# Patient Record
Sex: Female | Born: 1955 | Race: Black or African American | Hispanic: No | Smoking: Never smoker
Health system: Southern US, Community
[De-identification: ages and names within clinical notes are randomized; demographics above are authoritative.]

## PROBLEM LIST (undated history)

## (undated) DIAGNOSIS — IMO0002 Reserved for concepts with insufficient information to code with codable children: Secondary | ICD-10-CM

## (undated) DIAGNOSIS — L0232 Furuncle of buttock: Secondary | ICD-10-CM

## (undated) DIAGNOSIS — A599 Trichomoniasis, unspecified: Secondary | ICD-10-CM

## (undated) DIAGNOSIS — Z8719 Personal history of other diseases of the digestive system: Secondary | ICD-10-CM

## (undated) DIAGNOSIS — A0472 Enterocolitis due to Clostridium difficile, not specified as recurrent: Secondary | ICD-10-CM

## (undated) DIAGNOSIS — I499 Cardiac arrhythmia, unspecified: Secondary | ICD-10-CM

## (undated) DIAGNOSIS — L309 Dermatitis, unspecified: Secondary | ICD-10-CM

## (undated) DIAGNOSIS — R1032 Left lower quadrant pain: Secondary | ICD-10-CM

## (undated) DIAGNOSIS — R350 Frequency of micturition: Secondary | ICD-10-CM

## (undated) DIAGNOSIS — L0231 Cutaneous abscess of buttock: Secondary | ICD-10-CM

## (undated) DIAGNOSIS — G4730 Sleep apnea, unspecified: Secondary | ICD-10-CM

## (undated) DIAGNOSIS — C50919 Malignant neoplasm of unspecified site of unspecified female breast: Secondary | ICD-10-CM

## (undated) DIAGNOSIS — E785 Hyperlipidemia, unspecified: Secondary | ICD-10-CM

## (undated) DIAGNOSIS — T451X5A Adverse effect of antineoplastic and immunosuppressive drugs, initial encounter: Secondary | ICD-10-CM

## (undated) DIAGNOSIS — R32 Unspecified urinary incontinence: Secondary | ICD-10-CM

## (undated) DIAGNOSIS — K469 Unspecified abdominal hernia without obstruction or gangrene: Secondary | ICD-10-CM

## (undated) DIAGNOSIS — I82409 Acute embolism and thrombosis of unspecified deep veins of unspecified lower extremity: Secondary | ICD-10-CM

## (undated) DIAGNOSIS — R519 Headache, unspecified: Secondary | ICD-10-CM

## (undated) DIAGNOSIS — R21 Rash and other nonspecific skin eruption: Secondary | ICD-10-CM

## (undated) DIAGNOSIS — I10 Essential (primary) hypertension: Secondary | ICD-10-CM

## (undated) DIAGNOSIS — Z87898 Personal history of other specified conditions: Secondary | ICD-10-CM

## (undated) DIAGNOSIS — C541 Malignant neoplasm of endometrium: Secondary | ICD-10-CM

## (undated) DIAGNOSIS — Z86010 Personal history of colonic polyps: Secondary | ICD-10-CM

## (undated) DIAGNOSIS — T7840XA Allergy, unspecified, initial encounter: Secondary | ICD-10-CM

## (undated) DIAGNOSIS — E611 Iron deficiency: Secondary | ICD-10-CM

## (undated) DIAGNOSIS — G4733 Obstructive sleep apnea (adult) (pediatric): Secondary | ICD-10-CM

## (undated) DIAGNOSIS — R51 Headache: Secondary | ICD-10-CM

## (undated) DIAGNOSIS — R7303 Prediabetes: Secondary | ICD-10-CM

## (undated) DIAGNOSIS — K219 Gastro-esophageal reflux disease without esophagitis: Secondary | ICD-10-CM

## (undated) DIAGNOSIS — N6459 Other signs and symptoms in breast: Secondary | ICD-10-CM

## (undated) DIAGNOSIS — G629 Polyneuropathy, unspecified: Secondary | ICD-10-CM

## (undated) DIAGNOSIS — T148XXA Other injury of unspecified body region, initial encounter: Secondary | ICD-10-CM

## (undated) DIAGNOSIS — A6000 Herpesviral infection of urogenital system, unspecified: Secondary | ICD-10-CM

## (undated) DIAGNOSIS — IMO0001 Reserved for inherently not codable concepts without codable children: Secondary | ICD-10-CM

## (undated) DIAGNOSIS — E119 Type 2 diabetes mellitus without complications: Secondary | ICD-10-CM

## (undated) DIAGNOSIS — Z803 Family history of malignant neoplasm of breast: Secondary | ICD-10-CM

## (undated) DIAGNOSIS — Z8619 Personal history of other infectious and parasitic diseases: Secondary | ICD-10-CM

## (undated) DIAGNOSIS — M1990 Unspecified osteoarthritis, unspecified site: Secondary | ICD-10-CM

## (undated) DIAGNOSIS — I509 Heart failure, unspecified: Secondary | ICD-10-CM

## (undated) DIAGNOSIS — N920 Excessive and frequent menstruation with regular cycle: Secondary | ICD-10-CM

## (undated) DIAGNOSIS — D649 Anemia, unspecified: Secondary | ICD-10-CM

## (undated) DIAGNOSIS — A499 Bacterial infection, unspecified: Secondary | ICD-10-CM

## (undated) DIAGNOSIS — N318 Other neuromuscular dysfunction of bladder: Secondary | ICD-10-CM

## (undated) DIAGNOSIS — I427 Cardiomyopathy due to drug and external agent: Secondary | ICD-10-CM

## (undated) HISTORY — DX: Trichomoniasis, unspecified: A59.9

## (undated) HISTORY — DX: Personal history of other infectious and parasitic diseases: Z86.19

## (undated) HISTORY — DX: Sleep apnea, unspecified: G47.30

## (undated) HISTORY — DX: Other signs and symptoms in breast: N64.59

## (undated) HISTORY — DX: Malignant neoplasm of unspecified site of unspecified female breast: C50.919

## (undated) HISTORY — DX: Hyperlipidemia, unspecified: E78.5

## (undated) HISTORY — DX: Allergy, unspecified, initial encounter: T78.40XA

## (undated) HISTORY — DX: Family history of malignant neoplasm of breast: Z80.3

## (undated) HISTORY — DX: Type 2 diabetes mellitus without complications: E11.9

## (undated) HISTORY — DX: Enterocolitis due to Clostridium difficile, not specified as recurrent: A04.72

## (undated) HISTORY — DX: Bacterial infection, unspecified: A49.9

## (undated) HISTORY — DX: Cutaneous abscess of buttock: L02.31

## (undated) HISTORY — DX: Personal history of colonic polyps: Z86.010

## (undated) HISTORY — DX: Personal history of other diseases of the digestive system: Z87.19

## (undated) HISTORY — DX: Herpesviral infection of urogenital system, unspecified: A60.00

## (undated) HISTORY — DX: Prediabetes: R73.03

## (undated) HISTORY — PX: JOINT REPLACEMENT: SHX530

## (undated) HISTORY — DX: Unspecified abdominal hernia without obstruction or gangrene: K46.9

## (undated) HISTORY — PX: OTHER SURGICAL HISTORY: SHX169

## (undated) HISTORY — DX: Rash and other nonspecific skin eruption: R21

## (undated) HISTORY — DX: Polyneuropathy, unspecified: G62.9

## (undated) HISTORY — DX: Iron deficiency: E61.1

## (undated) HISTORY — DX: Essential (primary) hypertension: I10

## (undated) HISTORY — DX: Personal history of other specified conditions: Z87.898

## (undated) HISTORY — PX: DILATION AND CURETTAGE OF UTERUS: SHX78

## (undated) HISTORY — DX: Excessive and frequent menstruation with regular cycle: N92.0

## (undated) HISTORY — PX: ABDOMINAL HYSTERECTOMY: SHX81

## (undated) HISTORY — DX: Obstructive sleep apnea (adult) (pediatric): G47.33

## (undated) HISTORY — PX: COLONOSCOPY: SHX174

## (undated) HISTORY — DX: Left lower quadrant pain: R10.32

## (undated) HISTORY — PX: BREAST LUMPECTOMY: SHX2

## (undated) HISTORY — PX: AXILLARY LYMPH NODE DISSECTION: SHX5229

## (undated) HISTORY — DX: Malignant neoplasm of endometrium: C54.1

## (undated) HISTORY — DX: Other neuromuscular dysfunction of bladder: N31.8

---
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
## 1999-12-30 ENCOUNTER — Other Ambulatory Visit: Admission: RE | Admit: 1999-12-30 | Discharge: 1999-12-30 | Payer: Self-pay | Admitting: Internal Medicine

## 2002-10-06 ENCOUNTER — Ambulatory Visit (HOSPITAL_BASED_OUTPATIENT_CLINIC_OR_DEPARTMENT_OTHER): Admission: RE | Admit: 2002-10-06 | Discharge: 2002-10-06 | Payer: Self-pay | Admitting: Internal Medicine

## 2002-10-06 ENCOUNTER — Encounter: Payer: Self-pay | Admitting: Pulmonary Disease

## 2004-10-19 HISTORY — PX: UTERINE FIBROID SURGERY: SHX826

## 2004-10-19 HISTORY — PX: OTHER SURGICAL HISTORY: SHX169

## 2004-12-05 ENCOUNTER — Ambulatory Visit: Payer: Self-pay | Admitting: Internal Medicine

## 2004-12-10 ENCOUNTER — Ambulatory Visit (HOSPITAL_COMMUNITY): Admission: RE | Admit: 2004-12-10 | Discharge: 2004-12-10 | Payer: Self-pay | Admitting: Internal Medicine

## 2005-01-05 ENCOUNTER — Ambulatory Visit: Payer: Self-pay | Admitting: Pulmonary Disease

## 2005-01-20 ENCOUNTER — Ambulatory Visit (HOSPITAL_COMMUNITY): Admission: RE | Admit: 2005-01-20 | Discharge: 2005-01-20 | Payer: Self-pay | Admitting: Obstetrics and Gynecology

## 2005-03-26 ENCOUNTER — Other Ambulatory Visit: Admission: RE | Admit: 2005-03-26 | Discharge: 2005-03-26 | Payer: Self-pay | Admitting: Obstetrics and Gynecology

## 2005-05-22 ENCOUNTER — Ambulatory Visit (HOSPITAL_COMMUNITY): Admission: RE | Admit: 2005-05-22 | Discharge: 2005-05-22 | Payer: Self-pay | Admitting: Obstetrics and Gynecology

## 2005-06-05 ENCOUNTER — Ambulatory Visit (HOSPITAL_COMMUNITY): Admission: RE | Admit: 2005-06-05 | Discharge: 2005-06-05 | Payer: Self-pay | Admitting: Obstetrics and Gynecology

## 2005-08-21 ENCOUNTER — Inpatient Hospital Stay (HOSPITAL_COMMUNITY): Admission: RE | Admit: 2005-08-21 | Discharge: 2005-08-23 | Payer: Self-pay | Admitting: General Surgery

## 2005-08-21 ENCOUNTER — Encounter (INDEPENDENT_AMBULATORY_CARE_PROVIDER_SITE_OTHER): Payer: Self-pay | Admitting: Specialist

## 2005-10-19 HISTORY — PX: OTHER SURGICAL HISTORY: SHX169

## 2006-01-12 ENCOUNTER — Ambulatory Visit: Payer: Self-pay | Admitting: Internal Medicine

## 2006-01-27 ENCOUNTER — Ambulatory Visit: Payer: Self-pay | Admitting: Gastroenterology

## 2006-02-05 ENCOUNTER — Ambulatory Visit: Payer: Self-pay | Admitting: Gastroenterology

## 2006-02-08 ENCOUNTER — Ambulatory Visit: Payer: Self-pay | Admitting: Gastroenterology

## 2006-02-12 ENCOUNTER — Ambulatory Visit: Payer: Self-pay | Admitting: Gastroenterology

## 2006-02-12 LAB — HM COLONOSCOPY: HM Colonoscopy: NORMAL

## 2006-02-26 ENCOUNTER — Ambulatory Visit: Payer: Self-pay | Admitting: Gastroenterology

## 2006-06-18 ENCOUNTER — Ambulatory Visit: Payer: Self-pay | Admitting: Internal Medicine

## 2006-09-16 ENCOUNTER — Inpatient Hospital Stay (HOSPITAL_COMMUNITY): Admission: RE | Admit: 2006-09-16 | Discharge: 2006-09-20 | Payer: Self-pay | Admitting: Specialist

## 2007-03-10 ENCOUNTER — Ambulatory Visit: Payer: Self-pay | Admitting: Internal Medicine

## 2007-03-10 LAB — CONVERTED CEMR LAB
ALT: 16 units/L (ref 0–40)
AST: 22 units/L (ref 0–37)
Albumin: 3.5 g/dL (ref 3.5–5.2)
Alkaline Phosphatase: 52 units/L (ref 39–117)
BUN: 9 mg/dL (ref 6–23)
Basophils Absolute: 0 10*3/uL (ref 0.0–0.1)
Basophils Relative: 0.4 % (ref 0.0–1.0)
Bilirubin Urine: NEGATIVE
Bilirubin, Direct: 0.2 mg/dL (ref 0.0–0.3)
CO2: 31 meq/L (ref 19–32)
Calcium: 9.4 mg/dL (ref 8.4–10.5)
Chloride: 103 meq/L (ref 96–112)
Cholesterol: 193 mg/dL (ref 0–200)
Creatinine, Ser: 0.8 mg/dL (ref 0.4–1.2)
Crystals: NEGATIVE
Eosinophils Absolute: 0.1 10*3/uL (ref 0.0–0.6)
Eosinophils Relative: 1.6 % (ref 0.0–5.0)
GFR calc Af Amer: 98 mL/min
GFR calc non Af Amer: 81 mL/min
Glucose, Bld: 103 mg/dL — ABNORMAL HIGH (ref 70–99)
HCT: 33.8 % — ABNORMAL LOW (ref 36.0–46.0)
HDL: 61.2 mg/dL (ref >39.0)
Hemoglobin: 11.4 g/dL — ABNORMAL LOW (ref 12.0–15.0)
Ketones, ur: NEGATIVE mg/dL
LDL Cholesterol: 118 mg/dL — ABNORMAL HIGH (ref 0–99)
Leukocytes, UA: NEGATIVE
Lymphocytes Relative: 28 % (ref 12.0–46.0)
MCHC: 33.7 g/dL (ref 30.0–36.0)
MCV: 85.4 fL (ref 78.0–100.0)
Monocytes Absolute: 0.5 10*3/uL (ref 0.2–0.7)
Monocytes Relative: 8.9 % (ref 3.0–11.0)
Neutro Abs: 3.4 10*3/uL (ref 1.4–7.7)
Neutrophils Relative %: 61.1 % (ref 43.0–77.0)
Nitrite: NEGATIVE
Platelets: 328 10*3/uL (ref 150–400)
Potassium: 3.9 meq/L (ref 3.5–5.1)
RBC: 3.96 M/uL (ref 3.87–5.11)
RDW: 13.8 % (ref 11.5–14.6)
Sodium: 139 meq/L (ref 135–145)
Specific Gravity, Urine: 1.02 (ref 1.000–1.03)
TSH: 1.47 microintl units/mL (ref 0.35–5.50)
Total Bilirubin: 0.6 mg/dL (ref 0.3–1.2)
Total CHOL/HDL Ratio: 3.2
Total Protein, Urine: NEGATIVE mg/dL
Total Protein: 7.9 g/dL (ref 6.0–8.3)
Triglycerides: 70 mg/dL (ref 0–149)
Urine Glucose: NEGATIVE mg/dL
Urobilinogen, UA: 0.2 (ref 0.0–1.0)
VLDL: 14 mg/dL (ref 0–40)
WBC: 5.6 10*3/uL (ref 4.5–10.5)
pH: 6 (ref 5.0–8.0)

## 2007-03-11 ENCOUNTER — Encounter: Payer: Self-pay | Admitting: Internal Medicine

## 2007-04-13 ENCOUNTER — Encounter: Admission: RE | Admit: 2007-04-13 | Discharge: 2007-04-13 | Payer: Self-pay | Admitting: Specialist

## 2008-05-11 ENCOUNTER — Ambulatory Visit: Payer: Self-pay | Admitting: Internal Medicine

## 2008-05-11 ENCOUNTER — Encounter (INDEPENDENT_AMBULATORY_CARE_PROVIDER_SITE_OTHER): Payer: Self-pay | Admitting: *Deleted

## 2008-05-11 DIAGNOSIS — E785 Hyperlipidemia, unspecified: Secondary | ICD-10-CM | POA: Insufficient documentation

## 2008-05-11 DIAGNOSIS — Z860100 Personal history of colon polyps, unspecified: Secondary | ICD-10-CM

## 2008-05-11 DIAGNOSIS — D509 Iron deficiency anemia, unspecified: Secondary | ICD-10-CM | POA: Insufficient documentation

## 2008-05-11 DIAGNOSIS — G4733 Obstructive sleep apnea (adult) (pediatric): Secondary | ICD-10-CM

## 2008-05-11 DIAGNOSIS — K219 Gastro-esophageal reflux disease without esophagitis: Secondary | ICD-10-CM | POA: Insufficient documentation

## 2008-05-11 HISTORY — DX: Personal history of colonic polyps: Z86.010

## 2008-05-11 HISTORY — DX: Personal history of colon polyps, unspecified: Z86.0100

## 2008-05-11 HISTORY — DX: Hyperlipidemia, unspecified: E78.5

## 2008-05-11 HISTORY — DX: Obstructive sleep apnea (adult) (pediatric): G47.33

## 2008-05-11 LAB — CONVERTED CEMR LAB
ALT: 20 units/L (ref 0–35)
ALT: 20 units/L (ref 0–35)
AST: 22 units/L (ref 0–37)
AST: 22 units/L (ref 0–37)
Albumin: 3.8 g/dL (ref 3.5–5.2)
Albumin: 3.8 g/dL (ref 3.5–5.2)
Alkaline Phosphatase: 43 units/L (ref 39–117)
Alkaline Phosphatase: 43 units/L (ref 39–117)
BUN: 9 mg/dL (ref 6–23)
BUN: 9 mg/dL (ref 6–23)
Basophils Absolute: 0.1 10*3/uL (ref 0.0–0.1)
Basophils Absolute: 0.1 10*3/uL (ref 0.0–0.1)
Basophils Relative: 1 % (ref 0.0–3.0)
Basophils Relative: 1 % (ref 0.0–3.0)
Bilirubin Urine: NEGATIVE
Bilirubin Urine: NEGATIVE
Bilirubin, Direct: 0.1 mg/dL (ref 0.0–0.3)
Bilirubin, Direct: 0.1 mg/dL (ref 0.0–0.3)
CO2: 28 meq/L (ref 19–32)
CO2: 28 meq/L (ref 19–32)
Calcium: 9 mg/dL (ref 8.4–10.5)
Calcium: 9 mg/dL (ref 8.4–10.5)
Chloride: 101 meq/L (ref 96–112)
Chloride: 101 meq/L (ref 96–112)
Cholesterol: 168 mg/dL (ref 0–200)
Cholesterol: 168 mg/dL (ref 0–200)
Creatinine, Ser: 0.7 mg/dL (ref 0.4–1.2)
Creatinine, Ser: 0.7 mg/dL (ref 0.4–1.2)
Crystals: NEGATIVE
Eosinophils Absolute: 0.1 10*3/uL (ref 0.0–0.7)
Eosinophils Absolute: 0.1 10*3/uL (ref 0.0–0.7)
Eosinophils Relative: 2.3 % (ref 0.0–5.0)
Eosinophils Relative: 2.3 % (ref 0.0–5.0)
GFR calc Af Amer: 114 mL/min
GFR calc Af Amer: 114 mL/min
GFR calc non Af Amer: 95 mL/min
GFR calc non Af Amer: 95 mL/min
Glucose, Bld: 87 mg/dL (ref 70–99)
Glucose, Bld: 87 mg/dL (ref 70–99)
HCT: 32.6 % — ABNORMAL LOW (ref 36.0–46.0)
HCT: 32.6 % — ABNORMAL LOW (ref 36.0–46.0)
HDL: 48.7 mg/dL (ref >39.0)
HDL: 48.7 mg/dL (ref >39.0)
Hemoglobin, Urine: NEGATIVE
Hemoglobin: 10.8 g/dL — ABNORMAL LOW (ref 12.0–15.0)
Hemoglobin: 10.8 g/dL — ABNORMAL LOW (ref 12.0–15.0)
Ketones, ur: NEGATIVE mg/dL
Ketones, ur: NEGATIVE mg/dL
LDL Cholesterol: 99 mg/dL (ref 0–99)
LDL Cholesterol: 99 mg/dL (ref 0–99)
Leukocytes, UA: NEGATIVE
Leukocytes, UA: NEGATIVE
Lymphocytes Relative: 33 % (ref 12.0–46.0)
Lymphocytes Relative: 33 % (ref 12.0–46.0)
MCHC: 33.1 g/dL (ref 30.0–36.0)
MCHC: 33.1 g/dL (ref 30.0–36.0)
MCV: 90 fL (ref 78.0–100.0)
MCV: 90 fL (ref 78.0–100.0)
Monocytes Absolute: 0.9 10*3/uL (ref 0.1–1.0)
Monocytes Absolute: 0.9 10*3/uL (ref 0.1–1.0)
Monocytes Relative: 13.5 % — ABNORMAL HIGH (ref 3.0–12.0)
Monocytes Relative: 13.5 % — ABNORMAL HIGH (ref 3.0–12.0)
Mucus, UA: NEGATIVE
Neutro Abs: 3.2 10*3/uL (ref 1.4–7.7)
Neutro Abs: 3.2 10*3/uL (ref 1.4–7.7)
Neutrophils Relative %: 50.2 % (ref 43.0–77.0)
Neutrophils Relative %: 50.2 % (ref 43.0–77.0)
Nitrite: NEGATIVE
Nitrite: NEGATIVE
Platelets: 296 10*3/uL (ref 150–400)
Platelets: 296 10*3/uL (ref 150–400)
Potassium: 3.6 meq/L (ref 3.5–5.1)
Potassium: 3.6 meq/L (ref 3.5–5.1)
RBC: 3.63 M/uL — ABNORMAL LOW (ref 3.87–5.11)
RBC: 3.63 M/uL — ABNORMAL LOW (ref 3.87–5.11)
RDW: 13 % (ref 11.5–14.6)
RDW: 13 % (ref 11.5–14.6)
Sodium: 139 meq/L (ref 135–145)
Sodium: 139 meq/L (ref 135–145)
Specific Gravity, Urine: 1.02 (ref 1.000–1.03)
Specific Gravity, Urine: 1.02 (ref 1.000–1.03)
TSH: 1.31 microintl units/mL (ref 0.35–5.50)
TSH: 1.31 microintl units/mL (ref 0.35–5.50)
Total Bilirubin: 0.5 mg/dL (ref 0.3–1.2)
Total Bilirubin: 0.5 mg/dL (ref 0.3–1.2)
Total CHOL/HDL Ratio: 3.4
Total CHOL/HDL Ratio: 3.4
Total Protein, Urine: NEGATIVE mg/dL
Total Protein, Urine: NEGATIVE mg/dL
Total Protein: 7.9 g/dL (ref 6.0–8.3)
Total Protein: 7.9 g/dL (ref 6.0–8.3)
Triglycerides: 102 mg/dL (ref 0–149)
Triglycerides: 102 mg/dL (ref 0–149)
Urine Glucose: NEGATIVE mg/dL
Urine Glucose: NEGATIVE mg/dL
Urobilinogen, UA: 1 (ref 0.0–1.0)
Urobilinogen, UA: 1 (ref 0.0–1.0)
VLDL: 20 mg/dL (ref 0–40)
VLDL: 20 mg/dL (ref 0–40)
WBC: 6.4 10*3/uL (ref 4.5–10.5)
WBC: 6.4 10*3/uL (ref 4.5–10.5)
pH: 6 (ref 5.0–8.0)
pH: 6 (ref 5.0–8.0)

## 2008-05-14 LAB — CONVERTED CEMR LAB: Vit D, 1,25-Dihydroxy: 19 — ABNORMAL LOW (ref 30–89)

## 2008-06-29 ENCOUNTER — Ambulatory Visit: Payer: Self-pay | Admitting: Internal Medicine

## 2008-06-29 DIAGNOSIS — R21 Rash and other nonspecific skin eruption: Secondary | ICD-10-CM

## 2008-06-29 DIAGNOSIS — N318 Other neuromuscular dysfunction of bladder: Secondary | ICD-10-CM | POA: Insufficient documentation

## 2008-06-29 HISTORY — DX: Other neuromuscular dysfunction of bladder: N31.8

## 2008-06-29 HISTORY — DX: Rash and other nonspecific skin eruption: R21

## 2008-08-13 LAB — HM MAMMOGRAPHY: HM Mammogram: NORMAL

## 2008-11-30 ENCOUNTER — Encounter: Admission: RE | Admit: 2008-11-30 | Discharge: 2008-11-30 | Payer: Self-pay | Admitting: Obstetrics and Gynecology

## 2009-01-24 ENCOUNTER — Encounter: Payer: Self-pay | Admitting: Internal Medicine

## 2009-02-28 ENCOUNTER — Telehealth: Payer: Self-pay | Admitting: Cardiovascular Disease

## 2009-07-26 ENCOUNTER — Ambulatory Visit: Payer: Self-pay | Admitting: Internal Medicine

## 2009-07-30 ENCOUNTER — Telehealth: Payer: Self-pay | Admitting: Internal Medicine

## 2009-07-30 ENCOUNTER — Ambulatory Visit: Payer: Self-pay | Admitting: Internal Medicine

## 2009-07-30 LAB — CONVERTED CEMR LAB
ALT: 17 units/L (ref 0–35)
AST: 19 units/L (ref 0–37)
Albumin: 3.6 g/dL (ref 3.5–5.2)
Alkaline Phosphatase: 41 units/L (ref 39–117)
BUN: 10 mg/dL (ref 6–23)
Basophils Absolute: 0 10*3/uL (ref 0.0–0.1)
Basophils Relative: 0.6 % (ref 0.0–3.0)
Bilirubin Urine: NEGATIVE
Bilirubin, Direct: 0.1 mg/dL (ref 0.0–0.3)
CO2: 22 meq/L (ref 19–32)
Calcium: 9.2 mg/dL (ref 8.4–10.5)
Chloride: 105 meq/L (ref 96–112)
Cholesterol: 158 mg/dL (ref 0–200)
Creatinine, Ser: 0.7 mg/dL (ref 0.4–1.2)
Eosinophils Absolute: 0.1 10*3/uL (ref 0.0–0.7)
Eosinophils Relative: 1.3 % (ref 0.0–5.0)
GFR calc non Af Amer: 113.53 mL/min (ref >60)
Glucose, Bld: 86 mg/dL (ref 70–99)
HCT: 32.9 % — ABNORMAL LOW (ref 36.0–46.0)
HDL: 57.7 mg/dL (ref >39.00)
Hemoglobin: 10.9 g/dL — ABNORMAL LOW (ref 12.0–15.0)
Ketones, ur: NEGATIVE mg/dL
LDL Cholesterol: 87 mg/dL (ref 0–99)
Leukocytes, UA: NEGATIVE
Lymphocytes Relative: 27.8 % (ref 12.0–46.0)
Lymphs Abs: 1.8 10*3/uL (ref 0.7–4.0)
MCHC: 33.1 g/dL (ref 30.0–36.0)
MCV: 86.8 fL (ref 78.0–100.0)
Monocytes Absolute: 0.6 10*3/uL (ref 0.1–1.0)
Monocytes Relative: 8.8 % (ref 3.0–12.0)
Neutro Abs: 3.9 10*3/uL (ref 1.4–7.7)
Neutrophils Relative %: 61.5 % (ref 43.0–77.0)
Nitrite: NEGATIVE
Platelets: 270 10*3/uL (ref 150.0–400.0)
Potassium: 4.3 meq/L (ref 3.5–5.1)
RBC: 3.79 M/uL — ABNORMAL LOW (ref 3.87–5.11)
RDW: 14.2 % (ref 11.5–14.6)
Sodium: 140 meq/L (ref 135–145)
Specific Gravity, Urine: 1.025 (ref 1.000–1.030)
TSH: 1.73 microintl units/mL (ref 0.35–5.50)
Total Bilirubin: 0.6 mg/dL (ref 0.3–1.2)
Total CHOL/HDL Ratio: 3
Total Protein, Urine: NEGATIVE mg/dL
Total Protein: 7.2 g/dL (ref 6.0–8.3)
Triglycerides: 65 mg/dL (ref 0.0–149.0)
Urine Glucose: NEGATIVE mg/dL
Urobilinogen, UA: 0.2 (ref 0.0–1.0)
VLDL: 13 mg/dL (ref 0.0–40.0)
WBC: 6.4 10*3/uL (ref 4.5–10.5)
pH: 5.5 (ref 5.0–8.0)

## 2009-07-31 LAB — CONVERTED CEMR LAB
Folate: 7.4 ng/mL
Iron: 46 ug/dL (ref 42–145)
Saturation Ratios: 13.5 % — ABNORMAL LOW (ref 20.0–50.0)
Transferrin: 243.6 mg/dL (ref 212.0–360.0)
Vitamin B-12: 459 pg/mL (ref 211–911)

## 2009-08-08 ENCOUNTER — Ambulatory Visit: Payer: Self-pay | Admitting: Pulmonary Disease

## 2009-08-08 DIAGNOSIS — Z87898 Personal history of other specified conditions: Secondary | ICD-10-CM

## 2009-08-08 HISTORY — DX: Personal history of other specified conditions: Z87.898

## 2009-09-06 ENCOUNTER — Ambulatory Visit: Payer: Self-pay | Admitting: Pulmonary Disease

## 2009-10-26 ENCOUNTER — Encounter: Payer: Self-pay | Admitting: Pulmonary Disease

## 2009-12-16 ENCOUNTER — Ambulatory Visit: Payer: Self-pay | Admitting: Pulmonary Disease

## 2009-12-25 DIAGNOSIS — N920 Excessive and frequent menstruation with regular cycle: Secondary | ICD-10-CM | POA: Insufficient documentation

## 2010-10-19 DIAGNOSIS — Z171 Estrogen receptor negative status [ER-]: Secondary | ICD-10-CM | POA: Insufficient documentation

## 2010-10-19 DIAGNOSIS — C50919 Malignant neoplasm of unspecified site of unspecified female breast: Secondary | ICD-10-CM

## 2010-10-19 DIAGNOSIS — C50412 Malignant neoplasm of upper-outer quadrant of left female breast: Secondary | ICD-10-CM | POA: Insufficient documentation

## 2010-10-19 HISTORY — DX: Malignant neoplasm of unspecified site of unspecified female breast: C50.919

## 2010-11-09 ENCOUNTER — Encounter: Payer: Self-pay | Admitting: Obstetrics and Gynecology

## 2010-11-16 LAB — CONVERTED CEMR LAB
Bilirubin Urine: NEGATIVE
Crystals: NEGATIVE
Ketones, ur: NEGATIVE mg/dL
Leukocytes, UA: NEGATIVE
Mucus, UA: NEGATIVE
Nitrite: NEGATIVE
Pap Smear: NORMAL
Specific Gravity, Urine: >=1.03 (ref 1.000–1.03)
Total Protein, Urine: NEGATIVE mg/dL
Urine Glucose: NEGATIVE mg/dL
Urobilinogen, UA: 0.2 (ref 0.0–1.0)
pH: 5 (ref 5.0–8.0)

## 2010-11-18 NOTE — Assessment & Plan Note (Signed)
Summary: rov for osa   Copy to:  Oliver Barre Primary Provider/Referring Provider:  [name] MD  CC:  Pt is here for a 1 month f/u appt.  Pt states she is using her cpap machine most nights.  Approx 6 hours per night. Pt states she will return auto machine to [facility] today to get download.  Pt denied any complaints with mask. Pt c/o humidifier causing too much condensation. [name]  History of Present Illness: The pt comes in today for f/u of her osa.  She is trying to wear cpap everynight, but misses some.  She is having humidity issues with her current setup, but I have told her to decrease heater setting for decreased moisture.  When she wears the device for a more prolonged period of time, she thinks she does sleep better with improved daytime alertness.  Medications Prior to Update: 1)  Micardis Hct 80-25 Mg Tabs (Telmisartan-Hctz) .... Take 1 Tablet By Mouth Once A Day 2)  Valtrex 1 Gm  Tabs (Valacyclovir Hcl) .[name].. 1 By Mouth Once Daily 3)  Iron 325 (65 Fe) Mg Tabs (Ferrous Sulfate) .[name].. 1 By Mouth Once Daily  Allergies (verified): 1)  ! Codeine 2)  ! Darvon  Review of Systems      See HPI  Vital Signs:  Patient profile:   55 year old female Height:      60 inches Weight:      241.13 pounds O2 Sat:      100 % on Room air Temp:     98.0 degrees F oral Pulse rate:   70 / minute BP sitting:   116 / 78  (left arm) Cuff size:   large  Vitals Entered By: Arman Filter LPN (September 06, 2009 9:17 AM)  O2 Flow:  Room air CC: Pt is here for a 1 month f/u appt.  Pt states she is using her cpap machine most nights.  Approx 6 hours per night. Pt states she will return auto machine to [facility] today to get download.  Pt denied any complaints with mask. Pt c/o humidifier causing too much condensation.  Comments Medications reviewed with patient Arman Filter LPN  September 06, 2009 9:18 AM    Physical Exam  General:  obese female in nad Nose:  no skin breakdown or pressure necrosis from cpap  mask Extremities:  mild edema Neurologic:  alert , mildly sleepy, and moves all 4.   Impression & Recommendations:  Problem # 1:  OBSTRUCTIVE SLEEP APNEA (ICD-327.23) The pt is trying to increase her compliance with the cpap device, but admits that she needs to try harder.  She thinks it does help her sleep and daytime alertness when she wears for longer periods of time.  She currently is on an auto, and we need to get her download sent over to optimize her pressure.  She is going to work toward better compliance, as well as weight loss.  Time spent with pt today was .  Other Orders: Est. Patient Level III (45409) DME Referral (DME)  Patient Instructions: 1)  will get auto download and let you know your optimal pressure 2)  turn heater down on humidifier to decrease moisture 3)  work on weight loss 4)  call me in 4 weeks to let me know how things are going.  Call me sooner if issues with tolerance that I can help you with.

## 2010-11-18 NOTE — Letter (Signed)
Summary: Umbilical Hernia  Umbilical Hernia   Imported By: Sherian Rein 02/28/2009 09:21:34  _____________________________________________________________________  External Attachment:    Type:   Image     Comment:   External Document

## 2010-11-18 NOTE — Assessment & Plan Note (Signed)
Summary: cpx / # / cd   Vital Signs:  Patient profile:   55 year old female Height:      60 inches Weight:      237 pounds BMI:     46.45 O2 Sat:      99 % on Room air Temp:     98.1 degrees F oral Pulse rate:   80 / minute Pulse rhythm:   regular BP sitting:   116 / 78  (left arm) Cuff size:   large  Vitals Entered By: Rock Nephew CMA (July 30, 2009 1:18 PM)  O2 Flow:  Room air  Preventive Care Screening  Last Tetanus Booster:    Date:  07/30/2009    Results:  Tdap   Mammogram:    Date:  08/13/2008    Results:  normal   Pap Smear:    Date:  08/13/2008    Results:  normal   CC: cpx-- pt no longer takes detrol   CC:  cpx-- pt no longer takes detrol.  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 Take 1 Tablet By Mouth Once A Day--No Addtnl Refills Pt Overdue For An Appt 2)  Valtrex 1 Gm  Tabs (Valacyclovir Hcl) .[name].. 1 By Mouth Once Daily 3)  Vitamin D3 1000 Unit  Tabs (Cholecalciferol) .[name].. 1 By Mouth Once Daily 4)  Lotrisone 1-0.05 % Crea (Clotrimazole-Betamethasone) .... Use Asd Two Times A Day As Needed 5)  Detrol La 4 Mg Xr24h-Cap (Tolterodine Tartrate) .[name].. 1 By Mouth  Once Daily  Current Medications (verified): 1)  Micardis Hct 80-25 Mg Tabs (Telmisartan-Hctz) .... Take 1 Tablet By Mouth Once A Day 2)  Valtrex 1 Gm  Tabs (Valacyclovir Hcl) .[name].. 1 By Mouth Once Daily 3)  Vitamin D3 1000 Unit  Tabs (Cholecalciferol) .[name].. 1 By Mouth Once Daily 4)  Iron 325 (65 Fe) Mg Tabs (Ferrous Sulfate) .[name].. 1 By Mouth Once Daily  Allergies (verified): 1)  ! Codeine 2)  ! 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         all otherwise negative per pt   Physical Exam  General:  alert and overweight-appearing.   Head:  normocephalic and atraumatic.   Eyes:  vision grossly intact, pupils equal, and pupils round.   Ears:  R ear normal and L ear normal.   Nose:  no external deformity and no nasal discharge.   Mouth:  no gingival abnormalities and  pharynx pink and moist.   Neck:  supple and no masses.   Lungs:  normal respiratory effort and normal breath sounds.   Heart:  normal rate and regular rhythm.   Abdomen:  soft, non-tender, and normal bowel sounds.   Msk:  no joint tenderness and no joint swelling.   Extremities:  no edema, no erythema  Neurologic:  cranial nerves II-XII intact and strength normal in all extremities.     Impression & Recommendations:  Problem # 1:  Preventive Health Care (ICD-V70.0)  Overall doing well, up to date, counseled on routine health concerns for screening and prevention, immunizations up to date or declined, labs reviewed, ecg reviewed   Orders: EKG w/ Interpretation (93000)  Problem # 2:  ANEMIA-IRON DEFICIENCY (ICD-280.9)  chronic, noncompliant with iron, liekly due to menorrhagia; to start iron 325 once daily, f/u gyn  Her updated medication list for this problem includes:    Iron 325 (65 Fe) Mg Tabs (Ferrous sulfate) .[name]... 1 by mouth once daily  Problem # 3:  OBSTRUCTIVE SLEEP APNEA (ICD-327.23) she is receptive to tx now - refer pulm Orders: Pulmonary Referral (Pulmonary)  Complete Medication List: 1)  Micardis Hct 80-25 Mg Tabs (Telmisartan-hctz) .... Take 1 tablet by mouth once a day 2)  Valtrex 1 Gm Tabs (Valacyclovir hcl) .[name].. 1 by mouth once daily 3)  Vitamin D3 1000 Unit Tabs (Cholecalciferol) .[name].. 1 by mouth once daily 4)  Iron 325 (65 Fe) Mg Tabs (Ferrous sulfate) .[name].. 1 by mouth once daily  Other Orders: Admin 1st Vaccine (16109) Flu Vaccine 71yrs + (60454) Tdap => 1yrs IM (09811) Admin of Any Addtl Vaccine (91478) Flu Vaccine Consent Questions     Do you have a history of severe allergic reactions to this vaccine? no    Any prior history of allergic reactions to egg and/or gelatin? no    Do you have a sensitivity to the preservative Thimersol? no    Do you have a past history of Guillan-Barre Syndrome? no    Do you currently have an acute febrile illness? no     Have you ever had a severe reaction to latex? no    Vaccine information given and explained to patient? yes    Are you currently pregnant? no    Lot Number:[id number]   Exp Date:04/17/2010   Site Given  Left Deltoid IM Flu Vaccine 23yrs + (29562)  Patient Instructions: 1)  you had the tetanus and the flu shots today 2)  start the iron TOC 325 mg - 1 per day 3)  see your GYN about the heavy periods 4)  Continue all previous medications as before this visit 5)  You will be contacted about the referral(s) to: Pulmonary for the sleep apnea 6)  try to be more active and lose weight 7)  Please schedule  a follow-up appointment in 1 year or sooner if needed Prescriptions: VALTREX 1 GM  TABS (VALACYCLOVIR HCL) 1 by mouth once daily  #90 x 3   Entered and Authorized by:   [name] MD   Signed by:   [name] MD on 07/30/2009   Method used:   Print then Give to Patient   RxID:   [id number] MICARDIS HCT 80-25 MG TABS (TELMISARTAN-HCTZ) Take 1 tablet by mouth once a day  #90 x 3   Entered and Authorized by:   [name] MD   Signed by:   [name] MD on 07/30/2009   Method used:   Print then Give to Patient   RxID:   [id number] MICARDIS HCT 80-25 MG TABS (TELMISARTAN-HCTZ) Take 1 tablet by mouth once a day  #90 x 3   Entered and Authorized by:   [name] MD   Signed by:   [name] MD on 07/30/2009   Method used:   Print then Give to Patient   RxID:   [id number]  .lbflu   Immunizations Administered:  Tetanus Vaccine:    Vaccine Type: Tdap    Site: right deltoid    Dose: 0.5 ml    Route: IM    Given by: Rock Nephew CMA    Exp. Date: 05/04/2011    Lot #: [id number]    VIS given: 09/06/07 version given July 30, 2009.

## 2010-11-18 NOTE — Miscellaneous (Signed)
Summary: optimal cpap pressure 11cm  Clinical Lists Changes  Orders: Added new Referral order of DME Referral (DME) - Signed auto shows optimal pressure 11cm, moderate compliance.

## 2010-11-18 NOTE — Assessment & Plan Note (Signed)
Summary: WANTS TO DISCUSS SUGAR/$50/CD   Vital Signs:  Patient Profile:   55 Years Old Young Weight:      228 pounds Temp:     97.9 degrees F oral Pulse rate:   64 / minute BP sitting:   140 / 96  (left arm)  Vitals Entered By: Jerilynn Mages (May 11, 2008 2:40 PM)                 Preventive Care Screening  Colonoscopy:    Date:  02/12/2006    Next Due:  02/2011    Results:  normal    Chief Complaint:  wants to discuss sugar.  History of Present Illness: here with several issues, fatigued, cant lose wt, has ongoing stress, overall quite unhappy with her life, not currently seeing a counselor; has significant OSA symptoms it seems but has not been doing well with the CPAP, really not using it, has been lost to f/u for this    Updated Prior Medication List: MICARDIS HCT 80-25 MG TABS (TELMISARTAN-HCTZ) Take 1 tablet by mouth once a day VALTREX 1 GM  TABS (VALACYCLOVIR HCL) 1 by mouth once daily  Current Allergies (reviewed today): ! CODEINE ! DARVON  Past Medical History:    Reviewed history and no changes required:       OSA       Hyperlipidemia       GERD       Anemia-iron deficiency       DJD - bilat knees       Colonic polyps, hx of  Past Surgical History:    Reviewed history and no changes required:       s/p breast cyst       s/p c-section       s/p right achilles tendon       s/p left achilles tendon       s/p ear surgury       Inguinal herniorrhaphy       hx of right ovary cyst       s/p extra-uterine fibroid   Family History:    Reviewed history and no changes required:       father with COPD, heart disease       ETOH dependence       DM       stroke  Social History:    Reviewed history and no changes required:       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  The patient denies anorexia, fever, weight loss, vision loss, decreased hearing, hoarseness, chest pain, syncope, dyspnea on exertion, peripheral edema, prolonged cough, headaches, hemoptysis, abdominal pain, melena, hematochezia, severe indigestion/heartburn, hematuria, incontinence, genital sores, muscle weakness, suspicious skin lesions, transient blindness, difficulty walking, unusual weight change, abnormal bleeding, enlarged lymph nodes, angioedema, and breast masses.         all otherwise negative , gained another 15 lbs   Physical Exam  General:     alert and overweight-appearing.   Head:  Normocephalic and atraumatic without obvious abnormalities. No apparent alopecia or balding. Eyes:     No corneal or conjunctival inflammation noted. EOMI. Perrla. Ears:     External ear exam shows no significant lesions or deformities.  Otoscopic examination reveals clear canals, tympanic membranes are intact bilaterally without bulging, retraction, inflammation or discharge. Hearing is grossly normal bilaterally. Nose:     External nasal examination shows no deformity or inflammation. Nasal mucosa are pink and moist without lesions or exudates. Mouth:     Oral mucosa and oropharynx without lesions or exudates.  Teeth in good repair. Neck:     No deformities, masses, or tenderness noted. Lungs:     Normal respiratory effort, chest expands symmetrically. Lungs are clear to auscultation, no crackles or wheezes. Heart:     Normal rate and regular rhythm. S1 and S2 normal without gallop, murmur, click, rub or other extra sounds. Abdomen:     Bowel sounds positive,abdomen soft and non-tender without masses, organomegaly or hernias noted. Msk:     No deformity or scoliosis noted of thoracic or lumbar spine.   Extremities:     No clubbing, cyanosis, edema, or deformity noted with  normal full range of motion of all joints.   Neurologic:     No cranial nerve deficits noted. Station and gait are normal. Plantar reflexes are down-going bilaterally. DTRs are symmetrical throughout. Sensory, motor and coordinative functions appear intact.    Impression & Recommendations:  Problem # 1:  Preventive Health Care (ICD-V70.0) Overall doing well, up to date, counseled on routine health concerns for screening and prevention, immunizations up to date or declined, labs ordered   Orders: T-Vitamin D (25-Hydroxy) (16109-60454) TLB-BMP (Basic Metabolic Panel-BMET) (80048-METABOL) TLB-CBC Platelet - w/Differential (85025-CBCD) TLB-Hepatic/Liver Function Pnl (80076-HEPATIC) TLB-Lipid Panel (80061-LIPID) TLB-TSH (Thyroid Stimulating Hormone) (84443-TSH) TLB-Udip ONLY (81003-UDIP)   Problem # 2:  OBSTRUCTIVE SLEEP APNEA (ICD-327.23) refer pulm Orders: Pulmonary Referral (Pulmonary)   Complete Medication List: 1)  Micardis Hct 80-25 Mg Tabs (Telmisartan-hctz) .... Take 1 tablet by mouth once a day 2)  Valtrex 1 Gm Tabs (Valacyclovir hcl) .[name].. 1 by mouth once daily   Patient Instructions: 1)  Continue all medications that you may have been taking previously 2)  Please go to the Lab in the basement for your blood tests today 3)  Please schedule a follow-up appointment in 1 year or sooner if needed 4)  Please call if you would like referral for counseling   Prescriptions: VALTREX 1 GM  TABS (VALACYCLOVIR HCL) 1 by mouth once daily  #30 x 11   Entered and Authorized by:   [name] MD   Signed by:   [name] MD on 05/11/2008   Method used:   Print then Give to Patient   RxID:   [id number] MICARDIS HCT 80-25 MG TABS (TELMISARTAN-HCTZ) Take 1 tablet by mouth once a day  #30 x 11   Entered and Authorized by:   [name] MD   Signed by:   [name] MD on 05/11/2008   Method used:   Print then Give to Patient   RxID:   [id number]  ]

## 2010-11-18 NOTE — Progress Notes (Signed)
Summary: duplicate rx  Phone Note Call from Patient Call back at Home Phone [phone]   Caller: Patient Summary of Call: patient was given rx for valtrex and  duplicate rx for micardis. She would like to know if one of those was supposed to be for iron. PLease advise. If there is a different rx then pt would like it sent to [facility] [address] Initial call taken by: Rock Nephew CMA,  July 30, 2009 2:26 PM  Follow-up for Phone Call        one was 30 day, one was 90 day for the micardis Follow-up by: [name] MD,  July 30, 2009 2:37 PM  Additional Follow-up for Phone Call Additional follow up Details #1::        pt informed via VM Additional Follow-up by: Margaret Pyle, CMA,  July 30, 2009 3:59 PM

## 2010-11-18 NOTE — Assessment & Plan Note (Signed)
Summary: rov for osa   Copy to:  Oliver Barre Primary Provider/Referring Provider:  [name] MD  CC:  Pt is here for an 8 week f/u appt since cpap pressure changes.  Pt states she is only wearing her cpap machine 2 nights per week.  Pt states she doesn't wear it more often d/t "laziness."  Pt denied any complaints with mask or pressure.  [name]  History of Present Illness: The pt comes in today for f/u of her osa.  At the last visit, her pressure was changed to optimal of 11cm, and I had hoped things would improve with regard to compliance.  The pt continues to wear only a few nights a week despite denying any issues with mask or pressure.  She continues to feel it is due to her own motivation.    Medications Prior to Update: 1)  Micardis Hct 80-25 Mg Tabs (Telmisartan-Hctz) .... Take 1 Tablet By Mouth Once A Day 2)  Valtrex 1 Gm  Tabs (Valacyclovir Hcl) .[name].. 1 By Mouth Once Daily 3)  Iron 325 (65 Fe) Mg Tabs (Ferrous Sulfate) .[name].. 1 By Mouth Once Daily  Allergies (verified): 1)  ! Codeine 2)  ! Darvon  Review of Systems      See HPI  Vital Signs:  Patient profile:   55 year old female Height:      60 inches Weight:      243 pounds BMI:     47.63 O2 Sat:      99 % on Room air Temp:     97.9 degrees F oral Pulse rate:   58 / minute BP sitting:   120 / 78  (left arm) Cuff size:   large  Vitals Entered By: Arman Filter LPN (December 16, 2009 10:09 AM)  O2 Flow:  Room air CC: Pt is here for an 8 week f/u appt since cpap pressure changes.  Pt states she is only wearing her cpap machine 2 nights per week.  Pt states she doesn't wear it more often d/t "laziness."  Pt denied any complaints with mask or pressure.   Comments Medications reviewed with patient Arman Filter LPN  December 16, 2009 10:09 AM    Physical Exam  General:  obese female in nad Nose:  very swollen turbinates with significant encroachment on nasal airway Mouth:  mild elongation of soft palate and  uvula Extremities:  no edema or cyanosis Neurologic:  alert, mildly sleepy, moves all 4.   Impression & Recommendations:  Problem # 1:  OBSTRUCTIVE SLEEP APNEA (ICD-327.23)  the pt is only partially compliant with cpap, and feels it does help a lot whenever she wears.  She feels that mask is comfortable, and no issues with pressure.  The main limitation to improved compliance is her own motivation.  I have also discussed with her treatment alternatives such as dental appliance and nasal surgery.  She is having a lot of issues with nasal congestion, and has swollen turbinates on exam.  Will try nasal ICS to see if things improve.  I have also encouraged her to work on weight loss.  Other Orders: Est. Patient Level III (65784)  Patient Instructions: 1)  use nasonex 2 each nostril each am.  Use everyday.  If not helping, can refer you to ENT for evaluation 2)  wear cpap regularly.  Can consider a dental appliance or nasal surgery if you wanted to look at other treatment options. 3)  work on weight loss 4)  followup with me in 6mos, but call for questions or problems.

## 2010-11-18 NOTE — Assessment & Plan Note (Signed)
Summary: consult for osa, fatigue   Copy to:  Oliver Barre Primary Provider/Referring Provider:  [name] MD  CC:  Sleep Consult.  History of Present Illness: The pt is a 55y/o female who comes in today for re-evaluation on osa.  She was diagnosed with very mild osa in 2003, with an AHI of 7/hr and desat to 78%.  At the time, it was unclear whether her symptoms of fatigue and lack of energy were even related to this mild degree of sleep apnea.  The plan was to try cpap, and if improves, would provide both a diagnostic and therapeutic trial.  Unfortunately, the pt was noncompliant with cpap, and also lost to f/u.  She comes in today where she continues to c/o fatigue, but really doesn't note significant sleepiness.  She has gained 23 pounds since her last visit.  She is getting adequate quantity of sleep, but feels unrested the next am.  Denies EDS at work or while trying to watch tv in evening.  She has no sleepiness with driving.  She denies RLS symptoms.  Her epworth today is only 8.    Current Medications (verified): 1)  Micardis Hct 80-25 Mg Tabs (Telmisartan-Hctz) .... Take 1 Tablet By Mouth Once A Day 2)  Valtrex 1 Gm  Tabs (Valacyclovir Hcl) .[name].. 1 By Mouth Once Daily 3)  Iron 325 (65 Fe) Mg Tabs (Ferrous Sulfate) .[name].. 1 By Mouth Once Daily  Allergies (verified): 1)  ! Codeine 2)  ! 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 father with COPD, heart disease ETOH dependence mother:  DM, HTN stroke  Social History: Reviewed history from 05/11/2008 and no changes required. 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  The patient denies shortness of breath at rest, productive cough, non-productive cough, coughing up blood, chest pain, irregular heartbeats, acid heartburn, indigestion, loss of appetite, difficulty swallowing, sore throat, tooth/dental problems, headaches, nasal congestion/difficulty breathing through nose, sneezing, itching, ear ache, anxiety, depression, rash, change in color of mucus, and fever.    Vital Signs:  Patient profile:   55 year old female Height:      60 inches Weight:      243 pounds O2 Sat:      100 % on Room air Temp:     98.1 degrees F Pulse rate:   62 / minute BP sitting:   122 / 76  (left arm)  Vitals Entered By: Arman Filter LPN (August 08, 2009 11:53 AM)  O2 Flow:  Room air CC: Sleep Consult Comments Medications reviewed with patient Arman Filter LPN  August 08, 2009 11:53 AM    Physical Exam  General:  obese female in nad Eyes:  PERRLA and EOMI.   Nose:  mild narrowing r>l, but patent Mouth:  mild elongation of soft palate, normal  uvula Neck:  no jvd, tmg, LN Lungs:  clear to auscultation Heart:  rrr, no mrg Abdomen:  soft and nontender, bs+ Extremities:  mild ankle edema, no cyanosis. pulses intact distally mild varicosities Neurologic:  alert and oriented, moves all 4.   Impression & Recommendations:  Problem # 1:  OBSTRUCTIVE SLEEP APNEA (ICD-327.23) The pt has a h/o mild osa, but has gained greater than 20 pounds since the last visit.  It is unclear if her current symptomatology is even related to her mild disease,  since she is c/o primarily fatigue rather than true sleepiness.  I wonder if depression is playing a role here?  I have told the patient there is no way to know if her sleep apnea is causing her symptoms without treating.  She tells me she did not stay on cpap because of a lack of effort.  I would like to re-initiate cpap on auto mode, and see how she responds.  I have also encouraged her to work on weight loss.  Other Orders: Consultation Level III (11914) DME Referral (DME)  Patient Instructions: 1)  will restart cpap with an autotitrating machine for 2 weeks to establish the appropriate pressure.  Will then set your machine at that pressure.  Please call if having tolerance issues. 2)  work on weight loss 3)  followup with me in 4 weeks.

## 2010-11-18 NOTE — Assessment & Plan Note (Signed)
Summary: CPX YEARLY / $50 / CD   Vital Signs:  Patient Profile:   55 Years Old Female Weight:      231 pounds Temp:     98.2 degrees F oral Pulse rate:   62 / minute BP sitting:   112 / 82  (left arm) Cuff size:   regular  Vitals Entered By: Payton Spark CMA (June 29, 2008 10:46 AM)                  Chief Complaint:  CPX.  History of Present Illness: overall doing well with several spots to lower leg with slight itching., did not make her appt for OSA eeval; has marked urinary freq without pain or hematuria for many months, has marked urgency but rare incontinence    Updated Prior Medication List: MICARDIS HCT 80-25 MG TABS (TELMISARTAN-HCTZ) Take 1 tablet by mouth once a day VALTREX 1 GM  TABS (VALACYCLOVIR HCL) 1 by mouth once daily VITAMIN D3 1000 UNIT  TABS (CHOLECALCIFEROL) 1 by mouth once daily LOTRISONE 1-0.05 % CREA (CLOTRIMAZOLE-BETAMETHASONE) use asd two times a day as needed DETROL LA 4 MG XR24H-CAP (TOLTERODINE TARTRATE) 1 by mouth once daily  Current Allergies (reviewed today): ! CODEINE ! DARVON  Past Medical History:    Reviewed history from 05/11/2008 and no changes required:       OSA       Hyperlipidemia       GERD       Anemia-iron deficiency       DJD - bilat knees       Colonic polyps, hx of       OAB  Past Surgical History:    Reviewed history from 05/11/2008 and no changes required:       s/p breast cyst       s/p c-section       s/p right achilles tendon       s/p left achilles tendon       s/p ear surgury       Inguinal herniorrhaphy       hx of right ovary cyst       s/p extra-uterine fibroid   Family History:    Reviewed history from 05/11/2008 and no changes required:       father with COPD, heart disease       ETOH dependence       DM       stroke  Social History:    Reviewed history from 05/11/2008 and no changes required:       divorced twice        social worker       Never Smoked       Alcohol use-yes    1 son deceased with homicide    Review of Systems       all otherwise negative    Physical Exam  General:     alert and overweight-appearing.   Head:     Normocephalic and atraumatic without obvious abnormalities. No apparent alopecia or balding. Eyes:     No corneal or conjunctival inflammation noted. EOMI. Perrla. F Ears:     External ear exam shows no significant lesions or deformities.  Otoscopic examination reveals clear canals, tympanic membranes are intact bilaterally without bulging, retraction, inflammation or discharge. Hearing is grossly normal bilaterally. Nose:     External nasal examination shows no deformity or inflammation. Nasal mucosa are pink and moist without lesions or exudates. Mouth:  Oral mucosa and oropharynx without lesions or exudates.  Teeth in good repair. Neck:     No deformities, masses, or tenderness noted. Lungs:     Normal respiratory effort, chest expands symmetrically. Lungs are clear to auscultation, no crackles or wheezes. Heart:     Normal rate and regular rhythm. S1 and S2 normal without gallop, murmur, click, rub or other extra sounds. Abdomen:     Bowel sounds positive,abdomen soft and non-tender without masses, organomegaly or hernias noted. Msk:     normal ROM, no joint tenderness, and no joint swelling.   Extremities:     no edema, no ulcers  Neurologic:     alert & oriented X3, cranial nerves II-XII intact, and strength normal in all extremities.   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 Use asd two times a day as needed treat as above, f/u any worsening signs or symptoms   Problem # 5:  HYPERTONICITY OF BLADDER (ICD-596.51) check UA, tx with detrol la 4 mg Orders: T-Culture, Urine ([id number]) TLB-Udip w/ Micro (81001-URINE)   Complete Medication List: 1)  Micardis Hct 80-25 Mg Tabs (Telmisartan-hctz) .... Take 1 tablet by mouth once a day 2)  Valtrex 1 Gm Tabs (Valacyclovir hcl) .[name].. 1 by mouth once daily 3)  Vitamin D3 1000 Unit Tabs (Cholecalciferol) .[name].. 1 by mouth once daily 4)  Lotrisone 1-0.05 % Crea (Clotrimazole-betamethasone) .... Use asd two times a day as needed 5)  Detrol La 4 Mg Xr24h-cap (Tolterodine tartrate) .[name].. 1 by mouth once daily   Patient Instructions: 1)  please call for yearly pap smear and mammogram with your GYN 2)  please call to re-schedule the sleep apnea visit 3)  Please take all new medications as prescribed - the detrol la 4 mg, and the lotrisone cream 4)  Please go to the Lab in the basement for your urine tests today 5)  Continue all medications that you may have been taking previously 6)  Please schedule a follow-up appointment in 1 year or sooner if needed   Prescriptions: DETROL LA 4 MG XR24H-CAP (TOLTERODINE TARTRATE) 1 by mouth once daily  #30 x 11   Entered and Authorized by:   [name] MD   Signed by:   [name] MD on 06/29/2008   Method used:   Print then Give to Patient   RxID:   [id number] LOTRISONE 1-0.05 % CREA (CLOTRIMAZOLE-BETAMETHASONE) use asd two times a day as needed  #1 x 1   Entered and Authorized by:   [name] MD   Signed by:   [name] MD on 06/29/2008   Method used:   Print then Give to Patient   RxID:    [id number]  ]

## 2010-11-18 NOTE — Progress Notes (Signed)
Summary: REFERRAL NEURO   Phone Note Call from Patient Call back at Home Phone [phone] Call back at [phone]   Caller: Patient Reason for Call: Talk to Nurse, Referral Summary of Call: TALK WITH DEBRA YESTERDAY, WENT TO EYE DOCTOR THIS AM LEFT EYE IS NOT BLINKING LIKE HER RIGHT EYE.Francis Dowse NEED REFERRAL TO A NEUO.  Initial call taken by: Lorne Skeens,  Feb 28, 2009 3:02 PM  Follow-up for Phone Call        pt seen by Dr Gerda Diss today, and was seen by eye doctor yesterday. was told by eye doctor there was a problem with her right eye and need to see neuro. pt states that dr Gerda Diss office taking care of referrel today.pt to call back if needs something Deliah Goody, RN  Mar 01, 2009 10:36 AM

## 2010-11-18 NOTE — Letter (Signed)
Summary: [facility] Consult Scheduled Letter  [facility]  [address] [place], [place] [address]   Phone: [phone]  Fax: [phone]      05/11/2008 MRN: [id number]  [name] [address] [place], [place]  [address]    Dear [name],      We have scheduled an appointment for you.  At the recommendation of Dr. Jonny Ruiz, we have scheduled you a consult with Dr. Shelle Iron on May 24, 2008 at 3:00pm.  Their phone number is [phone].  If this appointment day and time is not convenient for you, please feel free to call the office of the doctor you are being referred to at the number listed above and reschedule the appointment.  Dr. Nadyne [facility] [address] [place], [place] [address]  *Please give 24hr notice to cancel/reschedule your appointment to avoid a $50.00 fee*  Thank you,  Patient Care Coordinator [facility]

## 2011-01-22 ENCOUNTER — Other Ambulatory Visit: Payer: Self-pay

## 2011-01-23 ENCOUNTER — Other Ambulatory Visit: Payer: Self-pay | Admitting: Radiology

## 2011-01-23 DIAGNOSIS — C50912 Malignant neoplasm of unspecified site of left female breast: Secondary | ICD-10-CM

## 2011-01-29 ENCOUNTER — Encounter: Payer: Self-pay | Admitting: Internal Medicine

## 2011-01-29 ENCOUNTER — Ambulatory Visit
Admission: RE | Admit: 2011-01-29 | Discharge: 2011-01-29 | Disposition: A | Discharge status: Home / Self Care | Payer: 59 | Source: Ambulatory Visit | Attending: Radiology | Admitting: Radiology

## 2011-01-29 DIAGNOSIS — C50912 Malignant neoplasm of unspecified site of left female breast: Secondary | ICD-10-CM

## 2011-01-29 MED ORDER — GADOBENATE DIMEGLUMINE 529 MG/ML IV SOLN: 20.0000 mL | Freq: Once | INTRAVENOUS | Status: AC | PRN

## 2011-02-04 ENCOUNTER — Other Ambulatory Visit (HOSPITAL_COMMUNITY): Payer: Self-pay | Admitting: General Surgery

## 2011-02-04 ENCOUNTER — Other Ambulatory Visit: Payer: Self-pay | Admitting: Oncology

## 2011-02-04 ENCOUNTER — Encounter (HOSPITAL_BASED_OUTPATIENT_CLINIC_OR_DEPARTMENT_OTHER): Payer: 59 | Admitting: Oncology

## 2011-02-04 DIAGNOSIS — E669 Obesity, unspecified: Secondary | ICD-10-CM

## 2011-02-04 DIAGNOSIS — Z96659 Presence of unspecified artificial knee joint: Secondary | ICD-10-CM

## 2011-02-04 DIAGNOSIS — C50919 Malignant neoplasm of unspecified site of unspecified female breast: Secondary | ICD-10-CM

## 2011-02-04 DIAGNOSIS — C50912 Malignant neoplasm of unspecified site of left female breast: Secondary | ICD-10-CM

## 2011-02-04 DIAGNOSIS — I10 Essential (primary) hypertension: Secondary | ICD-10-CM

## 2011-02-04 DIAGNOSIS — C50119 Malignant neoplasm of central portion of unspecified female breast: Secondary | ICD-10-CM

## 2011-02-04 LAB — COMPREHENSIVE METABOLIC PANEL
ALT: 19 U/L (ref 0–35)
AST: 20 U/L (ref 0–37)
Albumin: 3.2 g/dL — ABNORMAL LOW (ref 3.5–5.2)
Alkaline Phosphatase: 37 U/L — ABNORMAL LOW (ref 39–117)
BUN: 9 mg/dL (ref 6–23)
CO2: 28 mEq/L (ref 19–32)
Calcium: 9.2 mg/dL (ref 8.4–10.5)
Chloride: 104 mEq/L (ref 96–112)
Creatinine, Ser: 0.65 mg/dL (ref 0.40–1.20)
Glucose, Bld: 138 mg/dL — ABNORMAL HIGH (ref 70–99)
Potassium: 3.5 mEq/L (ref 3.5–5.3)
Sodium: 138 mEq/L (ref 135–145)
Total Bilirubin: 0.5 mg/dL (ref 0.3–1.2)
Total Protein: 7.3 g/dL (ref 6.0–8.3)

## 2011-02-04 LAB — CBC WITH DIFFERENTIAL/PLATELET
BASO%: 0.3 % (ref 0.0–2.0)
Basophils Absolute: 0 10*3/uL (ref 0.0–0.1)
EOS%: 1.4 % (ref 0.0–7.0)
Eosinophils Absolute: 0.1 10*3/uL (ref 0.0–0.5)
HCT: 33.9 % — ABNORMAL LOW (ref 34.8–46.6)
HGB: 11.3 g/dL — ABNORMAL LOW (ref 11.6–15.9)
LYMPH%: 23.2 % (ref 14.0–49.7)
MCH: 29 pg (ref 25.1–34.0)
MCHC: 33.3 g/dL (ref 31.5–36.0)
MCV: 86.9 fL (ref 79.5–101.0)
MONO#: 0.4 10*3/uL (ref 0.1–0.9)
MONO%: 6.7 % (ref 0.0–14.0)
NEUT#: 3.9 10*3/uL (ref 1.5–6.5)
NEUT%: 68.4 % (ref 38.4–76.8)
Platelets: 288 10*3/uL (ref 145–400)
RBC: 3.9 10*6/uL (ref 3.70–5.45)
RDW: 14.2 % (ref 11.2–14.5)
WBC: 5.7 10*3/uL (ref 3.9–10.3)
lymph#: 1.3 10*3/uL (ref 0.9–3.3)

## 2011-02-04 LAB — CANCER ANTIGEN 27.29: CA 27.29: 19 U/mL (ref 0–39)

## 2011-02-06 ENCOUNTER — Other Ambulatory Visit: Payer: Self-pay

## 2011-02-06 ENCOUNTER — Other Ambulatory Visit: Payer: Self-pay | Admitting: Internal Medicine

## 2011-02-06 DIAGNOSIS — Z0000 Encounter for general adult medical examination without abnormal findings: Secondary | ICD-10-CM

## 2011-02-08 ENCOUNTER — Encounter: Payer: Self-pay | Admitting: Internal Medicine

## 2011-02-08 DIAGNOSIS — Z0000 Encounter for general adult medical examination without abnormal findings: Secondary | ICD-10-CM | POA: Insufficient documentation

## 2011-02-09 ENCOUNTER — Other Ambulatory Visit: Payer: Self-pay

## 2011-02-09 MED ORDER — TELMISARTAN-HCTZ 80-25 MG PO TABS: 1.0000 | ORAL_TABLET | Freq: Every day | ORAL | Status: DC

## 2011-02-10 ENCOUNTER — Ambulatory Visit (HOSPITAL_COMMUNITY): Payer: 59

## 2011-02-10 ENCOUNTER — Ambulatory Visit (HOSPITAL_COMMUNITY)
Admission: RE | Admit: 2011-02-10 | Discharge: 2011-02-10 | Disposition: A | Discharge status: Home / Self Care | Payer: 59 | Source: Ambulatory Visit | Attending: General Surgery | Admitting: General Surgery

## 2011-02-10 ENCOUNTER — Other Ambulatory Visit: Payer: Self-pay | Admitting: General Surgery

## 2011-02-10 DIAGNOSIS — C773 Secondary and unspecified malignant neoplasm of axilla and upper limb lymph nodes: Secondary | ICD-10-CM | POA: Insufficient documentation

## 2011-02-10 DIAGNOSIS — G4733 Obstructive sleep apnea (adult) (pediatric): Secondary | ICD-10-CM | POA: Insufficient documentation

## 2011-02-10 DIAGNOSIS — C50919 Malignant neoplasm of unspecified site of unspecified female breast: Secondary | ICD-10-CM | POA: Insufficient documentation

## 2011-02-10 DIAGNOSIS — C50912 Malignant neoplasm of unspecified site of left female breast: Secondary | ICD-10-CM

## 2011-02-10 DIAGNOSIS — I10 Essential (primary) hypertension: Secondary | ICD-10-CM | POA: Insufficient documentation

## 2011-02-10 LAB — SURGICAL PCR SCREEN
MRSA, PCR: NEGATIVE
Staphylococcus aureus: NEGATIVE

## 2011-02-10 MED ORDER — TECHNETIUM TC 99M SULFUR COLLOID FILTERED
1.0000 | Freq: Once | INTRAVENOUS | Status: AC | PRN
  Administered 2011-02-10: 1 via INTRADERMAL

## 2011-02-12 ENCOUNTER — Other Ambulatory Visit: Payer: 59

## 2011-02-13 ENCOUNTER — Ambulatory Visit (INDEPENDENT_AMBULATORY_CARE_PROVIDER_SITE_OTHER): Payer: 59 | Admitting: Internal Medicine

## 2011-02-13 ENCOUNTER — Other Ambulatory Visit: Payer: Self-pay

## 2011-02-13 ENCOUNTER — Encounter: Payer: Self-pay | Admitting: Internal Medicine

## 2011-02-13 ENCOUNTER — Other Ambulatory Visit (INDEPENDENT_AMBULATORY_CARE_PROVIDER_SITE_OTHER): Payer: 59

## 2011-02-13 ENCOUNTER — Other Ambulatory Visit (INDEPENDENT_AMBULATORY_CARE_PROVIDER_SITE_OTHER): Payer: 59 | Admitting: Internal Medicine

## 2011-02-13 VITALS — BP 112/82 | HR 61 | Temp 98.3°F | Ht <= 58 in | Wt 241.5 lb

## 2011-02-13 DIAGNOSIS — Z0000 Encounter for general adult medical examination without abnormal findings: Secondary | ICD-10-CM

## 2011-02-13 DIAGNOSIS — IMO0001 Reserved for inherently not codable concepts without codable children: Secondary | ICD-10-CM

## 2011-02-13 DIAGNOSIS — R7309 Other abnormal glucose: Secondary | ICD-10-CM

## 2011-02-13 DIAGNOSIS — R21 Rash and other nonspecific skin eruption: Secondary | ICD-10-CM

## 2011-02-13 DIAGNOSIS — R079 Chest pain, unspecified: Secondary | ICD-10-CM

## 2011-02-13 DIAGNOSIS — M797 Fibromyalgia: Secondary | ICD-10-CM

## 2011-02-13 DIAGNOSIS — R7302 Impaired glucose tolerance (oral): Secondary | ICD-10-CM

## 2011-02-13 LAB — BASIC METABOLIC PANEL
BUN: 14 mg/dL (ref 6–23)
CO2: 27 mEq/L (ref 19–32)
Calcium: 9 mg/dL (ref 8.4–10.5)
Chloride: 104 mEq/L (ref 96–112)
Creatinine, Ser: 0.7 mg/dL (ref 0.4–1.2)
GFR: 107.5 mL/min (ref >60.00)
Glucose, Bld: 86 mg/dL (ref 70–99)
Potassium: 4.5 mEq/L (ref 3.5–5.1)
Sodium: 138 mEq/L (ref 135–145)

## 2011-02-13 LAB — HEPATIC FUNCTION PANEL
ALT: 11 U/L (ref 0–35)
AST: 14 U/L (ref 0–37)
Albumin: 3.1 g/dL — ABNORMAL LOW (ref 3.5–5.2)
Alkaline Phosphatase: 38 U/L — ABNORMAL LOW (ref 39–117)
Bilirubin, Direct: 0.2 mg/dL (ref 0.0–0.3)
Total Bilirubin: 0.5 mg/dL (ref 0.3–1.2)
Total Protein: 7 g/dL (ref 6.0–8.3)

## 2011-02-13 LAB — LIPID PANEL
Cholesterol: 169 mg/dL (ref 0–200)
HDL: 59.6 mg/dL (ref >39.00)
LDL Cholesterol: 92 mg/dL (ref 0–99)
Total CHOL/HDL Ratio: 3
Triglycerides: 89 mg/dL (ref 0.0–149.0)
VLDL: 17.8 mg/dL (ref 0.0–40.0)

## 2011-02-13 LAB — URINALYSIS, ROUTINE W REFLEX MICROSCOPIC
Bilirubin Urine: NEGATIVE
Ketones, ur: NEGATIVE
Leukocytes, UA: NEGATIVE
Nitrite: NEGATIVE
Specific Gravity, Urine: 1.015 (ref 1.000–1.030)
Total Protein, Urine: NEGATIVE
Urine Glucose: NEGATIVE
Urobilinogen, UA: 0.2 (ref 0.0–1.0)
pH: 7 (ref 5.0–8.0)

## 2011-02-13 LAB — TSH: TSH: 2.11 u[IU]/mL (ref 0.35–5.50)

## 2011-02-13 MED ORDER — TRIAMCINOLONE ACETONIDE 0.1 % EX CREA: TOPICAL_CREAM | Freq: Two times a day (BID) | CUTANEOUS | Status: DC

## 2011-02-13 MED ORDER — TELMISARTAN-HCTZ 80-25 MG PO TABS: 1.0000 | ORAL_TABLET | Freq: Every day | ORAL | Status: DC

## 2011-02-13 MED ORDER — DULOXETINE HCL 60 MG PO CPEP: 60.0000 mg | ORAL_CAPSULE | Freq: Every day | ORAL | Status: DC

## 2011-02-13 NOTE — Patient Instructions (Addendum)
Please call the number on the [facility] (the [facility] System) for results of testing in 2-3 days Take all new medications as prescribed Continue all other medications as before Please keep your appointments with your specialists as you have planned Start the cymbalta sample at 30 mg per day for 1 wk,then 60 mg per day after that Please call if you wish to be referred to ENT for the cyst near the left ear Please return in 6 mo with Lab testing done 3-5 days before

## 2011-02-13 NOTE — Progress Notes (Signed)
Quick Note:  Voice message left on [employer] system - lab is negative, normal or otherwise stable, pt to continue same tx ______ 

## 2011-02-13 NOTE — Progress Notes (Signed)
Subjective:    Patient ID: [name], female    DOB: Nov 12, 1955, 55 y.o.   MRN: [id number]  HPI  Here for wellness and f/u;  Overall doing ok;  Pt denies CP, worsening SOB, DOE, wheezing, orthopnea, PND, worsening LE edema, palpitations, dizziness or syncope.  Pt denies neurological change such as new Headache, facial or extremity weakness.  Pt denies polydipsia, polyuria, or low sugar symptoms. Pt states overall good compliance with treatment and medications, good tolerability, and trying to follow lower cholesterol diet.  Pt denies worsening depressive symptoms, suicidal ideation or panic. No fever, wt loss, night sweats, loss of appetite, or other constitutional symptoms.  Pt states good ability with ADL's, low fall risk, home safety reviewed and adequate, no significant changes in hearing or vision, and occasionally active with exercise.  Just had left breast surgury with axillary LN sampling - she gets results next wk.  Also with pain SSCP with radiation to the left chest off and on, sharp, worse with doing housework and occasional deep  Breaths, and was occuring prior to surgury.  Also with 4 wks rash with itch in front of left ear that she cont's to scratch and is at the site of the previous sebaceous cyst, sometimes still drains with manipulation.  Also with diffuse achy tender pain to the upper and lower back chronic for almost a yr, with marked stiffness in the am, worse with better rest at night.   Past Medical History  Diagnosis Date  . HYPERLIPIDEMIA 05/11/2008  . ANEMIA-IRON DEFICIENCY 05/11/2008  . GERD 05/11/2008  . OBSTRUCTIVE SLEEP APNEA 05/11/2008  . 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 RASH-NONVESICULAR 06/29/2008  . COLONIC POLYPS, HX OF 05/11/2008  . GENITAL HERPES, HX OF 08/08/2009   Past Surgical History  Procedure Date  . Breast cyst excision 1973  . Cesarean section   . Right achilles tendon     and left  . S/p ear surgury   . Right ovarian cyst     hx  . S/p  extra uterine fibroid 2006  . S/p left knee replacement 2007    reports that she has never smoked. She does not have any smokeless tobacco history on file. She reports that she drinks alcohol. Her drug history not on file. family history includes Alcohol abuse in her father; Diabetes in her mother; Heart disease in her father; Hypertension in her mother; and Stroke in her mother. Allergies  Allergen Reactions  . Codeine   . Propoxyphene Hcl    Current Outpatient Prescriptions on File Prior to Visit  Medication Sig Dispense Refill  . Ferrous Sulfate (IRON) 325 (65 FE) MG TABS Take by mouth daily.        . valACYclovir (VALTREX) 1000 MG tablet Take 1,000 mg by mouth daily.         Review of Systems Review of Systems  Constitutional: Negative for diaphoresis, activity change, appetite change and unexpected weight change.  HENT: Negative for hearing loss, ear pain, facial swelling, mouth sores and neck stiffness.   Eyes: Negative for pain, redness and visual disturbance.  Respiratory: Negative for shortness of breath and wheezing.   Cardiovascular: Negative for chest pain and palpitations.  Gastrointestinal: Negative for diarrhea, blood in stool, abdominal distention and rectal pain.  Genitourinary: Negative for hematuria, flank pain and decreased urine volume.  Musculoskeletal: Negative for myalgias and joint swelling.  Skin: Negative for color change and wound.  Neurological: Negative for syncope and numbness.  Hematological: Negative for  adenopathy.  Psychiatric/Behavioral: Negative for hallucinations, self-injury, decreased concentration and agitation.      Objective:   Physical Exam BP 112/82  Pulse 61  Temp(Src) 98.3 F (36.8 C) (Oral)  Ht 4\' 9"  (1.448 m)  Wt 241 lb 8 oz (109.544 kg)  BMI 52.26 kg/m2  SpO2 98%  LMP 01/01/2011 Physical Exam  VS noted Constitutional: Pt is oriented to person, place, and time. Appears well-developed and well-nourished.  HENT:  Head:  Normocephalic and atraumatic.  Right Ear: External ear normal.  Left Ear: External ear normal.  Nose: Nose normal.  Mouth/Throat: Oropharynx is clear and moist.  Eyes: Conjunctivae and EOM are normal. Pupils are equal, round, and reactive to light.  Neck: Normal range of motion. Neck supple. No JVD present. No tracheal deviation present.  Cardiovascular: Normal rate, regular rhythm, normal heart sounds and intact distal pulses.   Pulmonary/Chest: Effort normal and breath sounds normal.  Abdominal: Soft. Bowel sounds are normal. There is no tenderness.  Musculoskeletal: Normal range of motion. Exhibits no edema. Has diffuse tender trigger points to upper and lower back, also tender lower sternal area at the left lower costal margin Lymphadenopathy:  Has no cervical adenopathy.  Neurological: Pt is alert and oriented to person, place, and time. Pt has normal reflexes. No cranial nerve deficit.  Skin: Skin is warm and dry. No rash noted. except for 1-2 cm area preauricular scaly nontender rash Psychiatric: . Behavior is normal.   Dysphoric and 2+ nervous today       Assessment & Plan:

## 2011-02-14 ENCOUNTER — Encounter: Payer: Self-pay | Admitting: Internal Medicine

## 2011-02-14 NOTE — Assessment & Plan Note (Signed)
Small area dermatitis left preauricular - for triam cream prn

## 2011-02-14 NOTE — Assessment & Plan Note (Signed)

## 2011-02-14 NOTE — Assessment & Plan Note (Signed)
stable overall by hx and exam, most recent lab reviewed with pt, and pt to continue medical treatment as before , declines further labs for now, to cont wt loss efforts,  to f/u any worsening symptoms or concerns and labs next visit

## 2011-02-14 NOTE — Assessment & Plan Note (Signed)
D/w pt - exam c/w costochondritis; for tylenol prn, no further eval or tx needed

## 2011-02-14 NOTE — Assessment & Plan Note (Signed)
Mild to mod, grad worse for several months;  For cymbalta 30 mg for 1 wk in samples, then 60 mg after that

## 2011-02-16 LAB — CBC WITH DIFFERENTIAL/PLATELET
Basophils Absolute: 0 10*3/uL (ref 0.0–0.1)
Basophils Relative: 0.6 % (ref 0.0–3.0)
Eosinophils Absolute: 0.2 10*3/uL (ref 0.0–0.7)
Eosinophils Relative: 2.6 % (ref 0.0–5.0)
HCT: 34 % — ABNORMAL LOW (ref 36.0–46.0)
Hemoglobin: 11.5 g/dL — ABNORMAL LOW (ref 12.0–15.0)
Lymphocytes Relative: 30.2 % (ref 12.0–46.0)
Lymphs Abs: 1.9 10*3/uL (ref 0.7–4.0)
MCHC: 33.8 g/dL (ref 30.0–36.0)
MCV: 87.7 fl (ref 78.0–100.0)
Monocytes Absolute: 0.7 10*3/uL (ref 0.1–1.0)
Monocytes Relative: 10.5 % (ref 3.0–12.0)
Neutro Abs: 3.5 10*3/uL (ref 1.4–7.7)
Neutrophils Relative %: 56.1 % (ref 43.0–77.0)
Platelets: 284 10*3/uL (ref 150.0–400.0)
RBC: 3.88 Mil/uL (ref 3.87–5.11)
RDW: 14.8 % — ABNORMAL HIGH (ref 11.5–14.6)
WBC: 6.2 10*3/uL (ref 4.5–10.5)

## 2011-02-24 ENCOUNTER — Encounter (HOSPITAL_BASED_OUTPATIENT_CLINIC_OR_DEPARTMENT_OTHER): Payer: 59 | Admitting: Oncology

## 2011-02-24 DIAGNOSIS — I10 Essential (primary) hypertension: Secondary | ICD-10-CM

## 2011-02-24 DIAGNOSIS — Z96659 Presence of unspecified artificial knee joint: Secondary | ICD-10-CM

## 2011-02-24 DIAGNOSIS — C50119 Malignant neoplasm of central portion of unspecified female breast: Secondary | ICD-10-CM

## 2011-02-24 DIAGNOSIS — E669 Obesity, unspecified: Secondary | ICD-10-CM

## 2011-02-24 NOTE — Op Note (Signed)
[name], [name]          ACCOUNT NO.:  [id number]  MEDICAL RECORD NO.:  [id number]           PATIENT TYPE:  O  LOCATION:  [facility]                         FACILITY:  [facility]  PHYSICIAN:  Almond Lint, MD       DATE OF BIRTH:  11/15/1955  DATE OF PROCEDURE:  02/10/2011 DATE OF DISCHARGE:                              OPERATIVE REPORT   PREOPERATIVE DIAGNOSIS:  Left breast cancer, clinical T1N0.  POSTOPERATIVE DIAGNOSIS:  Left breast cancer, clinical T1N0.  PROCEDURE:  Left sentinel lymph node biopsy, lymphatic mapping, and left needle-localized segmental mastectomy.  SURGEON:  Almond Lint, MD  ANESTHESIA:  General and local.  FINDINGS:  Two sentinel lymph nodes, one hot with 182 counts per second and #2 hot and blue 184 counts per second, background 4.  SPECIMEN: 1. Axillary sentinel lymph nodes. 2. Left needle-localized segmental mastectomy. 3. Additional inferior margin. 4. Final inferior margin.  These were to pathology.  ESTIMATED BLOOD LOSS:  Minimal.  COMPLICATIONS:  None known.  With the original needle-localized segmental mastectomy, the wire and breast specimen were in the breast tissue but the clip was not.  The additional margins were checked under fluoroscopy and a clip was present.  ESTIMATED BLOOD LOSS:  Minimal.  COMPLICATIONS:  None known.  PROCEDURE IN DETAIL:  Ms. Lopata was identified in the holding area and taken to operating room where she was placed supine on the operating room table.  General anesthesia was induced.  Time-out was performed according to surgical safety check list.  When all was correct, we continued.  Blue dye was injected in the subareolar location.  The lymph nodes were addressed first.  The area of maximum counts per second was identified in the axilla and a 3-cm incision was made after administration of local anesthesia.  The clavipectoral fascia and subcutaneous tissues were divided with the cautery.  The  axilla dissected bluntly with a tonsil until lymph node was found.  This was elevated with a tonsil and dissected around it.  Clips were used liberally to help prevent swelling.  The node was small and was taken off and was found to be hot.  There was additional hot area in the axilla.  This was elevated with an Allis clamp and this lymph node was dissected around with the cautery and with clips.  The clips were used to contain the lymphovascular channels.  The incision was then irrigated and closed with 3-0 Vicryl pops and 4-0 Monocryl in a subcuticular fashion.  Attention was then directed to the breast.  A curvilinear incision was made around the areola.  The skin was hooked to elevate the breast tissue and the wire was pulled underneath the skin.  The site of the wire was identified and the specimen was taken with curved Mayo scissors.  The wire was never encountered.  Once this tissue was taken out, this was sent over to frozen.  The Faxitron was not working.  The wound was closed while waiting for frozen to call back.  They called back and took the wire and the concerning area seemed to be in the specimen but the clip was not  in the specimen.  The wound was reopened and additional margins were taken.  The fluoroscopy was used to confirm presence of the clip in the tissue.  The wound was then irrigated and closed after the specimen was marked with a margin marker kit.  A mastopexy was performed to preserve the contour of the breast.  The mastopexy was performed with a 3-0 Vicryl and then the skin was reapproximated with 3-0 Vicryl deep dermal sutures and 4-0 Monocryl running subcuticular sutures.  The wound was then cleaned, dried, and dressed with Steri-Strips, gauze, and a breast binder.  The patient was awakened from anesthesia and taken to PACU in stable condition.     Almond Lint, MD     FB/MEDQ  D:  02/10/2011  T:  02/11/2011  Job:  [id number]  Electronically Signed by  Almond Lint MD on 02/24/2011 03:53:34 PM

## 2011-03-06 NOTE — Discharge Summary (Signed)
Jacqueline [name], [name]          ACCOUNT NO.:  [id number]   MEDICAL RECORD NO.:  [id number]          PATIENT TYPE:  INP   LOCATION:  1509                         FACILITY:  [facility]   PHYSICIAN:  Jacqueline [name], M.D.DATE OF BIRTH:  1956-09-07   DATE OF ADMISSION:  09/16/2006  DATE OF DISCHARGE:  09/20/2006                               DISCHARGE SUMMARY   ADMISSION DIAGNOSIS:  End-stage osteoarthritis, left knee.   DISCHARGE DIAGNOSIS:  End-stage osteoarthritis, left knee.   OPERATION:  Total knee arthroplasty, left knee.   BRIEF HISTORY:  This is a 55 year old lady well known to [place] with a  history of end-stage osteoarthritis of the left knee, which has failed  conservative management to relieve pain.  Due to continued difficulties  with her knee, she is now scheduled for total knee arthroplasty.  The  surgery with its risks, benefits and aftercare were discussed in detail  with the patient.  Questions were provided and answered.  The surgery  __________ as scheduled.   LABORATORY VALUES:  Admission CBC showed a hemoglobin low at 10.9,  hematocrit low at 32.9 and hemoglobin and hematocrit reached a low of  8.8 and 26.2 on the 1st and the 2nd; however, it is back up to 9.1 and  26.4.  Admission CMET within normal limits with the exception of a low  albumin at 3.1.  She was mildly hyponatremic on the 30th at 1:31.  Was  placed on free water restrictions and was back up to a normal range.  She ran a mildly elevated glucose at 118 and 155.   COURSE IN THE HOSPITAL:  Patient tolerated the operative procedure well.  The first postoperative day, vital signs are stable.  She was afebrile.  I&O was good.  Hemoglobin 9.6 and hematocrit 29.  Mildly hyponatremic at  131.  Dressing was dry.  Drain was removed without difficulty.  Calves  were negative.  Patient was placed on 1000 cc fluid restriction and  started on therapy.   On the second postoperative day, vital signs were stable  and afebrile.  Hemoglobin 8.8, hematocrit 26.2.   The wound was benign.  Calves were negative.  Lungs were clear.  Neurovascular status was intact.  Hyponatremia had corrected.   The third postoperative day, vital signs were stable.  She was afebrile.  The wound was benign.  Calves were negative.  Hemoglobin was back up to  9.1 with hematocrit of 26.4.  Neurovascularly, she was intact.  Plans  were made for discharge.   On the fourth postoperative day, she was feeling good . Vital signs  stable.  Afebrile.  Lungs clear.  Heart sounds normal.  Bowel sounds  active.  Calves negative.  Wound benign.  The patient subsequently  discharged home for followup with the office.   CONDITION ON DISCHARGE:  Improved.   DISCHARGE MEDICATIONS:  1. Percocet 5/325 1 q.4-6h. p.r.n. pain.  2. Robaxin 500 mg 1 p.o. q.8h. p.r.n. spasm.  3. Lovenox 30 mg 1 subcu at 7 a.m. and 7 p.m. for seven days.  4. Trinsicon 1 b.i.d. for anemia.   DISCHARGE INSTRUCTIONS:  She  is to do her home therapy.  Home health  will do her Lovenox shots 1 at 7 a.m. and 1 at 7 p.m. for another week,  to give her a 10 day course, and then call the office today for followup  in two weeks or call sooner p.r.n. problems.      Jacqueline [name], P.A.    ______________________________  Jacqueline [name], M.D.    SJC/MEDQ  D:  09/20/2006  T:  09/20/2006  Job:  [id number]

## 2011-03-06 NOTE — Op Note (Signed)
NAMEJAIMARIE, RAPOZO          ACCOUNT NO.:  [id number]   MEDICAL RECORD NO.:  [id number]          PATIENT TYPE:  AMB   LOCATION:  DAY                          FACILITY:  [facility]   PHYSICIAN:  Naima A. Dillard, M.D. DATE OF BIRTH:  10/25/1955   DATE OF PROCEDURE:  08/21/2005  DATE OF DISCHARGE:                                 OPERATIVE REPORT   PREOPERATIVE DIAGNOSES:  1.  Symptomatic fibroids.  2.  Left inguinal hernia.   POSTOPERATIVE DIAGNOSES:  1.  Symptomatic fibroids.  2.  Left inguinal hernia.  3.  Right ovarian cyst.   OPERATION:  1.  Exploratory laparotomy.  2.  Uterine myomectomy.  3.  Right ovarian cystectomy.  4.  Repair of the left inguinal hernia.   SURGEONS:  1.  Dr. Normand Sloop  2.  Dr. Lurene Shadow   ASSISTANT:  Dr. Su Hilt   ANESTHESIA:  General endotracheal tube airway.   SPECIMENS:  Fibroids 440 g ovarian cyst.   ESTIMATED BLOOD LOSS:  200 mL.   URINE OUTPUT:  950 mL.   IV FLUIDS:  4300 mL.   COMPLICATIONS:  None.  The patient went to PACU in stable condition.   PROCEDURE IN DETAIL:  The patient was taken to the operating room where she  was given general anesthesia, placed in dorsal lithotomy position, and  prepped and draped in a normal sterile fashion.  A Foley catheter was  placed.  A Pfannenstiel skin incision was then made with the scalpel and  carried down to the fascia.  Dr. Lurene Shadow then did the hernia repair.  He will  dictate that part.  The remainder of the fascia was opened in the midline,  extended bilaterally.  Kochers x 2 were placed in the superior aspect of the  fascia and dissected from the rectus muscle both sharply and bluntly.  The  inferior aspect of the fascia was dissected in a similar fashion.  The  peritoneum was identified, tented up, and entered sharply, extended  superiorly/inferiorly with good visualize of bowel and bladder.  The uterus  was then elevated out of the abdomen.  There was 1 large pedunculated  fibroid.  Two  Kellys were placed at the stalk, and the fibroid was bovied  off of the uterus.  Vasopressin 20 units in 100 mL was then placed on the  posterior aspect of the uterus, and the serosa was cut using Bovie cautery,  and the fibroid was removed both sharply and bluntly.  Attention was then  turned to the anterior aspect of the uterus which was dissected with Bovie  cautery and the fibroid grasped with a towel clamp and bluntly and sharply  removed.  There was another small fibroid just to the right side of the  fundus.  Vasopressin was used and the Bovie cautery, and the fibroid was  dissected out.  There was one other small one anteriorly on the uterus.  Vasopressin was used.  Bovie cautery was used to disrupt the serosa.  The  fibroid was dissected out.  The endometrium was not entered.  The myometrium  was repaired with 0 Vicryl in figure-of-eight stitches.  The serosa was  repaired with 3-0 Vicryl and running locked stitches.  Hemostasis was  assured.  There was oozing along the suture line.  This was made hemostatic  with Bovie cautery and pressure.  Also, Surgicel was placed along the suture  line.  The patient's right ovarian cyst was then entered.  The cyst wall was  removed.  A large amount of the cyst wall was removed sharply, and the cyst  wall was removed entirely from the entire ovarian tissue.  There was some  oozing which was made hemostatic with Bovie cautery and the ovarian capsule  reapproximated using 3-0 Vicryl in a running locked fashion.  Irrigation was  done on the abdomen.  All sponges and instruments were removed from the  abdomen.  The uterus was then re-placed.  Intercede was placed over top of  the Surgicel where the myomectomy took place.  The peritoneum was closed  with 2-0 chromic.  The fascia was closed with 0 Vicryl.  The fascial  incision made by Dr. Lurene Shadow was also closed with 0 Vicryl.  The subcutaneous  tissue was closed with 2-0 plain.  The skin was closed  with 3-0 Monocryl in  a subcuticular fashion.  Sponge, lap, and needle counts were correct x 2.  The patient went to the recovery room in stable condition.      Naima A. Normand Sloop, M.D.  Electronically Signed     NAD/MEDQ  D:  08/21/2005  T:  08/21/2005  Job:  [id number]

## 2011-03-06 NOTE — Op Note (Signed)
[name], [name]          ACCOUNT NO.:  [id number]   MEDICAL RECORD NO.:  [id number]          PATIENT TYPE:  INP   LOCATION:  0009                         FACILITY:  [facility]   PHYSICIAN:  Erasmo Leventhal, M.D.DATE OF BIRTH:  09-29-1956   DATE OF PROCEDURE:  09/16/2006  DATE OF DISCHARGE:                               OPERATIVE REPORT   PREOPERATIVE DIAGNOSIS:  Left knee end-stage osteoarthritis.   POSTOPERATIVE DIAGNOSIS:  Left knee end-stage osteoarthritis.   PROCEDURE:  Left total knee arthroplasty.   SURGEON:  Erasmo Leventhal, M.D.   ASSISTANT:  [name], PA-C.   ANESTHESIA:  Spinal, Duramorph, with monitored anesthesia care.   ESTIMATED BLOOD LOSS:  Less than 100 cc.   DRAINS:  Two medium Hemovac.   COMPLICATIONS:  None.   TOURNIQUET TIME:  One hour, 30 minutes at 350 mmHg.   OPERATIVE DETAILS:  Patient is counseled in the holding area.  The  correct side was identified.  The IV was started.  Taken to the  operating room where a spinal anesthetic was administered.  IV  prophylactic antibiotics were also given.  A Foley catheter is placed,  utilizing sterile technique by the OR circulating nurse.  All  extremities were well padded and bumped.  The left knee was examined.  A  5 degree flexion contracture can flex to 115 degrees, limited by the  posterior aspect of her thigh.  Elevated.  Prepped with DuraPrep and  draped in a sterile fashion.  Exsanguinated with esmarch.  The  tourniquet was inflated to 350 mmHg due to the large size of her thigh.  A straight and midline incision was made through the skin and  subcutaneous tissue.  Medial lateral soft tissue flaps were developed.  Small vessels were coagulated.  A medial parapatellar arthrotomy was  performed.  Proximal and medial soft tissue release was done to the  varus malalignment and medial subluxation.  The patella was retracted  out of the way.  End-stage arthritis changes,  bone-against-bone.  Osteophytes at the intercondylar notch.  Cruciate ligaments were  resected.  A starting hole at the mid distal femur.  The canal was  irrigated.  Effluent was clear.  The intramedullary rod was gently  placed.  I chose a 5 degree valgus cut and took a 10 mm cut off the  distal femur __________  plantar flexion contracture.  The distal femur  was found to be a size 2.5.  Rotation marks were set.  The cutting block  was applied, and it was cut to fit a 2.5.  Medial lateral menisci were  removed under direct visualization.  Geniculate vessels were coagulated.  The tibial eminence was resected.  Posterior neurovascular structures  were __________  off and protected throughout the entire case.  Osteophytes were removed from the proximal tibia, found to be a size  2.5.  The central aspect was noted.  A starter hole was made.  A step  reamer was utilized.  The canal was irrigated.  Effluent was clear.  The  __________  was gently placed.  I chose a 0 degree slope with a  10 mm  cut, based upon the least deficient, which was the lateral side.  This  gave a nice tibial resection.  Posterior medial and posterior femoral  osteophytes were removed under direct visualization.  At this time,  flexion and extension blocks of 12.5.  We had a well-balanced knee.  The  tibial base plate was applied.  Rotation coverage was set, reamer and  punch.  The femoral box cut was then applied and cut in a standard  fashion.  At this time, the 2.5 tibia and 2.5 femur and 12.5 tibial  insert with excellent range of motion to varus and valgus stress.  Flexion and extension gaps were well balanced.  It had excellent flexion  and extension and alignment.  The patella was found to be a size 32.  Appropriate amount of bone was resected.  Locking holes were made.  Patellar tracking was anatomic with patellar button.  Trials were then  removed.  At this point in time, the knee was irrigated with pulsatile   lavage, utilizing modern cement technique.  All components were cemented  in place, a size 2.5 tibia, 2.5 femur, a 12.5 trial with a tibial insert  and a 32 patella.  After cement had cured, excess cement was removed.  The knee was again copiously irrigated.  We were well-balanced with the  12.5.  The trial was removed and irrigated.  The tibia was subluxed  anteriorly, and the final 12.5 mg posterior stabilized tibial insert was  implanted.  We will now check the knee.  We had excellent flexion and  extension gaps.  She was stable to varus and valgus stress at 0 and 90  degrees of flexion.  She had full extension and flexion at 110 degrees,  limited by the drapes.  The patellofemoral tracking was anatomic.  The  knee was again irrigated.  Two medium Hemovac drains were placed.  A  sequential closure of layers were done.  __________  closed with two  layers of Vicryl due to her deep adipose tissue anteriorly.  The skin  was closed with subcu Monocryl suture.  The drain is hooked to suction.  Sterile dressing is applied.  The tourniquet deflated.  Normal  circulation of the foot and ankle at the end of the case.  She tolerated  the procedure well.  There were no complications.  Sponge and needle  count were correct.  She is taken to the operating room and PACU in  stable condition.   To help with surgical technique and decision-making, especially due to  the large size of her thigh, it was very much necessary to have Mr.  Brett Canales Chabon's, PA-C, needed throughout the entire case.           ______________________________  Erasmo Leventhal, M.D.     RAC/MEDQ  D:  09/16/2006  T:  09/17/2006  Job:  [id number]

## 2011-03-06 NOTE — H&P (Signed)
NAMEMEREDETH, FURBER          ACCOUNT NO.:  [id number]   MEDICAL RECORD NO.:  [id number]          PATIENT TYPE:  AMB   LOCATION:  DAY                          FACILITY:  [facility]   PHYSICIAN:  Naima A. Dillard, M.D. DATE OF BIRTH:  November 21, 1955   DATE OF ADMISSION:  DATE OF DISCHARGE:                                HISTORY & PHYSICAL   CHIEF COMPLAINT:  Symptomatic fibroids and left inguinal hernia.   HISTORY OF PRESENT ILLNESS:  The patient presented in February of 2006  complaining of abdominal pain and soreness mainly in the left lower  quadrant.  She did not have any fevers, nausea, vomiting.  No UTI symptoms,  diarrhea, or constipation, and no history of endometriosis.  The patient did  have a left ovarian complex cyst which resolved on ultrasound, but her pain  remained.  She also has an ultrasound significant for fibroids.  All  treatments for fibroids were reviewed with the patient and the patient did  go for a consultation for uterine artery embolization and they felt like she  would be a better candidate for a myomectomy.  The patient has denied any  irregular vaginal bleeding.   PAST MEDICAL HISTORY:  Significant for chronic hypertension, urge  incontinence, and arthritis.   PAST GYN HISTORY:  Significant for Trichomonas and gonorrhea.  Also had a  D&C years ago.   ALLERGIES:  CODEINE, DARVOCET cause nausea and vomiting.   MEDICATIONS:  Diovan.   PAST SURGICAL HISTORY:  Significant for cesarean section x1 in 1981.  D&C.  Achilles tendon repair.   FAMILY HISTORY:  Significant for diabetes.  No GYN cancer.   REVIEW OF SYSTEMS:  MUSCULOSKELETAL:  Unremarkable.  CARDIOVASCULAR:  Significant for hypertension.  RESPIRATORY:  Unremarkable.  GASTROINTESTINAL:  Unremarkable.  GENITOURINARY:  Symptomatic fibroids.   PHYSICAL EXAMINATION:  VITAL SIGNS:  Blood pressure 130/80, weight 215  pounds.  GENERAL:  The patient is in no acute distress.  HEART:  Regular rate and  rhythm.  LUNGS:  Clear to auscultation bilaterally.  ABDOMEN:  Nondistended, soft, and nontender.  PELVIC:  Vaginal examination is within normal limits.  No lesions noted.  No  CMT.  Uterus is about 14 weeks size and irregular.  EXTREMITIES:  No cyanosis, clubbing, or edema.   On ultrasound in February of 2006, the uterus measured 14.6 x 7.2 x 9.1 with  two large fibroids, one measuring 4 x 3 and the other measuring 6 x 4 with a  right hydrosalpinx.  MRI of the uterus more recently showed the uterus  measured 16.9 x 7.1 cm with one fibroid measuring 9 x 8 cm and a left  inguinal hernia.  Endometrial biopsy done in July of 2006 showed benign  proliferative endometrium.   ASSESSMENT:  Abdominal pain presumed from the fibroids and left inguinal  hernia.   PLAN:  Myomectomy.  The patient understands the risks are, but not limited  to possible hysterectomy if bleeding occurs and cannot be made hemostatic,  damage to internal organs such as bowel, bladder, major blood vessels, and  ureters, and problems from anesthesia.  The patient also was  seen by Dr.  Lurene Shadow and will have an inguinal hernia repair.  Risks and benefits were  discussed with him.      Naima A. Normand Sloop, M.D.  Electronically Signed     NAD/MEDQ  D:  08/20/2005  T:  08/20/2005  Job:  [id number]

## 2011-03-06 NOTE — Discharge Summary (Signed)
[name], [name]          ACCOUNT NO.:  [id number]   MEDICAL RECORD NO.:  [id number]          PATIENT TYPE:  INP   LOCATION:  1605                         [facility]ACILITY:  [facility]   PHYSICIAN:  Naima A. Dillard, M.D. DATE OF BIRTH:  [birth year]   DATE OF ADMISSION:  08/21/2005  DATE OF DISCHARGE:  08/23/2005                                 DISCHARGE SUMMARY   ADDITIONAL DIAGNOSES:  1.  Symptomatic fibroids.  2.  Left inguinal hernia.   DISCHARGE DIAGNOSES:  Postoperative day #2 status post abdominal myomectomy,  right ovarian cystectomy and left inguinal hernia repair.   DISCHARGE MEDICATIONS:  1.  Motrin one tablet q.8 h p.r.n. pain.  2.  Dilaudid 1-2 tablets q.3 h p.r.n. pain.  3.  Colace one tablet b.i.d.  4.  Diovan HCT one tablet each day.   HOSPITAL COURSE:  The patient, on August 21, 2005, underwent an abdominal  myomectomy, right ovarian cystectomy and left inguinal hernia repair for  symptomatic fibroids, right ovarian cyst and left inguinal hernia.  Directly  postop, the patient remained afebrile.  She did have some nausea.  Had great  urine output and was given Phenergan for the nausea.  On postoperative day  #2, the patient remained afebrile.  She was tolerating clear liquids.  Abdomen was soft and nontender.  She had minimal vaginal bleeding.   PERTINENT LABORATORY DATA:  Postop hemoglobin was 9.9, preop was 11.7.  Platelets 263,000, white count was 14.2.  Her chemistry panel was felt to be  within normal limits.  Her glucose was just a little bit elevated.   HOSPITAL COURSE:  Postoperative day #3, the patient was tolerating a regular  diet and had some flatus.  She was afebrile stable vital signs.  Abdomen was  soft and nondistended.  Incision was clean, dry and intact.  She had minimal  vaginal bleeding.  Extremities had no cyanosis, clubbing or edema.  The  patient was deemed to benefit from her hospital stay.  She wanted to have a  BM, so she was given the  Dulcolax suppository.  She was given discharge  instructions to call for temperature greater than 100, any erythema or  oozing from the incision, any chest pain or shortness of breath or heavy  vaginal bleeding.  The patient will follow up with me in six weeks.  She was  discharged in stable condition.  I will also check her for her fasting blood  sugar in the office when she comes in for her evaluation.      Naima A. Normand Sloop, M.D.  Electronically Signed     NAD/MEDQ  D:  08/23/2005  T:  08/24/2005  Job:  [id number]

## 2011-03-06 NOTE — H&P (Signed)
[name], [name]          ACCOUNT NO.:  [id number]   MEDICAL RECORD NO.:  [id number]          PATIENT TYPE:   LOCATION:                                 FACILITY:   PHYSICIAN:  [name], M.D.DATE OF BIRTH:  1955/12/10   DATE OF ADMISSION:  09/16/2006  DATE OF DISCHARGE:                              HISTORY & PHYSICAL   CHIEF COMPLAINT:  Left knee osteoarthritis.   HISTORY OF PRESENT ILLNESS:  This is a 55 year old lady well known to [place]  with a history of end-stage osteoarthritis of the left knee which has  failed conservative management to relieve her pain.  Due to difficulty  with daily activities, pain, swelling, the patient is now scheduled for  total knee arthroplasty of the left knee.  The surgery, risks, benefits,  and aftercare were discussed in detail with the patient questions were  invited and answered, and surgery to go ahead as scheduled.  She has had  medical clearance from her medical doctor who is Dr. Oliver Barre.   PAST MEDICAL HISTORY:  DRUG ALLERGIES TO DARVON AND CODEINE WITH NAUSEA  AND VOMITING.   CURRENT MEDICATIONS:  Micardis and tetracycline.  She is not sure of the  dose and is advised to bring the medications to the hospital with her so  we can get the correct dosage.   PREVIOUS SURGERIES:  1. Bilateral Achilles repair.  2. Breast cyst excision.  3. Herniorrhaphy.  4. C-section.  5. Fibroid excision.   SERIOUS MEDICAL ILLNESSES:  Hypertension.   FAMILY HISTORY:  Positive for diabetes and hypertension.   SOCIAL HISTORY:  The patient is single.  She works as a Child psychotherapist.  She does not smoke, and drinks occasionally.   REVIEW OF SYSTEMS:  CENTRAL NERVOUS SYSTEM:  Negative for headache,  blurred vision, or dizziness.  PULMONARY: Positive for exertional  shortness of breath.  Negative for PND and orthopnea.  CARDIOVASCULAR:  Negative for chest pain or palpitations.  GI: Negative for ulcers or  hepatitis.  GU: Negative for  urinary tract difficulty.  MUSCULOSKELETAL:  Positive as in HPI.   PHYSICAL EXAMINATION:  VITAL SIGNS:  BP 140/92, respirations 16, pulse  72 and regular.  GENERAL APPEARANCE:  This an obese lady in no acute distress.  HEENT:  Head normocephalic.  Nose patent.  Ears patent.  Pupils equal,  round, react to light.  Throat without injection.  NECK: Supple without  adenopathy.  Carotids 2+ without bruit.  CHEST: Clear to auscultation.  No rales or rhonchi.  Respirations 16.  HEART:  Regular rate and rhythm at 72 beats per minute without murmur.  ABDOMEN: Soft with active bowel sounds.  No masses or organomegaly.  NEUROLOGIC: The patient alert and oriented to time, place, and person.  Cranial nerves II-XII are grossly intact.  EXTREMITIES:  Extremities  show bilateral varus deformity.  The left knee shows 2-130 degree range  of motion.  Neurovascular status is intact.  Dorsalis pedis and  posterior tibialis pulses are 2+.  X-ray shows end-stage osteoarthritis  of the left knee.   IMPRESSION:  End-stage osteoarthritis of the left knee.  PLAN:  Total knee arthroplasty of the left knee.      Jaquelyn Bitter. Jacqueline, P.A.    ______________________________  [name], M.D.    SJC/MEDQ  D:  08/23/2006  T:  08/24/2006  Job:  [id number]

## 2011-03-13 ENCOUNTER — Other Ambulatory Visit: Payer: Self-pay | Admitting: Oncology

## 2011-03-13 ENCOUNTER — Encounter (HOSPITAL_BASED_OUTPATIENT_CLINIC_OR_DEPARTMENT_OTHER): Payer: 59 | Admitting: Oncology

## 2011-03-13 DIAGNOSIS — C50119 Malignant neoplasm of central portion of unspecified female breast: Secondary | ICD-10-CM

## 2011-03-13 LAB — COMPREHENSIVE METABOLIC PANEL
ALT: 14 U/L (ref 0–35)
AST: 15 U/L (ref 0–37)
Albumin: 3.8 g/dL (ref 3.5–5.2)
Alkaline Phosphatase: 42 U/L (ref 39–117)
BUN: 10 mg/dL (ref 6–23)
CO2: 23 mEq/L (ref 19–32)
Calcium: 9.3 mg/dL (ref 8.4–10.5)
Chloride: 101 mEq/L (ref 96–112)
Creatinine, Ser: 0.72 mg/dL (ref 0.40–1.20)
Glucose, Bld: 92 mg/dL (ref 70–99)
Potassium: 4.3 mEq/L (ref 3.5–5.3)
Sodium: 136 mEq/L (ref 135–145)
Total Bilirubin: 0.3 mg/dL (ref 0.3–1.2)
Total Protein: 7.3 g/dL (ref 6.0–8.3)

## 2011-03-13 LAB — CBC WITH DIFFERENTIAL/PLATELET
BASO%: 0.2 % (ref 0.0–2.0)
Basophils Absolute: 0 10*3/uL (ref 0.0–0.1)
EOS%: 1.7 % (ref 0.0–7.0)
Eosinophils Absolute: 0.1 10*3/uL (ref 0.0–0.5)
HCT: 34.8 % (ref 34.8–46.6)
HGB: 11.3 g/dL — ABNORMAL LOW (ref 11.6–15.9)
LYMPH%: 26.5 % (ref 14.0–49.7)
MCH: 27.8 pg (ref 25.1–34.0)
MCHC: 32.5 g/dL (ref 31.5–36.0)
MCV: 85.5 fL (ref 79.5–101.0)
MONO#: 0.8 10*3/uL (ref 0.1–0.9)
MONO%: 12.1 % (ref 0.0–14.0)
NEUT#: 3.9 10*3/uL (ref 1.5–6.5)
NEUT%: 59.5 % (ref 38.4–76.8)
Platelets: 304 10*3/uL (ref 145–400)
RBC: 4.07 10*6/uL (ref 3.70–5.45)
RDW: 14 % (ref 11.2–14.5)
WBC: 6.6 10*3/uL (ref 3.9–10.3)
lymph#: 1.8 10*3/uL (ref 0.9–3.3)

## 2011-03-13 LAB — CANCER ANTIGEN 27.29: CA 27.29: 22 U/mL (ref 0–39)

## 2011-03-20 ENCOUNTER — Ambulatory Visit
Admission: RE | Admit: 2011-03-20 | Discharge: 2011-03-20 | Disposition: A | Discharge status: Home / Self Care | Payer: 59 | Source: Ambulatory Visit | Attending: Radiation Oncology | Admitting: Radiation Oncology

## 2011-03-20 DIAGNOSIS — Z170 Estrogen receptor positive status [ER+]: Secondary | ICD-10-CM | POA: Insufficient documentation

## 2011-03-20 DIAGNOSIS — Y842 Radiological procedure and radiotherapy as the cause of abnormal reaction of the patient, or of later complication, without mention of misadventure at the time of the procedure: Secondary | ICD-10-CM | POA: Insufficient documentation

## 2011-03-20 DIAGNOSIS — L988 Other specified disorders of the skin and subcutaneous tissue: Secondary | ICD-10-CM | POA: Insufficient documentation

## 2011-03-20 DIAGNOSIS — C50919 Malignant neoplasm of unspecified site of unspecified female breast: Secondary | ICD-10-CM | POA: Insufficient documentation

## 2011-03-20 DIAGNOSIS — Z510 Encounter for antineoplastic radiation therapy: Secondary | ICD-10-CM | POA: Insufficient documentation

## 2011-03-28 ENCOUNTER — Encounter: Payer: Self-pay | Admitting: Gastroenterology

## 2011-03-31 ENCOUNTER — Ambulatory Visit: Payer: 59 | Admitting: Radiation Oncology

## 2011-04-24 ENCOUNTER — Ambulatory Visit (HOSPITAL_COMMUNITY)
Admission: RE | Admit: 2011-04-24 | Discharge: 2011-04-24 | Disposition: A | Discharge status: Home / Self Care | Payer: 59 | Source: Ambulatory Visit | Attending: Radiation Oncology | Admitting: Radiation Oncology

## 2011-04-24 DIAGNOSIS — M79609 Pain in unspecified limb: Secondary | ICD-10-CM

## 2011-05-11 ENCOUNTER — Encounter (HOSPITAL_BASED_OUTPATIENT_CLINIC_OR_DEPARTMENT_OTHER): Payer: 59 | Admitting: Oncology

## 2011-05-11 ENCOUNTER — Other Ambulatory Visit: Payer: Self-pay | Admitting: Oncology

## 2011-05-11 DIAGNOSIS — I10 Essential (primary) hypertension: Secondary | ICD-10-CM

## 2011-05-11 DIAGNOSIS — Z96659 Presence of unspecified artificial knee joint: Secondary | ICD-10-CM

## 2011-05-11 DIAGNOSIS — E669 Obesity, unspecified: Secondary | ICD-10-CM

## 2011-05-11 DIAGNOSIS — C50119 Malignant neoplasm of central portion of unspecified female breast: Secondary | ICD-10-CM

## 2011-05-11 LAB — CBC WITH DIFFERENTIAL/PLATELET
BASO%: 0.3 % (ref 0.0–2.0)
Basophils Absolute: 0 10*3/uL (ref 0.0–0.1)
EOS%: 2.1 % (ref 0.0–7.0)
Eosinophils Absolute: 0.1 10*3/uL (ref 0.0–0.5)
HCT: 30 % — ABNORMAL LOW (ref 34.8–46.6)
HGB: 10.1 g/dL — ABNORMAL LOW (ref 11.6–15.9)
LYMPH%: 17.1 % (ref 14.0–49.7)
MCH: 29.2 pg (ref 25.1–34.0)
MCHC: 33.7 g/dL (ref 31.5–36.0)
MCV: 86.6 fL (ref 79.5–101.0)
MONO#: 0.6 10*3/uL (ref 0.1–0.9)
MONO%: 11.2 % (ref 0.0–14.0)
NEUT#: 3.6 10*3/uL (ref 1.5–6.5)
NEUT%: 69.3 % (ref 38.4–76.8)
Platelets: 213 10*3/uL (ref 145–400)
RBC: 3.46 10*6/uL — ABNORMAL LOW (ref 3.70–5.45)
RDW: 14.4 % (ref 11.2–14.5)
WBC: 5.2 10*3/uL (ref 3.9–10.3)
lymph#: 0.9 10*3/uL (ref 0.9–3.3)

## 2011-05-12 LAB — COMPREHENSIVE METABOLIC PANEL
ALT: 13 U/L (ref 0–35)
AST: 17 U/L (ref 0–37)
Albumin: 3.5 g/dL (ref 3.5–5.2)
Alkaline Phosphatase: 40 U/L (ref 39–117)
BUN: 13 mg/dL (ref 6–23)
CO2: 23 mEq/L (ref 19–32)
Calcium: 8.8 mg/dL (ref 8.4–10.5)
Chloride: 104 mEq/L (ref 96–112)
Creatinine, Ser: 0.69 mg/dL (ref 0.50–1.10)
Glucose, Bld: 75 mg/dL (ref 70–99)
Potassium: 3.8 mEq/L (ref 3.5–5.3)
Sodium: 138 mEq/L (ref 135–145)
Total Bilirubin: 0.2 mg/dL — ABNORMAL LOW (ref 0.3–1.2)
Total Protein: 6.7 g/dL (ref 6.0–8.3)

## 2011-05-12 LAB — VITAMIN D 25 HYDROXY (VIT D DEFICIENCY, FRACTURES): Vit D, 25-Hydroxy: 18 ng/mL — ABNORMAL LOW (ref 30–89)

## 2011-05-12 LAB — CANCER ANTIGEN 27.29: CA 27.29: 20 U/mL (ref 0–39)

## 2011-05-18 ENCOUNTER — Encounter (HOSPITAL_BASED_OUTPATIENT_CLINIC_OR_DEPARTMENT_OTHER): Payer: 59 | Admitting: Oncology

## 2011-05-18 DIAGNOSIS — C50119 Malignant neoplasm of central portion of unspecified female breast: Secondary | ICD-10-CM

## 2011-06-12 ENCOUNTER — Encounter (INDEPENDENT_AMBULATORY_CARE_PROVIDER_SITE_OTHER): Payer: Self-pay | Admitting: General Surgery

## 2011-06-12 ENCOUNTER — Ambulatory Visit (INDEPENDENT_AMBULATORY_CARE_PROVIDER_SITE_OTHER): Payer: 59 | Admitting: General Surgery

## 2011-06-12 VITALS — BP 128/84 | HR 60

## 2011-06-12 DIAGNOSIS — C50919 Malignant neoplasm of unspecified site of unspecified female breast: Secondary | ICD-10-CM

## 2011-06-12 DIAGNOSIS — C50112 Malignant neoplasm of central portion of left female breast: Secondary | ICD-10-CM | POA: Insufficient documentation

## 2011-06-12 DIAGNOSIS — C50912 Malignant neoplasm of unspecified site of left female breast: Secondary | ICD-10-CM

## 2011-06-12 NOTE — Progress Notes (Signed)
Chief Complaint  Patient presents with  . Follow-up    po-reck left lumpectomy and lymph nodes     HISTORY: Pt is doing better.  She is still fatigued after radiation.  She has some skin changes after the boost treatment at the nipple area.  She has been applying the radiation gel.  She denies new masses or discharge.  She is planning to start tamoxifen next month.    Past Medical History  Diagnosis Date  . HYPERLIPIDEMIA 05/11/2008  . ANEMIA-IRON DEFICIENCY 05/11/2008  . GERD 05/11/2008  . OBSTRUCTIVE SLEEP APNEA 05/11/2008  . 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 RASH-NONVESICULAR 06/29/2008  . COLONIC POLYPS, HX OF 05/11/2008  . GENITAL HERPES, HX OF 08/08/2009  . Inverted nipple      Current Outpatient Prescriptions  Medication Sig Dispense Refill  . Ferrous Sulfate (IRON) 325 (65 FE) MG TABS Take by mouth daily.        [name] telmisartan-hydrochlorothiazide (MICARDIS HCT) 80-25 MG per tablet Take 1 tablet by mouth daily.  30 tablet  0  . telmisartan-hydrochlorothiazide (MICARDIS HCT) 80-25 MG per tablet Take 1 tablet by mouth daily.  90 tablet  3  . valACYclovir (VALTREX) 1000 MG tablet Take 1,000 mg by mouth daily.        . DULoxetine (CYMBALTA) 60 MG capsule Take 1 capsule (60 mg total) by mouth daily.  30 capsule  11  . triamcinolone (KENALOG) 0.1 % cream Apply topically 2 (two) times daily.  30 g  0     Allergies  Allergen Reactions  . Codeine   . Propoxyphene Hcl      Family History  Problem Relation Age of Onset  . Diabetes Mother   . Hypertension Mother   . Stroke Mother   . Heart disease Father     COPD  . Alcohol abuse Father     ETOH dependence     History   Social History  . Marital Status: Divorced    Spouse Name: N/A    Number of Children: 1  . 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 Smoking status: Never Smoker   . Smokeless tobacco: None  . Alcohol Use: Yes  . Drug Use: No  .  Sexually Active: None   Other Topics Concern  . None   Social History Narrative   1 son deceased with homicidePatient has been divorced twice     REVIEW OF SYSTEMS - PERTINENT POSITIVES ONLY: Otherwise negative times 11 systems.     EXAM: Filed Vitals:   06/12/11 1226  BP: 128/84  Pulse: 60     Head: Normocephalic and atraumatic.  Eyes: Conjunctivae are normal. Pupils are equal, round, and reactive to light. No scleral icterus.  Neck: Normal range of motion. Neck supple. No tracheal deviation present. No thyromegaly present.  Cardiovascular: Normal rate, intact distal pulses.   Respiratory: Effort normal. No respiratory distress. No wheezes, rales or rhonchi, no chest wall tenderness.  L breast with skin changes to nipple and supra-areolar skin.  GI: Soft.  The abdomen is soft and nontender.  There is no rebound and no guarding.  Musculoskeletal: Normal range of motion. Extremities are nontender.  Neurological: Alert and oriented to person, place, and time. Coordination normal.  Skin: Skin is warm and dry. No rash noted. No diaphoresis. No erythema. No pallor.  Psychiatric: Normal mood and affect. Behavior is normal. Judgment and thought content normal.  L breast cancer, pT1c, N64mic, Mx, s/p L BCT 02/10/2011, s/p XRT Pt declined chemo for minimal improvement in recurrence risk. Advised to contact Dr. Michell Heinrich if skin changes do not improve over next 4-6 weeks. Starting tamoxifen next month. Follow up with me in 6 months.        Maudry Diego MD Surgical Oncology, General and Endocrine Surgery [facility]      Visit Diagnoses: 1. L breast cancer, pT1c, N57mic, Mx, s/p L BCT 02/10/2011, s/p XRT     Primary Care Physician: Oliver Barre, MD, MD

## 2011-06-12 NOTE — Assessment & Plan Note (Signed)
Pt declined chemo for minimal improvement in recurrence risk. Advised to contact Dr. Michell Heinrich if skin changes do not improve over next 4-6 weeks. Starting tamoxifen next month. Follow up with me in 6 months.

## 2011-07-09 ENCOUNTER — Ambulatory Visit
Admission: RE | Admit: 2011-07-09 | Discharge: 2011-07-09 | Disposition: A | Discharge status: Home / Self Care | Payer: 59 | Source: Ambulatory Visit | Attending: Radiation Oncology | Admitting: Radiation Oncology

## 2011-08-14 ENCOUNTER — Ambulatory Visit: Payer: 59 | Admitting: Internal Medicine

## 2011-08-18 ENCOUNTER — Other Ambulatory Visit (INDEPENDENT_AMBULATORY_CARE_PROVIDER_SITE_OTHER): Payer: 59

## 2011-08-18 ENCOUNTER — Encounter: Payer: Self-pay | Admitting: Internal Medicine

## 2011-08-18 ENCOUNTER — Ambulatory Visit (INDEPENDENT_AMBULATORY_CARE_PROVIDER_SITE_OTHER)
Admission: RE | Admit: 2011-08-18 | Discharge: 2011-08-18 | Disposition: A | Discharge status: Home / Self Care | Payer: 59 | Source: Ambulatory Visit | Attending: Internal Medicine | Admitting: Internal Medicine

## 2011-08-18 ENCOUNTER — Ambulatory Visit (INDEPENDENT_AMBULATORY_CARE_PROVIDER_SITE_OTHER): Payer: 59 | Admitting: Internal Medicine

## 2011-08-18 VITALS — BP 110/70 | HR 69 | Temp 98.2°F | Ht <= 58 in | Wt 236.1 lb

## 2011-08-18 DIAGNOSIS — R0609 Other forms of dyspnea: Secondary | ICD-10-CM | POA: Insufficient documentation

## 2011-08-18 DIAGNOSIS — R0989 Other specified symptoms and signs involving the circulatory and respiratory systems: Secondary | ICD-10-CM

## 2011-08-18 DIAGNOSIS — R0600 Dyspnea, unspecified: Secondary | ICD-10-CM

## 2011-08-18 DIAGNOSIS — E785 Hyperlipidemia, unspecified: Secondary | ICD-10-CM

## 2011-08-18 DIAGNOSIS — M797 Fibromyalgia: Secondary | ICD-10-CM

## 2011-08-18 DIAGNOSIS — IMO0001 Reserved for inherently not codable concepts without codable children: Secondary | ICD-10-CM

## 2011-08-18 DIAGNOSIS — R7302 Impaired glucose tolerance (oral): Secondary | ICD-10-CM

## 2011-08-18 DIAGNOSIS — Z23 Encounter for immunization: Secondary | ICD-10-CM

## 2011-08-18 DIAGNOSIS — R7309 Other abnormal glucose: Secondary | ICD-10-CM

## 2011-08-18 DIAGNOSIS — Z0000 Encounter for general adult medical examination without abnormal findings: Secondary | ICD-10-CM

## 2011-08-18 LAB — LIPID PANEL
Cholesterol: 157 mg/dL (ref 0–200)
HDL: 59 mg/dL (ref >39.00)
LDL Cholesterol: 79 mg/dL (ref 0–99)
Total CHOL/HDL Ratio: 3
Triglycerides: 96 mg/dL (ref 0.0–149.0)
VLDL: 19.2 mg/dL (ref 0.0–40.0)

## 2011-08-18 LAB — BASIC METABOLIC PANEL
BUN: 14 mg/dL (ref 6–23)
CO2: 27 mEq/L (ref 19–32)
Calcium: 9.2 mg/dL (ref 8.4–10.5)
Chloride: 105 mEq/L (ref 96–112)
Creatinine, Ser: 0.6 mg/dL (ref 0.4–1.2)
GFR: 139.91 mL/min (ref >60.00)
Glucose, Bld: 87 mg/dL (ref 70–99)
Potassium: 3.2 mEq/L — ABNORMAL LOW (ref 3.5–5.1)
Sodium: 141 mEq/L (ref 135–145)

## 2011-08-18 LAB — HEMOGLOBIN A1C: Hgb A1c MFr Bld: 6.4 % (ref 4.6–6.5)

## 2011-08-18 NOTE — Patient Instructions (Signed)
You had the flu shot today Your EKG was ok today Please go to LAB in the Basement for the blood and/or urine tests to be done today Please go to XRAY in the Basement for the x-ray test Please call the phone number [phone] (the PhoneTree System) for results of testing in 2-3 days;  When calling, simply dial the number, and when prompted enter the MRN number above (the Medical Record Number) and the # key, then the message should start. You will be contacted regarding the referral for: echocardiogram, and lung testing Please return in 6 mo with Lab testing done 3-5 days before

## 2011-08-18 NOTE — Assessment & Plan Note (Signed)
Unclear etiology, exam benign, ? Obesity related; ECG reviewed as per emr, for now with cxr, PFT's and echo, doubt PE,  to f/u any worsening symptoms or concerns

## 2011-08-19 ENCOUNTER — Other Ambulatory Visit: Payer: Self-pay | Admitting: Internal Medicine

## 2011-08-19 MED ORDER — POTASSIUM CHLORIDE ER 10 MEQ PO TBCR: 10.0000 meq | EXTENDED_RELEASE_TABLET | Freq: Two times a day (BID) | ORAL | Status: DC

## 2011-08-20 ENCOUNTER — Encounter: Payer: Self-pay | Admitting: Internal Medicine

## 2011-08-20 NOTE — Progress Notes (Signed)
Subjective:    Patient ID: [name], female    DOB: 1956/09/23, 55 y.o.   MRN: [id number]  HPI  Here to f/u; overall doing ok,  Pt denies chest pain, wheezing, orthopnea, PND, increased LE swelling, palpitations, dizziness or syncope, but does c/o approx 4 wks sense of DOE (no sob at ret) at 100 ft which is unusual.  Pt denies new neurological symptoms such as new headache, or facial or extremity weakness or numbness   Pt denies polydipsia, polyuria, or low sugar symptoms such as weakness or confusion improved with po intake.  Pt states overall good compliance with meds, trying to follow lower cholesterol diet, wt overall stable but little exercise however.  Overall stable pain with respect to her FMS, Does have sense of ongoing fatigue, but denies signficant hypersomnolence.  Could not tolerate the cymbalta. Past Medical History  Diagnosis Date  . HYPERLIPIDEMIA 05/11/2008  . ANEMIA-IRON DEFICIENCY 05/11/2008  . GERD 05/11/2008  . OBSTRUCTIVE SLEEP APNEA 05/11/2008  . 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 RASH-NONVESICULAR 06/29/2008  . COLONIC POLYPS, HX OF 05/11/2008  . GENITAL HERPES, HX OF 08/08/2009  . Inverted nipple    Past Surgical History  Procedure Date  . Breast cyst excision 1973  . Cesarean section   . Right achilles tendon     and left  . S/p ear surgury   . Right ovarian cyst     hx  . S/p extra uterine fibroid 2006  . S/p left knee replacement 2007    reports that she has never smoked. She does not have any smokeless tobacco history on file. She reports that she drinks alcohol. She reports that she does not use illicit drugs. family history includes Alcohol abuse in her father; Diabetes in her mother; Heart disease in her father; Hypertension in her mother; and Stroke in her mother. Allergies  Allergen Reactions  . Codeine   . Cymbalta (Duloxetine Hcl) Other (See Comments)    Head felt funny, ? thinking not right  . Propoxyphene Hcl    Current Outpatient  Prescriptions on File Prior to Visit  Medication Sig Dispense Refill  . Ferrous Sulfate (IRON) 325 (65 FE) MG TABS Take by mouth daily.        [name] triamcinolone (KENALOG) 0.1 % cream Apply topically 2 (two) times daily.  30 g  0  . valACYclovir (VALTREX) 1000 MG tablet Take 1,000 mg by mouth daily.        [name] telmisartan-hydrochlorothiazide (MICARDIS HCT) 80-25 MG per tablet Take 1 tablet by mouth daily.  90 tablet  3   Review of Systems Review of Systems  Constitutional: Negative for diaphoresis and unexpected weight change.  HENT: Negative for drooling and tinnitus.   Eyes: Negative for photophobia and visual disturbance.  Respiratory: Negative for choking and stridor.   Gastrointestinal: Negative for vomiting and blood in stool.  Genitourinary: Negative for hematuria and decreased urine volume.  Musculoskeletal: Negative for gait problem.     Objective:   Physical Exam BP 110/70  Pulse 69  Temp(Src) 98.2 F (36.8 C) (Oral)  Ht 4\' 9"  (1.448 m)  Wt 236 lb 2 oz (107.106 kg)  BMI 51.10 kg/m2  SpO2 96%  LMP 06/20/2011 Physical Exam  VS noted, not ill appearing Constitutional: Pt appears well-developed and well-nourished.  HENT: Head: Normocephalic.  Right Ear: External ear normal.  Left Ear: External ear normal.  Eyes: Conjunctivae and EOM are normal. Pupils are equal, round, and reactive to light.  Neck: Normal range of motion. Neck supple.  Cardiovascular: Normal rate and regular rhythm.   Pulmonary/Chest: Effort normal and breath sounds normal.  Abd:  Soft, NT, non-distended, + BS Neurological: Pt is alert. No cranial nerve deficit.  Skin: Skin is warm. No erythema.  Psychiatric: Pt behavior is normal. Thought content normal.  Spine:nontender    Assessment & Plan:

## 2011-08-20 NOTE — Assessment & Plan Note (Signed)
stable overall by hx and exam, most recent data reviewed with pt, and pt to continue medical treatment as before  Lab Results  Component Value Date   LDLCALC 79 08/18/2011

## 2011-08-20 NOTE — Assessment & Plan Note (Addendum)
stable overall by hx and exam, most recent data reviewed with pt, and pt to continue medical treatment as before, delcines other trial tx at this time  Lab Results  Component Value Date   WBC 6.2 02/13/2011   HGB 10.1* 05/11/2011   HCT 30.0* 05/11/2011   PLT 213 05/11/2011   GLUCOSE 87 08/18/2011   CHOL 157 08/18/2011   TRIG 96.0 08/18/2011   HDL 59.00 08/18/2011   LDLCALC 79 08/18/2011   ALT 13 05/11/2011   ALT 13 05/11/2011   ALT 13 05/11/2011   AST 17 05/11/2011   AST 17 05/11/2011   AST 17 05/11/2011   NA 141 08/18/2011   K 3.2* 08/18/2011   CL 105 08/18/2011   CREATININE 0.6 08/18/2011   BUN 14 08/18/2011   CO2 27 08/18/2011   TSH 2.11 02/13/2011   HGBA1C 6.4 08/18/2011

## 2011-08-20 NOTE — Assessment & Plan Note (Signed)
stable overall by hx and exam, most recent data reviewed with pt, and pt to continue medical treatment as before Lab Results  Component Value Date   HGBA1C 6.4 08/18/2011

## 2011-08-21 ENCOUNTER — Other Ambulatory Visit (HOSPITAL_COMMUNITY): Payer: 59 | Admitting: Radiology

## 2011-09-02 ENCOUNTER — Ambulatory Visit (HOSPITAL_BASED_OUTPATIENT_CLINIC_OR_DEPARTMENT_OTHER): Payer: 59 | Admitting: Oncology

## 2011-09-02 ENCOUNTER — Other Ambulatory Visit (HOSPITAL_BASED_OUTPATIENT_CLINIC_OR_DEPARTMENT_OTHER): Payer: 59 | Admitting: Lab

## 2011-09-02 ENCOUNTER — Other Ambulatory Visit: Payer: Self-pay | Admitting: Oncology

## 2011-09-02 VITALS — BP 140/81 | HR 77 | Temp 98.5°F | Ht <= 58 in | Wt 236.3 lb

## 2011-09-02 DIAGNOSIS — R5381 Other malaise: Secondary | ICD-10-CM

## 2011-09-02 DIAGNOSIS — Z170 Estrogen receptor positive status [ER+]: Secondary | ICD-10-CM

## 2011-09-02 DIAGNOSIS — C50919 Malignant neoplasm of unspecified site of unspecified female breast: Secondary | ICD-10-CM

## 2011-09-02 DIAGNOSIS — C50119 Malignant neoplasm of central portion of unspecified female breast: Secondary | ICD-10-CM

## 2011-09-02 DIAGNOSIS — R5383 Other fatigue: Secondary | ICD-10-CM

## 2011-09-02 LAB — CBC WITH DIFFERENTIAL/PLATELET
BASO%: 0.5 % (ref 0.0–2.0)
Basophils Absolute: 0 10*3/uL (ref 0.0–0.1)
EOS%: 1.7 % (ref 0.0–7.0)
Eosinophils Absolute: 0.1 10*3/uL (ref 0.0–0.5)
HCT: 32.5 % — ABNORMAL LOW (ref 34.8–46.6)
HGB: 10.7 g/dL — ABNORMAL LOW (ref 11.6–15.9)
LYMPH%: 25 % (ref 14.0–49.7)
MCH: 28.5 pg (ref 25.1–34.0)
MCHC: 32.9 g/dL (ref 31.5–36.0)
MCV: 86.7 fL (ref 79.5–101.0)
MONO#: 0.4 10*3/uL (ref 0.1–0.9)
MONO%: 9.7 % (ref 0.0–14.0)
NEUT#: 2.7 10*3/uL (ref 1.5–6.5)
NEUT%: 63.1 % (ref 38.4–76.8)
Platelets: 248 10*3/uL (ref 145–400)
RBC: 3.75 10*6/uL (ref 3.70–5.45)
RDW: 14.5 % (ref 11.2–14.5)
WBC: 4.3 10*3/uL (ref 3.9–10.3)
lymph#: 1.1 10*3/uL (ref 0.9–3.3)

## 2011-09-03 ENCOUNTER — Telehealth: Payer: Self-pay | Admitting: Oncology

## 2011-09-03 NOTE — Telephone Encounter (Signed)
Called pts home lmovm of appts in march2013 and to rtn call to confirm appt

## 2011-09-04 NOTE — Progress Notes (Signed)
ID: [name]   Interval History: [name] returns for followup of her breast cancer. She completed radiation in August and started tamoxifen in the first week in September. She generally did well with the radiation except that she was concerned the boost was "aimed for the wrong spot". She has discussed this extensively with Dr. Michell Young. She understands that she received irradiation she needed to the area that needed to be radiated. She still however a little bit shaky about all that  She is tolerating the tamoxifen well. Her period stopped mid-August, that's before she started the tamoxifen. She is having some hot flashes but does not want to take any medication for this at present. She denies any vaginal wetness or dryness or other side effects related to the medication.  ROS:  She describes herself as a moderately fatigued she has pain in her knees and legs which are crampy and achy and intermittent. She gets short of breath when walking up stairs. She has some urinary leakage issues. None of these are new problems and a detailed review of systems today was entirely stable otherwise  Medications: I have reviewed the patient's current medications.   Current Outpatient Prescriptions  Medication Sig Dispense Refill  . Ferrous Sulfate (IRON) 325 (65 FE) MG TABS Take by mouth daily.        . potassium chloride (K-DUR) 10 MEQ tablet Take 1 tablet (10 mEq total) by mouth 2 (two) times daily.  90 tablet  3  . telmisartan-hydrochlorothiazide (MICARDIS HCT) 80-25 MG per tablet Take 1 tablet by mouth daily.  90 tablet  3  . triamcinolone (KENALOG) 0.1 % cream Apply topically 2 (two) times daily.  30 g  0  . valACYclovir (VALTREX) 1000 MG tablet Take 1,000 mg by mouth daily.           Objective: Vital signs in last 24 hours: BP 140/81  Pulse 77  Temp 98.5 F (36.9 C)  Ht 4\' 10"  (1.473 m)  Wt 236 lb 4.8 oz (107.185 kg)  BMI 49.39 kg/m2  LMP 06/20/2011   Physical Exam:    Sclerae  unicteric  Oropharynx clear  No peripheral adenopathy  Lungs clear -- no rales or rhonchi  Heart regular rate and rhythm  Abdomen benign  MSK no focal spinal tenderness, no peripheral edema  Neuro nonfocal  Breast exam: Right breast no suspicious findings; left breast status post lumpectomy and radiation; other minimal radiation changes namely a little but of edema, no significant hyperpigmentation or erythema.  Lab Results:   CMP  Lab Results  Component Value Date   GLUCOSE 87 08/18/2011   CHOL 157 08/18/2011   TRIG 96.0 08/18/2011   HDL 59.00 08/18/2011   LDLCALC 79 08/18/2011   ALT 13 05/11/2011   ALT 13 05/11/2011   ALT 13 05/11/2011   AST 17 05/11/2011   AST 17 05/11/2011   AST 17 05/11/2011   NA 141 08/18/2011   K 3.2* 08/18/2011   CL 105 08/18/2011   CREATININE 0.6 08/18/2011   BUN 14 08/18/2011   CO2 27 08/18/2011   TSH 2.11 02/13/2011   HGBA1C 6.4 08/18/2011     Lab Results  Component Value Date   WBC 4.3 09/02/2011   HGB 10.7* 09/02/2011   HCT 32.5* 09/02/2011   MCV 86.7 09/02/2011   PLT 248 09/02/2011        Studies/Results: Repeat mammography is due April of 2013  Assessment: Soon-to-be 55 year old [place] woman status post left lumpectomy and  sentinel lymph node dissection April of 2012 for a 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 The patient's Oncotype DX score of 21 predicts a 13% risk of distant recurrence after 5 years of tamoxifen.  Plan: I think she is doing quite well with the tamoxifen and she will return to see me in March the plan at this point is to continue the tamoxifen at least a 2-3 years before considering switching to an aromatase inhibitor. She knows to call for any problems that may develop before  the next visit  [name] C 09/04/2011

## 2011-09-09 ENCOUNTER — Ambulatory Visit (HOSPITAL_COMMUNITY): Payer: 59 | Attending: Cardiovascular Disease | Admitting: Radiology

## 2011-09-09 ENCOUNTER — Other Ambulatory Visit (HOSPITAL_COMMUNITY): Payer: Self-pay | Admitting: Radiology

## 2011-09-09 DIAGNOSIS — R0600 Dyspnea, unspecified: Secondary | ICD-10-CM

## 2011-09-09 DIAGNOSIS — E785 Hyperlipidemia, unspecified: Secondary | ICD-10-CM | POA: Insufficient documentation

## 2011-09-09 DIAGNOSIS — R0609 Other forms of dyspnea: Secondary | ICD-10-CM | POA: Insufficient documentation

## 2011-09-09 DIAGNOSIS — I079 Rheumatic tricuspid valve disease, unspecified: Secondary | ICD-10-CM | POA: Insufficient documentation

## 2011-09-09 DIAGNOSIS — R0602 Shortness of breath: Secondary | ICD-10-CM

## 2011-09-09 DIAGNOSIS — R0989 Other specified symptoms and signs involving the circulatory and respiratory systems: Secondary | ICD-10-CM | POA: Insufficient documentation

## 2011-09-11 ENCOUNTER — Other Ambulatory Visit: Payer: Self-pay | Admitting: *Deleted

## 2011-09-11 DIAGNOSIS — C50119 Malignant neoplasm of central portion of unspecified female breast: Secondary | ICD-10-CM

## 2011-09-11 MED ORDER — TAMOXIFEN CITRATE 20 MG PO TABS: 20.0000 mg | ORAL_TABLET | Freq: Every day | ORAL | Status: DC

## 2011-10-27 DIAGNOSIS — L309 Dermatitis, unspecified: Secondary | ICD-10-CM | POA: Insufficient documentation

## 2011-11-17 DIAGNOSIS — R102 Pelvic and perineal pain: Secondary | ICD-10-CM | POA: Insufficient documentation

## 2011-11-17 DIAGNOSIS — M549 Dorsalgia, unspecified: Secondary | ICD-10-CM | POA: Insufficient documentation

## 2011-11-25 ENCOUNTER — Encounter: Payer: Self-pay | Admitting: Internal Medicine

## 2011-11-25 ENCOUNTER — Other Ambulatory Visit (INDEPENDENT_AMBULATORY_CARE_PROVIDER_SITE_OTHER): Payer: 59

## 2011-11-25 ENCOUNTER — Ambulatory Visit (INDEPENDENT_AMBULATORY_CARE_PROVIDER_SITE_OTHER): Payer: 59 | Admitting: Internal Medicine

## 2011-11-25 VITALS — BP 110/80 | HR 58 | Temp 98.4°F | Ht 60.0 in | Wt 231.5 lb

## 2011-11-25 DIAGNOSIS — R7309 Other abnormal glucose: Secondary | ICD-10-CM

## 2011-11-25 DIAGNOSIS — E876 Hypokalemia: Secondary | ICD-10-CM

## 2011-11-25 DIAGNOSIS — R7302 Impaired glucose tolerance (oral): Secondary | ICD-10-CM

## 2011-11-25 DIAGNOSIS — J209 Acute bronchitis, unspecified: Secondary | ICD-10-CM | POA: Insufficient documentation

## 2011-11-25 LAB — BASIC METABOLIC PANEL
BUN: 12 mg/dL (ref 6–23)
CO2: 26 mEq/L (ref 19–32)
Calcium: 9.1 mg/dL (ref 8.4–10.5)
Chloride: 106 mEq/L (ref 96–112)
Creatinine, Ser: 0.7 mg/dL (ref 0.4–1.2)
GFR: 114.38 mL/min (ref >60.00)
Glucose, Bld: 76 mg/dL (ref 70–99)
Potassium: 3.5 mEq/L (ref 3.5–5.1)
Sodium: 139 mEq/L (ref 135–145)

## 2011-11-25 MED ORDER — AZITHROMYCIN 250 MG PO TABS: ORAL_TABLET | ORAL | Status: DC

## 2011-11-25 MED ORDER — HYDROCODONE-HOMATROPINE 5-1.5 MG/5ML PO SYRP: 5.0000 mL | ORAL_SOLUTION | Freq: Four times a day (QID) | ORAL | Status: AC | PRN

## 2011-11-25 NOTE — Patient Instructions (Addendum)
Take all new medications as prescribed Continue all other medications as before, and OK to stay off the potassium for now Please go to LAB in the Basement for the blood and/or urine tests to be done today (the potassium level) Please call the phone number [phone] (the PhoneTree System) for results of testing in 2-3 days;  When calling, simply dial the number, and when prompted enter the MRN number above (the Medical Record Number) and the # key, then the message should start. You are given the work note today

## 2011-11-26 ENCOUNTER — Other Ambulatory Visit: Payer: Self-pay

## 2011-11-26 DIAGNOSIS — J209 Acute bronchitis, unspecified: Secondary | ICD-10-CM

## 2011-11-26 MED ORDER — AZITHROMYCIN 250 MG PO TABS: ORAL_TABLET | ORAL | Status: AC

## 2011-11-26 NOTE — Telephone Encounter (Signed)
Pt called stating that her Rx for Azithromycin was not sent to her pharmacy. Rx was sent to mail order instead of local. Rx re-sent, pt informed.

## 2011-11-28 ENCOUNTER — Encounter: Payer: Self-pay | Admitting: Internal Medicine

## 2011-11-28 NOTE — Assessment & Plan Note (Signed)
Mild to mod, for antibx course,  to f/u any worsening symptoms or concerns 

## 2011-11-28 NOTE — Progress Notes (Signed)
Subjective:    Patient ID: [name], female    DOB: Oct 20, 1955, 56 y.o.   MRN: [id number]  HPI  Here with acute onset mild to mod 2-3 days ST, HA, general weakness and malaise, with prod cough greenish sputum, but Pt denies chest pain, increased sob or doe, wheezing, orthopnea, PND, increased LE swelling, palpitations, dizziness or syncope.  Pt denies new neurological symptoms such as new headache, or facial or extremity weakness or numbness   Pt denies polydipsia, polyuria, states overall good compliance with meds, trying to follow lower cholesterol, diabetic diet, wt overall stable but little exercise however.   Had mild low K, asks for f/u.  Denies worsening depressive symptoms, suicidal ideation, or panic. Past Medical History  Diagnosis Date  . HYPERLIPIDEMIA 05/11/2008  . ANEMIA-IRON DEFICIENCY 05/11/2008  . GERD 05/11/2008  . OBSTRUCTIVE SLEEP APNEA 05/11/2008  . 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 RASH-NONVESICULAR 06/29/2008  . COLONIC POLYPS, HX OF 05/11/2008  . GENITAL HERPES, HX OF 08/08/2009  . Inverted nipple    Past Surgical History  Procedure Date  . Breast cyst excision 1973  . Cesarean section   . Right achilles tendon     and left  . S/p ear surgury   . Right ovarian cyst     hx  . S/p extra uterine fibroid 2006  . S/p left knee replacement 2007    reports that she has never smoked. She does not have any smokeless tobacco history on file. She reports that she drinks alcohol. She reports that she does not use illicit drugs. family history includes Alcohol abuse in her father; Diabetes in her mother; Heart disease in her father; Hypertension in her mother; and Stroke in her mother. Allergies  Allergen Reactions  . Codeine   . Cymbalta (Duloxetine Hcl) Other (See Comments)    Head felt funny, ? thinking not right  . Propoxyphene Hcl    Current Outpatient Prescriptions on File Prior to Visit  Medication Sig Dispense Refill  . tamoxifen (NOLVADEX) 20 MG  tablet Take 1 tablet (20 mg total) by mouth daily.  90 tablet  1  . telmisartan-hydrochlorothiazide (MICARDIS HCT) 80-25 MG per tablet Take 1 tablet by mouth daily.  90 tablet  3  . valACYclovir (VALTREX) 1000 MG tablet Take 1,000 mg by mouth daily.        . Ferrous Sulfate (IRON) 325 (65 FE) MG TABS Take by mouth daily.         Review of Systems Review of Systems  Constitutional: Negative for diaphoresis and unexpected weight change.  HENT: Negative for drooling and tinnitus.   Eyes: Negative for photophobia and visual disturbance.  Respiratory: Negative for choking and stridor.   Gastrointestinal: Negative for vomiting and blood in stool.  Genitourinary: Negative for hematuria and decreased urine volume.    Objective:   Physical Exam BP 110/80  Pulse 58  Temp(Src) 98.4 F (36.9 C) (Oral)  Ht 5' (1.524 m)  Wt 231 lb 8 oz (105.008 kg)  BMI 45.21 kg/m2  SpO2 91% Physical Exam  VS noted, mild ill Constitutional: Pt appears well-developed and well-nourished.  HENT: Head: Normocephalic.  Right Ear: External ear normal.  Left Ear: External ear normal.  Bilat tm's mild erythema.  Sinus nontender.  Pharynx mild erythema Eyes: Conjunctivae and EOM are normal. Pupils are equal, round, and reactive to light.  Neck: Normal range of motion. Neck supple.  Cardiovascular: Normal rate and regular rhythm.   Pulmonary/Chest: Effort normal and  breath sounds normal.  Neurological: Pt is alert. No cranial nerve deficit.  Skin: Skin is warm. No erythema.  Psychiatric: Pt behavior is normal. Thought content normal. 1+ nervous    Assessment & Plan:

## 2011-11-28 NOTE — Assessment & Plan Note (Signed)
?   spurios low K , for f/u today, Continue all other medications as before

## 2011-11-28 NOTE — Assessment & Plan Note (Signed)
stable overall by hx and exam, most recent data reviewed with pt, and pt to continue medical treatment as before Lab Results  Component Value Date   HGBA1C 6.4 08/18/2011    

## 2011-11-30 ENCOUNTER — Ambulatory Visit: Payer: 59 | Admitting: Physician Assistant

## 2011-11-30 ENCOUNTER — Other Ambulatory Visit: Payer: 59 | Admitting: Lab

## 2011-12-08 DIAGNOSIS — R102 Pelvic and perineal pain: Secondary | ICD-10-CM

## 2011-12-08 DIAGNOSIS — L309 Dermatitis, unspecified: Secondary | ICD-10-CM

## 2011-12-08 DIAGNOSIS — D219 Benign neoplasm of connective and other soft tissue, unspecified: Secondary | ICD-10-CM

## 2011-12-08 DIAGNOSIS — M549 Dorsalgia, unspecified: Secondary | ICD-10-CM

## 2011-12-08 DIAGNOSIS — D259 Leiomyoma of uterus, unspecified: Secondary | ICD-10-CM | POA: Insufficient documentation

## 2011-12-08 DIAGNOSIS — N920 Excessive and frequent menstruation with regular cycle: Secondary | ICD-10-CM

## 2011-12-28 ENCOUNTER — Other Ambulatory Visit (HOSPITAL_BASED_OUTPATIENT_CLINIC_OR_DEPARTMENT_OTHER): Payer: 59 | Admitting: Lab

## 2011-12-28 ENCOUNTER — Ambulatory Visit (HOSPITAL_BASED_OUTPATIENT_CLINIC_OR_DEPARTMENT_OTHER): Payer: 59 | Admitting: Physician Assistant

## 2011-12-28 ENCOUNTER — Encounter: Payer: Self-pay | Admitting: Physician Assistant

## 2011-12-28 ENCOUNTER — Telehealth: Payer: Self-pay | Admitting: Oncology

## 2011-12-28 DIAGNOSIS — C50919 Malignant neoplasm of unspecified site of unspecified female breast: Secondary | ICD-10-CM

## 2011-12-28 DIAGNOSIS — N6310 Unspecified lump in the right breast, unspecified quadrant: Secondary | ICD-10-CM

## 2011-12-28 DIAGNOSIS — C50912 Malignant neoplasm of unspecified site of left female breast: Secondary | ICD-10-CM

## 2011-12-28 DIAGNOSIS — N63 Unspecified lump in unspecified breast: Secondary | ICD-10-CM

## 2011-12-28 DIAGNOSIS — L989 Disorder of the skin and subcutaneous tissue, unspecified: Secondary | ICD-10-CM

## 2011-12-28 LAB — CBC WITH DIFFERENTIAL/PLATELET
BASO%: 1.4 % (ref 0.0–2.0)
Basophils Absolute: 0.1 10*3/uL (ref 0.0–0.1)
EOS%: 1.4 % (ref 0.0–7.0)
Eosinophils Absolute: 0.1 10*3/uL (ref 0.0–0.5)
HCT: 35.3 % (ref 34.8–46.6)
HGB: 11.6 g/dL (ref 11.6–15.9)
LYMPH%: 25.5 % (ref 14.0–49.7)
MCH: 28.4 pg (ref 25.1–34.0)
MCHC: 33 g/dL (ref 31.5–36.0)
MCV: 86.3 fL (ref 79.5–101.0)
MONO#: 0.5 10*3/uL (ref 0.1–0.9)
MONO%: 11.2 % (ref 0.0–14.0)
NEUT#: 2.7 10*3/uL (ref 1.5–6.5)
NEUT%: 60.5 % (ref 38.4–76.8)
Platelets: 240 10*3/uL (ref 145–400)
RBC: 4.09 10*6/uL (ref 3.70–5.45)
RDW: 14.9 % — ABNORMAL HIGH (ref 11.2–14.5)
WBC: 4.5 10*3/uL (ref 3.9–10.3)
lymph#: 1.1 10*3/uL (ref 0.9–3.3)

## 2011-12-28 LAB — COMPREHENSIVE METABOLIC PANEL
ALT: 12 U/L (ref 0–35)
AST: 14 U/L (ref 0–37)
Albumin: 3.9 g/dL (ref 3.5–5.2)
Alkaline Phosphatase: 35 U/L — ABNORMAL LOW (ref 39–117)
BUN: 12 mg/dL (ref 6–23)
CO2: 26 mEq/L (ref 19–32)
Calcium: 9.1 mg/dL (ref 8.4–10.5)
Chloride: 105 mEq/L (ref 96–112)
Creatinine, Ser: 0.62 mg/dL (ref 0.50–1.10)
Glucose, Bld: 72 mg/dL (ref 70–99)
Potassium: 3.9 mEq/L (ref 3.5–5.3)
Sodium: 141 mEq/L (ref 135–145)
Total Bilirubin: 0.2 mg/dL — ABNORMAL LOW (ref 0.3–1.2)
Total Protein: 7.5 g/dL (ref 6.0–8.3)

## 2011-12-28 LAB — LUTEINIZING HORMONE: LH: 5.3 m[IU]/mL

## 2011-12-28 LAB — FOLLICLE STIMULATING HORMONE: FSH: 10.2 m[IU]/mL

## 2011-12-28 NOTE — Telephone Encounter (Signed)
gve the pt her June 2013 appt calendar along with the appt for the mammo/us along with the appt to see dr tafeen in April. Faxed over orders to [facility]

## 2011-12-28 NOTE — Progress Notes (Signed)
ID: [name]   DOB: 1955/12/12  MR#: [id number]  HYQ#:[id number]  HISTORY OF PRESENT ILLNESS: The patient had screening mammography in January 16, 2011 (she missed her mammogram on January 05, 2010) and this showed some changes in the left breast, which measured 1.1 cm.  Left breast ultrasound was obtained the same day and confirmed a small hypoechoic lobular area of nodularity measuring again 1.1 cm.  There was no other abnormality and the patient was set up for ultrasound-guided needle biopsy January 22, 2011.  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  Tumor was estrogen receptor positive at 82%, progesterone receptor positive at 92% with MIB-1 of 17% and no evidence of HER2 amplification, the ratio by CISH being 0.97.  With this information the patient was set up for bilateral breast MRI s and these were obtained January 29, 2011.  In the central left breast in the retroareolar location there was a spiculated enhancing mass with a regular margins measuring again 1.1 cm.  There were no other areas of abnormality.  She had her left lumpectomy and sentinel lymph node sampling on February 10, 2011.  The pathology report ([id number]) shows an 8-mm invasive ductal carcinoma, grade 1, involving 1/2 sentinel lymph nodes with a micrometastatic deposit.  The inferior margin was apparently broadly positive initially, but may have been cleared with subsequent excision in the same procedure.  The pathology report does not commit, but says the margin has been "likely cleared."   Her Oncotype DX gave her a recurrence score of 21%, predicting a risk of recurrent for node positive patients in the 12% range.  She decided to forego chemotherapy, but preceded with radiation therapy. She completed radiation in August 2012, and started on tamoxifen the first week of September 2012.  INTERVAL HISTORY: [name] returns today for routine three-month  followup of her left breast carcinoma. Interval history is generally unremarkable, [name] is feeling well today. She does have some hot flashes and clear vaginal discharge associated with tamoxifen. She has joint pain although she admits this is not new. She's had no peripheral swelling, no evidence of abnormal clotting. No abnormal bleeding. She's not had a menstrual cycle since mid August 2012, prior to tamoxifen.  [name] has some pain in sensitivity in the left breast around the lumpectomy site. She also noted a small "lump" on the lateral edge of her right breast a couple of weeks ago she would like for me to take a look at today. She's had some skin changes, and a chronic "rash" or skin lesion in the umbilical area. She tells me this is been treated with multiple topical creams, but has not resolved. She describes the area is feeling "raw" and painful.  A detailed review of systems is otherwise noncontributory as noted below.  Review of Systems: Constitutional:  Positive for hot flashes; no weight loss, fever, night sweats and feels well Eyes: negative [id number] Cardiovascular: no chest pain or dyspnea on exertion Respiratory: no cough, shortness of breath, or wheezing Neurological: no TIA or stroke symptoms negative Dermatological: positive for skin lesion changes Gastrointestinal: no abdominal pain, nausea, change in bowel habits, or black or bloody stools Genito-Urinary: negative Hematological and Lymphatic: negative Breast: positive for - new or changing breast lumps in right breast and pain in left breast Musculoskeletal: positive for - joint pain Remaining ROS negative.   PAST MEDICAL HISTORY: Past Medical History  Diagnosis Date  . HYPERLIPIDEMIA 05/11/2008  .  ANEMIA-IRON DEFICIENCY 05/11/2008  . GERD 05/11/2008  . OBSTRUCTIVE SLEEP APNEA 05/11/2008  . 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 RASH-NONVESICULAR 06/29/2008  . COLONIC POLYPS, HX OF 05/11/2008  . GENITAL HERPES, HX  OF 08/08/2009  . Inverted nipple     PAST SURGICAL HISTORY: Past Surgical History  Procedure Date  . Breast cyst excision 1973  . Cesarean section   . Right achilles tendon     and left  . S/p ear surgury   . Right ovarian cyst     hx  . S/p extra uterine fibroid 2006  . S/p left knee replacement 2007    FAMILY HISTORY Family History  Problem Relation Age of Onset  . Diabetes Mother   . Hypertension Mother   . Stroke Mother   . Heart disease Father     COPD  . Alcohol abuse Father     ETOH dependence    GYNECOLOGIC HISTORY:  SOCIAL HISTORY:    ADVANCED DIRECTIVES:  HEALTH MAINTENANCE: History  Substance Use Topics  . Smoking status: Never Smoker   . Smokeless tobacco: Not on file  . Alcohol Use: Yes     Colonoscopy:  PAP:  Bone density:  Lipid panel:  Allergies  Allergen Reactions  . Codeine   . Cymbalta (Duloxetine Hcl) Other (See Comments)    Head felt funny, ? thinking not right  . Darvon   . Oxycodone   . Propoxyphene Hcl     Current Outpatient Prescriptions  Medication Sig Dispense Refill  . cholecalciferol (VITAMIN D) 400 UNITS TABS Take 1,000 Units by mouth daily.      . Ferrous Sulfate (IRON) 325 (65 FE) MG TABS Take by mouth daily.        . fish oil-omega-3 fatty acids 1000 MG capsule Take 1 g by mouth daily.      . tamoxifen (NOLVADEX) 20 MG tablet Take 1 tablet (20 mg total) by mouth daily.  90 tablet  1  . telmisartan-hydrochlorothiazide (MICARDIS HCT) 80-25 MG per tablet Take 1 tablet by mouth daily.  90 tablet  3  . vitamin C (ASCORBIC ACID) 500 MG tablet Take 500 mg by mouth daily.      . valACYclovir (VALTREX) 1000 MG tablet Take 1,000 mg by mouth daily.          OBJECTIVE: Filed Vitals:   12/28/11 1445  BP: 126/80  Pulse: 66  Temp: 98.3 F (36.8 C)     Body mass index is 44.92 kg/(m^2).    ECOG FS: 0  Physical Exam: HEENT:  Sclerae anicteric, conjunctivae pink.  Oropharynx clear.  No mucositis or candidiasis.   Nodes:   No cervical, supraclavicular, or axillary lymphadenopathy palpated.  Breast Exam: Right breast, there is some palpable nodularity at the 9:00 position lateral edge of the right breast. No skin changes or nipple inversion. Left breast, status post lumpectomy with well-healed incision. No suspicious nodularity, skin changes, or evidence of local recurrence.  Lungs:  Clear to auscultation bilaterally.  No crackles, rhonchi, or wheezes.   Heart:  Regular rate and rhythm.   Abdomen:  Soft, obese, nontender.  Positive bowel sounds.  No organomegaly or masses palpated.   Musculoskeletal:  No focal spinal tenderness to palpation.  Extremities:  Benign.  No peripheral edema or cyanosis.   Skin: Well-circumscribed macular area of hyperpigmentation surrounding the umbilicus with some slight skin breakdown to the right of the umbilicus. No pustules or vesicles noted.  Neuro:  Nonfocal. Alert and oriented x3  LAB RESULTS: Lab Results  Component Value Date   WBC 4.5 12/28/2011   NEUTROABS 2.7 12/28/2011   HGB 11.6 12/28/2011   HCT 35.3 12/28/2011   MCV 86.3 12/28/2011   PLT 240 12/28/2011      Chemistry      Component Value Date/Time   NA 139 11/25/2011 1714   K 3.5 11/25/2011 1714   CL 106 11/25/2011 1714   CO2 26 11/25/2011 1714   BUN 12 11/25/2011 1714   CREATININE 0.7 11/25/2011 1714      Component Value Date/Time   CALCIUM 9.1 11/25/2011 1714   ALKPHOS 40 05/11/2011 1335   ALKPHOS 40 05/11/2011 1335   ALKPHOS 40 05/11/2011 1335   AST 17 05/11/2011 1335   AST 17 05/11/2011 1335   AST 17 05/11/2011 1335   ALT 13 05/11/2011 1335   ALT 13 05/11/2011 1335   ALT 13 05/11/2011 1335   BILITOT 0.2* 05/11/2011 1335   BILITOT 0.2* 05/11/2011 1335   BILITOT 0.2* 05/11/2011 1335       Lab Results  Component Value Date   LABCA2 20 05/11/2011   LABCA2 20 05/11/2011     STUDIES: No results found.  ASSESSMENT: 56 year old [place] woman   (1)  status post left lumpectomy and sentinel lymph node dissection  April of 2012 for a T1b N1(mic) stage II invasive ductal carcinoma, grade 1, estrogen receptor 82% and progesterone receptor 92% positive, with no HER-2 amplification, and an MIB-1-1 of 17%,   (2)  The patient's Oncotype DX score of 21 predicts a 13% risk of distant recurrence after 5 years of tamoxifen.   (3)  status post radiation completed August of 2012,   (4)  on tamoxifen since September of 2012 with good tolerance.   PLAN: Kyaira will continue on her tamoxifen at 20 mg daily which she is tolerating well. The plan is to continue tamoxifen for 2-3 years before possibly switching to an aromatase inhibitor.  Meeka will be due for her annual bilateral mammogram on April 1. We would like to go ahead, however, and check the right breast with a diagnostic mammogram and ultrasound if necessary. We will see if [facility] can go ahead and do her bilateral mammogram now rather than waiting until April 1.  I'm referring her to Dr. Janalyn Harder for dermatology consult regarding the chronic lesion surrounding the umbilicus.  We will see Randal back in 3 months for routine labs and followup visit. She voices understanding and agreement with our plan, and call with any changes or problems.  Daney Moor    12/28/2011

## 2011-12-30 ENCOUNTER — Other Ambulatory Visit: Payer: Self-pay

## 2011-12-31 LAB — ESTRADIOL, ULTRA SENS: Estradiol, Ultra Sensitive: 41 pg/mL

## 2012-01-04 ENCOUNTER — Encounter: Payer: Self-pay | Admitting: Internal Medicine

## 2012-01-06 ENCOUNTER — Other Ambulatory Visit: Payer: Self-pay | Admitting: Oncology

## 2012-01-07 ENCOUNTER — Ambulatory Visit
Admission: RE | Admit: 2012-01-07 | Discharge: 2012-01-07 | Disposition: A | Discharge status: Home / Self Care | Payer: 59 | Source: Ambulatory Visit | Attending: Radiation Oncology | Admitting: Radiation Oncology

## 2012-01-07 ENCOUNTER — Encounter: Payer: Self-pay | Admitting: Radiation Oncology

## 2012-01-07 VITALS — HR 78 | Temp 98.4°F | Resp 18 | Wt 235.0 lb

## 2012-01-07 DIAGNOSIS — C50912 Malignant neoplasm of unspecified site of left female breast: Secondary | ICD-10-CM

## 2012-01-07 NOTE — Progress Notes (Signed)
   Department of Radiation Oncology  Phone:  [phone] Fax:        [phone]   Name: [name]   DOB: 11/23/1955  MRN: [id number]    Date: 01/07/2012  Follow Up Visit Note  CC: Oliver Barre, MD  Diagnosis: T1 CN0 invasive ductal carcinoma the left breast ER positive  Interval since last radiation: 7 months  Allergies:  Allergies  Allergen Reactions  . Codeine   . Cymbalta (Duloxetine Hcl) Other (See Comments)    Head felt funny, ? thinking not right  . Darvon   . Oxycodone   . Propoxyphene Hcl     Medications:  Current Outpatient Prescriptions  Medication Sig Dispense Refill  . cholecalciferol (VITAMIN D) 400 UNITS TABS Take 1,000 Units by mouth daily.      . Ferrous Sulfate (IRON) 325 (65 FE) MG TABS Take by mouth daily.        . fish oil-omega-3 fatty acids 1000 MG capsule Take 1 g by mouth daily.      . tamoxifen (NOLVADEX) 20 MG tablet Take 1 tablet (20 mg total) by mouth daily.  90 tablet  1  . telmisartan-hydrochlorothiazide (MICARDIS HCT) 80-25 MG per tablet Take 1 tablet by mouth daily.  90 tablet  3  . valACYclovir (VALTREX) 1000 MG tablet Take 1,000 mg by mouth daily.        . vitamin C (ASCORBIC ACID) 500 MG tablet Take 500 mg by mouth daily.        Interval History: [name] presents today for routine followup.  She is doing well. She is tolerating her tamoxifen with few side effects. She recently was seen by Dr. Darnelle Catalan. At that time she complained of a palpable right breast mass. Imaging and biopsy was performed which confirmed an intramammary lymph node. She complains of her left breast feeling more firm it and swollen than her right. She has occasional pain in the left breast. He feels this is stable or improving.  Physical Exam:   weight is 235 lb (106.595 kg). Her oral temperature is 98.4 F (36.9 C). Her pulse is 78. Her respiration is 18.  Her left breast is slightly darker than her right. This is particularly noticeable laterally. She  has periareolar edema. No palpable axillary lymph nodes bilaterally.  IMPRESSION: Stage I left breast cancer status post breast conservation with minimal residual side effects  PLAN:  [name] is a 56 y.o. female who has done well thus far. She is scheduled for mammograms in one year. She is regular scheduled followup with Dr. Darnelle Catalan. She would like to continue followup here. I scheduled her for followup in 6 months.    Lurline Hare, MD

## 2012-01-07 NOTE — Progress Notes (Signed)
HERE FOR FU OF LEFT BREAST TX.  HAD BX OF RIGHT BREAST 12/30/2011, RESULTS WERE NEG FOR CA.   HAD BILATERAL MAMMOGRAM.  HAVING PAIN IN LEGS WITH THEM BEING STIFF.  TOLD BY DR. MAGRINAT'S PA THAT THE PAIN IS COMING FROM THE TAMOXIFEN, SHE HAS BEEN TAKING FOR 6 MONTHS  SKIN LOOKS GOOD STILL A LITTLE DARK IN COLOR

## 2012-01-08 ENCOUNTER — Encounter: Payer: Self-pay | Admitting: Internal Medicine

## 2012-01-13 ENCOUNTER — Encounter: Payer: Self-pay | Admitting: Obstetrics and Gynecology

## 2012-01-19 ENCOUNTER — Ambulatory Visit (INDEPENDENT_AMBULATORY_CARE_PROVIDER_SITE_OTHER): Payer: 59 | Admitting: Obstetrics and Gynecology

## 2012-01-19 DIAGNOSIS — Z202 Contact with and (suspected) exposure to infections with a predominantly sexual mode of transmission: Secondary | ICD-10-CM

## 2012-01-19 DIAGNOSIS — Z01419 Encounter for gynecological examination (general) (routine) without abnormal findings: Secondary | ICD-10-CM

## 2012-01-19 DIAGNOSIS — R319 Hematuria, unspecified: Secondary | ICD-10-CM

## 2012-01-20 ENCOUNTER — Encounter: Payer: Self-pay | Admitting: Gastroenterology

## 2012-01-21 ENCOUNTER — Ambulatory Visit: Payer: Self-pay | Admitting: Obstetrics and Gynecology

## 2012-02-08 ENCOUNTER — Other Ambulatory Visit: Payer: Self-pay

## 2012-02-08 DIAGNOSIS — R3915 Urgency of urination: Secondary | ICD-10-CM

## 2012-02-10 ENCOUNTER — Telehealth: Payer: Self-pay

## 2012-02-10 NOTE — Telephone Encounter (Signed)
Pc to pt per urology referral. Appt sched 03/07/12 @3 :00p with Dr.Nesi. Pt agrees.

## 2012-02-17 ENCOUNTER — Other Ambulatory Visit (INDEPENDENT_AMBULATORY_CARE_PROVIDER_SITE_OTHER): Payer: 59

## 2012-02-17 ENCOUNTER — Encounter: Payer: Self-pay | Admitting: Internal Medicine

## 2012-02-17 ENCOUNTER — Ambulatory Visit (INDEPENDENT_AMBULATORY_CARE_PROVIDER_SITE_OTHER): Payer: 59 | Admitting: Internal Medicine

## 2012-02-17 VITALS — BP 106/62 | HR 73 | Temp 97.7°F | Ht 60.0 in | Wt 234.4 lb

## 2012-02-17 DIAGNOSIS — M25559 Pain in unspecified hip: Secondary | ICD-10-CM

## 2012-02-17 DIAGNOSIS — M25552 Pain in left hip: Secondary | ICD-10-CM | POA: Insufficient documentation

## 2012-02-17 DIAGNOSIS — M25569 Pain in unspecified knee: Secondary | ICD-10-CM

## 2012-02-17 DIAGNOSIS — Z860100 Personal history of colon polyps, unspecified: Secondary | ICD-10-CM

## 2012-02-17 DIAGNOSIS — R7309 Other abnormal glucose: Secondary | ICD-10-CM

## 2012-02-17 DIAGNOSIS — M549 Dorsalgia, unspecified: Secondary | ICD-10-CM

## 2012-02-17 DIAGNOSIS — E661 Drug-induced obesity: Secondary | ICD-10-CM | POA: Insufficient documentation

## 2012-02-17 DIAGNOSIS — Z0000 Encounter for general adult medical examination without abnormal findings: Secondary | ICD-10-CM

## 2012-02-17 DIAGNOSIS — R7302 Impaired glucose tolerance (oral): Secondary | ICD-10-CM

## 2012-02-17 DIAGNOSIS — M25562 Pain in left knee: Secondary | ICD-10-CM

## 2012-02-17 LAB — CBC WITH DIFFERENTIAL/PLATELET
Basophils Absolute: 0 10*3/uL (ref 0.0–0.1)
Basophils Relative: 0.6 % (ref 0.0–3.0)
Eosinophils Absolute: 0.1 10*3/uL (ref 0.0–0.7)
Eosinophils Relative: 2 % (ref 0.0–5.0)
HCT: 33 % — ABNORMAL LOW (ref 36.0–46.0)
Hemoglobin: 10.9 g/dL — ABNORMAL LOW (ref 12.0–15.0)
Lymphocytes Relative: 24.4 % (ref 12.0–46.0)
Lymphs Abs: 1.3 10*3/uL (ref 0.7–4.0)
MCHC: 33 g/dL (ref 30.0–36.0)
MCV: 87.9 fl (ref 78.0–100.0)
Monocytes Absolute: 0.5 10*3/uL (ref 0.1–1.0)
Monocytes Relative: 10.4 % (ref 3.0–12.0)
Neutro Abs: 3.2 10*3/uL (ref 1.4–7.7)
Neutrophils Relative %: 62.6 % (ref 43.0–77.0)
Platelets: 227 10*3/uL (ref 150.0–400.0)
RBC: 3.76 Mil/uL — ABNORMAL LOW (ref 3.87–5.11)
RDW: 14.7 % — ABNORMAL HIGH (ref 11.5–14.6)
WBC: 5.1 10*3/uL (ref 4.5–10.5)

## 2012-02-17 LAB — HEPATIC FUNCTION PANEL
ALT: 14 U/L (ref 0–35)
AST: 19 U/L (ref 0–37)
Albumin: 3.6 g/dL (ref 3.5–5.2)
Alkaline Phosphatase: 36 U/L — ABNORMAL LOW (ref 39–117)
Bilirubin, Direct: 0 mg/dL (ref 0.0–0.3)
Total Bilirubin: 0.2 mg/dL — ABNORMAL LOW (ref 0.3–1.2)
Total Protein: 7.5 g/dL (ref 6.0–8.3)

## 2012-02-17 LAB — URINALYSIS, ROUTINE W REFLEX MICROSCOPIC
Bilirubin Urine: NEGATIVE
Ketones, ur: NEGATIVE
Leukocytes, UA: NEGATIVE
Nitrite: NEGATIVE
Specific Gravity, Urine: >=1.03 (ref 1.000–1.030)
Total Protein, Urine: NEGATIVE
Urine Glucose: NEGATIVE
Urobilinogen, UA: 0.2 (ref 0.0–1.0)
pH: 6 (ref 5.0–8.0)

## 2012-02-17 LAB — BASIC METABOLIC PANEL
BUN: 15 mg/dL (ref 6–23)
CO2: 29 mEq/L (ref 19–32)
Calcium: 8.8 mg/dL (ref 8.4–10.5)
Chloride: 105 mEq/L (ref 96–112)
Creatinine, Ser: 0.8 mg/dL (ref 0.4–1.2)
GFR: 91.07 mL/min (ref >60.00)
Glucose, Bld: 106 mg/dL — ABNORMAL HIGH (ref 70–99)
Potassium: 3.7 mEq/L (ref 3.5–5.1)
Sodium: 143 mEq/L (ref 135–145)

## 2012-02-17 LAB — HEMOGLOBIN A1C: Hgb A1c MFr Bld: 5.9 % (ref 4.6–6.5)

## 2012-02-17 LAB — LIPID PANEL
Cholesterol: 164 mg/dL (ref 0–200)
HDL: 71.3 mg/dL (ref >39.00)
Total CHOL/HDL Ratio: 2
Triglycerides: 223 mg/dL — ABNORMAL HIGH (ref 0.0–149.0)
VLDL: 44.6 mg/dL — ABNORMAL HIGH (ref 0.0–40.0)

## 2012-02-17 LAB — TSH: TSH: 1.24 u[IU]/mL (ref 0.35–5.50)

## 2012-02-17 MED ORDER — TELMISARTAN-HCTZ 80-25 MG PO TABS: 1.0000 | ORAL_TABLET | Freq: Every day | ORAL | Status: DC

## 2012-02-17 MED ORDER — VALACYCLOVIR HCL 1 G PO TABS: 1000.0000 mg | ORAL_TABLET | Freq: Every day | ORAL | Status: DC

## 2012-02-17 NOTE — Progress Notes (Signed)
Subjective:    Patient ID: [name], female    DOB: 1956-01-29, 56 y.o.   MRN: [id number]  HPI  Here for wellness and f/u;  Overall doing ok;  Pt denies CP, worsening SOB, DOE, wheezing, orthopnea, PND, worsening LE edema, palpitations, dizziness or syncope.  Pt denies neurological change such as new Headache, facial or extremity weakness.  Pt denies polydipsia, polyuria, or low sugar symptoms. Pt states overall good compliance with treatment and medications, good tolerability, and trying to follow lower cholesterol diet.  Pt denies worsening depressive symptoms, suicidal ideation or panic. No fever, wt loss, night sweats, loss of appetite, or other constitutional symptoms.  Pt states good ability with ADL's, low fall risk, home safety reviewed and adequate, no significant changes in hearing or vision, and occasionally active with exercise.  To see GI/Dr Arlyce Dice soon for hematochezi and GI symtpoms, also to see urology for hematuria.  Is interested as well in bariatric evaluation for possible Lap Band  Does also have recurring left lower back pain with radiation to left buttoc, and possibly left groin area, though not sure if does not have possible hip DJD as well.  Is s/p left knee TKR with some increased pain recent, still owes $100 to [facility] but plans to remit payment in order to be referred for these issues as well.   Due for labs, and overdue for colonoscopy - last 2007, was to f/u 2012 due to polyps. Past Medical History  Diagnosis Date  . HYPERLIPIDEMIA 05/11/2008  . OBSTRUCTIVE SLEEP APNEA 05/11/2008  . RASH-NONVESICULAR 06/29/2008  . COLONIC POLYPS, HX OF 05/11/2008  . GENITAL HERPES, HX OF 08/08/2009  . Inverted nipple   . Menorrhagia   . 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 LLQ pain   . Hernia   . Low iron   . H/O gonorrhea   . Bacterial infection   . Trichomonas   . H/O irritable bowel syndrome    Past Surgical History  Procedure Date  . Cesarean section   . Right  achilles tendon     and left  . S/p ear surgury   . Right ovarian cyst     hx  . S/p extra uterine fibroid 2006  . S/p left knee replacement 2007  . Dilation and curettage of uterus   . Breast cyst excision 1973  . Uterine fibroid surgery 2006    reports that she has never smoked. She does not have any smokeless tobacco history on file. She reports that she drinks alcohol. She reports that she does not use illicit drugs. family history includes Alcohol abuse in her father; Diabetes in her mother; Heart disease in her father; Hypertension in her mother; and Stroke in her mother. Allergies  Allergen Reactions  . Codeine   . Cymbalta (Duloxetine Hcl) Other (See Comments)    Head felt funny, ? thinking not right  . Darvon   . Oxycodone   . Propoxyphene Hcl    Current Outpatient Prescriptions on File Prior to Visit  Medication Sig Dispense Refill  . cholecalciferol (VITAMIN D) 400 UNITS TABS Take 1,000 Units by mouth daily.      . Ferrous Sulfate (IRON) 325 (65 FE) MG TABS Take by mouth daily.        . fish oil-omega-3 fatty acids 1000 MG capsule Take 1 g by mouth daily.      . tamoxifen (NOLVADEX) 20 MG tablet Take 1 tablet (20 mg total) by mouth daily.  90 tablet  1  . vitamin C (ASCORBIC ACID) 500 MG tablet Take 500 mg by mouth daily.      [name] DISCONTD: telmisartan-hydrochlorothiazide (MICARDIS HCT) 80-25 MG per tablet Take 1 tablet by mouth daily.  90 tablet  3  . DISCONTD: tolterodine (DETROL LA) 4 MG 24 hr capsule Take 4 mg by mouth daily.       Review of Systems Review of Systems  Constitutional: Negative for diaphoresis, activity change, appetite change and unexpected weight change.  HENT: Negative for hearing loss, ear pain, facial swelling, mouth sores and neck stiffness.   Eyes: Negative for pain, redness and visual disturbance.  Respiratory: Negative for shortness of breath and wheezing.   Cardiovascular: Negative for chest pain and palpitations.  Gastrointestinal:  Negative for diarrhea, blood in stool, abdominal distention and rectal pain.  Genitourinary: Negative for hematuria, flank pain and decreased urine volume.  Musculoskeletal: Negative for myalgias and joint swelling.  Skin: Negative for color change and wound.  Neurological: Negative for syncope and numbness.  Hematological: Negative for adenopathy.  Psychiatric/Behavioral: Negative for hallucinations, self-injury, decreased concentration and agitation.      Objective:   Physical Exam BP 106/62  Pulse 73  Temp(Src) 97.7 F (36.5 C) (Oral)  Ht 5' (1.524 m)  Wt 234 lb 6 oz (106.312 kg)  BMI 45.77 kg/m2  SpO2 99% Physical Exam  VS noted Constitutional: Pt is oriented to person, place, and time. Appears well-developed and well-nourished.  HENT:  Head: Normocephalic and atraumatic.  Right Ear: External ear normal.  Left Ear: External ear normal.  Nose: Nose normal.  Mouth/Throat: Oropharynx is clear and moist.  Eyes: Conjunctivae and EOM are normal. Pupils are equal, round, and reactive to light.  Neck: Normal range of motion. Neck supple. No JVD present. No tracheal deviation present.  Cardiovascular: Normal rate, regular rhythm, normal heart sounds and intact distal pulses.   Pulmonary/Chest: Effort normal and breath sounds normal.  Abdominal: Soft. Bowel sounds are normal. There is no tenderness.  Musculoskeletal: Normal range of motion. Exhibits no edema.  Lymphadenopathy:  Has no cervical adenopathy.  Neurological: Pt is alert and oriented to person, place, and time. Pt has normal reflexes. No cranial nerve deficit.  Skin: Skin is warm and dry. No rash noted.  Spine nontender, mild tender over left pyriformis area, some pain with left hip flexion, left knee with somewhat decreased ROM, no effusion Psychiatric:  Has mild dysphoric affect, 1+ nervous. Behavior is normal.     Assessment & Plan:

## 2012-02-17 NOTE — Assessment & Plan Note (Signed)

## 2012-02-17 NOTE — Assessment & Plan Note (Signed)
Left lower back , worsening severity with some ? Radiation to left groin  - for ortho eval

## 2012-02-17 NOTE — Assessment & Plan Note (Signed)
stable overall by hx and exam, most recent data reviewed with pt, and pt to continue medical treatment as before Lab Results  Component Value Date   HGBA1C 6.4 08/18/2011    

## 2012-02-17 NOTE — Patient Instructions (Signed)
Continue all other medications as before Your refills were sent to [facility] today Please go to LAB in the Basement for the blood and/or urine tests to be done today You will be contacted by phone if any changes need to be made immediately.  Otherwise, you will receive a letter about your results with an explanation. You will be contacted regarding the referral for: orthopedic ([facility]), General Surgury (bariatric), and colonoscopy Please return in 1 year for your yearly visit, or sooner if needed, with Lab testing done 3-5 days before

## 2012-02-17 NOTE — Assessment & Plan Note (Signed)
To refer bariatric surgury

## 2012-02-17 NOTE — Assessment & Plan Note (Signed)
Overdue for colonoscopy - for f/u

## 2012-02-17 NOTE — Assessment & Plan Note (Signed)
?   Etiology- hip joint vs referred back from back, for ortho eval

## 2012-02-18 ENCOUNTER — Encounter: Payer: Self-pay | Admitting: Internal Medicine

## 2012-02-18 LAB — LDL CHOLESTEROL, DIRECT: Direct LDL: 76.5 mg/dL

## 2012-02-22 ENCOUNTER — Ambulatory Visit (INDEPENDENT_AMBULATORY_CARE_PROVIDER_SITE_OTHER): Payer: 59 | Admitting: Gastroenterology

## 2012-02-22 ENCOUNTER — Encounter: Payer: Self-pay | Admitting: Gastroenterology

## 2012-02-22 VITALS — BP 110/64 | HR 76 | Ht 60.0 in | Wt 232.0 lb

## 2012-02-22 DIAGNOSIS — R197 Diarrhea, unspecified: Secondary | ICD-10-CM

## 2012-02-22 DIAGNOSIS — Z860100 Personal history of colon polyps, unspecified: Secondary | ICD-10-CM

## 2012-02-22 DIAGNOSIS — R1012 Left upper quadrant pain: Secondary | ICD-10-CM

## 2012-02-22 MED ORDER — HYOSCYAMINE SULFATE 0.125 MG SL SUBL: 0.2500 mg | SUBLINGUAL_TABLET | SUBLINGUAL | Status: DC | PRN

## 2012-02-22 MED ORDER — PEG-KCL-NACL-NASULF-NA ASC-C 100 G PO SOLR: 1.0000 | Freq: Once | ORAL | Status: DC

## 2012-02-22 NOTE — Assessment & Plan Note (Signed)
Symptoms are suggestive of IBS. Microscopic colitis must also be considered. Medications have been stable and are less likely to be a cause for her complaints.  Recommendations #1 colonoscopy

## 2012-02-22 NOTE — Assessment & Plan Note (Signed)
Plan followup colonoscopy 

## 2012-02-22 NOTE — Assessment & Plan Note (Signed)
She complains of nonspecific intermittent left upper quadrant pain.  Recommendations #1 trial of hyomax sublingual

## 2012-02-22 NOTE — Patient Instructions (Signed)
Colonoscopy A colonoscopy is an exam to evaluate your entire colon. In this exam, your colon is cleansed. A long fiberoptic tube is inserted through your rectum and into your colon. The fiberoptic scope (endoscope) is a long bundle of enclosed and very flexible fibers. These fibers transmit light to the area examined and send images from that area to your caregiver. Discomfort is usually minimal. You may be given a drug to help you sleep (sedative) during or prior to the procedure. This exam helps to detect lumps (tumors), polyps, inflammation, and areas of bleeding. Your caregiver may also take a small piece of tissue (biopsy) that will be examined under a microscope. LET YOUR CAREGIVER KNOW ABOUT:   Allergies to food or medicine.   Medicines taken, including vitamins, herbs, eyedrops, over-the-counter medicines, and creams.   Use of steroids (by mouth or creams).   Previous problems with anesthetics or numbing medicines.   History of bleeding problems or blood clots.   Previous surgery.   Other health problems, including diabetes and kidney problems.   Possibility of pregnancy, if this applies.  BEFORE THE PROCEDURE   A clear liquid diet may be required for 2 days before the exam.   Ask your caregiver about changing or stopping your regular medications.   Liquid injections (enemas) or laxatives may be required.   A large amount of electrolyte solution may be given to you to drink over a short period of time. This solution is used to clean out your colon.   You should be present 60 minutes prior to your procedure or as directed by your caregiver.  AFTER THE PROCEDURE   If you received a sedative or pain relieving medication, you will need to arrange for someone to drive you home.   Occasionally, there is a little blood passed with the first bowel movement. Do not be concerned.  FINDING OUT THE RESULTS OF YOUR TEST Not all test results are available during your visit. If your test  results are not back during the visit, make an appointment with your caregiver to find out the results. Do not assume everything is normal if you have not heard from your caregiver or the medical facility. It is important for you to follow up on all of your test results. HOME CARE INSTRUCTIONS   It is not unusual to pass moderate amounts of gas and experience mild abdominal cramping following the procedure. This is due to air being used to inflate your colon during the exam. Walking or a warm pack on your belly (abdomen) may help.   You may resume all normal meals and activities after sedatives and medicines have worn off.   Only take over-the-counter or prescription medicines for pain, discomfort, or fever as directed by your caregiver. Do not use aspirin or blood thinners if a biopsy was taken. Consult your caregiver for medicine usage if biopsies were taken.  SEEK IMMEDIATE MEDICAL CARE IF:   You have a fever.   You pass large blood clots or fill a toilet with blood following the procedure. This may also occur 10 to 14 days following the procedure. This is more likely if a biopsy was taken.   You develop abdominal pain that keeps getting worse and cannot be relieved with medicine.  Document Released: 10/02/2000 Document Revised: 09/24/2011 Document Reviewed: 05/17/2008 [employer] Patient Information 2012 [employer]. 

## 2012-02-22 NOTE — Progress Notes (Signed)
History of Present Illness:  [name] is a 56 year old Afro-American female referred at the request of Dr. Jonny Ruiz for evaluation of diarrhea. For several years she's had loose stools consisting of poorly formed and watery stools 2-3 times a day. This often occurs when she urinates. She does not awaken and move her bowels. She occasionally has urgency. She denies melena or hematochezia. She complains of excess gas and bloating and a constant feeling of having to move her bowels. Symptoms may worsen slightly with dairy products. Colonoscopy in 2007 was normal. She has a history of colon polyps. Endoscopy for abdominal pain in 2007 was normal. Shows complains of intermittent but severe left upper quadrant pain that may last for up to an hour. It is not related to eating.    Past Medical History  Diagnosis Date  . HYPERLIPIDEMIA 05/11/2008  . OBSTRUCTIVE SLEEP APNEA 05/11/2008  . RASH-NONVESICULAR 06/29/2008  . COLONIC POLYPS, HX OF 05/11/2008  . GENITAL HERPES, HX OF 08/08/2009  . Inverted nipple   . Menorrhagia   . 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 LLQ pain   . Hernia   . Low iron   . H/O gonorrhea   . Bacterial infection   . Trichomonas   . H/O irritable bowel syndrome   . Breast cancer    Past Surgical History  Procedure Date  . Cesarean section   . Right achilles tendon     and left  . S/p ear surgury   . Right ovarian cyst     hx  . S/p extra uterine fibroid 2006  . S/p left knee replacement 2007  . Dilation and curettage of uterus   . Breast cyst excision 1973  . Uterine fibroid surgery 2006   family history includes Alcohol abuse in her father; Diabetes in her mother; Heart disease in her father; Hypertension in her mother; and Stroke in her mother.  There is no history of Colon cancer. Current Outpatient Prescriptions  Medication Sig Dispense Refill  . cholecalciferol (VITAMIN D) 400 UNITS TABS Take 1,000 Units by mouth daily.      . Ferrous Sulfate (IRON) 325 (65  FE) MG TABS Take by mouth daily.        . fish oil-omega-3 fatty acids 1000 MG capsule Take 1 g by mouth daily.      . tamoxifen (NOLVADEX) 20 MG tablet Take 1 tablet (20 mg total) by mouth daily.  90 tablet  1  . telmisartan-hydrochlorothiazide (MICARDIS HCT) 80-25 MG per tablet Take 1 tablet by mouth daily.  90 tablet  3  . valACYclovir (VALTREX) 1000 MG tablet Take 1 tablet (1,000 mg total) by mouth daily.  90 tablet  3  . vitamin C (ASCORBIC ACID) 500 MG tablet Take 500 mg by mouth daily.      [name] DISCONTD: tolterodine (DETROL LA) 4 MG 24 hr capsule Take 4 mg by mouth daily.       Allergies as of 02/22/2012 - Review Complete 02/22/2012  Allergen Reaction Noted  . Codeine  05/11/2008  . Cymbalta (duloxetine hcl) Other (See Comments) 08/18/2011  . Darvon  12/29/2004  . Oxycodone  11/27/2008  . Propoxyphene hcl  05/11/2008    reports that she has never smoked. She has never used smokeless tobacco. She reports that she does not drink alcohol or use illicit drugs.     Review of Systems: She's had a recurrent rash along the midline of the buttocks in the coccyx area   Pertinent positive and  negative review of systems were noted in the above HPI section. All other review of systems were otherwise negative.  Vital signs were reviewed in today's medical record Physical Exam: General: Well developed , well nourished, no acute distress Head: Normocephalic and atraumatic Eyes:  sclerae anicteric, EOMI Ears: Normal auditory acuity Mouth: No deformity or lesions Neck: Supple, no masses or thyromegaly Lungs: Clear throughout to auscultation Heart: Regular rate and rhythm; no murmurs, rubs or bruits Abdomen: Soft, non tender and non distended. No masses, hepatosplenomegaly or hernias noted. Normal Bowel sounds Rectal:deferred Musculoskeletal: Symmetrical with no gross deformities  Skin: There is a linear excoriated rash in the coccyx and along her vertebrae Pulses:  Normal pulses  noted Extremities: No clubbing, cyanosis, edema or deformities noted Neurological: Alert oriented x 4, grossly nonfocal Cervical Nodes:  No significant cervical adenopathy Inguinal Nodes: No significant inguinal adenopathy Psychological:  Alert and cooperative. Normal mood and affect

## 2012-02-24 ENCOUNTER — Ambulatory Visit (AMBULATORY_SURGERY_CENTER): Payer: 59 | Admitting: Gastroenterology

## 2012-02-24 ENCOUNTER — Encounter: Payer: Self-pay | Admitting: Gastroenterology

## 2012-02-24 VITALS — BP 161/76 | HR 61 | Temp 99.2°F | Resp 18 | Ht 60.0 in | Wt 232.0 lb

## 2012-02-24 DIAGNOSIS — R197 Diarrhea, unspecified: Secondary | ICD-10-CM

## 2012-02-24 DIAGNOSIS — K5730 Diverticulosis of large intestine without perforation or abscess without bleeding: Secondary | ICD-10-CM

## 2012-02-24 DIAGNOSIS — R1012 Left upper quadrant pain: Secondary | ICD-10-CM

## 2012-02-24 DIAGNOSIS — D126 Benign neoplasm of colon, unspecified: Secondary | ICD-10-CM

## 2012-02-24 DIAGNOSIS — Z86010 Personal history of colonic polyps: Secondary | ICD-10-CM

## 2012-02-24 HISTORY — PX: COLONOSCOPY: SHX174

## 2012-02-24 MED ORDER — SODIUM CHLORIDE 0.9 % IV SOLN: 500.0000 mL | INTRAVENOUS | Status: DC

## 2012-02-24 NOTE — Op Note (Signed)
[facility] [address] [place], [place]  [address]  COLONOSCOPY PROCEDURE REPORT  PATIENT:  [name], [name]  MR#:  [id number] BIRTHDATE:  1956-10-11, 55 yrs. old  GENDER:  female ENDOSCOPIST:  Barbette Hair. Arlyce Dice, MD REF. BY:  Oliver Barre, M.D. PROCEDURE DATE:  02/24/2012 PROCEDURE:  Colonoscopy with biopsy ASA CLASS:  Class II INDICATIONS:  unexplained diarrhea MEDICATIONS:   MAC sedation, administered by CRNA propofol 220mg IV  DESCRIPTION OF PROCEDURE:   After the risks benefits and alternatives of the procedure were thoroughly explained, informed consent was obtained.  Digital rectal exam was performed and revealed no abnormalities.   The LB [id number] endoscope was introduced through the anus and advanced to the cecum, which was identified by both the appendix and ileocecal valve, without limitations.  The quality of the prep was excellent, using MoviPrep.  The instrument was then slowly withdrawn as the colon was fully examined. <<PROCEDUREIMAGES>>  FINDINGS:  A diverticulum was found in the descending colon (see image1).  This was otherwise a normal examination of the colon (see image2 and image3). Random biopsies were taken every 10cm to r/o microscopic colitis   Retroflexed views in the rectum revealed no abnormalities.    The time to cecum =  1) 1.75  minutes. The scope was then withdrawn in  1) 6.0  minutes from the cecum and the procedure completed. 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 Arlyce Dice, MD  CC:  n. eSIGNED:   Barbette Hair. Laine Fonner at 02/24/2012 02:58 PM  [place], [phone]

## 2012-02-24 NOTE — Patient Instructions (Signed)

## 2012-02-24 NOTE — Progress Notes (Signed)
Patient did not experience any of the following events: a burn prior to discharge; a fall within the facility; wrong site/side/patient/procedure/implant event; or a hospital transfer or hospital admission upon discharge from the facility. (G8907) Patient did not have preoperative order for IV antibiotic SSI prophylaxis. (G8918)  

## 2012-02-25 ENCOUNTER — Encounter: Payer: Self-pay | Admitting: Gastroenterology

## 2012-02-25 ENCOUNTER — Telehealth: Payer: Self-pay | Admitting: *Deleted

## 2012-02-25 NOTE — Telephone Encounter (Signed)
No answer. Left message to call if questions or concerns. 

## 2012-03-02 ENCOUNTER — Encounter: Payer: Self-pay | Admitting: Gastroenterology

## 2012-03-16 ENCOUNTER — Other Ambulatory Visit (HOSPITAL_COMMUNITY): Payer: Self-pay | Admitting: Specialist

## 2012-03-16 DIAGNOSIS — M25562 Pain in left knee: Secondary | ICD-10-CM

## 2012-03-23 ENCOUNTER — Encounter (HOSPITAL_COMMUNITY)
Admission: RE | Admit: 2012-03-23 | Discharge: 2012-03-23 | Disposition: A | Discharge status: Home / Self Care | Payer: 59 | Source: Ambulatory Visit | Attending: Specialist | Admitting: Specialist

## 2012-03-23 DIAGNOSIS — M25562 Pain in left knee: Secondary | ICD-10-CM

## 2012-03-23 DIAGNOSIS — M25569 Pain in unspecified knee: Secondary | ICD-10-CM | POA: Insufficient documentation

## 2012-03-23 DIAGNOSIS — Z96659 Presence of unspecified artificial knee joint: Secondary | ICD-10-CM | POA: Insufficient documentation

## 2012-03-23 MED ORDER — TECHNETIUM TC 99M MEDRONATE IV KIT
25.0000 | PACK | Freq: Once | INTRAVENOUS | Status: AC | PRN
  Administered 2012-03-23: 25 via INTRAVENOUS

## 2012-03-28 ENCOUNTER — Other Ambulatory Visit (HOSPITAL_BASED_OUTPATIENT_CLINIC_OR_DEPARTMENT_OTHER): Payer: 59 | Admitting: Lab

## 2012-03-28 DIAGNOSIS — C50919 Malignant neoplasm of unspecified site of unspecified female breast: Secondary | ICD-10-CM

## 2012-03-28 DIAGNOSIS — C50912 Malignant neoplasm of unspecified site of left female breast: Secondary | ICD-10-CM

## 2012-03-28 LAB — COMPREHENSIVE METABOLIC PANEL
ALT: 10 U/L (ref 0–35)
AST: 14 U/L (ref 0–37)
Albumin: 3.6 g/dL (ref 3.5–5.2)
Alkaline Phosphatase: 32 U/L — ABNORMAL LOW (ref 39–117)
BUN: 15 mg/dL (ref 6–23)
CO2: 25 mEq/L (ref 19–32)
Calcium: 9.2 mg/dL (ref 8.4–10.5)
Chloride: 106 mEq/L (ref 96–112)
Creatinine, Ser: 0.72 mg/dL (ref 0.50–1.10)
Glucose, Bld: 106 mg/dL — ABNORMAL HIGH (ref 70–99)
Potassium: 4.1 mEq/L (ref 3.5–5.3)
Sodium: 141 mEq/L (ref 135–145)
Total Bilirubin: 0.3 mg/dL (ref 0.3–1.2)
Total Protein: 6.9 g/dL (ref 6.0–8.3)

## 2012-03-28 LAB — CANCER ANTIGEN 27.29: CA 27.29: 19 U/mL (ref 0–39)

## 2012-03-28 LAB — CBC WITH DIFFERENTIAL/PLATELET
BASO%: 0.7 % (ref 0.0–2.0)
Basophils Absolute: 0 10*3/uL (ref 0.0–0.1)
EOS%: 1.5 % (ref 0.0–7.0)
Eosinophils Absolute: 0.1 10*3/uL (ref 0.0–0.5)
HCT: 34.6 % — ABNORMAL LOW (ref 34.8–46.6)
HGB: 11.2 g/dL — ABNORMAL LOW (ref 11.6–15.9)
LYMPH%: 25.5 % (ref 14.0–49.7)
MCH: 28.2 pg (ref 25.1–34.0)
MCHC: 32.4 g/dL (ref 31.5–36.0)
MCV: 87.3 fL (ref 79.5–101.0)
MONO#: 0.5 10*3/uL (ref 0.1–0.9)
MONO%: 9.6 % (ref 0.0–14.0)
NEUT#: 3 10*3/uL (ref 1.5–6.5)
NEUT%: 62.7 % (ref 38.4–76.8)
Platelets: 223 10*3/uL (ref 145–400)
RBC: 3.96 10*6/uL (ref 3.70–5.45)
RDW: 14.1 % (ref 11.2–14.5)
WBC: 4.8 10*3/uL (ref 3.9–10.3)
lymph#: 1.2 10*3/uL (ref 0.9–3.3)

## 2012-03-31 ENCOUNTER — Encounter: Payer: Self-pay | Admitting: Internal Medicine

## 2012-03-31 ENCOUNTER — Ambulatory Visit (INDEPENDENT_AMBULATORY_CARE_PROVIDER_SITE_OTHER): Payer: 59 | Admitting: Internal Medicine

## 2012-03-31 VITALS — BP 114/80 | HR 71 | Temp 97.7°F | Ht 60.0 in | Wt 233.5 lb

## 2012-03-31 DIAGNOSIS — M25569 Pain in unspecified knee: Secondary | ICD-10-CM

## 2012-03-31 DIAGNOSIS — F329 Major depressive disorder, single episode, unspecified: Secondary | ICD-10-CM

## 2012-03-31 DIAGNOSIS — F32A Depression, unspecified: Secondary | ICD-10-CM | POA: Insufficient documentation

## 2012-03-31 DIAGNOSIS — M25539 Pain in unspecified wrist: Secondary | ICD-10-CM

## 2012-03-31 DIAGNOSIS — M25562 Pain in left knee: Secondary | ICD-10-CM

## 2012-03-31 DIAGNOSIS — M25532 Pain in left wrist: Secondary | ICD-10-CM

## 2012-03-31 DIAGNOSIS — F341 Dysthymic disorder: Secondary | ICD-10-CM

## 2012-03-31 DIAGNOSIS — F419 Anxiety disorder, unspecified: Secondary | ICD-10-CM

## 2012-03-31 NOTE — Assessment & Plan Note (Signed)
D/w pt, declines further tx or counseling at this time, nonsuicidal

## 2012-03-31 NOTE — Assessment & Plan Note (Signed)
Ok for referral to ortho surgury second opinion,  to f/u any worsening symptoms or concerns
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
## 2012-03-31 NOTE — Patient Instructions (Addendum)
Continue all other medications as before You will be contacted regarding the referral for: left knee - Dr Thurston Hole, and left wrist - Dr Teressa Senter

## 2012-03-31 NOTE — Assessment & Plan Note (Signed)
Ok to refer to hand surgury second opinion per pt request, Continue all other medications as before

## 2012-04-01 ENCOUNTER — Telehealth: Payer: Self-pay | Admitting: Gastroenterology

## 2012-04-01 ENCOUNTER — Ambulatory Visit: Payer: 59 | Admitting: Gastroenterology

## 2012-04-01 NOTE — Telephone Encounter (Signed)
Pt states that she had her colon done and everything was fine, path report was good. Pt wants to cancel appt for OV, states she is not having any problems. Appt cancelled for pt.

## 2012-04-04 ENCOUNTER — Telehealth: Payer: Self-pay | Admitting: *Deleted

## 2012-04-04 ENCOUNTER — Ambulatory Visit (HOSPITAL_BASED_OUTPATIENT_CLINIC_OR_DEPARTMENT_OTHER): Payer: 59 | Admitting: Oncology

## 2012-04-04 VITALS — BP 125/79 | HR 82 | Temp 98.5°F | Ht 60.0 in | Wt 233.9 lb

## 2012-04-04 DIAGNOSIS — C50912 Malignant neoplasm of unspecified site of left female breast: Secondary | ICD-10-CM

## 2012-04-04 DIAGNOSIS — C50119 Malignant neoplasm of central portion of unspecified female breast: Secondary | ICD-10-CM

## 2012-04-04 DIAGNOSIS — Z170 Estrogen receptor positive status [ER+]: Secondary | ICD-10-CM

## 2012-04-04 NOTE — Telephone Encounter (Signed)
Gave patient appointment for lab two weeks before md appointment on 07-05-2012
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
## 2012-05-05 ENCOUNTER — Encounter (HOSPITAL_COMMUNITY): Payer: Self-pay | Admitting: Emergency Medicine

## 2012-05-05 ENCOUNTER — Emergency Department (HOSPITAL_COMMUNITY)
Admission: EM | Admit: 2012-05-05 | Discharge: 2012-05-05 | Disposition: A | Discharge status: Home / Self Care | Payer: 59 | Source: Home / Self Care | Attending: Emergency Medicine | Admitting: Emergency Medicine

## 2012-05-05 DIAGNOSIS — L0291 Cutaneous abscess, unspecified: Secondary | ICD-10-CM

## 2012-05-05 MED ORDER — OXYCODONE-ACETAMINOPHEN 5-325 MG PO TABS: ORAL_TABLET | ORAL | Status: AC

## 2012-05-05 MED ORDER — NYSTATIN 100000 UNIT/GM EX CREA: TOPICAL_CREAM | CUTANEOUS | Status: DC

## 2012-05-05 MED ORDER — CEFTRIAXONE SODIUM 1 G IJ SOLR
INTRAMUSCULAR | Status: AC
  Filled 2012-05-05: qty 10

## 2012-05-05 MED ORDER — LIDOCAINE HCL (PF) 1 % IJ SOLN
INTRAMUSCULAR | Status: AC
  Filled 2012-05-05: qty 5

## 2012-05-05 MED ORDER — CLINDAMYCIN HCL 300 MG PO CAPS: 300.0000 mg | ORAL_CAPSULE | Freq: Four times a day (QID) | ORAL | Status: AC

## 2012-05-05 MED ORDER — CEFTRIAXONE SODIUM 1 G IJ SOLR
1.0000 g | Freq: Once | INTRAMUSCULAR | Status: AC
  Administered 2012-05-05: 1 g via INTRAMUSCULAR

## 2012-05-05 NOTE — ED Notes (Signed)
Pain w fever

## 2012-05-05 NOTE — ED Provider Notes (Signed)
Chief Complaint  Patient presents with  . Recurrent Skin Infections    History of Present Illness:    Jazzie is a 56 year old female with a one-week history of a painful abscess on her right buttock. This has not drained any pus. She has had a fever. She's had abscesses before but not in that particular area. She also has a chronic, long-standing history of an irritated area in the intergluteal cleft. She's been back and forth his dermatologist many times because of this, and nothing seems to help. Because of the abscess she went to an urgent care and [facility] today and they told her they could not help her and referred her here.  Review of Systems:  Other than noted above, the patient denies any of the following symptoms: Systemic:  No fever, chills or sweats. Skin:  No rash or itching.  PMFSH:  Past medical history, family history, social history, meds, and allergies were reviewed.  No history of diabetes or prior history of abscesses or MRSA.  Physical Exam:   Vital signs:  BP 122/78  Pulse 102  Temp 101 F (38.3 C) (Oral)  Resp 18  SpO2 99% Skin:  On the right buttock, just to the right of the intergluteal cleft, at the superior end of the intergluteal cleft, was 85 cm area of induration. There was possibly some fluctuance at the center but no drainage. Also in the intergluteal cleft there was a cracked and irritated area which was not draining a pus.  Skin exam was otherwise normal.  No rash. Ext:  Distal pulses were full, patient has full ROM of all joints.  Procedure:  Verbal informed consent was obtained.  The patient was informed of the risks and benefits of the procedure and understands and accepts.  Identity of the patient was verified verbally and by wristband.   The abscess area described above was prepped with Betadine and alcohol and anesthetized with 5 mL of 2% Xylocaine with epinephrine.  Using a #11 scalpel blade, a singe straight incision was made into the area of  fluctulence, yielding a small amount of prurulent drainage.  Routine cultures were obtained.  Blunt dissection was used to break up loculations and the resulting wound cavity was packed with 1/4 inch Iodoform gauze.  A sterile pressure dressing was applied.  Course in Urgent Care Center:   She was given Rocephin 1 g IM and tolerated this well without any immediate side effects.  Assessment:  The encounter diagnosis was Abscess.  Plan:   1.  The following meds were prescribed:   New Prescriptions   CLINDAMYCIN (CLEOCIN) 300 MG CAPSULE    Take 1 capsule (300 mg total) by mouth 4 (four) times daily.   NYSTATIN CREAM (MYCOSTATIN)    Apply to affected area 4 times daily   OXYCODONE-ACETAMINOPHEN (PERCOCET) 5-325 MG PER TABLET    1 to 2 tablets every 6 hours as needed for pain.   2.  The patient was instructed in symptomatic care and handouts were given. 3.  The patient was instructed to leave the dressing in place and return again in 48 hours for packing removal.   Reuben Likes, MD 05/05/12 2025

## 2012-05-05 NOTE — ED Notes (Signed)
No signs of allergic response to medications; discussed medication compliance , precautions w tylenol containing medications , sedation tye mediations for pain

## 2012-05-07 ENCOUNTER — Encounter (HOSPITAL_COMMUNITY): Payer: Self-pay | Admitting: Emergency Medicine

## 2012-05-07 ENCOUNTER — Emergency Department (HOSPITAL_COMMUNITY)
Admission: EM | Admit: 2012-05-07 | Discharge: 2012-05-07 | Disposition: A | Discharge status: Home / Self Care | Payer: 59 | Source: Home / Self Care

## 2012-05-07 DIAGNOSIS — IMO0001 Reserved for inherently not codable concepts without codable children: Secondary | ICD-10-CM

## 2012-05-07 DIAGNOSIS — Z4800 Encounter for change or removal of nonsurgical wound dressing: Secondary | ICD-10-CM

## 2012-05-07 HISTORY — DX: Unspecified osteoarthritis, unspecified site: M19.90

## 2012-05-07 HISTORY — DX: Essential (primary) hypertension: I10

## 2012-05-07 NOTE — ED Provider Notes (Addendum)
History     CSN: [id number]  Arrival date & time 05/07/12  1654   First MD Initiated Contact with Patient 05/07/12 1715      Chief Complaint  Patient presents with  . Wound Check    This pleasant African American female presents to the urgent care Center for wound check. She came here 7/18 and had incision and drainage done in the intergluteal cleft She states she is felt "lousy" yesterday and try to measure her temperature with a thermometer but could not measure the same She is compliant on the clindamycin that she was prescribed and is taking it as she has been told to.  She states she's very uncomfortable bending down and laying on that area but the pain medications Her pain level currently is 3 on 10 with Medicaid    tHPI  Past Medical History  Diagnosis Date  . HYPERLIPIDEMIA 05/11/2008  . OBSTRUCTIVE SLEEP APNEA 05/11/2008  . RASH-NONVESICULAR 06/29/2008  . COLONIC POLYPS, HX OF 05/11/2008  . GENITAL HERPES, HX OF 08/08/2009  . Inverted nipple   . Menorrhagia   . 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 LLQ pain   . Hernia   . Low iron   . H/O gonorrhea   . Bacterial infection   . Trichomonas   . H/O irritable bowel syndrome   . Breast cancer     2012  . Hypertension   . Arthritis     Past Surgical History  Procedure Date  . Cesarean section   . Right achilles tendon     and left  . S/p ear surgury   . Right ovarian cyst     hx  . S/p extra uterine fibroid 2006  . S/p left knee replacement 2007  . Dilation and curettage of uterus   . Breast cyst excision 1973  . Uterine fibroid surgery 2006    Family History  Problem Relation Age of Onset  . Diabetes Mother   . Hypertension Mother   . Stroke Mother   . Heart disease Father     COPD  . Alcohol abuse Father     ETOH dependence  . Colon cancer Neg Hx     History  Substance Use Topics  . Smoking status: Never Smoker   . Smokeless tobacco: Never Used  . Alcohol Use: Yes     occasionally drinks  wine    OB History    Grav Para Term Preterm Abortions TAB SAB Ect Mult Living                  Review of Systems  chest pain or shortness of breath no blurred or double vision No specific fever or chills however she cannot be completely sure No nausea no vomiting  One episode of diarrhea so far  Allergies  Codeine; Cymbalta; Darvon; and Propoxyphene hcl  Home Medications   Current Outpatient Rx  Name Route Sig Dispense Refill  . CHOLECALCIFEROL 400 UNITS PO TABS Oral Take 1,000 Units by mouth daily.    [name] CLINDAMYCIN HCL 300 MG PO CAPS Oral Take 1 capsule (300 mg total) by mouth 4 (four) times daily. 40 capsule 0  . IRON 325 (65 FE) MG PO TABS Oral Take by mouth daily.      . OMEGA-3 FATTY ACIDS 1000 MG PO CAPS Oral Take 1 g by mouth daily.    [name] HYOSCYAMINE SULFATE 0.125 MG SL SUBL Sublingual Place 2 tablets (0.25 mg total) under the tongue every 4 (  four) hours as needed for cramping. 30 tablet 0  . NYSTATIN 100000 UNIT/GM EX CREA  Apply to affected area 4 times daily 30 g 2  . OXYCODONE-ACETAMINOPHEN 5-325 MG PO TABS  1 to 2 tablets every 6 hours as needed for pain. 20 tablet 0  . TAMOXIFEN CITRATE 20 MG PO TABS Oral Take 1 tablet (20 mg total) by mouth daily. 90 tablet 1  . TELMISARTAN-HCTZ 80-25 MG PO TABS Oral Take 1 tablet by mouth daily. 90 tablet 3  . VALACYCLOVIR HCL 1 G PO TABS Oral Take 1 tablet (1,000 mg total) by mouth daily. 90 tablet 3  . VITAMIN C 500 MG PO TABS Oral Take 500 mg by mouth daily.      BP 109/78  Pulse 76  Temp 98.6 F (37 C) (Oral)  Resp 17  SpO2 99%  LMP 02/06/2012  Physical Exam Morbidly obese pleasant African American female in no apparent distress Chest clear clear, no added sound or tactile vocal resonance and fremitus S1-S2 no murmur rub or gallop Abdomen soft nontender nondistended Skin-patient has an excoriated type her trophic and hyperpigmented area in the middle gluteal cleft Area of the incision and drainage on the left  buttock appears to be clean and there is no erythema around the original incision Form dressing was removed and about 3-4 cc of serosanguineous purulent material was expressed. .  ED Course  Procedures (including critical care time)  Labs Reviewed - No data to display No results found.   No diagnosis found.    MDM   patient is a 56 year old African American female who presents for wound check  The wound is co piously irrigated x4 with a 10 cc syringe. Pressure dressing was applied to the area Patient experienced minimal discomfort in same and I encouraged patient to followup in one to 2 days for repeat wound  I explained patient to supplement her gut flora with increasing amounts of yogurt she has had a little diarrhea       Rhetta Mura, MD 05/07/12 1818  Rhetta Mura, MD 05/07/12 1821

## 2012-05-07 NOTE — ED Notes (Signed)
Pt stated that she arrived Thursday and had a packing place in a wound on right buttocks. Pt is here today to have the packing removed. Denies increase of pain but it continues to hurt. Currently on ABT for wound.

## 2012-05-08 LAB — CULTURE, ROUTINE-ABSCESS: Culture: NO GROWTH

## 2012-05-09 ENCOUNTER — Emergency Department (HOSPITAL_COMMUNITY)
Admission: EM | Admit: 2012-05-09 | Discharge: 2012-05-09 | Disposition: A | Discharge status: Home / Self Care | Payer: 59 | Source: Home / Self Care | Attending: Emergency Medicine | Admitting: Emergency Medicine

## 2012-05-09 ENCOUNTER — Encounter (HOSPITAL_COMMUNITY): Payer: Self-pay

## 2012-05-09 DIAGNOSIS — M5430 Sciatica, unspecified side: Secondary | ICD-10-CM

## 2012-05-09 DIAGNOSIS — IMO0001 Reserved for inherently not codable concepts without codable children: Secondary | ICD-10-CM

## 2012-05-09 DIAGNOSIS — M549 Dorsalgia, unspecified: Secondary | ICD-10-CM

## 2012-05-09 MED ORDER — CYCLOBENZAPRINE HCL 10 MG PO TABS: 10.0000 mg | ORAL_TABLET | Freq: Three times a day (TID) | ORAL | Status: DC | PRN

## 2012-05-09 MED ORDER — IBUPROFEN 800 MG PO TABS: 800.0000 mg | ORAL_TABLET | Freq: Three times a day (TID) | ORAL | Status: DC

## 2012-05-09 MED ORDER — IBUPROFEN 800 MG PO TABS
ORAL_TABLET | ORAL | Status: AC
  Filled 2012-05-09: qty 1

## 2012-05-09 MED ORDER — IBUPROFEN 800 MG PO TABS
800.0000 mg | ORAL_TABLET | Freq: Once | ORAL | Status: AC
  Administered 2012-05-09: 800 mg via ORAL

## 2012-05-09 NOTE — ED Notes (Signed)
Her for wound recheck; moderate yellow/grey drainage from wound

## 2012-05-09 NOTE — ED Provider Notes (Signed)
History     CSN: [id number]  Arrival date & time 05/09/12  1102   First MD Initiated Contact with Patient 05/09/12 1107      Chief Complaint  Patient presents with  . Wound Check    (Consider location/radiation/quality/duration/timing/severity/associated sxs/prior treatment) HPI Comments: Patient presents urgent care after 5 days since she underwent an incision and drainage of an abscess in her left buttocks and intergluteal area. Dressing was removed 2 days ago, patient continues compliant with antibiotics and denies having had any problems while taken as such his diarrheas or nausea or abdominal pain. Patient presents today complaining of pain that starts in her lower back and right sided and shoots down all the way to the anterior and lateral aspect of her thigh and toe worse her groin area. At times feels like a shooting pain or like spasms. Movement makes it much worse, and when she tries to raise her leg. Patient denies any associated symptoms such as numbness, paresthesias or weakness, patient also denies any urinary symptoms or sagittal pattern paresthesias. She describes it even prior to the procedure she was laying flat on her bed for hours at a time because of the pain at the site of where she had the abscess. Patient denies any fevers, generalized malaise, myalgias or changes in appetite.   Patient is a 56 y.o. female presenting with wound check. The history is provided by the patient.  Wound Check  She was treated in the ED 5 to 10 days ago. Previous treatment in the ED includes I&D of abscess and oral antibiotics. Treatments since wound repair include oral antibiotics. The fever has been present for more than 4 days. There has been clear discharge from the wound. The pain has improved. There is difficulty moving the extremity or digit due to pain.    Past Medical History  Diagnosis Date  . HYPERLIPIDEMIA 05/11/2008  . OBSTRUCTIVE SLEEP APNEA 05/11/2008  . RASH-NONVESICULAR  06/29/2008  . COLONIC POLYPS, HX OF 05/11/2008  . GENITAL HERPES, HX OF 08/08/2009  . Inverted nipple   . Menorrhagia   . 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 LLQ pain   . Hernia   . Low iron   . H/O gonorrhea   . Bacterial infection   . Trichomonas   . H/O irritable bowel syndrome   . Breast cancer     2012  . Hypertension   . Arthritis     Past Surgical History  Procedure Date  . Cesarean section   . Right achilles tendon     and left  . S/p ear surgury   . Right ovarian cyst     hx  . S/p extra uterine fibroid 2006  . S/p left knee replacement 2007  . Dilation and curettage of uterus   . Breast cyst excision 1973  . Uterine fibroid surgery 2006    Family History  Problem Relation Age of Onset  . Diabetes Mother   . Hypertension Mother   . Stroke Mother   . Heart disease Father     COPD  . Alcohol abuse Father     ETOH dependence  . Colon cancer Neg Hx     History  Substance Use Topics  . Smoking status: Never Smoker   . Smokeless tobacco: Never Used  . Alcohol Use: Yes     occasionally drinks wine    OB History    Grav Para Term Preterm Abortions TAB SAB Ect Mult Living  Review of Systems  Constitutional: Positive for activity change. Negative for fever, chills, fatigue and unexpected weight change.  Genitourinary: Negative for dysuria, frequency, flank pain, decreased urine volume, difficulty urinating, pelvic pain and dyspareunia.  Skin: Positive for wound.  Neurological: Negative for weakness and numbness.    Allergies  Codeine; Cymbalta; Darvon; and Propoxyphene hcl  Home Medications   Current Outpatient Rx  Name Route Sig Dispense Refill  . CHOLECALCIFEROL 400 UNITS PO TABS Oral Take 1,000 Units by mouth daily.    [name] CLINDAMYCIN HCL 300 MG PO CAPS Oral Take 1 capsule (300 mg total) by mouth 4 (four) times daily. 40 capsule 0  . IRON 325 (65 FE) MG PO TABS Oral Take by mouth daily.      . OMEGA-3 FATTY ACIDS 1000 MG PO  CAPS Oral Take 1 g by mouth daily.    [name] HYOSCYAMINE SULFATE 0.125 MG SL SUBL Sublingual Place 2 tablets (0.25 mg total) under the tongue every 4 (four) hours as needed for cramping. 30 tablet 0  . NYSTATIN 100000 UNIT/GM EX CREA  Apply to affected area 4 times daily 30 g 2  . OXYCODONE-ACETAMINOPHEN 5-325 MG PO TABS  1 to 2 tablets every 6 hours as needed for pain. 20 tablet 0  . TAMOXIFEN CITRATE 20 MG PO TABS Oral Take 1 tablet (20 mg total) by mouth daily. 90 tablet 1  . TELMISARTAN-HCTZ 80-25 MG PO TABS Oral Take 1 tablet by mouth daily. 90 tablet 3  . VALACYCLOVIR HCL 1 G PO TABS Oral Take 1 tablet (1,000 mg total) by mouth daily. 90 tablet 3  . VITAMIN C 500 MG PO TABS Oral Take 500 mg by mouth daily.      BP 111/47  Pulse 85  Temp 98.4 F (36.9 C) (Oral)  Resp 16  SpO2 100%  LMP 02/06/2012  Physical Exam  Nursing note and vitals reviewed. Constitutional: Vital signs are normal. She appears well-developed and well-nourished.  Non-toxic appearance. She does not have a sickly appearance. She does not appear ill. No distress.  HENT:  Head: Normocephalic.  Musculoskeletal: She exhibits tenderness.       Back:  Neurological: She is alert.  Skin: No erythema.       ED Course  Procedures (including critical care time)  Labs Reviewed - No data to display No results found.   No diagnosis found.    MDM  Patient returns describing a sciatic pattern-type pain. During exam I did not observe any signs consistent with spreading of this localize infection of her gluteal area. No inguinal or femoral hernias were noted on her exam. No abdominal pain on deep palpation on her lower abdomen. Area of infection still draining some colored exudate. The area around it (prior incision) with minimal tenderness suggesting that she is improving. Culture results no microorganism growth. Have advised patient to continue taking her Percocet, and to also take Motrin every 8 hours and will prescribe  a muscle relaxer. Have advised her to followup with her primary care doctor in 2 days if no improvement and if any worsening symptoms or abdominal pain or any fevers to go to the emergency department for further evaluation and management. I believe based on her symptoms and exam that possibly this sciatic or lumbar pain with referred pain to worsen or right upper lower extremity possibly as a result of her position for the last several days.       Jimmie Molly, MD 05/09/12 1201

## 2012-05-11 ENCOUNTER — Encounter (HOSPITAL_COMMUNITY): Payer: Self-pay | Admitting: *Deleted

## 2012-05-11 ENCOUNTER — Emergency Department (HOSPITAL_COMMUNITY)
Admission: EM | Admit: 2012-05-11 | Discharge: 2012-05-11 | Disposition: A | Discharge status: Home / Self Care | Payer: 59 | Attending: Emergency Medicine | Admitting: Emergency Medicine

## 2012-05-11 DIAGNOSIS — L0231 Cutaneous abscess of buttock: Secondary | ICD-10-CM | POA: Insufficient documentation

## 2012-05-11 DIAGNOSIS — M129 Arthropathy, unspecified: Secondary | ICD-10-CM | POA: Insufficient documentation

## 2012-05-11 DIAGNOSIS — M62838 Other muscle spasm: Secondary | ICD-10-CM

## 2012-05-11 DIAGNOSIS — E785 Hyperlipidemia, unspecified: Secondary | ICD-10-CM | POA: Insufficient documentation

## 2012-05-11 DIAGNOSIS — G4733 Obstructive sleep apnea (adult) (pediatric): Secondary | ICD-10-CM | POA: Insufficient documentation

## 2012-05-11 DIAGNOSIS — I10 Essential (primary) hypertension: Secondary | ICD-10-CM | POA: Insufficient documentation

## 2012-05-11 DIAGNOSIS — Z853 Personal history of malignant neoplasm of breast: Secondary | ICD-10-CM | POA: Insufficient documentation

## 2012-05-11 MED ORDER — DIAZEPAM 5 MG PO TABS: 5.0000 mg | ORAL_TABLET | Freq: Four times a day (QID) | ORAL | Status: AC | PRN

## 2012-05-11 NOTE — ED Notes (Signed)
Pt went to [facility] urgent care with an 102F Thursday with an infected boil in right side of buttocks. Pt transferred to [facility] urgent care. [place] lanced, drained, and packed it. Pt returned to [facility] Saturday where they removed packing, rebandaged it, and told her to go back Monday. Pt states [facility] felt like it should have been cut out because it was causing pain in her back side. Pt went back Monday. Pt had to walk with a cane due to spasms in back side that had her in tears. Pt went back on Monday where they told her it looked good, rebandaged, and was prescribed Flexiril for the muscle spasms and Ibuprofen 800 mg for pain. Pt went to orthopedics yesterday where she was injected on left side for 2 shots with pain medicine. Pt still reports having secretions that turned green and pain 4/10.

## 2012-05-11 NOTE — ED Provider Notes (Signed)
History     CSN: [id number]  Arrival date & time 05/11/12  0231   First MD Initiated Contact with Patient 05/11/12 [phone]      Chief Complaint  Patient presents with  . Abscess   HPI  History provided by the patient. Patient is a 56 year old female returns with complaints of increasing pain from an abscess infection of right buttocks. Patient reports being treated for abscess with I&D several days ago. Patient currently taking clindamycin. Patient states that she's had increased swelling and pain over the past day. There is only a small amount of drainage at times. She denies having any fever, chills, sweats. Symptoms are associated with some occasional muscle spasms and cramps to the buttocks and thigh area. Patient has been taking antibiotic and pain medications as prescribed.    Past Medical History  Diagnosis Date  . HYPERLIPIDEMIA 05/11/2008  . OBSTRUCTIVE SLEEP APNEA 05/11/2008  . RASH-NONVESICULAR 06/29/2008  . COLONIC POLYPS, HX OF 05/11/2008  . GENITAL HERPES, HX OF 08/08/2009  . Inverted nipple   . Menorrhagia   . 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 LLQ pain   . Hernia   . Low iron   . H/O gonorrhea   . Bacterial infection   . Trichomonas   . H/O irritable bowel syndrome   . Breast cancer     2012  . Hypertension   . Arthritis     Past Surgical History  Procedure Date  . Cesarean section   . Right achilles tendon     and left  . S/p ear surgury   . Right ovarian cyst     hx  . S/p extra uterine fibroid 2006  . S/p left knee replacement 2007  . Dilation and curettage of uterus   . Breast cyst excision 1973  . Uterine fibroid surgery 2006    Family History  Problem Relation Age of Onset  . Diabetes Mother   . Hypertension Mother   . Stroke Mother   . Heart disease Father     COPD  . Alcohol abuse Father     ETOH dependence  . Colon cancer Neg Hx     History  Substance Use Topics  . Smoking status: Never Smoker   . Smokeless tobacco: Never  Used  . Alcohol Use: Yes     occasionally drinks wine    OB History    Grav Para Term Preterm Abortions TAB SAB Ect Mult Living                  Review of Systems  Constitutional: Negative for fever and chills.  Gastrointestinal: Negative for nausea and vomiting.  Musculoskeletal: Positive for myalgias.  Skin:       Abscess    Allergies  Codeine; Cymbalta; Darvon; and Propoxyphene hcl  Home Medications   Current Outpatient Rx  Name Route Sig Dispense Refill  . CHOLECALCIFEROL 400 UNITS PO TABS Oral Take 1,000 Units by mouth daily.    [name] CLINDAMYCIN HCL 300 MG PO CAPS Oral Take 1 capsule (300 mg total) by mouth 4 (four) times daily. 40 capsule 0  . CYCLOBENZAPRINE HCL 10 MG PO TABS Oral Take 1 tablet (10 mg total) by mouth 3 (three) times daily as needed for muscle spasms. 15 tablet 0  . IRON 325 (65 FE) MG PO TABS Oral Take by mouth daily.      . OMEGA-3 FATTY ACIDS 1000 MG PO CAPS Oral Take 1 g by mouth daily.    [name]  IBUPROFEN 800 MG PO TABS Oral Take 1 tablet (800 mg total) by mouth 3 (three) times daily. 15 tablet 0  . OXYCODONE-ACETAMINOPHEN 5-325 MG PO TABS  1 to 2 tablets every 6 hours as needed for pain. 20 tablet 0  . TAMOXIFEN CITRATE 20 MG PO TABS Oral Take 1 tablet (20 mg total) by mouth daily. 90 tablet 1  . TELMISARTAN-HCTZ 80-25 MG PO TABS Oral Take 1 tablet by mouth daily. 90 tablet 3  . VALACYCLOVIR HCL 1 G PO TABS Oral Take 1 tablet (1,000 mg total) by mouth daily. 90 tablet 3  . VITAMIN C 500 MG PO TABS Oral Take 500 mg by mouth daily.    [name] HYOSCYAMINE SULFATE 0.125 MG SL SUBL Sublingual Place 2 tablets (0.25 mg total) under the tongue every 4 (four) hours as needed for cramping. 30 tablet 0  . NYSTATIN 100000 UNIT/GM EX CREA  Apply to affected area 4 times daily 30 g 2    BP 126/64  Pulse 58  Temp 98.6 F (37 C)  Resp 20  SpO2 100%  LMP 02/06/2012  Physical Exam  Nursing note and vitals reviewed. Constitutional: She is oriented to person, place, and  time. She appears well-developed and well-nourished. No distress.  HENT:  Head: Normocephalic.  Cardiovascular: Normal rate and regular rhythm.   Pulmonary/Chest: Effort normal and breath sounds normal.  Neurological: She is alert and oriented to person, place, and time.  Skin: Skin is warm and dry. No rash noted.       Induration and tenderness to right buttocks area. There is a healing small incision. Small amount of bleeding and drainage from this area.  Psychiatric: She has a normal mood and affect. Her behavior is normal.    ED Course  Procedures   INCISION AND DRAINAGE Performed by: Angus Seller Consent: Verbal consent obtained. Risks and benefits: risks, benefits and alternatives were discussed Type: abscess  Body area: Right buttocks  Anesthesia: local infiltration  Local anesthetic: lidocaine 2% with epinephrine  Anesthetic total: 5 ml  Complexity: complex Blunt dissection to break up loculations  Drainage: purulent  Drainage amount: Small   Packing material: 1/4 in iodoform gauze  Patient tolerance: Patient tolerated the procedure well with no immediate complications.       1. Muscle spasm   2. Abscess of right buttock       MDM  Patient seen and evaluated. Bedside ultrasound performed showing persistent abscess pocket to right buttocks. I&D performed to reopen incision.        Angus Seller, [place] 05/11/12 [phone]

## 2012-05-11 NOTE — ED Provider Notes (Signed)
Medical screening examination/treatment/procedure(s) were performed by non-physician practitioner and as supervising physician I was immediately available for consultation/collaboration.  Olivia Mackie, MD 05/11/12 [phone]

## 2012-05-11 NOTE — ED Notes (Signed)
Abscess repacked by PA. Gauze with tape placed over.

## 2012-05-11 NOTE — ED Notes (Signed)
PA at bedside.

## 2012-05-11 NOTE — ED Notes (Signed)
Pt seen at [facility] for abscess on buttocks; sent to [facility] for lancing on Thursday.  Packing removed on Saturday.  Now has developed yellow/greenish drainage.  Jolting pain in lower back area.  Treated in ER on Monday for back pain referred to orthopedist for injections lower back yesterday.

## 2012-05-13 ENCOUNTER — Ambulatory Visit (INDEPENDENT_AMBULATORY_CARE_PROVIDER_SITE_OTHER): Payer: 59 | Admitting: General Surgery

## 2012-05-13 ENCOUNTER — Encounter (INDEPENDENT_AMBULATORY_CARE_PROVIDER_SITE_OTHER): Payer: Self-pay | Admitting: General Surgery

## 2012-05-13 VITALS — BP 136/88 | HR 60 | Temp 98.1°F | Resp 16 | Ht 60.0 in | Wt 226.8 lb

## 2012-05-13 DIAGNOSIS — L0501 Pilonidal cyst with abscess: Secondary | ICD-10-CM

## 2012-05-13 NOTE — Progress Notes (Signed)
Subjective:   Infection buttock  Patient ID: [name], female   DOB: August 29, 1956, 56 y.o.   MRN: [id number]  HPI Patient is referred from the emergency room for ongoing care for soft tissue infection that a box. She presented to the [facility] urgent care about one week ago and had incision and drainage of an apparent abscess on the upper right buttock. She has been back to the emergency room several times for wound packing change. She has been on clindamycin. She reports that it feels about the same, not better or worse. No fever or chills. After examining her and finding evidence of chronic infection at the buttock crease as described below she states that 6 or 8 years ago she had an abscess in that area that was drained and she's had continued open wound for which she has seen multiple physicians. She does not however have any further acute abscesses until this occurrence last week. Past Medical History  Diagnosis Date  . HYPERLIPIDEMIA 05/11/2008  . OBSTRUCTIVE SLEEP APNEA 05/11/2008  . RASH-NONVESICULAR 06/29/2008  . COLONIC POLYPS, HX OF 05/11/2008  . GENITAL HERPES, HX OF 08/08/2009  . Inverted nipple   . Menorrhagia   . 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 LLQ pain   . Hernia   . Low iron   . H/O gonorrhea   . Bacterial infection   . Trichomonas   . H/O irritable bowel syndrome   . Breast cancer     2012  . Hypertension   . Arthritis   . Abscess of buttock    Past Surgical History  Procedure Date  . Cesarean section   . Right achilles tendon     and left  . S/p ear surgury   . Right ovarian cyst     hx  . S/p extra uterine fibroid 2006  . S/p left knee replacement 2007  . Dilation and curettage of uterus   . Breast cyst excision 1973  . Uterine fibroid surgery 2006   Current Outpatient Prescriptions  Medication Sig Dispense Refill  . cholecalciferol (VITAMIN D) 400 UNITS TABS Take 1,000 Units by mouth daily.      . clindamycin (CLEOCIN) 300 MG capsule Take  1 capsule (300 mg total) by mouth 4 (four) times daily.  40 capsule  0  . cyclobenzaprine (FLEXERIL) 10 MG tablet Take 1 tablet (10 mg total) by mouth 3 (three) times daily as needed for muscle spasms.  15 tablet  0  . diazepam (VALIUM) 5 MG tablet Take 1 tablet (5 mg total) by mouth every 6 (six) hours as needed for anxiety (spasms).  15 tablet  0  . Ferrous Sulfate (IRON) 325 (65 FE) MG TABS Take by mouth daily.        . fish oil-omega-3 fatty acids 1000 MG capsule Take 1 g by mouth daily.      . hyoscyamine (LEVSIN SL) 0.125 MG SL tablet Place 0.25 mg under the tongue every 4 (four) hours as needed.      [name] ibuprofen (ADVIL,MOTRIN) 800 MG tablet Take 1 tablet (800 mg total) by mouth 3 (three) times daily.  15 tablet  0  . MYRBETRIQ 25 MG TB24 daily.      [name] nystatin cream (MYCOSTATIN) Apply to affected area 4 times daily  30 g  2  . oxyCODONE-acetaminophen (PERCOCET) 5-325 MG per tablet 1 to 2 tablets every 6 hours as needed for pain.  20 tablet  0  . tamoxifen (NOLVADEX) 20  MG tablet Take 1 tablet (20 mg total) by mouth daily.  90 tablet  1  . telmisartan-hydrochlorothiazide (MICARDIS HCT) 80-25 MG per tablet Take 1 tablet by mouth daily.  90 tablet  3  . topical emolient (BIAFINE) foam Ad lib.      . valACYclovir (VALTREX) 1000 MG tablet Take 1 tablet (1,000 mg total) by mouth daily.  90 tablet  3  . vitamin C (ASCORBIC ACID) 500 MG tablet Take 500 mg by mouth daily.      [name] DISCONTD: hyoscyamine (LEVSIN SL) 0.125 MG SL tablet Place 2 tablets (0.25 mg total) under the tongue every 4 (four) hours as needed for cramping.  30 tablet  0  . DISCONTD: tolterodine (DETROL LA) 4 MG 24 hr capsule Take 4 mg by mouth daily.       Allergies  Allergen Reactions  . Codeine   . Cymbalta (Duloxetine Hcl) Other (See Comments)    Head felt funny, ? thinking not right  . Darvon   . Propoxyphene Hcl      Review of Systems  Constitutional: Negative for fever and chills.  Musculoskeletal: Positive for back  pain.       Objective:   Physical Exam BP 136/88  Pulse 60  Temp 98.1 F (36.7 C) (Temporal)  Resp 16  Ht 5' (1.524 m)  Wt 226 lb 12.8 oz (102.876 kg)  BMI 44.29 kg/m2  LMP 02/06/2012 General: Obese African American female in no acute distress Skin/extremities: Pertinent exam is limited to this area. There is an incision and drainage wound about 7 mm in the upper right buttock about 6 or 7 cm lateral to the upper end of the gluteal crease. In the gluteal crease extending for about 8 cm is a chronic appearing linear wound with some overlying exudate. There is no significant induration or erythema.  I explored the I&D site with a gauze on and it tracks over toward the midline into a several centimeter pocket which appears well open and without any purulent drainage. This was cleaned and packed with iodoform gauze.  Assessment and plan: This appears to me to be a chronic pilonidal infection that has developed a lateral track that was drained. It appears to be well drained without ongoing cellulitis. The area was repacked which she will remove in 2 days. I discussed the nature pilonidal disease with her and give her some literature. She will continue her current antibiotics. She is to return to the office next week for a wound check.

## 2012-05-16 ENCOUNTER — Ambulatory Visit: Payer: 59 | Admitting: Gastroenterology

## 2012-05-20 ENCOUNTER — Encounter (INDEPENDENT_AMBULATORY_CARE_PROVIDER_SITE_OTHER): Payer: Self-pay | Admitting: Surgery

## 2012-05-20 ENCOUNTER — Ambulatory Visit (INDEPENDENT_AMBULATORY_CARE_PROVIDER_SITE_OTHER): Payer: 59 | Admitting: Surgery

## 2012-05-20 VITALS — BP 136/88 | HR 64 | Temp 97.0°F | Resp 18 | Ht 60.0 in | Wt 226.2 lb

## 2012-05-20 DIAGNOSIS — Z09 Encounter for follow-up examination after completed treatment for conditions other than malignant neoplasm: Secondary | ICD-10-CM

## 2012-05-20 NOTE — Progress Notes (Signed)
Subjective:     Patient ID: [name], female   DOB: 06/12/1956, 56 y.o.   MRN: [id number]  HPI She is here for followup status post incision and drainage of a chronic pilonidal infected cyst by Dr. Johna Young last week. She has no complaints today he is doing well.  Review of Systems     Objective:   Physical Exam    The gluteal cleft wound is healing well. 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 She will followup with Dr. Johna Young to discuss definitive surgery

## 2012-06-10 ENCOUNTER — Ambulatory Visit (INDEPENDENT_AMBULATORY_CARE_PROVIDER_SITE_OTHER): Payer: 59 | Admitting: General Surgery

## 2012-06-10 VITALS — BP 118/70 | HR 80 | Temp 98.3°F | Resp 18 | Ht 60.0 in | Wt 227.8 lb

## 2012-06-10 DIAGNOSIS — L988 Other specified disorders of the skin and subcutaneous tissue: Secondary | ICD-10-CM | POA: Insufficient documentation

## 2012-06-10 NOTE — Patient Instructions (Signed)
After debriding and drying the affected skin apply a thin layer of Desitin ( zinc oxide ointment).

## 2012-06-10 NOTE — Progress Notes (Signed)
Chief complaint: Followup abscess  History: Patient returns for followup status post incision and drainage of what appears to be a pilonidal abscess near her coccyx. She has a chronic wound in the gluteal cleft there is been present for a number of years.  Exam: Skin extremities: The I&D site has healed completely. There is some minimal induration. Again noted is an approximately 8 cm long midline wound to 3 mm wide with some exudate and irritation of thickening of the skin on either side of the buttocks  Assessment and plan: Pilonidal disease, status post I&D of abscess. This acute process seems to be resolving. She has a chronic wound and discharge. We again discussed this in detail surgical treatment which I think would require a significant soft tissue excision with a large open wound that would take quite a while to heal. We discussed that nonhealing is an issue. Her skin is very macerated from the moisture and I suggested she use a barrier such as zinc oxide. She was given literature regarding the procedure. She will think things over and I will see her back in 4 weeks for followup.[name]

## 2012-06-17 ENCOUNTER — Telehealth: Payer: Self-pay | Admitting: *Deleted

## 2012-06-17 NOTE — Telephone Encounter (Signed)
Patient confirmed over the phone the new date and time 06-24-2012 at 8:30

## 2012-06-21 ENCOUNTER — Other Ambulatory Visit: Payer: 59 | Admitting: Lab

## 2012-06-24 ENCOUNTER — Other Ambulatory Visit (HOSPITAL_BASED_OUTPATIENT_CLINIC_OR_DEPARTMENT_OTHER): Payer: 59 | Admitting: Lab

## 2012-06-24 DIAGNOSIS — C50119 Malignant neoplasm of central portion of unspecified female breast: Secondary | ICD-10-CM

## 2012-06-24 DIAGNOSIS — C50912 Malignant neoplasm of unspecified site of left female breast: Secondary | ICD-10-CM

## 2012-06-24 LAB — CBC WITH DIFFERENTIAL/PLATELET
BASO%: 0.5 % (ref 0.0–2.0)
Basophils Absolute: 0 10*3/uL (ref 0.0–0.1)
EOS%: 1.3 % (ref 0.0–7.0)
Eosinophils Absolute: 0.1 10*3/uL (ref 0.0–0.5)
HCT: 32.4 % — ABNORMAL LOW (ref 34.8–46.6)
HGB: 10.7 g/dL — ABNORMAL LOW (ref 11.6–15.9)
LYMPH%: 23.8 % (ref 14.0–49.7)
MCH: 28.5 pg (ref 25.1–34.0)
MCHC: 32.9 g/dL (ref 31.5–36.0)
MCV: 86.6 fL (ref 79.5–101.0)
MONO#: 0.6 10*3/uL (ref 0.1–0.9)
MONO%: 9.9 % (ref 0.0–14.0)
NEUT#: 3.6 10*3/uL (ref 1.5–6.5)
NEUT%: 64.5 % (ref 38.4–76.8)
Platelets: 214 10*3/uL (ref 145–400)
RBC: 3.74 10*6/uL (ref 3.70–5.45)
RDW: 15.5 % — ABNORMAL HIGH (ref 11.2–14.5)
WBC: 5.6 10*3/uL (ref 3.9–10.3)
lymph#: 1.3 10*3/uL (ref 0.9–3.3)

## 2012-06-24 LAB — COMPREHENSIVE METABOLIC PANEL (CC13)
ALT: 11 U/L (ref 0–55)
AST: 13 U/L (ref 5–34)
Albumin: 3.2 g/dL — ABNORMAL LOW (ref 3.5–5.0)
Alkaline Phosphatase: 40 U/L (ref 40–150)
BUN: 14 mg/dL (ref 7.0–26.0)
CO2: 27 mEq/L (ref 22–29)
Calcium: 9.4 mg/dL (ref 8.4–10.4)
Chloride: 105 mEq/L (ref 98–107)
Creatinine: 0.8 mg/dL (ref 0.6–1.1)
Glucose: 98 mg/dl (ref 70–99)
Potassium: 3.7 mEq/L (ref 3.5–5.1)
Sodium: 141 mEq/L (ref 136–145)
Total Bilirubin: 0.4 mg/dL (ref 0.20–1.20)
Total Protein: 7.3 g/dL (ref 6.4–8.3)

## 2012-06-24 LAB — FOLLICLE STIMULATING HORMONE: FSH: 10.8 m[IU]/mL

## 2012-06-24 LAB — LUTEINIZING HORMONE: LH: 6.2 m[IU]/mL

## 2012-06-29 LAB — ESTRADIOL, ULTRA SENS: Estradiol, Ultra Sensitive: 15 pg/mL

## 2012-07-05 ENCOUNTER — Encounter: Payer: Self-pay | Admitting: Physician Assistant

## 2012-07-05 ENCOUNTER — Ambulatory Visit (HOSPITAL_BASED_OUTPATIENT_CLINIC_OR_DEPARTMENT_OTHER): Payer: 59 | Admitting: Physician Assistant

## 2012-07-05 ENCOUNTER — Telehealth: Payer: Self-pay | Admitting: *Deleted

## 2012-07-05 VITALS — BP 127/84 | HR 73 | Temp 98.1°F | Resp 20 | Ht 60.0 in | Wt 225.2 lb

## 2012-07-05 DIAGNOSIS — Z853 Personal history of malignant neoplasm of breast: Secondary | ICD-10-CM

## 2012-07-05 DIAGNOSIS — C50912 Malignant neoplasm of unspecified site of left female breast: Secondary | ICD-10-CM

## 2012-07-05 DIAGNOSIS — Z170 Estrogen receptor positive status [ER+]: Secondary | ICD-10-CM

## 2012-07-05 DIAGNOSIS — C50119 Malignant neoplasm of central portion of unspecified female breast: Secondary | ICD-10-CM

## 2012-07-05 NOTE — Progress Notes (Signed)
ID: [name]   DOB: 1956-06-22  MR#: [id number]  WUJ#:[id number]  HISTORY OF PRESENT ILLNESS: The patient had screening mammography in January 16, 2011 (she missed her mammogram on January 05, 2010) and this showed some changes in the left breast, which measured 1.1 cm.  Left breast ultrasound was obtained the same day and confirmed a small hypoechoic lobular area of nodularity measuring again 1.1 cm.  There was no other abnormality and the patient was set up for ultrasound-guided needle biopsy January 22, 2011.  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  Tumor was estrogen receptor positive at 82%, progesterone receptor positive at 92% with MIB-1 of 17% and no evidence of HER2 amplification, the ratio by CISH being 0.97.  With this information the patient was set up for bilateral breast MRI s and these were obtained January 29, 2011.  In the central left breast in the retroareolar location there was a spiculated enhancing mass with a regular margins measuring again 1.1 cm.  There were no other areas of abnormality.  She had her left lumpectomy and sentinel lymph node sampling on February 10, 2011.  The pathology report ([id number]) shows an 8-mm invasive ductal carcinoma, grade 1, involving 1/2 sentinel lymph nodes with a micrometastatic deposit.  The inferior margin was apparently broadly positive initially, but may have been cleared with subsequent excision in the same procedure.  The pathology report does not commit, but says the margin has been "likely cleared."   Her Oncotype DX gave her a recurrence score of 21%, predicting a risk of recurrent for node positive patients in the 12% range.  She decided to forego chemotherapy, but proceded with radiation therapy. She completed radiation in August 2012, and started on tamoxifen the first week of September 2012. Her subsequent history is as detailed below.  INTERVAL HISTORY: [name]  returns today for routine three-month followup of her left breast cancer. She continues on tamoxifen daily which she is tolerating well.  Interval history is remarkable for several appointments with her surgeon regarding a pilonidal cyst.  In fact, it sounds like that has been [name]'s biggest problem over the last few months.   It is uncomfortable and bothersome. They have recommended surgical removal, but she is "not ready for more surgery" at this time.   Review of Systems: Otherwise, [name] has had no recent illnesses and denies fevers, chills, or night sweats. She has no significant hot flashes. No vaginal dryness, discharge, or abnormal bleeding. She has not had a menstrual cycle since April 2013. (Recent labs show her not to be postmenopausal.) And she has chronic joint pain, especially with the left knee, and tells me she needs a knee revision on the left. She is followed by Dr. Thurston Hole. She's eating and drinking well, with no nausea or change in bowel habits. No increased cough, phlegm production, or increased shortness of breath. She is a little deconditioned and has some shortness of breath with exertion. No chest pain or palpitations. No abnormal headaches, change in vision, or dizziness. No unusual peripheral swelling.  A detailed review of systems is otherwise noncontributory.  PAST MEDICAL HISTORY: Past Medical History  Diagnosis Date  . HYPERLIPIDEMIA 05/11/2008  . OBSTRUCTIVE SLEEP APNEA 05/11/2008  . RASH-NONVESICULAR 06/29/2008  . COLONIC POLYPS, HX OF 05/11/2008  . GENITAL HERPES, HX OF 08/08/2009  . Inverted nipple   . Menorrhagia   . 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 LLQ pain   .  Hernia   . Low iron   . H/O gonorrhea   . Bacterial infection   . Trichomonas   . H/O irritable bowel syndrome   . Breast cancer     2012  . Hypertension   . Arthritis   . Abscess of buttock   Is significant for hypertension, obesity, early glaucoma, removal of a right breast cysts in  1972, total left knee replacement in 2007, possible irritable bowel syndrome, history of fibroid uterus surgery in 2006, history of C-section in 1981 and history of remote repair of the right and left Achilles tendons.  PAST SURGICAL HISTORY: Past Surgical History  Procedure Date  . Cesarean section   . Right achilles tendon     and left  . S/p ear surgury   . Right ovarian cyst     hx  . S/p extra uterine fibroid 2006  . S/p left knee replacement 2007  . Dilation and curettage of uterus   . Breast cyst excision 1973  . Uterine fibroid surgery 2006    FAMILY HISTORY Family History  Problem Relation Age of Onset  . Diabetes Mother   . Hypertension Mother   . Stroke Mother   . Heart disease Father     COPD  . Alcohol abuse Father     ETOH dependence  . Colon cancer Neg Hx   The patient's mother is alive..  The patient's father died in his early 64s from congestive heart failure.  The patient had five brothers and three sisters; most of theses siblings are half siblings, but in any case there is no history of breast or ovarian cancer in the immediate family.  There was one maternal aunt (out of a total of four) diagnosed with breast cancer in her 72s.  GYNECOLOGIC HISTORY: The patient had menarche age 67, she is still having regular periods.  She is Gx, P1, first pregnancy to term at age 70.  She stopped having menstrual periods August of 2012, but had one anomalous. In April of 2013.  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  She has been divorced more than 10 years and lives by herself at home with no pets.  Her one child, a son, died at age 4.   ADVANCED DIRECTIVES: not in place  HEALTH MAINTENANCE: History  Substance Use Topics  . Smoking status: Never Smoker   . Smokeless tobacco: Never Used  . Alcohol Use: Yes     occasionally drinks wine     Colonoscopy:  PAP:  Bone density:  Lipid panel:  Allergies  Allergen Reactions  . Codeine    . Cymbalta (Duloxetine Hcl) Other (See Comments)    Head felt funny, ? thinking not right  . Darvon   . Propoxyphene Hcl     Current Outpatient Prescriptions  Medication Sig Dispense Refill  . cholecalciferol (VITAMIN D) 400 UNITS TABS Take 1,000 Units by mouth daily.      . clindamycin (CLEOCIN) 300 MG capsule 4 times daily.      . cyclobenzaprine (FLEXERIL) 10 MG tablet Take 10 mg by mouth 3 (three) times daily as needed.      . Ferrous Sulfate (IRON) 325 (65 FE) MG TABS Take by mouth daily.        . fish oil-omega-3 fatty acids 1000 MG capsule Take 1 g by mouth daily.      . hyoscyamine (LEVSIN SL) 0.125 MG SL tablet Place 0.25 mg under the tongue every 4 (  four) hours as needed.      [name] ibuprofen (ADVIL,MOTRIN) 800 MG tablet Take 800 mg by mouth 3 (three) times daily.      [name] MYRBETRIQ 25 MG TB24 daily.      [name] nystatin cream (MYCOSTATIN) Apply to affected area 4 times daily  30 g  2  . oxyCODONE-acetaminophen (PERCOCET/ROXICET) 5-325 MG per tablet Ad lib.      . tamoxifen (NOLVADEX) 20 MG tablet Take 1 tablet (20 mg total) by mouth daily.  90 tablet  1  . telmisartan-hydrochlorothiazide (MICARDIS HCT) 80-25 MG per tablet Take 1 tablet by mouth daily.  90 tablet  3  . topical emolient (BIAFINE) foam Ad lib.      . valACYclovir (VALTREX) 1000 MG tablet Take 1 tablet (1,000 mg total) by mouth daily.  90 tablet  3  . vitamin C (ASCORBIC ACID) 500 MG tablet Take 500 mg by mouth daily.      [name] DISCONTD: tolterodine (DETROL LA) 4 MG 24 hr capsule Take 4 mg by mouth daily.        OBJECTIVE: Middle-aged [place] American woman in no acute distress Filed Vitals:   07/05/12 1336  BP: 127/84  Pulse: 73  Temp: 98.1 F (36.7 C)  Resp: 20     Body mass index is 43.98 kg/(m^2).    ECOG FS: 1 Filed Weights   07/05/12 1336  Weight: 225 lb 3.2 oz (102.15 kg)   Sclerae unicteric Oropharynx clear No cervical or supraclavicular adenopathy Lungs no rales or rhonchi Heart regular rate and  rhythm Abd benign, soft, nontender; positive bowel sounds MSK no focal spinal tenderness, no peripheral edema Neuro: nonfocal, alert and oriented x3 Breasts: The right breast is unremarkable. The left breast is status post lumpectomy and radiation. There is some residual hyperpigmentation. There is mild tenderness to palpation. No suspicious masses or skin changes. There is no evidence of disease recurrence. Axillae are benign bilaterally, with no adenopathy   LAB RESULTS: Lab Results  Component Value Date   WBC 5.6 06/24/2012   NEUTROABS 3.6 06/24/2012   HGB 10.7* 06/24/2012   HCT 32.4* 06/24/2012   MCV 86.6 06/24/2012   PLT 214 06/24/2012      Chemistry      Component Value Date/Time   NA 141 06/24/2012 0847   NA 141 03/28/2012 1050   K 3.7 06/24/2012 0847   K 4.1 03/28/2012 1050   CL 105 06/24/2012 0847   CL 106 03/28/2012 1050   CO2 27 06/24/2012 0847   CO2 25 03/28/2012 1050   BUN 14.0 06/24/2012 0847   BUN 15 03/28/2012 1050   CREATININE 0.8 06/24/2012 0847   CREATININE 0.72 03/28/2012 1050      Component Value Date/Time   CALCIUM 9.4 06/24/2012 0847   CALCIUM 9.2 03/28/2012 1050   ALKPHOS 40 06/24/2012 0847   ALKPHOS 32* 03/28/2012 1050   AST 13 06/24/2012 0847   AST 14 03/28/2012 1050   ALT 11 06/24/2012 0847   ALT 10 03/28/2012 1050   BILITOT 0.40 06/24/2012 0847   BILITOT 0.3 03/28/2012 1050       Lab Results  Component Value Date   LABCA2 19 03/28/2012     STUDIES: Right breast US at [facility] 03/30/2012 showed the preiously noted lymph node to be smaller and clearly benign morphologically   ASSESSMENT: 56 y.o.  [place] woman   (1)  status post left lumpectomy and sentinel lymph node dissection April of 2012 for a T1b N1(mic) stage  II invasive ductal carcinoma, grade 1, estrogen receptor 82% and progesterone receptor 92% positive, with no HER-2 amplification, and an MIB-1-1 of 17%,   (2)  The patient's Oncotype DX score of 21 predicts a 13% risk of distant recurrence after 5 years of  tamoxifen.  (3)  status post radiation completed August of 2012,   (4)  on tamoxifen since September of 2012 with good tolerance.   PLAN: With regards to her breast cancer, Jazalyn appears to be doing well, with no clinical evidence of disease recurrence. She'll continue on tamoxifen at 20 mg daily. She'll be due for her next visit here in 3 months, and is due for her next mammogram in March of 2014. She will call us with any changes or problems prior to her next appointment.  I encouraged Dorine to exercise more frequently, and of course she will continue to be followed by her surgeon for her pilonidal cyst.  The plan is to continue tamoxifen until she is clearly postmenopausal, at which point we may consider switching her to an aromatase inhibitor    Woodward Klem    07/05/2012

## 2012-07-05 NOTE — Telephone Encounter (Signed)
01-02-2013 at 8:00am at [facility]

## 2012-07-06 ENCOUNTER — Telehealth: Payer: Self-pay | Admitting: *Deleted

## 2012-07-06 NOTE — Telephone Encounter (Signed)
Gave patient appointment for 10-24-2012 lab only  11-01-2012 md appointment  Made patient appointment for mammogram at [facility]

## 2012-07-13 ENCOUNTER — Encounter (INDEPENDENT_AMBULATORY_CARE_PROVIDER_SITE_OTHER): Payer: 59 | Admitting: General Surgery

## 2012-07-14 ENCOUNTER — Encounter: Payer: Self-pay | Admitting: Radiation Oncology

## 2012-07-14 ENCOUNTER — Ambulatory Visit
Admission: RE | Admit: 2012-07-14 | Discharge: 2012-07-14 | Disposition: A | Discharge status: Home / Self Care | Payer: 59 | Source: Ambulatory Visit | Attending: Radiation Oncology | Admitting: Radiation Oncology

## 2012-07-14 VITALS — BP 126/83 | HR 80 | Temp 98.3°F | Resp 18 | Wt 224.2 lb

## 2012-07-14 DIAGNOSIS — C50912 Malignant neoplasm of unspecified site of left female breast: Secondary | ICD-10-CM

## 2012-07-14 NOTE — Progress Notes (Signed)
Patient presented to the clinic today unaccompanied for follow up appointment with Dr. Michell Heinrich. Patient is alert and oriented to person, place, and time. No distress noted. Steady gait noted. Pleasant affect noted. Patient denies pain at this time. However, patient reports her left breast is often sore and warm to the touch. Patient denies edema or nipple discharge of the left breast. Patient reports that the invertion of her nipple had gotten better but now it is completely inverted again. Reported all findings to Dr. Michell Heinrich.

## 2012-07-15 NOTE — Progress Notes (Signed)
1 year Department of Radiation Oncology  Phone:  [phone] Fax:        [phone]   Name: [name]   DOB: 05-12-1956  MRN: [id number]    Date: 07/15/2012  Follow Up Visit Note  Diagnosis: T1cN0 Invasive Ductal Carcinoma of the left breast  Interval since last radiation: 1 year  Interval History: [name] presents today for routine followup.  He is feeling well and doing well. She continues to have occasional pain in her left breast. She feels as though it sore in warm to the touch. She is also concerned that her nipple is inverted. He is pleased with her skin in her cosmetic result overall. She has enrolled in our finding her new normal class. She is tolerating her tamoxifen well. She had negative mammograms in March of this year.  Allergies:  Allergies  Allergen Reactions  . Codeine   . Cymbalta (Duloxetine Hcl) Other (See Comments)    Head felt funny, ? thinking not right  . Darvon   . Propoxyphene Hcl     Medications:  Current Outpatient Prescriptions  Medication Sig Dispense Refill  . cholecalciferol (VITAMIN D) 400 UNITS TABS Take 1,000 Units by mouth daily.      . tamoxifen (NOLVADEX) 20 MG tablet Take 1 tablet (20 mg total) by mouth daily.  90 tablet  1  . telmisartan-hydrochlorothiazide (MICARDIS HCT) 80-25 MG per tablet Take 1 tablet by mouth daily.  90 tablet  3  . clindamycin (CLEOCIN) 300 MG capsule 4 times daily.      . cyclobenzaprine (FLEXERIL) 10 MG tablet Take 10 mg by mouth 3 (three) times daily as needed.      . Ferrous Sulfate (IRON) 325 (65 FE) MG TABS Take by mouth daily.        . fish oil-omega-3 fatty acids 1000 MG capsule Take 1 g by mouth daily.      . hyoscyamine (LEVSIN SL) 0.125 MG SL tablet Place 0.25 mg under the tongue every 4 (four) hours as needed.      [name] ibuprofen (ADVIL,MOTRIN) 800 MG tablet Take 800 mg by mouth 3 (three) times daily.      [name] MYRBETRIQ 25 MG TB24 daily.      [name] nystatin cream (MYCOSTATIN) Apply to affected  area 4 times daily  30 g  2  . oxyCODONE-acetaminophen (PERCOCET/ROXICET) 5-325 MG per tablet Ad lib.      [name] topical emolient (BIAFINE) foam Ad lib.      . valACYclovir (VALTREX) 1000 MG tablet Take 1 tablet (1,000 mg total) by mouth daily.  90 tablet  3  . vitamin C (ASCORBIC ACID) 500 MG tablet Take 500 mg by mouth daily.      [name] DISCONTD: tolterodine (DETROL LA) 4 MG 24 hr capsule Take 4 mg by mouth daily.        Physical Exam:   weight is 224 lb 3.2 oz (101.696 kg). Her oral temperature is 98.3 F (36.8 C). Her blood pressure is 126/83 and her pulse is 80. Her respiration is 18.  She is an obese female in no distress sitting comfortably examining table. She really has very minimal skin darkening of the left breast. In the retroareolar area there is a lumpectomy cavity. Her scar is well hidden at the edge of her area left. There is no palpable abnormalities. No palpable abnormalities the rest of the left breast. No palpable abnormalities of the right breast. She has no palpable axillary or supraclavicular adenopathy.  IMPRESSION: [name] is a 56 y.o. female status post breast conservation for an early stage II invasive ductal carcinoma now on tamoxifen with no evidence of disease  PLAN:  Will looks great. We discussed methods of weight loss. We discussed survivorship. We discussed discussing her diagnosis with a new partner. I think her nipple inversion is normal and is just due to the absence of fluid under surgical defect. I released her from followup with me. She is regular scheduled followup with Dr. Daneil Dolin not and her surgeon. Encouraged her to contact me with any questions or concerns.    Lurline Hare, MD

## 2012-08-26 ENCOUNTER — Ambulatory Visit (INDEPENDENT_AMBULATORY_CARE_PROVIDER_SITE_OTHER): Payer: 59 | Admitting: General Surgery

## 2012-08-26 VITALS — BP 130/86 | HR 72 | Temp 97.6°F | Resp 16 | Ht 59.0 in | Wt 232.6 lb

## 2012-08-26 DIAGNOSIS — L988 Other specified disorders of the skin and subcutaneous tissue: Secondary | ICD-10-CM

## 2012-08-26 DIAGNOSIS — IMO0001 Reserved for inherently not codable concepts without codable children: Secondary | ICD-10-CM

## 2012-08-26 DIAGNOSIS — R1032 Left lower quadrant pain: Secondary | ICD-10-CM

## 2012-08-26 NOTE — Progress Notes (Signed)
Chief complaint: #1 sacral wound #2 left groin pain history:  History: Patient returns to the office today for a couple of issues. 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 Please see my previous dictation and evaluation for that history. She has been using Desitin to try to keep the skin dry but states the wound is still about the same by her estimation.  She also today as a new problem for me which is persistent left groin pain. She has a history of inguinal hernia repair which apparently was done transabdominally by Dr. Lurene Shadow about 2007 at the time of surgery for uterine fibroids. She states she has had some persistent left groin pain. This has gotten quite a bit worse in the last couple of months. It is related to activity. She describes aching pain in the inguinal area but occasionally will get quite severe. She has not noted a lump or bulge.  Exam: BP 130/86  Pulse 72  Temp 97.6 F (36.4 C) (Temporal)  Resp 16  Ht 4\' 11"  (1.499 m)  Wt 232 lb 9.6 oz (105.507 kg)  BMI 46.98 kg/m2 General: Obese otherwise well-appearing female Skin: Again noted over the gluteal cleft is a linear open wound but it is somewhat improved from last visit in that there is some bridging skin across a couple of areas converting this to 3 linear wounds about a centimeter to a centimeter in length in a couple of millimeters wide without any unusual drainage. Abdomen: Obese. There is some chronic skin thickening changes at the umbilicus which is also concerned about but no feel any hernia here. There is tenderness over the left inguinal area but with coughing and straining I can feel no evidence of recurrent hernia.  Assessment and plan: #1: Chronic sacrococcygeal wound. This is a little bit improved with barrier treatment but still a problem for her. I had previously discussed excision and wound packing as a treatment she is very concerned  about this obviously with prolonged wound healing. I wonder if possibly a flap to eliminate the gluteal cleft mitral possibility of going to ask Dr. Odis Luster to take a look at the stomach plastic surgery perspective before making any further plans.  #2 left groin pain which is chronic with history of left inguinal hernia repair apparently done transabdominally at the time of GYN surgery although I do not have that operative report. I do have a copy of an MRI for 2006 indicating a left inguinal hernia. I cannot feel a hernia. This could also be GYN pathology or arthritis of her hip her back. Going to go ahead and get a CT scan of her abdomen and pelvis to evaluate for recurrent hernia or other pathology.  She will return in one month following these evaluations. Continue current wound care for now.

## 2012-08-29 ENCOUNTER — Other Ambulatory Visit: Payer: Self-pay | Admitting: Nurse Practitioner

## 2012-08-29 DIAGNOSIS — C50119 Malignant neoplasm of central portion of unspecified female breast: Secondary | ICD-10-CM

## 2012-08-29 MED ORDER — TAMOXIFEN CITRATE 20 MG PO TABS: 20.0000 mg | ORAL_TABLET | Freq: Every day | ORAL | Status: DC

## 2012-08-31 ENCOUNTER — Ambulatory Visit
Admission: RE | Admit: 2012-08-31 | Discharge: 2012-08-31 | Disposition: A | Discharge status: Home / Self Care | Payer: 59 | Source: Ambulatory Visit | Attending: General Surgery | Admitting: General Surgery

## 2012-08-31 DIAGNOSIS — L988 Other specified disorders of the skin and subcutaneous tissue: Secondary | ICD-10-CM

## 2012-08-31 DIAGNOSIS — IMO0001 Reserved for inherently not codable concepts without codable children: Secondary | ICD-10-CM

## 2012-08-31 MED ORDER — IOHEXOL 300 MG/ML  SOLN
125.0000 mL | Freq: Once | INTRAMUSCULAR | Status: AC | PRN
  Administered 2012-08-31: 125 mL via INTRAVENOUS

## 2012-09-13 ENCOUNTER — Telehealth (INDEPENDENT_AMBULATORY_CARE_PROVIDER_SITE_OTHER): Payer: Self-pay

## 2012-09-13 NOTE — Telephone Encounter (Signed)
Called patient to schedule follow up appointment to discuss CT results.  Patient scheduled for Friday Decem,ber 27, 2013 @ 9:30 am w/Dr. Johna Sheriff.  Patient would like earlier appointment if possible.  Patient made aware that we will call her if there's a cancellation prior to 10/14/12 w/Dr. Johna Sheriff.  Also, patient request not to be called at work and to only call her on [phone].

## 2012-09-14 ENCOUNTER — Other Ambulatory Visit (INDEPENDENT_AMBULATORY_CARE_PROVIDER_SITE_OTHER): Payer: Self-pay | Admitting: General Surgery

## 2012-09-14 ENCOUNTER — Telehealth (INDEPENDENT_AMBULATORY_CARE_PROVIDER_SITE_OTHER): Payer: Self-pay

## 2012-09-14 DIAGNOSIS — L988 Other specified disorders of the skin and subcutaneous tissue: Secondary | ICD-10-CM

## 2012-09-14 NOTE — Telephone Encounter (Signed)
Dr. Odis Luster office called requesting information on this patient.  After reading Dr. Jamse Mead last office note I faxed notes and a referral for PS consult.  The patient is aware.  Angie from Dr. Maxcine Ham office will contact the patient next week.

## 2012-10-05 ENCOUNTER — Emergency Department (HOSPITAL_COMMUNITY)
Admission: EM | Admit: 2012-10-05 | Discharge: 2012-10-05 | Disposition: A | Discharge status: Home / Self Care | Payer: 59 | Source: Home / Self Care

## 2012-10-05 ENCOUNTER — Encounter (HOSPITAL_COMMUNITY): Payer: Self-pay | Admitting: *Deleted

## 2012-10-05 DIAGNOSIS — L0390 Cellulitis, unspecified: Secondary | ICD-10-CM

## 2012-10-05 DIAGNOSIS — L0291 Cutaneous abscess, unspecified: Secondary | ICD-10-CM

## 2012-10-05 MED ORDER — SULFAMETHOXAZOLE-TRIMETHOPRIM 800-160 MG PO TABS: 1.0000 | ORAL_TABLET | Freq: Two times a day (BID) | ORAL | Status: DC

## 2012-10-05 MED ORDER — HYDROCODONE-ACETAMINOPHEN 5-325 MG PO TABS: 1.0000 | ORAL_TABLET | ORAL | Status: DC | PRN

## 2012-10-05 NOTE — ED Notes (Signed)
C/O abscess to right inner thigh at crease of buttock x 1 wk with progressive worsening.  Has been applying heating pad.  Unsure if any fevers.

## 2012-10-05 NOTE — ED Provider Notes (Signed)
History     CSN: [id number]  Arrival date & time 10/05/12  1758   None     Chief Complaint  Patient presents with  . Abscess    (Consider location/radiation/quality/duration/timing/severity/associated sxs/prior treatment) HPI Comments: Developed a tender sore area of induration on the right inner thigh at the flexor crease approximately one week ago. Has been increasing in size and tenderness since that time. There is an area of induration approximately 3 x 4 cm. There is no external opening or evidence of drainage. It is not affected the genitalia or rectum. It is not extend beyond the flexor crease of the proximal thigh.   Past Medical History  Diagnosis Date  . HYPERLIPIDEMIA 05/11/2008  . OBSTRUCTIVE SLEEP APNEA 05/11/2008  . RASH-NONVESICULAR 06/29/2008  . COLONIC POLYPS, HX OF 05/11/2008  . GENITAL HERPES, HX OF 08/08/2009  . Inverted nipple   . Menorrhagia   . 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 LLQ pain   . Hernia   . Low iron   . H/O gonorrhea   . Bacterial infection   . Trichomonas   . H/O irritable bowel syndrome   . Breast cancer     2012  . Hypertension   . Arthritis   . Abscess of buttock     Past Surgical History  Procedure Date  . Cesarean section   . Right achilles tendon     and left  . S/p ear surgury   . Right ovarian cyst     hx  . S/p extra uterine fibroid 2006  . S/p left knee replacement 2007  . Dilation and curettage of uterus   . Uterine fibroid surgery 2006  . Breast cyst excision 1973  . Breast lumpectomy   . Axillary lymph node dissection     Family History  Problem Relation Age of Onset  . Diabetes Mother   . Hypertension Mother   . Stroke Mother   . Heart disease Father     COPD  . Alcohol abuse Father     ETOH dependence  . Colon cancer Neg Hx     History  Substance Use Topics  . Smoking status: Never Smoker   . Smokeless tobacco: Never Used  . Alcohol Use: Yes     Comment: occasionally drinks wine    OB  History    Grav Para Term Preterm Abortions TAB SAB Ect Mult Living                  Review of Systems  All other systems reviewed and are negative.    Allergies  Codeine; Cymbalta; Darvon; and Propoxyphene hcl  Home Medications   Current Outpatient Rx  Name  Route  Sig  Dispense  Refill  . IRON 325 (65 FE) MG PO TABS   Oral   Take by mouth daily.           . IBUPROFEN 800 MG PO TABS   Oral   Take 800 mg by mouth 3 (three) times daily.         [name] ONE-DAILY MULTI VITAMINS PO TABS   Oral   Take 1 tablet by mouth daily.         [name] TAMOXIFEN CITRATE 20 MG PO TABS   Oral   Take 1 tablet (20 mg total) by mouth daily.   90 tablet   1   . TELMISARTAN-HCTZ 80-25 MG PO TABS   Oral   Take 1 tablet by mouth daily.  90 tablet   3   . VALACYCLOVIR HCL 1 G PO TABS   Oral   Take 1 tablet (1,000 mg total) by mouth daily.   90 tablet   3   . CHOLECALCIFEROL 400 UNITS PO TABS   Oral   Take 1,000 Units by mouth daily.         [name] CLINDAMYCIN HCL 300 MG PO CAPS      4 times daily.         . OMEGA-3 FATTY ACIDS 1000 MG PO CAPS   Oral   Take 1 g by mouth daily.         [name] HYDROCODONE-ACETAMINOPHEN 5-325 MG PO TABS   Oral   Take 1 tablet by mouth every 4 (four) hours as needed for pain.   15 tablet   0   . HYOSCYAMINE SULFATE 0.125 MG SL SUBL   Sublingual   Place 0.25 mg under the tongue every 4 (four) hours as needed.         [name] MYRBETRIQ 25 MG PO TB24      daily.         . NYSTATIN 100000 UNIT/GM EX CREA      Apply to affected area 4 times daily   30 g   2   . SULFAMETHOXAZOLE-TRIMETHOPRIM 800-160 MG PO TABS   Oral   Take 1 tablet by mouth 2 (two) times daily. X 7 days   14 tablet   0   . VITAMIN C 500 MG PO TABS   Oral   Take 500 mg by mouth daily.           BP 132/85  Pulse 78  Temp 98.5 F (36.9 C) (Oral)  Resp 18  SpO2 100%  Physical Exam  Constitutional: She is oriented to person, place, and time. She appears  well-developed and well-nourished. No distress.  Pulmonary/Chest: Effort normal.  Neurological: She is alert and oriented to person, place, and time.  Skin: Skin is warm and dry.       The history of present illness. 3 x 4 cm area of induration in the right proximal inner thigh external to the flexor crease. There is tenderness but no erythema or lymphangitis.  Psychiatric: She has a normal mood and affect.    ED Course  INCISION AND DRAINAGE Performed by: Phineas Real, Caius Silbernagel Authorized by: Phineas Real, Romaldo Saville Consent: Verbal consent obtained. Risks and benefits: risks, benefits and alternatives were discussed Consent given by: patient Patient understanding: patient states understanding of the procedure being performed Type: abscess Body area: upper extremity Anesthesia: local infiltration Local anesthetic: lidocaine 2% with epinephrine Anesthetic total: 5 ml Scalpel size: 11 Incision type: single straight Complexity: simple Drainage: purulent and bloody Drainage amount: moderate Wound treatment: drain placed Packing material: 1/4 in gauze Patient tolerance: Patient tolerated the procedure well with no immediate complications.   (including critical care time)   Labs Reviewed  CULTURE, ROUTINE-ABSCESS   No results found.   1. Cellulitis and abscess       MDM  Incision and drainage of the abscess Packing has documented above Keep the wound clean and dry Septra DS one twice a day for 7 day Norco 5 mg one every 4 hours when necessary pain Return to the office in 2 days for wound check and packing removal. Any worsening new symptoms problems return sooner.         Hayden Rasmussen, NP 10/05/12 1946

## 2012-10-07 ENCOUNTER — Emergency Department (INDEPENDENT_AMBULATORY_CARE_PROVIDER_SITE_OTHER)
Admission: EM | Admit: 2012-10-07 | Discharge: 2012-10-07 | Disposition: A | Discharge status: Home / Self Care | Payer: 59 | Source: Home / Self Care

## 2012-10-07 ENCOUNTER — Encounter (HOSPITAL_COMMUNITY): Payer: Self-pay | Admitting: Emergency Medicine

## 2012-10-07 DIAGNOSIS — L0291 Cutaneous abscess, unspecified: Secondary | ICD-10-CM

## 2012-10-07 NOTE — ED Provider Notes (Signed)
Medical screening examination/treatment/procedure(s) were performed by resident physician or non-physician practitioner and as supervising physician I was immediately available for consultation/collaboration.   Irving Lubbers DOUGLAS MD.    Ottie Tillery D Haevyn Ury, MD 10/07/12 1129 

## 2012-10-07 NOTE — ED Provider Notes (Signed)
History     CSN: [id number]  Arrival date & time 10/07/12  1633   First MD Initiated Contact with Patient 10/07/12 1740      Chief Complaint  Patient presents with  . Follow-up    (Consider location/radiation/quality/duration/timing/severity/associated sxs/prior treatment) HPI Comments: This 56 year old female was here 2 days ago with an abscess on her right proximal inner thigh. Treatment was incision and drainage followed by packing of iodoform gauze. She states she has had some improvement but that area is still sore. There was initial drainage for the first day and a half and the dressing would not state. She had changed several times during the day. She presents with a dressing over the wound and the packing remains. The packing was removed there was pain and purulent material on the packing but no exudates can be expressed from the wound. There is induration is a little smaller but is still there. There is no erythema or lymphangitis. She is taking her antibiotics as directed.   Past Medical History  Diagnosis Date  . HYPERLIPIDEMIA 05/11/2008  . OBSTRUCTIVE SLEEP APNEA 05/11/2008  . RASH-NONVESICULAR 06/29/2008  . COLONIC POLYPS, HX OF 05/11/2008  . GENITAL HERPES, HX OF 08/08/2009  . Inverted nipple   . Menorrhagia   . 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 LLQ pain   . Hernia   . Low iron   . H/O gonorrhea   . Bacterial infection   . Trichomonas   . H/O irritable bowel syndrome   . Breast cancer     2012  . Hypertension   . Arthritis   . Abscess of buttock     Past Surgical History  Procedure Date  . Cesarean section   . Right achilles tendon     and left  . S/p ear surgury   . Right ovarian cyst     hx  . S/p extra uterine fibroid 2006  . S/p left knee replacement 2007  . Dilation and curettage of uterus   . Uterine fibroid surgery 2006  . Breast cyst excision 1973  . Breast lumpectomy   . Axillary lymph node dissection     Family History  Problem  Relation Age of Onset  . Diabetes Mother   . Hypertension Mother   . Stroke Mother   . Heart disease Father     COPD  . Alcohol abuse Father     ETOH dependence  . Colon cancer Neg Hx     History  Substance Use Topics  . Smoking status: Never Smoker   . Smokeless tobacco: Never Used  . Alcohol Use: Yes     Comment: occasionally drinks wine    OB History    Grav Para Term Preterm Abortions TAB SAB Ect Mult Living                  Review of Systems  All other systems reviewed and are negative.    Allergies  Codeine; Cymbalta; Darvon; and Propoxyphene hcl  Home Medications   Current Outpatient Rx  Name  Route  Sig  Dispense  Refill  . TAMOXIFEN CITRATE 20 MG PO TABS   Oral   Take 1 tablet (20 mg total) by mouth daily.   90 tablet   1   . CHOLECALCIFEROL 400 UNITS PO TABS   Oral   Take 1,000 Units by mouth daily.         [name] CLINDAMYCIN HCL 300 MG PO CAPS  4 times daily.         . IRON 325 (65 FE) MG PO TABS   Oral   Take by mouth daily.           . OMEGA-3 FATTY ACIDS 1000 MG PO CAPS   Oral   Take 1 g by mouth daily.         [name] HYDROCODONE-ACETAMINOPHEN 5-325 MG PO TABS   Oral   Take 1 tablet by mouth every 4 (four) hours as needed for pain.   15 tablet   0   . HYOSCYAMINE SULFATE 0.125 MG SL SUBL   Sublingual   Place 0.25 mg under the tongue every 4 (four) hours as needed.         . IBUPROFEN 800 MG PO TABS   Oral   Take 800 mg by mouth 3 (three) times daily.         [name] ONE-DAILY MULTI VITAMINS PO TABS   Oral   Take 1 tablet by mouth daily.         [name] MYRBETRIQ 25 MG PO TB24      daily.         . NYSTATIN 100000 UNIT/GM EX CREA      Apply to affected area 4 times daily   30 g   2   . SULFAMETHOXAZOLE-TRIMETHOPRIM 800-160 MG PO TABS   Oral   Take 1 tablet by mouth 2 (two) times daily. X 7 days   14 tablet   0   . TELMISARTAN-HCTZ 80-25 MG PO TABS   Oral   Take 1 tablet by mouth daily.   90 tablet   3   .  VALACYCLOVIR HCL 1 G PO TABS   Oral   Take 1 tablet (1,000 mg total) by mouth daily.   90 tablet   3   . VITAMIN C 500 MG PO TABS   Oral   Take 500 mg by mouth daily.           BP 128/72  Pulse 66  Temp 98 F (36.7 C) (Oral)  Resp 16  SpO2 100%  Physical Exam  Constitutional: She is oriented to person, place, and time. She appears well-developed and well-nourished. No distress.  Pulmonary/Chest: Effort normal.  Neurological: She is alert and oriented to person, place, and time.  Skin: Skin is warm and dry.       History of present illness for description of the wound.    ED Course  Procedures (including critical care time)  Labs Reviewed - No data to display No results found.   1. Cellulitis and abscess       MDM  The wound is healing nicely, his only been 2 days. There is no more drainage and no exudates or other fluid can be expressed. Continue to apply warm compresses and finish her antibiotics. Any worsening or new symptoms or problems or not healing in the next 3-5 days may return.         Hayden Rasmussen, NP 10/07/12 [phone]

## 2012-10-07 NOTE — ED Provider Notes (Signed)
Medical screening examination/treatment/procedure(s) were performed by resident physician or non-physician practitioner and as supervising physician I was immediately available for consultation/collaboration.   Barkley Bruns MD.    Linna Hoff, MD 10/07/12 2000

## 2012-10-07 NOTE — ED Notes (Signed)
Pt is here for a f/u of abscess on right inner thigh... Sx have somewhat improved but still having some discomfort... She is alert w/no signs of acute distress.

## 2012-10-09 LAB — CULTURE, ROUTINE-ABSCESS

## 2012-10-14 ENCOUNTER — Encounter (INDEPENDENT_AMBULATORY_CARE_PROVIDER_SITE_OTHER): Payer: Self-pay | Admitting: General Surgery

## 2012-10-14 ENCOUNTER — Ambulatory Visit (INDEPENDENT_AMBULATORY_CARE_PROVIDER_SITE_OTHER): Payer: 59 | Admitting: General Surgery

## 2012-10-14 VITALS — BP 118/70 | HR 68 | Temp 98.2°F | Resp 12 | Ht 59.0 in | Wt 232.0 lb

## 2012-10-14 DIAGNOSIS — L988 Other specified disorders of the skin and subcutaneous tissue: Secondary | ICD-10-CM

## 2012-10-14 DIAGNOSIS — R911 Solitary pulmonary nodule: Secondary | ICD-10-CM

## 2012-10-14 DIAGNOSIS — K439 Ventral hernia without obstruction or gangrene: Secondary | ICD-10-CM

## 2012-10-14 DIAGNOSIS — L732 Hidradenitis suppurativa: Secondary | ICD-10-CM

## 2012-10-14 DIAGNOSIS — R222 Localized swelling, mass and lump, trunk: Secondary | ICD-10-CM

## 2012-10-14 NOTE — Progress Notes (Signed)
Chief complaint: Followup perineal wound and left lower quadrant pain, question hernia  History: Patient returns for followup. In regards to her chronic wound over her sacrococcygeal area she saw plastic surgery has requested but remarkably with the Desitin cream as a barrier this area has completely healed. Therefore no surgery obviously was necessary.  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 This is in the area where she has her intermittent pain but actually the pain has been a lot better lately and really not bothering her much at all.  Her CT did show a small pulmonary nodule in six-month followup was recommended. I discussed this with her.  She had an incision and drainage of a small area of infection her proximal right thigh which is still bothering her somewhat and she is on antibiotics. She asked me to check this.  Exam: BP 118/70  Pulse 68  Temp 98.2 F (36.8 C) (Oral)  Resp 12  Ht 4\' 11"  (1.499 m)  Wt 232 lb (105.235 kg)  BMI 46.86 kg/m2 Skin: The sacrococcygeal wound is essentially completely healed with about a 3 mm open area that came back after she stopped using the Desitin. She is now using again. This is a remarkable improvement. In the proximal right thigh is a previous I&D site of what looks like an area of hidradenitis with still about a 2 x 1 cm area of induration but not feel any fluctuance and there is no cellulitis. Abdomen: There is some tenderness in the left lower quadrant with her standing and straining which is in the area where you would see a spaghetti and hernia but also at the left end of her Pfannenstiel incision. I do not however feel a discrete bulge or mass.  Assessment and plan: #1 chronic sacrococcygeal wound: This has essentially completely healed with Desitin barrier and she is very happy about this and no further treatment required. She will continue the Desitin #2 left lower quadrant pain.  Possible small incisional or spigelian hernia. I cannot confirm this on exam. She is currently symptomatically improved. I would therefore not recommend surgical intervention. She will keep her on this and see me as needed for worsening symptoms #3: small pulmonary nodule seen on CT. Six-month followup was recommended and we will order this. #4: small area of hydradenitis right thigh status post I&D. She will complete her antibiotics and she'll call as needed that this would not continue to improve.

## 2012-10-14 NOTE — Patient Instructions (Signed)
Continue to use the Desitin cream  You will need a repeat CT of your chest in 6 months.  Call as needed for questions or concerns.

## 2012-10-24 ENCOUNTER — Other Ambulatory Visit (HOSPITAL_BASED_OUTPATIENT_CLINIC_OR_DEPARTMENT_OTHER): Payer: 59 | Admitting: Lab

## 2012-10-24 DIAGNOSIS — C50919 Malignant neoplasm of unspecified site of unspecified female breast: Secondary | ICD-10-CM

## 2012-10-24 DIAGNOSIS — C50912 Malignant neoplasm of unspecified site of left female breast: Secondary | ICD-10-CM

## 2012-10-24 LAB — CBC WITH DIFFERENTIAL/PLATELET
BASO%: 0.9 % (ref 0.0–2.0)
Basophils Absolute: 0 10*3/uL (ref 0.0–0.1)
EOS%: 1.5 % (ref 0.0–7.0)
Eosinophils Absolute: 0.1 10*3/uL (ref 0.0–0.5)
HCT: 32.4 % — ABNORMAL LOW (ref 34.8–46.6)
HGB: 10.8 g/dL — ABNORMAL LOW (ref 11.6–15.9)
LYMPH%: 27 % (ref 14.0–49.7)
MCH: 28.6 pg (ref 25.1–34.0)
MCHC: 33.4 g/dL (ref 31.5–36.0)
MCV: 85.4 fL (ref 79.5–101.0)
MONO#: 0.5 10*3/uL (ref 0.1–0.9)
MONO%: 9.1 % (ref 0.0–14.0)
NEUT#: 3.2 10*3/uL (ref 1.5–6.5)
NEUT%: 61.5 % (ref 38.4–76.8)
Platelets: 226 10*3/uL (ref 145–400)
RBC: 3.79 10*6/uL (ref 3.70–5.45)
RDW: 14.8 % — ABNORMAL HIGH (ref 11.2–14.5)
WBC: 5.1 10*3/uL (ref 3.9–10.3)
lymph#: 1.4 10*3/uL (ref 0.9–3.3)

## 2012-10-24 LAB — COMPREHENSIVE METABOLIC PANEL (CC13)
ALT: 10 U/L (ref 0–55)
AST: 15 U/L (ref 5–34)
Albumin: 2.9 g/dL — ABNORMAL LOW (ref 3.5–5.0)
Alkaline Phosphatase: 31 U/L — ABNORMAL LOW (ref 40–150)
BUN: 19 mg/dL (ref 7.0–26.0)
CO2: 24 mEq/L (ref 22–29)
Calcium: 9.3 mg/dL (ref 8.4–10.4)
Chloride: 107 mEq/L (ref 98–107)
Creatinine: 0.8 mg/dL (ref 0.6–1.1)
Glucose: 119 mg/dl — ABNORMAL HIGH (ref 70–99)
Potassium: 3.6 mEq/L (ref 3.5–5.1)
Sodium: 141 mEq/L (ref 136–145)
Total Bilirubin: 0.24 mg/dL (ref 0.20–1.20)
Total Protein: 7.3 g/dL (ref 6.4–8.3)

## 2012-10-24 LAB — CANCER ANTIGEN 27.29: CA 27.29: 19 U/mL (ref 0–39)

## 2012-11-01 ENCOUNTER — Encounter: Payer: Self-pay | Admitting: Physician Assistant

## 2012-11-01 ENCOUNTER — Telehealth: Payer: Self-pay | Admitting: Oncology

## 2012-11-01 ENCOUNTER — Ambulatory Visit (HOSPITAL_BASED_OUTPATIENT_CLINIC_OR_DEPARTMENT_OTHER): Payer: 59 | Admitting: Physician Assistant

## 2012-11-01 VITALS — BP 124/87 | HR 75 | Temp 98.4°F | Resp 20 | Ht 59.0 in | Wt 231.8 lb

## 2012-11-01 DIAGNOSIS — C50912 Malignant neoplasm of unspecified site of left female breast: Secondary | ICD-10-CM

## 2012-11-01 DIAGNOSIS — Z853 Personal history of malignant neoplasm of breast: Secondary | ICD-10-CM

## 2012-11-01 DIAGNOSIS — Z170 Estrogen receptor positive status [ER+]: Secondary | ICD-10-CM

## 2012-11-01 DIAGNOSIS — D509 Iron deficiency anemia, unspecified: Secondary | ICD-10-CM

## 2012-11-01 DIAGNOSIS — C50119 Malignant neoplasm of central portion of unspecified female breast: Secondary | ICD-10-CM

## 2012-11-01 DIAGNOSIS — R7301 Impaired fasting glucose: Secondary | ICD-10-CM

## 2012-11-01 NOTE — Telephone Encounter (Signed)
Per AB added lb for next wk also (1/20 - order in). gv pt appt schedule for January and April. S/w [facility] and pt already on schedule for 3/17 @ 8am. Pt made aware.

## 2012-11-01 NOTE — Progress Notes (Signed)
ID: [name]   DOB: 1956/06/25  MR#: [id number]  CSN#:[id number]  HISTORY OF PRESENT ILLNESS: The patient had screening mammography in January 16, 2011 (she missed her mammogram on January 05, 2010) and this showed some changes in the left breast, which measured 1.1 cm.  Left breast ultrasound was obtained the same day and confirmed a small hypoechoic lobular area of nodularity measuring again 1.1 cm.  There was no other abnormality and the patient was set up for ultrasound-guided needle biopsy January 22, 2011.  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  Tumor was estrogen receptor positive at 82%, progesterone receptor positive at 92% with MIB-1 of 17% and no evidence of HER2 amplification, the ratio by CISH being 0.97.  With this information the patient was set up for bilateral breast MRI s and these were obtained January 29, 2011.  In the central left breast in the retroareolar location there was a spiculated enhancing mass with a regular margins measuring again 1.1 cm.  There were no other areas of abnormality.  She had her left lumpectomy and sentinel lymph node sampling on February 10, 2011.  The pathology report ([id number]) shows an 8-mm invasive ductal carcinoma, grade 1, involving 1/2 sentinel lymph nodes with a micrometastatic deposit.  The inferior margin was apparently broadly positive initially, but may have been cleared with subsequent excision in the same procedure.  The pathology report does not commit, but says the margin has been "likely cleared."   Her Oncotype DX gave her a recurrence score of 21%, predicting a risk of recurrent for node positive patients in the 12% range.  She decided to forego chemotherapy, but proceded with radiation therapy. She completed radiation in August 2012, and started on tamoxifen the first week of September 2012. Her subsequent history is as detailed below.  INTERVAL HISTORY: [name]  returns today for routine three-month followup of her left breast cancer. She continues on tamoxifen daily. She's had increased joint pain and muscle pain, especially in the lower extremities, and wonders if this might be associated with the tamoxifen. She understands that this is not a common side effect of this drug.  Interval history is generally unremarkable.   Review of Systems: [name] has had no recent illnesses and denies fevers, chills, or night sweats. She has no significant hot flashes. No vaginal dryness, discharge, or abnormal bleeding. She has not had a menstrual cycle since April 2013. She continues to have chronic joint pain, especially with the left knee, and tells me she needs a knee revision on the left. She is followed by Dr. Thurston Hole. She's eating and drinking well, with no nausea or change in bowel habits. She is not exercising regularly, primarily due to the joint pain. She has had no increased cough or phlegm production. She is deconditioned and has some shortness of breath with exertion. No chest pain or palpitations. No abnormal headaches, change in vision, or dizziness. No unusual peripheral swelling.  A detailed review of systems is otherwise noncontributory.  PAST MEDICAL HISTORY: Past Medical History  Diagnosis Date  . HYPERLIPIDEMIA 05/11/2008  . OBSTRUCTIVE SLEEP APNEA 05/11/2008  . RASH-NONVESICULAR 06/29/2008  . COLONIC POLYPS, HX OF 05/11/2008  . GENITAL HERPES, HX OF 08/08/2009  . Inverted nipple   . Menorrhagia   . 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 LLQ pain   . Hernia   . Low iron   . H/O gonorrhea   . Bacterial infection   .  Trichomonas   . H/O irritable bowel syndrome   . Breast cancer     2012  . Hypertension   . Arthritis   . Abscess of buttock   Is significant for hypertension, obesity, early glaucoma, removal of a right breast cysts in 1972, total left knee replacement in 2007, possible irritable bowel syndrome, history of fibroid uterus  surgery in 2006, history of C-section in 1981 and history of remote repair of the right and left Achilles tendons.  PAST SURGICAL HISTORY: Past Surgical History  Procedure Date  . Cesarean section   . Right achilles tendon     and left  . S/p ear surgury   . Right ovarian cyst     hx  . S/p extra uterine fibroid 2006  . S/p left knee replacement 2007  . Dilation and curettage of uterus   . Uterine fibroid surgery 2006  . Breast cyst excision 1973  . Breast lumpectomy   . Axillary lymph node dissection     FAMILY HISTORY Family History  Problem Relation Age of Onset  . Diabetes Mother   . Hypertension Mother   . Stroke Mother   . Heart disease Father     COPD  . Alcohol abuse Father     ETOH dependence  . Colon cancer Neg Hx   The patient's mother is alive..  The patient's father died in his early 34s from congestive heart failure.  The patient had five brothers and three sisters; most of theses siblings are half siblings, but in any case there is no history of breast or ovarian cancer in the immediate family.  There was one maternal aunt (out of a total of four) diagnosed with breast cancer in her 7s.  GYNECOLOGIC HISTORY: The patient had menarche age 29, she is still having regular periods.  She is Gx, P1, first pregnancy to term at age 22.  She stopped having menstrual periods August of 2012, but had one anomalous. In April of 2013.  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  She has been divorced more than 10 years and lives by herself at home with no pets.  Her one child, a son, died at age 61.   ADVANCED DIRECTIVES: not in place  HEALTH MAINTENANCE: History  Substance Use Topics  . Smoking status: Never Smoker   . Smokeless tobacco: Never Used  . Alcohol Use: Yes     Comment: occasionally drinks wine     Colonoscopy:  PAP:  Bone density:  Lipid panel:  Allergies  Allergen Reactions  . Codeine   . Cymbalta (Duloxetine Hcl) Other  (See Comments)    Head felt funny, ? thinking not right  . Darvon   . Propoxyphene Hcl     Current Outpatient Prescriptions  Medication Sig Dispense Refill  . ibuprofen (ADVIL,MOTRIN) 800 MG tablet Take 800 mg by mouth 3 (three) times daily.      . tamoxifen (NOLVADEX) 20 MG tablet Take 1 tablet (20 mg total) by mouth daily.  90 tablet  1  . telmisartan-hydrochlorothiazide (MICARDIS HCT) 80-25 MG per tablet Take 1 tablet by mouth daily.  90 tablet  3  . cholecalciferol (VITAMIN D) 400 UNITS TABS Take 1,000 Units by mouth daily.      . Ferrous Sulfate (IRON) 325 (65 FE) MG TABS Take by mouth daily.        . fish oil-omega-3 fatty acids 1000 MG capsule Take 1 g by mouth daily.      [name]  hyoscyamine (LEVSIN SL) 0.125 MG SL tablet Place 0.25 mg under the tongue every 4 (four) hours as needed.      . Multiple Vitamin (MULTIVITAMIN) tablet Take 1 tablet by mouth daily.      [name] nystatin cream (MYCOSTATIN) Apply to affected area 4 times daily  30 g  2  . valACYclovir (VALTREX) 1000 MG tablet Take 1 tablet (1,000 mg total) by mouth daily.  90 tablet  3  . vitamin C (ASCORBIC ACID) 500 MG tablet Take 500 mg by mouth daily.      . [DISCONTINUED] tolterodine (DETROL LA) 4 MG 24 hr capsule Take 4 mg by mouth daily.        OBJECTIVE: Middle-aged [place] Young in no acute distress Filed Vitals:   11/01/12 1307  BP: 124/87  Pulse: 75  Temp: 98.4 F (36.9 C)  Resp: 20     Body mass index is 46.82 kg/(m^2).    ECOG FS: 1 Filed Weights   11/01/12 1307  Weight: 231 lb 12.8 oz (105.144 kg)   Sclerae unicteric Oropharynx clear No cervical or supraclavicular adenopathy Lungs no rales or rhonchi Heart regular rate and rhythm Abdomen  Obese, soft, nontender; positive bowel sounds MSK no focal spinal tenderness,  No peripheral edema Neuro: nonfocal, alert and oriented x3 Breasts: The right breast is unremarkable. The left breast is status post lumpectomy and radiation. There is some residual  hyperpigmentation and tenderness to palpation. There is no evidence of disease recurrence. Axillae are benign bilaterally, with no adenopathy   LAB RESULTS: Lab Results  Component Value Date   WBC 5.1 10/24/2012   NEUTROABS 3.2 10/24/2012   HGB 10.8* 10/24/2012   HCT 32.4* 10/24/2012   MCV 85.4 10/24/2012   PLT 226 10/24/2012      Chemistry      Component Value Date/Time   NA 141 10/24/2012 0829   NA 141 03/28/2012 1050   K 3.6 10/24/2012 0829   K 4.1 03/28/2012 1050   CL 107 10/24/2012 0829   CL 106 03/28/2012 1050   CO2 24 10/24/2012 0829   CO2 25 03/28/2012 1050   BUN 19.0 10/24/2012 0829   BUN 15 03/28/2012 1050   CREATININE 0.8 10/24/2012 0829   CREATININE 0.72 03/28/2012 1050      Component Value Date/Time   CALCIUM 9.3 10/24/2012 0829   CALCIUM 9.2 03/28/2012 1050   ALKPHOS 31* 10/24/2012 0829   ALKPHOS 32* 03/28/2012 1050   AST 15 10/24/2012 0829   AST 14 03/28/2012 1050   ALT 10 10/24/2012 0829   ALT 10 03/28/2012 1050   BILITOT 0.24 10/24/2012 0829   BILITOT 0.3 03/28/2012 1050       Lab Results  Component Value Date   LABCA2 19 10/24/2012     STUDIES:  No results found.    ASSESSMENT: 57 y.o.  [name]   (1)  status post left lumpectomy and sentinel lymph node dissection April of 2012 for a T1b N1(mic) stage II invasive ductal carcinoma, grade 1, estrogen receptor 82% and progesterone receptor 92% positive, with no HER-2 amplification, and an MIB-1-1 of 17%,   (2)  The patient's Oncotype DX score of 21 predicts a 13% risk of distant recurrence after 5 years of tamoxifen.  (3)  status post radiation completed August of 2012,   (4)  on tamoxifen since September of 2012    PLAN: With regards to her breast cancer, Abeera appears to be doing well, with no clinical evidence of  disease recurrence.  We discussed taking a break from tamoxifen to see if any of her pain improves, but after much discussion, she has decided to continue on tamoxifen as before, 20 mg daily. I think is very  likely that her muscle pain and joint pain is attributed to arthritis and her history of fibromyalgia.  Lulamae is worried about the mildly elevated glucose of 119 drawn last week (she was fasting), and we will repeat a fasting metabolic panel and hemoglobin Z6X at her convenience. She scheduled to see her primary care physician, Dr. Jonny Ruiz, in early may.  In the meanwhile, she is also being scheduled for her next annual mammogram at [facility] in March, and will return to see Dr. Darnelle Catalan in April. We will repeat her labs at that time, including her hormone levels since she has not had a menstrual cycle since March or April of 2012.  The plan is to continue tamoxifen until she is clearly postmenopausal, at which point we may consider switching her to an aromatase inhibitor.  I again encouraged Tanairi to increase her exercise. She voices her understanding and agreement with our plan, and will call with any changes or problems.    Robyn Nohr    11/01/2012

## 2012-11-07 ENCOUNTER — Other Ambulatory Visit (HOSPITAL_BASED_OUTPATIENT_CLINIC_OR_DEPARTMENT_OTHER): Payer: 59 | Admitting: Lab

## 2012-11-07 DIAGNOSIS — C50919 Malignant neoplasm of unspecified site of unspecified female breast: Secondary | ICD-10-CM

## 2012-11-07 DIAGNOSIS — C50912 Malignant neoplasm of unspecified site of left female breast: Secondary | ICD-10-CM

## 2012-11-07 DIAGNOSIS — R7301 Impaired fasting glucose: Secondary | ICD-10-CM

## 2012-11-07 LAB — COMPREHENSIVE METABOLIC PANEL (CC13)
ALT: 15 U/L (ref 0–55)
AST: 13 U/L (ref 5–34)
Albumin: 3.1 g/dL — ABNORMAL LOW (ref 3.5–5.0)
Alkaline Phosphatase: 30 U/L — ABNORMAL LOW (ref 40–150)
BUN: 16 mg/dL (ref 7.0–26.0)
CO2: 25 mEq/L (ref 22–29)
Calcium: 9.6 mg/dL (ref 8.4–10.4)
Chloride: 106 mEq/L (ref 98–107)
Creatinine: 0.7 mg/dL (ref 0.6–1.1)
Glucose: 128 mg/dl — ABNORMAL HIGH (ref 70–99)
Potassium: 3.9 mEq/L (ref 3.5–5.1)
Sodium: 141 mEq/L (ref 136–145)
Total Bilirubin: 0.23 mg/dL (ref 0.20–1.20)
Total Protein: 7.3 g/dL (ref 6.4–8.3)

## 2012-11-07 LAB — HEMOGLOBIN A1C
Hgb A1c MFr Bld: 7.1 % — ABNORMAL HIGH (ref <5.7)
Mean Plasma Glucose: 157 mg/dL — ABNORMAL HIGH (ref <117)

## 2012-11-08 ENCOUNTER — Telehealth: Payer: Self-pay | Admitting: *Deleted

## 2012-11-08 NOTE — Telephone Encounter (Signed)
This RN called pt and informed her of abnormal values per hemoglobin A1C and need to follow up with primary MD ASAP.  Primary MD is Dr Oliver Barre .  Patient verbalized understanding.  Labs and last dictation faxed to above MD.

## 2012-11-09 ENCOUNTER — Ambulatory Visit (INDEPENDENT_AMBULATORY_CARE_PROVIDER_SITE_OTHER): Payer: 59 | Admitting: Internal Medicine

## 2012-11-09 ENCOUNTER — Encounter: Payer: Self-pay | Admitting: Internal Medicine

## 2012-11-09 ENCOUNTER — Telehealth: Payer: Self-pay

## 2012-11-09 VITALS — BP 110/72 | HR 78 | Temp 98.0°F | Ht 59.0 in | Wt 229.5 lb

## 2012-11-09 DIAGNOSIS — E119 Type 2 diabetes mellitus without complications: Secondary | ICD-10-CM | POA: Insufficient documentation

## 2012-11-09 DIAGNOSIS — R911 Solitary pulmonary nodule: Secondary | ICD-10-CM

## 2012-11-09 DIAGNOSIS — R197 Diarrhea, unspecified: Secondary | ICD-10-CM

## 2012-11-09 DIAGNOSIS — IMO0001 Reserved for inherently not codable concepts without codable children: Secondary | ICD-10-CM

## 2012-11-09 DIAGNOSIS — R21 Rash and other nonspecific skin eruption: Secondary | ICD-10-CM | POA: Insufficient documentation

## 2012-11-09 MED ORDER — METFORMIN HCL ER 500 MG PO TB24: 500.0000 mg | ORAL_TABLET | Freq: Every day | ORAL | Status: DC

## 2012-11-09 NOTE — Telephone Encounter (Signed)
The patient would like to know what to do concerning the abdominal pain discussed at OV today.  MD instructions did not state anything about problem discussed?

## 2012-11-09 NOTE — Telephone Encounter (Signed)
Viral or food poisoning symptoms normall resolve on their own in several days, ok to use immodium prn, or tylenol as needed; pt to call if pain, fever, blood in stool or other symptoms worse

## 2012-11-09 NOTE — Patient Instructions (Addendum)
Please take all new medication as prescribed, but only start Next Monday after your current GI illness has improved Please continue all other medications as before Thank you for enrolling in MyChart. Please follow the instructions below to securely access your online medical record. MyChart allows you to send messages to your doctor, view your test results, renew your prescriptions, schedule appointments, and more. To Log into My Chart online, please go by [employer] or [employer] to [email or web address], or download the MyChart App from the [employer] of [employer] .  Your Username is: [email or web address] ([email or web address]) Please send a practice Message on Mychart later today.

## 2012-11-09 NOTE — Progress Notes (Signed)
Subjective:    Patient ID: [name], female    DOB: 1956/03/08, 57 y.o.   MRN: [id number]  HPI  Here to f/u, after recent episode 3-4 days ago (now improved) of new worsening RUQ pain/ mod to severe, crampy "hard" pain assoc with mult episodes watery diarrhea over 2-3 days, now better, and not assoc n/v, fever, blood, bloating.  Has some decreased appetite, just 'doesnt feel good", no other sick contacts, may have started about the time she recalled eating a sour tangelo, pain some better with ibuprofen overall.  Pt denies chest pain, increased sob or doe, wheezing, orthopnea, PND, increased LE swelling, palpitations, dizziness or syncope.  Pt denies polydipsia, polyuria, or low sugar symptoms such as weakness or confusion improved with po intake.  Pt denies new neurological symptoms such as new headache, or facial or extremity weakness or numbness.   Pt states overall good compliance with meds, has not been to Dm education in the past, and had recent slight elevation a1c on jan 20, new for her.  Has gained several lbs and admits to diet indiscretions mult in the past few months.    Had incidental pulm nodule subpleural noted per CT 2013.  Recent natal cleft rash resolved with prn desitin.  Past Medical History  Diagnosis Date  . HYPERLIPIDEMIA 05/11/2008  . OBSTRUCTIVE SLEEP APNEA 05/11/2008  . RASH-NONVESICULAR 06/29/2008  . COLONIC POLYPS, HX OF 05/11/2008  . GENITAL HERPES, HX OF 08/08/2009  . Inverted nipple   . Menorrhagia   . 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 LLQ pain   . Hernia   . Low iron   . H/O gonorrhea   . Bacterial infection   . Trichomonas   . H/O irritable bowel syndrome   . Breast cancer     2012  . Hypertension   . Arthritis   . Abscess of buttock    Past Surgical History  Procedure Date  . Cesarean section   . Right achilles tendon     and left  . S/p ear surgury   . Right ovarian cyst     hx  . S/p extra uterine fibroid 2006  . S/p left knee  replacement 2007  . Dilation and curettage of uterus   . Uterine fibroid surgery 2006  . Breast cyst excision 1973  . Breast lumpectomy   . Axillary lymph node dissection     reports that she has never smoked. She has never used smokeless tobacco. She reports that she drinks alcohol. She reports that she does not use illicit drugs. family history includes Alcohol abuse in her father; Diabetes in her mother; Heart disease in her father; Hypertension in her mother; and Stroke in her mother.  There is no history of Colon cancer. Allergies  Allergen Reactions  . Codeine   . Cymbalta (Duloxetine Hcl) Other (See Comments)    Head felt funny, ? thinking not right  . Darvon   . Propoxyphene Hcl    Current Outpatient Prescriptions on File Prior to Visit  Medication Sig Dispense Refill  . ibuprofen (ADVIL,MOTRIN) 800 MG tablet Take 800 mg by mouth 3 (three) times daily.      . tamoxifen (NOLVADEX) 20 MG tablet Take 1 tablet (20 mg total) by mouth daily.  90 tablet  1  . telmisartan-hydrochlorothiazide (MICARDIS HCT) 80-25 MG per tablet Take 1 tablet by mouth daily.  90 tablet  3  . valACYclovir (VALTREX) 1000 MG tablet Take 1 tablet (1,000 mg total) by  mouth daily.  90 tablet  3  . cholecalciferol (VITAMIN D) 400 UNITS TABS Take 1,000 Units by mouth daily.      . Ferrous Sulfate (IRON) 325 (65 FE) MG TABS Take by mouth daily.        . fish oil-omega-3 fatty acids 1000 MG capsule Take 1 g by mouth daily.      . hyoscyamine (LEVSIN SL) 0.125 MG SL tablet Place 0.25 mg under the tongue every 4 (four) hours as needed.      . metFORMIN (GLUCOPHAGE XR) 500 MG 24 hr tablet Take 1 tablet (500 mg total) by mouth daily with breakfast.  90 tablet  3  . Multiple Vitamin (MULTIVITAMIN) tablet Take 1 tablet by mouth daily.      [name] nystatin cream (MYCOSTATIN) Apply to affected area 4 times daily  30 g  2  . vitamin C (ASCORBIC ACID) 500 MG tablet Take 500 mg by mouth daily.      . [DISCONTINUED] tolterodine  (DETROL LA) 4 MG 24 hr capsule Take 4 mg by mouth daily.       Review of Systems  Constitutional: Negative for unexpected weight change, or unusual diaphoresis  HENT: Negative for tinnitus.   Eyes: Negative for photophobia and visual disturbance.  Respiratory: Negative for choking and stridor.   Gastrointestinal: Negative for vomiting and blood in stool.  Genitourinary: Negative for hematuria and decreased urine volume.  Musculoskeletal: Negative for acute joint swelling Skin: Negative for color change and wound.  Neurological: Negative for tremors and numbness other than noted  Psychiatric/Behavioral: Negative for decreased concentration or  hyperactivity.       Objective:   Physical Exam BP 110/72  Pulse 78  Temp 98 F (36.7 C) (Oral)  Ht 4\' 11"  (1.499 m)  Wt 229 lb 8 oz (104.101 kg)  BMI 46.35 kg/m2  SpO2 97% VS noted, mild ill and uncomfortable appearing, fatigued Constitutional: Pt appears well-developed and well-nourished.  HENT: Head: NCAT.  Right Ear: External ear normal.  Left Ear: External ear normal.  Eyes: Conjunctivae and EOM are normal. Pupils are equal, round, and reactive to light.  Neck: Normal range of motion. Neck supple.  Cardiovascular: Normal rate and regular rhythm.   Pulmonary/Chest: Effort normal and breath sounds normal.  Abd:  Soft, NT, non-distended, + BS except for right upper quad tender only to mod palpation, no guarding or rebound  Neurological: Pt is alert. Not confused  Skin: Skin is warm. No erythema.  Psychiatric: Pt behavior is normal. Thought content normal. prob at least mild dysphoric    Assessment & Plan:

## 2012-11-09 NOTE — Telephone Encounter (Signed)
Called left message to call back 

## 2012-11-09 NOTE — Telephone Encounter (Signed)
Called the patient back left detailed message of MD instructions (per pt. Request ok to leave a message).

## 2012-11-09 NOTE — Assessment & Plan Note (Signed)
Seen per CT chest per surgury late 2013, pt plans to f/u with them at 6 mo for possible repeat

## 2012-11-09 NOTE — Assessment & Plan Note (Signed)
New onset, for Dm education, to start metformin ER 500 qd but to wait to start until current GI symptoms resolved

## 2012-11-09 NOTE — Assessment & Plan Note (Signed)
With ruq pain, now improved today, ? Viral illness vs other, he has had no fever, diarrheal symptoms improving it seems, and no blood or vomitiing; ok to follow for now, for immodium prn, tylenol prn

## 2012-11-09 NOTE — Assessment & Plan Note (Signed)
To natal cleft area improved with prn desitin only, no need further surgical tx

## 2012-11-29 ENCOUNTER — Encounter: Payer: 59 | Attending: Internal Medicine | Admitting: *Deleted

## 2012-11-29 VITALS — Ht 59.0 in | Wt 230.9 lb

## 2012-11-29 DIAGNOSIS — IMO0001 Reserved for inherently not codable concepts without codable children: Secondary | ICD-10-CM

## 2012-11-29 DIAGNOSIS — E119 Type 2 diabetes mellitus without complications: Secondary | ICD-10-CM | POA: Insufficient documentation

## 2012-11-29 DIAGNOSIS — Z713 Dietary counseling and surveillance: Secondary | ICD-10-CM | POA: Insufficient documentation

## 2012-12-03 ENCOUNTER — Other Ambulatory Visit: Payer: Self-pay

## 2012-12-04 ENCOUNTER — Encounter: Payer: Self-pay | Admitting: *Deleted

## 2012-12-04 NOTE — Patient Instructions (Signed)
Goals:  Follow Diabetes Meal Plan as instructed  Eat 3 meals and 2 snacks, every 3-5 hrs  Limit carbohydrate intake to 30-45 grams carbohydrate/meal  Limit carbohydrate intake to 0-15 grams carbohydrate/snack  Add lean protein foods to meals/snacks  Monitor glucose levels as instructed by your doctor  Aim for 30 mins of physical activity daily as tolerated  Bring food record and glucose log to your next nutrition visit

## 2012-12-04 NOTE — Progress Notes (Signed)
  Patient was seen on 11/29/2012 for the first of a series of three diabetes self-management courses at the [facility]. Current A1c on 11/07/12 was 7.1% The following learning objectives were met by the patient during this course:   Defines the role of glucose and insulin  Identifies type of diabetes and pathophysiology  Defines the diagnostic criteria for diabetes and prediabetes  States the risk factors for Type 2 Diabetes  States the symptoms of Type 2 Diabetes  Defines Type 2 Diabetes treatment goals  Defines Type 2 Diabetes treatment options  States the rationale for glucose monitoring  Identifies A1C, glucose targets, and testing times  Identifies proper sharps disposal  Defines the purpose of a diabetes food plan  Identifies carbohydrate food groups  Defines effects of carbohydrate foods on glucose levels  Identifies carbohydrate choices/grams/food labels  States benefits of physical activity and effect on glucose  Review of suggested activity guidelines  Handouts given during class include:  Type 2 Diabetes: Basics Book  My Food Plan Book  Food and Activity Log   Follow-Up Plan: Core Class 2

## 2012-12-06 ENCOUNTER — Encounter: Payer: 59 | Admitting: *Deleted

## 2012-12-06 DIAGNOSIS — IMO0001 Reserved for inherently not codable concepts without codable children: Secondary | ICD-10-CM

## 2012-12-07 NOTE — Patient Instructions (Signed)
Goals:  Follow Diabetes Meal Plan as instructed  Eat 3 meals and 2 snacks, every 3-5 hrs  Limit carbohydrate intake to 45 grams carbohydrate/meal  Limit carbohydrate intake to 15 grams carbohydrate/snack  Add lean protein foods to meals/snacks  Monitor glucose levels as instructed by your doctor  Aim for 30 mins of physical activity daily  Bring food record and glucose log to your next nutrition visit  

## 2012-12-07 NOTE — Progress Notes (Signed)
  Patient was seen on 12/06/12 for the second of a series of three diabetes self-management courses at the [facility]. The following learning objectives were met by the patient during this course:   Explain basic nutrition maintenance and quality assurance  Describe causes, symptoms and treatment of hypoglycemia and hyperglycemia  Explain how to manage diabetes during illness  Describe the importance of good nutrition for health and healthy eating strategies  List strategies to follow meal plan when dining out  Describe the effects of alcohol on glucose and how to use it safely  Describe problem solving skills for day-to-day glucose challenges  Describe strategies to use when treatment plan needs to change  Identify important factors involved in successful weight loss  Describe ways to remain physically active  Describe the impact of regular activity on insulin resistance   Handouts given in class:  Refrigerator magnet for Sick Day Guidelines  [facility] Oral medication/insulin handout  Follow-Up Plan: Patient will attend the final class of the ADA Diabetes Self-Care Education.

## 2012-12-20 ENCOUNTER — Encounter: Payer: 59 | Attending: Internal Medicine | Admitting: Dietician

## 2012-12-20 DIAGNOSIS — E119 Type 2 diabetes mellitus without complications: Secondary | ICD-10-CM | POA: Insufficient documentation

## 2012-12-20 DIAGNOSIS — IMO0001 Reserved for inherently not codable concepts without codable children: Secondary | ICD-10-CM

## 2012-12-20 DIAGNOSIS — Z713 Dietary counseling and surveillance: Secondary | ICD-10-CM | POA: Insufficient documentation

## 2012-12-21 ENCOUNTER — Other Ambulatory Visit: Payer: Self-pay | Admitting: Oncology

## 2012-12-21 DIAGNOSIS — C50912 Malignant neoplasm of unspecified site of left female breast: Secondary | ICD-10-CM

## 2012-12-21 NOTE — Progress Notes (Signed)
  Patient was seen on 12/20/2012 for the third of a series of three diabetes self-management courses at the [facility]. The following learning objectives were met by the patient during this course:    Describe how diabetes changes over time   Identify diabetes complications and ways to prevent them   Describe strategies that can promote heart health including lowering blood pressure and cholesterol   Describe strategies to lower dietary fat and sodium in the diet   Identify physical activities that benefit cardiovascular health   Evaluate success in meeting personal goal   Describe the belief that they can live successfully with diabetes day to day   Establish 2-3 goals that they will plan to diligently work on until they return for the free 63-month follow-up visit  The following handouts were given in class:  3 Month Follow Up Visit handout  Goal setting handout  Class evaluation form  Your patient has established the following 3 month goals for diabetes self-care:  Be active 15 minutes or more 3 times per week.  Go to bed earlier.  Test my glucose at least 2 times per day, 2 times per week.  Initial Vitals: HT: 4'11''  WT: 230.9 lb   A1C: 7.1% (10/28/2012)  Follow-Up Plan: Patient will attend a 3 month follow-up visit for diabetes self-management education.

## 2012-12-28 ENCOUNTER — Encounter: Payer: Self-pay | Admitting: Internal Medicine

## 2012-12-30 ENCOUNTER — Ambulatory Visit: Payer: 59 | Admitting: Internal Medicine

## 2013-01-11 LAB — HM MAMMOGRAPHY: HM Mammogram: NEGATIVE

## 2013-01-18 ENCOUNTER — Other Ambulatory Visit (HOSPITAL_BASED_OUTPATIENT_CLINIC_OR_DEPARTMENT_OTHER): Payer: 59 | Admitting: Lab

## 2013-01-18 DIAGNOSIS — C50912 Malignant neoplasm of unspecified site of left female breast: Secondary | ICD-10-CM

## 2013-01-18 DIAGNOSIS — D509 Iron deficiency anemia, unspecified: Secondary | ICD-10-CM

## 2013-01-18 DIAGNOSIS — C50119 Malignant neoplasm of central portion of unspecified female breast: Secondary | ICD-10-CM

## 2013-01-18 LAB — CBC WITH DIFFERENTIAL/PLATELET
BASO%: 0.6 % (ref 0.0–2.0)
Basophils Absolute: 0 10*3/uL (ref 0.0–0.1)
EOS%: 1.7 % (ref 0.0–7.0)
Eosinophils Absolute: 0.1 10*3/uL (ref 0.0–0.5)
HCT: 33.9 % — ABNORMAL LOW (ref 34.8–46.6)
HGB: 10.8 g/dL — ABNORMAL LOW (ref 11.6–15.9)
LYMPH%: 30.6 % (ref 14.0–49.7)
MCH: 27.5 pg (ref 25.1–34.0)
MCHC: 31.9 g/dL (ref 31.5–36.0)
MCV: 86.2 fL (ref 79.5–101.0)
MONO#: 0.5 10*3/uL (ref 0.1–0.9)
MONO%: 10.6 % (ref 0.0–14.0)
NEUT#: 2.5 10*3/uL (ref 1.5–6.5)
NEUT%: 56.5 % (ref 38.4–76.8)
Platelets: 213 10*3/uL (ref 145–400)
RBC: 3.93 10*6/uL (ref 3.70–5.45)
RDW: 14.1 % (ref 11.2–14.5)
WBC: 4.4 10*3/uL (ref 3.9–10.3)
lymph#: 1.3 10*3/uL (ref 0.9–3.3)

## 2013-01-18 LAB — COMPREHENSIVE METABOLIC PANEL (CC13)
ALT: 11 U/L (ref 0–55)
AST: 15 U/L (ref 5–34)
Albumin: 3.2 g/dL — ABNORMAL LOW (ref 3.5–5.0)
Alkaline Phosphatase: 37 U/L — ABNORMAL LOW (ref 40–150)
BUN: 15 mg/dL (ref 7.0–26.0)
CO2: 26 mEq/L (ref 22–29)
Calcium: 9.3 mg/dL (ref 8.4–10.4)
Chloride: 106 mEq/L (ref 98–107)
Creatinine: 0.8 mg/dL (ref 0.6–1.1)
Glucose: 99 mg/dl (ref 70–99)
Potassium: 3.9 mEq/L (ref 3.5–5.1)
Sodium: 140 mEq/L (ref 136–145)
Total Bilirubin: 0.33 mg/dL (ref 0.20–1.20)
Total Protein: 7.3 g/dL (ref 6.4–8.3)

## 2013-01-18 LAB — FOLLICLE STIMULATING HORMONE: FSH: 10.2 m[IU]/mL

## 2013-01-18 LAB — FERRITIN: Ferritin: 100 ng/mL (ref 10–291)

## 2013-01-18 LAB — LUTEINIZING HORMONE: LH: 5.2 m[IU]/mL

## 2013-01-27 LAB — ESTRADIOL, ULTRA SENS: Estradiol, Ultra Sensitive: 15 pg/mL

## 2013-01-30 ENCOUNTER — Ambulatory Visit (HOSPITAL_BASED_OUTPATIENT_CLINIC_OR_DEPARTMENT_OTHER): Payer: 59 | Admitting: Oncology

## 2013-01-30 ENCOUNTER — Telehealth: Payer: Self-pay | Admitting: Oncology

## 2013-01-30 ENCOUNTER — Other Ambulatory Visit: Payer: Self-pay | Admitting: *Deleted

## 2013-01-30 ENCOUNTER — Telehealth: Payer: Self-pay | Admitting: *Deleted

## 2013-01-30 VITALS — BP 112/69 | HR 61 | Resp 98 | Ht 59.0 in | Wt 229.4 lb

## 2013-01-30 DIAGNOSIS — C50912 Malignant neoplasm of unspecified site of left female breast: Secondary | ICD-10-CM

## 2013-01-30 DIAGNOSIS — R935 Abnormal findings on diagnostic imaging of other abdominal regions, including retroperitoneum: Secondary | ICD-10-CM

## 2013-01-30 DIAGNOSIS — Z170 Estrogen receptor positive status [ER+]: Secondary | ICD-10-CM

## 2013-01-30 DIAGNOSIS — C50119 Malignant neoplasm of central portion of unspecified female breast: Secondary | ICD-10-CM

## 2013-01-30 MED ORDER — ANASTROZOLE 1 MG PO TABS: 1.0000 mg | ORAL_TABLET | Freq: Every day | ORAL | Status: DC

## 2013-01-30 NOTE — Telephone Encounter (Signed)
gv appt for AGB in July of this year. Gv appt for a lab a week before ov for May 2015. Made pt aware that she will be called for her CT appt d/t....td

## 2013-01-30 NOTE — Progress Notes (Signed)
ID: [name]   DOB: Mar 29, 1956  MR#: [id number]  CSN#:[id number]  HISTORY OF PRESENT ILLNESS: The patient had screening mammography in January 16, 2011 (she missed her mammogram on January 05, 2010) and this showed some changes in the left breast, which measured 1.1 cm.  Left breast ultrasound was obtained the same day and confirmed a small hypoechoic lobular area of nodularity measuring again 1.1 cm.  There was no other abnormality and the patient was set up for ultrasound-guided needle biopsy January 22, 2011.  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  Tumor was estrogen receptor positive at 82%, progesterone receptor positive at 92% with MIB-1 of 17% and no evidence of HER2 amplification, the ratio by CISH being 0.97.  With this information the patient was set up for bilateral breast MRI s and these were obtained January 29, 2011.  In the central left breast in the retroareolar location there was a spiculated enhancing mass with a regular margins measuring again 1.1 cm.  There were no other areas of abnormality.  She had her left lumpectomy and sentinel lymph node sampling on February 10, 2011.  The pathology report ([id number]) shows an 8-mm invasive ductal carcinoma, grade 1, involving 1/2 sentinel lymph nodes with a micrometastatic deposit.  The inferior margin was apparently broadly positive initially, but may have been cleared with subsequent excision in the same procedure.  The pathology report does not commit, but says the margin has been "likely cleared."   Her Oncotype DX gave her a recurrence score of 21%, predicting a risk of recurrent for node positive patients in the 12% range.  She decided to forego chemotherapy, but proceded with radiation therapy. She completed radiation in August 2012, and started on tamoxifen the first week of September 2012. Her subsequent history is as detailed below.  INTERVAL HISTORY: [name]  returns today for followup of her breast cancer. The biggest problem she is having is her neighbor, who lets to her dog roam and soils [name]'s yard. She has a ready called animal control twice   Review of Systems: She has occasional muscle aches and particularly left knee is painful to her. She has loose bowel movements doubtless secondary to the metformin she is taking. The back and joint pain persists, it is not worse than before. She does get a new prescription. She is concerned about the "spot in her lung" which was noted incidentally on an abdominal CT November 2013. Otherwise a detailed review of systems today was noncontributory  PAST MEDICAL HISTORY: Past Medical History  Diagnosis Date  . HYPERLIPIDEMIA 05/11/2008  . OBSTRUCTIVE SLEEP APNEA 05/11/2008  . RASH-NONVESICULAR 06/29/2008  . COLONIC POLYPS, HX OF 05/11/2008  . GENITAL HERPES, HX OF 08/08/2009  . Inverted nipple   . Menorrhagia   . 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 LLQ pain   . Hernia   . Low iron   . H/O gonorrhea   . Bacterial infection   . Trichomonas   . H/O irritable bowel syndrome   . Breast cancer     2012  . Hypertension   . Arthritis   . Abscess of buttock   . Diabetes mellitus without complication   Is significant for hypertension, obesity, early glaucoma, removal of a right breast cysts in 1972, total left knee replacement in 2007, possible irritable bowel syndrome, history of fibroid uterus surgery in 2006, history of C-section in 1981 and history of remote repair of  the right and left Achilles tendons.  PAST SURGICAL HISTORY: Past Surgical History  Procedure Laterality Date  . Cesarean section    . Right achilles tendon      and left  . S/p ear surgury    . Right ovarian cyst      hx  . S/p extra uterine fibroid  2006  . S/p left knee replacement  2007  . Dilation and curettage of uterus    . Uterine fibroid surgery  2006  . Breast cyst excision  1973  . Breast lumpectomy    . Axillary  lymph node dissection      FAMILY HISTORY Family History  Problem Relation Age of Onset  . Diabetes Mother   . Hypertension Mother   . Stroke Mother   . Heart disease Father     COPD  . Alcohol abuse Father     ETOH dependence  . Colon cancer Neg Hx   The patient's mother is alive..  The patient's father died in his early 20s from congestive heart failure.  The patient had five brothers and three sisters; most of theses siblings are half siblings, but in any case there is no history of breast or ovarian cancer in the immediate family.  There was one maternal aunt (out of a total of four) diagnosed with breast cancer in her 41s.  GYNECOLOGIC HISTORY: The patient had menarche age 34, she is still having regular periods.  She is Gx, P1, first pregnancy to term at age 34.  She stopped having menstrual periods August of 2012, but had one anomalous. In April of 2013.  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  She has been divorced more than 10 years and lives by herself at home with no pets.  Her one child, a son, died at age 30.   ADVANCED DIRECTIVES: not in place  HEALTH MAINTENANCE: History  Substance Use Topics  . Smoking status: Never Smoker   . Smokeless tobacco: Never Used  . Alcohol Use: Yes     Comment: occasionally drinks wine     Colonoscopy:  PAP:  Bone density:  Lipid panel:  Allergies  Allergen Reactions  . Codeine   . Cymbalta (Duloxetine Hcl) Other (See Comments)    Head felt funny, ? thinking not right  . Darvon   . Propoxyphene Hcl     Current Outpatient Prescriptions  Medication Sig Dispense Refill  . cholecalciferol (VITAMIN D) 400 UNITS TABS Take 1,000 Units by mouth daily.      . Ferrous Sulfate (IRON) 325 (65 FE) MG TABS Take by mouth daily.        . fish oil-omega-3 fatty acids 1000 MG capsule Take 1 g by mouth daily.      . hyoscyamine (LEVSIN SL) 0.125 MG SL tablet Place 0.25 mg under the tongue every 4 (four) hours as  needed.      [name] ibuprofen (ADVIL,MOTRIN) 800 MG tablet Take 800 mg by mouth 3 (three) times daily.      . metFORMIN (GLUCOPHAGE XR) 500 MG 24 hr tablet Take 1 tablet (500 mg total) by mouth daily with breakfast.  90 tablet  3  . Multiple Vitamin (MULTIVITAMIN) tablet Take 1 tablet by mouth daily.      [name] nystatin cream (MYCOSTATIN) Apply to affected area 4 times daily  30 g  2  . tamoxifen (NOLVADEX) 20 MG tablet Take 1 tablet by mouth  daily  90 tablet  1  .  telmisartan-hydrochlorothiazide (MICARDIS HCT) 80-25 MG per tablet Take 1 tablet by mouth daily.  90 tablet  3  . valACYclovir (VALTREX) 1000 MG tablet Take 1 tablet (1,000 mg total) by mouth daily.  90 tablet  3  . vitamin C (ASCORBIC ACID) 500 MG tablet Take 500 mg by mouth daily.      . [DISCONTINUED] tolterodine (DETROL LA) 4 MG 24 hr capsule Take 4 mg by mouth daily.       No current facility-administered medications for this visit.    OBJECTIVE: Middle-aged [place] American woman in no acute distress Filed Vitals:   01/30/13 0859  BP: 112/69  Pulse: 61  Resp: 98     Body mass index is 46.31 kg/(m^2).    ECOG FS: 1 Filed Weights   01/30/13 0859  Weight: 229 lb 6.4 oz (104.055 kg)   Sclerae unicteric Oropharynx clear No cervical or supraclavicular adenopathy Lungs no rales or rhonchi Heart regular rate and rhythm Abdomen  Obese, soft, nontender; positive bowel sounds MSK no focal spinal tenderness,  No peripheral edema Neuro: nonfocal, well oriented, friendly affect Breasts: The right breast is unremarkable. The left breast is status post lumpectomy and radiation. There is no evidence of local. Axillae are benign bilaterally   LAB RESULTS: Lab Results  Component Value Date   WBC 4.4 01/18/2013   NEUTROABS 2.5 01/18/2013   HGB 10.8* 01/18/2013   HCT 33.9* 01/18/2013   MCV 86.2 01/18/2013   PLT 213 01/18/2013      Chemistry      Component Value Date/Time   NA 140 01/18/2013 0909   NA 141 03/28/2012 1050   K 3.9 01/18/2013  0909   K 4.1 03/28/2012 1050   CL 106 01/18/2013 0909   CL 106 03/28/2012 1050   CO2 26 01/18/2013 0909   CO2 25 03/28/2012 1050   BUN 15.0 01/18/2013 0909   BUN 15 03/28/2012 1050   CREATININE 0.8 01/18/2013 0909   CREATININE 0.72 03/28/2012 1050      Component Value Date/Time   CALCIUM 9.3 01/18/2013 0909   CALCIUM 9.2 03/28/2012 1050   ALKPHOS 37* 01/18/2013 0909   ALKPHOS 32* 03/28/2012 1050   AST 15 01/18/2013 0909   AST 14 03/28/2012 1050   ALT 11 01/18/2013 0909   ALT 10 03/28/2012 1050   BILITOT 0.33 01/18/2013 0909   BILITOT 0.3 03/28/2012 1050       Lab Results  Component Value Date   LABCA2 19 10/24/2012     STUDIES:  Mamm[facility] at [facility] 01/09/2013 was stable. CT scan of the abdomen and pelvis 08/31/2012 showed diverticulosis, a fibroid uterus, and a 3 mm nodule in the posterior left lower lobe which is felt to be a subpleural lymph node. Chest CT scan within a year was recommended.    ASSESSMENT: 57 y.o.  [place] woman   (1)  status post left lumpectomy and sentinel lymph node dissection April of 2012 for a T1b N1(mic) stage IB invasive ductal carcinoma, grade 1, estrogen receptor 82% and progesterone receptor 92% positive, with no HER-2 amplification, and an MIB-1-1 of 17%,   (2)  The patient's Oncotype DX score of 21 predicts a 13% risk of distant recurrence after 5 years of tamoxifen.  (3)  status post radiation completed August of 2012,   (4)  on tamoxifen from September of 2012 to April 2014  (5) anastrozole started April 2014   PLAN: And she'll let us SS age and [facility] are not elevated but her  estradiol level is low. I think that it is possible to try an aromatase inhibitor, and we discussed the advantage to her namely that she would be able to discontinue anti-estrogens at 5 years, instead of continuing tamoxifen for total of 10 years. Accordingly she will stop tamoxifen and start anastrozole at this point. She will see [place] again in 3 months. We will repeat an estradiol level  before that visit.  We discussed the issue of the 3 mm nodule noticed incidentally on recent CT of the abdomen and pelvis. This most likely is of no consequence, and certainly a repeating a CT of the chest would subject her to further radiation. On the other hand she remains very concerned about this so we decided we would repeat a chest CT before her visit with me may of 2015. She knows to call for any problems that may develop before then.  Khloei Spiker C    01/30/2013

## 2013-02-08 ENCOUNTER — Ambulatory Visit (INDEPENDENT_AMBULATORY_CARE_PROVIDER_SITE_OTHER): Payer: 59 | Admitting: Internal Medicine

## 2013-02-08 ENCOUNTER — Other Ambulatory Visit (INDEPENDENT_AMBULATORY_CARE_PROVIDER_SITE_OTHER): Payer: 59

## 2013-02-08 ENCOUNTER — Encounter: Payer: Self-pay | Admitting: Internal Medicine

## 2013-02-08 VITALS — BP 102/62 | HR 70 | Temp 97.8°F | Ht <= 58 in | Wt 233.4 lb

## 2013-02-08 DIAGNOSIS — Z0000 Encounter for general adult medical examination without abnormal findings: Secondary | ICD-10-CM

## 2013-02-08 DIAGNOSIS — IMO0001 Reserved for inherently not codable concepts without codable children: Secondary | ICD-10-CM

## 2013-02-08 DIAGNOSIS — R21 Rash and other nonspecific skin eruption: Secondary | ICD-10-CM | POA: Insufficient documentation

## 2013-02-08 DIAGNOSIS — R10819 Abdominal tenderness, unspecified site: Secondary | ICD-10-CM | POA: Insufficient documentation

## 2013-02-08 LAB — MICROALBUMIN / CREATININE URINE RATIO
Creatinine,U: 249.5 mg/dL
Microalb Creat Ratio: 0.4 mg/g (ref 0.0–30.0)
Microalb, Ur: 1.1 mg/dL (ref 0.0–1.9)

## 2013-02-08 LAB — URINALYSIS, ROUTINE W REFLEX MICROSCOPIC
Bilirubin Urine: NEGATIVE
Ketones, ur: NEGATIVE
Leukocytes, UA: NEGATIVE
Nitrite: NEGATIVE
Specific Gravity, Urine: >=1.03 (ref 1.000–1.030)
Total Protein, Urine: NEGATIVE
Urine Glucose: NEGATIVE
Urobilinogen, UA: 0.2 (ref 0.0–1.0)
pH: 6 (ref 5.0–8.0)

## 2013-02-08 LAB — LIPID PANEL
Cholesterol: 173 mg/dL (ref 0–200)
HDL: 65.8 mg/dL (ref >39.00)
LDL Cholesterol: 86 mg/dL (ref 0–99)
Total CHOL/HDL Ratio: 3
Triglycerides: 106 mg/dL (ref 0.0–149.0)
VLDL: 21.2 mg/dL (ref 0.0–40.0)

## 2013-02-08 LAB — HEMOGLOBIN A1C: Hgb A1c MFr Bld: 6.3 % (ref 4.6–6.5)

## 2013-02-08 LAB — TSH: TSH: 1.39 u[IU]/mL (ref 0.35–5.50)

## 2013-02-08 MED ORDER — IRBESARTAN-HYDROCHLOROTHIAZIDE 300-25 MG PO TABS: 1.0000 | ORAL_TABLET | Freq: Every day | ORAL | Status: DC

## 2013-02-08 MED ORDER — NYSTATIN 100000 UNIT/GM EX POWD: CUTANEOUS | Status: DC

## 2013-02-08 NOTE — Assessment & Plan Note (Signed)

## 2013-02-08 NOTE — Patient Instructions (Addendum)
OK to stop the micardis HCT Ok to start the generic Avalide (one [facility], another script [facility] to [facility]) You are given the prescription for a glucometer, and supplies to check your sugar Please remember to followup with your GYN for the yearly pap smear and/or mammogram as you do Please continue your efforts at being more active, low cholesterol diet, and weight control. You are otherwise up to date with prevention measures today. Please take all new medication as prescribed - the nystatin powder ([facility]) Thank you for enrolling in MyChart. Please follow the instructions below to securely access your online medical record. MyChart allows you to send messages to your doctor, view your test results, renew your prescriptions, schedule appointments, and more. To Log into My Chart online, please go by [employer] or [employer] to [email or web address], or download the MyChart App from the [employer] of [employer] .  Your Username is: [email or web address] ([email or web address]) Please return in 6 months, or sooner if needed

## 2013-02-08 NOTE — Assessment & Plan Note (Signed)
stable overall by history and exam, recent data reviewed with pt, and pt to continue medical treatment as before,  to f/u any worsening symptoms or concerns Lab Results  Component Value Date   HGBA1C 6.3 02/08/2013

## 2013-02-08 NOTE — Assessment & Plan Note (Signed)
Unexpected on exam, significance unclear, no frank GU symptoms, for UA

## 2013-02-08 NOTE — Assessment & Plan Note (Signed)
To navel, for nystatin powder as cream not working well

## 2013-02-08 NOTE — Progress Notes (Signed)
Subjective:    Patient ID: [name], female    DOB: 1956-03-05, 57 y.o.   MRN: [id number]  HPI  Here for wellness and f/u;  Overall doing ok;  Pt denies CP, worsening SOB, DOE, wheezing, orthopnea, PND, worsening LE edema, palpitations, dizziness or syncope.  Pt denies neurological change such as new headache, facial or extremity weakness.  Pt denies polydipsia, polyuria, or low sugar symptoms. Pt states overall good compliance with treatment and medications, good tolerability, and has been trying to follow lower cholesterol diet.  Pt denies worsening depressive symptoms, suicidal ideation or panic. No fever, night sweats, wt loss, loss of appetite, or other constitutional symptoms.  Pt states good ability with ADL's, has low fall risk, home safety reviewed and adequate, no other significant changes in hearing or vision, and only occasionally active with exercise.  Mentions mid left back tender but no rash, swelling, redness.  Has rash to navel with bad smell.   Past Medical History  Diagnosis Date  . HYPERLIPIDEMIA 05/11/2008  . OBSTRUCTIVE SLEEP APNEA 05/11/2008  . RASH-NONVESICULAR 06/29/2008  . COLONIC POLYPS, HX OF 05/11/2008  . GENITAL HERPES, HX OF 08/08/2009  . Inverted nipple   . Menorrhagia   . 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 LLQ pain   . Hernia   . Low iron   . H/O gonorrhea   . Bacterial infection   . Trichomonas   . H/O irritable bowel syndrome   . Breast cancer     2012  . Hypertension   . Arthritis   . Abscess of buttock   . Diabetes mellitus without complication    Past Surgical History  Procedure Laterality Date  . Cesarean section    . Right achilles tendon      and left  . S/p ear surgury    . Right ovarian cyst      hx  . S/p extra uterine fibroid  2006  . S/p left knee replacement  2007  . Dilation and curettage of uterus    . Uterine fibroid surgery  2006  . Breast cyst excision  1973  . Breast lumpectomy    . Axillary lymph node  dissection      reports that she has never smoked. She has never used smokeless tobacco. She reports that  drinks alcohol. She reports that she does not use illicit drugs. family history includes Alcohol abuse in her father; Diabetes in her mother; Heart disease in her father; Hypertension in her mother; and Stroke in her mother.  There is no history of Colon cancer. Allergies  Allergen Reactions  . Codeine   . Cymbalta (Duloxetine Hcl) Other (See Comments)    Head felt funny, ? thinking not right  . Darvon   . Propoxyphene Hcl    Current Outpatient Prescriptions on File Prior to Visit  Medication Sig Dispense Refill  . anastrozole (ARIMIDEX) 1 MG tablet Take 1 tablet (1 mg total) by mouth daily.  90 tablet  12  . cholecalciferol (VITAMIN D) 400 UNITS TABS Take 1,000 Units by mouth daily.      . Ferrous Sulfate (IRON) 325 (65 FE) MG TABS Take by mouth daily.        . fish oil-omega-3 fatty acids 1000 MG capsule Take 1 g by mouth daily.      . hyoscyamine (LEVSIN SL) 0.125 MG SL tablet Place 0.25 mg under the tongue every 4 (four) hours as needed.      [name] ibuprofen (ADVIL,MOTRIN)  800 MG tablet Take 800 mg by mouth 3 (three) times daily.      . metFORMIN (GLUCOPHAGE XR) 500 MG 24 hr tablet Take 1 tablet (500 mg total) by mouth daily with breakfast.  90 tablet  3  . Multiple Vitamin (MULTIVITAMIN) tablet Take 1 tablet by mouth daily.      . valACYclovir (VALTREX) 1000 MG tablet Take 1 tablet (1,000 mg total) by mouth daily.  90 tablet  3  . vitamin C (ASCORBIC ACID) 500 MG tablet Take 500 mg by mouth daily.      . [DISCONTINUED] tolterodine (DETROL LA) 4 MG 24 hr capsule Take 4 mg by mouth daily.       No current facility-administered medications on file prior to visit.   Review of Systems Constitutional: Negative for diaphoresis, activity change, appetite change or unexpected weight change.  HENT: Negative for hearing loss, ear pain, facial swelling, mouth sores and neck stiffness.    Eyes: Negative for pain, redness and visual disturbance.  Respiratory: Negative for shortness of breath and wheezing.   Cardiovascular: Negative for chest pain and palpitations.  Gastrointestinal: Negative for diarrhea, blood in stool, abdominal distention or other pain Genitourinary: Negative for hematuria, flank pain or change in urine volume.  Musculoskeletal: Negative for myalgias and joint swelling.  Skin: Negative for color change and wound.  Neurological: Negative for syncope and numbness. other than noted Hematological: Negative for adenopathy.  Psychiatric/Behavioral: Negative for hallucinations, self-injury, decreased concentration and agitation.      Objective:   Physical Exam BP 102/62  Pulse 70  Temp(Src) 97.8 F (36.6 C) (Oral)  Ht 4\' 9"  (1.448 m)  Wt 233 lb 6 oz (105.858 kg)  BMI 50.49 kg/m2  SpO2 99% VS noted,  Constitutional: Pt is oriented to person, place, and time. Appears well-developed and well-nourished.  Head: Normocephalic and atraumatic.  Right Ear: External ear normal.  Left Ear: External ear normal.  Nose: Nose normal.  Mouth/Throat: Oropharynx is clear and moist.  Eyes: Conjunctivae and EOM are normal. Pupils are equal, round, and reactive to light.  Neck: Normal range of motion. Neck supple. No JVD present. No tracheal deviation present.  Cardiovascular: Normal rate, regular rhythm, normal heart sounds and intact distal pulses.   Pulmonary/Chest: Effort normal and breath sounds normal.  Abdominal: Soft. Bowel sounds are normal. There is unexpected significant tenderness to low mid abd without guarding. No HSM  Musculoskeletal: Normal range of motion. Exhibits no edema.  Lymphadenopathy:  Has no cervical adenopathy.  Neurological: Pt is alert and oriented to person, place, and time. Pt has normal reflexes. No cranial nerve deficit.  Spine:  Nontender, no swelling, redness, rash Skin: Skin is warm and dry. No rash noted. navel with whitish  rash Psychiatric:  Has  normal mood and affect. Behavior is normal.     Assessment & Plan:

## 2013-02-14 ENCOUNTER — Telehealth: Payer: Self-pay

## 2013-02-14 ENCOUNTER — Telehealth: Payer: Self-pay | Admitting: Internal Medicine

## 2013-02-14 MED ORDER — HYDROCHLOROTHIAZIDE 25 MG PO TABS: 25.0000 mg | ORAL_TABLET | Freq: Every day | ORAL | Status: DC

## 2013-02-14 MED ORDER — IRBESARTAN 300 MG PO TABS: 300.0000 mg | ORAL_TABLET | Freq: Every day | ORAL | Status: DC

## 2013-02-14 NOTE — Telephone Encounter (Signed)
Called the patient left a detailed message of information of medication change.

## 2013-02-14 NOTE — Telephone Encounter (Signed)
Sorry, ok to cont that as is  Zella Ball to adjust med list

## 2013-02-14 NOTE — Telephone Encounter (Signed)
Conception says she received a call from Zella Ball in the office about her Avalide and did not understand what she needed to do; informed Keiera that Avalide contained 2 meds: Avapro and Hctz and the combined med did mot come in the strength that she needed so they called in the two separate rx for those meds, Avapro and Hctz; Katherleen said she thought she was supposed to get the generic for Micardis and didn't understand why her meds were changing; said she would pick up the new rx

## 2013-02-14 NOTE — Telephone Encounter (Signed)
Called the patient and she just picked up (last week) a prescription of Irbesartan HCTZ 150/12.5 and has been taking it bid as instructed.  She is somewhat confused now as what to take.  Please advise

## 2013-02-14 NOTE — Telephone Encounter (Signed)
Please clarify Avalide as current strength available is 300/12.5

## 2013-02-14 NOTE — Telephone Encounter (Signed)
avalide not made in 300/25 mg strength  Ok to change to separate meds in 2 scripts  Done erx

## 2013-02-15 NOTE — Telephone Encounter (Signed)
Patient informed of MD instructions on medications.   

## 2013-02-15 NOTE — Telephone Encounter (Signed)
Adjusted med list per MD instructions and called the patient to inform.  The patient stated in the past she was taking Micardis 80 mg and now the irbesartan 150/12.5 bid,  She would like explained why an increase?

## 2013-02-15 NOTE — Telephone Encounter (Signed)
Called the patient left message to call back 

## 2013-02-15 NOTE — Telephone Encounter (Signed)
The medications are different and have different effects;  You cannot compare the number from one medication to the other; it comes down to how well it works at the dose given

## 2013-02-17 ENCOUNTER — Encounter: Payer: 59 | Admitting: Internal Medicine

## 2013-02-22 ENCOUNTER — Telehealth: Payer: Self-pay | Admitting: *Deleted

## 2013-02-22 NOTE — Telephone Encounter (Signed)
[facility] called to make MD aware Avalide 300-25mg  strength is not available-per 02/14/2013 phone note, rx has been changed to Avalide 150-12.5mg  1 BID. [facility] pharmacist informed.

## 2013-02-22 NOTE — Telephone Encounter (Signed)
Received message from patient about some postmenopausal bleeding and she started anastrozole in April.  Left message for a return phone call to discuss.

## 2013-02-23 ENCOUNTER — Telehealth: Payer: Self-pay | Admitting: *Deleted

## 2013-02-23 NOTE — Telephone Encounter (Signed)
This RN left message on VM for a return call per report from nurse covering that pt called with onset of menstrual bleeding and on arimidex.

## 2013-02-24 ENCOUNTER — Telehealth: Payer: Self-pay | Admitting: *Deleted

## 2013-02-24 NOTE — Telephone Encounter (Signed)
This RN spoke with pt today per onset of menstrauel bleeding post 1 year.  Bleeding started 5/7 and is overall  " normal for a period ".  Per discussion pt has not started on arimidex per prior visit.  This RN informed pt of need for GYN exam- pt sees Dr Dierdre Forth.  This RN called to Dr Lilian Coma at [facility]- spoke with appointment clerk who requested fax for appt for more urgent nature to show to Dr Lilian Coma assistance.

## 2013-03-15 ENCOUNTER — Telehealth: Payer: Self-pay | Admitting: Medical Oncology

## 2013-03-15 NOTE — Telephone Encounter (Signed)
Pt called stating that she has a [employer] form that she would like to have filled out. She asked for our fax number.Fax number given to the breast pod.

## 2013-03-21 ENCOUNTER — Ambulatory Visit: Payer: 59 | Admitting: Dietician

## 2013-04-26 ENCOUNTER — Other Ambulatory Visit (HOSPITAL_BASED_OUTPATIENT_CLINIC_OR_DEPARTMENT_OTHER): Payer: 59 | Admitting: Lab

## 2013-04-26 DIAGNOSIS — C50119 Malignant neoplasm of central portion of unspecified female breast: Secondary | ICD-10-CM

## 2013-04-26 DIAGNOSIS — C50912 Malignant neoplasm of unspecified site of left female breast: Secondary | ICD-10-CM

## 2013-04-26 LAB — FOLLICLE STIMULATING HORMONE: FSH: 8.7 m[IU]/mL

## 2013-04-26 LAB — LUTEINIZING HORMONE: LH: 5.4 m[IU]/mL

## 2013-05-02 LAB — ESTRADIOL, ULTRA SENS: Estradiol, Ultra Sensitive: 22 pg/mL

## 2013-05-10 ENCOUNTER — Encounter: Payer: Self-pay | Admitting: Physician Assistant

## 2013-05-10 ENCOUNTER — Ambulatory Visit (HOSPITAL_BASED_OUTPATIENT_CLINIC_OR_DEPARTMENT_OTHER): Payer: 59 | Admitting: Physician Assistant

## 2013-05-10 ENCOUNTER — Telehealth: Payer: Self-pay | Admitting: *Deleted

## 2013-05-10 VITALS — BP 120/81 | HR 67 | Temp 98.3°F | Resp 20 | Ht <= 58 in | Wt 218.5 lb

## 2013-05-10 DIAGNOSIS — Z853 Personal history of malignant neoplasm of breast: Secondary | ICD-10-CM

## 2013-05-10 DIAGNOSIS — N390 Urinary tract infection, site not specified: Secondary | ICD-10-CM

## 2013-05-10 DIAGNOSIS — N926 Irregular menstruation, unspecified: Secondary | ICD-10-CM

## 2013-05-10 DIAGNOSIS — C50912 Malignant neoplasm of unspecified site of left female breast: Secondary | ICD-10-CM

## 2013-05-10 DIAGNOSIS — C50919 Malignant neoplasm of unspecified site of unspecified female breast: Secondary | ICD-10-CM

## 2013-05-10 DIAGNOSIS — D509 Iron deficiency anemia, unspecified: Secondary | ICD-10-CM

## 2013-05-10 DIAGNOSIS — R911 Solitary pulmonary nodule: Secondary | ICD-10-CM

## 2013-05-10 MED ORDER — TAMOXIFEN CITRATE 20 MG PO TABS: 20.0000 mg | ORAL_TABLET | Freq: Every day | ORAL | Status: DC

## 2013-05-10 NOTE — Progress Notes (Signed)
ID: [name]   DOB: 11-Feb-1956  MR#: [id number]  NWG#:[id number]   PCP: Oliver Barre, MD GYN:  Dierdre Forth, MD SU:  Glenna Fellows, MD Other:  Lurline Hare, MD  HISTORY OF PRESENT ILLNESS: The patient had screening mammography in January 16, 2011 (she missed her mammogram on January 05, 2010) and this showed some changes in the left breast, which measured 1.1 cm.  Left breast ultrasound was obtained the same day and confirmed a small hypoechoic lobular area of nodularity measuring again 1.1 cm.  There was no other abnormality and the patient was set up for ultrasound-guided needle biopsy January 22, 2011.  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  Tumor was estrogen receptor positive at 82%, progesterone receptor positive at 92% with MIB-1 of 17% and no evidence of HER2 amplification, the ratio by CISH being 0.97.  With this information the patient was set up for bilateral breast MRI s and these were obtained January 29, 2011.  In the central left breast in the retroareolar location there was a spiculated enhancing mass with a regular margins measuring again 1.1 cm.  There were no other areas of abnormality.  She had her left lumpectomy and sentinel lymph node sampling on February 10, 2011.  The pathology report ([id number]) shows an 8-mm invasive ductal carcinoma, grade 1, involving 1/2 sentinel lymph nodes with a micrometastatic deposit.  The inferior margin was apparently broadly positive initially, but may have been cleared with subsequent excision in the same procedure.  The pathology report does not commit, but says the margin has been "likely cleared."   Her Oncotype DX gave her a recurrence score of 21%, predicting a risk of recurrent for node positive patients in the 12% range.  She decided to forego chemotherapy, but proceded with radiation therapy. She completed radiation in August 2012, and started on  tamoxifen the first week of September 2012. Her subsequent history is as detailed below.  INTERVAL HISTORY: [name] returns today for followup of her left breast cancer. When she was seen here in April, the decision was made to switch from tamoxifen to anastrozole. She had not yet had the prescription filled, and had a menstrual cycle in early May 2014.  Accordingly, she never started the anastrozole. She has continued on tamoxifen although she admits that she has "missed some days". She does need this refill today. She's also due for her annual pelvic exam with Dr. Penni Bombard, and in fact has not seen her in a little over one year.   [name] continues to be very anxious about her abdominal pain. CTs of the abdomen and pelvis in November of 2013 showing diverticulosis, a left-sided hernia, fibroid uterus, and a 3 mm nodule in the left lower lobe of the lung. She also feels a "lump" in her upper abdomen she is concerned about.  Review of Systems: [name] denies any recent illnesses and has had no fevers or chills. She has occasional hot flashes, but nothing problematic. She hasn't occasional white/clear vaginal discharge. She does notice urinary frequency with some hematuria, but no dysuria. Her energy level is low. She continues to have general muscle aches and pains as well as arthralgias, all of which are chronic. These especially affects the lower extremities. She has some occasional swelling in her feet and ankles. She denies any nausea, and is having regular bowel movements. She's had no cough, increased shortness of breath, or chest pain. No abnormal headaches  or dizziness.   A detailed review of systems is otherwise stable and noncontributory.    PAST MEDICAL HISTORY: Past Medical History  Diagnosis Date  . HYPERLIPIDEMIA 05/11/2008  . OBSTRUCTIVE SLEEP APNEA 05/11/2008  . RASH-NONVESICULAR 06/29/2008  . COLONIC POLYPS, HX OF 05/11/2008  . GENITAL HERPES, HX OF 08/08/2009  . Inverted nipple   .  Menorrhagia   . 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 LLQ pain   . Hernia   . Low iron   . H/O gonorrhea   . Bacterial infection   . Trichomonas   . H/O irritable bowel syndrome   . Breast cancer     2012  . Hypertension   . Arthritis   . Abscess of buttock   . Diabetes mellitus without complication   Is significant for hypertension, obesity, early glaucoma, removal of a right breast cysts in 1972, total left knee replacement in 2007, possible irritable bowel syndrome, history of fibroid uterus surgery in 2006, history of C-section in 1981 and history of remote repair of the right and left Achilles tendons.  PAST SURGICAL HISTORY: Past Surgical History  Procedure Laterality Date  . Cesarean section    . Right achilles tendon      and left  . S/p ear surgury    . Right ovarian cyst      hx  . S/p extra uterine fibroid  2006  . S/p left knee replacement  2007  . Dilation and curettage of uterus    . Uterine fibroid surgery  2006  . Breast cyst excision  1973  . Breast lumpectomy    . Axillary lymph node dissection      FAMILY HISTORY Family History  Problem Relation Age of Onset  . Diabetes Mother   . Hypertension Mother   . Stroke Mother   . Heart disease Father     COPD  . Alcohol abuse Father     ETOH dependence  . Colon cancer Neg Hx   The patient's mother is alive..  The patient's father died in his early 34s from congestive heart failure.  The patient had five brothers and three sisters; most of theses siblings are half siblings, but in any case there is no history of breast or ovarian cancer in the immediate family.  There was one maternal aunt (out of a total of four) diagnosed with breast cancer in her 28s.  GYNECOLOGIC HISTORY: The patient had menarche age 26, she is still having regular periods.  She is Gx, P1, first pregnancy to term at age 69.  She stopped having menstrual periods August of 2012, but had one anomalous. In April of 2013.  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  She has been divorced more than 10 years and lives by herself at home with no pets.  Her one child, a son, died at age 14.   ADVANCED DIRECTIVES: not in place  HEALTH MAINTENANCE: History  Substance Use Topics  . Smoking status: Never Smoker   . Smokeless tobacco: Never Used  . Alcohol Use: Yes     Comment: occasionally drinks wine     Colonoscopy:  PAP:  Bone density:  Lipid panel:  Allergies  Allergen Reactions  . Codeine   . Cymbalta (Duloxetine Hcl) Other (See Comments)    Head felt funny, ? thinking not right  . Darvon   . Propoxyphene Hcl     Current Outpatient Prescriptions  Medication Sig Dispense Refill  .  cholecalciferol (VITAMIN D) 400 UNITS TABS Take 1,000 Units by mouth daily.      . Ferrous Sulfate (IRON) 325 (65 FE) MG TABS Take by mouth daily.        . fish oil-omega-3 fatty acids 1000 MG capsule Take 1 g by mouth daily.      [name] ibuprofen (ADVIL,MOTRIN) 800 MG tablet Take 800 mg by mouth 3 (three) times daily.      . irbesartan-hydrochlorothiazide (AVALIDE) 150-12.5 MG per tablet Take 1 tablet by mouth 2 (two) times daily.      . metFORMIN (GLUCOPHAGE XR) 500 MG 24 hr tablet Take 1 tablet (500 mg total) by mouth daily with breakfast.  90 tablet  3  . Multiple Vitamin (MULTIVITAMIN) tablet Take 1 tablet by mouth daily.      [name] nystatin (MYCOSTATIN) powder Use asd twice per day  15 g  1  . valACYclovir (VALTREX) 1000 MG tablet Take 1 tablet (1,000 mg total) by mouth daily.  90 tablet  3  . vitamin C (ASCORBIC ACID) 500 MG tablet Take 500 mg by mouth daily.      . hyoscyamine (LEVSIN SL) 0.125 MG SL tablet Place 0.25 mg under the tongue every 4 (four) hours as needed.      . tamoxifen (NOLVADEX) 20 MG tablet Take 1 tablet (20 mg total) by mouth daily.  90 tablet  3  . [DISCONTINUED] tolterodine (DETROL LA) 4 MG 24 hr capsule Take 4 mg by mouth daily.       No current facility-administered medications  for this visit.    OBJECTIVE: Middle-aged [place] American woman in no acute distress Filed Vitals:   05/10/13 1529  BP: 120/81  Pulse: 67  Temp: 98.3 F (36.8 C)  Resp: 20     Body mass index is 47.27 kg/(m^2).    ECOG FS: 1 Filed Weights   05/10/13 1529  Weight: 218 lb 8 oz (99.111 kg)   Sclerae unicteric Oropharynx clear No cervical or supraclavicular adenopathy Lungs clear to auscultation, no rales or rhonchi Heart regular rate and rhythm Abdomen  Obese, soft, mild tenderness to palpation diffusely; there is a small palpable nodule in the central upper abdomen, just inferior to the sternum, slightly over 0.5 cm in diameter; positive bowel sounds MSK no focal spinal tenderness,  Nonpitting pedal edema, equal bilaterally. No upper extremity edema Neuro: nonfocal, well oriented, friendly affect Breasts: The right breast is unremarkable. The left breast is status post lumpectomy and radiation. There is no evidence of local. Axillae are benign bilaterally, no palpable adenopathy.   LAB RESULTS: Lab Results  Component Value Date   WBC 4.4 01/18/2013   NEUTROABS 2.5 01/18/2013   HGB 10.8* 01/18/2013   HCT 33.9* 01/18/2013   MCV 86.2 01/18/2013   PLT 213 01/18/2013      Chemistry      Component Value Date/Time   NA 140 01/18/2013 0909   NA 141 03/28/2012 1050   K 3.9 01/18/2013 0909   K 4.1 03/28/2012 1050   CL 106 01/18/2013 0909   CL 106 03/28/2012 1050   CO2 26 01/18/2013 0909   CO2 25 03/28/2012 1050   BUN 15.0 01/18/2013 0909   BUN 15 03/28/2012 1050   CREATININE 0.8 01/18/2013 0909   CREATININE 0.72 03/28/2012 1050      Component Value Date/Time   CALCIUM 9.3 01/18/2013 0909   CALCIUM 9.2 03/28/2012 1050   ALKPHOS 37* 01/18/2013 0909   ALKPHOS 32* 03/28/2012 1050  AST 15 01/18/2013 0909   AST 14 03/28/2012 1050   ALT 11 01/18/2013 0909   ALT 10 03/28/2012 1050   BILITOT 0.33 01/18/2013 0909   BILITOT 0.3 03/28/2012 1050       Lab Results  Component Value Date   LABCA2 19 10/24/2012      STUDIES:  Mammography at [facility] 01/09/2013 was stable.   CT scan of the abdomen and pelvis 08/31/2012 showed diverticulosis, a fibroid uterus, and a 3 mm nodule in the posterior left lower lobe which is felt to be a subpleural lymph node. Chest CT scan within a year was recommended.    ASSESSMENT: 57 y.o.  [place] woman   (1)  status post left lumpectomy and sentinel lymph node dissection April of 2012 for a T1b N1(mic) stage IB invasive ductal carcinoma, grade 1, estrogen receptor 82% and progesterone receptor 92% positive, with no HER-2 amplification, and an MIB-1-1 of 17%,   (2)  The patient's Oncotype DX score of 21 predicts a 13% risk of distant recurrence after 5 years of tamoxifen.  (3)  status post radiation completed August of 2012,   (4)  on tamoxifen from September of 2012 to April 2014  (5) the plan had been to initiate anastrozole in April 2014, but the patient had a menstrual cycle in May 2014, and resumed tamoxifen.   PLAN: Over half of our 40 minute appointment today was spent reviewing [name]'s concerns, answering questions, reviewing scan results, and coordinating care. With regards to her breast cancer, [name] appears to be doing well. We did review her results from her recent hormone levels, and she is not yet definitely postmenopausal at this time. She'll continue on tamoxifen which I have refilled. It is time for her to have a pelvic exam and evaluation with Dr. Pennie Rushing, and we will get that scheduled for her as well.  She does understand that it is possible that she still ovulating, which means she could possibly get pregnant. She would need to use barrier forms of birth control if the need arises.  We spent quite a bit of time today again reviewing her CT of the abdomen and pelvis from November 2013. She continues to have abdominal pain as noted above, and is concerned about the "nodule" she feels in her abdomen. We had already discussed obtaining a chest CT  to evaluate stability of the small lung nodule noted on the scan in November. This was reviewed with Dr. Darnelle Catalan, and we will go ahead and order CTs of the chest/abdomen/pelvis for early November to evaluate stability of the lung nodule, and also to evaluate chronic abdominal pain and assess stability of a known hernia.  She will continue to followup with Dr. Jonny Ruiz as well.  We will obtain a CBC and metabolic panel today as she leaves, especially to follow her anemia. We'll also obtain a urinalysis to evaluate for possible urinary tract infection in light of increased urinary frequency.  All this was reviewed in detail with [name] today, and she voices understanding and agreement with this plan. She knows to call with any changes prior to her next appointment.    [name]    05/10/2013

## 2013-05-10 NOTE — Telephone Encounter (Signed)
appts made and printed. gv pt appt for [facility] for 01/10/14@8 :15am, Dr. Pennie Rushing for 06/06/13@ 9:15am. Pt is aware that cs will call her w/ her appts for CT C/A/P...td

## 2013-05-12 ENCOUNTER — Other Ambulatory Visit (HOSPITAL_BASED_OUTPATIENT_CLINIC_OR_DEPARTMENT_OTHER): Payer: 59 | Admitting: Lab

## 2013-05-12 DIAGNOSIS — C50919 Malignant neoplasm of unspecified site of unspecified female breast: Secondary | ICD-10-CM

## 2013-05-12 DIAGNOSIS — C50912 Malignant neoplasm of unspecified site of left female breast: Secondary | ICD-10-CM

## 2013-05-12 DIAGNOSIS — N390 Urinary tract infection, site not specified: Secondary | ICD-10-CM

## 2013-05-12 LAB — CBC WITH DIFFERENTIAL/PLATELET
BASO%: 0.7 % (ref 0.0–2.0)
Basophils Absolute: 0 10*3/uL (ref 0.0–0.1)
EOS%: 1.7 % (ref 0.0–7.0)
Eosinophils Absolute: 0.1 10*3/uL (ref 0.0–0.5)
HCT: 33 % — ABNORMAL LOW (ref 34.8–46.6)
HGB: 10.8 g/dL — ABNORMAL LOW (ref 11.6–15.9)
LYMPH%: 26.9 % (ref 14.0–49.7)
MCH: 28.1 pg (ref 25.1–34.0)
MCHC: 32.7 g/dL (ref 31.5–36.0)
MCV: 85.9 fL (ref 79.5–101.0)
MONO#: 0.6 10*3/uL (ref 0.1–0.9)
MONO%: 15.4 % — ABNORMAL HIGH (ref 0.0–14.0)
NEUT#: 2.2 10*3/uL (ref 1.5–6.5)
NEUT%: 55.3 % (ref 38.4–76.8)
Platelets: 215 10*3/uL (ref 145–400)
RBC: 3.84 10*6/uL (ref 3.70–5.45)
RDW: 14.5 % (ref 11.2–14.5)
WBC: 4 10*3/uL (ref 3.9–10.3)
lymph#: 1.1 10*3/uL (ref 0.9–3.3)

## 2013-05-12 LAB — URINALYSIS, MICROSCOPIC - CHCC
Bilirubin (Urine): NEGATIVE
Glucose: NEGATIVE mg/dL
Ketones: NEGATIVE mg/dL
Leukocyte Esterase: NEGATIVE
Nitrite: NEGATIVE
Protein: NEGATIVE mg/dL
Specific Gravity, Urine: 1.025 (ref 1.003–1.035)
Urobilinogen, UR: 0.2 mg/dL (ref 0.2–1)
WBC, UA: NEGATIVE (ref 0–2)
pH: 5 (ref 4.6–8.0)

## 2013-05-12 LAB — COMPREHENSIVE METABOLIC PANEL (CC13)
ALT: 15 U/L (ref 0–55)
AST: 17 U/L (ref 5–34)
Albumin: 3.2 g/dL — ABNORMAL LOW (ref 3.5–5.0)
Alkaline Phosphatase: 35 U/L — ABNORMAL LOW (ref 40–150)
BUN: 16.4 mg/dL (ref 7.0–26.0)
CO2: 24 mEq/L (ref 22–29)
Calcium: 9.1 mg/dL (ref 8.4–10.4)
Chloride: 108 mEq/L (ref 98–109)
Creatinine: 0.8 mg/dL (ref 0.6–1.1)
Glucose: 92 mg/dl (ref 70–140)
Potassium: 3.8 mEq/L (ref 3.5–5.1)
Sodium: 143 mEq/L (ref 136–145)
Total Bilirubin: 0.24 mg/dL (ref 0.20–1.20)
Total Protein: 7.5 g/dL (ref 6.4–8.3)

## 2013-05-13 LAB — URINE CULTURE

## 2013-05-15 ENCOUNTER — Other Ambulatory Visit: Payer: Self-pay | Admitting: Physician Assistant

## 2013-05-15 ENCOUNTER — Telehealth: Payer: Self-pay

## 2013-05-15 DIAGNOSIS — R319 Hematuria, unspecified: Secondary | ICD-10-CM

## 2013-05-15 NOTE — Telephone Encounter (Signed)
Spoke with pt regarding lab results for 7/23. Per AB, labs ok. Still anemic but Hgb stable x 1 year. Urine showed blood but culture neg. GM recommends referral to urologist. Schedulers will call pt with that appt. Pt voiced understanding and knows to call the office with any questions. TMB

## 2013-05-15 NOTE — Progress Notes (Signed)
I again reviewed this case with Dr. Darnelle Catalan. Following Cuma's appointment here last week, we obtained a urinalysis and urine culture to evaluate urinary frequency. The urinalysis showed blood in the urine, and the urine culture was unremarkable. Dr. Darnelle Catalan has suggested a urology referral for additional evaluation.  Otherwise, we will keep our followup as scheduled, specifically with labs and CT scans on November 10, and a followup with me on November 17.  Zollie Scale, PA-C 05/15/2013

## 2013-05-16 ENCOUNTER — Telehealth: Payer: Self-pay | Admitting: Oncology

## 2013-06-06 ENCOUNTER — Telehealth: Payer: Self-pay

## 2013-06-06 DIAGNOSIS — IMO0001 Reserved for inherently not codable concepts without codable children: Secondary | ICD-10-CM

## 2013-06-06 NOTE — Telephone Encounter (Signed)
Patient informed to schedule appt.

## 2013-06-06 NOTE — Telephone Encounter (Signed)
Informed the patient of referral and MD instructions.  She stated she is having swelling and hearing her heart beat on her right side of neck. She changed pharmacies when getting the last refill on her BP med. And states the pills are different

## 2013-06-06 NOTE — Telephone Encounter (Signed)
Not sure what to make of this, consider OV with regina

## 2013-06-06 NOTE — Telephone Encounter (Signed)
The blood pressure medicine does not cause abnormal taste, so i would consider another reason for this.  Ok to take every day/.  OK for endo referral per pt request

## 2013-06-06 NOTE — Telephone Encounter (Signed)
Received phone call from the patient requesting a referral to an endocrinologist to monitor her diabetes.  She stated she thinks she should have been referred once diagnosed.   ALSO, the patient states she is having swelling, hearing her heart beat and having a bitter taste in her mouth after taking her BP medication.  She stated she is not taking it everyday because she believes it is the cause of these symptoms.  Please advise

## 2013-06-08 ENCOUNTER — Encounter: Payer: Self-pay | Admitting: Internal Medicine

## 2013-06-08 ENCOUNTER — Ambulatory Visit (INDEPENDENT_AMBULATORY_CARE_PROVIDER_SITE_OTHER): Payer: 59 | Admitting: Internal Medicine

## 2013-06-08 VITALS — BP 130/100 | HR 58 | Temp 98.1°F | Ht 59.0 in | Wt 222.2 lb

## 2013-06-08 DIAGNOSIS — Z0000 Encounter for general adult medical examination without abnormal findings: Secondary | ICD-10-CM

## 2013-06-08 DIAGNOSIS — F341 Dysthymic disorder: Secondary | ICD-10-CM

## 2013-06-08 DIAGNOSIS — IMO0001 Reserved for inherently not codable concepts without codable children: Secondary | ICD-10-CM

## 2013-06-08 DIAGNOSIS — F329 Major depressive disorder, single episode, unspecified: Secondary | ICD-10-CM

## 2013-06-08 DIAGNOSIS — F32A Depression, unspecified: Secondary | ICD-10-CM

## 2013-06-08 DIAGNOSIS — E785 Hyperlipidemia, unspecified: Secondary | ICD-10-CM

## 2013-06-08 MED ORDER — TELMISARTAN-HCTZ 80-12.5 MG PO TABS: 1.0000 | ORAL_TABLET | Freq: Every day | ORAL | Status: DC

## 2013-06-08 NOTE — Assessment & Plan Note (Signed)
stable overall by history and exam, recent data reviewed with pt, and pt to continue medical treatment as before,  to f/u any worsening symptoms or concerns Lab Results  Component Value Date   LDLCALC 86 02/08/2013

## 2013-06-08 NOTE — Assessment & Plan Note (Signed)
stable overall by history and exam, recent data reviewed with pt, and pt to continue medical treatment as before,  to f/u any worsening symptoms or concerns, plans to see endo soon as well, will hold off on further labs at this time

## 2013-06-08 NOTE — Patient Instructions (Signed)
OK to stop the irbesartanHCT Please take all new medication as prescribed - the generic for micardisHCT Please continue all other medications as before, and refills have been done if requested. Please have the pharmacy call with any other refills you may need. Please keep your appointments with your specialists as you have planned  Please remember to sign up for My Chart if you have not done so, as this will be important to you in the future with finding out test results, communicating by private email, and scheduling acute appointments online when needed.  Please return in 6 months, or sooner if needed, with Lab testing done 3-5 days before

## 2013-06-08 NOTE — Progress Notes (Signed)
Subjective:    Patient ID: [name], female    DOB: 13-Dec-1955, 57 y.o.   MRN: [id number]  HPI  Here to f/u, not taking the irbesartan/hct due to percieved bitterness with the pill itself, had some mild pedal edema with not taking recently, and has been aware of a whooshing sound in the right ear with taking. Pt denies chest pain, increased sob or doe, wheezing, orthopnea, PND, increased LE swelling, palpitations, dizziness or syncope.  Had done well with micardis hct but only recently became generic.    Has chronic bilat left > right knee pain, has been suggested for surgury including a rod to the left leg but she cont;s tpo defer for now. Quite stiff and hard to start walking after sitting for 10 min . Denies worsening depressive symptoms, suicidal ideation, or panic; has ongoing anxiety, not increased recently per pt Lost 11 lbs wit better diet since April 2014 Past Medical History  Diagnosis Date  . HYPERLIPIDEMIA 05/11/2008  . OBSTRUCTIVE SLEEP APNEA 05/11/2008  . RASH-NONVESICULAR 06/29/2008  . COLONIC POLYPS, HX OF 05/11/2008  . GENITAL HERPES, HX OF 08/08/2009  . Inverted nipple   . Menorrhagia   . 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 LLQ pain   . Hernia   . Low iron   . H/O gonorrhea   . Bacterial infection   . Trichomonas   . H/O irritable bowel syndrome   . Breast cancer     2012  . Hypertension   . Arthritis   . Abscess of buttock   . Diabetes mellitus without complication    Past Surgical History  Procedure Laterality Date  . Cesarean section    . Right achilles tendon      and left  . S/p ear surgury    . Right ovarian cyst      hx  . S/p extra uterine fibroid  2006  . S/p left knee replacement  2007  . Dilation and curettage of uterus    . Uterine fibroid surgery  2006  . Breast cyst excision  1973  . Breast lumpectomy    . Axillary lymph node dissection      reports that she has never smoked. She has never used smokeless tobacco. She reports that   drinks alcohol. She reports that she does not use illicit drugs. family history includes Alcohol abuse in her father; Diabetes in her mother; Heart disease in her father; Hypertension in her mother; Stroke in her mother. There is no history of Colon cancer. Allergies  Allergen Reactions  . Codeine   . Cymbalta [Duloxetine Hcl] Other (See Comments)    Head felt funny, ? thinking not right  . Darvon   . Propoxyphene Hcl    Current Outpatient Prescriptions on File Prior to Visit  Medication Sig Dispense Refill  . cholecalciferol (VITAMIN D) 400 UNITS TABS Take 1,000 Units by mouth daily.      . Ferrous Sulfate (IRON) 325 (65 FE) MG TABS Take by mouth daily.        . fish oil-omega-3 fatty acids 1000 MG capsule Take 1 g by mouth daily.      [name] ibuprofen (ADVIL,MOTRIN) 800 MG tablet Take 800 mg by mouth 3 (three) times daily.      . irbesartan-hydrochlorothiazide (AVALIDE) 150-12.5 MG per tablet Take 1 tablet by mouth 2 (two) times daily.      . metFORMIN (GLUCOPHAGE XR) 500 MG 24 hr tablet Take 1 tablet (500 mg total) by  mouth daily with breakfast.  90 tablet  3  . Multiple Vitamin (MULTIVITAMIN) tablet Take 1 tablet by mouth daily.      [name] nystatin (MYCOSTATIN) powder Use asd twice per day  15 g  1  . tamoxifen (NOLVADEX) 20 MG tablet Take 1 tablet (20 mg total) by mouth daily.  90 tablet  3  . valACYclovir (VALTREX) 1000 MG tablet Take 1 tablet (1,000 mg total) by mouth daily.  90 tablet  3  . vitamin C (ASCORBIC ACID) 500 MG tablet Take 500 mg by mouth daily.      . [DISCONTINUED] tolterodine (DETROL LA) 4 MG 24 hr capsule Take 4 mg by mouth daily.       No current facility-administered medications on file prior to visit.   Review of Systems  Constitutional: Negative for unexpected weight change, or unusual diaphoresis  HENT: Negative for tinnitus.   Eyes: Negative for photophobia and visual disturbance.  Respiratory: Negative for choking and stridor.   Gastrointestinal: Negative for  vomiting and blood in stool.  Genitourinary: Negative for hematuria and decreased urine volume.  Musculoskeletal: Negative for acute joint swelling Skin: Negative for color change and wound.  Neurological: Negative for tremors and numbness other than noted  Psychiatric/Behavioral: Negative for decreased concentration or  hyperactivity.       Objective:   Physical Exam BP 130/100  Pulse 58  Temp(Src) 98.1 F (36.7 C) (Oral)  Ht 4\' 11"  (1.499 m)  Wt 222 lb 4 oz (100.812 kg)  BMI 44.87 kg/m2  SpO2 99% VS noted,  Constitutional: Pt appears well-developed and well-nourished.  HENT: Head: NCAT.  Right Ear: External ear normal.  Left Ear: External ear normal.  Eyes: Conjunctivae and EOM are normal. Pupils are equal, round, and reactive to light.  Neck: Normal range of motion. Neck supple.  Cardiovascular: Normal rate and regular rhythm.   Pulmonary/Chest: Effort normal and breath sounds normal.  Neurological: Pt is alert. Not confused  Skin: Skin is warm. No erythema. Has trace edema to mid left left > right Psychiatric: Pt behavior is normal. Thought content normal.but irritable today, ? Mild dysphoric but pt denies     Assessment & Plan:

## 2013-06-08 NOTE — Assessment & Plan Note (Signed)
stable overall by history and exam, recent data reviewed with pt, and pt to continue medical treatment as before,  to f/u any worsening symptoms or concerns Lab Results  Component Value Date   WBC 4.0 05/12/2013   HGB 10.8* 05/12/2013   HCT 33.0* 05/12/2013   PLT 215 05/12/2013   GLUCOSE 92 05/12/2013   CHOL 173 02/08/2013   TRIG 106.0 02/08/2013   HDL 65.80 02/08/2013   LDLDIRECT 76.5 02/17/2012   LDLCALC 86 02/08/2013   ALT 15 05/12/2013   AST 17 05/12/2013   NA 143 05/12/2013   K 3.8 05/12/2013   CL 106 01/18/2013   CREATININE 0.8 05/12/2013   BUN 16.4 05/12/2013   CO2 24 05/12/2013   TSH 1.39 02/08/2013   HGBA1C 6.3 02/08/2013   MICROALBUR 1.1 02/08/2013

## 2013-06-22 ENCOUNTER — Ambulatory Visit: Payer: 59 | Admitting: Endocrinology

## 2013-06-26 ENCOUNTER — Encounter: Payer: Self-pay | Admitting: Endocrinology

## 2013-06-26 ENCOUNTER — Ambulatory Visit (INDEPENDENT_AMBULATORY_CARE_PROVIDER_SITE_OTHER): Payer: 59 | Admitting: Endocrinology

## 2013-06-26 ENCOUNTER — Other Ambulatory Visit: Payer: Self-pay

## 2013-06-26 VITALS — BP 134/78 | HR 68 | Ht 59.0 in | Wt 218.0 lb

## 2013-06-26 DIAGNOSIS — M25569 Pain in unspecified knee: Secondary | ICD-10-CM | POA: Insufficient documentation

## 2013-06-26 DIAGNOSIS — IMO0001 Reserved for inherently not codable concepts without codable children: Secondary | ICD-10-CM

## 2013-06-26 LAB — HEMOGLOBIN A1C: Hgb A1c MFr Bld: 6.1 % (ref 4.6–6.5)

## 2013-06-26 MED ORDER — CICLOPIROX 8 % EX SOLN: Freq: Every day | CUTANEOUS | Status: DC

## 2013-06-26 MED ORDER — CLOTRIMAZOLE-BETAMETHASONE 1-0.05 % EX CREA: TOPICAL_CREAM | Freq: Two times a day (BID) | CUTANEOUS | Status: DC

## 2013-06-26 NOTE — Patient Instructions (Addendum)
good diet and exercise habits significanly improve the control of your diabetes.  please let me know if you wish to be referred to a dietician, or for weight-loss surgery.  high blood sugar is very risky to your health.  you should see an eye doctor every year.  You are at higher than average risk for pneumonia and hepatitis-B.  You should be vaccinated against both.   controlling your blood pressure and cholesterol drastically reduces the damage diabetes does to your body.  this also applies to quitting smoking.  please discuss these with your doctor.  i have sent 2 prescriptions to your pharmacy: skin cream, and a pill for the toenail fungus.   Please come back for a follow-up appointment in 3-6 months. blood tests are being requested for you today.  We'll contact you with results. Let's test the nerves and muscles of the legs tested at a neurologist's office.  you will receive a phone call, about a day and time for an appointment

## 2013-06-26 NOTE — Progress Notes (Signed)
Subjective:    Patient ID: [name], female    DOB: Feb 25, 1956, 57 y.o.   MRN: [id number]  HPI pt states 8 months h/o dm.  she has moderate neuropathy of the lower extremities; she is unaware of any associated chronic complications.  she has never been on insulin.  pt says his diet and exercise are good.  She has moderate pain at the legs and feet, but no assoc numbness.  Past Medical History  Diagnosis Date  . HYPERLIPIDEMIA 05/11/2008  . OBSTRUCTIVE SLEEP APNEA 05/11/2008  . RASH-NONVESICULAR 06/29/2008  . COLONIC POLYPS, HX OF 05/11/2008  . GENITAL HERPES, HX OF 08/08/2009  . Inverted nipple   . Menorrhagia   . 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 LLQ pain   . Hernia   . Low iron   . H/O gonorrhea   . Bacterial infection   . Trichomonas   . H/O irritable bowel syndrome   . Breast cancer     2012  . Hypertension   . Arthritis   . Abscess of buttock   . Diabetes mellitus without complication     Past Surgical History  Procedure Laterality Date  . Cesarean section    . Right achilles tendon      and left  . S/p ear surgury    . Right ovarian cyst      hx  . S/p extra uterine fibroid  2006  . S/p left knee replacement  2007  . Dilation and curettage of uterus    . Uterine fibroid surgery  2006  . Breast cyst excision  1973  . Breast lumpectomy    . Axillary lymph node dissection      History   Social History  . Marital Status: Divorced    Spouse Name: N/A    Number of Children: 0  . 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 Smoking status: Never Smoker   . Smokeless tobacco: Never Used  . Alcohol Use: Yes     Comment: occasionally drinks wine  . Drug Use: No  . Sexual Activity: Not Currently   Other Topics Concern  . Not on file   Social History Narrative   1 son deceased with homicide   Patient has been divorced twice    Current Outpatient Prescriptions on File  Prior to Visit  Medication Sig Dispense Refill  . cholecalciferol (VITAMIN D) 400 UNITS TABS Take 1,000 Units by mouth daily.      . Ferrous Sulfate (IRON) 325 (65 FE) MG TABS Take by mouth daily.        . fish oil-omega-3 fatty acids 1000 MG capsule Take 1 g by mouth daily.      [name] ibuprofen (ADVIL,MOTRIN) 800 MG tablet Take 800 mg by mouth 3 (three) times daily.      . irbesartan-hydrochlorothiazide (AVALIDE) 150-12.5 MG per tablet Take 1 tablet by mouth 2 (two) times daily.      . metFORMIN (GLUCOPHAGE XR) 500 MG 24 hr tablet Take 1 tablet (500 mg total) by mouth daily with breakfast.  90 tablet  3  . Multiple Vitamin (MULTIVITAMIN) tablet Take 1 tablet by mouth daily.      [name] nystatin (MYCOSTATIN) powder Use asd twice per day  15 g  1  . tamoxifen (NOLVADEX) 20 MG tablet Take 1 tablet (20 mg total) by mouth daily.  90 tablet  3  . telmisartan-hydrochlorothiazide (  MICARDIS HCT) 80-12.5 MG per tablet Take 1 tablet by mouth daily.  90 tablet  3  . valACYclovir (VALTREX) 1000 MG tablet Take 1 tablet (1,000 mg total) by mouth daily.  90 tablet  3  . vitamin C (ASCORBIC ACID) 500 MG tablet Take 500 mg by mouth daily.      . [DISCONTINUED] tolterodine (DETROL LA) 4 MG 24 hr capsule Take 4 mg by mouth daily.       No current facility-administered medications on file prior to visit.    Allergies  Allergen Reactions  . Codeine   . Cymbalta [Duloxetine Hcl] Other (See Comments)    Head felt funny, ? thinking not right  . Darvon   . Propoxyphene Hcl     Family History  Problem Relation Age of Onset  . Diabetes Mother   . Hypertension Mother   . Stroke Mother   . Heart disease Father     COPD  . Alcohol abuse Father     ETOH dependence  . Colon cancer Neg Hx     BP 134/78  Pulse 68  Ht 4\' 11"  (1.499 m)  Wt 218 lb (98.884 kg)  BMI 44.01 kg/m2  SpO2 98%    Review of Systems denies fever, blurry vision, headache, chest pain, sob, n/v, menopausal sxs, memory loss, depression, and  rhinorrhea.  She has lost weight.  She has urinary frequency, easy bruising, excessive diaphoresis, and leg cramps.      Objective:   Physical Exam VS: see vs page GEN: no distress HEAD: head: no deformity She wears a wig. eyes: no periorbital swelling, no proptosis external nose and ears are normal mouth: no lesion seen NECK: supple, thyroid is not enlarged CHEST WALL: no deformity LUNGS:  Clear to auscultation.   CV: reg rate and rhythm, no murmur ABD: abdomen is soft, nontender.  no hepatosplenomegaly.  not distended.  no hernia MUSCULOSKELETAL: muscle bulk and strength are grossly normal.  no obvious joint swelling.  gait is normal and steady PULSES: no carotid bruit NEURO:  cn 2-12 grossly intact.   readily moves all 4's. SKIN:  Normal texture and temperature.  No rash or suspicious lesion is visible.   NODES:  None palpable at the neck PSYCH: alert, oriented x3.  Does not appear anxious nor depressed.   Lab Results  Component Value Date   HGBA1C 6.1 06/26/2013      Assessment & Plan:  DM: well-controlled.  We discussed the nine oral agents available for type 2 diabetes.  This regimen gives the best risk-benefit ratio.   Edema: this precludes the use of actos. Fibromyalgia: this limits the exercise rx of DM.

## 2013-07-03 ENCOUNTER — Other Ambulatory Visit: Payer: Self-pay | Admitting: Endocrinology

## 2013-07-03 DIAGNOSIS — M25569 Pain in unspecified knee: Secondary | ICD-10-CM

## 2013-07-19 ENCOUNTER — Encounter (INDEPENDENT_AMBULATORY_CARE_PROVIDER_SITE_OTHER): Payer: Self-pay

## 2013-07-19 ENCOUNTER — Ambulatory Visit (INDEPENDENT_AMBULATORY_CARE_PROVIDER_SITE_OTHER): Payer: 59 | Admitting: Neurology

## 2013-07-19 DIAGNOSIS — M79606 Pain in leg, unspecified: Secondary | ICD-10-CM | POA: Insufficient documentation

## 2013-07-19 DIAGNOSIS — M79609 Pain in unspecified limb: Secondary | ICD-10-CM

## 2013-07-19 NOTE — Procedures (Signed)
    [facility]  NCS (NERVE CONDUCTION STUDY) WITH EMG (ELECTROMYOGRAPHY) REPORT   STUDY DATE: 07/19/2013 PATIENT NAME: [name] DOB: 12/11/1955 MRN: [id number]    TECHNOLOGIST: Gearldine Shown ELECTROMYOGRAPHER: Levert Feinstein M.D.  CLINICAL INFORMATION:   57 years old right-handed Caucasian female, with past medical history of left knee replacement, bilateral Achilles tendon rupture, repair surgery, obesity, lumbar stenosis, presenting with bilateral lower extremity pain, especially after bearing weight  Examination: Bilateral lower extremity motor strength was normal, sensory examination was preserved to light touch and pinprick, deep tendon reflexes were hypoactive and symmetric  FINDINGS: NERVE CONDUCTION STUDY: Bilateral peroneal sensory responses were normal. Bilateral peroneal to EDB, and tibial motor responses were normal. Bilateral tibial H. reflexes were normal and symmetric.     NEEDLE ELECTROMYOGRAPHY: Selected needle examination was performed at right lower extremity muscles, and right lumbosacral paraspinal muscles.  Needle examination of right tibialis anterior, tibialis posterior, medial gastrocnemius, vastus lateralis, biceps femoris long head was normal  There was no spontaneous activity at the right lumbosacral paraspinal muscles, right L4, L5, S1.  IMPRESSION:  This is a normal study. There is no electrodiagnostic evidence of large fiber peripheral neuropathy, myopathy, or right lumbar radiculopathy.    INTERPRETING PHYSICIAN:   Levert Feinstein M.D. Ph.D. [facility] [address] [place], [place] [address] [phone]

## 2013-08-09 ENCOUNTER — Ambulatory Visit: Payer: 59 | Admitting: Internal Medicine

## 2013-08-24 ENCOUNTER — Encounter: Payer: Self-pay | Admitting: Oncology

## 2013-08-24 ENCOUNTER — Other Ambulatory Visit: Payer: Self-pay

## 2013-08-24 NOTE — Progress Notes (Signed)
Auth for ct cap [phone] 08/24/13-10/08/13

## 2013-08-24 NOTE — Progress Notes (Signed)
Auth for ct cap [id number] 08/24/13-10/08/13 °

## 2013-08-28 ENCOUNTER — Ambulatory Visit (HOSPITAL_COMMUNITY)
Admission: RE | Admit: 2013-08-28 | Discharge: 2013-08-28 | Disposition: A | Discharge status: Home / Self Care | Payer: 59 | Source: Ambulatory Visit | Attending: Physician Assistant | Admitting: Physician Assistant

## 2013-08-28 ENCOUNTER — Encounter (HOSPITAL_COMMUNITY): Payer: Self-pay

## 2013-08-28 ENCOUNTER — Other Ambulatory Visit (HOSPITAL_BASED_OUTPATIENT_CLINIC_OR_DEPARTMENT_OTHER): Payer: 59

## 2013-08-28 DIAGNOSIS — R911 Solitary pulmonary nodule: Secondary | ICD-10-CM

## 2013-08-28 DIAGNOSIS — C50912 Malignant neoplasm of unspecified site of left female breast: Secondary | ICD-10-CM

## 2013-08-28 DIAGNOSIS — C50119 Malignant neoplasm of central portion of unspecified female breast: Secondary | ICD-10-CM

## 2013-08-28 DIAGNOSIS — C50919 Malignant neoplasm of unspecified site of unspecified female breast: Secondary | ICD-10-CM | POA: Insufficient documentation

## 2013-08-28 DIAGNOSIS — Z923 Personal history of irradiation: Secondary | ICD-10-CM | POA: Insufficient documentation

## 2013-08-28 DIAGNOSIS — Z79810 Long term (current) use of selective estrogen receptor modulators (SERMs): Secondary | ICD-10-CM | POA: Insufficient documentation

## 2013-08-28 DIAGNOSIS — K439 Ventral hernia without obstruction or gangrene: Secondary | ICD-10-CM | POA: Insufficient documentation

## 2013-08-28 DIAGNOSIS — D509 Iron deficiency anemia, unspecified: Secondary | ICD-10-CM

## 2013-08-28 DIAGNOSIS — R0602 Shortness of breath: Secondary | ICD-10-CM | POA: Insufficient documentation

## 2013-08-28 LAB — CBC WITH DIFFERENTIAL/PLATELET
BASO%: 0.8 % (ref 0.0–2.0)
Basophils Absolute: 0 10*3/uL (ref 0.0–0.1)
EOS%: 1.3 % (ref 0.0–7.0)
Eosinophils Absolute: 0.1 10*3/uL (ref 0.0–0.5)
HCT: 35.2 % (ref 34.8–46.6)
HGB: 11.5 g/dL — ABNORMAL LOW (ref 11.6–15.9)
LYMPH%: 25.2 % (ref 14.0–49.7)
MCH: 27.9 pg (ref 25.1–34.0)
MCHC: 32.5 g/dL (ref 31.5–36.0)
MCV: 85.8 fL (ref 79.5–101.0)
MONO#: 0.6 10*3/uL (ref 0.1–0.9)
MONO%: 9.5 % (ref 0.0–14.0)
NEUT#: 3.7 10*3/uL (ref 1.5–6.5)
NEUT%: 63.2 % (ref 38.4–76.8)
Platelets: 237 10*3/uL (ref 145–400)
RBC: 4.1 10*6/uL (ref 3.70–5.45)
RDW: 14.7 % — ABNORMAL HIGH (ref 11.2–14.5)
WBC: 5.8 10*3/uL (ref 3.9–10.3)
lymph#: 1.5 10*3/uL (ref 0.9–3.3)

## 2013-08-28 LAB — COMPREHENSIVE METABOLIC PANEL (CC13)
ALT: 15 U/L (ref 0–55)
AST: 22 U/L (ref 5–34)
Albumin: 3.3 g/dL — ABNORMAL LOW (ref 3.5–5.0)
Alkaline Phosphatase: 41 U/L (ref 40–150)
Anion Gap: 11 mEq/L (ref 3–11)
BUN: 11.2 mg/dL (ref 7.0–26.0)
CO2: 25 mEq/L (ref 22–29)
Calcium: 9.6 mg/dL (ref 8.4–10.4)
Chloride: 103 mEq/L (ref 98–109)
Creatinine: 0.7 mg/dL (ref 0.6–1.1)
Glucose: 93 mg/dl (ref 70–140)
Potassium: 4 mEq/L (ref 3.5–5.1)
Sodium: 140 mEq/L (ref 136–145)
Total Bilirubin: 0.32 mg/dL (ref 0.20–1.20)
Total Protein: 7.8 g/dL (ref 6.4–8.3)

## 2013-08-28 MED ORDER — IOHEXOL 300 MG/ML  SOLN
125.0000 mL | Freq: Once | INTRAMUSCULAR | Status: AC | PRN
  Administered 2013-08-28: 125 mL via INTRAVENOUS

## 2013-09-04 ENCOUNTER — Ambulatory Visit (HOSPITAL_BASED_OUTPATIENT_CLINIC_OR_DEPARTMENT_OTHER): Payer: 59 | Admitting: Physician Assistant

## 2013-09-04 ENCOUNTER — Encounter: Payer: Self-pay | Admitting: Physician Assistant

## 2013-09-04 ENCOUNTER — Telehealth: Payer: Self-pay | Admitting: *Deleted

## 2013-09-04 VITALS — BP 135/72 | HR 65 | Temp 98.4°F | Resp 18 | Ht 59.0 in | Wt 226.6 lb

## 2013-09-04 DIAGNOSIS — Z853 Personal history of malignant neoplasm of breast: Secondary | ICD-10-CM

## 2013-09-04 DIAGNOSIS — M549 Dorsalgia, unspecified: Secondary | ICD-10-CM

## 2013-09-04 DIAGNOSIS — C50912 Malignant neoplasm of unspecified site of left female breast: Secondary | ICD-10-CM

## 2013-09-04 DIAGNOSIS — N6310 Unspecified lump in the right breast, unspecified quadrant: Secondary | ICD-10-CM

## 2013-09-04 DIAGNOSIS — Z170 Estrogen receptor positive status [ER+]: Secondary | ICD-10-CM

## 2013-09-04 DIAGNOSIS — R232 Flushing: Secondary | ICD-10-CM | POA: Insufficient documentation

## 2013-09-04 DIAGNOSIS — R21 Rash and other nonspecific skin eruption: Secondary | ICD-10-CM

## 2013-09-04 DIAGNOSIS — T451X5A Adverse effect of antineoplastic and immunosuppressive drugs, initial encounter: Secondary | ICD-10-CM | POA: Insufficient documentation

## 2013-09-04 DIAGNOSIS — N63 Unspecified lump in unspecified breast: Secondary | ICD-10-CM

## 2013-09-04 DIAGNOSIS — C50119 Malignant neoplasm of central portion of unspecified female breast: Secondary | ICD-10-CM

## 2013-09-04 MED ORDER — CLOTRIMAZOLE-BETAMETHASONE 1-0.05 % EX CREA: TOPICAL_CREAM | Freq: Two times a day (BID) | CUTANEOUS | Status: DC

## 2013-09-04 NOTE — Telephone Encounter (Signed)
appts made and printed. gv appts for [name]. Also gv pt the information for Dr. Emily Filbert. Pt will call and make an appt...td

## 2013-09-04 NOTE — Progress Notes (Signed)
ID: [name]   DOB: 17-Jan-1956  MR#: [id number]  WUJ#:[id number]   PCP: Oliver Barre, MD GYN:  Dierdre Forth, MD SU:  Glenna Fellows, MD Other:  Lurline Hare, MD;  Elmon Else, MD;  Romero Belling, MD;  Su Grand, MD;  Melvia Heaps, MD  CHIEF COMPLAINT:  Left Breast Cancer   HISTORY OF PRESENT ILLNESS: The patient had screening mammography in January 16, 2011 (she missed her mammogram on January 05, 2010) and this showed some changes in the left breast, which measured 1.1 cm.  Left breast ultrasound was obtained the same day and confirmed a small hypoechoic lobular area of nodularity measuring again 1.1 cm.  There was no other abnormality and the patient was set up for ultrasound-guided needle biopsy January 22, 2011.  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  Tumor was estrogen receptor positive at 82%, progesterone receptor positive at 92% with MIB-1 of 17% and no evidence of HER2 amplification, the ratio by CISH being 0.97.  With this information the patient was set up for bilateral breast MRI s and these were obtained January 29, 2011.  In the central left breast in the retroareolar location there was a spiculated enhancing mass with a regular margins measuring again 1.1 cm.  There were no other areas of abnormality.  She had her left lumpectomy and sentinel lymph node sampling on February 10, 2011.  The pathology report ([id number]) shows an 8-mm invasive ductal carcinoma, grade 1, involving 1/2 sentinel lymph nodes with a micrometastatic deposit.  The inferior margin was apparently broadly positive initially, but may have been cleared with subsequent excision in the same procedure.  The pathology report does not commit, but says the margin has been "likely cleared."   Her Oncotype DX gave her a recurrence score of 21%, predicting a risk of recurrent for node positive patients in the 12% range.  She decided to forego  chemotherapy, but proceded with radiation therapy. She completed radiation in August 2012, and started on tamoxifen the first week of September 2012. Her subsequent history is as detailed below.  INTERVAL HISTORY: [name] returns alone today for followup of her left breast cancer.  She continues on tamoxifen. She's had no vaginal bleeding since her menstrual cycle in early May 2014. She does have some hot flashes which she "tolerates". She also tells me she had her pelvic exam with Dr. Pennie Rushing following her visit here in July.  [name] is status post restaging CTs of the chest/abdomen/pelvis earlier this month, all of which were negative for evidence of metastatic disease. She is concerned because she has noticed what she feels like is a larger area of thickening or a possible mass in the upper outer quadrant of the right breast. She has had some thickening in this area in the past, but definitely feels like it has enlarged. It is tender to palpation.   REVIEW OF SYSTEMS: [name] denies any recent illnesses and has had no fevers or chills. Her energy level is poor. She's eating and drinking well, and denies any problems with nausea or emesis. She has had no change in bowel habits. She continues to have some problems with urinary incontinence and has difficulty emptying her bladder. She tells me this has been evaluated by Dr. Brunilda Payor, although they were never able to find a cause for these complaints. She denies any dysuria or hematuria.  She has general joint pain and also has chronic back pain,  all of which she attributes to arthritis. This sometimes causes difficulty with walking. She also has some shortness of breath with exertion and notices an occasional irregular heartbeat.  She's had no cough or phlegm production. She denies any chest pain or pressure.  No abnormal headaches or dizziness.  She has a chronic rash around her navel, the skin being hyperpigmented, thickened, dry, and cracking. She has seen Dr.  Emily Filbert in the past for dermatological complaints.  A detailed review of systems is otherwise stable and noncontributory.    PAST MEDICAL HISTORY: Past Medical History  Diagnosis Date  . HYPERLIPIDEMIA 05/11/2008  . OBSTRUCTIVE SLEEP APNEA 05/11/2008  . RASH-NONVESICULAR 06/29/2008  . COLONIC POLYPS, HX OF 05/11/2008  . GENITAL HERPES, HX OF 08/08/2009  . Inverted nipple   . Menorrhagia   . 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 LLQ pain   . Hernia   . Low iron   . H/O gonorrhea   . Bacterial infection   . Trichomonas   . H/O irritable bowel syndrome   . Breast cancer     2012  . Hypertension   . Arthritis   . Abscess of buttock   . Diabetes mellitus without complication   . Allergy   Is significant for hypertension, obesity, early glaucoma, removal of a right breast cysts in 1972, total left knee replacement in 2007, possible irritable bowel syndrome, history of fibroid uterus surgery in 2006, history of C-section in 1981 and history of remote repair of the right and left Achilles tendons.  PAST SURGICAL HISTORY: Past Surgical History  Procedure Laterality Date  . Cesarean section    . Right achilles tendon      and left  . S/p ear surgury    . Right ovarian cyst      hx  . S/p extra uterine fibroid  2006  . S/p left knee replacement  2007  . Dilation and curettage of uterus    . Uterine fibroid surgery  2006  . Breast cyst excision  1973  . Breast lumpectomy    . Axillary lymph node dissection      FAMILY HISTORY Family History  Problem Relation Age of Onset  . Diabetes Mother   . Hypertension Mother   . Stroke Mother   . Heart disease Father     COPD  . Alcohol abuse Father     ETOH dependence  . Colon cancer Neg Hx   The patient's mother is alive..  The patient's father died in his early 44s from congestive heart failure.  The patient had five brothers and three sisters; most of theses siblings are half siblings, but in any case there is no history of breast  or ovarian cancer in the immediate family.  There was one maternal aunt (out of a total of four) diagnosed with breast cancer in her 47s.  GYNECOLOGIC HISTORY: The patient had menarche age 66, she is still having regular periods.  She is Gx, P1, first pregnancy to term at age 72.  She stopped having menstrual periods August of 2012, but had one anomalous. In April of 2013.  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  She has been divorced more than 10 years and lives by herself at home with no pets.  Her one child, a son, died at age 45.   ADVANCED DIRECTIVES: not in place  HEALTH MAINTENANCE: (Updated 09/04/2013) History  Substance Use Topics  . Smoking status: Never Smoker   .  Smokeless tobacco: Never Used  . Alcohol Use: Yes     Comment: occasionally drinks wine     Colonoscopy: May 2013, Dr. Arlyce Dice  PAP: UTD/Dr. Pennie Rushing  Bone density: Not on file  Lipid panel: April 2014, Dr. Jonny Ruiz   Allergies  Allergen Reactions  . Codeine   . Cymbalta [Duloxetine Hcl] Other (See Comments)    Head felt funny, ? thinking not right  . Darvon   . Propoxyphene Hcl     Current Outpatient Prescriptions  Medication Sig Dispense Refill  . cholecalciferol (VITAMIN D) 400 UNITS TABS Take 1,000 Units by mouth daily.      . ciclopirox (PENLAC) 8 % solution Apply topically at bedtime. Apply over nail and surrounding skin. Apply daily over previous coat. After seven (7) days, may remove with alcohol and continue cycle.  6.6 mL  11  . clotrimazole-betamethasone (LOTRISONE) cream Apply topically 2 (two) times daily.  45 g  1  . Ferrous Sulfate (IRON) 325 (65 FE) MG TABS Take by mouth daily.        . fish oil-omega-3 fatty acids 1000 MG capsule Take 1 g by mouth daily.      [name] ibuprofen (ADVIL,MOTRIN) 800 MG tablet Take 800 mg by mouth 3 (three) times daily.      . metFORMIN (GLUCOPHAGE XR) 500 MG 24 hr tablet Take 1 tablet (500 mg total) by mouth daily with breakfast.  90 tablet   3  . Multiple Vitamin (MULTIVITAMIN) tablet Take 1 tablet by mouth daily.      [name] nystatin (MYCOSTATIN) powder Use asd twice per day  15 g  1  . tamoxifen (NOLVADEX) 20 MG tablet Take 1 tablet (20 mg total) by mouth daily.  90 tablet  3  . telmisartan-hydrochlorothiazide (MICARDIS HCT) 80-12.5 MG per tablet Take 1 tablet by mouth daily.  90 tablet  3  . valACYclovir (VALTREX) 1000 MG tablet Take 1 tablet (1,000 mg total) by mouth daily.  90 tablet  3  . vitamin C (ASCORBIC ACID) 500 MG tablet Take 500 mg by mouth daily.      . [DISCONTINUED] tolterodine (DETROL LA) 4 MG 24 hr capsule Take 4 mg by mouth daily.       No current facility-administered medications for this visit.    OBJECTIVE: Middle-aged [place] American woman in no acute distress Filed Vitals:   09/04/13 0958  BP: 135/72  Pulse: 65  Temp: 98.4 F (36.9 C)  Resp: 18     Body mass index is 45.74 kg/(m^2).    ECOG FS: 1 Filed Weights   09/04/13 0958  Weight: 226 lb 9.6 oz (102.785 kg)   Physical Exam: HEENT:  Sclerae anicteric.  Oropharynx clear. Buccal mucosa is pink and moist. NODES:  No cervical or supraclavicular lymphadenopathy palpated.  BREAST EXAM:  In the right breast, there is an area of thickening in the upper outer quadrant, measuring approximately 2 cm in diameter. There is slight tenderness to palpation of the area. There no skin changes, no nipple inversion, no evidence of nipple discharge. Left breast is status post lumpectomy, mildly tender to palpation, but with no evidence of disease recurrence. Axillae are benign bilaterally, no palpable lymphadenopathy. LUNGS:  Clear to auscultation bilaterally.  No wheezes or rhonchi HEART:  Regular rate and rhythm. No murmur  ABDOMEN:  Soft, obese, nontender.  Positive bowel sounds.  MSK:  No focal spinal tenderness to palpation. Full range of motion in the upper extremities. EXTREMITIES:  No peripheral edema.  SKIN: There is a rash surrounding a naval, the skin  being hyperpigmented, thickened, dry, and cracking in appearance. NEURO:  Nonfocal. Well oriented.  Appropriate affect.    LAB RESULTS: Lab Results  Component Value Date   WBC 5.8 08/28/2013   NEUTROABS 3.7 08/28/2013   HGB 11.5* 08/28/2013   HCT 35.2 08/28/2013   MCV 85.8 08/28/2013   PLT 237 08/28/2013      Chemistry      Component Value Date/Time   NA 140 08/28/2013 0909   NA 141 03/28/2012 1050   K 4.0 08/28/2013 0909   K 4.1 03/28/2012 1050   CL 106 01/18/2013 0909   CL 106 03/28/2012 1050   CO2 25 08/28/2013 0909   CO2 25 03/28/2012 1050   BUN 11.2 08/28/2013 0909   BUN 15 03/28/2012 1050   CREATININE 0.7 08/28/2013 0909   CREATININE 0.72 03/28/2012 1050      Component Value Date/Time   CALCIUM 9.6 08/28/2013 0909   CALCIUM 9.2 03/28/2012 1050   ALKPHOS 41 08/28/2013 0909   ALKPHOS 32* 03/28/2012 1050   AST 22 08/28/2013 0909   AST 14 03/28/2012 1050   ALT 15 08/28/2013 0909   ALT 10 03/28/2012 1050   BILITOT 0.32 08/28/2013 0909   BILITOT 0.3 03/28/2012 1050        STUDIES:  Mammography at [facility] 01/09/2013 was stable.    CT scan of the abdomen and pelvis 08/31/2012 showed diverticulosis, a fibroid uterus, and a 3 mm nodule in the posterior left lower lobe which is felt to be a subpleural lymph node. Chest CT scan within a year was recommended.   Ct Chest Abdomen Pelvis W Contrast  08/28/2013   CLINICAL DATA:  History of breast cancer diagnosed 2012. Currently taking tamoxifen. Completed radiation therapy. Shortness of breath. Followup left lower lobe 3 mm pulmonary nodule.  EXAM: CT CHEST, ABDOMEN, AND PELVIS WITH CONTRAST  TECHNIQUE: Multidetector CT imaging of the chest, abdomen and pelvis was performed following the standard protocol during bolus administration of intravenous contrast.  CONTRAST:  OMNIPAQUE IOHEXOL 300 MG/ML  SOLN  COMPARISON:  CT abdomen/pelvis 08/31/2012. No prior CT chest is available for direct comparison.  FINDINGS: CT CHEST FINDINGS   The previously seen left lower lobe pulmonary nodule is no longer identified. The lungs are clear. No pleural effusion or pericardial effusion. Central airways are patent.  Left axillary clips are in place. Probable right upper outer quadrant intramammary lymph node image 20. Left-sided lumpectomy changes are noted. Small AP window lymph nodes are identified, largest 5 mm in maximal short axis diameter. Great vessels are normal in caliber. Heart size is normal.  CT ABDOMEN AND PELVIS FINDINGS  Liver, gallbladder, adrenal glands, kidneys, spleen, and pancreas are normal. No ascites or lymphadenopathy. Small retroperitoneal lymph nodes are stable, largest measuring 5 mm in the aortocaval area image 58, for example.  No bowel wall thickening or focal segmental dilatation. Normal appendix. Fat containing umbilical and left Spigelian hernia reidentified without evidence for bowel entrapment or obstruction. Lobular uterine contour is stable. Ovaries are normal. The bladder is decompressed but unremarkable. No radiopaque renal or ureteral calculus.  Degenerative changes are noted in the spine. Sclerosis about the symphysis pubis is also most likely degenerative and stable. No new lytic or sclerotic osseous lesion or acute abnormality.  IMPRESSION: Interval resolution of left lower lobe pulmonary nodule. No new acute intrapulmonary finding or evidence for thoracic metastatic disease.  No acute intra-abdominal or pelvic pathology.  No evidence for intra-abdominal or pelvic metastatic disease.   Electronically Signed   By: Christiana Pellant M.D.   On: 08/28/2013 12:05      ASSESSMENT: 57 y.o.  [name] woman   (1)  status post left lumpectomy and sentinel lymph node dissection April of 2012 for a T1b N1(mic) stage IB invasive ductal carcinoma, grade 1, estrogen receptor 82% and progesterone receptor 92% positive, with no HER-2 amplification, and an MIB-1-1 of 17%,   (2)  The patient's Oncotype DX score of 21  predicts a 13% risk of distant recurrence after 5 years of tamoxifen.  (3)  status post radiation completed August of 2012,   (4)  on tamoxifen from September of 2012 to April 2014  (5) the plan had been to initiate anastrozole in April 2014, but the patient had a menstrual cycle in May 2014, and resumed tamoxifen.   PLAN: I am referring Yarixa back to [facility] for a diagnostic right mammogram and right ultrasound to evaluate the area of thickening in the upper outer quadrant of the right breast. Currently, the plan is to see her back in 6 months, but certainly we will see her sooner if these studies showed any abnormalities.   We did review the results of her recent CTs together today, and these are detailed above. Fortunately, there was no sign of metastatic disease noted. Dr. Darnelle Catalan has also reviewed the scans.   We are also referring Dezyre back to her dermatologist, Dr. Elmon Else for evaluation of the rash around her umbilicus. In the meanwhile, I am giving her a prescription for Lotrisone cream to use twice daily.    All this was reviewed in detail with Kaylinn today, and she voices understanding and agreement with our plan. She will continue to be followed regularly by Dr. Jonny Ruiz for her comorbidities, and will contact us with any changes or questions prior to her next appointment here in June 2015.   Daemien Fronczak PA-C    09/04/2013

## 2013-09-25 ENCOUNTER — Encounter (INDEPENDENT_AMBULATORY_CARE_PROVIDER_SITE_OTHER): Payer: Self-pay | Admitting: General Surgery

## 2013-09-25 ENCOUNTER — Ambulatory Visit (INDEPENDENT_AMBULATORY_CARE_PROVIDER_SITE_OTHER): Payer: 59 | Admitting: General Surgery

## 2013-09-25 ENCOUNTER — Telehealth (INDEPENDENT_AMBULATORY_CARE_PROVIDER_SITE_OTHER): Payer: Self-pay | Admitting: General Surgery

## 2013-09-25 VITALS — BP 138/80 | HR 64 | Temp 97.7°F | Resp 14 | Ht 59.0 in | Wt 227.8 lb

## 2013-09-25 DIAGNOSIS — N6310 Unspecified lump in the right breast, unspecified quadrant: Secondary | ICD-10-CM | POA: Insufficient documentation

## 2013-09-25 DIAGNOSIS — N63 Unspecified lump in unspecified breast: Secondary | ICD-10-CM

## 2013-09-25 NOTE — Progress Notes (Signed)
Chief Complaint  Patient presents with  . New Evaluation    eval enlarged lymphnode in right breast  . Long QT Syndrome    HISTORY: Pt is a 57 yo F with a past history of left breast cancer 2 years ago.  She underwent BCT and has been on tamoxifen since then.  She was found to have a mass in the right breast that was found to be an intramammary lymph node.  This was biopsied last year and was found to be benign.  However, the mass has gotten larger.  She denies right breast pain.  She has had some occasional pains in her post operative breast.  She states that she doesn't really take her medications the way she is supposed to.    Past Medical History  Diagnosis Date  . HYPERLIPIDEMIA 05/11/2008  . OBSTRUCTIVE SLEEP APNEA 05/11/2008  . RASH-NONVESICULAR 06/29/2008  . COLONIC POLYPS, HX OF 05/11/2008  . GENITAL HERPES, HX OF 08/08/2009  . Inverted nipple   . Menorrhagia   . 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 LLQ pain   . Hernia   . Low iron   . H/O gonorrhea   . Bacterial infection   . Trichomonas   . H/O irritable bowel syndrome   . Breast cancer     2012  . Hypertension   . Arthritis   . Abscess of buttock   . Diabetes mellitus without complication   . Allergy     Past Surgical History  Procedure Laterality Date  . Cesarean section    . Right achilles tendon      and left  . S/p ear surgury    . Right ovarian cyst      hx  . S/p extra uterine fibroid  2006  . S/p left knee replacement  2007  . Dilation and curettage of uterus    . Uterine fibroid surgery  2006  . Breast cyst excision  1973  . Breast lumpectomy    . Axillary lymph node dissection    . Abdominal hysterectomy      Current Outpatient Prescriptions  Medication Sig Dispense Refill  . cefUROXime (CEFTIN) 500 MG tablet Take 500 mg by mouth 2 (two) times daily with a meal.      . cholecalciferol (VITAMIN D) 400 UNITS TABS Take 1,000 Units by mouth daily.      . ciclopirox (PENLAC) 8 % solution Apply  topically at bedtime. Apply over nail and surrounding skin. Apply daily over previous coat. After seven (7) days, may remove with alcohol and continue cycle.  6.6 mL  11  . clotrimazole-betamethasone (LOTRISONE) cream Apply topically 2 (two) times daily.  45 g  1  . Ferrous Sulfate (IRON) 325 (65 FE) MG TABS Take by mouth daily.        [name] ibuprofen (ADVIL,MOTRIN) 800 MG tablet Take 800 mg by mouth 3 (three) times daily.      . meloxicam (MOBIC) 7.5 MG tablet Take 7.5 mg by mouth daily.      . metFORMIN (GLUCOPHAGE XR) 500 MG 24 hr tablet Take 1 tablet (500 mg total) by mouth daily with breakfast.  90 tablet  3  . Multiple Vitamin (MULTIVITAMIN) tablet Take 1 tablet by mouth daily.      . tamoxifen (NOLVADEX) 20 MG tablet Take 1 tablet (20 mg total) by mouth daily.  90 tablet  3  . telmisartan-hydrochlorothiazide (MICARDIS HCT) 80-12.5 MG per tablet Take 1 tablet by mouth daily.  90  tablet  3  . valACYclovir (VALTREX) 1000 MG tablet Take 1 tablet (1,000 mg total) by mouth daily.  90 tablet  3  . vitamin C (ASCORBIC ACID) 500 MG tablet Take 500 mg by mouth daily.      . chlorpheniramine-HYDROcodone (TUSSIONEX) 10-8 MG/5ML LQCR       . [DISCONTINUED] tolterodine (DETROL LA) 4 MG 24 hr capsule Take 4 mg by mouth daily.       No current facility-administered medications for this visit.     Allergies  Allergen Reactions  . Codeine   . Cymbalta [Duloxetine Hcl] Other (See Comments)    Head felt funny, ? thinking not right  . Darvon   . Propoxyphene Hcl      Family History  Problem Relation Age of Onset  . Diabetes Mother   . Hypertension Mother   . Stroke Mother   . Heart disease Father     COPD  . Alcohol abuse Father     ETOH dependence  . Colon cancer Neg Hx   . Cancer Maternal Aunt     breast     History   Social History  . Marital Status: Divorced    Spouse Name: N/A    Number of Children: 0  . 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 Smoking status: Never Smoker   . Smokeless tobacco: Never Used  . Alcohol Use: Yes     Comment: occasionally drinks wine  . Drug Use: No  . Sexual Activity: Not Currently   Other Topics Concern  . None   Social History Narrative   1 son deceased with homicide   Patient has been divorced twice     REVIEW OF SYSTEMS - PERTINENT POSITIVES ONLY: 12 point review of systems negative other than HPI and PMH except for nasal congestion, hematuria  EXAM: Filed Vitals:   09/25/13 1325  BP: 138/80  Pulse: 64  Temp: 97.7 F (36.5 C)  Resp: 14   Filed Weights   09/25/13 1325  Weight: 227 lb 12.8 oz (103.329 kg)     Gen:  No acute distress.  Well nourished and well groomed.   Neurological: Alert and oriented to person, place, and time. Coordination normal.  Head: Normocephalic and atraumatic.  Eyes: Conjunctivae are normal. Pupils are equal, round, and reactive to light. No scleral icterus.  Neck: Normal range of motion. Neck supple. No tracheal deviation or thyromegaly present.  Cardiovascular: Normal rate, regular rhythm, and intact distal pulses.  Respiratory: Effort normal.  No respiratory distress. No chest wall tenderness.  GI: Soft. The abdomen is soft and nontender.  There is no rebound and no guarding.  Musculoskeletal: Normal range of motion. Extremities are nontender.  Lymphadenopathy: No cervical, preauricular, postauricular or axillary adenopathy is present Skin: Skin is warm and dry. No rash noted. No diaphoresis. No erythema. No pallor. No clubbing, cyanosis, or edema.   Psychiatric: Normal mood and affect. Behavior is normal. Judgment and thought content normal.    LABORATORY RESULTS: Available labs are reviewed  path 12/2011 Breast, right, needle core biopsy - LYMPHOID TISSUE.  RADIOLOGY RESULTS: See E-Chart or I-Site for most recent results.  Images and reports are reviewed. 1.2 cm oval mass at 10 o'clock, c/w lymph  node, but increased in size, increased vascularity.     ASSESSMENT AND PLAN: Breast mass, right Since this mass is only vaguely palpable, I will schedule  her for a needle localized excisional breast biopsy. She would like to wait to do this until after the holidays.  Although the core needle biopsy was benign, this does not appear concordant with the imaging findings.  The surgical procedure was described to the patient.  I discussed the incision type and location and whether we would need radiology involved on the day of surgery with a wire marker.      The risks and benefits of the procedure were described to the patient and he/she wishes to proceed.    We discussed the risks bleeding, infection, damage to other structures, need for further procedures/surgeries.  We discussed the risk of seroma.  The patient was advised if the area in the breast in cancer, we may need to go back to surgery for additional tissue to obtain negative margins or for a lymph node biopsy. The patient was advised that these are the most common complications, but that others can occur as well.  They were advised against taking aspirin or other anti-inflammatory agents/blood thinners the week before surgery.        Maudry Diego MD Surgical Oncology, General and Endocrine Surgery [facility]      Visit Diagnoses: 1. Breast mass, right     Primary Care Physician: Oliver Barre, MD

## 2013-09-25 NOTE — Patient Instructions (Signed)
IF YOU ARE TAKING ASPIRIN, COUMADIN/WARFARIN, PLAVIX, OR OTHER BLOOD THINNER, PLEASE LET US KNOW IMMEDIATELY.  WE WILL NEED TO DISCUSS WITH THE PRESCRIBING PROVIDER IF THESE ARE SAFE TO STOP. IF THESE ARE NOT STOPPED AT THE APPROPRIATE TIME, THIS WILL RESULT IN A DELAY FOR YOUR SURGERY.  DO NOT TAKE THESE MEDICATIONS OR IBUPROFEN/NAPROXEN WITHIN A WEEK BEFORE SURGERY.   The main risks of surgery are bleeding, infection, damage to other structures, and seroma (accumulation of fluid) under the incision site(s).    These complications may lead to additional procedures such as drainage of seroma/infection.  If cancer is found, you may need other surgeries to obtain negative margins or to take more lymph nodes.   Most women do accumulate fluid in the breast cavity where the specimen was removed. We do not always have to drain this fluid.  If your breast is very tense, painful, or red, then we may need to numb the skin and use a needle to aspirate the fluid.  We do provide patients with a Breast Binder.  The purpose of this is to avoid the use of tape on the sensitive tissue of the breast and to provide some compression to minimize the risk of seroma.  If the binder is uncomfortable, you may find that a tank top with a built-in shelf bra or a loose sports bra works better for you.  I recommend wearing this around the clock for the first 1-2 weeks except in the shower.    You may remove your dressings and may shower 48 hours after surgery.    Many patients have some constipation in the week after surgery due to the narcotics and anesthesia.  You may need over the counter stool softeners or laxatives if you experience difficulty having bowel movements.    If the following occur, call our office at [phone]: If you have a fever over 101 or pain that is severe despite narcotics. If you have redness or drainage at the wound. If you develop persistent nausea or vomiting.  I will follow you back up in  1-4 weeks.    Please submit any paperwork about time off work/insurance forms to the front desk.      

## 2013-09-25 NOTE — Telephone Encounter (Signed)
09/25/13 I met with patient, however she did not want to proceed with scheduling her surgery at his time. She might want to have it done in late February or March. The patient understands her financial responsibility. I gave her my card to call me if she decides she would like to schedule sooner.  skm

## 2013-09-25 NOTE — Assessment & Plan Note (Signed)
Since this mass is only vaguely palpable, I will schedule her for a needle localized excisional breast biopsy. She would like to wait to do this until after the holidays.  Although the core needle biopsy was benign, this does not appear concordant with the imaging findings.  The surgical procedure was described to the patient.  I discussed the incision type and location and whether we would need radiology involved on the day of surgery with a wire marker.      The risks and benefits of the procedure were described to the patient and he/she wishes to proceed.    We discussed the risks bleeding, infection, damage to other structures, need for further procedures/surgeries.  We discussed the risk of seroma.  The patient was advised if the area in the breast in cancer, we may need to go back to surgery for additional tissue to obtain negative margins or for a lymph node biopsy. The patient was advised that these are the most common complications, but that others can occur as well.  They were advised against taking aspirin or other anti-inflammatory agents/blood thinners the week before surgery.

## 2013-09-26 ENCOUNTER — Ambulatory Visit: Payer: 59 | Admitting: Endocrinology

## 2013-10-02 ENCOUNTER — Ambulatory Visit (INDEPENDENT_AMBULATORY_CARE_PROVIDER_SITE_OTHER): Payer: 59 | Admitting: Endocrinology

## 2013-10-02 ENCOUNTER — Encounter: Payer: Self-pay | Admitting: Endocrinology

## 2013-10-02 VITALS — BP 124/90 | HR 66 | Temp 98.0°F | Ht 59.0 in | Wt 232.0 lb

## 2013-10-02 DIAGNOSIS — IMO0001 Reserved for inherently not codable concepts without codable children: Secondary | ICD-10-CM

## 2013-10-02 LAB — HEMOGLOBIN A1C: Hgb A1c MFr Bld: 6.5 % (ref 4.6–6.5)

## 2013-10-02 MED ORDER — GLUCOSE BLOOD VI STRP: 1.0000 | ORAL_STRIP | Freq: Every day | Status: DC

## 2013-10-02 NOTE — Patient Instructions (Addendum)
blood tests are being requested for you today.  We'll contact you with results. Please come back for a follow-up appointment in 6 months  

## 2013-10-02 NOTE — Progress Notes (Signed)
Subjective:    Patient ID: [name], female    DOB: [birth year], 58 y.o.   MRN: [id number]  HPI pt returns for f/u of type 2 DM (dx'ed 2014; she has moderate neuropathy of the lower extremities; she is unaware of any associated chronic complications; she has never been on insulin).  She has weight gain.   Past Medical History  Diagnosis Date  . HYPERLIPIDEMIA 05/11/2008  . OBSTRUCTIVE SLEEP APNEA 05/11/2008  . RASH-NONVESICULAR 06/29/2008  . COLONIC POLYPS, HX OF 05/11/2008  . GENITAL HERPES, HX OF 08/08/2009  . Inverted nipple   . Menorrhagia   . 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 LLQ pain   . Hernia   . Low iron   . H/O gonorrhea   . Bacterial infection   . Trichomonas   . H/O irritable bowel syndrome   . Breast cancer     2012  . Hypertension   . Arthritis   . Abscess of buttock   . Diabetes mellitus without complication   . Allergy     Past Surgical History  Procedure Laterality Date  . Cesarean section    . Right achilles tendon      and left  . S/p ear surgury    . Right ovarian cyst      hx  . S/p extra uterine fibroid  2006  . S/p left knee replacement  2007  . Dilation and curettage of uterus    . Uterine fibroid surgery  2006  . Breast cyst excision  1973  . Breast lumpectomy    . Axillary lymph node dissection    . Abdominal hysterectomy      History   Social History  . Marital Status: Divorced    Spouse Name: N/A    Number of Children: 0  . 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 Smoking status: Never Smoker   . Smokeless tobacco: Never Used  . Alcohol Use: Yes     Comment: occasionally drinks wine  . Drug Use: No  . Sexual Activity: Not Currently   Other Topics Concern  . Not on file   Social History Narrative   1 son deceased with homicide   Patient has been divorced twice    Current Outpatient Prescriptions on File Prior to Visit  Medication  Sig Dispense Refill  . cefUROXime (CEFTIN) 500 MG tablet Take 500 mg by mouth 2 (two) times daily with a meal.      . chlorpheniramine-HYDROcodone (TUSSIONEX) 10-8 MG/5ML LQCR       . cholecalciferol (VITAMIN D) 400 UNITS TABS Take 1,000 Units by mouth daily.      . ciclopirox (PENLAC) 8 % solution Apply topically at bedtime. Apply over nail and surrounding skin. Apply daily over previous coat. After seven (7) days, may remove with alcohol and continue cycle.  6.6 mL  11  . clotrimazole-betamethasone (LOTRISONE) cream Apply topically 2 (two) times daily.  45 g  1  . Ferrous Sulfate (IRON) 325 (65 FE) MG TABS Take by mouth daily.        [name] ibuprofen (ADVIL,MOTRIN) 800 MG tablet Take 800 mg by mouth 3 (three) times daily.      . meloxicam (MOBIC) 7.5 MG tablet Take 7.5 mg by mouth daily.      . metFORMIN (GLUCOPHAGE XR) 500 MG 24 hr tablet Take 1 tablet (500 mg total) by mouth daily  with breakfast.  90 tablet  3  . Multiple Vitamin (MULTIVITAMIN) tablet Take 1 tablet by mouth daily.      . tamoxifen (NOLVADEX) 20 MG tablet Take 1 tablet (20 mg total) by mouth daily.  90 tablet  3  . telmisartan-hydrochlorothiazide (MICARDIS HCT) 80-12.5 MG per tablet Take 1 tablet by mouth daily.  90 tablet  3  . valACYclovir (VALTREX) 1000 MG tablet Take 1 tablet (1,000 mg total) by mouth daily.  90 tablet  3  . vitamin C (ASCORBIC ACID) 500 MG tablet Take 500 mg by mouth daily.      . [DISCONTINUED] tolterodine (DETROL LA) 4 MG 24 hr capsule Take 4 mg by mouth daily.       No current facility-administered medications on file prior to visit.    Allergies  Allergen Reactions  . Codeine   . Cymbalta [Duloxetine Hcl] Other (See Comments)    Head felt funny, ? thinking not right  . Darvon   . Propoxyphene Hcl     Family History  Problem Relation Age of Onset  . Diabetes Mother   . Hypertension Mother   . Stroke Mother   . Heart disease Father     COPD  . Alcohol abuse Father     ETOH dependence  .  Colon cancer Neg Hx   . Cancer Maternal Aunt     breast    BP 124/90  Pulse 66  Temp(Src) 98 F (36.7 C) (Oral)  Ht 4\' 11"  (1.499 m)  Wt 232 lb (105.235 kg)  BMI 46.83 kg/m2  SpO2 98%  Review of Systems Denies edema and hypoglycemia.      Objective:   Physical Exam VITAL SIGNS:  See vs page. GENERAL: no distress.  Lab Results  Component Value Date   HGBA1C 6.5 10/02/2013      Assessment & Plan:  Type 2 DM: well-controlled. Morbid obesity: this complicates the h/o DM. Chronic arthralgias: these limit exercise rx of DM.

## 2013-11-28 ENCOUNTER — Encounter: Payer: Self-pay | Admitting: Internal Medicine

## 2013-11-28 ENCOUNTER — Ambulatory Visit (INDEPENDENT_AMBULATORY_CARE_PROVIDER_SITE_OTHER): Payer: 59 | Admitting: Internal Medicine

## 2013-11-28 ENCOUNTER — Other Ambulatory Visit (INDEPENDENT_AMBULATORY_CARE_PROVIDER_SITE_OTHER): Payer: 59

## 2013-11-28 VITALS — BP 130/78 | HR 60 | Temp 98.2°F | Ht <= 58 in | Wt 232.5 lb

## 2013-11-28 DIAGNOSIS — Z0000 Encounter for general adult medical examination without abnormal findings: Secondary | ICD-10-CM

## 2013-11-28 DIAGNOSIS — IMO0001 Reserved for inherently not codable concepts without codable children: Secondary | ICD-10-CM

## 2013-11-28 DIAGNOSIS — E1165 Type 2 diabetes mellitus with hyperglycemia: Principal | ICD-10-CM

## 2013-11-28 LAB — CBC WITH DIFFERENTIAL/PLATELET
Basophils Absolute: 0 10*3/uL (ref 0.0–0.1)
Basophils Relative: 0.4 % (ref 0.0–3.0)
Eosinophils Absolute: 0.1 10*3/uL (ref 0.0–0.7)
Eosinophils Relative: 1.5 % (ref 0.0–5.0)
HCT: 37.7 % (ref 36.0–46.0)
Hemoglobin: 12.2 g/dL (ref 12.0–15.0)
Lymphocytes Relative: 33.9 % (ref 12.0–46.0)
Lymphs Abs: 1.8 10*3/uL (ref 0.7–4.0)
MCHC: 32.3 g/dL (ref 30.0–36.0)
MCV: 85.4 fl (ref 78.0–100.0)
Monocytes Absolute: 0.5 10*3/uL (ref 0.1–1.0)
Monocytes Relative: 9.3 % (ref 3.0–12.0)
Neutro Abs: 2.9 10*3/uL (ref 1.4–7.7)
Neutrophils Relative %: 54.9 % (ref 43.0–77.0)
Platelets: 256 10*3/uL (ref 150.0–400.0)
RBC: 4.41 Mil/uL (ref 3.87–5.11)
RDW: 15 % — ABNORMAL HIGH (ref 11.5–14.6)
WBC: 5.3 10*3/uL (ref 4.5–10.5)

## 2013-11-28 LAB — BASIC METABOLIC PANEL
BUN: 16 mg/dL (ref 6–23)
CO2: 27 mEq/L (ref 19–32)
Calcium: 9.2 mg/dL (ref 8.4–10.5)
Chloride: 103 mEq/L (ref 96–112)
Creatinine, Ser: 0.7 mg/dL (ref 0.4–1.2)
GFR: 121.62 mL/min (ref >60.00)
Glucose, Bld: 85 mg/dL (ref 70–99)
Potassium: 3.6 mEq/L (ref 3.5–5.1)
Sodium: 137 mEq/L (ref 135–145)

## 2013-11-28 LAB — URINALYSIS, ROUTINE W REFLEX MICROSCOPIC
Bilirubin Urine: NEGATIVE
Ketones, ur: NEGATIVE
Leukocytes, UA: NEGATIVE
Nitrite: NEGATIVE
Specific Gravity, Urine: 1.025 (ref 1.000–1.030)
Total Protein, Urine: NEGATIVE
Urine Glucose: NEGATIVE
Urobilinogen, UA: 0.2 (ref 0.0–1.0)
pH: 6 (ref 5.0–8.0)

## 2013-11-28 LAB — LIPID PANEL
Cholesterol: 180 mg/dL (ref 0–200)
HDL: 79.8 mg/dL (ref >39.00)
LDL Cholesterol: 81 mg/dL (ref 0–99)
Total CHOL/HDL Ratio: 2
Triglycerides: 97 mg/dL (ref 0.0–149.0)
VLDL: 19.4 mg/dL (ref 0.0–40.0)

## 2013-11-28 LAB — TSH: TSH: 2.37 u[IU]/mL (ref 0.35–5.50)

## 2013-11-28 LAB — MICROALBUMIN / CREATININE URINE RATIO
Creatinine,U: 95.7 mg/dL
Microalb Creat Ratio: 0.3 mg/g (ref 0.0–30.0)
Microalb, Ur: 0.3 mg/dL (ref 0.0–1.9)

## 2013-11-28 LAB — HEPATIC FUNCTION PANEL
ALT: 15 U/L (ref 0–35)
AST: 16 U/L (ref 0–37)
Albumin: 3.6 g/dL (ref 3.5–5.2)
Alkaline Phosphatase: 36 U/L — ABNORMAL LOW (ref 39–117)
Bilirubin, Direct: 0 mg/dL (ref 0.0–0.3)
Total Bilirubin: 0.6 mg/dL (ref 0.3–1.2)
Total Protein: 8.1 g/dL (ref 6.0–8.3)

## 2013-11-28 MED ORDER — TELMISARTAN-HCTZ 80-12.5 MG PO TABS: 1.0000 | ORAL_TABLET | Freq: Every day | ORAL | Status: DC

## 2013-11-28 NOTE — Progress Notes (Signed)
Pre-visit discussion using our clinic review tool. No additional management support is needed unless otherwise documented below in the visit note.  

## 2013-11-28 NOTE — Patient Instructions (Addendum)
Your right ear was irrigated of wax today  Please continue all other medications as before, and refills have been done if requested. Please have the pharmacy call with any other refills you may need. Please continue your efforts at being more active, low cholesterol diet, and weight control. You are otherwise up to date with prevention measures today.  Please keep your appointments with your specialists as you have planned - the biopsy, as well as Dr Loanne Drilling as planned  Please go to the LAB in the Basement (turn left off the elevator) for the tests to be done today You will be contacted by phone if any changes need to be made immediately.  Otherwise, you will receive a letter about your results with an explanation, but please check with MyChart first.  Please return in 1 year for your yearly visit, or sooner if needed (I think you can be seen in one yr, if you keep your specialists appts as well)

## 2013-11-28 NOTE — Progress Notes (Signed)
Subjective:    Patient ID: [name], female    DOB: [birth year], 58 y.o.   MRN: [id number]  HPI  Here for wellness and f/u;  Overall doing ok;  Pt denies CP, worsening SOB, DOE, wheezing, orthopnea, PND, worsening LE edema, palpitations, dizziness or syncope.  Pt denies neurological change such as new headache, facial or extremity weakness.  Pt denies polydipsia, polyuria, or low sugar symptoms. Pt states overall good compliance with treatment and medications, good tolerability, and has been trying to follow lower cholesterol diet.  Pt denies worsening depressive symptoms, suicidal ideation or panic. No fever, night sweats, wt loss, loss of appetite, or other constitutional symptoms.  Pt states good ability with ADL's, has low fall risk, home safety reviewed and adequate, no other significant changes in hearing or vision, and only occasionally active with exercise.  Due for lump/? LN on the right for surgury in next few wks. Admits to less thanperfect med compliacne, though does take mostly.  Need left knee /leg surgury , keeps putting off to lose wt, but unable so far.  Also has right ear hearing loss mild in the past 2wk Past Medical History  Diagnosis Date  . HYPERLIPIDEMIA 05/11/2008  . OBSTRUCTIVE SLEEP APNEA 05/11/2008  . RASH-NONVESICULAR 06/29/2008  . COLONIC POLYPS, HX OF 05/11/2008  . GENITAL HERPES, HX OF 08/08/2009  . Inverted nipple   . Menorrhagia   . 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 LLQ pain   . Hernia   . Low iron   . H/O gonorrhea   . Bacterial infection   . Trichomonas   . H/O irritable bowel syndrome   . Breast cancer     2012  . Hypertension   . Arthritis   . Abscess of buttock   . Diabetes mellitus without complication   . Allergy    Past Surgical History  Procedure Laterality Date  . Cesarean section    . Right achilles tendon      and left  . S/p ear surgury    . Right ovarian cyst      hx  . S/p extra uterine fibroid  2006  . S/p left knee  replacement  2007  . Dilation and curettage of uterus    . Uterine fibroid surgery  2006  . Breast cyst excision  1973  . Breast lumpectomy    . Axillary lymph node dissection    . Abdominal hysterectomy      reports that she has never smoked. She has never used smokeless tobacco. She reports that she drinks alcohol. She reports that she does not use illicit drugs. family history includes Alcohol abuse in her father; Cancer in her maternal aunt; Diabetes in her mother; Heart disease in her father; Hypertension in her mother; Stroke in her mother. There is no history of Colon cancer. Allergies  Allergen Reactions  . Codeine   . Cymbalta [Duloxetine Hcl] Other (See Comments)    Head felt funny, ? thinking not right  . Darvon   . Propoxyphene Hcl    Current Outpatient Prescriptions on File Prior to Visit  Medication Sig Dispense Refill  . chlorpheniramine-HYDROcodone (TUSSIONEX) 10-8 MG/5ML LQCR       . cholecalciferol (VITAMIN D) 400 UNITS TABS Take 1,000 Units by mouth daily.      . ciclopirox (PENLAC) 8 % solution Apply topically at bedtime. Apply over nail and surrounding skin. Apply daily over previous coat. After seven (7) days, may remove with alcohol and  continue cycle.  6.6 mL  11  . clotrimazole-betamethasone (LOTRISONE) cream Apply topically 2 (two) times daily.  45 g  1  . Ferrous Sulfate (IRON) 325 (65 FE) MG TABS Take by mouth daily.        [name] glucose blood (ONE TOUCH ULTRA TEST) test strip 1 each by Other route daily. And lancets 1/day 250.00  100 each  12  . ibuprofen (ADVIL,MOTRIN) 800 MG tablet Take 800 mg by mouth 3 (three) times daily.      . meloxicam (MOBIC) 7.5 MG tablet Take 7.5 mg by mouth daily.      . metFORMIN (GLUCOPHAGE XR) 500 MG 24 hr tablet Take 1 tablet (500 mg total) by mouth daily with breakfast.  90 tablet  3  . Multiple Vitamin (MULTIVITAMIN) tablet Take 1 tablet by mouth daily.      . tamoxifen (NOLVADEX) 20 MG tablet Take 1 tablet (20 mg total) by  mouth daily.  90 tablet  3  . valACYclovir (VALTREX) 1000 MG tablet Take 1 tablet (1,000 mg total) by mouth daily.  90 tablet  3  . vitamin C (ASCORBIC ACID) 500 MG tablet Take 500 mg by mouth daily.      . [DISCONTINUED] tolterodine (DETROL LA) 4 MG 24 hr capsule Take 4 mg by mouth daily.       No current facility-administered medications on file prior to visit.    Review of Systems Constitutional: Negative for diaphoresis, activity change, appetite change or unexpected weight change.  HENT: Negative for hearing loss, ear pain, facial swelling, mouth sores and neck stiffness.   Eyes: Negative for pain, redness and visual disturbance.  Respiratory: Negative for shortness of breath and wheezing.   Cardiovascular: Negative for chest pain and palpitations.  Gastrointestinal: Negative for diarrhea, blood in stool, abdominal distention or other pain Genitourinary: Negative for hematuria, flank pain or change in urine volume.  Musculoskeletal: Negative for myalgias and joint swelling.  Skin: Negative for color change and wound.  Neurological: Negative for syncope and numbness. other than noted Hematological: Negative for adenopathy.  Psychiatric/Behavioral: Negative for hallucinations, self-injury, decreased concentration and agitation.      Objective:   Physical Exam BP 130/78  Pulse 60  Temp(Src) 98.2 F (36.8 C) (Oral)  Ht 4\' 10"  (1.473 m)  Wt 232 lb 8 oz (105.461 kg)  BMI 48.61 kg/m2  SpO2 97% VS noted,  Constitutional: Pt is oriented to person, place, and time. Appears well-developed and well-nourished.  Head: Normocephalic and atraumatic.  Right Ear: External ear normal.  Left Ear: External ear normal.  Nose: Nose normal.  Mouth/Throat: Oropharynx is clear and moist.  Right ear canal irrigated of wax, now clear Eyes: Conjunctivae and EOM are normal. Pupils are equal, round, and reactive to light.  Neck: Normal range of motion. Neck supple. No JVD present. No tracheal  deviation present.  Cardiovascular: Normal rate, regular rhythm, normal heart sounds and intact distal pulses.   Pulmonary/Chest: Effort normal and breath sounds normal.  Abdominal: Soft. Bowel sounds are normal. There is no tenderness. No HSM  Musculoskeletal: Normal range of motion. Exhibits no edema.  Lymphadenopathy:  Has no cervical adenopathy.  Neurological: Pt is alert and oriented to person, place, and time. Pt has normal reflexes. No cranial nerve deficit.  Skin: Skin is warm and dry. No rash noted.  Psychiatric:  Has  normal mood and affect. Behavior is normal.     Assessment & Plan:

## 2013-12-02 NOTE — Assessment & Plan Note (Addendum)
Overall doing well, age appropriate education and counseling updated, referrals for preventative services and immunizations addressed, dietary and smoking counseling addressed, most recent labs reviewed.  I have personally reviewed and have noted: 1) the patient's medical and social history 2) The pt's use of alcohol, tobacco, and illicit drugs 3) The patient's current medications and supplements 4) Functional ability including ADL's, fall risk, home safety risk, hearing and visual impairment Right ear irrigated today, hearing improved 5) Diet and physical activities 6) Evidence for depression or mood disorder 7) The patient's height, weight, and BMI have been recorded in the chart I have made referrals, and provided counseling and education based on review of the above

## 2013-12-05 ENCOUNTER — Other Ambulatory Visit (INDEPENDENT_AMBULATORY_CARE_PROVIDER_SITE_OTHER): Payer: Self-pay | Admitting: General Surgery

## 2013-12-12 ENCOUNTER — Encounter (HOSPITAL_COMMUNITY): Payer: Self-pay | Admitting: Pharmacist

## 2013-12-13 ENCOUNTER — Other Ambulatory Visit (HOSPITAL_COMMUNITY): Payer: Self-pay | Admitting: *Deleted

## 2013-12-13 NOTE — Pre-Procedure Instructions (Signed)
[name]  12/13/2013   Your procedure is scheduled on:  Wednesday, December 20, 2013 at 9:30 AM.   Report to [facility] Entrance "A" Admitting Office at 7:30 AM.   Call this number if you have problems the morning of surgery: [phone]   Remember:   Do not eat food or drink liquids after midnight Tuesday, 12/19/13.   Take these medicines the morning of surgery with A SIP OF WATER: tamoxifen (NOLVADEX)  Stop Vitamins and Ibuprofen as of today.  Do NOT take your diabetic medicine the morning of surgery    Do not wear jewelry, make-up or nail polish.  Do not wear lotions, powders, or perfumes. You may NOT wear deodorant.  Do not shave 48 hours prior to surgery. Men may shave face and neck.  Do not bring valuables to the hospital.  [facility] is not responsible                  for any belongings or valuables.               Contacts, dentures or bridgework may not be worn into surgery.  Leave suitcase in the car. After surgery it may be brought to your room.  For patients admitted to the hospital, discharge time is determined by your                treatment team.               Patients discharged the day of surgery will not be allowed to drive home.  Name and phone number of your driver: Family/friend   Special Instructions: [place] - Preparing for Surgery  Before surgery, you can play an important role.  Because skin is not sterile, your skin needs to be as free of germs as possible.  You can reduce the number of germs on you skin by washing with CHG (chlorahexidine gluconate) soap before surgery.  CHG is an antiseptic cleaner which kills germs and bonds with the skin to continue killing germs even after washing.  Please DO NOT use if you have an allergy to CHG or antibacterial soaps.  If your skin becomes reddened/irritated stop using the CHG and inform your nurse when you arrive at Short Stay.  Do not shave (including legs and underarms) for at least 48 hours  prior to the first CHG shower.  You may shave your face.  Please follow these instructions carefully:   1.  Shower with CHG Soap the night before surgery and the                                morning of Surgery.  2.  If you choose to wash your hair, wash your hair first as usual with your       normal shampoo.  3.  After you shampoo, rinse your hair and body thoroughly to remove the                      Shampoo.  4.  Use CHG as you would any other liquid soap.  You can apply chg directly       to the skin and wash gently with scrungie or a clean washcloth.  5.  Apply the CHG Soap to your body ONLY FROM THE NECK DOWN.        Do not use on open wounds or open  sores.  Avoid contact with your eyes, ears, mouth and genitals (private parts).  Wash genitals (private parts) with your normal soap.  6.  Wash thoroughly, paying special attention to the area where your surgery        will be performed.  7.  Thoroughly rinse your body with warm water from the neck down.  8.  DO NOT shower/wash with your normal soap after using and rinsing off       the CHG Soap.  9.  Pat yourself dry with a clean towel.            10.  Wear clean pajamas.            11.  Place clean sheets on your bed the night of your first shower and do not        sleep with pets.  Day of Surgery  Do not apply any lotions/deodorants the morning of surgery.  Please wear clean clothes to the hospital/surgery center.     Please read over the following fact sheets that you were given: Pain Booklet, Coughing and Deep Breathing and Surgical Site Infection Prevention

## 2013-12-14 ENCOUNTER — Encounter (HOSPITAL_COMMUNITY)
Admission: RE | Admit: 2013-12-14 | Discharge: 2013-12-14 | Disposition: A | Discharge status: Home / Self Care | Payer: 59 | Source: Ambulatory Visit | Attending: General Surgery | Admitting: General Surgery

## 2013-12-14 ENCOUNTER — Encounter (HOSPITAL_COMMUNITY)
Admission: RE | Admit: 2013-12-14 | Discharge: 2013-12-14 | Disposition: A | Discharge status: Home / Self Care | Payer: 59 | Source: Ambulatory Visit | Attending: Anesthesiology | Admitting: Anesthesiology

## 2013-12-14 ENCOUNTER — Encounter (HOSPITAL_COMMUNITY): Payer: Self-pay

## 2013-12-14 DIAGNOSIS — Z01818 Encounter for other preprocedural examination: Secondary | ICD-10-CM | POA: Insufficient documentation

## 2013-12-14 DIAGNOSIS — Z01812 Encounter for preprocedural laboratory examination: Secondary | ICD-10-CM | POA: Insufficient documentation

## 2013-12-14 HISTORY — DX: Anemia, unspecified: D64.9

## 2013-12-14 HISTORY — DX: Personal history of other diseases of the digestive system: Z87.19

## 2013-12-14 HISTORY — DX: Cardiac arrhythmia, unspecified: I49.9

## 2013-12-14 LAB — BASIC METABOLIC PANEL
BUN: 15 mg/dL (ref 6–23)
CO2: 26 mEq/L (ref 19–32)
Calcium: 9.7 mg/dL (ref 8.4–10.5)
Chloride: 104 mEq/L (ref 96–112)
Creatinine, Ser: 0.66 mg/dL (ref 0.50–1.10)
GFR calc Af Amer: >90 mL/min (ref >90)
GFR calc non Af Amer: >90 mL/min (ref >90)
Glucose, Bld: 101 mg/dL — ABNORMAL HIGH (ref 70–99)
Potassium: 3.7 mEq/L (ref 3.7–5.3)
Sodium: 141 mEq/L (ref 137–147)

## 2013-12-14 LAB — CBC
HCT: 35.2 % — ABNORMAL LOW (ref 36.0–46.0)
Hemoglobin: 11.7 g/dL — ABNORMAL LOW (ref 12.0–15.0)
MCH: 28.4 pg (ref 26.0–34.0)
MCHC: 33.2 g/dL (ref 30.0–36.0)
MCV: 85.4 fL (ref 78.0–100.0)
Platelets: 239 10*3/uL (ref 150–400)
RBC: 4.12 MIL/uL (ref 3.87–5.11)
RDW: 15 % (ref 11.5–15.5)
WBC: 5.1 10*3/uL (ref 4.0–10.5)

## 2013-12-14 NOTE — Progress Notes (Signed)
Denies seeing a cardiologist PCP is Dr Cathlean Cower Oncologist is Dr Jana Hakim Denies having a recent CXR EKG noted in epic from 02-08-13

## 2013-12-14 NOTE — Progress Notes (Signed)
Anesthesia Chart Review:  Patient is a 58 year old female scheduled for right needle localized excisional biopsy on 12/20/13 by Dr. [name]. She was diagnosed with left breast cancer in 2012 and is s/p left lumpectomy with SN sampling 01/2011 and radiation. She was noted to have a right breast mass last year which was found to be an intramammary LN with benign biopsy;however the mass has enlarged and surgical excision was recommended.  History includes non-smoker, HTN, DM2, HLD, dysrhythmia (occasional PVC's 01/2013), OSA, hiatal hernia, IBS, breast cancer '12, anemia, arthritis, STD, left TKR '07.  BMI is 47.40 consistent with morbid obesity. PCP is Dr. Cathlean Cower with last wellness visit 12/02/13.  She denied CP, worsening SOB, DOE, PND, worsening LE edema, palpitations, dizziness or syncope at that visit. By notes, he is aware of plans for surgery.  Endocrinologist is Dr. Loanne Drilling.  EKG on 02/08/13 showed SR, diffuse non-specific T wave abnormality, PVC's, diffuse non-specific T wave abnormality.  EKG was already reviewed by Dr. Cathlean Cower and no other testing ordered at that time. PVC's are new, but diffuse T wave abnormality has been present since at least 07/2011.  Echo on 09/09/11 showed: - Left ventricle: The cavity size was normal. Wall thickness was normal. Systolic function was normal. The estimated ejection fraction was in the range of 60% to 65%. Wall motion was normal; there were no regional wall motion abnormalities. Doppler parameters are consistent with abnormal left ventricular relaxation (grade 1 diastolic dysfunction). - Left atrium: The atrium was mildly dilated. - Tricuspid valve: Mild regurgitation.  CXR on 12/14/13 showed no active disease. Surgical clips are noted in the left axilla.  Preoperative labs noted.  A1C on 10/02/13 was 6.5.  EKG is stable other than occasional PVCs. She was recently evaluated by her PCP and had no acute CV symptoms. He is aware of plans for surgery.  If no  acute changes then I would anticipate that she can proceed as planned.  Anesthesiologist Dr. Marcie Bal agrees with this plan.  George Hugh [facility]/Anesthesiology Phone [phone] 12/14/2013 5:25 PM

## 2013-12-19 MED ORDER — CEFAZOLIN SODIUM-DEXTROSE 2-3 GM-% IV SOLR
2.0000 g | INTRAVENOUS | Status: AC
  Administered 2013-12-20: 2 g via INTRAVENOUS
  Filled 2013-12-19: qty 50

## 2013-12-20 ENCOUNTER — Ambulatory Visit (HOSPITAL_COMMUNITY)
Admission: RE | Admit: 2013-12-20 | Discharge: 2013-12-20 | Disposition: A | Discharge status: Home / Self Care | Payer: 59 | Source: Ambulatory Visit | Attending: General Surgery | Admitting: General Surgery

## 2013-12-20 ENCOUNTER — Encounter (HOSPITAL_COMMUNITY)
Admission: RE | Disposition: A | Discharge status: Home / Self Care | Payer: Self-pay | Source: Ambulatory Visit | Attending: General Surgery

## 2013-12-20 ENCOUNTER — Encounter (HOSPITAL_COMMUNITY): Payer: Self-pay | Admitting: General Surgery

## 2013-12-20 ENCOUNTER — Ambulatory Visit (HOSPITAL_COMMUNITY): Payer: 59 | Admitting: Anesthesiology

## 2013-12-20 ENCOUNTER — Encounter (HOSPITAL_COMMUNITY): Payer: 59 | Admitting: Vascular Surgery

## 2013-12-20 DIAGNOSIS — E119 Type 2 diabetes mellitus without complications: Secondary | ICD-10-CM | POA: Insufficient documentation

## 2013-12-20 DIAGNOSIS — I10 Essential (primary) hypertension: Secondary | ICD-10-CM | POA: Insufficient documentation

## 2013-12-20 DIAGNOSIS — D360 Benign neoplasm of lymph nodes: Secondary | ICD-10-CM

## 2013-12-20 DIAGNOSIS — E785 Hyperlipidemia, unspecified: Secondary | ICD-10-CM | POA: Insufficient documentation

## 2013-12-20 DIAGNOSIS — A6000 Herpesviral infection of urogenital system, unspecified: Secondary | ICD-10-CM | POA: Insufficient documentation

## 2013-12-20 DIAGNOSIS — G4733 Obstructive sleep apnea (adult) (pediatric): Secondary | ICD-10-CM | POA: Insufficient documentation

## 2013-12-20 DIAGNOSIS — Z860100 Personal history of colon polyps, unspecified: Secondary | ICD-10-CM | POA: Insufficient documentation

## 2013-12-20 DIAGNOSIS — Z885 Allergy status to narcotic agent status: Secondary | ICD-10-CM | POA: Insufficient documentation

## 2013-12-20 DIAGNOSIS — I499 Cardiac arrhythmia, unspecified: Secondary | ICD-10-CM | POA: Insufficient documentation

## 2013-12-20 DIAGNOSIS — Z853 Personal history of malignant neoplasm of breast: Secondary | ICD-10-CM | POA: Insufficient documentation

## 2013-12-20 DIAGNOSIS — R928 Other abnormal and inconclusive findings on diagnostic imaging of breast: Secondary | ICD-10-CM | POA: Insufficient documentation

## 2013-12-20 HISTORY — PX: BREAST LUMPECTOMY WITH NEEDLE LOCALIZATION: SHX5759

## 2013-12-20 LAB — GLUCOSE, CAPILLARY
Glucose-Capillary: 111 mg/dL — ABNORMAL HIGH (ref 70–99)
Glucose-Capillary: 78 mg/dL (ref 70–99)

## 2013-12-20 SURGERY — BREAST LUMPECTOMY WITH NEEDLE LOCALIZATION
Anesthesia: General | Laterality: Right

## 2013-12-20 MED ORDER — LIDOCAINE HCL 1 % IJ SOLN
INTRAMUSCULAR | Status: DC | PRN
  Administered 2013-12-20: 11:00:00 via SUBCUTANEOUS

## 2013-12-20 MED ORDER — DEXAMETHASONE SODIUM PHOSPHATE 4 MG/ML IJ SOLN
INTRAMUSCULAR | Status: DC | PRN
  Administered 2013-12-20: 4 mg via INTRAVENOUS

## 2013-12-20 MED ORDER — LIDOCAINE HCL (CARDIAC) 20 MG/ML IV SOLN
INTRAVENOUS | Status: AC
  Filled 2013-12-20: qty 5

## 2013-12-20 MED ORDER — ONDANSETRON HCL 4 MG/2ML IJ SOLN
INTRAMUSCULAR | Status: DC | PRN
Start: 2013-12-20 — End: 2013-12-20
  Administered 2013-12-20: 4 mg via INTRAVENOUS

## 2013-12-20 MED ORDER — LIDOCAINE HCL (CARDIAC) 20 MG/ML IV SOLN
INTRAVENOUS | Status: DC | PRN
  Administered 2013-12-20: 100 mg via INTRAVENOUS

## 2013-12-20 MED ORDER — CHLORHEXIDINE GLUCONATE 4 % EX LIQD
1.0000 "application " | Freq: Once | CUTANEOUS | Status: DC
  Filled 2013-12-20: qty 15

## 2013-12-20 MED ORDER — ONDANSETRON HCL 4 MG/2ML IJ SOLN
INTRAMUSCULAR | Status: AC
  Filled 2013-12-20: qty 2

## 2013-12-20 MED ORDER — EPHEDRINE SULFATE 50 MG/ML IJ SOLN
INTRAMUSCULAR | Status: AC
  Filled 2013-12-20: qty 1

## 2013-12-20 MED ORDER — 0.9 % SODIUM CHLORIDE (POUR BTL) OPTIME
TOPICAL | Status: DC | PRN
  Administered 2013-12-20: 1000 mL

## 2013-12-20 MED ORDER — SODIUM CHLORIDE 0.9 % IJ SOLN
INTRAMUSCULAR | Status: AC
  Filled 2013-12-20: qty 10

## 2013-12-20 MED ORDER — PROPOFOL 10 MG/ML IV BOLUS
INTRAVENOUS | Status: DC | PRN
  Administered 2013-12-20: 150 mg via INTRAVENOUS

## 2013-12-20 MED ORDER — MIDAZOLAM HCL 2 MG/2ML IJ SOLN
INTRAMUSCULAR | Status: AC
  Filled 2013-12-20: qty 2

## 2013-12-20 MED ORDER — PROPOFOL 10 MG/ML IV BOLUS
INTRAVENOUS | Status: AC
  Filled 2013-12-20: qty 20

## 2013-12-20 MED ORDER — OXYCODONE-ACETAMINOPHEN 5-325 MG PO TABS: 1.0000 | ORAL_TABLET | ORAL | Status: DC | PRN

## 2013-12-20 MED ORDER — PHENYLEPHRINE HCL 10 MG/ML IJ SOLN
INTRAMUSCULAR | Status: DC | PRN
  Administered 2013-12-20 (×3): 80 ug via INTRAVENOUS
  Administered 2013-12-20: 120 ug via INTRAVENOUS
  Administered 2013-12-20: 80 ug via INTRAVENOUS

## 2013-12-20 MED ORDER — PHENYLEPHRINE 40 MCG/ML (10ML) SYRINGE FOR IV PUSH (FOR BLOOD PRESSURE SUPPORT)
PREFILLED_SYRINGE | INTRAVENOUS | Status: AC
  Filled 2013-12-20: qty 10

## 2013-12-20 MED ORDER — FENTANYL CITRATE 0.05 MG/ML IJ SOLN
INTRAMUSCULAR | Status: AC
  Filled 2013-12-20: qty 5

## 2013-12-20 MED ORDER — LACTATED RINGERS IV SOLN
INTRAVENOUS | Status: DC | PRN
  Administered 2013-12-20: 09:00:00 via INTRAVENOUS

## 2013-12-20 MED ORDER — MIDAZOLAM HCL 5 MG/5ML IJ SOLN
INTRAMUSCULAR | Status: DC | PRN
  Administered 2013-12-20: 2 mg via INTRAVENOUS

## 2013-12-20 MED ORDER — FENTANYL CITRATE 0.05 MG/ML IJ SOLN
INTRAMUSCULAR | Status: DC | PRN
  Administered 2013-12-20: 50 ug via INTRAVENOUS

## 2013-12-20 SURGICAL SUPPLY — 45 items
BINDER BREAST LRG (GAUZE/BANDAGES/DRESSINGS) IMPLANT
BINDER BREAST XLRG (GAUZE/BANDAGES/DRESSINGS) IMPLANT
BINDER BREAST XXLRG (GAUZE/BANDAGES/DRESSINGS) ×1 IMPLANT
BLADE SURG 10 STRL SS (BLADE) ×2 IMPLANT
BLADE SURG 15 STRL LF DISP TIS (BLADE) ×1 IMPLANT
BLADE SURG 15 STRL SS (BLADE) ×2
CANISTER SUCTION 2500CC (MISCELLANEOUS) ×2 IMPLANT
CHLORAPREP W/TINT 26ML (MISCELLANEOUS) ×2 IMPLANT
CLIP TI LARGE 6 (CLIP) ×2 IMPLANT
COVER SURGICAL LIGHT HANDLE (MISCELLANEOUS) ×2 IMPLANT
DEVICE DUBIN SPECIMEN MAMMOGRA (MISCELLANEOUS) ×2 IMPLANT
DRAPE CHEST BREAST 15X10 FENES (DRAPES) ×2 IMPLANT
DRAPE UTILITY 15X26 W/TAPE STR (DRAPE) ×4 IMPLANT
ELECT CAUTERY BLADE 6.4 (BLADE) ×2 IMPLANT
ELECT REM PT RETURN 9FT ADLT (ELECTROSURGICAL) ×2
ELECTRODE REM PT RTRN 9FT ADLT (ELECTROSURGICAL) ×1 IMPLANT
GLOVE BIO SURGEON STRL SZ 6 (GLOVE) ×2 IMPLANT
GLOVE BIOGEL PI IND STRL 6.5 (GLOVE) ×1 IMPLANT
GLOVE BIOGEL PI INDICATOR 6.5 (GLOVE) ×1
GOWN STRL NON-REIN LRG LVL3 (GOWN DISPOSABLE) ×2 IMPLANT
GOWN STRL REUS W/TWL 2XL LVL3 (GOWN DISPOSABLE) ×2 IMPLANT
KIT BASIN OR (CUSTOM PROCEDURE TRAY) ×2 IMPLANT
KIT MARKER MARGIN INK (KITS) IMPLANT
KIT ROOM TURNOVER OR (KITS) ×2 IMPLANT
NDL HYPO 25GX1X1/2 BEV (NEEDLE) ×1 IMPLANT
NEEDLE HYPO 25GX1X1/2 BEV (NEEDLE) ×2 IMPLANT
NS IRRIG 1000ML POUR BTL (IV SOLUTION) ×2 IMPLANT
PACK SURGICAL SETUP 50X90 (CUSTOM PROCEDURE TRAY) ×2 IMPLANT
PAD ABD 8X10 STRL (GAUZE/BANDAGES/DRESSINGS) ×1 IMPLANT
PAD ARMBOARD 7.5X6 YLW CONV (MISCELLANEOUS) ×2 IMPLANT
PENCIL BUTTON HOLSTER BLD 10FT (ELECTRODE) ×2 IMPLANT
SPONGE GAUZE 4X4 12PLY (GAUZE/BANDAGES/DRESSINGS) ×1 IMPLANT
SPONGE LAP 18X18 X RAY DECT (DISPOSABLE) ×2 IMPLANT
STRIP CLOSURE SKIN 1/2X4 (GAUZE/BANDAGES/DRESSINGS) ×1 IMPLANT
SUT MNCRL AB 4-0 PS2 18 (SUTURE) ×2 IMPLANT
SUT SILK 2 0 SH (SUTURE) IMPLANT
SUT VIC AB 2-0 SH 27 (SUTURE) ×2
SUT VIC AB 2-0 SH 27XBRD (SUTURE) ×1 IMPLANT
SUT VIC AB 3-0 SH 27 (SUTURE) ×2
SUT VIC AB 3-0 SH 27X BRD (SUTURE) ×1 IMPLANT
SYR CONTROL 10ML LL (SYRINGE) ×2 IMPLANT
TOWEL OR 17X24 6PK STRL BLUE (TOWEL DISPOSABLE) ×2 IMPLANT
TOWEL OR 17X26 10 PK STRL BLUE (TOWEL DISPOSABLE) ×2 IMPLANT
TUBE CONNECTING 12X1/4 (SUCTIONS) ×2 IMPLANT
YANKAUER SUCT BULB TIP NO VENT (SUCTIONS) ×2 IMPLANT

## 2013-12-20 NOTE — Anesthesia Preprocedure Evaluation (Addendum)
Anesthesia Evaluation  Patient identified by MRN, date of birth, ID band Patient awake    Reviewed: Allergy & Precautions, H&P , NPO status , Patient's Chart, lab work & pertinent test results  Airway Mallampati: II TM Distance: >3 FB Neck ROM: Full    Dental  (+) Teeth Intact,    Pulmonary sleep apnea ,          Cardiovascular hypertension, Pt. on medications + Peripheral Vascular Disease     Neuro/Psych    GI/Hepatic hiatal hernia, GERD-  Controlled,  Endo/Other  diabetes, Well Controlled, Type 2, Oral Hypoglycemic AgentsMorbid obesity  Renal/GU      Musculoskeletal  (+) Fibromyalgia -  Abdominal   Peds  Hematology   Anesthesia Other Findings   Reproductive/Obstetrics                         Anesthesia Physical Anesthesia Plan  ASA: III  Anesthesia Plan: General   Post-op Pain Management:    Induction: Intravenous  Airway Management Planned: LMA  Additional Equipment:   Intra-op Plan:   Post-operative Plan: Extubation in OR  Informed Consent: I have reviewed the patients History and Physical, chart, labs and discussed the procedure including the risks, benefits and alternatives for the proposed anesthesia with the patient or authorized representative who has indicated his/her understanding and acceptance.   Dental advisory given  Plan Discussed with: CRNA and Surgeon  Anesthesia Plan Comments:        Anesthesia Quick Evaluation

## 2013-12-20 NOTE — Anesthesia Procedure Notes (Signed)
Procedure Name: LMA Insertion Date/Time: 12/20/2013 9:45 AM Performed by: Rush Farmer E Pre-anesthesia Checklist: Patient identified, Emergency Drugs available, Suction available, Patient being monitored and Timeout performed Patient Re-evaluated:Patient Re-evaluated prior to inductionOxygen Delivery Method: Circle system utilized Preoxygenation: Pre-oxygenation with 100% oxygen Intubation Type: IV induction Ventilation: Mask ventilation without difficulty LMA: LMA inserted LMA Size: 4.0 Number of attempts: 1 Placement Confirmation: positive ETCO2 and breath sounds checked- equal and bilateral Tube secured with: Tape Dental Injury: Teeth and Oropharynx as per pre-operative assessment

## 2013-12-20 NOTE — Transfer of Care (Signed)
Immediate Anesthesia Transfer of Care Note  Patient: [name]  Procedure(s) Performed: Procedure(s): EXCISION RIGHT BREAST MASS WITH NEEDLE LOCALIZATION (Right)  Patient Location: PACU  Anesthesia Type:General  Level of Consciousness: awake, alert  and oriented  Airway & Oxygen Therapy: Patient Spontanous Breathing and Patient connected to nasal cannula oxygen  Post-op Assessment: Report given to PACU RN, Post -op Vital signs reviewed and stable and Patient moving all extremities X 4  Post vital signs: Reviewed and stable  Complications: No apparent anesthesia complications

## 2013-12-20 NOTE — H&P (Signed)
[name] is an 58 y.o. female.   Chief Complaint: right abnormal mammogram HPI:  Pt is a 58 yo F with a history of breast cancer who presents with abnormal mammogram.  Needle biopsy was disconcordant.  She presents for excision.  The lesion has been vaguely palpable, but is not discrete.    Past Medical History  Diagnosis Date  . HYPERLIPIDEMIA 05/11/2008  . OBSTRUCTIVE SLEEP APNEA 05/11/2008  . RASH-NONVESICULAR 06/29/2008  . COLONIC POLYPS, HX OF 05/11/2008  . GENITAL HERPES, HX OF 08/08/2009  . Inverted nipple   . Menorrhagia   . 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 LLQ pain   . Hernia   . Low iron   . H/O gonorrhea   . Bacterial infection   . Trichomonas   . H/O irritable bowel syndrome   . Breast cancer     2012  . Hypertension   . Arthritis   . Abscess of buttock   . Diabetes mellitus without complication   . Allergy   . Dysrhythmia   . H/O hiatal hernia   . Anemia     Past Surgical History  Procedure Laterality Date  . Cesarean section    . Right achilles tendon      and left  . S/p ear surgury    . Right ovarian cyst      hx  . S/p extra uterine fibroid  2006  . S/p left knee replacement  2007  . Dilation and curettage of uterus    . Uterine fibroid surgery  2006  . Breast cyst excision  1973  . Breast lumpectomy    . Axillary lymph node dissection    . Colonoscopy      Family History  Problem Relation Age of Onset  . Diabetes Mother   . Hypertension Mother   . Stroke Mother   . Heart disease Father     COPD  . Alcohol abuse Father     ETOH dependence  . Colon cancer Neg Hx   . Cancer Maternal Aunt     breast   Social History:  reports that she has never smoked. She has never used smokeless tobacco. She reports that she drinks alcohol. She reports that she does not use illicit drugs.  Allergies:  Allergies  Allergen Reactions  . Codeine Nausea Only  . Cymbalta [Duloxetine Hcl] Other (See Comments)    Head felt funny, ? thinking  not right  . Darvon Nausea Only  . Propoxyphene Hcl Nausea Only    Medications Prior to Admission  Medication Sig Dispense Refill  . calcium carbonate (OS-CAL - DOSED IN MG OF ELEMENTAL CALCIUM) 1250 MG tablet Take 1 tablet by mouth.      . ciclopirox (PENLAC) 8 % solution Apply topically at bedtime. Apply over nail and surrounding skin. Apply daily over previous coat. After seven (7) days, may remove with alcohol and continue cycle.  6.6 mL  11  . clotrimazole-betamethasone (LOTRISONE) cream Apply topically 2 (two) times daily.  45 g  1  . Glucosamine-Vitamin D (GLUCOSAMINE PLUS VITAMIN D PO) Take 1 tablet by mouth 2 (two) times daily.      [name] ibuprofen (ADVIL,MOTRIN) 200 MG tablet Take 400 mg by mouth daily as needed (pain).      [name] liver oil-zinc oxide (DESITIN) 40 % ointment Apply 1 application topically as needed for irritation.      . metFORMIN (GLUCOPHAGE XR) 500 MG 24 hr tablet Take 1 tablet (500 mg  total) by mouth daily with breakfast.  90 tablet  3  . tamoxifen (NOLVADEX) 20 MG tablet Take 1 tablet (20 mg total) by mouth daily.  90 tablet  3  . telmisartan-hydrochlorothiazide (MICARDIS HCT) 80-12.5 MG per tablet Take 1 tablet by mouth daily.  90 tablet  3  . valACYclovir (VALTREX) 1000 MG tablet Take 1,000 mg by mouth daily as needed (herpes labialis).       . vitamin C (ASCORBIC ACID) 500 MG tablet Take 500 mg by mouth daily.        No results found for this or any previous visit (from the past 48 hour(s)). No results found.  Review of Systems  All other systems reviewed and are negative.    There were no vitals taken for this visit. Physical Exam  Constitutional: She is oriented to person, place, and time. She appears well-developed and well-nourished. No distress.  HENT:  Head: Normocephalic and atraumatic.  Eyes: Conjunctivae are normal. Pupils are equal, round, and reactive to light. No scleral icterus.  Cardiovascular: Normal rate.   Respiratory: Effort normal. No  respiratory distress.  GI: Soft.  Neurological: She is alert and oriented to person, place, and time.  Skin: Skin is warm and dry. She is not diaphoretic.  Psychiatric: She has a normal mood and affect. Her behavior is normal. Judgment and thought content normal.     Assessment/Plan Right abnormal mammogram with disconcordant bx Plan needle localized excisional biopsy. The surgical procedure was reviewed with the patient. The risks and benefits of the procedure were described to the patient and she wishes to proceed.   We discussed the risks bleeding, infection, damage to other structures, need for further procedures/surgeries.      Lakina Mcintire 12/20/2013, 8:55 AM

## 2013-12-20 NOTE — Preoperative (Signed)
Beta Blockers   Reason not to administer Beta Blockers:Not Applicable 

## 2013-12-20 NOTE — Anesthesia Postprocedure Evaluation (Signed)
Anesthesia Post Note  Patient: [name]  Procedure(s) Performed: Procedure(s) (LRB): EXCISION RIGHT BREAST MASS WITH NEEDLE LOCALIZATION (Right)  Anesthesia type: general  Patient location: PACU  Post pain: Pain level controlled  Post assessment: Patient's Cardiovascular Status Stable  Last Vitals:  Filed Vitals:   12/20/13 1110  BP: 150/95  Pulse: 62  Temp: 36.5 C  Resp:     Post vital signs: Reviewed and stable  Level of consciousness: sedated  Complications: No apparent anesthesia complications

## 2013-12-20 NOTE — Discharge Instructions (Signed)
[facility] °Office Phone Number [phone] ° °BREAST BIOPSY/ PARTIAL MASTECTOMY: POST OP INSTRUCTIONS ° °Always review your discharge instruction sheet given to you by the facility where your surgery was performed. ° °IF YOU HAVE DISABILITY OR FAMILY LEAVE FORMS, YOU MUST BRING THEM TO THE OFFICE FOR PROCESSING.  DO NOT GIVE THEM TO YOUR DOCTOR. ° °1. A prescription for pain medication may be given to you upon discharge.  Take your pain medication as prescribed, if needed.  If narcotic pain medicine is not needed, then you may take acetaminophen (Tylenol) or ibuprofen (Advil) as needed. °2. Take your usually prescribed medications unless otherwise directed °3. If you need a refill on your pain medication, please contact your pharmacy.  They will contact our office to request authorization.  Prescriptions will not be filled after 5pm or on week-ends. °4. You should eat very light the first 24 hours after surgery, such as soup, crackers, pudding, etc.  Resume your normal diet the day after surgery. °5. Most patients will experience some swelling and bruising in the breast.  Ice packs and a good support bra will help.  Swelling and bruising can take several days to resolve.  °6. It is common to experience some constipation if taking pain medication after surgery.  Increasing fluid intake and taking a stool softener will usually help or prevent this problem from occurring.  A mild laxative (Milk of Magnesia or Miralax) should be taken according to package directions if there are no bowel movements after 48 hours. °7. Unless discharge instructions indicate otherwise, you may remove your bandages 48 hours after surgery, and you may shower at that time.  You may have steri-strips (small skin tapes) in place directly over the incision.  These strips should be left on the skin for 7-10 days.   Any sutures or staples will be removed at the office during your follow-up visit. °8. ACTIVITIES:  You may resume  regular daily activities (gradually increasing) beginning the next day.  Wearing a good support bra or sports bra (or the breast binder) minimizes pain and swelling.  You may have sexual intercourse when it is comfortable. °a. You may drive when you no longer are taking prescription pain medication, you can comfortably wear a seatbelt, and you can safely maneuver your car and apply brakes. °b. RETURN TO WORK:  __________1 week_______________ °9. You should see your doctor in the office for a follow-up appointment approximately two weeks after your surgery.  Your doctor’s nurse will typically make your follow-up appointment when she calls you with your pathology report.  Expect your pathology report 2-3 business days after your surgery.  You may call to check if you do not hear from us after three days. ° ° °WHEN TO CALL YOUR DOCTOR: °1. Fever over 101.0 °2. Nausea and/or vomiting. °3. Extreme swelling or bruising. °4. Continued bleeding from incision. °5. Increased pain, redness, or drainage from the incision. ° °The clinic staff is available to answer your questions during regular business hours.  Please don’t hesitate to call and ask to speak to one of the nurses for clinical concerns.  If you have a medical emergency, go to the nearest emergency room or call 911.  A surgeon from [facility] is always on call at the hospital. ° °For further questions, please visit [email or web address]  ° °

## 2013-12-20 NOTE — Op Note (Signed)
Right Breast needle needle localized excisional breast biopsy  Indications: This patient presents with history of Left breast cancer, now with abnormal right mammogram  Pre-operative Diagnosis: abnormal right mammogram  Post-operative Diagnosis: abnormal right mammogram  Surgeon: Stark Klein   Assistants: n/a  Anesthesia: General LMA anesthesia  ASA Class: 3  Procedure Details  The patient was seen in the Holding Room. The risks, benefits, complications, treatment options, and expected outcomes were discussed with the patient. The possibilities of reaction to medication, pulmonary aspiration, bleeding, infection, the need for additional procedures, failure to diagnose a condition, and creating a complication requiring transfusion or operation were discussed with the patient. The patient concurred with the proposed plan, giving informed consent.  The site of surgery properly noted/marked. The patient was taken to Operating Room # 2, identified, and the procedure verified as right needle localized excisional breast biopsy. A Time Out was held and the above information confirmed.  After induction of anesthesia, the right breast, and chest were prepped and draped in standard fashion.  The biopsy was performed by creating an transverse incision over the upper outer quadrant of the breast around the previously placed localization guidewire.  Dissection was carried down below the wire.  The specimen was inked with the margin marker paint kit.  Specimen radiography confirmed inclusion of the mammographic lesion.  Hemostasis was achieved with cautery.    The wound was irrigated and closed with a 3-0 Vicryl deep dermal interrupted and a 4-0 Monocryl subcuticular closure in layers.    Sterile dressings were applied. At the end of the operation, all sponge, instrument, and needle counts were correct.  Findings: grossly clear surgical margins,   Estimated Blood Loss:  Minimal         Drains: n/a      Specimens: right needle localized excisional breast biopsy                Complications:  None; patient tolerated the procedure well.         Disposition: PACU - hemodynamically stable.         Condition: stable

## 2013-12-21 ENCOUNTER — Telehealth (INDEPENDENT_AMBULATORY_CARE_PROVIDER_SITE_OTHER): Payer: Self-pay

## 2013-12-21 ENCOUNTER — Encounter (HOSPITAL_COMMUNITY): Payer: Self-pay | Admitting: General Surgery

## 2013-12-21 NOTE — Telephone Encounter (Signed)
Pt made aware of benign breast biopsy results.

## 2013-12-22 ENCOUNTER — Telehealth (INDEPENDENT_AMBULATORY_CARE_PROVIDER_SITE_OTHER): Payer: Self-pay

## 2013-12-22 NOTE — Telephone Encounter (Signed)
[facility] pt's appt is 01/05/14 at 11:45 with Dr.Byerly.

## 2013-12-22 NOTE — Telephone Encounter (Signed)
Pt would like to return to work on 01/01/14.  Will get the ok from Dr. [name] and contact the patient early next week.

## 2013-12-25 ENCOUNTER — Telehealth: Payer: Self-pay | Admitting: *Deleted

## 2013-12-25 ENCOUNTER — Encounter (INDEPENDENT_AMBULATORY_CARE_PROVIDER_SITE_OTHER): Payer: 59 | Admitting: General Surgery

## 2013-12-25 DIAGNOSIS — C50912 Malignant neoplasm of unspecified site of left female breast: Secondary | ICD-10-CM

## 2013-12-25 MED ORDER — METFORMIN HCL ER 500 MG PO TB24: 500.0000 mg | ORAL_TABLET | Freq: Every day | ORAL | Status: DC

## 2013-12-25 MED ORDER — TAMOXIFEN CITRATE 20 MG PO TABS: 20.0000 mg | ORAL_TABLET | Freq: Every day | ORAL | Status: DC

## 2013-12-25 MED ORDER — TELMISARTAN-HCTZ 80-12.5 MG PO TABS: 1.0000 | ORAL_TABLET | Freq: Every day | ORAL | Status: DC

## 2013-12-25 NOTE — Telephone Encounter (Signed)
Patient phoned stating that her tamoxifen, metformin, and her telmisartan were not sent to her local pharmacy (thinks they were sent to mail order RX instead).  Re-submitted to local pharmacy for refill.

## 2013-12-27 ENCOUNTER — Other Ambulatory Visit: Payer: Self-pay | Admitting: Internal Medicine

## 2013-12-29 ENCOUNTER — Encounter (INDEPENDENT_AMBULATORY_CARE_PROVIDER_SITE_OTHER): Payer: Self-pay

## 2014-01-03 ENCOUNTER — Encounter (INDEPENDENT_AMBULATORY_CARE_PROVIDER_SITE_OTHER): Payer: Self-pay

## 2014-01-05 ENCOUNTER — Encounter (INDEPENDENT_AMBULATORY_CARE_PROVIDER_SITE_OTHER): Payer: Self-pay | Admitting: General Surgery

## 2014-01-05 ENCOUNTER — Ambulatory Visit (INDEPENDENT_AMBULATORY_CARE_PROVIDER_SITE_OTHER): Payer: 59 | Admitting: General Surgery

## 2014-01-05 VITALS — BP 128/74 | HR 80 | Temp 98.3°F | Resp 16 | Ht <= 58 in | Wt 243.0 lb

## 2014-01-05 DIAGNOSIS — N63 Unspecified lump in unspecified breast: Secondary | ICD-10-CM

## 2014-01-05 DIAGNOSIS — N6310 Unspecified lump in the right breast, unspecified quadrant: Secondary | ICD-10-CM

## 2014-01-05 NOTE — Progress Notes (Signed)
HISTORY: Pt is around 3 weeks s/p excision of right breast mass.  She is still a bit sore, but is overall well.  She denies fever/chills.  She is still taking occasional pain meds.      EXAM: General:  Alert and oriented.   Incision:  Small seroma.  No cellulitis or warmth.     PATHOLOGY: Diagnosis Breast, excision, Right - ONE BENIGN INTRAMAMMARY LYMPH NODE (1/1). - NEGATIVE FOR MALIGNANCY.   ASSESSMENT AND PLAN:   Breast mass, right No evidence of surgical complications.  Follow up in 1 year.  Resume yearly mammograms.        Milus Height, MD Surgical Oncology, [facility]  Cathlean Cower, MD Biagio Borg, MD

## 2014-01-05 NOTE — Assessment & Plan Note (Signed)
No evidence of surgical complications.  Follow up in 1 year.  Resume yearly mammograms.

## 2014-01-05 NOTE — Patient Instructions (Signed)
Follow up in 1 year.    Go back to regularly schedule mammograms.

## 2014-01-10 ENCOUNTER — Encounter (INDEPENDENT_AMBULATORY_CARE_PROVIDER_SITE_OTHER): Payer: Self-pay

## 2014-02-06 ENCOUNTER — Encounter (INDEPENDENT_AMBULATORY_CARE_PROVIDER_SITE_OTHER): Payer: Self-pay | Admitting: Surgery

## 2014-02-06 ENCOUNTER — Ambulatory Visit (INDEPENDENT_AMBULATORY_CARE_PROVIDER_SITE_OTHER): Payer: 59 | Admitting: Surgery

## 2014-02-06 ENCOUNTER — Telehealth (INDEPENDENT_AMBULATORY_CARE_PROVIDER_SITE_OTHER): Payer: Self-pay

## 2014-02-06 VITALS — BP 124/72 | HR 73 | Temp 98.1°F | Ht 59.0 in | Wt 236.6 lb

## 2014-02-06 DIAGNOSIS — N611 Abscess of the breast and nipple: Secondary | ICD-10-CM

## 2014-02-06 DIAGNOSIS — N61 Mastitis without abscess: Secondary | ICD-10-CM

## 2014-02-06 MED ORDER — CEPHALEXIN 250 MG PO CAPS: 250.0000 mg | ORAL_CAPSULE | Freq: Four times a day (QID) | ORAL | Status: DC

## 2014-02-06 NOTE — Telephone Encounter (Signed)
Patient states right breast incision is swollen and red, urg appt with Dr. [name] 02-07-14 230p

## 2014-02-06 NOTE — Progress Notes (Signed)
Patient Dr. [name] is status post right lumpectomy On 12/20/13. She comes to urgent office today with progressive tenderness and swelling of her right breast incision. This has begun to protrude. There is mild erythema of the skin. She states that it is quite tender.  We prepped the skin with Betadine and anesthetized with 1% lidocaine. I made a 2 cm incision. A small amount of purulent drainage came out. I probed the wound and it did not seem to track very deeply. It was packed with quarter-inch Nu Gauze. Dry dressing was applied. She will remove Nu Gauze and treat his arrhythmias performed. I gave her a week's supply of Keflex. Followup one week with Dr. [name].  [name]. [name], MD, [facility]  General/ Trauma Surgery  02/06/2014 1:59 PM

## 2014-02-07 ENCOUNTER — Encounter (INDEPENDENT_AMBULATORY_CARE_PROVIDER_SITE_OTHER): Payer: 59 | Admitting: Surgery

## 2014-02-12 ENCOUNTER — Ambulatory Visit (INDEPENDENT_AMBULATORY_CARE_PROVIDER_SITE_OTHER): Payer: 59 | Admitting: General Surgery

## 2014-02-12 ENCOUNTER — Encounter (INDEPENDENT_AMBULATORY_CARE_PROVIDER_SITE_OTHER): Payer: Self-pay | Admitting: General Surgery

## 2014-02-12 VITALS — BP 116/80 | HR 72 | Temp 97.0°F | Resp 14 | Wt 237.2 lb

## 2014-02-12 DIAGNOSIS — C50912 Malignant neoplasm of unspecified site of left female breast: Secondary | ICD-10-CM

## 2014-02-12 DIAGNOSIS — C50919 Malignant neoplasm of unspecified site of unspecified female breast: Secondary | ICD-10-CM

## 2014-02-12 MED ORDER — DOXYCYCLINE HYCLATE 100 MG PO TABS: 100.0000 mg | ORAL_TABLET | Freq: Two times a day (BID) | ORAL | Status: DC

## 2014-02-12 NOTE — Assessment & Plan Note (Signed)
Does not appear to have residual abscess.  Still a slight bit of warmth, but no fluctuance or erythema. Switch antibiotics to doxycycline.  Follow up in 1 week.

## 2014-02-12 NOTE — Progress Notes (Signed)
HISTORY: Pt is around 7 weeks post op from lumpectomy which was benign.  She had delayed infection, and Dr. [name] had to open wound and pack last week.  She is doing better, but still feels some firmness there.  No fever/chills.      EXAM: General:  Alert and oriented.   Incision:  Sl warmth, but no erythema.  No fluctuance.     PATHOLOGY: Diagnosis Breast, excision, Right - ONE BENIGN INTRAMAMMARY LYMPH NODE (1/1). - NEGATIVE FOR MALIGNANCY.   ASSESSMENT AND PLAN:   L breast cancer, pT1c, N62mic, Mx, s/p L BCT 02/10/2011, s/p XRT Does not appear to have residual abscess.  Still a slight bit of warmth, but no fluctuance or erythema. Switch antibiotics to doxycycline.  Follow up in 1 week.        Milus Height, MD Surgical Oncology, [facility]  Cathlean Cower, MD Biagio Borg, MD

## 2014-02-12 NOTE — Patient Instructions (Signed)
Ok to use heat and warm compresses on right breast.  If you desire, you can also use antibiotic ointment.    Switch antibiotics.    Follow up in 1 week.

## 2014-02-19 ENCOUNTER — Encounter (INDEPENDENT_AMBULATORY_CARE_PROVIDER_SITE_OTHER): Payer: Self-pay | Admitting: General Surgery

## 2014-02-19 ENCOUNTER — Ambulatory Visit (INDEPENDENT_AMBULATORY_CARE_PROVIDER_SITE_OTHER): Payer: 59 | Admitting: General Surgery

## 2014-02-19 VITALS — BP 134/74 | HR 75 | Temp 98.5°F | Resp 16 | Ht 59.0 in | Wt 235.4 lb

## 2014-02-19 DIAGNOSIS — N6310 Unspecified lump in the right breast, unspecified quadrant: Secondary | ICD-10-CM

## 2014-02-19 DIAGNOSIS — N63 Unspecified lump in unspecified breast: Secondary | ICD-10-CM

## 2014-02-19 NOTE — Patient Instructions (Signed)
Finish out antibiotics  Follow up in 2 weeks.  No longer have to cover wound.

## 2014-02-19 NOTE — Assessment & Plan Note (Signed)
Pt is still concerned about infection, although clinically it appears resolved.  She will finish out her antibiotic and follow up with me in 2 weeks once she is off antibiotics.

## 2014-02-19 NOTE — Progress Notes (Signed)
HISTORY: Pt is around 8 weeks post op from lumpectomy which was benign.  She had delayed infection, and Dr. [name] had to open wound and pack several weeks ago. She denies fever/chills.  She is still having some soreness. She denies drainage.  She still feels some firmness in the area.  She is having some pain in the axilla.        EXAM: General:  Alert and oriented.   Incision:  Warmth resolved.  Breast soft other than scar tissue right at incision.  No fluctuance.  Shotty LAD.     PATHOLOGY: Diagnosis Breast, excision, Right - ONE BENIGN INTRAMAMMARY LYMPH NODE (1/1). - NEGATIVE FOR MALIGNANCY.   ASSESSMENT AND PLAN:   Breast mass, right Pt is still concerned about infection, although clinically it appears resolved.  She will finish out her antibiotic and follow up with me in 2 weeks once she is off antibiotics.         Milus Height, MD Surgical Oncology, [facility]  Cathlean Cower, MD Biagio Borg, MD

## 2014-02-23 ENCOUNTER — Telehealth: Payer: Self-pay | Admitting: Oncology

## 2014-02-23 NOTE — Telephone Encounter (Signed)
, °

## 2014-02-27 ENCOUNTER — Telehealth (INDEPENDENT_AMBULATORY_CARE_PROVIDER_SITE_OTHER): Payer: Self-pay

## 2014-02-27 NOTE — Telephone Encounter (Signed)
LMOV.  Pt had requested a letter to excuse her from jury duty for 03/05/14.  She is over 8 wks out from surgery, is not receiving any treatment at this time, and is back at work.  Dr. [name] denied the request.

## 2014-03-02 ENCOUNTER — Other Ambulatory Visit (HOSPITAL_BASED_OUTPATIENT_CLINIC_OR_DEPARTMENT_OTHER): Payer: 59

## 2014-03-02 ENCOUNTER — Other Ambulatory Visit: Payer: Self-pay | Admitting: *Deleted

## 2014-03-02 DIAGNOSIS — C50912 Malignant neoplasm of unspecified site of left female breast: Secondary | ICD-10-CM

## 2014-03-02 DIAGNOSIS — D509 Iron deficiency anemia, unspecified: Secondary | ICD-10-CM

## 2014-03-02 DIAGNOSIS — C50119 Malignant neoplasm of central portion of unspecified female breast: Secondary | ICD-10-CM

## 2014-03-02 LAB — COMPREHENSIVE METABOLIC PANEL (CC13)
ALT: 13 U/L (ref 0–55)
AST: 15 U/L (ref 5–34)
Albumin: 3.3 g/dL — ABNORMAL LOW (ref 3.5–5.0)
Alkaline Phosphatase: 42 U/L (ref 40–150)
Anion Gap: 12 mEq/L — ABNORMAL HIGH (ref 3–11)
BUN: 12.1 mg/dL (ref 7.0–26.0)
CO2: 23 mEq/L (ref 22–29)
Calcium: 9.6 mg/dL (ref 8.4–10.4)
Chloride: 106 mEq/L (ref 98–109)
Creatinine: 0.8 mg/dL (ref 0.6–1.1)
Glucose: 108 mg/dl (ref 70–140)
Potassium: 3.7 mEq/L (ref 3.5–5.1)
Sodium: 141 mEq/L (ref 136–145)
Total Bilirubin: 0.39 mg/dL (ref 0.20–1.20)
Total Protein: 7.8 g/dL (ref 6.4–8.3)

## 2014-03-02 LAB — CBC WITH DIFFERENTIAL/PLATELET
BASO%: 0.5 % (ref 0.0–2.0)
Basophils Absolute: 0 10*3/uL (ref 0.0–0.1)
EOS%: 1.4 % (ref 0.0–7.0)
Eosinophils Absolute: 0.1 10*3/uL (ref 0.0–0.5)
HCT: 38 % (ref 34.8–46.6)
HGB: 12.1 g/dL (ref 11.6–15.9)
LYMPH%: 30.9 % (ref 14.0–49.7)
MCH: 27.7 pg (ref 25.1–34.0)
MCHC: 31.9 g/dL (ref 31.5–36.0)
MCV: 86.9 fL (ref 79.5–101.0)
MONO#: 0.5 10*3/uL (ref 0.1–0.9)
MONO%: 9.4 % (ref 0.0–14.0)
NEUT#: 2.9 10*3/uL (ref 1.5–6.5)
NEUT%: 57.8 % (ref 38.4–76.8)
Platelets: 233 10*3/uL (ref 145–400)
RBC: 4.38 10*6/uL (ref 3.70–5.45)
RDW: 14.6 % — ABNORMAL HIGH (ref 11.2–14.5)
WBC: 5.1 10*3/uL (ref 3.9–10.3)
lymph#: 1.6 10*3/uL (ref 0.9–3.3)

## 2014-03-05 ENCOUNTER — Other Ambulatory Visit: Payer: 59

## 2014-03-06 ENCOUNTER — Encounter (INDEPENDENT_AMBULATORY_CARE_PROVIDER_SITE_OTHER): Payer: Self-pay | Admitting: General Surgery

## 2014-03-06 ENCOUNTER — Ambulatory Visit (INDEPENDENT_AMBULATORY_CARE_PROVIDER_SITE_OTHER): Payer: 59 | Admitting: General Surgery

## 2014-03-06 VITALS — BP 126/80 | HR 74 | Temp 98.5°F | Ht 61.0 in | Wt 233.0 lb

## 2014-03-06 DIAGNOSIS — N6310 Unspecified lump in the right breast, unspecified quadrant: Secondary | ICD-10-CM

## 2014-03-06 DIAGNOSIS — N63 Unspecified lump in unspecified breast: Secondary | ICD-10-CM

## 2014-03-06 NOTE — Patient Instructions (Signed)
Follow up in 6 months 

## 2014-03-06 NOTE — Assessment & Plan Note (Signed)
Infection resolved.    Follow up in 6 months for repeat exam.

## 2014-03-06 NOTE — Progress Notes (Signed)
HISTORY: Pt is around 10 weeks post op from lumpectomy which was benign.  She had delayed infection, and Dr. [name] had to open wound and pack several weeks ago. Pt stated that she is feeling better.  She is no longer having pain.  She does still feel a bit of a "knot in the wound."  No fever/chills.      EXAM: General:  Alert and oriented.   Incision:  Breast continues to soften up.  No residual infection.       PATHOLOGY: Diagnosis Breast, excision, Right - ONE BENIGN INTRAMAMMARY LYMPH NODE (1/1). - NEGATIVE FOR MALIGNANCY.   ASSESSMENT AND PLAN:   Breast mass, right Infection resolved.    Follow up in 6 months for repeat exam.         Milus Height, MD Surgical Oncology, [facility]  Cathlean Cower, MD Biagio Borg, MD

## 2014-03-12 ENCOUNTER — Ambulatory Visit: Payer: 59 | Admitting: Oncology

## 2014-03-12 LAB — HM MAMMOGRAPHY: HM Mammogram: NEGATIVE

## 2014-03-14 ENCOUNTER — Telehealth: Payer: Self-pay | Admitting: Oncology

## 2014-03-14 NOTE — Telephone Encounter (Signed)
pt cld to see when next appt was-adv it was 6/1 @9 -pt understood

## 2014-03-19 ENCOUNTER — Ambulatory Visit (HOSPITAL_BASED_OUTPATIENT_CLINIC_OR_DEPARTMENT_OTHER): Payer: 59 | Admitting: Oncology

## 2014-03-19 ENCOUNTER — Telehealth: Payer: Self-pay | Admitting: Oncology

## 2014-03-19 VITALS — BP 141/85 | HR 67 | Temp 98.4°F | Resp 20 | Ht 61.0 in | Wt 237.0 lb

## 2014-03-19 DIAGNOSIS — Z86010 Personal history of colonic polyps: Secondary | ICD-10-CM

## 2014-03-19 DIAGNOSIS — Z170 Estrogen receptor positive status [ER+]: Secondary | ICD-10-CM

## 2014-03-19 DIAGNOSIS — C50119 Malignant neoplasm of central portion of unspecified female breast: Secondary | ICD-10-CM

## 2014-03-19 DIAGNOSIS — C50912 Malignant neoplasm of unspecified site of left female breast: Secondary | ICD-10-CM

## 2014-03-19 DIAGNOSIS — Z87898 Personal history of other specified conditions: Secondary | ICD-10-CM

## 2014-03-19 DIAGNOSIS — D509 Iron deficiency anemia, unspecified: Secondary | ICD-10-CM

## 2014-03-19 MED ORDER — TAMOXIFEN CITRATE 20 MG PO TABS: 20.0000 mg | ORAL_TABLET | Freq: Every day | ORAL | Status: DC

## 2014-03-19 NOTE — Telephone Encounter (Signed)
per pof to sch appts-sch-gqve pt copy of sch

## 2014-03-19 NOTE — Progress Notes (Signed)
ID: [name]   DOB: 15-Sep-1956  MR#: [id number]  CSN#:[id number]   PCP: Cathlean Cower, MD GYN:  Kendall Flack, MD SU:  Excell Seltzer, MD Other:  Thea Silversmith, MD;  Jari Pigg, MD;  Renato Shin, MD;  Lowella Bandy, MD;  Erskine Emery, MD  CHIEF COMPLAINT:  Left Breast Cancer TREATMENT: receiving active anti-estrogen therapy  HISTORY OF PRESENT ILLNESS: From the original intake note:  The patient had screening mammography in January 16, 2011 (she missed her mammogram on January 05, 2010) and this showed some changes in the left breast, which measured 1.1 cm.  Left breast ultrasound was obtained the same day and confirmed a small hypoechoic lobular area of nodularity measuring again 1.1 cm.  There was no other abnormality and the patient was set up for ultrasound-guided needle biopsy January 22, 2011.  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  Tumor was estrogen receptor positive at 82%, progesterone receptor positive at 92% with MIB-1 of 17% and no evidence of HER2 amplification, the ratio by CISH being 0.97.  With this information the patient was set up for bilateral breast MRI s and these were obtained January 29, 2011.  In the central left breast in the retroareolar location there was a spiculated enhancing mass with a regular margins measuring again 1.1 cm.  There were no other areas of abnormality.  She had her left lumpectomy and sentinel lymph node sampling on February 10, 2011.  The pathology report ([id number]) shows an 8-mm invasive ductal carcinoma, grade 1, involving 1/2 sentinel lymph nodes with a micrometastatic deposit.  The inferior margin was apparently broadly positive initially, but may have been cleared with subsequent excision in the same procedure.  The pathology report does not commit, but says the margin has been "likely cleared."   Her Oncotype DX gave her a recurrence score of 21%, predicting a  risk of recurrent for node positive patients in the 12% range.  She decided to forego chemotherapy, but proceded with radiation therapy. She completed radiation in August 2012, and started on tamoxifen the first week of September 2012.   Her subsequent history is as detailed below.  INTERVAL HISTORY: Burnis returns today for followup of her left breast cancer. Since her last visit here she had the mass in her right breast biopsy. The procedure on 12/20/2013 showed ([id number]) a benign intramammary lymph node. Unfortunately this was complicated by infection and it took her about a month to get over. However a repeat mammography 01/12/2014 was unremarkable.  REVIEW OF SYSTEMS: Malary is tolerating the tamoxifen well. She doesn't have many hot flashes but she does have night sweats. What keeps her up at night so is nocturia. She has some bilateral lower extremity swelling. This is not new. She has a history of hiatal hernia with some reflux issues, stress urinary incontinence, psoriasis, back and joint pain, which is not more intense or persistent than before, and significant left knee pain, which she cannot get repaired because she does money to the orthopedists, she tells me. Her diabetes is moderately well-controlled. A detailed review of systems today was otherwise stable  PAST MEDICAL HISTORY: Past Medical History  Diagnosis Date  . HYPERLIPIDEMIA 05/11/2008  . OBSTRUCTIVE SLEEP APNEA 05/11/2008  . RASH-NONVESICULAR 06/29/2008  . COLONIC POLYPS, HX OF 05/11/2008  . GENITAL HERPES, HX OF 08/08/2009  . Inverted nipple   . Menorrhagia   . 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  LLQ pain   . Hernia   . Low iron   . H/O gonorrhea   . Bacterial infection   . Trichomonas   . H/O irritable bowel syndrome   . Breast cancer     2012  . Hypertension   . Arthritis   . Abscess of buttock   . Diabetes mellitus without complication   . Allergy   . Dysrhythmia   . H/O hiatal hernia   . Anemia   Is  significant for hypertension, obesity, early glaucoma, removal of a right breast cysts in 1972, total left knee replacement in 2007, possible irritable bowel syndrome, history of fibroid uterus surgery in 2006, history of C-section in 1981 and history of remote repair of the right and left Achilles tendons.  PAST SURGICAL HISTORY: Past Surgical History  Procedure Laterality Date  . Cesarean section    . Right achilles tendon      and left  . S/p ear surgury    . Right ovarian cyst      hx  . S/p extra uterine fibroid  2006  . S/p left knee replacement  2007  . Dilation and curettage of uterus    . Uterine fibroid surgery  2006  . Breast cyst excision  1973  . Breast lumpectomy    . Axillary lymph node dissection    . Colonoscopy    . Breast lumpectomy with needle localization Right 12/20/2013    Procedure: EXCISION RIGHT BREAST MASS WITH NEEDLE LOCALIZATION;  Surgeon: Stark Klein, MD;  Location: [facility];  Service: General;  Laterality: Right;    FAMILY HISTORY Family History  Problem Relation Age of Onset  . Diabetes Mother   . Hypertension Mother   . Stroke Mother   . Heart disease Father     COPD  . Alcohol abuse Father     ETOH dependence  . Colon cancer Neg Hx   . Cancer Maternal Aunt     breast  The patient's mother is alive..  The patient's father died in his early 2s from congestive heart failure.  The patient had five brothers and three sisters; most of theses siblings are half siblings, but in any case there is no history of breast or ovarian cancer in the immediate family.  There was one maternal aunt (out of a total of four) diagnosed with breast cancer in her 47s.  GYNECOLOGIC HISTORY: The patient had menarche age 41, she is still having regular periods.  She is Gx, P1, first pregnancy to term at age 89.  She stopped having menstrual periods August of 2012, but had one anomalous. In April of 2013.  SOCIAL HISTORY: Arlyce works as a Education officer, museum for [employer].  She has been divorced more than 10 years and lives by herself at home with no pets.  Her one child, a son, died at age 69.   ADVANCED DIRECTIVES: not in place  HEALTH MAINTENANCE: (Updated 09/04/2013) History  Substance Use Topics  . Smoking status: Never Smoker   . Smokeless tobacco: Never Used  . Alcohol Use: Yes     Comment: occasionally drinks wine     Colonoscopy: May 2013, Dr. Deatra Ina  PAP: UTD/Dr. Leo Grosser  Bone density: Not on file  Lipid panel: April 2014, Dr. Jenny Reichmann   Allergies  Allergen Reactions  . Codeine Nausea Only  . Cymbalta [Duloxetine Hcl] Other (See Comments)    Head felt funny, ? thinking not right  . Darvon Nausea Only  . Propoxyphene  Hcl Nausea Only    Current Outpatient Prescriptions  Medication Sig Dispense Refill  . calcium carbonate (OS-CAL - DOSED IN MG OF ELEMENTAL CALCIUM) 1250 MG tablet Take 1 tablet by mouth.      . ciclopirox (PENLAC) 8 % solution Apply topically at bedtime. Apply over nail and surrounding skin. Apply daily over previous coat. After seven (7) days, may remove with alcohol and continue cycle.  6.6 mL  11  . clotrimazole-betamethasone (LOTRISONE) cream Apply topically 2 (two) times daily.  45 g  1  . doxycycline (VIBRA-TABS) 100 MG tablet Take 1 tablet (100 mg total) by mouth 2 (two) times daily.  20 tablet  0  . Glucosamine-Vitamin D (GLUCOSAMINE PLUS VITAMIN D PO) Take 1 tablet by mouth 2 (two) times daily.      [name] ibuprofen (ADVIL,MOTRIN) 200 MG tablet Take 400 mg by mouth daily as needed (pain).      [name] liver oil-zinc oxide (DESITIN) 40 % ointment Apply 1 application topically as needed for irritation.      . metFORMIN (GLUCOPHAGE-XR) 500 MG 24 hr tablet TAKE 1 TABLET BY MOUTH EVERY DAY WITH BREAKFAST  90 tablet  3  . oxyCODONE-acetaminophen (ROXICET) 5-325 MG per tablet Take 1-2 tablets by mouth every 4 (four) hours as needed for severe pain.  30 tablet  0  . tamoxifen (NOLVADEX) 20 MG tablet Take 1 tablet (20 mg total) by  mouth daily.  90 tablet  3  . telmisartan-hydrochlorothiazide (MICARDIS HCT) 80-12.5 MG per tablet Take 1 tablet by mouth daily.  90 tablet  3  . valACYclovir (VALTREX) 1000 MG tablet Take 1,000 mg by mouth daily as needed (herpes labialis).       . vitamin C (ASCORBIC ACID) 500 MG tablet Take 500 mg by mouth daily.      . [DISCONTINUED] tolterodine (DETROL LA) 4 MG 24 hr capsule Take 4 mg by mouth daily.       No current facility-administered medications for this visit.    OBJECTIVE: Middle-aged [place] American woman who appears stated age 58 Vitals:   03/19/14 0902  BP: 141/85  Pulse: 67  Temp: 98.4 F (36.9 C)  Resp: 20     Body mass index is 44.8 kg/(m^2).    ECOG FS: 1 Filed Weights   03/19/14 0902  Weight: 237 lb (107.502 kg)   Sclerae unicteric, pupils round and equal Oropharynx clear and moist No cervical or supraclavicular adenopathy Lungs no rales or rhonchi Heart regular rate and rhythm Abd soft, obese, nontender, positive bowel sounds MSK no focal spinal tenderness, no upper extremity lymphedema, bilateral ankle edema, grade 1 Neuro: nonfocal, well oriented, appropriate affect Breasts: The right breast is status post lumpectomy and radiation. It is also status post removal of an intramammary lymph node. There is minimal induration under the scar. There are no suspicious skin or nipple findings. The right axilla is benign. Left breast is unremarkable   LAB RESULTS: Lab Results  Component Value Date   WBC 5.1 03/02/2014   NEUTROABS 2.9 03/02/2014   HGB 12.1 03/02/2014   HCT 38.0 03/02/2014   MCV 86.9 03/02/2014   PLT 233 03/02/2014      Chemistry      Component Value Date/Time   NA 141 03/02/2014 0912   NA 141 12/14/2013 0912   K 3.7 03/02/2014 0912   K 3.7 12/14/2013 0912   CL 104 12/14/2013 0912   CL 106 01/18/2013 0909   CO2 23 03/02/2014 0912  CO2 26 12/14/2013 0912   BUN 12.1 03/02/2014 0912   BUN 15 12/14/2013 0912   CREATININE 0.8 03/02/2014 0912    CREATININE 0.66 12/14/2013 0912      Component Value Date/Time   CALCIUM 9.6 03/02/2014 0912   CALCIUM 9.7 12/14/2013 0912   ALKPHOS 42 03/02/2014 0912   ALKPHOS 36* 11/28/2013 1217   AST 15 03/02/2014 0912   AST 16 11/28/2013 1217   ALT 13 03/02/2014 0912   ALT 15 11/28/2013 1217   BILITOT 0.39 03/02/2014 0912   BILITOT 0.6 11/28/2013 1217        STUDIES: Right breast excision 12/20/2013 for an intramammary lymph node was negative for malignancy.[id number]).  Mammography at [facility] 01/12/2014 was negative. ASSESSMENT: 58 y.o.  [place] woman   (1)  status post left lumpectomy and sentinel lymph node dissection April of 2012 for a T1b N1(mic) stage IB invasive ductal carcinoma, grade 1, estrogen receptor 82% and progesterone receptor 92% positive, with no HER-2 amplification, and an MIB-1-1 of 17%,   (2)  The patient's Oncotype DX score of 21 predicts a 13% risk of distant recurrence after 5 years of tamoxifen.  (3)  status post radiation completed August of 2012,   (4)  on tamoxifen from September of 2012 to April 2014  (5) the plan had been to initiate anastrozole in April 2014, but the patient had a menstrual cycle in May 2014, and resumed tamoxifen.   PLAN: The patient isn't having periods but of course it is not possible for me to know if her ovaries are still working or not. We're going to obtain [place], LH and estradiol levels 2 weeks before her next visit here, which will be in 6 months. If she is clearly postmenopausal we will consider switching to anastrozole.  Overall the plan is to continue antiestrogen for 5 years, which is going to take [place] to September of 2017.  Sarafina has a ready participated in the [place] program. I suggested that she joined so why she could do water aerobics which would be terrific for her. Otherwise she can use  compression stockings for a bilateral ankle swelling.  She knows to call for any problems that may develop before her next visit  here.    Chauncey Cruel, MD     03/19/2014

## 2014-03-23 ENCOUNTER — Telehealth: Payer: Self-pay | Admitting: Endocrinology

## 2014-03-23 NOTE — Telephone Encounter (Signed)
Pt advised.

## 2014-03-23 NOTE — Telephone Encounter (Signed)
Pt has pain in her leg - achy and will not go away has been persistent for the past 3 or 4 days has taken ibuprofen-should she come in at the 330 slot today?

## 2014-03-23 NOTE — Telephone Encounter (Signed)
See below and please advise, Thanks!  

## 2014-03-23 NOTE — Telephone Encounter (Signed)
Please ask pcp dr Jenny Reichmann

## 2014-03-27 ENCOUNTER — Ambulatory Visit: Payer: 59 | Admitting: Endocrinology

## 2014-03-27 DIAGNOSIS — Z0289 Encounter for other administrative examinations: Secondary | ICD-10-CM

## 2014-05-30 ENCOUNTER — Telehealth: Payer: Self-pay

## 2014-05-30 NOTE — Telephone Encounter (Signed)
Diabetic Bundle. Lvom for pt to call back and schedule with Dr. Loanne Drilling.

## 2014-06-15 ENCOUNTER — Ambulatory Visit (INDEPENDENT_AMBULATORY_CARE_PROVIDER_SITE_OTHER): Payer: 59

## 2014-06-15 VITALS — BP 142/86 | HR 68 | Resp 12

## 2014-06-15 DIAGNOSIS — M7750 Other enthesopathy of unspecified foot: Secondary | ICD-10-CM

## 2014-06-15 DIAGNOSIS — B070 Plantar wart: Secondary | ICD-10-CM

## 2014-06-15 DIAGNOSIS — E1140 Type 2 diabetes mellitus with diabetic neuropathy, unspecified: Secondary | ICD-10-CM

## 2014-06-15 DIAGNOSIS — M778 Other enthesopathies, not elsewhere classified: Secondary | ICD-10-CM

## 2014-06-15 DIAGNOSIS — E1149 Type 2 diabetes mellitus with other diabetic neurological complication: Secondary | ICD-10-CM

## 2014-06-15 DIAGNOSIS — E1142 Type 2 diabetes mellitus with diabetic polyneuropathy: Secondary | ICD-10-CM

## 2014-06-15 DIAGNOSIS — M21619 Bunion of unspecified foot: Secondary | ICD-10-CM

## 2014-06-15 DIAGNOSIS — M779 Enthesopathy, unspecified: Secondary | ICD-10-CM

## 2014-06-15 DIAGNOSIS — B351 Tinea unguium: Secondary | ICD-10-CM

## 2014-06-15 DIAGNOSIS — R52 Pain, unspecified: Secondary | ICD-10-CM

## 2014-06-15 DIAGNOSIS — B353 Tinea pedis: Secondary | ICD-10-CM

## 2014-06-15 DIAGNOSIS — M2010 Hallux valgus (acquired), unspecified foot: Secondary | ICD-10-CM

## 2014-06-15 MED ORDER — LULICONAZOLE 1 % EX CREA: TOPICAL_CREAM | CUTANEOUS | Status: DC

## 2014-06-15 MED ORDER — EFINACONAZOLE 10 % EX SOLN: CUTANEOUS | Status: DC

## 2014-06-15 NOTE — Progress Notes (Signed)
Subjective:    Patient ID: [name], female    DOB: 1956/06/12, 58 y.o.   MRN: [id number]  HPI PT STATED RT BUNION AND SIDE OF THE FOOT IS SORE FOR 6 YEARS. THE FOOT IS GETTING WORSE. THE GET AGGRAVATED BY WALKING/ PRESSURE. TRIED IBUPROFEN IT HELP.    ALSO, B/L TOENAILS HAVE DISCOLORATION FOR 4 YEARS. THE TOENAILS ARE LOOKING WORSE BUT DOES NOT HURT. TRIED ANTIFUNGAL CREAM BUT NO HELP.   Review of Systems  Constitutional: Positive for fatigue.  Eyes: Positive for visual disturbance.  Cardiovascular: Positive for leg swelling.  Endocrine: Positive for polyuria.  Genitourinary: Positive for urgency and frequency.  Skin: Positive for color change and rash.       Objective:   Physical Exam Is a 58 year old [place] American female presents at this time for initial visit well-developed well-nourished oriented x3 Vital signs stable. Patient has several concerns at this time. #1 she does have a painful lesion lateral fifth MTP area of the right foot has pain along the lateral side of that foot. Patient also is having a painful bunion right great toe joint and sore painful and tender for several months. The bunion deformities more severe on right and left foot. Patient also has what she describes a rash around her feet and ankles which appears to be a moccasin-type distribution of tinea pedis with a macular rash being noted also some thickening discoloration of nails right more so than left especially distal portions of nails and a painful first MTP joint noted. Patient also has a history of diabetes diagnosed with in the past year with possibly some neuropathy she can no longer wear high heels is wearing flat shoes and her feet are having problems.  Lower extremity objective findings as follows vascular status is intact pedal pulses palpable DP +2/4 bilateral PT +2/4 bilateral mild edema +1 to +2 pitting both lower extremities and ankle areas minimal varicosities noted. Capillary  refill time 3 seconds all digits neurologically epicritic and proprioceptive sensations intact and symmetric bilateral there is absence of sensation inferior heel area and sub-fifth MTP area bilateral on Semmes Weinstein testing otherwise normal plantar response and DTRs noted. Dermatologically skin color pigment normal hair growth absent nails criptotic and slightly yellowed hallux and lesser digits right more so than left no open wounds no secondary infections. There is a nucleated keratotic lesion sub-fifth MTP area on the right with dry eschar tissue and hyperkeratoses being noted painful on direct the more significantly on lateral compression of this nucleated lesion. Diffuse keratoses noted otherwise there is clinically orthopedic biomechanical exam notable bunion deformity right more so than left lateral deviation of the hallux and prominence of the  MTP area medially and dorsally. There is limitation of motion of the MTP joints noted clinically x-rays confirm IM angle on the right 14 sesamoid position 6 hallux abductus greater than 30 there is degenerative asymmetric joint space narrowing changes of the TN joint and CC joint as well as mild inferior and posterior calcaneal spurring. Significant +2 edema noted radiographically as well as clinically.       Assessment & Plan:  Assessment #1: Verruca plantaris. Plantar aspect right fifth MTP area. At this time lesion is debrided and packed with 62% salicylic acid under occlusion for 24 hours patient is advised this is a wart caused by a virus should resolve lesion and patient does have some mild neuropathy will avoid any additional applications at home however lesion is about 3 mm in  overall diameter and on debridement had almost complete excision of the core again salicylic acid applied under occlusion for 24 hours as indicated followup in the future for any recurrence if recurrences occurred may consider more aggressive invasive options if  needed  Assessment #2 tinea pedis patient has significant macular distribution of my suspicion of a macular rash with pruritus. Plan at this time patient is placed on antifungal therapy prescription for topical antifungal cream Luzu antifungal cream is prescribed and will be sent from the fillet or fillet or directly to the patient's home will apply the luzu twice daily to the affected areas of both feet for at least a 2 week duration. Next  Assessment #3 onychomycosis of nails 1 through 5 bilateral. This plan of treatment at this time is for topical Gregary Signs antifungal wishes called in to the fillet or pharmacy this medicine molded for directly to the patient's home daily application to each affected toenail one drop daily as instructed. Patient is advised we'll take 12 months of daily treatment with a fungus in the nails.  Assessment #3 HAV deformity/bunion deformity right foot more severe than left. Plan at this time literature on bunion correction is dispensed for patient's future consideration may be candidate for [facility] bunionectomy possibly the steroid injections NSAID therapy appropriate shoes and possibly the orthoses at some point.  Patient's prior to the office to ask questions about diabetic shoes she may be candidate for diabetic shoes at some point in the future currently recommend treating correcting her more significant painful problems and may help with diabetic shoe plan in the future when ready.[name] Reschedule within the next one to 2 months for followup and reevaluation assessment treating the fungal infections in nails and possible future followup for bunion correction and treatment capsulitis and arthritic conditions. Contact us with any other exacerbations occur in the interim.  Harriet Masson DPM

## 2014-06-15 NOTE — Patient Instructions (Signed)
Onychomycosis/Fungal Toenails  WHAT IS IT? An infection that lies within the keratin of your nail plate that is caused by a fungus.  WHY ME? Fungal infections affect all ages, sexes, races, and creeds.  There may be many factors that predispose you to a fungal infection such as age, coexisting medical conditions such as diabetes, or an autoimmune disease; stress, medications, fatigue, genetics, etc.  Bottom line: fungus thrives in a warm, moist environment and your shoes offer such a location.  IS IT CONTAGIOUS? Theoretically, yes.  You do not want to share shoes, nail clippers or files with someone who has fungal toenails.  Walking around barefoot in the same room or sleeping in the same bed is unlikely to transfer the organism.  It is important to realize, however, that fungus can spread easily from one nail to the next on the same foot.  HOW DO WE TREAT THIS?  There are several ways to treat this condition.  Treatment may depend on many factors such as age, medications, pregnancy, liver and kidney conditions, etc.  It is best to ask your doctor which options are available to you.  1. No treatment.   Unlike many other medical concerns, you can live with this condition.  However for many people this can be a painful condition and may lead to ingrown toenails or a bacterial infection.  It is recommended that you keep the nails cut short to help reduce the amount of fungal nail. 2. Topical treatment.  These range from herbal remedies to prescription strength nail lacquers.  About 40-50% effective, topicals require twice daily application for approximately 9 to 12 months or until an entirely new nail has grown out.  The most effective topicals are medical grade medications available through physicians offices. 3. Oral antifungal medications.  With an 80-90% cure rate, the most common oral medication requires 3 to 4 months of therapy and stays in your system for a year as the new nail grows out.  Oral  antifungal medications do require blood work to make sure it is a safe drug for you.  A liver function panel will be performed prior to starting the medication and after the first month of treatment.  It is important to have the blood work performed to avoid any harmful side effects.  In general, this medication safe but blood work is required. 4. Laser Therapy.  This treatment is performed by applying a specialized laser to the affected nail plate.  This therapy is noninvasive, fast, and non-painful.  It is not covered by insurance and is therefore, out of pocket.  The results have been very good with a 80-95% cure rate.  [facility] is the only practice in the area to offer this therapy. 5. Permanent Nail Avulsion.  Removing the entire nail so that a new nail will not grow back.  For fungus nail care apply the prescribed medication Gregary Signs to affected toenails one drop to each toe once daily for 12 months  For the athlete's foot in skin rash of feet and ankle areas applied the Luzu antifungal cream twice daily for 2 weeks to the affected areas       Diabetes and Foot Care Diabetes may cause you to have problems because of poor blood supply (circulation) to your feet and legs. This may cause the skin on your feet to become thinner, break easier, and heal more slowly. Your skin may become dry, and the skin may peel and crack. You may also  have nerve damage in your legs and feet causing decreased feeling in them. You may not notice minor injuries to your feet that could lead to infections or more serious problems. Taking care of your feet is one of the most important things you can do for yourself.  HOME CARE INSTRUCTIONS  Wear shoes at all times, even in the house. Do not go barefoot. Bare feet are easily injured.  Check your feet daily for blisters, cuts, and redness. If you cannot see the bottom of your feet, use a mirror or ask someone for help.  Wash your feet with warm water (do not  use hot water) and mild soap. Then pat your feet and the areas between your toes until they are completely dry. Do not soak your feet as this can dry your skin.  Apply a moisturizing lotion or petroleum jelly (that does not contain alcohol and is unscented) to the skin on your feet and to dry, brittle toenails. Do not apply lotion between your toes.  Trim your toenails straight across. Do not dig under them or around the cuticle. File the edges of your nails with an emery board or nail file.  Do not cut corns or calluses or try to remove them with medicine.  Wear clean socks or stockings every day. Make sure they are not too tight. Do not wear knee-high stockings since they may decrease blood flow to your legs.  Wear shoes that fit properly and have enough cushioning. To break in new shoes, wear them for just a few hours a day. This prevents you from injuring your feet. Always look in your shoes before you put them on to be sure there are no objects inside.  Do not cross your legs. This may decrease the blood flow to your feet.  If you find a minor scrape, cut, or break in the skin on your feet, keep it and the skin around it clean and dry. These areas may be cleansed with mild soap and water. Do not cleanse the area with peroxide, alcohol, or iodine.  When you remove an adhesive bandage, be sure not to damage the skin around it.  If you have a wound, look at it several times a day to make sure it is healing.  Do not use heating pads or hot water bottles. They may burn your skin. If you have lost feeling in your feet or legs, you may not know it is happening until it is too late.  Make sure your health care provider performs a complete foot exam at least annually or more often if you have foot problems. Report any cuts, sores, or bruises to your health care provider immediately. SEEK MEDICAL CARE IF:   You have an injury that is not healing.  You have cuts or breaks in the skin.  You  have an ingrown nail.  You notice redness on your legs or feet.  You feel burning or tingling in your legs or feet.  You have pain or cramps in your legs and feet.  Your legs or feet are numb.  Your feet always feel cold. SEEK IMMEDIATE MEDICAL CARE IF:   There is increasing redness, swelling, or pain in or around a wound.  There is a red line that goes up your leg.  Pus is coming from a wound.  You develop a fever or as directed by your health care provider.  You notice a bad smell coming from an ulcer or wound. Document Released:  10/02/2000 Document Revised: 06/07/2013 Document Reviewed: 03/14/2013 [facility] Patient Information 2015 [place], [place]. This information is not intended to replace advice given to you by your health care provider. Make sure you discuss any questions you have with your health care provider.

## 2014-07-13 ENCOUNTER — Ambulatory Visit: Payer: 59

## 2014-07-17 ENCOUNTER — Ambulatory Visit: Payer: 59

## 2014-07-18 ENCOUNTER — Ambulatory Visit (INDEPENDENT_AMBULATORY_CARE_PROVIDER_SITE_OTHER): Payer: 59

## 2014-07-18 VITALS — BP 129/69 | HR 67 | Resp 12

## 2014-07-18 DIAGNOSIS — B351 Tinea unguium: Secondary | ICD-10-CM

## 2014-07-18 DIAGNOSIS — E1140 Type 2 diabetes mellitus with diabetic neuropathy, unspecified: Secondary | ICD-10-CM

## 2014-07-18 DIAGNOSIS — E1142 Type 2 diabetes mellitus with diabetic polyneuropathy: Secondary | ICD-10-CM

## 2014-07-18 DIAGNOSIS — B070 Plantar wart: Secondary | ICD-10-CM

## 2014-07-18 DIAGNOSIS — M778 Other enthesopathies, not elsewhere classified: Secondary | ICD-10-CM

## 2014-07-18 DIAGNOSIS — M2010 Hallux valgus (acquired), unspecified foot: Secondary | ICD-10-CM

## 2014-07-18 DIAGNOSIS — M7750 Other enthesopathy of unspecified foot: Secondary | ICD-10-CM

## 2014-07-18 DIAGNOSIS — E1149 Type 2 diabetes mellitus with other diabetic neurological complication: Secondary | ICD-10-CM

## 2014-07-18 DIAGNOSIS — M779 Enthesopathy, unspecified: Secondary | ICD-10-CM

## 2014-07-18 NOTE — Progress Notes (Signed)
   Subjective:    Patient ID: [name], female    DOB: Jul 22, 1956, 58 y.o.   MRN: [id number]  HPI  ''B/L GREAT TOENAIL STILL HAVE DISCOLORATION AND STILL LOOK THE SAME. ALSO,DRY SKIN IS NOT GETTING CLEAR AT ALL.''  ''ALSO, BUNION STILL PAINFUL ESPECIALLY RT FOOT.''   Review of Systems no new findings or systemic changes noted     Objective:   Physical Exam Neurovascular status is intact and unchanged DP and PT +2/4 bilateral capillary refill time 3 seconds is residual HAV deformity right foot painful tender with certain shoes has multiple poor keratotic or verrucoid type lesions sub-5 right a medial heel bilateral plantaris dispense Aquasol instruction sheet for short wrap patient salicylic acid under occlusion with duct tape. Continues any skin irritation occurs. Patient is also advised to keep lotion or skin a lotion including the antifungal cream on these occlusion utilizing saran wrap or plastic wrap to help the cream or lotion penetrate into the skin and avoid from absorbing of her shoes and socks. No new infections no open wounds or ulcers no secondary changes does have diabetes with minimal peripheral neuropathy vascular status is intact       Assessment & Plan:  Assessment verruca plantaris we use topical treatments Aquasol structures given the with topical antifungal cream and topical nail antifungal daily as instructed advised to take simply 6 months to your resolve for show some improvement in the nails and skin. 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

## 2014-07-19 ENCOUNTER — Other Ambulatory Visit (INDEPENDENT_AMBULATORY_CARE_PROVIDER_SITE_OTHER): Payer: Self-pay | Admitting: General Surgery

## 2014-07-19 DIAGNOSIS — C50912 Malignant neoplasm of unspecified site of left female breast: Secondary | ICD-10-CM

## 2014-07-24 ENCOUNTER — Ambulatory Visit: Payer: 59 | Admitting: Physical Therapy

## 2014-08-02 ENCOUNTER — Ambulatory Visit: Payer: 59 | Attending: General Surgery | Admitting: Physical Therapy

## 2014-08-02 DIAGNOSIS — M79622 Pain in left upper arm: Secondary | ICD-10-CM | POA: Insufficient documentation

## 2014-08-02 DIAGNOSIS — M797 Fibromyalgia: Secondary | ICD-10-CM | POA: Diagnosis not present

## 2014-08-02 DIAGNOSIS — C50912 Malignant neoplasm of unspecified site of left female breast: Secondary | ICD-10-CM | POA: Insufficient documentation

## 2014-08-02 DIAGNOSIS — Z5189 Encounter for other specified aftercare: Secondary | ICD-10-CM | POA: Diagnosis not present

## 2014-08-02 DIAGNOSIS — Z923 Personal history of irradiation: Secondary | ICD-10-CM | POA: Diagnosis not present

## 2014-08-02 DIAGNOSIS — N644 Mastodynia: Secondary | ICD-10-CM | POA: Diagnosis not present

## 2014-08-03 ENCOUNTER — Other Ambulatory Visit: Payer: Self-pay

## 2014-08-07 ENCOUNTER — Ambulatory Visit: Payer: 59 | Admitting: Physical Therapy

## 2014-08-07 DIAGNOSIS — Z5189 Encounter for other specified aftercare: Secondary | ICD-10-CM | POA: Diagnosis not present

## 2014-08-09 ENCOUNTER — Ambulatory Visit: Payer: 59 | Admitting: Physical Therapy

## 2014-08-09 ENCOUNTER — Encounter: Payer: 59 | Admitting: Physical Therapy

## 2014-08-09 DIAGNOSIS — Z5189 Encounter for other specified aftercare: Secondary | ICD-10-CM | POA: Diagnosis not present

## 2014-08-14 ENCOUNTER — Ambulatory Visit: Payer: 59 | Admitting: Physical Therapy

## 2014-08-15 ENCOUNTER — Ambulatory Visit: Payer: 59

## 2014-08-15 DIAGNOSIS — Z5189 Encounter for other specified aftercare: Secondary | ICD-10-CM | POA: Diagnosis not present

## 2014-08-16 ENCOUNTER — Encounter: Payer: 59 | Admitting: Physical Therapy

## 2014-08-17 ENCOUNTER — Ambulatory Visit: Payer: 59 | Admitting: Physical Therapy

## 2014-08-17 DIAGNOSIS — Z5189 Encounter for other specified aftercare: Secondary | ICD-10-CM | POA: Diagnosis not present

## 2014-08-21 ENCOUNTER — Ambulatory Visit: Payer: 59 | Attending: General Surgery

## 2014-08-21 DIAGNOSIS — C50912 Malignant neoplasm of unspecified site of left female breast: Secondary | ICD-10-CM

## 2014-08-21 DIAGNOSIS — M542 Cervicalgia: Secondary | ICD-10-CM | POA: Diagnosis not present

## 2014-08-21 DIAGNOSIS — M25519 Pain in unspecified shoulder: Secondary | ICD-10-CM | POA: Insufficient documentation

## 2014-08-21 DIAGNOSIS — I890 Lymphedema, not elsewhere classified: Secondary | ICD-10-CM | POA: Diagnosis present

## 2014-08-21 NOTE — Therapy (Signed)
Physical Therapy Treatment  Patient Details  Name: [name] MRN: [id number] Date of Birth: 1956/02/04  Encounter Date: 08/21/2014      PT End of Session - 08/21/14 0905    Visit Number 6   Number of Visits 8   PT Start Time 0821   PT Stop Time 0903   PT Time Calculation (min) 42 min      Past Medical History  Diagnosis Date  . HYPERLIPIDEMIA 05/11/2008  . OBSTRUCTIVE SLEEP APNEA 05/11/2008  . RASH-NONVESICULAR 06/29/2008  . COLONIC POLYPS, HX OF 05/11/2008  . GENITAL HERPES, HX OF 08/08/2009  . Inverted nipple   . Menorrhagia   . 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 LLQ pain   . Hernia   . Low iron   . H/O gonorrhea   . Bacterial infection   . Trichomonas   . H/O irritable bowel syndrome   . Breast cancer     2012  . Hypertension   . Arthritis   . Abscess of buttock   . Diabetes mellitus without complication   . Allergy   . Dysrhythmia   . H/O hiatal hernia   . Anemia     Past Surgical History  Procedure Laterality Date  . Cesarean section    . Right achilles tendon      and left  . S/p ear surgury    . Right ovarian cyst      hx  . S/p extra uterine fibroid  2006  . S/p left knee replacement  2007  . Dilation and curettage of uterus    . Uterine fibroid surgery  2006  . Breast cyst excision  1973  . Breast lumpectomy    . Axillary lymph node dissection    . Colonoscopy    . Breast lumpectomy with needle localization Right 12/20/2013    Procedure: EXCISION RIGHT BREAST MASS WITH NEEDLE LOCALIZATION;  Surgeon: Stark Klein, MD;  Location: [facility];  Service: General;  Laterality: Right;    There were no vitals taken for this visit.  Visit Diagnosis:  Breast cancer, left      Subjective Assessment - 08/21/14 0909    Symptoms Patient reports has had decreased Lt shoulder pain since manual lymph drainage last week, and even has noticed being able to empty bladder more efficiently. "I've been doing the massage on myself at home some, need to  work on my pressure though."   Currently in Pain? No/denies     Manual lymph drainage in supine as follows: short neck, left inguinal nodes, superficial and deep abdominals; left axillo-inguinal anastamosis, Lt breast and  Left upper arm redirecting along pathway; reviewing with patient correct direction of sequence and pressure throughout.  Continued in supine for PROM of Lt shoulder into flexion, abduction, and D2 with UE myofascial pulling throughout, all to patients tolerance.           Education - 08/21/14 0911    Education provided Yes   Education Details Informed patient of Earlie Counts, PT specializing in incontinence issues if patient notices this continuing to be a problem for her as she reports has been for a few years now.    Education Details Patient   Methods Explanation   Comprehension Verbalized understanding              Plan - 08/21/14 0914    Clinical Impression Statement Patient having increased PROM with less pain at end ROM, still presents with c/o edema at trunk though reports  this is remarkedly improved.    Rehab Potential Good   PT Frequency Min 2X/week   PT Duration 4 weeks   PT Plan Continue with manual lymph drainage reviewing with patietn prn. Assess next visit.         Problem List Patient Active Problem List   Diagnosis Date Noted  . Abscess of right breast (postoperative) 02/06/2014  . Breast mass, right 09/25/2013  . Hot flashes due to tamoxifen 09/04/2013  . Pain, lower extremity 07/19/2013  . Pain in joint, lower leg 06/26/2013  . Abdominal tenderness 02/08/2013  . Type II or unspecified type diabetes mellitus without mention of complication, uncontrolled 11/09/2012  . Rash 11/09/2012  . Lateral ventral hernia 10/14/2012  . Hidradenitis 10/14/2012  . Pulmonary nodule seen on imaging study 10/14/2012  . Left knee pain 03/31/2012  . Left wrist pain 03/31/2012  . Anxiety and depression 03/31/2012  . Diarrhea 02/22/2012  . Left  hip pain 02/17/2012  . Morbid obesity 02/17/2012  . Fibroid 12/08/2011    Class: History of  . Pelvic pain 11/17/2011    Class: History of  . Back pain 11/17/2011    Class: History of  . Eczema 10/27/2011  . L breast cancer, pT1c, N18mic, Mx, s/p L BCT 02/10/2011, s/p XRT 06/12/2011  . Fibromyalgia 02/13/2011  . Preventative health care 02/08/2011  . Menorrhagia 12/25/2009    Class: History of  . GENITAL HERPES, HX OF 08/08/2009  . 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 HYPERLIPIDEMIA 05/11/2008  . ANEMIA-IRON DEFICIENCY 05/11/2008  . OBSTRUCTIVE SLEEP APNEA 05/11/2008  . GERD 05/11/2008  . COLONIC POLYPS, HX OF 05/11/2008                                        Short Term Clinic Goals - 08/21/14 [phone]    Title Short term goals = long term goals   Time 4   Period Weeks   Status On-going          Long Term Clinic Goals - 08/21/14 [phone]    Title Patient will be able to verbalize good understanding of the Maintenance Phase of treatment including manual lymph drainage, use of compression, and lymphhedema risk reduction practices.   Time 4   Period Weeks   Status On-going   Title Patient will be able to reduce edema by 50% per patient perception as Lt breast and axilla.   Time 4   Period Weeks   Status On-going   Title PAtient will be able to be independent with a home exercise program.    Time 4   Period Weeks   Status On-going   Title Patient will be able to report overall pain decreased >/=50% to tolerate daily tasks with less pain.    Time 4   Period Weeks   Status On-going         Collie Siad, [place] 08/21/2014 9:30 AM  Otelia Limes 08/21/2014, 9:30 AM

## 2014-08-23 ENCOUNTER — Ambulatory Visit: Payer: 59 | Admitting: Physical Therapy

## 2014-08-23 DIAGNOSIS — M25519 Pain in unspecified shoulder: Secondary | ICD-10-CM

## 2014-08-23 DIAGNOSIS — I890 Lymphedema, not elsewhere classified: Secondary | ICD-10-CM

## 2014-08-23 DIAGNOSIS — M542 Cervicalgia: Principal | ICD-10-CM

## 2014-08-23 DIAGNOSIS — C50912 Malignant neoplasm of unspecified site of left female breast: Secondary | ICD-10-CM

## 2014-08-23 NOTE — Therapy (Signed)
Physical Therapy Treatment  Patient Details  Name: [name] MRN: [id number] Date of Birth: October 09, 1956  Encounter Date: 08/23/2014      PT End of Session - 08/23/14 0907    Visit Number 7   Number of Visits 8   PT Start Time 0820  pt late for appointment   PT Stop Time 0904   PT Time Calculation (min) 44 min   Activity Tolerance Patient limited by pain  pt continued with pain in neck, feeling of fullness in  chest after treatment      Past Medical History  Diagnosis Date  . HYPERLIPIDEMIA 05/11/2008  . OBSTRUCTIVE SLEEP APNEA 05/11/2008  . RASH-NONVESICULAR 06/29/2008  . COLONIC POLYPS, HX OF 05/11/2008  . GENITAL HERPES, HX OF 08/08/2009  . Inverted nipple   . Menorrhagia   . 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 LLQ pain   . Hernia   . Low iron   . H/O gonorrhea   . Bacterial infection   . Trichomonas   . H/O irritable bowel syndrome   . Breast cancer     2012  . Hypertension   . Arthritis   . Abscess of buttock   . Diabetes mellitus without complication   . Allergy   . Dysrhythmia   . H/O hiatal hernia   . Anemia     Past Surgical History  Procedure Laterality Date  . Cesarean section    . Right achilles tendon      and left  . S/p ear surgury    . Right ovarian cyst      hx  . S/p extra uterine fibroid  2006  . S/p left knee replacement  2007  . Dilation and curettage of uterus    . Uterine fibroid surgery  2006  . Breast cyst excision  1973  . Breast lumpectomy    . Axillary lymph node dissection    . Colonoscopy    . Breast lumpectomy with needle localization Right 12/20/2013    Procedure: EXCISION RIGHT BREAST MASS WITH NEEDLE LOCALIZATION;  Surgeon: Stark Klein, MD;  Location: [facility];  Service: General;  Laterality: Right;    There were no vitals taken for this visit.  Visit Diagnosis:  Neck and shoulder pain  Breast cancer, left  Secondary lymphedema   Treatment: Manual cervical traction with myofascial release appreciated at  neck. Trigger points identified in scalene muscles. Manual lymph drainage in supine as follows: short neck, right axillary nodes, left inguinal nodes, superficial and deep abdominals; anterior inter-axillary anastamoses, left axillo-inguinal anastamoses. Left anterior chest, breast and abdomen.  Pt to sidelying for work on lateral chest and back, but this position exacerbated her neck pain .  Followed up with more manual cervical traction to attempt to relieve this pain.    Pt expresses frustration with not being able to lose weight and not remembering what she learned at her nurtrition class when she was first diagnosed with diabetes. Pt is interested in phone numbers for [place] Diabetes and Nurtrition management servicies or other nutritional counselling.     Problem List Patient Active Problem List   Diagnosis Date Noted  . Abscess of right breast (postoperative) 02/06/2014  . Breast mass, right 09/25/2013  . Hot flashes due to tamoxifen 09/04/2013  . Pain, lower extremity 07/19/2013  . Pain in joint, lower leg 06/26/2013  . Abdominal tenderness 02/08/2013  . Type II or unspecified type diabetes mellitus without mention of complication, uncontrolled 11/09/2012  . Rash 11/09/2012  .  Lateral ventral hernia 10/14/2012  . Hidradenitis 10/14/2012  . Pulmonary nodule seen on imaging study 10/14/2012  . Left knee pain 03/31/2012  . Left wrist pain 03/31/2012  . Anxiety and depression 03/31/2012  . Diarrhea 02/22/2012  . Left hip pain 02/17/2012  . Morbid obesity 02/17/2012  . Fibroid 12/08/2011    Class: History of  . Pelvic pain 11/17/2011    Class: History of  . Back pain 11/17/2011    Class: History of  . Eczema 10/27/2011  . L breast cancer, pT1c, N19mic, Mx, s/p L BCT 02/10/2011, s/p XRT 06/12/2011  . Fibromyalgia 02/13/2011  . Preventative health care 02/08/2011  . Menorrhagia 12/25/2009    Class: History of  . GENITAL HERPES, HX OF 08/08/2009  . Hypertonicity of bladder  06/29/2008  . HYPERLIPIDEMIA 05/11/2008  . ANEMIA-IRON DEFICIENCY 05/11/2008  . OBSTRUCTIVE SLEEP APNEA 05/11/2008  . GERD 05/11/2008  . COLONIC POLYPS, HX OF 05/11/2008   Donato Heinz. Owens Shark, PT   08/23/2014, 9:22 AM

## 2014-08-25 ENCOUNTER — Emergency Department (HOSPITAL_COMMUNITY): Payer: 59

## 2014-08-25 ENCOUNTER — Emergency Department (HOSPITAL_COMMUNITY)
Admission: EM | Admit: 2014-08-25 | Discharge: 2014-08-25 | Disposition: A | Discharge status: Home / Self Care | Payer: 59 | Attending: Emergency Medicine | Admitting: Emergency Medicine

## 2014-08-25 DIAGNOSIS — M1990 Unspecified osteoarthritis, unspecified site: Secondary | ICD-10-CM | POA: Diagnosis not present

## 2014-08-25 DIAGNOSIS — Z79899 Other long term (current) drug therapy: Secondary | ICD-10-CM | POA: Diagnosis not present

## 2014-08-25 DIAGNOSIS — Y92410 Unspecified street and highway as the place of occurrence of the external cause: Secondary | ICD-10-CM | POA: Insufficient documentation

## 2014-08-25 DIAGNOSIS — Y998 Other external cause status: Secondary | ICD-10-CM | POA: Insufficient documentation

## 2014-08-25 DIAGNOSIS — Z86010 Personal history of colonic polyps: Secondary | ICD-10-CM | POA: Diagnosis not present

## 2014-08-25 DIAGNOSIS — Z8619 Personal history of other infectious and parasitic diseases: Secondary | ICD-10-CM | POA: Insufficient documentation

## 2014-08-25 DIAGNOSIS — I10 Essential (primary) hypertension: Secondary | ICD-10-CM | POA: Diagnosis not present

## 2014-08-25 DIAGNOSIS — Y9389 Activity, other specified: Secondary | ICD-10-CM | POA: Diagnosis not present

## 2014-08-25 DIAGNOSIS — Z7951 Long term (current) use of inhaled steroids: Secondary | ICD-10-CM | POA: Diagnosis not present

## 2014-08-25 DIAGNOSIS — Z862 Personal history of diseases of the blood and blood-forming organs and certain disorders involving the immune mechanism: Secondary | ICD-10-CM | POA: Insufficient documentation

## 2014-08-25 DIAGNOSIS — Z853 Personal history of malignant neoplasm of breast: Secondary | ICD-10-CM | POA: Diagnosis not present

## 2014-08-25 DIAGNOSIS — Z8719 Personal history of other diseases of the digestive system: Secondary | ICD-10-CM | POA: Insufficient documentation

## 2014-08-25 DIAGNOSIS — S299XXA Unspecified injury of thorax, initial encounter: Secondary | ICD-10-CM | POA: Insufficient documentation

## 2014-08-25 DIAGNOSIS — Z8669 Personal history of other diseases of the nervous system and sense organs: Secondary | ICD-10-CM | POA: Diagnosis not present

## 2014-08-25 DIAGNOSIS — E119 Type 2 diabetes mellitus without complications: Secondary | ICD-10-CM | POA: Diagnosis not present

## 2014-08-25 DIAGNOSIS — Z872 Personal history of diseases of the skin and subcutaneous tissue: Secondary | ICD-10-CM | POA: Diagnosis not present

## 2014-08-25 DIAGNOSIS — R0781 Pleurodynia: Secondary | ICD-10-CM

## 2014-08-25 MED ORDER — NAPROXEN 500 MG PO TABS: 500.0000 mg | ORAL_TABLET | Freq: Two times a day (BID) | ORAL | Status: DC

## 2014-08-25 MED ORDER — METHOCARBAMOL 500 MG PO TABS: 500.0000 mg | ORAL_TABLET | Freq: Two times a day (BID) | ORAL | Status: DC
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
## 2014-08-27 ENCOUNTER — Ambulatory Visit: Payer: 59 | Admitting: Physical Therapy

## 2014-08-27 DIAGNOSIS — I890 Lymphedema, not elsewhere classified: Secondary | ICD-10-CM

## 2014-08-27 DIAGNOSIS — M25519 Pain in unspecified shoulder: Secondary | ICD-10-CM

## 2014-08-27 DIAGNOSIS — C50912 Malignant neoplasm of unspecified site of left female breast: Secondary | ICD-10-CM

## 2014-08-27 DIAGNOSIS — M542 Cervicalgia: Principal | ICD-10-CM
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
## 2014-08-29 ENCOUNTER — Ambulatory Visit: Payer: 59

## 2014-08-29 DIAGNOSIS — I890 Lymphedema, not elsewhere classified: Secondary | ICD-10-CM

## 2014-08-29 DIAGNOSIS — C50912 Malignant neoplasm of unspecified site of left female breast: Secondary | ICD-10-CM

## 2014-08-29 DIAGNOSIS — M542 Cervicalgia: Secondary | ICD-10-CM | POA: Diagnosis not present

## 2014-08-29 DIAGNOSIS — M25519 Pain in unspecified shoulder: Secondary | ICD-10-CM

## 2014-08-29 NOTE — Patient Instructions (Addendum)
PELVIC STABILIZATION: Pelvic Tilt (Lying) Flatten back by tightening stomach muscles and tilting hips toward waist. Hold this position while lifting one arm over head and opposite leg up. Repeat on other side. Hold ___3_ seconds each side. Repeat __10__ times. Do __2-3__ sessions per day.  Copyright  VHI. All rights reserved.  Pelvic Tilt Stabilization, Lower Extremity (Flexion)   Maintaining pelvic tilt, keep lower back flat and slowly slide leg up toward buttocks. Return. Repeat with other leg. Repeat _2 sets of 5___ times. Do _2-3___ sessions per day.    Copyright  VHI. All rights reserved.  Can call Earlie Counts, PT at [facility] [phone]

## 2014-08-29 NOTE — Therapy (Signed)
Physical Therapy Treatment  Patient Details  Name: [name] MRN: [id number] Date of Birth: 1955/10/29  Encounter Date: 08/29/2014      PT End of Session - 08/29/14 0927    Visit Number 9   Number of Visits 16   PT Start Time 0815   PT Stop Time 0906   PT Time Calculation (min) 51 min      Past Medical History  Diagnosis Date  . HYPERLIPIDEMIA 05/11/2008  . OBSTRUCTIVE SLEEP APNEA 05/11/2008  . RASH-NONVESICULAR 06/29/2008  . COLONIC POLYPS, HX OF 05/11/2008  . GENITAL HERPES, HX OF 08/08/2009  . Inverted nipple   . Menorrhagia   . 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 LLQ pain   . Hernia   . Low iron   . H/O gonorrhea   . Bacterial infection   . Trichomonas   . H/O irritable bowel syndrome   . Breast cancer     2012  . Hypertension   . Arthritis   . Abscess of buttock   . Diabetes mellitus without complication   . Allergy   . Dysrhythmia   . H/O hiatal hernia   . Anemia     Past Surgical History  Procedure Laterality Date  . Cesarean section    . Right achilles tendon      and left  . S/p ear surgury    . Right ovarian cyst      hx  . S/p extra uterine fibroid  2006  . S/p left knee replacement  2007  . Dilation and curettage of uterus    . Uterine fibroid surgery  2006  . Breast cyst excision  1973  . Breast lumpectomy    . Axillary lymph node dissection    . Colonoscopy    . Breast lumpectomy with needle localization Right 12/20/2013    Procedure: EXCISION RIGHT BREAST MASS WITH NEEDLE LOCALIZATION;  Surgeon: Stark Klein, MD;  Location: [facility];  Service: General;  Laterality: Right;    There were no vitals taken for this visit.  Visit Diagnosis:  Neck and shoulder pain  Breast cancer, left  Secondary lymphedema      Subjective Assessment - 08/29/14 0822    Symptoms Went to ED saturday for abdominal pain, xrays came back clean and they prescribed me pain pills but I havent started them yet bc Im already on one. No complaints of pain  other wise nd abdomen is starting to feel better.    Currently in Pain? No/denies   Effect of Pain on Daily Activities Normally has bil knee stiffness, however felt this was improved after exercising on NuStep today.            [facility] Adult PT Treatment/Exercise - 08/29/14 0001    Lumbar Exercises: Stretches   Active Hamstring Stretch 3 reps;10 seconds  In supine   Lower Trunk Rotation 5 reps;10 seconds   Lumbar Exercises: Supine   Other Supine Lumbar Exercises Pelvic tilts 10 reps, 5 sec: Tilt with: knees bent abd-/adduction 4 sets of 3 reps, alternate marching 3 sets of 5 reps, and alternate LE extension 2 sets of 3 reps (some pain with this, modified motion)   Knee/Hip Exercises: Aerobic   Stationary Bike NuStep   Resistance 3, x 12 minutes.    Shoulder Exercises: Pulleys   Flexion 2 minutes   ABduction 2 minutes          PT Education - 08/29/14 0925    Education Details Supine lumbar stabilizations (  pelvic tilts).   Person(s) Educated Patient   Methods Explanation;Demonstration;Verbal cues;Handout   Comprehension Verbalized understanding;Returned demonstration     Throughout treatment today also encouraged patient to look into a gym membership, possibly [employer], due to being a more affordable option than [employer] which is where she was able to do [employer] program for free for 12 weeks but was unable to continue due to their fees.         Plan - 08/29/14 0933    Clinical Impression Statement Patient able to tolerate increased activity today, and after treatment was able to move easier and with less c/o stiffness in bil knees. Issued phone number for Earlie Counts, PT to possibly address incontinence issues. Patient tolerated new home exercise program well.   Pt will benefit from skilled therapeutic intervention in order to improve on the following deficits Decreased range of motion;Decreased strength;Decreased activity tolerance   Rehab Potential Good   Clinical  Impairments Affecting Rehab Potential Bil knee pain limits patient from doing alot of weight bearing exercises, like walking.   PT Next Visit Plan Review home exercise program and continue per plan of care.        Problem List Patient Active Problem List   Diagnosis Date Noted  . Abscess of right breast (postoperative) 02/06/2014  . Breast mass, right 09/25/2013  . Hot flashes due to tamoxifen 09/04/2013  . Pain, lower extremity 07/19/2013  . Pain in joint, lower leg 06/26/2013  . Abdominal tenderness 02/08/2013  . Type II or unspecified type diabetes mellitus without mention of complication, uncontrolled 11/09/2012  . Rash 11/09/2012  . Lateral ventral hernia 10/14/2012  . Hidradenitis 10/14/2012  . Pulmonary nodule seen on imaging study 10/14/2012  . Left knee pain 03/31/2012  . Left wrist pain 03/31/2012  . Anxiety and depression 03/31/2012  . Diarrhea 02/22/2012  . Left hip pain 02/17/2012  . Morbid obesity 02/17/2012  . Fibroid 12/08/2011    Class: History of  . Pelvic pain 11/17/2011    Class: History of  . Back pain 11/17/2011    Class: History of  . Eczema 10/27/2011  . L breast cancer, pT1c, N70mic, Mx, s/p L BCT 02/10/2011, s/p XRT 06/12/2011  . Fibromyalgia 02/13/2011  . Preventative health care 02/08/2011  . Menorrhagia 12/25/2009    Class: History of  . GENITAL HERPES, HX OF 08/08/2009  . 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 HYPERLIPIDEMIA 05/11/2008  . ANEMIA-IRON DEFICIENCY 05/11/2008  . OBSTRUCTIVE SLEEP APNEA 05/11/2008  . GERD 05/11/2008  . COLONIC POLYPS, HX OF 05/11/2008                                             Collie Siad, PTA 08/29/2014 9:51 AM Otelia Limes 08/29/2014, 9:51 AM

## 2014-09-03 ENCOUNTER — Ambulatory Visit: Payer: 59 | Admitting: Physical Therapy

## 2014-09-05 ENCOUNTER — Ambulatory Visit: Payer: 59 | Admitting: Physical Therapy

## 2014-09-05 DIAGNOSIS — I890 Lymphedema, not elsewhere classified: Secondary | ICD-10-CM

## 2014-09-05 DIAGNOSIS — M542 Cervicalgia: Principal | ICD-10-CM

## 2014-09-05 DIAGNOSIS — C50912 Malignant neoplasm of unspecified site of left female breast: Secondary | ICD-10-CM

## 2014-09-05 DIAGNOSIS — M25519 Pain in unspecified shoulder: Secondary | ICD-10-CM

## 2014-09-05 NOTE — Therapy (Deleted)
Physical Therapy Treatment  Patient Details  Name: [name] MRN: [id number] Date of Birth: [birth year]  Encounter Date: 09/05/2014      PT End of Session - 09/05/14 1726    Visit Number 10   Number of Visits 16   PT Start Time 0807   PT Stop Time 2263   PT Time Calculation (min) 40 min   Activity Tolerance Patient tolerated treatment well      Past Medical History  Diagnosis Date  . HYPERLIPIDEMIA 05/11/2008  . OBSTRUCTIVE SLEEP APNEA 05/11/2008  . RASH-NONVESICULAR 06/29/2008  . COLONIC POLYPS, HX OF 05/11/2008  . GENITAL HERPES, HX OF 08/08/2009  . Inverted nipple   . Menorrhagia   . 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 LLQ pain   . Hernia   . Low iron   . H/O gonorrhea   . Bacterial infection   . Trichomonas   . H/O irritable bowel syndrome   . Breast cancer     2012  . Hypertension   . Arthritis   . Abscess of buttock   . Diabetes mellitus without complication   . Allergy   . Dysrhythmia   . H/O hiatal hernia   . Anemia     Past Surgical History  Procedure Laterality Date  . Cesarean section    . Right achilles tendon      and left  . S/p ear surgury    . Right ovarian cyst      hx  . S/p extra uterine fibroid  2006  . S/p left knee replacement  2007  . Dilation and curettage of uterus    . Uterine fibroid surgery  2006  . Breast cyst excision  1973  . Breast lumpectomy    . Axillary lymph node dissection    . Colonoscopy    . Breast lumpectomy with needle localization Right 12/20/2013    Procedure: EXCISION RIGHT BREAST MASS WITH NEEDLE LOCALIZATION;  Surgeon: Stark Klein, MD;  Location: [facility];  Service: General;  Laterality: Right;    There were no vitals taken for this visit.  Visit Diagnosis:  Neck and shoulder pain  Breast cancer, left  Secondary lymphedema        [facility] PT Assessment - 09/05/14 1730    AROM   Left Shoulder Flexion 166 Degrees   Left Shoulder ABduction 170 Degrees           PT Education - 09/05/14  0848    Education provided Yes   Education Details neck range motion and stretch, weight shift for quad activatoin   Person(s) Educated Patient   Methods Explanation;Demonstration;Verbal cues;Handout   Comprehension Verbalized understanding;Returned demonstration;Need further instruction          Plan - 09/05/14 1726    Clinical Impression Statement Pt requesting this to be her last treatment as she is not able to attend next week due to schedule conflict and she feels that she is doing much better   PT Plan discharge this episodeof care.          Problem List Patient Active Problem List   Diagnosis Date Noted  . Abscess of right breast (postoperative) 02/06/2014  . Breast mass, right 09/25/2013  . Hot flashes due to tamoxifen 09/04/2013  . Pain, lower extremity 07/19/2013  . Pain in joint, lower leg 06/26/2013  . Abdominal tenderness 02/08/2013  . Type II or unspecified type diabetes mellitus without mention of complication, uncontrolled 11/09/2012  . Rash 11/09/2012  . Lateral  ventral hernia 10/14/2012  . Hidradenitis 10/14/2012  . Pulmonary nodule seen on imaging study 10/14/2012  . Left knee pain 03/31/2012  . Left wrist pain 03/31/2012  . Anxiety and depression 03/31/2012  . Diarrhea 02/22/2012  . Left hip pain 02/17/2012  . Morbid obesity 02/17/2012  . Fibroid 12/08/2011    Class: History of  . Pelvic pain 11/17/2011    Class: History of  . Back pain 11/17/2011    Class: History of  . Eczema 10/27/2011  . L breast cancer, pT1c, N63mc, Mx, s/p L BCT 02/10/2011, s/p XRT 06/12/2011  . Fibromyalgia 02/13/2011  . Preventative health care 02/08/2011  . Menorrhagia 12/25/2009    Class: History of  . GENITAL HERPES, HX OF 08/08/2009  . 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 HYPERLIPIDEMIA 05/11/2008  . ANEMIA-IRON DEFICIENCY 05/11/2008  . OBSTRUCTIVE SLEEP APNEA 05/11/2008  . GERD 05/11/2008  . COLONIC POLYPS, HX OF 05/11/2008            Short Term  Clinic Goals - 09/05/14 1724    CC Short Term Goal  #1   Title Short term goals = long term goals   Time 4   Period Weeks   Status Achieved          Long Term Clinic Goals - 09/05/14 [phone]   CC Long Term Goal  #1   Title Patient will be able to verbalize good understanding of the Maintenance Phase of treatment including manual lymph drainage, use of compression, and lymphhedema risk reduction practices.   Time 4   Period Weeks   Status Achieved   CC Long Term Goal  #2   Title Patient will be able to reduce edema by 50% per patient perception as Lt breast and axilla.   Time 4   Period Weeks   Status Achieved  patient reports her pain is better, doesn't notice the swelling   CC Long Term Goal  #3   Title PAtient will be able to be independent with a home exercise program.    Time 4   Period Weeks   Status Achieved   CC Long Term Goal  #4   Title Patient will be able to report overall pain decreased >/=50% to tolerate daily tasks with less pain.    Time 4   Period --   Status Achieved       PHYSICAL THERAPY DISCHARGE SUMMARY  Visits from Start of Care: 10  Current functional level related to goals / functional outcomes: Ms. TPettibonefeels that her pain is improved and that she is able to move more easily   Remaining deficits: Continues to have multiple areas of intermittent pain   Education / Equipment: Home exercise Plan: Patient agrees to discharge.  Patient goals were partially met. Patient is being discharged due to meeting the stated rehab goals.  ?????        TDonato Heinz BOwens Shark PT  09/05/2014, 5:44 PM

## 2014-09-05 NOTE — Therapy (Signed)
Physical Therapy Treatment  Patient Details  Name: [name] MRN: [id number] Date of Birth: Nov 15, 1955  Encounter Date: 09/05/2014      PT End of Session - 09/05/14 1726    Visit Number 10   Number of Visits 16   PT Start Time 0807   PT Stop Time 9924   PT Time Calculation (min) 40 min   Activity Tolerance Patient tolerated treatment well      Past Medical History  Diagnosis Date  . HYPERLIPIDEMIA 05/11/2008  . OBSTRUCTIVE SLEEP APNEA 05/11/2008  . RASH-NONVESICULAR 06/29/2008  . COLONIC POLYPS, HX OF 05/11/2008  . GENITAL HERPES, HX OF 08/08/2009  . Inverted nipple   . Menorrhagia   . 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 LLQ pain   . Hernia   . Low iron   . H/O gonorrhea   . Bacterial infection   . Trichomonas   . H/O irritable bowel syndrome   . Breast cancer     2012  . Hypertension   . Arthritis   . Abscess of buttock   . Diabetes mellitus without complication   . Allergy   . Dysrhythmia   . H/O hiatal hernia   . Anemia     Past Surgical History  Procedure Laterality Date  . Cesarean section    . Right achilles tendon      and left  . S/p ear surgury    . Right ovarian cyst      hx  . S/p extra uterine fibroid  2006  . S/p left knee replacement  2007  . Dilation and curettage of uterus    . Uterine fibroid surgery  2006  . Breast cyst excision  1973  . Breast lumpectomy    . Axillary lymph node dissection    . Colonoscopy    . Breast lumpectomy with needle localization Right 12/20/2013    Procedure: EXCISION RIGHT BREAST MASS WITH NEEDLE LOCALIZATION;  Surgeon: Jacqueline Klein, MD;  Location: [facility];  Service: General;  Laterality: Right;    There were no vitals taken for this visit.  Visit Diagnosis:  Neck and shoulder pain  Breast cancer, left  Secondary lymphedema        [facility] PT Assessment - 09/05/14 1730    AROM   Left Shoulder Flexion 166 Degrees   Left Shoulder ABduction 170 Degrees          [facility] Adult PT  Treatment/Exercise - 09/05/14 1732    Posture/Postural Control   Posture Comments reviewed taking breaks for posture stretches during workday   Neck Exercises: Stretches   Levator Stretch 3 reps  head toward left side to stretch right neck   Lower Cervical/Upper Thoracic Stretch Limitations hands beside head elbows forwrard, reach heart to ceiling   Other Neck Stretches UE neural flossing and neural glides in sitting   Neck Exercises: Seated   Cervical Rotation 5 reps;Both   Lateral Flexion 5 reps   W Back 5 reps   Shoulder Rolls Backwards;5 reps   Neck Exercises: Supine   Other Supine Exercise scapular retraction and downward rotation  10 reps   Knee/Hip Exercises: Standing   Other Standing Knee Exercises forward and back weight shift with dorsifelsion on front leg and plantarflexion with toe on floor on back leg. 10 reps with each leg forward          PT Education - 09/05/14 0848    Education provided Yes   Education Details neck range motion and  stretch, weight shift for quad activatoin   Person(s) Educated Patient   Methods Explanation;Demonstration;Verbal cues;Handout   Comprehension Verbalized understanding;Returned demonstration;Need further instruction              Plan - 09/05/14 1726    Clinical Impression Statement Pt requesting this to be her last treatment as she is not able to attend next week due to schedule conflict and she feels that she is doing much better   PT Plan discharge this episodeof care.          Problem List Patient Active Problem List   Diagnosis Date Noted  . Abscess of right breast (postoperative) 02/06/2014  . Breast mass, right 09/25/2013  . Hot flashes due to tamoxifen 09/04/2013  . Pain, lower extremity 07/19/2013  . Pain in joint, lower leg 06/26/2013  . Abdominal tenderness 02/08/2013  . Type II or unspecified type diabetes mellitus without mention of complication, uncontrolled 11/09/2012  . Rash 11/09/2012  . Lateral  ventral hernia 10/14/2012  . Hidradenitis 10/14/2012  . Pulmonary nodule seen on imaging study 10/14/2012  . Left knee pain 03/31/2012  . Left wrist pain 03/31/2012  . Anxiety and depression 03/31/2012  . Diarrhea 02/22/2012  . Left hip pain 02/17/2012  . Morbid obesity 02/17/2012  . Fibroid 12/08/2011    Class: History of  . Pelvic pain 11/17/2011    Class: History of  . Back pain 11/17/2011    Class: History of  . Eczema 10/27/2011  . L breast cancer, pT1c, N59mc, Mx, s/p L BCT 02/10/2011, s/p XRT 06/12/2011  . Fibromyalgia 02/13/2011  . Preventative health care 02/08/2011  . Menorrhagia 12/25/2009    Class: History of  . GENITAL HERPES, HX OF 08/08/2009  . 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 HYPERLIPIDEMIA 05/11/2008  . ANEMIA-IRON DEFICIENCY 05/11/2008  . OBSTRUCTIVE SLEEP APNEA 05/11/2008  . GERD 05/11/2008  . COLONIC POLYPS, HX OF 05/11/2008          Short Term Clinic Goals - 09/05/14 1724    CC Short Term Goal  #1   Title Short term goals = long term goals   Time 4   Period Weeks   Status Achieved          Long Term Clinic Goals - 09/05/14 [phone]   CC Long Term Goal  #1   Title Patient will be able to verbalize good understanding of the Maintenance Phase of treatment including manual lymph drainage, use of compression, and lymphhedema risk reduction practices.   Time 4   Period Weeks   Status Achieved   CC Long Term Goal  #2   Title Patient will be able to reduce edema by 50% per patient perception as Lt breast and axilla.   Time 4   Period Weeks   Status Achieved  patient reports her pain is better, doesn't notice the swelling   CC Long Term Goal  #3   Title PAtient will be able to be independent with a home exercise program.    Time 4   Period Weeks   Status Achieved   CC Long Term Goal  #4   Title Patient will be able to report overall pain decreased >/=50% to tolerate daily tasks with less pain.    Time 4   Period --   Status Achieved        PHYSICAL THERAPY DISCHARGE SUMMARY  Visits from Start of Care: 10  Current functional level related to goals / functional outcomes: Ms. TJustissfeels  that she has less pain and better mobility   Remaining deficits: Multiple areas of intermittent pain   Education / Equipment: Home exercise program Plan: Patient agrees to discharge.  Patient goals were met. Patient is being discharged due to financial reasons.  ?????        Donato Heinz. Owens Shark, PT  09/05/2014, 6:17 PM

## 2014-09-05 NOTE — Patient Instructions (Signed)
AROM, Rotation   Sit or stand, head comfortable, centered position. Turn head slowly to look over one shoulder. Hold ___ seconds. Repeat to other side. Repeat 3___ times per session. Do _2__ sessions per day.  Copyright  VHI. All rights reserved.  NECK TENSION: Extension Stretch   Bring both hands to shoulders by sides of neck, fingers facing back. Inhaling, pull shoulders forward, and look up. Exhaling, bring head back to center. Lift heart to the ceiling Repeat _3__ times. Do __2_ times per day.    Copyright  VHI. All rights reserved.  Scalene Stretch   With right hand tucked under hip on side to be stretched, use opposite hand to grasp head and gently tilt it away from that side. Hold _5___ seconds. Repeat on other side. Repeat __3__ times. Do ___2_ sessions per day.  [email or web address].exer.[email or web address]   Copyright  VHI. All rights reserved.  PRE GAIT: One Step With Weight Shift   Stand with upright posture. Step forward with left leg. Shift weight forward onto leg, straighten hip and knee. Return to starting position. _10__ reps per set, _2__ sets per day, _7__ days per week Hold onto a support. Place towel or slider under foot. Repeat with other leg.  Copyright  VHI. All rights reserved.  AROM, Rotation   Sit or stand, head comfortable, centered position. Turn head slowly to look over one shoulder. Hold ___ seconds. Repeat to other side. Repeat __3_ times per session. Do _2__ sessions per day.  Copyright  VHI. All rights reserved.

## 2014-09-10 ENCOUNTER — Ambulatory Visit: Payer: 59

## 2014-09-12 ENCOUNTER — Ambulatory Visit: Payer: 59

## 2014-09-17 ENCOUNTER — Other Ambulatory Visit: Payer: 59

## 2014-09-17 ENCOUNTER — Telehealth: Payer: Self-pay | Admitting: Oncology

## 2014-09-17 NOTE — Telephone Encounter (Signed)
returned pt call and r/s appt per pt request...pt ok adna ware °

## 2014-09-18 ENCOUNTER — Other Ambulatory Visit (HOSPITAL_BASED_OUTPATIENT_CLINIC_OR_DEPARTMENT_OTHER): Payer: 59

## 2014-09-18 DIAGNOSIS — C50912 Malignant neoplasm of unspecified site of left female breast: Secondary | ICD-10-CM

## 2014-09-18 LAB — CBC WITH DIFFERENTIAL/PLATELET
BASO%: 0.2 % (ref 0.0–2.0)
Basophils Absolute: 0 10*3/uL (ref 0.0–0.1)
EOS%: 1.4 % (ref 0.0–7.0)
Eosinophils Absolute: 0.1 10*3/uL (ref 0.0–0.5)
HCT: 35.4 % (ref 34.8–46.6)
HGB: 11 g/dL — ABNORMAL LOW (ref 11.6–15.9)
LYMPH%: 26.1 % (ref 14.0–49.7)
MCH: 27.3 pg (ref 25.1–34.0)
MCHC: 31.1 g/dL — ABNORMAL LOW (ref 31.5–36.0)
MCV: 87.8 fL (ref 79.5–101.0)
MONO#: 0.5 10*3/uL (ref 0.1–0.9)
MONO%: 8.8 % (ref 0.0–14.0)
NEUT#: 3.6 10*3/uL (ref 1.5–6.5)
NEUT%: 63.5 % (ref 38.4–76.8)
Platelets: 266 10*3/uL (ref 145–400)
RBC: 4.03 10*6/uL (ref 3.70–5.45)
RDW: 15 % — ABNORMAL HIGH (ref 11.2–14.5)
WBC: 5.6 10*3/uL (ref 3.9–10.3)
lymph#: 1.5 10*3/uL (ref 0.9–3.3)

## 2014-09-18 LAB — FOLLICLE STIMULATING HORMONE: FSH: 21.4 m[IU]/mL

## 2014-09-18 LAB — COMPREHENSIVE METABOLIC PANEL (CC13)
ALT: 20 U/L (ref 0–55)
AST: 18 U/L (ref 5–34)
Albumin: 3.3 g/dL — ABNORMAL LOW (ref 3.5–5.0)
Alkaline Phosphatase: 44 U/L (ref 40–150)
Anion Gap: 8 mEq/L (ref 3–11)
BUN: 17.2 mg/dL (ref 7.0–26.0)
CO2: 25 mEq/L (ref 22–29)
Calcium: 9.3 mg/dL (ref 8.4–10.4)
Chloride: 108 mEq/L (ref 98–109)
Creatinine: 0.7 mg/dL (ref 0.6–1.1)
Glucose: 112 mg/dl (ref 70–140)
Potassium: 4 mEq/L (ref 3.5–5.1)
Sodium: 142 mEq/L (ref 136–145)
Total Bilirubin: 0.24 mg/dL (ref 0.20–1.20)
Total Protein: 7.5 g/dL (ref 6.4–8.3)

## 2014-09-18 LAB — LUTEINIZING HORMONE: LH: 17.5 m[IU]/mL

## 2014-09-21 LAB — ESTRADIOL, ULTRA SENS: Estradiol, Ultra Sensitive: 17 pg/mL

## 2014-10-01 ENCOUNTER — Ambulatory Visit (HOSPITAL_BASED_OUTPATIENT_CLINIC_OR_DEPARTMENT_OTHER): Payer: 59 | Admitting: Oncology

## 2014-10-01 ENCOUNTER — Telehealth: Payer: Self-pay | Admitting: Oncology

## 2014-10-01 VITALS — BP 150/71 | HR 68 | Temp 98.3°F | Resp 18 | Ht 61.0 in | Wt 246.7 lb

## 2014-10-01 DIAGNOSIS — C50912 Malignant neoplasm of unspecified site of left female breast: Secondary | ICD-10-CM

## 2014-10-01 DIAGNOSIS — M1610 Unilateral primary osteoarthritis, unspecified hip: Secondary | ICD-10-CM | POA: Insufficient documentation

## 2014-10-01 DIAGNOSIS — Z6841 Body Mass Index (BMI) 40.0 and over, adult: Secondary | ICD-10-CM

## 2014-10-01 DIAGNOSIS — Z853 Personal history of malignant neoplasm of breast: Secondary | ICD-10-CM

## 2014-10-01 MED ORDER — TAMOXIFEN CITRATE 20 MG PO TABS: 20.0000 mg | ORAL_TABLET | Freq: Every day | ORAL | Status: DC

## 2014-10-01 NOTE — Addendum Note (Signed)
Addended by: Laureen Abrahams on: 10/01/2014 06:16 PM   Modules accepted: Medications

## 2014-10-01 NOTE — Progress Notes (Signed)
ID: [name]   DOB: 05-23-1956  MR#: [id number]  NWG#:[id number]   PCP: Cathlean Cower, MD GYN:  Kendall Flack, MD SU:  Excell Seltzer, MD Other:  Thea Silversmith, MD;  Jari Pigg, MD;  Renato Shin, MD;  Lowella Bandy, MD;  Erskine Emery, MD  CHIEF COMPLAINT:  Left Breast Cancer TREATMENT: receiving active anti-estrogen therapy  HISTORY OF PRESENT ILLNESS: From the original intake note:  The patient had screening mammography in January 16, 2011 (she missed her mammogram on January 05, 2010) and this showed some changes in the left breast, which measured 1.1 cm.  Left breast ultrasound was obtained the same day and confirmed a small hypoechoic lobular area of nodularity measuring again 1.1 cm.  There was no other abnormality and the patient was set up for ultrasound-guided needle biopsy January 22, 2011.  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  Tumor was estrogen receptor positive at 82%, progesterone receptor positive at 92% with MIB-1 of 17% and no evidence of HER2 amplification, the ratio by CISH being 0.97.  With this information the patient was set up for bilateral breast MRI s and these were obtained January 29, 2011.  In the central left breast in the retroareolar location there was a spiculated enhancing mass with a regular margins measuring again 1.1 cm.  There were no other areas of abnormality.  She had her left lumpectomy and sentinel lymph node sampling on February 10, 2011.  The pathology report ([id number]) shows an 8-mm invasive ductal carcinoma, grade 1, involving 1/2 sentinel lymph nodes with a micrometastatic deposit.  The inferior margin was apparently broadly positive initially, but may have been cleared with subsequent excision in the same procedure.  The pathology report does not commit, but says the margin has been "likely cleared."   Her Oncotype DX gave her a recurrence score of 21%, predicting a  risk of recurrent for node positive patients in the 12% range.  She decided to forego chemotherapy, but proceded with radiation therapy. She completed radiation in August 2012, and started on tamoxifen the first week of September 2012.   Her subsequent history is as detailed below.  INTERVAL HISTORY: [name] returns today for followup of her left breast cancer. After her last visit here, in June 2015, she tells me she stopped the tamoxifen. She felt it was causing her some aches and pains. The aches and pains have not really changed all that much. She has not had a period in the last 6 months despite going off the tamoxifen. At the same time she is not having any vaginal dryness and her hot flashes have resolved. In short it is not clear that she has entered menopause fully  REVIEW OF SYSTEMS: [name] has significant pain in her left knee and left hip. She thinks she will need surgery to the left knee. She denies unusual headaches, visual changes, nausea, vomiting, or dizziness. He has been no cough, phlegm production, or pleurisy. Has been no change in bowel or bladder habits. She has been having some soreness in the left breast area. This is not progressive, more intense or frequent than before. It is not associated with swelling. A detailed review of systems is otherwise stable.  PAST MEDICAL HISTORY: Past Medical History  Diagnosis Date  . HYPERLIPIDEMIA 05/11/2008  . OBSTRUCTIVE SLEEP APNEA 05/11/2008  . RASH-NONVESICULAR 06/29/2008  . COLONIC POLYPS, HX OF 05/11/2008  . GENITAL HERPES, HX OF 08/08/2009  .  Inverted nipple   . Menorrhagia   . 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 LLQ pain   . Hernia   . Low iron   . H/O gonorrhea   . Bacterial infection   . Trichomonas   . H/O irritable bowel syndrome   . Breast cancer     2012  . Hypertension   . Arthritis   . Abscess of buttock   . Diabetes mellitus without complication   . Allergy   . Dysrhythmia   . H/O hiatal hernia   . Anemia    Is significant for hypertension, obesity, early glaucoma, removal of a right breast cysts in 1972, total left knee replacement in 2007, possible irritable bowel syndrome, history of fibroid uterus surgery in 2006, history of C-section in 1981 and history of remote repair of the right and left Achilles tendons.  PAST SURGICAL HISTORY: Past Surgical History  Procedure Laterality Date  . Cesarean section    . Right achilles tendon      and left  . S/p ear surgury    . Right ovarian cyst      hx  . S/p extra uterine fibroid  2006  . S/p left knee replacement  2007  . Dilation and curettage of uterus    . Uterine fibroid surgery  2006  . Breast cyst excision  1973  . Breast lumpectomy    . Axillary lymph node dissection    . Colonoscopy    . Breast lumpectomy with needle localization Right 12/20/2013    Procedure: EXCISION RIGHT BREAST MASS WITH NEEDLE LOCALIZATION;  Surgeon: Stark Klein, MD;  Location: [place];  Service: General;  Laterality: Right;    FAMILY HISTORY Family History  Problem Relation Age of Onset  . Diabetes Mother   . Hypertension Mother   . Stroke Mother   . Heart disease Father     COPD  . Alcohol abuse Father     ETOH dependence  . Colon cancer Neg Hx   . Cancer Maternal Aunt     breast  The patient's mother is alive..  The patient's father died in his early 29s from congestive heart failure.  The patient had five brothers and three sisters; most of theses siblings are half siblings, but in any case there is no history of breast or ovarian cancer in the immediate family.  There was one maternal aunt (out of a total of four) diagnosed with breast cancer in her 13s.  GYNECOLOGIC HISTORY: The patient had menarche age 60, she is still having regular periods.  She is Gx, P1, first pregnancy to term at age 85.  She stopped having menstrual periods August of 2012, but had one anomalous. In April of 2013.  SOCIAL HISTORY: [name] works as a Education officer, museum for [employer].  She has been divorced more than 10 years and lives by herself at home with no pets.  Her one child, a son, died at age 2.   ADVANCED DIRECTIVES: not in place  HEALTH MAINTENANCE: (Updated 09/04/2013) History  Substance Use Topics  . Smoking status: Never Smoker   . Smokeless tobacco: Never Used  . Alcohol Use: Yes     Comment: occasionally drinks wine     Colonoscopy: May 2013, Dr. Deatra Ina  PAP: UTD/Dr. Leo Grosser  Bone density: Not on file  Lipid panel: April 2014, Dr. Jenny Reichmann   Allergies  Allergen Reactions  . Codeine Nausea Only  . Cymbalta [Duloxetine Hcl] Other (See Comments)  Head felt funny, ? thinking not right  . Darvon Nausea Only  . Propoxyphene Hcl Nausea Only    Current Outpatient Prescriptions  Medication Sig Dispense Refill  . calcium carbonate (OS-CAL - DOSED IN MG OF ELEMENTAL CALCIUM) 1250 MG tablet Take 1 tablet by mouth.    . clotrimazole-betamethasone (LOTRISONE) cream Apply topically 2 (two) times daily. 45 g 1  . Efinaconazole (JUBLIA) 10 % SOLN Apply one drop to each affected toenail was daily for 12 months 8 mL 5  . Glucosamine-Vitamin D (GLUCOSAMINE PLUS VITAMIN D PO) Take 1 tablet by mouth 2 (two) times daily.    [name] ibuprofen (ADVIL,MOTRIN) 200 MG tablet Take 400 mg by mouth daily as needed (pain).    [name] liver oil-zinc oxide (DESITIN) 40 % ointment Apply 1 application topically as needed for irritation.    . Luliconazole (LUZU) 1 % CREA Applied twice daily to the affected area of both feet for 2-4 weeks 60 g 2  . metFORMIN (GLUCOPHAGE-XR) 500 MG 24 hr tablet TAKE 1 TABLET BY MOUTH EVERY DAY WITH BREAKFAST 90 tablet 3  . methocarbamol (ROBAXIN) 500 MG tablet Take 1 tablet (500 mg total) by mouth 2 (two) times daily. 20 tablet 0  . naproxen (NAPROSYN) 500 MG tablet Take 1 tablet (500 mg total) by mouth 2 (two) times daily. 30 tablet 0  . Naproxen Sodium (ALEVE PO) Take by mouth.    . tamoxifen (NOLVADEX) 20 MG tablet Take 1 tablet (20 mg total) by  mouth daily. 90 tablet 3  . telmisartan-hydrochlorothiazide (MICARDIS HCT) 80-12.5 MG per tablet Take 1 tablet by mouth daily. 90 tablet 3  . valACYclovir (VALTREX) 1000 MG tablet Take 1,000 mg by mouth daily as needed (herpes labialis).     . vitamin C (ASCORBIC ACID) 500 MG tablet Take 500 mg by mouth daily.    . [DISCONTINUED] tolterodine (DETROL LA) 4 MG 24 hr capsule Take 4 mg by mouth daily.     No current facility-administered medications for this visit.    OBJECTIVE: Middle-aged [place] American woman in no acute distress Filed Vitals:   10/01/14 0918  BP: 150/71  Pulse: 68  Temp: 98.3 F (36.8 C)  Resp: 18     Body mass index is 46.64 kg/(m^2).    ECOG FS: 1 Filed Weights   10/01/14 0918  Weight: 246 lb 11.2 oz (111.902 kg)   Sclerae unicteric, EOMs intact Oropharynx clear, teeth in good repair No cervical or supraclavicular adenopathy Lungs no rales or rhonchi Heart regular rate and rhythm Abd soft, obese, nontender, positive bowel sounds MSK no focal spinal tenderness, no upper extremity lymphedema Neuro: nonfocal, well oriented, appropriate affect Breasts: The right breast is unremarkable. The left breast is status post lumpectomy and radiation. There is no evidence of local recurrence. The left axilla is benign.  LAB RESULTS: Lab Results  Component Value Date   WBC 5.6 09/18/2014   NEUTROABS 3.6 09/18/2014   HGB 11.0* 09/18/2014   HCT 35.4 09/18/2014   MCV 87.8 09/18/2014   PLT 266 09/18/2014      Chemistry      Component Value Date/Time   NA 142 09/18/2014 0854   NA 141 12/14/2013 0912   K 4.0 09/18/2014 0854   K 3.7 12/14/2013 0912   CL 104 12/14/2013 0912   CL 106 01/18/2013 0909   CO2 25 09/18/2014 0854   CO2 26 12/14/2013 0912   BUN 17.2 09/18/2014 0854   BUN 15 12/14/2013 0912  CREATININE 0.7 09/18/2014 0854   CREATININE 0.66 12/14/2013 0912      Component Value Date/Time   CALCIUM 9.3 09/18/2014 0854   CALCIUM 9.7 12/14/2013 0912    ALKPHOS 44 09/18/2014 0854   ALKPHOS 36* 11/28/2013 1217   AST 18 09/18/2014 0854   AST 16 11/28/2013 1217   ALT 20 09/18/2014 0854   ALT 15 11/28/2013 1217   BILITOT 0.24 09/18/2014 0854   BILITOT 0.6 11/28/2013 1217     Results for KADESIA, ROBEL (MRN [id number]) as of 10/01/2014 09:23  Ref. Range 12/28/2011 14:30 06/24/2012 08:47 01/18/2013 08:28 04/26/2013 08:33 09/18/2014 08:55  Estradiol, Ultra Sensitive No range found 41 15 15 22 17   Results for MECKENZIE, BALSLEY (MRN [id number]) as of 10/01/2014 09:23  Ref. Range 12/28/2011 14:30 06/24/2012 08:47 01/18/2013 08:28 04/26/2013 08:33 09/18/2014 08:54  FSH No range found 10.2 10.8 10.2 8.7 21.4     STUDIES:   Mammography at [facility] 01/12/2014 was negative. ASSESSMENT: 58 y.o.  [place] woman   (1)  status post left lumpectomy and sentinel lymph node dissection April of 2012 for a T1b N1(mic) stage IB invasive ductal carcinoma, grade 1, estrogen receptor 82% and progesterone receptor 92% positive, with no HER-2 amplification, and an MIB-1-1 of 17%,   (2)  The patient's Oncotype DX score of 21 predicts a 13% risk of distant recurrence after 5 years of tamoxifen.  (3)  status post radiation completed August of 2012,   (4)  on tamoxifen from September of 2012 to April 2014  (5) the plan had been to initiate anastrozole in April 2014, but the patient had a menstrual cycle in May 2014, and resumed tamoxifen.  (a) discontinued tamoxifen on her own initiative June 2015 because of "aches and pains".  (b) resuming tamoxifen December 2015  (6) obesity: s/p [employer] program; considering bariattric surgery   PLAN: Weslee has not had a period in 6 months, but her LH FSH and estradiol really are not conclusively postmenopausal. It takes at least 4 months for tamoxifen levels to drop to minimal. At any rate I don't feel comfortable switching her to an aromatase inhibitor at this point.  I reassured her that tamoxifen generally does not  cause aches and pains. She is having knee pain because of her weight and I don't think any orthopedist will fix the left knee unless until she loses weight. Accordingly we discussed diet and exercise programs. She is considering bariatric surgery and that might be a good option for her.  We then went over her Oncotype results, which she was able to ". If she could switch over to an aromatase inhibitor or if she took tamoxifen for 10 years her risk of recurrence would not be 13% but 10%.  Accordingly the plan at this point is to go back to tamoxifen. We will continue on tamoxifen until she is clearly postmenopausal and at that time we will consider switching to an aromatase inhibitor. She will see [place] again in may of 2015 after her next mammogram. She will then see [place] again in November.  Shereta has a good understanding of the overall plan. She agrees with it. She knows the goal of treatment in her case is cure. She will call with any problems that may develop before her next visit here.   Chauncey Cruel, MD     10/01/2014

## 2014-10-01 NOTE — Telephone Encounter (Signed)
, °

## 2014-10-09 ENCOUNTER — Encounter: Payer: Self-pay | Admitting: Internal Medicine

## 2014-10-09 ENCOUNTER — Other Ambulatory Visit (INDEPENDENT_AMBULATORY_CARE_PROVIDER_SITE_OTHER): Payer: 59

## 2014-10-09 ENCOUNTER — Ambulatory Visit (INDEPENDENT_AMBULATORY_CARE_PROVIDER_SITE_OTHER): Payer: 59 | Admitting: Internal Medicine

## 2014-10-09 VITALS — BP 122/88 | HR 82 | Temp 98.6°F | Ht <= 58 in | Wt 246.5 lb

## 2014-10-09 DIAGNOSIS — E119 Type 2 diabetes mellitus without complications: Secondary | ICD-10-CM

## 2014-10-09 DIAGNOSIS — Z0189 Encounter for other specified special examinations: Secondary | ICD-10-CM

## 2014-10-09 DIAGNOSIS — J069 Acute upper respiratory infection, unspecified: Secondary | ICD-10-CM | POA: Insufficient documentation

## 2014-10-09 DIAGNOSIS — Z0000 Encounter for general adult medical examination without abnormal findings: Secondary | ICD-10-CM

## 2014-10-09 LAB — HEPATIC FUNCTION PANEL
ALT: 26 U/L (ref 0–35)
AST: 26 U/L (ref 0–37)
Albumin: 3.6 g/dL (ref 3.5–5.2)
Alkaline Phosphatase: 44 U/L (ref 39–117)
Bilirubin, Direct: 0 mg/dL (ref 0.0–0.3)
Total Bilirubin: 0.3 mg/dL (ref 0.2–1.2)
Total Protein: 7.6 g/dL (ref 6.0–8.3)

## 2014-10-09 LAB — BASIC METABOLIC PANEL
BUN: 12 mg/dL (ref 6–23)
CO2: 27 mEq/L (ref 19–32)
Calcium: 9.1 mg/dL (ref 8.4–10.5)
Chloride: 105 mEq/L (ref 96–112)
Creatinine, Ser: 0.7 mg/dL (ref 0.4–1.2)
GFR: 107.74 mL/min (ref >60.00)
Glucose, Bld: 121 mg/dL — ABNORMAL HIGH (ref 70–99)
Potassium: 3.6 mEq/L (ref 3.5–5.1)
Sodium: 138 mEq/L (ref 135–145)

## 2014-10-09 LAB — LIPID PANEL
Cholesterol: 187 mg/dL (ref 0–200)
HDL: 48.7 mg/dL (ref >39.00)
LDL Cholesterol: 113 mg/dL — ABNORMAL HIGH (ref 0–99)
NonHDL: 138.3
Total CHOL/HDL Ratio: 4
Triglycerides: 125 mg/dL (ref 0.0–149.0)
VLDL: 25 mg/dL (ref 0.0–40.0)

## 2014-10-09 LAB — HEMOGLOBIN A1C: Hgb A1c MFr Bld: 6.6 % — ABNORMAL HIGH (ref 4.6–6.5)

## 2014-10-09 MED ORDER — HYDROCODONE-HOMATROPINE 5-1.5 MG/5ML PO SYRP: 5.0000 mL | ORAL_SOLUTION | Freq: Four times a day (QID) | ORAL | Status: DC | PRN

## 2014-10-09 MED ORDER — LEVOFLOXACIN 250 MG PO TABS: 250.0000 mg | ORAL_TABLET | Freq: Every day | ORAL | Status: DC

## 2014-10-09 MED ORDER — TELMISARTAN-HCTZ 80-12.5 MG PO TABS: 1.0000 | ORAL_TABLET | Freq: Every day | ORAL | Status: DC

## 2014-10-09 NOTE — Progress Notes (Signed)
Pre visit review using our clinic review tool, if applicable. No additional management support is needed unless otherwise documented below in the visit note. 

## 2014-10-09 NOTE — Patient Instructions (Addendum)
.  Please take all new medication as prescribed - the antibiotic, and the cough medicine  You can also take Mucinex (or it's generic off brand) for congestion, and tylenol as needed for pain.  Derrek Monaco continue all other medications as before, and refills have been done if requested.  Please have the pharmacy call with any other refills you may need.  You will be contacted regarding the referral for: Bariatric surgury (general surgury)  Please keep your appointments with your specialists as you may have planned  Please go to the LAB in the Basement (turn left off the elevator) for the tests to be done today  You will be contacted by phone if any changes need to be made immediately.  Otherwise, you will receive a letter about your results with an explanation, but please check with MyChart first.  Please remember to sign up for MyChart if you have not done so, as this will be important to you in the future with finding out test results, communicating by private email, and scheduling acute appointments online when needed.  Please return in 6 months, or sooner if needed, with Lab testing done 3-5 days before

## 2014-10-09 NOTE — Progress Notes (Signed)
Subjective:    Patient ID: [name], female    DOB: [birth year], 58 y.o.   MRN: [id number]  HPI   Here with 2-3 days acute onset fever, facial pain, pressure, headache, general weakness and malaise, and greenish d/c, with mild ST and cough, but pt denies chest pain, wheezing, increased sob or doe, orthopnea, PND, increased LE swelling, palpitations, dizziness or syncope.  Also some rattling in the chest, ? Wheezing,  Has gained much wt due to diet;  Asks for bariatric referral.  Wt Readings from Last 3 Encounters:  10/09/14 246 lb 8 oz (111.812 kg)  10/01/14 246 lb 11.2 oz (111.902 kg)  03/19/14 237 lb (107.502 kg)   Past Medical History  Diagnosis Date  . HYPERLIPIDEMIA 05/11/2008  . OBSTRUCTIVE SLEEP APNEA 05/11/2008  . RASH-NONVESICULAR 06/29/2008  . COLONIC POLYPS, HX OF 05/11/2008  . GENITAL HERPES, HX OF 08/08/2009  . Inverted nipple   . Menorrhagia   . 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 LLQ pain   . Hernia   . Low iron   . H/O gonorrhea   . Bacterial infection   . Trichomonas   . H/O irritable bowel syndrome   . Breast cancer     2012  . Hypertension   . Arthritis   . Abscess of buttock   . Diabetes mellitus without complication   . Allergy   . Dysrhythmia   . H/O hiatal hernia   . Anemia    Past Surgical History  Procedure Laterality Date  . Cesarean section    . Right achilles tendon      and left  . S/p ear surgury    . Right ovarian cyst      hx  . S/p extra uterine fibroid  2006  . S/p left knee replacement  2007  . Dilation and curettage of uterus    . Uterine fibroid surgery  2006  . Breast cyst excision  1973  . Breast lumpectomy    . Axillary lymph node dissection    . Colonoscopy    . Breast lumpectomy with needle localization Right 12/20/2013    Procedure: EXCISION RIGHT BREAST MASS WITH NEEDLE LOCALIZATION;  Surgeon: Stark Klein, MD;  Location: [place];  Service: General;  Laterality: Right;    reports that she has never smoked. She  has never used smokeless tobacco. She reports that she drinks alcohol. She reports that she does not use illicit drugs. family history includes Alcohol abuse in her father; Cancer in her maternal aunt; Diabetes in her mother; Heart disease in her father; Hypertension in her mother; Stroke in her mother. There is no history of Colon cancer. Allergies  Allergen Reactions  . Codeine Nausea Only  . Cymbalta [Duloxetine Hcl] Other (See Comments)    Head felt funny, ? thinking not right  . Darvon Nausea Only  . Propoxyphene Hcl Nausea Only   Current Outpatient Prescriptions on File Prior to Visit  Medication Sig Dispense Refill  . calcium carbonate (OS-CAL - DOSED IN MG OF ELEMENTAL CALCIUM) 1250 MG tablet Take 1 tablet by mouth.    . clotrimazole-betamethasone (LOTRISONE) cream Apply topically 2 (two) times daily. 45 g 1  . Efinaconazole (JUBLIA) 10 % SOLN Apply one drop to each affected toenail was daily for 12 months 8 mL 5  . Glucosamine-Vitamin D (GLUCOSAMINE PLUS VITAMIN D PO) Take 1 tablet by mouth 2 (two) times daily.    [name] ibuprofen (ADVIL,MOTRIN) 200 MG tablet Take 400 mg  by mouth daily as needed (pain).    [name] liver oil-zinc oxide (DESITIN) 40 % ointment Apply 1 application topically as needed for irritation.    . Luliconazole (LUZU) 1 % CREA Applied twice daily to the affected area of both feet for 2-4 weeks 60 g 2  . metFORMIN (GLUCOPHAGE-XR) 500 MG 24 hr tablet TAKE 1 TABLET BY MOUTH EVERY DAY WITH BREAKFAST 90 tablet 3  . methocarbamol (ROBAXIN) 500 MG tablet Take 1 tablet (500 mg total) by mouth 2 (two) times daily. 20 tablet 0  . naproxen (NAPROSYN) 500 MG tablet Take 1 tablet (500 mg total) by mouth 2 (two) times daily. 30 tablet 0  . Naproxen Sodium (ALEVE PO) Take by mouth.    . tamoxifen (NOLVADEX) 20 MG tablet Take 1 tablet (20 mg total) by mouth daily. 90 tablet 3  . valACYclovir (VALTREX) 1000 MG tablet Take 1,000 mg by mouth daily as needed (herpes labialis).     . vitamin C  (ASCORBIC ACID) 500 MG tablet Take 500 mg by mouth daily.    . [DISCONTINUED] tolterodine (DETROL LA) 4 MG 24 hr capsule Take 4 mg by mouth daily.     No current facility-administered medications on file prior to visit.   Review of Systems  Constitutional: Negative for unusual diaphoresis or other sweats  HENT: Negative for ringing in ear Eyes: Negative for double vision or worsening visual disturbance.  Respiratory: Negative for choking and stridor.   Gastrointestinal: Negative for vomiting or other signifcant bowel change Genitourinary: Negative for hematuria or decreased urine volume.  Musculoskeletal: Negative for other MSK pain or swelling Skin: Negative for color change and worsening wound.  Neurological: Negative for tremors and numbness other than noted  Psychiatric/Behavioral: Negative for decreased concentration or agitation other than above       Objective:   Physical Exam BP 122/88 mmHg  Pulse 82  Temp(Src) 98.6 F (37 C) (Oral)  Ht 4\' 10"  (1.473 m)  Wt 246 lb 8 oz (111.812 kg)  BMI 51.53 kg/m2  SpO2 99% VS noted, mild ill Constitutional: Pt appears well-developed, well-nourished.  HENT: Head: NCAT.  Right Ear: External ear normal.  Left Ear: External ear normal.  Eyes: . Pupils are equal, round, and reactive to light. Conjunctivae and EOM are normal Bilat tm's with mild erythema.  Max sinus areas non tender.  Pharynx with mild erythema, no exudate Neck: Normal range of motion. Neck supple.  Cardiovascular: Normal rate and regular rhythm.   Pulmonary/Chest: Effort normal and breath sounds without wheezes or rales Neurological: Pt is alert. Not confused , motor grossly intact Skin: Skin is warm. No rash Psychiatric: Pt behavior is normal. No agitation.     Assessment & Plan:

## 2014-10-18 NOTE — Assessment & Plan Note (Signed)
[place] for bariatric referral,  to f/u any worsening symptoms or concerns

## 2014-10-18 NOTE — Assessment & Plan Note (Signed)
Mild to mod, for antibx course,  to f/u any worsening symptoms or concerns 

## 2014-10-18 NOTE — Assessment & Plan Note (Signed)
stable overall by history and exam, recent data reviewed with pt, and pt to continue medical treatment as before,  to f/u any worsening symptoms or concerns Lab Results  Component Value Date   HGBA1C 6.6* 10/09/2014   Pt to call for onset worsening cbg > 200 or polys

## 2014-10-24 ENCOUNTER — Other Ambulatory Visit: Payer: Self-pay | Admitting: Internal Medicine

## 2014-10-25 ENCOUNTER — Other Ambulatory Visit (INDEPENDENT_AMBULATORY_CARE_PROVIDER_SITE_OTHER): Payer: 59

## 2014-10-25 ENCOUNTER — Telehealth: Payer: Self-pay | Admitting: Internal Medicine

## 2014-10-25 DIAGNOSIS — H0520 Unspecified exophthalmos: Secondary | ICD-10-CM

## 2014-10-25 LAB — T4, FREE: Free T4: 0.85 ng/dL (ref 0.60–1.60)

## 2014-10-25 LAB — TSH: TSH: 1.51 u[IU]/mL (ref 0.35–4.50)

## 2014-10-25 NOTE — Telephone Encounter (Signed)
Call from Dr Gwynn Burly, optho in [place] - ? Proptosis  Asks for [place] to check her thyroid , ? Need for other imaging such as CT or MRI  OK for TSH, Free t4  Robin to call pt - ok to come to [facility] for this to be done at her convenience  Consider OV after that if Thyroid Normal, as may need imaging to rule out other cause if not thyroid

## 2014-10-25 NOTE — Telephone Encounter (Signed)
Called the patient informed of PCP instructions.   The patient did verbalize understanding and agreed to follow instructions.

## 2014-10-25 NOTE — Telephone Encounter (Signed)
Called the patient left msg to call back

## 2014-10-26 ENCOUNTER — Telehealth: Payer: Self-pay | Admitting: Internal Medicine

## 2014-10-26 NOTE — Telephone Encounter (Signed)
Rec'd from [facility] forward 4 pages  to Dr. Jenny Reichmann

## 2014-10-31 ENCOUNTER — Ambulatory Visit (INDEPENDENT_AMBULATORY_CARE_PROVIDER_SITE_OTHER): Payer: 59 | Admitting: Internal Medicine

## 2014-10-31 ENCOUNTER — Encounter: Payer: Self-pay | Admitting: Internal Medicine

## 2014-10-31 VITALS — BP 124/80 | HR 69 | Temp 98.1°F | Ht <= 58 in | Wt 244.2 lb

## 2014-10-31 DIAGNOSIS — H0520 Unspecified exophthalmos: Secondary | ICD-10-CM

## 2014-10-31 DIAGNOSIS — H538 Other visual disturbances: Secondary | ICD-10-CM

## 2014-10-31 DIAGNOSIS — H109 Unspecified conjunctivitis: Secondary | ICD-10-CM

## 2014-10-31 MED ORDER — AMOXICILLIN-POT CLAVULANATE 875-125 MG PO TABS: 1.0000 | ORAL_TABLET | Freq: Two times a day (BID) | ORAL | Status: DC

## 2014-10-31 NOTE — Progress Notes (Signed)
Pre visit review using our clinic review tool, if applicable. No additional management support is needed unless otherwise documented below in the visit note. 

## 2014-10-31 NOTE — Assessment & Plan Note (Signed)
Mild to mod, cont same tx,  to f/u any worsening symptoms or concerns

## 2014-10-31 NOTE — Addendum Note (Signed)
Addended by: Biagio Borg on: 10/31/2014 10:27 AM   Modules accepted: Orders

## 2014-10-31 NOTE — Assessment & Plan Note (Signed)
TSH normal , for MRI, consider refer optho [facility]

## 2014-10-31 NOTE — Patient Instructions (Signed)
Please take all new medication as prescribed - - the antibiotic  Please continue all other medications as before  Please have the pharmacy call with any other refills you may need.  Please keep your appointments with your specialists as you may have planned  Please call your local opthomologist for reexam today  You will be contacted regarding the referral for: MRI - to see [facility] now  Please call if you would like referral to [facility]

## 2014-10-31 NOTE — Assessment & Plan Note (Signed)
Unclear etiology, pt plans to call optho for f/U appt today asap

## 2014-10-31 NOTE — Progress Notes (Signed)
Subjective:    Patient ID: [name], female    DOB: [birth year], 59 y.o.   MRN: [id number]  HPI  Here with left eye pain and bulging, [facility] with left eye pain, swelling and aching periorbital, assoc with shooting pains that seems to radiate back on the left side to behind the ear. No fever. Tx with tobramycin for conjunctivitis per [facility], then changed to tobramycin/dexamethasone gtt per optho.  Has some baseline near sightedness, but no vision problem until for some today woke with right blurry vision.  Some improved in last few hours but still overall blurry to whole vision on left. No help overall with stopping her eyeline this am.  No current appt for optho planned, but pt is calling now for appt today. Pt denies new neurological symptoms such as new headache, or facial or extremity weakness or numbness other than above.  Recent TSH normal.  Glaucoma neg.  Pt does not recall eye dilated exam recent, told not done due to the ongoing infection.  Of note pt was seen here 2 wks ago with Acute URI, did not take all of levaquin. Past Medical History  Diagnosis Date  . HYPERLIPIDEMIA 05/11/2008  . OBSTRUCTIVE SLEEP APNEA 05/11/2008  . RASH-NONVESICULAR 06/29/2008  . COLONIC POLYPS, HX OF 05/11/2008  . GENITAL HERPES, HX OF 08/08/2009  . Inverted nipple   . Menorrhagia   . 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 LLQ pain   . Hernia   . Low iron   . H/O gonorrhea   . Bacterial infection   . Trichomonas   . H/O irritable bowel syndrome   . Breast cancer     2012  . Hypertension   . Arthritis   . Abscess of buttock   . Diabetes mellitus without complication   . Allergy   . Dysrhythmia   . H/O hiatal hernia   . Anemia    Past Surgical History  Procedure Laterality Date  . Cesarean section    . Right achilles tendon      and left  . S/p ear surgury    . Right ovarian cyst      hx  . S/p extra uterine fibroid  2006  . S/p left knee replacement   2007  . Dilation and curettage of uterus    . Uterine fibroid surgery  2006  . Breast cyst excision  1973  . Breast lumpectomy    . Axillary lymph node dissection    . Colonoscopy    . Breast lumpectomy with needle localization Right 12/20/2013    Procedure: EXCISION RIGHT BREAST MASS WITH NEEDLE LOCALIZATION;  Surgeon: Stark Klein, MD;  Location: [facility];  Service: General;  Laterality: Right;    reports that she has never smoked. She has never used smokeless tobacco. She reports that she drinks alcohol. She reports that she does not use illicit drugs. family history includes Alcohol abuse in her father; Cancer in her maternal aunt; Diabetes in her mother; Heart disease in her father; Hypertension in her mother; Stroke in her mother. There is no history of Colon cancer. Allergies  Allergen Reactions  . Codeine Nausea Only  . Cymbalta [Duloxetine Hcl] Other (See Comments)    Head felt funny, ? thinking not right  . Darvon Nausea Only  . Propoxyphene Hcl Nausea Only   Current Outpatient Prescriptions on File Prior to Visit  Medication Sig Dispense Refill  . calcium carbonate (OS-CAL - DOSED  IN MG OF ELEMENTAL CALCIUM) 1250 MG tablet Take 1 tablet by mouth.    . clotrimazole-betamethasone (LOTRISONE) cream Apply topically 2 (two) times daily. 45 g 1  . Efinaconazole (JUBLIA) 10 % SOLN Apply one drop to each affected toenail was daily for 12 months 8 mL 5  . Glucosamine-Vitamin D (GLUCOSAMINE PLUS VITAMIN D PO) Take 1 tablet by mouth 2 (two) times daily.    [name] HYDROcodone-homatropine (HYCODAN) 5-1.5 MG/5ML syrup Take 5 mLs by mouth every 6 (six) hours as needed for cough. 180 mL 0  . liver oil-zinc oxide (DESITIN) 40 % ointment Apply 1 application topically as needed for irritation.    . Luliconazole (LUZU) 1 % CREA Applied twice daily to the affected area of both feet for 2-4 weeks 60 g 2  . metFORMIN (GLUCOPHAGE-XR) 500 MG 24 hr tablet TAKE 1 TABLET BY MOUTH EVERY DAY WITH BREAKFAST 90 tablet  3  . naproxen (NAPROSYN) 500 MG tablet Take 1 tablet (500 mg total) by mouth 2 (two) times daily. 30 tablet 0  . Naproxen Sodium (ALEVE PO) Take by mouth.    . tamoxifen (NOLVADEX) 20 MG tablet Take 1 tablet (20 mg total) by mouth daily. 90 tablet 3  . telmisartan-hydrochlorothiazide (MICARDIS HCT) 80-12.5 MG per tablet TAKE 1 TABLET EVERY DAY 90 tablet 3  . valACYclovir (VALTREX) 1000 MG tablet Take 1,000 mg by mouth daily as needed (herpes labialis).     . vitamin C (ASCORBIC ACID) 500 MG tablet Take 500 mg by mouth daily.    . [DISCONTINUED] tolterodine (DETROL LA) 4 MG 24 hr capsule Take 4 mg by mouth daily.     No current facility-administered medications on file prior to visit.   Review of Systems  Constitutional: Negative for unusual diaphoresis or other sweats  HENT: Negative for ringing in ear Eyes: Negative for double vision or worsening visual disturbance.  Respiratory: Negative for choking and stridor.   Gastrointestinal: Negative for vomiting or other signifcant bowel change Genitourinary: Negative for hematuria or decreased urine volume.  Musculoskeletal: Negative for other MSK pain or swelling Skin: Negative for color change and worsening wound.  Neurological: Negative for tremors and numbness other than noted  Psychiatric/Behavioral: Negative for decreased concentration or agitation other than above       Objective:   Physical Exam BP 124/80 mmHg  Pulse 69  Temp(Src) 98.1 F (36.7 C) (Oral)  Ht 4\' 10"  (1.473 m)  Wt 244 lb 3.2 oz (110.768 kg)  BMI 51.05 kg/m2 VS noted,  Constitutional: Pt appears well-developed, well-nourished.  HENT: Head: NCAT.  Right Ear: External ear normal.  Left Ear: External ear normal. Left TM trace erythema, shiny with post fluid Eyes: . Pupils are equal, round, and reactive to light. Conjunctivae and EOM are normal, small left proptosis, upper and lower lid swelling and nondiscrete mild tender area supra/peri/sub orbital noted, ? Trace  erythema, no fluctuance or drainage or other skin change Neck: Normal range of motion. Neck supple.  Cardiovascular: Normal rate and regular rhythm.   Pulmonary/Chest: Effort normal and breath sounds without rales or wheezing.  Neurological: Pt is alert. Not confused , motor grossly intact, cn 2-12 intact Skin: Skin is warm. No rash Psychiatric: Pt behavior is normal. No agitation.     Assessment & Plan:

## 2014-11-01 ENCOUNTER — Telehealth: Payer: Self-pay | Admitting: *Deleted

## 2014-11-01 NOTE — Telephone Encounter (Signed)
[facility] Night - Client TELEPHONE [facility] Patient Name: [name] Gender: Female DOB: Apr 26, 1956 Age: 59 Y 1 M 7 D Return Phone Number: [phone] (Primary) Address: [address] City/State/Zip: [place] [address] Client [facility] Night - Client Client Site [facility] - Night Physician [name], [place] Type Call Call Type Triage / Clinical Relationship To Patient Self Return Phone Number [phone] (Primary) Chief Complaint Mouth Symptoms Initial Comment Caller states c/o white spot on back of throat, roof of mouth is sore, is on antibiotics, wants to know if Rx will take care of this Nurse Assessment Nurse: Rock Nephew, RN, [name] Date/Time (Eastern Time): 11/01/2014 8:00:21 AM Confirm and document reason for call. If symptomatic, describe symptoms. ---Caller states c/o white spot on back of throat, roof of mouth is sore, is on antibiotics, wants to know if Rx will take care of this Has the patient traveled out of the country within the last 30 days? ---No Does the patient require triage? ---Yes Related visit to physician within the last 2 weeks? ---Yes Nurse: Rock Nephew, RN, [name] Date/Time (Eastern Time): 11/01/2014 8:13:42 AM Confirm and document reason for call. If symptomatic, describe symptoms. ---Caller states she has a white spot on back of throat, roof of mouth is sore, is on antibiotics, wants to know if Rx will take care of this? She saw MD yesterday and he was aware of the white spot at the back of her throat but she did not know the reason she was started on antibiotics ( Amox-Clav ) . Has the patient traveled out of the country within the last 30 days? ---Not Applicable Does the patient require triage? ---No Please document clinical information provided and list any resource used. ---I advised caller to speak with PCP when office opens today since I could not  answer her question for her. Caller verbalized understanding. Guidelines Guideline Title Affirmed Question Affirmed Notes Nurse Date/Time (Eastern Time) Disp. Time Eilene Ghazi Time) Disposition Final User 11/01/2014 7:20:07 AM Send To Clinical Follow Up Rich Brave, Amy 11/01/2014 8:01:39 AM Send To RN Personal Rock Nephew, RN, [name] 11/01/2014 8:20:58 AM Clinical Call Yes Rock Nephew, RN, Aldean Baker NOTE: All timestamps contained within this report are represented as [place] Standard Time. CONFIDENTIALTY NOTICE: This fax transmission is intended only for the addressee. It contains information that is legally privileged, confidential or otherwise protected from use or disclosure. If you are not the intended recipient, you are strictly prohibited from reviewing, disclosing, copying using or disseminating any of this information or taking any action in reliance on or regarding this information. If you have received this fax in error, please notify us immediately by telephone so that we can arrange for its return to [place]. Phone: [phone], Toll-Free: [phone], Fax: [phone] Page: 2 of 2 Call Id: [id number] After Care Instructions Given Call Event Type User Date / Time Description

## 2014-11-29 ENCOUNTER — Encounter: Payer: 59 | Admitting: Internal Medicine

## 2014-12-07 ENCOUNTER — Ambulatory Visit
Admission: RE | Admit: 2014-12-07 | Discharge: 2014-12-07 | Disposition: A | Discharge status: Home / Self Care | Payer: 59 | Source: Ambulatory Visit | Attending: Internal Medicine | Admitting: Internal Medicine

## 2014-12-07 DIAGNOSIS — H538 Other visual disturbances: Secondary | ICD-10-CM

## 2014-12-07 DIAGNOSIS — H109 Unspecified conjunctivitis: Secondary | ICD-10-CM

## 2014-12-07 DIAGNOSIS — H0520 Unspecified exophthalmos: Secondary | ICD-10-CM

## 2014-12-07 MED ORDER — GADOBENATE DIMEGLUMINE 529 MG/ML IV SOLN
20.0000 mL | Freq: Once | INTRAVENOUS | Status: AC | PRN
  Administered 2014-12-07: 20 mL via INTRAVENOUS

## 2014-12-28 ENCOUNTER — Other Ambulatory Visit: Payer: Self-pay | Admitting: Internal Medicine

## 2015-01-16 ENCOUNTER — Encounter (HOSPITAL_COMMUNITY)
Admission: RE | Admit: 2015-01-16 | Discharge: 2015-01-16 | Disposition: A | Discharge status: Home / Self Care | Payer: 59 | Source: Ambulatory Visit | Attending: Obstetrics and Gynecology | Admitting: Obstetrics and Gynecology

## 2015-01-16 ENCOUNTER — Other Ambulatory Visit: Payer: Self-pay

## 2015-01-16 ENCOUNTER — Encounter (HOSPITAL_COMMUNITY): Payer: Self-pay

## 2015-01-16 DIAGNOSIS — Z01812 Encounter for preprocedural laboratory examination: Secondary | ICD-10-CM | POA: Diagnosis not present

## 2015-01-16 DIAGNOSIS — Z01810 Encounter for preprocedural cardiovascular examination: Secondary | ICD-10-CM | POA: Diagnosis not present

## 2015-01-16 HISTORY — DX: Unspecified urinary incontinence: R32

## 2015-01-16 HISTORY — DX: Reserved for inherently not codable concepts without codable children: IMO0001

## 2015-01-16 HISTORY — DX: Frequency of micturition: R35.0

## 2015-01-16 HISTORY — DX: Headache, unspecified: R51.9

## 2015-01-16 HISTORY — DX: Furuncle of buttock: L02.32

## 2015-01-16 HISTORY — DX: Reserved for concepts with insufficient information to code with codable children: IMO0002

## 2015-01-16 HISTORY — DX: Headache: R51

## 2015-01-16 LAB — CBC
HCT: 34.8 % — ABNORMAL LOW (ref 36.0–46.0)
Hemoglobin: 11.3 g/dL — ABNORMAL LOW (ref 12.0–15.0)
MCH: 27.7 pg (ref 26.0–34.0)
MCHC: 32.5 g/dL (ref 30.0–36.0)
MCV: 85.3 fL (ref 78.0–100.0)
Platelets: 237 10*3/uL (ref 150–400)
RBC: 4.08 MIL/uL (ref 3.87–5.11)
RDW: 14.8 % (ref 11.5–15.5)
WBC: 4.7 10*3/uL (ref 4.0–10.5)

## 2015-01-16 LAB — BASIC METABOLIC PANEL
Anion gap: 10 (ref 5–15)
BUN: 12 mg/dL (ref 6–23)
CO2: 24 mmol/L (ref 19–32)
Calcium: 8.9 mg/dL (ref 8.4–10.5)
Chloride: 103 mmol/L (ref 96–112)
Creatinine, Ser: 0.72 mg/dL (ref 0.50–1.10)
GFR calc Af Amer: >90 mL/min (ref >90)
GFR calc non Af Amer: >90 mL/min (ref >90)
Glucose, Bld: 109 mg/dL — ABNORMAL HIGH (ref 70–99)
Potassium: 3.4 mmol/L — ABNORMAL LOW (ref 3.5–5.1)
Sodium: 137 mmol/L (ref 135–145)

## 2015-01-16 NOTE — Patient Instructions (Addendum)
Your procedure is scheduled on:  Monday, February 04, 2015  Enter through the Main Entrance of [facility] at: 7:30 A.M.  Pick up the phone at the desk and dial [phone].  Call this number if you have problems the morning of surgery: [phone].  Remember: Do NOT eat food or  drink liquids:   After midnight Sunday, February 03, 2015. Take these medicines the morning of surgery or night before with a SIP OF WATER:  Telmisartan-hyrdochlorothiazide (Micardis-HCT)  DO NOT TAKE METFORMIN 24 HOURS PRIOR TO SURGERY!  Do NOT wear jewelry (body piercing), metal hair clips/bobby pins, make-up, or nail polish. Do NOT wear lotions, powders, or perfumes.  You may wear deoderant. Do NOT shave for 48 hours prior to surgery. Do NOT bring valuables to the hospital. Contacts, dentures, or bridgework may not be worn into surgery.  Have a responsible adult drive you home and stay with you for 24 hours after your procedure

## 2015-01-29 ENCOUNTER — Other Ambulatory Visit: Payer: Self-pay | Admitting: Obstetrics and Gynecology

## 2015-02-04 ENCOUNTER — Encounter (HOSPITAL_COMMUNITY): Admission: RE | Payer: Self-pay | Source: Ambulatory Visit

## 2015-02-04 ENCOUNTER — Ambulatory Visit (HOSPITAL_COMMUNITY): Admission: RE | Admit: 2015-02-04 | Payer: 59 | Source: Ambulatory Visit | Admitting: Obstetrics and Gynecology

## 2015-02-04 SURGERY — DILATATION & CURETTAGE/HYSTEROSCOPY WITH RESECTOCOPE
Anesthesia: Choice

## 2015-02-21 ENCOUNTER — Telehealth: Payer: Self-pay | Admitting: Oncology

## 2015-02-21 NOTE — Telephone Encounter (Signed)
Return voicemail. Confirmed May appointments.

## 2015-02-25 ENCOUNTER — Other Ambulatory Visit (HOSPITAL_BASED_OUTPATIENT_CLINIC_OR_DEPARTMENT_OTHER): Payer: 59

## 2015-02-25 DIAGNOSIS — C50912 Malignant neoplasm of unspecified site of left female breast: Secondary | ICD-10-CM

## 2015-02-25 LAB — CBC WITH DIFFERENTIAL/PLATELET
BASO%: 0.4 % (ref 0.0–2.0)
Basophils Absolute: 0 10*3/uL (ref 0.0–0.1)
EOS%: 1.5 % (ref 0.0–7.0)
Eosinophils Absolute: 0.1 10*3/uL (ref 0.0–0.5)
HCT: 35.9 % (ref 34.8–46.6)
HGB: 11.3 g/dL — ABNORMAL LOW (ref 11.6–15.9)
LYMPH%: 30.1 % (ref 14.0–49.7)
MCH: 27.1 pg (ref 25.1–34.0)
MCHC: 31.5 g/dL (ref 31.5–36.0)
MCV: 86.1 fL (ref 79.5–101.0)
MONO#: 0.5 10*3/uL (ref 0.1–0.9)
MONO%: 9 % (ref 0.0–14.0)
NEUT#: 3.1 10*3/uL (ref 1.5–6.5)
NEUT%: 59 % (ref 38.4–76.8)
Platelets: 221 10*3/uL (ref 145–400)
RBC: 4.17 10*6/uL (ref 3.70–5.45)
RDW: 15 % — ABNORMAL HIGH (ref 11.2–14.5)
WBC: 5.3 10*3/uL (ref 3.9–10.3)
lymph#: 1.6 10*3/uL (ref 0.9–3.3)

## 2015-02-25 LAB — COMPREHENSIVE METABOLIC PANEL (CC13)
ALT: 15 U/L (ref 0–55)
AST: 15 U/L (ref 5–34)
Albumin: 3.3 g/dL — ABNORMAL LOW (ref 3.5–5.0)
Alkaline Phosphatase: 38 U/L — ABNORMAL LOW (ref 40–150)
Anion Gap: 11 mEq/L (ref 3–11)
BUN: 15.9 mg/dL (ref 7.0–26.0)
CO2: 24 mEq/L (ref 22–29)
Calcium: 9 mg/dL (ref 8.4–10.4)
Chloride: 106 mEq/L (ref 98–109)
Creatinine: 0.7 mg/dL (ref 0.6–1.1)
EGFR: >90 mL/min/{1.73_m2} (ref >90)
Glucose: 103 mg/dl (ref 70–140)
Potassium: 3.6 mEq/L (ref 3.5–5.1)
Sodium: 141 mEq/L (ref 136–145)
Total Bilirubin: 0.29 mg/dL (ref 0.20–1.20)
Total Protein: 7.5 g/dL (ref 6.4–8.3)

## 2015-02-26 ENCOUNTER — Telehealth: Payer: Self-pay | Admitting: Internal Medicine

## 2015-02-26 NOTE — Telephone Encounter (Signed)
Pt called and would like someone to call her about her result of her MRI that she had done on 2/19.    Best number [phone]

## 2015-02-26 NOTE — Telephone Encounter (Signed)
Called patient and gave her the results of her MRI

## 2015-03-04 ENCOUNTER — Other Ambulatory Visit: Payer: Self-pay | Admitting: Nurse Practitioner

## 2015-03-04 ENCOUNTER — Telehealth: Payer: Self-pay

## 2015-03-04 ENCOUNTER — Encounter: Payer: Self-pay | Admitting: Nurse Practitioner

## 2015-03-04 ENCOUNTER — Telehealth: Payer: Self-pay | Admitting: Oncology

## 2015-03-04 ENCOUNTER — Ambulatory Visit (HOSPITAL_BASED_OUTPATIENT_CLINIC_OR_DEPARTMENT_OTHER): Payer: 59 | Admitting: Nurse Practitioner

## 2015-03-04 VITALS — BP 145/74 | HR 79 | Temp 97.9°F | Resp 19 | Ht 59.0 in | Wt 237.4 lb

## 2015-03-04 DIAGNOSIS — B3749 Other urogenital candidiasis: Secondary | ICD-10-CM | POA: Diagnosis not present

## 2015-03-04 DIAGNOSIS — C50912 Malignant neoplasm of unspecified site of left female breast: Secondary | ICD-10-CM | POA: Diagnosis not present

## 2015-03-04 DIAGNOSIS — E669 Obesity, unspecified: Secondary | ICD-10-CM

## 2015-03-04 MED ORDER — NYSTATIN-TRIAMCINOLONE 100000-0.1 UNIT/GM-% EX CREA: 1.0000 "application " | TOPICAL_CREAM | Freq: Two times a day (BID) | CUTANEOUS | Status: DC

## 2015-03-04 NOTE — Progress Notes (Signed)
ID: [name]   DOB: 08-06-1956  MR#: [id number]  QXI#:[id number]   PCP: Cathlean Cower, MD GYN:  Kendall Flack, MD SU:  Excell Seltzer, MD Other:  Thea Silversmith, MD;  Jari Pigg, MD;  Renato Shin, MD;  Lowella Bandy, MD;  Erskine Emery, MD  CHIEF COMPLAINT:  Left Breast Cancer TREATMENT: receiving active anti-estrogen therapy  HISTORY OF PRESENT ILLNESS: From the original intake note:  The patient had screening mammography in January 16, 2011 (she missed her mammogram on January 05, 2010) and this showed some changes in the left breast, which measured 1.1 cm.  Left breast ultrasound was obtained the same day and confirmed a small hypoechoic lobular area of nodularity measuring again 1.1 cm.  There was no other abnormality and the patient was set up for ultrasound-guided needle biopsy January 22, 2011.  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  Tumor was estrogen receptor positive at 82%, progesterone receptor positive at 92% with MIB-1 of 17% and no evidence of HER2 amplification, the ratio by CISH being 0.97.  With this information the patient was set up for bilateral breast MRI s and these were obtained January 29, 2011.  In the central left breast in the retroareolar location there was a spiculated enhancing mass with a regular margins measuring again 1.1 cm.  There were no other areas of abnormality.  She had her left lumpectomy and sentinel lymph node sampling on February 10, 2011.  The pathology report ([id number]) shows an 8-mm invasive ductal carcinoma, grade 1, involving 1/2 sentinel lymph nodes with a micrometastatic deposit.  The inferior margin was apparently broadly positive initially, but may have been cleared with subsequent excision in the same procedure.  The pathology report does not commit, but says the margin has been "likely cleared."   Her Oncotype DX gave her a recurrence score of 21%, predicting a  risk of recurrent for node positive patients in the 12% range.  She decided to forego chemotherapy, but proceded with radiation therapy. She completed radiation in August 2012, and started on tamoxifen the first week of September 2012.   Her subsequent history is as detailed below.  INTERVAL HISTORY: [name] returns today for follow up of her left breast cancer. At her last visit, she restarted the tamoxifen. She is tolerating it better than before, with fewer hot flashes but continued vaginal wetness. She had a period again in February that was very heavy and lasted 7 days. She has not had one since then. The interval history is remarkable for a left knee replacement. She still has left knee and hip pain. Her PCP and ophthalmologist have been following her for possible glaucoma. Her blood sugar has been stable but her pressure is elevated. She is inconsistent with her meds.  REVIEW OF SYSTEMS: [name] denies fevers, chills, nausea, vomiting, or changes in bowel or bladder habits. She has irritation and an occasional rash to her groin because of moisture. She denies shortness of breath, chest pain, cough, or palpitations. She does not sleep well at night and is fatigued during the day. She continues to work full time. She denies headaches, dizziness, or weakness, but does have some mild vision changes. A detailed review of systems is otherwise stable.   PAST MEDICAL HISTORY: Past Medical History  Diagnosis Date  . HYPERLIPIDEMIA 05/11/2008    Pt denies  . OBSTRUCTIVE SLEEP APNEA 05/11/2008  . RASH-NONVESICULAR 06/29/2008  . COLONIC POLYPS, HX OF  05/11/2008  . GENITAL HERPES, HX OF 08/08/2009  . Inverted nipple   . Menorrhagia   . 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 LLQ pain   . Hernia   . Low iron   . H/O gonorrhea   . Bacterial infection   . Trichomonas   . H/O irritable bowel syndrome   . Breast cancer     2012  . Hypertension   . Arthritis   . Abscess of buttock   . Diabetes mellitus  without complication   . Allergy   . H/O hiatal hernia   . Anemia   . Dysrhythmia   . Occasional numbness/prickling/tingling of fingers and toes     right foot, right hand  . Shortness of breath dyspnea     with exertion  . Urine frequency   . Incontinence in female   . Headache     "shooting pains" left side of head MRI done 2016  . Boil of buttock   Is significant for hypertension, obesity, early glaucoma, removal of a right breast cysts in 1972, total left knee replacement in 2007, possible irritable bowel syndrome, history of fibroid uterus surgery in 2006, history of C-section in 1981 and history of remote repair of the right and left Achilles tendons.  PAST SURGICAL HISTORY: Past Surgical History  Procedure Laterality Date  . Cesarean section    . Right achilles tendon      and left  . S/p ear surgury    . Right ovarian cyst      hx  . S/p extra uterine fibroid  2006  . S/p left knee replacement  2007  . Dilation and curettage of uterus    . Uterine fibroid surgery  2006  . Breast cyst excision  1973  . Breast lumpectomy    . Axillary lymph node dissection    . Colonoscopy    . Breast lumpectomy with needle localization Right 12/20/2013    Procedure: EXCISION RIGHT BREAST MASS WITH NEEDLE LOCALIZATION;  Surgeon: Stark Klein, MD;  Location: [facility];  Service: General;  Laterality: Right;  . Left achilles tendon repair      FAMILY HISTORY Family History  Problem Relation Age of Onset  . Diabetes Mother   . Hypertension Mother   . Stroke Mother   . Heart disease Father     COPD  . Alcohol abuse Father     ETOH dependence  . Colon cancer Neg Hx   . Cancer Maternal Aunt     breast  The patient's mother is alive..  The patient's father died in his early 66s from congestive heart failure.  The patient had five brothers and three sisters; most of theses siblings are half siblings, but in any case there is no history of breast or ovarian cancer in the immediate family.   There was one maternal aunt (out of a total of four) diagnosed with breast cancer in her 42s.  GYNECOLOGIC HISTORY: The patient had menarche age 80, she is still having regular periods.  She is Gx, P1, first pregnancy to term at age 49.  She stopped having menstrual periods August of 2012, but had one anomalous. In April of 2013.  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  She has been divorced more than 10 years and lives by herself at home with no pets.  Her one child, a son, died at age 97.   ADVANCED DIRECTIVES: not in place  HEALTH MAINTENANCE: (Updated 09/04/2013)  History  Substance Use Topics  . Smoking status: Never Smoker   . Smokeless tobacco: Never Used  . Alcohol Use: Yes     Comment: occasionally drinks wine     Colonoscopy: May 2013, Dr. Deatra Ina  PAP: UTD/Dr. Leo Grosser  Bone density: Not on file  Lipid panel: April 2014, Dr. Jenny Reichmann   Allergies  Allergen Reactions  . Codeine Nausea Only  . Cymbalta [Duloxetine Hcl] Other (See Comments)    Head felt funny, ? thinking not right  . Darvon Nausea Only  . Propoxyphene Hcl Nausea Only    Current Outpatient Prescriptions  Medication Sig Dispense Refill  . calcium carbonate (OS-CAL - DOSED IN MG OF ELEMENTAL CALCIUM) 1250 MG tablet Take 1 tablet by mouth.    . liver oil-zinc oxide (DESITIN) 40 % ointment Apply 1 application topically as needed for irritation.    . metFORMIN (GLUCOPHAGE-XR) 500 MG 24 hr tablet TAKE 1 TABLET BY MOUTH EVERY DAY WITH BREAKFAST 90 tablet 2  . Naproxen Sodium (ALEVE PO) Take by mouth.    . tamoxifen (NOLVADEX) 20 MG tablet Take 1 tablet (20 mg total) by mouth daily. 90 tablet 3  . telmisartan-hydrochlorothiazide (MICARDIS HCT) 80-12.5 MG per tablet TAKE 1 TABLET EVERY DAY 90 tablet 3  . clotrimazole-betamethasone (LOTRISONE) cream Apply topically 2 (two) times daily. (Patient not taking: Reported on 03/04/2015) 45 g 1  . Efinaconazole (JUBLIA) 10 % SOLN Apply one drop to  each affected toenail was daily for 12 months (Patient not taking: Reported on 03/04/2015) 8 mL 5  . Glucosamine-Vitamin D (GLUCOSAMINE PLUS VITAMIN D PO) Take 1 tablet by mouth 2 (two) times daily.    . Luliconazole (LUZU) 1 % CREA Applied twice daily to the affected area of both feet for 2-4 weeks (Patient not taking: Reported on 03/04/2015) 60 g 2  . nystatin-triamcinolone (MYCOLOG II) cream Apply 1 application topically 2 (two) times daily. 30 g 0  . valACYclovir (VALTREX) 1000 MG tablet Take 1,000 mg by mouth daily as needed (herpes labialis).     . [DISCONTINUED] tolterodine (DETROL LA) 4 MG 24 hr capsule Take 4 mg by mouth daily.     No current facility-administered medications for this visit.    OBJECTIVE: Middle-aged [name] in no acute distress Filed Vitals:   03/04/15 0843  BP: 145/74  Pulse: 79  Temp: 97.9 F (36.6 C)  Resp: 19     Body mass index is 47.92 kg/(m^2).    ECOG FS: 1 Filed Weights   03/04/15 0843  Weight: 237 lb 6.4 oz (107.684 kg)   Skin: warm, dry  HEENT: sclerae anicteric, conjunctivae pink, oropharynx clear. No thrush or mucositis.  Lymph Nodes: No cervical or supraclavicular lymphadenopathy  Lungs: clear to auscultation bilaterally, no rales, wheezes, or rhonci  Heart: regular rate and rhythm  Abdomen: round, soft, non tender, positive bowel sounds  Musculoskeletal: No focal spinal tenderness, no peripheral edema  Neuro: non focal, well oriented, positive affect  Breasts: left breast status post lumpectomy and radiation. No evidence of recurrent disease. Left axilla benign. Right breast unremarkable.  LAB RESULTS: Lab Results  Component Value Date   WBC 5.3 02/25/2015   NEUTROABS 3.1 02/25/2015   HGB 11.3* 02/25/2015   HCT 35.9 02/25/2015   MCV 86.1 02/25/2015   PLT 221 02/25/2015      Chemistry      Component Value Date/Time   NA 141 02/25/2015 0838   NA 137 01/16/2015 1025   K 3.6  02/25/2015 0838   K 3.4* 01/16/2015 1025    CL 103 01/16/2015 1025   CL 106 01/18/2013 0909   CO2 24 02/25/2015 0838   CO2 24 01/16/2015 1025   BUN 15.9 02/25/2015 0838   BUN 12 01/16/2015 1025   CREATININE 0.7 02/25/2015 0838   CREATININE 0.72 01/16/2015 1025      Component Value Date/Time   CALCIUM 9.0 02/25/2015 0838   CALCIUM 8.9 01/16/2015 1025   ALKPHOS 38* 02/25/2015 0838   ALKPHOS 44 10/09/2014 1602   AST 15 02/25/2015 0838   AST 26 10/09/2014 1602   ALT 15 02/25/2015 0838   ALT 26 10/09/2014 1602   BILITOT 0.29 02/25/2015 0838   BILITOT 0.3 10/09/2014 1602     Results for Jacqueline, Young (MRN [id number]) as of 10/01/2014 09:23  Ref. Range 12/28/2011 14:30 06/24/2012 08:47 01/18/2013 08:28 04/26/2013 08:33 09/18/2014 08:55  Estradiol, Ultra Sensitive No range found 41 15 15 22 17   Results for Jacqueline, Young (MRN [id number]) as of 10/01/2014 09:23  Ref. Range 12/28/2011 14:30 06/24/2012 08:47 01/18/2013 08:28 04/26/2013 08:33 09/18/2014 08:54  FSH No range found 10.2 10.8 10.2 8.7 21.4     STUDIES: Mammography at [facility] 01/12/2014 was negative. Overdue for 2016 scan.  ASSESSMENT: 59 y.o.  [place] Young   (1)  status post left lumpectomy and sentinel lymph node dissection April of 2012 for a T1b N1(mic) stage IB invasive ductal carcinoma, grade 1, estrogen receptor 82% and progesterone receptor 92% positive, with no HER-2 amplification, and an MIB-1-1 of 17%,   (2)  The patient's Oncotype DX score of 21 predicts a 13% risk of distant recurrence after 5 years of tamoxifen.  (3)  status post radiation completed August of 2012,   (4)  on tamoxifen from September of 2012 to April 2014  (5) the plan had been to initiate anastrozole in April 2014, but the patient had a menstrual cycle in May 2014, and resumed tamoxifen.  (a) discontinued tamoxifen on her own initiative June 2015 because of "aches and pains".  (b) resuming tamoxifen December 2015  (6) obesity: s/p [employer] program; considering bariattric  surgery   PLAN: Overall, [name] is doing well today. The labs were reviewed in detail and were entirely stable. She is overdue for her 2016 mammogram, so I have placed orders for this to be performed today. She is tolerating the tamoxifen as best she can and will continue it for at least another year before considering switching to an aromatase inhibitor. She just had a period in February.   I have sent in an antifungal cream for her to apply to the fold of her groin. She will work to keep this area as dry as possible.  [name] will return for labs and an office visit in 6 months. At this time we will recheck her hormone levels. She understands and agrees with this plan. She knows the goal of treatment in her case is cure. She has been encouraged to call with any issues that might arise before her next visit here.   Laurie Panda, NP    03/04/2015

## 2015-03-04 NOTE — Telephone Encounter (Signed)
lvm that insurance rejected mycolog II cream. S/w Heather NP and she said to follow the alternative instructions she gave the pt during visit today for OTC cream.

## 2015-03-04 NOTE — Telephone Encounter (Signed)
Gave avs & calendar for November. [facility] mammo 05/25 @ 8:15.

## 2015-03-13 ENCOUNTER — Encounter: Payer: Self-pay | Admitting: Internal Medicine

## 2015-04-02 ENCOUNTER — Ambulatory Visit (INDEPENDENT_AMBULATORY_CARE_PROVIDER_SITE_OTHER): Payer: 59 | Admitting: Podiatry

## 2015-04-02 ENCOUNTER — Encounter: Payer: Self-pay | Admitting: Podiatry

## 2015-04-02 VITALS — BP 130/67 | HR 80 | Temp 99.7°F | Resp 16

## 2015-04-02 DIAGNOSIS — M722 Plantar fascial fibromatosis: Secondary | ICD-10-CM

## 2015-04-02 NOTE — Progress Notes (Signed)
   Subjective:    Patient ID: [name], female    DOB: [birth year], 57 y.o.   MRN: [id number]  HPI  N-pain L-  arch and side of right  Foot O-gradual,has gotton worse C-aching pain A-walking and standing T- massaging and taking aleve/baby aspirin for pain   Patient presents here today with right arch foot severe pain   Review of Systems  All other systems reviewed and are negative.  Denies any history of foot ulceration or claudication    Objective:   Physical Exam  Orientated 3  Vascular: DP and PT pulses 2/4 bilaterally Capillary reflex immediate bilaterally No peripheral edema noted bilaterally  Neurological: Sensation to 10 g monofilament wire intact 5/5 bilaterally Ankle reflex equal and reactive bilaterally Vibratory sensation reactive bilaterally  Dermatological: Texture and turgor adequate bilaterally No skin skin lesions noted bilaterally Well-healed surgical scars posterior heels bilaterally  Musculoskeletal: HAV deformities bilaterally Palpable tenderness mid plantar fascial right without any palpable lesions which duplicates areas of discomfort Mild palpable tenderness to fifth right metatarsal without a palpable lesions Painful gait favoring right foot No restriction ankle, subtalar, midtarsal joints bilaterally      Assessment & Plan:   Assessment: Satisfactory neurovascular status Plantar fasciitis right causing painful gait Secondary area of tenderness base fifth right metatarsal associated with inverted painful gait  Plan: Review the results of patient's examination today Apply fascial taping right with instructions to leave on 3-5 days possible Using over-the-counter Aleve one twice a day 7 days Wear athletic style shoes  Reappoint 7 days

## 2015-04-02 NOTE — Patient Instructions (Signed)
Use over-the-counter Aleve 1 tablet twice a day 7 days If possible leave bandage on right foot up to 5 days and keep dry Wear athletic lace up style shoes   Plantar Fasciitis Plantar fasciitis is a common condition that causes foot pain. It is soreness (inflammation) of the band of tough fibrous tissue on the bottom of the foot that runs from the heel bone (calcaneus) to the ball of the foot. The cause of this soreness may be from excessive standing, poor fitting shoes, running on hard surfaces, being overweight, having an abnormal walk, or overuse (this is common in runners) of the painful foot or feet. It is also common in aerobic exercise dancers and ballet dancers. SYMPTOMS  Most people with plantar fasciitis complain of:  Severe pain in the morning on the bottom of their foot especially when taking the first steps out of bed. This pain recedes after a few minutes of walking.  Severe pain is experienced also during walking following a long period of inactivity.  Pain is worse when walking barefoot or up stairs DIAGNOSIS   Your caregiver will diagnose this condition by examining and feeling your foot.  Special tests such as X-rays of your foot, are usually not needed. PREVENTION   Consult a sports medicine professional before beginning a new exercise program.  Walking programs offer a good workout. With walking there is a lower chance of overuse injuries common to runners. There is less impact and less jarring of the joints.  Begin all new exercise programs slowly. If problems or pain develop, decrease the amount of time or distance until you are at a comfortable level.  Wear good shoes and replace them regularly.  Stretch your foot and the heel cords at the back of the ankle (Achilles tendon) both before and after exercise.  Run or exercise on even surfaces that are not hard. For example, asphalt is better than pavement.  Do not run barefoot on hard surfaces.  If using a  treadmill, vary the incline.  Do not continue to workout if you have foot or joint problems. Seek professional help if they do not improve. HOME CARE INSTRUCTIONS   Avoid activities that cause you pain until you recover.  Use ice or cold packs on the problem or painful areas after working out.  Only take over-the-counter or prescription medicines for pain, discomfort, or fever as directed by your caregiver.  Soft shoe inserts or athletic shoes with air or gel sole cushions may be helpful.  If problems continue or become more severe, consult a sports medicine caregiver or your own health care provider. Cortisone is a potent anti-inflammatory medication that may be injected into the painful area. You can discuss this treatment with your caregiver. MAKE SURE YOU:   Understand these instructions.  Will watch your condition.  Will get help right away if you are not doing well or get worse. Document Released: 06/30/2001 Document Revised: 12/28/2011 Document Reviewed: 08/29/2008 [facility] Patient Information 2015 [place], [place]. This information is not intended to replace advice given to you by your health care provider. Make sure you discuss any questions you have with your health care provider.

## 2015-04-09 ENCOUNTER — Ambulatory Visit (INDEPENDENT_AMBULATORY_CARE_PROVIDER_SITE_OTHER): Payer: 59 | Admitting: Podiatry

## 2015-04-09 ENCOUNTER — Encounter: Payer: Self-pay | Admitting: Podiatry

## 2015-04-09 VITALS — BP 134/77 | HR 60 | Resp 16

## 2015-04-09 DIAGNOSIS — M722 Plantar fascial fibromatosis: Secondary | ICD-10-CM

## 2015-04-09 NOTE — Patient Instructions (Signed)
Bent - Knee Calf Stretch  1) Stand an arm's length away from a wall. Place the palms of your hands on the wall. Step forward about 12 inches with the opposite foot.  2) Keeping toes pointed forward and both heels on the floor, bend both knees and lean forward. Hold this position for 60 seconds. Don't arch your back and don't hunch your shoulders.  3) Repeat this twice.  DO THIS STRETCHING TECHNIQUE 3 TIMES A DAY.   Stretching Exercises before Standing      Pull your toes up toward your nose and hold for 1 minute before standing.  A towel can assist with this exercise if you put the towel under the ball of your foot. This exercise reduces the intense    pain associated when changing from a seated to a standing position. This stretch can usually be the most beneficial if done before getting out of bed in the mornings. Plantar Fasciitis Plantar fasciitis is a common condition that causes foot pain. It is soreness (inflammation) of the band of tough fibrous tissue on the bottom of the foot that runs from the heel bone (calcaneus) to the ball of the foot. The cause of this soreness may be from excessive standing, poor fitting shoes, running on hard surfaces, being overweight, having an abnormal walk, or overuse (this is common in runners) of the painful foot or feet. It is also common in aerobic exercise dancers and ballet dancers. SYMPTOMS  Most people with plantar fasciitis complain of:  Severe pain in the morning on the bottom of their foot especially when taking the first steps out of bed. This pain recedes after a few minutes of walking.  Severe pain is experienced also during walking following a long period of inactivity.  Pain is worse when walking barefoot or up stairs DIAGNOSIS   Your caregiver will diagnose this condition by examining and feeling your foot.  Special tests such as X-rays of your foot, are usually not needed. PREVENTION   Consult a sports medicine professional  before beginning a new exercise program.  Walking programs offer a good workout. With walking there is a lower chance of overuse injuries common to runners. There is less impact and less jarring of the joints.  Begin all new exercise programs slowly. If problems or pain develop, decrease the amount of time or distance until you are at a comfortable level.  Wear good shoes and replace them regularly.  Stretch your foot and the heel cords at the back of the ankle (Achilles tendon) both before and after exercise.  Run or exercise on even surfaces that are not hard. For example, asphalt is better than pavement.  Do not run barefoot on hard surfaces.  If using a treadmill, vary the incline.  Do not continue to workout if you have foot or joint problems. Seek professional help if they do not improve. HOME CARE INSTRUCTIONS   Avoid activities that cause you pain until you recover.  Use ice or cold packs on the problem or painful areas after working out.  Only take over-the-counter or prescription medicines for pain, discomfort, or fever as directed by your caregiver.  Soft shoe inserts or athletic shoes with air or gel sole cushions may be helpful.  If problems continue or become more severe, consult a sports medicine caregiver or your own health care provider. Cortisone is a potent anti-inflammatory medication that may be injected into the painful area. You can discuss this treatment with your caregiver. MAKE   SURE YOU:   Understand these instructions.  Will watch your condition.  Will get help right away if you are not doing well or get worse. Document Released: 06/30/2001 Document Revised: 12/28/2011 Document Reviewed: 08/29/2008 [employer] Patient Information 2015 [employer]. This information is not intended to replace advice given to you by your health care provider. Make sure you discuss any questions you have with your health care provider.  

## 2015-04-09 NOTE — Progress Notes (Signed)
Patient ID: [name], female   DOB: 05/18/1956, 59 y.o.   MRN: [id number]  Subjective: This patient presents today for follow-up care for mid arch plantar fasciitis right. On the visit of 04/02/2015 a tape plantar fascial dressing was applied which patient left on 5 days. She says the intense pain did reduce. She also takes when necessary Aleve  Vascular: DP and PT pulses 2/4 bilaterally No peripheral edema noted bilaterally  Neurological: Deferred dermatological: No open skin lesions noted Well-healed surgical scars posterior heels bilaterally Palpable tenderness in the mid plantar fascial band right without any palpable lesions  Assessment: Plantar fasciitis right  Plan: I recommend bent knee stretching and icing for the plantar fasciitis right Okay to use Aleve  when necessary basis Plantar fasciitis strap dispensed to wear in the right foot with wearing instructions provided  Reappoint when necessary at patient's request

## 2015-04-10 ENCOUNTER — Ambulatory Visit: Payer: 59 | Admitting: Internal Medicine

## 2015-04-15 ENCOUNTER — Other Ambulatory Visit: Payer: Self-pay

## 2015-05-10 ENCOUNTER — Ambulatory Visit: Payer: 59 | Admitting: Internal Medicine

## 2015-05-17 ENCOUNTER — Ambulatory Visit (INDEPENDENT_AMBULATORY_CARE_PROVIDER_SITE_OTHER): Payer: 59 | Admitting: Internal Medicine

## 2015-05-17 ENCOUNTER — Encounter: Payer: Self-pay | Admitting: Internal Medicine

## 2015-05-17 VITALS — BP 130/88 | HR 78 | Temp 98.4°F | Ht 59.0 in | Wt 232.0 lb

## 2015-05-17 DIAGNOSIS — E119 Type 2 diabetes mellitus without complications: Secondary | ICD-10-CM | POA: Diagnosis not present

## 2015-05-17 DIAGNOSIS — M25562 Pain in left knee: Secondary | ICD-10-CM | POA: Diagnosis not present

## 2015-05-17 DIAGNOSIS — Z0000 Encounter for general adult medical examination without abnormal findings: Secondary | ICD-10-CM

## 2015-05-17 MED ORDER — CELECOXIB 200 MG PO CAPS: 200.0000 mg | ORAL_CAPSULE | Freq: Two times a day (BID) | ORAL | Status: DC

## 2015-05-17 NOTE — Patient Instructions (Signed)
Please take all new medication as prescribed  - the celebrex  Please continue all other medications as before, and refills have been done if requested.  Please have the pharmacy call with any other refills you may need.  Please continue your efforts at being more active, low cholesterol diet, and weight control.  You are otherwise up to date with prevention measures today.  Please keep your appointments with your specialists as you may have planned  Please return in 6 months, or sooner if needed, with Lab testing done 3-5 days before

## 2015-05-17 NOTE — Assessment & Plan Note (Signed)

## 2015-05-17 NOTE — Progress Notes (Signed)
Pre visit review using our clinic review tool, if applicable. No additional management support is needed unless otherwise documented below in the visit note. 

## 2015-05-17 NOTE — Assessment & Plan Note (Signed)
With ongoing knee and lower back pain c/w undelying djd or ddd - for celebrex bid prn,  to f/u any worsening symptoms or concerns

## 2015-05-17 NOTE — Progress Notes (Signed)
Subjective:    Patient ID: [name], female    DOB: [birth year], 59 y.o.   MRN: [id number]  HPI  Here for wellness and f/u;  Overall doing ok;  Pt denies Chest pain, worsening SOB, DOE, wheezing, orthopnea, PND, worsening LE edema, palpitations, dizziness or syncope.  Pt denies neurological change such as new headache, facial or extremity weakness.  Pt denies polydipsia, polyuria, or low sugar symptoms. Pt states overall good compliance with treatment and medications, good tolerability, and has been trying to follow appropriate diet.  Pt denies worsening depressive symptoms, suicidal ideation or panic. No fever, night sweats, wt loss, loss of appetite, or other constitutional symptoms.  Pt states good ability with ADL's, has low fall risk, home safety reviewed and adequate, no other significant changes in hearing or vision, and only occasionally active with exercise. Has bilat knee chronic pain, rec'd for surgury per pt but holding off for now.  Pt continues to have recurring LBP without change in severity, bowel or bladder change, fever, wt loss,  worsening LE pain/numbness/weakness, gait change or falls. Past Medical History  Diagnosis Date  . HYPERLIPIDEMIA 05/11/2008    Pt denies  . OBSTRUCTIVE SLEEP APNEA 05/11/2008  . RASH-NONVESICULAR 06/29/2008  . COLONIC POLYPS, HX OF 05/11/2008  . GENITAL HERPES, HX OF 08/08/2009  . Inverted nipple   . Menorrhagia   . 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 LLQ pain   . Hernia   . Low iron   . H/O gonorrhea   . Bacterial infection   . Trichomonas   . H/O irritable bowel syndrome   . Breast cancer     2012  . Hypertension   . Arthritis   . Abscess of buttock   . Diabetes mellitus without complication   . Allergy   . H/O hiatal hernia   . Anemia   . Dysrhythmia   . Occasional numbness/prickling/tingling of fingers and toes     right foot, right hand  . Shortness of breath dyspnea     with exertion  . Urine frequency   .  Incontinence in female   . Headache     "shooting pains" left side of head MRI done 2016  . Boil of buttock    Past Surgical History  Procedure Laterality Date  . Cesarean section    . Right achilles tendon      and left  . S/p ear surgury    . Right ovarian cyst      hx  . S/p extra uterine fibroid  2006  . S/p left knee replacement  2007  . Dilation and curettage of uterus    . Uterine fibroid surgery  2006  . Breast cyst excision  1973  . Breast lumpectomy    . Axillary lymph node dissection    . Colonoscopy    . Breast lumpectomy with needle localization Right 12/20/2013    Procedure: EXCISION RIGHT BREAST MASS WITH NEEDLE LOCALIZATION;  Surgeon: Stark Klein, MD;  Location: [place];  Service: General;  Laterality: Right;  . Left achilles tendon repair      reports that she has never smoked. She has never used smokeless tobacco. She reports that she drinks alcohol. She reports that she does not use illicit drugs. family history includes Alcohol abuse in her father; Cancer in her maternal aunt; Diabetes in her mother; Heart disease in her father; Hypertension in her mother; Stroke in her mother. There is no history of Colon cancer. Allergies  Allergen Reactions  . Codeine Nausea Only  . Cymbalta [Duloxetine Hcl] Other (See Comments)    Head felt funny, ? thinking not right  . Darvon Nausea Only  . Propoxyphene Hcl Nausea Only   Current Outpatient Prescriptions on File Prior to Visit  Medication Sig Dispense Refill  . calcium carbonate (OS-CAL - DOSED IN MG OF ELEMENTAL CALCIUM) 1250 MG tablet Take 1 tablet by mouth.    . clotrimazole-betamethasone (LOTRISONE) cream Apply topically 2 (two) times daily. 45 g 1  . Efinaconazole (JUBLIA) 10 % SOLN Apply one drop to each affected toenail was daily for 12 months 8 mL 5  . Glucosamine-Vitamin D (GLUCOSAMINE PLUS VITAMIN D PO) Take 1 tablet by mouth 2 (two) times daily.    . hydrocortisone valerate cream (WESTCORT) 0.2 % Apply  topically 2 (two) times daily.  1  . liver oil-zinc oxide (DESITIN) 40 % ointment Apply 1 application topically as needed for irritation.    . Luliconazole (LUZU) 1 % CREA Applied twice daily to the affected area of both feet for 2-4 weeks 60 g 2  . metFORMIN (GLUCOPHAGE-XR) 500 MG 24 hr tablet TAKE 1 TABLET BY MOUTH EVERY DAY WITH BREAKFAST 90 tablet 2  . Naproxen Sodium (ALEVE PO) Take by mouth.    . nystatin-triamcinolone (MYCOLOG II) cream Apply 1 application topically 2 (two) times daily. 30 g 0  . tamoxifen (NOLVADEX) 20 MG tablet Take 1 tablet (20 mg total) by mouth daily. 90 tablet 3  . tamsulosin (FLOMAX) 0.4 MG CAPS capsule     . telmisartan-hydrochlorothiazide (MICARDIS HCT) 80-12.5 MG per tablet TAKE 1 TABLET EVERY DAY 90 tablet 3  . valACYclovir (VALTREX) 1000 MG tablet Take 1,000 mg by mouth daily as needed (herpes labialis).     . [DISCONTINUED] tolterodine (DETROL LA) 4 MG 24 hr capsule Take 4 mg by mouth daily.     No current facility-administered medications on file prior to visit.     Review of Systems Constitutional: Negative for increased diaphoresis, other activity, appetite or siginficant weight change other than noted HENT: Negative for worsening hearing loss, ear pain, facial swelling, mouth sores and neck stiffness.   Eyes: Negative for other worsening pain, redness or visual disturbance.  Respiratory: Negative for shortness of breath and wheezing  Cardiovascular: Negative for chest pain and palpitations.  Gastrointestinal: Negative for diarrhea, blood in stool, abdominal distention or other pain Genitourinary: Negative for hematuria, flank pain or change in urine volume.  Musculoskeletal: Negative for myalgias or other joint complaints.  Skin: Negative for color change and wound or drainage.  Neurological: Negative for syncope and numbness. other than noted Hematological: Negative for adenopathy. or other swelling Psychiatric/Behavioral: Negative for  hallucinations, SI, self-injury, decreased concentration or other worsening agitation.      Objective:   Physical Exam BP 130/88 mmHg  Pulse 78  Temp(Src) 98.4 F (36.9 C) (Oral)  Ht 4\' 11"  (1.499 m)  Wt 232 lb (105.235 kg)  BMI 46.83 kg/m2  SpO2 97% VS noted,  Constitutional: Pt is oriented to person, place, and time. Appears well-developed and well-nourished, in no significant distress Head: Normocephalic and atraumatic.  Right Ear: External ear normal.  Left Ear: External ear normal.  Nose: Nose normal.  Mouth/Throat: Oropharynx is clear and moist.  Eyes: Conjunctivae and EOM are normal. Pupils are equal, round, and reactive to light.  Neck: Normal range of motion. Neck supple. No JVD present. No tracheal deviation present or significant neck LA or mass Cardiovascular: Normal  rate, regular rhythm, normal heart sounds and intact distal pulses.   Pulmonary/Chest: Effort normal and breath sounds without rales or wheezing  Abdominal: Soft. Bowel sounds are normal. NT. No HSM  Musculoskeletal: Normal range of motion. Exhibits no edema.  Lymphadenopathy:  Has no cervical adenopathy.  Neurological: Pt is alert and oriented to person, place, and time. Pt has normal reflexes. No cranial nerve deficit. Motor grossly intact Skin: Skin is warm and dry. No rash noted.  Psychiatric:  Has normal mood and affect. Behavior is normal.  Bilat knees with crepitus, no effusions    Assessment & Plan:

## 2015-05-17 NOTE — Assessment & Plan Note (Signed)
stable overall by history and exam, recent data reviewed with pt, and pt to continue medical treatment as before,  to f/u any worsening symptoms or concerns Lab Results  Component Value Date   HGBA1C 6.6* 10/09/2014    

## 2015-05-21 ENCOUNTER — Encounter: Payer: Self-pay | Admitting: Podiatry

## 2015-05-21 ENCOUNTER — Ambulatory Visit (INDEPENDENT_AMBULATORY_CARE_PROVIDER_SITE_OTHER): Payer: 59 | Admitting: Podiatry

## 2015-05-21 VITALS — BP 149/86 | HR 69 | Resp 12

## 2015-05-21 DIAGNOSIS — M722 Plantar fascial fibromatosis: Secondary | ICD-10-CM

## 2015-05-21 NOTE — Patient Instructions (Signed)
Continue to wear your plantar fasciitis strap over a sock daily until all arch pain and  Plantar Fasciitis Plantar fasciitis is a common condition that causes foot pain. It is soreness (inflammation) of the band of tough fibrous tissue on the bottom of the foot that runs from the heel bone (calcaneus) to the ball of the foot. The cause of this soreness may be from excessive standing, poor fitting shoes, running on hard surfaces, being overweight, having an abnormal walk, or overuse (this is common in runners) of the painful foot or feet. It is also common in aerobic exercise dancers and ballet dancers. SYMPTOMS  Most people with plantar fasciitis complain of:  Severe pain in the morning on the bottom of their foot especially when taking the first steps out of bed. This pain recedes after a few minutes of walking.  Severe pain is experienced also during walking following a long period of inactivity.  Pain is worse when walking barefoot or up stairs DIAGNOSIS   Your caregiver will diagnose this condition by examining and feeling your foot.  Special tests such as X-rays of your foot, are usually not needed. PREVENTION   Consult a sports medicine professional before beginning a new exercise program.  Walking programs offer a good workout. With walking there is a lower chance of overuse injuries common to runners. There is less impact and less jarring of the joints.  Begin all new exercise programs slowly. If problems or pain develop, decrease the amount of time or distance until you are at a comfortable level.  Wear good shoes and replace them regularly.  Stretch your foot and the heel cords at the back of the ankle (Achilles tendon) both before and after exercise.  Run or exercise on even surfaces that are not hard. For example, asphalt is better than pavement.  Do not run barefoot on hard surfaces.  If using a treadmill, vary the incline.  Do not continue to workout if you have foot  or joint problems. Seek professional help if they do not improve. HOME CARE INSTRUCTIONS   Avoid activities that cause you pain until you recover.  Use ice or cold packs on the problem or painful areas after working out.  Only take over-the-counter or prescription medicines for pain, discomfort, or fever as directed by your caregiver.  Soft shoe inserts or athletic shoes with air or gel sole cushions may be helpful.  If problems continue or become more severe, consult a sports medicine caregiver or your own health care provider. Cortisone is a potent anti-inflammatory medication that may be injected into the painful area. You can discuss this treatment with your caregiver. MAKE SURE YOU:   Understand these instructions.  Will watch your condition.  Will get help right away if you are not doing well or get worse. Document Released: 06/30/2001 Document Revised: 12/28/2011 Document Reviewed: 08/29/2008 [facility] Patient Information 2015 [place], [place]. This information is not intended to replace advice given to you by your health care provider. Make sure you discuss any questions you have with your health care provider.

## 2015-05-21 NOTE — Progress Notes (Signed)
   Subjective:    Patient ID: [name], female    DOB: Feb 14, 1956, 59 y.o.   MRN: [id number]  HPI This patient presents today stating that her right foot is feeling better since the visit of 04/09/2015. Patient describes some irritation when she wears the plantar fasciitis strap, however, she does not wear it over a sock around her ankle. She continues to wear athletic style shoes and maintain stretching   Review of Systems  All other systems reviewed and are negative.      Objective:   Physical Exam Orientated 3  Vascular: No peripheral edema noted bilaterally DP and PT pulses 2/4 bilaterally  Neurological: Ankle reflex equal and reactive bilaterally  Dermatological: Well-healed surgical scars posterior tendo Achilles bilaterally  Musculoskeletal: Palpable tenderness lateral fascial band and proximal medial one third of fascial band on the right without any palpable lesions Plantar flexion 5/5 bilaterally       Assessment & Plan:  Assessment assessment: Low-grade plantar fasciitis right  Plan: I review the results of the examination today I demonstrated how to correctly apply the fasciitis strap and water to continue wearing it on a daily basis Maintain stretching and correct shoeing  Reappoint at patient's request

## 2015-07-15 ENCOUNTER — Telehealth: Payer: Self-pay | Admitting: Internal Medicine

## 2015-07-15 NOTE — Telephone Encounter (Signed)
Rec'd from [facility] forward 7 pages to Dr. Jenny Reichmann

## 2015-07-24 ENCOUNTER — Other Ambulatory Visit: Payer: Self-pay | Admitting: Oncology

## 2015-07-24 NOTE — Progress Notes (Unsigned)
Labs from Dr. Alesia Morin office obtained 07/10/2015 show the patient still to be premenopausal with an estradiol level of 35.8 and an Remer of 12.5.

## 2015-09-02 ENCOUNTER — Telehealth: Payer: Self-pay | Admitting: Oncology

## 2015-09-02 ENCOUNTER — Other Ambulatory Visit (HOSPITAL_BASED_OUTPATIENT_CLINIC_OR_DEPARTMENT_OTHER): Payer: 59

## 2015-09-02 ENCOUNTER — Ambulatory Visit (HOSPITAL_BASED_OUTPATIENT_CLINIC_OR_DEPARTMENT_OTHER): Payer: 59 | Admitting: Oncology

## 2015-09-02 VITALS — BP 159/81 | HR 72 | Temp 98.0°F | Resp 18 | Ht 59.0 in | Wt 239.0 lb

## 2015-09-02 DIAGNOSIS — C50912 Malignant neoplasm of unspecified site of left female breast: Secondary | ICD-10-CM

## 2015-09-02 DIAGNOSIS — C50012 Malignant neoplasm of nipple and areola, left female breast: Secondary | ICD-10-CM

## 2015-09-02 DIAGNOSIS — Z170 Estrogen receptor positive status [ER+]: Secondary | ICD-10-CM

## 2015-09-02 DIAGNOSIS — Z79811 Long term (current) use of aromatase inhibitors: Secondary | ICD-10-CM | POA: Diagnosis not present

## 2015-09-02 LAB — CBC WITH DIFFERENTIAL/PLATELET
BASO%: 0.6 % (ref 0.0–2.0)
Basophils Absolute: 0 10*3/uL (ref 0.0–0.1)
EOS%: 1.7 % (ref 0.0–7.0)
Eosinophils Absolute: 0.1 10*3/uL (ref 0.0–0.5)
HCT: 36.1 % (ref 34.8–46.6)
HGB: 11.5 g/dL — ABNORMAL LOW (ref 11.6–15.9)
LYMPH%: 33.1 % (ref 14.0–49.7)
MCH: 27.6 pg (ref 25.1–34.0)
MCHC: 31.9 g/dL (ref 31.5–36.0)
MCV: 86.8 fL (ref 79.5–101.0)
MONO#: 0.4 10*3/uL (ref 0.1–0.9)
MONO%: 7.9 % (ref 0.0–14.0)
NEUT#: 2.7 10*3/uL (ref 1.5–6.5)
NEUT%: 56.7 % (ref 38.4–76.8)
Platelets: 227 10*3/uL (ref 145–400)
RBC: 4.16 10*6/uL (ref 3.70–5.45)
RDW: 15.1 % — ABNORMAL HIGH (ref 11.2–14.5)
WBC: 4.8 10*3/uL (ref 3.9–10.3)
lymph#: 1.6 10*3/uL (ref 0.9–3.3)

## 2015-09-02 LAB — COMPREHENSIVE METABOLIC PANEL (CC13)
ALT: 15 U/L (ref 0–55)
AST: 20 U/L (ref 5–34)
Albumin: 3.4 g/dL — ABNORMAL LOW (ref 3.5–5.0)
Alkaline Phosphatase: 41 U/L (ref 40–150)
Anion Gap: 9 mEq/L (ref 3–11)
BUN: 13 mg/dL (ref 7.0–26.0)
CO2: 25 mEq/L (ref 22–29)
Calcium: 9.5 mg/dL (ref 8.4–10.4)
Chloride: 107 mEq/L (ref 98–109)
Creatinine: 0.8 mg/dL (ref 0.6–1.1)
EGFR: >90 mL/min/{1.73_m2} (ref >90)
Glucose: 95 mg/dl (ref 70–140)
Potassium: 3.9 mEq/L (ref 3.5–5.1)
Sodium: 141 mEq/L (ref 136–145)
Total Bilirubin: 0.43 mg/dL (ref 0.20–1.20)
Total Protein: 8 g/dL (ref 6.4–8.3)

## 2015-09-02 LAB — LUTEINIZING HORMONE: LH: 6.8 m[IU]/mL

## 2015-09-02 LAB — FOLLICLE STIMULATING HORMONE: FSH: 11.7 m[IU]/mL

## 2015-09-02 MED ORDER — TAMOXIFEN CITRATE 20 MG PO TABS
20.0000 mg | ORAL_TABLET | Freq: Every day | ORAL | Status: AC
Start: 2015-09-02 — End: 2015-10-02

## 2015-09-02 NOTE — Progress Notes (Signed)
ID: [name]   DOB: 05/19/1956  MR#: [id number]  QVZ#:[id number]   PCP: Cathlean Cower, MD GYN:  Kendall Flack, MD SU:  Excell Seltzer, MD Other:  Thea Silversmith, MD;  Jari Pigg, MD;  Renato Shin, MD;  Lowella Bandy, MD;  Erskine Emery, MD  CHIEF COMPLAINT:  Left Breast Cancer  TREATMENT: Observation  HISTORY OF PRESENT ILLNESS: From the original intake note:  The patient had screening mammography in January 16, 2011 (she missed her mammogram on January 05, 2010) and this showed some changes in the left breast, which measured 1.1 cm.  Left breast ultrasound was obtained the same day and confirmed a small hypoechoic lobular area of nodularity measuring again 1.1 cm.  There was no other abnormality and the patient was set up for ultrasound-guided needle biopsy January 22, 2011.  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  Tumor was estrogen receptor positive at 82%, progesterone receptor positive at 92% with MIB-1 of 17% and no evidence of HER2 amplification, the ratio by CISH being 0.97.  With this information the patient was set up for bilateral breast MRI s and these were obtained January 29, 2011.  In the central left breast in the retroareolar location there was a spiculated enhancing mass with a regular margins measuring again 1.1 cm.  There were no other areas of abnormality.  She had her left lumpectomy and sentinel lymph node sampling on February 10, 2011.  The pathology report ([id number]) shows an 8-mm invasive ductal carcinoma, grade 1, involving 1/2 sentinel lymph nodes with a micrometastatic deposit.  The inferior margin was apparently broadly positive initially, but may have been cleared with subsequent excision in the same procedure.  The pathology report does not commit, but says the margin has been "likely cleared."   Her Oncotype DX gave her a recurrence score of 21%, predicting a risk of recurrent for node  positive patients in the 12% range.  She decided to forego chemotherapy, but proceded with radiation therapy. She completed radiation in August 2012, and started on tamoxifen the first week of September 2012.   Her subsequent history is as detailed below.  INTERVAL HISTORY: Devin returns today for follow up of her left breast cancer. After her last visit here we refilled her tamoxifen and she says she took it for about 2 months on that side. She actually did not have any side effects to report. She was obtaining it at $7 per month.  REVIEW OF SYSTEMS: Aiyanna has quite a bit of pain involving both knees and her hips. She takes Aleve or Motrin intermittently for this. She also has rashes chiefly in her abdomen and sometimes in her lower back. She is followed closely by [facility] and currently is on 4 or 5 different creams that she uses intermittently. She recently saw Dr. [name] for some erythema of her left breast. That has resolved. She tells me she is strongly contemplating bariatric surgery, but never turned in her papers or show up for the 6 month follow-up. All this will have to be restarted next year. She says she did do the [employer] and enjoyed. She does feel short of breath with minimal activity, has some stress urinary incontinence, and is a little depressed, she is his. A detailed review of systems today was otherwise stable.  PAST MEDICAL HISTORY: Past Medical History  Diagnosis Date  . HYPERLIPIDEMIA 05/11/2008    Pt denies  . OBSTRUCTIVE  SLEEP APNEA 05/11/2008  . RASH-NONVESICULAR 06/29/2008  . COLONIC POLYPS, HX OF 05/11/2008  . GENITAL HERPES, HX OF 08/08/2009  . Inverted nipple   . Menorrhagia   . 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 LLQ pain   . Hernia   . Low iron   . H/O gonorrhea   . Bacterial infection   . Trichomonas   . H/O irritable bowel syndrome   . Breast cancer     2012  . Hypertension   . Arthritis   . Abscess of buttock   . Diabetes  mellitus without complication   . Allergy   . H/O hiatal hernia   . Anemia   . Dysrhythmia   . Occasional numbness/prickling/tingling of fingers and toes     right foot, right hand  . Shortness of breath dyspnea     with exertion  . Urine frequency   . Incontinence in female   . Headache     "shooting pains" left side of head MRI done 2016  . Boil of buttock   Is significant for hypertension, obesity, early glaucoma, removal of a right breast cysts in 1972, total left knee replacement in 2007, possible irritable bowel syndrome, history of fibroid uterus surgery in 2006, history of C-section in 1981 and history of remote repair of the right and left Achilles tendons.  PAST SURGICAL HISTORY: Past Surgical History  Procedure Laterality Date  . Cesarean section    . Right achilles tendon      and left  . S/p ear surgury    . Right ovarian cyst      hx  . S/p extra uterine fibroid  2006  . S/p left knee replacement  2007  . Dilation and curettage of uterus    . Uterine fibroid surgery  2006  . Breast cyst excision  1973  . Breast lumpectomy    . Axillary lymph node dissection    . Colonoscopy    . Breast lumpectomy with needle localization Right 12/20/2013    Procedure: EXCISION RIGHT BREAST MASS WITH NEEDLE LOCALIZATION;  Surgeon: Stark Klein, MD;  Location: [facility];  Service: General;  Laterality: Right;  . Left achilles tendon repair      FAMILY HISTORY Family History  Problem Relation Age of Onset  . Diabetes Mother   . Hypertension Mother   . Stroke Mother   . Heart disease Father     COPD  . Alcohol abuse Father     ETOH dependence  . Colon cancer Neg Hx   . Cancer Maternal Aunt     breast  The patient's mother is alive..  The patient's father died in his early 21s from congestive heart failure.  The patient had five brothers and three sisters; most of theses siblings are half siblings, but in any case there is no history of breast or ovarian cancer in the immediate  family.  There was one maternal aunt (out of a total of four) diagnosed with breast cancer in her 74s.  GYNECOLOGIC HISTORY: The patient had menarche age 59, she is still having regular periods.  She is Gx, P1, first pregnancy to term at age 42.  She stopped having menstrual periods August of 2012, but had one anomalous. In April of 2013.  SOCIAL HISTORY: Adaliah works as a Education officer, museum for [employer]  She has been divorced more than 10 years and lives by herself at home with no pets.  Her one child, a son, died at age  13.   ADVANCED DIRECTIVES: not in place  HEALTH MAINTENANCE: (Updated 09/04/2013) Social History  Substance Use Topics  . Smoking status: Never Smoker   . Smokeless tobacco: Never Used  . Alcohol Use: Yes     Comment: occasionally drinks wine     Colonoscopy: May 2013, Dr. Deatra Ina  PAP: UTD/Dr. Leo Grosser  Bone density: Not on file  Lipid panel: April 2014, Dr. Jenny Reichmann   Allergies  Allergen Reactions  . Codeine Nausea Only  . Cymbalta [Duloxetine Hcl] Other (See Comments)    Head felt funny, ? thinking not right  . Darvon Nausea Only  . Propoxyphene Hcl Nausea Only    Current Outpatient Prescriptions  Medication Sig Dispense Refill  . calcium carbonate (OS-CAL - DOSED IN MG OF ELEMENTAL CALCIUM) 1250 MG tablet Take 1 tablet by mouth.    . clotrimazole-betamethasone (LOTRISONE) cream Apply topically 2 (two) times daily. 45 g 1  . Efinaconazole (JUBLIA) 10 % SOLN Apply one drop to each affected toenail was daily for 12 months 8 mL 5  . Glucosamine-Vitamin D (GLUCOSAMINE PLUS VITAMIN D PO) Take 1 tablet by mouth 2 (two) times daily.    . hydrocortisone valerate cream (WESTCORT) 0.2 % Apply topically 2 (two) times daily.  1  . liver oil-zinc oxide (DESITIN) 40 % ointment Apply 1 application topically as needed for irritation.    . Luliconazole (LUZU) 1 % CREA Applied twice daily to the affected area of both feet for 2-4 weeks 60 g 2  . metFORMIN (GLUCOPHAGE-XR) 500  MG 24 hr tablet TAKE 1 TABLET BY MOUTH EVERY DAY WITH BREAKFAST 90 tablet 2  . nystatin-triamcinolone (MYCOLOG II) cream Apply 1 application topically 2 (two) times daily. 30 g 0  . telmisartan-hydrochlorothiazide (MICARDIS HCT) 80-12.5 MG per tablet TAKE 1 TABLET EVERY DAY 90 tablet 3  . [DISCONTINUED] tolterodine (DETROL LA) 4 MG 24 hr capsule Take 4 mg by mouth daily.     No current facility-administered medications for this visit.    OBJECTIVE: Middle-aged [place] American woman who appears stated age 18 Vitals:   09/02/15 0930  BP: 159/81  Pulse: 72  Temp: 98 F (36.7 C)  Resp: 18     Body mass index is 48.25 kg/(m^2).    ECOG FS: 1 Filed Weights   09/02/15 0930  Weight: 239 lb (108.41 kg)   Sclerae unicteric, EOMs intact Oropharynx clear, no thrush or other lesions obese, No cervical or supraclavicular adenopathy Lungs no rales or rhonchi Heart regular rate and rhythm Abd soft, nontender, positive bowel sounds MSK scoliosis but no focal spinal tenderness, no upper extremity lymphedema Neuro: nonfocal, well oriented, appropriate affect Breasts: The right breast is unremarkable. The left breast is status post lumpectomy and radiation. There is no erythema. There is no evidence of local recurrence. The left axilla is benign.   LAB RESULTS: Lab Results  Component Value Date   WBC 4.8 09/02/2015   NEUTROABS 2.7 09/02/2015   HGB 11.5* 09/02/2015   HCT 36.1 09/02/2015   MCV 86.8 09/02/2015   PLT 227 09/02/2015      Chemistry      Component Value Date/Time   NA 141 02/25/2015 0838   NA 137 01/16/2015 1025   K 3.6 02/25/2015 0838   K 3.4* 01/16/2015 1025   CL 103 01/16/2015 1025   CL 106 01/18/2013 0909   CO2 24 02/25/2015 0838   CO2 24 01/16/2015 1025   BUN 15.9 02/25/2015 0838   BUN 12 01/16/2015 1025  CREATININE 0.7 02/25/2015 0838   CREATININE 0.72 01/16/2015 1025      Component Value Date/Time   CALCIUM 9.0 02/25/2015 0838   CALCIUM 8.9 01/16/2015  1025   ALKPHOS 38* 02/25/2015 0838   ALKPHOS 44 10/09/2014 1602   AST 15 02/25/2015 0838   AST 26 10/09/2014 1602   ALT 15 02/25/2015 0838   ALT 26 10/09/2014 1602   BILITOT 0.29 02/25/2015 0838   BILITOT 0.3 10/09/2014 1602     Estradiol level on 07/10/2015 was 35.8, consistent with the follicular phase of menstruation.   STUDIES: Mammography at [facility]/13/2015 showed no mammographic evidence of malignancy. The breast density was category B.  ASSESSMENT: 59 y.o.  [place] woman   (1)  status post left lumpectomy and sentinel lymph node dissection April of 2012 for a T1b N1(mic) stage IB invasive ductal carcinoma, grade 1, estrogen receptor 82% and progesterone receptor 92% positive, with no HER-2 amplification, and an MIB-1-1 of 17%,   (2)  The patient's Oncotype DX score of 21 predicts a 13% risk of distant recurrence after 5 years of tamoxifen.  (3)  status post radiation completed August of 2012,   (4)  on tamoxifen from September of 2012 to April 2014  (5) the plan had been to initiate anastrozole in April 2014, but the patient had a menstrual cycle in May 2014, and resumed tamoxifen.  (a) discontinued tamoxifen on her own initiative June 2015 because of "aches and pains".  (b) resumed tamoxifen December 2015, discontinued February 2016 at patient's discretion  (6) obesity: s/p [employer] program; considering bariattric surgery   PLAN: We discussed [place] use of tamoxifen which has been very intermittent. She understands if she took tamoxifen for 5 years and would essentially cut her risk of recurrence in half. It is not clear to me how much he has actually taken since she completed radiation in August 2012. Perhaps 2-1/2-3 years altogether.  She tells me she would like to go back on tamoxifen and try to make that up. I think that would be a good plan is for [place] her breast cancer is concerned. She generally tolerates it well and obtains it at a good price. I went ahead  and place the order.  I strongly encouraged her again to consider bariatric surgery. I don't think she is can make any improvement on her knees and other arthralgias as well as her general sense of well-being until she gets her weight under control. She was very tearful regarding this but understands what is going on and tells me she is going to be following through with Dr [name] regarding this.  Finally I suggested she consider water aerobics. I don't think that would hurt her knees at all and it would get her going on an exercise program that she can intensify once she completes her surgery if she does indeed go through with it as planned.[name]   Chauncey Cruel, MD    09/02/2015

## 2015-09-02 NOTE — Telephone Encounter (Signed)
Appointments made and avs printed for patient °

## 2015-09-05 LAB — ESTRADIOL, ULTRA SENS: Estradiol, Ultra Sensitive: 27 pg/mL

## 2015-11-01 ENCOUNTER — Other Ambulatory Visit (HOSPITAL_COMMUNITY): Payer: Self-pay | Admitting: Orthopedic Surgery

## 2015-11-01 DIAGNOSIS — M25562 Pain in left knee: Secondary | ICD-10-CM

## 2015-11-06 ENCOUNTER — Encounter (HOSPITAL_COMMUNITY): Payer: 59

## 2015-11-12 ENCOUNTER — Encounter (HOSPITAL_COMMUNITY)
Admission: RE | Admit: 2015-11-12 | Discharge: 2015-11-12 | Disposition: A | Discharge status: Home / Self Care | Payer: 59 | Source: Ambulatory Visit | Attending: Orthopedic Surgery | Admitting: Orthopedic Surgery

## 2015-11-12 DIAGNOSIS — M25562 Pain in left knee: Secondary | ICD-10-CM | POA: Insufficient documentation

## 2015-11-12 MED ORDER — TECHNETIUM TC 99M MEDRONATE IV KIT
25.3000 | PACK | Freq: Once | INTRAVENOUS | Status: AC | PRN
  Administered 2015-11-12: 25.3 via INTRAVENOUS

## 2015-11-19 ENCOUNTER — Ambulatory Visit: Payer: 59 | Admitting: Internal Medicine

## 2015-12-23 ENCOUNTER — Other Ambulatory Visit: Payer: Self-pay | Admitting: Internal Medicine

## 2016-01-01 ENCOUNTER — Other Ambulatory Visit: Payer: Self-pay | Admitting: Sports Medicine

## 2016-01-01 DIAGNOSIS — G8929 Other chronic pain: Secondary | ICD-10-CM

## 2016-01-01 DIAGNOSIS — M545 Low back pain: Principal | ICD-10-CM

## 2016-01-02 ENCOUNTER — Ambulatory Visit
Admission: RE | Admit: 2016-01-02 | Discharge: 2016-01-02 | Disposition: A | Discharge status: Home / Self Care | Payer: 59 | Source: Ambulatory Visit | Attending: Sports Medicine | Admitting: Sports Medicine

## 2016-01-02 DIAGNOSIS — M545 Low back pain: Principal | ICD-10-CM

## 2016-01-02 DIAGNOSIS — G8929 Other chronic pain: Secondary | ICD-10-CM

## 2016-01-18 ENCOUNTER — Other Ambulatory Visit: Payer: Self-pay | Admitting: Internal Medicine

## 2016-01-30 ENCOUNTER — Ambulatory Visit (INDEPENDENT_AMBULATORY_CARE_PROVIDER_SITE_OTHER): Payer: 59 | Admitting: Internal Medicine

## 2016-01-30 ENCOUNTER — Encounter (HOSPITAL_COMMUNITY): Payer: Self-pay | Admitting: *Deleted

## 2016-01-30 ENCOUNTER — Emergency Department (HOSPITAL_COMMUNITY)
Admission: EM | Admit: 2016-01-30 | Discharge: 2016-01-31 | Disposition: A | Discharge status: Home / Self Care | Payer: 59 | Attending: Emergency Medicine | Admitting: Emergency Medicine

## 2016-01-30 ENCOUNTER — Encounter: Payer: Self-pay | Admitting: Internal Medicine

## 2016-01-30 ENCOUNTER — Emergency Department (HOSPITAL_COMMUNITY): Payer: 59

## 2016-01-30 VITALS — BP 140/90 | HR 70 | Temp 98.3°F | Ht 59.0 in | Wt 239.0 lb

## 2016-01-30 DIAGNOSIS — Z7984 Long term (current) use of oral hypoglycemic drugs: Secondary | ICD-10-CM | POA: Diagnosis not present

## 2016-01-30 DIAGNOSIS — Z86010 Personal history of colonic polyps: Secondary | ICD-10-CM | POA: Insufficient documentation

## 2016-01-30 DIAGNOSIS — K219 Gastro-esophageal reflux disease without esophagitis: Secondary | ICD-10-CM | POA: Diagnosis not present

## 2016-01-30 DIAGNOSIS — Z79899 Other long term (current) drug therapy: Secondary | ICD-10-CM | POA: Insufficient documentation

## 2016-01-30 DIAGNOSIS — Z872 Personal history of diseases of the skin and subcutaneous tissue: Secondary | ICD-10-CM | POA: Insufficient documentation

## 2016-01-30 DIAGNOSIS — Z8742 Personal history of other diseases of the female genital tract: Secondary | ICD-10-CM | POA: Diagnosis not present

## 2016-01-30 DIAGNOSIS — Z7952 Long term (current) use of systemic steroids: Secondary | ICD-10-CM | POA: Diagnosis not present

## 2016-01-30 DIAGNOSIS — Z862 Personal history of diseases of the blood and blood-forming organs and certain disorders involving the immune mechanism: Secondary | ICD-10-CM | POA: Insufficient documentation

## 2016-01-30 DIAGNOSIS — E119 Type 2 diabetes mellitus without complications: Secondary | ICD-10-CM | POA: Insufficient documentation

## 2016-01-30 DIAGNOSIS — Z8669 Personal history of other diseases of the nervous system and sense organs: Secondary | ICD-10-CM | POA: Diagnosis not present

## 2016-01-30 DIAGNOSIS — R1013 Epigastric pain: Secondary | ICD-10-CM

## 2016-01-30 DIAGNOSIS — Z87448 Personal history of other diseases of urinary system: Secondary | ICD-10-CM | POA: Diagnosis not present

## 2016-01-30 DIAGNOSIS — I10 Essential (primary) hypertension: Secondary | ICD-10-CM | POA: Diagnosis not present

## 2016-01-30 DIAGNOSIS — Z8739 Personal history of other diseases of the musculoskeletal system and connective tissue: Secondary | ICD-10-CM | POA: Diagnosis not present

## 2016-01-30 DIAGNOSIS — Z8619 Personal history of other infectious and parasitic diseases: Secondary | ICD-10-CM | POA: Insufficient documentation

## 2016-01-30 DIAGNOSIS — K439 Ventral hernia without obstruction or gangrene: Secondary | ICD-10-CM | POA: Insufficient documentation

## 2016-01-30 DIAGNOSIS — R1010 Upper abdominal pain, unspecified: Secondary | ICD-10-CM | POA: Diagnosis present

## 2016-01-30 LAB — COMPREHENSIVE METABOLIC PANEL
ALT: 19 U/L (ref 14–54)
AST: 19 U/L (ref 15–41)
Albumin: 3.9 g/dL (ref 3.5–5.0)
Alkaline Phosphatase: 41 U/L (ref 38–126)
Anion gap: 9 (ref 5–15)
BUN: 23 mg/dL — ABNORMAL HIGH (ref 6–20)
CO2: 26 mmol/L (ref 22–32)
Calcium: 9.3 mg/dL (ref 8.9–10.3)
Chloride: 104 mmol/L (ref 101–111)
Creatinine, Ser: 0.84 mg/dL (ref 0.44–1.00)
GFR calc Af Amer: >60 mL/min (ref >60)
GFR calc non Af Amer: >60 mL/min (ref >60)
Glucose, Bld: 107 mg/dL — ABNORMAL HIGH (ref 65–99)
Potassium: 3.6 mmol/L (ref 3.5–5.1)
Sodium: 139 mmol/L (ref 135–145)
Total Bilirubin: 0.5 mg/dL (ref 0.3–1.2)
Total Protein: 8.2 g/dL — ABNORMAL HIGH (ref 6.5–8.1)

## 2016-01-30 LAB — URINALYSIS, ROUTINE W REFLEX MICROSCOPIC
Bilirubin Urine: NEGATIVE
Glucose, UA: NEGATIVE mg/dL
Ketones, ur: NEGATIVE mg/dL
Leukocytes, UA: NEGATIVE
Nitrite: NEGATIVE
Protein, ur: NEGATIVE mg/dL
Specific Gravity, Urine: 1.024 (ref 1.005–1.030)
pH: 5 (ref 5.0–8.0)

## 2016-01-30 LAB — LIPASE, BLOOD: Lipase: 22 U/L (ref 11–51)

## 2016-01-30 LAB — URINE MICROSCOPIC-ADD ON

## 2016-01-30 LAB — CBC
HCT: 38.8 % (ref 36.0–46.0)
Hemoglobin: 12.5 g/dL (ref 12.0–15.0)
MCH: 27.1 pg (ref 26.0–34.0)
MCHC: 32.2 g/dL (ref 30.0–36.0)
MCV: 84.2 fL (ref 78.0–100.0)
Platelets: 268 10*3/uL (ref 150–400)
RBC: 4.61 MIL/uL (ref 3.87–5.11)
RDW: 14.3 % (ref 11.5–15.5)
WBC: 7 10*3/uL (ref 4.0–10.5)

## 2016-01-30 MED ORDER — SODIUM CHLORIDE 0.9 % IV SOLN
INTRAVENOUS | Status: DC
  Administered 2016-01-30: 10 mL/h via INTRAVENOUS

## 2016-01-30 MED ORDER — DIATRIZOATE MEGLUMINE & SODIUM 66-10 % PO SOLN
15.0000 mL | Freq: Once | ORAL | Status: AC
Start: 2016-01-30 — End: 2016-01-30
  Administered 2016-01-30: 15 mL via ORAL

## 2016-01-30 MED ORDER — IOPAMIDOL (ISOVUE-300) INJECTION 61%
100.0000 mL | Freq: Once | INTRAVENOUS | Status: AC | PRN
  Administered 2016-01-30: 100 mL via INTRAVENOUS

## 2016-01-30 MED ORDER — ONDANSETRON HCL 4 MG/2ML IJ SOLN
4.0000 mg | Freq: Once | INTRAMUSCULAR | Status: DC
  Filled 2016-01-30: qty 2

## 2016-01-30 NOTE — Progress Notes (Signed)
Subjective:    Patient ID: [name], female    DOB: February 09, 1956, 60 y.o.   MRN: [id number]  HPI  Here with abd pain, has been ongoing for years to the epigastrium, but in the last 3 mo particularly bothersome in severity, always seemed to be intermittent, mild and tolerable, but now in last 2 days more severe and constant, and assoc with a bulge/knot under the skin that is tender, seems to "come out" more with standing, then less with lying down.  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 Denies worsening reflux, dysphagia, n/v, bowel change or blood, and she is wondering if might be her hiatal hernia.  Area of left ribs near the center has been tender as well.  Earlier also with an episode of marked crampy muscle spasms. Pt denies chest pain, increased sob or doe, wheezing, orthopnea, PND, increased LE swelling, palpitations, dizziness or syncope.   Pt denies polydipsia, polyuria  Past Medical History  Diagnosis Date  . HYPERLIPIDEMIA 05/11/2008    Pt denies  . OBSTRUCTIVE SLEEP APNEA 05/11/2008  . RASH-NONVESICULAR 06/29/2008  . COLONIC POLYPS, HX OF 05/11/2008  . GENITAL HERPES, HX OF 08/08/2009  . Inverted nipple   . Menorrhagia   . 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 LLQ pain   . Hernia   . Low iron   . H/O gonorrhea   . Bacterial infection   . Trichomonas   . H/O irritable bowel syndrome   . Breast cancer ([place])     2012  . Hypertension   . Arthritis   . Abscess of buttock   . Diabetes mellitus without complication ([place])   . Allergy   . H/O hiatal hernia   . Anemia   . Dysrhythmia   . Occasional numbness/prickling/tingling of fingers and toes     right foot, right hand  . Shortness of breath dyspnea     with exertion  . Urine frequency   . Incontinence in female   . Headache     "shooting pains" left side of head MRI done 2016  . Boil of buttock    Past Surgical History  Procedure Laterality Date  . Cesarean  section    . Right achilles tendon      and left  . S/p ear surgury    . Right ovarian cyst      hx  . S/p extra uterine fibroid  2006  . S/p left knee replacement  2007  . Dilation and curettage of uterus    . Uterine fibroid surgery  2006  . Breast cyst excision  1973  . Breast lumpectomy    . Axillary lymph node dissection    . Colonoscopy    . Breast lumpectomy with needle localization Right 12/20/2013    Procedure: EXCISION RIGHT BREAST MASS WITH NEEDLE LOCALIZATION;  Surgeon: Stark Klein, MD;  Location: [facility];  Service: General;  Laterality: Right;  . Left achilles tendon repair      reports that she has never smoked. She has never used smokeless tobacco. She reports that she drinks alcohol. She reports that she does not use illicit drugs. family history includes Alcohol abuse in her father; Cancer in her maternal aunt; Diabetes in her mother; Heart disease in her father; Hypertension in her mother; Stroke in her mother. There is no history of Colon cancer. Allergies  Allergen Reactions  . Codeine Nausea Only  . Cymbalta [Duloxetine  Hcl] Other (See Comments)    Head felt funny, ? thinking not right  . Darvon Nausea Only  . Propoxyphene Hcl Nausea Only   Current Outpatient Prescriptions on File Prior to Visit  Medication Sig Dispense Refill  . calcium carbonate (OS-CAL - DOSED IN MG OF ELEMENTAL CALCIUM) 1250 MG tablet Take 1 tablet by mouth.    . clotrimazole-betamethasone (LOTRISONE) cream Apply topically 2 (two) times daily. 45 g 1  . Efinaconazole (JUBLIA) 10 % SOLN Apply one drop to each affected toenail was daily for 12 months 8 mL 5  . Glucosamine-Vitamin D (GLUCOSAMINE PLUS VITAMIN D PO) Take 1 tablet by mouth 2 (two) times daily.    . hydrocortisone valerate cream (WESTCORT) 0.2 % Apply topically 2 (two) times daily.  1  . liver oil-zinc oxide (DESITIN) 40 % ointment Apply 1 application topically as needed for irritation.    . Luliconazole (LUZU) 1 % CREA Applied  twice daily to the affected area of both feet for 2-4 weeks 60 g 2  . metFORMIN (GLUCOPHAGE-XR) 500 MG 24 hr tablet TAKE 1 TABLET BY MOUTH EVERY DAY WITH BREAKFAST 90 tablet 1  . nystatin-triamcinolone (MYCOLOG II) cream Apply 1 application topically 2 (two) times daily. 30 g 0  . telmisartan-hydrochlorothiazide (MICARDIS HCT) 80-12.5 MG tablet TAKE 1 TABLET EVERY DAY 90 tablet 2  . [DISCONTINUED] tolterodine (DETROL LA) 4 MG 24 hr capsule Take 4 mg by mouth daily.     No current facility-administered medications on file prior to visit.   Review of Systems  Constitutional: Negative for unusual diaphoresis or night sweats HENT: Negative for ear swelling or discharge Eyes: Negative for worsening visual haziness  Respiratory: Negative for choking and stridor.   Gastrointestinal: Negative for distension or worsening eructation Genitourinary: Negative for retention or change in urine volume.  Musculoskeletal: Negative for other MSK pain or swelling Skin: Negative for color change and worsening wound Neurological: Negative for tremors and numbness other than noted  Psychiatric/Behavioral: Negative for decreased concentration or agitation other than above       Objective:   Physical Exam BP 140/90 mmHg  Pulse 70  Temp(Src) 98.3 F (36.8 C) (Oral)  Ht 4\' 11"  (1.499 m)  Wt 239 lb (108.41 kg)  BMI 48.25 kg/m2  SpO2 95% VS noted, morbid obese Constitutional: Pt appears in no apparent distress HENT: Head: NCAT.  Right Ear: External ear normal.  Left Ear: External ear normal.  Eyes: . Pupils are equal, round, and reactive to light. Conjunctivae and EOM are normal Neck: Normal range of motion. Neck supple.  Cardiovascular: Normal rate and regular rhythm.   Pulmonary/Chest: Effort normal and breath sounds without rales or wheezing.  + tender to xyphoid and left costal margin Abd:  Soft, ND, + BS with somewhat subtle "knot" mass palpated just below the xyphoid process area, smaller with  lying and likely somewhat bigger with standing Neurological: Pt is alert. Not confused , motor grossly intact Skin: Skin is warm. No rash, no LE edema Psychiatric: Pt behavior is normal. No agitation.      Assessment & Plan:

## 2016-01-30 NOTE — Discharge Instructions (Signed)
Ventral Hernia A ventral hernia (also called an incisional hernia) is a hernia that occurs at the site of a previous surgical cut (incision) in the abdomen. The abdominal wall spans from your lower chest down to your pelvis. If the abdominal wall is weakened from a surgical incision, a hernia can occur. A hernia is a bulge of bowel or muscle tissue pushing out on the weakened part of the abdominal wall. Ventral hernias can get bigger from straining or lifting. Obese and older people are at higher risk for a ventral hernia. People who develop infections after surgery or require repeat incisions at the same site on the abdomen are also at increased risk. CAUSES  A ventral hernia occurs because of weakness in the abdominal wall at an incision site.  SYMPTOMS  Common symptoms include:  A visible bulge or lump on the abdominal wall.  Pain or tenderness around the lump.  Increased discomfort if you cough or make a sudden movement. If the hernia has blocked part of the intestine, a serious complication can occur (incarcerated or strangulated hernia). This can become a problem that requires emergency surgery because the blood flow to the blocked intestine may be cut off. Symptoms may include:  Feeling sick to your stomach (nauseous).  Throwing up (vomiting).  Stomach swelling (distention) or bloating.  Fever.  Rapid heartbeat. DIAGNOSIS  Your health care provider will take a medical history and perform a physical exam. Various tests may be ordered, such as:  Blood tests.  Urine tests.  Ultrasonography.  X-rays.  Computed tomography (CT). TREATMENT  Watchful waiting may be all that is needed for a smaller hernia that does not cause symptoms. Your health care provider may recommend the use of a supportive belt (truss) that helps to keep the abdominal wall intact. For larger hernias or those that cause pain, surgery to repair the hernia is usually recommended. If a hernia becomes  strangulated, emergency surgery needs to be done right away. HOME CARE INSTRUCTIONS  Avoid putting pressure or strain on the abdominal area.  Avoid heavy lifting.  Use good body positioning for physical tasks. Ask your health care provider about proper body positioning.  Use a supportive belt as directed by your health care provider.  Maintain a healthy weight.  Eat foods that are high in fiber, such as whole grains, fruits, and vegetables. Fiber helps prevent difficult bowel movements (constipation).  Drink enough fluids to keep your urine clear or pale yellow.  Follow up with your health care provider as directed. SEEK MEDICAL CARE IF:   Your hernia seems to be getting larger or more painful. SEEK IMMEDIATE MEDICAL CARE IF:   You have abdominal pain that is sudden and sharp.  Your pain becomes severe.  You have repeated vomiting.  You are sweating a lot.  You notice a rapid heartbeat.  You develop a fever. MAKE SURE YOU:   Understand these instructions.  Will watch your condition.  Will get help right away if you are not doing well or get worse.   This information is not intended to replace advice given to you by your health care provider. Make sure you discuss any questions you have with your health care provider.   Document Released: 09/21/2012 Document Revised: 10/26/2014 Document Reviewed: 09/21/2012 Elsevier Interactive Patient Education 2016 [employer]  

## 2016-01-30 NOTE — ED Provider Notes (Signed)
CSN: [id number]     Arrival date & time 01/30/16  1840 History   First MD Initiated Contact with Patient 01/30/16 2008     Chief Complaint  Patient presents with  . Abdominal Pain     (Consider location/radiation/quality/duration/timing/severity/associated sxs/prior Treatment) HPI Comments: Patient here complaining of upper abdominal pain times several days with one episode of nonbilious emesis. Saw her doctor today and was sent here for evaluation of possible incarcerated ventral wall hernia. States that she does note a bulge in her midabdomen that is worse when she stands. Pain is better when she is lying flat. Denies any diarrhea or bloody stools. Symptoms persistent.  Patient is a 60 y.o. female presenting with abdominal pain. The history is provided by the patient.  Abdominal Pain   Past Medical History  Diagnosis Date  . HYPERLIPIDEMIA 05/11/2008    Pt denies  . OBSTRUCTIVE SLEEP APNEA 05/11/2008  . RASH-NONVESICULAR 06/29/2008  . COLONIC POLYPS, HX OF 05/11/2008  . GENITAL HERPES, HX OF 08/08/2009  . Inverted nipple   . Menorrhagia   . 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 LLQ pain   . Hernia   . Low iron   . H/O gonorrhea   . Bacterial infection   . Trichomonas   . H/O irritable bowel syndrome   . Breast cancer ([place])     2012  . Hypertension   . Arthritis   . Abscess of buttock   . Diabetes mellitus without complication ([place])   . Allergy   . H/O hiatal hernia   . Anemia   . Dysrhythmia   . Occasional numbness/prickling/tingling of fingers and toes     right foot, right hand  . Shortness of breath dyspnea     with exertion  . Urine frequency   . Incontinence in female   . Headache     "shooting pains" left side of head MRI done 2016  . Boil of buttock    Past Surgical History  Procedure Laterality Date  . Cesarean section    . Right achilles tendon      and left  . S/p ear surgury    . Right ovarian cyst      hx  . S/p extra uterine fibroid  2006  .  S/p left knee replacement  2007  . Dilation and curettage of uterus    . Uterine fibroid surgery  2006  . Breast cyst excision  1973  . Breast lumpectomy    . Axillary lymph node dissection    . Colonoscopy    . Breast lumpectomy with needle localization Right 12/20/2013    Procedure: EXCISION RIGHT BREAST MASS WITH NEEDLE LOCALIZATION;  Surgeon: Stark Klein, MD;  Location: [facility];  Service: General;  Laterality: Right;  . Left achilles tendon repair     Family History  Problem Relation Age of Onset  . Diabetes Mother   . Hypertension Mother   . Stroke Mother   . Heart disease Father     COPD  . Alcohol abuse Father     ETOH dependence  . Colon cancer Neg Hx   . Cancer Maternal Aunt     breast   Social History  Substance Use Topics  . Smoking status: Never Smoker   . Smokeless tobacco: Never Used  . Alcohol Use: Yes     Comment: occasionally drinks wine   OB History    No data available     Review of Systems  Gastrointestinal: Positive for  abdominal pain.  All other systems reviewed and are negative.     Allergies  Codeine; Cymbalta; Darvon; and Propoxyphene hcl  Home Medications   Prior to Admission medications   Medication Sig Start Date End Date Taking? Authorizing Provider  calcium carbonate (OS-CAL - DOSED IN MG OF ELEMENTAL CALCIUM) 1250 MG tablet Take 1 tablet by mouth.    Historical Provider, MD  clotrimazole-betamethasone (LOTRISONE) cream Apply topically 2 (two) times daily. 09/04/13   Amy Milda Smart, PA-C  Efinaconazole (JUBLIA) 10 % SOLN Apply one drop to each affected toenail was daily for 12 months 06/15/14   Harriet Masson, DPM  Glucosamine-Vitamin D (GLUCOSAMINE PLUS VITAMIN D PO) Take 1 tablet by mouth 2 (two) times daily.    Historical Provider, MD  hydrocortisone valerate cream (WESTCORT) 0.2 % Apply topically 2 (two) times daily. 03/24/15   Historical Provider, MD  liver oil-zinc oxide (DESITIN) 40 % ointment Apply 1 application topically as needed  for irritation.    Historical Provider, MD  Luliconazole (LUZU) 1 % CREA Applied twice daily to the affected area of both feet for 2-4 weeks 06/15/14   Harriet Masson, DPM  metFORMIN (GLUCOPHAGE-XR) 500 MG 24 hr tablet TAKE 1 TABLET BY MOUTH EVERY DAY WITH BREAKFAST 01/21/16   Biagio Borg, MD  nystatin-triamcinolone [facility]) cream Apply 1 application topically 2 (two) times daily. 03/04/15   Laurie Panda, NP  telmisartan-hydrochlorothiazide (MICARDIS HCT) 80-12.5 MG tablet TAKE 1 TABLET EVERY DAY 12/24/15   Biagio Borg, MD   BP 151/81 mmHg  Pulse 72  Temp(Src) 98.3 F (36.8 C) (Oral)  Resp 18  SpO2 100% Physical Exam  Constitutional: She is oriented to person, place, and time. She appears well-developed and well-nourished.  Non-toxic appearance. No distress.  HENT:  Head: Normocephalic and atraumatic.  Eyes: Conjunctivae, EOM and lids are normal. Pupils are equal, round, and reactive to light.  Neck: Normal range of motion. Neck supple. No tracheal deviation present. No thyroid mass present.  Cardiovascular: Normal rate, regular rhythm and normal heart sounds.  Exam reveals no gallop.   No murmur heard. Pulmonary/Chest: Effort normal and breath sounds normal. No stridor. No respiratory distress. She has no decreased breath sounds. She has no wheezes. She has no rhonchi. She has no rales.  Abdominal: Soft. Normal appearance and bowel sounds are normal. She exhibits no distension. There is tenderness in the epigastric area and periumbilical area. There is no rebound and no CVA tenderness.    Musculoskeletal: Normal range of motion. She exhibits no edema or tenderness.  Neurological: She is alert and oriented to person, place, and time. She has normal strength. No cranial nerve deficit or sensory deficit. GCS eye subscore is 4. GCS verbal subscore is 5. GCS motor subscore is 6.  Skin: Skin is warm and dry. No abrasion and no rash noted.  Psychiatric: She has a normal mood and affect. Her  speech is normal and behavior is normal.  Nursing note and vitals reviewed.   ED Course  Procedures (including critical care time) Labs Review Labs Reviewed  LIPASE, BLOOD  COMPREHENSIVE METABOLIC PANEL  CBC  URINALYSIS, ROUTINE W REFLEX MICROSCOPIC (NOT AT [facility])    Imaging Review No results found. I have personally reviewed and evaluated these images and lab results as part of my medical decision-making.   EKG Interpretation None      MDM   Final diagnoses:  None  Patient without evidence of incarcerated ventral hernia. Stable for discharge  Lacretia Leigh, MD 01/30/16 2337

## 2016-01-30 NOTE — Progress Notes (Signed)
Pre visit review using our clinic review tool, if applicable. No additional management support is needed unless otherwise documented below in the visit note. 

## 2016-01-30 NOTE — Assessment & Plan Note (Signed)
stable overall by history and exam, recent data reviewed with pt, and pt to continue medical treatment as before,  to f/u any worsening symptoms or concerns Lab Results  Component Value Date   HGBA1C 6.6* 10/09/2014    

## 2016-01-30 NOTE — Assessment & Plan Note (Addendum)
May be element costochondritis, but cant r/o sympt ventral hernia; there is no change in med tx for now, to go to ER for further evaluation I suspect would include a CT abdpelvis to r/o incarcerated ventral hernia  Note:  Total time for pt hx, exam, review of record with pt in the room, determination of diagnoses and plan for further eval and tx is > 40 min, with over 50% spent in coordination and counseling of patient

## 2016-01-30 NOTE — ED Notes (Signed)
Asked for urine  

## 2016-01-30 NOTE — ED Notes (Signed)
Pt c/o upper abd pain x several days; pt went to see her PCP who advised her to come to the ER to rule out incarcerated hernia; pt states that the MD felt palpable mass; pt c/o increase pain with movement; pt denies N/V; pt states that the pain interferes with her daily life; pt reports recent GI bug in the last week

## 2016-01-30 NOTE — Assessment & Plan Note (Signed)
stable overall by history and exam, recent data reviewed with pt, and pt to continue medical treatment as before,  to f/u any worsening symptoms or concerns Lab Results  Component Value Date   WBC 4.8 09/02/2015   HGB 11.5* 09/02/2015   HCT 36.1 09/02/2015   PLT 227 09/02/2015   GLUCOSE 95 09/02/2015   CHOL 187 10/09/2014   TRIG 125.0 10/09/2014   HDL 48.70 10/09/2014   LDLDIRECT 76.5 02/17/2012   LDLCALC 113* 10/09/2014   ALT 15 09/02/2015   AST 20 09/02/2015   NA 141 09/02/2015   K 3.9 09/02/2015   CL 103 01/16/2015   CREATININE 0.8 09/02/2015   BUN 13.0 09/02/2015   CO2 25 09/02/2015   TSH 1.51 10/25/2014   HGBA1C 6.6* 10/09/2014   MICROALBUR 0.3 11/28/2013

## 2016-01-30 NOTE — Patient Instructions (Signed)
Your exam today brings the possibility of an incarcerated ventral hernia  Please go to [facility] ER for further evaluation tonight, as the office is closed and we will not be able to arrange a CT scan in a timely manner  You may show this to the ER providers on your arrival  Please continue all other medications as before, and refills have been done if requested.  Please have the pharmacy call with any other refills you may need.  Please keep your appointments with your specialists as you may have planned

## 2016-02-04 ENCOUNTER — Emergency Department (HOSPITAL_COMMUNITY): Payer: 59

## 2016-02-04 ENCOUNTER — Emergency Department (HOSPITAL_COMMUNITY)
Admission: EM | Admit: 2016-02-04 | Discharge: 2016-02-04 | Disposition: A | Discharge status: Home / Self Care | Payer: 59 | Attending: Emergency Medicine | Admitting: Emergency Medicine

## 2016-02-04 ENCOUNTER — Encounter (HOSPITAL_COMMUNITY): Payer: Self-pay | Admitting: Emergency Medicine

## 2016-02-04 DIAGNOSIS — M1990 Unspecified osteoarthritis, unspecified site: Secondary | ICD-10-CM | POA: Diagnosis not present

## 2016-02-04 DIAGNOSIS — Z853 Personal history of malignant neoplasm of breast: Secondary | ICD-10-CM | POA: Diagnosis not present

## 2016-02-04 DIAGNOSIS — I10 Essential (primary) hypertension: Secondary | ICD-10-CM | POA: Insufficient documentation

## 2016-02-04 DIAGNOSIS — Y999 Unspecified external cause status: Secondary | ICD-10-CM | POA: Insufficient documentation

## 2016-02-04 DIAGNOSIS — S93401A Sprain of unspecified ligament of right ankle, initial encounter: Secondary | ICD-10-CM

## 2016-02-04 DIAGNOSIS — Z7984 Long term (current) use of oral hypoglycemic drugs: Secondary | ICD-10-CM | POA: Diagnosis not present

## 2016-02-04 DIAGNOSIS — Z96652 Presence of left artificial knee joint: Secondary | ICD-10-CM | POA: Insufficient documentation

## 2016-02-04 DIAGNOSIS — Z791 Long term (current) use of non-steroidal anti-inflammatories (NSAID): Secondary | ICD-10-CM | POA: Insufficient documentation

## 2016-02-04 DIAGNOSIS — Z79899 Other long term (current) drug therapy: Secondary | ICD-10-CM | POA: Diagnosis not present

## 2016-02-04 DIAGNOSIS — E785 Hyperlipidemia, unspecified: Secondary | ICD-10-CM | POA: Insufficient documentation

## 2016-02-04 DIAGNOSIS — R079 Chest pain, unspecified: Secondary | ICD-10-CM | POA: Insufficient documentation

## 2016-02-04 DIAGNOSIS — S20211A Contusion of right front wall of thorax, initial encounter: Secondary | ICD-10-CM

## 2016-02-04 DIAGNOSIS — G4733 Obstructive sleep apnea (adult) (pediatric): Secondary | ICD-10-CM | POA: Insufficient documentation

## 2016-02-04 DIAGNOSIS — Y939 Activity, unspecified: Secondary | ICD-10-CM | POA: Diagnosis not present

## 2016-02-04 DIAGNOSIS — Y92481 Parking lot as the place of occurrence of the external cause: Secondary | ICD-10-CM | POA: Insufficient documentation

## 2016-02-04 DIAGNOSIS — E119 Type 2 diabetes mellitus without complications: Secondary | ICD-10-CM | POA: Diagnosis not present

## 2016-02-04 DIAGNOSIS — M25571 Pain in right ankle and joints of right foot: Secondary | ICD-10-CM | POA: Diagnosis not present

## 2016-02-04 MED ORDER — METHOCARBAMOL 500 MG PO TABS: 1000.0000 mg | ORAL_TABLET | Freq: Three times a day (TID) | ORAL | Status: DC | PRN

## 2016-02-04 MED ORDER — KETOROLAC TROMETHAMINE 60 MG/2ML IM SOLN
60.0000 mg | Freq: Once | INTRAMUSCULAR | Status: AC
  Administered 2016-02-04: 60 mg via INTRAMUSCULAR
  Filled 2016-02-04: qty 2

## 2016-02-04 MED ORDER — METHOCARBAMOL 500 MG PO TABS
500.0000 mg | ORAL_TABLET | Freq: Once | ORAL | Status: AC
  Administered 2016-02-04: 500 mg via ORAL
  Filled 2016-02-04: qty 1

## 2016-02-04 MED ORDER — IBUPROFEN 600 MG PO TABS: 600.0000 mg | ORAL_TABLET | Freq: Four times a day (QID) | ORAL | Status: DC | PRN

## 2016-02-04 NOTE — ED Notes (Signed)
RN explained to pt that Toradol medication should not make her feel drowsy, but the Robaxin may. Pt prefers to take pain meds after making calls.
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
## 2016-02-13 ENCOUNTER — Ambulatory Visit (INDEPENDENT_AMBULATORY_CARE_PROVIDER_SITE_OTHER): Payer: 59 | Admitting: Internal Medicine

## 2016-02-13 ENCOUNTER — Encounter: Payer: Self-pay | Admitting: Internal Medicine

## 2016-02-13 VITALS — BP 124/72 | HR 75 | Resp 20 | Wt 236.0 lb

## 2016-02-13 DIAGNOSIS — S2001XD Contusion of right breast, subsequent encounter: Secondary | ICD-10-CM

## 2016-02-13 DIAGNOSIS — M79604 Pain in right leg: Secondary | ICD-10-CM | POA: Diagnosis not present

## 2016-02-13 DIAGNOSIS — R0781 Pleurodynia: Secondary | ICD-10-CM

## 2016-02-13 DIAGNOSIS — Z1159 Encounter for screening for other viral diseases: Secondary | ICD-10-CM | POA: Diagnosis not present

## 2016-02-13 DIAGNOSIS — R071 Chest pain on breathing: Secondary | ICD-10-CM

## 2016-02-13 DIAGNOSIS — Z0001 Encounter for general adult medical examination with abnormal findings: Secondary | ICD-10-CM

## 2016-02-13 DIAGNOSIS — E119 Type 2 diabetes mellitus without complications: Secondary | ICD-10-CM

## 2016-02-13 DIAGNOSIS — R6889 Other general symptoms and signs: Secondary | ICD-10-CM | POA: Diagnosis not present
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
## 2016-02-13 NOTE — Progress Notes (Signed)
Pre visit review using our clinic review tool, if applicable. No additional management support is needed unless otherwise documented below in the visit note. 

## 2016-02-13 NOTE — Patient Instructions (Signed)
Please continue all other medications as before, and refills have been done if requested.  Please have the pharmacy call with any other refills you may need.  Please continue your efforts at being more active, low cholesterol diet, and weight control.  You are otherwise up to date with prevention measures today.  Please keep your appointments with your specialists as you may have planned  Please go to the LAB in the Basement (turn left off the elevator) for the tests to be done at your convenience  You will be contacted by phone if any changes need to be made immediately.  Otherwise, you will receive a letter about your results with an explanation, but please check with MyChart first.  Please remember to sign up for MyChart if you have not done so, as this will be important to you in the future with finding out test results, communicating by private email, and scheduling acute appointments online when needed.  Please return in 6 months, or sooner if needed 

## 2016-02-16 DIAGNOSIS — S2001XA Contusion of right breast, initial encounter: Secondary | ICD-10-CM | POA: Insufficient documentation

## 2016-02-16 DIAGNOSIS — M79604 Pain in right leg: Secondary | ICD-10-CM | POA: Insufficient documentation

## 2016-02-16 DIAGNOSIS — R0781 Pleurodynia: Secondary | ICD-10-CM | POA: Insufficient documentation
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
## 2016-02-16 NOTE — Assessment & Plan Note (Signed)

## 2016-02-16 NOTE — Assessment & Plan Note (Signed)
stable overall by history and exam, recent data reviewed with pt, and pt to continue medical treatment as before,  to f/u any worsening symptoms or concerns Lab Results  Component Value Date   HGBA1C 6.6* 10/09/2014    
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
## 2016-02-19 ENCOUNTER — Telehealth: Payer: Self-pay

## 2016-02-19 ENCOUNTER — Other Ambulatory Visit (INDEPENDENT_AMBULATORY_CARE_PROVIDER_SITE_OTHER): Payer: 59

## 2016-02-19 DIAGNOSIS — E108 Type 1 diabetes mellitus with unspecified complications: Secondary | ICD-10-CM

## 2016-02-19 DIAGNOSIS — Z1159 Encounter for screening for other viral diseases: Secondary | ICD-10-CM

## 2016-02-19 LAB — CBC WITH DIFFERENTIAL/PLATELET
Basophils Absolute: 0 10*3/uL (ref 0.0–0.1)
Basophils Relative: 0.5 % (ref 0.0–3.0)
Eosinophils Absolute: 0.1 10*3/uL (ref 0.0–0.7)
Eosinophils Relative: 2 % (ref 0.0–5.0)
HCT: 36.7 % (ref 36.0–46.0)
Hemoglobin: 12 g/dL (ref 12.0–15.0)
Lymphocytes Relative: 31.5 % (ref 12.0–46.0)
Lymphs Abs: 1.9 10*3/uL (ref 0.7–4.0)
MCHC: 32.8 g/dL (ref 30.0–36.0)
MCV: 84.7 fl (ref 78.0–100.0)
Monocytes Absolute: 0.6 10*3/uL (ref 0.1–1.0)
Monocytes Relative: 10.1 % (ref 3.0–12.0)
Neutro Abs: 3.4 10*3/uL (ref 1.4–7.7)
Neutrophils Relative %: 55.9 % (ref 43.0–77.0)
Platelets: 271 10*3/uL (ref 150.0–400.0)
RBC: 4.33 Mil/uL (ref 3.87–5.11)
RDW: 14.6 % (ref 11.5–15.5)
WBC: 6.1 10*3/uL (ref 4.0–10.5)

## 2016-02-19 LAB — LIPID PANEL
Cholesterol: 177 mg/dL (ref 0–200)
HDL: 62.8 mg/dL (ref >39.00)
LDL Cholesterol: 97 mg/dL (ref 0–99)
NonHDL: 114.56
Total CHOL/HDL Ratio: 3
Triglycerides: 88 mg/dL (ref 0.0–149.0)
VLDL: 17.6 mg/dL (ref 0.0–40.0)

## 2016-02-19 LAB — BASIC METABOLIC PANEL
BUN: 14 mg/dL (ref 6–23)
CO2: 28 mEq/L (ref 19–32)
Calcium: 9.4 mg/dL (ref 8.4–10.5)
Chloride: 106 mEq/L (ref 96–112)
Creatinine, Ser: 0.59 mg/dL (ref 0.40–1.20)
GFR: 134.92 mL/min (ref >60.00)
Glucose, Bld: 90 mg/dL (ref 70–99)
Potassium: 3.7 mEq/L (ref 3.5–5.1)
Sodium: 142 mEq/L (ref 135–145)

## 2016-02-19 LAB — URINALYSIS, ROUTINE W REFLEX MICROSCOPIC
Bilirubin Urine: NEGATIVE
Ketones, ur: NEGATIVE
Leukocytes, UA: NEGATIVE
Nitrite: NEGATIVE
Specific Gravity, Urine: 1.025 (ref 1.000–1.030)
Total Protein, Urine: NEGATIVE
Urine Glucose: NEGATIVE
Urobilinogen, UA: 0.2 (ref 0.0–1.0)
pH: 6 (ref 5.0–8.0)

## 2016-02-19 LAB — HEMOGLOBIN A1C: Hgb A1c MFr Bld: 6.6 % — ABNORMAL HIGH (ref 4.6–6.5)

## 2016-02-19 LAB — HEPATIC FUNCTION PANEL
ALT: 13 U/L (ref 0–35)
AST: 13 U/L (ref 0–37)
Albumin: 3.9 g/dL (ref 3.5–5.2)
Alkaline Phosphatase: 44 U/L (ref 39–117)
Bilirubin, Direct: 0.1 mg/dL (ref 0.0–0.3)
Total Bilirubin: 0.4 mg/dL (ref 0.2–1.2)
Total Protein: 7.7 g/dL (ref 6.0–8.3)

## 2016-02-19 LAB — TSH: TSH: 1.3 u[IU]/mL (ref 0.35–4.50)

## 2016-02-19 LAB — HEPATITIS C ANTIBODY: HCV Ab: NEGATIVE

## 2016-02-19 NOTE — Telephone Encounter (Signed)
Orders placed.

## 2016-02-26 ENCOUNTER — Encounter: Payer: 59 | Admitting: Internal Medicine

## 2016-03-23 ENCOUNTER — Other Ambulatory Visit (HOSPITAL_BASED_OUTPATIENT_CLINIC_OR_DEPARTMENT_OTHER): Payer: 59

## 2016-03-23 DIAGNOSIS — C50012 Malignant neoplasm of nipple and areola, left female breast: Secondary | ICD-10-CM

## 2016-03-23 LAB — COMPREHENSIVE METABOLIC PANEL
ALT: 19 U/L (ref 0–55)
AST: 20 U/L (ref 5–34)
Albumin: 3.6 g/dL (ref 3.5–5.0)
Alkaline Phosphatase: 45 U/L (ref 40–150)
Anion Gap: 7 mEq/L (ref 3–11)
BUN: 10 mg/dL (ref 7.0–26.0)
CO2: 25 mEq/L (ref 22–29)
Calcium: 9.2 mg/dL (ref 8.4–10.4)
Chloride: 109 mEq/L (ref 98–109)
Creatinine: 0.8 mg/dL (ref 0.6–1.1)
EGFR: >90 mL/min/{1.73_m2} (ref >90)
Glucose: 118 mg/dl (ref 70–140)
Potassium: 3.9 mEq/L (ref 3.5–5.1)
Sodium: 141 mEq/L (ref 136–145)
Total Bilirubin: 0.38 mg/dL (ref 0.20–1.20)
Total Protein: 7.8 g/dL (ref 6.4–8.3)

## 2016-03-23 LAB — CBC WITH DIFFERENTIAL/PLATELET
BASO%: 0.8 % (ref 0.0–2.0)
Basophils Absolute: 0 10*3/uL (ref 0.0–0.1)
EOS%: 1.9 % (ref 0.0–7.0)
Eosinophils Absolute: 0.1 10*3/uL (ref 0.0–0.5)
HCT: 37.8 % (ref 34.8–46.6)
HGB: 12.1 g/dL (ref 11.6–15.9)
LYMPH%: 30.1 % (ref 14.0–49.7)
MCH: 27.6 pg (ref 25.1–34.0)
MCHC: 31.9 g/dL (ref 31.5–36.0)
MCV: 86.6 fL (ref 79.5–101.0)
MONO#: 0.4 10*3/uL (ref 0.1–0.9)
MONO%: 10.1 % (ref 0.0–14.0)
NEUT#: 2.2 10*3/uL (ref 1.5–6.5)
NEUT%: 57.1 % (ref 38.4–76.8)
Platelets: 215 10*3/uL (ref 145–400)
RBC: 4.37 10*6/uL (ref 3.70–5.45)
RDW: 15 % — ABNORMAL HIGH (ref 11.2–14.5)
WBC: 3.9 10*3/uL (ref 3.9–10.3)
lymph#: 1.2 10*3/uL (ref 0.9–3.3)

## 2016-03-30 ENCOUNTER — Ambulatory Visit (HOSPITAL_BASED_OUTPATIENT_CLINIC_OR_DEPARTMENT_OTHER): Payer: 59 | Admitting: Oncology

## 2016-03-30 VITALS — BP 123/76 | HR 65 | Temp 98.2°F | Resp 18 | Ht 59.0 in | Wt 223.1 lb

## 2016-03-30 DIAGNOSIS — Z853 Personal history of malignant neoplasm of breast: Secondary | ICD-10-CM

## 2016-03-30 DIAGNOSIS — C50112 Malignant neoplasm of central portion of left female breast: Secondary | ICD-10-CM

## 2016-03-30 DIAGNOSIS — D509 Iron deficiency anemia, unspecified: Secondary | ICD-10-CM
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
## 2016-03-31 ENCOUNTER — Telehealth: Payer: Self-pay | Admitting: Oncology

## 2016-03-31 NOTE — Telephone Encounter (Signed)
appt made and letter sent by mail °

## 2016-05-22 ENCOUNTER — Other Ambulatory Visit: Payer: Self-pay | Admitting: Obstetrics and Gynecology

## 2016-05-22 NOTE — Patient Instructions (Addendum)
Your procedure is scheduled on:  Friday, June 05, 2016  Enter through the Main Entrance of [facility] at:  11:45 AM  Pick up the phone at the desk and dial [phone].  Call this number if you have problems the morning of surgery: [phone].  Remember: Do NOT eat food:  After Midnight Thursday, June 04, 2016  Do NOT drink clear liquids after:  9:00 AM morning of surgery  Take these medicines the morning of surgery with a SIP OF WATER:  Telmisartan  Do NOT wear jewelry (body piercing), metal hair clips/bobby pins, make-up, or nail polish. Do NOT wear lotions, powders, or perfumes.  You may wear deodorant. Do NOT shave for 48 hours prior to surgery. Do NOT bring valuables to the hospital. Contacts, dentures, or bridgework may not be worn into surgery.  Have a responsible adult drive you home and stay with you for 24 hours after your procedure

## 2016-05-25 ENCOUNTER — Other Ambulatory Visit: Payer: Self-pay

## 2016-05-25 ENCOUNTER — Other Ambulatory Visit: Payer: Self-pay | Admitting: Obstetrics and Gynecology

## 2016-05-25 ENCOUNTER — Encounter (HOSPITAL_COMMUNITY)
Admission: RE | Admit: 2016-05-25 | Discharge: 2016-05-25 | Disposition: A | Discharge status: Home / Self Care | Payer: 59 | Source: Ambulatory Visit | Attending: Obstetrics and Gynecology | Admitting: Obstetrics and Gynecology

## 2016-05-25 ENCOUNTER — Encounter (HOSPITAL_COMMUNITY): Payer: Self-pay

## 2016-05-25 DIAGNOSIS — N318 Other neuromuscular dysfunction of bladder: Secondary | ICD-10-CM | POA: Insufficient documentation

## 2016-05-25 DIAGNOSIS — E785 Hyperlipidemia, unspecified: Secondary | ICD-10-CM | POA: Diagnosis not present

## 2016-05-25 DIAGNOSIS — R0609 Other forms of dyspnea: Secondary | ICD-10-CM | POA: Insufficient documentation

## 2016-05-25 DIAGNOSIS — R001 Bradycardia, unspecified: Secondary | ICD-10-CM | POA: Diagnosis not present

## 2016-05-25 DIAGNOSIS — M797 Fibromyalgia: Secondary | ICD-10-CM | POA: Diagnosis not present

## 2016-05-25 DIAGNOSIS — M549 Dorsalgia, unspecified: Secondary | ICD-10-CM | POA: Insufficient documentation

## 2016-05-25 DIAGNOSIS — Z853 Personal history of malignant neoplasm of breast: Secondary | ICD-10-CM | POA: Insufficient documentation

## 2016-05-25 DIAGNOSIS — E119 Type 2 diabetes mellitus without complications: Secondary | ICD-10-CM | POA: Diagnosis not present

## 2016-05-25 DIAGNOSIS — D509 Iron deficiency anemia, unspecified: Secondary | ICD-10-CM | POA: Insufficient documentation

## 2016-05-25 DIAGNOSIS — F418 Other specified anxiety disorders: Secondary | ICD-10-CM | POA: Diagnosis not present

## 2016-05-25 DIAGNOSIS — G4733 Obstructive sleep apnea (adult) (pediatric): Secondary | ICD-10-CM | POA: Diagnosis not present

## 2016-05-25 DIAGNOSIS — K219 Gastro-esophageal reflux disease without esophagitis: Secondary | ICD-10-CM | POA: Diagnosis not present

## 2016-05-25 DIAGNOSIS — D259 Leiomyoma of uterus, unspecified: Secondary | ICD-10-CM | POA: Insufficient documentation

## 2016-05-25 DIAGNOSIS — N920 Excessive and frequent menstruation with regular cycle: Secondary | ICD-10-CM | POA: Diagnosis present

## 2016-05-25 HISTORY — DX: Dermatitis, unspecified: L30.9

## 2016-05-25 LAB — BASIC METABOLIC PANEL
Anion gap: 6 (ref 5–15)
BUN: 16 mg/dL (ref 6–20)
CO2: 25 mmol/L (ref 22–32)
Calcium: 9.3 mg/dL (ref 8.9–10.3)
Chloride: 107 mmol/L (ref 101–111)
Creatinine, Ser: 0.69 mg/dL (ref 0.44–1.00)
GFR calc Af Amer: >60 mL/min (ref >60)
GFR calc non Af Amer: >60 mL/min (ref >60)
Glucose, Bld: 96 mg/dL (ref 65–99)
Potassium: 3.8 mmol/L (ref 3.5–5.1)
Sodium: 138 mmol/L (ref 135–145)

## 2016-05-25 LAB — CBC
HCT: 35.9 % — ABNORMAL LOW (ref 36.0–46.0)
Hemoglobin: 11.9 g/dL — ABNORMAL LOW (ref 12.0–15.0)
MCH: 28.1 pg (ref 26.0–34.0)
MCHC: 33.1 g/dL (ref 30.0–36.0)
MCV: 84.9 fL (ref 78.0–100.0)
Platelets: 250 10*3/uL (ref 150–400)
RBC: 4.23 MIL/uL (ref 3.87–5.11)
RDW: 14.4 % (ref 11.5–15.5)
WBC: 4.6 10*3/uL (ref 4.0–10.5)

## 2016-06-02 ENCOUNTER — Encounter (HOSPITAL_COMMUNITY): Payer: Self-pay | Admitting: Obstetrics and Gynecology

## 2016-06-02 DIAGNOSIS — N939 Abnormal uterine and vaginal bleeding, unspecified: Secondary | ICD-10-CM | POA: Diagnosis present

## 2016-06-02 DIAGNOSIS — N924 Excessive bleeding in the premenopausal period: Secondary | ICD-10-CM | POA: Diagnosis present

## 2016-06-02 NOTE — H&P (Signed)
[name] is an 60 y.o. female.  She presents for evaluation of abnormal uterine bleeding which started intermittently in the perimenopausal time frame when both her [place] and estradiol were in premenopausal range.  She has continued to have episodes of vaginal spotting, never heavy, but sometimes as much as a period, and has declined until now to undergo surgical evaluation.Initially this bleeding occurred while taking tamoxifen and she discontinued this medication without agreement from her oncologist.  Most recently she has had bleeding even in the absence of tamoxifen.    Pertinent Gynecological History: Menses: perimenopausal with last regular menses in 2014.  FSH is postmenopausal, but estrogen is slightly higher than normal for after menopause Bleeding: perimenopausal Contraception: abstinence DES exposure: unknown Blood transfusions: none Sexually transmitted diseases: no past history Previous GYN Procedures: DNC and myomectomy  Last mammogram: normal Date: 03/19/16 Last pap: normal Date: 2016 OB History: G1, P0.  Son died at age 38   Menstrual History: Menarche age: 78 No LMP recorded. Patient is not currently having periods (Reason: Perimenopausal).    Past Medical History:  Diagnosis Date  . Abscess of buttock   . Allergy   . Anemia   . Arthritis    back  . Bacterial infection   . Boil of buttock   . Breast cancer ([place])    2012  . COLONIC POLYPS, HX OF 05/11/2008  . Diabetes mellitus without complication ([place])   . Dysrhythmia    patient denies 05/25/2016  . Eczema   . GENITAL HERPES, HX OF 08/08/2009  . H/O gonorrhea   . H/O hiatal hernia   . H/O irritable bowel syndrome   . Headache    "shooting pains" left side of head MRI done 2016 (negative results)  . Hernia   . HYPERLIPIDEMIA 05/11/2008   Pt denies  . Hypertension   . 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 Incontinence in female   . Inverted nipple   . LLQ pain   . Low iron   . Menorrhagia   .  OBSTRUCTIVE SLEEP APNEA 05/11/2008   not using CPAP at this time  . Occasional numbness/prickling/tingling of fingers and toes    right foot, right hand  . RASH-NONVESICULAR 06/29/2008  . Shortness of breath dyspnea    with exertion, not a current issue  . Trichomonas   . Urine frequency     Past Surgical History:  Procedure Laterality Date  . AXILLARY LYMPH NODE DISSECTION    . BREAST CYST EXCISION  1973  . BREAST LUMPECTOMY    . BREAST LUMPECTOMY WITH NEEDLE LOCALIZATION Right 12/20/2013   Procedure: EXCISION RIGHT BREAST MASS WITH NEEDLE LOCALIZATION;  Surgeon: Stark Klein, MD;  Location: [facility];  Service: General;  Laterality: Right;  . CESAREAN SECTION    . COLONOSCOPY    . DILATION AND CURETTAGE OF UTERUS    . left achilles tendon repair    . right achilles tendon     and left  . right ovarian cyst     hx  . s/p ear surgury    . s/p extra uterine fibroid  2006  . s/p left knee replacement  2007  . UTERINE FIBROID SURGERY  2006    Family History  Problem Relation Age of Onset  . Diabetes Mother   . Hypertension Mother   . Stroke Mother   . Heart disease Father     COPD  . Alcohol abuse Father     ETOH dependence  . Cancer  Maternal Aunt     breast  . Colon cancer Neg Hx     Social History:  reports that she has never smoked. She has never used smokeless tobacco. She reports that she drinks alcohol. She reports that she does not use drugs.  Allergies:  Allergies  Allergen Reactions  . Codeine Nausea Only  . Cymbalta [Duloxetine Hcl] Other (See Comments)    Head felt funny, ? thinking not right  . Darvon Nausea Only  . Hydrocodone Nausea Only  . Oxycodone Nausea Only  . Propoxyphene Hcl Nausea Only    No prescriptions prior to admission.    Review of Systems  Constitutional:       Difficulty with ambulation, thought to be orthopedic  Eyes: Positive for blurred vision.       Intermittent  Respiratory: Negative.   Cardiovascular: Positive for leg  swelling.  Gastrointestinal: Positive for heartburn.  Genitourinary: Positive for frequency and urgency.  Musculoskeletal: Positive for myalgias.       Fibromyalgia  Skin: Positive for rash.       eczema  Neurological: Negative.   Psychiatric/Behavioral: Negative.     There were no vitals taken for this visit. Physical Exam  Constitutional: She is oriented to person, place, and time. She appears well-developed and well-nourished.  Severe obesity  HENT:  Head: Normocephalic and atraumatic.  Eyes: Conjunctivae and EOM are normal.  Neck: Normal range of motion. Neck supple.  Cardiovascular: Normal rate and regular rhythm.   Respiratory: Effort normal and breath sounds normal.  GI: Soft. Bowel sounds are normal.  Exam limited by habitus  Musculoskeletal:  Limited range of hip motion bilaterally  Neurological: She is alert and oriented to person, place, and time.  Skin: Skin is warm and dry.  Psychiatric: She has a normal mood and affect.   Office ultrasound 7/17= small uterine fibroids.  Endometrial thickness 9 mm.  No adnexal masses  No results found for this or any previous visit (from the past 24 hour(s)).  No results found.  Assessment 1) Abnormal uterine bleeding that has occurred off and on since 2016, initially in the presence of tamoxifen and now without tamoxifen. 2) left breast cancer, invasive intraductal.  S/p lumpectomy and radiation with short course of tamoxifen discontinued by patient without advice of oncologist. 3) Perimenopausal vs postmenopausal time frame 4) severe obesity with OSA  Recommendation 1) Hysteroscopy with d&c recommended and accepted.  The logistics of the procedure were reviewed, as were the post-operative recovery time. The\risks of the procedure were reviewed including, but not limited to, bleeding, infections,\damage to pelvic or abdominal organs, bladder, bowel, blood vessels, nerves. We also\discussed the risk of uterine perforation and  the possible necessity of laparoscopy if that\occurs. Pt agrees that all her questions have been answered  Valoree Agent P 06/02/2016, 12:32 PM

## 2016-06-04 ENCOUNTER — Other Ambulatory Visit: Payer: Self-pay | Admitting: Obstetrics and Gynecology

## 2016-06-05 ENCOUNTER — Ambulatory Visit (HOSPITAL_COMMUNITY): Payer: 59 | Admitting: Anesthesiology

## 2016-06-05 ENCOUNTER — Encounter (HOSPITAL_COMMUNITY): Payer: Self-pay

## 2016-06-05 ENCOUNTER — Ambulatory Visit (HOSPITAL_COMMUNITY)
Admission: RE | Admit: 2016-06-05 | Discharge: 2016-06-05 | Disposition: A | Discharge status: Home / Self Care | Payer: 59 | Source: Ambulatory Visit | Attending: Obstetrics and Gynecology | Admitting: Obstetrics and Gynecology

## 2016-06-05 ENCOUNTER — Encounter (HOSPITAL_COMMUNITY)
Admission: RE | Disposition: A | Discharge status: Home / Self Care | Payer: Self-pay | Source: Ambulatory Visit | Attending: Obstetrics and Gynecology

## 2016-06-05 DIAGNOSIS — N938 Other specified abnormal uterine and vaginal bleeding: Secondary | ICD-10-CM | POA: Insufficient documentation

## 2016-06-05 DIAGNOSIS — C541 Malignant neoplasm of endometrium: Secondary | ICD-10-CM | POA: Diagnosis not present

## 2016-06-05 DIAGNOSIS — M1990 Unspecified osteoarthritis, unspecified site: Secondary | ICD-10-CM | POA: Diagnosis not present

## 2016-06-05 DIAGNOSIS — G4730 Sleep apnea, unspecified: Secondary | ICD-10-CM | POA: Insufficient documentation

## 2016-06-05 DIAGNOSIS — I10 Essential (primary) hypertension: Secondary | ICD-10-CM | POA: Diagnosis not present

## 2016-06-05 DIAGNOSIS — M797 Fibromyalgia: Secondary | ICD-10-CM | POA: Insufficient documentation

## 2016-06-05 DIAGNOSIS — E119 Type 2 diabetes mellitus without complications: Secondary | ICD-10-CM | POA: Diagnosis not present

## 2016-06-05 DIAGNOSIS — K219 Gastro-esophageal reflux disease without esophagitis: Secondary | ICD-10-CM | POA: Insufficient documentation

## 2016-06-05 DIAGNOSIS — N924 Excessive bleeding in the premenopausal period: Secondary | ICD-10-CM | POA: Diagnosis present

## 2016-06-05 DIAGNOSIS — N939 Abnormal uterine and vaginal bleeding, unspecified: Secondary | ICD-10-CM | POA: Diagnosis present

## 2016-06-05 HISTORY — PX: DILATATION & CURRETTAGE/HYSTEROSCOPY WITH RESECTOCOPE: SHX5572

## 2016-06-05 LAB — GLUCOSE, CAPILLARY
Glucose-Capillary: 97 mg/dL (ref 65–99)
Glucose-Capillary: 69 mg/dL (ref 65–99)

## 2016-06-05 SURGERY — DILATATION & CURETTAGE/HYSTEROSCOPY WITH RESECTOCOPE
Anesthesia: General

## 2016-06-05 MED ORDER — PHENYLEPHRINE 40 MCG/ML (10ML) SYRINGE FOR IV PUSH (FOR BLOOD PRESSURE SUPPORT)
PREFILLED_SYRINGE | INTRAVENOUS | Status: AC
  Filled 2016-06-05: qty 10

## 2016-06-05 MED ORDER — SODIUM CHLORIDE 0.9 % IR SOLN
Status: DC | PRN
  Administered 2016-06-05: 3000 mL

## 2016-06-05 MED ORDER — SCOPOLAMINE 1 MG/3DAYS TD PT72
1.0000 | MEDICATED_PATCH | Freq: Once | TRANSDERMAL | Status: DC
  Administered 2016-06-05: 1.5 mg via TRANSDERMAL

## 2016-06-05 MED ORDER — MIDAZOLAM HCL 2 MG/2ML IJ SOLN
INTRAMUSCULAR | Status: DC | PRN
  Administered 2016-06-05 (×2): 1 mg via INTRAVENOUS

## 2016-06-05 MED ORDER — PHENYLEPHRINE HCL 10 MG/ML IJ SOLN
INTRAMUSCULAR | Status: DC | PRN
  Administered 2016-06-05 (×9): 40 ug via INTRAVENOUS

## 2016-06-05 MED ORDER — ONDANSETRON HCL 4 MG/2ML IJ SOLN
INTRAMUSCULAR | Status: DC | PRN
  Administered 2016-06-05: 4 mg via INTRAVENOUS

## 2016-06-05 MED ORDER — KETOROLAC TROMETHAMINE 30 MG/ML IJ SOLN
INTRAMUSCULAR | Status: DC | PRN
  Administered 2016-06-05: 30 mg via INTRAVENOUS

## 2016-06-05 MED ORDER — DEXAMETHASONE SODIUM PHOSPHATE 4 MG/ML IJ SOLN
INTRAMUSCULAR | Status: AC
  Filled 2016-06-05: qty 1

## 2016-06-05 MED ORDER — DEXAMETHASONE SODIUM PHOSPHATE 4 MG/ML IJ SOLN
INTRAMUSCULAR | Status: DC | PRN
  Administered 2016-06-05: 4 mg via INTRAVENOUS

## 2016-06-05 MED ORDER — EPHEDRINE 5 MG/ML INJ
INTRAVENOUS | Status: AC
  Filled 2016-06-05: qty 10

## 2016-06-05 MED ORDER — ONDANSETRON HCL 4 MG/2ML IJ SOLN
INTRAMUSCULAR | Status: AC
  Filled 2016-06-05: qty 2

## 2016-06-05 MED ORDER — FENTANYL CITRATE (PF) 100 MCG/2ML IJ SOLN
INTRAMUSCULAR | Status: DC | PRN
  Administered 2016-06-05 (×2): 50 ug via INTRAVENOUS

## 2016-06-05 MED ORDER — LIDOCAINE HCL (CARDIAC) 20 MG/ML IV SOLN
INTRAVENOUS | Status: DC | PRN
  Administered 2016-06-05: 80 mg via INTRAVENOUS

## 2016-06-05 MED ORDER — PROPOFOL 10 MG/ML IV BOLUS
INTRAVENOUS | Status: AC
  Filled 2016-06-05: qty 20

## 2016-06-05 MED ORDER — SCOPOLAMINE 1 MG/3DAYS TD PT72
MEDICATED_PATCH | TRANSDERMAL | Status: AC
  Filled 2016-06-05: qty 1

## 2016-06-05 MED ORDER — KETOROLAC TROMETHAMINE 30 MG/ML IJ SOLN
INTRAMUSCULAR | Status: AC
  Filled 2016-06-05: qty 1

## 2016-06-05 MED ORDER — EPHEDRINE SULFATE 50 MG/ML IJ SOLN
INTRAMUSCULAR | Status: DC | PRN
Start: 2016-06-05 — End: 2016-06-05
  Administered 2016-06-05: 5 mg via INTRAVENOUS
  Administered 2016-06-05: 10 mg via INTRAVENOUS

## 2016-06-05 MED ORDER — LIDOCAINE HCL (CARDIAC) 20 MG/ML IV SOLN
INTRAVENOUS | Status: AC
  Filled 2016-06-05: qty 5

## 2016-06-05 MED ORDER — ONDANSETRON HCL 4 MG/2ML IJ SOLN: 4.0000 mg | Freq: Once | INTRAMUSCULAR | Status: DC | PRN

## 2016-06-05 MED ORDER — LACTATED RINGERS IV SOLN
INTRAVENOUS | Status: DC
  Administered 2016-06-05 (×2): via INTRAVENOUS

## 2016-06-05 MED ORDER — LIDOCAINE HCL 2 % IJ SOLN
INTRAMUSCULAR | Status: AC
  Filled 2016-06-05: qty 20

## 2016-06-05 MED ORDER — FENTANYL CITRATE (PF) 100 MCG/2ML IJ SOLN
INTRAMUSCULAR | Status: AC
  Filled 2016-06-05: qty 2

## 2016-06-05 MED ORDER — FENTANYL CITRATE (PF) 100 MCG/2ML IJ SOLN
25.0000 ug | INTRAMUSCULAR | Status: DC | PRN
  Administered 2016-06-05: 25 ug via INTRAVENOUS

## 2016-06-05 MED ORDER — GLYCOPYRROLATE 0.2 MG/ML IJ SOLN
INTRAMUSCULAR | Status: AC
  Filled 2016-06-05: qty 1

## 2016-06-05 MED ORDER — PROPOFOL 10 MG/ML IV BOLUS
INTRAVENOUS | Status: DC | PRN
  Administered 2016-06-05: 200 mg via INTRAVENOUS

## 2016-06-05 MED ORDER — IBUPROFEN 600 MG PO TABS: ORAL_TABLET | ORAL | 0 refills | Status: DC

## 2016-06-05 MED ORDER — LIDOCAINE HCL 2 % IJ SOLN
INTRAMUSCULAR | Status: DC | PRN
  Administered 2016-06-05: 10 mL

## 2016-06-05 MED ORDER — MIDAZOLAM HCL 2 MG/2ML IJ SOLN
INTRAMUSCULAR | Status: AC
  Filled 2016-06-05: qty 2

## 2016-06-05 SURGICAL SUPPLY — 20 items
BIPOLAR CUTTING LOOP 21FR (ELECTRODE)
BOOTIES KNEE HIGH SLOAN (MISCELLANEOUS) ×4 IMPLANT
CANISTER SUCT 3000ML (MISCELLANEOUS) ×2 IMPLANT
CATH ROBINSON RED A/P 16FR (CATHETERS) ×2 IMPLANT
CLOTH BEACON ORANGE TIMEOUT ST (SAFETY) ×2 IMPLANT
CONTAINER PREFILL 10% NBF 60ML (FORM) ×4 IMPLANT
DILATOR CANAL MILEX (MISCELLANEOUS) ×1 IMPLANT
ELECT REM PT RETURN 9FT ADLT (ELECTROSURGICAL) ×2
ELECTRODE REM PT RTRN 9FT ADLT (ELECTROSURGICAL) ×1 IMPLANT
GLOVE BIOGEL PI IND STRL 7.0 (GLOVE) ×1 IMPLANT
GLOVE BIOGEL PI INDICATOR 7.0 (GLOVE) ×1
GLOVE SURG SS PI 6.5 STRL IVOR (GLOVE) ×4 IMPLANT
GOWN STRL REUS W/TWL LRG LVL3 (GOWN DISPOSABLE) ×4 IMPLANT
LOOP CUTTING BIPOLAR 21FR (ELECTRODE) IMPLANT
PACK VAGINAL MINOR WOMEN LF (CUSTOM PROCEDURE TRAY) ×2 IMPLANT
PAD OB MATERNITY 4.3X12.25 (PERSONAL CARE ITEMS) ×2 IMPLANT
TOWEL OR 17X24 6PK STRL BLUE (TOWEL DISPOSABLE) ×4 IMPLANT
TUBING AQUILEX INFLOW (TUBING) ×2 IMPLANT
TUBING AQUILEX OUTFLOW (TUBING) ×2 IMPLANT
WATER STERILE IRR 1000ML POUR (IV SOLUTION) ×2 IMPLANT

## 2016-06-05 NOTE — H&P (Signed)
History and Physical Interval Note:   06/05/2016   11:51 AM   [name]  has presented today for surgery, with the diagnosis of Abnormal Uterine and vaginal bleeding.  The patinet has had another episode of vaginal bleeding this week.  The various methods of treatment have been discussed with the patient and family. After consideration of risks, benefits and other options for treatment, the patient has consented to  Procedure(s): [place] as a surgical intervention .  I have reviewed the patients' chart and labs.  Questions were answered to the patient's satisfaction.     Eldred Manges  MD

## 2016-06-05 NOTE — Anesthesia Preprocedure Evaluation (Signed)
Anesthesia Evaluation  Patient identified by MRN, date of birth, ID band Patient awake    Reviewed: Allergy & Precautions, NPO status , Patient's Chart, lab work & pertinent test results  History of Anesthesia Complications Negative for: history of anesthetic complications  Airway Mallampati: II  TM Distance: >3 FB Neck ROM: Full    Dental no notable dental hx. (+) Dental Advisory Given, Poor Dentition, Missing   Pulmonary sleep apnea ,    Pulmonary exam normal breath sounds clear to auscultation       Cardiovascular hypertension, Normal cardiovascular exam Rhythm:Regular Rate:Normal     Neuro/Psych  Headaches, PSYCHIATRIC DISORDERS    GI/Hepatic Neg liver ROS, GERD  Medicated,  Endo/Other  diabetesMorbid obesity  Renal/GU negative Renal ROS  negative genitourinary   Musculoskeletal  (+) Arthritis , Fibromyalgia -  Abdominal   Peds negative pediatric ROS (+)  Hematology negative hematology ROS (+)   Anesthesia Other Findings   Reproductive/Obstetrics negative OB ROS                             Anesthesia Physical Anesthesia Plan  ASA: III  Anesthesia Plan: General   Post-op Pain Management:    Induction: Intravenous  Airway Management Planned: LMA  Additional Equipment:   Intra-op Plan:   Post-operative Plan: Extubation in OR  Informed Consent: I have reviewed the patients History and Physical, chart, labs and discussed the procedure including the risks, benefits and alternatives for the proposed anesthesia with the patient or authorized representative who has indicated his/her understanding and acceptance.   Dental advisory given  Plan Discussed with: CRNA  Anesthesia Plan Comments:         Anesthesia Quick Evaluation

## 2016-06-05 NOTE — Transfer of Care (Signed)
Immediate Anesthesia Transfer of Care Note  Patient: [name]  Procedure(s) Performed: Procedure(s): DILATATION & CURETTAGE/HYSTEROSCOPY (N/A)  Patient Location: PACU  Anesthesia Type:General  Level of Consciousness: awake, alert  and oriented  Airway & Oxygen Therapy: Patient Spontanous Breathing and Patient connected to nasal cannula oxygen  Post-op Assessment: Report given to RN and Post -op Vital signs reviewed and stable  Post vital signs: Reviewed and stable  Last Vitals:  Vitals:   06/05/16 1144 06/05/16 1412  BP: (!) 114/50   Pulse: 66   Resp: 20   Temp: 36.8 C 36.4 C    Last Pain:  Vitals:   06/05/16 1144  TempSrc: Oral         Complications: No apparent anesthesia complications

## 2016-06-05 NOTE — Anesthesia Postprocedure Evaluation (Signed)
Anesthesia Post Note  Patient: [name]  Procedure(s) Performed: Procedure(s) (LRB): DILATATION & CURETTAGE/HYSTEROSCOPY (N/A)  Patient location during evaluation: PACU Anesthesia Type: General Level of consciousness: awake and alert Pain management: pain level controlled Vital Signs Assessment: post-procedure vital signs reviewed and stable Respiratory status: spontaneous breathing, nonlabored ventilation, respiratory function stable and patient connected to nasal cannula oxygen Cardiovascular status: blood pressure returned to baseline and stable Postop Assessment: no signs of nausea or vomiting Anesthetic complications: no     Last Vitals:  Vitals:   06/05/16 1545 06/05/16 1600  BP: (!) 148/81 (!) 156/88  Pulse: 65 66  Resp: 15 16  Temp:  36.7 C    Last Pain:  Vitals:   06/05/16 1600  TempSrc:   PainSc: 2    Pain Goal: Patients Stated Pain Goal: 3 (06/05/16 1600)               [name] JENNETTE

## 2016-06-05 NOTE — Anesthesia Procedure Notes (Signed)
Procedure Name: LMA Insertion Date/Time: 06/05/2016 1:08 PM Performed by: Brock Ra Pre-anesthesia Checklist: Patient identified, Emergency Drugs available, Suction available and Timeout performed Patient Re-evaluated:Patient Re-evaluated prior to inductionOxygen Delivery Method: Circle system utilized Preoxygenation: Pre-oxygenation with 100% oxygen Intubation Type: IV induction Ventilation: Mask ventilation without difficulty LMA: LMA inserted LMA Size: 4.0 Number of attempts: 1 Placement Confirmation: positive ETCO2 and breath sounds checked- equal and bilateral Dental Injury: Teeth and Oropharynx as per pre-operative assessment

## 2016-06-05 NOTE — Discharge Instructions (Signed)
Post Anesthesia Home Care Instructions  Activity: Get plenty of rest for the remainder of the day. A responsible adult should stay with you for 24 hours following the procedure.  For the next 24 hours, DO NOT: -Drive a car -Paediatric nurse -Drink alcoholic beverages -Take any medication unless instructed by your physician -Make any legal decisions or sign important papers.  Meals: Start with liquid foods such as gelatin or soup. Progress to regular foods as tolerated. Avoid greasy, spicy, heavy foods. If nausea and/or vomiting occur, drink only clear liquids until the nausea and/or vomiting subsides. Call your physician if vomiting continues.  Special Instructions/Symptoms: Your throat may feel dry or sore from the anesthesia or the breathing tube placed in your throat during surgery. If this causes discomfort, gargle with warm salt water. The discomfort should disappear within 24 hours.  If you had a scopolamine patch placed behind your ear for the management of post- operative nausea and/or vomiting:  1. The medication in the patch is effective for 72 hours, after which it should be removed.  Wrap patch in a tissue and discard in the trash. Wash hands thoroughly with soap and water. 2. You may remove the patch earlier than 72 hours if you experience unpleasant side effects which may include dry mouth, dizziness or visual disturbances. 3. Avoid touching the patch. Wash your hands with soap and water after contact with the patch.   Hysteroscopy, Care After Refer to this sheet in the next few weeks. These instructions provide you with information on caring for yourself after your procedure. Your health care provider may also give you more specific instructions. Your treatment has been planned according to current medical practices, but problems sometimes occur. Call your health care provider if you have any problems or questions after your procedure.  WHAT TO EXPECT AFTER THE  PROCEDURE After your procedure, it is typical to have the following:  You may have some cramping. This normally lasts for a couple days.  You may have bleeding. This can vary from light spotting for a few days to menstrual-like bleeding for 3-7 days. HOME CARE INSTRUCTIONS  Rest for the first 1-2 days after the procedure.  Only take over-the-counter or prescription medicines as directed by your health care provider. Do not take aspirin. It can increase the chances of bleeding.  Take showers instead of baths for 2 weeks or as directed by your health care provider.  Do not drive for 24 hours or as directed.  Do not drink alcohol while taking pain medicine.  Do not use tampons, douche, or have sexual intercourse for 2 weeks or until your health care provider says it is okay.  Take your temperature twice a day for 4-5 days. Write it down each time.  Follow your health care provider's advice about diet, exercise, and lifting.  If you develop constipation, you may:  Take a mild laxative if your health care provider approves.  Add bran foods to your diet.  Drink enough fluids to keep your urine clear or pale yellow.  Try to have someone with you or available to you for the first 24-48 hours, especially if you were given a general anesthetic.  Follow up with your health care provider as directed. SEEK MEDICAL CARE IF:  You feel dizzy or lightheaded.  You feel sick to your stomach (nauseous).  You have abnormal vaginal discharge.  You have a rash.  You have pain that is not controlled with medicine. SEEK IMMEDIATE MEDICAL CARE IF:  You have bleeding that is heavier than a normal menstrual period.  You have a fever.  You have increasing cramps or pain, not controlled with medicine.  You have new belly (abdominal) pain.  You pass out.  You have pain in the tops of your shoulders (shoulder strap areas).  You have shortness of breath.   This information is not  intended to replace advice given to you by your health care provider. Make sure you discuss any questions you have with your health care provider.   Document Released: 07/26/2013 Document Reviewed: 07/26/2013 Elsevier Interactive Patient Education [employer].

## 2016-06-05 NOTE — Op Note (Signed)
06/05/2016  2:06 PM  PATIENT:  [name]  60 y.o. female  PRE-OPERATIVE DIAGNOSIS:  Abnormal Uterine and vaginal bleeding  POST-OPERATIVE DIAGNOSIS: same  PROCEDURE:  Procedure(s): DILATATION & CURETTAGE/HYSTEROSCOPY  SURGEON:  Iram Lundberg P, MD  ASSISTANTS: None  ANESTHESIA:   general and paracervical block  ESTIMATED BLOOD LOSS: Minimal  BLOOD ADMINISTERED:none  COMPLICATIONS: None  FINDINGS: The uterus sounded to 10 cm. The endometrial cavity was somewhat contracted but contains several sub-mucosal areas that were consistent with intramural fibroids with an intracavitary component. None of the apparent fibroids. Had significant intracavitary component. An apparent left tubal ostium was visualized. However, there was significant scarring in the endometrial cavity, probably status post previous myomectomy such that tubal ostia could not definitively identified. The mucosal component of the cavity appeared atrophic, and in some areas hyperemic.  FLUID DEFICIT:10cc  LOCAL MEDICATIONS USED:  LIDOCAINE  and Amount: 10 ml  SPECIMEN:  Source of Specimen:  Endometrial curettings  DISPOSITION OF SPECIMEN:  PATHOLOGY  COUNTS:  YES  DESCRIPTION OF PROCEDURE:the patient was taken to the operating room after appropriate identification and placed on the operating table. After the attainment of adequate general anesthesia she was placed in the lithotomy position.  A timeout was performed.  The perineum and vagina were prepped with multiple layers of Betadine. The bladder was emptied with a an in and out catheter. The perineum was draped in sterile field. A gray speculum was placed in the vagina. The cervix was grasped with a single-tooth tenaculum. A paracervical block was achieved with a total of 10 cc of 2% Xylocaine and the 5 and 7:00 positions. The uterus was sounded.  The cervix was then dilated to accommodate the diagnostic hysteroscope. The hysteroscope was used to  evaluate all quadrants of the uterus. Curettage of all quadrants of the uterus was undertaken.  As the speculum was removed and a small area on the anterior vagina with increased reviewed and hyperkalemia was biopsied and sent as a separate specimen. All instruments were then removed from the vagina and the patient was awakened from general anesthesia and taken to the recovery room in satisfactory condition having tolerated the procedure well sponge and instrument counts correct.  PLAN OF CARE: . Discharge home after postanesthesia care  PATIENT DISPOSITION:  PACU - hemodynamically stable.   Delay start of Pharmacological VTE agent (>24hrs) due to surgical blood loss or risk of bleeding:  yes. SCD hose were used during the entire procedure    Lakota Markgraf P, MD 2:06 PM

## 2016-06-08 ENCOUNTER — Encounter (HOSPITAL_COMMUNITY): Payer: Self-pay | Admitting: Obstetrics and Gynecology

## 2016-06-11 ENCOUNTER — Encounter: Payer: Self-pay | Admitting: Gynecologic Oncology

## 2016-06-11 ENCOUNTER — Ambulatory Visit: Payer: 59 | Attending: Gynecologic Oncology | Admitting: Gynecologic Oncology

## 2016-06-11 DIAGNOSIS — Z888 Allergy status to other drugs, medicaments and biological substances status: Secondary | ICD-10-CM | POA: Diagnosis not present

## 2016-06-11 DIAGNOSIS — Z96652 Presence of left artificial knee joint: Secondary | ICD-10-CM | POA: Insufficient documentation

## 2016-06-11 DIAGNOSIS — Z8249 Family history of ischemic heart disease and other diseases of the circulatory system: Secondary | ICD-10-CM | POA: Diagnosis not present

## 2016-06-11 DIAGNOSIS — Z885 Allergy status to narcotic agent status: Secondary | ICD-10-CM | POA: Insufficient documentation

## 2016-06-11 DIAGNOSIS — Z7984 Long term (current) use of oral hypoglycemic drugs: Secondary | ICD-10-CM | POA: Diagnosis not present

## 2016-06-11 DIAGNOSIS — D649 Anemia, unspecified: Secondary | ICD-10-CM | POA: Diagnosis not present

## 2016-06-11 DIAGNOSIS — Z811 Family history of alcohol abuse and dependence: Secondary | ICD-10-CM | POA: Diagnosis not present

## 2016-06-11 DIAGNOSIS — Z833 Family history of diabetes mellitus: Secondary | ICD-10-CM | POA: Insufficient documentation

## 2016-06-11 DIAGNOSIS — Z6841 Body Mass Index (BMI) 40.0 and over, adult: Secondary | ICD-10-CM

## 2016-06-11 DIAGNOSIS — E119 Type 2 diabetes mellitus without complications: Secondary | ICD-10-CM | POA: Diagnosis not present

## 2016-06-11 DIAGNOSIS — Z86010 Personal history of colonic polyps: Secondary | ICD-10-CM | POA: Diagnosis not present

## 2016-06-11 DIAGNOSIS — Z800 Family history of malignant neoplasm of digestive organs: Secondary | ICD-10-CM | POA: Diagnosis not present

## 2016-06-11 DIAGNOSIS — Z8619 Personal history of other infectious and parasitic diseases: Secondary | ICD-10-CM | POA: Insufficient documentation

## 2016-06-11 DIAGNOSIS — E785 Hyperlipidemia, unspecified: Secondary | ICD-10-CM | POA: Diagnosis not present

## 2016-06-11 DIAGNOSIS — I10 Essential (primary) hypertension: Secondary | ICD-10-CM | POA: Diagnosis not present

## 2016-06-11 DIAGNOSIS — K429 Umbilical hernia without obstruction or gangrene: Secondary | ICD-10-CM

## 2016-06-11 DIAGNOSIS — Z823 Family history of stroke: Secondary | ICD-10-CM | POA: Insufficient documentation

## 2016-06-11 DIAGNOSIS — G4733 Obstructive sleep apnea (adult) (pediatric): Secondary | ICD-10-CM

## 2016-06-11 DIAGNOSIS — Z803 Family history of malignant neoplasm of breast: Secondary | ICD-10-CM | POA: Diagnosis not present

## 2016-06-11 DIAGNOSIS — Z853 Personal history of malignant neoplasm of breast: Secondary | ICD-10-CM | POA: Insufficient documentation

## 2016-06-11 DIAGNOSIS — C541 Malignant neoplasm of endometrium: Secondary | ICD-10-CM

## 2016-06-11 HISTORY — DX: Malignant neoplasm of endometrium: C54.1

## 2016-06-11 NOTE — Progress Notes (Signed)
Consult Note: Gyn-Onc  Consult was requested by Dr. Leo Grosser for the evaluation of [name] 60 y.o. female  CC:  Chief Complaint  Patient presents with  . endometrial cancer    new patient    Assessment/Plan:  [name]  is a 60 y.o.  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  I performed a history, physical examination, and personally reviewed the patient's imaging films including CT scans from April, 13th, 2017.  A detailed discussion was held with the patient and her family with regard to to her endometrial cancer diagnosis. We discussed the standard management options for uterine cancer which includes surgery followed possibly by adjuvant therapy depending on the results of surgery. The options for surgical management include a hysterectomy and removal of the tubes and ovaries possibly with removal of pelvic and para-aortic lymph nodes. A minimally invasive approach including a robotic hysterectomy or laparoscopic hysterectomy have benefits including shorter hospital stay, recovery time and better wound healing. The alternative approach is an open hysterectomy. The patient has been counseled about these surgical options and the risks of surgery in general including infection, bleeding, damage to surrounding structures (including bowel, bladder, ureters, nerves or vessels), and the postoperative risks of PE/ DVT, and lymphedema. I extensively reviewed the additional risks of robotic hysterectomy including possible need for conversion to open laparotomy.  I discussed positioning during surgery of trendelenberg and risks of minor facial swelling and care we take in preoperative positioning.  I explained that she is at increased risk for these complications due to her extreme morbid obesity. After counseling and consideration of her options, she desires to proceed with robotic  assisted total hysterectomy, BSO, SLN biopsy and umbilical hernia repair and minilap for specimen delivery.   She will be seen by anesthesia for preoperative clearance and discussion of postoperative pain management.  She was given the opportunity to ask questions, which were answered to her satisfaction, and she is agreement with the above mentioned plan of care.  HPI: [name] is a 60 year old morbidly obese (BMI 83) year old woman who is seen in consultation at the request of Dr Leo Grosser.for grade 1 endometrial cancer.  The patient reports having vaginal bleeding symptoms for more than one year. In the fall of 2016 it was extremely heavy, though she declined endometrial sampling (though it had been recommended). She continued to have vaginal bleeding symptoms in 2017 and on 06/05/16 she was taken to the OR by Dr Leo Grosser for a hysteroscopy and D&C. Final pathology revealed grade 1 endometrial adenocarcinoma.   The patient has a history significant for ER/PR positive breast cancer in 2012. She was treated with surgery, radiation and Taxoxifen which she only took for 3 years.  She is morbidly obese with comorbidities including OSA, DM, HTN. 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  Her abdminal surgery history is significant for an abdominal myomectomy in 2003.   She was seen in the ER in April, 2017 for abdominal pain. CT abdomen and pelvis at that time showed a tiny 1.5cm fat containing umbilical hernia.  Current Meds:  Outpatient Encounter Prescriptions as of 06/11/2016  Medication Sig  . calcium carbonate (OS-CAL - DOSED IN MG OF ELEMENTAL CALCIUM) 1250 MG tablet Take 1 tablet by mouth daily.   [name]  ibuprofen (ADVIL,MOTRIN) 600 MG tablet Ibuprofen 600 mg tablets 1 tablet by mouth every 6 hours for 3 days around-the-clock and then 1 tablet every 6 hours as needed for pain  . metFORMIN (GLUCOPHAGE-XR) 500 MG 24 hr  tablet TAKE 1 TABLET BY MOUTH EVERY DAY WITH BREAKFAST (Patient taking differently: TAKE 500 MG MOUTH EVERY DAY WITH BREAKFAST)  . tacrolimus (PROTOPIC) 0.1 % ointment Apply 1 application topically daily.  [name] telmisartan-hydrochlorothiazide (MICARDIS HCT) 80-12.5 MG tablet TAKE 1 TABLET EVERY DAY  . [DISCONTINUED] methocarbamol (ROBAXIN) 500 MG tablet Take 2 tablets (1,000 mg total) by mouth every 8 (eight) hours as needed for muscle spasms. (Patient not taking: Reported on 05/25/2016)   No facility-administered encounter medications on file as of 06/11/2016.     Allergy:  Allergies  Allergen Reactions  . Codeine Nausea Only  . Cymbalta [Duloxetine Hcl] Other (See Comments)    Head felt funny, ? thinking not right  . Darvon Nausea Only  . Hydrocodone Nausea Only  . Oxycodone Nausea Only  . Propoxyphene Hcl Nausea Only    Social Hx:   Social History   Social History  . Marital status: Divorced    Spouse name: N/A  . Number of children: 0  . Years of education: N/A   Occupational History  . 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 Smoking status: Never Smoker  . Smokeless tobacco: Never Used  . Alcohol use Yes     Comment: occasionally drinks wine  . Drug use: No  . Sexual activity: Not Currently   Other Topics Concern  . Not on file   Social History Narrative   1 son deceased with homicide   Patient has been divorced twice    Past Surgical Hx:  Past Surgical History:  Procedure Laterality Date  . AXILLARY LYMPH NODE DISSECTION    . BREAST CYST EXCISION  1973  . BREAST LUMPECTOMY    . BREAST LUMPECTOMY WITH NEEDLE LOCALIZATION Right 12/20/2013   Procedure: EXCISION RIGHT BREAST MASS WITH NEEDLE LOCALIZATION;  Surgeon: Stark Klein, MD;  Location: [facility];  Service: General;  Laterality: Right;  . CESAREAN SECTION    . COLONOSCOPY    . DILATATION & CURRETTAGE/HYSTEROSCOPY WITH RESECTOCOPE N/A 06/05/2016   Procedure: DILATATION &  CURETTAGE/HYSTEROSCOPY;  Surgeon: Eldred Manges, MD;  Location: [facility];  Service: Gynecology;  Laterality: N/A;  . DILATION AND CURETTAGE OF UTERUS    . left achilles tendon repair    . right achilles tendon     and left  . right ovarian cyst     hx  . s/p ear surgury    . s/p extra uterine fibroid  2006  . s/p left knee replacement  2007  . UTERINE FIBROID SURGERY  2006    Past Medical Hx:  Past Medical History:  Diagnosis Date  . Abscess of buttock   . Allergy   . Anemia   . Arthritis    back  . Bacterial infection   . Boil of buttock   . Breast cancer ([facility])    2012  . COLONIC POLYPS, HX OF 05/11/2008  . Diabetes mellitus without complication ([facility])   . Dysrhythmia    patient denies 05/25/2016  . Eczema   . Endometrial cancer ([facility]) 06/11/2016  . GENITAL HERPES, HX OF 08/08/2009  . H/O gonorrhea   . H/O hiatal hernia   . H/O irritable bowel syndrome   . Headache    "shooting pains"  left side of head MRI done 2016 (negative results)  . Hernia   . HYPERLIPIDEMIA 05/11/2008   Pt denies  . Hypertension   . 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 Incontinence in female   . Inverted nipple   . LLQ pain   . Low iron   . Menorrhagia   . OBSTRUCTIVE SLEEP APNEA 05/11/2008   not using CPAP at this time  . Occasional numbness/prickling/tingling of fingers and toes    right foot, right hand  . RASH-NONVESICULAR 06/29/2008  . Shortness of breath dyspnea    with exertion, not a current issue  . Trichomonas   . Urine frequency     Past Gynecological History:  Cesarean section x 1. Myomectomy. No LMP recorded. Patient is not currently having periods (Reason: Perimenopausal).  Family Hx:  Family History  Problem Relation Age of Onset  . Diabetes Mother   . Hypertension Mother   . Stroke Mother   . Heart disease Father     COPD  . Alcohol abuse Father     ETOH dependence  . Cancer Maternal Aunt     breast  . Colon cancer Neg Hx     Review of  Systems:  Constitutional  Feels well,   ENT Normal appearing ears and nares bilaterally Skin/Breast  No rash, sores, jaundice, itching, dryness Cardiovascular  No chest pain, shortness of breath, or edema  Pulmonary  No cough or wheeze.  Gastro Intestinal  No nausea, vomitting, or diarrhoea. No bright red blood per rectum, no abdominal pain, change in bowel movement, or constipation.  Genito Urinary  No frequency, urgency, dysuria, Musculo Skeletal  No myalgia, arthralgia, joint swelling or pain  Neurologic  No weakness, numbness, change in gait,  Psychology  No depression, anxiety, insomnia.   Vitals:  Weight 213lbs, BP 142/78, temp 98.2, pulse ox 100% Physical Exam: WD in NAD Neck  Supple NROM, without any enlargements.  Lymph Node Survey No cervical supraclavicular or inguinal adenopathy Cardiovascular  Pulse normal rate, regularity and rhythm. S1 and S2 normal.  Lungs  Clear to auscultation bilateraly, without wheezes/crackles/rhonchi. Good air movement.  Skin  No rash/lesions/breakdown  Psychiatry  Alert and oriented to person, place, and time  Abdomen  Normoactive bowel sounds, abdomen soft, non-tender and obese without evidence of hernia.  Back No CVA tenderness Genito Urinary  Vulva/vagina: Normal external female genitalia.  No lesions. No discharge or bleeding.  Bladder/urethra:  No lesions or masses, well supported bladder  Vagina: normal  Cervix: Normal appearing, no lesions.  Uterus: Bulky, 12cm, mobile, no parametrial involvement or nodularity.  Adnexa: no palpable masses. Rectal  deferred Extremities  No bilateral cyanosis, clubbing or edema.   [name], MD  06/11/2016, 4:08 PM

## 2016-06-11 NOTE — Patient Instructions (Signed)
Preparing for your Surgery  Plan for surgery on August 29 or September 5 with Dr. Everitt Amber at [facility] will be scheduled for a robotic assisted total hysterectomy, bilateral salpingo-oophorectomy, umbilical hernia repair, sentinel lymph node biopsy, possible mini laparotomy.  Please call [name] at [phone] tomorrow morning when you have decided on a date.  Pre-operative Testing -You will receive a phone call from presurgical testing at [facility] to arrange for a pre-operative testing appointment before your surgery.  This appointment normally occurs one to two weeks before your scheduled surgery.   -Bring your insurance card, copy of an advanced directive if applicable, medication list  -At that visit, you will be asked to sign a consent for a possible blood transfusion in case a transfusion becomes necessary during surgery.  The need for a blood transfusion is rare but having consent is a necessary part of your care.     -You should not be taking blood thinners or aspirin at least ten days prior to surgery unless instructed by your surgeon.  Day Before Surgery at [facility] will be asked to take in a light diet the day before surgery.  Avoid carbonated beverages.  You will be advised to have nothing to eat or drink after midnight the evening before.     Eat a light diet the day before surgery.  Examples including soups, broths, toast, yogurt, mashed potatoes.  Things to avoid include carbonated beverages (fizzy beverages), raw fruits and raw vegetables, or beans.    If your bowels are filled with gas, your surgeon will have difficulty visualizing your pelvic organs which increases your surgical risks.  Your role in recovery Your role is to become active as soon as directed by your doctor, while still giving yourself time to heal.  Rest when you feel tired. You will be asked to do the following in order to speed your recovery:  - Cough and  breathe deeply. This helps toclear and expand your lungs and can prevent pneumonia. You may be given a spirometer to practice deep breathing. A staff member will show you how to use the spirometer. - Do mild physical activity. Walking or moving your legs help your circulation and body functions return to normal. A staff member will help you when you try to walk and will provide you with simple exercises. Do not try to get up or walk alone the first time. - Actively manage your pain. Managing your pain lets you move in comfort. We will ask you to rate your pain on a scale of zero to 10. It is your responsibility to tell your doctor or nurse where and how much you hurt so your pain can be treated.  Special Considerations -If you are diabetic, you may be placed on insulin after surgery to have closer control over your blood sugars to promote healing and recovery.  This does not mean that you will be discharged on insulin.  If applicable, your oral antidiabetics will be resumed when you are tolerating a solid diet.  -Your final pathology results from surgery should be available by the Friday after surgery and the results will be relayed to you when available.   Blood Transfusion Information WHAT IS A BLOOD TRANSFUSION? A transfusion is the replacement of blood or some of its parts. Blood is made up of multiple cells which provide different functions.  Red blood cells carry oxygen and are used for blood loss replacement.  White blood cells fight  against infection.  Platelets control bleeding.  Plasma helps clot blood.  Other blood products are available for specialized needs, such as hemophilia or other clotting disorders. BEFORE THE TRANSFUSION  Who gives blood for transfusions?   You may be able to donate blood to be used at a later date on yourself (autologous donation).  Relatives can be asked to donate blood. This is generally not any safer than if you have received blood from a stranger.  The same precautions are taken to ensure safety when a relative's blood is donated.  Healthy volunteers who are fully evaluated to make sure their blood is safe. This is blood bank blood. Transfusion therapy is the safest it has ever been in the practice of medicine. Before blood is taken from a donor, a complete history is taken to make sure that person has no history of diseases nor engages in risky social behavior (examples are intravenous drug use or sexual activity with multiple partners). The donor's travel history is screened to minimize risk of transmitting infections, such as malaria. The donated blood is tested for signs of infectious diseases, such as HIV and hepatitis. The blood is then tested to be sure it is compatible with you in order to minimize the chance of a transfusion reaction. If you or a relative donates blood, this is often done in anticipation of surgery and is not appropriate for emergency situations. It takes many days to process the donated blood. RISKS AND COMPLICATIONS Although transfusion therapy is very safe and saves many lives, the main dangers of transfusion include:   Getting an infectious disease.  Developing a transfusion reaction. This is an allergic reaction to something in the blood you were given. Every precaution is taken to prevent this. The decision to have a blood transfusion has been considered carefully by your caregiver before blood is given. Blood is not given unless the benefits outweigh the risks.

## 2016-06-12 ENCOUNTER — Telehealth: Payer: Self-pay | Admitting: Gynecologic Oncology

## 2016-06-12 NOTE — Patient Instructions (Addendum)
[name]  06/12/2016   Your procedure is scheduled on: 06/16/2016    Report to [name] Main  Entrance take [place]  elevators to 3rd floor to  [place] at   1130 AM.  Call this number if you have problems the morning of surgery [phone]   Remember: ONLY 1 PERSON MAY GO WITH YOU TO SHORT STAY TO GET  READY MORNING OF [name].  Eat a light diet the day before surgery.  Examples include:  Soups, broths, toast , yogurt  And mashed potatoes.  Aviod raw fruits , vegetable and beans.  You have have clear liquids from 12 midnite the nite before surgery until 0700am morning of surgery then nothing by mouth.       Take these medicines the morning of surgery with A SIP OF WATER: none  DO NOT TAKE ANY DIABETIC MEDICATIONS DAY OF YOUR SURGERY                               You may not have any metal on your body including hair pins and              piercings  Do not wear jewelry, make-up, lotions, powders or perfumes, deodorant             Do not wear nail polish.  Do not shave  48 hours prior to surgery.                Do not bring valuables to the hospital. [place].  Contacts, dentures or bridgework may not be worn into surgery.  Leave suitcase in the car. After surgery it may be brought to your room.        Special Instructions: coughing and deep breathing exercises, leg exercises               Please read over the following fact sheets you were given: _____________________________________________________________________             [facility] - Preparing for Surgery Before surgery, you can play an important role.  Because skin is not sterile, your skin needs to be as free of germs as possible.  You can reduce the number of germs on your skin by washing with CHG (chlorahexidine gluconate) soap before surgery.  CHG is an antiseptic cleaner which kills germs and bonds with the skin to  continue killing germs even after washing. Please DO NOT use if you have an allergy to CHG or antibacterial soaps.  If your skin becomes reddened/irritated stop using the CHG and inform your nurse when you arrive at Short Stay. Do not shave (including legs and underarms) for at least 48 hours prior to the first CHG shower.  You may shave your face/neck. Please follow these instructions carefully:  1.  Shower with CHG Soap the night before surgery and the  morning of Surgery.  2.  If you choose to wash your hair, wash your hair first as usual with your  normal  shampoo.  3.  After you shampoo, rinse your hair and body thoroughly to remove the  shampoo.  4.  Use CHG as you would any other liquid soap.  You can apply chg directly  to the skin and wash                       Gently with a scrungie or clean washcloth.  5.  Apply the CHG Soap to your body ONLY FROM THE NECK DOWN.   Do not use on face/ open                           Wound or open sores. Avoid contact with eyes, ears mouth and genitals (private parts).                       Wash face,  Genitals (private parts) with your normal soap.             6.  Wash thoroughly, paying special attention to the area where your surgery  will be performed.  7.  Thoroughly rinse your body with warm water from the neck down.  8.  DO NOT shower/wash with your normal soap after using and rinsing off  the CHG Soap.                9.  Pat yourself dry with a clean towel.            10.  Wear clean pajamas.            11.  Place clean sheets on your bed the night of your first shower and do not  sleep with pets. Day of Surgery : Do not apply any lotions/deodorants the morning of surgery.  Please wear clean clothes to the hospital/surgery center.  FAILURE TO FOLLOW THESE INSTRUCTIONS MAY RESULT IN THE CANCELLATION OF YOUR SURGERY PATIENT SIGNATURE_________________________________  NURSE  SIGNATURE__________________________________  ________________________________________________________________________  WHAT IS A BLOOD TRANSFUSION? Blood Transfusion Information  A transfusion is the replacement of blood or some of its parts. Blood is made up of multiple cells which provide different functions.  Red blood cells carry oxygen and are used for blood loss replacement.  White blood cells fight against infection.  Platelets control bleeding.  Plasma helps clot blood.  Other blood products are available for specialized needs, such as hemophilia or other clotting disorders. BEFORE THE TRANSFUSION  Who gives blood for transfusions?   Healthy volunteers who are fully evaluated to make sure their blood is safe. This is blood bank blood. Transfusion therapy is the safest it has ever been in the practice of medicine. Before blood is taken from a donor, a complete history is taken to make sure that person has no history of diseases nor engages in risky social behavior (examples are intravenous drug use or sexual activity with multiple partners). The donor's travel history is screened to minimize risk of transmitting infections, such as malaria. The donated blood is tested for signs of infectious diseases, such as HIV and hepatitis. The blood is then tested to be sure it is compatible with you in order to minimize the chance of a transfusion reaction. If you or a relative donates blood, this is often done in anticipation of surgery and is not appropriate for emergency situations. It takes many days to process the donated blood. RISKS AND COMPLICATIONS Although transfusion therapy is very safe and saves many lives, the main dangers of transfusion include:   Getting an infectious disease.  Developing a transfusion reaction. This  is an allergic reaction to something in the blood you were given. Every precaution is taken to prevent this. The decision to have a blood transfusion has been  considered carefully by your caregiver before blood is given. Blood is not given unless the benefits outweigh the risks. AFTER THE TRANSFUSION  Right after receiving a blood transfusion, you will usually feel much better and more energetic. This is especially true if your red blood cells have gotten low (anemic). The transfusion raises the level of the red blood cells which carry oxygen, and this usually causes an energy increase.  The nurse administering the transfusion will monitor you carefully for complications. HOME CARE INSTRUCTIONS  No special instructions are needed after a transfusion. You may find your energy is better. Speak with your caregiver about any limitations on activity for underlying diseases you may have. SEEK MEDICAL CARE IF:   Your condition is not improving after your transfusion.  You develop redness or irritation at the intravenous (IV) site. SEEK IMMEDIATE MEDICAL CARE IF:  Any of the following symptoms occur over the next 12 hours:  Shaking chills.  You have a temperature by mouth above 102 F (38.9 C), not controlled by medicine.  Chest, back, or muscle pain.  People around you feel you are not acting correctly or are confused.  Shortness of breath or difficulty breathing.  Dizziness and fainting.  You get a rash or develop hives.  You have a decrease in urine output.  Your urine turns a dark color or changes to pink, red, or brown. Any of the following symptoms occur over the next 10 days:  You have a temperature by mouth above 102 F (38.9 C), not controlled by medicine.  Shortness of breath.  Weakness after normal activity.  The white part of the eye turns yellow (jaundice).  You have a decrease in the amount of urine or are urinating less often.  Your urine turns a dark color or changes to pink, red, or brown. Document Released: 10/02/2000 Document Revised: 12/28/2011 Document Reviewed: 05/21/2008 ExitCare Patient Information 2014  [place].  _______________________________________________________________________  Incentive Spirometer  An incentive spirometer is a tool that can help keep your lungs clear and active. This tool measures how well you are filling your lungs with each breath. Taking long deep breaths may help reverse or decrease the chance of developing breathing (pulmonary) problems (especially infection) following:  A long period of time when you are unable to move or be active. BEFORE THE PROCEDURE   If the spirometer includes an indicator to show your best effort, your nurse or respiratory therapist will set it to a desired goal.  If possible, sit up straight or lean slightly forward. Try not to slouch.  Hold the incentive spirometer in an upright position. INSTRUCTIONS FOR USE  1. Sit on the edge of your bed if possible, or sit up as far as you can in bed or on a chair. 2. Hold the incentive spirometer in an upright position. 3. Breathe out normally. 4. Place the mouthpiece in your mouth and seal your lips tightly around it. 5. Breathe in slowly and as deeply as possible, raising the piston or the ball toward the top of the column. 6. Hold your breath for 3-5 seconds or for as long as possible. Allow the piston or ball to fall to the bottom of the column. 7. Remove the mouthpiece from your mouth and breathe out normally. 8. Rest for a few seconds and repeat Steps 1  through 7 at least 10 times every 1-2 hours when you are awake. Take your time and take a few normal breaths between deep breaths. 9. The spirometer may include an indicator to show your best effort. Use the indicator as a goal to work toward during each repetition. 10. After each set of 10 deep breaths, practice coughing to be sure your lungs are clear. If you have an incision (the cut made at the time of surgery), support your incision when coughing by placing a pillow or rolled up towels firmly against it. Once you are able to get  out of bed, walk around indoors and cough well. You may stop using the incentive spirometer when instructed by your caregiver.  RISKS AND COMPLICATIONS  Take your time so you do not get dizzy or light-headed.  If you are in pain, you may need to take or ask for pain medication before doing incentive spirometry. It is harder to take a deep breath if you are having pain. AFTER USE  Rest and breathe slowly and easily.  It can be helpful to keep track of a log of your progress. Your caregiver can provide you with a simple table to help with this. If you are using the spirometer at home, follow these instructions: [place] IF:   You are having difficultly using the spirometer.  You have trouble using the spirometer as often as instructed.  Your pain medication is not giving enough relief while using the spirometer.  You develop fever of 100.5 F (38.1 C) or higher. SEEK IMMEDIATE MEDICAL CARE IF:   You cough up bloody sputum that had not been present before.  You develop fever of 102 F (38.9 C) or greater.  You develop worsening pain at or near the incision site. MAKE SURE YOU:   Understand these instructions.  Will watch your condition.  Will get help right away if you are not doing well or get worse. Document Released: 02/15/2007 Document Revised: 12/28/2011 Document Reviewed: 04/18/2007 ExitCare Patient Information 2014 ExitCare, [place].   ________________________________________________________________________    CLEAR LIQUID DIET   Foods Allowed                                                                     Foods Excluded  Coffee and tea, regular and decaf                             liquids that you cannot  Plain Jell-O in any flavor                                             see through such as: Fruit ices (not with fruit pulp)                                     milk, soups, orange juice  Iced Popsicles  All  solid food Carbonated beverages, regular and diet                                    Cranberry, grape and apple juices Sports drinks like Gatorade Lightly seasoned clear broth or consume(fat free) Sugar, honey syrup  Sample Menu Breakfast                                Lunch                                     Supper Cranberry juice                    Beef broth                            Chicken broth Jell-O                                     Grape juice                           Apple juice Coffee or tea                        Jell-O                                      Popsicle                                                Coffee or tea                        Coffee or tea  _____________________________________________________________________

## 2016-06-12 NOTE — Telephone Encounter (Signed)
Patient had called earlier this am stating she would like to proceed with surgery on Tues, Aug 29.  Surgery scheduled.  Patient advised she would receive a phone call from pre-surgical testing to have her come in on Monday for pre-surgical testing.  Advised to call for any needs.

## 2016-06-15 ENCOUNTER — Ambulatory Visit (HOSPITAL_COMMUNITY)
Admission: RE | Admit: 2016-06-15 | Discharge: 2016-06-15 | Disposition: A | Discharge status: Home / Self Care | Payer: 59 | Source: Ambulatory Visit | Attending: Gynecologic Oncology | Admitting: Gynecologic Oncology

## 2016-06-15 ENCOUNTER — Encounter (HOSPITAL_COMMUNITY)
Admission: RE | Admit: 2016-06-15 | Discharge: 2016-06-15 | Disposition: A | Discharge status: Home / Self Care | Payer: 59 | Source: Ambulatory Visit | Attending: Gynecologic Oncology | Admitting: Gynecologic Oncology

## 2016-06-15 ENCOUNTER — Encounter (HOSPITAL_COMMUNITY): Payer: Self-pay

## 2016-06-15 DIAGNOSIS — C541 Malignant neoplasm of endometrium: Secondary | ICD-10-CM | POA: Insufficient documentation

## 2016-06-15 HISTORY — DX: Other injury of unspecified body region, initial encounter: T14.8XXA

## 2016-06-15 LAB — URINALYSIS, ROUTINE W REFLEX MICROSCOPIC
Bilirubin Urine: NEGATIVE
Glucose, UA: NEGATIVE mg/dL
Ketones, ur: NEGATIVE mg/dL
Leukocytes, UA: NEGATIVE
Nitrite: NEGATIVE
Protein, ur: NEGATIVE mg/dL
Specific Gravity, Urine: 1.028 (ref 1.005–1.030)
pH: 5 (ref 5.0–8.0)

## 2016-06-15 LAB — CBC WITH DIFFERENTIAL/PLATELET
Basophils Absolute: 0 10*3/uL (ref 0.0–0.1)
Basophils Relative: 0 %
Eosinophils Absolute: 0.1 10*3/uL (ref 0.0–0.7)
Eosinophils Relative: 2 %
HCT: 36.6 % (ref 36.0–46.0)
Hemoglobin: 11.7 g/dL — ABNORMAL LOW (ref 12.0–15.0)
Lymphocytes Relative: 32 %
Lymphs Abs: 1.5 10*3/uL (ref 0.7–4.0)
MCH: 27.6 pg (ref 26.0–34.0)
MCHC: 32 g/dL (ref 30.0–36.0)
MCV: 86.3 fL (ref 78.0–100.0)
Monocytes Absolute: 0.6 10*3/uL (ref 0.1–1.0)
Monocytes Relative: 12 %
Neutro Abs: 2.7 10*3/uL (ref 1.7–7.7)
Neutrophils Relative %: 54 %
Platelets: 269 10*3/uL (ref 150–400)
RBC: 4.24 MIL/uL (ref 3.87–5.11)
RDW: 14.6 % (ref 11.5–15.5)
WBC: 4.9 10*3/uL (ref 4.0–10.5)

## 2016-06-15 LAB — COMPREHENSIVE METABOLIC PANEL
ALT: 17 U/L (ref 14–54)
AST: 19 U/L (ref 15–41)
Albumin: 4.1 g/dL (ref 3.5–5.0)
Alkaline Phosphatase: 48 U/L (ref 38–126)
Anion gap: 6 (ref 5–15)
BUN: 15 mg/dL (ref 6–20)
CO2: 26 mmol/L (ref 22–32)
Calcium: 9.4 mg/dL (ref 8.9–10.3)
Chloride: 108 mmol/L (ref 101–111)
Creatinine, Ser: 0.61 mg/dL (ref 0.44–1.00)
GFR calc Af Amer: >60 mL/min (ref >60)
GFR calc non Af Amer: >60 mL/min (ref >60)
Glucose, Bld: 88 mg/dL (ref 65–99)
Potassium: 3.5 mmol/L (ref 3.5–5.1)
Sodium: 140 mmol/L (ref 135–145)
Total Bilirubin: 0.4 mg/dL (ref 0.3–1.2)
Total Protein: 8.5 g/dL — ABNORMAL HIGH (ref 6.5–8.1)

## 2016-06-15 LAB — URINE MICROSCOPIC-ADD ON

## 2016-06-15 LAB — HCG, SERUM, QUALITATIVE: Preg, Serum: NEGATIVE

## 2016-06-16 ENCOUNTER — Ambulatory Visit (HOSPITAL_COMMUNITY): Payer: 59 | Admitting: Certified Registered Nurse Anesthetist

## 2016-06-16 ENCOUNTER — Ambulatory Visit (HOSPITAL_COMMUNITY)
Admission: RE | Admit: 2016-06-16 | Discharge: 2016-06-17 | Disposition: A | Discharge status: Home / Self Care | Payer: 59 | Source: Ambulatory Visit | Attending: Gynecologic Oncology | Admitting: Gynecologic Oncology

## 2016-06-16 ENCOUNTER — Encounter (HOSPITAL_COMMUNITY)
Admission: RE | Disposition: A | Discharge status: Home / Self Care | Payer: Self-pay | Source: Ambulatory Visit | Attending: Gynecologic Oncology

## 2016-06-16 ENCOUNTER — Ambulatory Visit: Payer: 59 | Admitting: Dietician

## 2016-06-16 ENCOUNTER — Encounter (HOSPITAL_COMMUNITY): Payer: Self-pay

## 2016-06-16 DIAGNOSIS — C541 Malignant neoplasm of endometrium: Secondary | ICD-10-CM | POA: Diagnosis present

## 2016-06-16 DIAGNOSIS — G4733 Obstructive sleep apnea (adult) (pediatric): Secondary | ICD-10-CM | POA: Insufficient documentation

## 2016-06-16 DIAGNOSIS — Z853 Personal history of malignant neoplasm of breast: Secondary | ICD-10-CM | POA: Diagnosis not present

## 2016-06-16 DIAGNOSIS — Z6841 Body Mass Index (BMI) 40.0 and over, adult: Secondary | ICD-10-CM | POA: Insufficient documentation

## 2016-06-16 DIAGNOSIS — Z79899 Other long term (current) drug therapy: Secondary | ICD-10-CM | POA: Insufficient documentation

## 2016-06-16 DIAGNOSIS — Z923 Personal history of irradiation: Secondary | ICD-10-CM | POA: Insufficient documentation

## 2016-06-16 DIAGNOSIS — Z7984 Long term (current) use of oral hypoglycemic drugs: Secondary | ICD-10-CM | POA: Diagnosis not present

## 2016-06-16 DIAGNOSIS — N9489 Other specified conditions associated with female genital organs and menstrual cycle: Secondary | ICD-10-CM | POA: Insufficient documentation

## 2016-06-16 DIAGNOSIS — I10 Essential (primary) hypertension: Secondary | ICD-10-CM | POA: Insufficient documentation

## 2016-06-16 DIAGNOSIS — K429 Umbilical hernia without obstruction or gangrene: Secondary | ICD-10-CM

## 2016-06-16 DIAGNOSIS — G4730 Sleep apnea, unspecified: Secondary | ICD-10-CM | POA: Insufficient documentation

## 2016-06-16 DIAGNOSIS — D259 Leiomyoma of uterus, unspecified: Secondary | ICD-10-CM | POA: Diagnosis not present

## 2016-06-16 DIAGNOSIS — Z96652 Presence of left artificial knee joint: Secondary | ICD-10-CM | POA: Diagnosis not present

## 2016-06-16 DIAGNOSIS — E119 Type 2 diabetes mellitus without complications: Secondary | ICD-10-CM | POA: Insufficient documentation

## 2016-06-16 HISTORY — PX: ROBOTIC ASSISTED TOTAL HYSTERECTOMY WITH BILATERAL SALPINGO OOPHERECTOMY: SHX6086

## 2016-06-16 LAB — CBC
HCT: 37.3 % (ref 36.0–46.0)
Hemoglobin: 12.8 g/dL (ref 12.0–15.0)
MCH: 28.5 pg (ref 26.0–34.0)
MCHC: 34.3 g/dL (ref 30.0–36.0)
MCV: 83.1 fL (ref 78.0–100.0)
Platelets: 198 10*3/uL (ref 150–400)
RBC: 4.49 MIL/uL (ref 3.87–5.11)
RDW: 14.2 % (ref 11.5–15.5)
WBC: 13.7 10*3/uL — ABNORMAL HIGH (ref 4.0–10.5)

## 2016-06-16 LAB — HEMOGLOBIN A1C
Hgb A1c MFr Bld: 5.7 % — ABNORMAL HIGH (ref 4.8–5.6)
Mean Plasma Glucose: 117 mg/dL

## 2016-06-16 LAB — TYPE AND SCREEN
ABO/RH(D): A POS
Antibody Screen: NEGATIVE

## 2016-06-16 LAB — GLUCOSE, CAPILLARY
Glucose-Capillary: 93 mg/dL (ref 65–99)
Glucose-Capillary: 113 mg/dL — ABNORMAL HIGH (ref 65–99)
Glucose-Capillary: 87 mg/dL (ref 65–99)
Glucose-Capillary: 128 mg/dL — ABNORMAL HIGH (ref 65–99)

## 2016-06-16 LAB — CREATININE, SERUM
Creatinine, Ser: 0.57 mg/dL (ref 0.44–1.00)
GFR calc Af Amer: >60 mL/min (ref >60)
GFR calc non Af Amer: >60 mL/min (ref >60)

## 2016-06-16 SURGERY — HYSTERECTOMY, TOTAL, ROBOT-ASSISTED, LAPAROSCOPIC, WITH BILATERAL SALPINGO-OOPHORECTOMY
Anesthesia: General | Laterality: Bilateral

## 2016-06-16 MED ORDER — IBUPROFEN 800 MG PO TABS: 800.0000 mg | ORAL_TABLET | Freq: Three times a day (TID) | ORAL | Status: DC | PRN

## 2016-06-16 MED ORDER — ONDANSETRON HCL 4 MG/2ML IJ SOLN
4.0000 mg | Freq: Four times a day (QID) | INTRAMUSCULAR | Status: DC | PRN
  Administered 2016-06-16 – 2016-06-17 (×2): 4 mg via INTRAVENOUS
  Filled 2016-06-16 (×2): qty 2

## 2016-06-16 MED ORDER — SUCCINYLCHOLINE CHLORIDE 20 MG/ML IJ SOLN
INTRAMUSCULAR | Status: DC | PRN
  Administered 2016-06-16: 100 mg via INTRAVENOUS

## 2016-06-16 MED ORDER — LACTATED RINGERS IR SOLN
Status: DC | PRN
  Administered 2016-06-16: 1000 mL

## 2016-06-16 MED ORDER — HYDROCHLOROTHIAZIDE 12.5 MG PO CAPS
12.5000 mg | ORAL_CAPSULE | Freq: Every day | ORAL | Status: DC
  Administered 2016-06-17: 12.5 mg via ORAL

## 2016-06-16 MED ORDER — MEPERIDINE HCL 50 MG/ML IJ SOLN
6.2500 mg | INTRAMUSCULAR | Status: DC | PRN
  Administered 2016-06-16: 6.25 mg via INTRAVENOUS

## 2016-06-16 MED ORDER — DEXAMETHASONE SODIUM PHOSPHATE 10 MG/ML IJ SOLN
INTRAMUSCULAR | Status: DC | PRN
  Administered 2016-06-16: 5 mg via INTRAVENOUS

## 2016-06-16 MED ORDER — BUPIVACAINE LIPOSOME 1.3 % IJ SUSP
INTRAMUSCULAR | Status: AC
  Filled 2016-06-16: qty 20

## 2016-06-16 MED ORDER — PROPOFOL 10 MG/ML IV BOLUS
INTRAVENOUS | Status: AC
  Filled 2016-06-16: qty 20

## 2016-06-16 MED ORDER — SUGAMMADEX SODIUM 200 MG/2ML IV SOLN
INTRAVENOUS | Status: DC | PRN
  Administered 2016-06-16: 200 mg via INTRAVENOUS

## 2016-06-16 MED ORDER — HYDROMORPHONE HCL 1 MG/ML IJ SOLN
INTRAMUSCULAR | Status: AC
  Filled 2016-06-16: qty 1

## 2016-06-16 MED ORDER — DEXAMETHASONE SODIUM PHOSPHATE 10 MG/ML IJ SOLN
INTRAMUSCULAR | Status: AC
  Filled 2016-06-16: qty 1

## 2016-06-16 MED ORDER — CEFAZOLIN SODIUM-DEXTROSE 2-4 GM/100ML-% IV SOLN
2.0000 g | INTRAVENOUS | Status: AC
  Administered 2016-06-16: 2 g via INTRAVENOUS

## 2016-06-16 MED ORDER — PROMETHAZINE HCL 25 MG/ML IJ SOLN: 12.5000 mg | Freq: Four times a day (QID) | INTRAMUSCULAR | Status: DC | PRN

## 2016-06-16 MED ORDER — FENTANYL CITRATE (PF) 100 MCG/2ML IJ SOLN
INTRAMUSCULAR | Status: AC
  Filled 2016-06-16: qty 2

## 2016-06-16 MED ORDER — STERILE WATER FOR INJECTION IJ SOLN
INTRAMUSCULAR | Status: DC | PRN
  Administered 2016-06-16: 10 mL

## 2016-06-16 MED ORDER — ONDANSETRON HCL 4 MG/2ML IJ SOLN
INTRAMUSCULAR | Status: DC | PRN
  Administered 2016-06-16: 4 mg via INTRAVENOUS

## 2016-06-16 MED ORDER — FENTANYL CITRATE (PF) 100 MCG/2ML IJ SOLN
INTRAMUSCULAR | Status: DC | PRN
  Administered 2016-06-16 (×4): 50 ug via INTRAVENOUS
  Administered 2016-06-16: 100 ug via INTRAVENOUS
  Administered 2016-06-16: 50 ug via INTRAVENOUS

## 2016-06-16 MED ORDER — ROCURONIUM BROMIDE 100 MG/10ML IV SOLN
INTRAVENOUS | Status: AC
  Filled 2016-06-16: qty 1

## 2016-06-16 MED ORDER — EPHEDRINE SULFATE 50 MG/ML IJ SOLN
INTRAMUSCULAR | Status: DC | PRN
  Administered 2016-06-16: 5 mg via INTRAVENOUS

## 2016-06-16 MED ORDER — LACTATED RINGERS IV SOLN
INTRAVENOUS | Status: DC | PRN
  Administered 2016-06-16 (×2): via INTRAVENOUS

## 2016-06-16 MED ORDER — PROPOFOL 10 MG/ML IV BOLUS
INTRAVENOUS | Status: DC | PRN
  Administered 2016-06-16: 150 mg via INTRAVENOUS
  Administered 2016-06-16: 20 mg via INTRAVENOUS

## 2016-06-16 MED ORDER — ONDANSETRON HCL 4 MG PO TABS: 4.0000 mg | ORAL_TABLET | Freq: Four times a day (QID) | ORAL | Status: DC | PRN

## 2016-06-16 MED ORDER — STERILE WATER FOR INJECTION IJ SOLN
INTRAMUSCULAR | Status: AC
  Filled 2016-06-16: qty 10

## 2016-06-16 MED ORDER — PROMETHAZINE HCL 25 MG/ML IJ SOLN: 6.2500 mg | INTRAMUSCULAR | Status: DC | PRN

## 2016-06-16 MED ORDER — GABAPENTIN 300 MG PO CAPS
600.0000 mg | ORAL_CAPSULE | Freq: Every day | ORAL | Status: AC
  Administered 2016-06-16: 600 mg via ORAL
  Filled 2016-06-16: qty 2

## 2016-06-16 MED ORDER — MIDAZOLAM HCL 2 MG/2ML IJ SOLN
INTRAMUSCULAR | Status: AC
  Filled 2016-06-16: qty 2

## 2016-06-16 MED ORDER — ACETAMINOPHEN 500 MG PO TABS
1000.0000 mg | ORAL_TABLET | Freq: Four times a day (QID) | ORAL | Status: DC
Start: 2016-06-16 — End: 2016-06-17
  Administered 2016-06-16 – 2016-06-17 (×2): 1000 mg via ORAL
  Filled 2016-06-16 (×3): qty 2

## 2016-06-16 MED ORDER — MIDAZOLAM HCL 5 MG/5ML IJ SOLN
INTRAMUSCULAR | Status: DC | PRN
  Administered 2016-06-16: 2 mg via INTRAVENOUS

## 2016-06-16 MED ORDER — MEPERIDINE HCL 50 MG/ML IJ SOLN
INTRAMUSCULAR | Status: AC
  Filled 2016-06-16: qty 1

## 2016-06-16 MED ORDER — KCL IN DEXTROSE-NACL 20-5-0.45 MEQ/L-%-% IV SOLN
INTRAVENOUS | Status: DC
  Administered 2016-06-16: 21:00:00 via INTRAVENOUS
  Filled 2016-06-16: qty 1000

## 2016-06-16 MED ORDER — ENOXAPARIN SODIUM 40 MG/0.4ML ~~LOC~~ SOLN
40.0000 mg | SUBCUTANEOUS | Status: AC
  Administered 2016-06-16: 40 mg via SUBCUTANEOUS
  Filled 2016-06-16: qty 0.4

## 2016-06-16 MED ORDER — TELMISARTAN-HCTZ 80-12.5 MG PO TABS: 1.0000 | ORAL_TABLET | Freq: Every day | ORAL | Status: DC

## 2016-06-16 MED ORDER — BUPIVACAINE LIPOSOME 1.3 % IJ SUSP
INTRAMUSCULAR | Status: DC | PRN
  Administered 2016-06-16: 20 mL

## 2016-06-16 MED ORDER — FENTANYL CITRATE (PF) 100 MCG/2ML IJ SOLN
INTRAMUSCULAR | Status: AC
  Filled 2016-06-16: qty 4

## 2016-06-16 MED ORDER — ROCURONIUM BROMIDE 100 MG/10ML IV SOLN
INTRAVENOUS | Status: DC | PRN
  Administered 2016-06-16 (×5): 10 mg via INTRAVENOUS
  Administered 2016-06-16: 40 mg via INTRAVENOUS

## 2016-06-16 MED ORDER — STERILE WATER FOR IRRIGATION IR SOLN
Status: DC | PRN
  Administered 2016-06-16: 1000 mL

## 2016-06-16 MED ORDER — TRAMADOL HCL 50 MG PO TABS
100.0000 mg | ORAL_TABLET | Freq: Four times a day (QID) | ORAL | Status: DC | PRN
  Administered 2016-06-17: 100 mg via ORAL
  Filled 2016-06-16: qty 2

## 2016-06-16 MED ORDER — ENOXAPARIN SODIUM 40 MG/0.4ML ~~LOC~~ SOLN
40.0000 mg | SUBCUTANEOUS | Status: DC
  Administered 2016-06-17: 40 mg via SUBCUTANEOUS
  Filled 2016-06-16: qty 0.4

## 2016-06-16 MED ORDER — INSULIN ASPART 100 UNIT/ML ~~LOC~~ SOLN: 0.0000 [IU] | Freq: Three times a day (TID) | SUBCUTANEOUS | Status: DC

## 2016-06-16 MED ORDER — LIDOCAINE HCL (CARDIAC) 20 MG/ML IV SOLN
INTRAVENOUS | Status: AC
  Filled 2016-06-16: qty 5

## 2016-06-16 MED ORDER — LIDOCAINE HCL (CARDIAC) 20 MG/ML IV SOLN
INTRAVENOUS | Status: DC | PRN
  Administered 2016-06-16: 50 mg via INTRAVENOUS

## 2016-06-16 MED ORDER — ONDANSETRON HCL 4 MG/2ML IJ SOLN
INTRAMUSCULAR | Status: AC
  Filled 2016-06-16: qty 2

## 2016-06-16 MED ORDER — HYDROMORPHONE HCL 1 MG/ML IJ SOLN
0.2500 mg | INTRAMUSCULAR | Status: DC | PRN
  Administered 2016-06-16 (×3): 0.5 mg via INTRAVENOUS

## 2016-06-16 MED ORDER — GABAPENTIN 600 MG PO TABS
600.0000 mg | ORAL_TABLET | Freq: Every day | ORAL | Status: DC
  Filled 2016-06-16: qty 1

## 2016-06-16 MED ORDER — CEFAZOLIN SODIUM-DEXTROSE 2-4 GM/100ML-% IV SOLN
INTRAVENOUS | Status: AC
  Filled 2016-06-16: qty 100

## 2016-06-16 MED ORDER — IRBESARTAN 300 MG PO TABS
300.0000 mg | ORAL_TABLET | Freq: Every day | ORAL | Status: DC
  Administered 2016-06-17: 300 mg via ORAL
  Filled 2016-06-16: qty 1

## 2016-06-16 MED ORDER — MIDAZOLAM HCL 2 MG/2ML IJ SOLN: 0.5000 mg | Freq: Once | INTRAMUSCULAR | Status: DC | PRN

## 2016-06-16 MED ORDER — HYDROMORPHONE HCL 1 MG/ML IJ SOLN: 0.2000 mg | INTRAMUSCULAR | Status: DC | PRN

## 2016-06-16 SURGICAL SUPPLY — 63 items
APL ESCP 34 STRL LF DISP (HEMOSTASIS)
APPLICATOR SURGIFLO ENDO (HEMOSTASIS) IMPLANT
BAG LAPAROSCOPIC 12 15 PORT 16 (BASKET) ×1 IMPLANT
BAG RETRIEVAL 12/15 (BASKET) ×3
BAG SPEC RTRVL LRG 6X4 10 (ENDOMECHANICALS)
CHLORAPREP W/TINT 26ML (MISCELLANEOUS) ×3 IMPLANT
COVER SURGICAL LIGHT HANDLE (MISCELLANEOUS) ×3 IMPLANT
COVER TIP SHEARS 8 DVNC (MISCELLANEOUS) ×2 IMPLANT
COVER TIP SHEARS 8MM DA VINCI (MISCELLANEOUS) ×1
DRAPE ARM DVNC X/XI (DISPOSABLE) ×8 IMPLANT
DRAPE COLUMN DVNC XI (DISPOSABLE) ×2 IMPLANT
DRAPE DA VINCI XI ARM (DISPOSABLE) ×4
DRAPE DA VINCI XI COLUMN (DISPOSABLE) ×1
DRAPE SHEET LG 3/4 BI-LAMINATE (DRAPES) ×6 IMPLANT
DRAPE SURG IRRIG POUCH 19X23 (DRAPES) ×7 IMPLANT
ELECT PENCIL ROCKER SW 15FT (MISCELLANEOUS) ×2 IMPLANT
ELECT REM PT RETURN 15FT ADLT (MISCELLANEOUS) ×3 IMPLANT
GLOVE BIO SURGEON STRL SZ 6 (GLOVE) ×12 IMPLANT
GLOVE BIO SURGEON STRL SZ 6.5 (GLOVE) ×12 IMPLANT
GOWN STRL REUS W/ TWL LRG LVL3 (GOWN DISPOSABLE) ×6 IMPLANT
GOWN STRL REUS W/TWL LRG LVL3 (GOWN DISPOSABLE) ×12
HOLDER FOLEY CATH W/STRAP (MISCELLANEOUS) ×3 IMPLANT
IRRIG SUCT STRYKERFLOW 2 WTIP (MISCELLANEOUS) ×3
IRRIGATION SUCT STRKRFLW 2 WTP (MISCELLANEOUS) ×2 IMPLANT
KIT BASIN OR (CUSTOM PROCEDURE TRAY) ×3 IMPLANT
KIT PROCEDURE DA VINCI SI (MISCELLANEOUS) ×1
KIT PROCEDURE DVNC SI (MISCELLANEOUS) ×1 IMPLANT
LIQUID BAND (GAUZE/BANDAGES/DRESSINGS) ×3 IMPLANT
MANIPULATOR UTERINE 4.5 ZUMI (MISCELLANEOUS) ×3 IMPLANT
MARKER SKIN DUAL TIP RULER LAB (MISCELLANEOUS) ×3 IMPLANT
NDL SAFETY ECLIPSE 18X1.5 (NEEDLE) ×2 IMPLANT
NDL SPNL 18GX3.5 QUINCKE PK (NEEDLE) ×1 IMPLANT
NEEDLE HYPO 18GX1.5 SHARP (NEEDLE) ×3
NEEDLE SPNL 18GX3.5 QUINCKE PK (NEEDLE) ×3 IMPLANT
OBTURATOR XI 8MM BLADELESS (TROCAR) ×3 IMPLANT
OCCLUDER COLPOPNEUMO (BALLOONS) ×3 IMPLANT
PAD POSITIONING PINK XL (MISCELLANEOUS) ×3 IMPLANT
PORT ACCESS TROCAR AIRSEAL 12 (TROCAR) ×2 IMPLANT
PORT ACCESS TROCAR AIRSEAL 5M (TROCAR) ×1
POUCH SPECIMEN RETRIEVAL 10MM (ENDOMECHANICALS) IMPLANT
SEAL CANN UNIV 5-8 DVNC XI (MISCELLANEOUS) ×8 IMPLANT
SEAL XI 5MM-8MM UNIVERSAL (MISCELLANEOUS) ×4
SET TRI-LUMEN FLTR TB AIRSEAL (TUBING) ×3 IMPLANT
SHEET LAVH (DRAPES) ×3 IMPLANT
SOLUTION ELECTROLUBE (MISCELLANEOUS) ×3 IMPLANT
SURGIFLO W/THROMBIN 8M KIT (HEMOSTASIS) IMPLANT
SUT MNCRL AB 4-0 PS2 18 (SUTURE) ×6 IMPLANT
SUT VIC AB 0 CT1 27 (SUTURE) ×9
SUT VIC AB 0 CT1 27XBRD ANTBC (SUTURE) ×4 IMPLANT
SUT VIC AB 2-0 CT1 27 (SUTURE) ×3
SUT VIC AB 2-0 CT1 TAPERPNT 27 (SUTURE) ×1 IMPLANT
SYR 50ML LL SCALE MARK (SYRINGE) ×3 IMPLANT
SYRINGE 10CC LL (SYRINGE) ×3 IMPLANT
TOWEL OR 17X26 10 PK STRL BLUE (TOWEL DISPOSABLE) ×6 IMPLANT
TOWEL OR NON WOVEN STRL DISP B (DISPOSABLE) ×3 IMPLANT
TRAP SPECIMEN MUCOUS 40CC (MISCELLANEOUS) IMPLANT
TRAY FOLEY W/METER SILVER 14FR (SET/KITS/TRAYS/PACK) ×3 IMPLANT
TRAY LAPAROSCOPIC (CUSTOM PROCEDURE TRAY) ×3 IMPLANT
TROCAR BLADELESS OPT 5 100 (ENDOMECHANICALS) ×3 IMPLANT
UNDERPAD 30X30 (UNDERPADS AND DIAPERS) ×3 IMPLANT
UNDERPAD 30X30 INCONTINENT (UNDERPADS AND DIAPERS) ×3 IMPLANT
WATER STERILE IRR 1500ML POUR (IV SOLUTION) ×2 IMPLANT
YANKAUER SUCT BULB TIP 10FT TU (MISCELLANEOUS) ×2 IMPLANT

## 2016-06-16 NOTE — Interval H&P Note (Signed)
History and Physical Interval Note:  06/16/2016 1:56 PM  [name]  has presented today for surgery, with the diagnosis of endometrial cancer  The various methods of treatment have been discussed with the patient and family. After consideration of risks, benefits and other options for treatment, the patient has consented to  Procedure(s): XI ROBOTIC ASSISTED TOTAL HYSTERECTOMY WITH BILATERAL SALPINGO OOPHORECTOMY AND SENTINAL LYMPH NODE BIOPSY, POSSIBLE MINI LAPAROTOMY (Bilateral) HERNIA REPAIR UMBILICAL ADULT (N/A) as a surgical intervention .  The patient's history has been reviewed, patient examined, no change in status, stable for surgery.  I have reviewed the patient's chart and labs.  Questions were answered to the patient's satisfaction.     [name]

## 2016-06-16 NOTE — Anesthesia Postprocedure Evaluation (Signed)
Anesthesia Post Note  Patient: [name]  Procedure(s) Performed: Procedure(s) (LRB): XI ROBOTIC ASSISTED TOTAL HYSTERECTOMY WITH BILATERAL SALPINGO OOPHORECTOMY AND SENTINAL LYMPH NODE BIOPSY, MINI LAPAROTOMY (Bilateral)  Patient location during evaluation: PACU Anesthesia Type: General Level of consciousness: awake and alert Pain management: pain level controlled Vital Signs Assessment: post-procedure vital signs reviewed and stable Respiratory status: spontaneous breathing, nonlabored ventilation, respiratory function stable and patient connected to nasal cannula oxygen Cardiovascular status: blood pressure returned to baseline and stable Postop Assessment: no signs of nausea or vomiting Anesthetic complications: no    Last Vitals:  Vitals:   06/16/16 1934 06/16/16 2030  BP: (!) 156/93 (!) 153/94  Pulse: 60 68  Resp: 16 16  Temp: 36.7 C 36.7 C    Last Pain:  Vitals:   06/16/16 2030  TempSrc: Axillary  PainSc:                  [name]

## 2016-06-16 NOTE — Progress Notes (Signed)
Pt vomiting minimal amt of clear liq. Paged [place] for more nausea medication. Verbal orders received.

## 2016-06-16 NOTE — Transfer of Care (Signed)
Immediate Anesthesia Transfer of Care Note  Patient: MARESHAH VISGER  Procedure(s) Performed: Procedure(s): XI ROBOTIC ASSISTED TOTAL HYSTERECTOMY WITH BILATERAL SALPINGO OOPHORECTOMY AND SENTINAL LYMPH NODE BIOPSY, MINI LAPAROTOMY (Bilateral)  Patient Location: PACU  Anesthesia Type:General  Level of Consciousness:  sedated, patient cooperative and responds to stimulation  Airway & Oxygen Therapy:Patient Spontanous Breathing and Patient connected to face mask oxgen  Post-op Assessment:  Report given to PACU RN and Post -op Vital signs reviewed and stable  Post vital signs:  Reviewed and stable  Last Vitals:  Vitals:   06/16/16 1710 06/16/16 1715  BP: (!) 156/106   Pulse: 86 81  Resp: 18 17  Temp: [id number] C     Complications: No apparent anesthesia complications

## 2016-06-16 NOTE — H&P (View-Only) (Signed)
Consult Note: Gyn-Onc  Consult was requested by Dr. Leo Grosser for the evaluation of FARAH PEARS 60 y.o. female  CC:  Chief Complaint  Patient presents with  . endometrial cancer    new patient    Assessment/Plan:  Ms. BRIGHTLY DABU  is a 60 y.o.  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  I performed a history, physical examination, and personally reviewed the patient's imaging films including CT scans from April, 13th, 2017.  A detailed discussion was held with the patient and her family with regard to to her endometrial cancer diagnosis. We discussed the standard management options for uterine cancer which includes surgery followed possibly by adjuvant therapy depending on the results of surgery. The options for surgical management include a hysterectomy and removal of the tubes and ovaries possibly with removal of pelvic and para-aortic lymph nodes. A minimally invasive approach including a robotic hysterectomy or laparoscopic hysterectomy have benefits including shorter hospital stay, recovery time and better wound healing. The alternative approach is an open hysterectomy. The patient has been counseled about these surgical options and the risks of surgery in general including infection, bleeding, damage to surrounding structures (including bowel, bladder, ureters, nerves or vessels), and the postoperative risks of PE/ DVT, and lymphedema. I extensively reviewed the additional risks of robotic hysterectomy including possible need for conversion to open laparotomy.  I discussed positioning during surgery of trendelenberg and risks of minor facial swelling and care we take in preoperative positioning.  I explained that she is at increased risk for these complications due to her extreme morbid obesity. After counseling and consideration of her options, she desires to proceed with robotic  assisted total hysterectomy, BSO, SLN biopsy and umbilical hernia repair and minilap for specimen delivery.   She will be seen by anesthesia for preoperative clearance and discussion of postoperative pain management.  She was given the opportunity to ask questions, which were answered to her satisfaction, and she is agreement with the above mentioned plan of care.  HPI: [name] is a 60 year old morbidly obese (BMI 83) year old woman who is seen in consultation at the request of Dr Leo Grosser.for grade 1 endometrial cancer.  The patient reports having vaginal bleeding symptoms for more than one year. In the fall of 2016 it was extremely heavy, though she declined endometrial sampling (though it had been recommended). She continued to have vaginal bleeding symptoms in 2017 and on 06/05/16 she was taken to the OR by Dr Leo Grosser for a hysteroscopy and D&C. Final pathology revealed grade 1 endometrial adenocarcinoma.   The patient has a history significant for ER/PR positive breast cancer in 2012. She was treated with surgery, radiation and Taxoxifen which she only took for 3 years.  She is morbidly obese with comorbidities including OSA, DM, HTN. 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  Her abdminal surgery history is significant for an abdominal myomectomy in 2003.   She was seen in the ER in April, 2017 for abdominal pain. CT abdomen and pelvis at that time showed a tiny 1.5cm fat containing umbilical hernia.  Current Meds:  Outpatient Encounter Prescriptions as of 06/11/2016  Medication Sig  . calcium carbonate (OS-CAL - DOSED IN MG OF ELEMENTAL CALCIUM) 1250 MG tablet Take 1 tablet by mouth daily.   [name]  ibuprofen (ADVIL,MOTRIN) 600 MG tablet Ibuprofen 600 mg tablets 1 tablet by mouth every 6 hours for 3 days around-the-clock and then 1 tablet every 6 hours as needed for pain  . metFORMIN (GLUCOPHAGE-XR) 500 MG 24 hr  tablet TAKE 1 TABLET BY MOUTH EVERY DAY WITH BREAKFAST (Patient taking differently: TAKE 500 MG MOUTH EVERY DAY WITH BREAKFAST)  . tacrolimus (PROTOPIC) 0.1 % ointment Apply 1 application topically daily.  [name] telmisartan-hydrochlorothiazide (MICARDIS HCT) 80-12.5 MG tablet TAKE 1 TABLET EVERY DAY  . [DISCONTINUED] methocarbamol (ROBAXIN) 500 MG tablet Take 2 tablets (1,000 mg total) by mouth every 8 (eight) hours as needed for muscle spasms. (Patient not taking: Reported on 05/25/2016)   No facility-administered encounter medications on file as of 06/11/2016.     Allergy:  Allergies  Allergen Reactions  . Codeine Nausea Only  . Cymbalta [Duloxetine Hcl] Other (See Comments)    Head felt funny, ? thinking not right  . Darvon Nausea Only  . Hydrocodone Nausea Only  . Oxycodone Nausea Only  . Propoxyphene Hcl Nausea Only    Social Hx:   Social History   Social History  . Marital status: Divorced    Spouse name: N/A  . Number of children: 0  . Years of education: N/A   Occupational History  . 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 Smoking status: Never Smoker  . Smokeless tobacco: Never Used  . Alcohol use Yes     Comment: occasionally drinks wine  . Drug use: No  . Sexual activity: Not Currently   Other Topics Concern  . Not on file   Social History Narrative   1 son deceased with homicide   Patient has been divorced twice    Past Surgical Hx:  Past Surgical History:  Procedure Laterality Date  . AXILLARY LYMPH NODE DISSECTION    . BREAST CYST EXCISION  1973  . BREAST LUMPECTOMY    . BREAST LUMPECTOMY WITH NEEDLE LOCALIZATION Right 12/20/2013   Procedure: EXCISION RIGHT BREAST MASS WITH NEEDLE LOCALIZATION;  Surgeon: Stark Klein, MD;  Location: [facility];  Service: General;  Laterality: Right;  . CESAREAN SECTION    . COLONOSCOPY    . DILATATION & CURRETTAGE/HYSTEROSCOPY WITH RESECTOCOPE N/A 06/05/2016   Procedure: DILATATION &  CURETTAGE/HYSTEROSCOPY;  Surgeon: Eldred Manges, MD;  Location: [facility];  Service: Gynecology;  Laterality: N/A;  . DILATION AND CURETTAGE OF UTERUS    . left achilles tendon repair    . right achilles tendon     and left  . right ovarian cyst     hx  . s/p ear surgury    . s/p extra uterine fibroid  2006  . s/p left knee replacement  2007  . UTERINE FIBROID SURGERY  2006    Past Medical Hx:  Past Medical History:  Diagnosis Date  . Abscess of buttock   . Allergy   . Anemia   . Arthritis    back  . Bacterial infection   . Boil of buttock   . Breast cancer ([place])    2012  . COLONIC POLYPS, HX OF 05/11/2008  . Diabetes mellitus without complication ([facility])   . Dysrhythmia    patient denies 05/25/2016  . Eczema   . Endometrial cancer ([facility]) 06/11/2016  . GENITAL HERPES, HX OF 08/08/2009  . H/O gonorrhea   . H/O hiatal hernia   . H/O irritable bowel syndrome   . Headache    "shooting pains"  left side of head MRI done 2016 (negative results)  . Hernia   . HYPERLIPIDEMIA 05/11/2008   Pt denies  . Hypertension   . 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 Incontinence in female   . Inverted nipple   . LLQ pain   . Low iron   . Menorrhagia   . OBSTRUCTIVE SLEEP APNEA 05/11/2008   not using CPAP at this time  . Occasional numbness/prickling/tingling of fingers and toes    right foot, right hand  . RASH-NONVESICULAR 06/29/2008  . Shortness of breath dyspnea    with exertion, not a current issue  . Trichomonas   . Urine frequency     Past Gynecological History:  Cesarean section x 1. Myomectomy. No LMP recorded. Patient is not currently having periods (Reason: Perimenopausal).  Family Hx:  Family History  Problem Relation Age of Onset  . Diabetes Mother   . Hypertension Mother   . Stroke Mother   . Heart disease Father     COPD  . Alcohol abuse Father     ETOH dependence  . Cancer Maternal Aunt     breast  . Colon cancer Neg Hx     Review of  Systems:  Constitutional  Feels well,   ENT Normal appearing ears and nares bilaterally Skin/Breast  No rash, sores, jaundice, itching, dryness Cardiovascular  No chest pain, shortness of breath, or edema  Pulmonary  No cough or wheeze.  Gastro Intestinal  No nausea, vomitting, or diarrhoea. No bright red blood per rectum, no abdominal pain, change in bowel movement, or constipation.  Genito Urinary  No frequency, urgency, dysuria, Musculo Skeletal  No myalgia, arthralgia, joint swelling or pain  Neurologic  No weakness, numbness, change in gait,  Psychology  No depression, anxiety, insomnia.   Vitals:  Weight 213lbs, BP 142/78, temp 98.2, pulse ox 100% Physical Exam: WD in NAD Neck  Supple NROM, without any enlargements.  Lymph Node Survey No cervical supraclavicular or inguinal adenopathy Cardiovascular  Pulse normal rate, regularity and rhythm. S1 and S2 normal.  Lungs  Clear to auscultation bilateraly, without wheezes/crackles/rhonchi. Good air movement.  Skin  No rash/lesions/breakdown  Psychiatry  Alert and oriented to person, place, and time  Abdomen  Normoactive bowel sounds, abdomen soft, non-tender and obese without evidence of hernia.  Back No CVA tenderness Genito Urinary  Vulva/vagina: Normal external female genitalia.  No lesions. No discharge or bleeding.  Bladder/urethra:  No lesions or masses, well supported bladder  Vagina: normal  Cervix: Normal appearing, no lesions.  Uterus: Bulky, 12cm, mobile, no parametrial involvement or nodularity.  Adnexa: no palpable masses. Rectal  deferred Extremities  No bilateral cyanosis, clubbing or edema.   [name], MD  06/11/2016, 4:08 PM

## 2016-06-16 NOTE — Anesthesia Preprocedure Evaluation (Addendum)
Anesthesia Evaluation  Patient identified by MRN, date of birth, ID band Patient awake    Reviewed: Allergy & Precautions, NPO status , Patient's Chart, lab work & pertinent test results  History of Anesthesia Complications Negative for: history of anesthetic complications  Airway Mallampati: II  TM Distance: >3 FB Neck ROM: Full    Dental  (+) Dental Advisory Given, Chipped   Pulmonary shortness of breath, sleep apnea (does not use CPAP) ,    breath sounds clear to auscultation       Cardiovascular hypertension, Pt. on medications (-) angina Rhythm:Regular Rate:Normal  '12 ECHO:  EF 60-65%, mild TR   Neuro/Psych  Headaches,    GI/Hepatic Neg liver ROS, GERD  Controlled,  Endo/Other  diabetes (glu 93), Oral Hypoglycemic AgentsMorbid obesity  Renal/GU negative Renal ROS     Musculoskeletal  (+) Arthritis ,   Abdominal (+) + obese,   Peds  Hematology  (+) Blood dyscrasia (Hb 11.7), ,   Anesthesia Other Findings   Reproductive/Obstetrics                            Anesthesia Physical Anesthesia Plan  ASA: III  Anesthesia Plan: General   Post-op Pain Management:    Induction: Intravenous  Airway Management Planned: Oral ETT  Additional Equipment:   Intra-op Plan:   Post-operative Plan: Extubation in OR  Informed Consent: I have reviewed the patients History and Physical, chart, labs and discussed the procedure including the risks, benefits and alternatives for the proposed anesthesia with the patient or authorized representative who has indicated his/her understanding and acceptance.   Dental advisory given  Plan Discussed with: CRNA and Surgeon  Anesthesia Plan Comments: (Plan routine monitors, GETA)        Anesthesia Quick Evaluation

## 2016-06-16 NOTE — Op Note (Signed)
OPERATIVE NOTE 06/16/16  Surgeon: [name]   Assistants: Dr Lahoma Crocker (an MD assistant was necessary for tissue manipulation, management of robotic instrumentation, retraction and positioning due to the complexity of the case and hospital policies).   Anesthesia: General endotracheal anesthesia  ASA Class: 3   Pre-operative Diagnosis: grade 1 endometrial cancer, uterine fibroids  Post-operative Diagnosis: same  Operation: Robotic-assisted laparoscopic total hysterectomy for uterus >250gm with bilateral salpingoophorectomy, SLN biopsy, minilaparotomy for specimen delivery  Surgeon: [name]  Assistant Surgeon: Lahoma Crocker MD  Anesthesia: GET  Urine Output: 200  Operative Findings:  : 14cm fibroid uterus, no gross extrauterine disease. Adhesions between omentum and uterine fundus. No umbilical hernia appreciated.  Estimated Blood Loss:  200 mL      Total IV Fluids: 1,000 ml         Specimens: uterus, cervix bilateral tubes and ovaries, right external iliac SLN, right common iliac SLN, left obturator SLN, left external iliac SLN.         Complications:  None; patient tolerated the procedure well.         Disposition: PACU - hemodynamically stable.  Procedure Details  The patient was seen in the Holding Room. The risks, benefits, complications, treatment options, and expected outcomes were discussed with the patient.  The patient concurred with the proposed plan, giving informed consent.  The site of surgery properly noted/marked. The patient was identified as Trany Radden and the procedure verified as a Robotic-assisted hysterectomy with bilateral salpingo oophorectomy. A Time Out was held and the above information confirmed.  After induction of anesthesia, the patient was draped and prepped in the usual sterile manner. Pt was placed in supine position after anesthesia and draped and prepped in the usual sterile manner. The abdominal  drape was placed after the CholoraPrep had been allowed to dry for 3 minutes.  Her arms were tucked to her side with all appropriate precautions.  The shoulders were stabilized with padded shoulder blocks applied to the acromium processes.  The patient was placed in the semi-lithotomy position in [place].  The perineum was prepped with Betadine. The patient was then prepped. Foley catheter was placed.  A sterile speculum was placed in the vagina.  The cervix was grasped with a single-tooth tenaculum and dilated with Kennon Rounds dilators.  2mg  total of ICG was injected into the cervical stroma at 2 and 9 o'clock at a 79mm depth (concentration 0.5mg /ml). The ZUMI uterine manipulator with a medium colpotomizer ring was placed without difficulty.  A pneum occluder balloon was placed over the manipulator.  OG tube placement was confirmed and to suction.   Next, a 5 mm skin incision was made 1 cm below the subcostal margin in the midclavicular line.  The 5 mm Optiview port and scope was used for direct entry.  Opening pressure was under 10 mm CO2.  The abdomen was insufflated and the findings were noted as above.   At this point and all points during the procedure, the patient's intra-abdominal pressure did not exceed 15 mmHg. Next, a 10 mm skin incision was made in the umbilicus and a right and left port was placed about 10 cm lateral to the robot port on the right and left side.  A fourth arm was placed in the left lower quadrant 2 cm above and superior and medial to the anterior superior iliac spine.  All ports were placed under direct visualization.  The patient was placed in steep Trendelenburg.  Bowel  was folded away into the upper abdomen.  The robot was docked in the normal manner.  The right and left peritoneum were opened parallel to the IP ligament to open the retroperitoneal spaces bilaterally. The SLN mapping was performed in bilateral pelvic basins. The para rectal and paravesical spaces were opened up.  Lymphatic channels were identified travelling to the following visualized sentinel lymph node's: right external iliac and common iliac, left obturator and external iliac. These SLN's were separated from their surrounding lymphatic tissue, removed and sent for permanent pathology.  The hysterectomy was started after the round ligament on the right side was incised and the retroperitoneum was entered and the pararectal space was developed.  The ureter was noted to be on the medial leaf of the broad ligament.  The peritoneum above the ureter was incised and stretched and the infundibulopelvic ligament was skeletonized, cauterized and cut.  The posterior peritoneum was taken down to the level of the KOH ring.  The anterior peritoneum was also taken down.  The bladder flap was created to the level of the KOH ring.  The uterine artery on the right side was skeletonized, cauterized and cut in the normal manner.  A similar procedure was performed on the left.  The colpotomy was made however the uterine specimen was too large to be extracted in tact through the vagina and therefore it was placed ina 15cm endocatch bag for minilaparotomy retrieval. Pedicles were inspected and excellent hemostasis was achieved.    The colpotomy at the vaginal cuff was closed with Vicryl on a CT1 needle in a running manner.  Irrigation was used and excellent hemostasis was achieved.  At this point in the robotic procedure was completed.  Robotic instruments were removed under direct visulaization.  The robot was undocked.   A 6cm suprapubic transverse incision was made through the skin. The fascia was incised transversely. The fascia was dissected from the rectus bellies. The peritoneum was opened in the midline. The uterine specimen within the bag was removed. The fascia was closed with 0-vicryl. The subcutaneous tissues were closed with vicryl. The subcutaneous tissues were infiltrated with exparel. The 10 mm ports were closed with  Vicryl on a UR-5 needle and the fascia was closed with 0 Vicryl on a UR-5 needle.  The skin was closed with 4-0 Vicryl in a subcuticular manner.  Dermabond was applied.  Sponge, lap and needle counts correct x 2.  The patient was taken to the recovery room in stable condition.  The vagina was swabbed with  minimal bleeding noted.   All instrument and needle counts were correct x  3.   The patient was transferred to the recovery room in a stable condition.  [name], MD

## 2016-06-16 NOTE — Anesthesia Procedure Notes (Signed)
Procedure Name: Intubation Date/Time: 06/16/2016 2:12 PM Performed by: West Pugh Pre-anesthesia Checklist: Patient identified, Emergency Drugs available, Suction available, Patient being monitored and Timeout performed Patient Re-evaluated:Patient Re-evaluated prior to inductionOxygen Delivery Method: Circle system utilized Preoxygenation: Pre-oxygenation with 100% oxygen Intubation Type: IV induction Ventilation: Mask ventilation without difficulty Laryngoscope Size: Mac and 3 Grade View: Grade I Tube type: Oral Tube size: 7.5 mm Number of attempts: 1 Airway Equipment and Method: Stylet Placement Confirmation: ETT inserted through vocal cords under direct vision,  positive ETCO2,  CO2 detector and breath sounds checked- equal and bilateral Secured at: 22 cm Tube secured with: Tape Dental Injury: Teeth and Oropharynx as per pre-operative assessment

## 2016-06-17 ENCOUNTER — Encounter (HOSPITAL_COMMUNITY): Payer: Self-pay | Admitting: Gynecologic Oncology

## 2016-06-17 ENCOUNTER — Ambulatory Visit: Payer: 59 | Admitting: Gynecologic Oncology

## 2016-06-17 DIAGNOSIS — C541 Malignant neoplasm of endometrium: Secondary | ICD-10-CM | POA: Diagnosis not present

## 2016-06-17 LAB — BASIC METABOLIC PANEL
Anion gap: 6 (ref 5–15)
BUN: 8 mg/dL (ref 6–20)
CO2: 27 mmol/L (ref 22–32)
Calcium: 9.4 mg/dL (ref 8.9–10.3)
Chloride: 103 mmol/L (ref 101–111)
Creatinine, Ser: 0.72 mg/dL (ref 0.44–1.00)
GFR calc Af Amer: >60 mL/min (ref >60)
GFR calc non Af Amer: >60 mL/min (ref >60)
Glucose, Bld: 142 mg/dL — ABNORMAL HIGH (ref 65–99)
Potassium: 4 mmol/L (ref 3.5–5.1)
Sodium: 136 mmol/L (ref 135–145)

## 2016-06-17 LAB — CBC
HCT: 35.8 % — ABNORMAL LOW (ref 36.0–46.0)
Hemoglobin: 11.8 g/dL — ABNORMAL LOW (ref 12.0–15.0)
MCH: 27.7 pg (ref 26.0–34.0)
MCHC: 33 g/dL (ref 30.0–36.0)
MCV: 84 fL (ref 78.0–100.0)
Platelets: 275 10*3/uL (ref 150–400)
RBC: 4.26 MIL/uL (ref 3.87–5.11)
RDW: 14.1 % (ref 11.5–15.5)
WBC: 9.6 10*3/uL (ref 4.0–10.5)

## 2016-06-17 LAB — GLUCOSE, CAPILLARY
Glucose-Capillary: 113 mg/dL — ABNORMAL HIGH (ref 65–99)
Glucose-Capillary: 82 mg/dL (ref 65–99)

## 2016-06-17 MED ORDER — TRAMADOL HCL 50 MG PO TABS: 50.0000 mg | ORAL_TABLET | Freq: Four times a day (QID) | ORAL | 0 refills | Status: DC | PRN

## 2016-06-17 NOTE — Discharge Instructions (Signed)
06/17/2016  Return to work: 4-6 weeks if applicable  Activity: 1. Be up and out of the bed during the day.  Take a nap if needed.  You may walk up steps but be careful and use the hand rail.  Stair climbing will tire you more than you think, you may need to stop part way and rest.   2. No lifting or straining for 6 weeks.  3. No driving for 1 week(s).  Do not drive if you are taking narcotic pain medicine.  4. Shower daily.  Use soap and water on your incision and pat dry; don't rub.  No tub baths until cleared by your surgeon.   5. No sexual activity and nothing in the vagina for 8 weeks.  6. You may experience a small amount of clear drainage from your incisions, which is normal.  If the drainage persists or increases, please call the office.   Diet: 1. Low sodium Heart Healthy Diet is recommended.  2. It is safe to use a laxative, such as Miralax or Colace, if you have difficulty moving your bowels.   Wound Care: 1. Keep clean and dry.  Shower daily.  Reasons to call the Doctor:  Fever - Oral temperature greater than 100.4 degrees Fahrenheit  Foul-smelling vaginal discharge  Difficulty urinating  Nausea and vomiting  Increased pain at the site of the incision that is unrelieved with pain medicine.  Difficulty breathing with or without chest pain  New calf pain especially if only on one side  Sudden, continuing increased vaginal bleeding with or without clots.   Contacts: For questions or concerns you should contact:  Dr. Everitt Amber at [phone]  Joylene John, NP at [phone]  After Hours: call [phone] and have the GYN Oncologist paged/contacted  Tramadol tablets What is this medicine? TRAMADOL (TRA ma dole) is a pain reliever. It is used to treat moderate to severe pain in adults. This medicine may be used for other purposes; ask your health care provider or pharmacist if you have questions. What should I tell my health care provider before I  take this medicine? They need to know if you have any of these conditions: -brain tumor -depression -drug abuse or addiction -head injury -if you frequently drink alcohol containing drinks -kidney disease or trouble passing urine -liver disease -lung disease, asthma, or breathing problems -seizures or epilepsy -suicidal thoughts, plans, or attempt; a previous suicide attempt by you or a family member -an unusual or allergic reaction to tramadol, codeine, other medicines, foods, dyes, or preservatives -pregnant or trying to get pregnant -breast-feeding How should I use this medicine? Take this medicine by mouth with a full glass of water. Follow the directions on the prescription label. If the medicine upsets your stomach, take it with food or milk. Do not take more medicine than you are told to take. Talk to your pediatrician regarding the use of this medicine in children. Special care may be needed. Overdosage: If you think you have taken too much of this medicine contact a poison control center or emergency room at once. NOTE: This medicine is only for you. Do not share this medicine with others. What if I miss a dose? If you miss a dose, take it as soon as you can. If it is almost time for your next dose, take only that dose. Do not take double or extra doses. What may interact with this medicine? Do not take this medicine with any of the following medications: -MAOIs like  Carbex, Eldepryl, Marplan, Nardil, and Parnate This medicine may also interact with the following medications: -alcohol or medicines that contain alcohol -antihistamines -benzodiazepines -bupropion -carbamazepine or oxcarbazepine -clozapine -cyclobenzaprine -digoxin -furazolidone -linezolid -medicines for depression, anxiety, or psychotic disturbances -medicines for migraine headache like almotriptan, eletriptan, frovatriptan, naratriptan, rizatriptan, sumatriptan, zolmitriptan -medicines for pain like  pentazocine, buprenorphine, butorphanol, meperidine, nalbuphine, and propoxyphene -medicines for sleep -muscle relaxants -naltrexone -phenobarbital -phenothiazines like perphenazine, thioridazine, chlorpromazine, mesoridazine, fluphenazine, prochlorperazine, promazine, and trifluoperazine -procarbazine -warfarin This list may not describe all possible interactions. Give your health care provider a list of all the medicines, herbs, non-prescription drugs, or dietary supplements you use. Also tell them if you smoke, drink alcohol, or use illegal drugs. Some items may interact with your medicine. What should I watch for while using this medicine? Tell your doctor or health care professional if your pain does not go away, if it gets worse, or if you have new or a different type of pain. You may develop tolerance to the medicine. Tolerance means that you will need a higher dose of the medicine for pain relief. Tolerance is normal and is expected if you take this medicine for a long time. Do not suddenly stop taking your medicine because you may develop a severe reaction. Your body becomes used to the medicine. This does NOT mean you are addicted. Addiction is a behavior related to getting and using a drug for a non-medical reason. If you have pain, you have a medical reason to take pain medicine. Your doctor will tell you how much medicine to take. If your doctor wants you to stop the medicine, the dose will be slowly lowered over time to avoid any side effects. You may get drowsy or dizzy. Do not drive, use machinery, or do anything that needs mental alertness until you know how this medicine affects you. Do not stand or sit up quickly, especially if you are an older patient. This reduces the risk of dizzy or fainting spells. Alcohol can increase or decrease the effects of this medicine. Avoid alcoholic drinks. You may have constipation. Try to have a bowel movement at least every 2 to 3 days. If you do not  have a bowel movement for 3 days, call your doctor or health care professional. Your mouth may get dry. Chewing sugarless gum or sucking hard candy, and drinking plenty of water may help. Contact your doctor if the problem does not go away or is severe. What side effects may I notice from receiving this medicine? Side effects that you should report to your doctor or health care professional as soon as possible: -allergic reactions like skin rash, itching or hives, swelling of the face, lips, or tongue -breathing difficulties, wheezing -confusion -itching -light headedness or fainting spells -redness, blistering, peeling or loosening of the skin, including inside the mouth -seizures Side effects that usually do not require medical attention (report to your doctor or health care professional if they continue or are bothersome): -constipation -dizziness -drowsiness -headache -nausea, vomiting This list may not describe all possible side effects. Call your doctor for medical advice about side effects. You may report side effects to FDA at 1-800-FDA-1088. Where should I keep my medicine? Keep out of the reach of children. This medicine may cause accidental overdose and death if it taken by other adults, children, or pets. Mix any unused medicine with a substance like cat litter or coffee grounds. Then throw the medicine away in a sealed container like a sealed bag or  a coffee can with a lid. Do not use the medicine after the expiration date. Store at room temperature between 15 and 30 degrees C (59 and 86 degrees F). NOTE: This sheet is a summary. It may not cover all possible information. If you have questions about this medicine, talk to your doctor, pharmacist, or health care provider.    2016, [employer]. (2013-12-01 15:42:09)

## 2016-06-17 NOTE — Progress Notes (Signed)
Discharge instructions discussed until no further questions ask. Patient able to answer questions about nwhen to return to work and when to call md.

## 2016-06-17 NOTE — Discharge Summary (Signed)
Physician Discharge Summary  Patient ID: [name] MRN: [id number] DOB/AGE: 04-11-1956 60 y.o.  Admit date: 06/16/2016 Discharge date: 06/17/2016  Admission Diagnoses: Endometrial cancer [facility])  Discharge Diagnoses:  Principal Problem:   Endometrial cancer [facility])   Discharged Condition:  The patient is in good condition and stable for discharge.    Hospital Course: On 06/16/2016, the patient underwent the following: Procedure(s): XI ROBOTIC ASSISTED TOTAL HYSTERECTOMY WITH BILATERAL SALPINGO OOPHORECTOMY AND SENTINAL LYMPH NODE BIOPSY, MINI LAPAROTOMY.  The postoperative course was uneventful.  She was discharged to home on postoperative day 1 tolerating a regular diet, voiding, ambulating, minimal pain, passing flatus.  Consults: None  Significant Diagnostic Studies: None  Treatments: surgery: see above  Discharge Exam: Blood pressure 123/76, pulse 69, temperature 98.6 F (37 C), temperature source Oral, resp. rate 20, height 4\' 11"  (1.499 m), weight 209 lb (94.8 kg), last menstrual period 05/18/2016, SpO2 100 %. General appearance: alert, cooperative and no distress Resp: clear to auscultation bilaterally Cardio: regular rate and rhythm, S1, S2 normal, no murmur, click, rub or gallop GI: soft, non-tender; bowel sounds normal; no masses,  no organomegaly Extremities: extremities normal, atraumatic, no cyanosis or edema Incision/Wound: Lap sites to the abdomen with dermabond along with suprapubic mini-lap incision without erythema or drainage  Disposition: 01-Home or Self Care  Discharge Instructions    Call MD for:  difficulty breathing, headache or visual disturbances    Complete by:  As directed   Call MD for:  extreme fatigue    Complete by:  As directed   Call MD for:  hives    Complete by:  As directed   Call MD for:  persistant dizziness or light-headedness    Complete by:  As directed   Call MD for:  persistant nausea and vomiting    Complete by:  As directed    Call MD for:  redness, tenderness, or signs of infection (pain, swelling, redness, odor or green/yellow discharge around incision site)    Complete by:  As directed   Call MD for:  severe uncontrolled pain    Complete by:  As directed   Call MD for:  temperature >100.4    Complete by:  As directed   Diet - low sodium heart healthy    Complete by:  As directed   Driving Restrictions    Complete by:  As directed   No driving for 1 week.  Do not take narcotics and drive.   Increase activity slowly    Complete by:  As directed   Lifting restrictions    Complete by:  As directed   No lifting greater than 10 lbs.   Sexual Activity Restrictions    Complete by:  As directed   No sexual activity, nothing in the vagina, for 8 weeks.       Medication List    TAKE these medications   ibuprofen 600 MG tablet Commonly known as:  ADVIL,MOTRIN Ibuprofen 600 mg tablets 1 tablet by mouth every 6 hours for 3 days around-the-clock and then 1 tablet every 6 hours as needed for pain   metFORMIN 500 MG 24 hr tablet Commonly known as:  GLUCOPHAGE-XR TAKE 1 TABLET BY MOUTH EVERY DAY WITH BREAKFAST What changed:  See the new instructions.   naproxen sodium 220 MG tablet Commonly known as:  ANAPROX Take 440 mg by mouth daily as needed (pain).   tacrolimus 0.1 % ointment Commonly known as:  PROTOPIC Apply 1 application topically daily as needed (  eczema).   telmisartan-hydrochlorothiazide 80-12.5 MG tablet Commonly known as:  MICARDIS HCT TAKE 1 TABLET EVERY DAY   traMADol 50 MG tablet Commonly known as:  ULTRAM Take 1 tablet (50 mg total) by mouth every 6 (six) hours as needed for moderate pain.      Follow-up Information    [name], MD Follow up on 07/08/2016.   Specialty:  Obstetrics and Gynecology Why:  at 3:15pm at the [facility] for post-op follow up Contact information: [address] [place] [address] [phone]           Greater than thirty minutes were  spend for face to face discharge instructions and discharge orders/summary in EPIC.   Signed: Lamarius Dirr DEAL 06/17/2016, 10:39 AM

## 2016-06-19 ENCOUNTER — Telehealth: Payer: Self-pay | Admitting: Gynecologic Oncology

## 2016-06-19 NOTE — Telephone Encounter (Signed)
Returned call to patient.  She had contacted the office earlier asking about her final path and stating she had done a little too much and was achy now.  All questions answered.  Informed patient of final path results and Dr. Serita Grit recommendations for no further treatment.  Patient reporting minimal amount of blood noted from the incisions which she has been using bandaids for.  Advised to call for any needs or concerns.  Advised to keep follow up appt.

## 2016-06-22 ENCOUNTER — Emergency Department (HOSPITAL_COMMUNITY): Payer: 59

## 2016-06-22 ENCOUNTER — Emergency Department (HOSPITAL_COMMUNITY)
Admission: EM | Admit: 2016-06-22 | Discharge: 2016-06-23 | Disposition: A | Discharge status: Home / Self Care | Payer: 59 | Attending: Emergency Medicine | Admitting: Emergency Medicine

## 2016-06-22 ENCOUNTER — Encounter (HOSPITAL_COMMUNITY): Payer: Self-pay | Admitting: Emergency Medicine

## 2016-06-22 DIAGNOSIS — R1031 Right lower quadrant pain: Secondary | ICD-10-CM | POA: Diagnosis not present

## 2016-06-22 DIAGNOSIS — Z79899 Other long term (current) drug therapy: Secondary | ICD-10-CM | POA: Diagnosis not present

## 2016-06-22 DIAGNOSIS — E119 Type 2 diabetes mellitus without complications: Secondary | ICD-10-CM | POA: Insufficient documentation

## 2016-06-22 DIAGNOSIS — R52 Pain, unspecified: Secondary | ICD-10-CM

## 2016-06-22 DIAGNOSIS — Y733 Surgical instruments, materials and gastroenterology and urology devices (including sutures) associated with adverse incidents: Secondary | ICD-10-CM | POA: Diagnosis not present

## 2016-06-22 DIAGNOSIS — T819XXA Unspecified complication of procedure, initial encounter: Secondary | ICD-10-CM | POA: Diagnosis present

## 2016-06-22 DIAGNOSIS — Z7984 Long term (current) use of oral hypoglycemic drugs: Secondary | ICD-10-CM | POA: Diagnosis not present

## 2016-06-22 DIAGNOSIS — Z5189 Encounter for other specified aftercare: Secondary | ICD-10-CM

## 2016-06-22 NOTE — ED Triage Notes (Signed)
Pt had a hysterectomy a week ago tomorrow.  Pt has had extreme pain from the surgery which had resolved some today but the pain had become more localized.  Pt reports she thinks a stitch/glue has come loose because she had drainage from the site earlier today (enough to where it was dripping down her leg).  Pt also reports she feels like it is swollen more than it was after her procedure.
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
## 2016-06-23 ENCOUNTER — Ambulatory Visit (HOSPITAL_BASED_OUTPATIENT_CLINIC_OR_DEPARTMENT_OTHER): Payer: 59 | Admitting: Gynecologic Oncology

## 2016-06-23 ENCOUNTER — Encounter: Payer: Self-pay | Admitting: Gynecologic Oncology

## 2016-06-23 VITALS — BP 139/77 | HR 69 | Temp 98.8°F | Resp 18 | Wt 208.0 lb

## 2016-06-23 DIAGNOSIS — Z803 Family history of malignant neoplasm of breast: Secondary | ICD-10-CM

## 2016-06-23 DIAGNOSIS — Z853 Personal history of malignant neoplasm of breast: Secondary | ICD-10-CM | POA: Insufficient documentation

## 2016-06-23 DIAGNOSIS — T792XXA Traumatic secondary and recurrent hemorrhage and seroma, initial encounter: Secondary | ICD-10-CM

## 2016-06-23 DIAGNOSIS — Z90710 Acquired absence of both cervix and uterus: Secondary | ICD-10-CM | POA: Insufficient documentation

## 2016-06-23 DIAGNOSIS — Z923 Personal history of irradiation: Secondary | ICD-10-CM

## 2016-06-23 DIAGNOSIS — E785 Hyperlipidemia, unspecified: Secondary | ICD-10-CM | POA: Insufficient documentation

## 2016-06-23 DIAGNOSIS — D649 Anemia, unspecified: Secondary | ICD-10-CM

## 2016-06-23 DIAGNOSIS — I10 Essential (primary) hypertension: Secondary | ICD-10-CM

## 2016-06-23 DIAGNOSIS — Z888 Allergy status to other drugs, medicaments and biological substances status: Secondary | ICD-10-CM

## 2016-06-23 DIAGNOSIS — C541 Malignant neoplasm of endometrium: Secondary | ICD-10-CM | POA: Insufficient documentation

## 2016-06-23 DIAGNOSIS — Z800 Family history of malignant neoplasm of digestive organs: Secondary | ICD-10-CM | POA: Insufficient documentation

## 2016-06-23 DIAGNOSIS — E119 Type 2 diabetes mellitus without complications: Secondary | ICD-10-CM | POA: Insufficient documentation

## 2016-06-23 DIAGNOSIS — Z6841 Body Mass Index (BMI) 40.0 and over, adult: Secondary | ICD-10-CM

## 2016-06-23 DIAGNOSIS — G4733 Obstructive sleep apnea (adult) (pediatric): Secondary | ICD-10-CM

## 2016-06-23 DIAGNOSIS — Z8249 Family history of ischemic heart disease and other diseases of the circulatory system: Secondary | ICD-10-CM | POA: Insufficient documentation

## 2016-06-23 DIAGNOSIS — Z885 Allergy status to narcotic agent status: Secondary | ICD-10-CM | POA: Insufficient documentation

## 2016-06-23 DIAGNOSIS — Z8619 Personal history of other infectious and parasitic diseases: Secondary | ICD-10-CM

## 2016-06-23 DIAGNOSIS — Z96652 Presence of left artificial knee joint: Secondary | ICD-10-CM | POA: Insufficient documentation

## 2016-06-23 DIAGNOSIS — Z823 Family history of stroke: Secondary | ICD-10-CM

## 2016-06-23 DIAGNOSIS — Z833 Family history of diabetes mellitus: Secondary | ICD-10-CM | POA: Insufficient documentation

## 2016-06-23 DIAGNOSIS — N318 Other neuromuscular dysfunction of bladder: Secondary | ICD-10-CM | POA: Insufficient documentation

## 2016-06-23 DIAGNOSIS — Z7984 Long term (current) use of oral hypoglycemic drugs: Secondary | ICD-10-CM | POA: Insufficient documentation

## 2016-06-23 DIAGNOSIS — Z86010 Personal history of colonic polyps: Secondary | ICD-10-CM

## 2016-06-23 DIAGNOSIS — Z90722 Acquired absence of ovaries, bilateral: Secondary | ICD-10-CM

## 2016-06-23 DIAGNOSIS — L309 Dermatitis, unspecified: Secondary | ICD-10-CM

## 2016-06-23 DIAGNOSIS — T888XXA Other specified complications of surgical and medical care, not elsewhere classified, initial encounter: Secondary | ICD-10-CM

## 2016-06-23 LAB — URINALYSIS, ROUTINE W REFLEX MICROSCOPIC
Bilirubin Urine: NEGATIVE
Glucose, UA: NEGATIVE mg/dL
Ketones, ur: NEGATIVE mg/dL
Leukocytes, UA: NEGATIVE
Nitrite: NEGATIVE
Protein, ur: NEGATIVE mg/dL
Specific Gravity, Urine: 1.015 (ref 1.005–1.030)
pH: 7 (ref 5.0–8.0)

## 2016-06-23 LAB — URINE MICROSCOPIC-ADD ON
Bacteria, UA: NONE SEEN
WBC, UA: NONE SEEN WBC/hpf (ref 0–5)

## 2016-06-23 MED ORDER — CEPHALEXIN 500 MG PO CAPS
500.0000 mg | ORAL_CAPSULE | Freq: Once | ORAL | Status: AC
  Administered 2016-06-23: 500 mg via ORAL
  Filled 2016-06-23: qty 1

## 2016-06-23 MED ORDER — CEPHALEXIN 500 MG PO CAPS: 500.0000 mg | ORAL_CAPSULE | Freq: Four times a day (QID) | ORAL | 0 refills | Status: DC

## 2016-06-23 NOTE — Patient Instructions (Signed)
Please call for any fever, chills, redness around your incision, increased drainage.  Please contact the Office of Patient Experience to discuss your experience: [phone].  Please call for any questions or concerns.  Begin taking the Keflex.

## 2016-06-24 ENCOUNTER — Telehealth: Payer: Self-pay | Admitting: Gynecologic Oncology

## 2016-06-24 NOTE — Telephone Encounter (Signed)
Called to check on patient's status.  Stating she has not seen any further drainage from her low transverse incision.  She is still upset about her recent ED visit and feels like nothing was explained to her.  She is requesting her records.  Patient advised that request would need to go through Medical Records.  Advised patient to contact the Office of Patient Experience with her concerns.  Number given to her yesterday in the office.  Advised to call for any drainage, concerns, or questions.  All questions answered at this time.

## 2016-06-25 ENCOUNTER — Telehealth: Payer: Self-pay | Admitting: Gynecologic Oncology

## 2016-06-25 DIAGNOSIS — IMO0002 Reserved for concepts with insufficient information to code with codable children: Secondary | ICD-10-CM | POA: Insufficient documentation
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
## 2016-06-25 NOTE — Telephone Encounter (Signed)
Returned call to patient.  Patient stating she had another episode of a large amount of yellowish fluid coming from her low transverse incision last pm with minimal amount of pink drainage.  She states she notices when the fluid begins to build up because she has increased pain, stomach hurts, and her stretch marks change in look.  She states she feels she cramps up when she urinates.  All questions answered.  Multiple concerns voiced and addressed.  Advised that Dr. Denman George would be made aware of her concerns and we would contact her with any recommendations.  She is advised to call for any concerns, questions, changes, or increased drainage from her incision.

## 2016-06-26 ENCOUNTER — Telehealth: Payer: Self-pay | Admitting: Gynecologic Oncology

## 2016-06-26 NOTE — Telephone Encounter (Signed)
Called to enquire about patient's condition. No response so message left on answering machine including detailed message with information about how to reach the after hours contact number and speak to me or my partner over the weekend if she is having issues with drainage or pain.  [name], MD

## 2016-07-01 NOTE — Progress Notes (Signed)
Surgery on 07/22/2016.  preop on 07/15/16 at 1000am.  Needs orders in EPIC.  Thank You!

## 2016-07-02 ENCOUNTER — Ambulatory Visit: Payer: Self-pay | Admitting: Orthopedic Surgery

## 2016-07-06 ENCOUNTER — Telehealth: Payer: Self-pay

## 2016-07-06 NOTE — Telephone Encounter (Signed)
Call returned , patient states she was seen by Joylene John, APNP on September 05 , 2017,for right lower  Incisional drainage , a follow up appointment was scheduled for September 20 th with Dr Everitt Amber Patient states that she had not had any drainage for three days and Saturday afternoon she she noticed a lump the size of a "walnut" to the same site . Patient states she "oressed" on it and drainage came out , it appears to be the same color "pinkish" and sticky . Patient states she is having some pain to the site ,denies chills or fever. Melissa Cross, APNP updated : recommendations are to apply warm compresses to the site and continue to monitor the amount of drainage. Patient states understanding and will call tomorrow if it has not improved. Patient is aware of her follow up with Dr Everitt Amber this week Wednesday ,denies further questions at this time.

## 2016-07-08 ENCOUNTER — Encounter: Payer: Self-pay | Admitting: Gynecologic Oncology

## 2016-07-08 ENCOUNTER — Ambulatory Visit: Payer: 59 | Attending: Gynecologic Oncology | Admitting: Gynecologic Oncology

## 2016-07-08 VITALS — BP 127/70 | HR 70 | Temp 98.1°F | Resp 18 | Wt 209.1 lb

## 2016-07-08 DIAGNOSIS — Z803 Family history of malignant neoplasm of breast: Secondary | ICD-10-CM | POA: Diagnosis not present

## 2016-07-08 DIAGNOSIS — C541 Malignant neoplasm of endometrium: Secondary | ICD-10-CM | POA: Insufficient documentation

## 2016-07-08 DIAGNOSIS — Z885 Allergy status to narcotic agent status: Secondary | ICD-10-CM | POA: Diagnosis not present

## 2016-07-08 DIAGNOSIS — E785 Hyperlipidemia, unspecified: Secondary | ICD-10-CM | POA: Diagnosis not present

## 2016-07-08 DIAGNOSIS — Z800 Family history of malignant neoplasm of digestive organs: Secondary | ICD-10-CM | POA: Insufficient documentation

## 2016-07-08 DIAGNOSIS — E119 Type 2 diabetes mellitus without complications: Secondary | ICD-10-CM | POA: Diagnosis not present

## 2016-07-08 DIAGNOSIS — Z888 Allergy status to other drugs, medicaments and biological substances status: Secondary | ICD-10-CM | POA: Insufficient documentation

## 2016-07-08 DIAGNOSIS — D649 Anemia, unspecified: Secondary | ICD-10-CM | POA: Diagnosis not present

## 2016-07-08 DIAGNOSIS — Z811 Family history of alcohol abuse and dependence: Secondary | ICD-10-CM | POA: Insufficient documentation

## 2016-07-08 DIAGNOSIS — Z86010 Personal history of colonic polyps: Secondary | ICD-10-CM | POA: Insufficient documentation

## 2016-07-08 DIAGNOSIS — M1990 Unspecified osteoarthritis, unspecified site: Secondary | ICD-10-CM | POA: Insufficient documentation

## 2016-07-08 DIAGNOSIS — Z90710 Acquired absence of both cervix and uterus: Secondary | ICD-10-CM | POA: Diagnosis not present

## 2016-07-08 DIAGNOSIS — N938 Other specified abnormal uterine and vaginal bleeding: Secondary | ICD-10-CM | POA: Diagnosis not present

## 2016-07-08 DIAGNOSIS — N99842 Postprocedural seroma of a genitourinary system organ or structure following a genitourinary system procedure: Secondary | ICD-10-CM

## 2016-07-08 DIAGNOSIS — I10 Essential (primary) hypertension: Secondary | ICD-10-CM | POA: Insufficient documentation

## 2016-07-08 DIAGNOSIS — Z823 Family history of stroke: Secondary | ICD-10-CM | POA: Diagnosis not present

## 2016-07-08 DIAGNOSIS — Z90722 Acquired absence of ovaries, bilateral: Secondary | ICD-10-CM | POA: Insufficient documentation

## 2016-07-08 DIAGNOSIS — Z5189 Encounter for other specified aftercare: Secondary | ICD-10-CM | POA: Diagnosis not present

## 2016-07-08 DIAGNOSIS — Z9889 Other specified postprocedural states: Secondary | ICD-10-CM | POA: Diagnosis not present

## 2016-07-08 DIAGNOSIS — Z853 Personal history of malignant neoplasm of breast: Secondary | ICD-10-CM | POA: Insufficient documentation

## 2016-07-08 DIAGNOSIS — R229 Localized swelling, mass and lump, unspecified: Secondary | ICD-10-CM | POA: Insufficient documentation

## 2016-07-08 DIAGNOSIS — Z96652 Presence of left artificial knee joint: Secondary | ICD-10-CM | POA: Insufficient documentation

## 2016-07-08 DIAGNOSIS — Z8249 Family history of ischemic heart disease and other diseases of the circulatory system: Secondary | ICD-10-CM | POA: Insufficient documentation

## 2016-07-08 DIAGNOSIS — Z8619 Personal history of other infectious and parasitic diseases: Secondary | ICD-10-CM | POA: Insufficient documentation

## 2016-07-08 DIAGNOSIS — Z833 Family history of diabetes mellitus: Secondary | ICD-10-CM | POA: Insufficient documentation

## 2016-07-08 DIAGNOSIS — G4733 Obstructive sleep apnea (adult) (pediatric): Secondary | ICD-10-CM | POA: Insufficient documentation

## 2016-07-08 NOTE — Patient Instructions (Signed)
You will receive a phone call from the hospital about a date and time to have your ultrasound and drainage of the area.
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
## 2016-07-15 ENCOUNTER — Encounter (HOSPITAL_COMMUNITY)
Admission: RE | Admit: 2016-07-15 | Discharge: 2016-07-15 | Disposition: A | Discharge status: Home / Self Care | Payer: 59 | Source: Ambulatory Visit | Attending: Orthopedic Surgery | Admitting: Orthopedic Surgery

## 2016-07-15 ENCOUNTER — Encounter (HOSPITAL_COMMUNITY): Payer: Self-pay

## 2016-07-15 ENCOUNTER — Other Ambulatory Visit: Payer: Self-pay | Admitting: Radiology

## 2016-07-15 DIAGNOSIS — Z885 Allergy status to narcotic agent status: Secondary | ICD-10-CM | POA: Diagnosis not present

## 2016-07-15 DIAGNOSIS — Z8619 Personal history of other infectious and parasitic diseases: Secondary | ICD-10-CM | POA: Diagnosis not present

## 2016-07-15 DIAGNOSIS — E119 Type 2 diabetes mellitus without complications: Secondary | ICD-10-CM | POA: Diagnosis not present

## 2016-07-15 DIAGNOSIS — E785 Hyperlipidemia, unspecified: Secondary | ICD-10-CM | POA: Diagnosis not present

## 2016-07-15 DIAGNOSIS — Z7984 Long term (current) use of oral hypoglycemic drugs: Secondary | ICD-10-CM | POA: Diagnosis not present

## 2016-07-15 DIAGNOSIS — Z800 Family history of malignant neoplasm of digestive organs: Secondary | ICD-10-CM | POA: Diagnosis not present

## 2016-07-15 DIAGNOSIS — Z833 Family history of diabetes mellitus: Secondary | ICD-10-CM | POA: Diagnosis not present

## 2016-07-15 DIAGNOSIS — Z90722 Acquired absence of ovaries, bilateral: Secondary | ICD-10-CM | POA: Diagnosis not present

## 2016-07-15 DIAGNOSIS — Z90710 Acquired absence of both cervix and uterus: Secondary | ICD-10-CM | POA: Diagnosis not present

## 2016-07-15 DIAGNOSIS — Z8542 Personal history of malignant neoplasm of other parts of uterus: Secondary | ICD-10-CM | POA: Diagnosis not present

## 2016-07-15 DIAGNOSIS — R188 Other ascites: Secondary | ICD-10-CM | POA: Diagnosis not present

## 2016-07-15 DIAGNOSIS — Z8249 Family history of ischemic heart disease and other diseases of the circulatory system: Secondary | ICD-10-CM | POA: Diagnosis not present

## 2016-07-15 DIAGNOSIS — Z96652 Presence of left artificial knee joint: Secondary | ICD-10-CM | POA: Diagnosis not present

## 2016-07-15 DIAGNOSIS — R229 Localized swelling, mass and lump, unspecified: Secondary | ICD-10-CM | POA: Diagnosis present

## 2016-07-15 DIAGNOSIS — G4733 Obstructive sleep apnea (adult) (pediatric): Secondary | ICD-10-CM | POA: Diagnosis not present

## 2016-07-15 DIAGNOSIS — I10 Essential (primary) hypertension: Secondary | ICD-10-CM | POA: Diagnosis not present

## 2016-07-15 DIAGNOSIS — Z823 Family history of stroke: Secondary | ICD-10-CM | POA: Diagnosis not present

## 2016-07-15 DIAGNOSIS — D649 Anemia, unspecified: Secondary | ICD-10-CM | POA: Diagnosis not present

## 2016-07-15 DIAGNOSIS — Z853 Personal history of malignant neoplasm of breast: Secondary | ICD-10-CM | POA: Diagnosis not present

## 2016-07-15 DIAGNOSIS — Z86010 Personal history of colonic polyps: Secondary | ICD-10-CM | POA: Diagnosis not present

## 2016-07-15 DIAGNOSIS — Z803 Family history of malignant neoplasm of breast: Secondary | ICD-10-CM | POA: Diagnosis not present

## 2016-07-15 DIAGNOSIS — Z888 Allergy status to other drugs, medicaments and biological substances status: Secondary | ICD-10-CM | POA: Diagnosis not present

## 2016-07-15 LAB — COMPREHENSIVE METABOLIC PANEL
ALT: 24 U/L (ref 14–54)
AST: 24 U/L (ref 15–41)
Albumin: 3.6 g/dL (ref 3.5–5.0)
Alkaline Phosphatase: 41 U/L (ref 38–126)
Anion gap: 6 (ref 5–15)
BUN: 17 mg/dL (ref 6–20)
CO2: 26 mmol/L (ref 22–32)
Calcium: 9.6 mg/dL (ref 8.9–10.3)
Chloride: 108 mmol/L (ref 101–111)
Creatinine, Ser: 0.67 mg/dL (ref 0.44–1.00)
GFR calc Af Amer: >60 mL/min (ref >60)
GFR calc non Af Amer: >60 mL/min (ref >60)
Glucose, Bld: 107 mg/dL — ABNORMAL HIGH (ref 65–99)
Potassium: 4.2 mmol/L (ref 3.5–5.1)
Sodium: 140 mmol/L (ref 135–145)
Total Bilirubin: 0.6 mg/dL (ref 0.3–1.2)
Total Protein: 8.4 g/dL — ABNORMAL HIGH (ref 6.5–8.1)

## 2016-07-15 LAB — CBC
HCT: 34 % — ABNORMAL LOW (ref 36.0–46.0)
Hemoglobin: 10.6 g/dL — ABNORMAL LOW (ref 12.0–15.0)
MCH: 27 pg (ref 26.0–34.0)
MCHC: 31.2 g/dL (ref 30.0–36.0)
MCV: 86.7 fL (ref 78.0–100.0)
Platelets: 299 10*3/uL (ref 150–400)
RBC: 3.92 MIL/uL (ref 3.87–5.11)
RDW: 14.7 % (ref 11.5–15.5)
WBC: 4.7 10*3/uL (ref 4.0–10.5)

## 2016-07-15 LAB — URINALYSIS, ROUTINE W REFLEX MICROSCOPIC
Bilirubin Urine: NEGATIVE
Glucose, UA: NEGATIVE mg/dL
Ketones, ur: NEGATIVE mg/dL
Leukocytes, UA: NEGATIVE
Nitrite: NEGATIVE
Protein, ur: NEGATIVE mg/dL
Specific Gravity, Urine: 1.023 (ref 1.005–1.030)
pH: 5 (ref 5.0–8.0)

## 2016-07-15 LAB — PROTIME-INR
INR: 1.02
Prothrombin Time: 13.4 seconds (ref 11.4–15.2)

## 2016-07-15 LAB — SURGICAL PCR SCREEN
MRSA, PCR: NEGATIVE
Staphylococcus aureus: NEGATIVE

## 2016-07-15 LAB — URINE MICROSCOPIC-ADD ON

## 2016-07-15 LAB — APTT: aPTT: 30 seconds (ref 24–36)

## 2016-07-15 NOTE — Patient Instructions (Signed)
[name]  07/15/2016   Your procedure is scheduled on: Wednesday 07/22/2016  Report to [facility]  Entrance take [place]  elevators to 3rd floor to  [place] at  1245 PM.  Call this number if you have problems the morning of surgery [phone]   Remember: ONLY 1 PERSON MAY GO WITH YOU TO SHORT STAY TO GET  READY MORNING OF [name].    Do not eat food  :After Midnight.  MAY HAVE CLEAR LIQUIDS FROM MIDNIGHT UP UNTIL 0945 AM THE MORNING OF SURGERY AND THEN NOTHING UNTIL AFTER SURGERY!     CLEAR LIQUID DIET   Foods Allowed                                                                     Foods Excluded  Coffee and tea, regular and decaf                             liquids that you cannot  Plain Jell-O in any flavor                                             see through such as: Fruit ices (not with fruit pulp)                                     milk, soups, orange juice  Iced Popsicles                                    All solid food Carbonated beverages, regular and diet                                    Cranberry, grape and apple juices Sports drinks like Gatorade Lightly seasoned clear broth or consume(fat free) Sugar, honey syrup  Sample Menu Breakfast                                Lunch                                     Supper Cranberry juice                    Beef broth                            Chicken broth Jell-O  Grape juice                           Apple juice Coffee or tea                        Jell-O                                      Popsicle                                                Coffee or tea                        Coffee or tea  _____________________________________________________________________     Take these medicines the morning of surgery with A SIP OF WATER: none               DO NOT TAKE ANY DIABETIC MEDICATIONS DAY OF YOUR SURGERY!                        You may not have any metal on your body including hair pins and              piercings  Do not wear jewelry, make-up, lotions, powders or perfumes, deodorant             Do not wear nail polish.  Do not shave  48 hours prior to surgery.              Men may shave face and neck.   Do not bring valuables to the hospital. [place].  Contacts, dentures or bridgework may not be worn into surgery.  Leave suitcase in the car. After surgery it may be brought to your room.                  Please read over the following fact sheets you were given: _____________________________________________________________________             [facility] - Preparing for Surgery Before surgery, you can play an important role.  Because skin is not sterile, your skin needs to be as free of germs as possible.  You can reduce the number of germs on your skin by washing with CHG (chlorahexidine gluconate) soap before surgery.  CHG is an antiseptic cleaner which kills germs and bonds with the skin to continue killing germs even after washing. Please DO NOT use if you have an allergy to CHG or antibacterial soaps.  If your skin becomes reddened/irritated stop using the CHG and inform your nurse when you arrive at Short Stay. Do not shave (including legs and underarms) for at least 48 hours prior to the first CHG shower.  You may shave your face/neck. Please follow these instructions carefully:  1.  Shower with CHG Soap the night before surgery and the  morning of Surgery.  2.  If you choose to wash your hair, wash your hair first as usual with your  normal  shampoo.  3.  After you shampoo, rinse your hair  and body thoroughly to remove the  shampoo.                           4.  Use CHG as you would any other liquid soap.  You can apply chg directly  to the skin and wash                       Gently with a scrungie or clean washcloth.  5.  Apply the  CHG Soap to your body ONLY FROM THE NECK DOWN.   Do not use on face/ open                           Wound or open sores. Avoid contact with eyes, ears mouth and genitals (private parts).                       Wash face,  Genitals (private parts) with your normal soap.             6.  Wash thoroughly, paying special attention to the area where your surgery  will be performed.  7.  Thoroughly rinse your body with warm water from the neck down.  8.  DO NOT shower/wash with your normal soap after using and rinsing off  the CHG Soap.                9.  Pat yourself dry with a clean towel.            10.  Wear clean pajamas.            11.  Place clean sheets on your bed the night of your first shower and do not  sleep with pets. Day of Surgery : Do not apply any lotions/deodorants the morning of surgery.  Please wear clean clothes to the hospital/surgery center.  FAILURE TO FOLLOW THESE INSTRUCTIONS MAY RESULT IN THE CANCELLATION OF YOUR SURGERY PATIENT SIGNATURE_________________________________  NURSE SIGNATURE__________________________________  ________________________________________________________________________   [name]  An incentive spirometer is a tool that can help keep your lungs clear and active. This tool measures how well you are filling your lungs with each breath. Taking long deep breaths may help reverse or decrease the chance of developing breathing (pulmonary) problems (especially infection) following:  A long period of time when you are unable to move or be active. BEFORE THE PROCEDURE   If the spirometer includes an indicator to show your best effort, your nurse or respiratory therapist will set it to a desired goal.  If possible, sit up straight or lean slightly forward. Try not to slouch.  Hold the incentive spirometer in an upright position. INSTRUCTIONS FOR USE  1. Sit on the edge of your bed if possible, or sit up as far as you can in bed or on a  chair. 2. Hold the incentive spirometer in an upright position. 3. Breathe out normally. 4. Place the mouthpiece in your mouth and seal your lips tightly around it. 5. Breathe in slowly and as deeply as possible, raising the piston or the ball toward the top of the column. 6. Hold your breath for 3-5 seconds or for as long as possible. Allow the piston or ball to fall to the bottom of the column. 7. Remove the mouthpiece from your mouth and breathe out normally. 8. Rest for a few seconds and repeat Steps  1 through 7 at least 10 times every 1-2 hours when you are awake. Take your time and take a few normal breaths between deep breaths. 9. The spirometer may include an indicator to show your best effort. Use the indicator as a goal to work toward during each repetition. 10. After each set of 10 deep breaths, practice coughing to be sure your lungs are clear. If you have an incision (the cut made at the time of surgery), support your incision when coughing by placing a pillow or rolled up towels firmly against it. Once you are able to get out of bed, walk around indoors and cough well. You may stop using the incentive spirometer when instructed by your caregiver.  RISKS AND COMPLICATIONS  Take your time so you do not get dizzy or light-headed.  If you are in pain, you may need to take or ask for pain medication before doing incentive spirometry. It is harder to take a deep breath if you are having pain. AFTER USE  Rest and breathe slowly and easily.  It can be helpful to keep track of a log of your progress. Your caregiver can provide you with a simple table to help with this. If you are using the spirometer at home, follow these instructions: [place] IF:   You are having difficultly using the spirometer.  You have trouble using the spirometer as often as instructed.  Your pain medication is not giving enough relief while using the spirometer.  You develop fever of 100.5 F  (38.1 C) or higher. SEEK IMMEDIATE MEDICAL CARE IF:   You cough up bloody sputum that had not been present before.  You develop fever of 102 F (38.9 C) or greater.  You develop worsening pain at or near the incision site. MAKE SURE YOU:   Understand these instructions.  Will watch your condition.  Will get help right away if you are not doing well or get worse. Document Released: 02/15/2007 Document Revised: 12/28/2011 Document Reviewed: 04/18/2007 [employer] Patient Information 2014 [employer], [place].   ________________________________________________________________________  WHAT IS A BLOOD TRANSFUSION? Blood Transfusion Information  A transfusion is the replacement of blood or some of its parts. Blood is made up of multiple cells which provide different functions.  Red blood cells carry oxygen and are used for blood loss replacement.  White blood cells fight against infection.  Platelets control bleeding.  Plasma helps clot blood.  Other blood products are available for specialized needs, such as hemophilia or other clotting disorders. BEFORE THE TRANSFUSION  Who gives blood for transfusions?   Healthy volunteers who are fully evaluated to make sure their blood is safe. This is blood bank blood. Transfusion therapy is the safest it has ever been in the practice of medicine. Before blood is taken from a donor, a complete history is taken to make sure that person has no history of diseases nor engages in risky social behavior (examples are intravenous drug use or sexual activity with multiple partners). The donor's travel history is screened to minimize risk of transmitting infections, such as malaria. The donated blood is tested for signs of infectious diseases, such as HIV and hepatitis. The blood is then tested to be sure it is compatible with you in order to minimize the chance of a transfusion reaction. If you or a relative donates blood, this is often done in anticipation  of surgery and is not appropriate for emergency situations. It takes many days to process the donated blood. RISKS  AND COMPLICATIONS Although transfusion therapy is very safe and saves many lives, the main dangers of transfusion include:   Getting an infectious disease.  Developing a transfusion reaction. This is an allergic reaction to something in the blood you were given. Every precaution is taken to prevent this. The decision to have a blood transfusion has been considered carefully by your caregiver before blood is given. Blood is not given unless the benefits outweigh the risks. AFTER THE TRANSFUSION  Right after receiving a blood transfusion, you will usually feel much better and more energetic. This is especially true if your red blood cells have gotten low (anemic). The transfusion raises the level of the red blood cells which carry oxygen, and this usually causes an energy increase.  The nurse administering the transfusion will monitor you carefully for complications. HOME CARE INSTRUCTIONS  No special instructions are needed after a transfusion. You may find your energy is better. Speak with your caregiver about any limitations on activity for underlying diseases you may have. SEEK MEDICAL CARE IF:   Your condition is not improving after your transfusion.  You develop redness or irritation at the intravenous (IV) site. SEEK IMMEDIATE MEDICAL CARE IF:  Any of the following symptoms occur over the next 12 hours:  Shaking chills.  You have a temperature by mouth above 102 F (38.9 C), not controlled by medicine.  Chest, back, or muscle pain.  People around you feel you are not acting correctly or are confused.  Shortness of breath or difficulty breathing.  Dizziness and fainting.  You get a rash or develop hives.  You have a decrease in urine output.  Your urine turns a dark color or changes to pink, red, or brown. Any of the following symptoms occur over the next 10  days:  You have a temperature by mouth above 102 F (38.9 C), not controlled by medicine.  Shortness of breath.  Weakness after normal activity.  The white part of the eye turns yellow (jaundice).  You have a decrease in the amount of urine or are urinating less often.  Your urine turns a dark color or changes to pink, red, or brown. Document Released: 10/02/2000 Document Revised: 12/28/2011 Document Reviewed: 05/21/2008 [facility] Patient Information 2014 [place], [place].  _______________________________________________________________________

## 2016-07-15 NOTE — Progress Notes (Signed)
05/25/2016- noted EKG in EPIC. 06/15/2016- noted CXR and HGA1C in EPIC.

## 2016-07-16 ENCOUNTER — Other Ambulatory Visit: Payer: Self-pay | Admitting: Gynecologic Oncology

## 2016-07-16 ENCOUNTER — Ambulatory Visit (HOSPITAL_COMMUNITY)
Admission: RE | Admit: 2016-07-16 | Discharge: 2016-07-16 | Disposition: A | Discharge status: Home / Self Care | Payer: 59 | Source: Ambulatory Visit | Attending: Gynecologic Oncology | Admitting: Gynecologic Oncology

## 2016-07-16 ENCOUNTER — Encounter (HOSPITAL_COMMUNITY): Payer: Self-pay

## 2016-07-16 DIAGNOSIS — R229 Localized swelling, mass and lump, unspecified: Secondary | ICD-10-CM

## 2016-07-16 DIAGNOSIS — Z90722 Acquired absence of ovaries, bilateral: Secondary | ICD-10-CM | POA: Insufficient documentation

## 2016-07-16 DIAGNOSIS — Z8542 Personal history of malignant neoplasm of other parts of uterus: Secondary | ICD-10-CM | POA: Insufficient documentation

## 2016-07-16 DIAGNOSIS — E785 Hyperlipidemia, unspecified: Secondary | ICD-10-CM | POA: Insufficient documentation

## 2016-07-16 DIAGNOSIS — Z86010 Personal history of colonic polyps: Secondary | ICD-10-CM | POA: Insufficient documentation

## 2016-07-16 DIAGNOSIS — Z8249 Family history of ischemic heart disease and other diseases of the circulatory system: Secondary | ICD-10-CM | POA: Insufficient documentation

## 2016-07-16 DIAGNOSIS — Z96652 Presence of left artificial knee joint: Secondary | ICD-10-CM | POA: Insufficient documentation

## 2016-07-16 DIAGNOSIS — Z888 Allergy status to other drugs, medicaments and biological substances status: Secondary | ICD-10-CM | POA: Insufficient documentation

## 2016-07-16 DIAGNOSIS — Z853 Personal history of malignant neoplasm of breast: Secondary | ICD-10-CM | POA: Insufficient documentation

## 2016-07-16 DIAGNOSIS — Z8619 Personal history of other infectious and parasitic diseases: Secondary | ICD-10-CM | POA: Insufficient documentation

## 2016-07-16 DIAGNOSIS — Z885 Allergy status to narcotic agent status: Secondary | ICD-10-CM | POA: Insufficient documentation

## 2016-07-16 DIAGNOSIS — Z833 Family history of diabetes mellitus: Secondary | ICD-10-CM | POA: Insufficient documentation

## 2016-07-16 DIAGNOSIS — G4733 Obstructive sleep apnea (adult) (pediatric): Secondary | ICD-10-CM | POA: Insufficient documentation

## 2016-07-16 DIAGNOSIS — Z90710 Acquired absence of both cervix and uterus: Secondary | ICD-10-CM | POA: Insufficient documentation

## 2016-07-16 DIAGNOSIS — Z803 Family history of malignant neoplasm of breast: Secondary | ICD-10-CM | POA: Insufficient documentation

## 2016-07-16 DIAGNOSIS — R188 Other ascites: Secondary | ICD-10-CM | POA: Insufficient documentation

## 2016-07-16 DIAGNOSIS — Z823 Family history of stroke: Secondary | ICD-10-CM | POA: Insufficient documentation

## 2016-07-16 DIAGNOSIS — E119 Type 2 diabetes mellitus without complications: Secondary | ICD-10-CM | POA: Insufficient documentation

## 2016-07-16 DIAGNOSIS — Z800 Family history of malignant neoplasm of digestive organs: Secondary | ICD-10-CM | POA: Insufficient documentation

## 2016-07-16 DIAGNOSIS — Z7984 Long term (current) use of oral hypoglycemic drugs: Secondary | ICD-10-CM | POA: Insufficient documentation

## 2016-07-16 DIAGNOSIS — I10 Essential (primary) hypertension: Secondary | ICD-10-CM | POA: Insufficient documentation

## 2016-07-16 DIAGNOSIS — D649 Anemia, unspecified: Secondary | ICD-10-CM | POA: Insufficient documentation
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
## 2016-07-20 ENCOUNTER — Telehealth: Payer: Self-pay | Admitting: Gynecologic Oncology

## 2016-07-20 NOTE — Telephone Encounter (Signed)
Returned call to patient.  Patient reporting a foul, fishy odor from her vagina.  States the vaginal bleeding (spotting) has stopped.  When she wipes the tissue is clear and not really reporting any discharge.  Info given to Dr. Denman George, who will plan on seeing her in the office tomorrow in between OR cases.  Patient verbalizing understanding.  Advised to call for any needs in between that time.

## 2016-07-21 ENCOUNTER — Encounter: Payer: Self-pay | Admitting: Gynecologic Oncology

## 2016-07-21 ENCOUNTER — Ambulatory Visit (HOSPITAL_BASED_OUTPATIENT_CLINIC_OR_DEPARTMENT_OTHER): Payer: 59 | Admitting: Gynecologic Oncology

## 2016-07-21 ENCOUNTER — Ambulatory Visit: Payer: Self-pay | Admitting: Orthopedic Surgery

## 2016-07-21 VITALS — BP 139/67 | HR 67 | Temp 98.4°F | Resp 18 | Wt 208.1 lb

## 2016-07-21 DIAGNOSIS — N898 Other specified noninflammatory disorders of vagina: Secondary | ICD-10-CM | POA: Insufficient documentation

## 2016-07-21 DIAGNOSIS — Z8619 Personal history of other infectious and parasitic diseases: Secondary | ICD-10-CM | POA: Insufficient documentation

## 2016-07-21 DIAGNOSIS — Z803 Family history of malignant neoplasm of breast: Secondary | ICD-10-CM

## 2016-07-21 DIAGNOSIS — Z7984 Long term (current) use of oral hypoglycemic drugs: Secondary | ICD-10-CM | POA: Insufficient documentation

## 2016-07-21 DIAGNOSIS — G4733 Obstructive sleep apnea (adult) (pediatric): Secondary | ICD-10-CM | POA: Insufficient documentation

## 2016-07-21 DIAGNOSIS — Z811 Family history of alcohol abuse and dependence: Secondary | ICD-10-CM

## 2016-07-21 DIAGNOSIS — I10 Essential (primary) hypertension: Secondary | ICD-10-CM | POA: Insufficient documentation

## 2016-07-21 DIAGNOSIS — Z885 Allergy status to narcotic agent status: Secondary | ICD-10-CM | POA: Insufficient documentation

## 2016-07-21 DIAGNOSIS — E785 Hyperlipidemia, unspecified: Secondary | ICD-10-CM | POA: Insufficient documentation

## 2016-07-21 DIAGNOSIS — E119 Type 2 diabetes mellitus without complications: Secondary | ICD-10-CM | POA: Insufficient documentation

## 2016-07-21 DIAGNOSIS — Z86010 Personal history of colonic polyps: Secondary | ICD-10-CM

## 2016-07-21 DIAGNOSIS — N99842 Postprocedural seroma of a genitourinary system organ or structure following a genitourinary system procedure: Secondary | ICD-10-CM | POA: Diagnosis not present

## 2016-07-21 DIAGNOSIS — Z823 Family history of stroke: Secondary | ICD-10-CM

## 2016-07-21 DIAGNOSIS — Z6841 Body Mass Index (BMI) 40.0 and over, adult: Secondary | ICD-10-CM

## 2016-07-21 DIAGNOSIS — Z170 Estrogen receptor positive status [ER+]: Secondary | ICD-10-CM

## 2016-07-21 DIAGNOSIS — Z96652 Presence of left artificial knee joint: Secondary | ICD-10-CM | POA: Insufficient documentation

## 2016-07-21 DIAGNOSIS — C541 Malignant neoplasm of endometrium: Secondary | ICD-10-CM

## 2016-07-21 DIAGNOSIS — Z853 Personal history of malignant neoplasm of breast: Secondary | ICD-10-CM

## 2016-07-21 DIAGNOSIS — Z888 Allergy status to other drugs, medicaments and biological substances status: Secondary | ICD-10-CM

## 2016-07-21 DIAGNOSIS — Z8249 Family history of ischemic heart disease and other diseases of the circulatory system: Secondary | ICD-10-CM

## 2016-07-21 DIAGNOSIS — Z90722 Acquired absence of ovaries, bilateral: Secondary | ICD-10-CM | POA: Insufficient documentation

## 2016-07-21 DIAGNOSIS — Z800 Family history of malignant neoplasm of digestive organs: Secondary | ICD-10-CM

## 2016-07-21 DIAGNOSIS — D649 Anemia, unspecified: Secondary | ICD-10-CM

## 2016-07-21 DIAGNOSIS — Z90710 Acquired absence of both cervix and uterus: Secondary | ICD-10-CM | POA: Insufficient documentation

## 2016-07-21 DIAGNOSIS — Z886 Allergy status to analgesic agent status: Secondary | ICD-10-CM

## 2016-07-21 DIAGNOSIS — Z833 Family history of diabetes mellitus: Secondary | ICD-10-CM
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
## 2016-07-21 NOTE — Patient Instructions (Signed)
Follow Dr Terrence Dupont Rossi's instructions to consume Active Cultures in yogurt , Kefir or purchase a pill form of a probiotic. Call with additional changes , questions or concerns.  Thank you

## 2016-07-22 ENCOUNTER — Inpatient Hospital Stay (HOSPITAL_COMMUNITY): Payer: 59 | Admitting: Anesthesiology

## 2016-07-22 ENCOUNTER — Encounter (HOSPITAL_COMMUNITY): Payer: Self-pay | Admitting: *Deleted

## 2016-07-22 ENCOUNTER — Inpatient Hospital Stay (HOSPITAL_COMMUNITY)
Admission: RE | Admit: 2016-07-22 | Discharge: 2016-07-24 | DRG: 467 | Disposition: A | Discharge status: Skilled Nursing Facility | Payer: 59 | Source: Ambulatory Visit | Attending: Orthopedic Surgery | Admitting: Orthopedic Surgery

## 2016-07-22 ENCOUNTER — Encounter (HOSPITAL_COMMUNITY)
Admission: RE | Disposition: A | Discharge status: Skilled Nursing Facility | Payer: Self-pay | Source: Ambulatory Visit | Attending: Orthopedic Surgery

## 2016-07-22 DIAGNOSIS — Z6841 Body Mass Index (BMI) 40.0 and over, adult: Secondary | ICD-10-CM

## 2016-07-22 DIAGNOSIS — E785 Hyperlipidemia, unspecified: Secondary | ICD-10-CM | POA: Diagnosis present

## 2016-07-22 DIAGNOSIS — T84018A Broken internal joint prosthesis, other site, initial encounter: Secondary | ICD-10-CM

## 2016-07-22 DIAGNOSIS — M069 Rheumatoid arthritis, unspecified: Secondary | ICD-10-CM | POA: Diagnosis present

## 2016-07-22 DIAGNOSIS — Z853 Personal history of malignant neoplasm of breast: Secondary | ICD-10-CM | POA: Diagnosis not present

## 2016-07-22 DIAGNOSIS — Y792 Prosthetic and other implants, materials and accessory orthopedic devices associated with adverse incidents: Secondary | ICD-10-CM | POA: Diagnosis present

## 2016-07-22 DIAGNOSIS — E119 Type 2 diabetes mellitus without complications: Secondary | ICD-10-CM | POA: Diagnosis present

## 2016-07-22 DIAGNOSIS — Z923 Personal history of irradiation: Secondary | ICD-10-CM | POA: Diagnosis not present

## 2016-07-22 DIAGNOSIS — G4733 Obstructive sleep apnea (adult) (pediatric): Secondary | ICD-10-CM | POA: Diagnosis present

## 2016-07-22 DIAGNOSIS — M1990 Unspecified osteoarthritis, unspecified site: Secondary | ICD-10-CM

## 2016-07-22 DIAGNOSIS — K219 Gastro-esophageal reflux disease without esophagitis: Secondary | ICD-10-CM | POA: Diagnosis present

## 2016-07-22 DIAGNOSIS — Z96659 Presence of unspecified artificial knee joint: Secondary | ICD-10-CM

## 2016-07-22 DIAGNOSIS — T84023A Instability of internal left knee prosthesis, initial encounter: Secondary | ICD-10-CM | POA: Diagnosis present

## 2016-07-22 DIAGNOSIS — Z7984 Long term (current) use of oral hypoglycemic drugs: Secondary | ICD-10-CM

## 2016-07-22 DIAGNOSIS — Z79899 Other long term (current) drug therapy: Secondary | ICD-10-CM | POA: Diagnosis not present

## 2016-07-22 DIAGNOSIS — I10 Essential (primary) hypertension: Secondary | ICD-10-CM | POA: Diagnosis present

## 2016-07-22 DIAGNOSIS — T84093A Other mechanical complication of internal left knee prosthesis, initial encounter: Secondary | ICD-10-CM

## 2016-07-22 DIAGNOSIS — M25562 Pain in left knee: Secondary | ICD-10-CM | POA: Diagnosis present

## 2016-07-22 HISTORY — PX: TOTAL KNEE REVISION: SHX996

## 2016-07-22 LAB — TYPE AND SCREEN
ABO/RH(D): A POS
Antibody Screen: NEGATIVE

## 2016-07-22 LAB — GLUCOSE, CAPILLARY
Glucose-Capillary: 85 mg/dL (ref 65–99)
Glucose-Capillary: 63 mg/dL — ABNORMAL LOW (ref 65–99)
Glucose-Capillary: 120 mg/dL — ABNORMAL HIGH (ref 65–99)
Glucose-Capillary: 86 mg/dL (ref 65–99)
Glucose-Capillary: 185 mg/dL — ABNORMAL HIGH (ref 65–99)

## 2016-07-22 SURGERY — TOTAL KNEE REVISION
Anesthesia: Spinal | Site: Knee | Laterality: Left

## 2016-07-22 MED ORDER — HYDROMORPHONE HCL 1 MG/ML IJ SOLN
0.5000 mg | INTRAMUSCULAR | Status: DC | PRN
  Administered 2016-07-22: 0.5 mg via INTRAVENOUS
  Filled 2016-07-22: qty 1

## 2016-07-22 MED ORDER — INSULIN ASPART 100 UNIT/ML ~~LOC~~ SOLN
0.0000 [IU] | Freq: Three times a day (TID) | SUBCUTANEOUS | Status: DC
  Administered 2016-07-23: 3 [IU] via SUBCUTANEOUS
  Administered 2016-07-23: 2 [IU] via SUBCUTANEOUS
  Filled 2016-07-22: qty 1

## 2016-07-22 MED ORDER — STERILE WATER FOR IRRIGATION IR SOLN
Status: DC | PRN
  Administered 2016-07-22: 2000 mL

## 2016-07-22 MED ORDER — FENTANYL CITRATE (PF) 100 MCG/2ML IJ SOLN
INTRAMUSCULAR | Status: DC | PRN
  Administered 2016-07-22 (×2): 50 ug via INTRAVENOUS

## 2016-07-22 MED ORDER — FLEET ENEMA 7-19 GM/118ML RE ENEM: 1.0000 | ENEMA | Freq: Once | RECTAL | Status: DC | PRN

## 2016-07-22 MED ORDER — PROPOFOL 10 MG/ML IV BOLUS
INTRAVENOUS | Status: AC
  Filled 2016-07-22: qty 20

## 2016-07-22 MED ORDER — ONDANSETRON HCL 4 MG PO TABS: 4.0000 mg | ORAL_TABLET | Freq: Four times a day (QID) | ORAL | Status: DC | PRN

## 2016-07-22 MED ORDER — MIDAZOLAM HCL 2 MG/2ML IJ SOLN
INTRAMUSCULAR | Status: AC
  Filled 2016-07-22: qty 2

## 2016-07-22 MED ORDER — TRAMADOL HCL 50 MG PO TABS
50.0000 mg | ORAL_TABLET | Freq: Four times a day (QID) | ORAL | Status: DC | PRN
  Administered 2016-07-23: 100 mg via ORAL
  Filled 2016-07-22: qty 2

## 2016-07-22 MED ORDER — CEFAZOLIN SODIUM-DEXTROSE 2-4 GM/100ML-% IV SOLN
INTRAVENOUS | Status: AC
  Filled 2016-07-22: qty 100

## 2016-07-22 MED ORDER — ONDANSETRON HCL 4 MG/2ML IJ SOLN
4.0000 mg | Freq: Four times a day (QID) | INTRAMUSCULAR | Status: DC | PRN
  Administered 2016-07-23: 4 mg via INTRAVENOUS
  Filled 2016-07-22: qty 2

## 2016-07-22 MED ORDER — DOCUSATE SODIUM 100 MG PO CAPS
100.0000 mg | ORAL_CAPSULE | Freq: Two times a day (BID) | ORAL | Status: DC
  Administered 2016-07-22 – 2016-07-24 (×4): 100 mg via ORAL
  Filled 2016-07-22 (×4): qty 1

## 2016-07-22 MED ORDER — POLYETHYLENE GLYCOL 3350 17 G PO PACK: 17.0000 g | PACK | Freq: Every day | ORAL | Status: DC | PRN

## 2016-07-22 MED ORDER — PHENYLEPHRINE 40 MCG/ML (10ML) SYRINGE FOR IV PUSH (FOR BLOOD PRESSURE SUPPORT)
PREFILLED_SYRINGE | INTRAVENOUS | Status: AC
  Filled 2016-07-22: qty 10

## 2016-07-22 MED ORDER — PHENOL 1.4 % MT LIQD
1.0000 | OROMUCOSAL | Status: DC | PRN
Start: 2016-07-22 — End: 2016-07-24

## 2016-07-22 MED ORDER — METHOCARBAMOL 1000 MG/10ML IJ SOLN
500.0000 mg | Freq: Four times a day (QID) | INTRAMUSCULAR | Status: DC | PRN
  Administered 2016-07-22: 500 mg via INTRAVENOUS
  Filled 2016-07-22: qty 5
  Filled 2016-07-22: qty 550

## 2016-07-22 MED ORDER — MENTHOL 3 MG MT LOZG: 1.0000 | LOZENGE | OROMUCOSAL | Status: DC | PRN

## 2016-07-22 MED ORDER — SODIUM CHLORIDE 0.9 % IJ SOLN
INTRAMUSCULAR | Status: DC | PRN
  Administered 2016-07-22: 30 mL

## 2016-07-22 MED ORDER — PHENYLEPHRINE HCL 10 MG/ML IJ SOLN
INTRAMUSCULAR | Status: DC | PRN
  Administered 2016-07-22 (×3): 80 ug via INTRAVENOUS

## 2016-07-22 MED ORDER — TRANEXAMIC ACID 1000 MG/10ML IV SOLN
INTRAVENOUS | Status: DC | PRN
  Administered 2016-07-22: 2000 mg via TOPICAL

## 2016-07-22 MED ORDER — HYDROMORPHONE HCL 1 MG/ML IJ SOLN
0.2500 mg | INTRAMUSCULAR | Status: DC | PRN
  Administered 2016-07-22 (×2): 0.25 mg via INTRAVENOUS
  Administered 2016-07-22: 0.5 mg via INTRAVENOUS

## 2016-07-22 MED ORDER — SODIUM CHLORIDE 0.9 % IJ SOLN
INTRAMUSCULAR | Status: AC
  Filled 2016-07-22: qty 50

## 2016-07-22 MED ORDER — PROMETHAZINE HCL 25 MG/ML IJ SOLN: 6.2500 mg | INTRAMUSCULAR | Status: DC | PRN

## 2016-07-22 MED ORDER — BUPIVACAINE HCL (PF) 0.25 % IJ SOLN
INTRAMUSCULAR | Status: AC
  Filled 2016-07-22: qty 30

## 2016-07-22 MED ORDER — SODIUM CHLORIDE 0.9 % IR SOLN
Status: DC | PRN
Start: 2016-07-22 — End: 2016-07-22
  Administered 2016-07-22: 1000 mL

## 2016-07-22 MED ORDER — ONDANSETRON HCL 4 MG/2ML IJ SOLN
INTRAMUSCULAR | Status: AC
  Filled 2016-07-22: qty 2

## 2016-07-22 MED ORDER — SODIUM CHLORIDE 0.9 % IV SOLN: INTRAVENOUS | Status: DC

## 2016-07-22 MED ORDER — 0.9 % SODIUM CHLORIDE (POUR BTL) OPTIME
TOPICAL | Status: DC | PRN
  Administered 2016-07-22: 1000 mL

## 2016-07-22 MED ORDER — SODIUM CHLORIDE 0.9 % IV SOLN
INTRAVENOUS | Status: DC
  Administered 2016-07-22: 21:00:00 via INTRAVENOUS

## 2016-07-22 MED ORDER — PROPOFOL 500 MG/50ML IV EMUL
INTRAVENOUS | Status: DC | PRN
  Administered 2016-07-22: 125 ug/kg/min via INTRAVENOUS

## 2016-07-22 MED ORDER — ACETAMINOPHEN 10 MG/ML IV SOLN
1000.0000 mg | Freq: Once | INTRAVENOUS | Status: AC
  Administered 2016-07-22: 1000 mg via INTRAVENOUS
  Filled 2016-07-22: qty 100

## 2016-07-22 MED ORDER — TELMISARTAN-HCTZ 80-12.5 MG PO TABS: 1.0000 | ORAL_TABLET | Freq: Every day | ORAL | Status: DC

## 2016-07-22 MED ORDER — HYDROMORPHONE HCL 1 MG/ML IJ SOLN
INTRAMUSCULAR | Status: AC
  Filled 2016-07-22: qty 1

## 2016-07-22 MED ORDER — DEXAMETHASONE SODIUM PHOSPHATE 10 MG/ML IJ SOLN
10.0000 mg | Freq: Once | INTRAMUSCULAR | Status: AC
  Administered 2016-07-23: 10 mg via INTRAVENOUS
  Filled 2016-07-22: qty 1

## 2016-07-22 MED ORDER — TRANEXAMIC ACID 1000 MG/10ML IV SOLN
2000.0000 mg | Freq: Once | INTRAVENOUS | Status: DC
  Filled 2016-07-22: qty 20

## 2016-07-22 MED ORDER — BUPIVACAINE HCL 0.25 % IJ SOLN
INTRAMUSCULAR | Status: DC | PRN
  Administered 2016-07-22: 20 mL

## 2016-07-22 MED ORDER — ACETAMINOPHEN 10 MG/ML IV SOLN
INTRAVENOUS | Status: AC
  Filled 2016-07-22: qty 100

## 2016-07-22 MED ORDER — MIDAZOLAM HCL 5 MG/5ML IJ SOLN
INTRAMUSCULAR | Status: DC | PRN
  Administered 2016-07-22: 2 mg via INTRAVENOUS

## 2016-07-22 MED ORDER — METOCLOPRAMIDE HCL 5 MG PO TABS: 5.0000 mg | ORAL_TABLET | Freq: Three times a day (TID) | ORAL | Status: DC | PRN

## 2016-07-22 MED ORDER — ONDANSETRON HCL 4 MG/2ML IJ SOLN
INTRAMUSCULAR | Status: DC | PRN
  Administered 2016-07-22: 4 mg via INTRAVENOUS

## 2016-07-22 MED ORDER — DEXTROSE 50 % IV SOLN
INTRAVENOUS | Status: AC
  Filled 2016-07-22: qty 50

## 2016-07-22 MED ORDER — METOCLOPRAMIDE HCL 5 MG/ML IJ SOLN: 5.0000 mg | Freq: Three times a day (TID) | INTRAMUSCULAR | Status: DC | PRN

## 2016-07-22 MED ORDER — PROPOFOL 10 MG/ML IV BOLUS
INTRAVENOUS | Status: AC
  Filled 2016-07-22: qty 40

## 2016-07-22 MED ORDER — CHLORHEXIDINE GLUCONATE 4 % EX LIQD
60.0000 mL | Freq: Once | CUTANEOUS | Status: DC
Start: 2016-07-22 — End: 2016-07-22

## 2016-07-22 MED ORDER — ACETAMINOPHEN 325 MG PO TABS: 650.0000 mg | ORAL_TABLET | Freq: Four times a day (QID) | ORAL | Status: DC | PRN

## 2016-07-22 MED ORDER — BUPIVACAINE HCL (PF) 0.75 % IJ SOLN
INTRAMUSCULAR | Status: DC | PRN
  Administered 2016-07-22: 1.6 mL via INTRATHECAL

## 2016-07-22 MED ORDER — ACETAMINOPHEN 650 MG RE SUPP: 650.0000 mg | Freq: Four times a day (QID) | RECTAL | Status: DC | PRN

## 2016-07-22 MED ORDER — BUPIVACAINE LIPOSOME 1.3 % IJ SUSP
20.0000 mL | Freq: Once | INTRAMUSCULAR | Status: DC
  Filled 2016-07-22: qty 20

## 2016-07-22 MED ORDER — ACETAMINOPHEN 500 MG PO TABS
1000.0000 mg | ORAL_TABLET | Freq: Four times a day (QID) | ORAL | Status: AC
  Administered 2016-07-23 (×4): 1000 mg via ORAL
  Filled 2016-07-22 (×4): qty 2

## 2016-07-22 MED ORDER — DEXAMETHASONE SODIUM PHOSPHATE 10 MG/ML IJ SOLN
10.0000 mg | Freq: Once | INTRAMUSCULAR | Status: AC
  Administered 2016-07-22: 10 mg via INTRAVENOUS

## 2016-07-22 MED ORDER — FENTANYL CITRATE (PF) 100 MCG/2ML IJ SOLN
INTRAMUSCULAR | Status: AC
  Filled 2016-07-22: qty 2

## 2016-07-22 MED ORDER — BISACODYL 10 MG RE SUPP: 10.0000 mg | Freq: Every day | RECTAL | Status: DC | PRN

## 2016-07-22 MED ORDER — CEFAZOLIN SODIUM-DEXTROSE 2-4 GM/100ML-% IV SOLN
2.0000 g | INTRAVENOUS | Status: AC
  Administered 2016-07-22: 2 g via INTRAVENOUS
  Filled 2016-07-22: qty 100

## 2016-07-22 MED ORDER — HYDROMORPHONE HCL 2 MG PO TABS
2.0000 mg | ORAL_TABLET | ORAL | Status: DC | PRN
  Administered 2016-07-22: 2 mg via ORAL
  Administered 2016-07-23 – 2016-07-24 (×5): 4 mg via ORAL
  Filled 2016-07-22 (×5): qty 2
  Filled 2016-07-22: qty 1

## 2016-07-22 MED ORDER — DEXAMETHASONE SODIUM PHOSPHATE 10 MG/ML IJ SOLN
INTRAMUSCULAR | Status: AC
  Filled 2016-07-22: qty 1

## 2016-07-22 MED ORDER — LACTATED RINGERS IV SOLN
INTRAVENOUS | Status: DC
  Administered 2016-07-22 (×2): via INTRAVENOUS

## 2016-07-22 MED ORDER — IRBESARTAN 150 MG PO TABS: 300.0000 mg | ORAL_TABLET | Freq: Every day | ORAL | Status: DC

## 2016-07-22 MED ORDER — BUPIVACAINE LIPOSOME 1.3 % IJ SUSP
INTRAMUSCULAR | Status: DC | PRN
  Administered 2016-07-22: 20 mL

## 2016-07-22 MED ORDER — RIVAROXABAN 10 MG PO TABS
10.0000 mg | ORAL_TABLET | Freq: Every day | ORAL | Status: DC
  Administered 2016-07-23 – 2016-07-24 (×2): 10 mg via ORAL
  Filled 2016-07-22 (×2): qty 1

## 2016-07-22 MED ORDER — DIPHENHYDRAMINE HCL 12.5 MG/5ML PO ELIX: 12.5000 mg | ORAL_SOLUTION | ORAL | Status: DC | PRN

## 2016-07-22 MED ORDER — DEXTROSE 50 % IV SOLN
25.0000 g | Freq: Once | INTRAVENOUS | Status: AC
  Administered 2016-07-22: 25 g via INTRAVENOUS

## 2016-07-22 MED ORDER — HYDROCHLOROTHIAZIDE 12.5 MG PO CAPS
12.5000 mg | ORAL_CAPSULE | Freq: Every day | ORAL | Status: DC
  Administered 2016-07-23 – 2016-07-24 (×2): 12.5 mg via ORAL
  Filled 2016-07-22 (×2): qty 1

## 2016-07-22 MED ORDER — CEFAZOLIN SODIUM-DEXTROSE 2-4 GM/100ML-% IV SOLN
2.0000 g | Freq: Four times a day (QID) | INTRAVENOUS | Status: AC
  Administered 2016-07-22 – 2016-07-23 (×2): 2 g via INTRAVENOUS
  Filled 2016-07-22 (×2): qty 100

## 2016-07-22 MED ORDER — METHOCARBAMOL 500 MG PO TABS
500.0000 mg | ORAL_TABLET | Freq: Four times a day (QID) | ORAL | Status: DC | PRN
  Administered 2016-07-23 – 2016-07-24 (×3): 500 mg via ORAL
  Filled 2016-07-22 (×3): qty 1

## 2016-07-22 SURGICAL SUPPLY — 70 items
ADAPTER BOLT FEMORAL +2/-2 (Knees) ×1 IMPLANT
ADPR FEM +2/-2 OFST BOLT (Knees) ×1 IMPLANT
ADPR FEM 5D STRL KN PFC SGM (Orthopedic Implant) ×1 IMPLANT
AUG FEM SZ3 4 CMB POST STRL LF (Knees) ×1 IMPLANT
AUG FEM SZ3 8 CMB POST STRL LF (Knees) ×1 IMPLANT
AUG FEM SZ3 8 DIST STRL KN LT (Knees) ×1 IMPLANT
AUG TIB SZ3 10 REV STP WDG (Knees) ×2 IMPLANT
AUGMENT DIST PFC 8MM (Knees) IMPLANT
BAG DECANTER FOR FLEXI CONT (MISCELLANEOUS) ×2 IMPLANT
BAG SPEC THK2 15X12 ZIP CLS (MISCELLANEOUS) ×1
BAG ZIPLOCK 12X15 (MISCELLANEOUS) ×1 IMPLANT
BANDAGE ACE 6X5 VEL STRL LF (GAUZE/BANDAGES/DRESSINGS) ×2 IMPLANT
BLADE SAG 18X100X1.27 (BLADE) ×2 IMPLANT
BLADE SAW SGTL 11.0X1.19X90.0M (BLADE) ×2 IMPLANT
BONE CEMENT GENTAMICIN (Cement) ×6 IMPLANT
CEMENT BONE GENTAMICIN 40 (Cement) ×3 IMPLANT
CEMENT RESTRICTOR DEPUY SZ 4 (Cement) ×1 IMPLANT
CLOTH BEACON ORANGE TIMEOUT ST (SAFETY) ×2 IMPLANT
CUFF TOURN SGL QUICK 34 (TOURNIQUET CUFF) ×2
CUFF TRNQT CYL 34X4X40X1 (TOURNIQUET CUFF) ×1 IMPLANT
DISAL AUG PFC 8MM (Knees) ×2 IMPLANT
DRAPE U-SHAPE 47X51 STRL (DRAPES) ×2 IMPLANT
DRSG ADAPTIC 3X8 NADH LF (GAUZE/BANDAGES/DRESSINGS) ×2 IMPLANT
DURAPREP 26ML APPLICATOR (WOUND CARE) ×2 IMPLANT
ELECT REM PT RETURN 9FT ADLT (ELECTROSURGICAL) ×2
ELECTRODE REM PT RTRN 9FT ADLT (ELECTROSURGICAL) ×1 IMPLANT
EVACUATOR 1/8 PVC DRAIN (DRAIN) ×2 IMPLANT
FEM TC3 PFC SZ3 LEFT (Orthopedic Implant) ×2 IMPLANT
FEMORAL ADAPTER (Orthopedic Implant) ×1 IMPLANT
FEMORAL TC3 PFC SZ3 LEFT (Orthopedic Implant) IMPLANT
GAUZE SPONGE 4X4 12PLY STRL (GAUZE/BANDAGES/DRESSINGS) ×2 IMPLANT
GLOVE BIO SURGEON STRL SZ7.5 (GLOVE) ×1 IMPLANT
GLOVE BIO SURGEON STRL SZ8 (GLOVE) ×2 IMPLANT
GLOVE BIOGEL PI IND STRL 7.0 (GLOVE) IMPLANT
GLOVE BIOGEL PI IND STRL 7.5 (GLOVE) IMPLANT
GLOVE BIOGEL PI IND STRL 8 (GLOVE) ×1 IMPLANT
GLOVE BIOGEL PI INDICATOR 7.0 (GLOVE) ×2
GLOVE BIOGEL PI INDICATOR 7.5 (GLOVE) ×3
GLOVE BIOGEL PI INDICATOR 8 (GLOVE) ×1
GLOVE SURG SS PI 7.5 STRL IVOR (GLOVE) ×1 IMPLANT
GOWN SPEC L3 XXLG W/TWL (GOWN DISPOSABLE) ×1 IMPLANT
GOWN STRL REUS W/TWL LRG LVL3 (GOWN DISPOSABLE) ×2 IMPLANT
GOWN STRL REUS W/TWL XL LVL3 (GOWN DISPOSABLE) ×2 IMPLANT
HANDPIECE INTERPULSE COAX TIP (DISPOSABLE) ×2
IMMOBILIZER KNEE 20 (SOFTGOODS) ×2
IMMOBILIZER KNEE 20 THIGH 36 (SOFTGOODS) ×1 IMPLANT
INSERT TC3 RP TIBIAL SZ 3.0 (Knees) ×1 IMPLANT
MANIFOLD NEPTUNE II (INSTRUMENTS) ×2 IMPLANT
PACK TOTAL KNEE CUSTOM (KITS) ×2 IMPLANT
PAD ABD 8X10 STRL (GAUZE/BANDAGES/DRESSINGS) ×1 IMPLANT
PADDING CAST COTTON 6X4 STRL (CAST SUPPLIES) ×5 IMPLANT
POSITIONER SURGICAL ARM (MISCELLANEOUS) ×2 IMPLANT
POST AVE PFC 4MM (Knees) ×1 IMPLANT
POST AVE PFC 8MM (Knees) ×1 IMPLANT
SET HNDPC FAN SPRY TIP SCT (DISPOSABLE) ×1 IMPLANT
STAPLER VISISTAT 35W (STAPLE) ×2 IMPLANT
STEM TIBIA PFC 13X30MM (Stem) ×1 IMPLANT
STEM UNIVERSAL REVISION 75X12 (Stem) ×1 IMPLANT
STRIP CLOSURE SKIN 1/2X4 (GAUZE/BANDAGES/DRESSINGS) ×2 IMPLANT
SUT VIC AB 2-0 CT1 27 (SUTURE) ×6
SUT VIC AB 2-0 CT1 TAPERPNT 27 (SUTURE) ×3 IMPLANT
SUT VLOC 180 0 24IN GS25 (SUTURE) ×2 IMPLANT
SYR 50ML LL SCALE MARK (SYRINGE) ×4 IMPLANT
TOWER CARTRIDGE SMART MIX (DISPOSABLE) ×2 IMPLANT
TRAY FOLEY CATH SILVER 14FR (SET/KITS/TRAYS/PACK) ×1 IMPLANT
TRAY REVISION SZ 3 (Knees) ×1 IMPLANT
TRAY SLEEVE CEM ML (Knees) ×1 IMPLANT
TUBE KAMVAC SUCTION (TUBING) IMPLANT
WEDGE SZ 3 10MM (Knees) ×2 IMPLANT
WRAP KNEE MAXI GEL POST OP (GAUZE/BANDAGES/DRESSINGS) ×1 IMPLANT

## 2016-07-22 NOTE — Anesthesia Postprocedure Evaluation (Signed)
Anesthesia Post Note  Patient: [name]  Procedure(s) Performed: Procedure(s) (LRB): TOTAL KNEE REVISION ARTHROPLASTY (Left)  Patient location during evaluation: PACU Anesthesia Type: Spinal Level of consciousness: oriented and awake and alert Pain management: pain level controlled Vital Signs Assessment: post-procedure vital signs reviewed and stable Respiratory status: spontaneous breathing, respiratory function stable and patient connected to nasal cannula oxygen Cardiovascular status: blood pressure returned to baseline and stable Postop Assessment: no headache and no backache Anesthetic complications: no    Last Vitals:  Vitals:   07/22/16 1820 07/22/16 1835  BP:  135/73  Pulse:  62  Resp:  12  Temp: 36.5 C 36.6 C    Last Pain:  Vitals:   07/22/16 1815  TempSrc:   PainSc: 3                  Jezebel Pollet S

## 2016-07-22 NOTE — Anesthesia Preprocedure Evaluation (Signed)
Anesthesia Evaluation  Patient identified by MRN, date of birth, ID band Patient awake    Reviewed: Allergy & Precautions, NPO status , Patient's Chart, lab work & pertinent test results  Airway Mallampati: II  TM Distance: >3 FB Neck ROM: Full    Dental no notable dental hx.    Pulmonary sleep apnea ,    Pulmonary exam normal breath sounds clear to auscultation       Cardiovascular hypertension, Normal cardiovascular exam Rhythm:Regular Rate:Normal     Neuro/Psych negative neurological ROS  negative psych ROS   GI/Hepatic Neg liver ROS, GERD  ,  Endo/Other  diabetesMorbid obesity  Renal/GU negative Renal ROS  negative genitourinary   Musculoskeletal negative musculoskeletal ROS (+)   Abdominal   Peds negative pediatric ROS (+)  Hematology negative hematology ROS (+)   Anesthesia Other Findings   Reproductive/Obstetrics negative OB ROS                             Anesthesia Physical Anesthesia Plan  ASA: III  Anesthesia Plan: Spinal   Post-op Pain Management:    Induction: Intravenous  Airway Management Planned: Simple Face Mask  Additional Equipment:   Intra-op Plan:   Post-operative Plan:   Informed Consent: I have reviewed the patients History and Physical, chart, labs and discussed the procedure including the risks, benefits and alternatives for the proposed anesthesia with the patient or authorized representative who has indicated his/her understanding and acceptance.   Dental advisory given  Plan Discussed with: CRNA and Surgeon  Anesthesia Plan Comments:         Anesthesia Quick Evaluation

## 2016-07-22 NOTE — Interval H&P Note (Signed)
History and Physical Interval Note:  07/22/2016 2:32 PM  [name]  has presented today for surgery, with the diagnosis of LOOSE LEFT TOTAL KNEE ARTHROPLASTY  The various methods of treatment have been discussed with the patient and family. After consideration of risks, benefits and other options for treatment, the patient has consented to  Procedure(s): TOTAL KNEE REVISION ARTHROPLASTY (Left) as a surgical intervention .  The patient's history has been reviewed, patient examined, no change in status, stable for surgery.  I have reviewed the patient's chart and labs.  Questions were answered to the patient's satisfaction.     Gearlean Alf

## 2016-07-22 NOTE — Progress Notes (Signed)
Hypoglycemic Event  CBG: 65  Treatment: D50 IV 50 mL  Symptoms: None  Possible Reasons for Event: Inadequate meal intake  Comments/MD notified:Rose    Azzie Roup, Ninfa Meeker

## 2016-07-22 NOTE — Anesthesia Procedure Notes (Signed)
Spinal  Patient location during procedure: OR Start time: 07/22/2016 2:45 PM End time: 07/22/2016 2:48 PM Staffing Resident/CRNA: Harle Stanford R Performed: resident/CRNA  Preanesthetic Checklist Completed: patient identified, site marked, surgical consent, pre-op evaluation, timeout performed, IV checked, risks and benefits discussed and monitors and equipment checked Spinal Block Patient position: sitting Prep: Betadine Patient monitoring: heart rate Approach: midline Location: L3-4 Injection technique: single-shot Needle Needle type: Sprotte  Needle gauge: 24 G Needle length: 10 cm Needle insertion depth: 7 cm Assessment Sensory level: T6 Additional Notes Timeout performed. SAB kit date checked. SAB without difficulty
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
## 2016-07-22 NOTE — Brief Op Note (Signed)
07/22/2016  4:37 PM  PATIENT:  [name]  60 y.o. female  PRE-OPERATIVE DIAGNOSIS:  LOOSE LEFT TOTAL KNEE ARTHROPLASTY  POST-OPERATIVE DIAGNOSIS:  LOOSE LEFT TOTAL KNEE ARTHROPLASTY  PROCEDURE:  Procedure(s): TOTAL KNEE REVISION ARTHROPLASTY (Left)  SURGEON:  Surgeon(s) and Role:    * Gaynelle Arabian, MD - Primary  PHYSICIAN ASSISTANT:   ASSISTANTS: Arlee Muslim, PA-C   ANESTHESIA:   spinal  EBL:  Total I/O In: 1000 [I.V.:1000] Out: 200 [Urine:100; Blood:100]  BLOOD ADMINISTERED:none  DRAINS: (Medium ) Hemovact drain(s) in the left knee with  Suction Open   LOCAL MEDICATIONS USED:  OTHER Exparel  COUNTS:  YES  TOURNIQUET: 44 minutes @ 300 mm Hg; down 8 minutes; up 20 minutes @ 300 mm Hg  DICTATION: .Other Dictation: Dictation Number [id number]  PLAN OF CARE: Admit to inpatient   PATIENT DISPOSITION:  PACU - hemodynamically stable.

## 2016-07-22 NOTE — Transfer of Care (Signed)
Immediate Anesthesia Transfer of Care Note  Patient: [name]  Procedure(s) Performed: Procedure(s): TOTAL KNEE REVISION ARTHROPLASTY (Left)  Patient Location: PACU  Anesthesia Type:Spinal  Level of Consciousness: sedated  Airway & Oxygen Therapy: Patient Spontanous Breathing and Patient connected to face mask oxygen  Post-op Assessment: Report given to RN and Post -op Vital signs reviewed and stable  Post vital signs: Reviewed and stable  Last Vitals:  Vitals:   07/22/16 1241  BP: 128/70  Pulse: 74  Resp: 16  Temp: 36.8 C    Last Pain:  Vitals:   07/22/16 1241  TempSrc: Oral         Complications: No apparent anesthesia complications

## 2016-07-23 ENCOUNTER — Encounter (HOSPITAL_COMMUNITY): Payer: Self-pay | Admitting: Orthopedic Surgery

## 2016-07-23 LAB — BASIC METABOLIC PANEL
Anion gap: 7 (ref 5–15)
BUN: 11 mg/dL (ref 6–20)
CO2: 24 mmol/L (ref 22–32)
Calcium: 9 mg/dL (ref 8.9–10.3)
Chloride: 104 mmol/L (ref 101–111)
Creatinine, Ser: 0.71 mg/dL (ref 0.44–1.00)
GFR calc Af Amer: >60 mL/min (ref >60)
GFR calc non Af Amer: >60 mL/min (ref >60)
Glucose, Bld: 171 mg/dL — ABNORMAL HIGH (ref 65–99)
Potassium: 3.9 mmol/L (ref 3.5–5.1)
Sodium: 135 mmol/L (ref 135–145)

## 2016-07-23 LAB — CBC
HCT: 28.8 % — ABNORMAL LOW (ref 36.0–46.0)
Hemoglobin: 9.4 g/dL — ABNORMAL LOW (ref 12.0–15.0)
MCH: 27.1 pg (ref 26.0–34.0)
MCHC: 32.6 g/dL (ref 30.0–36.0)
MCV: 83 fL (ref 78.0–100.0)
Platelets: 227 10*3/uL (ref 150–400)
RBC: 3.47 MIL/uL — ABNORMAL LOW (ref 3.87–5.11)
RDW: 14.5 % (ref 11.5–15.5)
WBC: 8.7 10*3/uL (ref 4.0–10.5)

## 2016-07-23 LAB — GLUCOSE, CAPILLARY
Glucose-Capillary: 138 mg/dL — ABNORMAL HIGH (ref 65–99)
Glucose-Capillary: 112 mg/dL — ABNORMAL HIGH (ref 65–99)
Glucose-Capillary: 152 mg/dL — ABNORMAL HIGH (ref 65–99)
Glucose-Capillary: 152 mg/dL — ABNORMAL HIGH (ref 65–99)

## 2016-07-23 MED ORDER — TELMISARTAN-HCTZ 80-12.5 MG PO TABS: 1.0000 | ORAL_TABLET | Freq: Every day | ORAL | Status: DC

## 2016-07-23 MED ORDER — TELMISARTAN 40 MG PO TABS
80.0000 mg | ORAL_TABLET | Freq: Every day | ORAL | Status: DC
  Filled 2016-07-23: qty 2

## 2016-07-23 MED ORDER — TELMISARTAN 40 MG PO TABS
80.0000 mg | ORAL_TABLET | Freq: Every day | ORAL | Status: DC
  Administered 2016-07-23 – 2016-07-24 (×2): 80 mg via ORAL
  Filled 2016-07-23 (×2): qty 2

## 2016-07-23 NOTE — NC FL2 (Signed)
[place] LEVEL OF CARE SCREENING TOOL     IDENTIFICATION  Patient Name: [name] Birthdate: 06/03/1956 Sex: female Admission Date (Current Location): 07/22/2016  [facility] and [place] Number:  Herbalist and Address:  [facility],  [place] [address], [place]      Provider Number: [id number]  Attending Physician Name and Address:  Gaynelle Arabian, MD  Relative Name and Phone Number:       Current Level of Care: Hospital Recommended Level of Care: [place] Prior Approval Number:    Date Approved/Denied:   PASRR Number: [id number]  Discharge Plan: SNF    Current Diagnoses: Patient Active Problem List   Diagnosis Date Noted  . Failed total knee, left, initial encounter ([place]) 07/22/2016  . Failed total knee arthroplasty ([place]) 07/22/2016  . Subcutaneous mass 07/08/2016  . Seroma, postoperative 06/25/2016  . Endometrial cancer ([place]) 06/11/2016  . Umbilical hernia without obstruction and without gangrene 06/11/2016  . Abnormal uterine bleeding 06/02/2016  . Abnormal perimenopausal bleeding 06/02/2016  . Contusion of breast, right 02/16/2016  . Costal margin pain 02/16/2016  . Right leg pain 02/16/2016  . Abdominal pain, epigastric 01/30/2016  . Candida infection of genital region 03/04/2015  . Conjunctivitis 10/31/2014  . Proptosis 10/31/2014  . Blurred vision, right eye 10/31/2014  . Acute upper respiratory infection 10/09/2014  . BMI 45.0-49.9, adult ([place]) 10/01/2014  . Arthritis pain of hip 10/01/2014  . Abscess of right breast (postoperative) 02/06/2014  . Breast mass, right 09/25/2013  . Hot flashes due to tamoxifen 09/04/2013  . Pain, lower extremity 07/19/2013  . Pain in joint, lower leg 06/26/2013  . Abdominal tenderness 02/08/2013  . Diabetes ([place]) 11/09/2012  . Rash 11/09/2012  . Lateral ventral hernia 10/14/2012  . Hidradenitis 10/14/2012  . Pulmonary nodule seen on imaging study  10/14/2012  . Left knee pain 03/31/2012  . Left wrist pain 03/31/2012  . Anxiety and depression 03/31/2012  . Diarrhea 02/22/2012  . Left hip pain 02/17/2012  . Morbid obesity ([place]) 02/17/2012  . Fibroid, uterine 12/08/2011    Class: History of  . Pelvic pain 11/17/2011    Class: History of  . Back pain 11/17/2011    Class: History of  . Eczema 10/27/2011  . Cancer of central portion of left female breast ([place]) 06/12/2011  . Fibromyalgia 02/13/2011  . Encounter for preventative adult health care exam with abnormal findings 02/08/2011  . Menorrhagia 12/25/2009    Class: History of  . GENITAL HERPES, HX OF 08/08/2009  . 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 HYPERLIPIDEMIA 05/11/2008  . ANEMIA-IRON DEFICIENCY 05/11/2008  . Obstructive sleep apnea 05/11/2008  . GERD 05/11/2008  . COLONIC POLYPS, HX OF 05/11/2008    Orientation RESPIRATION BLADDER Height & Weight     Self, Time, Situation, Place  Normal Continent Weight: 208 lb (94.3 kg) Height:  5' (152.4 cm)  BEHAVIORAL SYMPTOMS/MOOD NEUROLOGICAL BOWEL NUTRITION STATUS  Other (Comment) (no behaviors)   Continent Diet (carb modified)  AMBULATORY STATUS COMMUNICATION OF NEEDS Skin   Limited Assist Verbally Surgical wounds                       Personal Care Assistance Level of Assistance  Bathing, Feeding, Dressing Bathing Assistance: Limited assistance Feeding assistance: Independent Dressing Assistance: Limited assistance     Functional Limitations Info  Sight, Hearing, Speech Sight Info: Adequate Hearing Info: Adequate Speech Info: Adequate    SPECIAL CARE FACTORS  FREQUENCY  PT (By licensed PT), OT (By licensed OT)     PT Frequency: 5x wk OT Frequency: 5x wk            Contractures Contractures Info: Not present    Additional Factors Info  Code Status, Allergies, Insulin Sliding Scale Code Status Info: Full Code             Current Medications (07/23/2016):  This is the current hospital  active medication list Current Facility-Administered Medications  Medication Dose Route Frequency Provider Last Rate Last Dose  . 0.9 %  sodium chloride infusion   Intravenous Continuous Arlee Muslim, PA-C 20 mL/hr at 07/23/16 0600    . [START ON 07/24/2016] acetaminophen (TYLENOL) tablet 650 mg  650 mg Oral Q6H PRN Gaynelle Arabian, MD       Or  . Derrill Memo ON 07/24/2016] acetaminophen (TYLENOL) suppository 650 mg  650 mg Rectal Q6H PRN Gaynelle Arabian, MD      . acetaminophen (TYLENOL) tablet 1,000 mg  1,000 mg Oral Q6H Gaynelle Arabian, MD   1,000 mg at 07/23/16 0649  . bisacodyl (DULCOLAX) suppository 10 mg  10 mg Rectal Daily PRN Gaynelle Arabian, MD      . dexamethasone (DECADRON) injection 10 mg  10 mg Intravenous Once Gaynelle Arabian, MD      . diphenhydrAMINE (BENADRYL) 12.5 MG/5ML elixir 12.5-25 mg  12.5-25 mg Oral Q4H PRN Gaynelle Arabian, MD      . docusate sodium (COLACE) capsule 100 mg  100 mg Oral BID Gaynelle Arabian, MD   100 mg at 07/22/16 2141  . hydrochlorothiazide (MICROZIDE) capsule 12.5 mg  12.5 mg Oral Daily Arlee Muslim, PA-C      . HYDROmorphone (DILAUDID) injection 0.5 mg  0.5 mg Intravenous Q2H PRN Gaynelle Arabian, MD   0.5 mg at 07/22/16 2141  . HYDROmorphone (DILAUDID) tablet 2-4 mg  2-4 mg Oral Q4H PRN Gaynelle Arabian, MD   4 mg at 07/23/16 0059  . insulin aspart (novoLOG) injection 0-15 Units  0-15 Units Subcutaneous TID WC Gaynelle Arabian, MD   2 Units at 07/23/16 0845  . menthol-cetylpyridinium (CEPACOL) lozenge 3 mg  1 lozenge Oral PRN Gaynelle Arabian, MD       Or  . phenol (CHLORASEPTIC) mouth spray 1 spray  1 spray Mouth/Throat PRN Gaynelle Arabian, MD      . methocarbamol (ROBAXIN) tablet 500 mg  500 mg Oral Q6H PRN Gaynelle Arabian, MD   500 mg at 07/23/16 0059   Or  . methocarbamol (ROBAXIN) 500 mg in dextrose 5 % 50 mL IVPB  500 mg Intravenous Q6H PRN Gaynelle Arabian, MD   500 mg at 07/22/16 1731  . metoCLOPramide (REGLAN) tablet 5-10 mg  5-10 mg Oral Q8H PRN Gaynelle Arabian, MD       Or  .  metoCLOPramide (REGLAN) injection 5-10 mg  5-10 mg Intravenous Q8H PRN Gaynelle Arabian, MD      . ondansetron [facility]) tablet 4 mg  4 mg Oral Q6H PRN Gaynelle Arabian, MD       Or  . ondansetron [facility]) injection 4 mg  4 mg Intravenous Q6H PRN Gaynelle Arabian, MD   4 mg at 07/23/16 0110  . polyethylene glycol (MIRALAX / GLYCOLAX) packet 17 g  17 g Oral Daily PRN Gaynelle Arabian, MD      . rivaroxaban Alveda Reasons) tablet 10 mg  10 mg Oral Q breakfast Gaynelle Arabian, MD   10 mg at 07/23/16 0845  . sodium phosphate (FLEET) 7-19 GM/118ML  enema 1 enema  1 enema Rectal Once PRN Gaynelle Arabian, MD      . telmisartan (MICARDIS) tablet 80 mg  80 mg Oral Daily Gaynelle Arabian, MD      . traMADol Veatrice Bourbon) tablet 50-100 mg  50-100 mg Oral Q6H PRN Gaynelle Arabian, MD   100 mg at 07/23/16 6063     Discharge Medications: Please see discharge summary for a list of discharge medications.  Relevant Imaging Results:  Relevant Lab Results:   Additional Information SS # [id number]  Adelheid Hoggard, Randall An, LCSW

## 2016-07-23 NOTE — Evaluation (Signed)
Physical Therapy Evaluation Patient Details Name: [name] MRN: [id number] DOB: 05/18/1956 Today's Date: 07/23/2016   History of Present Illness  60 y.o. female admitted for L TKA revision 07/22/16. PMH of L TKA 2007, RA, IBS, HTN  Clinical Impression  Pt is s/p TKA revision resulting in the deficits listed below (see PT Problem List). Pt ambulated 26' with RW and performed TKA exercises with min A, she is independent with SLR. Good progress expected.  Pt will benefit from skilled PT to increase their independence and safety with mobility to allow discharge to the venue listed below.      Follow Up Recommendations SNF;Supervision for mobility/OOB- pt lives alone    Equipment Recommendations  Rolling walker with 5" wheels    Recommendations for Other Services       Precautions / Restrictions Precautions Precautions: Fall;Knee Precaution Comments: no h/o falls in past year Required Braces or Orthoses: Knee Immobilizer - Left Knee Immobilizer - Left: Discontinue once straight leg raise with < 10 degree lag Restrictions Weight Bearing Restrictions: No      Mobility  Bed Mobility Overal bed mobility: Modified Independent             General bed mobility comments: instructed pt to self assist LLE with RLE  Transfers Overall transfer level: Needs assistance Equipment used: Rolling walker (2 wheeled) Transfers: Sit to/from Stand Sit to Stand: Min guard         General transfer comment: VCs hand placement  Ambulation/Gait   Ambulation Distance (Feet): 70 Feet Assistive device: Rolling walker (2 wheeled) Gait Pattern/deviations: Step-to pattern;Trunk flexed;Antalgic;Decreased weight shift to left;Decreased step length - left;Decreased step length - right   Gait velocity interpretation: Below normal speed for age/gender General Gait Details: VCs for sequencing and to lift head, steady with no LOB  Stairs            Wheelchair Mobility    Modified  [place] (Stroke Patients Only)       Balance Overall balance assessment: Modified Independent                                           Pertinent Vitals/Pain Pain Assessment: 0-10 Pain Score: 4  Pain Location: L knee Pain Descriptors / Indicators: Sore Pain Intervention(s): Premedicated before session;Monitored during session;Limited activity within patient's tolerance;Ice applied    Home Living Family/patient expects to be discharged to:: Skilled nursing facility Living Arrangements: Alone     Home Access: Stairs to enter   Entrance Stairs-Number of Steps: 2 Home Layout: One level Home Equipment: Cane - quad      Prior Function Level of Independence: Independent with assistive device(s)               Hand Dominance        Extremity/Trunk Assessment   Upper Extremity Assessment: Overall WFL for tasks assessed           Lower Extremity Assessment: LLE deficits/detail   LLE Deficits / Details: SLR 3/5, ankle WNL, knee AAROM 5-45*  Cervical / Trunk Assessment: Normal  Communication   Communication: No difficulties  Cognition Arousal/Alertness: Awake/alert Behavior During Therapy: WFL for tasks assessed/performed Overall Cognitive Status: Within Functional Limits for tasks assessed                      General Comments      Exercises  Total Joint Exercises Ankle Circles/Pumps: AROM;Both;10 reps Quad Sets: Both;10 reps;Strengthening;Supine Heel Slides: AAROM;Left;10 reps;Supine Straight Leg Raises: AROM;Left;5 reps;Supine Long Arc Quad: AROM;Left;5 reps;Seated Goniometric ROM: 5-45* AAROM L knee   Assessment/Plan    PT Assessment    PT Problem List            PT Treatment Interventions      PT Goals (Current goals can be found in the Care Plan section)  Acute Rehab PT Goals Patient Stated Goal: return to work as Education officer, museum, be able to walk without pain  PT Goal Formulation: With patient Time For Goal  Achievement: 08/06/16 Potential to Achieve Goals: Good    Frequency     Barriers to discharge        Co-evaluation               End of Session Equipment Utilized During Treatment: Gait belt Activity Tolerance: Patient tolerated treatment well Patient left: with nursing/sitter in room;Other (comment) (at sink washing up with student nurse) Nurse Communication: Mobility status         Time: [phone] PT Time Calculation (min) (ACUTE ONLY): 52 min   Charges:   PT Evaluation $PT Eval Low Complexity: 1 Procedure PT Treatments $Gait Training: 8-22 mins $Therapeutic Exercise: 8-22 mins   PT G Codes:        Philomena Doheny 07/23/2016, 10:56 AM  [phone]

## 2016-07-23 NOTE — Clinical Social Work Placement (Signed)
   CLINICAL SOCIAL WORK PLACEMENT  NOTE  Date:  07/23/2016  Patient Details  Name: [name] MRN: [id number] Date of Birth: [birth year]  Clinical Social Work is seeking post-discharge placement for this patient at the [place] level of care (*CSW will initial, date and re-position this form in  chart as items are completed):  Yes   Patient/family provided with [place] Work Department's list of facilities offering this level of care within the geographic area requested by the patient (or if unable, by the patient's family).  Yes   Patient/family informed of their freedom to choose among providers that offer the needed level of care, that participate in Medicare, Medicaid or managed care program needed by the patient, have an available bed and are willing to accept the patient.  Yes   Patient/family informed of [facility]'s ownership interest in [facility] and [facility], as well as of the fact that they are under no obligation to receive care at these facilities.  PASRR submitted to EDS on 07/23/16     PASRR number received on 07/23/16     Existing PASRR number confirmed on       FL2 transmitted to all facilities in geographic area requested by pt/family on 07/23/16     FL2 transmitted to all facilities within larger geographic area on       Patient informed that his/her managed care company has contracts with or will negotiate with certain facilities, including the following:            Patient/family informed of bed offers received.  Patient chooses bed at       Physician recommends and patient chooses bed at      Patient to be transferred to   on  .  Patient to be transferred to facility by       Patient family notified on   of transfer.  Name of family member notified:        PHYSICIAN       Additional Comment:    _______________________________________________ Luretha Rued, [place]   [phone] 07/23/2016, 10:48 AM

## 2016-07-23 NOTE — Clinical Social Work Note (Signed)
Clinical Social Work Assessment  Patient Details  Name: [name] MRN: [id number] Date of Birth: Oct 01, 1956  Date of referral:  07/23/16               Reason for consult:  Facility Placement, Discharge Planning                Permission sought to share information with:  Facility Art therapist granted to share information::  Yes, Verbal Permission Granted  Name::        Agency::     Relationship::     Contact Information:     Housing/Transportation Living arrangements for the past 2 months:  Single Family Home Source of Information:  Patient Patient Interpreter Needed:  None Criminal Activity/Legal Involvement Pertinent to Current Situation/Hospitalization:  No - Comment as needed Significant Relationships:  Parents Lives with:  Self Do you feel safe going back to the place where you live?  No Need for family participation in patient care:  No (Coment)  Care giving concerns:  Pt's care cannot be managed at home following hospital d/c.   Social Worker assessment / plan:  Pt hospitalized on 07/22/16 from home for pre planned left total knee revision. CSW met with pt to assist with d/c planning. [facility] d[facility]c. Pt requesting [facility] ( [facility] for [facility]. SNF search initiated and requested SNF's contacted. Bed offers are pending. CSW will continue to follow to assist with d/c planning to SNF.  Employment status:  Therapist, music:  Managed Care PT Recommendations:  [place] / Referral to community resources:  [place]  Patient/Family's Response to care:  Pt feels rehab is needed at d/c.  Patient/Family's Understanding of and Emotional Response to Diagnosis, Current Treatment, and Prognosis:  Pt is aware of her medical status. " I was up all night. " Pt is motivated to work with therapy and looking forward to going to [place] at d/c.  Emotional  Assessment Appearance:  Appears stated age Attitude/Demeanor/Rapport:  Other (cooperative) Affect (typically observed):  Appropriate, Pleasant Orientation:  Oriented to Self, Oriented to Place, Oriented to  Time, Oriented to Situation Alcohol / Substance use:  Not Applicable Psych involvement (Current and /or in the community):  No (Comment)  Discharge Needs  Concerns to be addressed:    Readmission within the last 30 days:  No Current discharge risk:  None Barriers to Discharge:  No Barriers Identified   Luretha Rued, [place] 07/23/2016, 10:34 AM

## 2016-07-23 NOTE — Op Note (Signed)
[name], [name]          ACCOUNT NO.:  [id number]  MEDICAL RECORD NO.:  [id number]  LOCATION:  13                         FACILITY:  [facility]  PHYSICIAN:  Gaynelle Arabian, M.D.    DATE OF BIRTH:  [birth year]  DATE OF PROCEDURE:  07/22/2016 DATE OF DISCHARGE:                              OPERATIVE REPORT   PREOPERATIVE DIAGNOSIS:  Loose unstable left total knee arthroplasty.  POSTOPERATIVE DIAGNOSIS:  Loose unstable left total knee arthroplasty.  PROCEDURE:  Left total knee arthroplasty revision.  SURGEON:  Gaynelle Arabian, M.D.  ASSISTANT:  Alexzandrew L. Perkins, PA-C.  ANESTHESIA:  Spinal.  ESTIMATED BLOOD LOSS:  Minimal.  DRAINS:  Hemovac x1.  TOURNIQUET TIME:  Up 44 minutes at 300 mmHg, down 8 minutes, up additional 20 minutes at 300 mmHg.  COMPLICATIONS:  None.  CONDITION:  Stable to recovery.  BRIEF CLINICAL NOTE:  [name] is a 60 year old female with total knee arthroplasty done approximately 10 years ago.  She has gone on to develop significant instability and loosening with varus inclination. It has gotten progressively worse.  She had x-rays and bone scan suggestive of loosening and she presents now for total knee arthroplasty revision.  PROCEDURE IN DETAIL:  After successful administration of spinal anesthetic, a tourniquet was placed high on her left thigh and her left lower extremity prepped and draped in the usual sterile fashion. Extremity was wrapped in Esmarch, and tourniquet inflated to 300 mmHg. A midline incision was made with a 10 blade through the subcutaneous tissues to the level of the extensor mechanism.  A fresh blade was used to make a medial parapatellar arthrotomy.  Soft tissue over the proximal medial tibia subperiosteally elevated to the joint line with a knife and into the semimembranosus bursa with a Cobb elevator.  Soft tissue laterally was elevated with attention being paid to avoiding the patellar tendon on the tibial tubercle.   The patella was then everted and the knee flexed to 90 degrees.  There was gross instability in the knee.  I was able to dislocate the tibia anteriorly and remove the tibial polyethylene 12.5 mm thickness.  We then subluxed the tibia forward again and placed circumferential retractors in the proximal tibia.  An oscillating saw was used to disrupt the interface between the tibial component of bone and tibial component removed easily consistent of being loose.  The cement was then removed and the canal accessed. The tibial canal was thoroughly irrigated with saline.  I reamed up to 13 mm and placed in a 13 mm stem.  The extramedullary tibial cutting guide was then placed referencing proximally at the medial aspect of the tibial tubercle and distally along the second metatarsal axis and tibial crest.  Block was pinned to remove about 2 mm off the previously cut bone surface.  More bone was taken lateral than medial due to the fact that the tibia kinked into varus.  Size 3 was the most appropriate tibial component and then we prepared proximally with a modular drill and then a modular drill plus stem for the MBT revision tray.  I then also repaired for a 29-mm sleeve for rotational control.  We then prepared the femur.  The femoral component was  grossly loose and easily removed.  There was a lot of bone loss medially on the distal part of the femur, but fortunately the epicondyles were intact.  This accounted for the varus of the femur.  Cement was removed and the femoral canal was accessed.  I thoroughly irrigated the canal, then reamed up to 12 mm, which had an excellent press fit in the femoral canal.  The distal femoral cutting block was placed.  I removed 2 mm off the cut bone surface.  Laterally, I was 2 mm but had to go up 8 more mm to get anything medial, thus we needed 8 mm medial augment which would effectively also correct her alignment.  Size 3 was the most appropriate femoral  component.  We then placed the AP cutting block in a +2 position, effectively raising the stem to the anterior cortex of the femur.  The rotation was marked at the epicondylar axis confirmed by placing a spacer block in 90 degrees of flexion to create a rectangular flexion gap.  The rotation was marked and then the anterior, posterior and chamfer cuts made.  Barely any bone was taken.  I had to go up 4 mm posterolateral and 8 mm posteromedial to get any bone.  We thus placed augments of those corresponding and respective sinuses.  The intercondylar block was then placed and intercondylar cut made for TC3. We then placed the femoral trial.  The femoral trial was a size 3 TC3 femur with a 12 x 75 stem extension and a +2 position and 5 degrees of valgus.  There was an 8-mm posterior augment medially, 4-mm posterior augment laterally.  With this configuration, we had excellent fit on the femur.  On the tibial side, we placed 10 mm augments medial and lateral to build the joint line back up to normal with a size 3 MBT revision tray, 29 sleeve, 13 x 30 stem extension.  Trial inserts were placed starting with a 15.  With the 15, there was a tiny bit of varus-valgus so we placed a 17.5, which allowed for full extension with excellent AP and varus and valgus stability throughout full range of motion.  Patella was again everted.  It was felt that the patellar component was not worn and was well fixed.  Due to patelloplasty removing the soft tissue that was overlying the component which further confirmed that the component was still in good condition and was well fixed.  The patella tracked normally.  While the components were then assembled on the back table, the tourniquet was released for 8 minutes.  There was minimal bleeding and the bleeding that was encountered was stopped with electrocautery. After 8 minutes, we re-wrapped the leg in Esmarch and reinflated the tourniquet to 300 mmHg.  During  that time, the components were assembled on the back table.  The trial components were then removed and the cut bone surfaces were prepared with pulsatile lavage.  I sized for a cement restrictor and size 4 was most appropriate on the tibia.  The size 4 cement restrictor was placed in the appropriate depth in the tibial canal.  The cement was then mixed which was 3 batches of gentamicin impregnated cement.  Once mixed and then once ready for implantation, we injected into tibial canal and also placed on the cut tibial surface. The tibial components cemented into place, impacted and all extruded cement removed.  Femoral component was then cemented distally with a press-fit stem.  It was impacted and  all extruded cement removed.  A 17.5-mm trial insert was placed.  Knee held in full extension and extruded cement removed.  With the cement hardened in the knee, a permanent 17.5 mm TC3 rotating platform insert was placed into the tibial tray.  Full extension achieved with excellent varus-valgus and AP balance throughout full range of motion.  The wound was then copiously irrigated with saline solution.  The arthrotomy was then closed over a Hemovac drain with a running #1 V-Loc suture.  Flexion against gravity about 120 degrees.  Patella tracked normally.  The tourniquet was then released for a second tourniquet time of 20 minutes.  The subcu was then closed over a second limb of the Hemovac drain with interrupted 2-0 Vicryl, and skin closed with staples.  Incisions cleaned and dried and a bulky sterile dressing applied.  She was placed into a knee immobilizer, awakened and transported to recovery in stable condition.  Note that a surgical assistant was a medical necessity for this procedure to do it in a safe and expeditious manner.  Surgical assistant was necessary for retraction of vital ligaments and neurovascular structures and for proper positioning the limb to allow for safe removal of  the old implant and then safe and accurate placement of the new implant.     Gaynelle Arabian, M.D.     FA/MEDQ  D:  07/22/2016  T:  07/23/2016  Job:  [id number]

## 2016-07-23 NOTE — Progress Notes (Signed)
CSW assisting with d/c planning. Pt has chosen [facility] for ST Re[facility]. SNF will have a bed available tomorrow if pt is stable for d/c. CSW will continue to follow to assist with d/c planning to SNF.  Werner Lean LCSW [phone]

## 2016-07-23 NOTE — Progress Notes (Signed)
Physical Therapy Treatment Patient Details Name: [name] MRN: [id number] DOB: Jul 19, 1956 Today's Date: 07/23/2016    History of Present Illness 60 y.o. female admitted for L TKA revision 07/22/16. PMH of L TKA 2007, RA, IBS, HTN    PT Comments    Pt progressing well with mobility, she ambulated 120' with RW and performed TKA exercises.   Follow Up Recommendations  SNF;Supervision for mobility/OOB     Equipment Recommendations  Rolling walker with 5" wheels    Recommendations for Other Services       Precautions / Restrictions Precautions Precautions: Fall;Knee Precaution Comments: no h/o falls in past year Required Braces or Orthoses: Knee Immobilizer - Left Knee Immobilizer - Left: Discontinue once straight leg raise with < 10 degree lag Restrictions Weight Bearing Restrictions: No    Mobility  Bed Mobility Overal bed mobility: Modified Independent             General bed mobility comments: instructed pt to self assist LLE with RLE  Transfers Overall transfer level: Needs assistance Equipment used: Rolling walker (2 wheeled) Transfers: Sit to/from Stand Sit to Stand: Min guard         General transfer comment: VCs hand placement  Ambulation/Gait Ambulation/Gait assistance: Supervision Ambulation Distance (Feet): 120 Feet Assistive device: Rolling walker (2 wheeled) Gait Pattern/deviations: Step-to pattern;Antalgic   Gait velocity interpretation: Below normal speed for age/gender General Gait Details: VCs for sequencing and to lift head, steady with no LOB, pain limits full WB on LLE   Stairs            Wheelchair Mobility    Modified Rankin (Stroke Patients Only)       Balance Overall balance assessment: Modified Independent                                  Cognition Arousal/Alertness: Awake/alert Behavior During Therapy: WFL for tasks assessed/performed Overall Cognitive Status: Within Functional Limits  for tasks assessed                      Exercises Total Joint Exercises Ankle Circles/Pumps: AROM;Both;10 reps Quad Sets: Both;10 reps;Strengthening;Supine Short Arc Quad: AROM;Left;10 reps;Supine Heel Slides: AAROM;Left;10 reps;Supine Straight Leg Raises: AROM;Left;5 reps;Supine Long Arc Quad: AROM;Left;5 reps;Seated Goniometric ROM: 5-45* AAROM L knee    General Comments        Pertinent Vitals/Pain Pain Assessment: 0-10 Pain Score: 6  Pain Location: L knee Pain Descriptors / Indicators: Sore Pain Intervention(s): Limited activity within patient's tolerance;Monitored during session;Premedicated before session;Ice applied    Home Living Family/patient expects to be discharged to:: Skilled nursing facility Living Arrangements: Alone     Home Access: Stairs to enter   Home Layout: One level Home Equipment: Cane - quad      Prior Function Level of Independence: Independent with assistive device(s)          PT Goals (current goals can now be found in the care plan section) Acute Rehab PT Goals Patient Stated Goal: return to work as Education officer, museum, be able to walk without pain  PT Goal Formulation: With patient Time For Goal Achievement: 08/06/16 Potential to Achieve Goals: Good Progress towards PT goals: Progressing toward goals    Frequency    7X/week      PT Plan Current plan remains appropriate    Co-evaluation             End of  Session Equipment Utilized During Treatment: Gait belt Activity Tolerance: Patient tolerated treatment well Patient left: with nursing/sitter in room;Other (comment) (in bathroom with student nurse)     Time: 6578-4696 PT Time Calculation (min) (ACUTE ONLY): 28 min  Charges:  $Gait Training: 8-22 mins $Therapeutic Exercise: 8-22 mins                    G Codes:      Philomena Doheny 07/23/2016, 1:51 PM [phone]

## 2016-07-23 NOTE — Progress Notes (Signed)
OT Cancellation Note  Patient Details Name: [name] MRN: [id number] DOB: [birth year]   Cancelled Treatment:    Reason Eval/Treat Not Completed: Other (comment) -- Note d/c plan is SNF. Will defer all OT needs to SNF. Thank you.  Cheron Pasquarelli A 07/23/2016, 8:41 AM

## 2016-07-23 NOTE — Care Management Note (Signed)
Case Management Note  Patient Details  Name: [name] MRN: [id number] Date of Birth: 1956-01-06  Subjective/Objective:                  TOTAL KNEE REVISION ARTHROPLASTY (Left) Action/Plan: Discharge planning Expected Discharge Date:                  Expected Discharge Plan:  [place]  In-House Referral:     Discharge planning Services  CM Consult  Post Acute Care Choice:    Choice offered to:     DME Arranged:    DME Agency:     HH Arranged:    [place] Agency:     Status of Service:  Completed, signed off  If discussed at [employer], dates discussed:    Additional Comments: CM notes plan is for pt to go to SNF for rehab; CSW arranging.  No other CM needs were communicated. Dellie Catholic, RN 07/23/2016, 1:31 PM
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
## 2016-07-24 LAB — BASIC METABOLIC PANEL WITH GFR
Anion gap: 6 (ref 5–15)
BUN: 16 mg/dL (ref 6–20)
CO2: 27 mmol/L (ref 22–32)
Calcium: 9.2 mg/dL (ref 8.9–10.3)
Chloride: 104 mmol/L (ref 101–111)
Creatinine, Ser: 0.68 mg/dL (ref 0.44–1.00)
GFR calc Af Amer: >60 mL/min
GFR calc non Af Amer: >60 mL/min
Glucose, Bld: 121 mg/dL — ABNORMAL HIGH (ref 65–99)
Potassium: 3.8 mmol/L (ref 3.5–5.1)
Sodium: 137 mmol/L (ref 135–145)

## 2016-07-24 LAB — CBC
HCT: 27.4 % — ABNORMAL LOW (ref 36.0–46.0)
Hemoglobin: 8.9 g/dL — ABNORMAL LOW (ref 12.0–15.0)
MCH: 27.6 pg (ref 26.0–34.0)
MCHC: 32.5 g/dL (ref 30.0–36.0)
MCV: 84.8 fL (ref 78.0–100.0)
Platelets: 244 10*3/uL (ref 150–400)
RBC: 3.23 MIL/uL — ABNORMAL LOW (ref 3.87–5.11)
RDW: 14.9 % (ref 11.5–15.5)
WBC: 9.9 10*3/uL (ref 4.0–10.5)

## 2016-07-24 LAB — GLUCOSE, CAPILLARY: Glucose-Capillary: 107 mg/dL — ABNORMAL HIGH (ref 65–99)

## 2016-07-24 MED ORDER — METOCLOPRAMIDE HCL 5 MG PO TABS
5.0000 mg | ORAL_TABLET | Freq: Three times a day (TID) | ORAL | 0 refills | Status: DC | PRN
Start: 2016-07-24 — End: 2016-08-20

## 2016-07-24 MED ORDER — METHOCARBAMOL 500 MG PO TABS: 500.0000 mg | ORAL_TABLET | Freq: Four times a day (QID) | ORAL | 0 refills | Status: DC | PRN

## 2016-07-24 MED ORDER — BISACODYL 10 MG RE SUPP: 10.0000 mg | Freq: Every day | RECTAL | 0 refills | Status: DC | PRN

## 2016-07-24 MED ORDER — ACETAMINOPHEN 325 MG PO TABS: 650.0000 mg | ORAL_TABLET | Freq: Four times a day (QID) | ORAL | 0 refills | Status: DC | PRN

## 2016-07-24 MED ORDER — ONDANSETRON HCL 4 MG PO TABS: 4.0000 mg | ORAL_TABLET | Freq: Four times a day (QID) | ORAL | 0 refills | Status: DC | PRN

## 2016-07-24 MED ORDER — RIVAROXABAN 10 MG PO TABS: 10.0000 mg | ORAL_TABLET | Freq: Every day | ORAL | 0 refills | Status: DC

## 2016-07-24 MED ORDER — DIPHENHYDRAMINE HCL 12.5 MG/5ML PO ELIX
12.5000 mg | ORAL_SOLUTION | ORAL | 0 refills | Status: DC | PRN
Start: 2016-07-24 — End: 2016-08-12

## 2016-07-24 MED ORDER — FLEET ENEMA 7-19 GM/118ML RE ENEM: 1.0000 | ENEMA | Freq: Once | RECTAL | 0 refills | Status: DC | PRN

## 2016-07-24 MED ORDER — DOCUSATE SODIUM 100 MG PO CAPS: 100.0000 mg | ORAL_CAPSULE | Freq: Two times a day (BID) | ORAL | 0 refills | Status: DC

## 2016-07-24 MED ORDER — POLYETHYLENE GLYCOL 3350 17 G PO PACK: 17.0000 g | PACK | Freq: Every day | ORAL | 0 refills | Status: DC | PRN

## 2016-07-24 MED ORDER — TRAMADOL HCL 50 MG PO TABS
50.0000 mg | ORAL_TABLET | Freq: Four times a day (QID) | ORAL | 1 refills | Status: DC | PRN
Start: 2016-07-24 — End: 2019-04-28

## 2016-07-24 MED ORDER — HYDROMORPHONE HCL 2 MG PO TABS: 2.0000 mg | ORAL_TABLET | ORAL | 0 refills | Status: DC | PRN

## 2016-07-24 NOTE — Progress Notes (Signed)
Report called to Nurse Dawn at [facility]. Family is transporting patient at this time.

## 2016-07-24 NOTE — Discharge Summary (Signed)
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   diphenhydrAMINE 12.5 MG/5ML elixir Commonly known as:  BENADRYL Take 5-10 mLs (12.5-25 mg total) by mouth every 4 (four) hours as needed for itching.   docusate sodium 100 MG capsule Commonly known as:  COLACE Take 1 capsule (100  mg total) by mouth 2 (two) times daily.   HYDROmorphone 2 MG tablet Commonly known as:  DILAUDID Take 1-2 tablets (2-4 mg total) by mouth every 4 (four) hours as needed for moderate pain or severe pain.   metFORMIN 500 MG 24 hr tablet Commonly known as:  GLUCOPHAGE-XR TAKE 1 TABLET BY MOUTH EVERY DAY WITH BREAKFAST What changed:  See the new instructions.   methocarbamol 500 MG tablet Commonly known as:  ROBAXIN Take 1 tablet (500 mg total) by mouth every 6 (six) hours as needed for muscle spasms.   metoCLOPramide 5 MG tablet Commonly known as:  REGLAN Take 1-2  tablets (5-10 mg total) by mouth every 8 (eight) hours as needed for nausea (if ondansetron (ZOFRAN) ineffective.).   ondansetron 4 MG tablet Commonly known as:  ZOFRAN Take 1 tablet (4 mg total) by mouth every 6 (six) hours as needed for nausea.   polyethylene glycol packet Commonly known as:  MIRALAX / GLYCOLAX Take 17 g by mouth daily as needed for mild constipation.   rivaroxaban 10 MG Tabs tablet Commonly known as:  XARELTO Take 1 tablet (10 mg total) by mouth daily with breakfast. Take Xarelto for two and a half more weeks, then discontinue Xarelto. Once the patient has completed the blood thinner regimen, then take a Baby 81 mg Aspirin daily for three more weeks.   sodium phosphate 7-19 GM/118ML Enem Place 133 mLs (1 enema total) rectally once as needed for severe constipation.   tacrolimus 0.1 % ointment Commonly known as:  PROTOPIC Apply 1 application topically daily as needed (eczema).   telmisartan-hydrochlorothiazide 80-12.5 MG tablet Commonly known as:  MICARDIS HCT TAKE 1 TABLET EVERY DAY   traMADol 50 MG tablet Commonly known as:  ULTRAM Take 1-2 tablets (50-100 mg total) by mouth every 6 (six) hours as needed for moderate pain. What changed:  how much to take      Follow-up Information    Gearlean Alf, MD. Schedule an appointment as soon as possible for a visit on 08/04/2016.   Specialty:  Orthopedic Surgery Why:  Call office ASAP to setup appointment on Tuesday 08/04/2016 with Dr. Wynelle Link. Contact information: [address] [place] [address] [phone]           Signed: Arlee Muslim, PA-C Orthopaedic Surgery 07/24/2016, 8:50 AM
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
## 2016-07-24 NOTE — Clinical Social Work Placement (Signed)
   CLINICAL SOCIAL WORK PLACEMENT  NOTE  Date:  07/24/2016  Patient Details  Name: [name] MRN: [id number] Date of Birth: 07/07/1956  Clinical Social Work is seeking post-discharge placement for this patient at the [place] level of care (*CSW will initial, date and re-position this form in  chart as items are completed):  Yes   Patient/family provided with [facility] Work Department's list of facilities offering this level of care within the geographic area requested by the patient (or if unable, by the patient's family).  Yes   Patient/family informed of their freedom to choose among providers that offer the needed level of care, that participate in Medicare, Medicaid or managed care program needed by the patient, have an available bed and are willing to accept the patient.  Yes   Patient/family informed of [facility]'s ownership interest in [facility] and [facility], as well as of the fact that they are under no obligation to receive care at these facilities.  PASRR submitted to EDS on 07/23/16     PASRR number received on 07/23/16     Existing PASRR number confirmed on       FL2 transmitted to all facilities in geographic area requested by pt/family on 07/23/16     FL2 transmitted to all facilities within larger geographic area on       Patient informed that his/her managed care company has contracts with or will negotiate with certain facilities, including the following:        Yes   Patient/family informed of bed offers received.  Patient chooses bed at [facility]     Physician recommends and patient chooses bed at      Patient to be transferred to [facility] on 07/24/16.  Patient to be transferred to facility by car     Patient family notified on 07/24/16 of transfer.  Name of family member notified:  Mother     PHYSICIAN       Additional Comment: Pt / mother are in  agreement with d/c to [facility] today. PT approved transport by car. D/C Summary sent to SNF for review. Scripts included in d/c packet. # for report provided to nsg. D/C packet provided to pt.   _______________________________________________ Luretha Rued, [place] 07/24/2016, 3:14 PM

## 2016-07-24 NOTE — Discharge Instructions (Signed)
° °Dr. Frank Aluisio °Total Joint Specialist °[facility] °[address] °[place], [place] [address] °[phone] ° °TOTAL KNEE REPLACEMENT POSTOPERATIVE DIRECTIONS ° °Knee Rehabilitation, Guidelines Following Surgery  °Results after knee surgery are often greatly improved when you follow the exercise, range of motion and muscle strengthening exercises prescribed by your doctor. Safety measures are also important to protect the knee from further injury. Any time any of these exercises cause you to have increased pain or swelling in your knee joint, decrease the amount until you are comfortable again and slowly increase them. If you have problems or questions, call your caregiver or physical therapist for advice.  ° °HOME CARE INSTRUCTIONS  °Remove items at home which could result in a fall. This includes throw rugs or furniture in walking pathways.  °· ICE to the affected knee every three hours for 30 minutes at a time and then as needed for pain and swelling.  Continue to use ice on the knee for pain and swelling from surgery. You may notice swelling that will progress down to the foot and ankle.  This is normal after surgery.  Elevate the leg when you are not up walking on it.   °· Continue to use the breathing machine which will help keep your temperature down.  It is common for your temperature to cycle up and down following surgery, especially at night when you are not up moving around and exerting yourself.  The breathing machine keeps your lungs expanded and your temperature down. °· Do not place pillow under knee, focus on keeping the knee straight while resting ° °DIET °You may resume your previous home diet once your are discharged from the hospital. ° °DRESSING / WOUND CARE / SHOWERING °You may shower 3 days after surgery, but keep the wounds dry during showering.  You may use an occlusive plastic wrap (Press'n Seal for example), NO SOAKING/SUBMERGING IN THE BATHTUB.  If the  bandage gets wet, change with a clean dry gauze.  If the incision gets wet, pat the wound dry with a clean towel. °You may start showering once you are discharged home but do not submerge the incision under water. Just pat the incision dry and apply a dry gauze dressing on daily. °Change the surgical dressing daily and reapply a dry dressing each time. ° °ACTIVITY °Walk with your walker as instructed. °Use walker as long as suggested by your caregivers. °Avoid periods of inactivity such as sitting longer than an hour when not asleep. This helps prevent blood clots.  °You may resume a sexual relationship in one month or when given the OK by your doctor.  °You may return to work once you are cleared by your doctor.  °Do not drive a car for 6 weeks or until released by you surgeon.  °Do not drive while taking narcotics. ° °WEIGHT BEARING °Weight bearing as tolerated with assist device (walker, cane, etc) as directed, use it as long as suggested by your surgeon or therapist, typically at least 4-6 weeks. ° °POSTOPERATIVE CONSTIPATION PROTOCOL °Constipation - defined medically as fewer than three stools per week and severe constipation as less than one stool per week. ° °One of the most common issues patients have following surgery is constipation.  Even if you have a regular bowel pattern at home, your normal regimen is likely to be disrupted due to multiple reasons following surgery.  Combination of anesthesia, postoperative narcotics, change in appetite and fluid intake all can affect your bowels.    In order to avoid complications following surgery, here are some recommendations in order to help you during your recovery period. ° °Colace (docusate) - Pick up an over-the-counter form of Colace or another stool softener and take twice a day as long as you are requiring postoperative pain medications.  Take with a full glass of water daily.  If you experience loose stools or diarrhea, hold the colace until you stool forms  back up.  If your symptoms do not get better within 1 week or if they get worse, check with your doctor. ° °Dulcolax (bisacodyl) - Pick up over-the-counter and take as directed by the product packaging as needed to assist with the movement of your bowels.  Take with a full glass of water.  Use this product as needed if not relieved by Colace only.  ° °MiraLax (polyethylene glycol) - Pick up over-the-counter to have on hand.  MiraLax is a solution that will increase the amount of water in your bowels to assist with bowel movements.  Take as directed and can mix with a glass of water, juice, soda, coffee, or tea.  Take if you go more than two days without a movement. °Do not use MiraLax more than once per day. Call your doctor if you are still constipated or irregular after using this medication for 7 days in a row. ° °If you continue to have problems with postoperative constipation, please contact the office for further assistance and recommendations.  If you experience "the worst abdominal pain ever" or develop nausea or vomiting, please contact the office immediatly for further recommendations for treatment. ° °ITCHING ° If you experience itching with your medications, try taking only a single pain pill, or even half a pain pill at a time.  You can also use Benadryl over the counter for itching or also to help with sleep.  ° °TED HOSE STOCKINGS °Wear the elastic stockings on both legs for three weeks following surgery during the day but you may remove then at night for sleeping. ° °MEDICATIONS °See your medication summary on the “After Visit Summary” that the nursing staff will review with you prior to discharge.  You may have some home medications which will be placed on hold until you complete the course of blood thinner medication.  It is important for you to complete the blood thinner medication as prescribed by your surgeon.  Continue your approved medications as instructed at time of  discharge. ° °PRECAUTIONS °If you experience chest pain or shortness of breath - call 911 immediately for transfer to the hospital emergency department.  °If you develop a fever greater that 101 F, purulent drainage from wound, increased redness or drainage from wound, foul odor from the wound/dressing, or calf pain - CONTACT YOUR SURGEON.   °                                                °FOLLOW-UP APPOINTMENTS °Make sure you keep all of your appointments after your operation with your surgeon and caregivers. You should call the office at the above phone number and make an appointment for approximately two weeks after the date of your surgery or on the date instructed by your surgeon outlined in the "After Visit Summary". ° ° °RANGE OF MOTION AND STRENGTHENING EXERCISES  °Rehabilitation of the knee is important following a knee injury or   an operation. After just a few days of immobilization, the muscles of the thigh which control the knee become weakened and shrink (atrophy). Knee exercises are designed to build up the tone and strength of the thigh muscles and to improve knee motion. Often times heat used for twenty to thirty minutes before working out will loosen up your tissues and help with improving the range of motion but do not use heat for the first two weeks following surgery. These exercises can be done on a training (exercise) mat, on the floor, on a table or on a bed. Use what ever works the best and is most comfortable for you Knee exercises include:  °Leg Lifts - While your knee is still immobilized in a splint or cast, you can do straight leg raises. Lift the leg to 60 degrees, hold for 3 sec, and slowly lower the leg. Repeat 10-20 times 2-3 times daily. Perform this exercise against resistance later as your knee gets better.  °Quad and Hamstring Sets - Tighten up the muscle on the front of the thigh (Quad) and hold for 5-10 sec. Repeat this 10-20 times hourly. Hamstring sets are done by pushing the  foot backward against an object and holding for 5-10 sec. Repeat as with quad sets.  °· Leg Slides: Lying on your back, slowly slide your foot toward your buttocks, bending your knee up off the floor (only go as far as is comfortable). Then slowly slide your foot back down until your leg is flat on the floor again. °· Angel Wings: Lying on your back spread your legs to the side as far apart as you can without causing discomfort.  °A rehabilitation program following serious knee injuries can speed recovery and prevent re-injury in the future due to weakened muscles. Contact your doctor or a physical therapist for more information on knee rehabilitation.  ° °IF YOU ARE TRANSFERRED TO A SKILLED REHAB FACILITY °If the patient is transferred to a skilled rehab facility following release from the hospital, a list of the current medications will be sent to the facility for the patient to continue.  When discharged from the skilled rehab facility, please have the facility set up the patient's Home Health Physical Therapy prior to being released. Also, the skilled facility will be responsible for providing the patient with their medications at time of release from the facility to include their pain medication, the muscle relaxants, and their blood thinner medication. If the patient is still at the rehab facility at time of the two week follow up appointment, the skilled rehab facility will also need to assist the patient in arranging follow up appointment in our office and any transportation needs. ° °MAKE SURE YOU:  °Understand these instructions.  °Get help right away if you are not doing well or get worse.  ° ° °Pick up stool softner and laxative for home use following surgery while on pain medications. °Do not submerge incision under water. °Please use good hand washing techniques while changing dressing each day. °May shower starting three days after surgery. °Please use a clean towel to pat the incision dry following  showers. °Continue to use ice for pain and swelling after surgery. °Do not use any lotions or creams on the incision until instructed by your surgeon. ° °Take Xarelto for two and a half more weeks, then discontinue Xarelto. °Once the patient has completed the blood thinner regimen, then take a Baby 81 mg Aspirin daily for three more weeks. ° ° °Information   on my medicine - XARELTO® (Rivaroxaban) ° °This medication education was reviewed with me or my healthcare representative as part of my discharge preparation.  The pharmacist that spoke with me during my hospital stay was:  Nicole Dunn, Student-PharmD ° °Why was Xarelto® prescribed for you? °Xarelto® was prescribed for you to reduce the risk of blood clots forming after orthopedic surgery. The medical term for these abnormal blood clots is venous thromboembolism (VTE). ° °What do you need to know about xarelto® ? °Take your Xarelto® ONCE DAILY at the same time every day. °You may take it either with or without food. ° °If you have difficulty swallowing the tablet whole, you may crush it and mix in applesauce just prior to taking your dose. ° °Take Xarelto® exactly as prescribed by your doctor and DO NOT stop taking Xarelto® without talking to the doctor who prescribed the medication.  Stopping without other VTE prevention medication to take the place of Xarelto® may increase your risk of developing a clot. ° °After discharge, you should have regular check-up appointments with your healthcare provider that is prescribing your Xarelto®.   ° °What do you do if you miss a dose? °If you miss a dose, take it as soon as you remember on the same day then continue your regularly scheduled once daily regimen the next day. Do not take two doses of Xarelto® on the same day.  ° °Important Safety Information °A possible side effect of Xarelto® is bleeding. You should call your healthcare provider right away if you experience any of the following: °? Bleeding from an injury or  your nose that does not stop. °? Unusual colored urine (red or dark brown) or unusual colored stools (red or black). °? Unusual bruising for unknown reasons. °? A serious fall or if you hit your head (even if there is no bleeding). ° °Some medicines may interact with Xarelto® and might increase your risk of bleeding while on Xarelto®. To help avoid this, consult your healthcare provider or pharmacist prior to using any new prescription or non-prescription medications, including herbals, vitamins, non-steroidal anti-inflammatory drugs (NSAIDs) and supplements. ° °This website has more information on Xarelto®: www.xarelto.com. ° ° ° °

## 2016-08-11 ENCOUNTER — Telehealth: Payer: Self-pay

## 2016-08-11 NOTE — Telephone Encounter (Addendum)
Late entry for 08/10/2016 : Incoming call , patient states she has some "pus and bloody " drainage to her "right" lower incision this past Saturday. Patient states the drainage has currently decreased , but has "two" open areas "holes" around the incision. Patient also states that the skin around the site is "red" and her skin is "peeling" Patient denies pain , fever or chills currently.Patient states that the U/S she had completed ,did not find the "knot" she noted ,but the doctor stated it was "scar" tissue. Melissa Cross, APNP updated , orders received to have the patient come in to be evaluated by Dr Everitt Amber. The patient was scheduled for Wednesday August 12, 2016 at 9:15 AM . Patient agreed to plan ,denied further questions at this time.
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
## 2016-08-12 ENCOUNTER — Ambulatory Visit: Payer: 59 | Attending: Gynecologic Oncology | Admitting: Gynecologic Oncology

## 2016-08-12 ENCOUNTER — Encounter: Payer: Self-pay | Admitting: Gynecologic Oncology

## 2016-08-12 ENCOUNTER — Encounter: Payer: 59 | Admitting: Internal Medicine

## 2016-08-12 VITALS — BP 130/78 | HR 73 | Temp 98.3°F | Resp 18 | Ht 60.0 in | Wt 209.3 lb

## 2016-08-12 DIAGNOSIS — G4733 Obstructive sleep apnea (adult) (pediatric): Secondary | ICD-10-CM | POA: Diagnosis not present

## 2016-08-12 DIAGNOSIS — Z9889 Other specified postprocedural states: Secondary | ICD-10-CM | POA: Diagnosis not present

## 2016-08-12 DIAGNOSIS — E119 Type 2 diabetes mellitus without complications: Secondary | ICD-10-CM | POA: Insufficient documentation

## 2016-08-12 DIAGNOSIS — I10 Essential (primary) hypertension: Secondary | ICD-10-CM | POA: Diagnosis not present

## 2016-08-12 DIAGNOSIS — Z7901 Long term (current) use of anticoagulants: Secondary | ICD-10-CM | POA: Diagnosis not present

## 2016-08-12 DIAGNOSIS — T8189XA Other complications of procedures, not elsewhere classified, initial encounter: Secondary | ICD-10-CM

## 2016-08-12 DIAGNOSIS — Z90710 Acquired absence of both cervix and uterus: Secondary | ICD-10-CM | POA: Diagnosis not present

## 2016-08-12 DIAGNOSIS — Z79899 Other long term (current) drug therapy: Secondary | ICD-10-CM | POA: Diagnosis not present

## 2016-08-12 DIAGNOSIS — C541 Malignant neoplasm of endometrium: Secondary | ICD-10-CM | POA: Insufficient documentation

## 2016-08-12 DIAGNOSIS — Z7984 Long term (current) use of oral hypoglycemic drugs: Secondary | ICD-10-CM | POA: Diagnosis not present

## 2016-08-12 NOTE — Patient Instructions (Addendum)
Plan to change the dressing (packing) twice daily until we see you next week on the 2nd.  Please call if you feel the incision is not improving and we can see you sooner.  Wound Packing  Wound packing involves placing a moistened packing material into your wound and then covering it with an outer bandage (dressing). This helps promote proper healing of deep tissue and tissue under the skin. It also helps prevent bleeding, infection, and further injury.  Wounds are packed until deep tissue has had time to heal. The time it takes for this to occur is different for everyone. Your health care provider will show you how to pack and dress your wound. Using gloves and a sterile technique is important in order to avoid spreading germs into your wound. RISKS AND COMPLICATIONS  Infection.  Delayed or abnormal healing. HOW TO PACK YOUR WOUND Follow your health care provider's instructions on how often you need to change dressings and pack your wound. You will likely be asked to change dressings 1-2 times a day. Supplies Needed  Gloves.  Wetting solution.  Clean bowl.  Packing material (gauze or gauze sponges).  Clean towels.  Outer dressing.  Tape.  Cotton balls or cotton-tipped swabs.  Small plastic bag. Preparing Your New Packing Material 1. Make sure your work surface or countertop is clean and disinfected. 2. Wash your hands well with soap and water. 3. Put a clean towel on the counter. 4. Put a clean bowl on the towel. Be sure to only touch the outside of the bowl when handling it. 5. Pour wetting solution into the bowl. 6. Cut your packing material (gauze or sponges) to the right size for your wound. Drop it into the bowl. 7. Cut four tape strips that you will use to seal the outer dressing. 8. Put cotton balls or cotton-tipped swabs on the clean towel. Removing the Old Packing and Dressing 1. Gently remove the old dressing and packing material. 2. Put the removed items into the  plastic bag to throw away later. 3. Wash your hands well with soap and water again. Applying New Packing Material and Dressing 1. Put on your gloves. 2. Squeeze the packing material in the bowl to release excess liquid. The packing material should be moist, but not dripping wet. 3. Gently place the packing material into the wound. Use a cotton ball or cotton-tipped swab to guide it into place, filling all of the space. 4. Dry your fingertips on the towel. 5. Open up your outer dressing supplies and put them on a dry part of the towel. Keep them from getting wet. 6. Place the dressing over the packed wound. 7. Tape the four outer edges of the dressing in place. 8. Remove your gloves. 9. Wash your hands again with soap and water. General Tips  Follow your health care provider's instructions on how tightly to pack the wound. At first, the wound will be packed tightly to help stop bleeding. As the wound begins to heal inside, you will use less packing material and pack the wound loosely to allow tissue to heal slowly from the inside out.  Keep the dressing clean and dry.  Follow any other instructions given by your health care provider on how to aid healing. This may include applying warm or cold compresses, elevating the affected area, or wearing a compression dressing.  Ask your health care provider about sun exposure and sunscreen when the dressings are no longer needed.  Keep all follow-up visits  with your health care provider. This is important. SEEK MEDICAL CARE IF:  You have drainage, redness, swelling, or pain at your wound site.  You notice a bad smell coming from the wound site.  Your pain is not controlled with pain medicine.  Tissue inside your wound changes color from pink to white, yellow, or black.  Your wound changes in size or depth.  You have a fever.  You have shaking chills.  You are having trouble packing your wound.   This information is not intended to  replace advice given to you by your health care provider. Make sure you discuss any questions you have with your health care provider.   Document Released: 05/02/2014 Document Reviewed: 05/02/2014 Elsevier Interactive Patient Education [employer].

## 2016-08-14 ENCOUNTER — Telehealth: Payer: Self-pay | Admitting: Gynecologic Oncology

## 2016-08-14 NOTE — Telephone Encounter (Signed)
Ashlyn with [facility] called stating the patient has a physical therapist coming for therapy to her home and is asking for an RN to assist with dressing changes since she is having a difficult time attempting to pack the small areas on her incision.    Returned call to [employer] and left a message advising her that it would be fine for the patient to have a [facility] RN for the dressing changes.  Advised her to please call the office if specific orders/instructions were needed.

## 2016-08-20 ENCOUNTER — Encounter: Payer: Self-pay | Admitting: Gynecologic Oncology

## 2016-08-20 ENCOUNTER — Ambulatory Visit: Payer: 59 | Attending: Gynecologic Oncology | Admitting: Gynecologic Oncology

## 2016-08-20 VITALS — BP 134/73 | HR 81 | Temp 98.6°F | Resp 18 | Wt 207.0 lb

## 2016-08-20 DIAGNOSIS — Z79899 Other long term (current) drug therapy: Secondary | ICD-10-CM | POA: Diagnosis not present

## 2016-08-20 DIAGNOSIS — Z90710 Acquired absence of both cervix and uterus: Secondary | ICD-10-CM | POA: Diagnosis not present

## 2016-08-20 DIAGNOSIS — E119 Type 2 diabetes mellitus without complications: Secondary | ICD-10-CM | POA: Diagnosis not present

## 2016-08-20 DIAGNOSIS — Z7982 Long term (current) use of aspirin: Secondary | ICD-10-CM | POA: Diagnosis not present

## 2016-08-20 DIAGNOSIS — Z9889 Other specified postprocedural states: Secondary | ICD-10-CM | POA: Insufficient documentation

## 2016-08-20 DIAGNOSIS — Z7984 Long term (current) use of oral hypoglycemic drugs: Secondary | ICD-10-CM | POA: Insufficient documentation

## 2016-08-20 DIAGNOSIS — I10 Essential (primary) hypertension: Secondary | ICD-10-CM | POA: Insufficient documentation

## 2016-08-20 DIAGNOSIS — G4733 Obstructive sleep apnea (adult) (pediatric): Secondary | ICD-10-CM | POA: Diagnosis not present

## 2016-08-20 DIAGNOSIS — C541 Malignant neoplasm of endometrium: Secondary | ICD-10-CM | POA: Diagnosis not present

## 2016-08-20 DIAGNOSIS — K632 Fistula of intestine: Secondary | ICD-10-CM

## 2016-08-20 NOTE — Patient Instructions (Signed)
Your incision looks great.  Continue with packing and with your protein intake.  Please call if you feel the incision is not healing or if it is getting bigger.  Please call our office in one week to update [place] on your progress.
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
## 2016-08-24 ENCOUNTER — Encounter (INDEPENDENT_AMBULATORY_CARE_PROVIDER_SITE_OTHER): Payer: Self-pay | Admitting: Orthopedic Surgery

## 2016-08-24 ENCOUNTER — Ambulatory Visit (INDEPENDENT_AMBULATORY_CARE_PROVIDER_SITE_OTHER): Payer: 59 | Admitting: Orthopedic Surgery

## 2016-08-24 VITALS — Ht 60.0 in | Wt 207.0 lb

## 2016-08-24 DIAGNOSIS — M76821 Posterior tibial tendinitis, right leg: Secondary | ICD-10-CM | POA: Diagnosis not present
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
## 2016-08-25 ENCOUNTER — Telehealth: Payer: Self-pay | Admitting: Gynecologic Oncology

## 2016-08-25 ENCOUNTER — Other Ambulatory Visit: Payer: Self-pay | Admitting: *Deleted

## 2016-08-25 ENCOUNTER — Telehealth: Payer: Self-pay

## 2016-08-25 MED ORDER — ONETOUCH ULTRASOFT LANCETS MISC: 1.0000 | Freq: Two times a day (BID) | 0 refills | Status: DC

## 2016-08-25 MED ORDER — METFORMIN HCL ER 500 MG PO TB24: 500.0000 mg | ORAL_TABLET | Freq: Every day | ORAL | 0 refills | Status: DC

## 2016-08-25 MED ORDER — GLUCOSE BLOOD VI STRP: 1.0000 | ORAL_STRIP | Freq: Two times a day (BID) | 0 refills | Status: DC

## 2016-08-25 NOTE — Telephone Encounter (Signed)
Attempted to contact patient about incisional concerns.  Left message for her asking her to please call the office.  We have received a message earlier from Talmage Nap, RN with [facility] stating the patient reported pus on her packing.  No redness or increased warmth.  RN also stating they are having difficulty getting packing into the hole.  Attempted to call the RN back at the number [phone] taken from the secretary but wrong number.  Will wait to hear from patient.

## 2016-08-25 NOTE — Telephone Encounter (Signed)
Rec'd call pt states she is schedule for appt 11/17, but she is needing rx for testing strips & lancets until appt. Verified pharmacy sent to [facility].../lmb

## 2016-08-25 NOTE — Telephone Encounter (Signed)
Orders received from [facility] to contact the patient to see if would like to come in tomorrow at 1 PM to see Dr [name] for assessment of her wound. Patient contacted , patient states she saw "pus" yesterday ,but did not seen any today . Patient agreed to the paln ,denies further questions at this time. Melissa Cross, APNP updated.

## 2016-08-26 ENCOUNTER — Ambulatory Visit: Payer: 59 | Attending: Gynecologic Oncology | Admitting: Gynecologic Oncology

## 2016-08-26 ENCOUNTER — Encounter: Payer: Self-pay | Admitting: Gynecologic Oncology

## 2016-08-26 VITALS — BP 141/80 | HR 72 | Temp 97.6°F | Resp 18 | Ht 60.0 in | Wt 211.7 lb

## 2016-08-26 DIAGNOSIS — C541 Malignant neoplasm of endometrium: Secondary | ICD-10-CM | POA: Diagnosis not present

## 2016-08-26 DIAGNOSIS — T8130XS Disruption of wound, unspecified, sequela: Secondary | ICD-10-CM | POA: Diagnosis not present

## 2016-08-26 DIAGNOSIS — Z4801 Encounter for change or removal of surgical wound dressing: Secondary | ICD-10-CM | POA: Insufficient documentation

## 2016-08-26 DIAGNOSIS — Z9889 Other specified postprocedural states: Secondary | ICD-10-CM | POA: Diagnosis not present

## 2016-08-26 NOTE — Patient Instructions (Signed)
No more packing necessary.  You can keep a bandaid over the area or let it be open to air.  Please call us for any changes with the area including increased drainage, increased warmth.  Plan to follow up in six months from surgery or sooner if needed.
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
## 2016-09-04 ENCOUNTER — Ambulatory Visit (INDEPENDENT_AMBULATORY_CARE_PROVIDER_SITE_OTHER): Payer: 59 | Admitting: Internal Medicine

## 2016-09-04 ENCOUNTER — Other Ambulatory Visit (INDEPENDENT_AMBULATORY_CARE_PROVIDER_SITE_OTHER): Payer: 59

## 2016-09-04 VITALS — BP 124/82 | HR 72 | Temp 98.4°F | Resp 16 | Ht 60.0 in | Wt 210.0 lb

## 2016-09-04 DIAGNOSIS — D509 Iron deficiency anemia, unspecified: Secondary | ICD-10-CM | POA: Diagnosis not present

## 2016-09-04 DIAGNOSIS — E119 Type 2 diabetes mellitus without complications: Secondary | ICD-10-CM | POA: Diagnosis not present

## 2016-09-04 DIAGNOSIS — R5383 Other fatigue: Secondary | ICD-10-CM | POA: Diagnosis not present

## 2016-09-04 DIAGNOSIS — E785 Hyperlipidemia, unspecified: Secondary | ICD-10-CM

## 2016-09-04 DIAGNOSIS — Z23 Encounter for immunization: Secondary | ICD-10-CM

## 2016-09-04 DIAGNOSIS — Z0001 Encounter for general adult medical examination with abnormal findings: Secondary | ICD-10-CM

## 2016-09-04 LAB — LIPID PANEL
Cholesterol: 191 mg/dL (ref 0–200)
HDL: 64.2 mg/dL (ref >39.00)
LDL Cholesterol: 111 mg/dL — ABNORMAL HIGH (ref 0–99)
NonHDL: 127.07
Total CHOL/HDL Ratio: 3
Triglycerides: 82 mg/dL (ref 0.0–149.0)
VLDL: 16.4 mg/dL (ref 0.0–40.0)

## 2016-09-04 LAB — BASIC METABOLIC PANEL
BUN: 20 mg/dL (ref 6–23)
CO2: 27 mEq/L (ref 19–32)
Calcium: 9.8 mg/dL (ref 8.4–10.5)
Chloride: 103 mEq/L (ref 96–112)
Creatinine, Ser: 0.62 mg/dL (ref 0.40–1.20)
GFR: 127.17 mL/min (ref >60.00)
Glucose, Bld: 94 mg/dL (ref 70–99)
Potassium: 3.9 mEq/L (ref 3.5–5.1)
Sodium: 137 mEq/L (ref 135–145)

## 2016-09-04 LAB — CBC WITH DIFFERENTIAL/PLATELET
Basophils Absolute: 0 10*3/uL (ref 0.0–0.1)
Basophils Relative: 0.4 % (ref 0.0–3.0)
Eosinophils Absolute: 0.1 10*3/uL (ref 0.0–0.7)
Eosinophils Relative: 1.5 % (ref 0.0–5.0)
HCT: 33.2 % — ABNORMAL LOW (ref 36.0–46.0)
Hemoglobin: 10.8 g/dL — ABNORMAL LOW (ref 12.0–15.0)
Lymphocytes Relative: 27.1 % (ref 12.0–46.0)
Lymphs Abs: 1.6 10*3/uL (ref 0.7–4.0)
MCHC: 32.6 g/dL (ref 30.0–36.0)
MCV: 83.5 fl (ref 78.0–100.0)
Monocytes Absolute: 0.7 10*3/uL (ref 0.1–1.0)
Monocytes Relative: 11.2 % (ref 3.0–12.0)
Neutro Abs: 3.5 10*3/uL (ref 1.4–7.7)
Neutrophils Relative %: 59.8 % (ref 43.0–77.0)
Platelets: 351 10*3/uL (ref 150.0–400.0)
RBC: 3.97 Mil/uL (ref 3.87–5.11)
RDW: 16.1 % — ABNORMAL HIGH (ref 11.5–15.5)
WBC: 5.8 10*3/uL (ref 4.0–10.5)

## 2016-09-04 LAB — IBC PANEL
Iron: 34 ug/dL — ABNORMAL LOW (ref 42–145)
Saturation Ratios: 11 % — ABNORMAL LOW (ref 20.0–50.0)
Transferrin: 221 mg/dL (ref 212.0–360.0)

## 2016-09-04 LAB — T4, FREE: Free T4: 0.86 ng/dL (ref 0.60–1.60)

## 2016-09-04 LAB — HEPATIC FUNCTION PANEL
ALT: 9 U/L (ref 0–35)
AST: 12 U/L (ref 0–37)
Albumin: 3.9 g/dL (ref 3.5–5.2)
Alkaline Phosphatase: 52 U/L (ref 39–117)
Bilirubin, Direct: 0 mg/dL (ref 0.0–0.3)
Total Bilirubin: 0.3 mg/dL (ref 0.2–1.2)
Total Protein: 8.6 g/dL — ABNORMAL HIGH (ref 6.0–8.3)

## 2016-09-04 LAB — HEMOGLOBIN A1C: Hgb A1c MFr Bld: 5.7 % (ref 4.6–6.5)

## 2016-09-04 LAB — TSH: TSH: 1.51 u[IU]/mL (ref 0.35–4.50)

## 2016-09-04 NOTE — Patient Instructions (Addendum)

## 2016-09-04 NOTE — Progress Notes (Signed)
Pre visit review using our clinic review tool, if applicable. No additional management support is needed unless otherwise documented below in the visit note. 
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
## 2016-09-05 NOTE — Assessment & Plan Note (Addendum)
Lab Results  Component Value Date   LDLCALC 111 (H) 09/04/2016   Goal < 70, declines statin for now, o/w stable overall by history and exam, recent data reviewed with pt, and pt to continue medical treatment as before,  to f/u any worsening symptoms or concerns

## 2016-09-05 NOTE — Assessment & Plan Note (Signed)
Lab Results  Component Value Date   WBC 5.8 09/04/2016   HGB 10.8 (L) 09/04/2016   HCT 33.2 (L) 09/04/2016   MCV 83.5 09/04/2016   PLT 351.0 09/04/2016   stable overall by history and exam, recent data reviewed with pt, and pt to continue medical treatment as before,  to f/u any worsening symptoms or concerns, also check iron level

## 2016-09-05 NOTE — Assessment & Plan Note (Signed)
Etiology unclear, Exam otherwise benign, to check labs as documented, follow with expectant management - TFT's

## 2016-09-05 NOTE — Assessment & Plan Note (Signed)
stable overall by history and exam, recent data reviewed with pt, and pt to continue medical treatment as before,  to f/u any worsening symptoms or concerns Lab Results  Component Value Date   HGBA1C 5.7 09/04/2016

## 2016-09-08 ENCOUNTER — Telehealth: Payer: Self-pay | Admitting: Gynecologic Oncology

## 2016-09-08 ENCOUNTER — Telehealth: Payer: Self-pay | Admitting: *Deleted

## 2016-09-08 NOTE — Telephone Encounter (Signed)
Returned call to patient.  Patient had called about her [employer] short term disability forms.  Advised patient that the form had been sent to be scanned and to either have [employer] resend the form or give [place] their contact info so we can request it.  Advised to please call the office.

## 2016-09-08 NOTE — Telephone Encounter (Signed)
Pt called with concerns regarding Disability paperwork. Pt last office visit on 08/26/16 with Dr. [name]. Per progress note 11/8: Her wound appears well healed today without evidence of infection. She will follow up as scheduled for endometrial cancer surveillance and call the office if any further concerns. Called pt, unable to reach. LMOVM left message for pt to call office as there is no short term disability paperwork pending as pt was cleared at last office visit 11/8 per Dr. Carlis Abbott.

## 2016-09-09 ENCOUNTER — Telehealth: Payer: Self-pay | Admitting: *Deleted

## 2016-09-09 NOTE — Telephone Encounter (Signed)
Paperwork for [employer] faxed to [phone]

## 2016-09-22 ENCOUNTER — Other Ambulatory Visit: Payer: Self-pay | Admitting: Internal Medicine

## 2016-10-28 ENCOUNTER — Ambulatory Visit (INDEPENDENT_AMBULATORY_CARE_PROVIDER_SITE_OTHER): Payer: 59 | Admitting: Orthopedic Surgery

## 2016-10-30 ENCOUNTER — Ambulatory Visit (INDEPENDENT_AMBULATORY_CARE_PROVIDER_SITE_OTHER): Payer: 59 | Admitting: Orthopedic Surgery

## 2016-10-30 VITALS — Ht 60.0 in | Wt 210.0 lb

## 2016-10-30 DIAGNOSIS — M7671 Peroneal tendinitis, right leg: Secondary | ICD-10-CM
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
## 2016-11-20 ENCOUNTER — Encounter: Payer: Self-pay | Admitting: Gynecologic Oncology

## 2016-11-20 ENCOUNTER — Ambulatory Visit: Payer: 59 | Attending: Gynecologic Oncology | Admitting: Gynecologic Oncology

## 2016-11-20 VITALS — BP 142/99 | HR 63 | Temp 98.2°F | Resp 18 | Ht 60.0 in | Wt 221.6 lb

## 2016-11-20 DIAGNOSIS — Z8542 Personal history of malignant neoplasm of other parts of uterus: Secondary | ICD-10-CM | POA: Diagnosis not present

## 2016-11-20 DIAGNOSIS — C541 Malignant neoplasm of endometrium: Secondary | ICD-10-CM | POA: Diagnosis not present

## 2016-11-20 DIAGNOSIS — Z9889 Other specified postprocedural states: Secondary | ICD-10-CM | POA: Diagnosis not present

## 2016-11-20 DIAGNOSIS — Z803 Family history of malignant neoplasm of breast: Secondary | ICD-10-CM | POA: Insufficient documentation

## 2016-11-20 DIAGNOSIS — Z7984 Long term (current) use of oral hypoglycemic drugs: Secondary | ICD-10-CM | POA: Diagnosis not present

## 2016-11-20 DIAGNOSIS — Z853 Personal history of malignant neoplasm of breast: Secondary | ICD-10-CM | POA: Diagnosis not present

## 2016-11-20 DIAGNOSIS — E119 Type 2 diabetes mellitus without complications: Secondary | ICD-10-CM | POA: Insufficient documentation

## 2016-11-20 DIAGNOSIS — Z90710 Acquired absence of both cervix and uterus: Secondary | ICD-10-CM | POA: Diagnosis not present

## 2016-11-20 DIAGNOSIS — Z7982 Long term (current) use of aspirin: Secondary | ICD-10-CM | POA: Insufficient documentation

## 2016-11-20 DIAGNOSIS — Z5189 Encounter for other specified aftercare: Secondary | ICD-10-CM | POA: Insufficient documentation

## 2016-11-20 NOTE — Patient Instructions (Signed)
Follow up with Dr Leo Grosser once a year and Dr Denman George once a year. The plan will be for you to be evaluated every 6 months. Next appointment should be with Dr Leo Grosser June/July 2018 and Dr Denman George January 2019

## 2016-11-30 ENCOUNTER — Ambulatory Visit (INDEPENDENT_AMBULATORY_CARE_PROVIDER_SITE_OTHER): Payer: 59 | Admitting: Orthopedic Surgery

## 2016-11-30 DIAGNOSIS — E1142 Type 2 diabetes mellitus with diabetic polyneuropathy: Secondary | ICD-10-CM | POA: Diagnosis not present

## 2016-11-30 DIAGNOSIS — M7671 Peroneal tendinitis, right leg: Secondary | ICD-10-CM

## 2016-11-30 DIAGNOSIS — M6701 Short Achilles tendon (acquired), right ankle: Secondary | ICD-10-CM
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
## 2016-12-28 ENCOUNTER — Encounter (INDEPENDENT_AMBULATORY_CARE_PROVIDER_SITE_OTHER): Payer: Self-pay | Admitting: Orthopedic Surgery

## 2016-12-28 ENCOUNTER — Ambulatory Visit (INDEPENDENT_AMBULATORY_CARE_PROVIDER_SITE_OTHER): Payer: 59 | Admitting: Orthopedic Surgery

## 2016-12-28 VITALS — Ht 61.0 in | Wt 221.0 lb

## 2016-12-28 DIAGNOSIS — M7671 Peroneal tendinitis, right leg: Secondary | ICD-10-CM | POA: Diagnosis not present

## 2016-12-28 DIAGNOSIS — M6701 Short Achilles tendon (acquired), right ankle: Secondary | ICD-10-CM | POA: Diagnosis not present

## 2016-12-28 DIAGNOSIS — M76821 Posterior tibial tendinitis, right leg: Secondary | ICD-10-CM | POA: Insufficient documentation

## 2016-12-28 DIAGNOSIS — E1142 Type 2 diabetes mellitus with diabetic polyneuropathy: Secondary | ICD-10-CM
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
## 2016-12-31 ENCOUNTER — Telehealth (INDEPENDENT_AMBULATORY_CARE_PROVIDER_SITE_OTHER): Payer: Self-pay | Admitting: Orthopedic Surgery

## 2016-12-31 NOTE — Telephone Encounter (Signed)
Records faxed to [facility] 02-04-2016 to present [phone]

## 2017-01-01 ENCOUNTER — Telehealth (INDEPENDENT_AMBULATORY_CARE_PROVIDER_SITE_OTHER): Payer: Self-pay | Admitting: Orthopedic Surgery

## 2017-01-01 NOTE — Telephone Encounter (Signed)
Called patient advised information is ready for pick up at the front desk. [phone]

## 2017-01-01 NOTE — Telephone Encounter (Signed)
Called patient to advised copy of records as she requested, are ready to pick up.

## 2017-01-25 ENCOUNTER — Ambulatory Visit (INDEPENDENT_AMBULATORY_CARE_PROVIDER_SITE_OTHER): Payer: 59 | Admitting: Orthopedic Surgery

## 2017-01-25 DIAGNOSIS — M7671 Peroneal tendinitis, right leg: Secondary | ICD-10-CM

## 2017-01-25 DIAGNOSIS — M76821 Posterior tibial tendinitis, right leg: Secondary | ICD-10-CM

## 2017-01-25 DIAGNOSIS — M6701 Short Achilles tendon (acquired), right ankle: Secondary | ICD-10-CM | POA: Diagnosis not present
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
## 2017-02-22 ENCOUNTER — Ambulatory Visit (INDEPENDENT_AMBULATORY_CARE_PROVIDER_SITE_OTHER): Payer: 59 | Admitting: Orthopedic Surgery

## 2017-02-22 DIAGNOSIS — M76821 Posterior tibial tendinitis, right leg: Secondary | ICD-10-CM

## 2017-02-22 DIAGNOSIS — M7671 Peroneal tendinitis, right leg: Secondary | ICD-10-CM
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
## 2017-03-04 ENCOUNTER — Ambulatory Visit: Payer: 59 | Admitting: Internal Medicine

## 2017-03-07 ENCOUNTER — Other Ambulatory Visit: Payer: Self-pay | Admitting: Internal Medicine

## 2017-03-11 ENCOUNTER — Ambulatory Visit: Payer: 59 | Admitting: Internal Medicine

## 2017-03-16 ENCOUNTER — Encounter: Payer: Self-pay | Admitting: Internal Medicine

## 2017-03-16 ENCOUNTER — Other Ambulatory Visit (INDEPENDENT_AMBULATORY_CARE_PROVIDER_SITE_OTHER): Payer: 59

## 2017-03-16 ENCOUNTER — Ambulatory Visit (INDEPENDENT_AMBULATORY_CARE_PROVIDER_SITE_OTHER): Payer: 59 | Admitting: Internal Medicine

## 2017-03-16 VITALS — BP 132/86 | HR 61 | Ht <= 58 in | Wt 228.0 lb

## 2017-03-16 DIAGNOSIS — Z0000 Encounter for general adult medical examination without abnormal findings: Secondary | ICD-10-CM | POA: Diagnosis not present

## 2017-03-16 DIAGNOSIS — D509 Iron deficiency anemia, unspecified: Secondary | ICD-10-CM | POA: Diagnosis not present

## 2017-03-16 DIAGNOSIS — H6121 Impacted cerumen, right ear: Secondary | ICD-10-CM

## 2017-03-16 DIAGNOSIS — H9191 Unspecified hearing loss, right ear: Secondary | ICD-10-CM | POA: Diagnosis not present

## 2017-03-16 DIAGNOSIS — E119 Type 2 diabetes mellitus without complications: Secondary | ICD-10-CM | POA: Diagnosis not present

## 2017-03-16 DIAGNOSIS — Z0001 Encounter for general adult medical examination with abnormal findings: Secondary | ICD-10-CM

## 2017-03-16 LAB — LIPID PANEL
Cholesterol: 171 mg/dL (ref 0–200)
HDL: 65.5 mg/dL (ref >39.00)
LDL Cholesterol: 88 mg/dL (ref 0–99)
NonHDL: 105.92
Total CHOL/HDL Ratio: 3
Triglycerides: 92 mg/dL (ref 0.0–149.0)
VLDL: 18.4 mg/dL (ref 0.0–40.0)

## 2017-03-16 LAB — CBC WITH DIFFERENTIAL/PLATELET
Basophils Absolute: 0 10*3/uL (ref 0.0–0.1)
Basophils Relative: 0.7 % (ref 0.0–3.0)
Eosinophils Absolute: 0.1 10*3/uL (ref 0.0–0.7)
Eosinophils Relative: 1.9 % (ref 0.0–5.0)
HCT: 38.3 % (ref 36.0–46.0)
Hemoglobin: 12.3 g/dL (ref 12.0–15.0)
Lymphocytes Relative: 30.9 % (ref 12.0–46.0)
Lymphs Abs: 1.5 10*3/uL (ref 0.7–4.0)
MCHC: 32.1 g/dL (ref 30.0–36.0)
MCV: 84.5 fl (ref 78.0–100.0)
Monocytes Absolute: 0.6 10*3/uL (ref 0.1–1.0)
Monocytes Relative: 12.3 % — ABNORMAL HIGH (ref 3.0–12.0)
Neutro Abs: 2.7 10*3/uL (ref 1.4–7.7)
Neutrophils Relative %: 54.2 % (ref 43.0–77.0)
Platelets: 267 10*3/uL (ref 150.0–400.0)
RBC: 4.53 Mil/uL (ref 3.87–5.11)
RDW: 15.4 % (ref 11.5–15.5)
WBC: 5 10*3/uL (ref 4.0–10.5)

## 2017-03-16 LAB — MICROALBUMIN / CREATININE URINE RATIO
Creatinine,U: 141.3 mg/dL
Microalb Creat Ratio: 1.4 mg/g (ref 0.0–30.0)
Microalb, Ur: 2 mg/dL — ABNORMAL HIGH (ref 0.0–1.9)

## 2017-03-16 LAB — HEPATIC FUNCTION PANEL
ALT: 13 U/L (ref 0–35)
AST: 14 U/L (ref 0–37)
Albumin: 3.9 g/dL (ref 3.5–5.2)
Alkaline Phosphatase: 47 U/L (ref 39–117)
Bilirubin, Direct: 0.1 mg/dL (ref 0.0–0.3)
Total Bilirubin: 0.2 mg/dL (ref 0.2–1.2)
Total Protein: 8 g/dL (ref 6.0–8.3)

## 2017-03-16 LAB — URINALYSIS, ROUTINE W REFLEX MICROSCOPIC
Bilirubin Urine: NEGATIVE
Ketones, ur: NEGATIVE
Leukocytes, UA: NEGATIVE
Nitrite: NEGATIVE
Specific Gravity, Urine: 1.025 (ref 1.000–1.030)
Total Protein, Urine: NEGATIVE
Urine Glucose: NEGATIVE
Urobilinogen, UA: 0.2 (ref 0.0–1.0)
pH: 6 (ref 5.0–8.0)

## 2017-03-16 LAB — BASIC METABOLIC PANEL
BUN: 11 mg/dL (ref 6–23)
CO2: 25 mEq/L (ref 19–32)
Calcium: 9.4 mg/dL (ref 8.4–10.5)
Chloride: 107 mEq/L (ref 96–112)
Creatinine, Ser: 0.6 mg/dL (ref 0.40–1.20)
GFR: 131.83 mL/min (ref >60.00)
Glucose, Bld: 79 mg/dL (ref 70–99)
Potassium: 3.9 mEq/L (ref 3.5–5.1)
Sodium: 140 mEq/L (ref 135–145)

## 2017-03-16 LAB — HEMOGLOBIN A1C: Hgb A1c MFr Bld: 6.4 % (ref 4.6–6.5)

## 2017-03-16 LAB — IBC PANEL
Iron: 57 ug/dL (ref 42–145)
Saturation Ratios: 17.9 % — ABNORMAL LOW (ref 20.0–50.0)
Transferrin: 228 mg/dL (ref 212.0–360.0)

## 2017-03-16 LAB — TSH: TSH: 2.31 u[IU]/mL (ref 0.35–4.50)

## 2017-03-16 MED ORDER — METFORMIN HCL ER 500 MG PO TB24: 500.0000 mg | ORAL_TABLET | Freq: Every day | ORAL | 3 refills | Status: DC

## 2017-03-16 MED ORDER — TELMISARTAN-HCTZ 80-12.5 MG PO TABS: 1.0000 | ORAL_TABLET | Freq: Every day | ORAL | 3 refills | Status: DC

## 2017-03-16 NOTE — Patient Instructions (Addendum)
Your EKG was OK today  Please consider the Prevnar 13 pneumonia shot  You will be contacted regarding the referral for: Endocrinology   Please continue all other medications as before, and refills have been done if requested.  Please have the pharmacy call with any other refills you may need.  Please continue your efforts at being more active, low cholesterol diet, and weight control.  You are otherwise up to date with prevention measures today.  Please keep your appointments with your specialists as you may have planned  Please go to the LAB in the Basement (turn left off the elevator) for the tests to be done today  You will be contacted by phone if any changes need to be made immediately.  Otherwise, you will receive a letter about your results with an explanation, but please check with MyChart first.  Please remember to sign up for MyChart if you have not done so, as this will be important to you in the future with finding out test results, communicating by private email, and scheduling acute appointments online when needed.  Please return in 6 months, or sooner if needed, with Lab testing done 3-5 days before

## 2017-03-16 NOTE — Assessment & Plan Note (Signed)
Admits to not taking iron as recommended, for f/u iron panel today

## 2017-03-16 NOTE — Assessment & Plan Note (Signed)
?   Control, with recent wt gain, for a1c today, pt requests endo referral non [facility]

## 2017-03-16 NOTE — Assessment & Plan Note (Signed)
Improved with irriagation of wax impaction,  to f/u any worsening symptoms or concerns
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
## 2017-03-16 NOTE — Assessment & Plan Note (Signed)

## 2017-03-17 ENCOUNTER — Telehealth (INDEPENDENT_AMBULATORY_CARE_PROVIDER_SITE_OTHER): Payer: Self-pay | Admitting: Radiology

## 2017-03-17 NOTE — Telephone Encounter (Signed)
[name] patient the MRI scan will encompass all the tendons at the level of the ankle.

## 2017-03-17 NOTE — Telephone Encounter (Signed)
Patient is wanting MRI to encompass lower leg, she wants to encompass all the tendon for evaluation. She wants to know if MRI order needs an addendum. Please advise. Was suppose to get MRI of ankle for posterior tibial tendon. She has not scheduled yet because of this.

## 2017-03-17 NOTE — Telephone Encounter (Signed)
I called and left voicemail for patient to advise that no addendum is needed for MRI. Advised her to please call them back and schedule. Advised that after that she can call our office back to get follow up appointment to review MRI.

## 2017-03-18 ENCOUNTER — Other Ambulatory Visit: Payer: Self-pay | Admitting: Internal Medicine

## 2017-03-18 ENCOUNTER — Telehealth: Payer: Self-pay

## 2017-03-18 MED ORDER — ROSUVASTATIN CALCIUM 10 MG PO TABS: 10.0000 mg | ORAL_TABLET | Freq: Every day | ORAL | 3 refills | Status: DC

## 2017-03-18 NOTE — Telephone Encounter (Signed)
-----   Message from Biagio Borg, MD sent at 03/18/2017  1:02 PM EDT ----- Left message on MyChart, pt to cont same tx except  The test results show that your current treatment is OK, except the :LDL cholesterol is mildly elevated, with the goal being to be less than 70.  We should ask you to start a low dose statin in order to get this down, and reduce your future heart disease risk and stroke risk.  A new prescription will be sent, and you should be called from the office as well.    [name] to please inform pt, I will do rx

## 2017-03-18 NOTE — Telephone Encounter (Signed)
Pt has been informed and expressed understanding.  

## 2017-03-22 ENCOUNTER — Ambulatory Visit (INDEPENDENT_AMBULATORY_CARE_PROVIDER_SITE_OTHER): Payer: 59 | Admitting: Orthopedic Surgery

## 2017-03-22 ENCOUNTER — Telehealth (INDEPENDENT_AMBULATORY_CARE_PROVIDER_SITE_OTHER): Payer: Self-pay | Admitting: Orthopedic Surgery

## 2017-03-22 NOTE — Telephone Encounter (Signed)
Patient request to have her MRI order changed to add her leg as well as her ankle. She states she was told by [facility] that the order for the ankle would not include the leg unless its specified on order.

## 2017-03-23 DIAGNOSIS — Z853 Personal history of malignant neoplasm of breast: Secondary | ICD-10-CM | POA: Diagnosis not present

## 2017-03-23 LAB — HM MAMMOGRAPHY

## 2017-03-23 NOTE — Telephone Encounter (Signed)
This has been addressed see previous message.  

## 2017-03-23 NOTE — Telephone Encounter (Signed)
Called and lm on vm to advise that per Dr. Sharol Given the MRI of the ankle will show all the tendons and that she does not require one of her entire leg. I advised that if she has any questions to please call the office otherwise to please proceed with the MRI.

## 2017-03-23 NOTE — Telephone Encounter (Signed)
Do you want to add to her leg to the MRI ankle?

## 2017-03-26 ENCOUNTER — Telehealth: Payer: Self-pay | Admitting: *Deleted

## 2017-03-26 NOTE — Telephone Encounter (Signed)
Called patient to get her scheduled for her Endocrinology appointment. While on the phone she asked for the results of her EKG she had done in the office. Please advise.

## 2017-03-26 NOTE — Telephone Encounter (Signed)
Pt has been informed that ekg was good.
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
## 2017-03-30 ENCOUNTER — Other Ambulatory Visit (HOSPITAL_BASED_OUTPATIENT_CLINIC_OR_DEPARTMENT_OTHER): Payer: 59

## 2017-03-30 ENCOUNTER — Ambulatory Visit (HOSPITAL_BASED_OUTPATIENT_CLINIC_OR_DEPARTMENT_OTHER): Payer: 59 | Admitting: Adult Health

## 2017-03-30 ENCOUNTER — Encounter: Payer: 59 | Admitting: Nurse Practitioner

## 2017-03-30 VITALS — BP 150/77 | HR 63 | Temp 98.2°F | Resp 18 | Ht <= 58 in | Wt 230.6 lb

## 2017-03-30 DIAGNOSIS — D509 Iron deficiency anemia, unspecified: Secondary | ICD-10-CM | POA: Diagnosis not present

## 2017-03-30 DIAGNOSIS — C50112 Malignant neoplasm of central portion of left female breast: Secondary | ICD-10-CM

## 2017-03-30 DIAGNOSIS — Z8542 Personal history of malignant neoplasm of other parts of uterus: Secondary | ICD-10-CM

## 2017-03-30 DIAGNOSIS — Z170 Estrogen receptor positive status [ER+]: Secondary | ICD-10-CM

## 2017-03-30 DIAGNOSIS — Z853 Personal history of malignant neoplasm of breast: Secondary | ICD-10-CM | POA: Diagnosis not present

## 2017-03-30 LAB — COMPREHENSIVE METABOLIC PANEL
ALT: 15 U/L (ref 0–55)
AST: 17 U/L (ref 5–34)
Albumin: 3.4 g/dL — ABNORMAL LOW (ref 3.5–5.0)
Alkaline Phosphatase: 60 U/L (ref 40–150)
Anion Gap: 8 mEq/L (ref 3–11)
BUN: 9.7 mg/dL (ref 7.0–26.0)
CO2: 27 mEq/L (ref 22–29)
Calcium: 9.7 mg/dL (ref 8.4–10.4)
Chloride: 108 mEq/L (ref 98–109)
Creatinine: 0.8 mg/dL (ref 0.6–1.1)
EGFR: >90 mL/min/{1.73_m2} (ref >90)
Glucose: 91 mg/dl (ref 70–140)
Potassium: 3.8 mEq/L (ref 3.5–5.1)
Sodium: 143 mEq/L (ref 136–145)
Total Bilirubin: 0.34 mg/dL (ref 0.20–1.20)
Total Protein: 8 g/dL (ref 6.4–8.3)

## 2017-03-30 LAB — CBC WITH DIFFERENTIAL/PLATELET
BASO%: 0.7 % (ref 0.0–2.0)
Basophils Absolute: 0 10*3/uL (ref 0.0–0.1)
EOS%: 0.9 % (ref 0.0–7.0)
Eosinophils Absolute: 0 10*3/uL (ref 0.0–0.5)
HCT: 37.4 % (ref 34.8–46.6)
HGB: 11.9 g/dL (ref 11.6–15.9)
LYMPH%: 27.4 % (ref 14.0–49.7)
MCH: 27.1 pg (ref 25.1–34.0)
MCHC: 31.9 g/dL (ref 31.5–36.0)
MCV: 85 fL (ref 79.5–101.0)
MONO#: 0.5 10*3/uL (ref 0.1–0.9)
MONO%: 10.1 % (ref 0.0–14.0)
NEUT#: 2.9 10*3/uL (ref 1.5–6.5)
NEUT%: 60.9 % (ref 38.4–76.8)
Platelets: 238 10*3/uL (ref 145–400)
RBC: 4.39 10*6/uL (ref 3.70–5.45)
RDW: 15.1 % — ABNORMAL HIGH (ref 11.2–14.5)
WBC: 4.8 10*3/uL (ref 3.9–10.3)
lymph#: 1.3 10*3/uL (ref 0.9–3.3)

## 2017-03-30 NOTE — Pre-Procedure Instructions (Signed)
Mammo [phone]

## 2017-03-31 ENCOUNTER — Encounter: Payer: Self-pay | Admitting: Adult Health

## 2017-04-02 ENCOUNTER — Ambulatory Visit (INDEPENDENT_AMBULATORY_CARE_PROVIDER_SITE_OTHER): Payer: 59 | Admitting: Podiatry

## 2017-04-02 ENCOUNTER — Encounter: Payer: Self-pay | Admitting: Podiatry

## 2017-04-02 ENCOUNTER — Ambulatory Visit: Payer: 59 | Admitting: Podiatry

## 2017-04-02 DIAGNOSIS — L600 Ingrowing nail: Secondary | ICD-10-CM

## 2017-04-02 NOTE — Patient Instructions (Signed)

## 2017-04-05 ENCOUNTER — Ambulatory Visit
Admission: RE | Admit: 2017-04-05 | Discharge: 2017-04-05 | Disposition: A | Discharge status: Home / Self Care | Payer: 59 | Source: Ambulatory Visit | Attending: Orthopedic Surgery | Admitting: Orthopedic Surgery

## 2017-04-05 DIAGNOSIS — M7671 Peroneal tendinitis, right leg: Secondary | ICD-10-CM

## 2017-04-05 DIAGNOSIS — M76821 Posterior tibial tendinitis, right leg: Secondary | ICD-10-CM

## 2017-04-05 DIAGNOSIS — M19071 Primary osteoarthritis, right ankle and foot: Secondary | ICD-10-CM | POA: Diagnosis not present

## 2017-04-05 NOTE — Progress Notes (Signed)
Subjective:    Patient ID: [name], female   DOB: 61 y.o.   MRN: [id number]   HPI patient presents with severe damage to the hallux nails of both feet with thick incurvated changes and abnormal appearance with pain when pressed dorsally    Review of Systems  All other systems reviewed and are negative.       Objective:  Physical Exam  Cardiovascular: Intact distal pulses.   Pulmonary/Chest: Breath sounds normal.  Musculoskeletal: Normal range of motion.  Neurological: She is alert.  Skin: Skin is warm.  Nursing note and vitals reviewed.  neurovascular status intact muscle strength adequate range of motion within normal limits with patient noted to have severely damaged hallux nails bilateral that are painful when pressed and makes shoe gear difficult. Patient has tried to trim them and cannot do that anymore due to the damage present. Patient's found have good digital perfusion well oriented 3     Assessment:    Damaged hallux nails bilateral with dystrophic changes     Plan:    H&P conditions reviewed and recommended nail removal. Explained procedure and risk and at this point I infiltrated each hallux 60 mg Xylocaine Marcaine mixture removed the hallux nails exposed matrix and applied phenol 3 applications 30 seconds followed by alcohol lavage and sterile dressing. Gave instructions on soaks and reappoint

## 2017-04-12 ENCOUNTER — Encounter (INDEPENDENT_AMBULATORY_CARE_PROVIDER_SITE_OTHER): Payer: Self-pay | Admitting: Orthopedic Surgery

## 2017-04-12 ENCOUNTER — Ambulatory Visit (INDEPENDENT_AMBULATORY_CARE_PROVIDER_SITE_OTHER): Payer: 59 | Admitting: Orthopedic Surgery

## 2017-04-12 DIAGNOSIS — I87323 Chronic venous hypertension (idiopathic) with inflammation of bilateral lower extremity: Secondary | ICD-10-CM | POA: Diagnosis not present

## 2017-04-12 DIAGNOSIS — M6701 Short Achilles tendon (acquired), right ankle: Secondary | ICD-10-CM | POA: Insufficient documentation

## 2017-04-12 DIAGNOSIS — E1142 Type 2 diabetes mellitus with diabetic polyneuropathy: Secondary | ICD-10-CM
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
## 2017-04-15 ENCOUNTER — Ambulatory Visit (INDEPENDENT_AMBULATORY_CARE_PROVIDER_SITE_OTHER): Payer: 59 | Admitting: Podiatry

## 2017-04-15 DIAGNOSIS — L03031 Cellulitis of right toe: Secondary | ICD-10-CM | POA: Diagnosis not present

## 2017-04-15 MED ORDER — CEPHALEXIN 500 MG PO CAPS: 500.0000 mg | ORAL_CAPSULE | Freq: Two times a day (BID) | ORAL | 0 refills | Status: DC

## 2017-04-16 NOTE — Progress Notes (Signed)
Subjective:    Patient ID: [name], female   DOB: 61 y.o.   MRN: [id number]   HPI patient presents stating that she's getting some irritation in the proximal portion were her nails removed and with her diabetes she wanted to get this checked as she gets nervous about what may be going on    ROS      Objective:  Physical Exam neurovascular status intact negative Homans sign was noted with patient found to have irritation of the proximal nail fold bilateral with no active drainage noted or no proximal edema erythema drainage noted. Nailbeds themselves have healed well and are crusted over with no active drainage     Assessment:   Appears to be healing fine with possibility for very mild localized inflammation or reaction to chemical with no active drainage noted      Plan:   H&P and I educated her on condition. I do think that this is uneventful and I want her to continue soaks but we'll switch over to warm soapy water and as precautionary measure I placed her on cephalexin 500 mg twice a day and I gave her strict instructions of any increase in discoloration should occur or any proximal edema erythema or drainage she is to reappoint immediately. Should heal very uneventfully with this patient

## 2017-05-12 DIAGNOSIS — E11319 Type 2 diabetes mellitus with unspecified diabetic retinopathy without macular edema: Secondary | ICD-10-CM | POA: Diagnosis not present

## 2017-05-12 LAB — HM DIABETES EYE EXAM

## 2017-06-29 DIAGNOSIS — M21611 Bunion of right foot: Secondary | ICD-10-CM | POA: Insufficient documentation

## 2017-06-29 DIAGNOSIS — E119 Type 2 diabetes mellitus without complications: Secondary | ICD-10-CM | POA: Insufficient documentation

## 2017-06-29 DIAGNOSIS — Z23 Encounter for immunization: Secondary | ICD-10-CM | POA: Diagnosis not present

## 2017-06-29 DIAGNOSIS — E1159 Type 2 diabetes mellitus with other circulatory complications: Secondary | ICD-10-CM | POA: Diagnosis not present

## 2017-08-06 ENCOUNTER — Emergency Department (HOSPITAL_COMMUNITY): Payer: 59

## 2017-08-06 ENCOUNTER — Emergency Department (HOSPITAL_COMMUNITY)
Admission: EM | Admit: 2017-08-06 | Discharge: 2017-08-06 | Disposition: A | Discharge status: Home / Self Care | Payer: 59 | Attending: Emergency Medicine | Admitting: Emergency Medicine

## 2017-08-06 ENCOUNTER — Encounter (HOSPITAL_COMMUNITY): Payer: Self-pay | Admitting: Emergency Medicine

## 2017-08-06 DIAGNOSIS — R109 Unspecified abdominal pain: Secondary | ICD-10-CM | POA: Diagnosis present

## 2017-08-06 DIAGNOSIS — Y999 Unspecified external cause status: Secondary | ICD-10-CM | POA: Diagnosis not present

## 2017-08-06 DIAGNOSIS — N3289 Other specified disorders of bladder: Secondary | ICD-10-CM | POA: Diagnosis not present

## 2017-08-06 DIAGNOSIS — Z79899 Other long term (current) drug therapy: Secondary | ICD-10-CM | POA: Insufficient documentation

## 2017-08-06 DIAGNOSIS — Y939 Activity, unspecified: Secondary | ICD-10-CM | POA: Diagnosis not present

## 2017-08-06 DIAGNOSIS — S39012A Strain of muscle, fascia and tendon of lower back, initial encounter: Secondary | ICD-10-CM | POA: Diagnosis not present

## 2017-08-06 DIAGNOSIS — X501XXA Overexertion from prolonged static or awkward postures, initial encounter: Secondary | ICD-10-CM | POA: Insufficient documentation

## 2017-08-06 DIAGNOSIS — Y929 Unspecified place or not applicable: Secondary | ICD-10-CM | POA: Diagnosis not present

## 2017-08-06 DIAGNOSIS — E1140 Type 2 diabetes mellitus with diabetic neuropathy, unspecified: Secondary | ICD-10-CM | POA: Insufficient documentation

## 2017-08-06 DIAGNOSIS — I10 Essential (primary) hypertension: Secondary | ICD-10-CM | POA: Insufficient documentation

## 2017-08-06 DIAGNOSIS — Z7984 Long term (current) use of oral hypoglycemic drugs: Secondary | ICD-10-CM | POA: Insufficient documentation

## 2017-08-06 DIAGNOSIS — Z96652 Presence of left artificial knee joint: Secondary | ICD-10-CM | POA: Diagnosis not present

## 2017-08-06 LAB — URINALYSIS, ROUTINE W REFLEX MICROSCOPIC
Bilirubin Urine: NEGATIVE
Glucose, UA: NEGATIVE mg/dL
Ketones, ur: NEGATIVE mg/dL
Leukocytes, UA: NEGATIVE
Nitrite: NEGATIVE
Protein, ur: NEGATIVE mg/dL
Specific Gravity, Urine: 1.02 (ref 1.005–1.030)
pH: 6 (ref 5.0–8.0)

## 2017-08-06 LAB — CBC
HCT: 35.8 % — ABNORMAL LOW (ref 36.0–46.0)
Hemoglobin: 11.4 g/dL — ABNORMAL LOW (ref 12.0–15.0)
MCH: 27.7 pg (ref 26.0–34.0)
MCHC: 31.8 g/dL (ref 30.0–36.0)
MCV: 86.9 fL (ref 78.0–100.0)
Platelets: 241 10*3/uL (ref 150–400)
RBC: 4.12 MIL/uL (ref 3.87–5.11)
RDW: 14.6 % (ref 11.5–15.5)
WBC: 5.7 10*3/uL (ref 4.0–10.5)

## 2017-08-06 LAB — COMPREHENSIVE METABOLIC PANEL
ALT: 17 U/L (ref 14–54)
AST: 20 U/L (ref 15–41)
Albumin: 3.8 g/dL (ref 3.5–5.0)
Alkaline Phosphatase: 56 U/L (ref 38–126)
Anion gap: 10 (ref 5–15)
BUN: 13 mg/dL (ref 6–20)
CO2: 26 mmol/L (ref 22–32)
Calcium: 9.2 mg/dL (ref 8.9–10.3)
Chloride: 105 mmol/L (ref 101–111)
Creatinine, Ser: 0.66 mg/dL (ref 0.44–1.00)
GFR calc Af Amer: >60 mL/min (ref >60)
GFR calc non Af Amer: >60 mL/min (ref >60)
Glucose, Bld: 85 mg/dL (ref 65–99)
Potassium: 3.5 mmol/L (ref 3.5–5.1)
Sodium: 141 mmol/L (ref 135–145)
Total Bilirubin: 0.7 mg/dL (ref 0.3–1.2)
Total Protein: 8.4 g/dL — ABNORMAL HIGH (ref 6.5–8.1)

## 2017-08-06 LAB — LIPASE, BLOOD: Lipase: 19 U/L (ref 11–51)

## 2017-08-06 MED ORDER — CYCLOBENZAPRINE HCL 10 MG PO TABS: 10.0000 mg | ORAL_TABLET | Freq: Every day | ORAL | 0 refills | Status: AC

## 2017-08-06 MED ORDER — KETOROLAC TROMETHAMINE 15 MG/ML IJ SOLN
15.0000 mg | Freq: Once | INTRAMUSCULAR | Status: AC
  Administered 2017-08-06: 15 mg via INTRAVENOUS
  Filled 2017-08-06: qty 1

## 2017-08-06 MED ORDER — NAPROXEN 375 MG PO TABS: 375.0000 mg | ORAL_TABLET | Freq: Two times a day (BID) | ORAL | 0 refills | Status: DC

## 2017-08-06 NOTE — ED Notes (Signed)
Attempted to stick patient for blood draw but was

## 2017-08-06 NOTE — ED Notes (Signed)
Attempted blood draw but was unsuccessful x2.

## 2017-08-06 NOTE — ED Notes (Signed)
Bed: WA02 Expected date:  Expected time:  Means of arrival:  Comments: 

## 2017-08-06 NOTE — ED Notes (Signed)
2 failed IV access attempted, pt might benefit from US guided access, planning on it, please excuse delay in blood work.
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
## 2017-08-06 NOTE — Discharge Instructions (Signed)
You may use over-the-counter Motrin (Ibuprofen), Acetaminophen (Tylenol), topical muscle creams such as SalonPas, Icy Hot, Bengay, etc. Please stretch, apply heat, and have massage therapy for additional assistance. ° °

## 2017-08-06 NOTE — ED Notes (Signed)
Bed: WA05 Expected date:  Expected time:  Means of arrival:  Comments: 

## 2017-08-06 NOTE — ED Notes (Signed)
This writer attempt once to collect labs unable to fill tube. PT will wait for IV start

## 2017-08-06 NOTE — ED Notes (Signed)
Bed: WA03 Expected date:  Expected time:  Means of arrival:  Comments: 

## 2017-08-06 NOTE — ED Triage Notes (Signed)
Patient reports that she started having lower left back pain that radiates around to LLQ that started this morning after standing back up from picking something off air vent on floor. Patient denies any falls, injuries, n/v/d or urinary problems.

## 2017-09-05 DIAGNOSIS — S86019A Strain of unspecified Achilles tendon, initial encounter: Secondary | ICD-10-CM | POA: Diagnosis not present

## 2017-09-05 DIAGNOSIS — R21 Rash and other nonspecific skin eruption: Secondary | ICD-10-CM | POA: Diagnosis not present

## 2017-09-16 ENCOUNTER — Ambulatory Visit: Payer: 59 | Admitting: Internal Medicine

## 2017-09-27 ENCOUNTER — Ambulatory Visit: Payer: 59 | Admitting: Internal Medicine

## 2017-09-30 ENCOUNTER — Ambulatory Visit: Payer: 59 | Admitting: Internal Medicine

## 2017-10-01 ENCOUNTER — Encounter: Payer: Self-pay | Admitting: Internal Medicine

## 2017-10-01 ENCOUNTER — Ambulatory Visit: Payer: 59 | Admitting: Internal Medicine

## 2017-10-01 VITALS — BP 142/88 | HR 78 | Temp 97.8°F | Ht <= 58 in | Wt 244.0 lb

## 2017-10-01 DIAGNOSIS — A6000 Herpesviral infection of urogenital system, unspecified: Secondary | ICD-10-CM | POA: Insufficient documentation

## 2017-10-01 DIAGNOSIS — B001 Herpesviral vesicular dermatitis: Secondary | ICD-10-CM | POA: Diagnosis not present

## 2017-10-01 DIAGNOSIS — R609 Edema, unspecified: Secondary | ICD-10-CM

## 2017-10-01 DIAGNOSIS — I10 Essential (primary) hypertension: Secondary | ICD-10-CM

## 2017-10-01 DIAGNOSIS — E119 Type 2 diabetes mellitus without complications: Secondary | ICD-10-CM | POA: Diagnosis not present

## 2017-10-01 DIAGNOSIS — J069 Acute upper respiratory infection, unspecified: Secondary | ICD-10-CM | POA: Diagnosis not present

## 2017-10-01 HISTORY — DX: Essential (primary) hypertension: I10

## 2017-10-01 HISTORY — DX: Herpesviral infection of urogenital system, unspecified: A60.00

## 2017-10-01 LAB — POCT GLYCOSYLATED HEMOGLOBIN (HGB A1C): Hemoglobin A1C: 6.3

## 2017-10-01 MED ORDER — TELMISARTAN-HCTZ 80-12.5 MG PO TABS: 1.0000 | ORAL_TABLET | Freq: Every day | ORAL | 3 refills | Status: DC

## 2017-10-01 MED ORDER — VALACYCLOVIR HCL 500 MG PO TABS: 500.0000 mg | ORAL_TABLET | Freq: Two times a day (BID) | ORAL | 3 refills | Status: DC

## 2017-10-01 MED ORDER — HYDROCODONE-HOMATROPINE 5-1.5 MG/5ML PO SYRP: 5.0000 mL | ORAL_SOLUTION | Freq: Four times a day (QID) | ORAL | 0 refills | Status: DC | PRN

## 2017-10-01 MED ORDER — AZITHROMYCIN 250 MG PO TABS: ORAL_TABLET | ORAL | 1 refills | Status: DC
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
## 2017-10-01 NOTE — Patient Instructions (Addendum)
Your A1c was done today; be sure to follow up with your endocrinologist as planned  Please take all new medication as prescribed - the antibiotic, and cough medicine  Please continue all other medications as before, including restarting the micardis HCT for BP and swelling, as well as the valtrex suppressive therapy  Please stop the Allegra D, but ok to take Allegra  Please have the pharmacy call with any other refills you may need.  Please continue your efforts at being more active, low cholesterol diet, and weight control.  You are otherwise up to date with prevention measures today.  Please keep your appointments with your specialists as you may have planned  Please return in 6 months, or sooner if needed, with Lab testing done 3-5 days before

## 2017-10-03 NOTE — Assessment & Plan Note (Signed)
stable overall by history and exam, recent data reviewed with pt, and pt to continue medical treatment as before,  to f/u any worsening symptoms or concerns  

## 2017-10-03 NOTE — Assessment & Plan Note (Signed)
Mild uncontrolled, with recurrent trace bilat ankle edema, for restart micardis HCT,  to f/u any worsening symptoms or concerns

## 2017-10-03 NOTE — Assessment & Plan Note (Addendum)
Mild to mod, for antibx course,  to f/u any worsening symptoms or concerns  Note:  Total time for pt hx, exam, review of record with pt in the room, determination of diagnoses and plan for further eval and tx is > 40 min, with over 50% spent in coordination and counseling of patient including the differential dx, tx, further evaluation and other management of acute infection, DM, HTN with peripheral edema, recurrent cold sores and genital herpes

## 2017-10-03 NOTE — Assessment & Plan Note (Signed)
C/w venous insufficiency, for tx as above

## 2017-10-03 NOTE — Assessment & Plan Note (Signed)
For valtrex asd,  to f/u any worsening symptoms or concerns

## 2017-10-05 ENCOUNTER — Ambulatory Visit: Payer: Self-pay

## 2017-10-05 ENCOUNTER — Telehealth: Payer: Self-pay | Admitting: Internal Medicine

## 2017-10-05 MED ORDER — BENZONATATE 100 MG PO CAPS: ORAL_CAPSULE | ORAL | 0 refills | Status: DC

## 2017-10-05 NOTE — Telephone Encounter (Signed)
Ok for [employer]  Already has refill on her zpack

## 2017-10-05 NOTE — Telephone Encounter (Signed)
  Reason for Disposition . Cough  Answer Assessment - Initial Assessment Questions 1. ONSET: "When did the cough begin?"      Started  2 weeks ago 2. SEVERITY: "How bad is the cough today?"      Moderate 3. RESPIRATORY DISTRESS: "Describe your breathing."      No 4. FEVER: "Do you have a fever?" If so, ask: "What is your temperature, how was it measured, and when did it start?"     No - sweating at night 5. HEMOPTYSIS: "Are you coughing up any blood?" If so ask: "How much?" (flecks, streaks, tablespoons, etc.)     No 6. TREATMENT: "What have you done so far to treat the cough?" (e.g., meds, fluids, humidifier)     Antibiotic 7. CARDIAC HISTORY: "Do you have any history of heart disease?" (e.g., heart attack, congestive heart failure)      High cholesterol 8. LUNG HISTORY: "Do you have any history of lung disease?"  (e.g., pulmonary embolus, asthma, emphysema)     No 9. PE RISK FACTORS: "Do you have a history of blood clots?" (or: recent major surgery, recent prolonged travel, bedridden )     No 10. OTHER SYMPTOMS: "Do you have any other symptoms? (e.g., runny nose, wheezing, chest pain)       No  11. PREGNANCY: "Is there any chance you are pregnant?" "When was your last menstrual period?"       No 12. TRAVEL: "Have you traveled out of the country in the last month?" (e.g., travel history, exposures)       No  Protocols used: COUGH - ACUTE NON-PRODUCTIVE-A-AH Pt. Reports cough is "a little worse." Wants to know if she should continue antibiotic for a few days. She also reports she is allergic to Hydrocodone - requests a different cough medicine be sent in.

## 2017-10-08 ENCOUNTER — Encounter: Payer: Self-pay | Admitting: Family

## 2017-10-08 ENCOUNTER — Ambulatory Visit: Payer: 59 | Admitting: Family

## 2017-10-08 VITALS — BP 140/86 | HR 74 | Temp 98.4°F | Ht <= 58 in | Wt 245.4 lb

## 2017-10-08 DIAGNOSIS — J9801 Acute bronchospasm: Secondary | ICD-10-CM | POA: Diagnosis not present
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
## 2017-10-08 NOTE — Patient Instructions (Addendum)
Bronchospasm, Adult Bronchospasm is when airways in the lungs get smaller. When this happens, it can be hard to breathe. You may cough. You may also make a whistling sound when you breathe (wheeze). Follow these instructions at home: Medicines  Take over-the-counter and prescription medicines only as told by your doctor.  If you need to use an inhaler or nebulizer to take your medicine, ask your doctor how to use it.  If you were given a spacer, always use it with your inhaler. Lifestyle  Change your heating and air conditioning filter. Do this at least once a month.  Try not to use fireplaces and wood stoves.  Do not  smoke. Do not  allow smoking in your home.  Try not to use things that have a strong smell, like perfume.  Get rid of pests (such as roaches and mice) and their poop.  Remove any mold from your home.  Keep your house clean. Get rid of dust.  Use cleaning products that have no smell.  Replace carpet with wood, tile, or vinyl flooring.  Use allergy-proof pillows, mattress covers, and box spring covers.  Wash bed sheets and blankets every week. Use hot water. Dry them in a dryer.  Use blankets that are made of polyester or cotton.  Wash your hands often.  Keep pets out of your bedroom.  When you exercise, try not to breathe in cold air. General instructions  Have a plan for getting medical care. Know these things: ? When to call your doctor. ? When to call local emergency services (911 in the [place] ? Where to go in an emergency.  Stay up to date on your shots (immunizations).  When you have an episode: ? Stay calm. ? Relax. ? Breathe slowly. Contact a doctor if:  Your muscles ache.  Your chest hurts.  The color of the mucus you cough up (sputum) changes from clear or white to yellow, green, gray, or bloody.  The mucus you cough up gets thicker.  You have a fever. Get help right away if:  The whistling sound gets worse, even after you  take your medicines.  Your coughing gets worse.  You find it even harder to breathe.  Your chest hurts very much. Summary  Bronchospasm is when airways in the lungs get smaller.  When this happens, it can be hard to breathe. You may cough. You may also make a whistling sound when you breathe.  Stay away from things that cause you to have episodes. These include smoke or dust. This information is not intended to replace advice given to you by your health care provider. Make sure you discuss any questions you have with your health care provider. Document Released: 08/02/2009 Document Revised: 10/08/2016 Document Reviewed: 10/08/2016 Elsevier Interactive Patient Education  2017 [employer]  

## 2017-11-02 DIAGNOSIS — E119 Type 2 diabetes mellitus without complications: Secondary | ICD-10-CM | POA: Diagnosis not present

## 2017-11-02 DIAGNOSIS — I10 Essential (primary) hypertension: Secondary | ICD-10-CM | POA: Diagnosis not present

## 2017-11-02 DIAGNOSIS — Z7984 Long term (current) use of oral hypoglycemic drugs: Secondary | ICD-10-CM | POA: Diagnosis not present

## 2018-03-29 ENCOUNTER — Telehealth: Payer: Self-pay | Admitting: Adult Health

## 2018-03-29 DIAGNOSIS — E1159 Type 2 diabetes mellitus with other circulatory complications: Secondary | ICD-10-CM | POA: Diagnosis not present

## 2018-03-29 DIAGNOSIS — E119 Type 2 diabetes mellitus without complications: Secondary | ICD-10-CM | POA: Diagnosis not present

## 2018-03-29 DIAGNOSIS — I10 Essential (primary) hypertension: Secondary | ICD-10-CM | POA: Diagnosis not present

## 2018-03-29 NOTE — Telephone Encounter (Signed)
Patient called to cancel and wanted to reschedule however could not be reached

## 2018-03-30 ENCOUNTER — Inpatient Hospital Stay: Payer: 59 | Admitting: Adult Health

## 2018-04-05 ENCOUNTER — Ambulatory Visit: Payer: 59 | Admitting: Internal Medicine

## 2018-04-26 ENCOUNTER — Other Ambulatory Visit (INDEPENDENT_AMBULATORY_CARE_PROVIDER_SITE_OTHER): Payer: 59

## 2018-04-26 ENCOUNTER — Encounter: Payer: Self-pay | Admitting: Internal Medicine

## 2018-04-26 ENCOUNTER — Ambulatory Visit: Payer: 59 | Admitting: Internal Medicine

## 2018-04-26 VITALS — BP 126/84 | HR 77 | Temp 98.3°F | Ht <= 58 in | Wt 247.0 lb

## 2018-04-26 DIAGNOSIS — D509 Iron deficiency anemia, unspecified: Secondary | ICD-10-CM | POA: Diagnosis not present

## 2018-04-26 DIAGNOSIS — R928 Other abnormal and inconclusive findings on diagnostic imaging of breast: Secondary | ICD-10-CM | POA: Diagnosis not present

## 2018-04-26 DIAGNOSIS — F419 Anxiety disorder, unspecified: Secondary | ICD-10-CM

## 2018-04-26 DIAGNOSIS — F329 Major depressive disorder, single episode, unspecified: Secondary | ICD-10-CM

## 2018-04-26 DIAGNOSIS — E119 Type 2 diabetes mellitus without complications: Secondary | ICD-10-CM

## 2018-04-26 DIAGNOSIS — Z0000 Encounter for general adult medical examination without abnormal findings: Secondary | ICD-10-CM

## 2018-04-26 DIAGNOSIS — Z853 Personal history of malignant neoplasm of breast: Secondary | ICD-10-CM | POA: Diagnosis not present

## 2018-04-26 DIAGNOSIS — F32A Depression, unspecified: Secondary | ICD-10-CM

## 2018-04-26 LAB — LIPID PANEL
Cholesterol: 169 mg/dL (ref 0–200)
HDL: 72.4 mg/dL (ref >39.00)
LDL Cholesterol: 76 mg/dL (ref 0–99)
NonHDL: 96.38
Total CHOL/HDL Ratio: 2
Triglycerides: 104 mg/dL (ref 0.0–149.0)
VLDL: 20.8 mg/dL (ref 0.0–40.0)

## 2018-04-26 LAB — CBC WITH DIFFERENTIAL/PLATELET
Basophils Absolute: 0 10*3/uL (ref 0.0–0.1)
Basophils Relative: 0.8 % (ref 0.0–3.0)
Eosinophils Absolute: 0.1 10*3/uL (ref 0.0–0.7)
Eosinophils Relative: 1.5 % (ref 0.0–5.0)
HCT: 39.4 % (ref 36.0–46.0)
Hemoglobin: 12.7 g/dL (ref 12.0–15.0)
Lymphocytes Relative: 27.2 % (ref 12.0–46.0)
Lymphs Abs: 1.5 10*3/uL (ref 0.7–4.0)
MCHC: 32.3 g/dL (ref 30.0–36.0)
MCV: 86.3 fl (ref 78.0–100.0)
Monocytes Absolute: 0.5 10*3/uL (ref 0.1–1.0)
Monocytes Relative: 9.4 % (ref 3.0–12.0)
Neutro Abs: 3.4 10*3/uL (ref 1.4–7.7)
Neutrophils Relative %: 61.1 % (ref 43.0–77.0)
Platelets: 257 10*3/uL (ref 150.0–400.0)
RBC: 4.57 Mil/uL (ref 3.87–5.11)
RDW: 15.2 % (ref 11.5–15.5)
WBC: 5.6 10*3/uL (ref 4.0–10.5)

## 2018-04-26 LAB — URINALYSIS, ROUTINE W REFLEX MICROSCOPIC
Bilirubin Urine: NEGATIVE
Ketones, ur: NEGATIVE
Leukocytes, UA: NEGATIVE
Nitrite: NEGATIVE
Specific Gravity, Urine: 1.025 (ref 1.000–1.030)
Total Protein, Urine: NEGATIVE
Urine Glucose: NEGATIVE
Urobilinogen, UA: 0.2 (ref 0.0–1.0)
pH: 5.5 (ref 5.0–8.0)

## 2018-04-26 LAB — HEPATIC FUNCTION PANEL
ALT: 13 U/L (ref 0–35)
AST: 18 U/L (ref 0–37)
Albumin: 4 g/dL (ref 3.5–5.2)
Alkaline Phosphatase: 48 U/L (ref 39–117)
Bilirubin, Direct: 0.1 mg/dL (ref 0.0–0.3)
Total Bilirubin: 0.4 mg/dL (ref 0.2–1.2)
Total Protein: 8.6 g/dL — ABNORMAL HIGH (ref 6.0–8.3)

## 2018-04-26 LAB — BASIC METABOLIC PANEL
BUN: 11 mg/dL (ref 6–23)
CO2: 30 mEq/L (ref 19–32)
Calcium: 9.7 mg/dL (ref 8.4–10.5)
Chloride: 103 mEq/L (ref 96–112)
Creatinine, Ser: 0.61 mg/dL (ref 0.40–1.20)
GFR: 128.85 mL/min (ref >60.00)
Glucose, Bld: 89 mg/dL (ref 70–99)
Potassium: 4.7 mEq/L (ref 3.5–5.1)
Sodium: 138 mEq/L (ref 135–145)

## 2018-04-26 LAB — MICROALBUMIN / CREATININE URINE RATIO
Creatinine,U: 152.5 mg/dL
Microalb Creat Ratio: 0.6 mg/g (ref 0.0–30.0)
Microalb, Ur: 0.9 mg/dL (ref 0.0–1.9)

## 2018-04-26 LAB — HM MAMMOGRAPHY

## 2018-04-26 LAB — TSH: TSH: 2.15 u[IU]/mL (ref 0.35–4.50)

## 2018-04-26 NOTE — Assessment & Plan Note (Signed)
Also for iron level f/u 

## 2018-04-26 NOTE — Patient Instructions (Signed)
Please continue all other medications as before, and refills have been done if requested.  Please have the pharmacy call with any other refills you may need.  Please continue your efforts at being more active, low cholesterol diet, and weight control.  You are otherwise up to date with prevention measures today.  Please keep your appointments with your specialists as you may have planned  Please go to the LAB in the Basement (turn left off the elevator) for the tests to be done today  You will be contacted by phone if any changes need to be made immediately.  Otherwise, you will receive a letter about your results with an explanation, but please check with MyChart first.  Please remember to sign up for MyChart if you have not done so, as this will be important to you in the future with finding out test results, communicating by private email, and scheduling acute appointments online when needed.  Please return in 1 year for your yearly visit, or sooner if needed, with Lab testing done 3-5 days before  
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
## 2018-04-26 NOTE — Assessment & Plan Note (Signed)
Follows with endo at [facility], cont same tx

## 2018-04-26 NOTE — Assessment & Plan Note (Signed)

## 2018-04-26 NOTE — Assessment & Plan Note (Signed)
Chronic mild persistent, declines referral for counsleing or zoloft 50 qd but will call if needs this

## 2018-05-10 DIAGNOSIS — E119 Type 2 diabetes mellitus without complications: Secondary | ICD-10-CM | POA: Diagnosis not present

## 2018-05-16 ENCOUNTER — Encounter: Payer: Self-pay | Admitting: Internal Medicine

## 2018-05-16 ENCOUNTER — Ambulatory Visit: Payer: 59 | Admitting: Internal Medicine

## 2018-05-16 VITALS — BP 132/88 | HR 73 | Temp 98.8°F | Ht <= 58 in | Wt 247.0 lb

## 2018-05-16 DIAGNOSIS — J029 Acute pharyngitis, unspecified: Secondary | ICD-10-CM | POA: Diagnosis not present

## 2018-05-16 LAB — POCT RAPID STREP A (OFFICE): Rapid Strep A Screen: NEGATIVE

## 2018-05-16 NOTE — Assessment & Plan Note (Signed)
Likely allergic, recommended zyrtec. POC strep test done in the office for white plaque on tonsils negative ruling out bacterial infection. Advised on hand hygiene.

## 2018-05-16 NOTE — Progress Notes (Signed)
   Subjective:    Patient ID: [name], female    DOB: July 18, 1956, 62 y.o.   MRN: [id number]  HPI The patient is a 62 YO female coming in for sore throat for about 2-3 days. Started with some sore throat and mild nose drainage. She has chronic nose swelling and pressure. She does have some left ear pressure. Does not take any allergy medicine. Denies fevers or chills. Denies cough or SOB. Denies headaches. Overall stable since onset. Has not tried anything over the counter for it.   Review of Systems  Constitutional: Positive for activity change. Negative for appetite change, chills, fatigue, fever and unexpected weight change.  HENT: Positive for congestion, postnasal drip, rhinorrhea and sinus pressure. Negative for ear discharge, ear pain, sinus pain, sneezing, sore throat, tinnitus, trouble swallowing and voice change.   Eyes: Negative.   Respiratory: Negative for cough, chest tightness, shortness of breath and wheezing.   Cardiovascular: Negative.   Gastrointestinal: Negative.   Musculoskeletal: Negative.   Neurological: Negative.       Objective:   Physical Exam  Constitutional: She is oriented to person, place, and time. She appears well-developed and well-nourished.  HENT:  Head: Normocephalic and atraumatic.  Oropharynx with redness and clear drainage small white plaque left tonsil, nose with swollen turbinates, TMs normal bilaterally  Eyes: EOM are normal.  Neck: Normal range of motion. No thyromegaly present.  Cardiovascular: Normal rate and regular rhythm.  Pulmonary/Chest: Effort normal and breath sounds normal. No respiratory distress. She has no wheezes. She has no rales.  Abdominal: Soft.  Lymphadenopathy:    She has no cervical adenopathy.  Neurological: She is alert and oriented to person, place, and time.  Skin: Skin is warm and dry.   Vitals:   05/16/18 1538  BP: 132/88  Pulse: 73  Temp: 98.8 F (37.1 C)  TempSrc: Oral  SpO2: 96%  Weight: 247  lb (112 kg)  Height: 4\' 10"  (1.473 m)   POC strep: negative    Assessment & Plan:

## 2018-05-16 NOTE — Patient Instructions (Signed)
This is likely a virus causing your problems. You do not have strep throat.  We recommend to take zyrtec (cetirizine) to take 1 pill daily. This should help in 2-3 days.

## 2018-05-24 DIAGNOSIS — L98491 Non-pressure chronic ulcer of skin of other sites limited to breakdown of skin: Secondary | ICD-10-CM | POA: Diagnosis not present

## 2018-05-24 DIAGNOSIS — L98499 Non-pressure chronic ulcer of skin of other sites with unspecified severity: Secondary | ICD-10-CM | POA: Diagnosis not present

## 2018-06-10 ENCOUNTER — Ambulatory Visit (INDEPENDENT_AMBULATORY_CARE_PROVIDER_SITE_OTHER): Payer: 59

## 2018-06-10 ENCOUNTER — Encounter: Payer: Self-pay | Admitting: Podiatry

## 2018-06-10 ENCOUNTER — Ambulatory Visit: Payer: 59 | Admitting: Podiatry

## 2018-06-10 DIAGNOSIS — M21619 Bunion of unspecified foot: Secondary | ICD-10-CM | POA: Diagnosis not present

## 2018-06-10 DIAGNOSIS — R339 Retention of urine, unspecified: Secondary | ICD-10-CM | POA: Insufficient documentation

## 2018-06-10 DIAGNOSIS — M2042 Other hammer toe(s) (acquired), left foot: Secondary | ICD-10-CM

## 2018-06-10 DIAGNOSIS — M79674 Pain in right toe(s): Secondary | ICD-10-CM | POA: Diagnosis not present

## 2018-06-10 DIAGNOSIS — B351 Tinea unguium: Secondary | ICD-10-CM | POA: Diagnosis not present

## 2018-06-10 DIAGNOSIS — M79675 Pain in left toe(s): Secondary | ICD-10-CM | POA: Diagnosis not present

## 2018-06-10 DIAGNOSIS — R350 Frequency of micturition: Secondary | ICD-10-CM | POA: Insufficient documentation

## 2018-06-10 DIAGNOSIS — R319 Hematuria, unspecified: Secondary | ICD-10-CM | POA: Insufficient documentation

## 2018-06-10 DIAGNOSIS — M2041 Other hammer toe(s) (acquired), right foot: Secondary | ICD-10-CM | POA: Diagnosis not present

## 2018-06-10 DIAGNOSIS — B009 Herpesviral infection, unspecified: Secondary | ICD-10-CM | POA: Insufficient documentation

## 2018-06-10 NOTE — Patient Instructions (Signed)
Pre-Operative Instructions  Congratulations, you have decided to take an important step towards improving your quality of life.  You can be assured that the doctors and staff at [facility] will be with you every step of the way.  Here are some important things you should know:  1. Plan to be at the surgery center/hospital at least 1 (one) hour prior to your scheduled time, unless otherwise directed by the surgical center/hospital staff.  You must have a responsible adult accompany you, remain during the surgery and drive you home.  Make sure you have directions to the surgical center/hospital to ensure you arrive on time. 2. If you are having surgery at [facility] or [facility] hospitals, you will need a copy of your medical history and physical form from your family physician within one month prior to the date of surgery. We will give you a form for your primary physician to complete.  3. We make every effort to accommodate the date you request for surgery.  However, there are times where surgery dates or times have to be moved.  We will contact you as soon as possible if a change in schedule is required.   4. No aspirin/ibuprofen for one week before surgery.  If you are on aspirin, any non-steroidal anti-inflammatory medications (Mobic, Aleve, Ibuprofen) should not be taken seven (7) days prior to your surgery.  You make take Tylenol for pain prior to surgery.  5. Medications - If you are taking daily heart and blood pressure medications, seizure, reflux, allergy, asthma, anxiety, pain or diabetes medications, make sure you notify the surgery center/hospital before the day of surgery so they can tell you which medications you should take or avoid the day of surgery. 6. No food or drink after midnight the night before surgery unless directed otherwise by surgical center/hospital staff. 7. No alcoholic beverages 24-hours prior to surgery.  No smoking 24-hours prior or 24-hours after  surgery. 8. Wear loose pants or shorts. They should be loose enough to fit over bandages, boots, and casts. 9. Don't wear slip-on shoes. Sneakers are preferred. 10. Bring your boot with you to the surgery center/hospital.  Also bring crutches or a walker if your physician has prescribed it for you.  If you do not have this equipment, it will be provided for you after surgery. 11. If you have not been contacted by the surgery center/hospital by the day before your surgery, call to confirm the date and time of your surgery. 12. Leave-time from work may vary depending on the type of surgery you have.  Appropriate arrangements should be made prior to surgery with your employer. 13. Prescriptions will be provided immediately following surgery by your doctor.  Fill these as soon as possible after surgery and take the medication as directed. Pain medications will not be refilled on weekends and must be approved by the doctor. 14. Remove nail polish on the operative foot and avoid getting pedicures prior to surgery. 15. Wash the night before surgery.  The night before surgery wash the foot and leg well with water and the antibacterial soap provided. Be sure to pay special attention to beneath the toenails and in between the toes.  Wash for at least three (3) minutes. Rinse thoroughly with water and dry well with a towel.  Perform this wash unless told not to do so by your physician.  Enclosed: 1 Ice pack (please put in freezer the night before surgery)   1 Hibiclens skin cleaner     Pre-op instructions  If you have any questions regarding the instructions, please do not hesitate to call our office.  [facility]: [address], [place], [place] [address] -- [phone]  [facility]: [address] [place], [place] [address] -- [phone]  [place]: [address]  [place], [place] [address] -- [phone]  [place]: [address], [address], [place], [place] [address] -- [phone]  Website:  [email or web address] 

## 2018-06-12 NOTE — Progress Notes (Signed)
Subjective:   Patient ID: [name], female   DOB: 62 y.o.   MRN: [id number]   HPI Patient presents stating I have got these really bad toenails and my bunion is really started to bother me more right over left foot with a lesion on the fifth digit which is increasingly tender and making it hard to walk.  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  I do think due to the long-standing nature of her condition surgical intervention is indicated she wants to get this done and at this point I allowed her to read consent form for the right foot correction going over at great length alternative treatments and complications associated with surgery and she is willing to accept risk.  She signed consent form after extensive review understanding told recovery can take 6 months to 1 year and I did dispense air fracture walker for the postoperative.  I then went ahead and debrided nailbeds 1-5 both feet with no iatrogenic bleeding and I scheduled her date and encouraged her to call with any questions she may have prior to procedure  X-rays indicate significant structural  bunion deformity with rotated fifth digit right over left

## 2018-06-13 ENCOUNTER — Encounter: Payer: Self-pay | Admitting: *Deleted

## 2018-06-13 ENCOUNTER — Telehealth: Payer: Self-pay | Admitting: *Deleted

## 2018-06-13 NOTE — Telephone Encounter (Signed)
Pt states she needs a note for work to wear athletic shoes.

## 2018-06-13 NOTE — Telephone Encounter (Signed)
Pt called states she would like the letter faxed to [phone]. Faxed orders to pt's fax to give to her HR.

## 2018-06-13 NOTE — Telephone Encounter (Signed)
Left message informing pt I would write a note that stated she was to wear athletic shoes until reevaluated.

## 2018-06-28 DIAGNOSIS — E119 Type 2 diabetes mellitus without complications: Secondary | ICD-10-CM | POA: Diagnosis not present

## 2018-06-28 DIAGNOSIS — Z7984 Long term (current) use of oral hypoglycemic drugs: Secondary | ICD-10-CM | POA: Diagnosis not present

## 2018-07-08 ENCOUNTER — Ambulatory Visit: Payer: 59 | Admitting: Podiatry

## 2018-07-08 DIAGNOSIS — B353 Tinea pedis: Secondary | ICD-10-CM | POA: Diagnosis not present

## 2018-07-08 DIAGNOSIS — M2042 Other hammer toe(s) (acquired), left foot: Secondary | ICD-10-CM | POA: Diagnosis not present

## 2018-07-08 DIAGNOSIS — S99922A Unspecified injury of left foot, initial encounter: Secondary | ICD-10-CM

## 2018-07-08 DIAGNOSIS — M21962 Unspecified acquired deformity of left lower leg: Secondary | ICD-10-CM

## 2018-07-08 DIAGNOSIS — M779 Enthesopathy, unspecified: Secondary | ICD-10-CM | POA: Diagnosis not present

## 2018-07-08 MED ORDER — KETOCONAZOLE 2 % EX CREA: TOPICAL_CREAM | CUTANEOUS | 0 refills | Status: DC
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
## 2018-07-13 ENCOUNTER — Ambulatory Visit: Payer: 59 | Admitting: Podiatry

## 2018-07-29 ENCOUNTER — Ambulatory Visit: Payer: 59 | Admitting: Podiatry

## 2018-09-11 ENCOUNTER — Other Ambulatory Visit: Payer: Self-pay | Admitting: Internal Medicine

## 2018-10-04 ENCOUNTER — Other Ambulatory Visit: Payer: Self-pay | Admitting: Internal Medicine

## 2018-12-20 DIAGNOSIS — E119 Type 2 diabetes mellitus without complications: Secondary | ICD-10-CM | POA: Diagnosis not present

## 2018-12-20 DIAGNOSIS — I10 Essential (primary) hypertension: Secondary | ICD-10-CM | POA: Diagnosis not present

## 2018-12-20 DIAGNOSIS — Z7984 Long term (current) use of oral hypoglycemic drugs: Secondary | ICD-10-CM | POA: Diagnosis not present

## 2019-04-27 ENCOUNTER — Encounter: Payer: Self-pay | Admitting: Internal Medicine

## 2019-04-27 LAB — HM MAMMOGRAPHY

## 2019-04-28 ENCOUNTER — Other Ambulatory Visit (INDEPENDENT_AMBULATORY_CARE_PROVIDER_SITE_OTHER): Payer: 59

## 2019-04-28 ENCOUNTER — Telehealth: Payer: Self-pay

## 2019-04-28 ENCOUNTER — Other Ambulatory Visit: Payer: Self-pay

## 2019-04-28 ENCOUNTER — Encounter: Payer: Self-pay | Admitting: Internal Medicine

## 2019-04-28 ENCOUNTER — Other Ambulatory Visit: Payer: Self-pay | Admitting: Internal Medicine

## 2019-04-28 ENCOUNTER — Ambulatory Visit (INDEPENDENT_AMBULATORY_CARE_PROVIDER_SITE_OTHER): Payer: 59 | Admitting: Internal Medicine

## 2019-04-28 ENCOUNTER — Other Ambulatory Visit: Payer: Self-pay | Admitting: Radiology

## 2019-04-28 VITALS — BP 126/84 | HR 79 | Temp 98.1°F | Ht <= 58 in | Wt 257.0 lb

## 2019-04-28 DIAGNOSIS — E119 Type 2 diabetes mellitus without complications: Secondary | ICD-10-CM

## 2019-04-28 DIAGNOSIS — Z0001 Encounter for general adult medical examination with abnormal findings: Secondary | ICD-10-CM | POA: Insufficient documentation

## 2019-04-28 DIAGNOSIS — E559 Vitamin D deficiency, unspecified: Secondary | ICD-10-CM | POA: Diagnosis not present

## 2019-04-28 DIAGNOSIS — Z114 Encounter for screening for human immunodeficiency virus [HIV]: Secondary | ICD-10-CM

## 2019-04-28 DIAGNOSIS — E538 Deficiency of other specified B group vitamins: Secondary | ICD-10-CM

## 2019-04-28 DIAGNOSIS — I10 Essential (primary) hypertension: Secondary | ICD-10-CM

## 2019-04-28 DIAGNOSIS — N318 Other neuromuscular dysfunction of bladder: Secondary | ICD-10-CM

## 2019-04-28 DIAGNOSIS — M79606 Pain in leg, unspecified: Secondary | ICD-10-CM | POA: Diagnosis not present

## 2019-04-28 DIAGNOSIS — E611 Iron deficiency: Secondary | ICD-10-CM

## 2019-04-28 DIAGNOSIS — E785 Hyperlipidemia, unspecified: Secondary | ICD-10-CM

## 2019-04-28 DIAGNOSIS — R339 Retention of urine, unspecified: Secondary | ICD-10-CM

## 2019-04-28 LAB — HEPATIC FUNCTION PANEL
ALT: 16 U/L (ref 0–35)
AST: 14 U/L (ref 0–37)
Albumin: 3.8 g/dL (ref 3.5–5.2)
Alkaline Phosphatase: 46 U/L (ref 39–117)
Bilirubin, Direct: 0.1 mg/dL (ref 0.0–0.3)
Total Bilirubin: 0.5 mg/dL (ref 0.2–1.2)
Total Protein: 8 g/dL (ref 6.0–8.3)

## 2019-04-28 LAB — URINALYSIS, ROUTINE W REFLEX MICROSCOPIC
Bilirubin Urine: NEGATIVE
Ketones, ur: NEGATIVE
Leukocytes,Ua: NEGATIVE
Nitrite: NEGATIVE
Specific Gravity, Urine: >=1.03 — AB (ref 1.000–1.030)
Total Protein, Urine: NEGATIVE
Urine Glucose: NEGATIVE
Urobilinogen, UA: 0.2 (ref 0.0–1.0)
pH: 5.5 (ref 5.0–8.0)

## 2019-04-28 LAB — LIPID PANEL
Cholesterol: 157 mg/dL (ref 0–200)
HDL: 61.9 mg/dL (ref >39.00)
LDL Cholesterol: 78 mg/dL (ref 0–99)
NonHDL: 95.46
Total CHOL/HDL Ratio: 3
Triglycerides: 86 mg/dL (ref 0.0–149.0)
VLDL: 17.2 mg/dL (ref 0.0–40.0)

## 2019-04-28 LAB — BASIC METABOLIC PANEL
BUN: 16 mg/dL (ref 6–23)
CO2: 28 mEq/L (ref 19–32)
Calcium: 9.1 mg/dL (ref 8.4–10.5)
Chloride: 105 mEq/L (ref 96–112)
Creatinine, Ser: 0.69 mg/dL (ref 0.40–1.20)
GFR: 104.8 mL/min (ref >60.00)
Glucose, Bld: 142 mg/dL — ABNORMAL HIGH (ref 70–99)
Potassium: 3.8 mEq/L (ref 3.5–5.1)
Sodium: 141 mEq/L (ref 135–145)

## 2019-04-28 LAB — MICROALBUMIN / CREATININE URINE RATIO
Creatinine,U: 196.9 mg/dL
Microalb Creat Ratio: 0.6 mg/g (ref 0.0–30.0)
Microalb, Ur: 1.2 mg/dL (ref 0.0–1.9)

## 2019-04-28 LAB — CBC WITH DIFFERENTIAL/PLATELET
Basophils Absolute: 0.1 10*3/uL (ref 0.0–0.1)
Basophils Relative: 1.1 % (ref 0.0–3.0)
Eosinophils Absolute: 0.1 10*3/uL (ref 0.0–0.7)
Eosinophils Relative: 1.1 % (ref 0.0–5.0)
HCT: 36.2 % (ref 36.0–46.0)
Hemoglobin: 11.6 g/dL — ABNORMAL LOW (ref 12.0–15.0)
Lymphocytes Relative: 27 % (ref 12.0–46.0)
Lymphs Abs: 1.4 10*3/uL (ref 0.7–4.0)
MCHC: 31.9 g/dL (ref 30.0–36.0)
MCV: 87.5 fl (ref 78.0–100.0)
Monocytes Absolute: 0.5 10*3/uL (ref 0.1–1.0)
Monocytes Relative: 10.1 % (ref 3.0–12.0)
Neutro Abs: 3.1 10*3/uL (ref 1.4–7.7)
Neutrophils Relative %: 60.7 % (ref 43.0–77.0)
Platelets: 213 10*3/uL (ref 150.0–400.0)
RBC: 4.14 Mil/uL (ref 3.87–5.11)
RDW: 14.4 % (ref 11.5–15.5)
WBC: 5.1 10*3/uL (ref 4.0–10.5)

## 2019-04-28 LAB — VITAMIN B12: Vitamin B-12: 463 pg/mL (ref 211–911)

## 2019-04-28 LAB — IBC PANEL
Iron: 78 ug/dL (ref 42–145)
Saturation Ratios: 26.4 % (ref 20.0–50.0)
Transferrin: 211 mg/dL — ABNORMAL LOW (ref 212.0–360.0)

## 2019-04-28 LAB — TSH: TSH: 1.05 u[IU]/mL (ref 0.35–4.50)

## 2019-04-28 LAB — VITAMIN D 25 HYDROXY (VIT D DEFICIENCY, FRACTURES): VITD: <7 ng/mL — ABNORMAL LOW (ref 30.00–100.00)

## 2019-04-28 LAB — HEMOGLOBIN A1C: Hgb A1c MFr Bld: 7.3 % — ABNORMAL HIGH (ref 4.6–6.5)

## 2019-04-28 MED ORDER — TELMISARTAN-HCTZ 80-12.5 MG PO TABS: 1.0000 | ORAL_TABLET | Freq: Every day | ORAL | 3 refills | Status: DC

## 2019-04-28 MED ORDER — DICLOFENAC SODIUM 75 MG PO TBEC: 75.0000 mg | DELAYED_RELEASE_TABLET | Freq: Two times a day (BID) | ORAL | 3 refills | Status: DC | PRN

## 2019-04-28 MED ORDER — VITAMIN D (ERGOCALCIFEROL) 1.25 MG (50000 UNIT) PO CAPS: 50000.0000 [IU] | ORAL_CAPSULE | ORAL | 0 refills | Status: DC

## 2019-04-28 MED ORDER — JARDIANCE 10 MG PO TABS: 10.0000 mg | ORAL_TABLET | Freq: Every day | ORAL | 3 refills | Status: DC

## 2019-04-28 MED ORDER — PRAVASTATIN SODIUM 40 MG PO TABS: 40.0000 mg | ORAL_TABLET | Freq: Every day | ORAL | 3 refills | Status: DC

## 2019-04-28 NOTE — Patient Instructions (Addendum)
Please take all new medication as prescribed - the diclofenac for pain  OK to stop the metformin XR  Please take all new medication as prescribed - the Jardiance 10 mg per day  You will be contacted regarding the referral for: [facility]  Please continue all other medications as before, and refills have been done if requested.  Please have the pharmacy call with any other refills you may need.  Please continue your efforts at being more active, low cholesterol diet, and weight control.  You are otherwise up to date with prevention measures today.  Please keep your appointments with your specialists as you may have planned  Please go to the LAB in the Basement (turn left off the elevator) for the tests to be done today  You will be contacted by phone if any changes need to be made immediately.  Otherwise, you will receive a letter about your results with an explanation, but please check with MyChart first.  Please remember to sign up for MyChart if you have not done so, as this will be important to you in the future with finding out test results, communicating by private email, and scheduling acute appointments online when needed.  Please return in 1 year for your yearly visit, or sooner if needed, with Lab testing done 3-5 days before

## 2019-04-28 NOTE — Telephone Encounter (Signed)
-----   Message from Biagio Borg, MD sent at 04/28/2019  1:00 PM EDT ----- Left message on MyChart, pt to cont same tx except  The test results show that your current treatment is OK, as the tests are stable, except the Vitamin D level is very low.  Please take Vitamin D 50000 units weekly for 12 weeks, then plan to change to OTC Vitamin D3 at 2000 units per day, indefinitely.    Armon Orvis to please inform pt, I will do rx
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
## 2019-04-28 NOTE — Telephone Encounter (Signed)
Called pt, LVM.   

## 2019-04-28 NOTE — Telephone Encounter (Signed)
Pt has been informed of results and expressed understanding.  °

## 2019-04-29 ENCOUNTER — Encounter: Payer: Self-pay | Admitting: Internal Medicine

## 2019-04-29 DIAGNOSIS — E559 Vitamin D deficiency, unspecified: Secondary | ICD-10-CM | POA: Insufficient documentation

## 2019-04-29 LAB — HIV ANTIBODY (ROUTINE TESTING W REFLEX): HIV 1&2 Ab, 4th Generation: NONREACTIVE

## 2019-04-29 NOTE — Assessment & Plan Note (Signed)
For f/u lab, may need oral replacement 

## 2019-04-29 NOTE — Assessment & Plan Note (Signed)
To restart pravastatin 40 qd,  to f/u any worsening symptoms or concerns

## 2019-04-29 NOTE — Assessment & Plan Note (Signed)
Ongoing, ok for diclofenac bid prn,  to f/u any worsening symptoms or concerns

## 2019-04-29 NOTE — Assessment & Plan Note (Addendum)
Pt reqeusts second opinion - will refer to [facility]  In addition to the time spent performing CPE, I spent an additional 25 minutes face to face,in which greater than 50% of this time was spent in counseling and coordination of care for patient's acute illness as documented, including the differential dx, treatment, further evaluation and other management of urinary retention, DM, HLD, HTN, DJD, and vit d deficiency

## 2019-04-29 NOTE — Assessment & Plan Note (Signed)
stable overall by history and exam, recent data reviewed with pt, and pt to continue medical treatment as before except to change metformin to jardiance due to GI upset,  to f/u any worsening symptoms or concerns

## 2019-04-29 NOTE — Assessment & Plan Note (Signed)
stable overall by history and exam, recent data reviewed with pt, and pt to continue medical treatment as before,  to f/u any worsening symptoms or concerns  

## 2019-04-29 NOTE — Assessment & Plan Note (Signed)

## 2019-05-16 ENCOUNTER — Other Ambulatory Visit: Payer: Self-pay | Admitting: General Surgery

## 2019-05-16 DIAGNOSIS — C50912 Malignant neoplasm of unspecified site of left female breast: Secondary | ICD-10-CM

## 2019-05-16 NOTE — Telephone Encounter (Signed)
Spoke to pt and she states there was only 4 pills of the Vitamin D 50000 units and she was told to take for 12 weeks. I did see the prescription has quantity of 12

## 2019-05-17 ENCOUNTER — Encounter: Payer: Self-pay | Admitting: Adult Health

## 2019-05-25 ENCOUNTER — Telehealth: Payer: Self-pay | Admitting: *Deleted

## 2019-05-25 ENCOUNTER — Other Ambulatory Visit: Payer: Self-pay | Admitting: Oncology

## 2019-05-25 DIAGNOSIS — Z8542 Personal history of malignant neoplasm of other parts of uterus: Secondary | ICD-10-CM | POA: Insufficient documentation

## 2019-05-25 DIAGNOSIS — C55 Malignant neoplasm of uterus, part unspecified: Secondary | ICD-10-CM | POA: Insufficient documentation

## 2019-05-25 HISTORY — DX: Malignant neoplasm of uterus, part unspecified: C55

## 2019-05-25 NOTE — Telephone Encounter (Signed)
This RN sreceived VM from pt stating she is awaiting a return call regarding a referral from Dr [name] .  Return call number given as [phone].  This RN reviewed chart and noted referral sent on 05/16/2019 from Dr [name] - per pathology on 04/28/2019.  Discussed above with MD - who gave appointment date and time as 06/02/2019 at 24 am.  MD sent appointment request.  This RN returned call to pt and obtained VM- general message left per appt with this RN's name of office number for return call.

## 2019-05-29 ENCOUNTER — Telehealth: Payer: Self-pay | Admitting: Oncology

## 2019-05-29 NOTE — Telephone Encounter (Signed)
Pt cld and lft a vm to confirmed appt w/Dr. Jana Hakim on 8/14 at 9am.

## 2019-05-29 NOTE — Telephone Encounter (Signed)
Received a referral from Dr. [name] for a new dx of breast cancer. Per sch msg from Dr. Jana Hakim, pt can be scheduled to see him on 8/14 at 9am. I cld and lft the appt date and time on the pt's vm. I asked that she callback to confirm.

## 2019-06-01 ENCOUNTER — Telehealth: Payer: Self-pay | Admitting: Oncology

## 2019-06-01 NOTE — Telephone Encounter (Signed)
Scheduled appt per 8/13 sch message - pt is aware of appt date and time.  

## 2019-06-02 ENCOUNTER — Other Ambulatory Visit: Payer: Self-pay

## 2019-06-02 ENCOUNTER — Inpatient Hospital Stay: Payer: 59 | Attending: Oncology | Admitting: Oncology

## 2019-06-02 VITALS — BP 136/86 | HR 61 | Temp 98.2°F | Resp 17 | Ht <= 58 in | Wt 252.0 lb

## 2019-06-02 DIAGNOSIS — Z79899 Other long term (current) drug therapy: Secondary | ICD-10-CM | POA: Insufficient documentation

## 2019-06-02 DIAGNOSIS — D0512 Intraductal carcinoma in situ of left breast: Secondary | ICD-10-CM

## 2019-06-02 DIAGNOSIS — I10 Essential (primary) hypertension: Secondary | ICD-10-CM | POA: Insufficient documentation

## 2019-06-02 DIAGNOSIS — E119 Type 2 diabetes mellitus without complications: Secondary | ICD-10-CM | POA: Insufficient documentation

## 2019-06-02 DIAGNOSIS — C50112 Malignant neoplasm of central portion of left female breast: Secondary | ICD-10-CM | POA: Diagnosis not present

## 2019-06-02 DIAGNOSIS — Z853 Personal history of malignant neoplasm of breast: Secondary | ICD-10-CM | POA: Insufficient documentation

## 2019-06-02 DIAGNOSIS — Z803 Family history of malignant neoplasm of breast: Secondary | ICD-10-CM | POA: Insufficient documentation

## 2019-06-02 DIAGNOSIS — M25562 Pain in left knee: Secondary | ICD-10-CM | POA: Insufficient documentation

## 2019-06-02 DIAGNOSIS — Z171 Estrogen receptor negative status [ER-]: Secondary | ICD-10-CM | POA: Diagnosis not present

## 2019-06-02 DIAGNOSIS — Z90710 Acquired absence of both cervix and uterus: Secondary | ICD-10-CM | POA: Insufficient documentation

## 2019-06-02 DIAGNOSIS — E7800 Pure hypercholesterolemia, unspecified: Secondary | ICD-10-CM | POA: Insufficient documentation

## 2019-06-02 DIAGNOSIS — C50412 Malignant neoplasm of upper-outer quadrant of left female breast: Secondary | ICD-10-CM

## 2019-06-02 DIAGNOSIS — E1165 Type 2 diabetes mellitus with hyperglycemia: Secondary | ICD-10-CM | POA: Diagnosis not present

## 2019-06-02 DIAGNOSIS — Z8542 Personal history of malignant neoplasm of other parts of uterus: Secondary | ICD-10-CM | POA: Diagnosis not present

## 2019-06-02 DIAGNOSIS — Z923 Personal history of irradiation: Secondary | ICD-10-CM | POA: Diagnosis not present

## 2019-06-02 DIAGNOSIS — C541 Malignant neoplasm of endometrium: Secondary | ICD-10-CM | POA: Diagnosis not present

## 2019-06-02 DIAGNOSIS — Z170 Estrogen receptor positive status [ER+]: Secondary | ICD-10-CM
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
## 2019-06-05 ENCOUNTER — Telehealth: Payer: Self-pay | Admitting: Oncology

## 2019-06-05 ENCOUNTER — Telehealth: Payer: Self-pay

## 2019-06-05 NOTE — Telephone Encounter (Signed)
Scheduled per 08/14 los, patient has been called and notified. 

## 2019-06-05 NOTE — Telephone Encounter (Addendum)
Very sorry, but I find no documentation in the chart as to any evaluation already done  Pt would need OV for evaluation, and neurology if then appropriate

## 2019-06-05 NOTE — Telephone Encounter (Signed)
Copied from [place] [id number]. Topic: Referral - Request for Referral >> Jun 05, 2019  2:41 PM Virl Axe D wrote: Has patient seen PCP for this complaint? No *If NO, is insurance requiring patient see PCP for this issue before PCP can refer them? Referral for which specialty: Neurology Preferred provider/office: [facility] Reason for referral: Pt stated she fell and hit her head Friday and went to the ER. Advised she may need appt with Dr. Jenny Reichmann first but pt would like referral sent as soon as possible

## 2019-06-06 ENCOUNTER — Encounter: Payer: Self-pay | Admitting: Internal Medicine

## 2019-06-06 ENCOUNTER — Other Ambulatory Visit: Payer: Self-pay

## 2019-06-06 ENCOUNTER — Ambulatory Visit (INDEPENDENT_AMBULATORY_CARE_PROVIDER_SITE_OTHER): Payer: 59 | Admitting: Internal Medicine

## 2019-06-06 VITALS — BP 132/84 | HR 70 | Temp 97.9°F | Ht <= 58 in | Wt 253.0 lb

## 2019-06-06 DIAGNOSIS — I10 Essential (primary) hypertension: Secondary | ICD-10-CM | POA: Diagnosis not present

## 2019-06-06 DIAGNOSIS — R51 Headache: Secondary | ICD-10-CM | POA: Diagnosis not present

## 2019-06-06 DIAGNOSIS — E119 Type 2 diabetes mellitus without complications: Secondary | ICD-10-CM | POA: Diagnosis not present

## 2019-06-06 DIAGNOSIS — R519 Headache, unspecified: Secondary | ICD-10-CM

## 2019-06-06 NOTE — Assessment & Plan Note (Signed)
stable overall by history and exam, recent data reviewed with pt, and pt to continue medical treatment as before,  to f/u any worsening symptoms or concerns  

## 2019-06-06 NOTE — Patient Instructions (Signed)
.  You will be contacted regarding the referral for: CT head - to see [facility] now  Please continue all other medications as before, and refills have been done if requested.  Please have the pharmacy call with any other refills you may need.  Please keep your appointments with your specialists as you may have planned

## 2019-06-06 NOTE — Telephone Encounter (Signed)
Pt scheduled  
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
## 2019-06-06 NOTE — Assessment & Plan Note (Addendum)
Etiology unclear, exam neg for neuro changes, has pain and tenderness to crown; pt is wanting neuro referal but I think reasonable for head ct stat no CM f/u skull fx or other abnormal,  to f/u any worsening symptoms or concerns  Note:  Total time for pt hx, exam, review of record with pt in the room, determination of diagnoses and plan for further eval and tx is > 40 min, with over 50% spent in coordination and counseling of patient including the differential dx, tx, further evaluation and other management of headache, Dm, HTN

## 2019-06-07 ENCOUNTER — Other Ambulatory Visit: Payer: Self-pay | Admitting: Genetic Counselor

## 2019-06-07 ENCOUNTER — Inpatient Hospital Stay (HOSPITAL_BASED_OUTPATIENT_CLINIC_OR_DEPARTMENT_OTHER): Payer: 59 | Admitting: Genetic Counselor

## 2019-06-07 ENCOUNTER — Ambulatory Visit (INDEPENDENT_AMBULATORY_CARE_PROVIDER_SITE_OTHER)
Admission: RE | Admit: 2019-06-07 | Discharge: 2019-06-07 | Disposition: A | Discharge status: Home / Self Care | Payer: 59 | Source: Ambulatory Visit | Attending: Internal Medicine | Admitting: Internal Medicine

## 2019-06-07 ENCOUNTER — Other Ambulatory Visit: Payer: Self-pay

## 2019-06-07 ENCOUNTER — Encounter: Payer: Self-pay | Admitting: Genetic Counselor

## 2019-06-07 ENCOUNTER — Inpatient Hospital Stay: Payer: 59

## 2019-06-07 DIAGNOSIS — C50412 Malignant neoplasm of upper-outer quadrant of left female breast: Secondary | ICD-10-CM | POA: Diagnosis not present

## 2019-06-07 DIAGNOSIS — Z171 Estrogen receptor negative status [ER-]: Secondary | ICD-10-CM

## 2019-06-07 DIAGNOSIS — R51 Headache: Secondary | ICD-10-CM | POA: Diagnosis not present

## 2019-06-07 DIAGNOSIS — C541 Malignant neoplasm of endometrium: Secondary | ICD-10-CM

## 2019-06-07 DIAGNOSIS — Z803 Family history of malignant neoplasm of breast: Secondary | ICD-10-CM | POA: Insufficient documentation

## 2019-06-07 DIAGNOSIS — R519 Headache, unspecified: Secondary | ICD-10-CM
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
## 2019-06-13 ENCOUNTER — Telehealth: Payer: Self-pay | Admitting: Internal Medicine

## 2019-06-13 NOTE — Telephone Encounter (Signed)
Called pt, LVM.   

## 2019-06-13 NOTE — Telephone Encounter (Signed)
Pt had a scan done on 8/19 and results are in but has not heard any results please FU with pt at [phone]

## 2019-06-13 NOTE — Telephone Encounter (Signed)
Not sure how that happened, but ok to let pt know, the Head CT did not show any acute problem, such as stroke, tumor or other disease

## 2019-06-15 ENCOUNTER — Encounter: Payer: Self-pay | Admitting: Genetic Counselor

## 2019-06-15 ENCOUNTER — Telehealth: Payer: Self-pay | Admitting: Genetic Counselor

## 2019-06-15 ENCOUNTER — Ambulatory Visit: Payer: Self-pay | Admitting: Genetic Counselor

## 2019-06-15 DIAGNOSIS — Z7189 Other specified counseling: Secondary | ICD-10-CM

## 2019-06-15 DIAGNOSIS — Z1379 Encounter for other screening for genetic and chromosomal anomalies: Secondary | ICD-10-CM | POA: Insufficient documentation

## 2019-06-15 HISTORY — DX: Other specified counseling: Z71.89

## 2019-06-15 NOTE — Telephone Encounter (Signed)
Revealed negative genetic testing.  Discussed that we do not know why she has breast and uterine cancer or why there is cancer in the family. It could be due to a different gene that we are not testing, or maybe our current technology may not be able to pick something up.  It will be important for her to keep in contact with genetics to keep up with whether additional testing may be needed.  

## 2019-06-15 NOTE — Telephone Encounter (Signed)
LM on VM that results are back and to please call. 
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
## 2019-07-11 ENCOUNTER — Other Ambulatory Visit: Payer: Self-pay | Admitting: Internal Medicine

## 2019-07-12 NOTE — Telephone Encounter (Signed)
Called pt, LVM.   

## 2019-07-12 NOTE — Telephone Encounter (Signed)
Pt is requesting to have these results. Please advise.   [phone]

## 2019-07-12 NOTE — Telephone Encounter (Signed)
Pt has been informed of results

## 2019-09-01 ENCOUNTER — Telehealth: Payer: Self-pay | Admitting: Oncology

## 2019-09-01 NOTE — Telephone Encounter (Signed)
Returned patient's phone call regarding cancelling 11/16 appointment, per patient's request appointment has been cancelled. Left a voicemail to reschedule.

## 2019-09-04 ENCOUNTER — Inpatient Hospital Stay: Payer: 59 | Admitting: Oncology

## 2019-09-04 ENCOUNTER — Other Ambulatory Visit: Payer: Self-pay | Admitting: Internal Medicine

## 2019-09-22 ENCOUNTER — Ambulatory Visit: Payer: Self-pay | Admitting: Surgery

## 2019-09-22 DIAGNOSIS — D0512 Intraductal carcinoma in situ of left breast: Secondary | ICD-10-CM
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
## 2019-10-06 ENCOUNTER — Ambulatory Visit: Payer: 59 | Admitting: Plastic Surgery

## 2019-10-06 ENCOUNTER — Encounter: Payer: Self-pay | Admitting: Plastic Surgery

## 2019-10-06 ENCOUNTER — Other Ambulatory Visit: Payer: Self-pay

## 2019-10-06 VITALS — BP 136/82 | HR 97 | Temp 97.7°F | Ht 59.0 in | Wt 249.2 lb

## 2019-10-06 DIAGNOSIS — D0512 Intraductal carcinoma in situ of left breast: Secondary | ICD-10-CM | POA: Diagnosis not present
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
## 2019-10-11 ENCOUNTER — Institutional Professional Consult (permissible substitution): Payer: 59 | Admitting: Plastic Surgery

## 2019-10-17 ENCOUNTER — Telehealth: Payer: Self-pay

## 2019-10-17 DIAGNOSIS — E119 Type 2 diabetes mellitus without complications: Secondary | ICD-10-CM

## 2019-10-17 NOTE — Telephone Encounter (Signed)
Reason for CRM: [name] called to report that surgery for reconstruction will not be possible unless pt's A1C levels are below 8.0. They can proceed with the mastectomy just not the reconstruction. [facility]  Best contact: [phone]

## 2019-10-17 NOTE — Telephone Encounter (Signed)
Ok for pt ROV as she has never had an a1c > 8 in recent years, so we just need to document another good a1c it seems

## 2019-10-17 NOTE — Telephone Encounter (Signed)
Copied from [place] [id number]. Topic: General - Other >> Oct 17, 2019  3:00 PM Erick Blinks wrote: Reason for CRM: Claiborne Billings called to report that surgery for reconstruction will not be possible unless pt's A1C levels are below 8.0. They can proceed with the mastectomy just not the reconstruction. [facility]  Best contact: [phone]

## 2019-10-17 NOTE — Telephone Encounter (Signed)
Please advise 

## 2019-10-18 ENCOUNTER — Other Ambulatory Visit: Payer: Self-pay | Admitting: Internal Medicine

## 2019-10-18 DIAGNOSIS — R739 Hyperglycemia, unspecified: Secondary | ICD-10-CM

## 2019-10-18 NOTE — Telephone Encounter (Signed)
Hgba1c preop

## 2019-10-18 NOTE — Addendum Note (Signed)
Addended by: Cindra Presume on: 10/18/2019 04:42 PM   Modules accepted: Orders

## 2019-10-20 DIAGNOSIS — Z5189 Encounter for other specified aftercare: Secondary | ICD-10-CM

## 2019-10-20 HISTORY — DX: Encounter for other specified aftercare: Z51.89

## 2019-10-23 NOTE — Telephone Encounter (Signed)
Lab appointment 10/24/2019

## 2019-10-24 ENCOUNTER — Other Ambulatory Visit: Payer: Self-pay | Admitting: Internal Medicine

## 2019-10-24 ENCOUNTER — Other Ambulatory Visit: Payer: Self-pay | Admitting: Surgery

## 2019-10-24 ENCOUNTER — Other Ambulatory Visit: Payer: Self-pay

## 2019-10-24 ENCOUNTER — Other Ambulatory Visit (INDEPENDENT_AMBULATORY_CARE_PROVIDER_SITE_OTHER): Payer: 59

## 2019-10-24 DIAGNOSIS — D0512 Intraductal carcinoma in situ of left breast: Secondary | ICD-10-CM

## 2019-10-24 DIAGNOSIS — R739 Hyperglycemia, unspecified: Secondary | ICD-10-CM | POA: Diagnosis not present

## 2019-10-24 LAB — HEMOGLOBIN A1C: Hgb A1c MFr Bld: 7.3 % — ABNORMAL HIGH (ref 4.6–6.5)

## 2019-10-24 MED ORDER — JARDIANCE 25 MG PO TABS: 25.0000 mg | ORAL_TABLET | Freq: Every day | ORAL | 3 refills | Status: DC

## 2019-10-25 ENCOUNTER — Encounter: Payer: Self-pay | Admitting: Oncology

## 2019-10-25 ENCOUNTER — Other Ambulatory Visit: Payer: 59

## 2019-10-25 ENCOUNTER — Ambulatory Visit (INDEPENDENT_AMBULATORY_CARE_PROVIDER_SITE_OTHER): Payer: 59 | Admitting: Surgical

## 2019-10-25 DIAGNOSIS — D0512 Intraductal carcinoma in situ of left breast: Secondary | ICD-10-CM

## 2019-10-25 NOTE — Progress Notes (Signed)
No show

## 2019-10-27 ENCOUNTER — Other Ambulatory Visit (HOSPITAL_COMMUNITY): Payer: 59

## 2019-10-30 ENCOUNTER — Encounter: Payer: Self-pay | Admitting: Internal Medicine

## 2019-10-30 ENCOUNTER — Ambulatory Visit (INDEPENDENT_AMBULATORY_CARE_PROVIDER_SITE_OTHER): Payer: 59 | Admitting: Internal Medicine

## 2019-10-30 ENCOUNTER — Other Ambulatory Visit: Payer: Self-pay

## 2019-10-30 ENCOUNTER — Other Ambulatory Visit: Payer: Self-pay | Admitting: Radiology

## 2019-10-30 VITALS — BP 130/84 | HR 70 | Temp 98.5°F | Resp 16 | Ht 59.0 in | Wt 255.0 lb

## 2019-10-30 DIAGNOSIS — L089 Local infection of the skin and subcutaneous tissue, unspecified: Secondary | ICD-10-CM

## 2019-10-30 DIAGNOSIS — I872 Venous insufficiency (chronic) (peripheral): Secondary | ICD-10-CM | POA: Insufficient documentation

## 2019-10-30 DIAGNOSIS — L97311 Non-pressure chronic ulcer of right ankle limited to breakdown of skin: Secondary | ICD-10-CM

## 2019-10-30 DIAGNOSIS — T148XXA Other injury of unspecified body region, initial encounter: Secondary | ICD-10-CM

## 2019-10-30 MED ORDER — AMOXICILLIN-POT CLAVULANATE 875-125 MG PO TABS: 1.0000 | ORAL_TABLET | Freq: Two times a day (BID) | ORAL | 0 refills | Status: AC

## 2019-10-30 NOTE — Progress Notes (Signed)
Subjective:  Patient ID: [name], female    DOB: May 20, 1956  Age: 64 y.o. MRN: [id number]  CC: Wound Infection  This visit occurred during the SARS-CoV-2 public health emergency.  Safety protocols were in place, including screening questions prior to the visit, additional usage of staff PPE, and extensive cleaning of exam room while observing appropriate contact time as indicated for disinfecting solutions.   NEW TO ME  HPI [name] presents for concerns about an area over her right posterior ankle. About 40 years ago she ruptured her right Achilles and has had 2 orthopedic surgeries in the area.  Since then she has had issues with the area but about 2 to 3 weeks ago she noticed a change in the area.  She said she has developed a wound with pain, redness, and swelling.  She has not noticed any bleeding or discharge.  Outpatient Medications Prior to Visit  Medication Sig Dispense Refill  . diclofenac (VOLTAREN) 75 MG EC tablet Take 1 tablet (75 mg total) by mouth 2 (two) times daily as needed. 180 tablet 3  . empagliflozin (JARDIANCE) 25 MG TABS tablet Take 25 mg by mouth daily before breakfast. 90 tablet 3  . glucose blood (ONE TOUCH ULTRA TEST) test strip 1 each by Other route 2 (two) times daily. Use to check blood sugars twice a day Dx E11.9 100 each 0  . ketoconazole (NIZORAL) 2 % cream Apply 1 fingertip amount to each foot daily. 30 g 0  . Lancets (ONETOUCH ULTRASOFT) lancets 1 each by Other route 2 (two) times daily. Use to check blood sugars twice a day Dx E11.9 100 each 0  . pravastatin (PRAVACHOL) 40 MG tablet Take 1 tablet (40 mg total) by mouth daily. 90 tablet 3  . tacrolimus (PROTOPIC) 0.1 % ointment Apply topically 2 (two) times daily. APPLY A THIN LAYER TO THE AFFECTED AREA(S) 2 TIMES A DAY RUB IN GENTLY AND COMPLETELY    . telmisartan-hydrochlorothiazide (MICARDIS HCT) 80-12.5 MG tablet Take 1 tablet by mouth daily. 90 tablet 3  . valACYclovir  (VALTREX) 500 MG tablet TAKE 1 TABLET BY MOUTH TWICE A DAY 60 tablet 5  . Vitamin D, Ergocalciferol, (DRISDOL) 1.25 MG (50000 UT) CAPS capsule Take 1 capsule (50,000 Units total) by mouth every 7 (seven) days. 12 capsule 0   No facility-administered medications prior to visit.    ROS Review of Systems  Constitutional: Negative for chills, fatigue and fever.  HENT: Negative.   Eyes: Negative for visual disturbance.  Respiratory: Negative for cough, chest tightness, shortness of breath and wheezing.   Cardiovascular: Negative for chest pain, palpitations and leg swelling.  Gastrointestinal: Negative for constipation, diarrhea, nausea and vomiting.  Endocrine: Negative.   Genitourinary: Negative.  Negative for difficulty urinating.  Musculoskeletal: Negative.   Skin: Positive for color change and wound.  Neurological: Negative.  Negative for dizziness.  Hematological: Negative.   Psychiatric/Behavioral: Negative.     Objective:  BP 130/84 (BP Location: Right Arm, Patient Position: Sitting, Cuff Size: Large)   Pulse 70   Temp 98.5 F (36.9 C) (Oral)   Resp 16   Ht 4\' 11"  (1.499 m)   Wt 255 lb (115.7 kg)   LMP 05/18/2016   SpO2 98%   BMI 51.50 kg/m   BP Readings from Last 3 Encounters:  10/30/19 130/84  10/06/19 136/82  06/06/19 132/84    Wt Readings from Last 3 Encounters:  10/30/19 255 lb (115.7 kg)  10/06/19  249 lb 3.2 oz (113 kg)  06/06/19 253 lb (114.8 kg)    Physical Exam Vitals reviewed.  Constitutional:      Appearance: She is obese.  HENT:     Nose: Nose normal.  Eyes:     General: No scleral icterus.    Conjunctiva/sclera: Conjunctivae normal.  Cardiovascular:     Rate and Rhythm: Normal rate and regular rhythm.     Heart sounds: No murmur.  Pulmonary:     Effort: Pulmonary effort is normal. No respiratory distress.     Breath sounds: No stridor. No wheezing, rhonchi or rales.  Abdominal:     General: Abdomen is protuberant. Bowel sounds are  normal.     Palpations: There is no hepatomegaly or splenomegaly.     Tenderness: There is no abdominal tenderness.  Musculoskeletal:        General: Normal range of motion.     Cervical back: Neck supple.       Legs:  Lymphadenopathy:     Cervical: No cervical adenopathy.  Neurological:     General: No focal deficit present.  Psychiatric:        Mood and Affect: Mood normal.        Behavior: Behavior normal.     Lab Results  Component Value Date   WBC 5.1 04/28/2019   HGB 11.6 (L) 04/28/2019   HCT 36.2 04/28/2019   PLT 213.0 04/28/2019   GLUCOSE 142 (H) 04/28/2019   CHOL 157 04/28/2019   TRIG 86.0 04/28/2019   HDL 61.90 04/28/2019   LDLDIRECT 76.5 02/17/2012   LDLCALC 78 04/28/2019   ALT 16 04/28/2019   AST 14 04/28/2019   NA 141 04/28/2019   K 3.8 04/28/2019   CL 105 04/28/2019   CREATININE 0.69 04/28/2019   BUN 16 04/28/2019   CO2 28 04/28/2019   TSH 1.05 04/28/2019   INR 1.02 07/15/2016   HGBA1C 7.3 (H) 10/24/2019   MICROALBUR 1.2 04/28/2019    CT Head Wo Contrast  Result Date: 06/07/2019 CLINICAL DATA:  Headache EXAM: CT HEAD WITHOUT CONTRAST TECHNIQUE: Contiguous axial images were obtained from the base of the skull through the vertex without intravenous contrast. COMPARISON:  None. FINDINGS: Brain: The ventricles are normal in size and configuration for age. There is borderline frontal atrophy bilaterally, symmetric. There is no intracranial mass, hemorrhage, extra-axial fluid collection, or midline shift. The brain parenchyma appears unremarkable. No acute infarct evident. Vascular: No hyperdense vessel. There is rather minimal calcification in the left carotid siphon. Skull: Bony calvarium appears intact. Sinuses/Orbits: Visualized paranasal sinuses are clear. Visualized orbits appear symmetric bilaterally. Other: Visualized mastoid air cells are clear. IMPRESSION: Borderline frontal atrophy bilaterally. Ventricles normal in size and configuration. Brain  parenchyma appears unremarkable. No mass or hemorrhage. Minimal vascular calcification. Electronically Signed   By: Lowella Grip III M.D.   On: 06/07/2019 13:29    Assessment & Plan:   [name] was seen today for wound infection.  Diagnoses and all orders for this visit:  Venous stasis ulcer of right ankle limited to breakdown of skin without varicose veins ([place])- I have referred her to the wound clinic to see what modalities can be done to help this heal. -     Ambulatory referral to Wound Clinic  Wound infection- This is likely a strep infection. I am also concerned about his gram-negative's or anaerobes.  I recommended that she treat this with a 10-day course of Augmentin. -     amoxicillin-clavulanate (AUGMENTIN) 875-125  MG tablet; Take 1 tablet by mouth 2 (two) times daily for 10 days.   I am having [name]. [name] start on amoxicillin-clavulanate. I am also having her maintain her glucose blood, onetouch ultrasoft, tacrolimus, ketoconazole, valACYclovir, telmisartan-hydrochlorothiazide, diclofenac, pravastatin, Vitamin D (Ergocalciferol), and Jardiance.  Meds ordered this encounter  Medications  . amoxicillin-clavulanate (AUGMENTIN) 875-125 MG tablet    Sig: Take 1 tablet by mouth 2 (two) times daily for 10 days.    Dispense:  20 tablet    Refill:  0     Follow-up: No follow-ups on file.  Scarlette Calico, MD

## 2019-10-30 NOTE — Patient Instructions (Signed)
Wound Infection °A wound infection happens when tiny organisms (microorganisms) start to grow in a wound. A wound infection is most often caused by bacteria. Infection can cause the wound to break open or worsen. Wound infection needs treatment. If a wound infection is left untreated, complications can occur. Untreated wound infections may lead to an infection in the bloodstream (septicemia) or a bone infection (osteomyelitis). °What are the causes? °This condition is most often caused by bacteria growing in a wound. Other microorganisms, like yeast and fungi, can also cause wound infections. °What increases the risk? °The following factors may make you more likely to develop this condition: °· Having a weak body defense system (immune system). °· Having diabetes. °· Taking steroid medicines for a long time (chronic use). °· Smoking. °· Being an older person. °· Being overweight. °· Taking chemotherapy medicines. °What are the signs or symptoms? °Symptoms of this condition include: °· Having more redness, swelling, or pain at the wound site. °· Having more blood or fluid at the wound site. °· A bad smell coming from a wound or bandage (dressing). °· Having a fever. °· Feeling tired or fatigued. °· Having warmth at or around the wound. °· Having pus at the wound site. °How is this diagnosed? °This condition is diagnosed with a medical history and physical exam. You may also have a wound culture or blood tests or both. °How is this treated? °This condition is usually treated with an antibiotic medicine. °· The infection should improve 24-48 hours after you start antibiotics. °· After 24-48 hours, redness around the wound should stop spreading, and the wound should be less painful. °Follow these instructions at home: °Medicines °· Take or apply over-the-counter and prescription medicines only as told by your health care provider. °· If you were prescribed an antibiotic medicine, take or apply it as told by your health  care provider. Do not stop using the antibiotic even if you start to feel better. °Wound care ° °· Clean the wound each day, or as told by your health care provider. °? Wash the wound with mild soap and water. °? Rinse the wound with water to remove all soap. °? Pat the wound dry with a clean towel. Do not rub it. °· Follow instructions from your health care provider about how to take care of your wound. Make sure you: °? Wash your hands with soap and water before and after you change your dressing. If soap and water are not available, use hand sanitizer. °? Change your dressing as told by your health care provider. °? Leave stitches (sutures), skin glue, or adhesive strips in place if your wound has been closed. These skin closures may need to stay in place for 2 weeks or longer. If adhesive strip edges start to loosen and curl up, you may trim the loose edges. Do not remove adhesive strips completely unless your health care provider tells you to do that. Some wounds are left open to heal on their own. °· Check your wound every day for signs of infection. Watch for: °? More redness, swelling, or pain. °? More fluid or blood. °? Warmth. °? Pus or a bad smell. °General instructions °· Keep the dressing dry until your health care provider says it can be removed. °· Do not take baths, swim, or use a hot tub until your health care provider approves. Ask your health care provider if you may take showers. You may only be allowed to take sponge baths. °· Raise (elevate)   the injured area above the level of your heart while you are sitting or lying down. °· Do not scratch or pick at the wound. °· Keep all follow-up visits as told by your health care provider. This is important. °Contact a health care provider if: °· Your pain is not controlled with medicine. °· You have more redness, swelling, or pain around your wound. °· You have more fluid or blood coming from your wound. °· Your wound feels warm to the touch. °· You have  pus coming from your wound. °· You continue to notice a bad smell coming from your wound or your dressing. °· Your wound that was closed breaks open. °Get help right away if: °· You have a red streak going away from your wound. °· You have a fever. °Summary °· A wound infection happens when tiny organisms (microorganisms) start to grow in a wound. °· This condition is usually treated with an antibiotic medicine. °· Follow instructions from your health care provider about how to take care of your wound. °· Contact a health care provider if your wound infection does not begin to improve in 24-48 hours, or your symptoms worsen. °· Keep all follow-up visits as told by your health care provider. This is important. °This information is not intended to replace advice given to you by your health care provider. Make sure you discuss any questions you have with your health care provider. °Document Revised: 05/17/2018 Document Reviewed: 05/17/2018 °Elsevier Patient Education © 2020 [employer] ° °

## 2019-10-31 ENCOUNTER — Ambulatory Visit: Admit: 2019-10-31 | Payer: 59 | Admitting: Surgery

## 2019-10-31 ENCOUNTER — Ambulatory Visit (HOSPITAL_COMMUNITY): Admission: RE | Admit: 2019-10-31 | Payer: 59 | Source: Ambulatory Visit

## 2019-10-31 SURGERY — MASTECTOMY WITH SENTINEL LYMPH NODE BIOPSY
Anesthesia: General | Site: Breast | Laterality: Left

## 2019-11-02 ENCOUNTER — Other Ambulatory Visit: Payer: Self-pay

## 2019-11-02 ENCOUNTER — Other Ambulatory Visit: Payer: Self-pay | Admitting: *Deleted

## 2019-11-02 ENCOUNTER — Encounter (HOSPITAL_BASED_OUTPATIENT_CLINIC_OR_DEPARTMENT_OTHER): Payer: 59 | Attending: Internal Medicine | Admitting: Internal Medicine

## 2019-11-02 ENCOUNTER — Ambulatory Visit: Payer: Self-pay | Admitting: Surgery

## 2019-11-02 DIAGNOSIS — Z885 Allergy status to narcotic agent status: Secondary | ICD-10-CM | POA: Insufficient documentation

## 2019-11-02 DIAGNOSIS — Z6841 Body Mass Index (BMI) 40.0 and over, adult: Secondary | ICD-10-CM | POA: Diagnosis not present

## 2019-11-02 DIAGNOSIS — I87311 Chronic venous hypertension (idiopathic) with ulcer of right lower extremity: Secondary | ICD-10-CM | POA: Diagnosis not present

## 2019-11-02 DIAGNOSIS — Z8542 Personal history of malignant neoplasm of other parts of uterus: Secondary | ICD-10-CM | POA: Diagnosis not present

## 2019-11-02 DIAGNOSIS — C50912 Malignant neoplasm of unspecified site of left female breast: Secondary | ICD-10-CM | POA: Insufficient documentation

## 2019-11-02 DIAGNOSIS — Z8249 Family history of ischemic heart disease and other diseases of the circulatory system: Secondary | ICD-10-CM | POA: Insufficient documentation

## 2019-11-02 DIAGNOSIS — I890 Lymphedema, not elsewhere classified: Secondary | ICD-10-CM | POA: Insufficient documentation

## 2019-11-02 DIAGNOSIS — I10 Essential (primary) hypertension: Secondary | ICD-10-CM | POA: Diagnosis not present

## 2019-11-02 DIAGNOSIS — Z90710 Acquired absence of both cervix and uterus: Secondary | ICD-10-CM | POA: Diagnosis not present

## 2019-11-02 DIAGNOSIS — M1990 Unspecified osteoarthritis, unspecified site: Secondary | ICD-10-CM | POA: Diagnosis not present

## 2019-11-02 DIAGNOSIS — L97312 Non-pressure chronic ulcer of right ankle with fat layer exposed: Secondary | ICD-10-CM | POA: Insufficient documentation

## 2019-11-02 DIAGNOSIS — Z96653 Presence of artificial knee joint, bilateral: Secondary | ICD-10-CM | POA: Diagnosis not present

## 2019-11-02 DIAGNOSIS — C50112 Malignant neoplasm of central portion of left female breast: Secondary | ICD-10-CM

## 2019-11-02 DIAGNOSIS — E1151 Type 2 diabetes mellitus with diabetic peripheral angiopathy without gangrene: Secondary | ICD-10-CM | POA: Insufficient documentation

## 2019-11-02 DIAGNOSIS — Z853 Personal history of malignant neoplasm of breast: Secondary | ICD-10-CM | POA: Insufficient documentation

## 2019-11-02 DIAGNOSIS — E11622 Type 2 diabetes mellitus with other skin ulcer: Secondary | ICD-10-CM | POA: Insufficient documentation

## 2019-11-02 DIAGNOSIS — I872 Venous insufficiency (chronic) (peripheral): Secondary | ICD-10-CM | POA: Diagnosis not present

## 2019-11-02 DIAGNOSIS — Z833 Family history of diabetes mellitus: Secondary | ICD-10-CM | POA: Diagnosis not present

## 2019-11-02 DIAGNOSIS — Z170 Estrogen receptor positive status [ER+]: Secondary | ICD-10-CM

## 2019-11-02 DIAGNOSIS — Z888 Allergy status to other drugs, medicaments and biological substances status: Secondary | ICD-10-CM | POA: Diagnosis not present

## 2019-11-02 NOTE — Progress Notes (Signed)
[name], [name] ([id number]) Visit Report for 11/02/2019 Allergy List Details Patient Name: Date of Service: [name], [name] 11/02/2019 2:45 PM Medical Record [id number] Patient Account Number: [id number] Date of Birth/Sex: Treating RN: 1956/06/12 (64 y.o. Female) Baruch Gouty Primary Care Audric Venn: Cathlean Cower Other Clinician: Referring Alline Pio: Treating Nekeisha Aure/Extender:Robson, Arta Bruce, Casper Harrison in Treatment: 0 Allergies Active Allergies morphine Reaction: nausea Severity: Moderate codeine Reaction: nausea Severity: Moderate Cymbalta Reaction: not tolerated Darvon Reaction: nausea hydrocodone Reaction: nausea oxycodone Reaction: nausea rosuvastatin Reaction: bone pain Allergy Notes Electronic Signature(s) Signed: 11/02/2019 6:38:38 PM By: Baruch Gouty RN, BSN Entered By: Baruch Gouty on 11/02/2019 15:25:05 -------------------------------------------------------------------------------- Arrival Information Details Patient Name: Date of Service: [name] 11/02/2019 2:45 PM Medical Record [id number] Patient Account Number: [id number] Date of Birth/Sex: Treating RN: 10-Mar-1956 (64 y.o. Female) Baruch Gouty Primary Care Jiyan Walkowski: Cathlean Cower Other Clinician: Referring Rusty Villella: Treating Aranda Bihm/Extender:Robson, Arta Bruce, Casper Harrison in Treatment: 0 Visit Information Patient Arrived: Ambulatory Arrival Time: 15:04 Accompanied By: self Transfer Assistance: None Patient Identification Verified: Yes Secondary Verification Process Completed: Yes Patient Requires Transmission-Based No Precautions: Patient Has Alerts: No Electronic Signature(s) Signed: 11/02/2019 6:38:38 PM By: Baruch Gouty RN, BSN Entered By: Baruch Gouty on 11/02/2019 15:10:32 -------------------------------------------------------------------------------- Clinic Level of Care Assessment Details Patient Name: Date of  Service: Jacqueline, Young 11/02/2019 2:45 PM Medical Record [id number] Patient Account Number: [id number] Date of Birth/Sex: Treating RN: Mar 04, 1956 (64 y.o. Female) Levan Hurst Primary Care Shunsuke Granzow: Cathlean Cower Other Clinician: Referring Khalid Lacko: Treating Kaaliyah Kita/Extender:Robson, Arta Bruce, Casper Harrison in Treatment: 0 Clinic Level of Care Assessment Items TOOL 1 Quantity Score X - Use when EandM and Procedure is performed on INITIAL visit 1 0 ASSESSMENTS - Nursing Assessment / Reassessment X - General Physical Exam (combine w/ comprehensive assessment (listed just below) 1 20 when performed on new pt. evals) X - Comprehensive Assessment (HX, ROS, Risk Assessments, Wounds Hx, etc.) 1 25 ASSESSMENTS - Wound and Skin Assessment / Reassessment []  - Dermatologic / Skin Assessment (not related to wound area) 0 ASSESSMENTS - Ostomy and/or Continence Assessment and Care []  - Incontinence Assessment and Management 0 []  - Ostomy Care Assessment and Management (repouching, etc.) 0 PROCESS - Coordination of Care X - Simple Patient / Family Education for ongoing care 1 15 []  - Complex (extensive) Patient / Family Education for ongoing care 0 X - Staff obtains Programmer, systems, Records, Test Results / Process Orders 1 10 []  - Staff telephones HHA, Nursing Homes / Clarify orders / etc 0 []  - Routine Transfer to another Facility (non-emergent condition) 0 []  - Routine Hospital Admission (non-emergent condition) 0 X - New Admissions / Biomedical engineer / Ordering NPWT, Apligraf, etc. 1 15 []  - Emergency Hospital Admission (emergent condition) 0 PROCESS - Special Needs []  - Pediatric / Minor Patient Management 0 []  - Isolation Patient Management 0 []  - Hearing / Language / Visual special needs 0 []  - Assessment of Community assistance (transportation, D/C planning, etc.) 0 []  - Additional assistance / Altered mentation 0 []  - Support Surface(s) Assessment (bed, cushion, seat,  etc.) 0 INTERVENTIONS - Miscellaneous []  - External ear exam 0 []  - Patient Transfer (multiple staff / Civil Service fast streamer / Similar devices) 0 []  - Simple Staple / Suture removal (25 or less) 0 []  - Complex Staple / Suture removal (26 or more) 0 []  - Hypo/Hyperglycemic Management (do not check if billed separately) 0 X - Ankle / Brachial Index (ABI) - do not check if billed separately 1 15 Has the patient been  seen at the hospital within the last three years: Yes Total Score: 100 Level Of Care: New/Established - Level 3 Electronic Signature(s) Signed: 11/02/2019 6:31:26 PM By: Levan Hurst RN, BSN Entered By: Levan Hurst on 11/02/2019 16:34:18 -------------------------------------------------------------------------------- Compression Therapy Details Patient Name: [name] Date of Service: 11/02/2019 2:45 PM Medical Record [id number] Patient Account Number: [id number] Date of Birth/Sex: 09/02/1956 (64 y.o. Female) Treating RN: Levan Hurst Primary Care Anina Schnake: Cathlean Cower Other Clinician: Referring Cerria Randhawa: Treating Brinlyn Cena/Extender:Robson, Arta Bruce, Casper Harrison in Treatment: 0 Compression Therapy Performed for Wound Wound #1 Right Achilles Assessment: Performed By: Clinician Levan Hurst, RN Compression Type: Three Layer Post Procedure Diagnosis Same as Pre-procedure Electronic Signature(s) Signed: 11/02/2019 6:31:26 PM By: Levan Hurst RN, BSN Entered By: Levan Hurst on 11/02/2019 16:35:06 -------------------------------------------------------------------------------- Encounter Discharge Information Details Patient Name: Date of Service: [name] 11/02/2019 2:45 PM Medical Record [id number] Patient Account Number: [id number] Date of Birth/Sex: Treating RN: November 17, 1955 (64 y.o. Female) Carlene Coria Primary Care Ramzi Brathwaite: Cathlean Cower Other Clinician: Referring Sathvik Tiedt: Treating Laurieann Friddle/Extender:Robson, Arta Bruce,  Casper Harrison in Treatment: 0 Encounter Discharge Information Items Discharge Condition: Stable Ambulatory Status: Ambulatory Discharge Destination: Home Transportation: Private Auto Accompanied By: self Schedule Follow-up Appointment: Yes Clinical Summary of Care: Patient Declined Electronic Signature(s) Signed: 11/02/2019 6:24:40 PM By: Carlene Coria RN Entered By: Carlene Coria on 11/02/2019 17:00:07 -------------------------------------------------------------------------------- Lower Extremity Assessment Details Patient Name: Date of Service: Jacqueline, Young 11/02/2019 2:45 PM Medical Record [id number] Patient Account Number: [id number] Date of Birth/Sex: Treating RN: Aug 07, 1956 (64 y.o. Female) Baruch Gouty Primary Care Ashunti Schofield: Cathlean Cower Other Clinician: Referring Zellie Jenning: Treating Ama Mcmaster/Extender:Robson, Arta Bruce, Casper Harrison in Treatment: 0 Edema Assessment Assessed: [Left: No] [Right: No] Edema: [Left: Ye] [Right: s] Calf Left: Right: Point of Measurement: cm From Medial Instep cm 43.5 cm Ankle Left: Right: Point of Measurement: cm From Medial Instep cm 26.8 cm Vascular Assessment Pulses: Dorsalis Pedis Palpable: [Right:Yes] Blood Pressure: Brachial: [Right:158] Ankle: [Right:Dorsalis Pedis: 172 1.09] Electronic Signature(s) Signed: 11/02/2019 6:38:38 PM By: Baruch Gouty RN, BSN Entered By: Baruch Gouty on 11/02/2019 15:50:33 -------------------------------------------------------------------------------- Multi Wound Chart Details Patient Name: Date of Service: [name] 11/02/2019 2:45 PM Medical Record [id number] Patient Account Number: [id number] Date of Birth/Sex: Treating RN: [birth year] (64 y.o. Female) Primary Care Mortimer Bair: Cathlean Cower Other Clinician: Referring Venkat Ankney: Treating Anni Hocevar/Extender:Robson, Arta Bruce, Casper Harrison in Treatment: 0 Vital Signs Height(in): 62 Pulse(bpm):  53 Weight(lbs): 250 Blood Pressure(mmHg): 158/97 Body Mass Index(BMI): 50 Temperature(F): 98.0 Respiratory 18 Rate(breaths/min): Photos: [1:No Photos] [N/A:N/A] Wound Location: [1:Right Achilles] [N/A:N/A] Wounding Event: [1:Trauma] [N/A:N/A] Primary Etiology: [1:Diabetic Wound/Ulcer of the N/A Lower Extremity] Comorbid History: [1:Anemia, Lymphedema, Sleep Apnea, Hypertension, Type II Diabetes, Osteoarthritis, Received Radiation] [N/A:N/A] Date Acquired: [1:10/16/2019] [N/A:N/A] Weeks of Treatment: [1:0] [N/A:N/A] Wound Status: [1:Open] [N/A:N/A] Measurements L x W x D 1.6x0.3x0.3 [N/A:N/A] (cm) Area (cm) : [1:0.377] [N/A:N/A] Volume (cm) : [1:0.113] [N/A:N/A] Classification: [1:Grade 1] [N/A:N/A] Exudate Amount: [1:None Present] [N/A:N/A] Wound Margin: [1:Thickened] [N/A:N/A] Granulation Amount: [1:Large (67-100%)] [N/A:N/A] Granulation Quality: [1:Pink, Pale] [N/A:N/A] Necrotic Amount: [1:Small (1-33%)] [N/A:N/A] Exposed Structures: [1:Fat Layer (Subcutaneous N/A Tissue) Exposed: Yes Fascia: No Tendon: No Muscle: No Joint: No Bone: No] Epithelialization: [1:Small (1-33%)] [N/A:N/A N/A] Treatment Notes Wound #1 (Right Achilles) 1. Cleanse With Wound Cleanser Soap and water 3. Primary Dressing Applied Iodoflex 4. Secondary Dressing Dry Gauze 6. Support Layer Applied 3 layer compression wrap Notes netting Electronic Signature(s) Signed: 11/02/2019 5:49:05 PM By: Linton Ham MD Entered By: Linton Ham on 11/02/2019 17:09:42 -------------------------------------------------------------------------------- Multi-Disciplinary  Care Plan Details Patient Name: Date of Service: Jacqueline, Young 11/02/2019 2:45 PM Medical Record [id number] Patient Account Number: [id number] Date of Birth/Sex: Treating RN: 10/16/1956 (64 y.o. Female) Levan Hurst Primary Care Justina Bertini: Cathlean Cower Other Clinician: Referring Salem Mastrogiovanni: Treating Lamara Brecht/Extender:Robson,  Arta Bruce, Casper Harrison in Treatment: 0 Active Inactive Nutrition Nursing Diagnoses: Imbalanced nutrition Potential for alteratiion in Nutrition/Potential for imbalanced nutrition Goals: Patient/caregiver agrees to and verbalizes understanding of need to use nutritional supplements and/or vitamins as prescribed Date Initiated: 11/02/2019 Target Resolution Date: 12/01/2019 Goal Status: Active Patient/caregiver will maintain therapeutic glucose control Date Initiated: 11/02/2019 Target Resolution Date: 12/01/2019 Goal Status: Active Interventions: Assess HgA1c results as ordered upon admission and as needed Assess patient nutrition upon admission and as needed per policy Provide education on elevated blood sugars and impact on wound healing Provide education on nutrition Notes: Wound/Skin Impairment Nursing Diagnoses: Impaired tissue integrity Knowledge deficit related to ulceration/compromised skin integrity Goals: Patient/caregiver will verbalize understanding of skin care regimen Date Initiated: 11/02/2019 Target Resolution Date: 12/01/2019 Goal Status: Active Ulcer/skin breakdown will have a volume reduction of 30% by week 4 Date Initiated: 11/02/2019 Target Resolution Date: 12/01/2019 Goal Status: Active Interventions: Assess patient/caregiver ability to obtain necessary supplies Assess patient/caregiver ability to perform ulcer/skin care regimen upon admission and as needed Assess ulceration(s) every visit Provide education on ulcer and skin care Notes: Electronic Signature(s) Signed: 11/02/2019 6:31:26 PM By: Levan Hurst RN, BSN Entered By: Levan Hurst on 11/02/2019 16:25:17 -------------------------------------------------------------------------------- Pain Assessment Details Patient Name: Date of Service: [name] 11/02/2019 2:45 PM Medical Record [id number] Patient Account Number: [id number] Date of Birth/Sex: Treating RN: Apr 20, 1956 (64  y.o. Female) Baruch Gouty Primary Care Kalon Erhardt: Cathlean Cower Other Clinician: Referring Roan Miklos: Treating Akirah Storck/Extender:Robson, Arta Bruce, Casper Harrison in Treatment: 0 Active Problems Location of Pain Severity and Description of Pain Patient Has Paino Yes Site Locations Pain Location: Pain in Ulcers With Dressing Change: Yes Rate the pain. Current Pain Level: 0 Worst Pain Level: 7 Least Pain Level: 0 Character of Pain Describe the Pain: Sharp, Shooting Pain Management and Medication Current Pain Management: Other: tolerable Is the Current Pain Management Adequate: Adequate How does your wound impact your activities of daily livingo Sleep: No Bathing: No Appetite: No Relationship With Others: No Bladder Continence: No Emotions: Yes Bowel Continence: No Work: No Toileting: No Drive: No Dressing: No Hobbies: No Electronic Signature(s) Signed: 11/02/2019 6:38:38 PM By: Baruch Gouty RN, BSN Entered By: Baruch Gouty on 11/02/2019 15:44:00 -------------------------------------------------------------------------------- Patient/Caregiver Education Details Laverle Patter 1/14/2021andnbsp2:45 Patient Name: Date of Service: V. PM Medical Record Patient Account Number: [id number] Number: Treating RN: Levan Hurst December 01, 1955 (63 y.o. Other Clinician: Date of Birth/Gender: Female) Treating Linton Ham Primary Care Physician/Extender: Cathlean Cower Physician: Suella Grove in Treatment: 0 Referring Physician: Cathlean Cower Education Assessment Education Provided To: Patient Education Topics Provided Elevated Blood Sugar/ Impact on Healing: Methods: Explain/Verbal Responses: State content correctly Nutrition: Methods: Explain/Verbal Responses: State content correctly Wound/Skin Impairment: Methods: Explain/Verbal Responses: State content correctly Electronic Signature(s) Signed: 11/02/2019 6:31:26 PM By: Levan Hurst RN, BSN Entered By:  Levan Hurst on 11/02/2019 16:25:36 -------------------------------------------------------------------------------- Wound Assessment Details Patient Name: Date of Service: [name] 11/02/2019 2:45 PM Medical Record [id number] Patient Account Number: [id number] Date of Birth/Sex: Treating RN: [birth year] (64 y.o. Female) Baruch Gouty Primary Care Annalysa Mohammad: Cathlean Cower Other Clinician: Referring Sahan Pen: Treating Freedom Peddy/Extender:Robson, Arta Bruce, Casper Harrison in Treatment: 0 Wound Status Wound Number: 1 Primary Diabetic Wound/Ulcer of the Lower Extremity Etiology: Wound Location: Right Achilles Wound  Open Wounding Event: Trauma Status: Date Acquired: 10/16/2019 Comorbid Anemia, Lymphedema, Sleep Apnea, Weeks Of Treatment: 0 History: Hypertension, Type II Diabetes, Osteoarthritis, Clustered Wound: No Received Radiation Wound Measurements Length: (cm) 1.6 Width: (cm) 0.3 Depth: (cm) 0.3 Area: (cm) 0.377 Volume: (cm) 0.113 Wound Description Classification: Grade 1 Wound Margin: Thickened Exudate Amount: None Present Wound Bed Granulation Amount: Large (67-100%) Granulation Quality: Pink, Pale Necrotic Amount: Small (1-33%) Necrotic Quality: Adherent Slough After Cleansing: No brino Yes Exposed Structure posed: No (Subcutaneous Tissue) Exposed: Yes posed: No posed: No osed: No sed: No % Reduction in Area: % Reduction in Volume: Epithelialization: Small (1-33%) Tunneling: No Undermining: No Foul Odor Slough/Fi Fascia Ex Fat Layer Tendon Ex Muscle Ex Joint Exp Bone Expo Treatment Notes Wound #1 (Right Achilles) 1. Cleanse With Wound Cleanser Soap and water 3. Primary Dressing Applied Iodoflex 4. Secondary Dressing Dry Gauze 6. Support Layer Applied 3 layer compression wrap Notes netting Electronic Signature(s) Signed: 11/02/2019 6:38:38 PM By: Baruch Gouty RN, BSN Entered By: Baruch Gouty on 11/02/2019  15:43:06 -------------------------------------------------------------------------------- [place] Details Patient Name: Date of Service: [name] 11/02/2019 2:45 PM Medical Record [id number] Patient Account Number: [id number] Date of Birth/Sex: Treating RN: [birth year] (64 y.o. Female) Baruch Gouty Primary Care Vern Prestia: Cathlean Cower Other Clinician: Referring Hermen Mario: Treating Shelsea Hangartner/Extender:Robson, Arta Bruce, Casper Harrison in Treatment: 0 Vital Signs Time Taken: 15:12 Temperature (F): 98.0 Height (in): 59 Pulse (bpm): 72 Source: Stated Respiratory Rate (breaths/min): 18 Weight (lbs): 250 Blood Pressure (mmHg): 158/97 Source: Stated Reference Range: 80 - 120 mg / dl Body Mass Index (BMI): 50.5 Notes does not check blood sugars at home on a regular basis Electronic Signature(s) Signed: 11/02/2019 6:38:38 PM By: Baruch Gouty RN, BSN Entered By: Baruch Gouty on 11/02/2019 15:14:23

## 2019-11-02 NOTE — Progress Notes (Signed)
[name] ([id number]) Visit Report for 11/02/2019 Abuse/Suicide Risk Screen Details Patient Name: Date of Service: [name], [name] 11/02/2019 2:45 PM Medical Record [id number] Patient Account Number: [id number] Date of Birth/Sex: Treating RN: 12/13/1955 (64 y.o. Female) Baruch Gouty Primary Care Oviya Ammar: Cathlean Cower Other Clinician: Referring Shakenna Herrero: Treating Daiel Strohecker/Extender:Robson, Arta Bruce, Casper Harrison in Treatment: 0 Abuse/Suicide Risk Screen Items Answer ABUSE RISK SCREEN: Has anyone close to you tried to hurt or harm you recentlyo No Do you feel uncomfortable with anyone in your familyo No Has anyone forced you do things that you didnt want to doo No Electronic Signature(s) Signed: 11/02/2019 6:38:38 PM By: Baruch Gouty RN, BSN Entered By: Baruch Gouty on 11/02/2019 15:37:38 -------------------------------------------------------------------------------- Activities of Daily Living Details Patient Name: Date of Service: [name] 11/02/2019 2:45 PM Medical Record [id number] Patient Account Number: [id number] Date of Birth/Sex: Treating RN: November 18, 1955 (64 y.o. Female) Baruch Gouty Primary Care Raylen Ken: Cathlean Cower Other Clinician: Referring Jayra Choyce: Treating Ahmari Duerson/Extender:Robson, Arta Bruce, Casper Harrison in Treatment: 0 Activities of Daily Living Items Answer Activities of Daily Living (Please select one for each item) Drive Automobile Completely Able Take Medications Completely Able Use Telephone Completely Able Care for Appearance Completely Able Use Toilet Completely Able Bath / Shower Completely Able Dress Self Completely Able Feed Self Completely Able Walk Completely Able Get In / Out Bed Completely Able Housework Completely Able Prepare Meals Completely Able Handle Money Completely Able Shop for Self Completely Able Electronic Signature(s) Signed: 11/02/2019 6:38:38 PM By: Baruch Gouty  RN, BSN Entered By: Baruch Gouty on 11/02/2019 15:37:58 -------------------------------------------------------------------------------- Education Screening Details Patient Name: Date of Service: [name] 11/02/2019 2:45 PM Medical Record [id number] Patient Account Number: [id number] Date of Birth/Sex: Treating RN: 1955/11/23 (63 y.o. Female) Baruch Gouty Primary Care Nakhi Choi: Cathlean Cower Other Clinician: Referring Adyan Palau: Treating Gannon Heinzman/Extender:Robson, Arta Bruce, Casper Harrison in Treatment: 0 Primary Learner Assessed: Patient Learning Preferences/Education Level/Primary Language Learning Preference: Explanation, Demonstration, Printed Material Highest Education Level: College or Above Preferred Language: English Cognitive Barrier Language Barrier: No Translator Needed: No Memory Deficit: No Emotional Barrier: No Cultural/Religious Beliefs Affecting Medical Care: No Physical Barrier Impaired Vision: Yes Glasses Impaired Hearing: No Decreased Hand dexterity: No Knowledge/Comprehension Knowledge Level: High Comprehension Level: High Ability to understand written High instructions: Ability to understand verbal High instructions: Motivation Anxiety Level: Calm Cooperation: Cooperative Education Importance: Acknowledges Need Interest in Health Problems: Asks Questions Perception: Coherent Willingness to Engage in Self- High Management Activities: Readiness to Engage in Self- High Management Activities: Electronic Signature(s) Signed: 11/02/2019 6:38:38 PM By: Baruch Gouty RN, BSN Entered By: Baruch Gouty on 11/02/2019 15:38:28 -------------------------------------------------------------------------------- Fall Risk Assessment Details Patient Name: Date of Service: [name] 11/02/2019 2:45 PM Medical Record [id number] Patient Account Number: [id number] Date of Birth/Sex: Treating RN: Oct 18, 1956 (65 y.o.  Female) Baruch Gouty Primary Care Gwynn Crossley: Cathlean Cower Other Clinician: Referring Annalysse Shoemaker: Treating Marasia Newhall/Extender:Robson, Arta Bruce, Casper Harrison in Treatment: 0 Fall Risk Assessment Items Have you had 2 or more falls in the last 12 monthso 0 No Have you had any fall that resulted in injury in the last 12 monthso 0 No FALLS RISK SCREEN History of falling - immediate or within 3 months 0 No Secondary diagnosis (Do you have 2 or more medical diagnoseso) 0 No Ambulatory aid None/bed rest/wheelchair/nurse 0 Yes Crutches/cane/walker 0 No Furniture 0 No Intravenous therapy Access/Saline/Heparin Lock 0 No Weak (short steps with or without shuffle, stooped but able to lift head 0 No while walking, may seek support from furniture)  Impaired (short steps with shuffle, may have difficulty arising from chair, 0 No head down, impaired balance) Mental Status Oriented to own ability 0 Yes Overestimates or forgets limitations 0 No Risk Level: Low Risk Score: 0 Electronic Signature(s) Signed: 11/02/2019 6:38:38 PM By: Baruch Gouty RN, BSN Entered By: Baruch Gouty on 11/02/2019 15:39:02 -------------------------------------------------------------------------------- Foot Assessment Details Patient Name: Date of Service: [name] 11/02/2019 2:45 PM Medical Record [id number] Patient Account Number: [id number] Date of Birth/Sex: Treating RN: 11/02/1955 (64 y.o. Female) Baruch Gouty Primary Care Tremar Wickens: Cathlean Cower Other Clinician: Referring Charliene Inoue: Treating Hollyann Pablo/Extender:Robson, Arta Bruce, Casper Harrison in Treatment: 0 Foot Assessment Items Site Locations + = Sensation present, - = Sensation absent, C = Callus, U = Ulcer R = Redness, W = Warmth, M = Maceration, PU = Pre-ulcerative lesion F = Fissure, S = Swelling, D = Dryness Assessment Right: Left: Other Deformity: No No Prior Foot Ulcer: No No Prior Amputation: No No Charcot Joint: No  No Ambulatory Status: Ambulatory Without Help Gait: Steady Electronic Signature(s) Signed: 11/02/2019 6:38:38 PM By: Baruch Gouty RN, BSN Entered By: Baruch Gouty on 11/02/2019 15:40:39 -------------------------------------------------------------------------------- Nutrition Risk Screening Details Patient Name: Date of Service: [name] 11/02/2019 2:45 PM Medical Record [id number] Patient Account Number: [id number] Date of Birth/Sex: Treating RN: Feb 26, 1956 (64 y.o. Female) Baruch Gouty Primary Care Cathlyn Tersigni: Cathlean Cower Other Clinician: Referring Jocilyn Trego: Treating Jamal Pavon/Extender:Robson, Arta Bruce, Casper Harrison in Treatment: 0 Height (in): 59 Weight (lbs): 250 Body Mass Index (BMI): 50.5 Nutrition Risk Screening Items Score Screening NUTRITION RISK SCREEN: I have an illness or condition that made me change the kind and/or 0 No amount of food I eat I eat fewer than two meals per day 0 No I eat few fruits and vegetables, or milk products 0 No I have three or more drinks of beer, liquor or wine almost every day 0 No I have tooth or mouth problems that make it hard for me to eat 0 No I don't always have enough money to buy the food I need 0 No I eat alone most of the time 1 Yes I take three or more different prescribed or over-the-counter drugs a day 1 Yes 2 Yes Without wanting to, I have lost or gained 10 pounds in the last six months I am not always physically able to shop, cook and/or feed myself 0 No Nutrition Protocols Good Risk Protocol Provide education on elevated blood sugars and Moderate Risk Protocol 0 impact on wound healing, as applicable High Risk Proctocol Risk Level: Moderate Risk Score: 4 Electronic Signature(s) Signed: 11/02/2019 6:38:38 PM By: Baruch Gouty RN, BSN Entered By: Baruch Gouty on 11/02/2019 15:39:30
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
## 2019-11-03 ENCOUNTER — Ambulatory Visit: Payer: Self-pay | Admitting: Surgery

## 2019-11-03 ENCOUNTER — Inpatient Hospital Stay: Payer: 59

## 2019-11-03 ENCOUNTER — Inpatient Hospital Stay: Payer: 59 | Attending: Oncology | Admitting: Oncology

## 2019-11-03 ENCOUNTER — Other Ambulatory Visit: Payer: Self-pay

## 2019-11-03 VITALS — BP 154/91 | HR 69 | Temp 97.8°F | Resp 18 | Ht 59.0 in | Wt 246.8 lb

## 2019-11-03 DIAGNOSIS — C50112 Malignant neoplasm of central portion of left female breast: Secondary | ICD-10-CM | POA: Diagnosis not present

## 2019-11-03 DIAGNOSIS — I10 Essential (primary) hypertension: Secondary | ICD-10-CM | POA: Diagnosis not present

## 2019-11-03 DIAGNOSIS — C50412 Malignant neoplasm of upper-outer quadrant of left female breast: Secondary | ICD-10-CM | POA: Diagnosis not present

## 2019-11-03 DIAGNOSIS — Z8542 Personal history of malignant neoplasm of other parts of uterus: Secondary | ICD-10-CM | POA: Insufficient documentation

## 2019-11-03 DIAGNOSIS — Z801 Family history of malignant neoplasm of trachea, bronchus and lung: Secondary | ICD-10-CM | POA: Diagnosis not present

## 2019-11-03 DIAGNOSIS — Z803 Family history of malignant neoplasm of breast: Secondary | ICD-10-CM | POA: Insufficient documentation

## 2019-11-03 DIAGNOSIS — Z6841 Body Mass Index (BMI) 40.0 and over, adult: Secondary | ICD-10-CM | POA: Diagnosis not present

## 2019-11-03 DIAGNOSIS — E119 Type 2 diabetes mellitus without complications: Secondary | ICD-10-CM

## 2019-11-03 DIAGNOSIS — Z8249 Family history of ischemic heart disease and other diseases of the circulatory system: Secondary | ICD-10-CM | POA: Insufficient documentation

## 2019-11-03 DIAGNOSIS — Z853 Personal history of malignant neoplasm of breast: Secondary | ICD-10-CM | POA: Insufficient documentation

## 2019-11-03 DIAGNOSIS — C50812 Malignant neoplasm of overlapping sites of left female breast: Secondary | ICD-10-CM | POA: Insufficient documentation

## 2019-11-03 DIAGNOSIS — Z90710 Acquired absence of both cervix and uterus: Secondary | ICD-10-CM | POA: Insufficient documentation

## 2019-11-03 DIAGNOSIS — Z171 Estrogen receptor negative status [ER-]: Secondary | ICD-10-CM | POA: Diagnosis not present

## 2019-11-03 DIAGNOSIS — Z90722 Acquired absence of ovaries, bilateral: Secondary | ICD-10-CM | POA: Diagnosis not present

## 2019-11-03 DIAGNOSIS — Z9079 Acquired absence of other genital organ(s): Secondary | ICD-10-CM | POA: Insufficient documentation

## 2019-11-03 DIAGNOSIS — Z79899 Other long term (current) drug therapy: Secondary | ICD-10-CM | POA: Diagnosis not present

## 2019-11-03 DIAGNOSIS — Z923 Personal history of irradiation: Secondary | ICD-10-CM | POA: Insufficient documentation

## 2019-11-03 DIAGNOSIS — C50912 Malignant neoplasm of unspecified site of left female breast: Secondary | ICD-10-CM | POA: Diagnosis not present

## 2019-11-03 DIAGNOSIS — Z8349 Family history of other endocrine, nutritional and metabolic diseases: Secondary | ICD-10-CM | POA: Diagnosis not present

## 2019-11-03 DIAGNOSIS — E1142 Type 2 diabetes mellitus with diabetic polyneuropathy: Secondary | ICD-10-CM

## 2019-11-03 DIAGNOSIS — L97311 Non-pressure chronic ulcer of right ankle limited to breakdown of skin: Secondary | ICD-10-CM

## 2019-11-03 DIAGNOSIS — I872 Venous insufficiency (chronic) (peripheral): Secondary | ICD-10-CM

## 2019-11-03 DIAGNOSIS — Z170 Estrogen receptor positive status [ER+]: Secondary | ICD-10-CM

## 2019-11-03 LAB — CMP (CANCER CENTER ONLY)
ALT: 18 U/L (ref 0–44)
AST: 15 U/L (ref 15–41)
Albumin: 3.4 g/dL — ABNORMAL LOW (ref 3.5–5.0)
Alkaline Phosphatase: 55 U/L (ref 38–126)
Anion gap: 9 (ref 5–15)
BUN: 17 mg/dL (ref 8–23)
CO2: 25 mmol/L (ref 22–32)
Calcium: 9.2 mg/dL (ref 8.9–10.3)
Chloride: 107 mmol/L (ref 98–111)
Creatinine: 0.83 mg/dL (ref 0.44–1.00)
GFR, Est AFR Am: >60 mL/min (ref >60)
GFR, Estimated: >60 mL/min (ref >60)
Glucose, Bld: 138 mg/dL — ABNORMAL HIGH (ref 70–99)
Potassium: 4.1 mmol/L (ref 3.5–5.1)
Sodium: 141 mmol/L (ref 135–145)
Total Bilirubin: 0.3 mg/dL (ref 0.3–1.2)
Total Protein: 8.7 g/dL — ABNORMAL HIGH (ref 6.5–8.1)

## 2019-11-03 LAB — CBC WITH DIFFERENTIAL (CANCER CENTER ONLY)
Abs Immature Granulocytes: 0.02 10*3/uL (ref 0.00–0.07)
Basophils Absolute: 0 10*3/uL (ref 0.0–0.1)
Basophils Relative: 1 %
Eosinophils Absolute: 0.1 10*3/uL (ref 0.0–0.5)
Eosinophils Relative: 1 %
HCT: 41.3 % (ref 36.0–46.0)
Hemoglobin: 12.7 g/dL (ref 12.0–15.0)
Immature Granulocytes: 0 %
Lymphocytes Relative: 22 %
Lymphs Abs: 1.5 10*3/uL (ref 0.7–4.0)
MCH: 26.9 pg (ref 26.0–34.0)
MCHC: 30.8 g/dL (ref 30.0–36.0)
MCV: 87.5 fL (ref 80.0–100.0)
Monocytes Absolute: 0.6 10*3/uL (ref 0.1–1.0)
Monocytes Relative: 9 %
Neutro Abs: 4.4 10*3/uL (ref 1.7–7.7)
Neutrophils Relative %: 67 %
Platelet Count: 284 10*3/uL (ref 150–400)
RBC: 4.72 MIL/uL (ref 3.87–5.11)
RDW: 15.1 % (ref 11.5–15.5)
WBC Count: 6.6 10*3/uL (ref 4.0–10.5)
nRBC: 0 % (ref 0.0–0.2)

## 2019-11-03 MED ORDER — PROCHLORPERAZINE MALEATE 10 MG PO TABS: 10.0000 mg | ORAL_TABLET | Freq: Four times a day (QID) | ORAL | 1 refills | Status: DC | PRN

## 2019-11-03 MED ORDER — LIDOCAINE-PRILOCAINE 2.5-2.5 % EX CREA: TOPICAL_CREAM | CUTANEOUS | 3 refills | Status: DC

## 2019-11-03 MED ORDER — LORATADINE 10 MG PO TABS: 10.0000 mg | ORAL_TABLET | Freq: Every day | ORAL | 0 refills | Status: DC

## 2019-11-03 MED ORDER — LORAZEPAM 0.5 MG PO TABS: 0.5000 mg | ORAL_TABLET | Freq: Every evening | ORAL | 0 refills | Status: DC | PRN

## 2019-11-03 MED ORDER — DEXAMETHASONE 4 MG PO TABS: ORAL_TABLET | ORAL | 1 refills | Status: DC

## 2019-11-03 NOTE — Progress Notes (Signed)
START ON PATHWAY REGIMEN - Breast     A cycle is every 14 days (cycles 1-4):     Doxorubicin      Cyclophosphamide      Pegfilgrastim-xxxx    A cycle is every 21 days (cycles 5-8):     Paclitaxel      Carboplatin   **Always confirm dose/schedule in your pharmacy ordering system**  Patient Characteristics: Preoperative or Nonsurgical Candidate (Clinical Staging), Neoadjuvant Therapy followed by Surgery, Invasive Disease, Chemotherapy, HER2 Negative/Unknown/Equivocal, ER Negative/Unknown, Platinum Therapy Indicated Therapeutic Status: Preoperative or Nonsurgical Candidate (Clinical Staging) AJCC M Category: cM0 AJCC Grade: G3 Breast Surgical Plan: Neoadjuvant Therapy followed by Surgery ER Status: Negative (-) AJCC 8 Stage Grouping: IIB HER2 Status: Negative (-) AJCC T Category: cT1 AJCC N Category: cN1 PR Status: Negative (-) Type of Therapy: Platinum Therapy Indicated Intent of Therapy: Curative Intent, Discussed with Patient

## 2019-11-03 NOTE — Progress Notes (Signed)
SADIEE, KELLEN ([id number]) Visit Report for 11/02/2019 HPI Details Patient Name: [name], [name]. Date of Service: 11/02/2019 2:45 PM Medical Record [id number] Patient Account Number: [id number] Date of Birth/Sex: Treating RN: [birth year] (64 y.o. F) Primary Care Provider: Cathlean Cower Other Clinician: Referring Provider: Treating Provider/Extender:Damascus Feldpausch, Arta Bruce, Casper Harrison in Treatment: 0 History of Present Illness HPI Description: 11/02/2019 ADMISSION This is a 64 year old woman who has a wound on the right posterior Achilles area towards the distal end of the Achilles tendon surgery scars. She tells me that roughly 2-1/2 to 3 weeks ago she was picking at some dry skin in the area pulled it off and that is how the wound opened up. She had an original spontaneous Achilles tendon rupture in the early 1990s that was repaired and then very shortly thereafter it reruptured and she required another surgery. She has not had any problems with this since then. The wound itself currently has episodic sharp pain some swelling. She was put on Augmentin by her primary physician on 10/30/2019 for possible coexistent cellulitis. She used hydrogen peroxide for a while with topical antibiotics. Mostly she has been using topical antibiotics as somebody told her that hydrogen peroxide was not good to use. 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 Surgery was in [place], bilateral total knee replacements. ABIs in our clinic on the right were 1.09 Electronic Signature(s) Signed: 11/02/2019 5:49:05 PM By: Linton Ham MD Entered By: Linton Ham on 11/02/2019  17:13:09 -------------------------------------------------------------------------------- Physical Exam Details Patient Name: [name] Date of Service: 11/02/2019 2:45 PM Medical Record [id number] Patient Account Number: [id number] Date of Birth/Sex: Treating RN: 03-30-1956 (64 y.o. F) Primary Care Provider: Cathlean Cower Other Clinician: Referring Provider: Treating Provider/Extender:Marcedes Tech, Arta Bruce, Casper Harrison in Treatment: 0 Constitutional Patient is hypertensive.. Pulse regular and within target range for patient.[name] Respirations regular, non-labored and within target range.. Temperature is normal and within the target range for the patient.[name] Appears in no distress. Eyes Conjunctivae clear. No discharge.no icterus. Cardiovascular Pedal pulses are palpable on the right.. Edema present in both extremities. Most problematically just above the Achilles tendon site. There are some dilated small veins. Integumentary (Hair, Skin) There is erythema that is nontender around the wound on the posterior Achilles. I wonder if this is chronic venous changes. Neurological She seems to have intact vibration and light touch. Notes Wound exam; the patient has a small linear slit in the lower part of her Achilles tendon insertion. The base of this has some debris but because of the wound being so narrow I did not attempt debridement today. I did not see obvious evidence of infection there is some dark skin around the wound although I wonder if this is chronic venous insufficiency changes. There was no tenderness. No purulent drainage. No cultures were done Electronic Signature(s) Signed: 11/02/2019 5:49:05 PM By: Linton Ham MD Entered By: Linton Ham on 11/02/2019 17:17:01 -------------------------------------------------------------------------------- Physician Orders Details Patient Name: [name] Date of Service: 11/02/2019 2:45 PM Medical Record  [id number] Patient Account Number: [id number] Date of Birth/Sex: Treating RN: 06-11-1956 (64 y.o. Nancy Fetter Primary Care Provider: Cathlean Cower Other Clinician: Referring Provider: Treating Provider/Extender:Lory Nowaczyk, Arta Bruce, Casper Harrison in Treatment: 0 Verbal / Phone Orders: No Diagnosis  Coding Follow-up Appointments Return Appointment in 1 week. Dressing Change Frequency Wound #1 Right Achilles Do not change entire dressing for one week. Skin Barriers/Peri-Wound Care Wound #1 Right Achilles Moisturizing lotion Wound Cleansing Wound #1 Right Achilles May shower with protection. - May use cast protector Primary Wound Dressing Wound #1 Right Achilles Iodoflex Secondary Dressing Wound #1 Right Achilles Dry Gauze ABD pad Edema Control 3 Layer Compression System - Right Lower Extremity Avoid standing for long periods of time Elevate legs to the level of the heart or above for 30 minutes daily and/or when sitting, a frequency of: - throughout the day Electronic Signature(s) Signed: 11/02/2019 5:49:05 PM By: Linton Ham MD Signed: 11/02/2019 6:31:26 PM By: Levan Hurst RN, BSN Entered By: Levan Hurst on 11/02/2019 16:32:19 -------------------------------------------------------------------------------- Problem List Details Patient Name: [name] Date of Service: 11/02/2019 2:45 PM Medical Record [id number] Patient Account Number: [id number] Date of Birth/Sex: Treating RN: 1956-05-30 (64 y.o. F) Primary Care Provider: Cathlean Cower Other Clinician: Referring Provider: Treating Provider/Extender:Celena Lanius, Arta Bruce, Casper Harrison in Treatment: 0 Active Problems ICD-10 Evaluated Encounter Code Description Active Date Today Diagnosis L97.312 Non-pressure chronic ulcer of right ankle with fat layer 11/02/2019 No Yes exposed T81.31XD Disruption of external operation (surgical) wound, not 11/02/2019 No Yes elsewhere classified,  subsequent encounter I87.311 Chronic venous hypertension (idiopathic) with ulcer of 11/02/2019 No Yes right lower extremity E11.622 Type 2 diabetes mellitus with other skin ulcer 11/02/2019 No Yes Inactive Problems Resolved Problems Electronic Signature(s) Signed: 11/02/2019 5:49:05 PM By: Linton Ham MD Entered By: Linton Ham on 11/02/2019 16:37:28 -------------------------------------------------------------------------------- Progress Note Details Patient Name: [name] Date of Service: 11/02/2019 2:45 PM Medical Record [id number] Patient Account Number: [id number] Date of Birth/Sex: Treating RN: [birth year] (64 y.o. F) Primary Care Provider: Cathlean Cower Other Clinician: Referring Provider: Treating Provider/Extender:Aasir Daigler, Arta Bruce, Casper Harrison in Treatment: 0 Subjective History of Present Illness (HPI) 11/02/2019 ADMISSION This is a 64 year old woman who has a wound on the right posterior Achilles area towards the distal end of the Achilles tendon surgery scars. She tells me that roughly 2-1/2 to 3 weeks ago she was picking at some dry skin in the area pulled it off and that is how the wound opened up. She had an original spontaneous Achilles tendon rupture in the early 1990s that was repaired and then very shortly thereafter it reruptured and she required another surgery. She has not had any problems with this since then. The wound itself currently has episodic sharp pain some swelling. She was put on Augmentin by her primary physician on 10/30/2019 for possible coexistent cellulitis. She used hydrogen peroxide for a while with topical antibiotics. Mostly she has been using topical antibiotics as somebody told her that hydrogen peroxide was not good to use. Past medical history; type 2 diabetes recent hemoglobin A1c of 7.3, she has recurrent breast cancer in the left breast apparently with metastasis this is been found out recently she has a history  of endometrial CA status post hysterectomy, history of ruptured Achilles tendons twice on the right than once on the left. Surgery was in [place], bilateral total knee replacements. ABIs in our clinic on the right were 1.09 Patient History Information obtained from Patient. Allergies morphine (Severity: Moderate, Reaction: nausea), codeine (Severity: Moderate, Reaction: nausea), Cymbalta (Reaction: not tolerated), Darvon (Reaction: nausea), hydrocodone (Reaction: nausea), oxycodone (Reaction: nausea), rosuvastatin (Reaction: bone pain) Family History Diabetes - Paternal Grandparents,Mother,Father, Heart Disease - Father, Hypertension - Mother,Father, No family history of Cancer, Hereditary Spherocytosis, Kidney Disease,  Lung Disease, Seizures, Stroke, Thyroid Problems, Tuberculosis. 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 Medical History Hematologic/Lymphatic Patient has history of Anemia, Lymphedema Respiratory Patient has history of Sleep Apnea - no CPAP Cardiovascular Patient has history of Hypertension Endocrine Patient has history of Type II Diabetes Genitourinary Denies history of End Stage Renal Disease Integumentary (Skin) Denies history of History of Burn Musculoskeletal Patient has history of Osteoarthritis Oncologic Patient has history of Received Radiation Denies history of Received Chemotherapy Psychiatric Denies history of Anorexia/bulimia, Confinement Anxiety Patient is treated with Oral Agents. Blood sugar is not tested. Hospitalization/Surgery History - bilk knee replacements. - hysterectomy. - left breast lumpectomy. - left achilles repair. - right achilles repair. - c-section. Medical And Surgical History Notes Constitutional Symptoms (General Health) morbid obesity Genitourinary urinary incontinence Oncologic hx left breast CA with recurring  metastatic disease, hx uterine CA Review of Systems (ROS) Constitutional Symptoms (General Health) Denies complaints or symptoms of Fatigue, Fever, Chills, Marked Weight Change. Eyes Complains or has symptoms of Glasses / Contacts. Denies complaints or symptoms of Dry Eyes, Vision Changes. Ear/Nose/Mouth/Throat Denies complaints or symptoms of Chronic sinus problems or rhinitis. Respiratory Denies complaints or symptoms of Chronic or frequent coughs, Shortness of Breath. Cardiovascular Denies complaints or symptoms of Chest pain. Gastrointestinal Denies complaints or symptoms of Frequent diarrhea, Nausea, Vomiting. Genitourinary Complains or has symptoms of Frequent urination - urgency. Integumentary (Skin) Complains or has symptoms of Wounds - right achilles. Musculoskeletal Complains or has symptoms of Muscle Weakness. Neurologic Denies complaints or symptoms of Numbness/parasthesias. Psychiatric Denies complaints or symptoms of Claustrophobia, Suicidal. Objective Constitutional Patient is hypertensive.. Pulse regular and within target range for patient.[name] Respirations regular, non-labored and within target range.. Temperature is normal and within the target range for the patient.[name] Appears in no distress. Vitals Time Taken: 3:12 PM, Height: 59 in, Source: Stated, Weight: 250 lbs, Source: Stated, BMI: 50.5, Temperature: 98.0 F, Pulse: 72 bpm, Respiratory Rate: 18 breaths/min, Blood Pressure: 158/97 mmHg. General Notes: does not check blood sugars at home on a regular basis Eyes Conjunctivae clear. No discharge.no icterus. Cardiovascular Pedal pulses are palpable on the right.. Edema present in both extremities. Most problematically just above the Achilles tendon site. There are some dilated small veins. Neurological She seems to have intact vibration and light touch. General Notes: Wound exam; the patient has a small linear slit in the lower part of her Achilles tendon  insertion. The base of this has some debris but because of the wound being so narrow I did not attempt debridement today. I did not see obvious evidence of infection there is some dark skin around the wound although I wonder if this is chronic venous insufficiency changes. There was no tenderness. No purulent drainage. No cultures were done Integumentary (Hair, Skin) There is erythema that is nontender around the wound on the posterior Achilles. I wonder if this is chronic venous changes. Wound #1 status is Open. Original cause of wound was Trauma. The wound is located on the Right Achilles. The wound measures 1.6cm length x 0.3cm width x 0.3cm depth; 0.377cm^2 area and 0.113cm^3 volume. There is Fat Layer (Subcutaneous Tissue) Exposed exposed. There is no tunneling or undermining noted. There is a none present amount of drainage noted. The wound margin is thickened. There is large (67-100%) pink, pale granulation within the wound bed. There is a small (1-33%) amount of necrotic tissue within the  wound bed including Adherent Slough. Assessment Active Problems ICD-10 Non-pressure chronic ulcer of right ankle with fat layer exposed Disruption of external operation (surgical) wound, not elsewhere classified, subsequent encounter Chronic venous hypertension (idiopathic) with ulcer of right lower extremity Type 2 diabetes mellitus with other skin ulcer Procedures Wound #1 Pre-procedure diagnosis of Wound #1 is a Diabetic Wound/Ulcer of the Lower Extremity located on the Right Achilles . There was a Three Layer Compression Therapy Procedure by Levan Hurst, RN. Post procedure Diagnosis Wound #1: Same as Pre-Procedure Plan Follow-up Appointments: Return Appointment in 1 week. Dressing Change Frequency: Wound #1 Right Achilles: Do not change entire dressing for one week. Skin Barriers/Peri-Wound Care: Wound #1 Right Achilles: Moisturizing lotion Wound Cleansing: Wound #1 Right  Achilles: May shower with protection. - May use cast protector Primary Wound Dressing: Wound #1 Right Achilles: Iodoflex Secondary Dressing: Wound #1 Right Achilles: Dry Gauze ABD pad Edema Control: 3 Layer Compression System - Right Lower Extremity Avoid standing for long periods of time Elevate legs to the level of the heart or above for 30 minutes daily and/or when sitting, a frequency of: - throughout the day 1. The patient has a small vertically orientated wound with very little width. Under illumination the surface of the wound is not that viable looking. There is also edema around the wound. 2. I put Iodoflex in the wound to see if I can clean out the wound base. Sometimes this will pull tissue together as well 3. I am going to put her in compression in order to try and get swelling out from around this area 4. As usual wounds and surgical scars are very difficult to close. the wound circumference has nonvibrant rolled edges. It does not have the appearance of an acute wound. 5. I do not believe there is an arterial issue but I am convinced there is probably some degree of chronic venous insufficiency based on changes around the wound and also physical examination of the left leg I spent 35 minutes in preparation of this note including researching [facility] [place] [facility] link, physical examination and note preparation Electronic Signature(s) Signed: 11/02/2019 5:49:05 PM By: Linton Ham MD Entered By: Linton Ham on 11/02/2019 17:20:07 -------------------------------------------------------------------------------- HxROS Details Patient Name: [name] Date of Service: 11/02/2019 2:45 PM Medical Record [id number] Patient Account Number: [id number] Date of Birth/Sex: Treating RN: 30-May-1956 (63 y.o. Elam Dutch Primary Care Provider: Cathlean Cower Other Clinician: Referring Provider: Treating Provider/Extender:Kaitlin Alcindor, Arta Bruce, Casper Harrison in  Treatment: 0 Information Obtained From Patient Constitutional Symptoms (General Health) Complaints and Symptoms: Negative for: Fatigue; Fever; Chills; Marked Weight Change Medical History: Past Medical History Notes: morbid obesity Eyes Complaints and Symptoms: Positive for: Glasses / Contacts Negative for: Dry Eyes; Vision Changes Ear/Nose/Mouth/Throat Complaints and Symptoms: Negative for: Chronic sinus problems or rhinitis Respiratory Complaints and Symptoms: Negative for: Chronic or frequent coughs; Shortness of Breath Medical History: Positive for: Sleep Apnea - no CPAP Cardiovascular Complaints and Symptoms: Negative for: Chest pain Medical History: Positive for: Hypertension Gastrointestinal Complaints and Symptoms: Negative for: Frequent diarrhea; Nausea; Vomiting Genitourinary Complaints and Symptoms: Positive for: Frequent urination - urgency Medical History: Negative for: End Stage Renal Disease Past Medical History Notes: urinary incontinence Integumentary (Skin) Complaints and Symptoms: Positive for: Wounds - right achilles Medical History: Negative for: History of Burn Musculoskeletal Complaints and Symptoms: Positive for: Muscle Weakness Medical History: Positive for: Osteoarthritis Neurologic Complaints and Symptoms: Negative for: Numbness/parasthesias Psychiatric Complaints and Symptoms: Negative for: Claustrophobia; Suicidal Medical History: Negative for:  Anorexia/bulimia; Confinement Anxiety Hematologic/Lymphatic Medical History: Positive for: Anemia; Lymphedema Endocrine Medical History: Positive for: Type II Diabetes Time with diabetes: 5 yrs Treated with: Oral agents Blood sugar tested every day: No Immunological Oncologic Medical History: Positive for: Received Radiation Negative for: Received Chemotherapy Past Medical History Notes: hx left breast CA with recurring metastatic disease, hx uterine  CA Immunizations Pneumococcal Vaccine: Received Pneumococcal Vaccination: Yes Implantable Devices Yes Hospitalization / Surgery History Type of Hospitalization/Surgery bilk knee replacements hysterectomy left breast lumpectomy left achilles repair right achilles repair c-section Family and Social History Cancer: No; Diabetes: Yes - Paternal Grandparents,Mother,Father; Heart Disease: Yes - Father; Hereditary Spherocytosis: No; Hypertension: Yes - Mother,Father; Kidney Disease: No; Lung Disease: No; Seizures: No; Stroke: No; Thyroid Problems: No; Tuberculosis: No; Never smoker; Marital Status - Divorced; Alcohol Use: Rarely; Drug Use: Prior History - TCH; Caffeine Use: Daily - tea; Financial Concerns: No; Food, Clothing or Shelter Needs: No; Support System Lacking: No; Transportation Concerns: Yes - will have multiple appointments for breast CA Electronic Signature(s) Signed: 11/02/2019 5:49:05 PM By: Linton Ham MD Signed: 11/02/2019 6:38:38 PM By: Baruch Gouty RN, BSN Entered By: Baruch Gouty on 11/02/2019 15:37:30 -------------------------------------------------------------------------------- [place] Details Patient Name: Date of Service: [name] 11/02/2019 Medical Record [id number] Patient Account Number: [id number] Date of Birth/Sex: Treating RN: 10/30/1955 (64 y.o. F) Primary Care Provider: Cathlean Cower Other Clinician: Referring Provider: Treating Provider/Extender:Givanni Staron, Arta Bruce, Casper Harrison in Treatment: 0 Diagnosis Coding ICD-10 Codes Code Description [phone] Non-pressure chronic ulcer of right ankle with fat layer exposed Disruption of external operation (surgical) wound, not elsewhere classified, subsequent T81.31XD encounter I87.311 Chronic venous hypertension (idiopathic) with ulcer of right lower extremity E11.622 Type 2 diabetes mellitus with other skin ulcer Facility Procedures CPT4 Code Description: [id number] -  WOUND CARE VISIT-LEV 3 EST PT Modifier: 25 Quantity: 1 CPT4 Code Description: [id number] (Facility Use Only) [phone] - [place] LWR RT LEG Modifier: Quantity: 1 Physician Procedures CPT4 Code Description: [id number] WC PHYS LEVEL 3 NEW PT ICD-10 Diagnosis Description [id number] Non-pressure chronic ulcer of right ankle with fat la E11.622 Type 2 diabetes mellitus with other skin ulcer I87.311 Chronic venous hypertension (idiopathic) with  ulcer o Modifier: yer exposed f right lower ext Quantity: 1 remity Electronic Signature(s) Signed: 11/02/2019 6:31:26 PM By: Levan Hurst RN, BSN Signed: 11/03/2019 6:20:19 PM By: Linton Ham MD Previous Signature: 11/02/2019 5:49:05 PM Version By: Linton Ham MD Entered By: Levan Hurst on 11/02/2019 18:30:02

## 2019-11-06 ENCOUNTER — Other Ambulatory Visit: Payer: Self-pay | Admitting: Oncology

## 2019-11-06 ENCOUNTER — Encounter: Payer: Self-pay | Admitting: *Deleted

## 2019-11-06 ENCOUNTER — Telehealth: Payer: Self-pay | Admitting: Oncology

## 2019-11-06 DIAGNOSIS — Z171 Estrogen receptor negative status [ER-]: Secondary | ICD-10-CM

## 2019-11-06 DIAGNOSIS — C50112 Malignant neoplasm of central portion of left female breast: Secondary | ICD-10-CM

## 2019-11-06 DIAGNOSIS — C50812 Malignant neoplasm of overlapping sites of left female breast: Secondary | ICD-10-CM

## 2019-11-06 MED ORDER — LIDOCAINE-PRILOCAINE 2.5-2.5 % EX CREA: TOPICAL_CREAM | CUTANEOUS | 3 refills | Status: DC

## 2019-11-06 MED ORDER — DEXAMETHASONE 4 MG PO TABS: 8.0000 mg | ORAL_TABLET | Freq: Two times a day (BID) | ORAL | 1 refills | Status: DC

## 2019-11-06 MED ORDER — LORAZEPAM 0.5 MG PO TABS: 0.5000 mg | ORAL_TABLET | Freq: Every evening | ORAL | 0 refills | Status: DC | PRN

## 2019-11-06 MED ORDER — PROCHLORPERAZINE MALEATE 10 MG PO TABS: 10.0000 mg | ORAL_TABLET | Freq: Four times a day (QID) | ORAL | 1 refills | Status: DC | PRN

## 2019-11-06 MED ORDER — LORATADINE 10 MG PO TABS: 10.0000 mg | ORAL_TABLET | Freq: Every day | ORAL | 1 refills | Status: DC

## 2019-11-06 NOTE — Telephone Encounter (Signed)
I talk with patient regarding schedule  

## 2019-11-06 NOTE — Progress Notes (Signed)
DISCONTINUE ON PATHWAY REGIMEN - Breast     A cycle is every 14 days (cycles 1-4):     Doxorubicin      Cyclophosphamide      Pegfilgrastim-xxxx    A cycle is every 21 days (cycles 5-8):     Paclitaxel      Carboplatin   **Always confirm dose/schedule in your pharmacy ordering system**  REASON: Other Reason PRIOR TREATMENT: [id number]: Dose-Dense AC [Doxorubicin + Cyclophosphamide q14 Days x 4 Cycles], Followed by Paclitaxel 80 mg/m2 Weekly + Carboplatin AUC=6 q21 Days x 12 Weeks TREATMENT RESPONSE: Unable to Evaluate  START ON PATHWAY REGIMEN - Breast     A cycle is every 21 days:     Pertuzumab      Pertuzumab      Trastuzumab-xxxx      Trastuzumab-xxxx      Carboplatin      Docetaxel   **Always confirm dose/schedule in your pharmacy ordering system**  Patient Characteristics: Preoperative or Nonsurgical Candidate (Clinical Staging), Neoadjuvant Therapy followed by Surgery, Invasive Disease, Chemotherapy, HER2 Positive, ER Negative/Unknown Therapeutic Status: Preoperative or Nonsurgical Candidate (Clinical Staging) AJCC M Category: cM0 AJCC Grade: G3 Breast Surgical Plan: Neoadjuvant Therapy followed by Surgery ER Status: Negative (-) AJCC 8 Stage Grouping: IIA HER2 Status: Positive (+) AJCC T Category: cT1 AJCC N Category: cN1 PR Status: Negative (-) Intent of Therapy: Curative Intent, Discussed with Patient

## 2019-11-08 ENCOUNTER — Encounter: Payer: Self-pay | Admitting: *Deleted

## 2019-11-08 ENCOUNTER — Encounter (HOSPITAL_BASED_OUTPATIENT_CLINIC_OR_DEPARTMENT_OTHER): Payer: Self-pay | Admitting: Surgery

## 2019-11-08 ENCOUNTER — Other Ambulatory Visit: Payer: Self-pay

## 2019-11-08 ENCOUNTER — Other Ambulatory Visit: Payer: Self-pay | Admitting: Oncology

## 2019-11-08 ENCOUNTER — Encounter: Payer: 59 | Admitting: Plastic Surgery

## 2019-11-08 NOTE — Progress Notes (Unsigned)
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  She will learn more about it when she returns in January 29 for the chemotherapy teaching session and of course I will discuss it further when she returns to see me after she has her port and echo and staging studies done.

## 2019-11-09 ENCOUNTER — Ambulatory Visit (HOSPITAL_COMMUNITY)
Admission: RE | Admit: 2019-11-09 | Discharge: 2019-11-09 | Disposition: A | Discharge status: Home / Self Care | Payer: 59 | Source: Ambulatory Visit | Attending: Oncology | Admitting: Oncology

## 2019-11-09 ENCOUNTER — Encounter (HOSPITAL_BASED_OUTPATIENT_CLINIC_OR_DEPARTMENT_OTHER): Payer: 59 | Admitting: Internal Medicine

## 2019-11-09 ENCOUNTER — Telehealth: Payer: Self-pay | Admitting: *Deleted

## 2019-11-09 DIAGNOSIS — C50112 Malignant neoplasm of central portion of left female breast: Secondary | ICD-10-CM

## 2019-11-09 DIAGNOSIS — C50812 Malignant neoplasm of overlapping sites of left female breast: Secondary | ICD-10-CM

## 2019-11-09 DIAGNOSIS — E1142 Type 2 diabetes mellitus with diabetic polyneuropathy: Secondary | ICD-10-CM

## 2019-11-09 DIAGNOSIS — Z6841 Body Mass Index (BMI) 40.0 and over, adult: Secondary | ICD-10-CM

## 2019-11-09 DIAGNOSIS — L97311 Non-pressure chronic ulcer of right ankle limited to breakdown of skin: Secondary | ICD-10-CM

## 2019-11-09 DIAGNOSIS — I872 Venous insufficiency (chronic) (peripheral): Secondary | ICD-10-CM

## 2019-11-09 DIAGNOSIS — C50412 Malignant neoplasm of upper-outer quadrant of left female breast: Secondary | ICD-10-CM

## 2019-11-09 DIAGNOSIS — E11622 Type 2 diabetes mellitus with other skin ulcer: Secondary | ICD-10-CM | POA: Diagnosis not present

## 2019-11-09 DIAGNOSIS — E119 Type 2 diabetes mellitus without complications: Secondary | ICD-10-CM

## 2019-11-09 DIAGNOSIS — Z171 Estrogen receptor negative status [ER-]: Secondary | ICD-10-CM

## 2019-11-09 MED ORDER — GADOBUTROL 1 MMOL/ML IV SOLN: 10.0000 mL | Freq: Once | INTRAVENOUS | Status: DC | PRN

## 2019-11-09 NOTE — Telephone Encounter (Signed)
Per FMLA cover sheet reads patient has not decided if intermittent or continuous leave is what she would like.    "Was told I could have both.  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  I think I am leaning more towards intermittent but I don't know. is what I'll do.  Let me call you tomorrow."        Reviewed effect of treatment to blood cells and immunity treatment education.  Awaiting return call from patient.

## 2019-11-09 NOTE — Progress Notes (Signed)
GINNIE, SCHMUDE ([id number]) Visit Report for 11/09/2019 Debridement Details Patient Name: [name], [name]. Date of Service: 11/09/2019 9:45 AM Medical Record [id number] Patient Account Number: [id number] Date of Birth/Sex: 1956-04-13 (63 y.o. Female) Treating RN: Primary Care Provider: Cathlean Cower Other Clinician: Referring Provider: Treating Provider/Extender:Chananya Canizalez, Arta Bruce, Casper Harrison in Treatment: 1 Debridement Performed for Wound #1 Right Achilles Assessment: Performed By: Physician Ricard Dillon., MD Debridement Type: Debridement Severity of Tissue Pre Fat layer exposed Debridement: Level of Consciousness (Pre- Awake and Alert procedure): Pre-procedure Verification/Time Out Taken: Yes - 08:50 Start Time: 08:51 Pain Control: Lidocaine 4% Topical Solution Total Area Debrided (L x W): 1.5 (cm) x 0.2 (cm) = 0.3 (cm) Tissue and other material Viable, Non-Viable, Slough, Subcutaneous, Fibrin/Exudate, Slough debrided: Level: Skin/Subcutaneous Tissue Debridement Description: Excisional Instrument: Curette Bleeding: Minimum End Time: 08:54 Procedural Pain: 0 Post Procedural Pain: 2 Response to Treatment: Procedure was tolerated well Level of Consciousness Awake and Alert (Post-procedure): Post Debridement Measurements of Total Wound Length: (cm) 1.5 Width: (cm) 0.2 Depth: (cm) 0.2 Volume: (cm) 0.047 Character of Wound/Ulcer Post Requires Further Debridement Debridement: Severity of Tissue Post Debridement: Fat layer exposed Post Procedure Diagnosis Same as Pre-procedure Electronic Signature(s) Signed: 11/09/2019 5:54:58 PM By: Linton Ham MD Entered By: Linton Ham on 11/09/2019 09:06:41 -------------------------------------------------------------------------------- HPI Details Patient Name: Date of Service: [name] 11/09/2019 9:45 AM Medical Record [id number] Patient Account Number: [id number] Date of  Birth/Sex: Treating RN: [birth year] (64 y.o. Female) Primary Care Provider: Cathlean Cower Other Clinician: Referring Provider: Treating Provider/Extender:Nayson Traweek, Arta Bruce, Casper Harrison in Treatment: 1 History of Present Illness HPI Description: 11/02/2019 ADMISSION This is a 64 year old woman who has a wound on the right posterior Achilles area towards the distal end of the Achilles tendon surgery scars. She tells me that roughly 2-1/2 to 3 weeks ago she was picking at some dry skin in the area pulled it off and that is how the wound opened up. She had an original spontaneous Achilles tendon rupture in the early 1990s that was repaired and then very shortly thereafter it reruptured and she required another surgery. She has not had any problems with this since then. The wound itself currently has episodic sharp pain some swelling. She was put on Augmentin by her primary physician on 10/30/2019 for possible coexistent cellulitis. She used hydrogen peroxide for a while with topical antibiotics. Mostly she has been using topical antibiotics as somebody told her that hydrogen peroxide was not good to use. 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 Surgery was in [place], bilateral total knee replacements. ABIs in our clinic on the right were 1.09 1/21; patient here with a linear wound in the right Achilles area. Her wrap fell down she did not call us to replace it. She has questions about her compression wrap necessity. Finally she asked if the wound could be sutured The patient is under a lot of pressure with initiation of chemotherapy she is going to have a port placed etc. Electronic Signature(s) Signed: 11/09/2019  5:54:58 PM By: Linton Ham MD Entered By: Linton Ham on 11/09/2019 09:12:18 -------------------------------------------------------------------------------- Physical Exam Details Patient Name: Date of Service: [name] 11/09/2019 9:45 AM Medical Record [id number] Patient Account Number: [id number] Date of Birth/Sex:  Treating RN: [birth year] (64 y.o. Female) Primary Care Provider: Cathlean Cower Other Clinician: Referring Provider: Treating Provider/Extender:Kimorah Ridolfi, Arta Bruce, Casper Harrison in Treatment: 1 Constitutional Patient is hypertensive.. Pulse regular and within target range for patient.[name] Respirations regular, non-labored and within target range.. Temperature is normal and within the target range for the patient.[name] Appears in no distress. Notes Wound exam; the patient has a small linear caudally orientated slit in the lower part of her Achilles tendon insertion. There is scar tissue here. She has some degree of chronic venous insufficiency and secondary lymphedema above this. Under illumination the base of the wound is not healthy with fibrinous debris. Using a #3 curette I made an attempt to debride this in this small narrow wound with relative large depth. Electronic Signature(s) Signed: 11/09/2019 5:54:58 PM By: Linton Ham MD Entered By: Linton Ham on 11/09/2019 09:14:30 -------------------------------------------------------------------------------- Physician Orders Details Patient Name: Date of Service: [name] 11/09/2019 9:45 AM Medical Record [id number] Patient Account Number: [id number] Date of Birth/Sex: Treating RN: [birth year] (64 y.o. Female) Rolin Barry, [place] Primary Care Provider: Cathlean Cower Other Clinician: Referring Provider: Treating Provider/Extender:Karissa Meenan, Arta Bruce, Casper Harrison in Treatment: 1 Verbal / Phone Orders: No Diagnosis Coding ICD-10 Coding Code Description [id number] Non-pressure chronic  ulcer of right ankle with fat layer exposed Disruption of external operation (surgical) wound, not elsewhere classified, subsequent T81.31XD encounter I87.311 Chronic venous hypertension (idiopathic) with ulcer of right lower extremity E11.622 Type 2 diabetes mellitus with other skin ulcer Follow-up Appointments Return Appointment in 1 week. - Thursday Dressing Change Frequency Wound #1 Right Achilles Do not change entire dressing for one week. Skin Barriers/Peri-Wound Care Wound #1 Right Achilles Moisturizing lotion Wound Cleansing Wound #1 Right Achilles May shower with protection. - May use cast protector Primary Wound Dressing Wound #1 Right Achilles Iodoflex Secondary Dressing Wound #1 Right Achilles Dry Gauze ABD pad Edema Control 3 Layer Compression System - Right Lower Extremity - apply unna boot first layer to upper portion of lower leg to secure wrap in place. Avoid standing for long periods of time Elevate legs to the level of the heart or above for 30 minutes daily and/or when sitting, a frequency of: - throughout the day Exercise regularly Additional Orders / Instructions Other: - patient to call wound center for a nurse visit if the compression wrap slides down leg. Electronic Signature(s) Signed: 11/09/2019 5:54:58 PM By: Linton Ham MD Signed: 11/09/2019 6:20:01 PM By: Deon Pilling Entered By: Deon Pilling on 11/09/2019 08:50:48 -------------------------------------------------------------------------------- Problem List Details Patient Name: Date of Service: [name] 11/09/2019 9:45 AM Medical Record [id number] Patient Account Number: [id number] Date of Birth/Sex: Treating RN: 03/23/1956 (64 y.o. Female) Deon Pilling Primary Care Provider: Cathlean Cower Other Clinician: Referring Provider: Treating Provider/Extender:Jaquitta Dupriest, Arta Bruce, Casper Harrison in Treatment: 1 Active Problems ICD-10 Evaluated Encounter Code Description  Active Date Today Diagnosis L97.312 Non-pressure chronic ulcer of right ankle with fat layer 11/02/2019 No Yes exposed T81.31XD Disruption of external operation (surgical) wound, not 11/02/2019 No Yes elsewhere classified, subsequent encounter I87.311 Chronic venous hypertension (idiopathic) with ulcer of 11/02/2019 No Yes right lower extremity E11.622 Type 2 diabetes mellitus with other skin ulcer 11/02/2019 No Yes Inactive Problems Resolved Problems Electronic Signature(s) Signed: 11/09/2019 5:54:58 PM By: Linton Ham MD Entered By: Linton Ham on 11/09/2019 09:06:21 -------------------------------------------------------------------------------- Progress Note Details Patient Name: Date of Service: [name] 11/09/2019 9:45 AM Medical Record [id number] Patient Account Number: [id number] Date of Birth/Sex: Treating RN: 19-Feb-1956 (63 y.o. Female) Primary Care Provider: Cathlean Cower  Other Clinician: Referring Provider: Treating Provider/Extender:Disha Cottam, Arta Bruce, Casper Harrison in Treatment: 1 Subjective History of Present Illness (HPI) 11/02/2019 ADMISSION This is a 64 year old woman who has a wound on the right posterior Achilles area towards the distal end of the Achilles tendon surgery scars. She tells me that roughly 2-1/2 to 3 weeks ago she was picking at some dry skin in the area pulled it off and that is how the wound opened up. She had an original spontaneous Achilles tendon rupture in the early 1990s that was repaired and then very shortly thereafter it reruptured and she required another surgery. She has not had any problems with this since then. The wound itself currently has episodic sharp pain some swelling. She was put on Augmentin by her primary physician on 10/30/2019 for possible coexistent cellulitis. She used hydrogen peroxide for a while with topical antibiotics. Mostly she has been using topical antibiotics as somebody told her that  hydrogen peroxide was not good to use. 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 Surgery was in [place], bilateral total knee replacements. ABIs in our clinic on the right were 1.09 1/21; patient here with a linear wound in the right Achilles area. Her wrap fell down she did not call us to replace it. She has questions about her compression wrap necessity. Finally she asked if the wound could be sutured The patient is under a lot of pressure with initiation of chemotherapy she is going to have a port placed etc. Objective Constitutional Patient is hypertensive.. Pulse regular and within target range for patient.[name] Respirations regular, non-labored and within target range.. Temperature is normal and within the target range for the patient.[name] Appears in no distress. Vitals Time Taken: 8:30 AM, Height: 59 in, Weight: 250 lbs, BMI: 50.5, Temperature: 98.1 F, Pulse: 69 bpm, Respiratory Rate: 18 breaths/min, Blood Pressure: 146/98 mmHg. General Notes: Wound exam; the patient has a small linear caudally orientated slit in the lower part of her Achilles tendon insertion. There is scar tissue here. She has some degree of chronic venous insufficiency and secondary lymphedema above this. Under illumination the base of the wound is not healthy with fibrinous debris. Using a #3 curette I made an attempt to debride this in this small narrow wound with relative large depth. Integumentary (Hair, Skin) Wound #1 status is Open. Original cause of wound was Trauma. The wound is located on the Right Achilles. The wound measures 1.5cm length x 0.2cm width x 0.2cm depth; 0.236cm^2 area and 0.047cm^3 volume. There is Fat Layer (Subcutaneous  Tissue) Exposed exposed. There is no tunneling or undermining noted. There is a small amount of serosanguineous drainage noted. The wound margin is flat and intact. There is large (67-100%) pink, pale granulation within the wound bed. There is a small (1-33%) amount of necrotic tissue within the wound bed including Adherent Slough. Assessment Active Problems ICD-10 Non-pressure chronic ulcer of right ankle with fat layer exposed Disruption of external operation (surgical) wound, not elsewhere classified, subsequent encounter Chronic venous hypertension (idiopathic) with ulcer of right lower extremity Type 2 diabetes mellitus with other skin ulcer Procedures Wound #1 Pre-procedure diagnosis of Wound #1 is a Diabetic Wound/Ulcer of the Lower Extremity located on the Right Achilles .Severity of Tissue  Pre Debridement is: Fat layer exposed. There was a Excisional Skin/Subcutaneous Tissue Debridement with a total area of 0.3 sq cm performed by Ricard Dillon., MD. With the following instrument(s): Curette to remove Viable and Non-Viable tissue/material. Material removed includes Subcutaneous Tissue, Slough, and Fibrin/Exudate after achieving pain control using Lidocaine 4% Topical Solution. A time out was conducted at 08:50, prior to the start of the procedure. A Minimum amount of bleeding was controlled with N/A. The procedure was tolerated well with a pain level of 0 throughout and a pain level of 2 following the procedure. Post Debridement Measurements: 1.5cm length x 0.2cm width x 0.2cm depth; 0.047cm^3 volume. Character of Wound/Ulcer Post Debridement requires further debridement. Severity of Tissue Post Debridement is: Fat layer exposed. Post procedure Diagnosis Wound #1: Same as Pre-Procedure Pre-procedure diagnosis of Wound #1 is a Diabetic Wound/Ulcer of the Lower Extremity located on the Right Achilles . There was a Three Layer Compression Therapy Procedure with a pre-treatment ABI  of 1.1 by Baruch Gouty, RN. Post procedure Diagnosis Wound #1: Same as Pre-Procedure Plan Follow-up Appointments: Return Appointment in 1 week. - Thursday Dressing Change Frequency: Wound #1 Right Achilles: Do not change entire dressing for one week. Skin Barriers/Peri-Wound Care: Wound #1 Right Achilles: Moisturizing lotion Wound Cleansing: Wound #1 Right Achilles: May shower with protection. - May use cast protector Primary Wound Dressing: Wound #1 Right Achilles: Iodoflex Secondary Dressing: Wound #1 Right Achilles: Dry Gauze ABD pad Edema Control: 3 Layer Compression System - Right Lower Extremity - apply unna boot first layer to upper portion of lower leg to secure wrap in place. Avoid standing for long periods of time Elevate legs to the level of the heart or above for 30 minutes daily and/or when sitting, a frequency of: - throughout the day Exercise regularly Additional Orders / Instructions: Other: - patient to call wound center for a nurse visit if the compression wrap slides down leg. 1. Continue with Iodoflex to the right Achilles tendon 2. I told the patient she absolutely needs to be wrapped if we are going to have a good chance to get this to close although I understand her reasoning especially the stress of her cancer chemotherapy to not do this. I told her she would have to make a decision. I do not think that anything she could do at home i.e. Ace wrapping would be good enough to control the swelling around this area. As it stands her wrap fell down and her swelling control was not good anything any case today. We will try to [facility] boot this at the top 3. The only option for an attempted primary closure would be to going open this area again and that would have to be done by surgery. I told her this as well Electronic Signature(s) Signed: 11/09/2019 5:54:58 PM By: Linton Ham MD Entered By: Linton Ham on 11/09/2019  09:15:57 -------------------------------------------------------------------------------- [place] Details Patient Name: Date of Service: [name] 11/09/2019 Medical Record [id number] Patient Account Number: [id number] Date of Birth/Sex: Treating RN: Mar 02, 1956 (64 y.o. Female) Deon Pilling Primary Care Provider: Cathlean Cower Other Clinician: Referring Provider: Treating Provider/Extender:Ajai Harville, Arta Bruce, Casper Harrison in Treatment: 1 Diagnosis Coding ICD-10 Codes Code Description [id number] Non-pressure chronic ulcer of right ankle with fat layer exposed Disruption of external operation (surgical) wound, not elsewhere classified, subsequent T81.31XD encounter I87.311 Chronic venous hypertension (idiopathic) with ulcer of right lower extremity E11.622 Type 2 diabetes mellitus with other skin ulcer Facility Procedures CPT4 Code Description: [id number] -  DEB SUBQ TISSUE 20 SQ CM/< ICD-10 Diagnosis Description [id number] Non-pressure chronic ulcer of right ankle with fat la Modifier: yer exposed Quantity: 1 Physician Procedures CPT4 Code Description: [id number] - WC PHYS SUBQ TISS 20 SQ CM ICD-10 Diagnosis Description [id number] Non-pressure chronic ulcer of right ankle with fat la Modifier: yer exposed Quantity: 1 Electronic Signature(s) Signed: 11/09/2019 5:54:58 PM By: Linton Ham MD Entered By: Linton Ham on 11/09/2019 09:16:13

## 2019-11-10 ENCOUNTER — Ambulatory Visit: Payer: 59 | Admitting: Oncology

## 2019-11-10 ENCOUNTER — Other Ambulatory Visit: Payer: Self-pay | Admitting: *Deleted

## 2019-11-10 ENCOUNTER — Ambulatory Visit (HOSPITAL_COMMUNITY)
Admission: RE | Admit: 2019-11-10 | Discharge: 2019-11-10 | Disposition: A | Discharge status: Home / Self Care | Payer: 59 | Source: Ambulatory Visit | Attending: Oncology | Admitting: Oncology

## 2019-11-10 ENCOUNTER — Other Ambulatory Visit: Payer: Self-pay

## 2019-11-10 DIAGNOSIS — E1142 Type 2 diabetes mellitus with diabetic polyneuropathy: Secondary | ICD-10-CM | POA: Diagnosis not present

## 2019-11-10 DIAGNOSIS — C50112 Malignant neoplasm of central portion of left female breast: Secondary | ICD-10-CM

## 2019-11-10 DIAGNOSIS — E785 Hyperlipidemia, unspecified: Secondary | ICD-10-CM | POA: Diagnosis not present

## 2019-11-10 DIAGNOSIS — Z01810 Encounter for preprocedural cardiovascular examination: Secondary | ICD-10-CM | POA: Insufficient documentation

## 2019-11-10 DIAGNOSIS — I872 Venous insufficiency (chronic) (peripheral): Secondary | ICD-10-CM

## 2019-11-10 DIAGNOSIS — Z171 Estrogen receptor negative status [ER-]: Secondary | ICD-10-CM

## 2019-11-10 DIAGNOSIS — L97311 Non-pressure chronic ulcer of right ankle limited to breakdown of skin: Secondary | ICD-10-CM | POA: Diagnosis not present

## 2019-11-10 DIAGNOSIS — E119 Type 2 diabetes mellitus without complications: Secondary | ICD-10-CM

## 2019-11-10 DIAGNOSIS — C50412 Malignant neoplasm of upper-outer quadrant of left female breast: Secondary | ICD-10-CM

## 2019-11-10 DIAGNOSIS — C50812 Malignant neoplasm of overlapping sites of left female breast: Secondary | ICD-10-CM

## 2019-11-10 DIAGNOSIS — Z6841 Body Mass Index (BMI) 40.0 and over, adult: Secondary | ICD-10-CM

## 2019-11-10 DIAGNOSIS — Z803 Family history of malignant neoplasm of breast: Secondary | ICD-10-CM

## 2019-11-10 DIAGNOSIS — D0512 Intraductal carcinoma in situ of left breast: Secondary | ICD-10-CM

## 2019-11-10 DIAGNOSIS — I10 Essential (primary) hypertension: Secondary | ICD-10-CM | POA: Insufficient documentation

## 2019-11-10 DIAGNOSIS — Z170 Estrogen receptor positive status [ER+]: Secondary | ICD-10-CM

## 2019-11-10 NOTE — Progress Notes (Signed)
  Echocardiogram 2D Echocardiogram has been performed.  Jennette Dubin 11/10/2019, 11:09 AM

## 2019-11-10 NOTE — Telephone Encounter (Signed)
11/09/2019: [name] [phone]).  Calling to let you know to put that [facility] document as continuous.  If I need intermittent, I'll just request it again.  Call if you need to talk to me again."  11/10/2019:  Returned call to patient.  Transferred to collaborative with need to reschedule MRI. "[facility] has a larger MRI machine.  Test run tried yesterday at [facility].  Lying face down on my breast, felt the MRI machine pressing on my backside and unable to complete MRI."

## 2019-11-13 ENCOUNTER — Other Ambulatory Visit: Payer: Self-pay | Admitting: Oncology

## 2019-11-13 ENCOUNTER — Other Ambulatory Visit: Payer: Self-pay

## 2019-11-13 ENCOUNTER — Encounter (HOSPITAL_COMMUNITY)
Admission: RE | Admit: 2019-11-13 | Discharge: 2019-11-13 | Disposition: A | Discharge status: Home / Self Care | Payer: 59 | Source: Ambulatory Visit | Attending: Oncology | Admitting: Oncology

## 2019-11-13 ENCOUNTER — Encounter (HOSPITAL_BASED_OUTPATIENT_CLINIC_OR_DEPARTMENT_OTHER)
Admission: RE | Admit: 2019-11-13 | Discharge: 2019-11-13 | Disposition: A | Discharge status: Home / Self Care | Payer: 59 | Source: Ambulatory Visit | Attending: Surgery | Admitting: Surgery

## 2019-11-13 ENCOUNTER — Inpatient Hospital Stay (HOSPITAL_COMMUNITY): Admission: RE | Admit: 2019-11-13 | Payer: 59 | Source: Ambulatory Visit

## 2019-11-13 ENCOUNTER — Ambulatory Visit (HOSPITAL_COMMUNITY)
Admission: RE | Admit: 2019-11-13 | Discharge: 2019-11-13 | Disposition: A | Discharge status: Home / Self Care | Payer: 59 | Source: Ambulatory Visit | Attending: Oncology | Admitting: Oncology

## 2019-11-13 DIAGNOSIS — C50112 Malignant neoplasm of central portion of left female breast: Secondary | ICD-10-CM | POA: Insufficient documentation

## 2019-11-13 DIAGNOSIS — Z171 Estrogen receptor negative status [ER-]: Secondary | ICD-10-CM | POA: Diagnosis present

## 2019-11-13 DIAGNOSIS — C50812 Malignant neoplasm of overlapping sites of left female breast: Secondary | ICD-10-CM

## 2019-11-13 DIAGNOSIS — E1142 Type 2 diabetes mellitus with diabetic polyneuropathy: Secondary | ICD-10-CM

## 2019-11-13 DIAGNOSIS — Z6841 Body Mass Index (BMI) 40.0 and over, adult: Secondary | ICD-10-CM

## 2019-11-13 DIAGNOSIS — E119 Type 2 diabetes mellitus without complications: Secondary | ICD-10-CM

## 2019-11-13 DIAGNOSIS — L97311 Non-pressure chronic ulcer of right ankle limited to breakdown of skin: Secondary | ICD-10-CM | POA: Insufficient documentation

## 2019-11-13 DIAGNOSIS — I872 Venous insufficiency (chronic) (peripheral): Secondary | ICD-10-CM

## 2019-11-13 DIAGNOSIS — C50412 Malignant neoplasm of upper-outer quadrant of left female breast: Secondary | ICD-10-CM

## 2019-11-13 DIAGNOSIS — C7981 Secondary malignant neoplasm of breast: Secondary | ICD-10-CM | POA: Insufficient documentation

## 2019-11-13 DIAGNOSIS — Z01818 Encounter for other preprocedural examination: Secondary | ICD-10-CM | POA: Insufficient documentation

## 2019-11-13 LAB — BASIC METABOLIC PANEL
Anion gap: 9 (ref 5–15)
BUN: 14 mg/dL (ref 8–23)
CO2: 24 mmol/L (ref 22–32)
Calcium: 9.1 mg/dL (ref 8.9–10.3)
Chloride: 103 mmol/L (ref 98–111)
Creatinine, Ser: 0.79 mg/dL (ref 0.44–1.00)
GFR calc Af Amer: >60 mL/min (ref >60)
GFR calc non Af Amer: >60 mL/min (ref >60)
Glucose, Bld: 123 mg/dL — ABNORMAL HIGH (ref 70–99)
Potassium: 4 mmol/L (ref 3.5–5.1)
Sodium: 136 mmol/L (ref 135–145)

## 2019-11-13 MED ORDER — SODIUM CHLORIDE (PF) 0.9 % IJ SOLN
INTRAMUSCULAR | Status: AC
  Filled 2019-11-13: qty 50

## 2019-11-13 MED ORDER — IOHEXOL 300 MG/ML  SOLN
75.0000 mL | Freq: Once | INTRAMUSCULAR | Status: AC | PRN
  Administered 2019-11-13: 75 mL via INTRAVENOUS

## 2019-11-13 MED ORDER — TECHNETIUM TC 99M MEDRONATE IV KIT
20.0000 | PACK | Freq: Once | INTRAVENOUS | Status: AC | PRN
  Administered 2019-11-13: 21 via INTRAVENOUS

## 2019-11-13 NOTE — Progress Notes (Addendum)
[name], [name] ([id number]) Visit Report for 11/09/2019 Arrival Information Details Patient Name: Date of Service: [name], [name] 11/09/2019 9:45 AM Medical Record [id number] Patient Account Number: [id number] Date of Birth/Sex: Treating RN: 1956-07-22 (64 y.o. Female) Levan Hurst Primary Care Ramzy Cappelletti: Cathlean Cower Other Clinician: Referring Raylin Winer: Treating Jeorge Reister/Extender:Robson, Arta Bruce, Casper Harrison in Treatment: 1 Visit Information History Since Last Visit Added or deleted any medications: No Patient Arrived: Ambulatory Any new allergies or adverse reactions: No Arrival Time: 08:29 Had a fall or experienced change in No Accompanied By: alone activities of daily living that may affect Transfer Assistance: None risk of falls: Patient Identification Verified: Yes Signs or symptoms of abuse/neglect since last No Secondary Verification Process Completed: Yes visito Patient Requires Transmission-Based No Hospitalized since last visit: No Precautions: Implantable device outside of the clinic excluding No Patient Has Alerts: No cellular tissue based products placed in the center since last visit: Has Dressing in Place as Prescribed: Yes Has Compression in Place as Prescribed: Yes Pain Present Now: No Electronic Signature(s) Signed: 11/13/2019 6:46:58 PM By: Levan Hurst RN, BSN Entered By: Levan Hurst on 11/09/2019 08:30:10 -------------------------------------------------------------------------------- Compression Therapy Details Patient Name: [name] Date of Service: 11/09/2019 9:45 AM Medical Record [id number] Patient Account Number: [id number] Date of Birth/Sex: 1956-09-06 (63 y.o. Female) Treating RN: Deon Pilling Primary Care Shereen Marton: Cathlean Cower Other Clinician: Referring Dilyn Smiles: Treating Bradlee Bridgers/Extender:Robson, Arta Bruce, Casper Harrison in Treatment: 1 Compression Therapy Performed for Wound Wound #1  Right Achilles Assessment: Performed By: Clinician Baruch Gouty, RN Compression Type: Three Layer Pre Treatment ABI: 1.1 Post Procedure Diagnosis Same as Pre-procedure Electronic Signature(s) Signed: 11/09/2019 6:20:01 PM By: Deon Pilling Entered By: Deon Pilling on 11/09/2019 08:51:12 -------------------------------------------------------------------------------- Encounter Discharge Information Details Patient Name: Date of Service: [name] 11/09/2019 9:45 AM Medical Record [id number] Patient Account Number: [id number] Date of Birth/Sex: Treating RN: 12/28/1955 (64 y.o. Female) Baruch Gouty Primary Care Mileah Hemmer: Cathlean Cower Other Clinician: Referring Iliani Vejar: Treating Thong Feeny/Extender:Robson, Arta Bruce, Casper Harrison in Treatment: 1 Encounter Discharge Information Items Post Procedure Vitals Discharge Condition: Stable Temperature (F): 98.1 Ambulatory Status: Ambulatory Pulse (bpm): 69 Discharge Destination: Home Respiratory Rate (breaths/min): 18 Transportation: Private Auto Blood Pressure (mmHg): 146/98 Accompanied By: self Schedule Follow-up Appointment: No Clinical Summary of Care: Electronic Signature(s) Signed: 11/09/2019 5:58:49 PM By: Baruch Gouty RN, BSN Entered By: Baruch Gouty on 11/09/2019 09:17:09 -------------------------------------------------------------------------------- Lower Extremity Assessment Details Patient Name: Date of Service: [name] 11/09/2019 9:45 AM Medical Record [id number] Patient Account Number: [id number] Date of Birth/Sex: Treating RN: August 26, 1956 (64 y.o. Female) Levan Hurst Primary Care Tyeson Tanimoto: Cathlean Cower Other Clinician: Referring Yajaira Doffing: Treating Shalayna Ornstein/Extender:Robson, Arta Bruce, Casper Harrison in Treatment: 1 Edema Assessment Assessed: [Left: No] [Right: No] Edema: [Left: Ye] [Right: s] Calf Left: Right: Point of Measurement: cm From Medial Instep cm 43  cm Ankle Left: Right: Point of Measurement: cm From Medial Instep cm 23.8 cm Vascular Assessment Pulses: Dorsalis Pedis Palpable: [Right:Yes] Electronic Signature(s) Signed: 11/13/2019 6:46:58 PM By: Levan Hurst RN, BSN Entered By: Levan Hurst on 11/09/2019 08:34:45 -------------------------------------------------------------------------------- Multi Wound Chart Details Patient Name: Date of Service: [name] 11/09/2019 9:45 AM Medical Record [id number] Patient Account Number: [id number] Date of Birth/Sex: Treating RN: 01/28/1956 (64 y.o. Female) Primary Care Emelynn Rance: Cathlean Cower Other Clinician: Referring Javayah Magaw: Treating Aishani Kalis/Extender:Robson, Arta Bruce, Casper Harrison in Treatment: 1 Vital Signs Height(in): 59 Pulse(bpm): 44 Weight(lbs): 250 Blood Pressure(mmHg): 146/98 Body Mass Index(BMI): 50 Temperature(F): 98.1 Respiratory 18 Rate(breaths/min): Photos: [1:No Photos] [N/A:N/A] Wound Location: [1:Right Achilles] [  N/A:N/A] Wounding Event: [1:Trauma] [N/A:N/A] Primary Etiology: [1:Diabetic Wound/Ulcer of the N/A Lower Extremity] Comorbid History: [1:Anemia, Lymphedema, Sleep Apnea, Hypertension, Type II Diabetes, Osteoarthritis, Received Radiation] [N/A:N/A] Date Acquired: [1:10/16/2019] [N/A:N/A] Weeks of Treatment: [1:1] [N/A:N/A] Wound Status: [1:Open] [N/A:N/A] Measurements L x W x D 1.5x0.2x0.2 [N/A:N/A] (cm) Area (cm) : [1:0.236] [N/A:N/A] Volume (cm) : [1:0.047] [N/A:N/A] % Reduction in Area: [1:37.40%] [N/A:N/A] % Reduction in Volume: [1:58.40%] [N/A:N/A] Classification: [1:Grade 1] [N/A:N/A] Exudate Amount: [1:Small] [N/A:N/A] Exudate Type: [1:Serosanguineous] [N/A:N/A] Exudate Color: [1:red, brown] [N/A:N/A] Wound Margin: [1:Flat and Intact] [N/A:N/A] Granulation Amount: [1:Large (67-100%)] [N/A:N/A] Granulation Quality: [1:Pink, Pale] [N/A:N/A] Necrotic Amount: [1:Small (1-33%)] [N/A:N/A] Exposed  Structures: [1:Fat Layer (Subcutaneous Tissue) Exposed: Yes Fascia: No Tendon: No Muscle: No Joint: No Bone: No] [N/A:N/A] Epithelialization: [1:Small (1-33%)] [N/A:N/A] Debridement: [1:Debridement - Excisional] [N/A:N/A] Pre-procedure [1:08:50] [N/A:N/A] Verification/Time Out Taken: Pain Control: [1:Lidocaine 4% Topical Solution] [N/A:N/A] Tissue Debrided: [1:Subcutaneous, Slough] [N/A:N/A] Level: [1:Skin/Subcutaneous Tissue] [N/A:N/A] Debridement Area (sq cm):0.3 [N/A:N/A] Instrument: [1:Curette] [N/A:N/A] Bleeding: [1:Minimum] [N/A:N/A] Procedural Pain: [1:0] [N/A:N/A] Post Procedural Pain: [1:2] [N/A:N/A] Debridement Treatment Procedure was tolerated [N/A:N/A] Response: [1:well] Post Debridement [1:1.5x0.2x0.2] [N/A:N/A] Measurements L x W x D (cm) Post Debridement [1:0.047] [N/A:N/A] Volume: (cm) Procedures Performed: Compression Therapy [1:Debridement] [N/A:N/A] Treatment Notes Electronic Signature(s) Signed: 11/09/2019 5:54:58 PM By: Linton Ham MD Entered By: Linton Ham on 11/09/2019 09:06:32 -------------------------------------------------------------------------------- Multi-Disciplinary Care Plan Details Patient Name: Date of Service: [name] 11/09/2019 9:45 AM Medical Record [id number] Patient Account Number: [id number] Date of Birth/Sex: Mar 14, 1956 (63 y.o. Female) Treating RN: Deon Pilling Primary Care Elaysia Devargas: Other Clinician: Cathlean Cower Referring Camika Marsico: Treating Elzia Hott/Extender:Robson, Arta Bruce, Casper Harrison in Treatment: 1 Active Inactive Nutrition Nursing Diagnoses: Imbalanced nutrition Potential for alteratiion in Nutrition/Potential for imbalanced nutrition Goals: Patient/caregiver agrees to and verbalizes understanding of need to use nutritional supplements and/or vitamins as prescribed Date Initiated: 11/02/2019 Target Resolution Date: 12/01/2019 Goal Status: Active Patient/caregiver will maintain  therapeutic glucose control Date Initiated: 11/02/2019 Target Resolution Date: 12/01/2019 Goal Status: Active Interventions: Assess HgA1c results as ordered upon admission and as needed Assess patient nutrition upon admission and as needed per policy Provide education on elevated blood sugars and impact on wound healing Provide education on nutrition Treatment Activities: Education provided on Nutrition : 11/02/2019 Notes: Wound/Skin Impairment Nursing Diagnoses: Impaired tissue integrity Knowledge deficit related to ulceration/compromised skin integrity Goals: Patient/caregiver will verbalize understanding of skin care regimen Date Initiated: 11/02/2019 Target Resolution Date: 12/01/2019 Goal Status: Active Ulcer/skin breakdown will have a volume reduction of 30% by week 4 Date Initiated: 11/02/2019 Target Resolution Date: 12/01/2019 Goal Status: Active Interventions: Assess patient/caregiver ability to obtain necessary supplies Assess patient/caregiver ability to perform ulcer/skin care regimen upon admission and as needed Assess ulceration(s) every visit Provide education on ulcer and skin care Notes: Electronic Signature(s) Signed: 11/09/2019 6:20:01 PM By: Deon Pilling Entered By: Deon Pilling on 11/09/2019 08:49:16 -------------------------------------------------------------------------------- Pain Assessment Details Patient Name: Date of Service: [name], [name] 11/09/2019 9:45 AM Medical Record [id number] Patient Account Number: [id number] Date of Birth/Sex: Treating RN: 09-06-1956 (64 y.o. Female) Levan Hurst Primary Care Amiliana Foutz: Cathlean Cower Other Clinician: Referring Tieshia Rettinger: Treating Tiah Heckel/Extender:Robson, Arta Bruce, Casper Harrison in Treatment: 1 Active Problems Location of Pain Severity and Description of Pain Patient Has Paino No Site Locations Pain Management and Medication Current Pain Management: Electronic Signature(s) Signed:  11/13/2019 6:46:58 PM By: Levan Hurst RN, BSN Entered By: Levan Hurst on 11/09/2019 08:32:45 -------------------------------------------------------------------------------- Patient/Caregiver Education Details Laverle Patter 1/21/2021andnbsp9:45 Patient Name: Date of Service: V. AM Medical Record  Patient Account Number: [id number] Number: Treating RN: Deon Pilling May 21, 1956 (63 y.o. Other Clinician: Date of Birth/Gender: Other Clinician: Date of Birth/Gender: Female) Treating Linton Ham Primary Care Physician/Extender: Cathlean Cower Physician: Suella Grove in Treatment: 1 Referring Physician: Cathlean Cower Education Assessment Education Provided To: Patient Education Topics Provided Elevated Blood Sugar/ Impact on Healing: Handouts: Elevated Blood Sugars: How Do They Affect Wound Healing Methods: Explain/Verbal Responses: Reinforcements needed Electronic Signature(s) Signed: 11/09/2019 6:20:01 PM By: Deon Pilling Entered By: Deon Pilling on 11/09/2019 08:49:26 -------------------------------------------------------------------------------- Wound Assessment Details Patient Name: Date of Service: [name], [name] 11/09/2019 9:45 AM Medical Record [id number] Patient Account Number: [id number] Date of Birth/Sex: Treating RN: 1956-04-10 (64 y.o. Female) Primary Care Mazelle Huebert: Cathlean Cower Other Clinician: Referring Moira Umholtz: Treating Joie Reamer/Extender:Robson, Arta Bruce, Casper Harrison in Treatment: 1 Wound Status Wound Number: 1 Primary Diabetic Wound/Ulcer of the Lower Extremity Etiology: Wound Location: Right Achilles Wound Open Wounding Event: Trauma Status: Date Acquired: 10/16/2019 Comorbid Anemia, Lymphedema, Sleep Apnea, Weeks Of Treatment: 1 History: Hypertension, Type II Diabetes, Osteoarthritis, Clustered Wound: No Received Radiation Photos Wound Measurements Length: (cm) 1.5 Width: (cm) 0.2 Depth: (cm) 0.2 Area: (cm)  0.236 Volume: (cm) 0.047 Wound Description Classification: Grade 1 Wound Margin: Flat and Intact Exudate Amount: Small Exudate Type: Serosanguineous Exudate Color: red, brown Wound Bed Granulation Amount: Large (67-100%) Granulation Quality: Pink, Pale Necrotic Amount: Small (1-33%) Necrotic Quality: Adherent Slough After Cleansing: No rino Yes Exposed Structure osed: No (Subcutaneous Tissue) Exposed: Yes osed: No osed: No sed: No ed: No % Reduction in Area: 37.4% % Reduction in Volume: 58.4% Epithelialization: Small (1-33%) Tunneling: No Undermining: No Foul Odor Slough/Fib Fascia Exp Fat Layer Tendon Exp Muscle Exp Joint Expo Bone Expos Treatment Notes Wound #1 (Right Achilles) 2. Periwound Care Moisturizing lotion 3. Primary Dressing Applied Iodoflex 4. Secondary Dressing Dry Gauze 6. Support Layer Applied 3 layer compression wrap Notes netting Electronic Signature(s) Signed: 11/14/2019 5:09:30 PM By: Mikeal Hawthorne EMT/HBOT Previous Signature: 11/13/2019 6:46:58 PM Version By: Levan Hurst RN, BSN Entered By: Mikeal Hawthorne on 11/14/2019 11:05:43 -------------------------------------------------------------------------------- Vitals Details Patient Name: Date of Service: [name] 11/09/2019 9:45 AM Medical Record [id number] Patient Account Number: [id number] Date of Birth/Sex: Treating RN: June 29, 1956 (64 y.o. Female) Levan Hurst Primary Care Denesia Donelan: Cathlean Cower Other Clinician: Referring Joanny Dupree: Treating Jaxten Brosh/Extender:Robson, Arta Bruce, Casper Harrison in Treatment: 1 Vital Signs Time Taken: 08:30 Temperature (F): 98.1 Height (in): 59 Pulse (bpm): 69 Weight (lbs): 250 Respiratory Rate (breaths/min): 18 Body Mass Index (BMI): 50.5 Blood Pressure (mmHg): 146/98 Reference Range: 80 - 120 mg / dl Electronic Signature(s) Signed: 11/13/2019 6:46:58 PM By: Levan Hurst RN, BSN Entered By: Levan Hurst on  11/09/2019 08:32:40

## 2019-11-13 NOTE — Progress Notes (Signed)
EKG reviewed by Dr. Ola Spurr.  Will proceed with surgery as scheduled.

## 2019-11-13 NOTE — Progress Notes (Signed)

## 2019-11-13 NOTE — Progress Notes (Signed)
Anesthesia consult completed by Dr. Ola Spurr, will proceed with surgery as scheduled.

## 2019-11-13 NOTE — Progress Notes (Addendum)
Dr. [name] made aware of patient having open wound on posterior right ankle.  Dr. [name] to bedside to assess and stated will proceed with surgery as scheduled.

## 2019-11-13 NOTE — Progress Notes (Signed)
I called [name] to give her the results of her scans.  It is favorable that we do not see evidence of cancer in her bones, liver, or lungs.  She does have some prevascular lymph nodes which are rather large.  I discussed these with Dr. Isidore Moos and she feels they are outside the fear of where they could irradiate and if they are cancer these would count actually as M1.  Accordingly I am setting her up for a PET scan and a biopsy.  She is aware that we will need to get this done as soon as possible so her chemotherapy is not delayed.

## 2019-11-14 ENCOUNTER — Other Ambulatory Visit: Payer: Self-pay | Admitting: *Deleted

## 2019-11-14 ENCOUNTER — Inpatient Hospital Stay: Payer: 59

## 2019-11-14 ENCOUNTER — Ambulatory Visit (HOSPITAL_COMMUNITY): Admission: RE | Admit: 2019-11-14 | Payer: 59 | Source: Ambulatory Visit

## 2019-11-14 ENCOUNTER — Other Ambulatory Visit: Payer: Self-pay

## 2019-11-14 ENCOUNTER — Other Ambulatory Visit (HOSPITAL_COMMUNITY)
Admission: RE | Admit: 2019-11-14 | Discharge: 2019-11-14 | Disposition: A | Discharge status: Home / Self Care | Payer: 59 | Source: Ambulatory Visit | Attending: Surgery | Admitting: Surgery

## 2019-11-14 ENCOUNTER — Encounter: Payer: Self-pay | Admitting: *Deleted

## 2019-11-14 ENCOUNTER — Other Ambulatory Visit (HOSPITAL_COMMUNITY): Payer: 59

## 2019-11-14 DIAGNOSIS — Z01812 Encounter for preprocedural laboratory examination: Secondary | ICD-10-CM | POA: Diagnosis not present

## 2019-11-14 DIAGNOSIS — Z20822 Contact with and (suspected) exposure to covid-19: Secondary | ICD-10-CM | POA: Diagnosis not present

## 2019-11-14 LAB — SARS CORONAVIRUS 2 (TAT 6-24 HRS): SARS Coronavirus 2: NEGATIVE

## 2019-11-15 ENCOUNTER — Telehealth: Payer: Self-pay | Admitting: *Deleted

## 2019-11-15 ENCOUNTER — Encounter: Payer: Self-pay | Admitting: Oncology

## 2019-11-15 ENCOUNTER — Encounter: Payer: 59 | Admitting: Surgical

## 2019-11-15 ENCOUNTER — Other Ambulatory Visit: Payer: Self-pay | Admitting: *Deleted

## 2019-11-15 NOTE — Progress Notes (Signed)
Calledptto introduce myself as Teacher, adult education andtodiscuss copay assistance. Pt gave me consent to apply in herbehalf so I completed theonlineapplicationwith Civil engineer, contracting foundation for [place]. Pt's app is pending so I will notify herof the outcome once I receive it.  Ialso enrolled her in the[employer] program.  She wasapproved for $25,000 for Perjeta for 12 months from1/27/21. Pt's responsibility will be as little as $5 per infusion.  Pt is overqualified for the [employer].  Iwillgiveher my card for any questions or concernsshe may have in the futureat her next visit.

## 2019-11-15 NOTE — Telephone Encounter (Signed)
This RN reschedule PET scan to 11/20/2019 at 12 noon at [place] pt and obtained VM.  Message left per above as well as this RN's name for return call.

## 2019-11-16 ENCOUNTER — Ambulatory Visit (HOSPITAL_BASED_OUTPATIENT_CLINIC_OR_DEPARTMENT_OTHER): Payer: 59 | Admitting: Anesthesiology

## 2019-11-16 ENCOUNTER — Other Ambulatory Visit: Payer: Self-pay

## 2019-11-16 ENCOUNTER — Encounter (HOSPITAL_BASED_OUTPATIENT_CLINIC_OR_DEPARTMENT_OTHER)
Admission: RE | Disposition: A | Discharge status: Home / Self Care | Payer: Self-pay | Source: Home / Self Care | Attending: Surgery

## 2019-11-16 ENCOUNTER — Ambulatory Visit (HOSPITAL_COMMUNITY): Payer: 59

## 2019-11-16 ENCOUNTER — Ambulatory Visit (HOSPITAL_BASED_OUTPATIENT_CLINIC_OR_DEPARTMENT_OTHER)
Admission: RE | Admit: 2019-11-16 | Discharge: 2019-11-16 | Disposition: A | Discharge status: Home / Self Care | Payer: 59 | Attending: Surgery | Admitting: Surgery

## 2019-11-16 ENCOUNTER — Encounter (HOSPITAL_BASED_OUTPATIENT_CLINIC_OR_DEPARTMENT_OTHER): Payer: 59 | Admitting: Internal Medicine

## 2019-11-16 ENCOUNTER — Encounter (HOSPITAL_BASED_OUTPATIENT_CLINIC_OR_DEPARTMENT_OTHER): Payer: Self-pay | Admitting: Surgery

## 2019-11-16 DIAGNOSIS — M549 Dorsalgia, unspecified: Secondary | ICD-10-CM | POA: Insufficient documentation

## 2019-11-16 DIAGNOSIS — E119 Type 2 diabetes mellitus without complications: Secondary | ICD-10-CM | POA: Diagnosis not present

## 2019-11-16 DIAGNOSIS — M1990 Unspecified osteoarthritis, unspecified site: Secondary | ICD-10-CM | POA: Insufficient documentation

## 2019-11-16 DIAGNOSIS — Z888 Allergy status to other drugs, medicaments and biological substances status: Secondary | ICD-10-CM | POA: Diagnosis not present

## 2019-11-16 DIAGNOSIS — Z7984 Long term (current) use of oral hypoglycemic drugs: Secondary | ICD-10-CM | POA: Diagnosis not present

## 2019-11-16 DIAGNOSIS — E7800 Pure hypercholesterolemia, unspecified: Secondary | ICD-10-CM | POA: Insufficient documentation

## 2019-11-16 DIAGNOSIS — Z8249 Family history of ischemic heart disease and other diseases of the circulatory system: Secondary | ICD-10-CM | POA: Insufficient documentation

## 2019-11-16 DIAGNOSIS — Z8542 Personal history of malignant neoplasm of other parts of uterus: Secondary | ICD-10-CM | POA: Diagnosis not present

## 2019-11-16 DIAGNOSIS — Z79899 Other long term (current) drug therapy: Secondary | ICD-10-CM | POA: Diagnosis not present

## 2019-11-16 DIAGNOSIS — C50912 Malignant neoplasm of unspecified site of left female breast: Secondary | ICD-10-CM | POA: Insufficient documentation

## 2019-11-16 DIAGNOSIS — E11622 Type 2 diabetes mellitus with other skin ulcer: Secondary | ICD-10-CM | POA: Diagnosis not present

## 2019-11-16 DIAGNOSIS — C773 Secondary and unspecified malignant neoplasm of axilla and upper limb lymph nodes: Secondary | ICD-10-CM | POA: Diagnosis not present

## 2019-11-16 DIAGNOSIS — Z886 Allergy status to analgesic agent status: Secondary | ICD-10-CM | POA: Diagnosis not present

## 2019-11-16 DIAGNOSIS — Z803 Family history of malignant neoplasm of breast: Secondary | ICD-10-CM | POA: Insufficient documentation

## 2019-11-16 DIAGNOSIS — K4090 Unilateral inguinal hernia, without obstruction or gangrene, not specified as recurrent: Secondary | ICD-10-CM | POA: Diagnosis not present

## 2019-11-16 DIAGNOSIS — Z833 Family history of diabetes mellitus: Secondary | ICD-10-CM | POA: Insufficient documentation

## 2019-11-16 DIAGNOSIS — E669 Obesity, unspecified: Secondary | ICD-10-CM | POA: Insufficient documentation

## 2019-11-16 DIAGNOSIS — Z923 Personal history of irradiation: Secondary | ICD-10-CM | POA: Insufficient documentation

## 2019-11-16 DIAGNOSIS — C50919 Malignant neoplasm of unspecified site of unspecified female breast: Secondary | ICD-10-CM

## 2019-11-16 DIAGNOSIS — Z6841 Body Mass Index (BMI) 40.0 and over, adult: Secondary | ICD-10-CM | POA: Insufficient documentation

## 2019-11-16 DIAGNOSIS — Z885 Allergy status to narcotic agent status: Secondary | ICD-10-CM | POA: Diagnosis not present

## 2019-11-16 DIAGNOSIS — Z452 Encounter for adjustment and management of vascular access device: Secondary | ICD-10-CM

## 2019-11-16 HISTORY — PX: PORTACATH PLACEMENT: SHX2246

## 2019-11-16 LAB — GLUCOSE, CAPILLARY
Glucose-Capillary: 121 mg/dL — ABNORMAL HIGH (ref 70–99)
Glucose-Capillary: 82 mg/dL (ref 70–99)

## 2019-11-16 SURGERY — INSERTION, TUNNELED CENTRAL VENOUS DEVICE, WITH PORT
Anesthesia: General | Site: Chest | Laterality: Right

## 2019-11-16 MED ORDER — LABETALOL HCL 5 MG/ML IV SOLN
10.0000 mg | INTRAVENOUS | Status: DC | PRN
  Administered 2019-11-16: 15:00:00 10 mg via INTRAVENOUS

## 2019-11-16 MED ORDER — FENTANYL CITRATE (PF) 100 MCG/2ML IJ SOLN
INTRAMUSCULAR | Status: DC | PRN
  Administered 2019-11-16 (×2): 50 ug via INTRAVENOUS

## 2019-11-16 MED ORDER — LACTATED RINGERS IV SOLN: INTRAVENOUS | Status: DC

## 2019-11-16 MED ORDER — BUPIVACAINE HCL (PF) 0.25 % IJ SOLN
INTRAMUSCULAR | Status: AC
  Filled 2019-11-16: qty 30

## 2019-11-16 MED ORDER — PROPOFOL 500 MG/50ML IV EMUL
INTRAVENOUS | Status: AC
  Filled 2019-11-16: qty 50

## 2019-11-16 MED ORDER — FENTANYL CITRATE (PF) 100 MCG/2ML IJ SOLN: 50.0000 ug | INTRAMUSCULAR | Status: DC | PRN

## 2019-11-16 MED ORDER — HEPARIN SOD (PORK) LOCK FLUSH 100 UNIT/ML IV SOLN
INTRAVENOUS | Status: DC | PRN
  Administered 2019-11-16: 450 [IU]

## 2019-11-16 MED ORDER — ONDANSETRON HCL 4 MG/2ML IJ SOLN
INTRAMUSCULAR | Status: AC
  Filled 2019-11-16: qty 6

## 2019-11-16 MED ORDER — LIDOCAINE HCL (CARDIAC) PF 100 MG/5ML IV SOSY
PREFILLED_SYRINGE | INTRAVENOUS | Status: DC | PRN
  Administered 2019-11-16: 60 mg via INTRAVENOUS

## 2019-11-16 MED ORDER — BUPIVACAINE HCL (PF) 0.25 % IJ SOLN
INTRAMUSCULAR | Status: DC | PRN
  Administered 2019-11-16: 10 mL

## 2019-11-16 MED ORDER — CHLORHEXIDINE GLUCONATE CLOTH 2 % EX PADS: 6.0000 | MEDICATED_PAD | Freq: Once | CUTANEOUS | Status: DC

## 2019-11-16 MED ORDER — PROPOFOL 10 MG/ML IV BOLUS
INTRAVENOUS | Status: AC
  Filled 2019-11-16: qty 20

## 2019-11-16 MED ORDER — MIDAZOLAM HCL 5 MG/5ML IJ SOLN
INTRAMUSCULAR | Status: DC | PRN
  Administered 2019-11-16: 2 mg via INTRAVENOUS

## 2019-11-16 MED ORDER — ONDANSETRON HCL 4 MG/2ML IJ SOLN
INTRAMUSCULAR | Status: DC | PRN
  Administered 2019-11-16: 4 mg via INTRAVENOUS

## 2019-11-16 MED ORDER — LIDOCAINE 2% (20 MG/ML) 5 ML SYRINGE
INTRAMUSCULAR | Status: AC
  Filled 2019-11-16: qty 10

## 2019-11-16 MED ORDER — MIDAZOLAM HCL 2 MG/2ML IJ SOLN: 1.0000 mg | INTRAMUSCULAR | Status: DC | PRN

## 2019-11-16 MED ORDER — PHENYLEPHRINE HCL (PRESSORS) 10 MG/ML IV SOLN
INTRAVENOUS | Status: DC | PRN
  Administered 2019-11-16: 80 ug via INTRAVENOUS

## 2019-11-16 MED ORDER — PROPOFOL 10 MG/ML IV BOLUS
INTRAVENOUS | Status: DC | PRN
  Administered 2019-11-16: 200 mg via INTRAVENOUS

## 2019-11-16 MED ORDER — ONDANSETRON HCL 4 MG/2ML IJ SOLN: 4.0000 mg | Freq: Once | INTRAMUSCULAR | Status: DC | PRN

## 2019-11-16 MED ORDER — HEPARIN (PORCINE) IN NACL 1000-0.9 UT/500ML-% IV SOLN
INTRAVENOUS | Status: AC
  Filled 2019-11-16: qty 500

## 2019-11-16 MED ORDER — FENTANYL CITRATE (PF) 100 MCG/2ML IJ SOLN: 25.0000 ug | INTRAMUSCULAR | Status: DC | PRN

## 2019-11-16 MED ORDER — CEFAZOLIN SODIUM-DEXTROSE 2-4 GM/100ML-% IV SOLN: 2.0000 g | INTRAVENOUS | Status: DC

## 2019-11-16 MED ORDER — TRAMADOL HCL 50 MG PO TABS: 50.0000 mg | ORAL_TABLET | Freq: Four times a day (QID) | ORAL | 0 refills | Status: DC | PRN

## 2019-11-16 MED ORDER — HEPARIN (PORCINE) IN NACL 2-0.9 UNITS/ML
INTRAMUSCULAR | Status: AC | PRN
  Administered 2019-11-16: 1 via INTRAVENOUS

## 2019-11-16 MED ORDER — OXYCODONE HCL 5 MG PO TABS: 5.0000 mg | ORAL_TABLET | Freq: Once | ORAL | Status: DC | PRN

## 2019-11-16 MED ORDER — OXYCODONE HCL 5 MG/5ML PO SOLN: 5.0000 mg | Freq: Once | ORAL | Status: DC | PRN

## 2019-11-16 MED ORDER — CEFAZOLIN SODIUM-DEXTROSE 2-4 GM/100ML-% IV SOLN
INTRAVENOUS | Status: AC
  Filled 2019-11-16: qty 100

## 2019-11-16 MED ORDER — LABETALOL HCL 5 MG/ML IV SOLN
INTRAVENOUS | Status: AC
  Filled 2019-11-16: qty 4

## 2019-11-16 MED ORDER — HEPARIN SOD (PORK) LOCK FLUSH 100 UNIT/ML IV SOLN
INTRAVENOUS | Status: AC
  Filled 2019-11-16: qty 5

## 2019-11-16 MED ORDER — FENTANYL CITRATE (PF) 100 MCG/2ML IJ SOLN
INTRAMUSCULAR | Status: AC
  Filled 2019-11-16: qty 2

## 2019-11-16 MED ORDER — DEXAMETHASONE SODIUM PHOSPHATE 4 MG/ML IJ SOLN
INTRAMUSCULAR | Status: DC | PRN
  Administered 2019-11-16: 4 mg via INTRAVENOUS

## 2019-11-16 MED ORDER — MIDAZOLAM HCL 2 MG/2ML IJ SOLN
INTRAMUSCULAR | Status: AC
  Filled 2019-11-16: qty 2

## 2019-11-16 SURGICAL SUPPLY — 54 items
APL PRP STRL LF DISP 70% ISPRP (MISCELLANEOUS) ×1
APL SKNCLS STERI-STRIP NONHPOA (GAUZE/BANDAGES/DRESSINGS) ×1
BAG DECANTER FOR FLEXI CONT (MISCELLANEOUS) ×2 IMPLANT
BENZOIN TINCTURE PRP APPL 2/3 (GAUZE/BANDAGES/DRESSINGS) ×2 IMPLANT
BLADE SURG 11 STRL SS (BLADE) ×2 IMPLANT
BLADE SURG 15 STRL LF DISP TIS (BLADE) ×1 IMPLANT
BLADE SURG 15 STRL SS (BLADE) ×2
CANISTER SUCT 1200ML W/VALVE (MISCELLANEOUS) IMPLANT
CHLORAPREP W/TINT 26 (MISCELLANEOUS) ×2 IMPLANT
CLEANER CAUTERY TIP 5X5 PAD (MISCELLANEOUS) ×1 IMPLANT
COVER BACK TABLE 60X90IN (DRAPES) ×2 IMPLANT
COVER MAYO STAND STRL (DRAPES) ×2 IMPLANT
COVER PROBE 5X48 (MISCELLANEOUS)
COVER WAND RF STERILE (DRAPES) IMPLANT
DECANTER SPIKE VIAL GLASS SM (MISCELLANEOUS) ×2 IMPLANT
DRAPE C-ARM 42X72 X-RAY (DRAPES) ×2 IMPLANT
DRAPE LAPAROTOMY TRNSV 102X78 (DRAPES) ×2 IMPLANT
DRAPE UTILITY XL STRL (DRAPES) ×2 IMPLANT
DRSG TEGADERM 4X4.75 (GAUZE/BANDAGES/DRESSINGS) ×2 IMPLANT
ELECT REM PT RETURN 9FT ADLT (ELECTROSURGICAL) ×2
ELECTRODE REM PT RTRN 9FT ADLT (ELECTROSURGICAL) ×1 IMPLANT
GAUZE SPONGE 4X4 12PLY STRL LF (GAUZE/BANDAGES/DRESSINGS) IMPLANT
GLOVE BIO SURGEON STRL SZ7 (GLOVE) ×2 IMPLANT
GLOVE BIOGEL PI IND STRL 7.0 (GLOVE) IMPLANT
GLOVE BIOGEL PI IND STRL 7.5 (GLOVE) ×1 IMPLANT
GLOVE BIOGEL PI INDICATOR 7.0 (GLOVE) ×2
GLOVE BIOGEL PI INDICATOR 7.5 (GLOVE) ×1
GLOVE ECLIPSE 6.5 STRL STRAW (GLOVE) ×1 IMPLANT
GOWN STRL REUS W/ TWL LRG LVL3 (GOWN DISPOSABLE) ×1 IMPLANT
GOWN STRL REUS W/TWL LRG LVL3 (GOWN DISPOSABLE) ×2
IV KIT MINILOC 20X1 SAFETY (NEEDLE) IMPLANT
KIT CVR 48X5XPRB PLUP LF (MISCELLANEOUS) IMPLANT
KIT PORT POWER 8FR ISP CVUE (Port) ×1 IMPLANT
NDL HYPO 25X1 1.5 SAFETY (NEEDLE) ×1 IMPLANT
NDL SAFETY ECLIPSE 18X1.5 (NEEDLE) IMPLANT
NDL SPNL 22GX3.5 QUINCKE BK (NEEDLE) IMPLANT
NEEDLE HYPO 18GX1.5 SHARP (NEEDLE)
NEEDLE HYPO 25X1 1.5 SAFETY (NEEDLE) ×2 IMPLANT
NEEDLE SPNL 22GX3.5 QUINCKE BK (NEEDLE) IMPLANT
PACK BASIN DAY SURGERY FS (CUSTOM PROCEDURE TRAY) ×2 IMPLANT
PAD CLEANER CAUTERY TIP 5X5 (MISCELLANEOUS) ×1
PENCIL SMOKE EVACUATOR (MISCELLANEOUS) ×2 IMPLANT
SLEEVE SCD COMPRESS KNEE MED (MISCELLANEOUS) ×2 IMPLANT
SPONGE GAUZE 2X2 8PLY STRL LF (GAUZE/BANDAGES/DRESSINGS) ×2 IMPLANT
STRIP CLOSURE SKIN 1/2X4 (GAUZE/BANDAGES/DRESSINGS) ×2 IMPLANT
SUT MON AB 4-0 PC3 18 (SUTURE) ×2 IMPLANT
SUT PROLENE 2 0 CT2 30 (SUTURE) ×2 IMPLANT
SUT VIC AB 3-0 SH 27 (SUTURE) ×2
SUT VIC AB 3-0 SH 27X BRD (SUTURE) ×1 IMPLANT
SYR 5ML LUER SLIP (SYRINGE) ×2 IMPLANT
SYR CONTROL 10ML LL (SYRINGE) ×2 IMPLANT
TOWEL GREEN STERILE FF (TOWEL DISPOSABLE) ×2 IMPLANT
TUBE CONNECTING 20X1/4 (TUBING) IMPLANT
YANKAUER SUCT BULB TIP NO VENT (SUCTIONS) IMPLANT

## 2019-11-16 NOTE — Anesthesia Postprocedure Evaluation (Signed)
Anesthesia Post Note  Patient: [name]  Procedure(s) Performed: INSERTION PORT-A-CATH WITH ULTRASOUND (Right Chest)     Patient location during evaluation: PACU Anesthesia Type: General Level of consciousness: awake and alert Pain management: pain level controlled Vital Signs Assessment: post-procedure vital signs reviewed and stable Respiratory status: spontaneous breathing, nonlabored ventilation and respiratory function stable Cardiovascular status: blood pressure returned to baseline and stable Postop Assessment: no apparent nausea or vomiting Anesthetic complications: no    Last Vitals:  Vitals:   11/16/19 1530 11/16/19 1536  BP:  (!) 147/96  Pulse:  70  Resp:  15  Temp:  36.5 C  SpO2: 100% 100%    Last Pain:  Vitals:   11/16/19 1536  TempSrc: Oral  PainSc: 3                  Lidia Collum

## 2019-11-16 NOTE — Anesthesia Procedure Notes (Signed)
Procedure Name: LMA Insertion Date/Time: 11/16/2019 1:23 PM Performed by: Signe Colt, CRNA Pre-anesthesia Checklist: Patient identified, Emergency Drugs available, Suction available and Patient being monitored Patient Re-evaluated:Patient Re-evaluated prior to induction Oxygen Delivery Method: Circle system utilized Preoxygenation: Pre-oxygenation with 100% oxygen Induction Type: IV induction Ventilation: Mask ventilation without difficulty LMA: LMA inserted LMA Size: 4.0 Number of attempts: 1 Airway Equipment and Method: Bite block Placement Confirmation: positive ETCO2 Tube secured with: Tape Dental Injury: Teeth and Oropharynx as per pre-operative assessment

## 2019-11-16 NOTE — Transfer of Care (Signed)
Immediate Anesthesia Transfer of Care Note  Patient: [name]  Procedure(s) Performed: INSERTION PORT-A-CATH WITH ULTRASOUND (N/A Chest)  Patient Location: PACU  Anesthesia Type:General  Level of Consciousness: awake, alert , oriented and patient cooperative  Airway & Oxygen Therapy: Patient Spontanous Breathing and Patient connected to face mask oxygen  Post-op Assessment: Report given to RN and Post -op Vital signs reviewed and stable  Post vital signs: Reviewed and stable  Last Vitals:  Vitals Value Taken Time  BP    Temp    Pulse    Resp    SpO2      Last Pain:  Vitals:   11/16/19 1138  TempSrc: Tympanic  PainSc: 3       Patients Stated Pain Goal: 4 ([id number])  Complications: No apparent anesthesia complications

## 2019-11-16 NOTE — Discharge Instructions (Signed)
PORT-A-CATH: POST OP INSTRUCTIONS  Always review your discharge instruction sheet given to you by the facility where your surgery was performed.   1. A prescription for pain medication may be given to you upon discharge. Take your pain medication as prescribed, if needed. If narcotic pain medicine is not needed, then you make take acetaminophen (Tylenol) or ibuprofen (Advil) as needed.  2. Take your usually prescribed medications unless otherwise directed. 3. If you need a refill on your pain medication, please contact our office. All narcotic pain medicine now requires a paper prescription.  Phoned in and fax refills are no longer allowed by law.  Prescriptions will not be filled after 5 pm or on weekends.  4. You should follow a light diet for the remainder of the day after your procedure. 5. Most patients will experience some mild swelling and/or bruising in the area of the incision. It may take several days to resolve. 6. It is common to experience some constipation if taking pain medication after surgery. Increasing fluid intake and taking a stool softener (such as Colace) will usually help or prevent this problem from occurring. A mild laxative (Milk of Magnesia or Miralax) should be taken according to package directions if there are no bowel movements after 48 hours.  7. Unless discharge instructions indicate otherwise, you may remove your bandages 48 hours after surgery, and you may shower at that time. You may have steri-strips (small white skin tapes) in place directly over the incision.  These strips should be left on the skin for 7-10 days.  If your surgeon used Dermabond (skin glue) on the incision, you may shower in 24 hours.  The glue will flake off over the next 2-3 weeks.  8. If your port is left accessed at the end of surgery (needle left in port), the dressing cannot get wet and should only by changed by a healthcare professional. When the port is no longer accessed (when the  needle has been removed), follow step 7.   9. ACTIVITIES:  Limit activity involving your arms for the next 72 hours. Do no strenuous exercise or activity for 1 week. You may drive when you are no longer taking prescription pain medication, you can comfortably wear a seatbelt, and you can maneuver your car. 10.You may need to see your doctor in the office for a follow-up appointment.  Please       check with your doctor.  11.When you receive a new Port-a-Cath, you will get a product guide and        ID card.  Please keep them in case you need them.  WHEN TO CALL YOUR DOCTOR [phone]): 1. Fever over 101.0 2. Chills 3. Continued bleeding from incision 4. Increased redness and tenderness at the site 5. Shortness of breath, difficulty breathing   The clinic staff is available to answer your questions during regular business hours. Please don't hesitate to call and ask to speak to one of the nurses or medical assistants for clinical concerns. If you have a medical emergency, go to the nearest emergency room or call 911.  A surgeon from [facility] is always on call at the hospital.     For further information, please visit [email or web address]        Post Anesthesia Home Care Instructions  Activity: Get plenty of rest for the remainder of the day. A responsible individual must stay with you for 24 hours following the procedure.  For the next 24 hours,  DO NOT: -Drive a car -Paediatric nurse -Drink alcoholic beverages -Take any medication unless instructed by your physician -Make any legal decisions or sign important papers.  Meals: Start with liquid foods such as gelatin or soup. Progress to regular foods as tolerated. Avoid greasy, spicy, heavy foods. If nausea and/or vomiting occur, drink only clear liquids until the nausea and/or vomiting subsides. Call your physician if vomiting continues.  Special Instructions/Symptoms: Your throat may feel dry or  sore from the anesthesia or the breathing tube placed in your throat during surgery. If this causes discomfort, gargle with warm salt water. The discomfort should disappear within 24 hours.  If you had a scopolamine patch placed behind your ear for the management of post- operative nausea and/or vomiting:  1. The medication in the patch is effective for 72 hours, after which it should be removed.  Wrap patch in a tissue and discard in the trash. Wash hands thoroughly with soap and water. 2. You may remove the patch earlier than 72 hours if you experience unpleasant side effects which may include dry mouth, dizziness or visual disturbances. 3. Avoid touching the patch. Wash your hands with soap and water after contact with the patch.      Call your surgeon if you experience:   1.  Fever over 101.0. 2.  Inability to urinate. 3.  Nausea and/or vomiting. 4.  Extreme swelling or bruising at the surgical site. 5.  Continued bleeding from the incision. 6.  Increased pain, redness or drainage from the incision. 7.  Problems related to your pain medication. 8.  Any problems and/or concerns

## 2019-11-16 NOTE — Progress Notes (Signed)
TASHI, SHOBER ([id number]) Visit Report for 11/16/2019 HPI Details Patient Name: [name], [name]. Date of Service: 11/16/2019 9:15 AM Medical Record [id number] Patient Account Number: [id number] Date of Birth/Sex: Treating RN: [birth year] (64 y.o. F) Primary Care Provider: Cathlean Cower Other Clinician: Referring Provider: Treating Provider/Extender:Berenis Corter, Arta Bruce, Casper Harrison in Treatment: 2 History of Present Illness HPI Description: 11/02/2019 ADMISSION This is a 64 year old woman who has a wound on the right posterior Achilles area towards the distal end of the Achilles tendon surgery scars. She tells me that roughly 2-1/2 to 3 weeks ago she was picking at some dry skin in the area pulled it off and that is how the wound opened up. She had an original spontaneous Achilles tendon rupture in the early 1990s that was repaired and then very shortly thereafter it reruptured and she required another surgery. She has not had any problems with this since then. The wound itself currently has episodic sharp pain some swelling. She was put on Augmentin by her primary physician on 10/30/2019 for possible coexistent cellulitis. She used hydrogen peroxide for a while with topical antibiotics. Mostly she has been using topical antibiotics as somebody told her that hydrogen peroxide was not good to use. 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 Surgery was in [place], bilateral total knee replacements. ABIs in our clinic on the right were 1.09 1/21; patient here with a linear wound in the right Achilles area. Her wrap fell down she did not call us to replace it. She has questions about her compression  wrap necessity. Finally she asked if the wound could be sutured The patient is under a lot of pressure with initiation of chemotherapy she is going to have a port placed etc. 1/28; linear wound in the right Achilles area in the setting of previous surgery. She is going for a port for chemotherapy for her breast cancer. She has apparently made herself an appointment with orthopedic surgery. I am not sure who she has made an appointment with at this point. Electronic Signature(s) Signed: 11/16/2019 5:54:40 PM By: Linton Ham MD Entered By: Linton Ham on 11/16/2019 10:21:07 -------------------------------------------------------------------------------- Physical Exam Details Patient Name: [name] Date of Service: 11/16/2019 9:15 AM Medical Record [id number] Patient Account Number: [id number] Date of Birth/Sex: Treating RN: [birth year] (64 y.o. F) Primary Care Provider: Cathlean Cower Other Clinician: Referring Provider: Treating Provider/Extender:Seema Blum, Arta Bruce, Casper Harrison in Treatment: 2 Constitutional Patient is hypertensive.. Pulse regular and within target range for patient.[name] Respirations regular, non-labored and within target range.. Temperature is normal and within the target range for the patient.[name] Appears in no distress. Respiratory work of breathing is normal. Cardiovascular Pedal pulses palpable. Psychiatric Patient appears depressed today.. Notes Wound exam; small caudal linear area in the lower part of her Achilles the tendon surgical scar. There is no debris on the surface which is better than last time. No debridement was required. There is no evidence of infection although there is inflammation probably related to chronic venous disease and inflammation around the large area of this part of her lower leg posteriorly Electronic Signature(s) Signed: 11/16/2019 5:54:40 PM By: Linton Ham MD Entered By: Linton Ham on 11/16/2019  10:22:54 -------------------------------------------------------------------------------- Physician Orders Details Patient Name: [name]  Date of Service: 11/16/2019 9:15 AM Medical Record [id number] Patient Account Number: [id number] Date of Birth/Sex: Treating RN: February 13, 1956 (64 y.o. Helene Shoe, Meta.Reding Primary Care Provider: Cathlean Cower Other Clinician: Referring Provider: Treating Provider/Extender:Marissa Lowrey, Arta Bruce, Casper Harrison in Treatment: 2 Verbal / Phone Orders: No Diagnosis Coding ICD-10 Coding Code Description [phone] Non-pressure chronic ulcer of right ankle with fat layer exposed Disruption of external operation (surgical) wound, not elsewhere classified, subsequent T81.31XD encounter I87.311 Chronic venous hypertension (idiopathic) with ulcer of right lower extremity E11.622 Type 2 diabetes mellitus with other skin ulcer Follow-up Appointments Return Appointment in 1 week. - Thursday Dressing Change Frequency Wound #1 Right Achilles Do not change entire dressing for one week. - do not remove compression wrap until stockings arrive. Then apply stockings in the morning and remove at night. Skin Barriers/Peri-Wound Care Wound #1 Right Achilles Moisturizing lotion Wound Cleansing Wound #1 Right Achilles May shower with protection. - May use cast protector Primary Wound Dressing Wound #1 Right Achilles Hydrofera Blue Secondary Dressing Wound #1 Right Achilles Foam Border - or large bandaid. Edema Control Kerlix and Coban - Right Lower Extremity - apply lightly and use unna boot first layer to aid in holding wrap in place. Avoid standing for long periods of time Elevate legs to the level of the heart or above for 30 minutes daily and/or when sitting, a frequency of: - throughout the day Exercise regularly Support Garment 20-30 mm/Hg pressure to: - patient to order compression stockings from [employer]. Once arrive remove compression  wrap, get into shower , wash wound, and apply your compression stocking in the morning and remove at night. Electronic Signature(s) Signed: 11/16/2019 5:54:40 PM By: Linton Ham MD Signed: 11/16/2019 6:05:55 PM By: Deon Pilling Entered By: Deon Pilling on 11/16/2019 10:01:41 -------------------------------------------------------------------------------- Problem List Details Patient Name: [name] Date of Service: 11/16/2019 9:15 AM Medical Record [id number] Patient Account Number: [id number] Date of Birth/Sex: Treating RN: Nov 30, 1955 (64 y.o. [name] Primary Care Provider: Cathlean Cower Other Clinician: Referring Provider: Treating Provider/Extender:Kalep Full, Arta Bruce, Casper Harrison in Treatment: 2 Active Problems ICD-10 Evaluated Encounter Code Description Active Date Today Diagnosis L97.312 Non-pressure chronic ulcer of right ankle with fat layer 11/02/2019 No Yes exposed T81.31XD Disruption of external operation (surgical) wound, not 11/02/2019 No Yes elsewhere classified, subsequent encounter I87.311 Chronic venous hypertension (idiopathic) with ulcer of 11/02/2019 No Yes right lower extremity E11.622 Type 2 diabetes mellitus with other skin ulcer 11/02/2019 No Yes Inactive Problems Resolved Problems Electronic Signature(s) Signed: 11/16/2019 5:54:40 PM By: Linton Ham MD Entered By: Linton Ham on 11/16/2019 10:18:10 -------------------------------------------------------------------------------- Progress Note Details Patient Name: [name] Date of Service: 11/16/2019 9:15 AM Medical Record [id number] Patient Account Number: [id number] Date of Birth/Sex: Treating RN: 1956/10/02 (64 y.o. F) Primary Care Provider: Cathlean Cower Other Clinician: Referring Provider: Treating Provider/Extender:Nur Rabold, Arta Bruce, Casper Harrison in Treatment: 2 Subjective History of Present Illness (HPI) 11/02/2019 ADMISSION This is a  64 year old woman who has a wound on the right posterior Achilles area towards the distal end of the Achilles tendon surgery scars. She tells me that roughly 2-1/2 to 3 weeks ago she was picking at some dry skin in the area pulled it off and that is how the wound opened up. She had an original spontaneous Achilles tendon rupture in the early 1990s that was repaired and then very shortly thereafter it reruptured and she required another surgery. She has not had any problems with this since then. The wound itself currently has episodic  sharp pain some swelling. She was put on Augmentin by her primary physician on 10/30/2019 for possible coexistent cellulitis. She used hydrogen peroxide for a while with topical antibiotics. Mostly she has been using topical antibiotics as somebody told her that hydrogen peroxide was not good to use. 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 Surgery was in [place], bilateral total knee replacements. ABIs in our clinic on the right were 1.09 1/21; patient here with a linear wound in the right Achilles area. Her wrap fell down she did not call us to replace it. She has questions about her compression wrap necessity. Finally she asked if the wound could be sutured The patient is under a lot of pressure with initiation of chemotherapy she is going to have a port placed etc. 1/28; linear wound in the right Achilles area in the setting of previous surgery. She is going for a port for chemotherapy for her breast cancer. She has apparently made herself an appointment with orthopedic surgery. I am not sure who she has made an appointment with at this point. Objective Constitutional Patient is  hypertensive.. Pulse regular and within target range for patient.[name] Respirations regular, non-labored and within target range.. Temperature is normal and within the target range for the patient.[name] Appears in no distress. Vitals Time Taken: 9:32 AM, Height: 59 in, Source: Stated, Weight: 250 lbs, Source: Stated, BMI: 50.5, Temperature: 98.2 F, Pulse: 82 bpm, Respiratory Rate: 18 breaths/min, Blood Pressure: 139/93 mmHg. General Notes: pt did not check blood sugar today or yesterday Respiratory work of breathing is normal. Cardiovascular Pedal pulses palpable. Psychiatric Patient appears depressed today.. General Notes: Wound exam; small caudal linear area in the lower part of her Achilles the tendon surgical scar. There is no debris on the surface which is better than last time. No debridement was required. There is no evidence of infection although there is inflammation probably related to chronic venous disease and inflammation around the large area of this part of her lower leg posteriorly Integumentary (Hair, Skin) Wound #1 status is Open. Original cause of wound was Trauma. The wound is located on the Right Achilles. The wound measures 2.3cm length x 0.5cm width x 0.1cm depth; 0.903cm^2 area and 0.09cm^3 volume. There is Fat Layer (Subcutaneous Tissue) Exposed exposed. There is no tunneling or undermining noted. There is a small amount of serosanguineous drainage noted. The wound margin is epibole. There is medium (34-66%) red granulation within the wound bed. There is a medium (34-66%) amount of necrotic tissue within the wound bed including Adherent Slough. Assessment Active Problems ICD-10 Non-pressure chronic ulcer of right ankle with fat layer exposed Disruption of external operation (surgical) wound, not elsewhere classified, subsequent encounter Chronic venous hypertension (idiopathic) with ulcer of right lower extremity Type 2 diabetes mellitus with other skin  ulcer Plan Follow-up Appointments: Return Appointment in 1 week. - Thursday Dressing Change Frequency: Wound #1 Right Achilles: Do not change entire dressing for one week. - do not remove compression wrap until stockings arrive. Then apply stockings in the morning and remove at night. Skin Barriers/Peri-Wound Care: Wound #1 Right Achilles: Moisturizing lotion Wound Cleansing: Wound #1 Right Achilles: May shower with protection. - May use cast protector Primary Wound Dressing: Wound #1 Right  Achilles: Hydrofera Blue Secondary Dressing: Wound #1 Right Achilles: Foam Border - or large bandaid. Edema Control: Kerlix and Coban - Right Lower Extremity - apply lightly and use unna boot first layer to aid in holding wrap in place. Avoid standing for long periods of time Elevate legs to the level of the heart or above for 30 minutes daily and/or when sitting, a frequency of: - throughout the day Exercise regularly Support Garment 20-30 mm/Hg pressure to: - patient to order compression stockings from [employer]. Once arrive remove compression wrap, get into shower , wash wound, and apply your compression stocking in the morning and remove at night. 1. Right Achilles wound. The patient took the wrap off last week said she found it too painful she put TED hose on. We were using Iodoflex that may have been some issue with regards to the pain although she says it went up and down her whole leg. She has some degree of diabetic neuropathy but I do not think it is that much. 2. In view of #1 I changed her dressing to [facility] and put her under a lighter kerlix and Coban wrap. We gave her measurements to get 20/30 compression stockings below-knee 3. In view of her cancer chemotherapy I am not sure how much effort she wants to make in closing this area. I will send a text message to her oncologist to let him know about the open wound although at this point I am not sure which chemotherapy  is planned Electronic Signature(s) Signed: 11/16/2019 5:54:40 PM By: Linton Ham MD Entered By: Linton Ham on 11/16/2019 10:28:34 -------------------------------------------------------------------------------- SuperBill Details Patient Name: Date of Service: [name] 11/16/2019 Medical Record [id number] Patient Account Number: [id number] Date of Birth/Sex: Treating RN: [birth year] (64 y.o. Helene Shoe, Meta.Reding Primary Care Provider: Cathlean Cower Other Clinician: Referring Provider: Treating Provider/Extender:Elmira Olkowski, Arta Bruce, Casper Harrison in Treatment: 2 Diagnosis Coding ICD-10 Codes Code Description [phone] Non-pressure chronic ulcer of right ankle with fat layer exposed Disruption of external operation (surgical) wound, not elsewhere classified, subsequent T81.31XD encounter I87.311 Chronic venous hypertension (idiopathic) with ulcer of right lower extremity E11.622 Type 2 diabetes mellitus with other skin ulcer Facility Procedures CPT4 Code: [id number] Description: 99214 - WOUND CARE VISIT-LEV 4 EST PT Modifier: Quantity: 1 Physician Procedures CPT4: Code [id number] Description: 213 - WC PHYS LEVEL 3 - EST PT ICD-10 Diagnosis Description L97.312 Non-pressure chronic ulcer of right ankle with fat layer T81.31XD Disruption of external operation (surgical) wound, not el subsequent encounter Modifier: exposed sewhere classifi Quantity: 1 ed, Electronic Signature(s) Signed: 11/16/2019 5:54:40 PM By: Linton Ham MD Entered By: Linton Ham on 11/16/2019 10:28:51
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
## 2019-11-16 NOTE — Anesthesia Preprocedure Evaluation (Addendum)
Anesthesia Evaluation  Patient identified by MRN, date of birth, ID band Patient awake    Reviewed: Allergy & Precautions, NPO status , Patient's Chart, lab work & pertinent test results  History of Anesthesia Complications Negative for: history of anesthetic complications  Airway Mallampati: II  TM Distance: >3 FB Neck ROM: Full    Dental  (+) Teeth Intact   Pulmonary sleep apnea ,    Pulmonary exam normal        Cardiovascular hypertension, negative cardio ROS Normal cardiovascular exam     Neuro/Psych PSYCHIATRIC DISORDERS Anxiety Depression negative neurological ROS     GI/Hepatic Neg liver ROS, hiatal hernia, GERD  ,  Endo/Other  diabetesMorbid obesity  Renal/GU negative Renal ROS  negative genitourinary   Musculoskeletal negative musculoskeletal ROS (+)   Abdominal (+) + obese,   Peds  Hematology negative hematology ROS (+)   Anesthesia Other Findings Metastatic breast cancer  Reproductive/Obstetrics                            Anesthesia Physical Anesthesia Plan  ASA: III  Anesthesia Plan: General   Post-op Pain Management:    Induction: Intravenous  PONV Risk Score and Plan: 3 and Ondansetron, Dexamethasone, Treatment may vary due to age or medical condition and Midazolam  Airway Management Planned: LMA  Additional Equipment: None  Intra-op Plan:   Post-operative Plan: Extubation in OR  Informed Consent: I have reviewed the patients History and Physical, chart, labs and discussed the procedure including the risks, benefits and alternatives for the proposed anesthesia with the patient or authorized representative who has indicated his/her understanding and acceptance.     Dental advisory given  Plan Discussed with:   Anesthesia Plan Comments:        Anesthesia Quick Evaluation

## 2019-11-16 NOTE — Op Note (Signed)
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  She is scheduled to begin neoadjuvant chemotherapy and presents now for port placement.  Description of procedure: The patient is brought to the operating room and placed in the supine position on the operating room table.  Her arms were tucked at her sides.  After an adequate level of general anesthesia was obtained her right neck and chest were prepped with ChloraPrep and draped sterile fashion.  A timeout was taken to ensure the proper patient and proper procedure.  I interrogated her neck and identified the right internal jugular vein as well as the right carotid artery.  Under direct ultrasound guidance I used an 18-gauge needle to cannulate the jugular vein.  A wire was passed through the needle and advanced down into the mediastinum under fluoroscopic guidance.  The needle was removed.  I created a subcutaneous pocket below the right clavicle after infiltrating with local anesthetic.  I excised some of the subcutaneous adipose tissue that will be anterior to the port.  We then created a subcutaneous tunnel between the insertion site on the neck and the subcutaneous pocket on the right chest.  An 8 French [place] port was assembled and was tunneled from the subcutaneous pocket up to the neck.  I used fluoroscopy to estimate the appropriate length of the catheter and this was cut to the appropriate length.  We then passed the dilator and breakaway sheath over the wire under fluoroscopic guidance.  The wire and dilator were removed.  The catheter was advanced through the breakaway sheath which was then removed.  We were able to aspirate blood freely from the port and were able to  flush easily.  No kinks were seen along the length of the catheter under fluoroscopic examination.  I instilled concentrated heparin solution into the port.  The wound was closed with a deep layer of 3-0 Vicryl.  4-0 Monocryl was used to close the skin incisions.  Benzoin Steri-Strips were applied.  A clean dressing was applied.  The patient was then extubated and brought to the recovery room in stable condition.  All sponge, instrument, and needle counts are correct.  [name]. [name], MD, [facility] Surgery  General/ Trauma Surgery   11/16/2019 3:49 PM

## 2019-11-17 ENCOUNTER — Other Ambulatory Visit: Payer: 59

## 2019-11-17 ENCOUNTER — Encounter: Payer: Self-pay | Admitting: *Deleted

## 2019-11-17 NOTE — Progress Notes (Addendum)
MCKENZY, BECERRIL ([id number]) Visit Report for 11/16/2019 Arrival Information Details Patient Name: Date of Service: [name], [name] 11/16/2019 9:15 AM Medical Record [id number] Patient Account Number: [id number] Date of Birth/Sex: Treating RN: 08/21/1956 (64 y.o. Martyn Malay, Vaughan Basta Primary Care Ab Leaming: Cathlean Cower Other Clinician: Referring Jorey Dollard: Treating Khailee Mick/Extender:Robson, Arta Bruce, Casper Harrison in Treatment: 2 Visit Information History Since Last Visit Added or deleted any medications: No Patient Arrived: Ambulatory Any new allergies or adverse reactions: No Arrival Time: 09:31 Had a fall or experienced change in No Accompanied By: self activities of daily living that may affect Transfer Assistance: None risk of falls: Patient Identification Verified: Yes Signs or symptoms of abuse/neglect since last No Secondary Verification Process Completed: Yes visito Patient Requires Transmission-Based No Hospitalized since last visit: No Precautions: Implantable device outside of the clinic excluding No Patient Has Alerts: No cellular tissue based products placed in the center since last visit: Has Dressing in Place as Prescribed: Yes Has Compression in Place as Prescribed: No Pain Present Now: Yes Electronic Signature(s) Signed: 11/17/2019 8:33:08 AM By: Baruch Gouty RN, BSN Entered By: Baruch Gouty on 11/16/2019 09:32:49 -------------------------------------------------------------------------------- Clinic Level of Care Assessment Details Patient Name: Date of Service: [name], [name] 11/16/2019 9:15 AM Medical Record [id number] Patient Account Number: [id number] Date of Birth/Sex: Treating RN: Feb 19, 1956 (64 y.o. Helene Shoe, Meta.Reding Primary Care Deivi Huckins: Cathlean Cower Other Clinician: Referring Tanza Pellot: Treating Alyxandria Wentz/Extender:Robson, Arta Bruce, Casper Harrison in Treatment: 2 Clinic Level of Care Assessment Items TOOL 4  Quantity Score X - Use when only an EandM is performed on FOLLOW-UP visit 1 0 ASSESSMENTS - Nursing Assessment / Reassessment X - Reassessment of Co-morbidities (includes updates in patient status) 1 10 X - Reassessment of Adherence to Treatment Plan 1 5 ASSESSMENTS - Wound and Skin Assessment / Reassessment X - Simple Wound Assessment / Reassessment - one wound 1 5 []  - Complex Wound Assessment / Reassessment - multiple wounds 0 X - Dermatologic / Skin Assessment (not related to wound area) 1 10 ASSESSMENTS - Focused Assessment X - Circumferential Edema Measurements - multi extremities 1 5 X - Nutritional Assessment / Counseling / Intervention 1 10 []  - Lower Extremity Assessment (monofilament, tuning fork, pulses) 0 []  - Peripheral Arterial Disease Assessment (using hand held doppler) 0 ASSESSMENTS - Ostomy and/or Continence Assessment and Care []  - Incontinence Assessment and Management 0 []  - Ostomy Care Assessment and Management (repouching, etc.) 0 PROCESS - Coordination of Care X - Simple Patient / Family Education for ongoing care 1 15 []  - Complex (extensive) Patient / Family Education for ongoing care 0 X - Staff obtains Programmer, systems, Records, Test Results / Process Orders 1 10 []  - Staff telephones HHA, Nursing Homes / Clarify orders / etc 0 []  - Routine Transfer to another Facility (non-emergent condition) 0 []  - Routine Hospital Admission (non-emergent condition) 0 []  - New Admissions / Biomedical engineer / Ordering NPWT, Apligraf, etc. 0 []  - Emergency Hospital Admission (emergent condition) 0 X - Simple Discharge Coordination 1 10 []  - Complex (extensive) Discharge Coordination 0 PROCESS - Special Needs []  - Pediatric / Minor Patient Management 0 []  - Isolation Patient Management 0 []  - Hearing / Language / Visual special needs 0 []  - Assessment of Community assistance (transportation, D/C planning, etc.) 0 []  - Additional assistance / Altered mentation 0 []  -  Support Surface(s) Assessment (bed, cushion, seat, etc.) 0 INTERVENTIONS - Wound Cleansing / Measurement X - Simple Wound Cleansing - one wound 1 5 []  - Complex  Wound Cleansing - multiple wounds 0 X - Wound Imaging (photographs - any number of wounds) 1 5 []  - Wound Tracing (instead of photographs) 0 X - Simple Wound Measurement - one wound 1 5 []  - Complex Wound Measurement - multiple wounds 0 INTERVENTIONS - Wound Dressings []  - Small Wound Dressing one or multiple wounds 0 []  - Medium Wound Dressing one or multiple wounds 0 X - Large Wound Dressing one or multiple wounds 1 20 []  - Application of Medications - topical 0 []  - Application of Medications - injection 0 INTERVENTIONS - Miscellaneous []  - External ear exam 0 []  - Specimen Collection (cultures, biopsies, blood, body fluids, etc.) 0 []  - Specimen(s) / Culture(s) sent or taken to Lab for analysis 0 []  - Patient Transfer (multiple staff / Civil Service fast streamer / Similar devices) 0 []  - Simple Staple / Suture removal (25 or less) 0 []  - Complex Staple / Suture removal (26 or more) 0 []  - Hypo / Hyperglycemic Management (close monitor of Blood Glucose) 0 []  - Ankle / Brachial Index (ABI) - do not check if billed separately 0 X - Vital Signs 1 5 Has the patient been seen at the hospital within the last three years: Yes Total Score: 120 Level Of Care: New/Established - Level 4 Electronic Signature(s) Signed: 11/16/2019 6:05:55 PM By: Deon Pilling Entered By: Deon Pilling on 11/16/2019 10:02:28 -------------------------------------------------------------------------------- Encounter Discharge Information Details Patient Name: Date of Service: [name] 11/16/2019 9:15 AM Medical Record [id number] Patient Account Number: [id number] Date of Birth/Sex: Treating RN: Dec 16, 1955 (64 y.o. Elam Dutch Primary Care Rockford Leinen: Cathlean Cower Other Clinician: Referring Damaso Laday: Treating Taia Bramlett/Extender:Robson,  Arta Bruce, Casper Harrison in Treatment: 2 Encounter Discharge Information Items Discharge Condition: Stable Ambulatory Status: Ambulatory Discharge Destination: Home Transportation: Private Auto Accompanied By: self Schedule Follow-up Appointment: Yes Clinical Summary of Care: Patient Declined Electronic Signature(s) Signed: 11/17/2019 8:33:08 AM By: Baruch Gouty RN, BSN Entered By: Baruch Gouty on 11/16/2019 10:26:05 -------------------------------------------------------------------------------- Lower Extremity Assessment Details Patient Name: Date of Service: [name] 11/16/2019 9:15 AM Medical Record [id number] Patient Account Number: [id number] Date of Birth/Sex: Treating RN: 1956-09-21 (63 y.o. Elam Dutch Primary Care Virgie Chery: Cathlean Cower Other Clinician: Referring Tayanna Talford: Treating Allene Furuya/Extender:Robson, Arta Bruce, Casper Harrison in Treatment: 2 Edema Assessment Assessed: [Left: No] [Right: No] Edema: [Left: Ye] [Right: s] Calf Left: Right: Point of Measurement: cm From Medial Instep cm 42.1 cm Ankle Left: Right: Point of Measurement: cm From Medial Instep cm 27.4 cm Vascular Assessment Pulses: Dorsalis Pedis Palpable: [Right:Yes] Electronic Signature(s) Signed: 11/17/2019 8:33:08 AM By: Baruch Gouty RN, BSN Entered By: Baruch Gouty on 11/16/2019 09:39:54 -------------------------------------------------------------------------------- Multi Wound Chart Details Patient Name: Date of Service: [name] 11/16/2019 9:15 AM Medical Record [id number] Patient Account Number: [id number] Date of Birth/Sex: Treating RN: 11/09/1955 (64 y.o. F) Primary Care Ellakate Gonsalves: Cathlean Cower Other Clinician: Referring Serafina Topham: Treating Tenaya Hilyer/Extender:Robson, Arta Bruce, Casper Harrison in Treatment: 2 Vital Signs Height(in): 59 Pulse(bpm): 74 Weight(lbs): 250 Blood Pressure(mmHg): 139/93 Body Mass Index(BMI):  50 Temperature(F): 98.2 Respiratory 18 Rate(breaths/min): Photos: [1:No Photos] [N/A:N/A] Wound Location: [1:Right Achilles] [N/A:N/A] Wounding Event: [1:Trauma] [N/A:N/A] Primary Etiology: [1:Diabetic Wound/Ulcer of the N/A Lower Extremity] Comorbid History: [1:Anemia, Lymphedema, Sleep Apnea, Hypertension, Type II Diabetes, Osteoarthritis, Received Radiation] [N/A:N/A] Date Acquired: [1:10/16/2019] [N/A:N/A] Weeks of Treatment: [1:2] [N/A:N/A] Wound Status: [1:Open] [N/A:N/A] Measurements L x W x D 2.3x0.5x0.1 [N/A:N/A] (cm) Area (cm) : [1:0.903] [N/A:N/A] Volume (cm) : [1:0.09] [N/A:N/A] % Reduction in Area: [1:-139.50%] [N/A:N/A] % Reduction in  Volume: 20.40% [N/A:N/A] Classification: [1:Grade 1] [N/A:N/A] Exudate Amount: [1:Small] [N/A:N/A] Exudate Type: [1:Serosanguineous] [N/A:N/A] Exudate Color: [1:red, brown] [N/A:N/A] Wound Margin: [1:Epibole] [N/A:N/A] Granulation Amount: [1:Medium (34-66%)] [N/A:N/A] Granulation Quality: [1:Red] [N/A:N/A] Necrotic Amount: [1:Medium (34-66%)] [N/A:N/A] Exposed Structures: [1:Fat Layer (Subcutaneous Tissue) Exposed: Yes Fascia: No Tendon: No Muscle: No Joint: No Bone: No None] [N/A:N/A N/A] Treatment Notes Electronic Signature(s) Signed: 11/16/2019 5:54:40 PM By: Linton Ham MD Entered By: Linton Ham on 11/16/2019 10:19:59 -------------------------------------------------------------------------------- Multi-Disciplinary Care Plan Details Patient Name: Date of Service: [name] 11/16/2019 9:15 AM Medical Record [id number] Patient Account Number: [id number] Date of Birth/Sex: Treating RN: May 13, 1956 (64 y.o. Helene Shoe, Meta.Reding Primary Care Ulyess Muto: Cathlean Cower Other Clinician: Referring Shaylah Mcghie: Treating Radonna Bracher/Extender:Robson, Arta Bruce, Casper Harrison in Treatment: 2 Active Inactive Nutrition Nursing Diagnoses: Imbalanced nutrition Potential for alteratiion in Nutrition/Potential for  imbalanced nutrition Goals: Patient/caregiver agrees to and verbalizes understanding of need to use nutritional supplements and/or vitamins as prescribed Date Initiated: 11/02/2019 Target Resolution Date: 12/01/2019 Goal Status: Active Patient/caregiver will maintain therapeutic glucose control Date Initiated: 11/02/2019 Target Resolution Date: 12/01/2019 Goal Status: Active Interventions: Assess HgA1c results as ordered upon admission and as needed Assess patient nutrition upon admission and as needed per policy Provide education on elevated blood sugars and impact on wound healing Provide education on nutrition Treatment Activities: Education provided on Nutrition : 11/02/2019 Notes: Wound/Skin Impairment Nursing Diagnoses: Impaired tissue integrity Knowledge deficit related to ulceration/compromised skin integrity Goals: Patient/caregiver will verbalize understanding of skin care regimen Date Initiated: 11/02/2019 Target Resolution Date: 12/01/2019 Goal Status: Active Ulcer/skin breakdown will have a volume reduction of 30% by week 4 Date Initiated: 11/02/2019 Target Resolution Date: 12/01/2019 Goal Status: Active Interventions: Assess patient/caregiver ability to obtain necessary supplies Assess patient/caregiver ability to perform ulcer/skin care regimen upon admission and as needed Assess ulceration(s) every visit Provide education on ulcer and skin care Notes: Electronic Signature(s) Signed: 11/16/2019 6:05:55 PM By: Deon Pilling Entered By: Deon Pilling on 11/16/2019 09:46:06 -------------------------------------------------------------------------------- Pain Assessment Details Patient Name: Date of Service: [name], [name] 11/16/2019 9:15 AM Medical Record [id number] Patient Account Number: [id number] Date of Birth/Sex: Treating RN: 1956-03-27 (64 y.o. Elam Dutch Primary Care Kazi Montoro: Cathlean Cower Other Clinician: Referring Troyce Gieske: Treating  Lucita Montoya/Extender:Robson, Arta Bruce, Casper Harrison in Treatment: 2 Active Problems Location of Pain Severity and Description of Pain Patient Has Paino Yes Site Locations Pain Location: Pain in Ulcers With Dressing Change: Yes Rate the pain. Current Pain Level: 2 Worst Pain Level: 9 Least Pain Level: 2 Character of Pain Describe the Pain: Sharp, Shooting, Other: sore Pain Management and Medication Current Pain Management: Medication: Yes Other: remove wrap Is the Current Pain Management Adequate: Adequate How does your wound impact your activities of daily livingo Sleep: Yes Bathing: No Appetite: No Relationship With Others: No Bladder Continence: No Emotions: No Bowel Continence: No Work: No Toileting: No Drive: No Dressing: No Hobbies: No Electronic Signature(s) Signed: 11/17/2019 8:33:08 AM By: Baruch Gouty RN, BSN Entered By: Baruch Gouty on 11/16/2019 09:35:18 -------------------------------------------------------------------------------- Patient/Caregiver Education Details Laverle Patter 1/28/2021andnbsp9:15 Patient Name: Date of Service: V. AM Medical Record Patient Account Number: [id number] Number: Treating RN: Deon Pilling Date of Birth/Gender: May 22, 1956 (63 y.o. F) Other Clinician: Primary Care Treating Neville Route Physician: Physician/Extender: Referring Physician: Rogene Houston in Treatment: 2 Education Assessment Education Provided To: Patient Education Topics Provided Elevated Blood Sugar/ Impact on Healing: Handouts: Elevated Blood Sugars: How Do They Affect Wound Healing Methods: Explain/Verbal, Printed Responses: Reinforcements needed Electronic Signature(s)  Signed: 11/16/2019 6:05:55 PM By: Deon Pilling Entered By: Deon Pilling on 11/16/2019 09:46:19 -------------------------------------------------------------------------------- Wound Assessment Details Patient Name: Date of  Service: [name], [name] 11/16/2019 9:15 AM Medical Record [id number] Patient Account Number: [id number] Date of Birth/Sex: Treating RN: [birth year] (64 y.o. F) Primary Care Ruhee Enck: Cathlean Cower Other Clinician: Referring Jaimey Franchini: Treating Huong Luthi/Extender:Robson, Arta Bruce, Casper Harrison in Treatment: 2 Wound Status Wound Number: 1 Primary Diabetic Wound/Ulcer of the Lower Extremity Etiology: Wound Location: Right Achilles Wound Open Wounding Event: Trauma Status: Date Acquired: 10/16/2019 Comorbid Anemia, Lymphedema, Sleep Apnea, Weeks Of Treatment: 2 History: Hypertension, Type II Diabetes, Osteoarthritis, Clustered Wound: No Received Radiation Photos Wound Measurements Length: (cm) 2.3 Width: (cm) 0.5 Depth: (cm) 0.1 Area: (cm) 0.903 Volume: (cm) 0.09 Wound Description Classification: Grade 1 Wound Margin: Epibole Exudate Amount: Small Exudate Type: Serosanguineous Exudate Color: red, brown Wound Bed Granulation Amount: Medium (34-66%) Granulation Quality: Red Necrotic Amount: Medium (34-66%) Necrotic Quality: Adherent Slough After Cleansing: No rino Yes Exposed Structure xposed: No r (Subcutaneous Tissue) Exposed: Yes xposed: No xposed: No posed: No osed: No % Reduction in Area: -139.5% % Reduction in Volume: 20.4% Epithelialization: None Tunneling: No Undermining: No Foul Odor Slough/Fib Fascia E Fat Laye Tendon E Muscle E Joint Ex Bone Exp Treatment Notes Wound #1 (Right Achilles) 3. Primary Dressing Applied Hydrofera Blue 4. Secondary Dressing Foam Border Dressing 6. Support Layer Applied Kerlix/Coban Notes Horticulturist, commercial) Signed: 11/28/2019 4:28:10 PM By: Mikeal Hawthorne EMT/HBOT Previous Signature: 11/17/2019 8:33:08 AM Version By: Baruch Gouty RN, BSN Entered By: Mikeal Hawthorne on 11/28/2019 13:59:51 -------------------------------------------------------------------------------- Vitals  Details Patient Name: Date of Service: [name] 11/16/2019 9:15 AM Medical Record [id number] Patient Account Number: [id number] Date of Birth/Sex: Treating RN: 20-Jul-1956 (64 y.o. Elam Dutch Primary Care Jeptha Hinnenkamp: Cathlean Cower Other Clinician: Referring Mychal Decarlo: Treating Stpehen Petitjean/Extender:Robson, Arta Bruce, Casper Harrison in Treatment: 2 Vital Signs Time Taken: 09:32 Temperature (F): 98.2 Height (in): 59 Pulse (bpm): 82 Source: Stated Respiratory Rate (breaths/min): 18 Weight (lbs): 250 Blood Pressure (mmHg): 139/93 Source: Stated Reference Range: 80 - 120 mg / dl Body Mass Index (BMI): 50.5 Notes pt did not check blood sugar today or yesterday Electronic Signature(s) Signed: 11/17/2019 8:33:08 AM By: Baruch Gouty RN, BSN Entered By: Baruch Gouty on 11/16/2019 09:33:55

## 2019-11-20 ENCOUNTER — Other Ambulatory Visit: Payer: Self-pay

## 2019-11-20 ENCOUNTER — Other Ambulatory Visit: Payer: Self-pay | Admitting: *Deleted

## 2019-11-20 ENCOUNTER — Encounter: Payer: Self-pay | Admitting: Oncology

## 2019-11-20 ENCOUNTER — Encounter
Admission: RE | Admit: 2019-11-20 | Discharge: 2019-11-20 | Disposition: A | Discharge status: Home / Self Care | Payer: 59 | Source: Ambulatory Visit | Attending: Oncology | Admitting: Oncology

## 2019-11-20 DIAGNOSIS — C50812 Malignant neoplasm of overlapping sites of left female breast: Secondary | ICD-10-CM | POA: Insufficient documentation

## 2019-11-20 DIAGNOSIS — C50112 Malignant neoplasm of central portion of left female breast: Secondary | ICD-10-CM | POA: Diagnosis not present

## 2019-11-20 DIAGNOSIS — Z171 Estrogen receptor negative status [ER-]: Secondary | ICD-10-CM | POA: Insufficient documentation

## 2019-11-20 LAB — GLUCOSE, CAPILLARY: Glucose-Capillary: 90 mg/dL (ref 70–99)

## 2019-11-20 MED ORDER — FLUDEOXYGLUCOSE F - 18 (FDG) INJECTION
12.7800 | Freq: Once | INTRAVENOUS | Status: AC | PRN
  Administered 2019-11-20: 12.78 via INTRAVENOUS

## 2019-11-20 NOTE — Progress Notes (Signed)
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         31 ml LV SV Index:   13.92    2D Longitudinal Strain LVOT Area:     2.84 cm 2D Strain GLS Avg:     -20.0 %  RIGHT VENTRICLE RV S prime:     13.20 cm/s TAPSE (M-mode): 1.5 cm LEFT ATRIUM             Index       RIGHT ATRIUM           Index LA diam:        3.50 cm 1.74 cm/m  RA Area:     13.00 cm LA Vol (A2C):   56.2 ml 27.87 ml/m RA Volume:   29.50 ml  14.63 ml/m LA Vol (A4C):   59.0 ml 29.26 ml/m LA Biplane Vol: 59.8 ml 29.66 ml/m  AORTIC VALVE LVOT Vmax:   121.00 cm/s LVOT Vmean:  81.000 cm/s LVOT VTI:    0.271 m  AORTA Ao Root diam: 2.80 cm MITRAL VALVE                        TRICUSPID VALVE MV Area (PHT): 3.23 cm             TR Peak grad:   28.9 mmHg MV PHT:        68.15 msec           TR Vmax:        269.00 cm/s MV Decel Time: 235 msec MV E velocity: 84.50 cm/s 103 cm/s  SHUNTS MV A velocity: 89.40 cm/s 70.3 cm/s Systemic VTI:  0.27 m MV E/A ratio:  0.95       1.5       Systemic Diam: 1.90 cm  Glori Bickers MD Electronically signed by Glori Bickers MD Signature Date/Time: 11/10/2019/11:41:58 AM    Final      ASSESSMENT: 64 y.o.  [place] woman with  A:  INVASIVE DUCTAL CARCINOMA LEFT BREAST (1)  status post left lumpectomy and sentinel lymph node dissection April of 2012 for a T1b N1(mic) stage IB invasive ductal carcinoma, grade 1, estrogen receptor 82% and progesterone receptor 92% positive, with no HER-2 amplification, and an MIB-1-1 of 17%,   (2)  The patient's Oncotype DX score of 21 predicted a 13% risk of distant recurrence after 5 years of tamoxifen.  (3)  status post radiation completed August of 2012,   (4)  on tamoxifen from September of 2012 to April 2014  (5) the plan had been to initiate anastrozole in April 2014, but the patient had a menstrual cycle in May 2014, and resumed tamoxifen.  (a) discontinued tamoxifen on her own initiative June 2015 because of "aches and pains".  (b) resumed tamoxifen December 2015, discontinued February 2016 at patient's discretion  (6) morbid obesity: s/p [employer] program; considering bariattric surgery  B: ENDOMETRIAL CANCER (7) S/P laparoscopic hysterectomy with bilateral salpingo-oophorectomy and sentinel lymph node biopsy 06/16/2016 for a pT1a pN0, grade 1 endometrioid carcinoma  (8) status post left breast  biopsy 04/28/2019 for a clinically 3.5 cm ductal carcinoma in situ, grade 3, estrogen and progesterone receptor negative  (9) definitive surgery delayed (see discussion in 11/03/2019 note)  (10) genetics testing 06/14/2019 through the Multi-Gene Panel offered by [facility] found no deleterious mutations in AIP, ALK, APC, ATM, AXIN2,BAP1,  BARD1, BLM, BMPR1A, BRCA1, BRCA2, BRIP1, CASR, CDC73, CDH1, CDK4, CDKN1B, CDKN1C, CDKN2A (p14ARF), CDKN2A (p16INK4a), CEBPA, CHEK2, CTNNA1, DICER1, DIS3L2, EGFR (c.2369C>T, p.Thr790Met variant only), EPCAM (Deletion/duplication testing only), FH, FLCN, GATA2, GPC3, GREM1 (Promoter region deletion/duplication testing only), HOXB13 (c.251G>A, p.Gly84Glu), HRAS, KIT, MAX, MEN1, MET, MITF (c.952G>A, p.Glu318Lys variant only), MLH1, MSH2, MSH3, MSH6, MUTYH, NBN,  NF1, NF2, NTHL1, PALB2, PDGFRA, PHOX2B, PMS2, POLD1, POLE, POT1, PRKAR1A, PTCH1, PTEN, RAD50, RAD51C, RAD51D, RB1, RECQL4, RET, RNF43, RUNX1, SDHAF2, SDHA (sequence changes only), SDHB, SDHC, SDHD, SMAD4, SMARCA4, SMARCB1, SMARCE1, STK11, SUFU, TERC, TERT, TMEM127, TP53, TSC1, TSC2, VHL, WRN and WT1.    C: METASTATIC BREAST CANCER: JAN 2021 (11) left axillary lymph node biopsy 10/30/2019 documents invasive mammary carcinoma, grade 3, estrogen receptor positive (80%, weak), progesterone receptor negative, HER-2 amplified (3+) MIB-170%  (a) breast MRI 11/23/2019  (b) Chest CT W/C and bone scan 11/13/2019 show prevascular adenopathy (stage IV), no lung, liver or bone metastases; left breast mass and regional nodes  (c) PET 11/20/2019 shows prevascular node SUV of 22, bilateral paratracheal nodes with SUV 6-7  (12) neoadjuvant chemotherapy will consist of trastuzumab (Ogivri), Pertuzumab, carboplatin, docetaxel every 21 days x 6, starting 11/21/2019  (13) anti-HER-2 treatment to be continued indefinitely  (a) echo 11/09/2019 shows an ejection fraction in the 60-65% range   (14) surgery to follow as appropriate  (15) adjuvant radiation as appropriate   PLAN: Canisha has stage IV 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  This is quite hot on PET.  If it were easily biopsy able I would go ahead and do that, but this will require surgical intervention and I think it is best to proceed to treatment on the expectation that the mediastinal involvement is metastatic breast cancer and proceed accordingly.  She understands that stage IV breast cancer is not curable with our current knowledge base. The goal of treatment is control. The strategy of treatment is to do only the minimum necessary to control the growth of the tumor so that the patient can have as normal a life as possible. There is no survival advantage in treating aggressively if treating less aggressively results in tumor control. With  this strategy stage IV breast cancer in many cases can function as a "chronic illness": something that cannot be quite gotten rid of but can be controlled for an indefinite period of time  It is true that in her 2+ cases sometimes we get remarkable responses.  Ideally what we would end up with at the end of chemo would be a complete radiologic response.  If so very likely we would proceed to surgery and adjuvant radiation.  In any case the plan would be to continue anti-HER-2 treatment indefinitely.  We again reviewed the possible toxicities side effects and complications of her treatment.  She will have her first dose today.  She is going to see me again in a week to troubleshoot side effects and I have asked her to keep a diary so we can most easily get that accomplished.  She is concerned regarding disability issues and she will be discussing that with HR at her job.  She knows to call for any other issue that  may develop before the next visit.  Total time this encounter 40 minutes.Chauncey Cruel, MD   11/21/2019 Oncology and Hematology [facility] [place] Tel. [phone]  Joylene Igo [phone]   I, Wilburn Mylar, am acting as scribe for Dr. [name]. Chamika Cunanan.  I, Lurline Del MD, have reviewed the above documentation for accuracy and completeness, and I agree with the above.   *Total Encounter Time as defined by the Centers for Medicare and Medicaid Services includes, in addition to the face-to-face time of a patient visit (documented in the note above) non-face-to-face time: obtaining and reviewing outside history, ordering and reviewing medications, tests or procedures, care coordination (communications with other health care professionals or caregivers) and documentation in the medical record.

## 2019-11-20 NOTE — Progress Notes (Signed)
Pt was approved for Fulphila from1/27/21 to1/26/22 for up to $10,000.The program reduces pt's copay responsibility to $0.  She was also approved for Ogivrifrom1/27/21 to1/26/22 for up to $25,000.The program reduces pt's copay responsibility to $0.

## 2019-11-21 ENCOUNTER — Inpatient Hospital Stay: Payer: 59 | Attending: Oncology

## 2019-11-21 ENCOUNTER — Other Ambulatory Visit: Payer: Self-pay

## 2019-11-21 ENCOUNTER — Inpatient Hospital Stay: Payer: 59 | Admitting: Oncology

## 2019-11-21 ENCOUNTER — Encounter: Payer: Self-pay | Admitting: *Deleted

## 2019-11-21 ENCOUNTER — Other Ambulatory Visit: Payer: Self-pay | Admitting: Medical

## 2019-11-21 ENCOUNTER — Inpatient Hospital Stay: Payer: 59

## 2019-11-21 ENCOUNTER — Ambulatory Visit (HOSPITAL_BASED_OUTPATIENT_CLINIC_OR_DEPARTMENT_OTHER): Payer: 59 | Admitting: Medical

## 2019-11-21 VITALS — BP 147/99 | HR 66 | Temp 97.8°F | Resp 18 | Ht 60.0 in | Wt 243.4 lb

## 2019-11-21 VITALS — BP 159/93 | HR 50 | Temp 98.4°F | Resp 17

## 2019-11-21 DIAGNOSIS — C50112 Malignant neoplasm of central portion of left female breast: Secondary | ICD-10-CM

## 2019-11-21 DIAGNOSIS — E860 Dehydration: Secondary | ICD-10-CM | POA: Insufficient documentation

## 2019-11-21 DIAGNOSIS — K429 Umbilical hernia without obstruction or gangrene: Secondary | ICD-10-CM

## 2019-11-21 DIAGNOSIS — I10 Essential (primary) hypertension: Secondary | ICD-10-CM | POA: Diagnosis not present

## 2019-11-21 DIAGNOSIS — C50812 Malignant neoplasm of overlapping sites of left female breast: Secondary | ICD-10-CM | POA: Diagnosis not present

## 2019-11-21 DIAGNOSIS — Z5112 Encounter for antineoplastic immunotherapy: Secondary | ICD-10-CM | POA: Insufficient documentation

## 2019-11-21 DIAGNOSIS — E559 Vitamin D deficiency, unspecified: Secondary | ICD-10-CM

## 2019-11-21 DIAGNOSIS — R310 Gross hematuria: Secondary | ICD-10-CM | POA: Diagnosis not present

## 2019-11-21 DIAGNOSIS — Z803 Family history of malignant neoplasm of breast: Secondary | ICD-10-CM | POA: Insufficient documentation

## 2019-11-21 DIAGNOSIS — Z171 Estrogen receptor negative status [ER-]: Secondary | ICD-10-CM

## 2019-11-21 DIAGNOSIS — N3289 Other specified disorders of bladder: Secondary | ICD-10-CM | POA: Diagnosis not present

## 2019-11-21 DIAGNOSIS — Z5111 Encounter for antineoplastic chemotherapy: Secondary | ICD-10-CM | POA: Insufficient documentation

## 2019-11-21 DIAGNOSIS — R197 Diarrhea, unspecified: Secondary | ICD-10-CM | POA: Diagnosis not present

## 2019-11-21 DIAGNOSIS — C773 Secondary and unspecified malignant neoplasm of axilla and upper limb lymph nodes: Secondary | ICD-10-CM | POA: Insufficient documentation

## 2019-11-21 DIAGNOSIS — Z5189 Encounter for other specified aftercare: Secondary | ICD-10-CM | POA: Insufficient documentation

## 2019-11-21 DIAGNOSIS — Z923 Personal history of irradiation: Secondary | ICD-10-CM | POA: Insufficient documentation

## 2019-11-21 DIAGNOSIS — Z853 Personal history of malignant neoplasm of breast: Secondary | ICD-10-CM | POA: Diagnosis not present

## 2019-11-21 DIAGNOSIS — Z90722 Acquired absence of ovaries, bilateral: Secondary | ICD-10-CM | POA: Insufficient documentation

## 2019-11-21 DIAGNOSIS — M797 Fibromyalgia: Secondary | ICD-10-CM

## 2019-11-21 DIAGNOSIS — M79606 Pain in leg, unspecified: Secondary | ICD-10-CM

## 2019-11-21 DIAGNOSIS — I872 Venous insufficiency (chronic) (peripheral): Secondary | ICD-10-CM

## 2019-11-21 DIAGNOSIS — Z9079 Acquired absence of other genital organ(s): Secondary | ICD-10-CM | POA: Insufficient documentation

## 2019-11-21 DIAGNOSIS — K760 Fatty (change of) liver, not elsewhere classified: Secondary | ICD-10-CM | POA: Insufficient documentation

## 2019-11-21 DIAGNOSIS — Z79899 Other long term (current) drug therapy: Secondary | ICD-10-CM | POA: Diagnosis not present

## 2019-11-21 DIAGNOSIS — D0512 Intraductal carcinoma in situ of left breast: Secondary | ICD-10-CM

## 2019-11-21 DIAGNOSIS — Z7952 Long term (current) use of systemic steroids: Secondary | ICD-10-CM | POA: Diagnosis not present

## 2019-11-21 DIAGNOSIS — Z8249 Family history of ischemic heart disease and other diseases of the circulatory system: Secondary | ICD-10-CM | POA: Insufficient documentation

## 2019-11-21 DIAGNOSIS — E119 Type 2 diabetes mellitus without complications: Secondary | ICD-10-CM | POA: Insufficient documentation

## 2019-11-21 DIAGNOSIS — Z170 Estrogen receptor positive status [ER+]: Secondary | ICD-10-CM | POA: Insufficient documentation

## 2019-11-21 DIAGNOSIS — C50912 Malignant neoplasm of unspecified site of left female breast: Secondary | ICD-10-CM | POA: Diagnosis present

## 2019-11-21 DIAGNOSIS — Z801 Family history of malignant neoplasm of trachea, bronchus and lung: Secondary | ICD-10-CM | POA: Diagnosis not present

## 2019-11-21 DIAGNOSIS — I700 Atherosclerosis of aorta: Secondary | ICD-10-CM | POA: Insufficient documentation

## 2019-11-21 DIAGNOSIS — Z90710 Acquired absence of both cervix and uterus: Secondary | ICD-10-CM | POA: Insufficient documentation

## 2019-11-21 DIAGNOSIS — C541 Malignant neoplasm of endometrium: Secondary | ICD-10-CM

## 2019-11-21 DIAGNOSIS — Z6841 Body Mass Index (BMI) 40.0 and over, adult: Secondary | ICD-10-CM

## 2019-11-21 DIAGNOSIS — Z8542 Personal history of malignant neoplasm of other parts of uterus: Secondary | ICD-10-CM | POA: Insufficient documentation

## 2019-11-21 DIAGNOSIS — L97311 Non-pressure chronic ulcer of right ankle limited to breakdown of skin: Secondary | ICD-10-CM

## 2019-11-21 LAB — CBC WITH DIFFERENTIAL (CANCER CENTER ONLY)
Abs Immature Granulocytes: 0.03 10*3/uL (ref 0.00–0.07)
Basophils Absolute: 0 10*3/uL (ref 0.0–0.1)
Basophils Relative: 0 %
Eosinophils Absolute: 0 10*3/uL (ref 0.0–0.5)
Eosinophils Relative: 0 %
HCT: 42.6 % (ref 36.0–46.0)
Hemoglobin: 13.2 g/dL (ref 12.0–15.0)
Immature Granulocytes: 0 %
Lymphocytes Relative: 8 %
Lymphs Abs: 0.7 10*3/uL (ref 0.7–4.0)
MCH: 27.3 pg (ref 26.0–34.0)
MCHC: 31 g/dL (ref 30.0–36.0)
MCV: 88.2 fL (ref 80.0–100.0)
Monocytes Absolute: 0.2 10*3/uL (ref 0.1–1.0)
Monocytes Relative: 3 %
Neutro Abs: 7.6 10*3/uL (ref 1.7–7.7)
Neutrophils Relative %: 89 %
Platelet Count: 295 10*3/uL (ref 150–400)
RBC: 4.83 MIL/uL (ref 3.87–5.11)
RDW: 14.7 % (ref 11.5–15.5)
WBC Count: 8.5 10*3/uL (ref 4.0–10.5)
nRBC: 0 % (ref 0.0–0.2)

## 2019-11-21 LAB — CMP (CANCER CENTER ONLY)
ALT: 19 U/L (ref 0–44)
AST: 13 U/L — ABNORMAL LOW (ref 15–41)
Albumin: 3.6 g/dL (ref 3.5–5.0)
Alkaline Phosphatase: 55 U/L (ref 38–126)
Anion gap: 10 (ref 5–15)
BUN: 21 mg/dL (ref 8–23)
CO2: 25 mmol/L (ref 22–32)
Calcium: 10 mg/dL (ref 8.9–10.3)
Chloride: 104 mmol/L (ref 98–111)
Creatinine: 0.86 mg/dL (ref 0.44–1.00)
GFR, Est AFR Am: >60 mL/min (ref >60)
GFR, Estimated: >60 mL/min (ref >60)
Glucose, Bld: 192 mg/dL — ABNORMAL HIGH (ref 70–99)
Potassium: 4.2 mmol/L (ref 3.5–5.1)
Sodium: 139 mmol/L (ref 135–145)
Total Bilirubin: 0.4 mg/dL (ref 0.3–1.2)
Total Protein: 9.2 g/dL — ABNORMAL HIGH (ref 6.5–8.1)

## 2019-11-21 MED ORDER — SODIUM CHLORIDE 0.9 % IV SOLN
420.0000 mg | Freq: Once | INTRAVENOUS | Status: AC
  Administered 2019-11-21: 420 mg via INTRAVENOUS
  Filled 2019-11-21: qty 14

## 2019-11-21 MED ORDER — HEPARIN SOD (PORK) LOCK FLUSH 100 UNIT/ML IV SOLN
500.0000 [IU] | Freq: Once | INTRAVENOUS | Status: AC | PRN
  Administered 2019-11-21: 500 [IU]
  Filled 2019-11-21: qty 5

## 2019-11-21 MED ORDER — CLONIDINE HCL 0.1 MG PO TABS
ORAL_TABLET | ORAL | Status: AC
  Filled 2019-11-21: qty 1

## 2019-11-21 MED ORDER — ACETAMINOPHEN 500 MG PO TABS
1000.0000 mg | ORAL_TABLET | Freq: Once | ORAL | Status: AC
  Administered 2019-11-21: 1000 mg via ORAL

## 2019-11-21 MED ORDER — SODIUM CHLORIDE 0.9% FLUSH
10.0000 mL | INTRAVENOUS | Status: DC | PRN
  Administered 2019-11-21: 10 mL
  Filled 2019-11-21: qty 10

## 2019-11-21 MED ORDER — SODIUM CHLORIDE 0.9 % IV SOLN
150.0000 mg | Freq: Once | INTRAVENOUS | Status: AC
  Administered 2019-11-21: 150 mg via INTRAVENOUS
  Filled 2019-11-21: qty 150

## 2019-11-21 MED ORDER — DIPHENHYDRAMINE HCL 25 MG PO CAPS
ORAL_CAPSULE | ORAL | Status: AC
  Filled 2019-11-21: qty 2

## 2019-11-21 MED ORDER — SODIUM CHLORIDE 0.9 % IV SOLN
732.0000 mg | Freq: Once | INTRAVENOUS | Status: AC
  Administered 2019-11-21: 730 mg via INTRAVENOUS
  Filled 2019-11-21: qty 73

## 2019-11-21 MED ORDER — ACETAMINOPHEN 500 MG PO TABS
ORAL_TABLET | ORAL | Status: AC
  Filled 2019-11-21: qty 2

## 2019-11-21 MED ORDER — SODIUM CHLORIDE 0.9 % IV SOLN
Freq: Once | INTRAVENOUS | Status: AC
  Filled 2019-11-21: qty 250

## 2019-11-21 MED ORDER — ACETAMINOPHEN 325 MG PO TABS
ORAL_TABLET | ORAL | Status: AC
  Filled 2019-11-21: qty 2

## 2019-11-21 MED ORDER — SODIUM CHLORIDE 0.9 % IV SOLN: 10.0000 mg | Freq: Once | INTRAVENOUS | Status: DC

## 2019-11-21 MED ORDER — PALONOSETRON HCL INJECTION 0.25 MG/5ML
0.2500 mg | Freq: Once | INTRAVENOUS | Status: AC
  Administered 2019-11-21: 0.25 mg via INTRAVENOUS

## 2019-11-21 MED ORDER — DEXAMETHASONE SODIUM PHOSPHATE 10 MG/ML IJ SOLN
10.0000 mg | Freq: Once | INTRAMUSCULAR | Status: AC
  Administered 2019-11-21: 10 mg via INTRAVENOUS

## 2019-11-21 MED ORDER — CLONIDINE HCL 0.1 MG PO TABS
0.1000 mg | ORAL_TABLET | Freq: Every day | ORAL | Status: AC
  Administered 2019-11-21: 0.1 mg via ORAL

## 2019-11-21 MED ORDER — DEXAMETHASONE SODIUM PHOSPHATE 10 MG/ML IJ SOLN
INTRAMUSCULAR | Status: AC
  Filled 2019-11-21: qty 1

## 2019-11-21 MED ORDER — DIPHENHYDRAMINE HCL 25 MG PO CAPS
25.0000 mg | ORAL_CAPSULE | Freq: Once | ORAL | Status: AC
  Administered 2019-11-21: 25 mg via ORAL

## 2019-11-21 MED ORDER — ACETAMINOPHEN 325 MG PO TABS
650.0000 mg | ORAL_TABLET | Freq: Once | ORAL | Status: AC
  Administered 2019-11-21: 650 mg via ORAL

## 2019-11-21 MED ORDER — TRASTUZUMAB-DKST CHEMO 150 MG IV SOLR
900.0000 mg | Freq: Once | INTRAVENOUS | Status: AC
  Administered 2019-11-21: 900 mg via INTRAVENOUS
  Filled 2019-11-21: qty 42.86

## 2019-11-21 MED ORDER — PALONOSETRON HCL INJECTION 0.25 MG/5ML
INTRAVENOUS | Status: AC
  Filled 2019-11-21: qty 5

## 2019-11-21 MED ORDER — SODIUM CHLORIDE 0.9 % IV SOLN
75.0000 mg/m2 | Freq: Once | INTRAVENOUS | Status: AC
  Administered 2019-11-21: 160 mg via INTRAVENOUS
  Filled 2019-11-21: qty 16

## 2019-11-21 NOTE — Patient Instructions (Signed)
[facility] Discharge Instructions for Patients Receiving Chemotherapy  Today you received the following chemotherapy agents: trastuzumab, pertuzumab, docetaxel, carboplatin.   To help prevent nausea and vomiting after your treatment, we encourage you to take your nausea medication as prescribed by your physician   If you develop nausea and vomiting that is not controlled by your nausea medication, call the clinic.   BELOW ARE SYMPTOMS THAT SHOULD BE REPORTED IMMEDIATELY:  *FEVER GREATER THAN 100.5 F  *CHILLS WITH OR WITHOUT FEVER  NAUSEA AND VOMITING THAT IS NOT CONTROLLED WITH YOUR NAUSEA MEDICATION  *UNUSUAL SHORTNESS OF BREATH  *UNUSUAL BRUISING OR BLEEDING  TENDERNESS IN MOUTH AND THROAT WITH OR WITHOUT PRESENCE OF ULCERS  *URINARY PROBLEMS  *BOWEL PROBLEMS  UNUSUAL RASH Items with * indicate a potential emergency and should be followed up as soon as possible.  Feel free to call the clinic should you have any questions or concerns. The clinic phone number is [phone].  Please show the [facility] at check-in to the Emergency Department and triage nurse.  Trastuzumab injection for infusion What is this medicine? TRASTUZUMAB (tras TOO zoo mab) is a monoclonal antibody. It is used to treat breast cancer and stomach cancer. This medicine may be used for other purposes; ask your health care provider or pharmacist if you have questions. COMMON BRAND NAME(S): Herceptin, Galvin Proffer, Trazimera What should I tell my health care provider before I take this medicine? They need to know if you have any of these conditions:  heart disease  heart failure  lung or breathing disease, like asthma  an unusual or allergic reaction to trastuzumab, benzyl alcohol, or other medications, foods, dyes, or preservatives  pregnant or trying to get pregnant  breast-feeding How should I use this medicine? This drug is given as an infusion  into a vein. It is administered in a hospital or clinic by a specially trained health care professional. Talk to your pediatrician regarding the use of this medicine in children. This medicine is not approved for use in children. Overdosage: If you think you have taken too much of this medicine contact a poison control center or emergency room at once. NOTE: This medicine is only for you. Do not share this medicine with others. What if I miss a dose? It is important not to miss a dose. Call your doctor or health care professional if you are unable to keep an appointment. What may interact with this medicine? This medicine may interact with the following medications:  certain types of chemotherapy, such as daunorubicin, doxorubicin, epirubicin, and idarubicin This list may not describe all possible interactions. Give your health care provider a list of all the medicines, herbs, non-prescription drugs, or dietary supplements you use. Also tell them if you smoke, drink alcohol, or use illegal drugs. Some items may interact with your medicine. What should I watch for while using this medicine? Visit your doctor for checks on your progress. Report any side effects. Continue your course of treatment even though you feel ill unless your doctor tells you to stop. Call your doctor or health care professional for advice if you get a fever, chills or sore throat, or other symptoms of a cold or flu. Do not treat yourself. Try to avoid being around people who are sick. You may experience fever, chills and shaking during your first infusion. These effects are usually mild and can be treated with other medicines. Report any side effects during the infusion to  your health care professional. Fever and chills usually do not happen with later infusions. Do not become pregnant while taking this medicine or for 7 months after stopping it. Women should inform their doctor if they wish to become pregnant or think they might  be pregnant. Women of child-bearing potential will need to have a negative pregnancy test before starting this medicine. There is a potential for serious side effects to an unborn child. Talk to your health care professional or pharmacist for more information. Do not breast-feed an infant while taking this medicine or for 7 months after stopping it. Women must use effective birth control with this medicine. What side effects may I notice from receiving this medicine? Side effects that you should report to your doctor or health care professional as soon as possible:  allergic reactions like skin rash, itching or hives, swelling of the face, lips, or tongue  chest pain or palpitations  cough  dizziness  feeling faint or lightheaded, falls  fever  general ill feeling or flu-like symptoms  signs of worsening heart failure like breathing problems; swelling in your legs and feet  unusually weak or tired Side effects that usually do not require medical attention (report to your doctor or health care professional if they continue or are bothersome):  bone pain  changes in taste  diarrhea  joint pain  nausea/vomiting  weight loss This list may not describe all possible side effects. Call your doctor for medical advice about side effects. You may report side effects to FDA at 1-800-FDA-1088. Where should I keep my medicine? This drug is given in a hospital or clinic and will not be stored at home. NOTE: This sheet is a summary. It may not cover all possible information. If you have questions about this medicine, talk to your doctor, pharmacist, or health care provider.  2020 Elsevier/Gold Standard (2016-09-29 14:37:52)  Pertuzumab injection What is this medicine? PERTUZUMAB (per TOOZ ue mab) is a monoclonal antibody. It is used to treat breast cancer. This medicine may be used for other purposes; ask your health care provider or pharmacist if you have questions. COMMON BRAND  NAME(S): PERJETA What should I tell my health care provider before I take this medicine? They need to know if you have any of these conditions:  heart disease  heart failure  high blood pressure  history of irregular heart beat  recent or ongoing radiation therapy  an unusual or allergic reaction to pertuzumab, other medicines, foods, dyes, or preservatives  pregnant or trying to get pregnant  breast-feeding How should I use this medicine? This medicine is for infusion into a vein. It is given by a health care professional in a hospital or clinic setting. Talk to your pediatrician regarding the use of this medicine in children. Special care may be needed. Overdosage: If you think you have taken too much of this medicine contact a poison control center or emergency room at once. NOTE: This medicine is only for you. Do not share this medicine with others. What if I miss a dose? It is important not to miss your dose. Call your doctor or health care professional if you are unable to keep an appointment. What may interact with this medicine? Interactions are not expected. Give your health care provider a list of all the medicines, herbs, non-prescription drugs, or dietary supplements you use. Also tell them if you smoke, drink alcohol, or use illegal drugs. Some items may interact with your medicine. This list may not  describe all possible interactions. Give your health care provider a list of all the medicines, herbs, non-prescription drugs, or dietary supplements you use. Also tell them if you smoke, drink alcohol, or use illegal drugs. Some items may interact with your medicine. What should I watch for while using this medicine? Your condition will be monitored carefully while you are receiving this medicine. Report any side effects. Continue your course of treatment even though you feel ill unless your doctor tells you to stop. Do not become pregnant while taking this medicine or for 7  months after stopping it. Women should inform their doctor if they wish to become pregnant or think they might be pregnant. Women of child-bearing potential will need to have a negative pregnancy test before starting this medicine. There is a potential for serious side effects to an unborn child. Talk to your health care professional or pharmacist for more information. Do not breast-feed an infant while taking this medicine or for 7 months after stopping it. Women must use effective birth control with this medicine. Call your doctor or health care professional for advice if you get a fever, chills or sore throat, or other symptoms of a cold or flu. Do not treat yourself. Try to avoid being around people who are sick. You may experience fever, chills, and headache during the infusion. Report any side effects during the infusion to your health care professional. What side effects may I notice from receiving this medicine? Side effects that you should report to your doctor or health care professional as soon as possible:  breathing problems  chest pain or palpitations  dizziness  feeling faint or lightheaded  fever or chills  skin rash, itching or hives  sore throat  swelling of the face, lips, or tongue  swelling of the legs or ankles  unusually weak or tired Side effects that usually do not require medical attention (report to your doctor or health care professional if they continue or are bothersome):  diarrhea  hair loss  nausea, vomiting  tiredness This list may not describe all possible side effects. Call your doctor for medical advice about side effects. You may report side effects to FDA at 1-800-FDA-1088. Where should I keep my medicine? This drug is given in a hospital or clinic and will not be stored at home. NOTE: This sheet is a summary. It may not cover all possible information. If you have questions about this medicine, talk to your doctor, pharmacist, or health care  provider.  2020 Elsevier/Gold Standard (2015-11-07 12:08:50)  Docetaxel injection What is this medicine? DOCETAXEL (doe se TAX el) is a chemotherapy drug. It targets fast dividing cells, like cancer cells, and causes these cells to die. This medicine is used to treat many types of cancers like breast cancer, certain stomach cancers, head and neck cancer, lung cancer, and prostate cancer. This medicine may be used for other purposes; ask your health care provider or pharmacist if you have questions. COMMON BRAND NAME(S): Docefrez, Taxotere What should I tell my health care provider before I take this medicine? They need to know if you have any of these conditions:  infection (especially a virus infection such as chickenpox, cold sores, or herpes)  liver disease  low blood counts, like low white cell, platelet, or red cell counts  an unusual or allergic reaction to docetaxel, polysorbate 80, other chemotherapy agents, other medicines, foods, dyes, or preservatives  pregnant or trying to get pregnant  breast-feeding How should I use this  medicine? This drug is given as an infusion into a vein. It is administered in a hospital or clinic by a specially trained health care professional. Talk to your pediatrician regarding the use of this medicine in children. Special care may be needed. Overdosage: If you think you have taken too much of this medicine contact a poison control center or emergency room at once. NOTE: This medicine is only for you. Do not share this medicine with others. What if I miss a dose? It is important not to miss your dose. Call your doctor or health care professional if you are unable to keep an appointment. What may interact with this medicine?  aprepitant  certain antibiotics like erythromycin or clarithromycin  certain antivirals for HIV or hepatitis  certain medicines for fungal infections like fluconazole, itraconazole, ketoconazole, posaconazole, or  voriconazole  cimetidine  ciprofloxacin  conivaptan  cyclosporine  dronedarone  fluvoxamine  grapefruit juice  imatinib  verapamil This list may not describe all possible interactions. Give your health care provider a list of all the medicines, herbs, non-prescription drugs, or dietary supplements you use. Also tell them if you smoke, drink alcohol, or use illegal drugs. Some items may interact with your medicine. What should I watch for while using this medicine? Your condition will be monitored carefully while you are receiving this medicine. You will need important blood work done while you are taking this medicine. Call your doctor or health care professional for advice if you get a fever, chills or sore throat, or other symptoms of a cold or flu. Do not treat yourself. This drug decreases your body's ability to fight infections. Try to avoid being around people who are sick. Some products may contain alcohol. Ask your health care professional if this medicine contains alcohol. Be sure to tell all health care professionals you are taking this medicine. Certain medicines, like metronidazole and disulfiram, can cause an unpleasant reaction when taken with alcohol. The reaction includes flushing, headache, nausea, vomiting, sweating, and increased thirst. The reaction can last from 30 minutes to several hours. You may get drowsy or dizzy. Do not drive, use machinery, or do anything that needs mental alertness until you know how this medicine affects you. Do not stand or sit up quickly, especially if you are an older patient. This reduces the risk of dizzy or fainting spells. Alcohol may interfere with the effect of this medicine. Talk to your health care professional about your risk of cancer. You may be more at risk for certain types of cancer if you take this medicine. Do not become pregnant while taking this medicine or for 6 months after stopping it. Women should inform their doctor if  they wish to become pregnant or think they might be pregnant. There is a potential for serious side effects to an unborn child. Talk to your health care professional or pharmacist for more information. Do not breast-feed an infant while taking this medicine or for 1 week after stopping it. Males who get this medicine must use a condom during sex with females who can get pregnant. If you get a woman pregnant, the baby could have birth defects. The baby could die before they are born. You will need to continue wearing a condom for 3 months after stopping the medicine. Tell your health care provider right away if your partner becomes pregnant while you are taking this medicine. This may interfere with the ability to father a child. You should talk to your doctor or health  care professional if you are concerned about your fertility. What side effects may I notice from receiving this medicine? Side effects that you should report to your doctor or health care professional as soon as possible:  allergic reactions like skin rash, itching or hives, swelling of the face, lips, or tongue  blurred vision  breathing problems  changes in vision  low blood counts - This drug may decrease the number of white blood cells, red blood cells and platelets. You may be at increased risk for infections and bleeding.  nausea and vomiting  pain, redness or irritation at site where injected  pain, tingling, numbness in the hands or feet  redness, blistering, peeling, or loosening of the skin, including inside the mouth  signs of decreased platelets or bleeding - bruising, pinpoint red spots on the skin, black, tarry stools, nosebleeds  signs of decreased red blood cells - unusually weak or tired, fainting spells, lightheadedness  signs of infection - fever or chills, cough, sore throat, pain or difficulty passing urine  swelling of the ankle, feet, hands Side effects that usually do not require medical attention  (report to your doctor or health care professional if they continue or are bothersome):  constipation  diarrhea  fingernail or toenail changes  hair loss  loss of appetite  mouth sores  muscle pain This list may not describe all possible side effects. Call your doctor for medical advice about side effects. You may report side effects to FDA at 1-800-FDA-1088. Where should I keep my medicine? This drug is given in a hospital or clinic and will not be stored at home. NOTE: This sheet is a summary. It may not cover all possible information. If you have questions about this medicine, talk to your doctor, pharmacist, or health care provider.  2020 Elsevier/Gold Standard (2019-06-01 10:19:06)  Carboplatin injection What is this medicine? CARBOPLATIN (KAR boe pla tin) is a chemotherapy drug. It targets fast dividing cells, like cancer cells, and causes these cells to die. This medicine is used to treat ovarian cancer and many other cancers. This medicine may be used for other purposes; ask your health care provider or pharmacist if you have questions. COMMON BRAND NAME(S): Paraplatin What should I tell my health care provider before I take this medicine? They need to know if you have any of these conditions:  blood disorders  hearing problems  kidney disease  recent or ongoing radiation therapy  an unusual or allergic reaction to carboplatin, cisplatin, other chemotherapy, other medicines, foods, dyes, or preservatives  pregnant or trying to get pregnant  breast-feeding How should I use this medicine? This drug is usually given as an infusion into a vein. It is administered in a hospital or clinic by a specially trained health care professional. Talk to your pediatrician regarding the use of this medicine in children. Special care may be needed. Overdosage: If you think you have taken too much of this medicine contact a poison control center or emergency room at once. NOTE:  This medicine is only for you. Do not share this medicine with others. What if I miss a dose? It is important not to miss a dose. Call your doctor or health care professional if you are unable to keep an appointment. What may interact with this medicine?  medicines for seizures  medicines to increase blood counts like filgrastim, pegfilgrastim, sargramostim  some antibiotics like amikacin, gentamicin, neomycin, streptomycin, tobramycin  vaccines Talk to your doctor or health care professional before taking  any of these medicines:  acetaminophen  aspirin  ibuprofen  ketoprofen  naproxen This list may not describe all possible interactions. Give your health care provider a list of all the medicines, herbs, non-prescription drugs, or dietary supplements you use. Also tell them if you smoke, drink alcohol, or use illegal drugs. Some items may interact with your medicine. What should I watch for while using this medicine? Your condition will be monitored carefully while you are receiving this medicine. You will need important blood work done while you are taking this medicine. This drug may make you feel generally unwell. This is not uncommon, as chemotherapy can affect healthy cells as well as cancer cells. Report any side effects. Continue your course of treatment even though you feel ill unless your doctor tells you to stop. In some cases, you may be given additional medicines to help with side effects. Follow all directions for their use. Call your doctor or health care professional for advice if you get a fever, chills or sore throat, or other symptoms of a cold or flu. Do not treat yourself. This drug decreases your body's ability to fight infections. Try to avoid being around people who are sick. This medicine may increase your risk to bruise or bleed. Call your doctor or health care professional if you notice any unusual bleeding. Be careful brushing and flossing your teeth or using  a toothpick because you may get an infection or bleed more easily. If you have any dental work done, tell your dentist you are receiving this medicine. Avoid taking products that contain aspirin, acetaminophen, ibuprofen, naproxen, or ketoprofen unless instructed by your doctor. These medicines may hide a fever. Do not become pregnant while taking this medicine. Women should inform their doctor if they wish to become pregnant or think they might be pregnant. There is a potential for serious side effects to an unborn child. Talk to your health care professional or pharmacist for more information. Do not breast-feed an infant while taking this medicine. What side effects may I notice from receiving this medicine? Side effects that you should report to your doctor or health care professional as soon as possible:  allergic reactions like skin rash, itching or hives, swelling of the face, lips, or tongue  signs of infection - fever or chills, cough, sore throat, pain or difficulty passing urine  signs of decreased platelets or bleeding - bruising, pinpoint red spots on the skin, black, tarry stools, nosebleeds  signs of decreased red blood cells - unusually weak or tired, fainting spells, lightheadedness  breathing problems  changes in hearing  changes in vision  chest pain  high blood pressure  low blood counts - This drug may decrease the number of white blood cells, red blood cells and platelets. You may be at increased risk for infections and bleeding.  nausea and vomiting  pain, swelling, redness or irritation at the injection site  pain, tingling, numbness in the hands or feet  problems with balance, talking, walking  trouble passing urine or change in the amount of urine Side effects that usually do not require medical attention (report to your doctor or health care professional if they continue or are bothersome):  hair loss  loss of appetite  metallic taste in the mouth  or changes in taste This list may not describe all possible side effects. Call your doctor for medical advice about side effects. You may report side effects to FDA at 1-800-FDA-1088. Where should I keep my medicine?  This drug is given in a hospital or clinic and will not be stored at home. NOTE: This sheet is a summary. It may not cover all possible information. If you have questions about this medicine, talk to your doctor, pharmacist, or health care provider.  2020 Elsevier/Gold Standard (2008-01-10 14:38:05)

## 2019-11-21 NOTE — Progress Notes (Unsigned)
1716--Patient complaining of "tension headache" above left eye.  VSS. Sandi Mealy, PA notified. Verbal order to give 2 extra strength tylenol PO.  Per Sandi Mealy, PA, ok to proceed with infusion, but he will to come see patient at bedside.

## 2019-11-22 ENCOUNTER — Other Ambulatory Visit: Payer: Self-pay | Admitting: *Deleted

## 2019-11-22 ENCOUNTER — Telehealth: Payer: Self-pay | Admitting: *Deleted

## 2019-11-22 ENCOUNTER — Encounter (HOSPITAL_COMMUNITY): Payer: 59

## 2019-11-22 NOTE — Telephone Encounter (Signed)
This RN spoke with pt her concern with noted elevation in BP recently ( noted per our checks since 11/03/2019). Ranging in the 150/90's.  She called her primary MD's office and was requested by staff to contact this office.  Kasinda wanted to ask if current therapy could be causing elevation and if she needs to have her BP medication adjusted.  Noted elevations since she has had to undergo further workup for breast cancer and treatment planning.  This RN discussed that BP has only been checked in the office which could itself cause an increase as well how her body is managing the stress of not only her diagnosis but port placement and chemotherapy.  This RN asked if pt could check her BP at home in a calm environment at least 2 x a day.  This RN reviewed current home medications which may affect her BP including current use of decadron.  Suzan states she has had one episode of " feeling tension in my head ".  She understands and has concerns for continued elevation and s/s of stroke.  This RN discussed above as well as if she feels the tension and her bp is elevated she can take 1/2 ativan to help. This RN asked pt to monitor BP as above and to document as well as how she is feeling and if she needs to take additional ativan for symptoms. She understands to call if further concerns.  If blood pressure continues to remain elevated pt may need to see her primary MD for further evaluation and medication changes.  This RN verified current medication as Micardis 80/12.5mg  prescribed by Dr Cathlean Cower.  This note will be forwarded to Dr Jenny Reichmann for review of communication.

## 2019-11-23 ENCOUNTER — Telehealth: Payer: Self-pay | Admitting: *Deleted

## 2019-11-23 ENCOUNTER — Other Ambulatory Visit: Payer: Self-pay

## 2019-11-23 ENCOUNTER — Ambulatory Visit
Admission: RE | Admit: 2019-11-23 | Discharge: 2019-11-23 | Disposition: A | Discharge status: Home / Self Care | Payer: 59 | Source: Ambulatory Visit | Attending: Oncology | Admitting: Oncology

## 2019-11-23 ENCOUNTER — Inpatient Hospital Stay: Payer: 59

## 2019-11-23 VITALS — BP 144/74 | HR 60 | Temp 98.0°F | Resp 18

## 2019-11-23 DIAGNOSIS — C50112 Malignant neoplasm of central portion of left female breast: Secondary | ICD-10-CM

## 2019-11-23 DIAGNOSIS — Z5112 Encounter for antineoplastic immunotherapy: Secondary | ICD-10-CM | POA: Diagnosis not present

## 2019-11-23 DIAGNOSIS — C50812 Malignant neoplasm of overlapping sites of left female breast: Secondary | ICD-10-CM

## 2019-11-23 DIAGNOSIS — Z170 Estrogen receptor positive status [ER+]: Secondary | ICD-10-CM

## 2019-11-23 DIAGNOSIS — Z171 Estrogen receptor negative status [ER-]: Secondary | ICD-10-CM

## 2019-11-23 DIAGNOSIS — C50412 Malignant neoplasm of upper-outer quadrant of left female breast: Secondary | ICD-10-CM

## 2019-11-23 MED ORDER — PEGFILGRASTIM INJECTION 6 MG/0.6ML ~~LOC~~
PREFILLED_SYRINGE | SUBCUTANEOUS | Status: AC
  Filled 2019-11-23: qty 0.6

## 2019-11-23 MED ORDER — PEGFILGRASTIM INJECTION 6 MG/0.6ML ~~LOC~~
6.0000 mg | PREFILLED_SYRINGE | Freq: Once | SUBCUTANEOUS | Status: AC
  Administered 2019-11-23: 6 mg via SUBCUTANEOUS

## 2019-11-23 MED ORDER — GADOBUTROL 1 MMOL/ML IV SOLN
10.0000 mL | Freq: Once | INTRAVENOUS | Status: AC | PRN
  Administered 2019-11-23: 10 mL via INTRAVENOUS

## 2019-11-23 NOTE — Progress Notes (Signed)
The patient was seen in the infusion room today as she was receiving her chemotherapy.  She was noted to have an elevated blood pressure of 159/119.  Her pulse was 57 and her oxygen saturation was 100% on room air.  The patient is on myocarditis HCT which she did not take today prior to her appointment.  Her review of systems was negative.  She was given clonidine 0.1 mg p.o. x1 with a repeat blood pressure returning at 159/93.  She completed her infusion and was discharged home with instructions to take her blood pressure medication when she arrived home.  She expressed understanding and agreement with this plan.  Sandi Mealy, MHS, PA-C Physician Assistant

## 2019-11-23 NOTE — Patient Instructions (Signed)

## 2019-11-24 ENCOUNTER — Encounter: Payer: Self-pay | Admitting: *Deleted

## 2019-11-24 ENCOUNTER — Encounter: Payer: 59 | Admitting: Cardiothoracic Surgery

## 2019-11-24 ENCOUNTER — Telehealth: Payer: Self-pay

## 2019-11-24 ENCOUNTER — Encounter (HOSPITAL_BASED_OUTPATIENT_CLINIC_OR_DEPARTMENT_OTHER): Payer: 59 | Admitting: Internal Medicine

## 2019-11-24 ENCOUNTER — Encounter (HOSPITAL_COMMUNITY): Payer: Self-pay | Admitting: Emergency Medicine

## 2019-11-24 ENCOUNTER — Other Ambulatory Visit: Payer: Self-pay

## 2019-11-24 ENCOUNTER — Emergency Department (HOSPITAL_COMMUNITY)
Admission: EM | Admit: 2019-11-24 | Discharge: 2019-11-24 | Disposition: A | Discharge status: Home / Self Care | Payer: 59 | Attending: Emergency Medicine | Admitting: Emergency Medicine

## 2019-11-24 DIAGNOSIS — R310 Gross hematuria: Secondary | ICD-10-CM | POA: Diagnosis not present

## 2019-11-24 DIAGNOSIS — Z7984 Long term (current) use of oral hypoglycemic drugs: Secondary | ICD-10-CM | POA: Diagnosis not present

## 2019-11-24 DIAGNOSIS — R1030 Lower abdominal pain, unspecified: Secondary | ICD-10-CM | POA: Insufficient documentation

## 2019-11-24 DIAGNOSIS — I10 Essential (primary) hypertension: Secondary | ICD-10-CM | POA: Insufficient documentation

## 2019-11-24 DIAGNOSIS — C50912 Malignant neoplasm of unspecified site of left female breast: Secondary | ICD-10-CM | POA: Insufficient documentation

## 2019-11-24 DIAGNOSIS — E119 Type 2 diabetes mellitus without complications: Secondary | ICD-10-CM | POA: Diagnosis not present

## 2019-11-24 DIAGNOSIS — Z79899 Other long term (current) drug therapy: Secondary | ICD-10-CM | POA: Insufficient documentation

## 2019-11-24 DIAGNOSIS — Z96652 Presence of left artificial knee joint: Secondary | ICD-10-CM | POA: Insufficient documentation

## 2019-11-24 DIAGNOSIS — R319 Hematuria, unspecified: Secondary | ICD-10-CM | POA: Diagnosis present

## 2019-11-24 LAB — COMPREHENSIVE METABOLIC PANEL
ALT: 22 U/L (ref 0–44)
AST: 22 U/L (ref 15–41)
Albumin: 3.4 g/dL — ABNORMAL LOW (ref 3.5–5.0)
Alkaline Phosphatase: 46 U/L (ref 38–126)
Anion gap: 9 (ref 5–15)
BUN: 30 mg/dL — ABNORMAL HIGH (ref 8–23)
CO2: 26 mmol/L (ref 22–32)
Calcium: 9.3 mg/dL (ref 8.9–10.3)
Chloride: 101 mmol/L (ref 98–111)
Creatinine, Ser: 0.86 mg/dL (ref 0.44–1.00)
GFR calc Af Amer: >60 mL/min (ref >60)
GFR calc non Af Amer: >60 mL/min (ref >60)
Glucose, Bld: 237 mg/dL — ABNORMAL HIGH (ref 70–99)
Potassium: 3.3 mmol/L — ABNORMAL LOW (ref 3.5–5.1)
Sodium: 136 mmol/L (ref 135–145)
Total Bilirubin: 0.4 mg/dL (ref 0.3–1.2)
Total Protein: 7.9 g/dL (ref 6.5–8.1)

## 2019-11-24 LAB — CBC WITH DIFFERENTIAL/PLATELET
Abs Immature Granulocytes: 1.93 10*3/uL — ABNORMAL HIGH (ref 0.00–0.07)
Basophils Absolute: 0.1 10*3/uL (ref 0.0–0.1)
Basophils Relative: 0 %
Eosinophils Absolute: 0 10*3/uL (ref 0.0–0.5)
Eosinophils Relative: 0 %
HCT: 41.4 % (ref 36.0–46.0)
Hemoglobin: 12.7 g/dL (ref 12.0–15.0)
Immature Granulocytes: 7 %
Lymphocytes Relative: 1 %
Lymphs Abs: 0.4 10*3/uL — ABNORMAL LOW (ref 0.7–4.0)
MCH: 27.1 pg (ref 26.0–34.0)
MCHC: 30.7 g/dL (ref 30.0–36.0)
MCV: 88.5 fL (ref 80.0–100.0)
Monocytes Absolute: 0.5 10*3/uL (ref 0.1–1.0)
Monocytes Relative: 2 %
Neutro Abs: 26.2 10*3/uL — ABNORMAL HIGH (ref 1.7–7.7)
Neutrophils Relative %: 90 %
Platelets: 238 10*3/uL (ref 150–400)
RBC: 4.68 MIL/uL (ref 3.87–5.11)
RDW: 14.6 % (ref 11.5–15.5)
WBC: 29.1 10*3/uL — ABNORMAL HIGH (ref 4.0–10.5)
nRBC: 0 % (ref 0.0–0.2)

## 2019-11-24 LAB — URINALYSIS, ROUTINE W REFLEX MICROSCOPIC
RBC / HPF: >50 RBC/hpf — ABNORMAL HIGH (ref 0–5)
WBC, UA: >50 WBC/hpf — ABNORMAL HIGH (ref 0–5)

## 2019-11-24 LAB — LIPASE, BLOOD: Lipase: 17 U/L (ref 11–51)

## 2019-11-24 LAB — URINE CULTURE

## 2019-11-24 MED ORDER — CEPHALEXIN 500 MG PO CAPS: 500.0000 mg | ORAL_CAPSULE | Freq: Four times a day (QID) | ORAL | 0 refills | Status: DC

## 2019-11-24 MED ORDER — HYOSCYAMINE SULFATE 0.125 MG PO TBDP
0.1250 mg | ORAL_TABLET | Freq: Once | ORAL | Status: AC
  Administered 2019-11-24: 0.125 mg via SUBLINGUAL
  Filled 2019-11-24 (×2): qty 1

## 2019-11-24 MED ORDER — OXYCODONE-ACETAMINOPHEN 5-325 MG PO TABS
2.0000 | ORAL_TABLET | Freq: Once | ORAL | Status: AC
  Administered 2019-11-24: 2 via ORAL
  Filled 2019-11-24: qty 2

## 2019-11-24 MED ORDER — SODIUM CHLORIDE 0.9 % IV SOLN
1.0000 g | Freq: Once | INTRAVENOUS | Status: AC
  Administered 2019-11-24: 1 g via INTRAVENOUS
  Filled 2019-11-24: qty 10

## 2019-11-24 MED ORDER — SODIUM CHLORIDE 0.9% FLUSH: 3.0000 mL | Freq: Once | INTRAVENOUS | Status: DC

## 2019-11-24 MED ORDER — ONDANSETRON 8 MG PO TBDP
8.0000 mg | ORAL_TABLET | Freq: Once | ORAL | Status: AC
  Administered 2019-11-24: 8 mg via ORAL
  Filled 2019-11-24: qty 1

## 2019-11-24 MED ORDER — SODIUM CHLORIDE 0.9 % IV BOLUS
1000.0000 mL | Freq: Once | INTRAVENOUS | Status: AC
  Administered 2019-11-24: 1000 mL via INTRAVENOUS

## 2019-11-24 MED ORDER — HYOSCYAMINE SULFATE 0.125 MG PO TBDP: 0.1250 mg | ORAL_TABLET | ORAL | 0 refills | Status: DC | PRN

## 2019-11-24 NOTE — Telephone Encounter (Signed)
RN spoke with patient regarding concerns with hematuria.  Pt reports she was in ED due to having blood in urine and wanted to follow up with MD.    RN reviewed ED notes, pt treated with antibiotics, pain medication, and given medication for bladder spasms.  Pt reports that pain is easing off, however blood is still present and frequency.     Pt denies fever, able to tolerate fluids PO.  RN encouraged patient to drink 64 oz of fluids daily as tolerated.  RN educated on medication usage and purpose, pt verbalized understanding.   Pt aware culture is pending, and will be reviewed upon final results.

## 2019-11-24 NOTE — ED Notes (Signed)
Pt returns from restroom stating she can not leave because the pain in her abdomen is to severe. MD made aware Anaspaz given. Pt stated she does not want to leave until she has some relief.

## 2019-11-24 NOTE — ED Notes (Signed)
Pt resting without needs at this time.

## 2019-11-24 NOTE — ED Notes (Signed)
Pt remains in the restroom at this time

## 2019-11-24 NOTE — ED Triage Notes (Addendum)
Patient complaining of blood in urine. Patient states it started last night. Patient states and hour ago it was a deep red in the toilet. Patient is also complaining of lower abdominal pain.

## 2019-11-24 NOTE — ED Notes (Signed)
Pt ambulatory to restroom without assistance 
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
## 2019-11-27 ENCOUNTER — Other Ambulatory Visit: Payer: Self-pay | Admitting: *Deleted

## 2019-11-27 DIAGNOSIS — N3001 Acute cystitis with hematuria: Secondary | ICD-10-CM

## 2019-11-27 DIAGNOSIS — Z171 Estrogen receptor negative status [ER-]: Secondary | ICD-10-CM

## 2019-11-27 DIAGNOSIS — C50812 Malignant neoplasm of overlapping sites of left female breast: Secondary | ICD-10-CM

## 2019-11-27 NOTE — Progress Notes (Signed)
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 dilitation. Venous: The inferior vena cava is normal in size with greater than 50% respiratory variability, suggesting right atrial pressure of 3 mmHg. IAS/Shunts: No atrial level shunt detected by color flow Doppler.  LEFT VENTRICLE PLAX 2D LVIDd:         3.70 cm  Diastology LVIDs:         2.70 cm  LV e' lateral:   7.62 cm/s LV PW:         1.10 cm  LV E/e' lateral: 11.1 LV IVS:        1.10 cm  LV e' medial:    4.46 cm/s LVOT diam:     1.90 cm  LV E/e' medial:  18.9 LV SV:         31 ml LV SV Index:   13.92    2D Longitudinal Strain LVOT Area:     2.84 cm 2D Strain GLS Avg:     -20.0 %  RIGHT VENTRICLE RV S prime:     13.20 cm/s TAPSE (M-mode): 1.5 cm LEFT ATRIUM             Index       RIGHT ATRIUM           Index LA diam:        3.50 cm 1.74 cm/m  RA Area:     13.00 cm LA Vol (A2C):   56.2 ml 27.87 ml/m RA Volume:   29.50 ml  14.63 ml/m LA Vol (A4C):   59.0 ml 29.26 ml/m LA Biplane Vol: 59.8 ml 29.66 ml/m  AORTIC VALVE LVOT Vmax:   121.00 cm/s LVOT Vmean:  81.000 cm/s LVOT VTI:    0.271 m  AORTA Ao Root diam: 2.80 cm MITRAL VALVE                        TRICUSPID  VALVE MV Area (PHT): 3.23 cm             TR Peak grad:   28.9 mmHg MV PHT:        68.15 msec           TR Vmax:        269.00 cm/s MV Decel Time: 235 msec MV E velocity: 84.50 cm/s 103 cm/s  SHUNTS MV A velocity: 89.40 cm/s 70.3 cm/s Systemic VTI:  0.27 m MV E/A ratio:  0.95       1.5       Systemic Diam: 1.90 cm  Glori Bickers MD Electronically signed by Glori Bickers MD Signature Date/Time: 11/10/2019/11:41:58 AM    Final      ASSESSMENT: 64 y.o.  [place] woman with  A: INVASIVE DUCTAL CARCINOMA LEFT BREAST (1)  status post left lumpectomy and sentinel lymph node dissection April of 2012 for a T1b N1(mic) stage IB invasive ductal carcinoma, grade 1, estrogen receptor 82% and progesterone receptor 92% positive, with no HER-2 amplification, and an MIB-1-1 of 17%,   (2)  The patient's Oncotype DX score of 21 predicted a 13% risk of distant recurrence after 5 years of tamoxifen.  (3)  status post radiation completed August of 2012,   (4)  on tamoxifen from September of 2012 to April 2014  (5) the plan had been to initiate anastrozole in April 2014, but the patient had a menstrual cycle in May 2014, and resumed tamoxifen.  (a) discontinued tamoxifen on her own initiative June 2015 because of "aches and pains".  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, grade 3, estrogen and progesterone receptor negative  (9) definitive surgery delayed (see discussion in 11/03/2019 note)  (10) genetics testing 06/14/2019 through the Multi-Gene Panel offered by [facility] found no deleterious mutations in  AIP, ALK, APC, ATM, AXIN2,BAP1,  BARD1, BLM, BMPR1A, BRCA1, BRCA2, BRIP1, CASR, CDC73, CDH1, CDK4, CDKN1B, CDKN1C, CDKN2A (p14ARF), CDKN2A (p16INK4a), CEBPA, CHEK2, CTNNA1, DICER1, DIS3L2, EGFR (c.2369C>T, p.Thr790Met variant only), EPCAM (Deletion/duplication testing only), FH, FLCN, GATA2, GPC3, GREM1 (Promoter region deletion/duplication testing only), HOXB13 (c.251G>A, p.Gly84Glu), HRAS, KIT, MAX, MEN1, MET, MITF (c.952G>A, p.Glu318Lys variant only), MLH1, MSH2, MSH3, MSH6, MUTYH, NBN, NF1, NF2, NTHL1, PALB2, PDGFRA, PHOX2B, PMS2, POLD1, POLE, POT1, PRKAR1A, PTCH1, PTEN, RAD50, RAD51C, RAD51D, RB1, RECQL4, RET, RNF43, RUNX1, SDHAF2, SDHA (sequence changes only), SDHB, SDHC, SDHD, SMAD4, SMARCA4, SMARCB1, SMARCE1, STK11, SUFU, TERC, TERT, TMEM127, TP53, TSC1, TSC2, VHL, WRN and WT1.    C: METASTATIC BREAST CANCER: JAN 2021 (11) left axillary lymph node biopsy 10/30/2019 documents invasive mammary carcinoma, grade 3, estrogen receptor positive (80%, weak), progesterone receptor negative, HER-2 amplified (3+) MIB-170%  (a) breast MRI 11/23/2019 shows 3.6 cm non-mass-like enhancement in the left breast, with a second more clumped area measuring 4.8 cm, and at least 5 morphologically abnormal left axillary lymph node.  There is a left subpectoral lymph node and a left internal mammary lymph node noted as well.  (b) Chest CT W/C and bone scan 11/13/2019 show prevascular adenopathy (stage IV), no lung, liver or bone metastases; left breast mass and regional nodes  (c) PET 11/20/2019 shows prevascular node SUV of 22, bilateral paratracheal nodes with SUV 6-7  (12) neoadjuvant chemotherapy will consist of trastuzumab (Ogivri), Pertuzumab, carboplatin, docetaxel every 21 days x 6, starting 11/21/2019  (13) anti-HER-2 treatment to be continued indefinitely  (a) echo 11/09/2019 shows an ejection fraction in the 60-65% range   (14) surgery to follow as appropriate  (15) adjuvant radiation as  appropriate   PLAN: Teneshia did well with the actual chemo although the growth factor caused her the flulike symptoms that many of our patients have.  Quite aside from that she had gross hematuria and was seen in the emergency room as noted above for this.  She is on a bladder spasm medication and that is making it difficult for her to urinate.  She may well be dehydrated also as her blood pressure is very low today.  She is on blood pressure medication and a diuretic.  We do not have her lab work yet.  It is being drawn at this time.  Once she is accessed she will go to the back and receive a liter of normal saline.  At that time I will review her lab work and addend this note.  ADDENDUM: She was already feeling better by the time I got  back to the infusion area where she is receiving a liter of saline.  She will be stopping her blood pressure medication.  I have strongly encouraged her to drink a minimum of a quart of liquid daily.  I have also set her up for fluids tomorrow unless she feels she does not need to come in for that.  We reviewed her lab work which so far is excellent.  She was not able to give [place] a urine sample today.  I have asked her to stop her "antispasm" medication since it does seem to be interfering with her ability to void normally.  We discussed several issues related to disability and she certainly will qualify for permanent disability if she wishes to apply for that.  At this point she is considering temporary and then possibly moving to permanent.  Total time this encounter 40 minutes.Chauncey Cruel, MD   11/28/2019 Oncology and Hematology [facility] [place] Tel. [phone]  Joylene Igo [phone]   I, Wilburn Mylar, am acting as scribe for Dr. [name]. Ayliana Casciano.  I, Lurline Del MD, have reviewed the above documentation for accuracy and completeness, and I agree with the above.   *Total Encounter Time as defined by the  Centers for Medicare and Medicaid Services includes, in addition to the face-to-face time of a patient visit (documented in the note above) non-face-to-face time: obtaining and reviewing outside history, ordering and reviewing medications, tests or procedures, care coordination (communications with other health care professionals or caregivers) and documentation in the medical record.

## 2019-11-28 ENCOUNTER — Inpatient Hospital Stay: Payer: 59

## 2019-11-28 ENCOUNTER — Encounter: Payer: Self-pay | Admitting: *Deleted

## 2019-11-28 ENCOUNTER — Encounter: Payer: Self-pay | Admitting: Oncology

## 2019-11-28 ENCOUNTER — Inpatient Hospital Stay (HOSPITAL_BASED_OUTPATIENT_CLINIC_OR_DEPARTMENT_OTHER): Payer: 59 | Admitting: Oncology

## 2019-11-28 ENCOUNTER — Other Ambulatory Visit: Payer: Self-pay

## 2019-11-28 VITALS — BP 67/36 | HR 88 | Temp 97.8°F | Resp 18 | Ht 59.0 in

## 2019-11-28 VITALS — BP 105/70 | HR 74

## 2019-11-28 DIAGNOSIS — C541 Malignant neoplasm of endometrium: Secondary | ICD-10-CM

## 2019-11-28 DIAGNOSIS — C50812 Malignant neoplasm of overlapping sites of left female breast: Secondary | ICD-10-CM

## 2019-11-28 DIAGNOSIS — C50112 Malignant neoplasm of central portion of left female breast: Secondary | ICD-10-CM

## 2019-11-28 DIAGNOSIS — Z171 Estrogen receptor negative status [ER-]: Secondary | ICD-10-CM

## 2019-11-28 DIAGNOSIS — Z95828 Presence of other vascular implants and grafts: Secondary | ICD-10-CM

## 2019-11-28 DIAGNOSIS — Z5112 Encounter for antineoplastic immunotherapy: Secondary | ICD-10-CM | POA: Diagnosis not present

## 2019-11-28 DIAGNOSIS — N3001 Acute cystitis with hematuria: Secondary | ICD-10-CM

## 2019-11-28 LAB — CBC WITH DIFFERENTIAL (CANCER CENTER ONLY)
Abs Immature Granulocytes: 0.27 10*3/uL — ABNORMAL HIGH (ref 0.00–0.07)
Basophils Absolute: 0.1 10*3/uL (ref 0.0–0.1)
Basophils Relative: 1 %
Eosinophils Absolute: 0 10*3/uL (ref 0.0–0.5)
Eosinophils Relative: 0 %
HCT: 41.9 % (ref 36.0–46.0)
Hemoglobin: 13.5 g/dL (ref 12.0–15.0)
Immature Granulocytes: 5 %
Lymphocytes Relative: 31 %
Lymphs Abs: 1.6 10*3/uL (ref 0.7–4.0)
MCH: 27.7 pg (ref 26.0–34.0)
MCHC: 32.2 g/dL (ref 30.0–36.0)
MCV: 85.9 fL (ref 80.0–100.0)
Monocytes Absolute: 1.2 10*3/uL — ABNORMAL HIGH (ref 0.1–1.0)
Monocytes Relative: 24 %
Neutro Abs: 1.9 10*3/uL (ref 1.7–7.7)
Neutrophils Relative %: 39 %
Platelet Count: 155 10*3/uL (ref 150–400)
RBC: 4.88 MIL/uL (ref 3.87–5.11)
RDW: 14 % (ref 11.5–15.5)
WBC Count: 5.1 10*3/uL (ref 4.0–10.5)
nRBC: 0 % (ref 0.0–0.2)

## 2019-11-28 LAB — URINALYSIS, COMPLETE (UACMP) WITH MICROSCOPIC
Bacteria, UA: NONE SEEN
Bilirubin Urine: NEGATIVE
Glucose, UA: 150 mg/dL — AB
Ketones, ur: NEGATIVE mg/dL
Leukocytes,Ua: NEGATIVE
Nitrite: NEGATIVE
Protein, ur: 30 mg/dL — AB
Specific Gravity, Urine: 1.011 (ref 1.005–1.030)
pH: 5 (ref 5.0–8.0)

## 2019-11-28 LAB — CMP (CANCER CENTER ONLY)
ALT: 24 U/L (ref 0–44)
AST: 17 U/L (ref 15–41)
Albumin: 3.6 g/dL (ref 3.5–5.0)
Alkaline Phosphatase: 60 U/L (ref 38–126)
Anion gap: 13 (ref 5–15)
BUN: 37 mg/dL — ABNORMAL HIGH (ref 8–23)
CO2: 27 mmol/L (ref 22–32)
Calcium: 9.4 mg/dL (ref 8.9–10.3)
Chloride: 94 mmol/L — ABNORMAL LOW (ref 98–111)
Creatinine: 2.43 mg/dL — ABNORMAL HIGH (ref 0.44–1.00)
GFR, Est AFR Am: 24 mL/min — ABNORMAL LOW (ref >60)
GFR, Estimated: 21 mL/min — ABNORMAL LOW (ref >60)
Glucose, Bld: 155 mg/dL — ABNORMAL HIGH (ref 70–99)
Potassium: 3.1 mmol/L — ABNORMAL LOW (ref 3.5–5.1)
Sodium: 134 mmol/L — ABNORMAL LOW (ref 135–145)
Total Bilirubin: 0.3 mg/dL (ref 0.3–1.2)
Total Protein: 7.7 g/dL (ref 6.5–8.1)

## 2019-11-28 MED ORDER — SODIUM CHLORIDE 0.9% FLUSH
10.0000 mL | Freq: Once | INTRAVENOUS | Status: AC
  Administered 2019-11-28: 10 mL
  Filled 2019-11-28: qty 10

## 2019-11-28 MED ORDER — SODIUM CHLORIDE 0.9 % IV SOLN
INTRAVENOUS | Status: AC
  Filled 2019-11-28 (×2): qty 250

## 2019-11-28 MED ORDER — HEPARIN SOD (PORK) LOCK FLUSH 100 UNIT/ML IV SOLN
500.0000 [IU] | Freq: Once | INTRAVENOUS | Status: AC
  Administered 2019-11-28: 500 [IU]
  Filled 2019-11-28: qty 5

## 2019-11-28 MED ORDER — HEPARIN SOD (PORK) LOCK FLUSH 100 UNIT/ML IV SOLN
500.0000 [IU] | Freq: Once | INTRAVENOUS | Status: DC
  Filled 2019-11-28: qty 5

## 2019-11-28 NOTE — Progress Notes (Signed)
Pt's injection was changed from Fulphila to Neulasta so I enrolledherin the [employer] First Step program forNeulastafor $10,000 per calendar yearfrom2/9/21.  Pt will pay $0 for herfirstdose or cycleand $55for each subsequent dose or cycle.

## 2019-11-28 NOTE — Patient Instructions (Signed)

## 2019-11-29 ENCOUNTER — Encounter: Payer: Self-pay | Admitting: *Deleted

## 2019-11-29 LAB — URINE CULTURE: Culture: NO GROWTH

## 2019-11-30 ENCOUNTER — Other Ambulatory Visit: Payer: Self-pay | Admitting: Oncology

## 2019-12-01 ENCOUNTER — Telehealth: Payer: Self-pay | Admitting: *Deleted

## 2019-12-01 ENCOUNTER — Other Ambulatory Visit: Payer: Self-pay

## 2019-12-01 ENCOUNTER — Encounter (HOSPITAL_BASED_OUTPATIENT_CLINIC_OR_DEPARTMENT_OTHER): Payer: 59 | Attending: Internal Medicine | Admitting: Internal Medicine

## 2019-12-01 DIAGNOSIS — E11621 Type 2 diabetes mellitus with foot ulcer: Secondary | ICD-10-CM | POA: Diagnosis not present

## 2019-12-01 DIAGNOSIS — C50912 Malignant neoplasm of unspecified site of left female breast: Secondary | ICD-10-CM | POA: Insufficient documentation

## 2019-12-01 DIAGNOSIS — Z96653 Presence of artificial knee joint, bilateral: Secondary | ICD-10-CM | POA: Diagnosis not present

## 2019-12-01 DIAGNOSIS — I87311 Chronic venous hypertension (idiopathic) with ulcer of right lower extremity: Secondary | ICD-10-CM | POA: Diagnosis not present

## 2019-12-01 DIAGNOSIS — S91001A Unspecified open wound, right ankle, initial encounter: Secondary | ICD-10-CM | POA: Insufficient documentation

## 2019-12-01 DIAGNOSIS — X58XXXA Exposure to other specified factors, initial encounter: Secondary | ICD-10-CM | POA: Insufficient documentation

## 2019-12-01 DIAGNOSIS — M1990 Unspecified osteoarthritis, unspecified site: Secondary | ICD-10-CM | POA: Diagnosis not present

## 2019-12-01 DIAGNOSIS — L97312 Non-pressure chronic ulcer of right ankle with fat layer exposed: Secondary | ICD-10-CM | POA: Insufficient documentation

## 2019-12-04 NOTE — Telephone Encounter (Signed)
No entry 

## 2019-12-04 NOTE — Progress Notes (Signed)
[name] ([id number]) Visit Report for 12/01/2019 Debridement Details Patient Name: [name]. Date of Service: 12/01/2019 10:30 AM Medical Record [id number] Patient Account Number: [id number] Date of Birth/Sex: Treating RN: 1956-03-23 (64 y.o. [name] Primary Care Provider: Cathlean Cower Other Clinician: Referring Provider: Treating Provider/Extender:Danyelle Brookover, Arta Bruce, Casper Harrison in Treatment: 4 Debridement Performed for Wound #1 Right Achilles Assessment: Performed By: Physician Ricard Dillon., MD Debridement Type: Debridement Severity of Tissue Pre Fat layer exposed Debridement: Level of Consciousness (Pre- Awake and Alert procedure): Pre-procedure Verification/Time Out Taken: Yes - 11:54 Start Time: 11:54 Total Area Debrided (L x W): 1.5 (cm) x 0.3 (cm) = 0.45 (cm) Tissue and other material Viable, Non-Viable, Eschar, Subcutaneous debrided: Level: Skin/Subcutaneous Tissue Debridement Description: Excisional Instrument: Curette Bleeding: Minimum Hemostasis Achieved: Pressure End Time: 11:55 Procedural Pain: 0 Post Procedural Pain: 0 Response to Treatment: Procedure was tolerated well Level of Consciousness Awake and Alert (Post-procedure): Post Debridement Measurements of Total Wound Length: (cm) 1.5 Width: (cm) 0.3 Depth: (cm) 0.1 Volume: (cm) 0.035 Character of Wound/Ulcer Post Improved Debridement: Severity of Tissue Post Debridement: Fat layer exposed Post Procedure Diagnosis Same as Pre-procedure Electronic Signature(s) Signed: 12/01/2019 5:45:04 PM By: Linton Ham MD Signed: 12/04/2019 6:12:41 PM By: Kela Millin Entered By: Linton Ham on 12/01/2019 12:59:16 -------------------------------------------------------------------------------- HPI Details Patient Name: [name] Date of Service: 12/01/2019 10:30 AM Medical Record [id number] Patient Account Number:  [id number] Date of Birth/Sex: Treating RN: [birth year] (64 y.o. [name] Primary Care Provider: Cathlean Cower Other Clinician: Referring Provider: Treating Provider/Extender:Lenoir Facchini, Arta Bruce, Casper Harrison in Treatment: 4 History of Present Illness HPI Description: 11/02/2019 ADMISSION This is a 64 year old woman who has a wound on the right posterior Achilles area towards the distal end of the Achilles tendon surgery scars. She tells me that roughly 2-1/2 to 3 weeks ago she was picking at some dry skin in the area pulled it off and that is how the wound opened up. She had an original spontaneous Achilles tendon rupture in the early 1990s that was repaired and then very shortly thereafter it reruptured and she required another surgery. She has not had any problems with this since then. The wound itself currently has episodic sharp pain some swelling. She was put on Augmentin by her primary physician on 10/30/2019 for possible coexistent cellulitis. She used hydrogen peroxide for a while with topical antibiotics. Mostly she has been using topical antibiotics as somebody told her that hydrogen peroxide was not good to use. 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 Surgery was in [place], bilateral total knee replacements. ABIs in our clinic on the right were 1.09 1/21; patient here with a linear wound in the right Achilles area. Her wrap fell down she did not call us to replace it. She has questions about her compression wrap necessity. Finally she asked if the wound could be sutured The patient is under a lot of pressure with initiation of chemotherapy she is going to have a port placed etc. 1/28; linear  wound in the right Achilles area in the setting of previous surgery. She is going for a port for chemotherapy for her breast cancer. She has apparently made herself an appointment with orthopedic surgery. I am  not sure who she has made an appointment with at this point. 2/12 linear wound in the right Achilles is come in in terms of length. This is in the setting of previous surgery in this area The patient is undergoing chemotherapy for breast cancer and having significant side effects [diarrhea] I am trying not to bring in her in too often as long as her wound is contracted Electronic Signature(s) Signed: 12/01/2019 5:45:04 PM By: Linton Ham MD Entered By: Linton Ham on 12/01/2019 13:03:00 -------------------------------------------------------------------------------- Physical Exam Details Patient Name: [name] Date of Service: 12/01/2019 10:30 AM Medical Record [id number] Patient Account Number: [id number] Date of Birth/Sex: Treating RN: 1956-05-18 (64 y.o. [name] Primary Care Provider: Cathlean Cower Other Clinician: Referring Provider: Treating Provider/Extender:Whitley Strycharz, Arta Bruce, Casper Harrison in Treatment: 4 Constitutional Patient is hypertensive.. Pulse regular and within target range for patient.[name] Respirations regular, non-labored and within target range.. Temperature is normal and within the target range for the patient.[name] Appears in no distress. Notes Wound exam; small linear wound in the lower part of her Achilles tendon surgical scar. She had debris on the surface which I removed with a #3 curette which included some eschar and subcutaneous tissue but overall I think this is coming in nicely and post debridement the tissue looks healthy Electronic Signature(s) Signed: 12/01/2019 5:45:04 PM By: Linton Ham MD Entered By: Linton Ham on 12/01/2019  13:04:11 -------------------------------------------------------------------------------- Physician Orders Details Patient Name: [name] Date of Service: 12/01/2019 10:30 AM Medical Record [id number] Patient Account Number: [id number] Date of Birth/Sex: Treating RN: 1956-07-22 (64 y.o. [name] Primary Care Provider: Cathlean Cower Other Clinician: Referring Provider: Treating Provider/Extender:Kimori Tartaglia, Arta Bruce, Casper Harrison in Treatment: 4 Verbal / Phone Orders: No Diagnosis Coding ICD-10 Coding Code Description [phone] Non-pressure chronic ulcer of right ankle with fat layer exposed Disruption of external operation (surgical) wound, not elsewhere classified, subsequent T81.31XD encounter I87.311 Chronic venous hypertension (idiopathic) with ulcer of right lower extremity E11.622 Type 2 diabetes mellitus with other skin ulcer Follow-up Appointments Return appointment in 3 weeks. Dressing Change Frequency Wound #1 Right Achilles Change Dressing every other day. Skin Barriers/Peri-Wound Care Wound #1 Right Achilles Moisturizing lotion Wound Cleansing Wound #1 Right Achilles Clean wound with Normal Saline. - or wound cleanser Primary Wound Dressing Wound #1 Right Achilles Hydrofera Blue - moisten with normal saline or [place] Jelly Secondary Dressing Wound #1 Right Achilles Foam Border - or large bandaid. Edema Control Avoid standing for long periods of time Elevate legs to the level of the heart or above for 30 minutes daily and/or when sitting, a frequency of: - throughout the day Exercise regularly Support Garment 20-30 mm/Hg pressure to: - patient to order compression stockings from [employer]. Once arrive remove compression wrap, get into shower , wash wound, and apply your compression stocking in the morning and remove at night. Electronic Signature(s) Signed: 12/01/2019 5:45:04 PM By: Linton Ham MD Signed: 12/04/2019 6:12:41 PM  By: Kela Millin Entered By: Kela Millin on 12/01/2019 11:59:40 -------------------------------------------------------------------------------- Problem List Details Patient Name: [name] Date of Service: 12/01/2019 10:30 AM Medical Record [id number] Patient Account Number: [id number] Date of Birth/Sex: Treating RN: 20-Jun-1956 (64 y.o. [name] Primary Care Provider: Cathlean Cower Other Clinician: Referring Provider: Treating Provider/Extender:Kylo Gavin, Arta Bruce, Casper Harrison in Treatment: 4 Active Problems ICD-10 Evaluated Encounter Code Description Active Date Today Diagnosis L97.312 Non-pressure chronic ulcer of right ankle with fat layer 11/02/2019 No Yes exposed T81.31XD Disruption of external operation (surgical) wound, not  11/02/2019 No Yes elsewhere classified, subsequent encounter I87.311 Chronic venous hypertension (idiopathic) with ulcer of 11/02/2019 No Yes right lower extremity E11.622 Type 2 diabetes mellitus with other skin ulcer 11/02/2019 No Yes Inactive Problems Resolved Problems Electronic Signature(s) Signed: 12/01/2019 5:45:04 PM By: Linton Ham MD Entered By: Linton Ham on 12/01/2019 12:58:48 -------------------------------------------------------------------------------- Progress Note Details Patient Name: [name] Date of Service: 12/01/2019 10:30 AM Medical Record [id number] Patient Account Number: [id number] Date of Birth/Sex: Treating RN: 01/10/1956 (64 y.o. [name] Primary Care Provider: Cathlean Cower Other Clinician: Referring Provider: Treating Provider/Extender:Kiran Carline, Arta Bruce, Casper Harrison in Treatment: 4 Subjective History of Present Illness (HPI) 11/02/2019 ADMISSION This is a 64 year old woman who has a wound on the right posterior Achilles area towards the distal end of the Achilles tendon surgery scars. She tells me that roughly 2-1/2 to 3 weeks ago she was  picking at some dry skin in the area pulled it off and that is how the wound opened up. She had an original spontaneous Achilles tendon rupture in the early 1990s that was repaired and then very shortly thereafter it reruptured and she required another surgery. She has not had any problems with this since then. The wound itself currently has episodic sharp pain some swelling. She was put on Augmentin by her primary physician on 10/30/2019 for possible coexistent cellulitis. She used hydrogen peroxide for a while with topical antibiotics. Mostly she has been using topical antibiotics as somebody told her that hydrogen peroxide was not good to use. 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 Surgery was in [place], bilateral total knee replacements. ABIs in our clinic on the right were 1.09 1/21; patient here with a linear wound in the right Achilles area. Her wrap fell down she did not call us to replace it. She has questions about her compression wrap necessity. Finally she asked if the wound could be sutured The patient is under a lot of pressure with initiation of chemotherapy she is going to have a port placed etc. 1/28; linear wound in the right Achilles area in the setting of previous surgery. She is going for a port for chemotherapy for her breast cancer. She has apparently made herself an appointment with orthopedic surgery. I am not sure who she has made an appointment with at this point. 2/12 linear wound in the right Achilles is come in in terms of length. This is in the setting of previous surgery in this area The patient is undergoing chemotherapy for breast cancer and having significant side effects [diarrhea]  I am trying not to bring in her in too often as long as her wound is contracted Objective Constitutional Patient is hypertensive.. Pulse regular and within target range for patient.[name] Respirations regular, non-labored and within target range.. Temperature is normal and within the target range for the patient.[name] Appears in no distress. Vitals Time Taken: 11:30 AM, Height: 59 in, Weight: 250 lbs, BMI: 50.5, Temperature: 98 F, Pulse: 70 bpm, Respiratory Rate: 18 breaths/min, Blood Pressure: 157/87 mmHg. General Notes: Wound exam; small linear wound in the lower part of her Achilles tendon surgical scar. She had debris on the surface which I removed with a #3 curette which included some eschar and subcutaneous tissue  but overall I think this is coming in nicely and post debridement the tissue looks healthy Integumentary (Hair, Skin) Wound #1 status is Open. Original cause of wound was Trauma. The wound is located on the Right Achilles. The wound measures 1.5cm length x 0.3cm width x 0.1cm depth; 0.353cm^2 area and 0.035cm^3 volume. There is Fat Layer (Subcutaneous Tissue) Exposed exposed. There is no tunneling or undermining noted. There is a medium amount of serosanguineous drainage noted. The wound margin is epibole. There is medium (34-66%) red granulation within the wound bed. There is a medium (34-66%) amount of necrotic tissue within the wound bed including Adherent Slough. Assessment Active Problems ICD-10 Non-pressure chronic ulcer of right ankle with fat layer exposed Disruption of external operation (surgical) wound, not elsewhere classified, subsequent encounter Chronic venous hypertension (idiopathic) with ulcer of right lower extremity Type 2 diabetes mellitus with other skin ulcer Procedures Wound #1 Pre-procedure diagnosis of Wound #1 is a Diabetic Wound/Ulcer of the Lower Extremity located on the Right Achilles .Severity of Tissue Pre Debridement is: Fat layer exposed. There  was a Excisional Skin/Subcutaneous Tissue Debridement with a total area of 0.45 sq cm performed by Ricard Dillon., MD. With the following instrument(s): Curette to remove Viable and Non-Viable tissue/material. Material removed includes Eschar and Subcutaneous Tissue and. No specimens were taken. A time out was conducted at 11:54, prior to the start of the procedure. A Minimum amount of bleeding was controlled with Pressure. The procedure was tolerated well with a pain level of 0 throughout and a pain level of 0 following the procedure. Post Debridement Measurements: 1.5cm length x 0.3cm width x 0.1cm depth; 0.035cm^3 volume. Character of Wound/Ulcer Post Debridement is improved. Severity of Tissue Post Debridement is: Fat layer exposed. Post procedure Diagnosis Wound #1: Same as Pre-Procedure Plan Follow-up Appointments: Return appointment in 3 weeks. Dressing Change Frequency: Wound #1 Right Achilles: Change Dressing every other day. Skin Barriers/Peri-Wound Care: Wound #1 Right Achilles: Moisturizing lotion Wound Cleansing: Wound #1 Right Achilles: Clean wound with Normal Saline. - or wound cleanser Primary Wound Dressing: Wound #1 Right Achilles: Hydrofera Blue - moisten with normal saline or [place] Jelly Secondary Dressing: Wound #1 Right Achilles: Foam Border - or large bandaid. Edema Control: Avoid standing for long periods of time Elevate legs to the level of the heart or above for 30 minutes daily and/or when sitting, a frequency of: - throughout the day Exercise regularly Support Garment 20-30 mm/Hg pressure to: - patient to order compression stockings from [facility] [facility]. Once arrive remove compression wrap, get into shower , wash wound, and apply your compression stocking in the morning and remove at night. 1. Continue with the Hydrofera Blue moistened with K-Y jelly border foam change every 2 days 2. I am trying not to bring this patient back to often as she has a  lot else going on currently with chemotherapy. Nevertheless I asked her to call us if there is problems related to the wound Electronic Signature(s) Signed: 12/01/2019 5:45:04 PM By: Linton Ham MD Entered By: Linton Ham on 12/01/2019 13:04:59 -------------------------------------------------------------------------------- SuperBill Details Patient Name: [name] Date of Service: 12/01/2019 Medical Record [id number] Patient Account Number: [id number] Date of Birth/Sex: Treating RN: Dec 14, 1955 (64 y.o. [name] Primary Care Provider: Cathlean Cower Other Clinician: Referring Provider: Treating Provider/Extender:Halana Deisher, Arta Bruce, Casper Harrison in Treatment: 4 Diagnosis Coding ICD-10 Codes Code Description [phone] Non-pressure chronic ulcer of right ankle with fat layer exposed Disruption of external operation (surgical) wound, not elsewhere classified,  subsequent T81.31XD encounter I87.311 Chronic venous hypertension (idiopathic) with ulcer of right lower extremity E11.622 Type 2 diabetes mellitus with other skin ulcer Facility Procedures CPT4 Code Description: [id number] [place] - DEB SUBQ TISSUE 20 SQ CM/< ICD-10 Diagnosis Description [id number] Non-pressure chronic ulcer of right ankle with fat la Modifier: yer exposed Quantity: 1 Physician Procedures CPT4 Code Description: [id number] - WC PHYS SUBQ TISS 20 SQ CM ICD-10 Diagnosis Description [id number] Non-pressure chronic ulcer of right ankle with fat la Modifier: yer exposed Quantity: 1 Electronic Signature(s) Signed: 12/01/2019 5:45:04 PM By: Linton Ham MD Entered By: Linton Ham on 12/01/2019 13:05:10

## 2019-12-04 NOTE — Progress Notes (Signed)
SAVAHANNA, GNEITING ([id number]) Visit Report for 12/01/2019 Arrival Information Details Patient Name: [name], [name]. Date of Service: 12/01/2019 10:30 AM Medical Record [id number] Patient Account Number: [id number] Date of Birth/Sex: Treating RN: April 10, 1956 (64 y.o. Orvan Falconer Primary Care Ilay Capshaw: Cathlean Cower Other Clinician: Referring Tamzin Bertling: Treating Vennesa Bastedo/Extender:Robson, Arta Bruce, Casper Harrison in Treatment: 4 Visit Information History Since Last Visit All ordered tests and consults were completed: No Patient Arrived: Ambulatory Added or deleted any medications: No Arrival Time: 11:30 Any new allergies or adverse reactions: No Accompanied By: self Had a fall or experienced change in No Transfer Assistance: None activities of daily living that may affect Patient Identification Verified: Yes risk of falls: Secondary Verification Process Completed: Yes Signs or symptoms of abuse/neglect since last No Patient Requires Transmission-Based No visito Precautions: Hospitalized since last visit: No Patient Has Alerts: No Implantable device outside of the clinic excluding No cellular tissue based products placed in the center since last visit: Has Dressing in Place as Prescribed: No Pain Present Now: No Electronic Signature(s) Signed: 12/01/2019 5:18:45 PM By: Carlene Coria RN Entered By: Carlene Coria on 12/01/2019 11:30:39 -------------------------------------------------------------------------------- Encounter Discharge Information Details Patient Name: [name] Date of Service: 12/01/2019 10:30 AM Medical Record [id number] Patient Account Number: [id number] Date of Birth/Sex: Treating RN: 1956/08/03 (64 y.o. Debby Bud Primary Care Irene Mitcham: Cathlean Cower Other Clinician: Referring [name] Kalmbach: Treating Kionna Brier/Extender:Robson, Arta Bruce, Casper Harrison in Treatment: 4 Encounter Discharge Information Items Post Procedure  Vitals Discharge Condition: Stable Temperature (F): 98 Ambulatory Status: Ambulatory Pulse (bpm): 70 Discharge Destination: Home Respiratory Rate (breaths/min): 18 Transportation: Private Auto Blood Pressure (mmHg): 157/87 Accompanied By: self Schedule Follow-up Appointment: Yes Clinical Summary of Care: Electronic Signature(s) Signed: 12/01/2019 5:41:48 PM By: Deon Pilling Entered By: Deon Pilling on 12/01/2019 12:12:26 -------------------------------------------------------------------------------- Lower Extremity Assessment Details Patient Name: [name] Date of Service: 12/01/2019 10:30 AM Medical Record [id number] Patient Account Number: [id number] Date of Birth/Sex: Treating RN: July 16, 1956 (64 y.o. Orvan Falconer Primary Care Kelani Robart: Cathlean Cower Other Clinician: Referring Cirilo Canner: Treating Tetsuo Coppola/Extender:Robson, Arta Bruce, Casper Harrison in Treatment: 4 Edema Assessment Assessed: [Left: No] [Right: No] Edema: [Left: Ye] [Right: s] Calf Left: Right: Point of Measurement: cm From Medial Instep cm 45 cm Ankle Left: Right: Point of Measurement: cm From Medial Instep cm 25 cm Electronic Signature(s) Signed: 12/01/2019 5:18:45 PM By: Carlene Coria RN Entered By: Carlene Coria on 12/01/2019 11:32:56 -------------------------------------------------------------------------------- Multi Wound Chart Details Patient Name: [name] Date of Service: 12/01/2019 10:30 AM Medical Record [id number] Patient Account Number: [id number] Date of Birth/Sex: Treating RN: 1956-09-07 (64 y.o. Hollie Salk, Larene Beach Primary Care Ilhan Debenedetto: Cathlean Cower Other Clinician: Referring Dewel Lotter: Treating Hajar Penninger/Extender:Robson, Arta Bruce, Casper Harrison in Treatment: 4 Vital Signs Height(in): 28 Pulse(bpm): 32 Weight(lbs): 250 Blood Pressure(mmHg): 157/87 Body Mass Index(BMI): 50 Temperature(F): 98 Respiratory 18 Rate(breaths/min): Photos: [1:No  Photos] [N/A:N/A] Wound Location: [1:Right Achilles] [N/A:N/A] Wounding Event: [1:Trauma] [N/A:N/A] Primary Etiology: [1:Diabetic Wound/Ulcer of the N/A Lower Extremity] Comorbid History: [1:Anemia, Lymphedema, Sleep Apnea, Hypertension, Type II Diabetes, Osteoarthritis, Received Radiation] [N/A:N/A] Date Acquired: [1:10/16/2019] [N/A:N/A] Weeks of Treatment: [1:4] [N/A:N/A] Wound Status: [1:Open] [N/A:N/A] Measurements L x W x D 1.5x0.3x0.1 [N/A:N/A] (cm) Area (cm) : [1:0.353] [N/A:N/A] Volume (cm) : [1:0.035] [N/A:N/A] % Reduction in Area: [1:6.40%] [N/A:N/A] % Reduction in Volume: 69.00% [N/A:N/A] Classification: [1:Grade 1] [N/A:N/A] Exudate Amount: [1:Medium] [N/A:N/A] Exudate Type: [1:Serosanguineous] [N/A:N/A] Exudate Color: [1:red, brown] [N/A:N/A] Wound Margin: [1:Epibole] [N/A:N/A] Granulation Amount: [1:Medium (34-66%)] [N/A:N/A] Granulation Quality: [1:Red] [N/A:N/A] Necrotic Amount: [1:Medium (  34-66%)] [N/A:N/A] Exposed Structures: [1:Fat Layer (Subcutaneous N/A Tissue) Exposed: Yes Fascia: No Tendon: No Muscle: No Joint: No Bone: No] Epithelialization: [1:None] [N/A:N/A] Debridement: [1:Debridement - Excisional N/A] Pre-procedure [1:11:54] [N/A:N/A] Verification/Time Out Taken: Tissue Debrided: [1:Necrotic/Eschar, Subcutaneous] [N/A:N/A] Level: [1:Skin/Subcutaneous Tissue N/A] Debridement Area (sq cm):0.45 [N/A:N/A] Instrument: [1:Curette] [N/A:N/A] Bleeding: [1:Minimum] [N/A:N/A] Hemostasis Achieved: [1:Pressure] [N/A:N/A] Procedural Pain: [1:0] [N/A:N/A] Post Procedural Pain: [1:0] [N/A:N/A] Debridement Treatment Procedure was tolerated [N/A:N/A] Response: [1:well] Post Debridement [1:1.5x0.3x0.1] [N/A:N/A] Measurements L x W x D (cm) Post Debridement [1:0.035] [N/A:N/A] Volume: (cm) Procedures Performed: Debridement [N/A:N/A] Treatment Notes Wound #1 (Right Achilles) 1. Cleanse With Wound Cleanser 2. Periwound Care Skin Prep 3. Primary Dressing  Applied Hydrofera Blue 4. Secondary Dressing Foam Border Dressing 5. Secured With Self Adhesive Bandage Notes netting Electronic Signature(s) Signed: 12/01/2019 5:45:04 PM By: Linton Ham MD Signed: 12/04/2019 6:12:41 PM By: Kela Millin Entered By: Linton Ham on 12/01/2019 12:58:55 -------------------------------------------------------------------------------- [place] Details Patient Name: [name] Date of Service: 12/01/2019 10:30 AM Medical Record [id number] Patient Account Number: [id number] Date of Birth/Sex: Treating RN: November 14, 1955 (64 y.o. Clearnce Sorrel Primary Care Shela Esses: Cathlean Cower Other Clinician: Referring Alexsus Papadopoulos: Treating Cabell Lazenby/Extender:Robson, Arta Bruce, Casper Harrison in Treatment: 4 Active Inactive Nutrition Nursing Diagnoses: Imbalanced nutrition Potential for alteratiion in Nutrition/Potential for imbalanced nutrition Goals: Patient/caregiver agrees to and verbalizes understanding of need to use nutritional supplements and/or vitamins as prescribed Date Initiated: 11/02/2019 Target Resolution Date: 12/29/2019 Goal Status: Active Patient/caregiver will maintain therapeutic glucose control Date Initiated: 11/02/2019 Target Resolution Date: 12/29/2019 Goal Status: Active Interventions: Assess HgA1c results as ordered upon admission and as needed Assess patient nutrition upon admission and as needed per policy Provide education on elevated blood sugars and impact on wound healing Provide education on nutrition Treatment Activities: Education provided on Nutrition : 11/02/2019 Notes: Wound/Skin Impairment Nursing Diagnoses: Impaired tissue integrity Knowledge deficit related to ulceration/compromised skin integrity Goals: Patient/caregiver will verbalize understanding of skin care regimen Date Initiated: 11/02/2019 Target Resolution Date: 12/01/2019 Goal Status: Active Ulcer/skin breakdown  will have a volume reduction of 30% by week 4 Date Initiated: 11/02/2019 Target Resolution Date: 12/01/2019 Goal Status: Active Interventions: Assess patient/caregiver ability to obtain necessary supplies Assess patient/caregiver ability to perform ulcer/skin care regimen upon admission and as needed Assess ulceration(s) every visit Provide education on ulcer and skin care Notes: Electronic Signature(s) Signed: 12/04/2019 6:12:41 PM By: Kela Millin Entered By: Kela Millin on 12/01/2019 11:49:48 -------------------------------------------------------------------------------- Pain Assessment Details Patient Name: [name] Date of Service: 12/01/2019 10:30 AM Medical Record [id number] Patient Account Number: [id number] Date of Birth/Sex: Treating RN: Mar 16, 1956 (64 y.o. Orvan Falconer Primary Care Tameka Hoiland: Cathlean Cower Other Clinician: Referring Lafreda Casebeer: Treating Lynise Porr/Extender:Robson, Arta Bruce, Casper Harrison in Treatment: 4 Active Problems Location of Pain Severity and Description of Pain Patient Has Paino No Site Locations Pain Management and Medication Current Pain Management: Electronic Signature(s) Signed: 12/01/2019 5:18:45 PM By: Carlene Coria RN Entered By: Carlene Coria on 12/01/2019 11:31:59 -------------------------------------------------------------------------------- Patient/Caregiver Education Details [name] 2/12/2021andnbsp10:30 Patient Name: Date of Service: V. AM Medical Record Patient Account Number: [id number] Number: Treating RN: Kela Millin Date of Birth/Gender: [birth year] (63 y.o. F) Other Clinician: Primary Care Treating Neville Route Physician: Physician/Extender: Referring Physician: Rogene Houston in Treatment: 4 Education Assessment Education Provided To: Patient Education Topics Provided Elevated Blood Sugar/ Impact on Healing: Methods:  Explain/Verbal Responses: State content correctly Nutrition: Methods: Explain/Verbal Responses: State content correctly Wound/Skin Impairment: Methods: Explain/Verbal Responses: State content correctly Electronic Signature(s)  Signed: 12/04/2019 6:12:41 PM By: Kela Millin Entered By: Kela Millin on 12/01/2019 11:50:09 -------------------------------------------------------------------------------- Wound Assessment Details Patient Name: [name] Date of Service: 12/01/2019 10:30 AM Medical Record [id number] Patient Account Number: [id number] Date of Birth/Sex: Treating RN: 28-Apr-1956 (64 y.o. Orvan Falconer Primary Care Piccola Arico: Cathlean Cower Other Clinician: Referring Emberlynn Riggan: Treating Ayjah Show/Extender:Robson, Arta Bruce, Casper Harrison in Treatment: 4 Wound Status Wound Number: 1 Primary Diabetic Wound/Ulcer of the Lower Extremity Etiology: Wound Location: Right Achilles Wound Open Wounding Event: Trauma Status: Date Acquired: 10/16/2019 Comorbid Anemia, Lymphedema, Sleep Apnea, Weeks Of Treatment: 4 History: Hypertension, Type II Diabetes, Osteoarthritis, Clustered Wound: No Received Radiation Wound Measurements Length: (cm) 1.5 Width: (cm) 0.3 Depth: (cm) 0.1 Area: (cm) 0.353 Volume: (cm) 0.035 Wound Description Classification: Grade 1 Wound Margin: Epibole Exudate Amount: Medium Exudate Type: Serosanguineous Exudate Color: red, brown Wound Bed Granulation Amount: Medium (34-66%) Granulation Quality: Red Necrotic Amount: Medium (34-66%) Necrotic Quality: Adherent Slough or After Cleansing: No Fibrino Yes Exposed Structure xposed: No r (Subcutaneous Tissue) Exposed: Yes xposed: No xposed: No posed: No osed: No % Reduction in Area: 6.4% % Reduction in Volume: 69% Epithelialization: None Tunneling: No Undermining: No Foul Od Slough/ Fascia E Fat Laye Tendon E Muscle E Joint Ex Bone Exp Treatment Notes Wound #1  (Right Achilles) 1. Cleanse With Wound Cleanser 2. Periwound Care Skin Prep 3. Primary Dressing Applied Hydrofera Blue 4. Secondary Dressing Foam Border Dressing 5. Secured With Self Adhesive Bandage Notes netting Electronic Signature(s) Signed: 12/01/2019 5:18:45 PM By: Carlene Coria RN Entered By: Carlene Coria on 12/01/2019 11:33:44 -------------------------------------------------------------------------------- Vitals Details Patient Name: [name] Date of Service: 12/01/2019 10:30 AM Medical Record [id number] Patient Account Number: [id number] Date of Birth/Sex: Treating RN: 1956-06-28 (64 y.o. Orvan Falconer Primary Care Hajime Asfaw: Cathlean Cower Other Clinician: Referring Terran Klinke: Treating Jeff Mccallum/Extender:Robson, Arta Bruce, Casper Harrison in Treatment: 4 Vital Signs Time Taken: 11:30 Temperature (F): 98 Height (in): 59 Pulse (bpm): 70 Weight (lbs): 250 Respiratory Rate (breaths/min): 18 Body Mass Index (BMI): 50.5 Blood Pressure (mmHg): 157/87 Reference Range: 80 - 120 mg / dl Electronic Signature(s) Signed: 12/01/2019 5:18:45 PM By: Carlene Coria RN Entered By: Carlene Coria on 12/01/2019 11:31:38

## 2019-12-05 ENCOUNTER — Ambulatory Visit: Payer: 59

## 2019-12-05 ENCOUNTER — Other Ambulatory Visit: Payer: 59

## 2019-12-07 ENCOUNTER — Ambulatory Visit: Payer: 59

## 2019-12-08 ENCOUNTER — Telehealth: Payer: Self-pay | Admitting: *Deleted

## 2019-12-08 MED ORDER — CHOLESTYRAMINE 4 GM/DOSE PO POWD: 4.0000 g | Freq: Two times a day (BID) | ORAL | 3 refills | Status: DC

## 2019-12-08 NOTE — Telephone Encounter (Signed)
This RN spoke with pt per MD request for contact for possible IVF today due to reported diarrhea.  Tamyiah states she has continued to have frequent watery stools since last visit.  She is using 1 imodium daily- and pepto bismol.  She is hydrating well with water and gatorade.  This RN discussed above with need to change her current regimen- recommendation given -  Stop pepto bismol.  Take 2 imodium with first diarrhea stool of the day- then 1 imodium post each loose stool for max of 8 day.  Prescription for [name] will be called in as well to use- this medication is to be taken twice a day.  And then use the imodium if needed.  Alescia verbalized understanding of the above and to call if neeeded.  Pharmacy verified and prescription sent.

## 2019-12-11 ENCOUNTER — Other Ambulatory Visit: Payer: Self-pay

## 2019-12-11 DIAGNOSIS — C50812 Malignant neoplasm of overlapping sites of left female breast: Secondary | ICD-10-CM

## 2019-12-11 DIAGNOSIS — Z171 Estrogen receptor negative status [ER-]: Secondary | ICD-10-CM

## 2019-12-11 NOTE — Progress Notes (Signed)
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 11/16/2019 CLINICAL DATA:  Port-A-Cath placement. EXAM: PORTABLE CHEST 1 VIEW COMPARISON:  CT chest 11/13/2019.  Chest x-ray 06/15/2016. FINDINGS: Port-A-Cath noted with tip over cavoatrial junction. Heart size normal. Lung volumes. Bibasilar pulmonary infiltrates/edema. Small left pleural effusion. No pneumothorax. IMPRESSION: Port-A-Cath noted with tip over superior vena cava. 2. Low lung volumes. Bibasilar pulmonary infiltrates/edema. Small left pleural effusion. Electronically Signed   By: Marcello Moores  Register   On: 11/16/2019 14:28   DG Fluoro Guide CV Line-No Report  Result Date: 11/16/2019 Fluoroscopy was utilized by the requesting physician.  No radiographic interpretation.  ASSESSMENT: 64 y.o.  [place] woman with  A: INVASIVE DUCTAL CARCINOMA LEFT BREAST (1)  status post left lumpectomy and sentinel lymph node dissection April of 2012 for a T1b N1(mic) stage IB invasive ductal carcinoma, grade 1, estrogen receptor 82% and progesterone receptor 92% positive, with no HER-2 amplification, and an MIB-1-1 of 17%,   (2)  The patient's Oncotype DX score of 21  predicted a 13% risk of distant recurrence after 5 years of tamoxifen.  (3)  status post radiation completed August of 2012,   (4)  on tamoxifen from September of 2012 to April 2014  (5) the plan had been to initiate anastrozole in April 2014, but the patient had a menstrual cycle in May 2014, and resumed tamoxifen.  (a) discontinued tamoxifen on her own initiative June 2015 because of "aches and pains".  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, grade 3, estrogen and progesterone receptor negative  (9) definitive surgery delayed (see discussion in 11/03/2019 note)  (10) genetics testing 06/14/2019 through the Multi-Gene Panel offered by [facility] found no deleterious mutations in AIP, ALK, APC, ATM, AXIN2,BAP1,  BARD1, BLM, BMPR1A, BRCA1, BRCA2, BRIP1, CASR, CDC73, CDH1, CDK4, CDKN1B, CDKN1C, CDKN2A (p14ARF), CDKN2A (p16INK4a), CEBPA, CHEK2, CTNNA1, DICER1, DIS3L2, EGFR (c.2369C>T, p.Thr790Met variant only), EPCAM (Deletion/duplication testing only), FH, FLCN, GATA2, GPC3, GREM1 (Promoter region deletion/duplication testing only), HOXB13 (c.251G>A, p.Gly84Glu), HRAS, KIT, MAX, MEN1, MET, MITF (c.952G>A, p.Glu318Lys variant only), MLH1, MSH2, MSH3, MSH6, MUTYH, NBN, NF1, NF2, NTHL1, PALB2, PDGFRA, PHOX2B, PMS2, POLD1, POLE, POT1, PRKAR1A, PTCH1, PTEN, RAD50, RAD51C, RAD51D, RB1, RECQL4, RET, RNF43, RUNX1, SDHAF2, SDHA (sequence changes only), SDHB, SDHC, SDHD, SMAD4, SMARCA4, SMARCB1, SMARCE1, STK11, SUFU, TERC, TERT, TMEM127, TP53, TSC1, TSC2, VHL, WRN and WT1.    C: METASTATIC BREAST CANCER: JAN  2021 (11) left axillary lymph node biopsy 10/30/2019 documents invasive mammary carcinoma, grade 3, estrogen receptor positive (80%, weak), progesterone receptor negative, HER-2 amplified (3+) MIB-170%  (a) breast MRI 11/23/2019 shows 3.6 cm non-mass-like enhancement in the left breast, with a second more clumped area measuring 4.8 cm, and at least 5 morphologically abnormal left axillary lymph node.  There is a left subpectoral lymph node and a left internal mammary lymph node noted as well.  (b) Chest CT W/C and bone scan 11/13/2019 show prevascular adenopathy (stage IV), no lung, liver or bone metastases; left breast mass and regional nodes  (c) PET 11/20/2019 shows prevascular node SUV of 22, bilateral paratracheal nodes with SUV 6-7  (12) neoadjuvant chemotherapy will consist of trastuzumab (Ogivri), Pertuzumab, carboplatin, docetaxel every 21 days x 6, starting 11/21/2019  (a) docetaxel changed to gemcitabine after the first dose because of neuropathy  (b) pertuzumab held with cycle 2 because of persistent diarrhea  (13) anti-HER-2 treatment to be continued indefinitely  (a) echo 11/09/2019 shows an ejection fraction in the 60-65% range   (14) surgery to follow as appropriate  (15) adjuvant radiation as appropriate   PLAN: Ranisha did moderately well with her first cycle of chemotherapy but she has already developed to significant issues.  One is very difficult to control diarrhea.  She has been on Imodium Questran and Pepto-Bismol with very poor control.  She  has had a couple of fainting episodes secondary to dehydration.  Accordingly we are going to hold the Pertuzumab at this point.  We will consider restarting it with cycle 3.  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  I think if we continued the docetaxel this would become a major issue so we are stopping the docetaxel.  We are switching to gemcitabine.  Those orders have been entered.  I am  having her return in 1 week just to make sure that she is tolerating everything well.  She will see me again in 3 weeks.  At that visit we will decide whether to resume Pertuzumab at a lower dose.  She brought [place] her advanced directives today and these are being separately scanned.  She will see [place] again next week and we will troubleshoot any problems then to make sure her subsequent treatments are easier.  Total encounter time 35 minutes.Chauncey Cruel, MD   12/12/2019 Oncology and Hematology [facility] [place] Tel. [phone]  Joylene Igo [phone]   I, Wilburn Mylar, am acting as scribe for Dr. [name]. Daryl Quiros.  I, Lurline Del MD, have reviewed the above documentation for accuracy and completeness, and I agree with the above.   *Total Encounter Time as defined by the Centers for Medicare and Medicaid Services includes, in addition to the face-to-face time of a patient visit (documented in the note above) non-face-to-face time: obtaining and reviewing outside history, ordering and reviewing medications, tests or procedures, care coordination (communications with other health care professionals or caregivers) and documentation in the medical record.

## 2019-12-12 ENCOUNTER — Inpatient Hospital Stay: Payer: 59

## 2019-12-12 ENCOUNTER — Inpatient Hospital Stay (HOSPITAL_BASED_OUTPATIENT_CLINIC_OR_DEPARTMENT_OTHER): Payer: 59 | Admitting: Oncology

## 2019-12-12 ENCOUNTER — Other Ambulatory Visit: Payer: Self-pay

## 2019-12-12 VITALS — BP 133/88 | HR 72 | Temp 97.8°F | Resp 18 | Ht 59.0 in | Wt 236.9 lb

## 2019-12-12 DIAGNOSIS — Z171 Estrogen receptor negative status [ER-]: Secondary | ICD-10-CM

## 2019-12-12 DIAGNOSIS — C50812 Malignant neoplasm of overlapping sites of left female breast: Secondary | ICD-10-CM

## 2019-12-12 DIAGNOSIS — Z5112 Encounter for antineoplastic immunotherapy: Secondary | ICD-10-CM | POA: Diagnosis not present

## 2019-12-12 DIAGNOSIS — C50112 Malignant neoplasm of central portion of left female breast: Secondary | ICD-10-CM

## 2019-12-12 DIAGNOSIS — C541 Malignant neoplasm of endometrium: Secondary | ICD-10-CM

## 2019-12-12 LAB — CBC WITH DIFFERENTIAL (CANCER CENTER ONLY)
Abs Immature Granulocytes: 0.02 10*3/uL (ref 0.00–0.07)
Basophils Absolute: 0 10*3/uL (ref 0.0–0.1)
Basophils Relative: 1 %
Eosinophils Absolute: 0 10*3/uL (ref 0.0–0.5)
Eosinophils Relative: 1 %
HCT: 34.1 % — ABNORMAL LOW (ref 36.0–46.0)
Hemoglobin: 10.8 g/dL — ABNORMAL LOW (ref 12.0–15.0)
Immature Granulocytes: 0 %
Lymphocytes Relative: 20 %
Lymphs Abs: 1.3 10*3/uL (ref 0.7–4.0)
MCH: 27.6 pg (ref 26.0–34.0)
MCHC: 31.7 g/dL (ref 30.0–36.0)
MCV: 87.2 fL (ref 80.0–100.0)
Monocytes Absolute: 0.7 10*3/uL (ref 0.1–1.0)
Monocytes Relative: 11 %
Neutro Abs: 4.5 10*3/uL (ref 1.7–7.7)
Neutrophils Relative %: 67 %
Platelet Count: 214 10*3/uL (ref 150–400)
RBC: 3.91 MIL/uL (ref 3.87–5.11)
RDW: 14.7 % (ref 11.5–15.5)
WBC Count: 6.6 10*3/uL (ref 4.0–10.5)
nRBC: 0 % (ref 0.0–0.2)

## 2019-12-12 LAB — CMP (CANCER CENTER ONLY)
ALT: 24 U/L (ref 0–44)
AST: 15 U/L (ref 15–41)
Albumin: 3.2 g/dL — ABNORMAL LOW (ref 3.5–5.0)
Alkaline Phosphatase: 62 U/L (ref 38–126)
Anion gap: 7 (ref 5–15)
BUN: 14 mg/dL (ref 8–23)
CO2: 25 mmol/L (ref 22–32)
Calcium: 9 mg/dL (ref 8.9–10.3)
Chloride: 106 mmol/L (ref 98–111)
Creatinine: 0.84 mg/dL (ref 0.44–1.00)
GFR, Est AFR Am: >60 mL/min (ref >60)
GFR, Estimated: >60 mL/min (ref >60)
Glucose, Bld: 172 mg/dL — ABNORMAL HIGH (ref 70–99)
Potassium: 3.3 mmol/L — ABNORMAL LOW (ref 3.5–5.1)
Sodium: 138 mmol/L (ref 135–145)
Total Bilirubin: 0.3 mg/dL (ref 0.3–1.2)
Total Protein: 7.1 g/dL (ref 6.5–8.1)

## 2019-12-12 MED ORDER — SODIUM CHLORIDE 0.9 % IV SOLN
746.0000 mg | Freq: Once | INTRAVENOUS | Status: AC
  Administered 2019-12-12: 750 mg via INTRAVENOUS
  Filled 2019-12-12: qty 75

## 2019-12-12 MED ORDER — DIPHENHYDRAMINE HCL 25 MG PO CAPS
25.0000 mg | ORAL_CAPSULE | Freq: Once | ORAL | Status: AC
  Administered 2019-12-12: 25 mg via ORAL

## 2019-12-12 MED ORDER — HEPARIN SOD (PORK) LOCK FLUSH 100 UNIT/ML IV SOLN
500.0000 [IU] | Freq: Once | INTRAVENOUS | Status: AC | PRN
  Administered 2019-12-12: 500 [IU]
  Filled 2019-12-12: qty 5

## 2019-12-12 MED ORDER — DEXAMETHASONE SODIUM PHOSPHATE 10 MG/ML IJ SOLN
INTRAMUSCULAR | Status: AC
  Filled 2019-12-12: qty 1

## 2019-12-12 MED ORDER — DIPHENHYDRAMINE HCL 25 MG PO CAPS
ORAL_CAPSULE | ORAL | Status: AC
  Filled 2019-12-12: qty 1

## 2019-12-12 MED ORDER — SODIUM CHLORIDE 0.9% FLUSH
10.0000 mL | INTRAVENOUS | Status: DC | PRN
  Administered 2019-12-12: 10 mL
  Filled 2019-12-12: qty 10

## 2019-12-12 MED ORDER — ACETAMINOPHEN 325 MG PO TABS
650.0000 mg | ORAL_TABLET | Freq: Once | ORAL | Status: AC
  Administered 2019-12-12: 10:00:00 650 mg via ORAL

## 2019-12-12 MED ORDER — ACETAMINOPHEN 325 MG PO TABS
ORAL_TABLET | ORAL | Status: AC
  Filled 2019-12-12: qty 2

## 2019-12-12 MED ORDER — DEXAMETHASONE SODIUM PHOSPHATE 10 MG/ML IJ SOLN
10.0000 mg | Freq: Once | INTRAMUSCULAR | Status: AC
  Administered 2019-12-12: 10:00:00 10 mg via INTRAVENOUS

## 2019-12-12 MED ORDER — SODIUM CHLORIDE 0.9 % IV SOLN
800.0000 mg/m2 | Freq: Once | INTRAVENOUS | Status: AC
  Administered 2019-12-12: 11:00:00 1710 mg via INTRAVENOUS
  Filled 2019-12-12: qty 44.97

## 2019-12-12 MED ORDER — PALONOSETRON HCL INJECTION 0.25 MG/5ML
INTRAVENOUS | Status: AC
  Filled 2019-12-12: qty 5

## 2019-12-12 MED ORDER — SODIUM CHLORIDE 0.9 % IV SOLN
Freq: Once | INTRAVENOUS | Status: AC
  Filled 2019-12-12: qty 250

## 2019-12-12 MED ORDER — PALONOSETRON HCL INJECTION 0.25 MG/5ML
0.2500 mg | Freq: Once | INTRAVENOUS | Status: AC
  Administered 2019-12-12: 0.25 mg via INTRAVENOUS

## 2019-12-12 MED ORDER — TRASTUZUMAB-DKST CHEMO 150 MG IV SOLR
6.0000 mg/kg | Freq: Once | INTRAVENOUS | Status: AC
  Administered 2019-12-12: 672 mg via INTRAVENOUS
  Filled 2019-12-12: qty 32

## 2019-12-12 MED ORDER — SODIUM CHLORIDE 0.9 % IV SOLN
150.0000 mg | Freq: Once | INTRAVENOUS | Status: AC
  Administered 2019-12-12: 150 mg via INTRAVENOUS
  Filled 2019-12-12: qty 150

## 2019-12-12 NOTE — Patient Instructions (Signed)

## 2019-12-12 NOTE — Patient Instructions (Signed)
[facility] Discharge Instructions for Patients Receiving Chemotherapy  Today you received the following chemotherapy agents Trastuzumab, Gemzar and Carboplatin   To help prevent nausea and vomiting after your treatment, we encourage you to take your nausea medication as directed.    If you develop nausea and vomiting that is not controlled by your nausea medication, call the clinic.   BELOW ARE SYMPTOMS THAT SHOULD BE REPORTED IMMEDIATELY:  *FEVER GREATER THAN 100.5 F  *CHILLS WITH OR WITHOUT FEVER  NAUSEA AND VOMITING THAT IS NOT CONTROLLED WITH YOUR NAUSEA MEDICATION  *UNUSUAL SHORTNESS OF BREATH  *UNUSUAL BRUISING OR BLEEDING  TENDERNESS IN MOUTH AND THROAT WITH OR WITHOUT PRESENCE OF ULCERS  *URINARY PROBLEMS  *BOWEL PROBLEMS  UNUSUAL RASH Items with * indicate a potential emergency and should be followed up as soon as possible.  Feel free to call the clinic should you have any questions or concerns. The clinic phone number is [phone].  Please show the CHEMO ALERT CARD at check-in to the Emergency Department and triage nurse.  Gemcitabine injection What is this medicine? GEMCITABINE (jem SYE ta been) is a chemotherapy drug. This medicine is used to treat many types of cancer like breast cancer, lung cancer, pancreatic cancer, and ovarian cancer. This medicine may be used for other purposes; ask your health care provider or pharmacist if you have questions. COMMON BRAND NAME(S): Gemzar, Infugem What should I tell my health care provider before I take this medicine? They need to know if you have any of these conditions:  blood disorders  infection  kidney disease  liver disease  lung or breathing disease, like asthma  recent or ongoing radiation therapy  an unusual or allergic reaction to gemcitabine, other chemotherapy, other medicines, foods, dyes, or preservatives  pregnant or trying to get pregnant  breast-feeding How should I use  this medicine? This drug is given as an infusion into a vein. It is administered in a hospital or clinic by a specially trained health care professional. Talk to your pediatrician regarding the use of this medicine in children. Special care may be needed. Overdosage: If you think you have taken too much of this medicine contact a poison control center or emergency room at once. NOTE: This medicine is only for you. Do not share this medicine with others. What if I miss a dose? It is important not to miss your dose. Call your doctor or health care professional if you are unable to keep an appointment. What may interact with this medicine?  medicines to increase blood counts like filgrastim, pegfilgrastim, sargramostim  some other chemotherapy drugs like cisplatin  vaccines Talk to your doctor or health care professional before taking any of these medicines:  acetaminophen  aspirin  ibuprofen  ketoprofen  naproxen This list may not describe all possible interactions. Give your health care provider a list of all the medicines, herbs, non-prescription drugs, or dietary supplements you use. Also tell them if you smoke, drink alcohol, or use illegal drugs. Some items may interact with your medicine. What should I watch for while using this medicine? Visit your doctor for checks on your progress. This drug may make you feel generally unwell. This is not uncommon, as chemotherapy can affect healthy cells as well as cancer cells. Report any side effects. Continue your course of treatment even though you feel ill unless your doctor tells you to stop. In some cases, you may be given additional medicines to help with side effects. Follow all   directions for their use. Call your doctor or health care professional for advice if you get a fever, chills or sore throat, or other symptoms of a cold or flu. Do not treat yourself. This drug decreases your body's ability to fight infections. Try to avoid being  around people who are sick. This medicine may increase your risk to bruise or bleed. Call your doctor or health care professional if you notice any unusual bleeding. Be careful brushing and flossing your teeth or using a toothpick because you may get an infection or bleed more easily. If you have any dental work done, tell your dentist you are receiving this medicine. Avoid taking products that contain aspirin, acetaminophen, ibuprofen, naproxen, or ketoprofen unless instructed by your doctor. These medicines may hide a fever. Do not become pregnant while taking this medicine or for 6 months after stopping it. Women should inform their doctor if they wish to become pregnant or think they might be pregnant. Men should not father a child while taking this medicine and for 3 months after stopping it. There is a potential for serious side effects to an unborn child. Talk to your health care professional or pharmacist for more information. Do not breast-feed an infant while taking this medicine or for at least 1 week after stopping it. Men should inform their doctors if they wish to father a child. This medicine may lower sperm counts. Talk with your doctor or health care professional if you are concerned about your fertility. What side effects may I notice from receiving this medicine? Side effects that you should report to your doctor or health care professional as soon as possible:  allergic reactions like skin rash, itching or hives, swelling of the face, lips, or tongue  breathing problems  pain, redness, or irritation at site where injected  signs and symptoms of a dangerous change in heartbeat or heart rhythm like chest pain; dizziness; fast or irregular heartbeat; palpitations; feeling faint or lightheaded, falls; breathing problems  signs of decreased platelets or bleeding - bruising, pinpoint red spots on the skin, black, tarry stools, blood in the urine  signs of decreased red blood cells -  unusually weak or tired, feeling faint or lightheaded, falls  signs of infection - fever or chills, cough, sore throat, pain or difficulty passing urine  signs and symptoms of kidney injury like trouble passing urine or change in the amount of urine  signs and symptoms of liver injury like dark yellow or brown urine; general ill feeling or flu-like symptoms; light-colored stools; loss of appetite; nausea; right upper belly pain; unusually weak or tired; yellowing of the eyes or skin  swelling of ankles, feet, hands Side effects that usually do not require medical attention (report to your doctor or health care professional if they continue or are bothersome):  constipation  diarrhea  hair loss  loss of appetite  nausea  rash  vomiting This list may not describe all possible side effects. Call your doctor for medical advice about side effects. You may report side effects to FDA at 1-800-FDA-1088. Where should I keep my medicine? This drug is given in a hospital or clinic and will not be stored at home. NOTE: This sheet is a summary. It may not cover all possible information. If you have questions about this medicine, talk to your doctor, pharmacist, or health care provider.  2020 Elsevier/Gold Standard (2017-12-29 18:06:11)  

## 2019-12-12 NOTE — Addendum Note (Signed)
Addended by: Chauncey Cruel on: 12/12/2019 10:03 AM   Modules accepted: Orders

## 2019-12-13 ENCOUNTER — Telehealth: Payer: Self-pay | Admitting: Adult Health

## 2019-12-13 NOTE — Telephone Encounter (Signed)
I talk with patient regarding 3/2

## 2019-12-14 ENCOUNTER — Other Ambulatory Visit: Payer: Self-pay

## 2019-12-14 ENCOUNTER — Inpatient Hospital Stay: Payer: 59

## 2019-12-14 VITALS — BP 127/72 | HR 60 | Temp 98.3°F | Resp 18

## 2019-12-14 DIAGNOSIS — Z171 Estrogen receptor negative status [ER-]: Secondary | ICD-10-CM

## 2019-12-14 DIAGNOSIS — Z5112 Encounter for antineoplastic immunotherapy: Secondary | ICD-10-CM | POA: Diagnosis not present

## 2019-12-14 DIAGNOSIS — C50812 Malignant neoplasm of overlapping sites of left female breast: Secondary | ICD-10-CM

## 2019-12-14 DIAGNOSIS — C50112 Malignant neoplasm of central portion of left female breast: Secondary | ICD-10-CM

## 2019-12-14 MED ORDER — PEGFILGRASTIM INJECTION 6 MG/0.6ML ~~LOC~~
PREFILLED_SYRINGE | SUBCUTANEOUS | Status: AC
  Filled 2019-12-14: qty 0.6

## 2019-12-14 MED ORDER — PEGFILGRASTIM INJECTION 6 MG/0.6ML ~~LOC~~
6.0000 mg | PREFILLED_SYRINGE | Freq: Once | SUBCUTANEOUS | Status: AC
  Administered 2019-12-14: 6 mg via SUBCUTANEOUS

## 2019-12-14 NOTE — Progress Notes (Signed)
12/14/19  Patient insurance requires [employer] for trastuzumab product.  Treatment plan updated to this brand.  Henreitta Leber, PharmD

## 2019-12-14 NOTE — Patient Instructions (Signed)

## 2019-12-15 ENCOUNTER — Other Ambulatory Visit: Payer: Self-pay | Admitting: Oncology

## 2019-12-18 ENCOUNTER — Other Ambulatory Visit: Payer: Self-pay

## 2019-12-18 ENCOUNTER — Other Ambulatory Visit: Payer: Self-pay | Admitting: Oncology

## 2019-12-18 ENCOUNTER — Telehealth: Payer: Self-pay

## 2019-12-18 DIAGNOSIS — Z171 Estrogen receptor negative status [ER-]: Secondary | ICD-10-CM

## 2019-12-18 DIAGNOSIS — C50812 Malignant neoplasm of overlapping sites of left female breast: Secondary | ICD-10-CM

## 2019-12-18 NOTE — Telephone Encounter (Signed)
Pt called to report continuation with chronic diarrhea.    Pt with history of diarrhea related to chemotherapy/immunotherapy.  Pt is currently taking questran as directed with no relief.  Pt reports she feels like [name] is making diarrhea worse.  Pt continues with 7-8 loose stools daily.    Pt reports weakness, but is able to walk without assistance.  Pt is tolerating fluids at home, pt reports drinking several bottles of water daily along with Gatorade.  Pt states, "I feel weaker than normal, but am able to safely get around."  Pt scheduled with provider on 3/2, along with IVF's.    Per MD recommendations okay for patient to hold [place] today until evaluation on 3/2.  RN encouraged continuation of fluids and patient will keep follow up appointment on 3/2.  RN educated if any changes in mobility or toleration of fluids to notify clinic.

## 2019-12-19 ENCOUNTER — Inpatient Hospital Stay: Payer: 59 | Admitting: Adult Health

## 2019-12-19 ENCOUNTER — Inpatient Hospital Stay: Payer: 59

## 2019-12-19 ENCOUNTER — Other Ambulatory Visit: Payer: 59

## 2019-12-19 ENCOUNTER — Ambulatory Visit: Payer: 59

## 2019-12-19 ENCOUNTER — Ambulatory Visit: Payer: 59 | Admitting: Adult Health

## 2019-12-19 ENCOUNTER — Ambulatory Visit (HOSPITAL_COMMUNITY)
Admission: RE | Admit: 2019-12-19 | Discharge: 2019-12-19 | Disposition: A | Discharge status: Home / Self Care | Payer: 59 | Source: Ambulatory Visit | Attending: Oncology | Admitting: Oncology

## 2019-12-19 ENCOUNTER — Inpatient Hospital Stay: Payer: 59 | Attending: Oncology

## 2019-12-19 ENCOUNTER — Other Ambulatory Visit: Payer: Self-pay

## 2019-12-19 ENCOUNTER — Encounter: Payer: Self-pay | Admitting: Adult Health

## 2019-12-19 VITALS — BP 119/62 | HR 92 | Temp 97.8°F | Resp 18 | Ht 59.0 in | Wt 230.4 lb

## 2019-12-19 DIAGNOSIS — C541 Malignant neoplasm of endometrium: Secondary | ICD-10-CM | POA: Diagnosis present

## 2019-12-19 DIAGNOSIS — Z170 Estrogen receptor positive status [ER+]: Secondary | ICD-10-CM | POA: Diagnosis not present

## 2019-12-19 DIAGNOSIS — Z803 Family history of malignant neoplasm of breast: Secondary | ICD-10-CM | POA: Diagnosis not present

## 2019-12-19 DIAGNOSIS — Z8249 Family history of ischemic heart disease and other diseases of the circulatory system: Secondary | ICD-10-CM | POA: Insufficient documentation

## 2019-12-19 DIAGNOSIS — Z90710 Acquired absence of both cervix and uterus: Secondary | ICD-10-CM | POA: Diagnosis not present

## 2019-12-19 DIAGNOSIS — C773 Secondary and unspecified malignant neoplasm of axilla and upper limb lymph nodes: Secondary | ICD-10-CM | POA: Insufficient documentation

## 2019-12-19 DIAGNOSIS — C50912 Malignant neoplasm of unspecified site of left female breast: Secondary | ICD-10-CM | POA: Insufficient documentation

## 2019-12-19 DIAGNOSIS — R197 Diarrhea, unspecified: Secondary | ICD-10-CM | POA: Insufficient documentation

## 2019-12-19 DIAGNOSIS — Z79899 Other long term (current) drug therapy: Secondary | ICD-10-CM | POA: Diagnosis not present

## 2019-12-19 DIAGNOSIS — Z5111 Encounter for antineoplastic chemotherapy: Secondary | ICD-10-CM | POA: Insufficient documentation

## 2019-12-19 DIAGNOSIS — C50112 Malignant neoplasm of central portion of left female breast: Secondary | ICD-10-CM

## 2019-12-19 DIAGNOSIS — R110 Nausea: Secondary | ICD-10-CM | POA: Insufficient documentation

## 2019-12-19 DIAGNOSIS — C50812 Malignant neoplasm of overlapping sites of left female breast: Secondary | ICD-10-CM | POA: Diagnosis not present

## 2019-12-19 DIAGNOSIS — Z8349 Family history of other endocrine, nutritional and metabolic diseases: Secondary | ICD-10-CM | POA: Diagnosis not present

## 2019-12-19 DIAGNOSIS — Z833 Family history of diabetes mellitus: Secondary | ICD-10-CM | POA: Diagnosis not present

## 2019-12-19 DIAGNOSIS — Z171 Estrogen receptor negative status [ER-]: Secondary | ICD-10-CM

## 2019-12-19 DIAGNOSIS — Z90722 Acquired absence of ovaries, bilateral: Secondary | ICD-10-CM | POA: Insufficient documentation

## 2019-12-19 DIAGNOSIS — Z7952 Long term (current) use of systemic steroids: Secondary | ICD-10-CM | POA: Insufficient documentation

## 2019-12-19 DIAGNOSIS — Z9221 Personal history of antineoplastic chemotherapy: Secondary | ICD-10-CM | POA: Diagnosis not present

## 2019-12-19 DIAGNOSIS — G620 Drug-induced polyneuropathy: Secondary | ICD-10-CM | POA: Insufficient documentation

## 2019-12-19 DIAGNOSIS — E119 Type 2 diabetes mellitus without complications: Secondary | ICD-10-CM | POA: Insufficient documentation

## 2019-12-19 DIAGNOSIS — Z801 Family history of malignant neoplasm of trachea, bronchus and lung: Secondary | ICD-10-CM | POA: Insufficient documentation

## 2019-12-19 DIAGNOSIS — Z8542 Personal history of malignant neoplasm of other parts of uterus: Secondary | ICD-10-CM | POA: Insufficient documentation

## 2019-12-19 DIAGNOSIS — Z791 Long term (current) use of non-steroidal anti-inflammatories (NSAID): Secondary | ICD-10-CM | POA: Insufficient documentation

## 2019-12-19 DIAGNOSIS — Z923 Personal history of irradiation: Secondary | ICD-10-CM | POA: Insufficient documentation

## 2019-12-19 DIAGNOSIS — Z5189 Encounter for other specified aftercare: Secondary | ICD-10-CM | POA: Diagnosis not present

## 2019-12-19 DIAGNOSIS — Z5112 Encounter for antineoplastic immunotherapy: Secondary | ICD-10-CM | POA: Diagnosis not present

## 2019-12-19 DIAGNOSIS — Z95828 Presence of other vascular implants and grafts: Secondary | ICD-10-CM

## 2019-12-19 LAB — CBC WITH DIFFERENTIAL (CANCER CENTER ONLY)
Abs Immature Granulocytes: 0.2 10*3/uL — ABNORMAL HIGH (ref 0.00–0.07)
Basophils Absolute: 0.1 10*3/uL (ref 0.0–0.1)
Basophils Relative: 1 %
Eosinophils Absolute: 0.1 10*3/uL (ref 0.0–0.5)
Eosinophils Relative: 0 %
HCT: 35.7 % — ABNORMAL LOW (ref 36.0–46.0)
Hemoglobin: 11.5 g/dL — ABNORMAL LOW (ref 12.0–15.0)
Immature Granulocytes: 1 %
Lymphocytes Relative: 8 %
Lymphs Abs: 1.5 10*3/uL (ref 0.7–4.0)
MCH: 27.9 pg (ref 26.0–34.0)
MCHC: 32.2 g/dL (ref 30.0–36.0)
MCV: 86.7 fL (ref 80.0–100.0)
Monocytes Absolute: 1.8 10*3/uL — ABNORMAL HIGH (ref 0.1–1.0)
Monocytes Relative: 10 %
Neutro Abs: 14.5 10*3/uL — ABNORMAL HIGH (ref 1.7–7.7)
Neutrophils Relative %: 80 %
Platelet Count: 131 10*3/uL — ABNORMAL LOW (ref 150–400)
RBC: 4.12 MIL/uL (ref 3.87–5.11)
RDW: 14.6 % (ref 11.5–15.5)
WBC Count: 18.1 10*3/uL — ABNORMAL HIGH (ref 4.0–10.5)
nRBC: 0 % (ref 0.0–0.2)

## 2019-12-19 LAB — CMP (CANCER CENTER ONLY)
ALT: 34 U/L (ref 0–44)
AST: 20 U/L (ref 15–41)
Albumin: 3.4 g/dL — ABNORMAL LOW (ref 3.5–5.0)
Alkaline Phosphatase: 138 U/L — ABNORMAL HIGH (ref 38–126)
Anion gap: 10 (ref 5–15)
BUN: 15 mg/dL (ref 8–23)
CO2: 24 mmol/L (ref 22–32)
Calcium: 9.1 mg/dL (ref 8.9–10.3)
Chloride: 102 mmol/L (ref 98–111)
Creatinine: 0.91 mg/dL (ref 0.44–1.00)
GFR, Est AFR Am: >60 mL/min (ref >60)
GFR, Estimated: >60 mL/min (ref >60)
Glucose, Bld: 130 mg/dL — ABNORMAL HIGH (ref 70–99)
Potassium: 3.1 mmol/L — ABNORMAL LOW (ref 3.5–5.1)
Sodium: 136 mmol/L (ref 135–145)
Total Bilirubin: 0.2 mg/dL — ABNORMAL LOW (ref 0.3–1.2)
Total Protein: 7.7 g/dL (ref 6.5–8.1)

## 2019-12-19 MED ORDER — PROCHLORPERAZINE EDISYLATE 10 MG/2ML IJ SOLN
10.0000 mg | Freq: Once | INTRAMUSCULAR | Status: AC
  Administered 2019-12-19: 10 mg via INTRAVENOUS

## 2019-12-19 MED ORDER — SODIUM CHLORIDE 0.9 % IV SOLN
Freq: Once | INTRAVENOUS | Status: AC
  Filled 2019-12-19: qty 250

## 2019-12-19 MED ORDER — PROCHLORPERAZINE EDISYLATE 10 MG/2ML IJ SOLN
INTRAMUSCULAR | Status: AC
  Filled 2019-12-19: qty 2

## 2019-12-19 MED ORDER — PROCHLORPERAZINE EDISYLATE 10 MG/2ML IJ SOLN: 10.0000 mg | Freq: Once | INTRAMUSCULAR | Status: DC

## 2019-12-19 MED ORDER — GADOBUTROL 1 MMOL/ML IV SOLN
10.0000 mL | Freq: Once | INTRAVENOUS | Status: AC | PRN
  Administered 2019-12-19: 10 mL via INTRAVENOUS

## 2019-12-19 MED ORDER — SODIUM CHLORIDE 0.9% FLUSH
10.0000 mL | Freq: Once | INTRAVENOUS | Status: AC
  Administered 2019-12-19: 10 mL
  Filled 2019-12-19: qty 10

## 2019-12-19 MED ORDER — SODIUM CHLORIDE 0.9 % IV SOLN
Freq: Once | INTRAVENOUS | Status: DC
  Filled 2019-12-19: qty 250

## 2019-12-19 MED ORDER — HEPARIN SOD (PORK) LOCK FLUSH 100 UNIT/ML IV SOLN
500.0000 [IU] | Freq: Once | INTRAVENOUS | Status: AC
  Administered 2019-12-19: 500 [IU]
  Filled 2019-12-19: qty 5

## 2019-12-19 NOTE — Progress Notes (Addendum)
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 TERC, TERT, TMEM127, TP53, TSC1, TSC2, VHL, WRN and WT1.    C: METASTATIC BREAST CANCER: JAN 2021 (11) left axillary lymph node biopsy 10/30/2019 documents invasive mammary carcinoma, grade 3, estrogen receptor positive (80%, weak), progesterone receptor negative, HER-2 amplified (3+) MIB-170%  (a) breast MRI 11/23/2019 shows 3.6 cm non-mass-like enhancement in the left breast, with a second more clumped area measuring 4.8 cm, and at least 5 morphologically abnormal left axillary lymph  node.  There is a left subpectoral lymph node and a left internal mammary lymph node noted as well.  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  She has continued to have a difficult time with treatment due to the diarrhea that she has experienced.  She had stopped taking imodium because she ran out.  She met with Dr. Jana Hakim who reviewed that she should receive IV fluids daily until her diarrhea is resolved.    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  He noted that she will not be receiving chemotherapy with Docetaxel and instead Gemcitabine.  He is working with getting it approved through insurance.  He also reviewed that with her first loose bowel movement, she should take two Loperamide tablets.  With each subsequent loose bowel movement, she was recommended to take an additional loperamide tablet, to a max of 8 tablets per 24 hours.  He wrote this down and gave to her.  I gave her some lotion for the dry skin on her hands and also I gave her some information about dietary modifications that can help with diarrhea.  She will also receive some IV Prochlorperazine today due to her nausea.    We will see [name] back in 2 weeks for labs, f/u, and her next treatment.  She was recommended to continue with the appropriate pandemic precautions. She knows to call for any questions that may arise between now and her next appointment.  We are happy to see her sooner if needed.  Total encounter time: 30 minutes   Wilber Bihari, NP 12/19/19 12:35 PM Medical Oncology and Hematology [facility] [place], [place] [address] Tel. [phone]    Fax. [phone]  *Total Encounter Time as defined by the Centers for Medicare and Medicaid Services includes, in addition to the face-to-face time of a patient visit  (documented in the note above) non-face-to-face time: obtaining and reviewing outside history, ordering and reviewing medications, tests or procedures, care coordination (communications with other health care professionals or caregivers) and documentation in the medical record.   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  I do not think we can continue taxanes without causing her significant disability.  Accordingly we are stopping the Taxotere and switching her to Gemzar.  Specifically she will receive 800 mg/m on day 1 only.  This is our standard of care in cases like this.  I discussed this with her insurance MD.  His suggestion was that we continue taxanes until the patient has more severe neuropathy.  I do not think that is an adequate plan.  In fact there is no standard of care at  this point.  We could certainly continue Taxotere and cause the patient more disability, we could simply stop the Taxotere and compromise her response to treatment, or we could do what we do which is a compromise solution.  There is no randomized category 1 data for any of these options.  However in this situation which we have encountered many times before we have many cases that have but achieved a complete pathologic response  Francie understands that we will do everything we can to not add financial toxicity to her treatment.  I personally saw this patient and performed a substantive portion of this encounter with the listed APP documented above.   Chauncey Cruel, MD Medical Oncology and Hematology [facility] [address] [place], [place] [address] Tel. [phone]    Fax. [phone]

## 2019-12-19 NOTE — Progress Notes (Signed)
Nutrition Assessment   Reason for Assessment:   Referral from RN, Kathlee Nations    ASSESSMENT:  63 year old female with metastatic breast cancer.  Past medical history of DM, IBS, HLD.  Patient receiving chemotherapy.  Spoke with patient via phone.  Patient reports diarrhea and nausea.  Reports that NP gave her a handout on foods to avoid with diarrhea.    Medications: reviewed   Labs: reviewed   Anthropometrics:   Height: 4'11" Weight: 230 lb 6.4 oz Noted 255 lb on 10/30/19 BMI: 47  9% weight loss in the last 2 months, significant   Estimated Energy Needs  Kcals: 2288-2600 Protein: 114-130 g Fluid: 2.2 L   NUTRITION DIAGNOSIS: Unintentional weight loss related to cancer related treatment side effects as evidenced by 9% weight loss in 2 months   INTERVENTION:  Answered patient's questions regarding nutrition in general during treatment.   Patient wants to review information that was given to her today.  RD will plan to meet with patient on 3/16 during infusion for further support.  Patient has contact information   MONITORING, EVALUATION, GOAL: Patient will consume adequate calories and protein to maintain lean muscle mass during treatment   Next Visit: March 16 during infusion  Benjerman Molinelli B. Zenia Resides, [place], [place] Registered Dietitian [phone] (pager)

## 2019-12-19 NOTE — Patient Instructions (Signed)

## 2019-12-20 ENCOUNTER — Inpatient Hospital Stay: Payer: 59

## 2019-12-20 ENCOUNTER — Encounter: Payer: Self-pay | Admitting: Oncology

## 2019-12-20 ENCOUNTER — Other Ambulatory Visit: Payer: Self-pay

## 2019-12-20 VITALS — BP 119/76 | HR 80 | Temp 98.5°F | Resp 16

## 2019-12-20 DIAGNOSIS — C50812 Malignant neoplasm of overlapping sites of left female breast: Secondary | ICD-10-CM

## 2019-12-20 DIAGNOSIS — Z5112 Encounter for antineoplastic immunotherapy: Secondary | ICD-10-CM | POA: Diagnosis not present

## 2019-12-20 DIAGNOSIS — Z95828 Presence of other vascular implants and grafts: Secondary | ICD-10-CM

## 2019-12-20 DIAGNOSIS — Z171 Estrogen receptor negative status [ER-]: Secondary | ICD-10-CM

## 2019-12-20 MED ORDER — ACETAMINOPHEN 325 MG PO TABS
ORAL_TABLET | ORAL | Status: AC
  Filled 2019-12-20: qty 2

## 2019-12-20 MED ORDER — HEPARIN SOD (PORK) LOCK FLUSH 100 UNIT/ML IV SOLN
500.0000 [IU] | Freq: Once | INTRAVENOUS | Status: AC
  Administered 2019-12-20: 500 [IU]
  Filled 2019-12-20: qty 5

## 2019-12-20 MED ORDER — ACETAMINOPHEN 325 MG PO TABS
650.0000 mg | ORAL_TABLET | Freq: Once | ORAL | Status: AC
  Administered 2019-12-20: 650 mg via ORAL

## 2019-12-20 MED ORDER — SODIUM CHLORIDE 0.9 % IV SOLN
Freq: Once | INTRAVENOUS | Status: AC
  Filled 2019-12-20: qty 250

## 2019-12-20 MED ORDER — SODIUM CHLORIDE 0.9% FLUSH
10.0000 mL | Freq: Once | INTRAVENOUS | Status: AC
  Administered 2019-12-20: 10 mL
  Filled 2019-12-20: qty 10

## 2019-12-20 NOTE — Patient Instructions (Signed)

## 2019-12-21 ENCOUNTER — Other Ambulatory Visit: Payer: Self-pay | Admitting: Medical

## 2019-12-21 ENCOUNTER — Telehealth: Payer: Self-pay | Admitting: Hematology

## 2019-12-21 ENCOUNTER — Inpatient Hospital Stay: Payer: 59

## 2019-12-21 ENCOUNTER — Other Ambulatory Visit (HOSPITAL_COMMUNITY): Payer: Self-pay | Admitting: Emergency Medicine

## 2019-12-21 ENCOUNTER — Other Ambulatory Visit: Payer: Self-pay | Admitting: Hematology

## 2019-12-21 ENCOUNTER — Inpatient Hospital Stay (HOSPITAL_BASED_OUTPATIENT_CLINIC_OR_DEPARTMENT_OTHER): Payer: 59 | Admitting: Medical

## 2019-12-21 ENCOUNTER — Ambulatory Visit: Payer: 59

## 2019-12-21 ENCOUNTER — Other Ambulatory Visit: Payer: Self-pay

## 2019-12-21 DIAGNOSIS — Z171 Estrogen receptor negative status [ER-]: Secondary | ICD-10-CM

## 2019-12-21 DIAGNOSIS — A0472 Enterocolitis due to Clostridium difficile, not specified as recurrent: Secondary | ICD-10-CM

## 2019-12-21 DIAGNOSIS — R110 Nausea: Secondary | ICD-10-CM

## 2019-12-21 DIAGNOSIS — Z5112 Encounter for antineoplastic immunotherapy: Secondary | ICD-10-CM | POA: Diagnosis not present

## 2019-12-21 DIAGNOSIS — Z95828 Presence of other vascular implants and grafts: Secondary | ICD-10-CM

## 2019-12-21 DIAGNOSIS — R197 Diarrhea, unspecified: Secondary | ICD-10-CM

## 2019-12-21 DIAGNOSIS — C50812 Malignant neoplasm of overlapping sites of left female breast: Secondary | ICD-10-CM

## 2019-12-21 LAB — C DIFFICILE QUICK SCREEN W PCR REFLEX
C Diff antigen: POSITIVE — AB
C Diff interpretation: DETECTED
C Diff toxin: POSITIVE — AB

## 2019-12-21 MED ORDER — SODIUM CHLORIDE 0.9% FLUSH
10.0000 mL | Freq: Once | INTRAVENOUS | Status: AC
  Administered 2019-12-21: 10 mL
  Filled 2019-12-21: qty 10

## 2019-12-21 MED ORDER — ONDANSETRON HCL 4 MG/2ML IJ SOLN
INTRAMUSCULAR | Status: AC
  Filled 2019-12-21: qty 4

## 2019-12-21 MED ORDER — ONDANSETRON HCL 4 MG/2ML IJ SOLN
8.0000 mg | Freq: Once | INTRAMUSCULAR | Status: AC
  Administered 2019-12-21: 8 mg via INTRAVENOUS

## 2019-12-21 MED ORDER — METRONIDAZOLE 500 MG PO TABS: 500.0000 mg | ORAL_TABLET | Freq: Three times a day (TID) | ORAL | 0 refills | Status: DC

## 2019-12-21 MED ORDER — HEPARIN SOD (PORK) LOCK FLUSH 100 UNIT/ML IV SOLN
500.0000 [IU] | Freq: Once | INTRAVENOUS | Status: AC
  Administered 2019-12-21: 500 [IU]
  Filled 2019-12-21: qty 5

## 2019-12-21 MED ORDER — SODIUM CHLORIDE 0.9 % IV SOLN
Freq: Once | INTRAVENOUS | Status: AC
  Filled 2019-12-21: qty 250

## 2019-12-21 MED ORDER — DIPHENOXYLATE-ATROPINE 2.5-0.025 MG PO TABS: ORAL_TABLET | ORAL | 2 refills | Status: DC

## 2019-12-21 NOTE — Patient Instructions (Signed)

## 2019-12-21 NOTE — Progress Notes (Signed)
The patient was seen in the infusion room today.  She reports ongoing nausea and diarrhea.  She has spoken to Dr. Jana Hakim and to Wilber Bihari about this issue.  She is taking Imodium 8 tablets a day and Questran.  Despite this she continues to have diarrhea.  She states that her bowel movements are watery at times and are formed at other times.  She is having approximately 8 bowel movements a day.  She is agreeable to have a stool sample collected today for a C. difficile study.  She has been given a collection container.  Sandi Mealy, MHS, PA-C Physician Assistant

## 2019-12-21 NOTE — Telephone Encounter (Signed)
I received a phone call from the answering service that the patient's C. Diff was positive. I reviewed the patient's chart, and C. Diff was ordered on 12/21/2019 due to recurrent diarrhea.  I contacted the patient and informed her of the result around 21:00.  Patient had several questions about C. Diff, including how she got the infection, how long she would need abx, and how she can avoid having recurrent C. Diff. I informed the patient that the abx duration depends on her symptom improvement, and she should discuss her questions with her treatment team in the morning.  She also stated that she had been having diarrhea for several weeks, and asked why it had not been done sooner, to which I replied that I was not familiar with her treatment history and would not be able to comment on why/when the test should have been done.  I recommended Flagyl 500mg  TID, starting tonight, but the patient stated that she was already getting ready for bed, and wished to start her medication in the morning. Due to the risk of toxic megacolon with c. Diff, I advised her to avoid Imodium and Lomotil for now, and maintain adequate hydration.  If she develops worsening diarrhea, hematochezia, abdominal pain, or other progressive GI symptoms overnight, she should go to the ER for further evaluation. Patient expressed understanding and agreed with the plan.  The total duration of the telephone call was approximately 30 minutes.

## 2019-12-22 ENCOUNTER — Encounter (HOSPITAL_BASED_OUTPATIENT_CLINIC_OR_DEPARTMENT_OTHER): Payer: 59 | Attending: Internal Medicine | Admitting: Internal Medicine

## 2019-12-22 ENCOUNTER — Inpatient Hospital Stay (HOSPITAL_BASED_OUTPATIENT_CLINIC_OR_DEPARTMENT_OTHER): Payer: 59 | Admitting: Medical

## 2019-12-22 ENCOUNTER — Other Ambulatory Visit: Payer: Self-pay

## 2019-12-22 ENCOUNTER — Other Ambulatory Visit: Payer: Self-pay | Admitting: Medical

## 2019-12-22 ENCOUNTER — Other Ambulatory Visit: Payer: Self-pay | Admitting: Oncology

## 2019-12-22 ENCOUNTER — Inpatient Hospital Stay: Payer: 59

## 2019-12-22 VITALS — BP 129/78 | HR 86 | Temp 98.9°F | Resp 16

## 2019-12-22 DIAGNOSIS — Z79899 Other long term (current) drug therapy: Secondary | ICD-10-CM | POA: Insufficient documentation

## 2019-12-22 DIAGNOSIS — C799 Secondary malignant neoplasm of unspecified site: Secondary | ICD-10-CM | POA: Insufficient documentation

## 2019-12-22 DIAGNOSIS — T8131XA Disruption of external operation (surgical) wound, not elsewhere classified, initial encounter: Secondary | ICD-10-CM | POA: Insufficient documentation

## 2019-12-22 DIAGNOSIS — A499 Bacterial infection, unspecified: Secondary | ICD-10-CM

## 2019-12-22 DIAGNOSIS — C50912 Malignant neoplasm of unspecified site of left female breast: Secondary | ICD-10-CM | POA: Insufficient documentation

## 2019-12-22 DIAGNOSIS — A0472 Enterocolitis due to Clostridium difficile, not specified as recurrent: Secondary | ICD-10-CM | POA: Diagnosis not present

## 2019-12-22 DIAGNOSIS — E11622 Type 2 diabetes mellitus with other skin ulcer: Secondary | ICD-10-CM | POA: Diagnosis not present

## 2019-12-22 DIAGNOSIS — C50812 Malignant neoplasm of overlapping sites of left female breast: Secondary | ICD-10-CM

## 2019-12-22 DIAGNOSIS — I87311 Chronic venous hypertension (idiopathic) with ulcer of right lower extremity: Secondary | ICD-10-CM | POA: Insufficient documentation

## 2019-12-22 DIAGNOSIS — Z5112 Encounter for antineoplastic immunotherapy: Secondary | ICD-10-CM | POA: Diagnosis not present

## 2019-12-22 DIAGNOSIS — L97312 Non-pressure chronic ulcer of right ankle with fat layer exposed: Secondary | ICD-10-CM | POA: Insufficient documentation

## 2019-12-22 DIAGNOSIS — Z95828 Presence of other vascular implants and grafts: Secondary | ICD-10-CM

## 2019-12-22 DIAGNOSIS — Z8542 Personal history of malignant neoplasm of other parts of uterus: Secondary | ICD-10-CM | POA: Insufficient documentation

## 2019-12-22 DIAGNOSIS — Y838 Other surgical procedures as the cause of abnormal reaction of the patient, or of later complication, without mention of misadventure at the time of the procedure: Secondary | ICD-10-CM | POA: Diagnosis not present

## 2019-12-22 DIAGNOSIS — Z171 Estrogen receptor negative status [ER-]: Secondary | ICD-10-CM

## 2019-12-22 DIAGNOSIS — Z96653 Presence of artificial knee joint, bilateral: Secondary | ICD-10-CM | POA: Insufficient documentation

## 2019-12-22 MED ORDER — SODIUM CHLORIDE 0.9 % IV SOLN
Freq: Once | INTRAVENOUS | Status: AC
  Filled 2019-12-22: qty 250

## 2019-12-22 MED ORDER — SODIUM CHLORIDE 0.9% FLUSH
10.0000 mL | Freq: Once | INTRAVENOUS | Status: AC
  Administered 2019-12-22: 10 mL
  Filled 2019-12-22: qty 10

## 2019-12-22 MED ORDER — BACTROBAN NASAL 2 % NA OINT: 1.0000 "application " | TOPICAL_OINTMENT | Freq: Two times a day (BID) | NASAL | 0 refills | Status: DC

## 2019-12-22 MED ORDER — METRONIDAZOLE 500 MG PO TABS: 500.0000 mg | ORAL_TABLET | Freq: Three times a day (TID) | ORAL | 0 refills | Status: DC

## 2019-12-22 MED ORDER — HEPARIN SOD (PORK) LOCK FLUSH 100 UNIT/ML IV SOLN
500.0000 [IU] | Freq: Once | INTRAVENOUS | Status: AC
  Administered 2019-12-22: 500 [IU]
  Filled 2019-12-22: qty 5

## 2019-12-22 NOTE — Patient Instructions (Signed)
Rehydration, Adult Rehydration is the replacement of body fluids and salts and minerals (electrolytes) that are lost during dehydration. Dehydration is when there is not enough fluid or water in the body. This happens when you lose more fluids than you take in. Common causes of dehydration include:  Vomiting.  Diarrhea.  Excessive sweating, such as from heat exposure or exercise.  Taking medicines that cause the body to lose excess fluid (diuretics).  Impaired kidney function.  Not drinking enough fluid.  Certain illnesses or infections.  Certain poorly controlled long-term (chronic) illnesses, such as diabetes, heart disease, and kidney disease.  Symptoms of mild dehydration may include thirst, dry lips and mouth, dry skin, and dizziness. Symptoms of severe dehydration may include increased heart rate, confusion, fainting, and not urinating. You can rehydrate by drinking certain fluids or getting fluids through an IV tube, as told by your health care provider. What are the risks? Generally, rehydration is safe. However, one problem that can happen is taking in too much fluid (overhydration). This is rare. If overhydration happens, it can cause an electrolyte imbalance, kidney failure, or a decrease in salt (sodium) levels in the body. How to rehydrate Follow instructions from your health care provider for rehydration. The kind of fluid you should drink and the amount you should drink depend on your condition.  If directed by your health care provider, drink an oral rehydration solution (ORS). This is a drink designed to treat dehydration that is found in pharmacies and retail stores. ? Make an ORS by following instructions on the package. ? Start by drinking small amounts, about  cup (120 mL) every 5-10 minutes. ? Slowly increase how much you drink until you have taken the amount recommended by your health care provider.  Drink enough clear fluids to keep your urine clear or pale  yellow. If you were instructed to drink an ORS, finish the ORS first, then start slowly drinking other clear fluids. Drink fluids such as: ? Water. Do not drink only water. Doing that can lead to having too little sodium in your body (hyponatremia). ? Ice chips. ? Fruit juice that you have added water to (diluted juice). ? Low-calorie sports drinks.  If you are severely dehydrated, your health care provider may recommend that you receive fluids through an IV tube in the hospital.  Do not take sodium tablets. Doing that can lead to the condition of having too much sodium in your body (hypernatremia). Eating while you rehydrate Follow instructions from your health care provider about what to eat while you rehydrate. Your health care provider may recommend that you slowly begin eating regular foods in small amounts.  Eat foods that contain a healthy balance of electrolytes, such as bananas, oranges, potatoes, tomatoes, and spinach.  Avoid foods that are greasy or contain a lot of fat or sugar.  In some cases, you may get nutrition through a feeding tube that is passed through your nose and into your stomach (nasogastric tube, or NG tube). This may be done if you have uncontrolled vomiting or diarrhea. Beverages to avoid Certain beverages may make dehydration worse. While you rehydrate, avoid:  Alcohol.  Caffeine.  Drinks that contain a lot of sugar. These include: ? High-calorie sports drinks. ? Fruit juice that is not diluted. ? Soda.  Check nutrition labels to see how much sugar or caffeine a beverage contains. Signs of dehydration recovery You may be recovering from dehydration if:  You are urinating more often than before you started   rehydrating.  Your urine is clear or pale yellow.  Your energy level improves.  You vomit less frequently.  You have diarrhea less frequently.  Your appetite improves or returns to normal.  You feel less dizzy or less light-headed.  Your  skin tone and color start to look more normal. Contact a health care provider if:  You continue to have symptoms of mild dehydration, such as: ? Thirst. ? Dry lips. ? Slightly dry mouth. ? Dry, warm skin. ? Dizziness.  You continue to vomit or have diarrhea. Get help right away if:  You have symptoms of dehydration that get worse.  You feel: ? Confused. ? Weak. ? Like you are going to faint.  You have not urinated in 6-8 hours.  You have very dark urine.  You have trouble breathing.  Your heart rate while sitting still is over 100 beats a minute.  You cannot drink fluids without vomiting.  You have vomiting or diarrhea that: ? Gets worse. ? Does not go away.  You have a fever. This information is not intended to replace advice given to you by your health care provider. Make sure you discuss any questions you have with your health care provider. Document Revised: 09/17/2017 Document Reviewed: 11/29/2015 Elsevier Patient Education  [place] Difficile Infection Clostridioides difficile, or C. diff, infection is caused by germs (bacteria). It causes irritation and swelling of the colon (colitis). This infection can spread from person to person (is contagious). You may also get C. diff from food or water, or from touching surfaces that have the germs on them. What are the causes? Certain germs live in the colon and help to digest food. This infection starts when the balance of helpful germs in the colon changes, and the C. diff germs grow out of control. This is caused by taking antibiotics. What increases the risk? Your risk is higher if you:  Take certain antibiotics that kill many types of germs.  Take antibiotics for a long time.  Stay for a long time in a health care setting, such as: ? A hospital. ? A long-term care facility.  Are older than age 43. Your risk is somewhat higher if you:  Have had C. diff infection before or had  contact with C. diff germs.  Have a weak body defense system (immune system).  Take a medicine for a long time that reduces stomach acid. This includes proton pump inhibitors.  Have serious health problems, such as: ? Colon cancer. ? Inflammatory bowel disease (IBD).  Have had a procedure or surgery on your gastrointestinal (GI) tract. Some people develop C. diff even though they are not clearly at risk. What are the signs or symptoms?  Watery poop (diarrhea).  Fever.  Tiredness (fatigue).  Loss of appetite.  Nausea.  Swelling, pain, cramping, or tenderness in your belly (abdomen). How is this treated?  Stopping the antibiotics that you were taking when the C. diff infection began. Do this only as told by your doctor.  Taking certain antibiotics to stop C. diff from growing.  Taking donor poop (stool) from a healthy person and placing it into the colon. This may be done if the infection keeps coming back.  Having surgery to remove the infected part of the colon. This is rare. Follow these instructions at home: Medicines  Take over-the-counter and prescription medicines only as told by your doctor.  Take your antibiotic medicine as told by your doctor. Do not stop  taking the antibiotic even if you start to feel better.  Do not treat watery poop with medicines unless your doctor tells you to. Eating and drinking   Follow instructions from your doctor about eating or drinking.  Eat bland foods in small amounts as you are able. These foods include: ? Bananas. ? Applesauce. ? Rice. ? Low-fat (lean) meats. ? Toast. ? Crackers.  Follow your doctor's instructions on how to get enough fluids into your body. You may need to: ? Drink clear fluids. This includes water, fruit juice you have added water to, or low-calorie sports drinks. ? Suck on ice chips. ? Take an ORS (oral rehydration solution).  Avoid milk, caffeine, and alcohol.  Drink enough fluid to keep your  pee (urine) pale yellow. Activity  Rest as told by your doctor.  Return to your normal activities as told by your doctor. Ask your doctor what activities are safe for you. General instructions  Wash your hands often with soap and water. Do this for at least 20 seconds. Bathe using soap and water daily.  Be sure your home is clean before you leave the hospital or clinic to go home.  Continue daily cleaning for at least a week after going home.  Keep all follow-up visits as told by your doctor. This is important. How is this prevented? Hand hygiene   Wash your hands well before you cook and after you use the bathroom. Use soap and water for at least 20 seconds. Make sure that people who live with you also wash their hands often.  If you are being treated at a hospital or clinic, make sure that: ? All doctors and nurses wash their hands with soap and water before touching you. ? All visitors wash their hands with soap and water before touching you. Contact precautions  If you get watery poop while you are in the hospital or a long-term care facility, let your doctor know right away.  When you visit someone in the hospital or a long-term care facility, follow the rules for wearing a gown, gloves, or other protective equipment.  If possible, avoid contact with people who have watery poop.  If you are sick and live with other people, use a separate bathroom, if you can. Clean environment  Clean surfaces that are touched often every day. Use a product that has chlorine bleach in it. The bleach should be 10% solution. Be sure to: ? Read the instructions to find out if the product you are using will work on what you are cleaning. ? Clean toilets, bathtubs, sinks, door knobs, and work surfaces.  If you are in the hospital, make sure that the staff cleans the surfaces in your room each day. Tell someone right away if body fluids have splashed or spilled. Washing clothes and linens  Use  laundry soap that has chlorine bleach in it to wash clothes and linens. Be sure to: ? Use powder soap instead of liquid. ? Run your washing machine on the hot setting with nothing but soap in it. Do this once a month. Contact a doctor if:  Your symptoms do not get better or they get worse.  Your symptoms go away and then come back.  You have a fever.  You have new symptoms. Get help right away if:  You have more pain or tenderness in your belly.  Your poop is mostly bloody.  Your poop looks dark black and tarry.  You cannot eat or drink without vomiting.  You have signs of not having enough fluids in your body. These include: ? Dark pee, very little pee, or no pee. ? Cracked lips or dry mouth. ? No tears when you cry. ? Sunken eyes. ? Feeling sleepy. ? Feeling weak or dizzy. Summary  C. diff infection may happen after taking antibiotic medicines.  Symptoms include watery poop, fever, tiredness, loss of appetite, nausea, and belly problems.  Treatment starts with stopping the antibiotics you were using when the infection began. Certain antibiotics are then used to stop C. diff from growing.  This infection is sometimes treated by placing donor poop into the colon or doing surgery.  Washing hands and cleaning every day can help keep C. diff from spreading. This information is not intended to replace advice given to you by your health care provider. Make sure you discuss any questions you have with your health care provider. Document Revised: 03/01/2019 Document Reviewed: 03/01/2019 Elsevier Patient Education  [place].

## 2019-12-22 NOTE — Progress Notes (Signed)
FYI, I sent in a script for Flagyl. Lucianne Lei

## 2019-12-22 NOTE — Progress Notes (Signed)
The patient was seen in the infusion room today.  She has been diagnosed with C. difficile.  It was explained to the patient that her presentation with C. difficile was unusual as she had not had any antibiotics a short time before her diagnosis.  She was placed on Flagyl last evening.  She continues to have diarrhea.  She was told to continue taking her Flagyl as prescribed.  She was told to avoid alcohol.  She was told to stop using Lomotil and or Imodium while she is taking Flagyl.  She also reported having a small abscess on the inside of her right nares.  She now has an area of induration and erythema along her left external nares.  She was given a prescription for Bactroban.  The patient expressed understanding and agreement with this plan.  Sandi Mealy, MHS, PA-C Physician Assistant

## 2019-12-23 ENCOUNTER — Inpatient Hospital Stay: Payer: 59

## 2019-12-23 ENCOUNTER — Other Ambulatory Visit: Payer: Self-pay

## 2019-12-23 VITALS — BP 119/95 | HR 85 | Temp 98.3°F | Resp 17

## 2019-12-23 DIAGNOSIS — Z5112 Encounter for antineoplastic immunotherapy: Secondary | ICD-10-CM | POA: Diagnosis not present

## 2019-12-23 MED ORDER — HEPARIN SOD (PORK) LOCK FLUSH 100 UNIT/ML IV SOLN
500.0000 [IU] | INTRAVENOUS | Status: AC | PRN
  Administered 2019-12-23: 500 [IU]
  Filled 2019-12-23: qty 5

## 2019-12-23 MED ORDER — SODIUM CHLORIDE 0.9 % IV SOLN
Freq: Once | INTRAVENOUS | Status: AC
  Filled 2019-12-23: qty 250

## 2019-12-23 MED ORDER — SODIUM CHLORIDE 0.9% FLUSH
10.0000 mL | INTRAVENOUS | Status: AC | PRN
  Administered 2019-12-23: 10 mL
  Filled 2019-12-23: qty 10

## 2019-12-23 NOTE — Patient Instructions (Signed)
Rehydration, Adult Rehydration is the replacement of body fluids and salts and minerals (electrolytes) that are lost during dehydration. Dehydration is when there is not enough fluid or water in the body. This happens when you lose more fluids than you take in. Common causes of dehydration include:  Vomiting.  Diarrhea.  Excessive sweating, such as from heat exposure or exercise.  Taking medicines that cause the body to lose excess fluid (diuretics).  Impaired kidney function.  Not drinking enough fluid.  Certain illnesses or infections.  Certain poorly controlled long-term (chronic) illnesses, such as diabetes, heart disease, and kidney disease.  Symptoms of mild dehydration may include thirst, dry lips and mouth, dry skin, and dizziness. Symptoms of severe dehydration may include increased heart rate, confusion, fainting, and not urinating. You can rehydrate by drinking certain fluids or getting fluids through an IV tube, as told by your health care provider. What are the risks? Generally, rehydration is safe. However, one problem that can happen is taking in too much fluid (overhydration). This is rare. If overhydration happens, it can cause an electrolyte imbalance, kidney failure, or a decrease in salt (sodium) levels in the body. How to rehydrate Follow instructions from your health care provider for rehydration. The kind of fluid you should drink and the amount you should drink depend on your condition.  If directed by your health care provider, drink an oral rehydration solution (ORS). This is a drink designed to treat dehydration that is found in pharmacies and retail stores. ? Make an ORS by following instructions on the package. ? Start by drinking small amounts, about  cup (120 mL) every 5-10 minutes. ? Slowly increase how much you drink until you have taken the amount recommended by your health care provider.  Drink enough clear fluids to keep your urine clear or pale  yellow. If you were instructed to drink an ORS, finish the ORS first, then start slowly drinking other clear fluids. Drink fluids such as: ? Water. Do not drink only water. Doing that can lead to having too little sodium in your body (hyponatremia). ? Ice chips. ? Fruit juice that you have added water to (diluted juice). ? Low-calorie sports drinks.  If you are severely dehydrated, your health care provider may recommend that you receive fluids through an IV tube in the hospital.  Do not take sodium tablets. Doing that can lead to the condition of having too much sodium in your body (hypernatremia). Eating while you rehydrate Follow instructions from your health care provider about what to eat while you rehydrate. Your health care provider may recommend that you slowly begin eating regular foods in small amounts.  Eat foods that contain a healthy balance of electrolytes, such as bananas, oranges, potatoes, tomatoes, and spinach.  Avoid foods that are greasy or contain a lot of fat or sugar.  In some cases, you may get nutrition through a feeding tube that is passed through your nose and into your stomach (nasogastric tube, or NG tube). This may be done if you have uncontrolled vomiting or diarrhea. Beverages to avoid Certain beverages may make dehydration worse. While you rehydrate, avoid:  Alcohol.  Caffeine.  Drinks that contain a lot of sugar. These include: ? High-calorie sports drinks. ? Fruit juice that is not diluted. ? Soda.  Check nutrition labels to see how much sugar or caffeine a beverage contains. Signs of dehydration recovery You may be recovering from dehydration if:  You are urinating more often than before you started   rehydrating.  Your urine is clear or pale yellow.  Your energy level improves.  You vomit less frequently.  You have diarrhea less frequently.  Your appetite improves or returns to normal.  You feel less dizzy or less light-headed.  Your  skin tone and color start to look more normal. Contact a health care provider if:  You continue to have symptoms of mild dehydration, such as: ? Thirst. ? Dry lips. ? Slightly dry mouth. ? Dry, warm skin. ? Dizziness.  You continue to vomit or have diarrhea. Get help right away if:  You have symptoms of dehydration that get worse.  You feel: ? Confused. ? Weak. ? Like you are going to faint.  You have not urinated in 6-8 hours.  You have very dark urine.  You have trouble breathing.  Your heart rate while sitting still is over 100 beats a minute.  You cannot drink fluids without vomiting.  You have vomiting or diarrhea that: ? Gets worse. ? Does not go away.  You have a fever. This information is not intended to replace advice given to you by your health care provider. Make sure you discuss any questions you have with your health care provider. Document Revised: 09/17/2017 Document Reviewed: 11/29/2015 Elsevier Patient Education  [place] Difficile Infection Clostridioides difficile, or C. diff, infection is caused by germs (bacteria). It causes irritation and swelling of the colon (colitis). This infection can spread from person to person (is contagious). You may also get C. diff from food or water, or from touching surfaces that have the germs on them. What are the causes? Certain germs live in the colon and help to digest food. This infection starts when the balance of helpful germs in the colon changes, and the C. diff germs grow out of control. This is caused by taking antibiotics. What increases the risk? Your risk is higher if you:  Take certain antibiotics that kill many types of germs.  Take antibiotics for a long time.  Stay for a long time in a health care setting, such as: ? A hospital. ? A long-term care facility.  Are older than age 62. Your risk is somewhat higher if you:  Have had C. diff infection before or had  contact with C. diff germs.  Have a weak body defense system (immune system).  Take a medicine for a long time that reduces stomach acid. This includes proton pump inhibitors.  Have serious health problems, such as: ? Colon cancer. ? Inflammatory bowel disease (IBD).  Have had a procedure or surgery on your gastrointestinal (GI) tract. Some people develop C. diff even though they are not clearly at risk. What are the signs or symptoms?  Watery poop (diarrhea).  Fever.  Tiredness (fatigue).  Loss of appetite.  Nausea.  Swelling, pain, cramping, or tenderness in your belly (abdomen). How is this treated?  Stopping the antibiotics that you were taking when the C. diff infection began. Do this only as told by your doctor.  Taking certain antibiotics to stop C. diff from growing.  Taking donor poop (stool) from a healthy person and placing it into the colon. This may be done if the infection keeps coming back.  Having surgery to remove the infected part of the colon. This is rare. Follow these instructions at home: Medicines  Take over-the-counter and prescription medicines only as told by your doctor.  Take your antibiotic medicine as told by your doctor. Do not stop  taking the antibiotic even if you start to feel better.  Do not treat watery poop with medicines unless your doctor tells you to. Eating and drinking   Follow instructions from your doctor about eating or drinking.  Eat bland foods in small amounts as you are able. These foods include: ? Bananas. ? Applesauce. ? Rice. ? Low-fat (lean) meats. ? Toast. ? Crackers.  Follow your doctor's instructions on how to get enough fluids into your body. You may need to: ? Drink clear fluids. This includes water, fruit juice you have added water to, or low-calorie sports drinks. ? Suck on ice chips. ? Take an ORS (oral rehydration solution).  Avoid milk, caffeine, and alcohol.  Drink enough fluid to keep your  pee (urine) pale yellow. Activity  Rest as told by your doctor.  Return to your normal activities as told by your doctor. Ask your doctor what activities are safe for you. General instructions  Wash your hands often with soap and water. Do this for at least 20 seconds. Bathe using soap and water daily.  Be sure your home is clean before you leave the hospital or clinic to go home.  Continue daily cleaning for at least a week after going home.  Keep all follow-up visits as told by your doctor. This is important. How is this prevented? Hand hygiene   Wash your hands well before you cook and after you use the bathroom. Use soap and water for at least 20 seconds. Make sure that people who live with you also wash their hands often.  If you are being treated at a hospital or clinic, make sure that: ? All doctors and nurses wash their hands with soap and water before touching you. ? All visitors wash their hands with soap and water before touching you. Contact precautions  If you get watery poop while you are in the hospital or a long-term care facility, let your doctor know right away.  When you visit someone in the hospital or a long-term care facility, follow the rules for wearing a gown, gloves, or other protective equipment.  If possible, avoid contact with people who have watery poop.  If you are sick and live with other people, use a separate bathroom, if you can. Clean environment  Clean surfaces that are touched often every day. Use a product that has chlorine bleach in it. The bleach should be 10% solution. Be sure to: ? Read the instructions to find out if the product you are using will work on what you are cleaning. ? Clean toilets, bathtubs, sinks, door knobs, and work surfaces.  If you are in the hospital, make sure that the staff cleans the surfaces in your room each day. Tell someone right away if body fluids have splashed or spilled. Washing clothes and linens  Use  laundry soap that has chlorine bleach in it to wash clothes and linens. Be sure to: ? Use powder soap instead of liquid. ? Run your washing machine on the hot setting with nothing but soap in it. Do this once a month. Contact a doctor if:  Your symptoms do not get better or they get worse.  Your symptoms go away and then come back.  You have a fever.  You have new symptoms. Get help right away if:  You have more pain or tenderness in your belly.  Your poop is mostly bloody.  Your poop looks dark black and tarry.  You cannot eat or drink without vomiting.  You have signs of not having enough fluids in your body. These include: ? Dark pee, very little pee, or no pee. ? Cracked lips or dry mouth. ? No tears when you cry. ? Sunken eyes. ? Feeling sleepy. ? Feeling weak or dizzy. Summary  C. diff infection may happen after taking antibiotic medicines.  Symptoms include watery poop, fever, tiredness, loss of appetite, nausea, and belly problems.  Treatment starts with stopping the antibiotics you were using when the infection began. Certain antibiotics are then used to stop C. diff from growing.  This infection is sometimes treated by placing donor poop into the colon or doing surgery.  Washing hands and cleaning every day can help keep C. diff from spreading. This information is not intended to replace advice given to you by your health care provider. Make sure you discuss any questions you have with your health care provider. Document Revised: 03/01/2019 Document Reviewed: 03/01/2019 Elsevier Patient Education  [place].

## 2019-12-25 ENCOUNTER — Other Ambulatory Visit: Payer: Self-pay | Admitting: Oncology

## 2019-12-25 NOTE — Progress Notes (Signed)
[name], [name] ([id number]) Visit Report for 12/22/2019 Arrival Information Details Patient Name: Date of Service: [name], [name] 12/22/2019 9:15 AM Medical Record [id number] Patient Account Number: [id number] Date of Birth/Sex: Treating RN: [birth year] (64 y.o. Benjamine Sprague, Briant Cedar Primary Care Gabreil Yon[place]: Cathlean Cower Other Clinician: Referring Bionca Mckey: Treating Taffie Eckmann/Extender:Robson, Arta Bruce, Casper Harrison in Treatment: 7 Visit Information History Since Last Visit Cane Added or deleted any medications: No Patient Arrived: 09:37 Any new allergies or adverse reactions: No Arrival Time: alone Had a fall or experienced change in No Accompanied By: None activities of daily living that may affect Transfer Assistance: risk of falls: Patient Identification Verified: Yes Signs or symptoms of abuse/neglect since last No Secondary Verification Process Completed: Yes visito Patient Requires Transmission-Based No Hospitalized since last visit: No Precautions: Implantable device outside of the clinic excluding No Patient Has Alerts: No cellular tissue based products placed in the center since last visit: Has Dressing in Place as Prescribed: Yes Pain Present Now: No Electronic Signature(s) Signed: 12/25/2019 5:59:01 PM By: Levan Hurst RN, BSN Entered By: Levan Hurst on 12/22/2019 09:40:55 -------------------------------------------------------------------------------- Lower Extremity Assessment Details Patient Name: Date of Service: [name] 12/22/2019 9:15 AM Medical Record [id number] Patient Account Number: [id number] Date of Birth/Sex: Treating RN: [birth year] (64 y.o. Nancy Fetter Primary Care Latrecia Capito: Cathlean Cower Other Clinician: Referring Laelynn Blizzard: Treating Levon Penning/Extender:Robson, Arta Bruce, Casper Harrison in Treatment: 7 Edema Assessment Assessed: [Left: No] [Right: No] Edema: [Left: Ye] [Right: s] Calf Left: Right: Point  of Measurement: cm From Medial Instep cm 37.5 cm Ankle Left: Right: Point of Measurement: cm From Medial Instep cm 24.2 cm Vascular Assessment Pulses: Dorsalis Pedis Palpable: [Right:Yes] Electronic Signature(s) Signed: 12/25/2019 5:59:01 PM By: Levan Hurst RN, BSN Entered By: Levan Hurst on 12/22/2019 09:46:13 -------------------------------------------------------------------------------- Multi Wound Chart Details Patient Name: Date of Service: [name] 12/22/2019 9:15 AM Medical Record [id number] Patient Account Number: [id number] Date of Birth/Sex: Treating RN: 18-May-1956 (64 y.o. [name], [name] Primary Care Ayrabella Labombard: Cathlean Cower Other Clinician: Referring Benjamyn Hestand: Treating Lourene Hoston/Extender:Robson, Arta Bruce, Casper Harrison in Treatment: 7 Vital Signs Height(in): 41 Pulse(bpm): 99 Weight(lbs): 250 Blood Pressure(mmHg): 112/79 Body Mass Index(BMI): 50 Temperature(F): 98.6 Respiratory 18 Rate(breaths/min): Photos: [1:No Photos] [N/A:N/A] Wound Location: [1:Right Achilles] [N/A:N/A] Wounding Event: [1:Trauma] [N/A:N/A] Primary Etiology: [1:Diabetic Wound/Ulcer of the N/A Lower Extremity] Comorbid History: [1:Anemia, Lymphedema, Sleep Apnea, Hypertension, Type II Diabetes, Osteoarthritis, Received Radiation] [N/A:N/A] Date Acquired: [1:10/16/2019] [N/A:N/A] Weeks of Treatment: [1:7] [N/A:N/A] Wound Status: [1:Open] [N/A:N/A] Measurements L x W x D 1.3x0.3x0.1 [N/A:N/A] (cm) Area (cm) : [1:0.306] [N/A:N/A] Volume (cm) : [1:0.031] [N/A:N/A] % Reduction in Area: [1:18.80%] [N/A:N/A] % Reduction in Volume: [1:72.60%] [N/A:N/A] Classification: [1:Grade 1] [N/A:N/A] Exudate Amount: [1:Medium] [N/A:N/A] Exudate Type: [1:Serosanguineous] [N/A:N/A] Exudate Color: [1:red, brown] [N/A:N/A] Wound Margin: [1:Epibole] [N/A:N/A] Granulation Amount: [1:Small (1-33%)] [N/A:N/A] Granulation Quality: [1:Pink] [N/A:N/A] Necrotic Amount: [1:Large  (67-100%)] [N/A:N/A] Necrotic Tissue: [1:Eschar] [N/A:N/A] Exposed Structures: [1:Fat Layer (Subcutaneous Tissue) Exposed: Yes Fascia: No Tendon: No Muscle: No Joint: No Bone: No] [N/A:N/A] Epithelialization: [1:Small (1-33%)] [N/A:N/A] Debridement: [1:Debridement - Excisional] [N/A:N/A] Pre-procedure [1:10:30] [N/A:N/A] Verification/Time Out Taken: Pain Control: [1:Lidocaine 4% Topical Solution] [N/A:N/A] Tissue Debrided: [1:Subcutaneous, Slough] [N/A:N/A] Level: [1:Skin/Subcutaneous Tissue] [N/A:N/A] Debridement Area (sq cm):0.39 [N/A:N/A] Instrument: [1:Curette] [N/A:N/A] Bleeding: [1:Minimum] [N/A:N/A] Hemostasis Achieved: [1:Pressure] [N/A:N/A] Procedural Pain: [1:2] [N/A:N/A] Post Procedural Pain: [1:0] [N/A:N/A] Debridement Treatment Procedure was tolerated [N/A:N/A] Response: [1:well] Post Debridement [1:1.3x0.3x0.1] [N/A:N/A] Measurements L x W x D (cm) Post Debridement [1:0.031] [N/A:N/A] Volume: (cm) Procedures Performed: Debridement [N/A:N/A] Treatment Notes  Electronic Signature(s) Signed: 12/22/2019 5:21:33 PM By: Linton Ham MD Signed: 12/25/2019 5:31:36 PM By: Kela Millin Entered By: Linton Ham on 12/22/2019 10:57:48 -------------------------------------------------------------------------------- Multi-Disciplinary Care Plan Details Patient Name: Date of Service: [name], [name] 12/22/2019 9:15 AM Medical Record [id number] Patient Account Number: [id number] Date of Birth/Sex: Treating RN: 1956/06/15 (64 y.o. [name] Primary Care Murry Diaz: Cathlean Cower Other Clinician: Referring Tishia Maestre: Treating Amarie Viles/Extender:Robson, Arta Bruce, Casper Harrison in Treatment: 7 Active Inactive Nutrition Nursing Diagnoses: Imbalanced nutrition Potential for alteratiion in Nutrition/Potential for imbalanced nutrition Goals: Patient/caregiver agrees to and verbalizes understanding of need to use nutritional supplements and/or vitamins  as prescribed Date Initiated: 11/02/2019 Target Resolution Date: 12/29/2019 Goal Status: Active Patient/caregiver will maintain therapeutic glucose control Date Initiated: 11/02/2019 Target Resolution Date: 12/29/2019 Goal Status: Active Interventions: Assess HgA1c results as ordered upon admission and as needed Assess patient nutrition upon admission and as needed per policy Provide education on elevated blood sugars and impact on wound healing Provide education on nutrition Treatment Activities: Education provided on Nutrition : 12/01/2019 Notes: Wound/Skin Impairment Nursing Diagnoses: Impaired tissue integrity Knowledge deficit related to ulceration/compromised skin integrity Goals: Patient/caregiver will verbalize understanding of skin care regimen Date Initiated: 11/02/2019 Target Resolution Date: 01/19/2020 Goal Status: Active Ulcer/skin breakdown will have a volume reduction of 30% by week 4 Date Initiated: 11/02/2019 Date Inactivated: 12/22/2019 Target Resolution Date: 12/01/2019 Goal Status: Met Ulcer/skin breakdown will have a volume reduction of 80% by week 12 Date Initiated: 12/22/2019 Target Resolution Date: 01/19/2020 Goal Status: Active Interventions: Assess patient/caregiver ability to obtain necessary supplies Assess patient/caregiver ability to perform ulcer/skin care regimen upon admission and as needed Assess ulceration(s) every visit Provide education on ulcer and skin care Notes: Electronic Signature(s) Signed: 12/22/2019 5:33:53 PM By: Baruch Gouty RN, BSN Entered By: Baruch Gouty on 12/22/2019 09:31:30 -------------------------------------------------------------------------------- Pain Assessment Details Patient Name: Date of Service: [name] 12/22/2019 9:15 AM Medical Record [id number] Patient Account Number: [id number] Date of Birth/Sex: Treating RN: 02/26/1956 (64 y.o. Nancy Fetter Primary Care Itzael Liptak: Cathlean Cower Other  Clinician: Referring Lashica Hannay: Treating Lillyona Polasek/Extender:Robson, Arta Bruce, Casper Harrison in Treatment: 7 Active Problems Location of Pain Severity and Description of Pain Patient Has Paino No Site Locations Pain Management and Medication Current Pain Management: Electronic Signature(s) Signed: 12/25/2019 5:59:01 PM By: Levan Hurst RN, BSN Entered By: Levan Hurst on 12/22/2019 09:41:21 Patient/Caregiver Education Details Laverle Patter 3/5/2021andnbsp9:15 -------------------------------------------------------------------------------- Laverle Patter 3/5/2021andnbsp9:15 Patient Name: Date of Service: V. AM Medical Record Patient Account Number: [id number] Number: Treating RN: Baruch Gouty Date of Birth/Gender: Dec 05, 1955 (63 y.o. F) Other Clinician: Primary Care Physician: Particia Nearing Referring Physician: Physician/Extender: Rogene Houston in Treatment: 7 Education Assessment Education Provided To: Patient Education Topics Provided Elevated Blood Sugar/ Impact on Healing: Methods: Explain/Verbal Responses: Reinforcements needed, State content correctly Wound/Skin Impairment: Methods: Explain/Verbal Responses: Reinforcements needed, State content correctly Electronic Signature(s) Signed: 12/22/2019 5:33:53 PM By: Baruch Gouty RN, BSN Entered By: Baruch Gouty on 12/22/2019 09:31:48 -------------------------------------------------------------------------------- Wound Assessment Details Patient Name: Date of Service: [name] 12/22/2019 9:15 AM Medical Record [id number] Patient Account Number: [id number] Date of Birth/Sex: Treating RN: 05-13-1956 (64 y.o. Nancy Fetter Primary Care Sandro Burgo: Cathlean Cower Other Clinician: Referring Ridge Lafond: Treating Angellynn Kimberlin/Extender:Robson, Arta Bruce, Casper Harrison in Treatment: 7 Wound Status Wound Number: 1 Primary Diabetic Wound/Ulcer of the  Lower Extremity Etiology: Wound Location: Right Achilles Wound Open Wounding Event: Trauma Status: Date Acquired: 10/16/2019 Comorbid Anemia, Lymphedema, Sleep Apnea, Weeks Of Treatment: 7 History: Hypertension, Type  II Diabetes, Osteoarthritis, Clustered Wound: No Received Radiation Wound Measurements Length: (cm) 1.3 Width: (cm) 0.3 Depth: (cm) 0.1 Area: (cm) 0.306 Volume: (cm) 0.031 Wound Description Classification: Grade 1 Wound Margin: Epibole Exudate Amount: Medium Exudate Type: Serosanguineous Exudate Color: red, brown Wound Bed Granulation Amount: Small (1-33%) Granulation Quality: Pink Necrotic Amount: Large (67-100%) Necrotic Quality: Eschar Foul Odor After Cleansing: No Slough/Fibrino Yes Exposed Structure Fascia Exposed: No Fat Layer (Subcutaneous Tissue) Exposed: Ye Tendon Exposed: No Muscle Exposed: No Joint Exposed: No Bone Exposed: No % Reduction in Area: 18.8% % Reduction in Volume: 72.6% Epithelialization: Small (1-33%) Tunneling: No Undermining: No s Electronic Signature(s) Signed: 12/25/2019 5:59:01 PM By: Levan Hurst RN, BSN Entered By: Levan Hurst on 12/22/2019 09:48:40 -------------------------------------------------------------------------------- [place] Details Patient Name: Date of Service: [name] 12/22/2019 9:15 AM Medical Record [id number] Patient Account Number: [id number] Date of Birth/Sex: Treating RN: [birth year] (64 y.o. Nancy Fetter Primary Care Tiahna Cure: Cathlean Cower Other Clinician: Referring Emmalyn Hinson: Treating Dylann Layne/Extender:Robson, Arta Bruce, Casper Harrison in Treatment: 7 Vital Signs Time Taken: 09:40 Temperature (F): 98.6 Height (in): 59 Pulse (bpm): 99 Weight (lbs): 250 Respiratory Rate (breaths/min): 18 Body Mass Index (BMI): 50.5 Blood Pressure (mmHg): 112/79 Reference Range: 80 - 120 mg / dl Electronic Signature(s) Signed: 12/25/2019 5:59:01 PM By: Levan Hurst RN,  BSN Entered By: Levan Hurst on 12/22/2019 09:41:16

## 2019-12-25 NOTE — Progress Notes (Signed)
ZENDAYA, EICHORN ([id number]) Visit Report for 12/22/2019 Debridement Details Patient Name: [name], [name]. Date of Service: 12/22/2019 9:15 AM Medical Record [id number] Patient Account Number: [id number] Date of Birth/Sex: Treating RN: [birth year] (64 y.o. Clearnce Sorrel Primary Care Provider: Cathlean Cower Other Clinician: Referring Provider: Treating Provider/Extender:Cariah Salatino, Arta Bruce, Casper Harrison in Treatment: 7 Debridement Performed for Wound #1 Right Achilles Assessment: Performed By: Physician Ricard Dillon., MD Debridement Type: Debridement Severity of Tissue Pre Fat layer exposed Debridement: Level of Consciousness (Pre- Awake and Alert procedure): Pre-procedure Verification/Time Out Taken: Yes - 10:30 Start Time: 10:31 Pain Control: Lidocaine 4% Topical Solution Total Area Debrided (L x W): 1.3 (cm) x 0.3 (cm) = 0.39 (cm) Tissue and other material Viable, Non-Viable, Slough, Subcutaneous, Skin: Epidermis, Slough debrided: Level: Skin/Subcutaneous Tissue Debridement Description: Excisional Instrument: Curette Bleeding: Minimum Hemostasis Achieved: Pressure End Time: 10:33 Procedural Pain: 2 Post Procedural Pain: 0 Response to Treatment: Procedure was tolerated well Level of Consciousness Awake and Alert (Post-procedure): Post Debridement Measurements of Total Wound Length: (cm) 1.3 Width: (cm) 0.3 Depth: (cm) 0.1 Volume: (cm) 0.031 Character of Wound/Ulcer Post Improved Debridement: Severity of Tissue Post Debridement: Fat layer exposed Post Procedure Diagnosis Same as Pre-procedure Electronic Signature(s) Signed: 12/22/2019 5:21:33 PM By: Linton Ham MD Signed: 12/25/2019 5:31:36 PM By: Kela Millin Entered By: Linton Ham on 12/22/2019 10:59:20 -------------------------------------------------------------------------------- HPI Details Patient Name: [name] Date of Service: 12/22/2019 9:15 AM Medical  Record [id number] Patient Account Number: [id number] Date of Birth/Sex: Treating RN: [birth year] (64 y.o. Clearnce Sorrel Primary Care Provider: Cathlean Cower Other Clinician: Referring Provider: Treating Provider/Extender:Jaylenne Hamelin, Arta Bruce, Casper Harrison in Treatment: 7 History of Present Illness HPI Description: 11/02/2019 ADMISSION This is a 64 year old woman who has a wound on the right posterior Achilles area towards the distal end of the Achilles tendon surgery scars. She tells me that roughly 2-1/2 to 3 weeks ago she was picking at some dry skin in the area pulled it off and that is how the wound opened up. She had an original spontaneous Achilles tendon rupture in the early 1990s that was repaired and then very shortly thereafter it reruptured and she required another surgery. She has not had any problems with this since then. The wound itself currently has episodic sharp pain some swelling. She was put on Augmentin by her primary physician on 10/30/2019 for possible coexistent cellulitis. She used hydrogen peroxide for a while with topical antibiotics. Mostly she has been using topical antibiotics as somebody told her that hydrogen peroxide was not good to use. 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 Surgery was in [place], bilateral total knee replacements. ABIs in our clinic on the right were 1.09 1/21; patient here with a linear wound in the right Achilles area. Her wrap fell down she did not call us to replace it. She has questions about her compression wrap necessity. Finally she asked if the wound could be sutured The patient is under a lot of pressure with initiation of chemotherapy she is  going to have a port placed etc. 1/28; linear wound in the right Achilles area in the setting of previous surgery. She is going for a port for chemotherapy for her breast cancer. She has apparently  made herself an appointment with orthopedic surgery. I am not sure who she has made an appointment with at this point. 2/12 linear wound in the right Achilles is come in in terms of length. This is in the setting of previous surgery in this area The patient is undergoing chemotherapy for breast cancer and having significant side effects [diarrhea] I am trying not to bring in her in too often as long as her wound is contracted 3/5; linear wound in the right Achilles. Not quite as long however still some measurable depth. She tells [place] that she has had problems with chemotherapy also apparently pseudomembranous colitis. She has not had much attention to paid to the wound. Came in without any dressing on etc. Electronic Signature(s) Signed: 12/22/2019 5:21:33 PM By: Linton Ham MD Entered By: Linton Ham on 12/22/2019 11:00:02 -------------------------------------------------------------------------------- Physical Exam Details Patient Name: [name] Date of Service: 12/22/2019 9:15 AM Medical Record [id number] Patient Account Number: [id number] Date of Birth/Sex: Treating RN: 10-06-1956 (64 y.o. Clearnce Sorrel Primary Care Provider: Cathlean Cower Other Clinician: Referring Provider: Treating Provider/Extender:Chakira Jachim, Arta Bruce, Casper Harrison in Treatment: 7 Constitutional Sitting or standing Blood Pressure is within target range for patient.. Pulse regular and within target range for patient.[name] Respirations regular, non-labored and within target range.. Temperature is normal and within the target range for the patient.[name] Appears in no distress. Notes Wound exam; the linear wound in the lower part of the Achilles tendon surgical scar. She has debris on the surface and  around the circumference debrided with a #3 curette. Hemostasis with direct pressure. This cleans up quite nicely. It is a wound in scar tissue and it has some relative depth. No evidence of infection Electronic Signature(s) Signed: 12/22/2019 5:21:33 PM By: Linton Ham MD Entered By: Linton Ham on 12/22/2019 11:00:56 -------------------------------------------------------------------------------- Physician Orders Details Patient Name: Date of Service: [name] 12/22/2019 9:15 AM Medical Record [id number] Patient Account Number: [id number] Date of Birth/Sex: Treating RN: 22-Nov-1955 (63 y.o. Martyn Malay, Vaughan Basta Primary Care Provider: Cathlean Cower Other Clinician: Referring Provider: Treating Provider/Extender:Marcas Bowsher, Arta Bruce, Casper Harrison in Treatment: 7 Verbal / Phone Orders: No Diagnosis Coding ICD-10 Coding Code Description [phone] Non-pressure chronic ulcer of right ankle with fat layer exposed Disruption of external operation (surgical) wound, not elsewhere classified, subsequent T81.31XD encounter I87.311 Chronic venous hypertension (idiopathic) with ulcer of right lower extremity E11.622 Type 2 diabetes mellitus with other skin ulcer Follow-up Appointments Return appointment in 1 month. Dressing Change Frequency Wound #1 Right Achilles Change Dressing every other day. Skin Barriers/Peri-Wound Care Wound #1 Right Achilles Moisturizing lotion Wound Cleansing Wound #1 Right Achilles Clean wound with Normal Saline. - or wound cleanser Primary Wound Dressing Wound #1 Right Achilles Hydrofera Blue - classic moisten with normal saline and cut to fit inside wound margins Secondary Dressing Wound #1 Right Achilles Foam Border - or large bandaid. Edema Control Avoid standing for long periods of time Elevate legs to the level of the heart or above for 30 minutes daily and/or when sitting, a frequency of: - throughout the day Exercise  regularly Support Garment 20-30 mm/Hg pressure to: - patient to order compression stockings from [employer]. apply your compression stocking in the morning and remove at night. Electronic Signature(s) Signed: 12/22/2019 5:21:33 PM By: Linton Ham MD Signed: 12/22/2019 5:33:53 PM By: Baruch Gouty RN, BSN Entered By: Baruch Gouty on 12/22/2019 10:35:57 -------------------------------------------------------------------------------- Problem List Details Patient Name: Date of Service: [name] 12/22/2019 9:15 AM Medical Record [id number] Patient  Account Number: [id number] Date of Birth/Sex: Treating RN: Apr 27, 1956 (64 y.o. Elam Dutch Primary Care Provider: Cathlean Cower Other Clinician: Referring Provider: Treating Provider/Extender:Tyeler Goedken, Arta Bruce, Casper Harrison in Treatment: 7 Active Problems ICD-10 Evaluated Encounter Code Description Active Date Today Diagnosis L97.312 Non-pressure chronic ulcer of right ankle with fat layer 11/02/2019 No Yes exposed T81.31XD Disruption of external operation (surgical) wound, not 11/02/2019 No Yes elsewhere classified, subsequent encounter I87.311 Chronic venous hypertension (idiopathic) with ulcer of 11/02/2019 No Yes right lower extremity E11.622 Type 2 diabetes mellitus with other skin ulcer 11/02/2019 No Yes Inactive Problems Resolved Problems Electronic Signature(s) Signed: 12/22/2019 5:21:33 PM By: Linton Ham MD Entered By: Linton Ham on 12/22/2019 10:57:39 -------------------------------------------------------------------------------- Progress Note Details Patient Name: [name] Date of Service: 12/22/2019 9:15 AM Medical Record [id number] Patient Account Number: [id number] Date of Birth/Sex: Treating RN: August 23, 1956 (65 y.o. Clearnce Sorrel Primary Care Provider: Cathlean Cower Other Clinician: Referring Provider: Treating Provider/Extender:Seger Jani, Arta Bruce,  Casper Harrison in Treatment: 7 Subjective History of Present Illness (HPI) 11/02/2019 ADMISSION This is a 64 year old woman who has a wound on the right posterior Achilles area towards the distal end of the Achilles tendon surgery scars. She tells me that roughly 2-1/2 to 3 weeks ago she was picking at some dry skin in the area pulled it off and that is how the wound opened up. She had an original spontaneous Achilles tendon rupture in the early 1990s that was repaired and then very shortly thereafter it reruptured and she required another surgery. She has not had any problems with this since then. The wound itself currently has episodic sharp pain some swelling. She was put on Augmentin by her primary physician on 10/30/2019 for possible coexistent cellulitis. She used hydrogen peroxide for a while with topical antibiotics. Mostly she has been using topical antibiotics as somebody told her that hydrogen peroxide was not good to use. 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 Surgery was in [place], bilateral total knee replacements. ABIs in our clinic on the right were 1.09 1/21; patient here with a linear wound in the right Achilles area. Her wrap fell down she did not call us to replace it. She has questions about her compression wrap necessity. Finally she asked if the wound could be sutured The patient is under a lot of pressure with initiation of chemotherapy she is going to have a port placed etc. 1/28; linear wound in the right Achilles area in the setting of previous surgery. She is going for a port for chemotherapy for her breast cancer. She has apparently made herself an appointment with orthopedic surgery. I  am not sure who she has made an appointment with at this point. 2/12 linear wound in the right Achilles is come in in terms of length. This is in the setting of previous surgery in this area The patient is undergoing chemotherapy for breast cancer and having significant side effects [diarrhea] I am trying not to bring in her in too often as long as her wound is contracted 3/5; linear wound in the right Achilles. Not quite as long however still some measurable depth. She tells [place] that she has had problems with chemotherapy also apparently pseudomembranous colitis. She has not had  much attention to paid to the wound. Came in without any dressing on etc. Objective Constitutional Sitting or standing Blood Pressure is within target range for patient.. Pulse regular and within target range for patient.[name] Respirations regular, non-labored and within target range.. Temperature is normal and within the target range for the patient.[name] Appears in no distress. Vitals Time Taken: 9:40 AM, Height: 59 in, Weight: 250 lbs, BMI: 50.5, Temperature: 98.6 F, Pulse: 99 bpm, Respiratory Rate: 18 breaths/min, Blood Pressure: 112/79 mmHg. General Notes: Wound exam; the linear wound in the lower part of the Achilles tendon surgical scar. She has debris on the surface and around the circumference debrided with a #3 curette. Hemostasis with direct pressure. This cleans up quite nicely. It is a wound in scar tissue and it has some relative depth. No evidence of infection Integumentary (Hair, Skin) Wound #1 status is Open. Original cause of wound was Trauma. The wound is located on the Right Achilles. The wound measures 1.3cm length x 0.3cm width x 0.1cm depth; 0.306cm^2 area and 0.031cm^3 volume. There is Fat Layer (Subcutaneous Tissue) Exposed exposed. There is no tunneling or undermining noted. There is a medium amount of serosanguineous drainage noted. The wound margin is epibole. There is small (1-33%) pink  granulation within the wound bed. There is a large (67-100%) amount of necrotic tissue within the wound bed including Eschar. Assessment Active Problems ICD-10 Non-pressure chronic ulcer of right ankle with fat layer exposed Disruption of external operation (surgical) wound, not elsewhere classified, subsequent encounter Chronic venous hypertension (idiopathic) with ulcer of right lower extremity Type 2 diabetes mellitus with other skin ulcer Procedures Wound #1 Pre-procedure diagnosis of Wound #1 is a Diabetic Wound/Ulcer of the Lower Extremity located on the Right Achilles .Severity of Tissue Pre Debridement is: Fat layer exposed. There was a Excisional Skin/Subcutaneous Tissue Debridement with a total area of 0.39 sq cm performed by Ricard Dillon., MD. With the following instrument(s): Curette to remove Viable and Non-Viable tissue/material. Material removed includes Subcutaneous Tissue, Slough, and Skin: Epidermis after achieving pain control using Lidocaine 4% Topical Solution. No specimens were taken. A time out was conducted at 10:30, prior to the start of the procedure. A Minimum amount of bleeding was controlled with Pressure. The procedure was tolerated well with a pain level of 2 throughout and a pain level of 0 following the procedure. Post Debridement Measurements: 1.3cm length x 0.3cm width x 0.1cm depth; 0.031cm^3 volume. Character of Wound/Ulcer Post Debridement is improved. Severity of Tissue Post Debridement is: Fat layer exposed. Post procedure Diagnosis Wound #1: Same as Pre-Procedure Plan Follow-up Appointments: Return appointment in 1 month. Dressing Change Frequency: Wound #1 Right Achilles: Change Dressing every other day. Skin Barriers/Peri-Wound Care: Wound #1 Right Achilles: Moisturizing lotion Wound Cleansing: Wound #1 Right Achilles: Clean wound with Normal Saline. - or wound cleanser Primary Wound Dressing: Wound #1 Right Achilles: Hydrofera Blue  - classic moisten with normal saline and cut to fit inside wound margins Secondary Dressing: Wound #1 Right Achilles: Foam Border - or large bandaid. Edema Control: Avoid standing for long periods of time Elevate legs to the level of the heart or above for 30 minutes daily and/or when sitting, a frequency of: - throughout the day Exercise regularly Support Garment 20-30 mm/Hg pressure to: - patient to order compression stockings from [employer]. apply your compression stocking in the morning and remove at night. 1. Hydrofera Blue 2. Border foam 3. We have encouraged her to change the dressing at least  although I understand she is dealing with a lot right now. Fortunately no infection 4.'s we will see her again in a month but we asked her to call us if there is changes. Electronic Signature(s) Signed: 12/22/2019 5:21:33 PM By: Linton Ham MD Entered By: Linton Ham on 12/22/2019 11:01:50 -------------------------------------------------------------------------------- SuperBill Details Patient Name: Date of Service: [name] 12/22/2019 Medical Record [id number] Patient Account Number: [id number] Date of Birth/Sex: Treating RN: [birth year] (64 y.o. Elam Dutch Primary Care Provider: Cathlean Cower Other Clinician: Referring Provider: Treating Provider/Extender:Balraj Brayfield, Arta Bruce, Casper Harrison in Treatment: 7 Diagnosis Coding ICD-10 Codes Code Description [phone] Non-pressure chronic ulcer of right ankle with fat layer exposed Disruption of external operation (surgical) wound, not elsewhere classified, subsequent T81.31XD encounter I87.311 Chronic venous hypertension (idiopathic) with ulcer of right lower extremity E11.622 Type 2 diabetes mellitus with other skin ulcer Facility Procedures CPT4 Code Description: [id number] - DEB SUBQ TISSUE 20 SQ CM/< ICD-10 Diagnosis Description L97.312 Non-pressure chronic ulcer of right ankle with fat  la Modifier: yer exposed Quantity: 1 Physician Procedures CPT4 Code Description: [id number] - WC PHYS SUBQ TISS 20 SQ CM ICD-10 Diagnosis Description [id number] Non-pressure chronic ulcer of right ankle with fat la Modifier: yer exposed Quantity: 1 Electronic Signature(s) Signed: 12/22/2019 5:21:33 PM By: Linton Ham MD Entered By: Linton Ham on 12/22/2019 11:02:01

## 2019-12-26 ENCOUNTER — Telehealth: Payer: Self-pay | Admitting: *Deleted

## 2019-12-26 NOTE — Telephone Encounter (Signed)
This RN spoke with pt per her request for call regarding being informed that the Gemzar is not approved by her insurance- and to receive it would be an out of pocket cost to her of $295.  Note pt received her first dose with her last treatment.  Pt is concerned due pending treatment next week and inquiring a cost she cannot afford.  This RN informed her above would be forwarded to appropriate personnel for possible assistance.  Lilla verbalized appreciation.

## 2020-01-01 ENCOUNTER — Other Ambulatory Visit: Payer: Self-pay | Admitting: *Deleted

## 2020-01-01 ENCOUNTER — Telehealth: Payer: Self-pay | Admitting: *Deleted

## 2020-01-01 DIAGNOSIS — C50112 Malignant neoplasm of central portion of left female breast: Secondary | ICD-10-CM

## 2020-01-01 NOTE — Telephone Encounter (Signed)
This RN returned call to pt per her VM- regarding " plan for tomorrow- since the Gemzar has not been approved by my insurance."  Note this RN sent phone note from last week stating pt is expected to pay $295 per treatment due to medication not authorized to [place] and [facility] as well as MD.  Pt states she has not received a return call since above discussion.  This RN called to pharmacy to follow up and above is being looked into- presently staff is in a meeting. Information given and call will be returned.  This RN called pt ( due to time of 430 pm and her appointment is in the am) and informed of prior communication as well as today- requested for her to come in as scheduled and we will have answers at that time- and before we give her further treatment.  Of note she states she also had Short Term disability paper faxed to the office and wanted to follow up on them.  This RN then received a call back from pharmacy and was informed due to not covered - pt can pay out of pocket - likely get a discount price and can be put on a payment plan.  This note will be given to MD for review and follow up per visit in the am.
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
## 2020-01-02 ENCOUNTER — Inpatient Hospital Stay: Payer: 59

## 2020-01-02 ENCOUNTER — Other Ambulatory Visit: Payer: Self-pay

## 2020-01-02 ENCOUNTER — Inpatient Hospital Stay (HOSPITAL_BASED_OUTPATIENT_CLINIC_OR_DEPARTMENT_OTHER): Payer: 59 | Admitting: Medical

## 2020-01-02 ENCOUNTER — Inpatient Hospital Stay (HOSPITAL_BASED_OUTPATIENT_CLINIC_OR_DEPARTMENT_OTHER): Payer: 59 | Admitting: Oncology

## 2020-01-02 ENCOUNTER — Encounter: Payer: Self-pay | Admitting: *Deleted

## 2020-01-02 ENCOUNTER — Other Ambulatory Visit: Payer: Self-pay | Admitting: Medical

## 2020-01-02 VITALS — BP 133/71 | HR 82 | Temp 98.2°F | Resp 18 | Ht 59.0 in | Wt 235.9 lb

## 2020-01-02 DIAGNOSIS — Z171 Estrogen receptor negative status [ER-]: Secondary | ICD-10-CM

## 2020-01-02 DIAGNOSIS — C50112 Malignant neoplasm of central portion of left female breast: Secondary | ICD-10-CM | POA: Diagnosis not present

## 2020-01-02 DIAGNOSIS — L0231 Cutaneous abscess of buttock: Secondary | ICD-10-CM

## 2020-01-02 DIAGNOSIS — Z6841 Body Mass Index (BMI) 40.0 and over, adult: Secondary | ICD-10-CM

## 2020-01-02 DIAGNOSIS — C50812 Malignant neoplasm of overlapping sites of left female breast: Secondary | ICD-10-CM

## 2020-01-02 DIAGNOSIS — Z5112 Encounter for antineoplastic immunotherapy: Secondary | ICD-10-CM | POA: Diagnosis not present

## 2020-01-02 DIAGNOSIS — C541 Malignant neoplasm of endometrium: Secondary | ICD-10-CM

## 2020-01-02 DIAGNOSIS — Z7189 Other specified counseling: Secondary | ICD-10-CM

## 2020-01-02 DIAGNOSIS — A499 Bacterial infection, unspecified: Secondary | ICD-10-CM

## 2020-01-02 DIAGNOSIS — K760 Fatty (change of) liver, not elsewhere classified: Secondary | ICD-10-CM

## 2020-01-02 LAB — CBC WITH DIFFERENTIAL (CANCER CENTER ONLY)
Abs Immature Granulocytes: 0.06 10*3/uL (ref 0.00–0.07)
Basophils Absolute: 0 10*3/uL (ref 0.0–0.1)
Basophils Relative: 1 %
Eosinophils Absolute: 0 10*3/uL (ref 0.0–0.5)
Eosinophils Relative: 0 %
HCT: 30.2 % — ABNORMAL LOW (ref 36.0–46.0)
Hemoglobin: 9.5 g/dL — ABNORMAL LOW (ref 12.0–15.0)
Immature Granulocytes: 1 %
Lymphocytes Relative: 25 %
Lymphs Abs: 1.6 10*3/uL (ref 0.7–4.0)
MCH: 27.6 pg (ref 26.0–34.0)
MCHC: 31.5 g/dL (ref 30.0–36.0)
MCV: 87.8 fL (ref 80.0–100.0)
Monocytes Absolute: 0.8 10*3/uL (ref 0.1–1.0)
Monocytes Relative: 13 %
Neutro Abs: 3.8 10*3/uL (ref 1.7–7.7)
Neutrophils Relative %: 60 %
Platelet Count: 309 10*3/uL (ref 150–400)
RBC: 3.44 MIL/uL — ABNORMAL LOW (ref 3.87–5.11)
RDW: 16.6 % — ABNORMAL HIGH (ref 11.5–15.5)
WBC Count: 6.4 10*3/uL (ref 4.0–10.5)
nRBC: 0 % (ref 0.0–0.2)

## 2020-01-02 LAB — CMP (CANCER CENTER ONLY)
ALT: 19 U/L (ref 0–44)
AST: 18 U/L (ref 15–41)
Albumin: 3.2 g/dL — ABNORMAL LOW (ref 3.5–5.0)
Alkaline Phosphatase: 59 U/L (ref 38–126)
Anion gap: 11 (ref 5–15)
BUN: 10 mg/dL (ref 8–23)
CO2: 25 mmol/L (ref 22–32)
Calcium: 9 mg/dL (ref 8.9–10.3)
Chloride: 105 mmol/L (ref 98–111)
Creatinine: 0.8 mg/dL (ref 0.44–1.00)
GFR, Est AFR Am: >60 mL/min (ref >60)
GFR, Estimated: >60 mL/min (ref >60)
Glucose, Bld: 118 mg/dL — ABNORMAL HIGH (ref 70–99)
Potassium: 3.5 mmol/L (ref 3.5–5.1)
Sodium: 141 mmol/L (ref 135–145)
Total Bilirubin: 0.2 mg/dL — ABNORMAL LOW (ref 0.3–1.2)
Total Protein: 7.1 g/dL (ref 6.5–8.1)

## 2020-01-02 MED ORDER — TRASTUZUMAB-ANNS CHEMO 150 MG IV SOLR
6.0000 mg/kg | Freq: Once | INTRAVENOUS | Status: AC
  Administered 2020-01-02: 672 mg via INTRAVENOUS
  Filled 2020-01-02: qty 32

## 2020-01-02 MED ORDER — SODIUM CHLORIDE 0.9 % IV SOLN
150.0000 mg | Freq: Once | INTRAVENOUS | Status: AC
  Administered 2020-01-02: 11:00:00 150 mg via INTRAVENOUS
  Filled 2020-01-02: qty 150

## 2020-01-02 MED ORDER — DEXAMETHASONE SODIUM PHOSPHATE 10 MG/ML IJ SOLN: 10.0000 mg | Freq: Once | INTRAMUSCULAR | Status: DC

## 2020-01-02 MED ORDER — SODIUM CHLORIDE 0.9% FLUSH
10.0000 mL | INTRAVENOUS | Status: DC | PRN
  Administered 2020-01-02: 10 mL
  Filled 2020-01-02: qty 10

## 2020-01-02 MED ORDER — PALONOSETRON HCL INJECTION 0.25 MG/5ML
INTRAVENOUS | Status: AC
  Filled 2020-01-02: qty 5

## 2020-01-02 MED ORDER — VANCOMYCIN HCL 125 MG PO CAPS: 125.0000 mg | ORAL_CAPSULE | Freq: Four times a day (QID) | ORAL | 0 refills | Status: DC

## 2020-01-02 MED ORDER — HEPARIN SOD (PORK) LOCK FLUSH 100 UNIT/ML IV SOLN
500.0000 [IU] | Freq: Once | INTRAVENOUS | Status: AC | PRN
  Administered 2020-01-02: 500 [IU]
  Filled 2020-01-02: qty 5

## 2020-01-02 MED ORDER — SODIUM CHLORIDE 0.9 % IV SOLN
Freq: Once | INTRAVENOUS | Status: AC
  Filled 2020-01-02: qty 250

## 2020-01-02 MED ORDER — DEXAMETHASONE SODIUM PHOSPHATE 10 MG/ML IJ SOLN
INTRAMUSCULAR | Status: AC
  Filled 2020-01-02: qty 1

## 2020-01-02 MED ORDER — SODIUM CHLORIDE 0.9 % IV SOLN
800.0000 mg/m2 | Freq: Once | INTRAVENOUS | Status: AC
  Administered 2020-01-02: 14:00:00 1710 mg via INTRAVENOUS
  Filled 2020-01-02: qty 44.97

## 2020-01-02 MED ORDER — ACETAMINOPHEN 325 MG PO TABS
650.0000 mg | ORAL_TABLET | Freq: Once | ORAL | Status: AC
  Administered 2020-01-02: 650 mg via ORAL

## 2020-01-02 MED ORDER — ONDANSETRON HCL 8 MG PO TABS: 8.0000 mg | ORAL_TABLET | Freq: Three times a day (TID) | ORAL | 2 refills | Status: DC | PRN

## 2020-01-02 MED ORDER — DIPHENHYDRAMINE HCL 25 MG PO CAPS
25.0000 mg | ORAL_CAPSULE | Freq: Once | ORAL | Status: AC
  Administered 2020-01-02: 11:00:00 25 mg via ORAL

## 2020-01-02 MED ORDER — DIPHENHYDRAMINE HCL 25 MG PO CAPS
ORAL_CAPSULE | ORAL | Status: AC
  Filled 2020-01-02: qty 1

## 2020-01-02 MED ORDER — BACTROBAN NASAL 2 % NA OINT: 1.0000 "application " | TOPICAL_OINTMENT | Freq: Two times a day (BID) | NASAL | 1 refills | Status: DC

## 2020-01-02 MED ORDER — SODIUM CHLORIDE 0.9 % IV SOLN
750.0000 mg | Freq: Once | INTRAVENOUS | Status: AC
  Administered 2020-01-02: 750 mg via INTRAVENOUS
  Filled 2020-01-02: qty 75

## 2020-01-02 MED ORDER — ACETAMINOPHEN 325 MG PO TABS
ORAL_TABLET | ORAL | Status: AC
  Filled 2020-01-02: qty 2

## 2020-01-02 MED ORDER — PALONOSETRON HCL INJECTION 0.25 MG/5ML
0.2500 mg | Freq: Once | INTRAVENOUS | Status: AC
  Administered 2020-01-02: 0.25 mg via INTRAVENOUS

## 2020-01-02 MED ORDER — VALACYCLOVIR HCL 500 MG PO TABS: 500.0000 mg | ORAL_TABLET | Freq: Two times a day (BID) | ORAL | 5 refills | Status: DC

## 2020-01-02 MED ORDER — SODIUM CHLORIDE 0.9 % IV SOLN
420.0000 mg | Freq: Once | INTRAVENOUS | Status: AC
  Administered 2020-01-02: 420 mg via INTRAVENOUS
  Filled 2020-01-02: qty 14

## 2020-01-02 NOTE — Progress Notes (Signed)
Per Val/Dr. Jana Hakim, patient is to continue gemcitabine with her treatments. [name] is discussing payment/financial options with the patient in infusion today.  Demetrius Charity, PharmD, BCPS, [place] [facility] Pharmacist Pharmacy Phone: [phone] 01/02/2020

## 2020-01-02 NOTE — Progress Notes (Signed)
This patient was seen in the infusion room today.  Initially she was seen for a possible small abscess along the lateral edge of her right chest wall port.  She is currently using topical Bactroban for a small abscess in her nares.  She was told to use Bactroban on the area noted on her port.  The area appeared to be either a small abscess or an area of granulation tissue.  Upon seeing the patient she asked that I would get an abscess on her left medial buttock.  There was an 8 mm x 8 mm open lesion with erythema and induration around the site.  There was no exudate.  The patient was told to use Bactroban on the site also.  Bactroban was refilled for the patient.  Of note she has been treated recently for C. difficile.  She was on Flagyl.  However, she has continued to have diarrhea.  Dr. Jana Hakim has added oral vancomycin to her treatment.  A culture was obtained of the abscess on her left buttock.  Will await these results.  She will proceed with treatment today.  Staff was made aware that it was okay to access her port as the small area of abscess versus granulation tissue was well outside of the area where the port would be accessed.  Sandi Mealy, MHS, PA-C Physician Assistant

## 2020-01-02 NOTE — Progress Notes (Signed)
Brought patient back for port flush and lab draw. While getting area set up patient mentioned that she thinks her port is infected. Assessed area and incision did appear reddened/warm and observed a white pustule/scab in the lefthand corner of incision. Informed patient that labs could be drawn peripherally and then Dr. Jana Hakim could assess area during her provider visit. Lab reception called and updated, and NT escorted patient over to lab waiting area. Desk RN updated on situation.

## 2020-01-02 NOTE — Patient Instructions (Signed)
COVID-19 Vaccine Information can be found at: [email or web address] For questions related to vaccine distribution or appointments, please email [email or web address] or call [phone].   [name] Discharge Instructions for Patients Receiving Immunotherapy and Chemotherapy  Today you received the following immunotherapy and chemotherapy agents: Trastuzumab-anns (Kanjinti), Pertuzumab (Perjeta), Gemcitabine (Gemzar), and Carboplatin (Paraplatin)  To help prevent nausea and vomiting after your treatment, we encourage you to take your nausea medication as directed by your provider.   If you develop nausea and vomiting that is not controlled by your nausea medication, call the clinic.   BELOW ARE SYMPTOMS THAT SHOULD BE REPORTED IMMEDIATELY:  *FEVER GREATER THAN 100.5 F  *CHILLS WITH OR WITHOUT FEVER  NAUSEA AND VOMITING THAT IS NOT CONTROLLED WITH YOUR NAUSEA MEDICATION  *UNUSUAL SHORTNESS OF BREATH  *UNUSUAL BRUISING OR BLEEDING  TENDERNESS IN MOUTH AND THROAT WITH OR WITHOUT PRESENCE OF ULCERS  *URINARY PROBLEMS  *BOWEL PROBLEMS  UNUSUAL RASH Items with * indicate a potential emergency and should be followed up as soon as possible.  Feel free to call the clinic should you have any questions or concerns. The clinic phone number is [phone].  Please show the [facility] at check-in to the Emergency Department and triage nurse.  Coronavirus (COVID-19) Are you at risk?  Are you at risk for the Coronavirus (COVID-19)?  To be considered HIGH RISK for Coronavirus (COVID-19), you have to meet the following criteria:  . Traveled to [place], [place], [place], [place] or [place]; or in the [place] to [place], [place], [place], or [place]; and have fever, cough, and shortness of breath within the last 2 weeks of travel OR . Been in close contact with a person diagnosed with COVID-19 within  the last 2 weeks and have fever, cough, and shortness of breath . IF YOU DO NOT MEET THESE CRITERIA, YOU ARE CONSIDERED LOW RISK FOR COVID-19.  What to do if you are HIGH RISK for COVID-19?  [name] If you are having a medical emergency, call 911. . Seek medical care right away. Before you go to a doctor's office, urgent care or emergency department, call ahead and tell them about your recent travel, contact with someone diagnosed with COVID-19, and your symptoms. You should receive instructions from your physician's office regarding next steps of care.  . When you arrive at healthcare provider, tell the healthcare staff immediately you have returned from visiting [place], [place], [place], [place] or [place]; or traveled in the [place] to [place], [place], [place], or [place]; in the last two weeks or you have been in close contact with a person diagnosed with COVID-19 in the last 2 weeks.   . Tell the health care staff about your symptoms: fever, cough and shortness of breath. . After you have been seen by a medical provider, you will be either: o Tested for (COVID-19) and discharged home on quarantine except to seek medical care if symptoms worsen, and asked to  - Stay home and avoid contact with others until you get your results (4-5 days)  - Avoid travel on public transportation if possible (such as bus, train, or airplane) or o Sent to the Emergency Department by EMS for evaluation, COVID-19 testing, and possible admission depending on your condition and test results.  What to do if you are LOW RISK for COVID-19?  Reduce your risk of any infection by using the same precautions used for avoiding the common cold or flu:  .  Wash your hands often with soap and warm water for at least 20 seconds.  If soap and water are not readily available, use an alcohol-based hand sanitizer with at least 60% alcohol.  . If coughing or sneezing, cover your mouth and nose by coughing or sneezing into the  elbow areas of your shirt or coat, into a tissue or into your sleeve (not your hands). . Avoid shaking hands with others and consider head nods or verbal greetings only. . Avoid touching your eyes, nose, or mouth with unwashed hands.  . Avoid close contact with people who are sick. . Avoid places or events with large numbers of people in one location, like concerts or sporting events. . Carefully consider travel plans you have or are making. . If you are planning any travel outside or inside the [place], visit the CDC's Travelers' Health webpage for the latest health notices. . If you have some symptoms but not all symptoms, continue to monitor at home and seek medical attention if your symptoms worsen. . If you are having a medical emergency, call 911.   [place] / e-Visit: [email or web address]         [facility]: [facility]: [id number]                   [facility]: [phone]

## 2020-01-02 NOTE — Progress Notes (Signed)
Nutrition Follow-up:  Patient with metastatic breast cancer.  Patient receiving chemotherapy.  Noted diagnosis of c-diff and possible port infection.    Met with patient during infusion this am.  Patient with concerns regarding her insurance.    Medications: reviewed  Labs: reviewed  Anthropometrics:   Weight 235 lb 14.4 oz today increased from 230 lb on 3/2   NUTRITION DIAGNOSIS: Unintentional weight loss improved   INTERVENTION:  Discussed foods to consume with diarrhea.   Encouraged patient to speak with MD regarding probiotic tablets during treatment Discussed food probiotics.   Patient can contact RD if needed in the future    MONITORING, EVALUATION, GOAL: Patient will consume adequate calories and protein to maintain muscle mass during treatment   NEXT VISIT: no follow-up, patient to contact RD as needed  Brena Windsor B. Zenia Resides, [place], [place] Registered Dietitian [phone] (pager)

## 2020-01-03 NOTE — Progress Notes (Signed)
These preliminary result these preliminary results were noted.  Awaiting final report.

## 2020-01-04 ENCOUNTER — Inpatient Hospital Stay: Payer: 59

## 2020-01-04 ENCOUNTER — Telehealth: Payer: Self-pay | Admitting: Oncology

## 2020-01-04 ENCOUNTER — Other Ambulatory Visit: Payer: Self-pay

## 2020-01-04 VITALS — BP 111/82 | HR 82 | Temp 98.0°F | Resp 18

## 2020-01-04 DIAGNOSIS — C50812 Malignant neoplasm of overlapping sites of left female breast: Secondary | ICD-10-CM

## 2020-01-04 DIAGNOSIS — C50112 Malignant neoplasm of central portion of left female breast: Secondary | ICD-10-CM

## 2020-01-04 DIAGNOSIS — Z5112 Encounter for antineoplastic immunotherapy: Secondary | ICD-10-CM | POA: Diagnosis not present

## 2020-01-04 DIAGNOSIS — Z171 Estrogen receptor negative status [ER-]: Secondary | ICD-10-CM

## 2020-01-04 LAB — AEROBIC CULTURE W GRAM STAIN (SUPERFICIAL SPECIMEN): Gram Stain: NONE SEEN

## 2020-01-04 MED ORDER — PEGFILGRASTIM INJECTION 6 MG/0.6ML ~~LOC~~
6.0000 mg | PREFILLED_SYRINGE | Freq: Once | SUBCUTANEOUS | Status: AC
  Administered 2020-01-04: 6 mg via SUBCUTANEOUS

## 2020-01-04 MED ORDER — PEGFILGRASTIM INJECTION 6 MG/0.6ML ~~LOC~~
PREFILLED_SYRINGE | SUBCUTANEOUS | Status: AC
  Filled 2020-01-04: qty 0.6

## 2020-01-04 NOTE — Patient Instructions (Signed)

## 2020-01-04 NOTE — Telephone Encounter (Signed)
Rescheduled 3/31 appts per 3/18 staff msg. Pt is aware of new appt date and time.

## 2020-01-04 NOTE — Telephone Encounter (Signed)
Scheduled appts per 3/16 los. Pt confirmed appt date and times.

## 2020-01-08 ENCOUNTER — Encounter: Payer: Self-pay | Admitting: *Deleted

## 2020-01-10 ENCOUNTER — Other Ambulatory Visit: Payer: Self-pay

## 2020-01-10 DIAGNOSIS — Z171 Estrogen receptor negative status [ER-]: Secondary | ICD-10-CM

## 2020-01-10 DIAGNOSIS — C50812 Malignant neoplasm of overlapping sites of left female breast: Secondary | ICD-10-CM
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
## 2020-01-11 ENCOUNTER — Inpatient Hospital Stay (HOSPITAL_BASED_OUTPATIENT_CLINIC_OR_DEPARTMENT_OTHER): Payer: 59 | Admitting: Oncology

## 2020-01-11 ENCOUNTER — Inpatient Hospital Stay: Payer: 59

## 2020-01-11 ENCOUNTER — Other Ambulatory Visit: Payer: Self-pay

## 2020-01-11 VITALS — BP 112/72 | HR 87 | Temp 97.8°F | Resp 18 | Ht 59.0 in | Wt 225.2 lb

## 2020-01-11 DIAGNOSIS — C50812 Malignant neoplasm of overlapping sites of left female breast: Secondary | ICD-10-CM

## 2020-01-11 DIAGNOSIS — Z171 Estrogen receptor negative status [ER-]: Secondary | ICD-10-CM

## 2020-01-11 DIAGNOSIS — C50112 Malignant neoplasm of central portion of left female breast: Secondary | ICD-10-CM

## 2020-01-11 DIAGNOSIS — Z5112 Encounter for antineoplastic immunotherapy: Secondary | ICD-10-CM | POA: Diagnosis not present

## 2020-01-11 LAB — CBC WITH DIFFERENTIAL (CANCER CENTER ONLY)
Abs Immature Granulocytes: 0.15 10*3/uL — ABNORMAL HIGH (ref 0.00–0.07)
Basophils Absolute: 0.1 10*3/uL (ref 0.0–0.1)
Basophils Relative: 1 %
Eosinophils Absolute: 0 10*3/uL (ref 0.0–0.5)
Eosinophils Relative: 0 %
HCT: 31.1 % — ABNORMAL LOW (ref 36.0–46.0)
Hemoglobin: 10.2 g/dL — ABNORMAL LOW (ref 12.0–15.0)
Immature Granulocytes: 1 %
Lymphocytes Relative: 9 %
Lymphs Abs: 1.6 10*3/uL (ref 0.7–4.0)
MCH: 27.7 pg (ref 26.0–34.0)
MCHC: 32.8 g/dL (ref 30.0–36.0)
MCV: 84.5 fL (ref 80.0–100.0)
Monocytes Absolute: 2.3 10*3/uL — ABNORMAL HIGH (ref 0.1–1.0)
Monocytes Relative: 13 %
Neutro Abs: 13.3 10*3/uL — ABNORMAL HIGH (ref 1.7–7.7)
Neutrophils Relative %: 76 %
Platelet Count: 134 10*3/uL — ABNORMAL LOW (ref 150–400)
RBC: 3.68 MIL/uL — ABNORMAL LOW (ref 3.87–5.11)
RDW: 15.8 % — ABNORMAL HIGH (ref 11.5–15.5)
WBC Count: 17.4 10*3/uL — ABNORMAL HIGH (ref 4.0–10.5)
nRBC: 0 % (ref 0.0–0.2)

## 2020-01-11 LAB — CMP (CANCER CENTER ONLY)
ALT: 29 U/L (ref 0–44)
AST: 24 U/L (ref 15–41)
Albumin: 3.8 g/dL (ref 3.5–5.0)
Alkaline Phosphatase: 160 U/L — ABNORMAL HIGH (ref 38–126)
Anion gap: 15 (ref 5–15)
BUN: 20 mg/dL (ref 8–23)
CO2: 20 mmol/L — ABNORMAL LOW (ref 22–32)
Calcium: 9.6 mg/dL (ref 8.9–10.3)
Chloride: 100 mmol/L (ref 98–111)
Creatinine: 1.26 mg/dL — ABNORMAL HIGH (ref 0.44–1.00)
GFR, Est AFR Am: 53 mL/min — ABNORMAL LOW (ref >60)
GFR, Estimated: 46 mL/min — ABNORMAL LOW (ref >60)
Glucose, Bld: 156 mg/dL — ABNORMAL HIGH (ref 70–99)
Potassium: 3.2 mmol/L — ABNORMAL LOW (ref 3.5–5.1)
Sodium: 135 mmol/L (ref 135–145)
Total Bilirubin: 0.3 mg/dL (ref 0.3–1.2)
Total Protein: 8.3 g/dL — ABNORMAL HIGH (ref 6.5–8.1)

## 2020-01-11 MED ORDER — DOXYCYCLINE HYCLATE 100 MG PO TABS: 100.0000 mg | ORAL_TABLET | Freq: Two times a day (BID) | ORAL | 0 refills | Status: DC

## 2020-01-11 MED ORDER — OMEPRAZOLE 40 MG PO CPDR: 40.0000 mg | DELAYED_RELEASE_CAPSULE | Freq: Every day | ORAL | 0 refills | Status: DC

## 2020-01-12 ENCOUNTER — Telehealth: Payer: Self-pay | Admitting: Adult Health

## 2020-01-12 NOTE — Telephone Encounter (Signed)
Scheduled appts per 3/25 los. Pt confirmed new appt dates and times.

## 2020-01-16 ENCOUNTER — Encounter: Payer: Self-pay | Admitting: *Deleted

## 2020-01-17 ENCOUNTER — Ambulatory Visit: Payer: 59 | Admitting: Oncology

## 2020-01-17 ENCOUNTER — Other Ambulatory Visit: Payer: 59

## 2020-01-18 NOTE — Progress Notes (Signed)
.  Pharmacist Chemotherapy Monitoring - Follow Up Assessment    I verify that I have reviewed each item in the below checklist:  . Regimen for the patient is scheduled for the appropriate day and plan matches scheduled date. [name] Appropriate non-routine labs are ordered dependent on drug ordered. . If applicable, additional medications reviewed and ordered per protocol based on lifetime cumulative doses and/or treatment regimen.   Plan for follow-up and/or issues identified: No . I-vent associated with next due treatment: No . MD and/or nursing notified: No  Wynona Neat 01/18/2020 8:35 AM

## 2020-01-19 ENCOUNTER — Encounter (HOSPITAL_BASED_OUTPATIENT_CLINIC_OR_DEPARTMENT_OTHER): Payer: 59 | Admitting: Physician Assistant
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
## 2020-01-24 ENCOUNTER — Inpatient Hospital Stay (HOSPITAL_BASED_OUTPATIENT_CLINIC_OR_DEPARTMENT_OTHER): Payer: 59 | Admitting: Oncology

## 2020-01-24 ENCOUNTER — Other Ambulatory Visit: Payer: Self-pay

## 2020-01-24 ENCOUNTER — Inpatient Hospital Stay (HOSPITAL_BASED_OUTPATIENT_CLINIC_OR_DEPARTMENT_OTHER): Payer: 59 | Admitting: Medical

## 2020-01-24 ENCOUNTER — Inpatient Hospital Stay: Payer: 59

## 2020-01-24 ENCOUNTER — Inpatient Hospital Stay: Payer: 59 | Attending: Oncology

## 2020-01-24 VITALS — BP 140/90 | HR 72 | Temp 98.1°F | Resp 18 | Ht 59.0 in | Wt 224.3 lb

## 2020-01-24 DIAGNOSIS — A0472 Enterocolitis due to Clostridium difficile, not specified as recurrent: Secondary | ICD-10-CM | POA: Diagnosis not present

## 2020-01-24 DIAGNOSIS — G620 Drug-induced polyneuropathy: Secondary | ICD-10-CM | POA: Diagnosis not present

## 2020-01-24 DIAGNOSIS — Z803 Family history of malignant neoplasm of breast: Secondary | ICD-10-CM | POA: Insufficient documentation

## 2020-01-24 DIAGNOSIS — C50112 Malignant neoplasm of central portion of left female breast: Secondary | ICD-10-CM

## 2020-01-24 DIAGNOSIS — C541 Malignant neoplasm of endometrium: Secondary | ICD-10-CM

## 2020-01-24 DIAGNOSIS — C50912 Malignant neoplasm of unspecified site of left female breast: Secondary | ICD-10-CM | POA: Diagnosis present

## 2020-01-24 DIAGNOSIS — Z90722 Acquired absence of ovaries, bilateral: Secondary | ICD-10-CM | POA: Insufficient documentation

## 2020-01-24 DIAGNOSIS — R1013 Epigastric pain: Secondary | ICD-10-CM | POA: Diagnosis not present

## 2020-01-24 DIAGNOSIS — Z5112 Encounter for antineoplastic immunotherapy: Secondary | ICD-10-CM | POA: Diagnosis present

## 2020-01-24 DIAGNOSIS — Z8249 Family history of ischemic heart disease and other diseases of the circulatory system: Secondary | ICD-10-CM | POA: Insufficient documentation

## 2020-01-24 DIAGNOSIS — C773 Secondary and unspecified malignant neoplasm of axilla and upper limb lymph nodes: Secondary | ICD-10-CM | POA: Diagnosis not present

## 2020-01-24 DIAGNOSIS — C50812 Malignant neoplasm of overlapping sites of left female breast: Secondary | ICD-10-CM | POA: Diagnosis not present

## 2020-01-24 DIAGNOSIS — Z5189 Encounter for other specified aftercare: Secondary | ICD-10-CM | POA: Diagnosis not present

## 2020-01-24 DIAGNOSIS — E119 Type 2 diabetes mellitus without complications: Secondary | ICD-10-CM | POA: Insufficient documentation

## 2020-01-24 DIAGNOSIS — Z90710 Acquired absence of both cervix and uterus: Secondary | ICD-10-CM | POA: Insufficient documentation

## 2020-01-24 DIAGNOSIS — Z171 Estrogen receptor negative status [ER-]: Secondary | ICD-10-CM

## 2020-01-24 DIAGNOSIS — Z801 Family history of malignant neoplasm of trachea, bronchus and lung: Secondary | ICD-10-CM | POA: Insufficient documentation

## 2020-01-24 DIAGNOSIS — Z79899 Other long term (current) drug therapy: Secondary | ICD-10-CM | POA: Diagnosis not present

## 2020-01-24 DIAGNOSIS — Z8542 Personal history of malignant neoplasm of other parts of uterus: Secondary | ICD-10-CM | POA: Insufficient documentation

## 2020-01-24 DIAGNOSIS — Z170 Estrogen receptor positive status [ER+]: Secondary | ICD-10-CM | POA: Diagnosis not present

## 2020-01-24 DIAGNOSIS — Z923 Personal history of irradiation: Secondary | ICD-10-CM | POA: Insufficient documentation

## 2020-01-24 DIAGNOSIS — Z95828 Presence of other vascular implants and grafts: Secondary | ICD-10-CM

## 2020-01-24 DIAGNOSIS — Z5111 Encounter for antineoplastic chemotherapy: Secondary | ICD-10-CM | POA: Insufficient documentation

## 2020-01-24 LAB — CBC WITH DIFFERENTIAL (CANCER CENTER ONLY)
Abs Immature Granulocytes: 0.02 10*3/uL (ref 0.00–0.07)
Basophils Absolute: 0 10*3/uL (ref 0.0–0.1)
Basophils Relative: 1 %
Eosinophils Absolute: 0 10*3/uL (ref 0.0–0.5)
Eosinophils Relative: 0 %
HCT: 25.9 % — ABNORMAL LOW (ref 36.0–46.0)
Hemoglobin: 8.3 g/dL — ABNORMAL LOW (ref 12.0–15.0)
Immature Granulocytes: 0 %
Lymphocytes Relative: 24 %
Lymphs Abs: 1.3 10*3/uL (ref 0.7–4.0)
MCH: 28.1 pg (ref 26.0–34.0)
MCHC: 32 g/dL (ref 30.0–36.0)
MCV: 87.8 fL (ref 80.0–100.0)
Monocytes Absolute: 0.7 10*3/uL (ref 0.1–1.0)
Monocytes Relative: 13 %
Neutro Abs: 3.5 10*3/uL (ref 1.7–7.7)
Neutrophils Relative %: 62 %
Platelet Count: 189 10*3/uL (ref 150–400)
RBC: 2.95 MIL/uL — ABNORMAL LOW (ref 3.87–5.11)
RDW: 18.9 % — ABNORMAL HIGH (ref 11.5–15.5)
WBC Count: 5.5 10*3/uL (ref 4.0–10.5)
nRBC: 0 % (ref 0.0–0.2)

## 2020-01-24 LAB — CMP (CANCER CENTER ONLY)
ALT: 25 U/L (ref 0–44)
AST: 23 U/L (ref 15–41)
Albumin: 3.3 g/dL — ABNORMAL LOW (ref 3.5–5.0)
Alkaline Phosphatase: 64 U/L (ref 38–126)
Anion gap: 11 (ref 5–15)
BUN: 21 mg/dL (ref 8–23)
CO2: 21 mmol/L — ABNORMAL LOW (ref 22–32)
Calcium: 9.3 mg/dL (ref 8.9–10.3)
Chloride: 107 mmol/L (ref 98–111)
Creatinine: 0.87 mg/dL (ref 0.44–1.00)
GFR, Est AFR Am: >60 mL/min (ref >60)
GFR, Estimated: >60 mL/min (ref >60)
Glucose, Bld: 115 mg/dL — ABNORMAL HIGH (ref 70–99)
Potassium: 3.6 mmol/L (ref 3.5–5.1)
Sodium: 139 mmol/L (ref 135–145)
Total Bilirubin: 0.4 mg/dL (ref 0.3–1.2)
Total Protein: 8 g/dL (ref 6.5–8.1)

## 2020-01-24 MED ORDER — SODIUM CHLORIDE 0.9 % IV SOLN
150.0000 mg | Freq: Once | INTRAVENOUS | Status: AC
  Administered 2020-01-24: 150 mg via INTRAVENOUS
  Filled 2020-01-24: qty 150

## 2020-01-24 MED ORDER — SODIUM CHLORIDE 0.9% FLUSH
10.0000 mL | INTRAVENOUS | Status: DC | PRN
  Administered 2020-01-24: 10 mL
  Filled 2020-01-24: qty 10

## 2020-01-24 MED ORDER — SODIUM CHLORIDE 0.9 % IV SOLN
420.0000 mg | Freq: Once | INTRAVENOUS | Status: AC
  Administered 2020-01-24: 420 mg via INTRAVENOUS
  Filled 2020-01-24: qty 14

## 2020-01-24 MED ORDER — TRASTUZUMAB-ANNS CHEMO 150 MG IV SOLR
6.0000 mg/kg | Freq: Once | INTRAVENOUS | Status: AC
  Administered 2020-01-24: 672 mg via INTRAVENOUS
  Filled 2020-01-24: qty 32

## 2020-01-24 MED ORDER — SILVER SULFADIAZINE 1 % EX CREA: 1.0000 "application " | TOPICAL_CREAM | Freq: Every day | CUTANEOUS | 0 refills | Status: DC

## 2020-01-24 MED ORDER — ACETAMINOPHEN 325 MG PO TABS
650.0000 mg | ORAL_TABLET | Freq: Once | ORAL | Status: AC
  Administered 2020-01-24: 650 mg via ORAL

## 2020-01-24 MED ORDER — DIPHENHYDRAMINE HCL 25 MG PO CAPS
25.0000 mg | ORAL_CAPSULE | Freq: Once | ORAL | Status: AC
  Administered 2020-01-24: 25 mg via ORAL

## 2020-01-24 MED ORDER — PALONOSETRON HCL INJECTION 0.25 MG/5ML
0.2500 mg | Freq: Once | INTRAVENOUS | Status: AC
  Administered 2020-01-24: 0.25 mg via INTRAVENOUS

## 2020-01-24 MED ORDER — SODIUM CHLORIDE 0.9 % IV SOLN
10.0000 mg | Freq: Once | INTRAVENOUS | Status: AC
  Administered 2020-01-24: 10 mg via INTRAVENOUS
  Filled 2020-01-24: qty 10

## 2020-01-24 MED ORDER — SODIUM CHLORIDE 0.9 % IV SOLN
670.0000 mg | Freq: Once | INTRAVENOUS | Status: AC
  Administered 2020-01-24: 670 mg via INTRAVENOUS
  Filled 2020-01-24: qty 67

## 2020-01-24 MED ORDER — HEPARIN SOD (PORK) LOCK FLUSH 100 UNIT/ML IV SOLN
500.0000 [IU] | Freq: Once | INTRAVENOUS | Status: DC
  Filled 2020-01-24: qty 5

## 2020-01-24 MED ORDER — HEPARIN SOD (PORK) LOCK FLUSH 100 UNIT/ML IV SOLN
500.0000 [IU] | Freq: Once | INTRAVENOUS | Status: AC | PRN
  Administered 2020-01-24: 500 [IU]
  Filled 2020-01-24: qty 5

## 2020-01-24 MED ORDER — SODIUM CHLORIDE 0.9 % IV SOLN
Freq: Once | INTRAVENOUS | Status: DC
  Filled 2020-01-24: qty 250

## 2020-01-24 MED ORDER — ACETAMINOPHEN 325 MG PO TABS
ORAL_TABLET | ORAL | Status: AC
  Filled 2020-01-24: qty 2

## 2020-01-24 MED ORDER — SODIUM CHLORIDE 0.9 % IV SOLN
800.0000 mg/m2 | Freq: Once | INTRAVENOUS | Status: AC
  Administered 2020-01-24: 1710 mg via INTRAVENOUS
  Filled 2020-01-24: qty 44.97

## 2020-01-24 MED ORDER — PALONOSETRON HCL INJECTION 0.25 MG/5ML
INTRAVENOUS | Status: AC
  Filled 2020-01-24: qty 5

## 2020-01-24 MED ORDER — SODIUM CHLORIDE 0.9 % IV SOLN
Freq: Once | INTRAVENOUS | Status: AC
  Filled 2020-01-24: qty 250

## 2020-01-24 MED ORDER — DIPHENHYDRAMINE HCL 25 MG PO CAPS
ORAL_CAPSULE | ORAL | Status: AC
  Filled 2020-01-24: qty 1

## 2020-01-24 MED ORDER — SODIUM CHLORIDE 0.9% FLUSH
10.0000 mL | Freq: Once | INTRAVENOUS | Status: AC
  Administered 2020-01-24: 10 mL
  Filled 2020-01-24: qty 10

## 2020-01-24 NOTE — Patient Instructions (Signed)
[facility] Discharge Instructions for Patients Receiving Chemotherapy  Today you received the following chemotherapy agents Kanjinti, Perjeta, Gemzar, Carboplatin.  To help prevent nausea and vomiting after your treatment, we encourage you to take your nausea medication   If you develop nausea and vomiting that is not controlled by your nausea medication, call the clinic.   BELOW ARE SYMPTOMS THAT SHOULD BE REPORTED IMMEDIATELY:  *FEVER GREATER THAN 100.5 F  *CHILLS WITH OR WITHOUT FEVER  NAUSEA AND VOMITING THAT IS NOT CONTROLLED WITH YOUR NAUSEA MEDICATION  *UNUSUAL SHORTNESS OF BREATH  *UNUSUAL BRUISING OR BLEEDING  TENDERNESS IN MOUTH AND THROAT WITH OR WITHOUT PRESENCE OF ULCERS  *URINARY PROBLEMS  *BOWEL PROBLEMS  UNUSUAL RASH Items with * indicate a potential emergency and should be followed up as soon as possible.  Feel free to call the clinic should you have any questions or concerns. The clinic phone number is [phone].  Please show the [facility] at check-in to the Emergency Department and triage nurse.

## 2020-01-24 NOTE — Progress Notes (Signed)
MD Magrinat would like to reduce dose of Carboplatin since outside of 10% .  Larene Beach, PharmD

## 2020-01-24 NOTE — Progress Notes (Signed)
This patient was seen in infusion. She reported progressive hyperpigmentation of her palmar and dorsal hands. She was told that this was a side effect of the chemotherapy that she was receiving.  Sandi Mealy, MHS, PA-C Physician Assistant

## 2020-01-25 ENCOUNTER — Telehealth: Payer: Self-pay | Admitting: Oncology

## 2020-01-25 NOTE — Telephone Encounter (Signed)
No 4/7 los. No changes made to pt's schedule.

## 2020-01-26 ENCOUNTER — Inpatient Hospital Stay: Payer: 59

## 2020-01-26 ENCOUNTER — Other Ambulatory Visit: Payer: Self-pay

## 2020-01-26 VITALS — BP 140/92 | HR 71 | Temp 97.8°F | Resp 20

## 2020-01-26 DIAGNOSIS — Z5112 Encounter for antineoplastic immunotherapy: Secondary | ICD-10-CM | POA: Diagnosis not present

## 2020-01-26 DIAGNOSIS — C50812 Malignant neoplasm of overlapping sites of left female breast: Secondary | ICD-10-CM

## 2020-01-26 DIAGNOSIS — Z171 Estrogen receptor negative status [ER-]: Secondary | ICD-10-CM

## 2020-01-26 DIAGNOSIS — C50112 Malignant neoplasm of central portion of left female breast: Secondary | ICD-10-CM

## 2020-01-26 MED ORDER — PEGFILGRASTIM INJECTION 6 MG/0.6ML ~~LOC~~
6.0000 mg | PREFILLED_SYRINGE | Freq: Once | SUBCUTANEOUS | Status: AC
  Administered 2020-01-26: 6 mg via SUBCUTANEOUS

## 2020-01-26 MED ORDER — PEGFILGRASTIM INJECTION 6 MG/0.6ML ~~LOC~~
PREFILLED_SYRINGE | SUBCUTANEOUS | Status: AC
  Filled 2020-01-26: qty 0.6

## 2020-01-26 NOTE — Patient Instructions (Signed)

## 2020-01-31 ENCOUNTER — Telehealth: Payer: Self-pay

## 2020-01-31 ENCOUNTER — Other Ambulatory Visit: Payer: Self-pay

## 2020-01-31 ENCOUNTER — Inpatient Hospital Stay: Payer: 59

## 2020-01-31 ENCOUNTER — Encounter: Payer: Self-pay | Admitting: Adult Health

## 2020-01-31 ENCOUNTER — Inpatient Hospital Stay (HOSPITAL_BASED_OUTPATIENT_CLINIC_OR_DEPARTMENT_OTHER): Payer: 59 | Admitting: Adult Health

## 2020-01-31 VITALS — BP 140/88 | HR 79 | Temp 98.2°F | Resp 20 | Ht 59.0 in | Wt 218.8 lb

## 2020-01-31 DIAGNOSIS — Z171 Estrogen receptor negative status [ER-]: Secondary | ICD-10-CM | POA: Diagnosis not present

## 2020-01-31 DIAGNOSIS — C50812 Malignant neoplasm of overlapping sites of left female breast: Secondary | ICD-10-CM

## 2020-01-31 DIAGNOSIS — Z95828 Presence of other vascular implants and grafts: Secondary | ICD-10-CM

## 2020-01-31 DIAGNOSIS — Z5112 Encounter for antineoplastic immunotherapy: Secondary | ICD-10-CM | POA: Diagnosis not present

## 2020-01-31 LAB — CBC WITH DIFFERENTIAL (CANCER CENTER ONLY)
Abs Immature Granulocytes: 0.2 10*3/uL — ABNORMAL HIGH (ref 0.00–0.07)
Basophils Absolute: 0.1 10*3/uL (ref 0.0–0.1)
Basophils Relative: 1 %
Eosinophils Absolute: 0 10*3/uL (ref 0.0–0.5)
Eosinophils Relative: 0 %
HCT: 26.7 % — ABNORMAL LOW (ref 36.0–46.0)
Hemoglobin: 8.7 g/dL — ABNORMAL LOW (ref 12.0–15.0)
Immature Granulocytes: 1 %
Lymphocytes Relative: 9 %
Lymphs Abs: 1.9 10*3/uL (ref 0.7–4.0)
MCH: 29.5 pg (ref 26.0–34.0)
MCHC: 32.6 g/dL (ref 30.0–36.0)
MCV: 90.5 fL (ref 80.0–100.0)
Monocytes Absolute: 2.2 10*3/uL — ABNORMAL HIGH (ref 0.1–1.0)
Monocytes Relative: 10 %
Neutro Abs: 17.7 10*3/uL — ABNORMAL HIGH (ref 1.7–7.7)
Neutrophils Relative %: 79 %
Platelet Count: 104 10*3/uL — ABNORMAL LOW (ref 150–400)
RBC: 2.95 MIL/uL — ABNORMAL LOW (ref 3.87–5.11)
RDW: 18.7 % — ABNORMAL HIGH (ref 11.5–15.5)
WBC Count: 22.1 10*3/uL — ABNORMAL HIGH (ref 4.0–10.5)
nRBC: 0 % (ref 0.0–0.2)

## 2020-01-31 LAB — CMP (CANCER CENTER ONLY)
ALT: 27 U/L (ref 0–44)
AST: 23 U/L (ref 15–41)
Albumin: 3.7 g/dL (ref 3.5–5.0)
Alkaline Phosphatase: 192 U/L — ABNORMAL HIGH (ref 38–126)
Anion gap: 11 (ref 5–15)
BUN: 19 mg/dL (ref 8–23)
CO2: 23 mmol/L (ref 22–32)
Calcium: 9.4 mg/dL (ref 8.9–10.3)
Chloride: 103 mmol/L (ref 98–111)
Creatinine: 1.08 mg/dL — ABNORMAL HIGH (ref 0.44–1.00)
GFR, Est AFR Am: >60 mL/min (ref >60)
GFR, Estimated: 55 mL/min — ABNORMAL LOW (ref >60)
Glucose, Bld: 110 mg/dL — ABNORMAL HIGH (ref 70–99)
Potassium: 3.5 mmol/L (ref 3.5–5.1)
Sodium: 137 mmol/L (ref 135–145)
Total Bilirubin: 0.3 mg/dL (ref 0.3–1.2)
Total Protein: 8.5 g/dL — ABNORMAL HIGH (ref 6.5–8.1)

## 2020-01-31 MED ORDER — HEPARIN SOD (PORK) LOCK FLUSH 100 UNIT/ML IV SOLN
500.0000 [IU] | Freq: Once | INTRAVENOUS | Status: AC
  Administered 2020-01-31: 500 [IU]
  Filled 2020-01-31: qty 5

## 2020-01-31 MED ORDER — FAMOTIDINE IN NACL 20-0.9 MG/50ML-% IV SOLN
INTRAVENOUS | Status: AC
  Filled 2020-01-31: qty 50

## 2020-01-31 MED ORDER — SODIUM CHLORIDE 0.9% FLUSH
10.0000 mL | Freq: Once | INTRAVENOUS | Status: AC
  Administered 2020-01-31: 10 mL
  Filled 2020-01-31: qty 10

## 2020-01-31 MED ORDER — SODIUM CHLORIDE 0.9 % IV SOLN
Freq: Once | INTRAVENOUS | Status: AC
  Filled 2020-01-31: qty 250

## 2020-01-31 MED ORDER — FAMOTIDINE IN NACL 20-0.9 MG/50ML-% IV SOLN
20.0000 mg | Freq: Once | INTRAVENOUS | Status: AC
  Administered 2020-01-31: 20 mg via INTRAVENOUS

## 2020-01-31 NOTE — Telephone Encounter (Signed)
PROGRESS NOTE: Per Wilber Bihari NP sent schedule message to get patient scheduled for IV FLUIDS tomorrow in infusion. Message sent

## 2020-01-31 NOTE — Patient Instructions (Signed)
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
## 2020-02-01 ENCOUNTER — Telehealth: Payer: Self-pay | Admitting: Adult Health

## 2020-02-01 ENCOUNTER — Other Ambulatory Visit: Payer: Self-pay

## 2020-02-01 ENCOUNTER — Inpatient Hospital Stay: Payer: 59

## 2020-02-01 VITALS — BP 129/77 | HR 79 | Temp 98.7°F | Resp 18

## 2020-02-01 DIAGNOSIS — Z5112 Encounter for antineoplastic immunotherapy: Secondary | ICD-10-CM | POA: Diagnosis not present

## 2020-02-01 DIAGNOSIS — C50812 Malignant neoplasm of overlapping sites of left female breast: Secondary | ICD-10-CM

## 2020-02-01 DIAGNOSIS — Z171 Estrogen receptor negative status [ER-]: Secondary | ICD-10-CM

## 2020-02-01 DIAGNOSIS — Z95828 Presence of other vascular implants and grafts: Secondary | ICD-10-CM

## 2020-02-01 MED ORDER — SODIUM CHLORIDE 0.9% FLUSH
10.0000 mL | Freq: Once | INTRAVENOUS | Status: AC
  Administered 2020-02-01: 10 mL
  Filled 2020-02-01: qty 10

## 2020-02-01 MED ORDER — SODIUM CHLORIDE 0.9 % IV SOLN
INTRAVENOUS | Status: DC
  Filled 2020-02-01 (×2): qty 250

## 2020-02-01 MED ORDER — HEPARIN SOD (PORK) LOCK FLUSH 100 UNIT/ML IV SOLN
500.0000 [IU] | Freq: Once | INTRAVENOUS | Status: AC
  Administered 2020-02-01: 500 [IU]
  Filled 2020-02-01: qty 5

## 2020-02-01 NOTE — Patient Instructions (Signed)

## 2020-02-01 NOTE — Telephone Encounter (Signed)
No 4/14 los. No changes made to pt's schedule.  

## 2020-02-02 ENCOUNTER — Encounter (HOSPITAL_BASED_OUTPATIENT_CLINIC_OR_DEPARTMENT_OTHER): Payer: 59 | Attending: Internal Medicine | Admitting: Internal Medicine

## 2020-02-02 DIAGNOSIS — Z8542 Personal history of malignant neoplasm of other parts of uterus: Secondary | ICD-10-CM | POA: Insufficient documentation

## 2020-02-02 DIAGNOSIS — Z09 Encounter for follow-up examination after completed treatment for conditions other than malignant neoplasm: Secondary | ICD-10-CM | POA: Diagnosis not present

## 2020-02-02 DIAGNOSIS — Z96653 Presence of artificial knee joint, bilateral: Secondary | ICD-10-CM | POA: Insufficient documentation

## 2020-02-02 DIAGNOSIS — L97312 Non-pressure chronic ulcer of right ankle with fat layer exposed: Secondary | ICD-10-CM | POA: Insufficient documentation

## 2020-02-02 DIAGNOSIS — I87311 Chronic venous hypertension (idiopathic) with ulcer of right lower extremity: Secondary | ICD-10-CM | POA: Diagnosis not present

## 2020-02-02 DIAGNOSIS — C50912 Malignant neoplasm of unspecified site of left female breast: Secondary | ICD-10-CM | POA: Insufficient documentation

## 2020-02-02 DIAGNOSIS — E11621 Type 2 diabetes mellitus with foot ulcer: Secondary | ICD-10-CM | POA: Diagnosis not present

## 2020-02-02 DIAGNOSIS — E11622 Type 2 diabetes mellitus with other skin ulcer: Secondary | ICD-10-CM | POA: Diagnosis present

## 2020-02-02 NOTE — Progress Notes (Addendum)
BROOKLEIGH, CREATH ([id number]) Visit Report for 02/02/2020 Arrival Information Details Patient Name: Date of Service: [place] [name] 02/02/2020 10:15 A M Medical Record Number: [id number] Patient Account Number: [id number] Date of Birth/Sex: Treating RN: [birth year] (64 y.o. Nancy Fetter Primary Care Mizael Sagar: Cathlean Cower Other Clinician: Referring Kahlin Mark: Treating Ceferino Lang/Extender: Jeri Modena in Treatment: 58 Visit Information History Since Last Visit Added or deleted any medications: No Patient Arrived: Ambulatory Any new allergies or adverse reactions: No Arrival Time: 10:22 Had a fall or experienced change in No Accompanied By: alone activities of daily living that may affect Transfer Assistance: None risk of falls: Patient Identification Verified: Yes Signs or symptoms of abuse/neglect since last visito No Secondary Verification Process Completed: Yes Hospitalized since last visit: No Patient Requires Transmission-Based Precautions: No Implantable device outside of the clinic excluding No Patient Has Alerts: No cellular tissue based products placed in the center since last visit: Has Dressing in Place as Prescribed: No Pain Present Now: No Electronic Signature(s) Signed: 02/02/2020 4:22:27 PM By: Levan Hurst RN, BSN Entered By: Levan Hurst on 02/02/2020 10:25:14 -------------------------------------------------------------------------------- Clinic Level of Care Assessment Details Patient Name: Date of Service: [facility] [name] 02/02/2020 10:15 A M Medical Record Number: [id number] Patient Account Number: [id number] Date of Birth/Sex: Treating RN: Oct 22, 1955 (64 y.o. Clearnce Sorrel Primary Care Quasim Doyon: Cathlean Cower Other Clinician: Referring Dakayla Disanti: Treating Ameyah Bangura/Extender: Jeri Modena in Treatment: 13 Clinic Level of Care Assessment Items TOOL 4 Quantity Score X- 1 0 Use  when only an EandM is performed on FOLLOW-UP visit ASSESSMENTS - Nursing Assessment / Reassessment X- 1 10 Reassessment of Co-morbidities (includes updates in patient status) X- 1 5 Reassessment of Adherence to Treatment Plan ASSESSMENTS - Wound and Skin A ssessment / Reassessment X - Simple Wound Assessment / Reassessment - one wound 1 5 []  - 0 Complex Wound Assessment / Reassessment - multiple wounds []  - 0 Dermatologic / Skin Assessment (not related to wound area) ASSESSMENTS - Focused Assessment X- 1 5 Circumferential Edema Measurements - multi extremities []  - 0 Nutritional Assessment / Counseling / Intervention []  - 0 Lower Extremity Assessment (monofilament, tuning fork, pulses) []  - 0 Peripheral Arterial Disease Assessment (using hand held doppler) ASSESSMENTS - Ostomy and/or Continence Assessment and Care []  - 0 Incontinence Assessment and Management []  - 0 Ostomy Care Assessment and Management (repouching, etc.) PROCESS - Coordination of Care X - Simple Patient / Family Education for ongoing care 1 15 []  - 0 Complex (extensive) Patient / Family Education for ongoing care X- 1 10 Staff obtains Programmer, systems, Records, T Results / Process Orders est []  - 0 Staff telephones HHA, Nursing Homes / Clarify orders / etc []  - 0 Routine Transfer to another Facility (non-emergent condition) []  - 0 Routine Hospital Admission (non-emergent condition) []  - 0 New Admissions / Biomedical engineer / Ordering NPWT Apligraf, etc. , []  - 0 Emergency Hospital Admission (emergent condition) X- 1 10 Simple Discharge Coordination []  - 0 Complex (extensive) Discharge Coordination PROCESS - Special Needs []  - 0 Pediatric / Minor Patient Management []  - 0 Isolation Patient Management []  - 0 Hearing / Language / Visual special needs []  - 0 Assessment of Community assistance (transportation, D/C planning, etc.) []  - 0 Additional assistance / Altered mentation []  - 0 Support  Surface(s) Assessment (bed, cushion, seat, etc.) INTERVENTIONS - Wound Cleansing / Measurement X - Simple Wound Cleansing - one wound 1 5 []  - 0  Complex Wound Cleansing - multiple wounds X- 1 5 Wound Imaging (photographs - any number of wounds) []  - 0 Wound Tracing (instead of photographs) X- 1 5 Simple Wound Measurement - one wound []  - 0 Complex Wound Measurement - multiple wounds INTERVENTIONS - Wound Dressings []  - 0 Small Wound Dressing one or multiple wounds []  - 0 Medium Wound Dressing one or multiple wounds []  - 0 Large Wound Dressing one or multiple wounds []  - 0 Application of Medications - topical []  - 0 Application of Medications - injection INTERVENTIONS - Miscellaneous []  - 0 External ear exam []  - 0 Specimen Collection (cultures, biopsies, blood, body fluids, etc.) []  - 0 Specimen(s) / Culture(s) sent or taken to Lab for analysis []  - 0 Patient Transfer (multiple staff / Civil Service fast streamer / Similar devices) []  - 0 Simple Staple / Suture removal (25 or less) []  - 0 Complex Staple / Suture removal (26 or more) []  - 0 Hypo / Hyperglycemic Management (close monitor of Blood Glucose) []  - 0 Ankle / Brachial Index (ABI) - do not check if billed separately X- 1 5 Vital Signs Has the patient been seen at the hospital within the last three years: Yes Total Score: 80 Level Of Care: New/Established - Level 3 Electronic Signature(s) Signed: 02/02/2020 4:21:34 PM By: Kela Millin Entered By: Kela Millin on 02/02/2020 10:45:54 -------------------------------------------------------------------------------- Lower Extremity Assessment Details Patient Name: Date of Service: [name] 02/02/2020 10:15 A M Medical Record Number: [id number] Patient Account Number: [id number] Date of Birth/Sex: Treating RN: 1955/11/09 (64 y.o. Nancy Fetter Primary Care Kanae Ignatowski: Cathlean Cower Other Clinician: Referring Francie Keeling: Treating Tylor Courtwright/Extender:  Jeri Modena in Treatment: 13 Edema Assessment Assessed: Shirlyn Goltz: No] Patrice Paradise: No] Edema: [Left: Ye] [Right: s] Calf Left: Right: Point of Measurement: cm From Medial Instep cm 38 cm Ankle Left: Right: Point of Measurement: cm From Medial Instep cm 24.5 cm Vascular Assessment Pulses: Dorsalis Pedis Palpable: [Right:Yes] Electronic Signature(s) Signed: 02/02/2020 4:22:27 PM By: Levan Hurst RN, BSN Entered By: Levan Hurst on 02/02/2020 10:27:58 -------------------------------------------------------------------------------- Multi Wound Chart Details Patient Name: Date of Service: [name] 02/02/2020 10:15 A M Medical Record Number: [id number] Patient Account Number: [id number] Date of Birth/Sex: Treating RN: [birth year] (64 y.o. Clearnce Sorrel Primary Care Sharmayne Jablon: Cathlean Cower Other Clinician: Referring Izzabell Klasen: Treating Adara Kittle/Extender: Jeri Modena in Treatment: 13 Vital Signs Height(in): 59 Pulse(bpm): 83 Weight(lbs): 250 Blood Pressure(mmHg): 158/108 Body Mass Index(BMI): 50 Temperature(F): 98.4 Respiratory Rate(breaths/min): 18 Photos: [1:No Photos Right Achilles] [N/A:N/A N/A] Wound Location: [1:Trauma] [N/A:N/A] Wounding Event: [1:Diabetic Wound/Ulcer of the Lower] [N/A:N/A] Primary Etiology: [1:Extremity Anemia, Lymphedema, Sleep Apnea,] [N/A:N/A] Comorbid History: [1:Hypertension, Type II Diabetes, Osteoarthritis, Received Radiation 10/16/2019] [N/A:N/A] Date Acquired: [1:13] [N/A:N/A] Weeks of Treatment: [1:Healed - Epithelialized] [N/A:N/A] Wound Status: [1:0x0x0] [N/A:N/A] Measurements L x W x D (cm) [1:0] [N/A:N/A] A (cm) : rea [1:0] [N/A:N/A] Volume (cm) : [1:100.00%] [N/A:N/A] % Reduction in A rea: [1:100.00%] [N/A:N/A] % Reduction in Volume: [1:Grade 1] [N/A:N/A] Classification: [1:None Present] [N/A:N/A] Exudate A mount: [1:Epibole] [N/A:N/A] Wound Margin: [1:None Present (0%)]  [N/A:N/A] Granulation A mount: [1:None Present (0%)] [N/A:N/A] Necrotic A mount: [1:Fascia: No] [N/A:N/A] Exposed Structures: [1:Fat Layer (Subcutaneous Tissue): No Tendon: No Muscle: No Joint: No Bone: No Large (67-100%)] [N/A:N/A] Treatment Notes Electronic Signature(s) Signed: 02/12/2020 5:40:04 PM By: Linton Ham MD Signed: 07/08/2020 1:36:19 PM By: Kela Millin Entered By: Linton Ham on 02/12/2020 09:34:08 -------------------------------------------------------------------------------- Multi-Disciplinary Care Plan Details Patient Name:  Date of Service: Gonzella Lex 02/02/2020 10:15 A M Medical Record Number: [id number] Patient Account Number: [id number] Date of Birth/Sex: Treating RN: 1956-04-19 (64 y.o. Clearnce Sorrel Primary Care Laurrie Toppin: Cathlean Cower Other Clinician: Referring Skyleigh Windle: Treating Sonoma Firkus/Extender: Jeri Modena in Treatment: 13 Active Inactive Electronic Signature(s) Signed: 02/02/2020 4:21:34 PM By: Kela Millin Entered By: Kela Millin on 02/02/2020 10:40:48 -------------------------------------------------------------------------------- Pain Assessment Details Patient Name: Date of Service: [name] 02/02/2020 10:15 A M Medical Record Number: [id number] Patient Account Number: [id number] Date of Birth/Sex: Treating RN: [birth year] (64 y.o. Nancy Fetter Primary Care Claudene Gatliff: Cathlean Cower Other Clinician: Referring Stasha Naraine: Treating Dasani Thurlow/Extender: Jeri Modena in Treatment: 13 Active Problems Location of Pain Severity and Description of Pain Patient Has Paino No Site Locations Pain Management and Medication Current Pain Management: Electronic Signature(s) Signed: 02/02/2020 4:22:27 PM By: Levan Hurst RN, BSN Entered By: Levan Hurst on 02/02/2020  10:24:56 -------------------------------------------------------------------------------- Wound Assessment Details Patient Name: Date of Service: [name] 02/02/2020 10:15 A M Medical Record Number: [id number] Patient Account Number: [id number] Date of Birth/Sex: Treating RN: [birth year] (64 y.o. Nancy Fetter Primary Care Hannelore Bova: Cathlean Cower Other Clinician: Referring Majesti Gambrell: Treating Othel Hoogendoorn/Extender: Jeri Modena in Treatment: 13 Wound Status Wound Number: 1 Primary Diabetic Wound/Ulcer of the Lower Extremity Etiology: Wound Location: Right Achilles Wound Healed - Epithelialized Wounding Event: Trauma Status: Date Acquired: 10/16/2019 Comorbid Anemia, Lymphedema, Sleep Apnea, Hypertension, Type II Weeks Of Treatment: 13 History: Diabetes, Osteoarthritis, Received Radiation Clustered Wound: No Wound Measurements Length: (cm) Width: (cm) Depth: (cm) Area: (cm) Volume: (cm) 0 % Reduction in Area: 100% 0 % Reduction in Volume: 100% 0 Epithelialization: Large (67-100%) 0 Tunneling: No 0 Undermining: No Wound Description Classification: Grade 1 Wound Margin: Epibole Exudate Amount: None Present Wound Bed Granulation Amount: None Present (0%) Necrotic Amount: None Present (0%) Foul Odor After Cleansing: No Slough/Fibrino No Exposed Structure Fascia Exposed: No Fat Layer (Subcutaneous Tissue) Exposed: No Tendon Exposed: No Muscle Exposed: No Joint Exposed: No Bone Exposed: No Electronic Signature(s) Signed: 02/02/2020 4:22:27 PM By: Levan Hurst RN, BSN Entered By: Levan Hurst on 02/02/2020 10:29:14 -------------------------------------------------------------------------------- Vitals Details Patient Name: Date of Service: [name] 02/02/2020 10:15 A M Medical Record Number: [id number] Patient Account Number: [id number] Date of Birth/Sex: Treating RN: Jan 01, 1956 (64 y.o. Nancy Fetter Primary Care Ryin Ambrosius: Cathlean Cower Other Clinician: Referring Jaskiran Pata: Treating Amesha [name]/Extender: Jeri Modena in Treatment: 13 Vital Signs Time Taken: 10:24 Temperature (F): 98.4 Height (in): 59 Pulse (bpm): 83 Weight (lbs): 250 Respiratory Rate (breaths/min): 18 Body Mass Index (BMI): 50.5 Blood Pressure (mmHg): 158/108 Reference Range: 80 - 120 mg / dl Electronic Signature(s) Signed: 02/02/2020 4:22:27 PM By: Levan Hurst RN, BSN Entered By: Levan Hurst on 02/02/2020 10:24:51

## 2020-02-03 NOTE — Progress Notes (Addendum)
DONNETTE, SILVERIO ([id number]) Visit Report for 02/02/2020 HPI Details Patient Name: [name], [name]. Date of Service: 02/02/2020 10:15 AM Medical Record [id number] Patient Account Number: [id number] Date of Birth/Sex: Treating RN: 19-Jun-1956 (64 y.o. Clearnce Sorrel Primary Care Provider: Cathlean Cower Other Clinician: Referring Provider: Treating Provider/Extender:Margy Sumler, Arta Bruce, Casper Harrison in Treatment: 13 History of Present Illness HPI Description: 11/02/2019 ADMISSION This is a 64 year old woman who has a wound on the right posterior Achilles area towards the distal end of the Achilles tendon surgery scars. She tells me that roughly 2-1/2 to 3 weeks ago she was picking at some dry skin in the area pulled it off and that is how the wound opened up. She had an original spontaneous Achilles tendon rupture in the early 1990s that was repaired and then very shortly thereafter it reruptured and she required another surgery. She has not had any problems with this since then. The wound itself currently has episodic sharp pain some swelling. She was put on Augmentin by her primary physician on 10/30/2019 for possible coexistent cellulitis. She used hydrogen peroxide for a while with topical antibiotics. Mostly she has been using topical antibiotics as somebody told her that hydrogen peroxide was not good to use. 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 Surgery was in [place], bilateral total knee replacements. ABIs in our clinic on the right were 1.09 1/21; patient here with a linear wound in the right Achilles area. Her wrap fell down she did not call us to replace it. She has questions  about her compression wrap necessity. Finally she asked if the wound could be sutured The patient is under a lot of pressure with initiation of chemotherapy she is going to have a port placed etc. 1/28; linear wound in the right Achilles area in the setting of previous surgery. She is going for a port for chemotherapy for her breast cancer. She has apparently made herself an appointment with orthopedic surgery. I am not sure who she has made an appointment with at this point. 2/12 linear wound in the right Achilles is come in in terms of length. This is in the setting of previous surgery in this area The patient is undergoing chemotherapy for breast cancer and having significant side effects [diarrhea] I am trying not to bring in her in too often as long as her wound is contracted 3/5; linear wound in the right Achilles. Not quite as long however still some measurable depth. She tells [place] that she has had problems with chemotherapy also apparently pseudomembranous colitis. She has not had much attention to paid to the wound. Came in without any dressing on etc. 4/16; right Achilles. She is completely closed Electronic Signature(s) Signed: 02/12/2020 5:40:04 PM By: Linton Ham MD Entered By: Linton Ham on 02/12/2020 09:34:54 -------------------------------------------------------------------------------- Physical Exam Details Patient Name: [name] Date of Service: 02/02/2020 10:15 AM Medical Record [id number] Patient Account Number: [id number] Date of Birth/Sex: Treating RN: 01/31/1956 (64 y.o. Clearnce Sorrel Primary Care Provider: Cathlean Cower Other Clinician: Referring Provider: Treating Provider/Extender:Tilmon Wisehart, Arta Bruce, Casper Harrison in Treatment: 13 Notes Wound exam; linear wound in the lower part of the Achilles tendon surgical scar. This is completely epithelialized today. No surrounding infection. Electronic Signature(s)  Signed: 02/12/2020  5:40:04 PM By: Linton Ham MD Entered By: Linton Ham on 02/12/2020 09:35:31 -------------------------------------------------------------------------------- Physician Orders Details Patient Name: [name] Date of Service: 02/02/2020 10:15 AM Medical Record [id number] Patient Account Number: [id number] Date of Birth/Sex: Treating RN: 09-18-1956 (64 y.o. Clearnce Sorrel Primary Care Provider: Cathlean Cower Other Clinician: Referring Provider: Treating Provider/Extender:Ege Muckey, Arta Bruce, Casper Harrison in Treatment: 13 Verbal / Phone Orders: No Diagnosis Coding [facility]charge From [facility] Discharge from [place] - call if wound re-opens Notes protect area to keep from re-opening Electronic Signature(s) Signed: 02/02/2020 4:21:34 PM By: Kela Millin Signed: 02/03/2020 6:55:41 AM By: Linton Ham MD Entered By: Kela Millin on 02/02/2020 10:46:35 -------------------------------------------------------------------------------- Problem List Details Patient Name: [name] Date of Service: 02/02/2020 10:15 AM Medical Record [id number] Patient Account Number: [id number] Date of Birth/Sex: Treating RN: 10/03/1956 (64 y.o. 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/02/2019 No Yes exposed T81.31XD Disruption of external operation (surgical) wound, not 11/02/2019 No Yes elsewhere classified, subsequent encounter I87.311 Chronic venous hypertension (idiopathic) with ulcer of 11/02/2019 No Yes right lower extremity E11.622 Type 2 diabetes mellitus with other skin ulcer 11/02/2019 No Yes Inactive Problems Resolved Problems Electronic  Signature(s) Signed: 02/12/2020 5:40:04 PM By: Linton Ham MD Entered By: Linton Ham on 02/12/2020 09:33:35 -------------------------------------------------------------------------------- Progress Note Details Patient Name: [name] Date of Service: 02/02/2020 10:15 AM Medical Record [id number] Patient Account Number: [id number] Date of Birth/Sex: Treating RN: [birth year] (64 y.o. Clearnce Sorrel Primary Care Provider: Cathlean Cower Other Clinician: Referring Provider: Treating Provider/Extender:Taiya Nutting, Arta Bruce, Casper Harrison in Treatment: 13 Subjective History of Present Illness (HPI) 11/02/2019 ADMISSION This is a 64 year old woman who has a wound on the right posterior Achilles area towards the distal end of the Achilles tendon surgery scars. She tells me that roughly 2-1/2 to 3 weeks ago she was picking at some dry skin in the area pulled it off and that is how the wound opened up. She had an original spontaneous Achilles tendon rupture in the early 1990s that was repaired and then very shortly thereafter it reruptured and she required another surgery. She has not had any problems with this since then. The wound itself currently has episodic sharp pain some swelling. She was put on Augmentin by her primary physician on 10/30/2019 for possible coexistent cellulitis. She used hydrogen peroxide for a while with topical antibiotics. Mostly she has been using topical antibiotics as somebody told her that hydrogen peroxide was not good to use. 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 Surgery was in [place], bilateral total knee replacements. ABIs in our clinic on the right were  1.09 1/21; patient here with a linear wound in the right Achilles area. Her wrap fell down she did not call us to replace it. She has questions about her compression wrap necessity. Finally she asked if the wound could be sutured The patient is under a lot of pressure with initiation of chemotherapy she is going to have a port placed etc. 1/28; linear wound in  the right Achilles area in the setting of previous surgery. She is going for a port for chemotherapy for her breast cancer. She has apparently made herself an appointment with orthopedic surgery. I am not sure who she has made an appointment with at this point. 2/12 linear wound in the right Achilles is come in in terms of length. This is in the setting of previous surgery in this area The patient is undergoing chemotherapy for breast cancer and having significant side effects [diarrhea] I am trying not to bring in her in too often as long as her wound is contracted 3/5; linear wound in the right Achilles. Not quite as long however still some measurable depth. She tells [place] that she has had problems with chemotherapy also apparently pseudomembranous colitis. She has not had much attention to paid to the wound. Came in without any dressing on etc. 4/16; right Achilles. She is completely closed Objective Constitutional Vitals Time Taken: 10:24 AM, Height: 59 in, Weight: 250 lbs, BMI: 50.5, Temperature: 98.4 F, Pulse: 83 bpm, Respiratory Rate: 18 breaths/min, Blood Pressure: 158/108 mmHg. Integumentary (Hair, Skin) Wound #1 status is Healed - Epithelialized. Original cause of wound was Trauma. The wound is located on the Right Achilles. The wound measures 0cm length x 0cm width x 0cm depth; 0cm^2 area and 0cm^3 volume. There is no tunneling or undermining noted. There is a none present amount of drainage noted. The wound margin is epibole. There is no granulation within the wound bed. There is no necrotic tissue within the wound  bed. Assessment Active Problems ICD-10 Non-pressure chronic ulcer of right ankle with fat layer exposed Disruption of external operation (surgical) wound, not elsewhere classified, subsequent encounter Chronic venous hypertension (idiopathic) with ulcer of right lower extremity Type 2 diabetes mellitus with other skin ulcer Plan Discharge From [facility] Services: Discharge from [place] - call if wound re-opens General Notes: protect area to keep from re-opening 1. The patient is completely closed 2. She can be discharged from the wound care center. She is to call if she has further difficulties in this area Electronic Signature(s) Signed: 02/12/2020 5:40:04 PM By: Linton Ham MD Entered By: Linton Ham on 02/12/2020 09:36:03 -------------------------------------------------------------------------------- SuperBill Details Patient Name: Date of Service: [name] 02/02/2020 Medical Record [id number] Patient Account Number: [id number] Date of Birth/Sex: Treating RN: 14-Sep-1956 (64 y.o. Clearnce Sorrel Primary Care Provider: Cathlean Cower Other Clinician: Referring Provider: Treating Provider/Extender:Tayte Childers, Arta Bruce, Casper Harrison in Treatment: 13 Diagnosis Coding ICD-10 Codes Code Description [phone] Non-pressure chronic ulcer of right ankle with fat layer exposed T81.31XD Disruption of external operation (surgical) wound, not elsewhere classified, subsequent encounter I87.311 Chronic venous hypertension (idiopathic) with ulcer of right lower extremity E11.622 Type 2 diabetes mellitus with other skin ulcer Facility Procedures CPT4 Code: [id number] Description: 99213 - WOUND CARE VISIT-LEV 3 EST PT Modifier: Quantity: 1 Physician Procedures CPT4 CodeDescription: [id number] - WC PHYS LEVEL 2 - EST PT ICD-10 Diagnosis Description [id number] Non-pressure chronic ulcer of right ankle with fat layer exp T81.31XD Disruption of external operation  (surgical) wound, not elsew subsequent encounter Modifier: osed here classified, Quantity: 1 Electronic Signature(s) Signed: 02/12/2020 5:40:04 PM By: Linton Ham MD Previous Signature: 02/02/2020 4:21:34 PM Version By: Kela Millin Previous Signature: 02/03/2020 6:55:41 AM Version By: Linton Ham MD Entered By: Linton Ham on 02/12/2020 09:36:21

## 2020-02-07 NOTE — Progress Notes (Signed)
Pharmacist Chemotherapy Monitoring - Follow Up Assessment    I verify that I have reviewed each item in the below checklist:  . Regimen for the patient is scheduled for the appropriate day and plan matches scheduled date. [name] Appropriate non-routine labs are ordered dependent on drug ordered. . If applicable, additional medications reviewed and ordered per protocol based on lifetime cumulative doses and/or treatment regimen.   Plan for follow-up and/or issues identified: No . I-vent associated with next due treatment: No . MD and/or nursing notified: No  [name] D 02/07/2020 3:53 PM

## 2020-02-09 ENCOUNTER — Ambulatory Visit (HOSPITAL_COMMUNITY): Payer: 59 | Attending: Cardiovascular Disease

## 2020-02-09 ENCOUNTER — Other Ambulatory Visit: Payer: Self-pay | Admitting: Oncology

## 2020-02-09 ENCOUNTER — Other Ambulatory Visit: Payer: Self-pay

## 2020-02-09 DIAGNOSIS — C50812 Malignant neoplasm of overlapping sites of left female breast: Secondary | ICD-10-CM | POA: Diagnosis present

## 2020-02-09 DIAGNOSIS — C50112 Malignant neoplasm of central portion of left female breast: Secondary | ICD-10-CM | POA: Diagnosis not present

## 2020-02-09 DIAGNOSIS — Z171 Estrogen receptor negative status [ER-]: Secondary | ICD-10-CM | POA: Diagnosis present

## 2020-02-12 ENCOUNTER — Other Ambulatory Visit: Payer: Self-pay | Admitting: Oncology

## 2020-02-12 NOTE — Progress Notes (Signed)
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 anti-HER-2 treatment and proceed to surgery.  What that surgery should be will be up to the patient and her surgeon.  Original breast MRI 11/28/2019 showed extensive changes in the left breast, making the possibility of lumpectomy doubtful at best.  There were also at least 5 suspicious lymph nodes noted at the time.  The patient still has palpable right axillary adenopathy.  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    I have set Baley up for a repeat breast MRI and I have contacted her surgeon Dr. [name] so that we can move this process along.  She had her fifth cycle of chemotherapy, only carboplatin at an AUC of 2 and Gemzar, so she will not need Neulasta day 3, which is a major source of symptomatology  for her.  We have gone ahead and scheduled a transfusion of packed red cells for [name] on 02/16/2020  Total encounter time 45 minutes.Sarajane Jews C. Kellye Mizner, MD 02/13/20 11:43 AM Medical Oncology and Hematology [facility] [place], [place] [address] Tel. [phone]    Fax. [phone]   I, Wilburn Mylar, am acting as scribe for Dr. [name]. Sharisa Toves.  I, Lurline Del MD, have reviewed the above documentation for accuracy and completeness, and I agree with the above.    *Total Encounter Time as defined by the Centers for Medicare and Medicaid Services includes, in addition to the face-to-face time of a patient visit (documented in the note above) non-face-to-face time: obtaining and reviewing outside history, ordering and reviewing medications, tests or procedures, care coordination (communications with other health care professionals or caregivers) and documentation in the medical record.

## 2020-02-13 ENCOUNTER — Inpatient Hospital Stay: Payer: 59

## 2020-02-13 ENCOUNTER — Other Ambulatory Visit: Payer: Self-pay | Admitting: *Deleted

## 2020-02-13 ENCOUNTER — Inpatient Hospital Stay (HOSPITAL_BASED_OUTPATIENT_CLINIC_OR_DEPARTMENT_OTHER): Payer: 59 | Admitting: Oncology

## 2020-02-13 ENCOUNTER — Other Ambulatory Visit: Payer: Self-pay

## 2020-02-13 VITALS — BP 124/81 | HR 71 | Temp 98.7°F | Resp 20 | Ht 59.0 in | Wt 220.9 lb

## 2020-02-13 DIAGNOSIS — C50812 Malignant neoplasm of overlapping sites of left female breast: Secondary | ICD-10-CM | POA: Diagnosis not present

## 2020-02-13 DIAGNOSIS — C541 Malignant neoplasm of endometrium: Secondary | ICD-10-CM

## 2020-02-13 DIAGNOSIS — Z171 Estrogen receptor negative status [ER-]: Secondary | ICD-10-CM

## 2020-02-13 DIAGNOSIS — D649 Anemia, unspecified: Secondary | ICD-10-CM

## 2020-02-13 DIAGNOSIS — C50112 Malignant neoplasm of central portion of left female breast: Secondary | ICD-10-CM | POA: Diagnosis not present

## 2020-02-13 DIAGNOSIS — Z5112 Encounter for antineoplastic immunotherapy: Secondary | ICD-10-CM | POA: Diagnosis not present

## 2020-02-13 DIAGNOSIS — Z95828 Presence of other vascular implants and grafts: Secondary | ICD-10-CM

## 2020-02-13 LAB — CMP (CANCER CENTER ONLY)
ALT: 25 U/L (ref 0–44)
AST: 23 U/L (ref 15–41)
Albumin: 3.4 g/dL — ABNORMAL LOW (ref 3.5–5.0)
Alkaline Phosphatase: 77 U/L (ref 38–126)
Anion gap: 10 (ref 5–15)
BUN: 10 mg/dL (ref 8–23)
CO2: 25 mmol/L (ref 22–32)
Calcium: 9.1 mg/dL (ref 8.9–10.3)
Chloride: 105 mmol/L (ref 98–111)
Creatinine: 0.85 mg/dL (ref 0.44–1.00)
GFR, Est AFR Am: >60 mL/min
GFR, Estimated: >60 mL/min
Glucose, Bld: 103 mg/dL — ABNORMAL HIGH (ref 70–99)
Potassium: 3.7 mmol/L (ref 3.5–5.1)
Sodium: 140 mmol/L (ref 135–145)
Total Bilirubin: 0.4 mg/dL (ref 0.3–1.2)
Total Protein: 7.9 g/dL (ref 6.5–8.1)

## 2020-02-13 LAB — CBC WITH DIFFERENTIAL (CANCER CENTER ONLY)
Abs Immature Granulocytes: 0.05 10*3/uL (ref 0.00–0.07)
Basophils Absolute: 0 10*3/uL (ref 0.0–0.1)
Basophils Relative: 1 %
Eosinophils Absolute: 0 10*3/uL (ref 0.0–0.5)
Eosinophils Relative: 0 %
HCT: 24 % — ABNORMAL LOW (ref 36.0–46.0)
Hemoglobin: 7.5 g/dL — ABNORMAL LOW (ref 12.0–15.0)
Immature Granulocytes: 1 %
Lymphocytes Relative: 23 %
Lymphs Abs: 1.3 10*3/uL (ref 0.7–4.0)
MCH: 29 pg (ref 26.0–34.0)
MCHC: 31.3 g/dL (ref 30.0–36.0)
MCV: 92.7 fL (ref 80.0–100.0)
Monocytes Absolute: 0.7 10*3/uL (ref 0.1–1.0)
Monocytes Relative: 12 %
Neutro Abs: 3.5 10*3/uL (ref 1.7–7.7)
Neutrophils Relative %: 63 %
Platelet Count: 167 10*3/uL (ref 150–400)
RBC: 2.59 MIL/uL — ABNORMAL LOW (ref 3.87–5.11)
RDW: 20.4 % — ABNORMAL HIGH (ref 11.5–15.5)
WBC Count: 5.6 10*3/uL (ref 4.0–10.5)
nRBC: 0 % (ref 0.0–0.2)

## 2020-02-13 MED ORDER — SODIUM CHLORIDE 0.9 % IV SOLN
800.0000 mg/m2 | Freq: Once | INTRAVENOUS | Status: AC
  Administered 2020-02-13: 1710 mg via INTRAVENOUS
  Filled 2020-02-13: qty 44.97

## 2020-02-13 MED ORDER — HEPARIN SOD (PORK) LOCK FLUSH 100 UNIT/ML IV SOLN
500.0000 [IU] | Freq: Once | INTRAVENOUS | Status: AC | PRN
  Administered 2020-02-13: 500 [IU]
  Filled 2020-02-13: qty 5

## 2020-02-13 MED ORDER — DIPHENHYDRAMINE HCL 25 MG PO CAPS
25.0000 mg | ORAL_CAPSULE | Freq: Once | ORAL | Status: AC
  Administered 2020-02-13: 25 mg via ORAL

## 2020-02-13 MED ORDER — SODIUM CHLORIDE 0.9% FLUSH
10.0000 mL | Freq: Once | INTRAVENOUS | Status: AC
  Administered 2020-02-13: 10 mL
  Filled 2020-02-13: qty 10

## 2020-02-13 MED ORDER — SODIUM CHLORIDE 0.9 % IV SOLN
270.0000 mg | Freq: Once | INTRAVENOUS | Status: AC
  Administered 2020-02-13: 270 mg via INTRAVENOUS
  Filled 2020-02-13: qty 27

## 2020-02-13 MED ORDER — SODIUM CHLORIDE 0.9 % IV SOLN
Freq: Once | INTRAVENOUS | Status: AC
  Filled 2020-02-13: qty 250

## 2020-02-13 MED ORDER — PALONOSETRON HCL INJECTION 0.25 MG/5ML
INTRAVENOUS | Status: AC
  Filled 2020-02-13: qty 5

## 2020-02-13 MED ORDER — PALONOSETRON HCL INJECTION 0.25 MG/5ML
0.2500 mg | Freq: Once | INTRAVENOUS | Status: AC
  Administered 2020-02-13: 0.25 mg via INTRAVENOUS

## 2020-02-13 MED ORDER — SODIUM CHLORIDE 0.9 % IV SOLN
150.0000 mg | Freq: Once | INTRAVENOUS | Status: AC
  Administered 2020-02-13: 150 mg via INTRAVENOUS
  Filled 2020-02-13: qty 150

## 2020-02-13 MED ORDER — ACETAMINOPHEN 325 MG PO TABS: 650.0000 mg | ORAL_TABLET | Freq: Once | ORAL | Status: DC

## 2020-02-13 MED ORDER — SODIUM CHLORIDE 0.9 % IV SOLN
10.0000 mg | Freq: Once | INTRAVENOUS | Status: AC
  Administered 2020-02-13: 10 mg via INTRAVENOUS
  Filled 2020-02-13: qty 10

## 2020-02-13 MED ORDER — SODIUM CHLORIDE 0.9% FLUSH
10.0000 mL | INTRAVENOUS | Status: DC | PRN
  Administered 2020-02-13: 10 mL
  Filled 2020-02-13: qty 10

## 2020-02-13 NOTE — Progress Notes (Signed)
Per Dr. Jana Hakim, patient can receive 1 unit of PRBCs if wanted. Hgb 7.5 today. Patient accepted. Message sent for scheduling. Patient verbalized understanding of schedule. Message sent to Dr. [name] RN for orders.

## 2020-02-13 NOTE — Patient Instructions (Signed)
[facility] Discharge Instructions for Patients Receiving Chemotherapy  Today you received the following chemotherapy agents: Gemzar/Carboplatin.  To help prevent nausea and vomiting after your treatment, we encourage you to take your nausea medication as directed.   If you develop nausea and vomiting that is not controlled by your nausea medication, call the clinic.   BELOW ARE SYMPTOMS THAT SHOULD BE REPORTED IMMEDIATELY:  *FEVER GREATER THAN 100.5 F  *CHILLS WITH OR WITHOUT FEVER  NAUSEA AND VOMITING THAT IS NOT CONTROLLED WITH YOUR NAUSEA MEDICATION  *UNUSUAL SHORTNESS OF BREATH  *UNUSUAL BRUISING OR BLEEDING  TENDERNESS IN MOUTH AND THROAT WITH OR WITHOUT PRESENCE OF ULCERS  *URINARY PROBLEMS  *BOWEL PROBLEMS  UNUSUAL RASH Items with * indicate a potential emergency and should be followed up as soon as possible.  Feel free to call the clinic should you have any questions or concerns. The clinic phone number is [phone].  Please show the CHEMO ALERT CARD at check-in to the Emergency Department and triage nurse.   

## 2020-02-13 NOTE — Progress Notes (Signed)
OK to treat today per Dr Jana Hakim.

## 2020-02-13 NOTE — Progress Notes (Signed)
Nutrition Follow-up:  Patient identified on Malnutrition Screening tool for weight loss  Patient with metastatic breast cancer.  Patient receiving last round of chemotherapy before surgery today.    Met with patient during infusion.  Patient reports diarrhea is improved (c-diff positive).  Patient reports that her appetite is better overall.  Decreases after receiving neulasta shot.  Reports meats taste strange and taste are off.    Medications: reviewed  Labs: reviewed  Anthropometrics:   Weight 220 lb 14.4 oz today decreased from 235 lb on 3/16   NUTRITION DIAGNOSIS: Unintentional weight loss continues   INTERVENTION:  Discussed foods high in protein other than meats.  Encouraged good nutrition leading up to surgery.  Patient has contact information    MONITORING, EVALUATION, GOAL: Patient will consume adequate calories and protein to maintain muscle mass during treatment   NEXT VISIT: no follow-up, Patient to contact RD as needed  [name], [place], [place] Registered Dietitian [phone] (pager)

## 2020-02-14 ENCOUNTER — Telehealth: Payer: Self-pay | Admitting: Oncology

## 2020-02-14 NOTE — Telephone Encounter (Signed)
Cancelled appts per 4/27 los.

## 2020-02-15 ENCOUNTER — Telehealth (HOSPITAL_COMMUNITY): Payer: Self-pay

## 2020-02-15 ENCOUNTER — Ambulatory Visit: Payer: 59

## 2020-02-15 DIAGNOSIS — C50112 Malignant neoplasm of central portion of left female breast: Secondary | ICD-10-CM

## 2020-02-15 NOTE — Telephone Encounter (Signed)
LM for patient to return call to discuss plan re: coreg, echo

## 2020-02-15 NOTE — Telephone Encounter (Signed)
-----   Message from Chauncey Cruel, MD sent at 02/14/2020  5:30 PM EDT ----- Usually the cardiologists do the cardiac meds, so I would go that route  Thanks!] GM ----- Message ----- From: Valeda Malm, RN Sent: 02/14/2020   2:24 PM EDT To: Larey Dresser, MD, Claudia Pollock, #  Should I call patient to start her on this new medication or Dr Jana Hakim will?  Should I go ahead and arrange for visit with echo in 6 weeks? ----- Message ----- From: Larey Dresser, MD Sent: 02/12/2020   9:44 PM EDT To: Lieutenant Diego, MD, #  Yes, just saw that.  EF and strain are lower.  I would start her on Coreg 3.125 mg bid and hold Herceptin.  Happy to see her in the office to adjust meds and set up echo for 6 wks or so to see if function improves.  Dalton ----- Message ----- From: Chauncey Cruel, MD Sent: 02/09/2020   2:50 PM EDT To: Larey Dresser, MD  Hi Dalton-- don't know if you've had a chance to look at [facility] latest echo, which shows some decrease in her EF-- should we hold herceptin?  Gus

## 2020-02-16 ENCOUNTER — Other Ambulatory Visit: Payer: Self-pay | Admitting: Oncology

## 2020-02-16 ENCOUNTER — Other Ambulatory Visit: Payer: Self-pay

## 2020-02-16 ENCOUNTER — Inpatient Hospital Stay: Payer: 59

## 2020-02-16 DIAGNOSIS — C50812 Malignant neoplasm of overlapping sites of left female breast: Secondary | ICD-10-CM

## 2020-02-16 DIAGNOSIS — Z171 Estrogen receptor negative status [ER-]: Secondary | ICD-10-CM

## 2020-02-16 DIAGNOSIS — D649 Anemia, unspecified: Secondary | ICD-10-CM

## 2020-02-16 DIAGNOSIS — Z5112 Encounter for antineoplastic immunotherapy: Secondary | ICD-10-CM | POA: Diagnosis not present

## 2020-02-16 LAB — CBC WITH DIFFERENTIAL (CANCER CENTER ONLY)
Abs Immature Granulocytes: 0.01 10*3/uL (ref 0.00–0.07)
Basophils Absolute: 0 10*3/uL (ref 0.0–0.1)
Basophils Relative: 1 %
Eosinophils Absolute: 0 10*3/uL (ref 0.0–0.5)
Eosinophils Relative: 1 %
HCT: 22.3 % — ABNORMAL LOW (ref 36.0–46.0)
Hemoglobin: 7 g/dL — ABNORMAL LOW (ref 12.0–15.0)
Immature Granulocytes: 0 %
Lymphocytes Relative: 26 %
Lymphs Abs: 0.9 10*3/uL (ref 0.7–4.0)
MCH: 29.9 pg (ref 26.0–34.0)
MCHC: 31.4 g/dL (ref 30.0–36.0)
MCV: 95.3 fL (ref 80.0–100.0)
Monocytes Absolute: 0.2 10*3/uL (ref 0.1–1.0)
Monocytes Relative: 6 %
Neutro Abs: 2.3 10*3/uL (ref 1.7–7.7)
Neutrophils Relative %: 66 %
Platelet Count: 154 10*3/uL (ref 150–400)
RBC: 2.34 MIL/uL — ABNORMAL LOW (ref 3.87–5.11)
RDW: 20.5 % — ABNORMAL HIGH (ref 11.5–15.5)
WBC Count: 3.5 10*3/uL — ABNORMAL LOW (ref 4.0–10.5)
nRBC: 0 % (ref 0.0–0.2)

## 2020-02-16 LAB — CMP (CANCER CENTER ONLY)
ALT: 34 U/L (ref 0–44)
AST: 27 U/L (ref 15–41)
Albumin: 3.4 g/dL — ABNORMAL LOW (ref 3.5–5.0)
Alkaline Phosphatase: 62 U/L (ref 38–126)
Anion gap: 8 (ref 5–15)
BUN: 17 mg/dL (ref 8–23)
CO2: 25 mmol/L (ref 22–32)
Calcium: 8.6 mg/dL — ABNORMAL LOW (ref 8.9–10.3)
Chloride: 105 mmol/L (ref 98–111)
Creatinine: 0.85 mg/dL (ref 0.44–1.00)
GFR, Est AFR Am: >60 mL/min (ref >60)
GFR, Estimated: >60 mL/min (ref >60)
Glucose, Bld: 117 mg/dL — ABNORMAL HIGH (ref 70–99)
Potassium: 3.7 mmol/L (ref 3.5–5.1)
Sodium: 138 mmol/L (ref 135–145)
Total Bilirubin: 0.7 mg/dL (ref 0.3–1.2)
Total Protein: 7.6 g/dL (ref 6.5–8.1)

## 2020-02-16 LAB — SAMPLE TO BLOOD BANK

## 2020-02-16 LAB — ABO/RH: ABO/RH(D): A POS

## 2020-02-16 LAB — PREPARE RBC (CROSSMATCH)

## 2020-02-16 MED ORDER — HEPARIN SOD (PORK) LOCK FLUSH 100 UNIT/ML IV SOLN
500.0000 [IU] | Freq: Every day | INTRAVENOUS | Status: AC | PRN
  Administered 2020-02-16: 500 [IU]
  Filled 2020-02-16: qty 5

## 2020-02-16 MED ORDER — ACETAMINOPHEN 325 MG PO TABS
ORAL_TABLET | ORAL | Status: AC
  Filled 2020-02-16: qty 2

## 2020-02-16 MED ORDER — SODIUM CHLORIDE 0.9% FLUSH
10.0000 mL | INTRAVENOUS | Status: AC | PRN
  Administered 2020-02-16: 10 mL
  Filled 2020-02-16: qty 10

## 2020-02-16 MED ORDER — SODIUM CHLORIDE 0.9% IV SOLUTION
250.0000 mL | Freq: Once | INTRAVENOUS | Status: AC
  Administered 2020-02-16: 09:00:00 250 mL via INTRAVENOUS
  Filled 2020-02-16: qty 250

## 2020-02-16 MED ORDER — DIPHENHYDRAMINE HCL 25 MG PO CAPS
ORAL_CAPSULE | ORAL | Status: AC
  Filled 2020-02-16: qty 1

## 2020-02-16 MED ORDER — DIPHENHYDRAMINE HCL 25 MG PO CAPS
25.0000 mg | ORAL_CAPSULE | Freq: Once | ORAL | Status: AC
  Administered 2020-02-16: 09:00:00 25 mg via ORAL

## 2020-02-16 MED ORDER — ACETAMINOPHEN 325 MG PO TABS
650.0000 mg | ORAL_TABLET | Freq: Once | ORAL | Status: AC
  Administered 2020-02-16: 650 mg via ORAL

## 2020-02-16 NOTE — Patient Instructions (Signed)

## 2020-02-17 LAB — BPAM RBC
Blood Product Expiration Date: 202105182359
Blood Product Expiration Date: 202105182359
ISSUE DATE / TIME: 202104300937
ISSUE DATE / TIME: 202104300937
Unit Type and Rh: 6200
Unit Type and Rh: 6200

## 2020-02-17 LAB — TYPE AND SCREEN
ABO/RH(D): A POS
Antibody Screen: NEGATIVE
Unit division: 0
Unit division: 0

## 2020-02-19 MED ORDER — CARVEDILOL 3.125 MG PO TABS
3.1250 mg | ORAL_TABLET | Freq: Two times a day (BID) | ORAL | 3 refills | Status: DC
Start: 2020-02-19 — End: 2020-03-26

## 2020-02-19 NOTE — Telephone Encounter (Signed)
Spoke with patient. Made her aware of MD recommendations to start Coreg 3.125mg  (1 tab) bid and repeat echo in 6 weeks. Medication sent to pharmacy. Appts scheduled. Questions answered. Pt verbalized understanding.

## 2020-02-21 ENCOUNTER — Ambulatory Visit
Admission: RE | Admit: 2020-02-21 | Discharge: 2020-02-21 | Disposition: A | Discharge status: Home / Self Care | Payer: 59 | Source: Ambulatory Visit | Attending: Oncology | Admitting: Oncology

## 2020-02-21 ENCOUNTER — Other Ambulatory Visit: Payer: Self-pay

## 2020-02-21 ENCOUNTER — Other Ambulatory Visit: Payer: Self-pay | Admitting: Oncology

## 2020-02-21 DIAGNOSIS — C50112 Malignant neoplasm of central portion of left female breast: Secondary | ICD-10-CM

## 2020-02-21 DIAGNOSIS — C541 Malignant neoplasm of endometrium: Secondary | ICD-10-CM

## 2020-02-21 DIAGNOSIS — Z171 Estrogen receptor negative status [ER-]: Secondary | ICD-10-CM

## 2020-02-21 DIAGNOSIS — C50812 Malignant neoplasm of overlapping sites of left female breast: Secondary | ICD-10-CM

## 2020-02-21 MED ORDER — GADOBUTROL 1 MMOL/ML IV SOLN
10.0000 mL | Freq: Once | INTRAVENOUS | Status: AC | PRN
  Administered 2020-02-21: 10 mL via INTRAVENOUS

## 2020-02-21 NOTE — Progress Notes (Addendum)
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 with cardiology closely to monitor her cardiac function.  [name] received a blood transfusion last week, however did not have labs today.  We will have her go by lab after her visit today for a CBC and CMET.    [name] will f/u with Dr. Aundra Dubin on 6/8 for echo and visit with him.  At that point she will have had her surgery.  She will see [place] on 03/28/2020 to review her pathology, and to resume her  trastuzumab.  She knows to call for any questions that may arise between now and her next appointment.  We are happy to see her sooner if needed.   Total encounter time: 30 minutes*  Wilber Bihari, NP 02/22/20 10:28 AM Medical Oncology and Hematology [facility] [place], [place] [address] Tel. [phone]    Fax. [phone]    ADDENDUM: Denaly has had a wonderful radiologic response to her neoadjuvant treatment.  She is now ready to proceed to definitive surgery.  Her case will be discussed in conference next week.  I believe she will be a good candidate for breast conservation.  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  There was divergent opinion among the cardio oncologists whether or not to interrupt but we decided to go conservative and we are holding anti-HER-2 treatment for now.  She will have an echo within 6 weeks and hopefully we can resume treatment then.  In the meantime she has been started on Coreg twice daily and she is tolerating it well.  We showed Tran the images from her MRI.  She was very pleased with the results.  I personally saw this patient and performed a substantive portion of this encounter with the listed APP documented above.   Chauncey Cruel, MD Medical Oncology and Hematology [facility] [address] [place], [place] [address] Tel. [phone]    Fax. [phone]    *Total Encounter Time as defined by the Centers for Medicare and Medicaid Services includes, in addition to the face-to-face time of a patient visit (documented in the note above) non-face-to-face time: obtaining and reviewing outside history, ordering and reviewing medications, tests or procedures, care coordination (communications with other health care professionals or caregivers) and documentation in the medical record.

## 2020-02-22 ENCOUNTER — Encounter: Payer: Self-pay | Admitting: Adult Health

## 2020-02-22 ENCOUNTER — Other Ambulatory Visit: Payer: 59

## 2020-02-22 ENCOUNTER — Other Ambulatory Visit: Payer: Self-pay

## 2020-02-22 ENCOUNTER — Inpatient Hospital Stay: Payer: 59 | Attending: Oncology | Admitting: Adult Health

## 2020-02-22 ENCOUNTER — Inpatient Hospital Stay: Payer: 59

## 2020-02-22 ENCOUNTER — Telehealth: Payer: Self-pay | Admitting: Adult Health

## 2020-02-22 VITALS — BP 146/94 | HR 70 | Temp 98.3°F | Resp 18 | Ht 59.0 in | Wt 223.8 lb

## 2020-02-22 DIAGNOSIS — Z171 Estrogen receptor negative status [ER-]: Secondary | ICD-10-CM

## 2020-02-22 DIAGNOSIS — Z801 Family history of malignant neoplasm of trachea, bronchus and lung: Secondary | ICD-10-CM | POA: Diagnosis not present

## 2020-02-22 DIAGNOSIS — Z8542 Personal history of malignant neoplasm of other parts of uterus: Secondary | ICD-10-CM | POA: Diagnosis present

## 2020-02-22 DIAGNOSIS — Z90722 Acquired absence of ovaries, bilateral: Secondary | ICD-10-CM | POA: Insufficient documentation

## 2020-02-22 DIAGNOSIS — Z8249 Family history of ischemic heart disease and other diseases of the circulatory system: Secondary | ICD-10-CM | POA: Diagnosis not present

## 2020-02-22 DIAGNOSIS — Z833 Family history of diabetes mellitus: Secondary | ICD-10-CM | POA: Insufficient documentation

## 2020-02-22 DIAGNOSIS — Z90710 Acquired absence of both cervix and uterus: Secondary | ICD-10-CM | POA: Insufficient documentation

## 2020-02-22 DIAGNOSIS — Z9079 Acquired absence of other genital organ(s): Secondary | ICD-10-CM | POA: Diagnosis not present

## 2020-02-22 DIAGNOSIS — Z803 Family history of malignant neoplasm of breast: Secondary | ICD-10-CM | POA: Diagnosis not present

## 2020-02-22 DIAGNOSIS — Z170 Estrogen receptor positive status [ER+]: Secondary | ICD-10-CM | POA: Insufficient documentation

## 2020-02-22 DIAGNOSIS — Z923 Personal history of irradiation: Secondary | ICD-10-CM | POA: Diagnosis not present

## 2020-02-22 DIAGNOSIS — Z79899 Other long term (current) drug therapy: Secondary | ICD-10-CM | POA: Insufficient documentation

## 2020-02-22 DIAGNOSIS — C50812 Malignant neoplasm of overlapping sites of left female breast: Secondary | ICD-10-CM

## 2020-02-22 DIAGNOSIS — Z452 Encounter for adjustment and management of vascular access device: Secondary | ICD-10-CM | POA: Diagnosis not present

## 2020-02-22 DIAGNOSIS — I10 Essential (primary) hypertension: Secondary | ICD-10-CM | POA: Insufficient documentation

## 2020-02-22 DIAGNOSIS — Z853 Personal history of malignant neoplasm of breast: Secondary | ICD-10-CM | POA: Diagnosis present

## 2020-02-22 LAB — CBC WITH DIFFERENTIAL (CANCER CENTER ONLY)
Abs Immature Granulocytes: 0.01 10*3/uL (ref 0.00–0.07)
Basophils Absolute: 0 10*3/uL (ref 0.0–0.1)
Basophils Relative: 1 %
Eosinophils Absolute: 0 10*3/uL (ref 0.0–0.5)
Eosinophils Relative: 1 %
HCT: 31.1 % — ABNORMAL LOW (ref 36.0–46.0)
Hemoglobin: 9.7 g/dL — ABNORMAL LOW (ref 12.0–15.0)
Immature Granulocytes: 0 %
Lymphocytes Relative: 34 %
Lymphs Abs: 1.5 10*3/uL (ref 0.7–4.0)
MCH: 29.6 pg (ref 26.0–34.0)
MCHC: 31.2 g/dL (ref 30.0–36.0)
MCV: 94.8 fL (ref 80.0–100.0)
Monocytes Absolute: 0.7 10*3/uL (ref 0.1–1.0)
Monocytes Relative: 17 %
Neutro Abs: 2 10*3/uL (ref 1.7–7.7)
Neutrophils Relative %: 47 %
Platelet Count: 56 10*3/uL — ABNORMAL LOW (ref 150–400)
RBC: 3.28 MIL/uL — ABNORMAL LOW (ref 3.87–5.11)
RDW: 17.5 % — ABNORMAL HIGH (ref 11.5–15.5)
WBC Count: 4.3 10*3/uL (ref 4.0–10.5)
nRBC: 0 % (ref 0.0–0.2)

## 2020-02-22 LAB — CMP (CANCER CENTER ONLY)
ALT: 30 U/L (ref 0–44)
AST: 21 U/L (ref 15–41)
Albumin: 3.6 g/dL (ref 3.5–5.0)
Alkaline Phosphatase: 64 U/L (ref 38–126)
Anion gap: 11 (ref 5–15)
BUN: 10 mg/dL (ref 8–23)
CO2: 22 mmol/L (ref 22–32)
Calcium: 9 mg/dL (ref 8.9–10.3)
Chloride: 111 mmol/L (ref 98–111)
Creatinine: 0.8 mg/dL (ref 0.44–1.00)
GFR, Est AFR Am: >60 mL/min (ref >60)
GFR, Estimated: >60 mL/min (ref >60)
Glucose, Bld: 91 mg/dL (ref 70–99)
Potassium: 4.2 mmol/L (ref 3.5–5.1)
Sodium: 144 mmol/L (ref 135–145)
Total Bilirubin: 0.3 mg/dL (ref 0.3–1.2)
Total Protein: 8.3 g/dL — ABNORMAL HIGH (ref 6.5–8.1)

## 2020-02-22 NOTE — Telephone Encounter (Signed)
Cancelled appts and scheduled appts per 5/6 los. Pt confirmed next appt date and time.

## 2020-02-22 NOTE — Patient Instructions (Signed)
Two Gram Sodium Diet 2000 mg  What is Sodium? Sodium is a mineral found naturally in many foods. The most significant source of sodium in the diet is table salt, which is about 40% sodium.  Processed, convenience, and preserved foods also contain a large amount of sodium.  The body needs only 500 mg of sodium daily to function,  A normal diet provides more than enough sodium even if you do not use salt.  Why Limit Sodium? A build up of sodium in the body can cause thirst, increased blood pressure, shortness of breath, and water retention.  Decreasing sodium in the diet can reduce edema and risk of heart attack or stroke associated with high blood pressure.  Keep in mind that there are many other factors involved in these health problems.  Heredity, obesity, lack of exercise, cigarette smoking, stress and what you eat all play a role.  General Guidelines:  Do not add salt at the table or in cooking.  One teaspoon of salt contains over 2 grams of sodium.  Read food labels  Avoid processed and convenience foods  Ask your dietitian before eating any foods not dicussed in the menu planning guidelines  Consult your physician if you wish to use a salt substitute or a sodium containing medication such as antacids.  Limit milk and milk products to 16 oz (2 cups) per day.  Shopping Hints:  READ LABELS!! "Dietetic" does not necessarily mean low sodium.  Salt and other sodium ingredients are often added to foods during processing.   Menu Planning Guidelines Food Group Choose More Often Avoid  Beverages (see also the milk group All fruit juices, low-sodium, salt-free vegetables juices, low-sodium carbonated beverages Regular vegetable or tomato juices, commercially softened water used for drinking or cooking  Breads and Cereals Enriched white, wheat, rye and pumpernickel bread, hard rolls and dinner rolls; muffins, cornbread and waffles; most dry cereals, cooked cereal without added salt; unsalted  crackers and breadsticks; low sodium or homemade bread crumbs Bread, rolls and crackers with salted tops; quick breads; instant hot cereals; pancakes; commercial bread stuffing; self-rising flower and biscuit mixes; regular bread crumbs or cracker crumbs  Desserts and Sweets Desserts and sweets mad with mild should be within allowance Instant pudding mixes and cake mixes  Fats Butter or margarine; vegetable oils; unsalted salad dressings, regular salad dressings limited to 1 Tbs; light, sour and heavy cream Regular salad dressings containing bacon fat, bacon bits, and salt pork; snack dips made with instant soup mixes or processed cheese; salted nuts  Fruits Most fresh, frozen and canned fruits Fruits processed with salt or sodium-containing ingredient (some dried fruits are processed with sodium sulfites        Vegetables Fresh, frozen vegetables and low- sodium canned vegetables Regular canned vegetables, sauerkraut, pickled vegetables, and others prepared in brine; frozen vegetables in sauces; vegetables seasoned with ham, bacon or salt pork  Condiments, Sauces, Miscellaneous  Salt substitute with physician's approval; pepper, herbs, spices; vinegar, lemon or lime juice; hot pepper sauce; garlic powder, onion powder, low sodium soy sauce (1 Tbs.); low sodium condiments (ketchup, chili sauce, mustard) in limited amounts (1 tsp.) fresh ground horseradish; unsalted tortilla chips, pretzels, potato chips, popcorn, salsa (1/4 cup) Any seasoning made with salt including garlic salt, celery salt, onion salt, and seasoned salt; sea salt, rock salt, kosher salt; meat tenderizers; monosodium glutamate; mustard, regular soy sauce, barbecue, sauce, chili sauce, teriyaki sauce, steak sauce, Worcestershire sauce, and most flavored vinegars; canned gravy and mixes;   regular condiments; salted snack foods, olives, picles, relish, horseradish sauce, catsup   Food preparation: Try these seasonings Meats:    Pork  Sage, onion Serve with applesauce  Chicken Poultry seasoning, thyme, parsley Serve with cranberry sauce  Lamb Curry powder, rosemary, garlic, thyme Serve with mint sauce or jelly  Veal Marjoram, basil Serve with current jelly, cranberry sauce  Beef Pepper, bay leaf Serve with dry mustard, unsalted chive butter  Fish [place] leaf, dill Serve with unsalted lemon butter, unsalted parsley butter  Vegetables:    Asparagus Lemon juice   Broccoli Lemon juice   Carrots Mustard dressing parsley, mint, nutmeg, glazed with unsalted butter and sugar   Green beans Marjoram, lemon juice, nutmeg,dill seed   Tomatoes Basil, marjoram, onion   Spice /blend for "Salt Shaker" 4 tsp ground thyme 1 tsp ground sage 3 tsp ground rosemary 4 tsp ground marjoram   Test your knowledge 1. A product that says "Salt Free" may still contain sodium. True or False 2. Garlic Powder and Hot Pepper Sauce an be used as alternative seasonings.True or False 3. Processed foods have more sodium than fresh foods.  True or False 4. Canned Vegetables have less sodium than froze True or False  WAYS TO DECREASE YOUR SODIUM INTAKE 1. Avoid the use of added salt in cooking and at the table.  Table salt (and other prepared seasonings which contain salt) is probably one of the greatest sources of sodium in the diet.  Unsalted foods can gain flavor from the sweet, sour, and butter taste sensations of herbs and spices.  Instead of using salt for seasoning, try the following seasonings with the foods listed.  Remember: how you use them to enhance natural food flavors is limited only by your creativity... Allspice-Meat, fish, eggs, fruit, peas, red and yellow vegetables Almond Extract-Fruit baked goods Anise Seed-Sweet breads, fruit, carrots, beets, cottage cheese, cookies (tastes like licorice) Basil-Meat, fish, eggs, vegetables, rice, vegetables salads, soups, sauces Bay Leaf-Meat, fish, stews, poultry Burnet-Salad, vegetables (cucumber-like  flavor) Caraway Seed-Bread, cookies, cottage cheese, meat, vegetables, cheese, rice Cardamon-Baked goods, fruit, soups Celery Powder or seed-Salads, salad dressings, sauces, meatloaf, soup, bread.Do not use  celery salt Chervil-Meats, salads, fish, eggs, vegetables, cottage cheese (parsley-like flavor) Chili Power-Meatloaf, chicken cheese, corn, eggplant, egg dishes Chives-Salads cottage cheese, egg dishes, soups, vegetables, sauces Cilantro-Salsa, casseroles Cinnamon-Baked goods, fruit, pork, lamb, chicken, carrots Cloves-Fruit, baked goods, fish, pot roast, green beans, beets, carrots Coriander-Pastry, cookies, meat, salads, cheese (lemon-orange flavor) Cumin-Meatloaf, fish,cheese, eggs, cabbage,fruit pie (caraway flavor) Curry Powder-Meat, fruit, eggs, fish, poultry, cottage cheese, vegetables Dill Seed-Meat, cottage cheese, poultry, vegetables, fish, salads, bread Fennel Seed-Bread, cookies, apples, pork, eggs, fish, beets, cabbage, cheese, Licorice-like flavor Garlic-(buds or powder) Salads, meat, poultry, fish, bread, butter, vegetables, potatoes.Do not  use garlic salt Ginger-Fruit, vegetables, baked goods, meat, fish, poultry Horseradish Root-Meet, vegetables, butter Lemon Juice or Extract-Vegetables, fruit, tea, baked goods, fish salads Mace-Baked goods fruit, vegetables, fish, poultry (taste like nutmeg) Maple Extract-Syrups Marjoram-Meat, chicken, fish, vegetables, breads, green salads (taste like Sage) Mint-Tea, lamb, sherbet, vegetables, desserts, carrots, cabbage Mustard, Dry or Seed-Cheese, eggs, meats, vegetables, poultry Nutmeg-Baked goods, fruit, chicken, eggs, vegetables, desserts Onion Powder-Meat, fish, poultry, vegetables, cheese, eggs, bread, rice salads (Do not use   Onion salt) Orange Extract-Desserts, baked goods Oregano-Pasta, eggs, cheese, onions, pork, lamb, fish, chicken, vegetables, green salads Paprika-Meat, fish, poultry, eggs, cheese, vegetables Parsley  Flakes-Butter, vegetables, meat fish, poultry, eggs, bread, salads (certain forms may   Contain sodium Pepper-Meat fish, poultry,   vegetables, eggs Peppermint Extract-Desserts, baked goods Poppy Seed-Eggs, bread, cheese, fruit dressings, baked goods, noodles, vegetables, cottage  Cheese Poultry Seasoning-Poultry,veal Rosemary-Lamb, poultry, meat, fish, cauliflower, turnips,eggs bread Saffron-Rice, bread, veal, chicken, fish, eggs Sage-Meat, fish, poultry, onions, eggplant, tomateos, pork, stews Savory-Eggs, salads, poultry, meat, rice, vegetables, soups, pork Tarragon-Meat, poultry, fish, eggs, butter, vegetables (licorice-like flavor)  Thyme-Meat, poultry, fish, eggs, vegetables, (clover-like flavor), sauces, soups Tumeric-Salads, butter, eggs, fish, rice, vegetables (saffron-like flavor) Vanilla Extract-Baked goods, candy Vinegar-Salads, vegetables, meat marinades Walnut Extract-baked goods, candy  2. Choose your Foods Wisely   The following is a list of foods to avoid which are high in sodium:  Meats-Avoid all smoked, canned, salt cured, dried and kosher meat and fish as well as Anchovies   Lox Bacon    Luncheon meats:Bologna, Liverwurst, Pastrami Canned meat or fish  Marinated herring Caviar    Pepperoni Corned Beef   Pizza Dried chipped beef  Salami Frozen breaded fish or meat Salt pork Frankfurters or hot dogs  Sardines Gefilte fish   Sausage Ham (boiled ham, Proscuitto Smoked butt    spiced ham)   Spam      TV Dinners Vegetables Canned vegetables (Regular) Relish Canned mushrooms  Sauerkraut Olives    Tomato juice Pickles  Bakery and Dessert Products Canned puddings  Cream pies Cheesecake   Decorated cakes Cookies  Beverages/Juices Tomato juice, regular  Gatorade   V-8 vegetable juice, regular  Breads and Cereals Biscuit mixes   Salted potato chips, corn chips, pretzels Bread stuffing mixes  Salted crackers and rolls Pancake and waffle mixes Self-rising  flour  Seasonings Accent    Meat sauces Barbecue sauce  Meat tenderizer Catsup    Monosodium glutamate (MSG) Celery salt   Onion salt Chili sauce   Prepared mustard Garlic salt   Salt, seasoned salt, sea salt Gravy mixes   Soy sauce Horseradish   Steak sauce Ketchup   Tartar sauce Lite salt    Teriyaki sauce Marinade mixes   Worcestershire sauce  Others Baking powder   Cocoa and cocoa mixes Baking soda   Commercial casserole mixes Candy-caramels, chocolate  Dehydrated soups    Bars, fudge,nougats  Instant rice and pasta mixes Canned broth or soup  Maraschino cherries Cheese, aged and processed cheese and cheese spreads  Learning Assessment Quiz  Indicated T (for True) or F (for False) for each of the following statements:  1. _____ Fresh fruits and vegetables and unprocessed grains are generally low in sodium 2. _____ Water may contain a considerable amount of sodium, depending on the source 3. _____ You can always tell if a food is high in sodium by tasting it 4. _____ Certain laxatives my be high in sodium and should be avoided unless prescribed   by a physician or pharmacist 5. _____ Salt substitutes may be used freely by anyone on a sodium restricted diet 6. _____ Sodium is present in table salt, food additives and as a natural component of   most foods 7. _____ Table salt is approximately 90% sodium 8. _____ Limiting sodium intake may help prevent excess fluid accumulation in the body 9. _____ On a sodium-restricted diet, seasonings such as bouillon soy sauce, and    cooking wine should be used in place of table salt 10. _____ On an ingredient list, a product which lists monosodium glutamate as the first   ingredient is an appropriate food to include on a low sodium diet  Circle the best answer(s) to the following statements (Hint: there   may be more than one correct answer)  11. On a low-sodium diet, some acceptable snack items are:    A. Olives  F. Bean dip   K.  Grapefruit juice    B. Salted Pretzels G. Commercial Popcorn   L. Canned peaches    C. Carrot Sticks  H. Bouillon   M. Unsalted nuts   D. French fries  I. Peanut butter crackers N. Salami   E. Sweet pickles J. Tomato Juice   O. Pizza  12.  Seasonings that may be used freely on a reduced - sodium diet include   A. Lemon wedges F.Monosodium glutamate K. Celery seed    B.Soysauce   G. Pepper   L. Mustard powder   C. Sea salt  H. Cooking wine  M. Onion flakes   D. Vinegar  E. Prepared horseradish N. Salsa   E. Sage   J. Worcestershire sauce  O. Chutney  

## 2020-02-27 ENCOUNTER — Other Ambulatory Visit: Payer: Self-pay | Admitting: Oncology

## 2020-02-27 ENCOUNTER — Telehealth: Payer: Self-pay | Admitting: *Deleted

## 2020-02-27 NOTE — Telephone Encounter (Signed)
Pt states Dr Jana Hakim was asking the dates of her covid vaccine. States she received the [place] vaccine in her right arm on 01/08/20 and 02/05/20.

## 2020-02-27 NOTE — Telephone Encounter (Signed)
Message left for patient.  Form for [place] disability has been completed and ready for pick up.  Signed release of information form has been placed in [employer] wall bin labeled "Release of Information" request with a note explaining patient needs to hand deliver form and records in person.

## 2020-02-28 ENCOUNTER — Other Ambulatory Visit: Payer: Self-pay | Admitting: Oncology

## 2020-02-28 ENCOUNTER — Ambulatory Visit: Payer: Self-pay | Admitting: Surgery

## 2020-02-28 DIAGNOSIS — C50112 Malignant neoplasm of central portion of left female breast: Secondary | ICD-10-CM

## 2020-02-28 DIAGNOSIS — C50412 Malignant neoplasm of upper-outer quadrant of left female breast: Secondary | ICD-10-CM

## 2020-02-28 NOTE — Progress Notes (Signed)
[name] had her most recent Covid vaccine given into the right arm on 02/03/2020.  This is likely the reason for her enlarged right axillary lymph nodes.  We will nevertheless obtain an ultrasound of the right axilla to confirm.
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
## 2020-03-04 ENCOUNTER — Telehealth: Payer: Self-pay | Admitting: Oncology

## 2020-03-04 ENCOUNTER — Other Ambulatory Visit: Payer: Self-pay | Admitting: Oncology

## 2020-03-04 NOTE — Telephone Encounter (Signed)
Scheduled apt per 5/17 sch message - unable to reach pt . Left message with appt date and time

## 2020-03-05 ENCOUNTER — Ambulatory Visit: Payer: 59 | Admitting: Adult Health

## 2020-03-05 ENCOUNTER — Other Ambulatory Visit: Payer: 59

## 2020-03-05 ENCOUNTER — Telehealth: Payer: Self-pay | Admitting: *Deleted

## 2020-03-05 ENCOUNTER — Ambulatory Visit: Payer: 59

## 2020-03-05 NOTE — Telephone Encounter (Signed)
   [facility] Pre-operative Risk Assessment    HEARTCARE STAFF: - Please ensure there is not already an duplicate clearance open for this procedure - Under Visit Info/Reason for Call, type in Other and utilize the format Clearance MM/DD/YY or Clearance TBD  Request for surgical clearance:  1. What type of surgery is being performed?  LEFT RADIOACTIVE SEED LOCALIZED LUMPECTOMY3   2. When is this surgery scheduled?  TBD   3. What type of clearance is required (medical clearance vs. Pharmacy clearance to hold med vs. Both)?  MEDICAL  4. Are there any medications that need to be held prior to surgery and how long? N/A   5. Practice name and name of physician performing surgery?  [facility] / DR. [name]   6. What is the office phone number?  [phone]   7.   What is the office fax number?  [phone]   ATTN:  KELLY  8.   Anesthesia type (None, local, MAC, general) ?  GENERAL   [name] 03/05/2020, 4:25 PM  _________________________________________________________________   (provider comments below)

## 2020-03-06 NOTE — Telephone Encounter (Signed)
I s/w Sherri at [facility] and informed her that this pt is not seen by our office. Sherri put me on hold for a moment and said the clearance is for Dr. Aundra Dubin. I assured Sherri that I will send the clearance form over to Dr Aundra Dubin over at the [facility]. I will remove from the pre op call back pool.

## 2020-03-06 NOTE — Telephone Encounter (Signed)
    This patient is not followed by our service. Can you please call and inform the requesting office?   Thank you  Kathyrn Drown NP-C [facility] Pager: [phone]

## 2020-03-07 ENCOUNTER — Ambulatory Visit: Payer: 59

## 2020-03-08 ENCOUNTER — Inpatient Hospital Stay: Payer: 59

## 2020-03-08 ENCOUNTER — Other Ambulatory Visit: Payer: Self-pay

## 2020-03-08 ENCOUNTER — Other Ambulatory Visit: Payer: Self-pay | Admitting: Oncology

## 2020-03-08 VITALS — BP 136/77 | HR 74 | Temp 97.8°F | Resp 20 | Wt 224.2 lb

## 2020-03-08 DIAGNOSIS — C50812 Malignant neoplasm of overlapping sites of left female breast: Secondary | ICD-10-CM

## 2020-03-08 DIAGNOSIS — Z452 Encounter for adjustment and management of vascular access device: Secondary | ICD-10-CM | POA: Diagnosis not present

## 2020-03-08 DIAGNOSIS — Z95828 Presence of other vascular implants and grafts: Secondary | ICD-10-CM

## 2020-03-08 DIAGNOSIS — Z171 Estrogen receptor negative status [ER-]: Secondary | ICD-10-CM

## 2020-03-08 LAB — CBC WITH DIFFERENTIAL (CANCER CENTER ONLY)
Abs Immature Granulocytes: 0.01 10*3/uL (ref 0.00–0.07)
Basophils Absolute: 0 10*3/uL (ref 0.0–0.1)
Basophils Relative: 0 %
Eosinophils Absolute: 0.1 10*3/uL (ref 0.0–0.5)
Eosinophils Relative: 2 %
HCT: 29.8 % — ABNORMAL LOW (ref 36.0–46.0)
Hemoglobin: 9.5 g/dL — ABNORMAL LOW (ref 12.0–15.0)
Immature Granulocytes: 0 %
Lymphocytes Relative: 27 %
Lymphs Abs: 1.3 10*3/uL (ref 0.7–4.0)
MCH: 30.7 pg (ref 26.0–34.0)
MCHC: 31.9 g/dL (ref 30.0–36.0)
MCV: 96.4 fL (ref 80.0–100.0)
Monocytes Absolute: 0.8 10*3/uL (ref 0.1–1.0)
Monocytes Relative: 18 %
Neutro Abs: 2.5 10*3/uL (ref 1.7–7.7)
Neutrophils Relative %: 53 %
Platelet Count: 176 10*3/uL (ref 150–400)
RBC: 3.09 MIL/uL — ABNORMAL LOW (ref 3.87–5.11)
RDW: 17.2 % — ABNORMAL HIGH (ref 11.5–15.5)
WBC Count: 4.7 10*3/uL (ref 4.0–10.5)
nRBC: 0 % (ref 0.0–0.2)

## 2020-03-08 LAB — CMP (CANCER CENTER ONLY)
ALT: 21 U/L (ref 0–44)
AST: 18 U/L (ref 15–41)
Albumin: 3.4 g/dL — ABNORMAL LOW (ref 3.5–5.0)
Alkaline Phosphatase: 61 U/L (ref 38–126)
Anion gap: 8 (ref 5–15)
BUN: 24 mg/dL — ABNORMAL HIGH (ref 8–23)
CO2: 26 mmol/L (ref 22–32)
Calcium: 9.3 mg/dL (ref 8.9–10.3)
Chloride: 107 mmol/L (ref 98–111)
Creatinine: 0.93 mg/dL (ref 0.44–1.00)
GFR, Est AFR Am: >60 mL/min (ref >60)
GFR, Estimated: >60 mL/min (ref >60)
Glucose, Bld: 100 mg/dL — ABNORMAL HIGH (ref 70–99)
Potassium: 4.1 mmol/L (ref 3.5–5.1)
Sodium: 141 mmol/L (ref 135–145)
Total Bilirubin: 0.3 mg/dL (ref 0.3–1.2)
Total Protein: 8.6 g/dL — ABNORMAL HIGH (ref 6.5–8.1)

## 2020-03-08 MED ORDER — SODIUM CHLORIDE 0.9% FLUSH
10.0000 mL | Freq: Once | INTRAVENOUS | Status: AC
  Administered 2020-03-08: 10 mL
  Filled 2020-03-08: qty 10

## 2020-03-08 MED ORDER — HEPARIN SOD (PORK) LOCK FLUSH 100 UNIT/ML IV SOLN
500.0000 [IU] | Freq: Once | INTRAVENOUS | Status: AC
  Administered 2020-03-08: 500 [IU] via INTRAVENOUS
  Filled 2020-03-08: qty 5

## 2020-03-08 MED ORDER — SODIUM CHLORIDE 0.9% FLUSH
10.0000 mL | Freq: Once | INTRAVENOUS | Status: AC
  Administered 2020-03-08: 10 mL via INTRAVENOUS
  Filled 2020-03-08: qty 10

## 2020-03-08 NOTE — Progress Notes (Signed)
Per Dr. Jana Hakim: hold treatment today due to EF of 45-50%  Val desk nurse to see patient in the lobby for further clarification.

## 2020-03-08 NOTE — Patient Instructions (Signed)

## 2020-03-12 ENCOUNTER — Other Ambulatory Visit: Payer: Self-pay | Admitting: Oncology

## 2020-03-12 ENCOUNTER — Other Ambulatory Visit: Payer: 59

## 2020-03-12 DIAGNOSIS — C50112 Malignant neoplasm of central portion of left female breast: Secondary | ICD-10-CM

## 2020-03-13 ENCOUNTER — Other Ambulatory Visit: Payer: 59

## 2020-03-13 ENCOUNTER — Ambulatory Visit: Payer: 59 | Admitting: Oncology

## 2020-03-13 ENCOUNTER — Ambulatory Visit: Payer: 59 | Admitting: Plastic Surgery

## 2020-03-13 ENCOUNTER — Other Ambulatory Visit: Payer: Self-pay | Admitting: *Deleted

## 2020-03-14 ENCOUNTER — Inpatient Hospital Stay: Payer: 59

## 2020-03-14 ENCOUNTER — Telehealth: Payer: Self-pay | Admitting: Oncology

## 2020-03-14 ENCOUNTER — Other Ambulatory Visit: Payer: Self-pay | Admitting: *Deleted

## 2020-03-14 ENCOUNTER — Telehealth: Payer: Self-pay | Admitting: *Deleted

## 2020-03-14 ENCOUNTER — Inpatient Hospital Stay (HOSPITAL_BASED_OUTPATIENT_CLINIC_OR_DEPARTMENT_OTHER): Payer: 59 | Admitting: Oncology

## 2020-03-14 ENCOUNTER — Other Ambulatory Visit: Payer: Self-pay

## 2020-03-14 VITALS — BP 141/93 | HR 70 | Temp 98.5°F | Resp 18 | Ht 59.0 in | Wt 225.4 lb

## 2020-03-14 DIAGNOSIS — Z452 Encounter for adjustment and management of vascular access device: Secondary | ICD-10-CM | POA: Diagnosis not present

## 2020-03-14 DIAGNOSIS — C50112 Malignant neoplasm of central portion of left female breast: Secondary | ICD-10-CM | POA: Diagnosis not present

## 2020-03-14 LAB — CMP (CANCER CENTER ONLY)
ALT: 16 U/L (ref 0–44)
AST: 20 U/L (ref 15–41)
Albumin: 3.6 g/dL (ref 3.5–5.0)
Alkaline Phosphatase: 53 U/L (ref 38–126)
Anion gap: 8 (ref 5–15)
BUN: 18 mg/dL (ref 8–23)
CO2: 27 mmol/L (ref 22–32)
Calcium: 9.4 mg/dL (ref 8.9–10.3)
Chloride: 103 mmol/L (ref 98–111)
Creatinine: 0.89 mg/dL (ref 0.44–1.00)
GFR, Est AFR Am: >60 mL/min (ref >60)
GFR, Estimated: >60 mL/min (ref >60)
Glucose, Bld: 105 mg/dL — ABNORMAL HIGH (ref 70–99)
Potassium: 4.1 mmol/L (ref 3.5–5.1)
Sodium: 138 mmol/L (ref 135–145)
Total Bilirubin: 0.4 mg/dL (ref 0.3–1.2)
Total Protein: 8.2 g/dL — ABNORMAL HIGH (ref 6.5–8.1)

## 2020-03-14 LAB — URINALYSIS, COMPLETE (UACMP) WITH MICROSCOPIC
Bilirubin Urine: NEGATIVE
Glucose, UA: NEGATIVE mg/dL
Ketones, ur: NEGATIVE mg/dL
Nitrite: NEGATIVE
Protein, ur: NEGATIVE mg/dL
Specific Gravity, Urine: 1.017 (ref 1.005–1.030)
pH: 5 (ref 5.0–8.0)

## 2020-03-14 LAB — CBC WITH DIFFERENTIAL (CANCER CENTER ONLY)
Abs Immature Granulocytes: 0.02 10*3/uL (ref 0.00–0.07)
Basophils Absolute: 0 10*3/uL (ref 0.0–0.1)
Basophils Relative: 0 %
Eosinophils Absolute: 0.1 10*3/uL (ref 0.0–0.5)
Eosinophils Relative: 1 %
HCT: 29.7 % — ABNORMAL LOW (ref 36.0–46.0)
Hemoglobin: 9.4 g/dL — ABNORMAL LOW (ref 12.0–15.0)
Immature Granulocytes: 0 %
Lymphocytes Relative: 26 %
Lymphs Abs: 1.3 10*3/uL (ref 0.7–4.0)
MCH: 31.4 pg (ref 26.0–34.0)
MCHC: 31.6 g/dL (ref 30.0–36.0)
MCV: 99.3 fL (ref 80.0–100.0)
Monocytes Absolute: 0.8 10*3/uL (ref 0.1–1.0)
Monocytes Relative: 16 %
Neutro Abs: 2.9 10*3/uL (ref 1.7–7.7)
Neutrophils Relative %: 57 %
Platelet Count: 130 10*3/uL — ABNORMAL LOW (ref 150–400)
RBC: 2.99 MIL/uL — ABNORMAL LOW (ref 3.87–5.11)
RDW: 15.9 % — ABNORMAL HIGH (ref 11.5–15.5)
WBC Count: 5.1 10*3/uL (ref 4.0–10.5)
nRBC: 0 % (ref 0.0–0.2)
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
## 2020-03-14 NOTE — Telephone Encounter (Signed)
Printed medical records for patient pickup. Release [id number]

## 2020-03-14 NOTE — Telephone Encounter (Signed)
This RN spoke with the patient per her call wanting to follow up on call she received from GI stating need for [place]- which she had last week at [place]. She is asking if she needs a biopsy?  This RN obtained US performed at [facility] and reviewed with MD- with no further biopsy at this time.  Per phone discussion- [name] states she is overall feeling much better except for " I think I might have a UTI " as well as " I have a knot in my lower abd that has been there for about a week "  She states the knot is in the LLQ at height of the umbilicus. She states it is constant but not painful. She is concerned especially since her last treatment was a month ago and she is not scheduled for surgery.  She is having loose stools ( note pt had severe diarrhea ) and is not constipated per her perspective.  [name] also states she is currently not scheduled to see Dr [name] or has a date for surgery " he was waiting until I see Dr Aundra Dubin for my cardiac evaluation."  Per above issues - this RN made an appt for lab for UTI concern as well as visit for evaluation of knot.

## 2020-03-15 ENCOUNTER — Telehealth: Payer: Self-pay | Admitting: Oncology

## 2020-03-15 NOTE — Telephone Encounter (Signed)
No 5/27 los. No changes made to pt's schedule.

## 2020-03-16 LAB — URINE CULTURE: Culture: NO GROWTH

## 2020-03-22 ENCOUNTER — Ambulatory Visit (HOSPITAL_COMMUNITY)
Admission: RE | Admit: 2020-03-22 | Discharge: 2020-03-22 | Disposition: A | Discharge status: Home / Self Care | Payer: 59 | Source: Ambulatory Visit | Attending: Oncology | Admitting: Oncology

## 2020-03-22 ENCOUNTER — Other Ambulatory Visit: Payer: Self-pay

## 2020-03-22 DIAGNOSIS — C50112 Malignant neoplasm of central portion of left female breast: Secondary | ICD-10-CM | POA: Diagnosis not present

## 2020-03-22 MED ORDER — SODIUM CHLORIDE (PF) 0.9 % IJ SOLN
INTRAMUSCULAR | Status: AC
  Filled 2020-03-22: qty 50

## 2020-03-22 MED ORDER — IOHEXOL 300 MG/ML  SOLN
100.0000 mL | Freq: Once | INTRAMUSCULAR | Status: AC | PRN
  Administered 2020-03-22: 100 mL via INTRAVENOUS

## 2020-03-22 NOTE — Progress Notes (Signed)
Pharmacist Chemotherapy Monitoring - Follow Up Assessment    I verify that I have reviewed each item in the below checklist:  . Regimen for the patient is scheduled for the appropriate day and plan matches scheduled date. [name] Appropriate non-routine labs are ordered dependent on drug ordered. . If applicable, additional medications reviewed and ordered per protocol based on lifetime cumulative doses and/or treatment regimen.   Plan for follow-up and/or issues identified: Yes . I-vent associated with next due treatment: No . MD and/or nursing notified: No  Krisy Dix K 03/22/2020 1:02 PM

## 2020-03-26 ENCOUNTER — Ambulatory Visit (HOSPITAL_COMMUNITY)
Admission: RE | Admit: 2020-03-26 | Discharge: 2020-03-26 | Disposition: A | Discharge status: Home / Self Care | Payer: 59 | Source: Ambulatory Visit | Attending: Cardiology | Admitting: Cardiology

## 2020-03-26 ENCOUNTER — Ambulatory Visit (HOSPITAL_BASED_OUTPATIENT_CLINIC_OR_DEPARTMENT_OTHER)
Admission: RE | Admit: 2020-03-26 | Discharge: 2020-03-26 | Disposition: A | Discharge status: Home / Self Care | Payer: 59 | Source: Ambulatory Visit | Attending: Cardiology | Admitting: Cardiology

## 2020-03-26 ENCOUNTER — Other Ambulatory Visit: Payer: Self-pay

## 2020-03-26 ENCOUNTER — Encounter (HOSPITAL_COMMUNITY): Payer: Self-pay | Admitting: Cardiology

## 2020-03-26 VITALS — BP 114/86 | HR 75 | Wt 221.2 lb

## 2020-03-26 DIAGNOSIS — Z803 Family history of malignant neoplasm of breast: Secondary | ICD-10-CM | POA: Insufficient documentation

## 2020-03-26 DIAGNOSIS — E785 Hyperlipidemia, unspecified: Secondary | ICD-10-CM | POA: Diagnosis not present

## 2020-03-26 DIAGNOSIS — T451X5A Adverse effect of antineoplastic and immunosuppressive drugs, initial encounter: Secondary | ICD-10-CM | POA: Diagnosis not present

## 2020-03-26 DIAGNOSIS — E119 Type 2 diabetes mellitus without complications: Secondary | ICD-10-CM | POA: Diagnosis not present

## 2020-03-26 DIAGNOSIS — Z833 Family history of diabetes mellitus: Secondary | ICD-10-CM | POA: Insufficient documentation

## 2020-03-26 DIAGNOSIS — T451X5S Adverse effect of antineoplastic and immunosuppressive drugs, sequela: Secondary | ICD-10-CM | POA: Insufficient documentation

## 2020-03-26 DIAGNOSIS — Z853 Personal history of malignant neoplasm of breast: Secondary | ICD-10-CM | POA: Insufficient documentation

## 2020-03-26 DIAGNOSIS — C50112 Malignant neoplasm of central portion of left female breast: Secondary | ICD-10-CM

## 2020-03-26 DIAGNOSIS — I427 Cardiomyopathy due to drug and external agent: Secondary | ICD-10-CM | POA: Diagnosis present

## 2020-03-26 DIAGNOSIS — Z923 Personal history of irradiation: Secondary | ICD-10-CM | POA: Insufficient documentation

## 2020-03-26 DIAGNOSIS — Z8249 Family history of ischemic heart disease and other diseases of the circulatory system: Secondary | ICD-10-CM | POA: Diagnosis not present

## 2020-03-26 DIAGNOSIS — Z79899 Other long term (current) drug therapy: Secondary | ICD-10-CM | POA: Insufficient documentation

## 2020-03-26 DIAGNOSIS — Z90722 Acquired absence of ovaries, bilateral: Secondary | ICD-10-CM | POA: Diagnosis not present

## 2020-03-26 DIAGNOSIS — I10 Essential (primary) hypertension: Secondary | ICD-10-CM | POA: Diagnosis not present

## 2020-03-26 DIAGNOSIS — Z8542 Personal history of malignant neoplasm of other parts of uterus: Secondary | ICD-10-CM | POA: Insufficient documentation

## 2020-03-26 DIAGNOSIS — Z90710 Acquired absence of both cervix and uterus: Secondary | ICD-10-CM | POA: Diagnosis not present

## 2020-03-26 MED ORDER — CARVEDILOL 6.25 MG PO TABS: 6.2500 mg | ORAL_TABLET | Freq: Two times a day (BID) | ORAL | 3 refills | Status: DC

## 2020-03-26 NOTE — Progress Notes (Signed)
  Echocardiogram 2D Echocardiogram has been performed.  [name] 03/26/2020, 3:14 PM

## 2020-03-26 NOTE — Patient Instructions (Signed)
No labs done today.  INCREASE Carvedilol 6.25mg (1 tablet) by mouth 2 times daily.  No other medication changes were made. Please continue all other medications as prescribed.  Your physician recommends that you schedule a follow-up appointment in: 6 weeks with an echo prior to your appointment.  Your physician has requested that you have an echocardiogram. Echocardiography is a painless test that uses sound waves to create images of your heart. It provides your doctor with information about the size and shape of your heart and how well your heart's chambers and valves are working. This procedure takes approximately one hour. There are no restrictions for this procedure.  If you have any questions or concerns before your next appointment please send [place] a message through [place] or call our office at [phone].    TO LEAVE A MESSAGE FOR THE NURSE SELECT OPTION 2, PLEASE LEAVE A MESSAGE INCLUDING: . YOUR NAME . DATE OF BIRTH . CALL BACK NUMBER . REASON FOR CALL**this is important as we prioritize the call backs  [place] AS LONG AS YOU CALL BEFORE 4:00 PM   At the [facility], you and your health needs are our priority. As part of our continuing mission to provide you with exceptional heart care, we have created designated Provider Care Teams. These Care Teams include your primary Cardiologist (physician) and Advanced Practice Providers (APPs- Physician Assistants and Nurse Practitioners) who all work together to provide you with the care you need, when you need it.   You may see any of the following providers on your designated Care Team at your next follow up: [name] Dr Glori Bickers . Dr Loralie Champagne . Darrick Grinder, NP . Lyda Jester, PA . Audry Riles, PharmD   Please be sure to bring in all your medications bottles to every appointment.

## 2020-03-27 MED FILL — Dexamethasone Sodium Phosphate Inj 100 MG/10ML: INTRAMUSCULAR | Qty: 1 | Status: AC

## 2020-03-27 NOTE — Progress Notes (Signed)
Oncology: Dr. Jana Hakim  64 y.o. with history of DM, HTN, hyperlipidemia, endometrial cancer, and breast cancer was referred by Dr. Jana Hakim for cardio-oncology evaluation.  Initial breast cancer diagnosis was in 2012.  She had left breast lumpectomy and radiation.  Recurrence in 7/20, ER+/PR-/HER2+.  She had chemotherapy with trastuzumab/pertuzumab/carboplatin/docetaxol x 6 starting 2/21.  With fall in EF in 4/21, Herceptin was held.  Echo in 4/21 showed EF 45-50%, Coreg was started.   She is currently off Herceptin.  Echo was done today and reviewed, EF up to 55-60% with GLS -14.4%.  She has minimal exertional dyspnea.  No chest pain.  No lightheadedness.   ECG (personally reviewed): NSR, poor RWP  Labs (5/21): K 4.1, creatinine 0.89  PMH: 1. HTN 2. Diabetes 3. Endometrial carcinoma: 8/17 diagnosis, had hysterectomy and BSO.  4. Hyperlipidemia 5. Breast cancer: Initial diagnosis in 2012.  Had left breast lumpectomy and radiation.  Recurrence in 7/20, ER+/PR-/HER2+.  She had chemotherapy with trastuzumab/pertuzumab/carboplatin/docetaxol x 6 starting 2/21.  With fall in EF in 4/21, Herceptin was held.  - Echo (1/21): EF 60-65%, GLS -20%, normal RV.  - Echo (4/21): EF 45-50%, global hypokinesis, GLS -12.6%, mildly decreased RV function.  - Echo (6/21): EF 55-60%, GLS -14.4%, mild LVH, PASP 38, RV normal.   Social History   Socioeconomic History  . Marital status: Divorced    Spouse name: Not on file  . Number of children: 0  . Years of education: Not on file  . Highest education level: Not on file  Occupational History  . Occupation: Education officer, museum    Employer: [employer]  Tobacco Use  . Smoking status: Never Smoker  . Smokeless tobacco: Never Used  Substance and Sexual Activity  . Alcohol use: Yes    Comment: occ  . Drug use: No  . Sexual activity: Not Currently  Other Topics Concern  . Not on file  Social History Narrative   1 son deceased with homicide   Patient has  been divorced twice   Social Determinants of Health   Financial Resource Strain:   . Difficulty of Paying Living Expenses:   Food Insecurity:   . Worried About Charity fundraiser in the Last Year:   . Arboriculturist in the Last Year:   Transportation Needs:   . Film/video editor (Medical):   [name] Lack of Transportation (Non-Medical):   Physical Activity:   . Days of Exercise per Week:   . Minutes of Exercise per Session:   Stress:   . Feeling of Stress :   Social Connections:   . Frequency of Communication with Friends and Family:   . Frequency of Social Gatherings with Friends and Family:   . Attends Religious Services:   . Active Member of Clubs or Organizations:   . Attends Archivist Meetings:   [name] Marital Status:   Intimate Partner Violence:   . Fear of Current or Ex-Partner:   . Emotionally Abused:   [name] Physically Abused:   . Sexually Abused:    Family History  Problem Relation Age of Onset  . Diabetes Mother   . Hypertension Mother   . Stroke Mother   . Heart disease Father        COPD  . Alcohol abuse Father        ETOH dependence  . Breast cancer Maternal Aunt        dx in her 34s  . Lung cancer Maternal Uncle   .  Breast cancer Paternal Aunt   . Cancer Maternal Grandmother        salivary gland cancer  . Colon cancer Neg Hx    ROS: All systems reviewed and negative except as per HPI.   Current Outpatient Medications  Medication Sig Dispense Refill  . carvedilol (COREG) 6.25 MG tablet Take 1 tablet (6.25 mg total) by mouth 2 (two) times daily. 180 tablet 3  . Continuous Blood Gluc Sensor (FREESTYLE LIBRE 14 DAY SENSOR) MISC INJECT 1 SENSOR TO THE SKIN EVERY 14 DAYS FOR CONTINUOUS GLUCOSE MONITORING.    . diphenoxylate-atropine (LOMOTIL) 2.5-0.025 MG tablet 1 to 2 tablets PO QID prn diarrhea 30 tablet 2  . glucose blood (ONE TOUCH ULTRA TEST) test strip 1 each by Other route 2 (two) times daily. Use to check blood sugars twice a day Dx E11.9  100 each 0  . Lancets (ONETOUCH ULTRASOFT) lancets 1 each by Other route 2 (two) times daily. Use to check blood sugars twice a day Dx E11.9 100 each 0  . omeprazole (PRILOSEC) 40 MG capsule Take 1 capsule (40 mg total) by mouth at bedtime. 90 capsule 0  . silver sulfADIAZINE (SILVADENE) 1 % cream Apply 1 application topically daily. 50 g 0  . telmisartan-hydrochlorothiazide (MICARDIS HCT) 80-12.5 MG tablet Take 1 tablet by mouth daily.    . traMADol (ULTRAM) 50 MG tablet Take 1 tablet (50 mg total) by mouth every 6 (six) hours as needed for moderate pain or severe pain. 30 tablet 0  . valACYclovir (VALTREX) 500 MG tablet Take 1 tablet (500 mg total) by mouth 2 (two) times daily. 60 tablet 5  . cholestyramine (QUESTRAN) 4 GM/DOSE powder Take 1 packet (4 g total) by mouth in the morning and at bedtime. For chemo induced diarrhea 378 g 3  . fluticasone (FLONASE) 50 MCG/ACT nasal spray USE 2 SPRAYS IN EACH NOSTRIL ONCE A DAY     No current facility-administered medications for this encounter.   Facility-Administered Medications Ordered in Other Encounters  Medication Dose Route Frequency Provider Last Rate Last Admin  . cloNIDine (CATAPRES) tablet 0.1 mg  0.1 mg Oral Daily Harle Stanford., PA-C   0.1 mg at 11/21/19 1742   BP 114/86   Pulse 75   Wt 100.3 kg (221 lb 3.2 oz)   LMP 05/18/2016   SpO2 100%   BMI 44.68 kg/m  General: NAD Neck: No JVD, no thyromegaly or thyroid nodule.  Lungs: Clear to auscultation bilaterally with normal respiratory effort. CV: Nondisplaced PMI.  Heart regular S1/S2, no S3/S4, no murmur.  No peripheral edema.  No carotid bruit.  Normal pedal pulses.  Abdomen: Soft, nontender, no hepatosplenomegaly, no distention.  Skin: Intact without lesions or rashes.  Neurologic: Alert and oriented x 3.  Psych: Normal affect. Extremities: No clubbing or cyanosis.  HEENT: Normal.   Assessment/Plan: 1. Chemotherapy-related cardiomyopathy: Fall in EF and strain in the setting  of Herceptin use.  Echo in 4/21 with EF 45-50% with GLS -12.6%.  Herceptin was held, Coreg started, and echo today showed EF up to 55-60% with GLS -14.4%.  She is not volume overloaded on exam, NYHA class I-II symptoms.  - I think that she can resume Herceptin, she will need close followup echo in 6 wks.  - Increase Coreg to 6.25 mg bid.  - Continue telmisartan.  If EF falls again, can replace with Entresto.  2. HTN: BP is well-controlled.  3. Type 2 diabetes: Given diabetes diagnosis, at next appt should  check lipids and consider statin use.   Loralie Champagne 03/27/2020

## 2020-03-27 NOTE — Progress Notes (Signed)
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 STK11, SUFU, TERC, TERT, TMEM127, TP53, TSC1, TSC2, VHL, WRN and WT1.    C: METASTATIC BREAST CANCER: JAN 2021 (11) left axillary lymph node biopsy 10/30/2019 documents invasive mammary carcinoma, grade 3, estrogen receptor positive (80%, weak), progesterone receptor negative, HER-2 amplified (3+) MIB-70%  (a) breast MRI 11/23/2019 shows 3.6 cm non-mass-like enhancement in the left breast, with a second more clumped area measuring 4.8 cm, and at least 5 morphologically abnormal left axillary lymph node.  There is a left subpectoral lymph node and a left internal mammary lymph node noted as well.  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  (b) pertuzumab held with cycle 2 because of persistent diarrhea  (c)  anti-HER2 therapy held after cycle 4 because of a drop in EF  (d) carbo/gemzar stopped after cycle 5, last dose 02/13/2020  (13) anti-HER-2 treatment to be resumed after surgery and continued indefinitely  (a) echo 11/09/2019 shows an ejection fraction in the 60-65% range  (b) echo 02/09/2020 shows an ejection fraction in the 45 to 50% range  (c) echo 03/26/2020 shows an ejection fraction in the 55-60% range  (d) trastuzumab resumed 03/28/2020  (14) surgery pending  (15) adjuvant radiation to follow   PLAN: [name] has been off treatment since the end of April.  We have been waiting first of all for cardiac clearance for her surgery.  That appears to be in place so I would expect that she will be having her surgery within the next week or 2.  Her ejection fraction has improved.  We are going to proceed with Herceptin today and 3 weeks from today.  Dr. Aundra Dubin has already scheduled her for repeat echocardiogram July 23.  We have her scheduled for a Herceptin dose July  22 and I wonder if we may be able to move the echo up to the 21st so we have those results before the July 23 visit.  The plan right now is to continue Herceptin so long as we are able to do that from a cardiac point of view.  [name] was not aware of the fact that this is going to be a lifelong treatment for her so long as it is working.  We discussed again today that she has incurable stage IV breast cancer and this requires treatment for the rest of her life  She will see [place] again in 3 in 6 weeks.  She knows to call for any other issues that may develop before then  Total encounter time 35 minutes.Sarajane Jews C. Brentney Goldbach, MD 03/28/20 12:33 PM Medical Oncology and Hematology [facility] [place], [place] [address] Tel. [phone]    Fax. [phone]   I, Wilburn Mylar, am acting as scribe for Dr. [name]. [name].  I, Lurline Del MD, have reviewed the above documentation for  accuracy and completeness, and I agree with the above.   *Total Encounter Time as defined by the Centers for Medicare and Medicaid Services includes, in addition to the face-to-face time of a patient visit (documented in the note above) non-face-to-face time: obtaining and reviewing outside history, ordering and reviewing medications, tests or procedures, care coordination (communications with other health care professionals or caregivers) and documentation in the medical record.

## 2020-03-28 ENCOUNTER — Inpatient Hospital Stay: Payer: 59

## 2020-03-28 ENCOUNTER — Other Ambulatory Visit: Payer: Self-pay

## 2020-03-28 ENCOUNTER — Inpatient Hospital Stay (HOSPITAL_BASED_OUTPATIENT_CLINIC_OR_DEPARTMENT_OTHER): Payer: 59 | Admitting: Oncology

## 2020-03-28 ENCOUNTER — Inpatient Hospital Stay: Payer: 59 | Attending: Oncology

## 2020-03-28 VITALS — BP 131/86 | HR 65 | Temp 98.3°F | Resp 17 | Ht 59.0 in | Wt 220.1 lb

## 2020-03-28 DIAGNOSIS — Z79899 Other long term (current) drug therapy: Secondary | ICD-10-CM | POA: Diagnosis not present

## 2020-03-28 DIAGNOSIS — C50112 Malignant neoplasm of central portion of left female breast: Secondary | ICD-10-CM

## 2020-03-28 DIAGNOSIS — I110 Hypertensive heart disease with heart failure: Secondary | ICD-10-CM | POA: Insufficient documentation

## 2020-03-28 DIAGNOSIS — Z833 Family history of diabetes mellitus: Secondary | ICD-10-CM | POA: Diagnosis not present

## 2020-03-28 DIAGNOSIS — Z9079 Acquired absence of other genital organ(s): Secondary | ICD-10-CM | POA: Insufficient documentation

## 2020-03-28 DIAGNOSIS — Z9221 Personal history of antineoplastic chemotherapy: Secondary | ICD-10-CM | POA: Insufficient documentation

## 2020-03-28 DIAGNOSIS — E785 Hyperlipidemia, unspecified: Secondary | ICD-10-CM | POA: Diagnosis not present

## 2020-03-28 DIAGNOSIS — C50812 Malignant neoplasm of overlapping sites of left female breast: Secondary | ICD-10-CM

## 2020-03-28 DIAGNOSIS — Z803 Family history of malignant neoplasm of breast: Secondary | ICD-10-CM | POA: Insufficient documentation

## 2020-03-28 DIAGNOSIS — I509 Heart failure, unspecified: Secondary | ICD-10-CM | POA: Insufficient documentation

## 2020-03-28 DIAGNOSIS — Z8542 Personal history of malignant neoplasm of other parts of uterus: Secondary | ICD-10-CM | POA: Diagnosis not present

## 2020-03-28 DIAGNOSIS — Z90710 Acquired absence of both cervix and uterus: Secondary | ICD-10-CM | POA: Insufficient documentation

## 2020-03-28 DIAGNOSIS — Z95828 Presence of other vascular implants and grafts: Secondary | ICD-10-CM

## 2020-03-28 DIAGNOSIS — C50412 Malignant neoplasm of upper-outer quadrant of left female breast: Secondary | ICD-10-CM | POA: Diagnosis present

## 2020-03-28 DIAGNOSIS — E119 Type 2 diabetes mellitus without complications: Secondary | ICD-10-CM | POA: Insufficient documentation

## 2020-03-28 DIAGNOSIS — Z170 Estrogen receptor positive status [ER+]: Secondary | ICD-10-CM | POA: Insufficient documentation

## 2020-03-28 DIAGNOSIS — Z923 Personal history of irradiation: Secondary | ICD-10-CM | POA: Diagnosis not present

## 2020-03-28 DIAGNOSIS — Z853 Personal history of malignant neoplasm of breast: Secondary | ICD-10-CM | POA: Insufficient documentation

## 2020-03-28 DIAGNOSIS — Z171 Estrogen receptor negative status [ER-]: Secondary | ICD-10-CM

## 2020-03-28 DIAGNOSIS — Z5112 Encounter for antineoplastic immunotherapy: Secondary | ICD-10-CM | POA: Insufficient documentation

## 2020-03-28 DIAGNOSIS — Z90722 Acquired absence of ovaries, bilateral: Secondary | ICD-10-CM | POA: Insufficient documentation

## 2020-03-28 DIAGNOSIS — Z801 Family history of malignant neoplasm of trachea, bronchus and lung: Secondary | ICD-10-CM | POA: Insufficient documentation

## 2020-03-28 DIAGNOSIS — C773 Secondary and unspecified malignant neoplasm of axilla and upper limb lymph nodes: Secondary | ICD-10-CM | POA: Diagnosis present

## 2020-03-28 DIAGNOSIS — Z8249 Family history of ischemic heart disease and other diseases of the circulatory system: Secondary | ICD-10-CM | POA: Diagnosis not present

## 2020-03-28 LAB — CMP (CANCER CENTER ONLY)
ALT: 17 U/L (ref 0–44)
AST: 16 U/L (ref 15–41)
Albumin: 3.1 g/dL — ABNORMAL LOW (ref 3.5–5.0)
Alkaline Phosphatase: 54 U/L (ref 38–126)
Anion gap: 6 (ref 5–15)
BUN: 23 mg/dL (ref 8–23)
CO2: 24 mmol/L (ref 22–32)
Calcium: 9.6 mg/dL (ref 8.9–10.3)
Chloride: 107 mmol/L (ref 98–111)
Creatinine: 0.94 mg/dL (ref 0.44–1.00)
GFR, Est AFR Am: >60 mL/min (ref >60)
GFR, Estimated: >60 mL/min (ref >60)
Glucose, Bld: 100 mg/dL — ABNORMAL HIGH (ref 70–99)
Potassium: 3.9 mmol/L (ref 3.5–5.1)
Sodium: 137 mmol/L (ref 135–145)
Total Bilirubin: 0.3 mg/dL (ref 0.3–1.2)
Total Protein: 8.7 g/dL — ABNORMAL HIGH (ref 6.5–8.1)

## 2020-03-28 LAB — CBC WITH DIFFERENTIAL (CANCER CENTER ONLY)
Abs Immature Granulocytes: 0.02 10*3/uL (ref 0.00–0.07)
Basophils Absolute: 0 10*3/uL (ref 0.0–0.1)
Basophils Relative: 1 %
Eosinophils Absolute: 0.1 10*3/uL (ref 0.0–0.5)
Eosinophils Relative: 2 %
HCT: 27.1 % — ABNORMAL LOW (ref 36.0–46.0)
Hemoglobin: 8.6 g/dL — ABNORMAL LOW (ref 12.0–15.0)
Immature Granulocytes: 0 %
Lymphocytes Relative: 30 %
Lymphs Abs: 1.5 10*3/uL (ref 0.7–4.0)
MCH: 31.7 pg (ref 26.0–34.0)
MCHC: 31.7 g/dL (ref 30.0–36.0)
MCV: 100 fL (ref 80.0–100.0)
Monocytes Absolute: 0.7 10*3/uL (ref 0.1–1.0)
Monocytes Relative: 14 %
Neutro Abs: 2.8 10*3/uL (ref 1.7–7.7)
Neutrophils Relative %: 53 %
Platelet Count: 234 10*3/uL (ref 150–400)
RBC: 2.71 MIL/uL — ABNORMAL LOW (ref 3.87–5.11)
RDW: 13.8 % (ref 11.5–15.5)
WBC Count: 5.2 10*3/uL (ref 4.0–10.5)
nRBC: 0 % (ref 0.0–0.2)

## 2020-03-28 LAB — LIPID PANEL
Cholesterol: 186 mg/dL (ref 0–200)
HDL: 56 mg/dL (ref >40)
LDL Cholesterol: 112 mg/dL — ABNORMAL HIGH (ref 0–99)
Total CHOL/HDL Ratio: 3.3 RATIO
Triglycerides: 90 mg/dL (ref <150)
VLDL: 18 mg/dL (ref 0–40)

## 2020-03-28 MED ORDER — SODIUM CHLORIDE 0.9 % IV SOLN
Freq: Once | INTRAVENOUS | Status: AC
  Filled 2020-03-28: qty 250

## 2020-03-28 MED ORDER — SODIUM CHLORIDE 0.9% FLUSH
10.0000 mL | INTRAVENOUS | Status: DC | PRN
  Administered 2020-03-28: 10 mL
  Filled 2020-03-28: qty 10

## 2020-03-28 MED ORDER — SODIUM CHLORIDE 0.9% FLUSH
10.0000 mL | Freq: Once | INTRAVENOUS | Status: DC
  Filled 2020-03-28: qty 10

## 2020-03-28 MED ORDER — DIPHENHYDRAMINE HCL 25 MG PO CAPS
ORAL_CAPSULE | ORAL | Status: AC
  Filled 2020-03-28: qty 1

## 2020-03-28 MED ORDER — TRASTUZUMAB-ANNS CHEMO 150 MG IV SOLR
600.0000 mg | Freq: Once | INTRAVENOUS | Status: AC
  Administered 2020-03-28: 600 mg via INTRAVENOUS
  Filled 2020-03-28: qty 28.57

## 2020-03-28 MED ORDER — ACETAMINOPHEN 325 MG PO TABS
ORAL_TABLET | ORAL | Status: AC
  Filled 2020-03-28: qty 2

## 2020-03-28 MED ORDER — DIPHENHYDRAMINE HCL 25 MG PO CAPS
25.0000 mg | ORAL_CAPSULE | Freq: Once | ORAL | Status: AC
  Administered 2020-03-28: 25 mg via ORAL

## 2020-03-28 MED ORDER — PALONOSETRON HCL INJECTION 0.25 MG/5ML
INTRAVENOUS | Status: AC
  Filled 2020-03-28: qty 5

## 2020-03-28 MED ORDER — ACETAMINOPHEN 325 MG PO TABS
650.0000 mg | ORAL_TABLET | Freq: Once | ORAL | Status: AC
  Administered 2020-03-28: 650 mg via ORAL

## 2020-03-28 MED ORDER — PROCHLORPERAZINE EDISYLATE 10 MG/2ML IJ SOLN
INTRAMUSCULAR | Status: AC
  Filled 2020-03-28: qty 2

## 2020-03-28 MED ORDER — HEPARIN SOD (PORK) LOCK FLUSH 100 UNIT/ML IV SOLN
500.0000 [IU] | Freq: Once | INTRAVENOUS | Status: AC | PRN
  Administered 2020-03-28: 500 [IU]
  Filled 2020-03-28: qty 5

## 2020-03-28 NOTE — Patient Instructions (Signed)
[facility] Discharge Instructions for Patients Receiving Chemotherapy  Today you received the following chemotherapy agents Transtuzumab.  To help prevent nausea and vomiting after your treatment, we encourage you to take your nausea medication    If you develop nausea and vomiting that is not controlled by your nausea medication, call the clinic.   BELOW ARE SYMPTOMS THAT SHOULD BE REPORTED IMMEDIATELY:  *FEVER GREATER THAN 100.5 F  *CHILLS WITH OR WITHOUT FEVER  NAUSEA AND VOMITING THAT IS NOT CONTROLLED WITH YOUR NAUSEA MEDICATION  *UNUSUAL SHORTNESS OF BREATH  *UNUSUAL BRUISING OR BLEEDING  TENDERNESS IN MOUTH AND THROAT WITH OR WITHOUT PRESENCE OF ULCERS  *URINARY PROBLEMS  *BOWEL PROBLEMS  UNUSUAL RASH Items with * indicate a potential emergency and should be followed up as soon as possible.  Feel free to call the clinic should you have any questions or concerns. The clinic phone number is [phone].  Please show the [facility] at check-in to the Emergency Department and triage nurse.

## 2020-03-29 ENCOUNTER — Telehealth: Payer: Self-pay | Admitting: Oncology

## 2020-03-29 NOTE — Telephone Encounter (Signed)
Scheduled appts per 6/10 los. Pt confirmed appt dates and times.

## 2020-04-03 ENCOUNTER — Encounter (HOSPITAL_BASED_OUTPATIENT_CLINIC_OR_DEPARTMENT_OTHER): Payer: Self-pay | Admitting: Surgery

## 2020-04-03 ENCOUNTER — Other Ambulatory Visit: Payer: Self-pay

## 2020-04-04 ENCOUNTER — Other Ambulatory Visit: Payer: Self-pay | Admitting: Oncology

## 2020-04-05 NOTE — Progress Notes (Signed)

## 2020-04-06 ENCOUNTER — Other Ambulatory Visit (HOSPITAL_COMMUNITY)
Admission: RE | Admit: 2020-04-06 | Discharge: 2020-04-06 | Disposition: A | Discharge status: Home / Self Care | Payer: 59 | Source: Ambulatory Visit | Attending: Surgery | Admitting: Surgery

## 2020-04-06 DIAGNOSIS — Z01812 Encounter for preprocedural laboratory examination: Secondary | ICD-10-CM | POA: Diagnosis not present

## 2020-04-06 DIAGNOSIS — Z20822 Contact with and (suspected) exposure to covid-19: Secondary | ICD-10-CM | POA: Insufficient documentation

## 2020-04-06 LAB — SARS CORONAVIRUS 2 (TAT 6-24 HRS): SARS Coronavirus 2: NEGATIVE

## 2020-04-10 ENCOUNTER — Other Ambulatory Visit: Payer: Self-pay

## 2020-04-10 ENCOUNTER — Ambulatory Visit (HOSPITAL_BASED_OUTPATIENT_CLINIC_OR_DEPARTMENT_OTHER): Payer: 59 | Admitting: Certified Registered"

## 2020-04-10 ENCOUNTER — Ambulatory Visit (HOSPITAL_BASED_OUTPATIENT_CLINIC_OR_DEPARTMENT_OTHER)
Admission: RE | Admit: 2020-04-10 | Discharge: 2020-04-10 | Disposition: A | Discharge status: Home / Self Care | Payer: 59 | Attending: Surgery | Admitting: Surgery

## 2020-04-10 ENCOUNTER — Encounter (HOSPITAL_BASED_OUTPATIENT_CLINIC_OR_DEPARTMENT_OTHER): Payer: Self-pay | Admitting: Surgery

## 2020-04-10 ENCOUNTER — Encounter (HOSPITAL_BASED_OUTPATIENT_CLINIC_OR_DEPARTMENT_OTHER)
Admission: RE | Disposition: A | Discharge status: Home / Self Care | Payer: Self-pay | Source: Home / Self Care | Attending: Surgery

## 2020-04-10 DIAGNOSIS — Z170 Estrogen receptor positive status [ER+]: Secondary | ICD-10-CM | POA: Diagnosis not present

## 2020-04-10 DIAGNOSIS — N6321 Unspecified lump in the left breast, upper outer quadrant: Secondary | ICD-10-CM | POA: Diagnosis present

## 2020-04-10 DIAGNOSIS — I10 Essential (primary) hypertension: Secondary | ICD-10-CM | POA: Insufficient documentation

## 2020-04-10 DIAGNOSIS — E119 Type 2 diabetes mellitus without complications: Secondary | ICD-10-CM | POA: Diagnosis not present

## 2020-04-10 DIAGNOSIS — Z8542 Personal history of malignant neoplasm of other parts of uterus: Secondary | ICD-10-CM | POA: Diagnosis not present

## 2020-04-10 DIAGNOSIS — C50412 Malignant neoplasm of upper-outer quadrant of left female breast: Secondary | ICD-10-CM | POA: Insufficient documentation

## 2020-04-10 DIAGNOSIS — Z923 Personal history of irradiation: Secondary | ICD-10-CM | POA: Diagnosis not present

## 2020-04-10 DIAGNOSIS — Z6841 Body Mass Index (BMI) 40.0 and over, adult: Secondary | ICD-10-CM | POA: Insufficient documentation

## 2020-04-10 DIAGNOSIS — Z79899 Other long term (current) drug therapy: Secondary | ICD-10-CM | POA: Diagnosis not present

## 2020-04-10 DIAGNOSIS — Z885 Allergy status to narcotic agent status: Secondary | ICD-10-CM | POA: Diagnosis not present

## 2020-04-10 DIAGNOSIS — K219 Gastro-esophageal reflux disease without esophagitis: Secondary | ICD-10-CM | POA: Diagnosis not present

## 2020-04-10 DIAGNOSIS — G4730 Sleep apnea, unspecified: Secondary | ICD-10-CM | POA: Diagnosis not present

## 2020-04-10 DIAGNOSIS — C773 Secondary and unspecified malignant neoplasm of axilla and upper limb lymph nodes: Secondary | ICD-10-CM | POA: Diagnosis not present

## 2020-04-10 DIAGNOSIS — Z888 Allergy status to other drugs, medicaments and biological substances status: Secondary | ICD-10-CM | POA: Insufficient documentation

## 2020-04-10 DIAGNOSIS — M1990 Unspecified osteoarthritis, unspecified site: Secondary | ICD-10-CM | POA: Diagnosis not present

## 2020-04-10 DIAGNOSIS — Z86000 Personal history of in-situ neoplasm of breast: Secondary | ICD-10-CM | POA: Insufficient documentation

## 2020-04-10 DIAGNOSIS — Z886 Allergy status to analgesic agent status: Secondary | ICD-10-CM | POA: Insufficient documentation

## 2020-04-10 DIAGNOSIS — E7800 Pure hypercholesterolemia, unspecified: Secondary | ICD-10-CM | POA: Insufficient documentation

## 2020-04-10 HISTORY — PX: BREAST LUMPECTOMY WITH RADIOACTIVE SEED AND AXILLARY LYMPH NODE DISSECTION: SHX6656

## 2020-04-10 LAB — POCT HEMOGLOBIN-HEMACUE: Hemoglobin: 8.5 g/dL — ABNORMAL LOW (ref 12.0–15.0)

## 2020-04-10 LAB — GLUCOSE, CAPILLARY: Glucose-Capillary: 113 mg/dL — ABNORMAL HIGH (ref 70–99)

## 2020-04-10 SURGERY — BREAST LUMPECTOMY WITH RADIOACTIVE SEED AND AXILLARY LYMPH NODE DISSECTION
Anesthesia: General | Site: Breast | Laterality: Left

## 2020-04-10 MED ORDER — FENTANYL CITRATE (PF) 100 MCG/2ML IJ SOLN
INTRAMUSCULAR | Status: AC
  Filled 2020-04-10: qty 2

## 2020-04-10 MED ORDER — PROPOFOL 500 MG/50ML IV EMUL
INTRAVENOUS | Status: DC | PRN
Start: 2020-04-10 — End: 2020-04-10
  Administered 2020-04-10: 25 ug/kg/min via INTRAVENOUS

## 2020-04-10 MED ORDER — ONDANSETRON HCL 4 MG/2ML IJ SOLN
INTRAMUSCULAR | Status: DC | PRN
  Administered 2020-04-10: 4 mg via INTRAVENOUS

## 2020-04-10 MED ORDER — GABAPENTIN 300 MG PO CAPS
300.0000 mg | ORAL_CAPSULE | ORAL | Status: AC
  Administered 2020-04-10: 300 mg via ORAL

## 2020-04-10 MED ORDER — ACETAMINOPHEN 500 MG PO TABS
ORAL_TABLET | ORAL | Status: AC
  Filled 2020-04-10: qty 2

## 2020-04-10 MED ORDER — PROPOFOL 10 MG/ML IV BOLUS
INTRAVENOUS | Status: AC
  Filled 2020-04-10: qty 20

## 2020-04-10 MED ORDER — BUPIVACAINE HCL (PF) 0.25 % IJ SOLN
INTRAMUSCULAR | Status: DC | PRN
  Administered 2020-04-10: 20 mL

## 2020-04-10 MED ORDER — ONDANSETRON HCL 4 MG/2ML IJ SOLN: 4.0000 mg | Freq: Once | INTRAMUSCULAR | Status: DC | PRN

## 2020-04-10 MED ORDER — PHENYLEPHRINE HCL (PRESSORS) 10 MG/ML IV SOLN
INTRAVENOUS | Status: DC | PRN
Start: 2020-04-10 — End: 2020-04-10
  Administered 2020-04-10 (×2): 80 ug via INTRAVENOUS

## 2020-04-10 MED ORDER — MEPERIDINE HCL 25 MG/ML IJ SOLN: 6.2500 mg | INTRAMUSCULAR | Status: DC | PRN

## 2020-04-10 MED ORDER — EPHEDRINE 5 MG/ML INJ
INTRAVENOUS | Status: AC
  Filled 2020-04-10: qty 20

## 2020-04-10 MED ORDER — GABAPENTIN 300 MG PO CAPS
ORAL_CAPSULE | ORAL | Status: AC
  Filled 2020-04-10: qty 1

## 2020-04-10 MED ORDER — ONDANSETRON 4 MG PO TBDP
4.0000 mg | ORAL_TABLET | Freq: Three times a day (TID) | ORAL | 0 refills | Status: DC | PRN
Start: 2020-04-10 — End: 2020-07-02

## 2020-04-10 MED ORDER — FENTANYL CITRATE (PF) 100 MCG/2ML IJ SOLN
100.0000 ug | Freq: Once | INTRAMUSCULAR | Status: AC
  Administered 2020-04-10: 100 ug via INTRAVENOUS

## 2020-04-10 MED ORDER — SODIUM CHLORIDE (PF) 0.9 % IJ SOLN
INTRAMUSCULAR | Status: AC
  Filled 2020-04-10: qty 10

## 2020-04-10 MED ORDER — DEXAMETHASONE SODIUM PHOSPHATE 4 MG/ML IJ SOLN
INTRAMUSCULAR | Status: DC | PRN
  Administered 2020-04-10: 4 mg via INTRAVENOUS

## 2020-04-10 MED ORDER — MIDAZOLAM HCL 2 MG/2ML IJ SOLN
2.0000 mg | Freq: Once | INTRAMUSCULAR | Status: AC
  Administered 2020-04-10: 2 mg via INTRAVENOUS

## 2020-04-10 MED ORDER — FENTANYL CITRATE (PF) 100 MCG/2ML IJ SOLN
25.0000 ug | INTRAMUSCULAR | Status: DC | PRN
  Administered 2020-04-10: 25 ug via INTRAVENOUS

## 2020-04-10 MED ORDER — PROPOFOL 10 MG/ML IV BOLUS
INTRAVENOUS | Status: DC | PRN
  Administered 2020-04-10: 200 mg via INTRAVENOUS

## 2020-04-10 MED ORDER — CHLORHEXIDINE GLUCONATE CLOTH 2 % EX PADS: 6.0000 | MEDICATED_PAD | Freq: Once | CUTANEOUS | Status: DC

## 2020-04-10 MED ORDER — LIDOCAINE HCL (CARDIAC) PF 100 MG/5ML IV SOSY
PREFILLED_SYRINGE | INTRAVENOUS | Status: DC | PRN
  Administered 2020-04-10: 30 mg via INTRAVENOUS

## 2020-04-10 MED ORDER — LACTATED RINGERS IV SOLN: INTRAVENOUS | Status: DC

## 2020-04-10 MED ORDER — PHENYLEPHRINE 40 MCG/ML (10ML) SYRINGE FOR IV PUSH (FOR BLOOD PRESSURE SUPPORT)
PREFILLED_SYRINGE | INTRAVENOUS | Status: AC
  Filled 2020-04-10: qty 20

## 2020-04-10 MED ORDER — BUPIVACAINE HCL (PF) 0.5 % IJ SOLN
INTRAMUSCULAR | Status: AC
  Filled 2020-04-10: qty 30

## 2020-04-10 MED ORDER — CEFAZOLIN SODIUM-DEXTROSE 2-4 GM/100ML-% IV SOLN
INTRAVENOUS | Status: AC
  Filled 2020-04-10: qty 100

## 2020-04-10 MED ORDER — CEFAZOLIN SODIUM-DEXTROSE 2-4 GM/100ML-% IV SOLN
2.0000 g | INTRAVENOUS | Status: AC
  Administered 2020-04-10: 2 g via INTRAVENOUS

## 2020-04-10 MED ORDER — BUPIVACAINE-EPINEPHRINE (PF) 0.5% -1:200000 IJ SOLN
INTRAMUSCULAR | Status: DC | PRN
  Administered 2020-04-10: 30 mL

## 2020-04-10 MED ORDER — ACETAMINOPHEN 500 MG PO TABS
1000.0000 mg | ORAL_TABLET | ORAL | Status: AC
  Administered 2020-04-10: 1000 mg via ORAL

## 2020-04-10 MED ORDER — CLONIDINE HCL (ANALGESIA) 100 MCG/ML EP SOLN
EPIDURAL | Status: DC | PRN
Start: 2020-04-10 — End: 2020-04-10
  Administered 2020-04-10: 100 ug

## 2020-04-10 MED ORDER — FENTANYL CITRATE (PF) 100 MCG/2ML IJ SOLN
INTRAMUSCULAR | Status: DC | PRN
  Administered 2020-04-10: 25 ug via INTRAVENOUS

## 2020-04-10 MED ORDER — MIDAZOLAM HCL 2 MG/2ML IJ SOLN
INTRAMUSCULAR | Status: AC
  Filled 2020-04-10: qty 2

## 2020-04-10 MED ORDER — METHYLENE BLUE 0.5 % INJ SOLN
INTRAVENOUS | Status: AC
  Filled 2020-04-10: qty 10

## 2020-04-10 MED ORDER — OXYCODONE HCL 5 MG PO TABS: 5.0000 mg | ORAL_TABLET | Freq: Four times a day (QID) | ORAL | 0 refills | Status: DC | PRN

## 2020-04-10 SURGICAL SUPPLY — 62 items
APL PRP STRL LF DISP 70% ISPRP (MISCELLANEOUS) ×1
APL SKNCLS STERI-STRIP NONHPOA (GAUZE/BANDAGES/DRESSINGS) ×1
APPLIER CLIP 9.375 MED OPEN (MISCELLANEOUS) ×2
APR CLP MED 9.3 20 MLT OPN (MISCELLANEOUS) ×1
BENZOIN TINCTURE PRP APPL 2/3 (GAUZE/BANDAGES/DRESSINGS) ×2 IMPLANT
BINDER BREAST 3XL (GAUZE/BANDAGES/DRESSINGS) IMPLANT
BINDER BREAST LRG (GAUZE/BANDAGES/DRESSINGS) IMPLANT
BINDER BREAST MEDIUM (GAUZE/BANDAGES/DRESSINGS) IMPLANT
BINDER BREAST XLRG (GAUZE/BANDAGES/DRESSINGS) IMPLANT
BINDER BREAST XXLRG (GAUZE/BANDAGES/DRESSINGS) IMPLANT
BLADE CLIPPER SURG (BLADE) IMPLANT
BLADE HEX COATED 2.75 (ELECTRODE) ×2 IMPLANT
BLADE SURG 10 STRL SS (BLADE) IMPLANT
BLADE SURG 15 STRL LF DISP TIS (BLADE) ×1 IMPLANT
BLADE SURG 15 STRL SS (BLADE) ×2
CANISTER SUC SOCK COL 7IN (MISCELLANEOUS) ×2 IMPLANT
CANISTER SUCT 1200ML W/VALVE (MISCELLANEOUS) ×1 IMPLANT
CHLORAPREP W/TINT 26 (MISCELLANEOUS) ×2 IMPLANT
CLIP APPLIE 9.375 MED OPEN (MISCELLANEOUS) IMPLANT
COVER BACK TABLE 60X90IN (DRAPES) ×2 IMPLANT
COVER MAYO STAND STRL (DRAPES) ×2 IMPLANT
COVER PROBE W GEL 5X96 (DRAPES) ×2 IMPLANT
COVER WAND RF STERILE (DRAPES) IMPLANT
DECANTER SPIKE VIAL GLASS SM (MISCELLANEOUS) ×1 IMPLANT
DRAPE LAPAROSCOPIC ABDOMINAL (DRAPES) ×2 IMPLANT
DRAPE UTILITY XL STRL (DRAPES) ×2 IMPLANT
DRSG TEGADERM 4X4.75 (GAUZE/BANDAGES/DRESSINGS) ×4 IMPLANT
ELECT REM PT RETURN 9FT ADLT (ELECTROSURGICAL) ×2
ELECTRODE REM PT RTRN 9FT ADLT (ELECTROSURGICAL) ×1 IMPLANT
GAUZE SPONGE 4X4 12PLY STRL LF (GAUZE/BANDAGES/DRESSINGS) IMPLANT
GLOVE BIO SURGEON STRL SZ 6.5 (GLOVE) ×2 IMPLANT
GLOVE BIO SURGEON STRL SZ7 (GLOVE) ×2 IMPLANT
GLOVE BIOGEL PI IND STRL 7.0 (GLOVE) IMPLANT
GLOVE BIOGEL PI IND STRL 7.5 (GLOVE) ×1 IMPLANT
GLOVE BIOGEL PI INDICATOR 7.0 (GLOVE) ×2
GLOVE BIOGEL PI INDICATOR 7.5 (GLOVE) ×1
GOWN STRL REUS W/ TWL LRG LVL3 (GOWN DISPOSABLE) ×2 IMPLANT
GOWN STRL REUS W/TWL LRG LVL3 (GOWN DISPOSABLE) ×6
ILLUMINATOR WAVEGUIDE N/F (MISCELLANEOUS) IMPLANT
KIT MARKER MARGIN INK (KITS) ×2 IMPLANT
LIGHT WAVEGUIDE WIDE FLAT (MISCELLANEOUS) IMPLANT
NDL HYPO 25X1 1.5 SAFETY (NEEDLE) ×2 IMPLANT
NDL SAFETY ECLIPSE 18X1.5 (NEEDLE) ×1 IMPLANT
NEEDLE HYPO 18GX1.5 SHARP (NEEDLE) ×2
NEEDLE HYPO 25X1 1.5 SAFETY (NEEDLE) ×4 IMPLANT
NS IRRIG 1000ML POUR BTL (IV SOLUTION) IMPLANT
PENCIL SMOKE EVACUATOR (MISCELLANEOUS) ×2 IMPLANT
SET BASIN DAY SURGERY F.S. (CUSTOM PROCEDURE TRAY) ×2 IMPLANT
SLEEVE SCD COMPRESS KNEE MED (MISCELLANEOUS) ×2 IMPLANT
SPONGE LAP 18X18 RF (DISPOSABLE) IMPLANT
SPONGE LAP 4X18 RFD (DISPOSABLE) ×3 IMPLANT
STRIP CLOSURE SKIN 1/2X4 (GAUZE/BANDAGES/DRESSINGS) ×2 IMPLANT
SUT MON AB 4-0 PC3 18 (SUTURE) ×2 IMPLANT
SUT SILK 2 0 SH (SUTURE) IMPLANT
SUT VIC AB 3-0 SH 27 (SUTURE) ×2
SUT VIC AB 3-0 SH 27X BRD (SUTURE) ×1 IMPLANT
SYR BULB EAR ULCER 3OZ GRN STR (SYRINGE) ×2 IMPLANT
SYR CONTROL 10ML LL (SYRINGE) ×3 IMPLANT
TOWEL GREEN STERILE FF (TOWEL DISPOSABLE) ×2 IMPLANT
TRAY FAXITRON CT DISP (TRAY / TRAY PROCEDURE) ×2 IMPLANT
TUBE CONNECTING 20X1/4 (TUBING) ×2 IMPLANT
YANKAUER SUCT BULB TIP NO VENT (SUCTIONS) ×1 IMPLANT

## 2020-04-10 NOTE — Transfer of Care (Signed)
Immediate Anesthesia Transfer of Care Note  Patient: [name]  Procedure(s) Performed: LEFT BREAST LUMPECTOMY WITH RADIOACTIVE SEED AND TARGETED AXILLARY LYMPH NODE DISSECTION (Left Breast)  Patient Location: PACU  Anesthesia Type:GA combined with regional for post-op pain  Level of Consciousness: drowsy and patient cooperative  Airway & Oxygen Therapy: Patient Spontanous Breathing and Patient connected to face mask oxygen  Post-op Assessment: Report given to RN and Post -op Vital signs reviewed and stable  Post vital signs: Reviewed and stable  Last Vitals:  Vitals Value Taken Time  BP    Temp    Pulse    Resp    SpO2      Last Pain:  Vitals:   04/10/20 1134  TempSrc: Oral  PainSc: 3       Patients Stated Pain Goal: 3 (87/68/11 5726)  Complications: No complications documented.

## 2020-04-10 NOTE — Anesthesia Procedure Notes (Signed)
Anesthesia Regional Block: Pectoralis block   Pre-Anesthetic Checklist: ,, timeout performed, Correct Patient, Correct Site, Correct Laterality, Correct Procedure, Correct Position, site marked, Risks and benefits discussed,  Surgical consent,  Pre-op evaluation,  At surgeon's request and post-op pain management  Laterality: Left  Prep: chloraprep       Needles:   Needle Type: Stimiplex     Needle Length: 9cm      Additional Needles:   Narrative:  Start time: 04/10/2020 1:00 PM End time: 04/10/2020 1:10 PM Injection made incrementally with aspirations every 5 mL.  Performed by: Personally  Anesthesiologist: Nolon Nations, MD  Additional Notes: Came to do block, but patient needed to use bathroom. Came back, no meds were drawn up for the patient. Then, IV was inoperable and I had to wait for the RN to trouble shoot. Otherwise, patient tolerated well. Good fascial spread noted.

## 2020-04-10 NOTE — Anesthesia Postprocedure Evaluation (Signed)
Anesthesia Post Note  Patient: [name]  Procedure(s) Performed: LEFT BREAST LUMPECTOMY WITH RADIOACTIVE SEED AND TARGETED AXILLARY LYMPH NODE DISSECTION (Left Breast)     Patient location during evaluation: PACU Anesthesia Type: General Level of consciousness: sedated and patient cooperative Pain management: pain level controlled Vital Signs Assessment: post-procedure vital signs reviewed and stable Respiratory status: spontaneous breathing Cardiovascular status: stable Anesthetic complications: no   No complications documented.  Last Vitals:  Vitals:   04/10/20 1530 04/10/20 1545  BP: (!) 141/82 (!) 149/90  Pulse: 80 72  Resp: 15 13  Temp:    SpO2: 100% 100%    Last Pain:  Vitals:   04/10/20 1530  TempSrc:   PainSc: 0-No pain                 Nolon Nations

## 2020-04-10 NOTE — Progress Notes (Signed)
Assisted Dr. Germeroth with left, ultrasound guided, pectoralis block. Side rails up, monitors on throughout procedure. See vital signs in flow sheet. Tolerated Procedure well. 
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
## 2020-04-10 NOTE — Discharge Instructions (Signed)
[name] Office Phone Number [phone]  BREAST BIOPSY/ PARTIAL MASTECTOMY: POST OP INSTRUCTIONS  Always review your discharge instruction sheet given to you by the facility where your surgery was performed.  IF YOU HAVE DISABILITY OR FAMILY LEAVE FORMS, YOU MUST BRING THEM TO THE OFFICE FOR PROCESSING.  DO NOT GIVE THEM TO YOUR DOCTOR.  1. A prescription for pain medication may be given to you upon discharge.  Take your pain medication as prescribed, if needed.  If narcotic pain medicine is not needed, then you may take acetaminophen (Tylenol) or ibuprofen (Advil) as needed. 2. Take your usually prescribed medications unless otherwise directed 3. If you need a refill on your pain medication, please contact your pharmacy.  They will contact our office to request authorization.  Prescriptions will not be filled after 5pm or on week-ends. 4. You should eat very light the first 24 hours after surgery, such as soup, crackers, pudding, etc.  Resume your normal diet the day after surgery. 5. Most patients will experience some swelling and bruising in the breast.  Ice packs and a good support bra will help.  Swelling and bruising can take several days to resolve.  6. It is common to experience some constipation if taking pain medication after surgery.  Increasing fluid intake and taking a stool softener will usually help or prevent this problem from occurring.  A mild laxative (Milk of Magnesia or Miralax) should be taken according to package directions if there are no bowel movements after 48 hours. 7. Unless discharge instructions indicate otherwise, you may remove your bandages 24-48 hours after surgery, and you may shower at that time.  You may have steri-strips (small skin tapes) in place directly over the incision.  These strips should be left on the skin for 7-10 days.  If your surgeon used skin glue on the incision, you may shower in 24 hours.  The glue will flake off over the  next 2-3 weeks.  Any sutures or staples will be removed at the office during your follow-up visit. 8. ACTIVITIES:  You may resume regular daily activities (gradually increasing) beginning the next day.  Wearing a good support bra or sports bra minimizes pain and swelling.  You may have sexual intercourse when it is comfortable. a. You may drive when you no longer are taking prescription pain medication, you can comfortably wear a seatbelt, and you can safely maneuver your car and apply brakes. b. RETURN TO WORK:  ______________________________________________________________________________________ 9. You should see your doctor in the office for a follow-up appointment approximately two weeks after your surgery.  Your doctor's nurse will typically make your follow-up appointment when she calls you with your pathology report.  Expect your pathology report 2-3 business days after your surgery.  You may call to check if you do not hear from [place] after three days. 10. OTHER INSTRUCTIONS: _______________________________________________________________________________________________ _____________________________________________________________________________________________________________________________________ _____________________________________________________________________________________________________________________________________ _____________________________________________________________________________________________________________________________________  WHEN TO CALL YOUR DOCTOR: 1. Fever over 101.0 2. Nausea and/or vomiting. 3. Extreme swelling or bruising. 4. Continued bleeding from incision. 5. Increased pain, redness, or drainage from the incision.  The clinic staff is available to answer your questions during regular business hours.  Please don't hesitate to call and ask to speak to one of the nurses for clinical concerns.  If you have a medical emergency, go to the nearest  emergency room or call 911.  A surgeon from [facility] is always on call at the hospital.  For further questions, please visit [email or web address]  Post Anesthesia Home Care Instructions  Activity: Get plenty of rest for the remainder of the day. A responsible individual must stay with you for 24 hours following the procedure.  For the next 24 hours, DO NOT: -Drive a car -Paediatric nurse -Drink alcoholic beverages -Take any medication unless instructed by your physician -Make any legal decisions or sign important papers.  Meals: Start with liquid foods such as gelatin or soup. Progress to regular foods as tolerated. Avoid greasy, spicy, heavy foods. If nausea and/or vomiting occur, drink only clear liquids until the nausea and/or vomiting subsides. Call your physician if vomiting continues.  Special Instructions/Symptoms: Your throat may feel dry or sore from the anesthesia or the breathing tube placed in your throat during surgery. If this causes discomfort, gargle with warm salt water. The discomfort should disappear within 24 hours.  *May take Tylenol at 6pm today 04/10/2020
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
## 2020-04-10 NOTE — Anesthesia Preprocedure Evaluation (Addendum)
Anesthesia Evaluation  Patient identified by MRN, date of birth, ID band Patient awake    Reviewed: Allergy & Precautions, NPO status , Patient's Chart, lab work & pertinent test results  History of Anesthesia Complications Negative for: history of anesthetic complications  Airway Mallampati: II  TM Distance: >3 FB Neck ROM: Full    Dental  (+) Teeth Intact   Pulmonary sleep apnea ,    Pulmonary exam normal        Cardiovascular hypertension, negative cardio ROS Normal cardiovascular exam     Neuro/Psych PSYCHIATRIC DISORDERS Anxiety Depression negative neurological ROS     GI/Hepatic Neg liver ROS, hiatal hernia, GERD  ,  Endo/Other  diabetesMorbid obesity  Renal/GU negative Renal ROS  negative genitourinary   Musculoskeletal negative musculoskeletal ROS (+)   Abdominal (+) + obese,   Peds  Hematology negative hematology ROS (+)   Anesthesia Other Findings Metastatic breast cancer  Reproductive/Obstetrics                             Anesthesia Physical  Anesthesia Plan  ASA: III  Anesthesia Plan: General   Post-op Pain Management: GA combined w/ Regional for post-op pain   Induction: Intravenous  PONV Risk Score and Plan: 3 and Ondansetron, Dexamethasone, Treatment may vary due to age or medical condition and Midazolam  Airway Management Planned: LMA  Additional Equipment: None  Intra-op Plan:   Post-operative Plan: Extubation in OR  Informed Consent: I have reviewed the patients History and Physical, chart, labs and discussed the procedure including the risks, benefits and alternatives for the proposed anesthesia with the patient or authorized representative who has indicated his/her understanding and acceptance.     Dental advisory given  Plan Discussed with:   Anesthesia Plan Comments:        Anesthesia Quick Evaluation

## 2020-04-10 NOTE — Anesthesia Procedure Notes (Signed)
Procedure Name: LMA Insertion Date/Time: 04/10/2020 1:27 PM Performed by: Signe Colt, CRNA Pre-anesthesia Checklist: Patient identified, Emergency Drugs available, Suction available and Patient being monitored Patient Re-evaluated:Patient Re-evaluated prior to induction Oxygen Delivery Method: Circle system utilized Preoxygenation: Pre-oxygenation with 100% oxygen Induction Type: IV induction Ventilation: Mask ventilation without difficulty LMA: LMA inserted LMA Size: 4.0 Number of attempts: 1 Airway Equipment and Method: Bite block Placement Confirmation: positive ETCO2 Tube secured with: Tape Dental Injury: Teeth and Oropharynx as per pre-operative assessment

## 2020-04-11 ENCOUNTER — Encounter (HOSPITAL_BASED_OUTPATIENT_CLINIC_OR_DEPARTMENT_OTHER): Payer: Self-pay | Admitting: Surgery

## 2020-04-17 ENCOUNTER — Telehealth: Payer: Self-pay | Admitting: *Deleted

## 2020-04-17 NOTE — Telephone Encounter (Signed)
Connected with patient regarding records request.  [name] Reports "[place] with [employer] staff have assisted with this request and will fax information to the state.  Thank you for your help.  I was approved for [employer]. Disability."    "[name] [phone]).  Heard back from the the state [place] because no medical information was sent to them.  They are asking for any test, CT scans, notes and more from the last six months.  Not sure if the information I received April 01, 2020 constitutes what they need however requesting current records the past six months.  Call to let me know."

## 2020-04-18 ENCOUNTER — Inpatient Hospital Stay (HOSPITAL_BASED_OUTPATIENT_CLINIC_OR_DEPARTMENT_OTHER): Payer: 59 | Admitting: Adult Health

## 2020-04-18 ENCOUNTER — Inpatient Hospital Stay: Payer: 59 | Attending: Oncology

## 2020-04-18 ENCOUNTER — Inpatient Hospital Stay: Payer: 59

## 2020-04-18 ENCOUNTER — Other Ambulatory Visit: Payer: Self-pay

## 2020-04-18 ENCOUNTER — Encounter: Payer: Self-pay | Admitting: Adult Health

## 2020-04-18 VITALS — BP 113/70 | HR 77 | Temp 98.3°F | Resp 18 | Ht <= 58 in | Wt 219.1 lb

## 2020-04-18 DIAGNOSIS — E119 Type 2 diabetes mellitus without complications: Secondary | ICD-10-CM | POA: Insufficient documentation

## 2020-04-18 DIAGNOSIS — Z8542 Personal history of malignant neoplasm of other parts of uterus: Secondary | ICD-10-CM | POA: Diagnosis not present

## 2020-04-18 DIAGNOSIS — C50112 Malignant neoplasm of central portion of left female breast: Secondary | ICD-10-CM

## 2020-04-18 DIAGNOSIS — Z923 Personal history of irradiation: Secondary | ICD-10-CM | POA: Insufficient documentation

## 2020-04-18 DIAGNOSIS — I509 Heart failure, unspecified: Secondary | ICD-10-CM | POA: Diagnosis not present

## 2020-04-18 DIAGNOSIS — Z5112 Encounter for antineoplastic immunotherapy: Secondary | ICD-10-CM | POA: Diagnosis present

## 2020-04-18 DIAGNOSIS — Z8249 Family history of ischemic heart disease and other diseases of the circulatory system: Secondary | ICD-10-CM | POA: Insufficient documentation

## 2020-04-18 DIAGNOSIS — Z171 Estrogen receptor negative status [ER-]: Secondary | ICD-10-CM

## 2020-04-18 DIAGNOSIS — Z833 Family history of diabetes mellitus: Secondary | ICD-10-CM | POA: Insufficient documentation

## 2020-04-18 DIAGNOSIS — Z8349 Family history of other endocrine, nutritional and metabolic diseases: Secondary | ICD-10-CM | POA: Diagnosis not present

## 2020-04-18 DIAGNOSIS — I110 Hypertensive heart disease with heart failure: Secondary | ICD-10-CM | POA: Insufficient documentation

## 2020-04-18 DIAGNOSIS — Z95828 Presence of other vascular implants and grafts: Secondary | ICD-10-CM

## 2020-04-18 DIAGNOSIS — C50912 Malignant neoplasm of unspecified site of left female breast: Secondary | ICD-10-CM | POA: Diagnosis present

## 2020-04-18 DIAGNOSIS — Z9079 Acquired absence of other genital organ(s): Secondary | ICD-10-CM | POA: Diagnosis not present

## 2020-04-18 DIAGNOSIS — Z90722 Acquired absence of ovaries, bilateral: Secondary | ICD-10-CM | POA: Diagnosis not present

## 2020-04-18 DIAGNOSIS — C50812 Malignant neoplasm of overlapping sites of left female breast: Secondary | ICD-10-CM

## 2020-04-18 DIAGNOSIS — Z803 Family history of malignant neoplasm of breast: Secondary | ICD-10-CM | POA: Insufficient documentation

## 2020-04-18 DIAGNOSIS — E785 Hyperlipidemia, unspecified: Secondary | ICD-10-CM | POA: Diagnosis not present

## 2020-04-18 DIAGNOSIS — Z79899 Other long term (current) drug therapy: Secondary | ICD-10-CM | POA: Insufficient documentation

## 2020-04-18 DIAGNOSIS — G620 Drug-induced polyneuropathy: Secondary | ICD-10-CM | POA: Diagnosis not present

## 2020-04-18 DIAGNOSIS — C541 Malignant neoplasm of endometrium: Secondary | ICD-10-CM | POA: Diagnosis not present

## 2020-04-18 DIAGNOSIS — Z90710 Acquired absence of both cervix and uterus: Secondary | ICD-10-CM | POA: Diagnosis not present

## 2020-04-18 DIAGNOSIS — Z801 Family history of malignant neoplasm of trachea, bronchus and lung: Secondary | ICD-10-CM | POA: Diagnosis not present

## 2020-04-18 DIAGNOSIS — C773 Secondary and unspecified malignant neoplasm of axilla and upper limb lymph nodes: Secondary | ICD-10-CM | POA: Insufficient documentation

## 2020-04-18 LAB — CBC WITH DIFFERENTIAL (CANCER CENTER ONLY)
Abs Immature Granulocytes: 0.03 10*3/uL (ref 0.00–0.07)
Basophils Absolute: 0 10*3/uL (ref 0.0–0.1)
Basophils Relative: 0 %
Eosinophils Absolute: 0.1 10*3/uL (ref 0.0–0.5)
Eosinophils Relative: 1 %
HCT: 26.9 % — ABNORMAL LOW (ref 36.0–46.0)
Hemoglobin: 8.6 g/dL — ABNORMAL LOW (ref 12.0–15.0)
Immature Granulocytes: 0 %
Lymphocytes Relative: 18 %
Lymphs Abs: 1.3 10*3/uL (ref 0.7–4.0)
MCH: 31.4 pg (ref 26.0–34.0)
MCHC: 32 g/dL (ref 30.0–36.0)
MCV: 98.2 fL (ref 80.0–100.0)
Monocytes Absolute: 1 10*3/uL (ref 0.1–1.0)
Monocytes Relative: 14 %
Neutro Abs: 4.6 10*3/uL (ref 1.7–7.7)
Neutrophils Relative %: 67 %
Platelet Count: 248 10*3/uL (ref 150–400)
RBC: 2.74 MIL/uL — ABNORMAL LOW (ref 3.87–5.11)
RDW: 13 % (ref 11.5–15.5)
WBC Count: 7 10*3/uL (ref 4.0–10.5)
nRBC: 0 % (ref 0.0–0.2)

## 2020-04-18 LAB — CMP (CANCER CENTER ONLY)
ALT: 13 U/L (ref 0–44)
AST: 12 U/L — ABNORMAL LOW (ref 15–41)
Albumin: 2.9 g/dL — ABNORMAL LOW (ref 3.5–5.0)
Alkaline Phosphatase: 52 U/L (ref 38–126)
Anion gap: 11 (ref 5–15)
BUN: 14 mg/dL (ref 8–23)
CO2: 22 mmol/L (ref 22–32)
Calcium: 9.4 mg/dL (ref 8.9–10.3)
Chloride: 101 mmol/L (ref 98–111)
Creatinine: 1.09 mg/dL — ABNORMAL HIGH (ref 0.44–1.00)
GFR, Est AFR Am: >60 mL/min (ref >60)
GFR, Estimated: 55 mL/min — ABNORMAL LOW (ref >60)
Glucose, Bld: 141 mg/dL — ABNORMAL HIGH (ref 70–99)
Potassium: 3.7 mmol/L (ref 3.5–5.1)
Sodium: 134 mmol/L — ABNORMAL LOW (ref 135–145)
Total Bilirubin: 0.5 mg/dL (ref 0.3–1.2)
Total Protein: 9.5 g/dL — ABNORMAL HIGH (ref 6.5–8.1)

## 2020-04-18 LAB — FERRITIN: Ferritin: 491 ng/mL — ABNORMAL HIGH (ref 11–307)

## 2020-04-18 LAB — IRON AND TIBC
Iron: 27 ug/dL — ABNORMAL LOW (ref 41–142)
Saturation Ratios: 12 % — ABNORMAL LOW (ref 21–57)
TIBC: 223 ug/dL — ABNORMAL LOW (ref 236–444)
UIBC: 196 ug/dL (ref 120–384)

## 2020-04-18 LAB — RETICULOCYTES
Immature Retic Fract: 24.9 % — ABNORMAL HIGH (ref 2.3–15.9)
RBC.: 2.9 MIL/uL — ABNORMAL LOW (ref 3.87–5.11)
Retic Count, Absolute: 49.6 10*3/uL (ref 19.0–186.0)
Retic Ct Pct: 1.7 % (ref 0.4–3.1)

## 2020-04-18 LAB — VITAMIN B12: Vitamin B-12: 498 pg/mL (ref 180–914)

## 2020-04-18 LAB — FOLATE: Folate: 9.7 ng/mL (ref >5.9)

## 2020-04-18 MED ORDER — TRASTUZUMAB-ANNS CHEMO 150 MG IV SOLR
600.0000 mg | Freq: Once | INTRAVENOUS | Status: AC
  Administered 2020-04-18: 600 mg via INTRAVENOUS
  Filled 2020-04-18: qty 28.57

## 2020-04-18 MED ORDER — ACETAMINOPHEN 325 MG PO TABS
650.0000 mg | ORAL_TABLET | Freq: Once | ORAL | Status: AC
  Administered 2020-04-18: 650 mg via ORAL

## 2020-04-18 MED ORDER — ACETAMINOPHEN 325 MG PO TABS
ORAL_TABLET | ORAL | Status: AC
  Filled 2020-04-18: qty 2

## 2020-04-18 MED ORDER — DIPHENHYDRAMINE HCL 25 MG PO CAPS
25.0000 mg | ORAL_CAPSULE | Freq: Once | ORAL | Status: AC
  Administered 2020-04-18: 25 mg via ORAL

## 2020-04-18 MED ORDER — HEPARIN SOD (PORK) LOCK FLUSH 100 UNIT/ML IV SOLN
500.0000 [IU] | Freq: Once | INTRAVENOUS | Status: AC | PRN
  Administered 2020-04-18: 500 [IU]
  Filled 2020-04-18: qty 5

## 2020-04-18 MED ORDER — SODIUM CHLORIDE 0.9% FLUSH
10.0000 mL | INTRAVENOUS | Status: DC | PRN
  Administered 2020-04-18: 10 mL
  Filled 2020-04-18: qty 10

## 2020-04-18 MED ORDER — SODIUM CHLORIDE 0.9 % IV SOLN
Freq: Once | INTRAVENOUS | Status: AC
  Filled 2020-04-18: qty 250

## 2020-04-18 MED ORDER — DIPHENHYDRAMINE HCL 25 MG PO CAPS
ORAL_CAPSULE | ORAL | Status: AC
  Filled 2020-04-18: qty 1

## 2020-04-18 MED ORDER — SODIUM CHLORIDE 0.9% FLUSH
10.0000 mL | Freq: Once | INTRAVENOUS | Status: AC
  Administered 2020-04-18: 10 mL
  Filled 2020-04-18: qty 10

## 2020-04-18 NOTE — Patient Instructions (Signed)
[facility] Discharge Instructions for Patients Receiving Chemotherapy  Today you received the following chemotherapy agents Transtuzumab.  To help prevent nausea and vomiting after your treatment, we encourage you to take your nausea medication    If you develop nausea and vomiting that is not controlled by your nausea medication, call the clinic.   BELOW ARE SYMPTOMS THAT SHOULD BE REPORTED IMMEDIATELY:  *FEVER GREATER THAN 100.5 F  *CHILLS WITH OR WITHOUT FEVER  NAUSEA AND VOMITING THAT IS NOT CONTROLLED WITH YOUR NAUSEA MEDICATION  *UNUSUAL SHORTNESS OF BREATH  *UNUSUAL BRUISING OR BLEEDING  TENDERNESS IN MOUTH AND THROAT WITH OR WITHOUT PRESENCE OF ULCERS  *URINARY PROBLEMS  *BOWEL PROBLEMS  UNUSUAL RASH Items with * indicate a potential emergency and should be followed up as soon as possible.  Feel free to call the clinic should you have any questions or concerns. The clinic phone number is [phone].  Please show the [place] at check-in to the Emergency Department and triage nurse.

## 2020-04-18 NOTE — Progress Notes (Addendum)
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, grade 3, estrogen and progesterone receptor negative  (9) definitive surgery delayed (see discussion in 11/03/2019 note)  (10) genetics testing 06/14/2019 through the Multi-Gene Panel offered by [facility] found no deleterious mutations in AIP, ALK, APC, ATM, AXIN2,BAP1,  BARD1, BLM, BMPR1A, BRCA1, BRCA2, BRIP1, CASR, CDC73, CDH1, CDK4, CDKN1B, CDKN1C, CDKN2A (p14ARF), CDKN2A (p16INK4a), CEBPA, CHEK2, CTNNA1, DICER1, DIS3L2, EGFR (c.2369C>T, p.Thr790Met variant only), EPCAM (Deletion/duplication testing only), FH, FLCN, GATA2, GPC3, GREM1 (Promoter region deletion/duplication testing only), HOXB13 (c.251G>A, p.Gly84Glu), HRAS, KIT, MAX, MEN1, MET, MITF (c.952G>A, p.Glu318Lys variant only), MLH1, MSH2, MSH3, MSH6, MUTYH, NBN, NF1, NF2, NTHL1, PALB2, PDGFRA, PHOX2B, PMS2, POLD1, POLE, POT1, PRKAR1A, PTCH1, PTEN, RAD50, RAD51C, RAD51D, RB1, RECQL4, RET, RNF43, RUNX1, SDHAF2, SDHA (sequence changes only), SDHB, SDHC, SDHD, SMAD4, SMARCA4, SMARCB1, SMARCE1, STK11, SUFU, TERC, TERT, TMEM127, TP53, TSC1, TSC2, VHL, WRN and WT1.    C:  METASTATIC BREAST CANCER: JAN 2021 (11) left axillary lymph node biopsy 10/30/2019 documents invasive mammary carcinoma, grade 3, estrogen receptor positive (80%, weak), progesterone receptor negative, HER-2 amplified (3+) MIB-70%  (a) breast MRI 11/23/2019 shows 3.6 cm non-mass-like enhancement in the left breast, with a second more clumped area measuring 4.8 cm, and at least 5 morphologically abnormal left axillary lymph node.  There is a left subpectoral lymph node and a left internal mammary lymph node noted as well.  (b) Chest CT W/C and bone scan 11/13/2019 show prevascular adenopathy (stage IV), no lung, liver or bone metastases; left breast mass and regional nodes  (c) PET 11/20/2019 shows prevascular node SUV of 22, bilateral paratracheal nodes with SUV 6-7  (12) neoadjuvant chemotherapy consisting of trastuzumab (Ogivri), Pertuzumab, carboplatin, docetaxel every 21 days x 6, started 11/21/2019  (a) docetaxel changed to gemcitabine after the first dose  because of neuropathy  (b) pertuzumab held with cycle 2 because of persistent diarrhea  (c) anti-HER2 therapy held after cycle 4 because of a drop in EF  (d) carbo/gemzar stopped after cycle 5, last dose 02/13/2020  (13) anti-HER-2 treatment to be resumed after surgery and continued indefinitely  (a) echo 11/09/2019 shows an ejection fraction in the 60-65% range  (b) echo 02/09/2020 shows an ejection fraction in the 45 to 50% range  (c) echo 03/26/2020 shows an ejection fraction in the 55-60% range  (d) trastuzumab resumed 03/28/2020  (14) left breast lumpectomy and axillary node dissection on 04/10/2020 showed no residual carcinoma in the breast and 1/2 lymph nodes positive for cancer.    (15) adjuvant radiation to follow   PLAN: Naylah is here for f/u of her metastatic bresat cancer.  She continues on treatment with Trastuzumab, and is tolerating it well.  She and I reviewed the fact that it is an immunotherapy, and I reviewed how the  Trastuzumab works.  She is understandably concerned about the effect on the heart, and her next echo is 05/06/2020.  I reviewed with her that Trastuzumab is the best treatment we have to offer in her situation, and that is why we are following her closely with cardiology input so that she can continue on this treatment.    She is also concerned about her surgical results with the positive lymph node.  I reviewed that we will refer to radiation to see if they can treat the area with radiation, and also she will continue on the immunotherapy indefinitely.  I reached out to our Breast Navigator, Iris Pert, to ensure that she is going to be discussed at conference next week.    Alette is in discomfort in her breast and arm.  I reviewed with her that this surgery and the breast/axilla healing, will bring "fluids" with blood cells to the area to aid in the healing process.  The inflammation should slowly decrease, and her body healing on its own is the best way for the situation to resolve.  She understands this.  I asked Dr. Georgette [name] or one of his associates to see her next week to ensure it continues to heal and to f/u on her pain.  She declines needing any more tramadol for the pain, and is managing constipation from medications with stool softeners.  We reviewed signs and symptoms of infection, and she knows to call if she develops any.    Cortney also has questions about whether or not the prevascular nodes were visible on the 02/2020 MRI.  I am going to call our radiologists and ask them to take a second look at the imaging to determine that.  I will give her an answer by tomorrow afternoon.    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  We discussed that when she goes to PT for her left arm, she can also get treatment for the peripheral neuropathy.  It is however, rather unusual to have such an asymmetric presentation of neuropathy, and it worsen months after treatment has completed.  I placed a neurology referral  to evaluate for any other underlying issues.    Nashla will return in 3 weeks for labs, f/u with Dr. Jana Hakim, and her next Trastuzumab.  She knows to call for any questions that may arise between now and her next appointment.  We are happy to see her sooner if needed.  Total encounter time 45 minutes.Jacqueline Jews C. Magrinat, [name] 04/18/20 11:58 AM Medical  Oncology and Hematology [name] [place], [place] [address] Tel. [phone]    Fax. [phone]     *Total Encounter Time as defined by the Centers for Medicare and Medicaid Services includes, in addition to the face-to-face time of a patient visit (documented in the note above) non-face-to-face time: obtaining and reviewing outside history, ordering and reviewing medications, tests or procedures, care coordination (communications with other health care professionals or caregivers) and documentation in the medical record.

## 2020-04-19 ENCOUNTER — Encounter: Payer: Self-pay | Admitting: Neurology

## 2020-04-19 ENCOUNTER — Telehealth: Payer: Self-pay | Admitting: Adult Health

## 2020-04-19 NOTE — Telephone Encounter (Signed)
Called and reviewed with Zurri that her breast MRI cannot see the central thoracic lymph nodes visualized on PET scan.  I also let her know that Dr. 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  We reviewed the purpose for the radiation oncology referral.    I discussed this with Val and will leave my notes about this with Dr. Jana Hakim to determine whether to do CT chest or PET scan when he returns from his vacation.    Wilber Bihari, NP

## 2020-04-19 NOTE — Telephone Encounter (Signed)
No 7/1 los. No changes made to pt's schedule.

## 2020-04-26 ENCOUNTER — Other Ambulatory Visit: Payer: Self-pay | Admitting: *Deleted

## 2020-04-26 ENCOUNTER — Telehealth: Payer: Self-pay | Admitting: *Deleted

## 2020-04-26 DIAGNOSIS — R29898 Other symptoms and signs involving the musculoskeletal system: Secondary | ICD-10-CM

## 2020-04-26 DIAGNOSIS — C50112 Malignant neoplasm of central portion of left female breast: Secondary | ICD-10-CM

## 2020-04-26 DIAGNOSIS — G620 Drug-induced polyneuropathy: Secondary | ICD-10-CM

## 2020-04-26 NOTE — Telephone Encounter (Signed)
This RN spoke with pt per need for referral to another neurology practice per [place] cannot see her until October.  " that is too long to wait and I am having a lot of weakness in my right arm interfering with my daily activities."  This RN informed pt referral has been sent via Epic and faxed to [facility].  Pt should be receiving a call- they are closed on Friday.

## 2020-04-29 NOTE — Progress Notes (Addendum)
Location of Breast Cancer:  Metastatic LEFT breast cancer (Malignant neoplasm of central portion of left breast, unspecified estrogen receptor status)  Histology per Pathology Report: 04/10/2020 FINAL MICROSCOPIC DIAGNOSIS:  A. BREAST, LEFT, LUMPECTOMY:  - Benign breast tissue with previous surgical and biopsy site changes  - No residual carcinoma identified  B. LYMPH NODE, LEFT AXILLARY, DISSECTION:  - Metastatic carcinoma involving one of two lymph nodes (1/2) 10/30/2019 Diagnosis Lymph node, needle/core biopsy, left axilla - METASTATIC MAMMARY CARCINOMA INVOLVING A LYMPH NODE - SEE COMMENT Microscopic Comment The malignancy is poorly differentiated; therefore, an extensive immunohistochemical panel was performed (lymphoma workup). By immunohistochemistry, the neoplastic cells are positive for cytokeratin AE1/3, GATA3, ER (weak) but negative for PR, PAX 8, WT-1, CD34, CD10, BCL6, BCL2, CD20, CD 138, CD30, CD3, CD5, CD79a, CD45, Mum 1, CD21). 02/10/2011   Receptor Status:  from 10/30/2019 ER (80%), PR (0%), Her2-neu (positive 3+), Ki-67 (70%) from 02/09/2020 ER(82%), PR (92%), Her2-neu (negative), Ki-67(17%)  Did patient present with symptoms (if so, please note symptoms) or was this found on screening mammography?:  Patient had bilateral diagnostic mammography at [facility] on 04/27/2019 with a complaint of left breast cramping and soreness.  This has been present approximately a year.  The study found a new 3.5 cm area of focal asymmetry with amorphous calcification in the left breast upper outer quadrant.  Left breast ultrasonography on the same day found a 2.7 cm region with indistinct margins which was slightly hypoechoic.  This was palpable as a mass in the upper outer aspect of the breast.  Past/Anticipated interventions by surgeon, if any: 04/10/2020 Dr. Matthew Tsuei Left radioactive seed localized lumpectomy/ left targeted axillary lymph node dissection 02/11/2011 Dr. Faera  Byerly Left sentinel lymph node biopsy, lymphatic mapping, and leftneedle-localized segmental mastectomy.  Past/Anticipated interventions by medical oncology, if any: Under care of Dr. Gustav Magrinat  (1)  status post left lumpectomy and sentinel lymph node dissection April of 2012 for a T1b N1(mic) stage IB invasive ductal carcinoma, grade 1, estrogen receptor 82% and progesterone receptor 92% positive, with no HER-2 amplification, and an MIB-1-1 of 17%,  (2)  The patient's Oncotype DX score of 21 predicted a 13% risk of distant recurrence after 5 years of tamoxifen. (3)  status post radiation completed August of 2012,  (4)  on tamoxifen from September of 2012 to April 2014 (5) the plan had been to initiate anastrozole in April 2014, but the patient had a menstrual cycle in May 2014, and resumed tamoxifen.             (a) discontinued tamoxifen on her own initiative June 2015 because of "aches and pains".             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, grade 3, estrogen and progesterone receptor negative (7) definitive surgery delayed (see discussion in 11/03/2019 note) (8) neoadjuvant chemotherapy consisting of trastuzumab (Ogivri), Pertuzumab, carboplatin, docetaxel every 21 days x 6, started 11/21/2019             (a) docetaxel changed to gemcitabine after the first dose because of neuropathy             (b) pertuzumab held with cycle 2 because of persistent diarrhea             (c) anti-HER2 therapy held after cycle 4 because of a drop in EF             (  d) carbo/gemzar stopped after cycle 5, last dose 02/13/2020 (9) anti-HER-2 treatment to be resumed after surgery and continued indefinitely                     (a) trastuzumab resumed 03/28/2020  Lymphedema issues, if any:  Now abnormal swelling to breast or left arm since lumpectomy. Patient  states she developed a seroma after most recent surgery that had to be drained, and she feels like another one has developed (which may need to be drained as well)  Pain issues, if any:  Tenderness/senstivity to left breast since surgery. She states snug fitting/supportive bras help the most with the discomfort.  SAFETY ISSUES:  Prior radiation? Yes left breast 04/08/2011 to 05/25/2011; 45 Gy at 1.8 Gy per fx for 25 fx and 16 Gy at 2 Gy per fraction for 8 fx under care of Dr. Thea Silversmith  Pacemaker/ICD? No  Possible current pregnancy? No-total hysterectomy with bilateral salpingoophorectomy on 06/16/2016  Is the patient on methotrexate? No  Current Complaints / other details:  Patient states she doesn't understand why she was referred for radiation consult given her past radiation history. "I was told I would never be able to receive radiation to the left breast again." She also reports some central chest discomfort that she is not sure if it is reflux in nature or related to her cancer. She states it is very unsettling, and prevents her from doing anything physically active for the day when it flares up.

## 2020-04-30 ENCOUNTER — Ambulatory Visit (INDEPENDENT_AMBULATORY_CARE_PROVIDER_SITE_OTHER): Payer: 59 | Admitting: Internal Medicine

## 2020-04-30 ENCOUNTER — Other Ambulatory Visit: Payer: Self-pay

## 2020-04-30 ENCOUNTER — Ambulatory Visit
Admission: RE | Admit: 2020-04-30 | Discharge: 2020-04-30 | Disposition: A | Discharge status: Home / Self Care | Payer: 59 | Source: Ambulatory Visit | Attending: Radiation Oncology | Admitting: Radiation Oncology

## 2020-04-30 ENCOUNTER — Encounter: Payer: Self-pay | Admitting: Radiation Oncology

## 2020-04-30 ENCOUNTER — Encounter: Payer: Self-pay | Admitting: Internal Medicine

## 2020-04-30 VITALS — BP 139/59 | HR 72 | Temp 98.7°F | Ht <= 58 in | Wt 220.5 lb

## 2020-04-30 VITALS — BP 140/80 | HR 85 | Temp 98.2°F | Ht <= 58 in | Wt 219.0 lb

## 2020-04-30 DIAGNOSIS — I10 Essential (primary) hypertension: Secondary | ICD-10-CM | POA: Diagnosis not present

## 2020-04-30 DIAGNOSIS — Z0001 Encounter for general adult medical examination with abnormal findings: Secondary | ICD-10-CM

## 2020-04-30 DIAGNOSIS — F329 Major depressive disorder, single episode, unspecified: Secondary | ICD-10-CM

## 2020-04-30 DIAGNOSIS — C50112 Malignant neoplasm of central portion of left female breast: Secondary | ICD-10-CM

## 2020-04-30 DIAGNOSIS — Z171 Estrogen receptor negative status [ER-]: Secondary | ICD-10-CM

## 2020-04-30 DIAGNOSIS — F419 Anxiety disorder, unspecified: Secondary | ICD-10-CM

## 2020-04-30 DIAGNOSIS — K449 Diaphragmatic hernia without obstruction or gangrene: Secondary | ICD-10-CM | POA: Diagnosis not present

## 2020-04-30 DIAGNOSIS — C50812 Malignant neoplasm of overlapping sites of left female breast: Secondary | ICD-10-CM | POA: Diagnosis present

## 2020-04-30 DIAGNOSIS — Z170 Estrogen receptor positive status [ER+]: Secondary | ICD-10-CM | POA: Diagnosis not present

## 2020-04-30 DIAGNOSIS — Z79899 Other long term (current) drug therapy: Secondary | ICD-10-CM | POA: Insufficient documentation

## 2020-04-30 DIAGNOSIS — Z0000 Encounter for general adult medical examination without abnormal findings: Secondary | ICD-10-CM | POA: Diagnosis not present

## 2020-04-30 DIAGNOSIS — G4730 Sleep apnea, unspecified: Secondary | ICD-10-CM | POA: Diagnosis not present

## 2020-04-30 DIAGNOSIS — E119 Type 2 diabetes mellitus without complications: Secondary | ICD-10-CM | POA: Diagnosis not present

## 2020-04-30 DIAGNOSIS — Z801 Family history of malignant neoplasm of trachea, bronchus and lung: Secondary | ICD-10-CM | POA: Insufficient documentation

## 2020-04-30 DIAGNOSIS — Z86010 Personal history of colonic polyps: Secondary | ICD-10-CM | POA: Insufficient documentation

## 2020-04-30 DIAGNOSIS — Z809 Family history of malignant neoplasm, unspecified: Secondary | ICD-10-CM | POA: Insufficient documentation

## 2020-04-30 DIAGNOSIS — E785 Hyperlipidemia, unspecified: Secondary | ICD-10-CM | POA: Diagnosis not present

## 2020-04-30 DIAGNOSIS — R1310 Dysphagia, unspecified: Secondary | ICD-10-CM | POA: Insufficient documentation

## 2020-04-30 DIAGNOSIS — Z803 Family history of malignant neoplasm of breast: Secondary | ICD-10-CM | POA: Diagnosis not present

## 2020-04-30 DIAGNOSIS — F32A Depression, unspecified: Secondary | ICD-10-CM

## 2020-04-30 LAB — POCT GLYCOSYLATED HEMOGLOBIN (HGB A1C): Hemoglobin A1C: 5.7 % — AB (ref 4.0–5.6)

## 2020-04-30 NOTE — Assessment & Plan Note (Signed)
stable overall by history and exam, recent data reviewed with pt, and pt to continue medical treatment as before,  to f/u any worsening symptoms or concerns  

## 2020-04-30 NOTE — Assessment & Plan Note (Addendum)
Etiology unclear , GI referral, likely needs egd  I spent 31 minutes in addition to time for CPX wellness examination in preparing to see the patient by review of recent labs, imaging and procedures, obtaining and reviewing separately obtained history, communicating with the patient and family or caregiver, ordering medications, tests or procedures, and documenting clinical information in the EHR including the differential Dx, treatment, and any further evaluation and other management of dysphgaia, DM, htn, hld, anxiety depression
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
## 2020-04-30 NOTE — Assessment & Plan Note (Addendum)
stable overall by history and exam, recent data reviewed with pt, and pt to continue medical treatment as before,  to f/u any worsening symptoms or concerns  Lab Results  Component Value Date   HGBA1C 5.7 (A) 04/30/2020

## 2020-04-30 NOTE — Patient Instructions (Addendum)
Please remember to call about your yearly eye doctor appt  Remember to check with your oncologist about the shingles shot if appropriate  Your A1c was Ok today  You will be contacted regarding the referral for: Gastroenterology  Please continue all other medications as before, and refills have been done if requested.  Please have the pharmacy call with any other refills you may need.  Please continue your efforts at being more active, low cholesterol diet, and weight control.  You are otherwise up to date with prevention measures today.  Please keep your appointments with your specialists as you may have planned - Your endocrinologist in Dec 2021  Please make an Appointment to return for your 1 year visit, or sooner if needed

## 2020-04-30 NOTE — Assessment & Plan Note (Signed)

## 2020-04-30 NOTE — Progress Notes (Signed)
Radiation Oncology         [phone] ________________________________  Initial outpatient Consultation  Name: [name] MRN: [id number]  Date: 04/30/2020  DOB: [birth year]  OZ:DGUY, Hunt Oris, MD  Magrinat, [name], MD   REFERRING PHYSICIAN: Magrinat, [name], MD  DIAGNOSIS:    ICD-10-CM   1. 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 in female, estrogen receptor negative ([place])  C50.812    Z17.1   2. Malignant neoplasm of central portion of left female breast, unspecified estrogen receptor status ([place])  C50.112     CHIEF COMPLAINT: Here to discuss management of recurrent breast cancer  HISTORY OF PRESENT ILLNESS::[name] is a 64 y.o. female who has a previous history of left breast cancer diagnosed in 2012.  At that time she had micrometastatic disease in the axilla. pT1b, pN34mc with her pathologic stage.  Her cancer was moderately ER positive and strongly PR positive.  PR negative.  Grade 1.  Oncotype score was 21.  She received postoperative radiation under the care of Dr. WPablo Ledger  I reviewed her plan - see snapshot below.  She was treated with tangential fields to the left breast to a dose of 45 Gray in 25 fractions followed by a 16 Gy boost in 8 fractions. She took tamoxifen for approximately 3 years after radiotherapy and stopped this due to aches and pains.  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  For numerous reasons, the patient had hesitation to proceed with surgery.  At that time, mastectomy was recommended for cure.  During those months of delay, her cancer progressed -a PET scan upon follow-up in February 2021 showed a hypermetabolic mass in the left breast,   enlarged left axillary lymph node metastasis, hypermetabolic left subpectoralis lymph nodes, hypermetabolic central thoracic metastatic adenopathy with large prevascular lymph node and  left internal mammary lymph node and paratracheal lymph nodes.  I have personally reviewed her imaging.  MRI brain was negative for metastatic disease.  Her cancer upon repeat biopsy was deemed invasive mammary carcinoma, grade 3, 80% weakly estrogen positive, progesterone receptor negative, HER-2 positive.  She proceeded with neoadjuvant chemotherapy and trastuzumab.  The patient was discussed at tumor board after neoadjuvant treatment, and based on a good clinical response in the breast, as well as the known adenopathy in her mediastinum, it was felt that mastectomy would not improve her prognosis, and recommendations were made for breast conserving surgery.  She then proceeded to undergo lumpectomy and removal of 2 lymph nodes on June 23; one of the 2 lymph nodes was positive, noting 2 cm of residual cancer.  The other lymph node was negative.  The lumpectomy tissue showed no residual disease.  Pathologic stage reponew breathing issuesrted as ypT0, ypN1a  She reports heart issues related to trastuzumab.  She denies any new respiratory issues .    PREVIOUS RADIATION THERAPY: Yes as above  PAST MEDICAL HISTORY:  has a past medical history of Abscess of buttock, Allergy, Anemia, Arthritis, Bacterial infection, Boil of buttock, Breast cancer ([facility], COLONIC POLYPS, HX OF (05/11/2008), Diabetes mellitus without complication ([facility], Dysrhythmia, Eczema, Endometrial cancer ([facility] (06/11/2016), Family history of breast cancer, Genital herpes (10/01/2017), GENITAL HERPES, HX OF (08/08/2009), H/O gonorrhea, H/O hiatal hernia, H/O irritable bowel syndrome, Headache, Hematoma, Hernia, HTN (hypertension) (10/01/2017), HYPERLIPIDEMIA (05/11/2008), Hypertension, 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    PAST SURGICAL HISTORY: Past Surgical History:  Procedure Laterality Date  . ABDOMINAL HYSTERECTOMY    . AXILLARY LYMPH NODE DISSECTION    . BREAST CYST EXCISION  1973  . BREAST LUMPECTOMY    . BREAST LUMPECTOMY WITH NEEDLE LOCALIZATION Right 12/20/2013   Procedure: EXCISION RIGHT BREAST MASS WITH NEEDLE LOCALIZATION;  Surgeon: Stark Klein, MD;  Location: [facility];  Service: General;  Laterality: Right;  . BREAST LUMPECTOMY WITH RADIOACTIVE SEED AND AXILLARY LYMPH NODE DISSECTION Left 04/10/2020   Procedure: LEFT BREAST LUMPECTOMY WITH RADIOACTIVE SEED AND TARGETED AXILLARY LYMPH NODE DISSECTION;  Surgeon: Donnie Mesa, MD;  Location: [facility];  Service: General;  Laterality: Left;  LMA, PEC BLOCK  . CESAREAN SECTION     x 1  . COLONOSCOPY    . DILATATION & CURRETTAGE/HYSTEROSCOPY WITH RESECTOCOPE N/A 06/05/2016   Procedure: DILATATION & CURETTAGE/HYSTEROSCOPY;  Surgeon: Eldred Manges, MD;  Location: [facility];  Service: Gynecology;  Laterality: N/A;  . DILATION AND CURETTAGE OF UTERUS    . left achilles tendon repair    . PORTACATH PLACEMENT Right 11/16/2019   Procedure: INSERTION PORT-A-CATH WITH ULTRASOUND;  Surgeon: Donnie Mesa, MD;  Location: [facility];  Service: General;  Laterality: Right;  . right achilles tendon     and left  . right ovarian cyst     hx  . ROBOTIC ASSISTED TOTAL HYSTERECTOMY WITH BILATERAL SALPINGO OOPHERECTOMY Bilateral 06/16/2016   Procedure: XI ROBOTIC ASSISTED TOTAL HYSTERECTOMY WITH BILATERAL SALPINGO OOPHORECTOMY AND SENTINAL LYMPH NODE BIOPSY, MINI LAPAROTOMY;  Surgeon: Everitt Amber, MD;  Location: [facility];  Service: Gynecology;  Laterality: Bilateral;  . s/p ear surgury    . s/p extra uterine fibroid  2006  . s/p left knee replacement  2007  . TOTAL KNEE REVISION Left 07/22/2016   Procedure: TOTAL KNEE REVISION ARTHROPLASTY;  Surgeon: Gaynelle Arabian, MD;  Location: [facility];   Service: Orthopedics;  Laterality: Left;  . UTERINE FIBROID SURGERY  2006   x 1    FAMILY HISTORY: family history includes Alcohol abuse in her father; Breast cancer in her maternal aunt and paternal aunt; Cancer in her maternal grandmother; Diabetes in her mother; Heart disease in her father; Hypertension in her mother; Lung cancer in her maternal uncle; Stroke in her mother.  SOCIAL HISTORY:  reports that she has never smoked. She has never used smokeless tobacco. She reports current alcohol use. She reports that she does not use drugs.  ALLERGIES: Morphine and related, Codeine, Cymbalta [duloxetine hcl], Darvon, Hydrocodone, Hydrocodone-acetaminophen, Oxycodone, Propoxyphene hcl, and Rosuvastatin  MEDICATIONS:  Current Outpatient Medications  Medication Sig Dispense Refill  . carvedilol (COREG) 6.25 MG tablet Take 1 tablet (6.25 mg total) by mouth 2 (two) times daily. 180 tablet 3  . Continuous Blood Gluc Sensor (FREESTYLE LIBRE 14 DAY SENSOR) MISC INJECT 1 SENSOR TO THE SKIN EVERY 14 DAYS FOR CONTINUOUS GLUCOSE MONITORING.    . diphenoxylate-atropine (LOMOTIL) 2.5-0.025 MG tablet 1 to 2 tablets PO QID prn diarrhea 30 tablet 2  . fluticasone (FLONASE) 50 MCG/ACT nasal spray USE 2 SPRAYS IN EACH NOSTRIL ONCE A DAY    . glucose blood (ONE TOUCH ULTRA TEST) test strip 1 each by Other route 2 (two) times daily. Use to check blood sugars twice a day Dx E11.9 100 each 0  . Lancets (ONETOUCH ULTRASOFT)  lancets 1 each by Other route 2 (two) times daily. Use to check blood sugars twice a day Dx E11.9 100 each 0  . omeprazole (PRILOSEC) 40 MG capsule TAKE 1 CAPSULE (40 MG TOTAL) BY MOUTH AT BEDTIME. 30 capsule 2  . ondansetron (ZOFRAN ODT) 4 MG disintegrating tablet Take 1 tablet (4 mg total) by mouth every 8 (eight) hours as needed for nausea or vomiting. 20 tablet 0  . oxyCODONE (OXY IR/ROXICODONE) 5 MG immediate release tablet Take 1 tablet (5 mg total) by mouth every 6 (six) hours as needed for  severe pain. 15 tablet 0  . pravastatin (PRAVACHOL) 40 MG tablet Take 40 mg by mouth daily.    . silver sulfADIAZINE (SILVADENE) 1 % cream Apply 1 application topically daily. 50 g 0  . telmisartan-hydrochlorothiazide (MICARDIS HCT) 80-12.5 MG tablet Take 1 tablet by mouth daily.    . traMADol (ULTRAM) 50 MG tablet Take 1 tablet (50 mg total) by mouth every 6 (six) hours as needed for moderate pain or severe pain. 30 tablet 0  . valACYclovir (VALTREX) 500 MG tablet Take 1 tablet (500 mg total) by mouth 2 (two) times daily. 60 tablet 5   No current facility-administered medications for this encounter.   Facility-Administered Medications Ordered in Other Encounters  Medication Dose Route Frequency Provider Last Rate Last Admin  . cloNIDine (CATAPRES) tablet 0.1 mg  0.1 mg Oral Daily Harle Stanford., PA-C   0.1 mg at 11/21/19 1742    REVIEW OF SYSTEMS: As above    PHYSICAL EXAM:  height is 4' 9"  (1.448 m) and weight is 220 lb 8 oz (100 kg). Her oral temperature is 98.7 F (37.1 C). Her blood pressure is 139/59 (abnormal) and her pulse is 72. Her oxygen saturation is 100%.   General: Alert and oriented, in no acute distress Neck: Neck is supple, no palpable cervical or supraclavicular lymphadenopathy. Psychiatric: Judgment and insight are intact. Affect is appropriate. Breasts: She has a moderate seroma in the lateral left breast at the lumpectomy site; mild lymphedema in the left breast.  Postoperative thickening under left axillary scar; no obvious adenopathy palpated in the left axilla   ECOG = 1  0 - Asymptomatic (Fully active, able to carry on all predisease activities without restriction)  1 - Symptomatic but completely ambulatory (Restricted in physically strenuous activity but ambulatory and able to carry out work of a light or sedentary nature. For example, light housework, office work)  2 - Symptomatic, <50% in bed during the day (Ambulatory and capable of all self care but unable  to carry out any work activities. Up and about more than 50% of waking hours)  3 - Symptomatic, >50% in bed, but not bedbound (Capable of only limited self-care, confined to bed or chair 50% or more of waking hours)  4 - Bedbound (Completely disabled. Cannot carry on any self-care. Totally confined to bed or chair)  5 - Death   Eustace Pen MM, Creech RH, Tormey DC, et al. [phone]). "Toxicity and response criteria of the [facility]". [place] Oncol. 5 (6): 649-55   LABORATORY DATA:  Lab Results  Component Value Date   WBC 7.0 04/18/2020   HGB 8.6 (L) 04/18/2020   HCT 26.9 (L) 04/18/2020   MCV 98.2 04/18/2020   PLT 248 04/18/2020   CMP     Component Value Date/Time   NA 134 (L) 04/18/2020 0824   NA 143 03/30/2017 1044   K 3.7 04/18/2020 2353  K 3.8 03/30/2017 1044   CL 101 04/18/2020 0824   CL 106 01/18/2013 0909   CO2 22 04/18/2020 0824   CO2 27 03/30/2017 1044   GLUCOSE 141 (H) 04/18/2020 0824   GLUCOSE 91 03/30/2017 1044   GLUCOSE 99 01/18/2013 0909   BUN 14 04/18/2020 0824   BUN 9.7 03/30/2017 1044   CREATININE 1.09 (H) 04/18/2020 0824   CREATININE 0.8 03/30/2017 1044   CALCIUM 9.4 04/18/2020 0824   CALCIUM 9.7 03/30/2017 1044   PROT 9.5 (H) 04/18/2020 0824   PROT 8.0 03/30/2017 1044   ALBUMIN 2.9 (L) 04/18/2020 0824   ALBUMIN 3.4 (L) 03/30/2017 1044   AST 12 (L) 04/18/2020 0824   AST 17 03/30/2017 1044   ALT 13 04/18/2020 0824   ALT 15 03/30/2017 1044   ALKPHOS 52 04/18/2020 0824   ALKPHOS 60 03/30/2017 1044   BILITOT 0.5 04/18/2020 0824   BILITOT 0.34 03/30/2017 1044   GFRNONAA 55 (L) 04/18/2020 0824   GFRAA >60 04/18/2020 0824         RADIOGRAPHY: As above   IMPRESSION/PLAN: This is a very nice woman with an unfortunate recurrence of left breast cancer.  This progressed rather quickly following her diagnosis of recurrence in July 2020.  Restaging scans in February showed that the disease had become invasive with locally advanced  adenopathy as well as mediastinal adenopathy.  She has had received previous radiation to the whole left breast.  This was delivered by my colleague in 2012.  More recently, for salvage treatment, she underwent neoadjuvant chemotherapy and trastuzumab followed by breast conserving surgery.  At this point in time, I think it would be prudent for her to undergo restaging PET scan, to discern if she has any residual gross disease (whether systemically or loco regionally).  She may need a little more time to heal from surgery and let the inflammation go down before she undergoes a reliable PET scan.  I spoke with Dr. Jana Hakim about this and he agrees.  He will arrange a restaging PET scan in mid September and we will both see the patient back for follow-up after that scan.  I left a message for the patient following my conversation with Dr. Jana Hakim to let her know about her consensus.  She also has the contact information for my clinic if she has any further questions in the interim.  She understands that prior radiotherapy to the breast does not necessarily prohibit her from receiving further radiotherapy if we feel that it can improve her prognosis in the future.  She will remain at risk for distant progression, but there may potentially be benefit for consolidative radiotherapy to her mediastinal and regional lymph nodes depending on the PET results.  I look forward to seeing her back in a couple months and participating in her care as needed.  We discussed measures to reduce the risk of infection during the COVID-19 pandemic.  She has been vaccinated   On date of service, in total, I spent 60 minutes on this encounter. Patient was seen in person.  __________________________________________   Eppie Gibson, MD

## 2020-05-01 ENCOUNTER — Encounter: Payer: Self-pay | Admitting: Radiation Oncology

## 2020-05-02 ENCOUNTER — Encounter: Payer: Self-pay | Admitting: Gastroenterology

## 2020-05-02 ENCOUNTER — Other Ambulatory Visit: Payer: Self-pay | Admitting: Oncology

## 2020-05-02 DIAGNOSIS — Z171 Estrogen receptor negative status [ER-]: Secondary | ICD-10-CM

## 2020-05-02 DIAGNOSIS — C50812 Malignant neoplasm of overlapping sites of left female breast: Secondary | ICD-10-CM

## 2020-05-02 NOTE — Progress Notes (Signed)
Dr. Isidore Moos saw the patient and rightly questions what remains of active disease, particularly with regards to some of the mediastinal adenopathy and axillary adenopathy previously noted.  We are going to obtain a PET scan to assess that and treat accordingly.

## 2020-05-03 ENCOUNTER — Telehealth: Payer: Self-pay

## 2020-05-03 NOTE — Telephone Encounter (Signed)
Pt pet scan rescheduled for 07/01/20 at 0900 per surgeon recommendation provider agrees per progress notes dated 05/01/11 Dr. Isidore Moos

## 2020-05-03 NOTE — Progress Notes (Signed)
Pharmacist Chemotherapy Monitoring - Follow Up Assessment    I verify that I have reviewed each item in the below checklist:  . Regimen for the patient is scheduled for the appropriate day and plan matches scheduled date. [name] Appropriate non-routine labs are ordered dependent on drug ordered. . If applicable, additional medications reviewed and ordered per protocol based on lifetime cumulative doses and/or treatment regimen.   Plan for follow-up and/or issues identified: Yes . I-vent associated with next due treatment: Yes . MD and/or nursing notified: No   Kennith Center, Pharm.D., CPP 05/03/2020@1 :15 PM

## 2020-05-06 ENCOUNTER — Ambulatory Visit (HOSPITAL_COMMUNITY)
Admission: RE | Admit: 2020-05-06 | Discharge: 2020-05-06 | Disposition: A | Discharge status: Home / Self Care | Payer: 59 | Source: Ambulatory Visit | Attending: Cardiology | Admitting: Cardiology

## 2020-05-06 ENCOUNTER — Other Ambulatory Visit: Payer: Self-pay

## 2020-05-06 ENCOUNTER — Other Ambulatory Visit: Payer: Self-pay | Admitting: Internal Medicine

## 2020-05-06 DIAGNOSIS — E119 Type 2 diabetes mellitus without complications: Secondary | ICD-10-CM | POA: Insufficient documentation

## 2020-05-06 DIAGNOSIS — I10 Essential (primary) hypertension: Secondary | ICD-10-CM | POA: Insufficient documentation

## 2020-05-06 DIAGNOSIS — E785 Hyperlipidemia, unspecified: Secondary | ICD-10-CM | POA: Insufficient documentation

## 2020-05-06 DIAGNOSIS — C50112 Malignant neoplasm of central portion of left female breast: Secondary | ICD-10-CM | POA: Insufficient documentation

## 2020-05-06 LAB — ECHOCARDIOGRAM COMPLETE
Area-P 1/2: 2.74 cm2
S' Lateral: 2.8 cm

## 2020-05-06 NOTE — Progress Notes (Signed)
  Echocardiogram 2D Echocardiogram has been performed.  Jennette Dubin 05/06/2020, 9:58 AM

## 2020-05-08 NOTE — Progress Notes (Signed)
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 minutes.Sarajane Jews C. Cleotilde Spadaccini, MD 05/09/20 9:08 AM Medical Oncology and Hematology [facility] [place], [place] [address] Tel. [phone]    Fax. [phone]   I, Wilburn Mylar, am acting as scribe for Dr. [name]. [place].  I, Lurline Del MD, have reviewed the above documentation for accuracy and completeness, and I agree with the above.    *Total Encounter Time as defined by the Centers for Medicare and Medicaid Services includes, in addition to the face-to-face time of a patient visit (documented in the note above) non-face-to-face time: obtaining and reviewing outside history, ordering and reviewing medications, tests or procedures, care coordination (communications with other health care professionals or caregivers) and documentation in the medical record.

## 2020-05-09 ENCOUNTER — Other Ambulatory Visit: Payer: Self-pay

## 2020-05-09 ENCOUNTER — Inpatient Hospital Stay: Payer: 59

## 2020-05-09 ENCOUNTER — Inpatient Hospital Stay (HOSPITAL_BASED_OUTPATIENT_CLINIC_OR_DEPARTMENT_OTHER): Payer: 59 | Admitting: Oncology

## 2020-05-09 ENCOUNTER — Telehealth: Payer: Self-pay | Admitting: Oncology

## 2020-05-09 VITALS — BP 137/99 | HR 81 | Temp 98.5°F | Resp 18 | Ht <= 58 in | Wt 217.5 lb

## 2020-05-09 DIAGNOSIS — C50812 Malignant neoplasm of overlapping sites of left female breast: Secondary | ICD-10-CM

## 2020-05-09 DIAGNOSIS — Z5112 Encounter for antineoplastic immunotherapy: Secondary | ICD-10-CM | POA: Diagnosis not present

## 2020-05-09 DIAGNOSIS — C541 Malignant neoplasm of endometrium: Secondary | ICD-10-CM

## 2020-05-09 DIAGNOSIS — C50112 Malignant neoplasm of central portion of left female breast: Secondary | ICD-10-CM | POA: Diagnosis not present

## 2020-05-09 DIAGNOSIS — Z171 Estrogen receptor negative status [ER-]: Secondary | ICD-10-CM

## 2020-05-09 DIAGNOSIS — Z95828 Presence of other vascular implants and grafts: Secondary | ICD-10-CM

## 2020-05-09 LAB — CBC WITH DIFFERENTIAL (CANCER CENTER ONLY)
Abs Immature Granulocytes: 0.03 K/uL (ref 0.00–0.07)
Basophils Absolute: 0 K/uL (ref 0.0–0.1)
Basophils Relative: 0 %
Eosinophils Absolute: 0.1 K/uL (ref 0.0–0.5)
Eosinophils Relative: 1 %
HCT: 24.8 % — ABNORMAL LOW (ref 36.0–46.0)
Hemoglobin: 7.8 g/dL — ABNORMAL LOW (ref 12.0–15.0)
Immature Granulocytes: 0 %
Lymphocytes Relative: 20 %
Lymphs Abs: 1.6 K/uL (ref 0.7–4.0)
MCH: 29.3 pg (ref 26.0–34.0)
MCHC: 31.5 g/dL (ref 30.0–36.0)
MCV: 93.2 fL (ref 80.0–100.0)
Monocytes Absolute: 1 K/uL (ref 0.1–1.0)
Monocytes Relative: 12 %
Neutro Abs: 5.2 K/uL (ref 1.7–7.7)
Neutrophils Relative %: 67 %
Platelet Count: 251 K/uL (ref 150–400)
RBC: 2.66 MIL/uL — ABNORMAL LOW (ref 3.87–5.11)
RDW: 13.1 % (ref 11.5–15.5)
WBC Count: 7.9 K/uL (ref 4.0–10.5)
nRBC: 0 % (ref 0.0–0.2)

## 2020-05-09 LAB — CMP (CANCER CENTER ONLY)
ALT: 14 U/L (ref 0–44)
AST: 15 U/L (ref 15–41)
Albumin: 2.6 g/dL — ABNORMAL LOW (ref 3.5–5.0)
Alkaline Phosphatase: 45 U/L (ref 38–126)
Anion gap: 9 (ref 5–15)
BUN: 14 mg/dL (ref 8–23)
CO2: 24 mmol/L (ref 22–32)
Calcium: 10.1 mg/dL (ref 8.9–10.3)
Chloride: 101 mmol/L (ref 98–111)
Creatinine: 0.95 mg/dL (ref 0.44–1.00)
GFR, Est AFR Am: >60 mL/min (ref >60)
GFR, Estimated: >60 mL/min (ref >60)
Glucose, Bld: 120 mg/dL — ABNORMAL HIGH (ref 70–99)
Potassium: 4.2 mmol/L (ref 3.5–5.1)
Sodium: 134 mmol/L — ABNORMAL LOW (ref 135–145)
Total Bilirubin: 0.3 mg/dL (ref 0.3–1.2)
Total Protein: 10.7 g/dL — ABNORMAL HIGH (ref 6.5–8.1)

## 2020-05-09 MED ORDER — SODIUM CHLORIDE 0.9% FLUSH
10.0000 mL | INTRAVENOUS | Status: DC | PRN
  Administered 2020-05-09: 10 mL
  Filled 2020-05-09: qty 10

## 2020-05-09 MED ORDER — SODIUM CHLORIDE 0.9 % IV SOLN
Freq: Once | INTRAVENOUS | Status: AC
  Filled 2020-05-09: qty 250

## 2020-05-09 MED ORDER — TRASTUZUMAB-ANNS CHEMO 150 MG IV SOLR
600.0000 mg | Freq: Once | INTRAVENOUS | Status: AC
  Administered 2020-05-09: 600 mg via INTRAVENOUS
  Filled 2020-05-09: qty 28.57

## 2020-05-09 MED ORDER — DIPHENHYDRAMINE HCL 25 MG PO CAPS
ORAL_CAPSULE | ORAL | Status: AC
  Filled 2020-05-09: qty 1

## 2020-05-09 MED ORDER — SODIUM CHLORIDE 0.9% FLUSH
10.0000 mL | Freq: Once | INTRAVENOUS | Status: AC
  Administered 2020-05-09: 10 mL
  Filled 2020-05-09: qty 10

## 2020-05-09 MED ORDER — ACETAMINOPHEN 325 MG PO TABS
650.0000 mg | ORAL_TABLET | Freq: Once | ORAL | Status: AC
  Administered 2020-05-09: 650 mg via ORAL

## 2020-05-09 MED ORDER — HEPARIN SOD (PORK) LOCK FLUSH 100 UNIT/ML IV SOLN
500.0000 [IU] | Freq: Once | INTRAVENOUS | Status: AC | PRN
  Administered 2020-05-09: 500 [IU]
  Filled 2020-05-09: qty 5

## 2020-05-09 MED ORDER — DIPHENHYDRAMINE HCL 25 MG PO CAPS
25.0000 mg | ORAL_CAPSULE | Freq: Once | ORAL | Status: AC
  Administered 2020-05-09: 25 mg via ORAL

## 2020-05-09 MED ORDER — ACETAMINOPHEN 325 MG PO TABS
ORAL_TABLET | ORAL | Status: AC
  Filled 2020-05-09: qty 2

## 2020-05-09 NOTE — Patient Instructions (Signed)
[name] Discharge Instructions for Patients Receiving Chemotherapy  Today you received the following chemotherapy agents Transtuzumab.  To help prevent nausea and vomiting after your treatment, we encourage you to take your nausea medication    If you develop nausea and vomiting that is not controlled by your nausea medication, call the clinic.   BELOW ARE SYMPTOMS THAT SHOULD BE REPORTED IMMEDIATELY:  *FEVER GREATER THAN 100.5 F  *CHILLS WITH OR WITHOUT FEVER  NAUSEA AND VOMITING THAT IS NOT CONTROLLED WITH YOUR NAUSEA MEDICATION  *UNUSUAL SHORTNESS OF BREATH  *UNUSUAL BRUISING OR BLEEDING  TENDERNESS IN MOUTH AND THROAT WITH OR WITHOUT PRESENCE OF ULCERS  *URINARY PROBLEMS  *BOWEL PROBLEMS  UNUSUAL RASH Items with * indicate a potential emergency and should be followed up as soon as possible.  Feel free to call the clinic should you have any questions or concerns. The clinic phone number is [phone].  Please show the [place] at check-in to the Emergency Department and triage nurse.

## 2020-05-09 NOTE — Telephone Encounter (Signed)
Scheduled appts per 7/22 los. Gave pt a print out of AVS.

## 2020-05-10 ENCOUNTER — Encounter (HOSPITAL_COMMUNITY): Payer: 59 | Admitting: Cardiology

## 2020-05-10 ENCOUNTER — Other Ambulatory Visit (HOSPITAL_COMMUNITY): Payer: 59

## 2020-05-10 ENCOUNTER — Other Ambulatory Visit: Payer: Self-pay | Admitting: Internal Medicine

## 2020-05-10 NOTE — Telephone Encounter (Signed)
Please refill as per office routine med refill policy (all routine meds refilled for 3 mo or monthly per pt preference up to one year from last visit, then month to month grace period for 3 mo, then further med refills will have to be denied)  

## 2020-05-17 ENCOUNTER — Telehealth (HOSPITAL_COMMUNITY): Payer: Self-pay | Admitting: Oncology

## 2020-05-17 NOTE — Telephone Encounter (Signed)
We have an order for echocardiogram that you ordered and I am checking to see if you would still like patient to have this? She has an echocardiogram on 05/06/20 at [facility] and I just wanted to make sure this was needed. Thank you.

## 2020-05-18 ENCOUNTER — Other Ambulatory Visit: Payer: Self-pay | Admitting: Adult Health

## 2020-05-18 NOTE — Telephone Encounter (Signed)
Hi Lisa.  I saw where she underwent echocardiogram and f/u with Dr. Aundra Dubin on 05/06/2020.  She will not need an echocardiogram until 07/2020 and Dr. Aundra Dubin is arranging this.  I have discontinued this order.  Thanks so much.

## 2020-05-20 ENCOUNTER — Telehealth: Payer: Self-pay | Admitting: *Deleted

## 2020-05-20 NOTE — Telephone Encounter (Signed)
Received vm call today from [name] reporting cough when she takes a deep breath.  Returned call & [name] reports non productive cough x 1 & 1/2 weeks.  She states she feels like there is mucous in her throat but can/t get anything up.  Her cough sound dry on the phone. She reports no swelling but feels tired & no energy.  Message to Dr Jana Hakim.

## 2020-05-23 ENCOUNTER — Ambulatory Visit (HOSPITAL_COMMUNITY)
Admission: RE | Admit: 2020-05-23 | Discharge: 2020-05-23 | Disposition: A | Discharge status: Home / Self Care | Payer: 59 | Source: Ambulatory Visit | Attending: Cardiology | Admitting: Cardiology

## 2020-05-23 ENCOUNTER — Other Ambulatory Visit: Payer: Self-pay

## 2020-05-23 ENCOUNTER — Telehealth: Payer: Self-pay | Admitting: Adult Health

## 2020-05-23 ENCOUNTER — Encounter (HOSPITAL_COMMUNITY): Payer: Self-pay | Admitting: Cardiology

## 2020-05-23 VITALS — BP 110/70 | HR 77 | Ht <= 58 in | Wt 211.6 lb

## 2020-05-23 DIAGNOSIS — Z9221 Personal history of antineoplastic chemotherapy: Secondary | ICD-10-CM | POA: Insufficient documentation

## 2020-05-23 DIAGNOSIS — I429 Cardiomyopathy, unspecified: Secondary | ICD-10-CM

## 2020-05-23 DIAGNOSIS — Z8249 Family history of ischemic heart disease and other diseases of the circulatory system: Secondary | ICD-10-CM | POA: Diagnosis not present

## 2020-05-23 DIAGNOSIS — Z79899 Other long term (current) drug therapy: Secondary | ICD-10-CM | POA: Insufficient documentation

## 2020-05-23 DIAGNOSIS — Z923 Personal history of irradiation: Secondary | ICD-10-CM | POA: Diagnosis not present

## 2020-05-23 DIAGNOSIS — Z90722 Acquired absence of ovaries, bilateral: Secondary | ICD-10-CM | POA: Insufficient documentation

## 2020-05-23 DIAGNOSIS — I427 Cardiomyopathy due to drug and external agent: Secondary | ICD-10-CM | POA: Diagnosis not present

## 2020-05-23 DIAGNOSIS — Z833 Family history of diabetes mellitus: Secondary | ICD-10-CM | POA: Insufficient documentation

## 2020-05-23 DIAGNOSIS — R0600 Dyspnea, unspecified: Secondary | ICD-10-CM | POA: Insufficient documentation

## 2020-05-23 DIAGNOSIS — I5032 Chronic diastolic (congestive) heart failure: Secondary | ICD-10-CM

## 2020-05-23 DIAGNOSIS — Z853 Personal history of malignant neoplasm of breast: Secondary | ICD-10-CM | POA: Diagnosis not present

## 2020-05-23 DIAGNOSIS — I10 Essential (primary) hypertension: Secondary | ICD-10-CM | POA: Diagnosis not present

## 2020-05-23 DIAGNOSIS — R079 Chest pain, unspecified: Secondary | ICD-10-CM | POA: Diagnosis not present

## 2020-05-23 DIAGNOSIS — Z90710 Acquired absence of both cervix and uterus: Secondary | ICD-10-CM | POA: Insufficient documentation

## 2020-05-23 DIAGNOSIS — Z8544 Personal history of malignant neoplasm of other female genital organs: Secondary | ICD-10-CM | POA: Diagnosis not present

## 2020-05-23 DIAGNOSIS — T451X5A Adverse effect of antineoplastic and immunosuppressive drugs, initial encounter: Secondary | ICD-10-CM | POA: Diagnosis not present

## 2020-05-23 DIAGNOSIS — Z803 Family history of malignant neoplasm of breast: Secondary | ICD-10-CM | POA: Insufficient documentation

## 2020-05-23 DIAGNOSIS — E119 Type 2 diabetes mellitus without complications: Secondary | ICD-10-CM | POA: Diagnosis not present

## 2020-05-23 DIAGNOSIS — E785 Hyperlipidemia, unspecified: Secondary | ICD-10-CM | POA: Insufficient documentation

## 2020-05-23 LAB — BASIC METABOLIC PANEL
Anion gap: 10 (ref 5–15)
BUN: 40 mg/dL — ABNORMAL HIGH (ref 8–23)
CO2: 23 mmol/L (ref 22–32)
Calcium: 9.6 mg/dL (ref 8.9–10.3)
Chloride: 97 mmol/L — ABNORMAL LOW (ref 98–111)
Creatinine, Ser: 1.36 mg/dL — ABNORMAL HIGH (ref 0.44–1.00)
GFR calc Af Amer: 49 mL/min — ABNORMAL LOW (ref >60)
GFR calc non Af Amer: 42 mL/min — ABNORMAL LOW (ref >60)
Glucose, Bld: 163 mg/dL — ABNORMAL HIGH (ref 70–99)
Potassium: 3.5 mmol/L (ref 3.5–5.1)
Sodium: 130 mmol/L — ABNORMAL LOW (ref 135–145)

## 2020-05-23 LAB — LIPID PANEL
Cholesterol: 137 mg/dL (ref 0–200)
HDL: 41 mg/dL (ref >40)
LDL Cholesterol: 81 mg/dL (ref 0–99)
Total CHOL/HDL Ratio: 3.3 RATIO
Triglycerides: 74 mg/dL (ref <150)
VLDL: 15 mg/dL (ref 0–40)

## 2020-05-23 LAB — BRAIN NATRIURETIC PEPTIDE: B Natriuretic Peptide: 35.6 pg/mL (ref 0.0–100.0)

## 2020-05-23 MED ORDER — FUROSEMIDE 40 MG PO TABS
40.0000 mg | ORAL_TABLET | Freq: Every day | ORAL | 3 refills | Status: DC
Start: 2020-05-23 — End: 2020-06-28

## 2020-05-23 MED ORDER — POTASSIUM CHLORIDE CRYS ER 20 MEQ PO TBCR
20.0000 meq | EXTENDED_RELEASE_TABLET | Freq: Every day | ORAL | 3 refills | Status: DC
Start: 2020-05-23 — End: 2020-09-20

## 2020-05-23 NOTE — Progress Notes (Signed)
ReDS Vest / Clip - 05/23/20 1117      ReDS Vest / Clip   Station Marker B    Ruler Value 34    ReDS Value Range High volume overload    ReDS Actual Value 41

## 2020-05-23 NOTE — Progress Notes (Signed)
Oncology: Dr. Jana Hakim  64 y.o. with history of DM, HTN, hyperlipidemia, endometrial cancer, and breast cancer was referred by Dr. Jana Hakim for cardio-oncology evaluation.  Initial breast cancer diagnosis was in 2012.  She had left breast lumpectomy and radiation.  Recurrence in 7/20, ER+/PR-/HER2+.  She had chemotherapy with trastuzumab/pertuzumab/carboplatin/docetaxol x 6 starting 2/21.  With fall in EF in 4/21, Herceptin was held.  Echo in 4/21 showed EF 45-50%, Coreg was started. Echo in 6/21 showed EF back up to 55-60%, and Herceptin was restarted.  In 6/21, she had lumpectomy.  Echo in 7/21 showed EF stable at 55-60%.   Patient has multiple complaints today.  She feels weak/tired/fatigued all the time. This has been gradually worsening over months.  She has a cough.  No fever, she had the COVID vaccination.  She is short of breath with moderate exertion like walking up inclines/stairs or walking longer distances.  She gets tightness in her central chest with exertion such as washing dishes, dressing.    REDS clip 41%  Labs (5/21): K 4.1, creatinine 0.89 Labs (7/21): K 4.2, creatinine 0.95  PMH: 1. HTN 2. Diabetes 3. Endometrial carcinoma: 8/17 diagnosis, had hysterectomy and BSO.  4. Hyperlipidemia 5. Breast cancer: Initial diagnosis in 2012.  Had left breast lumpectomy and radiation.  Recurrence in 7/20, ER+/PR-/HER2+.  She had chemotherapy with trastuzumab/pertuzumab/carboplatin/docetaxol x 6 starting 2/21.  With fall in EF in 4/21, Herceptin was held.  Lumpectomy 6/21.  - Echo (1/21): EF 60-65%, GLS -20%, normal RV.  - Echo (4/21): EF 45-50%, global hypokinesis, GLS -12.6%, mildly decreased RV function.  - Echo (6/21): EF 55-60%, GLS -14.4%, mild LVH, PASP 38, RV normal.  - Echo (7/21): EF 55-60%, mild LVH, GLS -16.9%, RV  normal  Social History   Socioeconomic History  . Marital status: Divorced    Spouse name: Not on file  . Number of children: 0  . Years of education: Not on  file  . Highest education level: Not on file  Occupational History  . Occupation: Education officer, museum    Employer: [employer]  Tobacco Use  . Smoking status: Never Smoker  . Smokeless tobacco: Never Used  Vaping Use  . Vaping Use: Never used  Substance and Sexual Activity  . Alcohol use: Yes    Comment: occasional  . Drug use: No  . Sexual activity: Not Currently  Other Topics Concern  . Not on file  Social History Narrative   1 son deceased with homicide   Patient has been divorced twice   Social Determinants of Health   Financial Resource Strain:   . Difficulty of Paying Living Expenses:   Food Insecurity:   . Worried About Charity fundraiser in the Last Year:   . Arboriculturist in the Last Year:   Transportation Needs:   . Film/video editor (Medical):   [name] Lack of Transportation (Non-Medical):   Physical Activity:   . Days of Exercise per Week:   . Minutes of Exercise per Session:   Stress:   . Feeling of Stress :   Social Connections:   . Frequency of Communication with Friends and Family:   . Frequency of Social Gatherings with Friends and Family:   . Attends Religious Services:   . Active Member of Clubs or Organizations:   . Attends Archivist Meetings:   [name] Marital Status:   Intimate Partner Violence:   . Fear of Current or Ex-Partner:   . Emotionally Abused:   .  Physically Abused:   . Sexually Abused:    Family History  Problem Relation Age of Onset  . Diabetes Mother   . Hypertension Mother   . Stroke Mother   . Heart disease Father        COPD  . Alcohol abuse Father        ETOH dependence  . Breast cancer Maternal Aunt        dx in her 88s  . Lung cancer Maternal Uncle   . Breast cancer Paternal Aunt   . Cancer Maternal Grandmother        salivary gland cancer  . Colon cancer Neg Hx    ROS: All systems reviewed and negative except as per HPI.   Current Outpatient Medications  Medication Sig Dispense Refill  . carvedilol  (COREG) 6.25 MG tablet Take 1 tablet (6.25 mg total) by mouth 2 (two) times daily. 180 tablet 3  . Continuous Blood Gluc Sensor (FREESTYLE LIBRE 14 DAY SENSOR) MISC INJECT 1 SENSOR TO THE SKIN EVERY 14 DAYS FOR CONTINUOUS GLUCOSE MONITORING.    . fluticasone (FLONASE) 50 MCG/ACT nasal spray USE 2 SPRAYS IN EACH NOSTRIL ONCE A DAY    . glucose blood (ONE TOUCH ULTRA TEST) test strip 1 each by Other route 2 (two) times daily. Use to check blood sugars twice a day Dx E11.9 100 each 0  . Lancets (ONETOUCH ULTRASOFT) lancets 1 each by Other route 2 (two) times daily. Use to check blood sugars twice a day Dx E11.9 100 each 0  . omeprazole (PRILOSEC) 40 MG capsule TAKE 1 CAPSULE (40 MG TOTAL) BY MOUTH AT BEDTIME. 30 capsule 2  . ondansetron (ZOFRAN ODT) 4 MG disintegrating tablet Take 1 tablet (4 mg total) by mouth every 8 (eight) hours as needed for nausea or vomiting. 20 tablet 0  . oxyCODONE (OXY IR/ROXICODONE) 5 MG immediate release tablet Take 1 tablet (5 mg total) by mouth every 6 (six) hours as needed for severe pain. 15 tablet 0  . pravastatin (PRAVACHOL) 40 MG tablet TAKE 1 TABLET BY MOUTH EVERY DAY 30 tablet 11  . silver sulfADIAZINE (SILVADENE) 1 % cream Apply 1 application topically daily. 50 g 0  . telmisartan-hydrochlorothiazide (MICARDIS HCT) 80-12.5 MG tablet TAKE 1 TABLET BY MOUTH EVERY DAY 30 tablet 11  . traMADol (ULTRAM) 50 MG tablet Take 1 tablet (50 mg total) by mouth every 6 (six) hours as needed for moderate pain or severe pain. 30 tablet 0  . valACYclovir (VALTREX) 500 MG tablet Take 1 tablet (500 mg total) by mouth 2 (two) times daily. 60 tablet 5  . furosemide (LASIX) 40 MG tablet Take 1 tablet (40 mg total) by mouth daily. 30 tablet 3  . potassium chloride SA (KLOR-CON) 20 MEQ tablet Take 1 tablet (20 mEq total) by mouth daily. 30 tablet 3   No current facility-administered medications for this encounter.   Facility-Administered Medications Ordered in Other Encounters   Medication Dose Route Frequency Provider Last Rate Last Admin  . cloNIDine (CATAPRES) tablet 0.1 mg  0.1 mg Oral Daily Harle Stanford., PA-C   0.1 mg at 11/21/19 1742   BP 110/70   Pulse 77   Ht 4' 9"  (1.448 m)   Wt 96 kg (211 lb 9.6 oz)   LMP 05/18/2016   SpO2 100%   BMI 45.79 kg/m  General: NAD Neck: JVP 8-9 cm, no thyromegaly or thyroid nodule.  Lungs: Clear to auscultation bilaterally with normal respiratory effort. CV: Nondisplaced PMI.  Heart regular S1/S2, no S3/S4, no murmur.  No peripheral edema.  No carotid bruit.  Normal pedal pulses.  Abdomen: Soft, nontender, no hepatosplenomegaly, no distention.  Skin: Intact without lesions or rashes.  Neurologic: Alert and oriented x 3.  Psych: Normal affect. Extremities: No clubbing or cyanosis.  HEENT: Normal.   Assessment/Plan: 1. Chemotherapy-related cardiomyopathy: Fall in EF and strain in the setting of Herceptin use.  Echo in 4/21 with EF 45-50% with GLS -12.6%.  Herceptin was held, Coreg started, and echo in 6/21 showed EF up to 55-60% with GLS -14.4% => Herceptin restarted.  Echo in 7/21 with EF 55-60%, GLS more negative at -16.9%. Recently, she has been having worsening fatigue/tiredness as well as exertional dyspnea and exertional chest tightness.  She is mildly volume overloaded on exam and REDS clip 41% also suggests volume overload.  - Continue Coreg 6.25 mg bid and telmisartan.  - I will start her on Lasix 40 mg daily with KCl 20 daily.  BMET in 10 days.  - BNP today.   - CXR PA/lateral.  - Repeat echo at followup in 1 month.  2. HTN: BP is well-controlled.  3. Type 2 diabetes: Given diabetes diagnosis, check lipids and consider statin use.  4. Chest pain: Patient reports exertional chest tightness.  - I will arrange for coronary CT angiogram to assess for significant coronary disease.   Loralie Champagne 05/23/2020

## 2020-05-23 NOTE — Patient Instructions (Signed)
Start Furosemide 40 mg Daily  Start Potassium 20 meq Daily  Labs done today, your results will be available in MyChart, we will contact you for abnormal readings.  Your physician recommends that you return for lab work in: 10-14 days  Chest X-ray needed today  Your physician has requested that you have cardiac CT. Cardiac computed tomography (CT) is a painless test that uses an x-ray machine to take clear, detailed pictures of your heart. For further information please visit [email or web address]. Please follow instruction sheet as given. ONCE THIS IS APPROVED BY YOUR INSURANCE YOU WILL BE CONTACTED TO SCHEDULE  Your physician recommends that you schedule a follow-up appointment in: 1 month with an echocardiogram  If you have any questions or concerns before your next appointment please send [place] a message through [place] or call our office at [phone].    TO LEAVE A MESSAGE FOR THE NURSE SELECT OPTION 2, PLEASE LEAVE A MESSAGE INCLUDING: . YOUR NAME . DATE OF BIRTH . CALL BACK NUMBER . REASON FOR CALL**this is important as we prioritize the call backs  [place] AS LONG AS YOU CALL BEFORE 4:00 PM  At the [facility], you and your health needs are our priority. As part of our continuing mission to provide you with exceptional heart care, we have created designated Provider Care Teams. These Care Teams include your primary Cardiologist (physician) and Advanced Practice Providers (APPs- Physician Assistants and Nurse Practitioners) who all work together to provide you with the care you need, when you need it.   You may see any of the following providers on your designated Care Team at your next follow up: [name] Dr Glori Bickers . Dr Loralie Champagne . Darrick Grinder, NP . Lyda Jester, PA . Audry Riles, PharmD   Please be sure to bring in all your medications bottles to every appointment.

## 2020-05-23 NOTE — Telephone Encounter (Signed)
Called for peer to peer on [name] PET scan.  Reviewed with Dr. 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  She has since undergone neoadjuvant chemotherapy and underwent lumpectomy on 6/23 that showed no residual disease in the breast, and 1/2 lymph nodes were positive. With [name] having 1/2 lymph nodes positive, it brings into question what type of response her other hypermetabolic lymph nodes had, which is why we ordered the PET scan.  Dr. Lily Kocher said that doesn't meet criteria for approval as she hasn't had a CT scan in the adjuvant setting that is unclear.  I reviewed with him, that this instance was an uncommon situation, and that a CT scan would not be helpful because we need to compare hypermetabolism on PET scans.    Auth # [id number] thorough 07/07/2020.    Jacqueline Bihari, NP

## 2020-05-24 ENCOUNTER — Other Ambulatory Visit: Payer: Self-pay | Admitting: *Deleted

## 2020-05-27 ENCOUNTER — Telehealth (HOSPITAL_COMMUNITY): Payer: Self-pay | Admitting: Cardiology

## 2020-05-27 ENCOUNTER — Inpatient Hospital Stay: Payer: 59 | Attending: Oncology | Admitting: Medical

## 2020-05-27 ENCOUNTER — Other Ambulatory Visit: Payer: Self-pay

## 2020-05-27 VITALS — BP 124/53 | HR 85 | Temp 99.7°F | Resp 16 | Ht <= 58 in | Wt 211.4 lb

## 2020-05-27 DIAGNOSIS — C50912 Malignant neoplasm of unspecified site of left female breast: Secondary | ICD-10-CM | POA: Insufficient documentation

## 2020-05-27 DIAGNOSIS — Z803 Family history of malignant neoplasm of breast: Secondary | ICD-10-CM | POA: Diagnosis not present

## 2020-05-27 DIAGNOSIS — Z171 Estrogen receptor negative status [ER-]: Secondary | ICD-10-CM | POA: Insufficient documentation

## 2020-05-27 DIAGNOSIS — Z6841 Body Mass Index (BMI) 40.0 and over, adult: Secondary | ICD-10-CM | POA: Insufficient documentation

## 2020-05-27 DIAGNOSIS — Z90722 Acquired absence of ovaries, bilateral: Secondary | ICD-10-CM | POA: Insufficient documentation

## 2020-05-27 DIAGNOSIS — C541 Malignant neoplasm of endometrium: Secondary | ICD-10-CM

## 2020-05-27 DIAGNOSIS — I10 Essential (primary) hypertension: Secondary | ICD-10-CM | POA: Insufficient documentation

## 2020-05-27 DIAGNOSIS — E785 Hyperlipidemia, unspecified: Secondary | ICD-10-CM | POA: Diagnosis not present

## 2020-05-27 DIAGNOSIS — R05 Cough: Secondary | ICD-10-CM

## 2020-05-27 DIAGNOSIS — R059 Cough, unspecified: Secondary | ICD-10-CM

## 2020-05-27 DIAGNOSIS — Z8542 Personal history of malignant neoplasm of other parts of uterus: Secondary | ICD-10-CM | POA: Insufficient documentation

## 2020-05-27 DIAGNOSIS — Z5112 Encounter for antineoplastic immunotherapy: Secondary | ICD-10-CM | POA: Diagnosis present

## 2020-05-27 DIAGNOSIS — Z79899 Other long term (current) drug therapy: Secondary | ICD-10-CM | POA: Diagnosis not present

## 2020-05-27 DIAGNOSIS — Z801 Family history of malignant neoplasm of trachea, bronchus and lung: Secondary | ICD-10-CM | POA: Insufficient documentation

## 2020-05-27 DIAGNOSIS — Z8249 Family history of ischemic heart disease and other diseases of the circulatory system: Secondary | ICD-10-CM | POA: Insufficient documentation

## 2020-05-27 DIAGNOSIS — D649 Anemia, unspecified: Secondary | ICD-10-CM | POA: Insufficient documentation

## 2020-05-27 DIAGNOSIS — C773 Secondary and unspecified malignant neoplasm of axilla and upper limb lymph nodes: Secondary | ICD-10-CM | POA: Diagnosis present

## 2020-05-27 DIAGNOSIS — Z9079 Acquired absence of other genital organ(s): Secondary | ICD-10-CM | POA: Diagnosis not present

## 2020-05-27 DIAGNOSIS — Z90710 Acquired absence of both cervix and uterus: Secondary | ICD-10-CM | POA: Diagnosis not present

## 2020-05-27 DIAGNOSIS — Z1152 Encounter for screening for COVID-19: Secondary | ICD-10-CM | POA: Diagnosis not present

## 2020-05-27 DIAGNOSIS — E1040 Type 1 diabetes mellitus with diabetic neuropathy, unspecified: Secondary | ICD-10-CM | POA: Insufficient documentation

## 2020-05-27 LAB — SARS CORONAVIRUS 2 (TAT 6-24 HRS): SARS Coronavirus 2: NEGATIVE

## 2020-05-27 NOTE — Telephone Encounter (Signed)
Pt was seen 8/5 and sch for repeat echo and f/u appt on 9/10

## 2020-05-27 NOTE — Progress Notes (Signed)
The patient has been coughing for "several weeks" and presents to the clinic today to have a COVID-19 test collected prior to her upcoming chemotherapy later this week. Sample was collected for COVID-19 testing.  Sandi Mealy, MHS, PA-C Physician Assistant

## 2020-05-27 NOTE — Progress Notes (Signed)
Pt seen by PA Lucianne Lei only, no RN assessment at this time necessary per PA (pt here for screening test only).

## 2020-05-27 NOTE — Telephone Encounter (Signed)
She needs repeat echo because of Herceptin use and worsening heart failure symptoms.

## 2020-05-27 NOTE — Telephone Encounter (Signed)
I called patient regarding scheduling echo and she states that she has already had one.  Does patient still need echocardiogram? Please have your nurse reach out to patient.[name]

## 2020-05-28 ENCOUNTER — Telehealth: Payer: Self-pay | Admitting: *Deleted

## 2020-05-28 NOTE — Progress Notes (Signed)
FYI, [name]

## 2020-05-28 NOTE — Telephone Encounter (Signed)
This RN spoke with pt per her call- informed her of negative Covid test reading.  She states several concerns :  She states " I have never heard about the results of the CXR "  She states she is continuing to cough despite new medications of lasix (and potassium).  She states when she called last week she was told by covering RN " that Dr Jana Hakim was going to start a new medication but I do not know anything about it or has anyone discussed this with me "  She states she is concerned due to recent visit with Dr Aundra Dubin and noted cardiac issues.  This RN reviewed above - discussed new medication as kadcyla.  [name] has several concerns and would like to see the MD prior to initiating any changes as well as to discuss current status.  [name] also asked what she could use for her ongoing cough.  Pt's concerns will be reviewed with MD and call returned to the patient.

## 2020-05-29 ENCOUNTER — Other Ambulatory Visit: Payer: Self-pay | Admitting: *Deleted

## 2020-05-29 ENCOUNTER — Inpatient Hospital Stay (HOSPITAL_BASED_OUTPATIENT_CLINIC_OR_DEPARTMENT_OTHER): Payer: 59 | Admitting: Oncology

## 2020-05-29 ENCOUNTER — Other Ambulatory Visit: Payer: Self-pay

## 2020-05-29 VITALS — BP 115/93 | HR 82 | Temp 97.3°F | Resp 18 | Ht 59.0 in | Wt 211.3 lb

## 2020-05-29 DIAGNOSIS — Z171 Estrogen receptor negative status [ER-]: Secondary | ICD-10-CM

## 2020-05-29 DIAGNOSIS — Z5112 Encounter for antineoplastic immunotherapy: Secondary | ICD-10-CM | POA: Diagnosis not present

## 2020-05-29 DIAGNOSIS — C541 Malignant neoplasm of endometrium: Secondary | ICD-10-CM

## 2020-05-29 DIAGNOSIS — C50812 Malignant neoplasm of overlapping sites of left female breast: Secondary | ICD-10-CM

## 2020-05-29 DIAGNOSIS — C50112 Malignant neoplasm of central portion of left female breast: Secondary | ICD-10-CM

## 2020-05-29 NOTE — Progress Notes (Signed)
vas11

## 2020-05-29 NOTE — Progress Notes (Signed)
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 T-DM1/Kadcyla.  I explained what that is, how it works, and what the possible toxicities side effects and complications are.  We will be starting that 06/20/2028.  Tomorrow she will still receive trastuzumab only.  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  My vote would be yes because I expect [name] to live a long time and I really do not want her to have spread to the chest wall which can be practically impossible to treat once it occurs.  Jennel has some superficial phlebitis in the left lower extremity.  She does not have significant swelling of the leg but she does have some pain which I cannot fully explain and I am going to get a Doppler tomorrow to make sure there is not a clot there.  I have also set her up for lumbar spine films to see if she has some treatable arthritis or other treatable source of her left lower extremity discomfort.  Otherwise she does  have a bothersome cough.  She has gone off her omeprazole.  She is scheduled for a cardiac CT 06/10/2020 and of course that will let us know if there is something in the lungs that we need to worry about quite aside from the heart issues  I will see her again on [id number] with her next treatment.  She knows to call for any other issue that may develop before then.  Total encounter time 40 minutes.*  [name]. Desmond Szabo, MD 05/29/20 4:04 PM Medical Oncology and Hematology [facility] [place], [place] [address] Tel. [phone]    Fax. [phone]   I, Wilburn Mylar, am acting as scribe for Dr. [name]. Mccall Will.  I, Lurline Del MD, have reviewed the above documentation for accuracy and completeness, and I agree with the above.   *Total Encounter Time as defined by the Centers for Medicare and Medicaid Services includes, in addition to the face-to-face time of a patient visit (documented in the note above) non-face-to-face time: obtaining and reviewing outside history, ordering and reviewing medications, tests or procedures, care coordination (communications with other health care professionals or caregivers) and documentation in the medical record.

## 2020-05-30 ENCOUNTER — Inpatient Hospital Stay: Payer: 59

## 2020-05-30 ENCOUNTER — Encounter: Payer: Self-pay | Admitting: Adult Health

## 2020-05-30 ENCOUNTER — Other Ambulatory Visit: Payer: Self-pay | Admitting: *Deleted

## 2020-05-30 ENCOUNTER — Inpatient Hospital Stay (HOSPITAL_BASED_OUTPATIENT_CLINIC_OR_DEPARTMENT_OTHER): Payer: 59 | Admitting: Adult Health

## 2020-05-30 ENCOUNTER — Ambulatory Visit (HOSPITAL_COMMUNITY)
Admission: RE | Admit: 2020-05-30 | Discharge: 2020-05-30 | Disposition: A | Discharge status: Home / Self Care | Payer: 59 | Source: Ambulatory Visit | Attending: Oncology | Admitting: Oncology

## 2020-05-30 ENCOUNTER — Other Ambulatory Visit: Payer: Self-pay

## 2020-05-30 VITALS — BP 114/74 | HR 78 | Temp 98.2°F | Resp 16

## 2020-05-30 VITALS — Ht 59.0 in | Wt 210.9 lb

## 2020-05-30 DIAGNOSIS — Z95828 Presence of other vascular implants and grafts: Secondary | ICD-10-CM

## 2020-05-30 DIAGNOSIS — Z171 Estrogen receptor negative status [ER-]: Secondary | ICD-10-CM

## 2020-05-30 DIAGNOSIS — Z5112 Encounter for antineoplastic immunotherapy: Secondary | ICD-10-CM | POA: Diagnosis not present

## 2020-05-30 DIAGNOSIS — C50812 Malignant neoplasm of overlapping sites of left female breast: Secondary | ICD-10-CM | POA: Diagnosis present

## 2020-05-30 DIAGNOSIS — C50112 Malignant neoplasm of central portion of left female breast: Secondary | ICD-10-CM

## 2020-05-30 DIAGNOSIS — C541 Malignant neoplasm of endometrium: Secondary | ICD-10-CM | POA: Diagnosis not present

## 2020-05-30 DIAGNOSIS — D649 Anemia, unspecified: Secondary | ICD-10-CM

## 2020-05-30 LAB — CBC WITH DIFFERENTIAL (CANCER CENTER ONLY)
Abs Immature Granulocytes: 0.06 10*3/uL (ref 0.00–0.07)
Basophils Absolute: 0 10*3/uL (ref 0.0–0.1)
Basophils Relative: 0 %
Eosinophils Absolute: 0.1 10*3/uL (ref 0.0–0.5)
Eosinophils Relative: 1 %
HCT: 22.3 % — ABNORMAL LOW (ref 36.0–46.0)
Hemoglobin: 6.9 g/dL — CL (ref 12.0–15.0)
Immature Granulocytes: 1 %
Lymphocytes Relative: 10 %
Lymphs Abs: 1.1 10*3/uL (ref 0.7–4.0)
MCH: 28 pg (ref 26.0–34.0)
MCHC: 30.9 g/dL (ref 30.0–36.0)
MCV: 90.7 fL (ref 80.0–100.0)
Monocytes Absolute: 1.1 10*3/uL — ABNORMAL HIGH (ref 0.1–1.0)
Monocytes Relative: 10 %
Neutro Abs: 8.9 10*3/uL — ABNORMAL HIGH (ref 1.7–7.7)
Neutrophils Relative %: 78 %
Platelet Count: 216 10*3/uL (ref 150–400)
RBC: 2.46 MIL/uL — ABNORMAL LOW (ref 3.87–5.11)
RDW: 14.2 % (ref 11.5–15.5)
WBC Count: 11.3 10*3/uL — ABNORMAL HIGH (ref 4.0–10.5)
nRBC: 0 % (ref 0.0–0.2)

## 2020-05-30 LAB — CMP (CANCER CENTER ONLY)
ALT: 24 U/L (ref 0–44)
AST: 19 U/L (ref 15–41)
Albumin: 2.3 g/dL — ABNORMAL LOW (ref 3.5–5.0)
Alkaline Phosphatase: 46 U/L (ref 38–126)
Anion gap: 9 (ref 5–15)
BUN: 32 mg/dL — ABNORMAL HIGH (ref 8–23)
CO2: 21 mmol/L — ABNORMAL LOW (ref 22–32)
Calcium: 9.9 mg/dL (ref 8.9–10.3)
Chloride: 100 mmol/L (ref 98–111)
Creatinine: 1.25 mg/dL — ABNORMAL HIGH (ref 0.44–1.00)
GFR, Est AFR Am: 54 mL/min — ABNORMAL LOW (ref >60)
GFR, Estimated: 46 mL/min — ABNORMAL LOW (ref >60)
Glucose, Bld: 178 mg/dL — ABNORMAL HIGH (ref 70–99)
Potassium: 4 mmol/L (ref 3.5–5.1)
Sodium: 130 mmol/L — ABNORMAL LOW (ref 135–145)
Total Bilirubin: 0.3 mg/dL (ref 0.3–1.2)
Total Protein: 10.8 g/dL — ABNORMAL HIGH (ref 6.5–8.1)

## 2020-05-30 LAB — PREPARE RBC (CROSSMATCH)

## 2020-05-30 LAB — SAMPLE TO BLOOD BANK

## 2020-05-30 MED ORDER — DIPHENHYDRAMINE HCL 25 MG PO CAPS
ORAL_CAPSULE | ORAL | Status: AC
  Filled 2020-05-30: qty 1

## 2020-05-30 MED ORDER — METHYLPREDNISOLONE SODIUM SUCC 125 MG IJ SOLR
INTRAMUSCULAR | Status: AC
  Filled 2020-05-30: qty 2

## 2020-05-30 MED ORDER — TRASTUZUMAB-ANNS CHEMO 150 MG IV SOLR: 6.0000 mg/kg | Freq: Once | INTRAVENOUS | Status: DC

## 2020-05-30 MED ORDER — FUROSEMIDE 10 MG/ML IJ SOLN
20.0000 mg | Freq: Once | INTRAMUSCULAR | Status: AC
  Administered 2020-05-30: 20 mg via INTRAVENOUS

## 2020-05-30 MED ORDER — ACETAMINOPHEN 325 MG PO TABS
650.0000 mg | ORAL_TABLET | Freq: Once | ORAL | Status: AC
  Administered 2020-05-30: 650 mg via ORAL

## 2020-05-30 MED ORDER — RIVAROXABAN 15 MG PO TABS: 15.0000 mg | ORAL_TABLET | Freq: Two times a day (BID) | ORAL | 0 refills | Status: DC

## 2020-05-30 MED ORDER — SODIUM CHLORIDE 0.9% FLUSH
10.0000 mL | Freq: Once | INTRAVENOUS | Status: AC
  Administered 2020-05-30: 10 mL via INTRAVENOUS
  Filled 2020-05-30: qty 10

## 2020-05-30 MED ORDER — FUROSEMIDE 10 MG/ML IJ SOLN
INTRAMUSCULAR | Status: AC
  Filled 2020-05-30: qty 2

## 2020-05-30 MED ORDER — SODIUM CHLORIDE 0.9 % IV SOLN
Freq: Once | INTRAVENOUS | Status: AC
  Filled 2020-05-30: qty 250

## 2020-05-30 MED ORDER — DIPHENHYDRAMINE HCL 25 MG PO CAPS: 25.0000 mg | ORAL_CAPSULE | Freq: Once | ORAL | Status: DC

## 2020-05-30 MED ORDER — SODIUM CHLORIDE 0.9% FLUSH
10.0000 mL | Freq: Once | INTRAVENOUS | Status: AC
  Administered 2020-05-30: 10 mL
  Filled 2020-05-30: qty 10

## 2020-05-30 MED ORDER — ACETAMINOPHEN 325 MG PO TABS
ORAL_TABLET | ORAL | Status: AC
  Filled 2020-05-30: qty 2

## 2020-05-30 MED ORDER — HEPARIN SOD (PORK) LOCK FLUSH 100 UNIT/ML IV SOLN
500.0000 [IU] | Freq: Once | INTRAVENOUS | Status: AC
  Administered 2020-05-30: 500 [IU] via INTRAVENOUS
  Filled 2020-05-30: qty 5

## 2020-05-30 MED ORDER — ACETAMINOPHEN 325 MG PO TABS: 650.0000 mg | ORAL_TABLET | Freq: Once | ORAL | Status: DC

## 2020-05-30 MED ORDER — DIPHENHYDRAMINE HCL 25 MG PO TABS
25.0000 mg | ORAL_TABLET | Freq: Once | ORAL | Status: AC
  Administered 2020-05-30: 25 mg via ORAL
  Filled 2020-05-30: qty 1

## 2020-05-30 MED ORDER — METHYLPREDNISOLONE SODIUM SUCC 125 MG IJ SOLR
60.0000 mg | INTRAMUSCULAR | Status: AC
  Administered 2020-05-30: 60 mg via INTRAVENOUS

## 2020-05-30 NOTE — Progress Notes (Signed)
Lower extremity venous has been completed.   Preliminary results in CV Proc.   Abram Sander 05/30/2020 1:25 PM

## 2020-05-30 NOTE — Progress Notes (Signed)
Patient reports she did not take po furosemide this am. Wilber Bihari, NP notified. Advised to administer Lasix 20 mg IV x 1 today and have patient hold her home dose for today. Patient aware and verbalizes understanding and agrees to plan of care. Also, due to other appt @ 1300, Wilber Bihari advised ok to infuse blood transfusion @ 300 mL/hr.  During blood transfusion, patient began c/o itching in groin area and bilateral arms. Transfusion paused. Wilber Bihari notified. Orders received, repeated, and confirmed. Patient medicated as documented in [facility]. Itching resolved to baseline. Transfusion resumed and completed without further incident. Patient taken via wheelchair to radiology dept for doppler.

## 2020-05-30 NOTE — Patient Instructions (Addendum)
You received furosemide (Lasix) in your IV during treatment today. Do NOT take today's dose when you get home.  Blood Transfusion, Adult A blood transfusion is a procedure in which you receive blood or a type of blood cell (blood component) through an IV. You may need a blood transfusion when your blood level is low. This may result from a bleeding disorder, illness, injury, or surgery. The blood may come from a donor. You may also be able to donate blood for yourself (autologous blood donation) before a planned surgery. The blood given in a transfusion is made up of different blood components. You may receive:  Red blood cells. These carry oxygen to the cells in the body.  Platelets. These help your blood to clot.  Plasma. This is the liquid part of your blood. It carries proteins and other substances throughout the body.  White blood cells. These help you fight infections. If you have hemophilia or another clotting disorder, you may also receive other types of blood products. Tell a health care provider about:  Any blood disorders you have.  Any previous reactions you have had during a blood transfusion.  Any allergies you have.  All medicines you are taking, including vitamins, herbs, eye drops, creams, and over-the-counter medicines.  Any surgeries you have had.  Any medical conditions you have, including any recent fever or cold symptoms.  Whether you are pregnant or may be pregnant. What are the risks? Generally, this is a safe procedure. However, problems may occur.  The most common problems include: ? A mild allergic reaction, such as red, swollen areas of skin (hives) and itching. ? Fever or chills. This may be the body's response to new blood cells received. This may occur during or up to 4 hours after the transfusion.  More serious problems may include: ? Transfusion-associated circulatory overload (TACO), or too much fluid in the lungs. This may cause breathing  problems. ? A serious allergic reaction, such as difficulty breathing or swelling around the face and lips. ? Transfusion-related acute lung injury (TRALI), which causes breathing difficulty and low oxygen in the blood. This can occur within hours of the transfusion or several days later. ? Iron overload. This can happen after receiving many blood transfusions over a period of time. ? Infection or virus being transmitted. This is rare because donated blood is carefully tested before it is given. ? Hemolytic transfusion reaction. This is rare. It happens when your body's defense system (immune system)tries to attack the new blood cells. Symptoms may include fever, chills, nausea, low blood pressure, and low back or chest pain. ? Transfusion-associated graft-versus-host disease (TAGVHD). This is rare. It happens when donated cells attack your body's healthy tissues. What happens before the procedure? Medicines Ask your health care provider about:  Changing or stopping your regular medicines. This is especially important if you are taking diabetes medicines or blood thinners.  Taking medicines such as aspirin and ibuprofen. These medicines can thin your blood. Do not take these medicines unless your health care provider tells you to take them.  Taking over-the-counter medicines, vitamins, herbs, and supplements. General instructions  Follow instructions from your health care provider about eating and drinking restrictions.  You will have a blood test to determine your blood type. This is necessary to know what kind of blood your body will accept and to match it to the donor blood.  If you are going to have a planned surgery, you may be able to do an autologous  blood donation. This may be done in case you need to have a transfusion.  You will have your temperature, blood pressure, and pulse monitored before the transfusion.  If you have had an allergic reaction to a transfusion in the past, you  may be given medicine to help prevent a reaction. This medicine may be given to you by mouth (orally) or through an IV.  Set aside time for the blood transfusion. This procedure generally takes 1-4 hours to complete. What happens during the procedure?   An IV will be inserted into one of your veins.  The bag of donated blood will be attached to your IV. The blood will then enter through your vein.  Your temperature, blood pressure, and pulse will be monitored regularly during the transfusion. This monitoring is done to detect early signs of a transfusion reaction.  Tell your nurse right away if you have any of these symptoms during the transfusion: ? Shortness of breath or trouble breathing. ? Chest or back pain. ? Fever or chills. ? Hives or itching.  If you have any signs or symptoms of a reaction, your transfusion will be stopped and you may be given medicine.  When the transfusion is complete, your IV will be removed.  Pressure may be applied to the IV site for a few minutes.  A bandage (dressing)will be applied. The procedure may vary among health care providers and hospitals. What happens after the procedure?  Your temperature, blood pressure, pulse, breathing rate, and blood oxygen level will be monitored until you leave the hospital or clinic.  Your blood may be tested to see how you are responding to the transfusion.  You may be warmed with fluids or blankets to maintain a normal body temperature.  If you receive your blood transfusion in an outpatient setting, you will be told whom to contact to report any reactions. Where to find more information For more information on blood transfusions, visit the American Red Cross: redcross.org Summary  A blood transfusion is a procedure in which you receive blood or a type of blood cell (blood component) through an IV.  The blood you receive may come from a donor or be donated by yourself (autologous blood donation) before a  planned surgery.  The blood given in a transfusion is made up of different blood components. You may receive red blood cells, platelets, plasma, or white blood cells depending on the condition treated.  Your temperature, blood pressure, and pulse will be monitored before, during, and after the transfusion.  After the transfusion, your blood may be tested to see how your body has responded. This information is not intended to replace advice given to you by your health care provider. Make sure you discuss any questions you have with your health care provider. Document Revised: 03/30/2019 Document Reviewed: 03/30/2019 Elsevier Patient Education  [employer].

## 2020-05-30 NOTE — Patient Instructions (Signed)

## 2020-05-30 NOTE — Progress Notes (Signed)
Critical hgb 69.[name] Per Annabelle Harman, pt will receive 1 unit of RBC and treatment tomorrow

## 2020-05-30 NOTE — Patient Instructions (Signed)
Rivaroxaban oral tablets What is this medicine? RIVAROXABAN (ri va ROX a ban) is an anticoagulant (blood thinner). It is used to treat blood clots in the lungs or in the veins. It is also used to prevent blood clots in the lungs or in the veins. It is also used to lower the chance of stroke in people with a medical condition called atrial fibrillation. This medicine may be used for other purposes; ask your health care provider or pharmacist if you have questions. COMMON BRAND NAME(S): Xarelto, Xarelto Starter Pack What should I tell my health care provider before I take this medicine? They need to know if you have any of these conditions:  antiphospholipid antibody syndrome  artificial heart valve  bleeding disorders  bleeding in the brain  blood in your stools (black or tarry stools) or if you have blood in your vomit  history of blood clots  history of stomach bleeding  kidney disease  liver disease  low blood counts, like low white cell, platelet, or red cell counts  recent or planned spinal or epidural procedure  take medicines that treat or prevent blood clots  an unusual or allergic reaction to rivaroxaban, other medicines, foods, dyes, or preservatives  pregnant or trying to get pregnant  breast-feeding How should I use this medicine? Take this medicine by mouth with a glass of water. Follow the directions on the prescription label. Take your medicine at regular intervals. Do not take it more often than directed. Do not stop taking except on your doctor's advice. Stopping this medicine may increase your risk of a blood clot. Be sure to refill your prescription before you run out of medicine. If you are taking this medicine after hip or knee replacement surgery, take it with or without food. If you are taking this medicine for atrial fibrillation, take it with your evening meal. If you are taking this medicine to treat blood clots, take it with food at the same time each  day. If you are unable to swallow your tablet, you may crush the tablet and mix it in applesauce. Then, immediately eat the applesauce. You should eat more food right after you eat the applesauce containing the crushed tablet. Talk to your pediatrician regarding the use of this medicine in children. Special care may be needed. Overdosage: If you think you have taken too much of this medicine contact a poison control center or emergency room at once. NOTE: This medicine is only for you. Do not share this medicine with others. What if I miss a dose? If you take your medicine once a day and miss a dose, take the missed dose as soon as you remember. If it is almost time for your next dose, take only that dose. Do not take double or extra doses. If you take your medicine twice a day and miss a dose, take the missed dose immediately. In this instance, 2 tablets may be taken at the same time. The next day you should take 1 tablet twice a day as directed. What may interact with this medicine? Do not take this medicine with any of the following medications:  defibrotide This medicine may also interact with the following medications:  aspirin and aspirin-like medicines  certain antibiotics like erythromycin, azithromycin, and clarithromycin  certain medicines for fungal infections like ketoconazole and itraconazole  certain medicines for irregular heart beat like amiodarone, quinidine, dronedarone  certain medicines for seizures like carbamazepine, phenytoin  certain medicines that treat or prevent blood clots  like warfarin, enoxaparin, and dalteparin  conivaptan  felodipine  indinavir  lopinavir; ritonavir  NSAIDS, medicines for pain and inflammation, like ibuprofen or naproxen  ranolazine  rifampin  ritonavir  SNRIs, medicines for depression, like desvenlafaxine, duloxetine, levomilnacipran, venlafaxine  SSRIs, medicines for depression, like citalopram, escitalopram, fluoxetine,  fluvoxamine, paroxetine, sertraline  St. John's wort  verapamil This list may not describe all possible interactions. Give your health care provider a list of all the medicines, herbs, non-prescription drugs, or dietary supplements you use. Also tell them if you smoke, drink alcohol, or use illegal drugs. Some items may interact with your medicine. What should I watch for while using this medicine? Visit your healthcare professional for regular checks on your progress. You may need blood work done while you are taking this medicine. Your condition will be monitored carefully while you are receiving this medicine. It is important not to miss any appointments. Avoid sports and activities that might cause injury while you are using this medicine. Severe falls or injuries can cause unseen bleeding. Be careful when using sharp tools or knives. Consider using an Copy. Take special care brushing or flossing your teeth. Report any injuries, bruising, or red spots on the skin to your healthcare professional. If you are going to need surgery or other procedure, tell your healthcare professional that you are taking this medicine. Wear a medical ID bracelet or chain. Carry a card that describes your disease and details of your medicine and dosage times. What side effects may I notice from receiving this medicine? Side effects that you should report to your doctor or health care professional as soon as possible:  allergic reactions like skin rash, itching or hives, swelling of the face, lips, or tongue  back pain  redness, blistering, peeling or loosening of the skin, including inside the mouth  signs and symptoms of bleeding such as bloody or black, tarry stools; red or dark-brown urine; spitting up blood or brown material that looks like coffee grounds; red spots on the skin; unusual bruising or bleeding from the eye, gums, or nose  signs and symptoms of a blood clot such as chest pain;  shortness of breath; pain, swelling, or warmth in the leg  signs and symptoms of a stroke such as changes in vision; confusion; trouble speaking or understanding; severe headaches; sudden numbness or weakness of the face, arm or leg; trouble walking; dizziness; loss of coordination Side effects that usually do not require medical attention (report to your doctor or health care professional if they continue or are bothersome):  dizziness  muscle pain This list may not describe all possible side effects. Call your doctor for medical advice about side effects. You may report side effects to FDA at 1-800-FDA-1088. Where should I keep my medicine? Keep out of the reach of children. Store at room temperature between 15 and 30 degrees C (59 and 86 degrees F). Throw away any unused medicine after the expiration date. NOTE: This sheet is a summary. It may not cover all possible information. If you have questions about this medicine, talk to your doctor, pharmacist, or health care provider.  2020 Elsevier/Gold Standard (2019-01-02 09:45:59)

## 2020-05-30 NOTE — Progress Notes (Signed)
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 lymph node biopsy 10/30/2019 documents invasive mammary carcinoma, grade 3, estrogen receptor positive (80%, weak), progesterone receptor negative, HER-2 amplified (3+) MIB-70%  (a) breast MRI 11/23/2019 shows 3.6 cm non-mass-like enhancement in the left breast, with a second more clumped area measuring 4.8 cm, and at least 5 morphologically abnormal left axillary lymph node.  There is a left subpectoral lymph node and a left internal mammary lymph node noted as well.  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  (b) pertuzumab held with cycle 2 because of persistent diarrhea  (c) anti-HER2 therapy held after cycle 4 because of a drop in EF  (d) carbo/gemzar stopped after cycle 5, last dose 02/13/2020  (13) anti-HER-2 treatment resumed after surgery to be continued indefinitely  (a) echo 11/09/2019 shows an ejection fraction in the 60-65% range  (b) echo 02/09/2020 shows an ejection fraction in the 45 to 50% range  (c) echo 03/26/2020 shows an ejection fraction in the 55-60% range  (d) trastuzumab resumed 03/28/2020  (e) echo 05/06/2020 shows an ejection fraction in the 55-60% range  (f) switch to TDM 1/Kadcyla starting 06/20/2020  (14) left breast lumpectomy and axillary node dissection on 04/10/2020 showed no residual carcinoma in the breast but 1 of 2 sampled lymph nodes positive for cancer; the tumor deposit measured 2.0 cm without extranodal extension    (a) repeat prognostic panel requested 05/29/2020  (15) adjuvant radiation pending   PLAN: [name] unfortunately has 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  She will need to start a blood thinner.  I counseled her on Xarelto.  She will start with 75m PO BID x 3 weeks and then will transition to 20 mg per day thereafter.  I reviewed that the risk of the blood clot in the leg is that it can break off and go to her lungs and cause a fatal event called a pulmonary embolism.  She understands this.  I let her know that a lot of times, in situations like hers, anticoagulation has to be lifelong, due to risk factors.  Her risk factor includes metastatic breast cancer.  She also recently had surgery.  I reviewed with her that Dr. MJana Hakimis currently out of town, however he will sit down and talk with her in detail at her next appointment.  [name] also anemic.  She will be transfused.  We gave her stool cards today for her to complete so we can evaluate if she is losing any blood in her GI system.    I was  able to review the above with Dr. MJana Hakimtoday, and he is in agreement with her current plan. She will return on 06/20/2020 for labs, f/u with Dr. MJana Hakim and her next trastuzumab.  She knows to call for any questions that may arise between now and her next appointment.  We are happy to see her sooner if needed.   Total encounter time 30 minutes.*Wilber Bihari NP 05/30/20 1:56 PM [facility] [id number]. [phone]   Fax.  [phone]  *Total Encounter Time as defined by the Centers for Medicare and Medicaid Services includes, in addition to the face-to-face time of a patient visit (documented in the note above) non-face-to-face time: obtaining and reviewing outside history, ordering and reviewing medications, tests or procedures, care coordination (communications with other health care professionals or caregivers) and documentation in the medical record.

## 2020-05-31 ENCOUNTER — Other Ambulatory Visit: Payer: Self-pay

## 2020-05-31 ENCOUNTER — Inpatient Hospital Stay: Payer: 59

## 2020-05-31 VITALS — BP 132/61 | HR 74 | Temp 98.3°F | Resp 16

## 2020-05-31 DIAGNOSIS — C50812 Malignant neoplasm of overlapping sites of left female breast: Secondary | ICD-10-CM

## 2020-05-31 DIAGNOSIS — C50112 Malignant neoplasm of central portion of left female breast: Secondary | ICD-10-CM

## 2020-05-31 DIAGNOSIS — Z5112 Encounter for antineoplastic immunotherapy: Secondary | ICD-10-CM | POA: Diagnosis not present

## 2020-05-31 DIAGNOSIS — Z171 Estrogen receptor negative status [ER-]: Secondary | ICD-10-CM

## 2020-05-31 LAB — PREPARE RBC (CROSSMATCH)

## 2020-05-31 MED ORDER — SODIUM CHLORIDE 0.9% FLUSH
10.0000 mL | INTRAVENOUS | Status: DC | PRN
  Administered 2020-05-31: 10 mL
  Filled 2020-05-31: qty 10

## 2020-05-31 MED ORDER — ACETAMINOPHEN 325 MG PO TABS
650.0000 mg | ORAL_TABLET | Freq: Once | ORAL | Status: AC
  Administered 2020-05-31: 650 mg via ORAL

## 2020-05-31 MED ORDER — SODIUM CHLORIDE 0.9% IV SOLUTION
250.0000 mL | Freq: Once | INTRAVENOUS | Status: AC
  Administered 2020-05-31: 250 mL via INTRAVENOUS
  Filled 2020-05-31: qty 250

## 2020-05-31 MED ORDER — ACETAMINOPHEN 325 MG PO TABS
ORAL_TABLET | ORAL | Status: AC
  Filled 2020-05-31: qty 2

## 2020-05-31 MED ORDER — TRASTUZUMAB-ANNS CHEMO 150 MG IV SOLR
600.0000 mg | Freq: Once | INTRAVENOUS | Status: AC
  Administered 2020-05-31: 600 mg via INTRAVENOUS
  Filled 2020-05-31: qty 28.57

## 2020-05-31 MED ORDER — HEPARIN SOD (PORK) LOCK FLUSH 100 UNIT/ML IV SOLN
500.0000 [IU] | Freq: Once | INTRAVENOUS | Status: AC | PRN
  Administered 2020-05-31: 500 [IU]
  Filled 2020-05-31: qty 5

## 2020-05-31 MED ORDER — SODIUM CHLORIDE 0.9 % IV SOLN
Freq: Once | INTRAVENOUS | Status: AC
  Filled 2020-05-31: qty 250

## 2020-05-31 MED ORDER — DIPHENHYDRAMINE HCL 25 MG PO CAPS
ORAL_CAPSULE | ORAL | Status: AC
  Filled 2020-05-31: qty 1

## 2020-05-31 MED ORDER — DIPHENHYDRAMINE HCL 25 MG PO CAPS
25.0000 mg | ORAL_CAPSULE | Freq: Once | ORAL | Status: AC
  Administered 2020-05-31: 25 mg via ORAL

## 2020-05-31 NOTE — Patient Instructions (Signed)
[facility] Discharge Instructions for Patients Receiving Chemotherapy  Today you received the following chemotherapy agents Transtuzumab.  To help prevent nausea and vomiting after your treatment, we encourage you to take your nausea medication    If you develop nausea and vomiting that is not controlled by your nausea medication, call the clinic.   BELOW ARE SYMPTOMS THAT SHOULD BE REPORTED IMMEDIATELY:  *FEVER GREATER THAN 100.5 F  *CHILLS WITH OR WITHOUT FEVER  NAUSEA AND VOMITING THAT IS NOT CONTROLLED WITH YOUR NAUSEA MEDICATION  *UNUSUAL SHORTNESS OF BREATH  *UNUSUAL BRUISING OR BLEEDING  TENDERNESS IN MOUTH AND THROAT WITH OR WITHOUT PRESENCE OF ULCERS  *URINARY PROBLEMS  *BOWEL PROBLEMS  UNUSUAL RASH Items with * indicate a potential emergency and should be followed up as soon as possible.  Feel free to call the clinic should you have any questions or concerns. The clinic phone number is [phone].  Please show the [facility] at check-in to the Emergency Department and triage nurse.   https://www.redcrossblood.org/donate-blood/blood-donation-process/what-happens-to-donated-blood/blood-transfusions/types-of-blood-transfusions.html"> https://www.hematology.org/education/patients/blood-basics/blood-safety-and-matching"> https://www.nhlbi.nih.gov/health-topics/blood-transfusion">  Blood Transfusion, Adult A blood transfusion is a procedure in which you receive blood or a type of blood cell (blood component) through an IV. You may need a blood transfusion when your blood level is low. This may result from a bleeding disorder, illness, injury, or surgery. The blood may come from a donor. You may also be able to donate blood for yourself (autologous blood donation) before a planned surgery. The blood given in a transfusion is made up of different blood components. You may receive:  Red blood cells. These carry oxygen to the cells in the  body.  Platelets. These help your blood to clot.  Plasma. This is the liquid part of your blood. It carries proteins and other substances throughout the body.  White blood cells. These help you fight infections. If you have hemophilia or another clotting disorder, you may also receive other types of blood products. Tell a health care provider about:  Any blood disorders you have.  Any previous reactions you have had during a blood transfusion.  Any allergies you have.  All medicines you are taking, including vitamins, herbs, eye drops, creams, and over-the-counter medicines.  Any surgeries you have had.  Any medical conditions you have, including any recent fever or cold symptoms.  Whether you are pregnant or may be pregnant. What are the risks? Generally, this is a safe procedure. However, problems may occur.  The most common problems include: ? A mild allergic reaction, such as red, swollen areas of skin (hives) and itching. ? Fever or chills. This may be the body's response to new blood cells received. This may occur during or up to 4 hours after the transfusion.  More serious problems may include: ? Transfusion-associated circulatory overload (TACO), or too much fluid in the lungs. This may cause breathing problems. ? A serious allergic reaction, such as difficulty breathing or swelling around the face and lips. ? Transfusion-related acute lung injury (TRALI), which causes breathing difficulty and low oxygen in the blood. This can occur within hours of the transfusion or several days later. ? Iron overload. This can happen after receiving many blood transfusions over a period of time. ? Infection or virus being transmitted. This is rare because donated blood is carefully tested before it is given. ? Hemolytic transfusion reaction. This is rare. It happens when your body's defense system (immune system)tries to attack the new blood cells. Symptoms may include fever, chills,  nausea,  low blood pressure, and low back or chest pain. ? Transfusion-associated graft-versus-host disease (TAGVHD). This is rare. It happens when donated cells attack your body's healthy tissues. What happens before the procedure? Medicines Ask your health care provider about:  Changing or stopping your regular medicines. This is especially important if you are taking diabetes medicines or blood thinners.  Taking medicines such as aspirin and ibuprofen. These medicines can thin your blood. Do not take these medicines unless your health care provider tells you to take them.  Taking over-the-counter medicines, vitamins, herbs, and supplements. General instructions  Follow instructions from your health care provider about eating and drinking restrictions.  You will have a blood test to determine your blood type. This is necessary to know what kind of blood your body will accept and to match it to the donor blood.  If you are going to have a planned surgery, you may be able to do an autologous blood donation. This may be done in case you need to have a transfusion.  You will have your temperature, blood pressure, and pulse monitored before the transfusion.  If you have had an allergic reaction to a transfusion in the past, you may be given medicine to help prevent a reaction. This medicine may be given to you by mouth (orally) or through an IV.  Set aside time for the blood transfusion. This procedure generally takes 1-4 hours to complete. What happens during the procedure?   An IV will be inserted into one of your veins.  The bag of donated blood will be attached to your IV. The blood will then enter through your vein.  Your temperature, blood pressure, and pulse will be monitored regularly during the transfusion. This monitoring is done to detect early signs of a transfusion reaction.  Tell your nurse right away if you have any of these symptoms during the transfusion: ? Shortness of  breath or trouble breathing. ? Chest or back pain. ? Fever or chills. ? Hives or itching.  If you have any signs or symptoms of a reaction, your transfusion will be stopped and you may be given medicine.  When the transfusion is complete, your IV will be removed.  Pressure may be applied to the IV site for a few minutes.  A bandage (dressing)will be applied. The procedure may vary among health care providers and hospitals. What happens after the procedure?  Your temperature, blood pressure, pulse, breathing rate, and blood oxygen level will be monitored until you leave the hospital or clinic.  Your blood may be tested to see how you are responding to the transfusion.  You may be warmed with fluids or blankets to maintain a normal body temperature.  If you receive your blood transfusion in an outpatient setting, you will be told whom to contact to report any reactions. Where to find more information For more information on blood transfusions, visit the American Red Cross: redcross.org Summary  A blood transfusion is a procedure in which you receive blood or a type of blood cell (blood component) through an IV.  The blood you receive may come from a donor or be donated by yourself (autologous blood donation) before a planned surgery.  The blood given in a transfusion is made up of different blood components. You may receive red blood cells, platelets, plasma, or white blood cells depending on the condition treated.  Your temperature, blood pressure, and pulse will be monitored before, during, and after the transfusion.  After the transfusion, your  blood may be tested to see how your body has responded. This information is not intended to replace advice given to you by your health care provider. Make sure you discuss any questions you have with your health care provider. Document Revised: 03/30/2019 Document Reviewed: 03/30/2019 Elsevier Patient Education  [place].

## 2020-05-31 NOTE — Progress Notes (Signed)
She can take 12.5 mg Coreg that morning, can get lopressor in radiology if needs further lowering.

## 2020-06-01 ENCOUNTER — Encounter: Payer: Self-pay | Admitting: Adult Health

## 2020-06-01 LAB — BPAM RBC
Blood Product Expiration Date: 202109062359
Blood Product Expiration Date: 202109062359
ISSUE DATE / TIME: 202108121107
ISSUE DATE / TIME: 202108131415
Unit Type and Rh: 6200
Unit Type and Rh: 6200

## 2020-06-01 LAB — TYPE AND SCREEN
ABO/RH(D): A POS
Antibody Screen: NEGATIVE
Unit division: 0
Unit division: 0

## 2020-06-03 ENCOUNTER — Other Ambulatory Visit: Payer: Self-pay

## 2020-06-03 ENCOUNTER — Ambulatory Visit (HOSPITAL_COMMUNITY)
Admission: RE | Admit: 2020-06-03 | Discharge: 2020-06-03 | Disposition: A | Discharge status: Home / Self Care | Payer: 59 | Source: Ambulatory Visit | Attending: Internal Medicine | Admitting: Internal Medicine

## 2020-06-03 DIAGNOSIS — I429 Cardiomyopathy, unspecified: Secondary | ICD-10-CM | POA: Diagnosis present

## 2020-06-03 LAB — BASIC METABOLIC PANEL
Anion gap: 9 (ref 5–15)
BUN: 25 mg/dL — ABNORMAL HIGH (ref 8–23)
CO2: 24 mmol/L (ref 22–32)
Calcium: 9.4 mg/dL (ref 8.9–10.3)
Chloride: 101 mmol/L (ref 98–111)
Creatinine, Ser: 1.07 mg/dL — ABNORMAL HIGH (ref 0.44–1.00)
GFR calc Af Amer: >60 mL/min (ref >60)
GFR calc non Af Amer: 56 mL/min — ABNORMAL LOW (ref >60)
Glucose, Bld: 138 mg/dL — ABNORMAL HIGH (ref 70–99)
Potassium: 3.8 mmol/L (ref 3.5–5.1)
Sodium: 134 mmol/L — ABNORMAL LOW (ref 135–145)

## 2020-06-07 ENCOUNTER — Telehealth (HOSPITAL_COMMUNITY): Payer: Self-pay | Admitting: Emergency Medicine

## 2020-06-07 DIAGNOSIS — R079 Chest pain, unspecified: Secondary | ICD-10-CM

## 2020-06-07 NOTE — Telephone Encounter (Signed)
Reaching out to patient to offer assistance regarding upcoming cardiac imaging study; pt verbalizes understanding of appt date/time, parking situation and where to check in, pre-test NPO status and medications ordered, and verified current allergies; name and call back number provided for further questions should they arise Marchia Bond RN Navigator Cardiac Imaging [facility] [phone] office [phone] cell   NO BP/PUNCTURES TO LEFT ARM  12.5mg  coreg 2 hr prior to scan

## 2020-06-10 ENCOUNTER — Other Ambulatory Visit: Payer: Self-pay

## 2020-06-10 ENCOUNTER — Ambulatory Visit (HOSPITAL_COMMUNITY)
Admission: RE | Admit: 2020-06-10 | Discharge: 2020-06-10 | Disposition: A | Discharge status: Home / Self Care | Payer: 59 | Source: Ambulatory Visit | Attending: Cardiology | Admitting: Cardiology

## 2020-06-10 ENCOUNTER — Other Ambulatory Visit (HOSPITAL_COMMUNITY): Payer: Self-pay | Admitting: Cardiology

## 2020-06-10 DIAGNOSIS — R079 Chest pain, unspecified: Secondary | ICD-10-CM

## 2020-06-10 MED ORDER — IOHEXOL 350 MG/ML SOLN
80.0000 mL | Freq: Once | INTRAVENOUS | Status: AC | PRN
  Administered 2020-06-10: 80 mL via INTRAVENOUS

## 2020-06-10 MED ORDER — NITROGLYCERIN 0.4 MG SL SUBL
SUBLINGUAL_TABLET | SUBLINGUAL | Status: AC
  Administered 2020-06-10: 0.8 mg via SUBLINGUAL
  Filled 2020-06-10: qty 2

## 2020-06-10 MED ORDER — NITROGLYCERIN 0.4 MG SL SUBL: 0.8000 mg | SUBLINGUAL_TABLET | Freq: Once | SUBLINGUAL | Status: AC

## 2020-06-10 NOTE — Progress Notes (Signed)
  Evaluation after Contrast Extravasation  Patient seen and examined immediately after contrast extravasation while in CT.  Exam: There is moderate swelling at the right upper arm area.  There is no erythema. There is no discoloration. There are no blisters. There are no signs of decreased perfusion of the skin.  It is appropriately warm to the touch.  The patient has full ROM in fingers.  Radial pulse is normal.  Per contrast extravasation protocol, I have instructed the patient to keep an ice pack on the area for 20-60 minutes at a time for about 48 hours.   Keep arm elevated as much as possible.   The patient understands to call the radiology department if there is: - increase in pain or swelling - changed or altered sensation - ulceration or blistering - increasing redness - warmth or increasing firmness - decreased tissue perfusion as noted by decreased capillary refill or discoloration of skin - decreased pulses peripheral to site   Theresa Duty, NP 06/10/2020 11:27 AM

## 2020-06-11 ENCOUNTER — Other Ambulatory Visit: Payer: Self-pay | Admitting: Adult Health

## 2020-06-11 ENCOUNTER — Telehealth: Payer: Self-pay | Admitting: Radiology

## 2020-06-11 DIAGNOSIS — C541 Malignant neoplasm of endometrium: Secondary | ICD-10-CM

## 2020-06-11 DIAGNOSIS — C50112 Malignant neoplasm of central portion of left female breast: Secondary | ICD-10-CM

## 2020-06-11 NOTE — Addendum Note (Signed)
Addended by: Scarlette Calico on: 06/11/2020 02:21 PM   Modules accepted: Orders

## 2020-06-11 NOTE — Progress Notes (Signed)
   Pt was seen in Radiology with contrast extravasation 8/23  I have called pt 2 times this am to check on Rt arm NA LM to have pt call IR PA line at [phone] if any problems or questions.

## 2020-06-13 ENCOUNTER — Telehealth: Payer: Self-pay | Admitting: Student

## 2020-06-13 NOTE — Telephone Encounter (Signed)
Patient returned IR phone call regarding IV contrast extravasation. [facility] department for a CT heart scan on 06/10/20 and experienced an IV extravasation.   The patient states she has no swelling, tenderness or blistering at the IV site. She states she has no pain or discomfort in her arm or hand. Patient had questions regarding the results of her CT scan and I encouraged her to call Dr. Claris Gladden office for further information.   Soyla Dryer, [place] [phone] 06/13/2020, 12:12 PM

## 2020-06-19 ENCOUNTER — Other Ambulatory Visit: Payer: Self-pay | Admitting: *Deleted

## 2020-06-19 DIAGNOSIS — Z171 Estrogen receptor negative status [ER-]: Secondary | ICD-10-CM

## 2020-06-19 DIAGNOSIS — C50812 Malignant neoplasm of overlapping sites of left female breast: Secondary | ICD-10-CM

## 2020-06-19 NOTE — Progress Notes (Signed)
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 Rafe Mackowski, MD 06/20/20 8:36 AM Medical Oncology and Hematology [facility] [place], [place] [address] Tel. [phone]    Fax. [phone]   I, Wilburn Mylar, am acting as scribe for Dr. [name]. [name].  I, Lurline Del MD, have reviewed the above documentation for accuracy and completeness, and I agree with the above.    *Total Encounter Time as defined by the Centers for Medicare and Medicaid Services includes, in addition to the face-to-face time of a patient visit (documented in the note above) non-face-to-face time: obtaining and reviewing outside history, ordering and  reviewing medications, tests or procedures, care coordination (communications with other health care professionals or caregivers) and documentation in the medical record.

## 2020-06-20 ENCOUNTER — Other Ambulatory Visit: Payer: 59

## 2020-06-20 ENCOUNTER — Inpatient Hospital Stay: Payer: 59

## 2020-06-20 ENCOUNTER — Inpatient Hospital Stay (HOSPITAL_BASED_OUTPATIENT_CLINIC_OR_DEPARTMENT_OTHER): Payer: 59 | Admitting: Oncology

## 2020-06-20 ENCOUNTER — Ambulatory Visit: Payer: 59

## 2020-06-20 ENCOUNTER — Other Ambulatory Visit: Payer: Self-pay

## 2020-06-20 ENCOUNTER — Ambulatory Visit: Payer: 59 | Admitting: Adult Health

## 2020-06-20 ENCOUNTER — Other Ambulatory Visit: Payer: Self-pay | Admitting: Oncology

## 2020-06-20 ENCOUNTER — Telehealth: Payer: Self-pay | Admitting: Oncology

## 2020-06-20 ENCOUNTER — Inpatient Hospital Stay: Payer: 59 | Attending: Oncology

## 2020-06-20 VITALS — BP 115/78 | HR 69 | Temp 98.4°F | Resp 17 | Ht 59.0 in | Wt 206.8 lb

## 2020-06-20 DIAGNOSIS — D6481 Anemia due to antineoplastic chemotherapy: Secondary | ICD-10-CM | POA: Diagnosis not present

## 2020-06-20 DIAGNOSIS — C50812 Malignant neoplasm of overlapping sites of left female breast: Secondary | ICD-10-CM

## 2020-06-20 DIAGNOSIS — Z170 Estrogen receptor positive status [ER+]: Secondary | ICD-10-CM | POA: Diagnosis not present

## 2020-06-20 DIAGNOSIS — C773 Secondary and unspecified malignant neoplasm of axilla and upper limb lymph nodes: Secondary | ICD-10-CM | POA: Diagnosis present

## 2020-06-20 DIAGNOSIS — Z8542 Personal history of malignant neoplasm of other parts of uterus: Secondary | ICD-10-CM | POA: Diagnosis not present

## 2020-06-20 DIAGNOSIS — Z9079 Acquired absence of other genital organ(s): Secondary | ICD-10-CM | POA: Diagnosis not present

## 2020-06-20 DIAGNOSIS — Z7982 Long term (current) use of aspirin: Secondary | ICD-10-CM | POA: Insufficient documentation

## 2020-06-20 DIAGNOSIS — Z171 Estrogen receptor negative status [ER-]: Secondary | ICD-10-CM

## 2020-06-20 DIAGNOSIS — Z90722 Acquired absence of ovaries, bilateral: Secondary | ICD-10-CM | POA: Insufficient documentation

## 2020-06-20 DIAGNOSIS — Z5112 Encounter for antineoplastic immunotherapy: Secondary | ICD-10-CM | POA: Insufficient documentation

## 2020-06-20 DIAGNOSIS — T451X5A Adverse effect of antineoplastic and immunosuppressive drugs, initial encounter: Secondary | ICD-10-CM | POA: Diagnosis not present

## 2020-06-20 DIAGNOSIS — E785 Hyperlipidemia, unspecified: Secondary | ICD-10-CM | POA: Insufficient documentation

## 2020-06-20 DIAGNOSIS — I10 Essential (primary) hypertension: Secondary | ICD-10-CM | POA: Insufficient documentation

## 2020-06-20 DIAGNOSIS — Z79899 Other long term (current) drug therapy: Secondary | ICD-10-CM | POA: Insufficient documentation

## 2020-06-20 DIAGNOSIS — C50112 Malignant neoplasm of central portion of left female breast: Secondary | ICD-10-CM

## 2020-06-20 DIAGNOSIS — C541 Malignant neoplasm of endometrium: Secondary | ICD-10-CM

## 2020-06-20 DIAGNOSIS — Z90710 Acquired absence of both cervix and uterus: Secondary | ICD-10-CM | POA: Insufficient documentation

## 2020-06-20 DIAGNOSIS — C50912 Malignant neoplasm of unspecified site of left female breast: Secondary | ICD-10-CM | POA: Diagnosis present

## 2020-06-20 DIAGNOSIS — Z86718 Personal history of other venous thrombosis and embolism: Secondary | ICD-10-CM | POA: Insufficient documentation

## 2020-06-20 DIAGNOSIS — Z95828 Presence of other vascular implants and grafts: Secondary | ICD-10-CM

## 2020-06-20 LAB — CBC WITH DIFFERENTIAL (CANCER CENTER ONLY)
Abs Immature Granulocytes: 0.05 10*3/uL (ref 0.00–0.07)
Basophils Absolute: 0 10*3/uL (ref 0.0–0.1)
Basophils Relative: 0 %
Eosinophils Absolute: 0.1 10*3/uL (ref 0.0–0.5)
Eosinophils Relative: 1 %
HCT: 27.5 % — ABNORMAL LOW (ref 36.0–46.0)
Hemoglobin: 8.6 g/dL — ABNORMAL LOW (ref 12.0–15.0)
Immature Granulocytes: 1 %
Lymphocytes Relative: 15 %
Lymphs Abs: 1.3 10*3/uL (ref 0.7–4.0)
MCH: 27.9 pg (ref 26.0–34.0)
MCHC: 31.3 g/dL (ref 30.0–36.0)
MCV: 89.3 fL (ref 80.0–100.0)
Monocytes Absolute: 1 10*3/uL (ref 0.1–1.0)
Monocytes Relative: 11 %
Neutro Abs: 6.4 10*3/uL (ref 1.7–7.7)
Neutrophils Relative %: 72 %
Platelet Count: 313 10*3/uL (ref 150–400)
RBC: 3.08 MIL/uL — ABNORMAL LOW (ref 3.87–5.11)
RDW: 14.7 % (ref 11.5–15.5)
WBC Count: 8.9 10*3/uL (ref 4.0–10.5)
nRBC: 0 % (ref 0.0–0.2)

## 2020-06-20 LAB — CMP (CANCER CENTER ONLY)
ALT: 13 U/L (ref 0–44)
AST: 15 U/L (ref 15–41)
Albumin: 2.4 g/dL — ABNORMAL LOW (ref 3.5–5.0)
Alkaline Phosphatase: 47 U/L (ref 38–126)
Anion gap: 6 (ref 5–15)
BUN: 19 mg/dL (ref 8–23)
CO2: 27 mmol/L (ref 22–32)
Calcium: 10.4 mg/dL — ABNORMAL HIGH (ref 8.9–10.3)
Chloride: 101 mmol/L (ref 98–111)
Creatinine: 0.94 mg/dL (ref 0.44–1.00)
GFR, Est AFR Am: >60 mL/min (ref >60)
GFR, Estimated: >60 mL/min (ref >60)
Glucose, Bld: 131 mg/dL — ABNORMAL HIGH (ref 70–99)
Potassium: 3.7 mmol/L (ref 3.5–5.1)
Sodium: 134 mmol/L — ABNORMAL LOW (ref 135–145)
Total Bilirubin: 0.3 mg/dL (ref 0.3–1.2)
Total Protein: 10.2 g/dL — ABNORMAL HIGH (ref 6.5–8.1)

## 2020-06-20 MED ORDER — SODIUM CHLORIDE 0.9% FLUSH
10.0000 mL | Freq: Once | INTRAVENOUS | Status: AC
  Administered 2020-06-20: 10 mL
  Filled 2020-06-20: qty 10

## 2020-06-20 MED ORDER — EPOETIN ALFA-EPBX 40000 UNIT/ML IJ SOLN
40000.0000 [IU] | Freq: Once | INTRAMUSCULAR | Status: AC
  Administered 2020-06-20: 40000 [IU] via SUBCUTANEOUS

## 2020-06-20 MED ORDER — DIPHENHYDRAMINE HCL 25 MG PO CAPS
ORAL_CAPSULE | ORAL | Status: AC
  Filled 2020-06-20: qty 1

## 2020-06-20 MED ORDER — EPOETIN ALFA-EPBX 40000 UNIT/ML IJ SOLN
INTRAMUSCULAR | Status: AC
  Filled 2020-06-20: qty 1

## 2020-06-20 MED ORDER — ACETAMINOPHEN 325 MG PO TABS
650.0000 mg | ORAL_TABLET | Freq: Once | ORAL | Status: AC
  Administered 2020-06-20: 650 mg via ORAL

## 2020-06-20 MED ORDER — SODIUM CHLORIDE 0.9% FLUSH
10.0000 mL | INTRAVENOUS | Status: DC | PRN
  Administered 2020-06-20: 10 mL
  Filled 2020-06-20: qty 10

## 2020-06-20 MED ORDER — TRASTUZUMAB-ANNS CHEMO 150 MG IV SOLR
600.0000 mg | Freq: Once | INTRAVENOUS | Status: AC
  Administered 2020-06-20: 600 mg via INTRAVENOUS
  Filled 2020-06-20: qty 28.57

## 2020-06-20 MED ORDER — SODIUM CHLORIDE 0.9 % IV SOLN
Freq: Once | INTRAVENOUS | Status: AC
  Filled 2020-06-20: qty 250

## 2020-06-20 MED ORDER — ACETAMINOPHEN 325 MG PO TABS
ORAL_TABLET | ORAL | Status: AC
  Filled 2020-06-20: qty 2

## 2020-06-20 MED ORDER — HEPARIN SOD (PORK) LOCK FLUSH 100 UNIT/ML IV SOLN
500.0000 [IU] | Freq: Once | INTRAVENOUS | Status: AC | PRN
  Administered 2020-06-20: 500 [IU]
  Filled 2020-06-20: qty 5

## 2020-06-20 MED ORDER — DIPHENHYDRAMINE HCL 25 MG PO CAPS
25.0000 mg | ORAL_CAPSULE | Freq: Once | ORAL | Status: AC
  Administered 2020-06-20: 25 mg via ORAL

## 2020-06-20 NOTE — Patient Instructions (Signed)
[facility] Discharge Instructions for Patients Receiving Chemotherapy  Today you received the following chemotherapy agents Transtuzumab.  To help prevent nausea and vomiting after your treatment, we encourage you to take your nausea medication    If you develop nausea and vomiting that is not controlled by your nausea medication, call the clinic.   BELOW ARE SYMPTOMS THAT SHOULD BE REPORTED IMMEDIATELY:  *FEVER GREATER THAN 100.5 F  *CHILLS WITH OR WITHOUT FEVER  NAUSEA AND VOMITING THAT IS NOT CONTROLLED WITH YOUR NAUSEA MEDICATION  *UNUSUAL SHORTNESS OF BREATH  *UNUSUAL BRUISING OR BLEEDING  TENDERNESS IN MOUTH AND THROAT WITH OR WITHOUT PRESENCE OF ULCERS  *URINARY PROBLEMS  *BOWEL PROBLEMS  UNUSUAL RASH Items with * indicate a potential emergency and should be followed up as soon as possible.  Feel free to call the clinic should you have any questions or concerns. The clinic phone number is [phone].  Please show the [facility] at check-in to the Emergency Department and triage nurse.   https://www.redcrossblood.org/donate-blood/blood-donation-process/what-happens-to-donated-blood/blood-transfusions/types-of-blood-transfusions.html"> https://www.hematology.org/education/patients/blood-basics/blood-safety-and-matching"> https://www.nhlbi.nih.gov/health-topics/blood-transfusion">  Blood Transfusion, Adult A blood transfusion is a procedure in which you receive blood or a type of blood cell (blood component) through an IV. You may need a blood transfusion when your blood level is low. This may result from a bleeding disorder, illness, injury, or surgery. The blood may come from a donor. You may also be able to donate blood for yourself (autologous blood donation) before a planned surgery. The blood given in a transfusion is made up of different blood components. You may receive:  Red blood cells. These carry oxygen to the cells in the body.   Platelets. These help your blood to clot.  Plasma. This is the liquid part of your blood. It carries proteins and other substances throughout the body.  White blood cells. These help you fight infections. If you have hemophilia or another clotting disorder, you may also receive other types of blood products. Tell a health care provider about:  Any blood disorders you have.  Any previous reactions you have had during a blood transfusion.  Any allergies you have.  All medicines you are taking, including vitamins, herbs, eye drops, creams, and over-the-counter medicines.  Any surgeries you have had.  Any medical conditions you have, including any recent fever or cold symptoms.  Whether you are pregnant or may be pregnant. What are the risks? Generally, this is a safe procedure. However, problems may occur.  The most common problems include: ? A mild allergic reaction, such as red, swollen areas of skin (hives) and itching. ? Fever or chills. This may be the body's response to new blood cells received. This may occur during or up to 4 hours after the transfusion.  More serious problems may include: ? Transfusion-associated circulatory overload (TACO), or too much fluid in the lungs. This may cause breathing problems. ? A serious allergic reaction, such as difficulty breathing or swelling around the face and lips. ? Transfusion-related acute lung injury (TRALI), which causes breathing difficulty and low oxygen in the blood. This can occur within hours of the transfusion or several days later. ? Iron overload. This can happen after receiving many blood transfusions over a period of time. ? Infection or virus being transmitted. This is rare because donated blood is carefully tested before it is given. ? Hemolytic transfusion reaction. This is rare. It happens when your body's defense system (immune system)tries to attack the new blood cells. Symptoms may include fever, chills, nausea, low  blood pressure, and low back or chest pain. ? Transfusion-associated graft-versus-host disease (TAGVHD). This is rare. It happens when donated cells attack your body's healthy tissues. What happens before the procedure? Medicines Ask your health care provider about:  Changing or stopping your regular medicines. This is especially important if you are taking diabetes medicines or blood thinners.  Taking medicines such as aspirin and ibuprofen. These medicines can thin your blood. Do not take these medicines unless your health care provider tells you to take them.  Taking over-the-counter medicines, vitamins, herbs, and supplements. General instructions  Follow instructions from your health care provider about eating and drinking restrictions.  You will have a blood test to determine your blood type. This is necessary to know what kind of blood your body will accept and to match it to the donor blood.  If you are going to have a planned surgery, you may be able to do an autologous blood donation. This may be done in case you need to have a transfusion.  You will have your temperature, blood pressure, and pulse monitored before the transfusion.  If you have had an allergic reaction to a transfusion in the past, you may be given medicine to help prevent a reaction. This medicine may be given to you by mouth (orally) or through an IV.  Set aside time for the blood transfusion. This procedure generally takes 1-4 hours to complete. What happens during the procedure?   An IV will be inserted into one of your veins.  The bag of donated blood will be attached to your IV. The blood will then enter through your vein.  Your temperature, blood pressure, and pulse will be monitored regularly during the transfusion. This monitoring is done to detect early signs of a transfusion reaction.  Tell your nurse right away if you have any of these symptoms during the transfusion: ? Shortness of breath or  trouble breathing. ? Chest or back pain. ? Fever or chills. ? Hives or itching.  If you have any signs or symptoms of a reaction, your transfusion will be stopped and you may be given medicine.  When the transfusion is complete, your IV will be removed.  Pressure may be applied to the IV site for a few minutes.  A bandage (dressing)will be applied. The procedure may vary among health care providers and hospitals. What happens after the procedure?  Your temperature, blood pressure, pulse, breathing rate, and blood oxygen level will be monitored until you leave the hospital or clinic.  Your blood may be tested to see how you are responding to the transfusion.  You may be warmed with fluids or blankets to maintain a normal body temperature.  If you receive your blood transfusion in an outpatient setting, you will be told whom to contact to report any reactions. Where to find more information For more information on blood transfusions, visit the American Red Cross: redcross.org Summary  A blood transfusion is a procedure in which you receive blood or a type of blood cell (blood component) through an IV.  The blood you receive may come from a donor or be donated by yourself (autologous blood donation) before a planned surgery.  The blood given in a transfusion is made up of different blood components. You may receive red blood cells, platelets, plasma, or white blood cells depending on the condition treated.  Your temperature, blood pressure, and pulse will be monitored before, during, and after the transfusion.  After the transfusion, your blood  may be tested to see how your body has responded. This information is not intended to replace advice given to you by your health care provider. Make sure you discuss any questions you have with your health care provider. Document Revised: 03/30/2019 Document Reviewed: 03/30/2019 Elsevier Patient Education  [place].   Epoetin  Alfa injection What is this medicine? EPOETIN ALFA (e POE e tin AL fa) helps your body make more red blood cells. This medicine is used to treat anemia caused by chronic kidney disease, cancer chemotherapy, or HIV-therapy. It may also be used before surgery if you have anemia. This medicine may be used for other purposes; ask your health care provider or pharmacist if you have questions. COMMON BRAND NAME(S): Epogen, Procrit, Retacrit What should I tell my health care provider before I take this medicine? They need to know if you have any of these conditions:  cancer  heart disease  high blood pressure  history of blood clots  history of stroke  low levels of folate, iron, or vitamin B12 in the blood  seizures  an unusual or allergic reaction to erythropoietin, albumin, benzyl alcohol, hamster proteins, other medicines, foods, dyes, or preservatives  pregnant or trying to get pregnant  breast-feeding How should I use this medicine? This medicine is for injection into a vein or under the skin. It is usually given by a health care professional in a hospital or clinic setting. If you get this medicine at home, you will be taught how to prepare and give this medicine. Use exactly as directed. Take your medicine at regular intervals. Do not take your medicine more often than directed. It is important that you put your used needles and syringes in a special sharps container. Do not put them in a trash can. If you do not have a sharps container, call your pharmacist or healthcare provider to get one. A special MedGuide will be given to you by the pharmacist with each prescription and refill. Be sure to read this information carefully each time. Talk to your pediatrician regarding the use of this medicine in children. While this drug may be prescribed for selected conditions, precautions do apply. Overdosage: If you think you have taken too much of this medicine contact a poison control  center or emergency room at once. NOTE: This medicine is only for you. Do not share this medicine with others. What if I miss a dose? If you miss a dose, take it as soon as you can. If it is almost time for your next dose, take only that dose. Do not take double or extra doses. What may interact with this medicine? Interactions have not been studied. This list may not describe all possible interactions. Give your health care provider a list of all the medicines, herbs, non-prescription drugs, or dietary supplements you use. Also tell them if you smoke, drink alcohol, or use illegal drugs. Some items may interact with your medicine. What should I watch for while using this medicine? Your condition will be monitored carefully while you are receiving this medicine. You may need blood work done while you are taking this medicine. This medicine may cause a decrease in vitamin B6. You should make sure that you get enough vitamin B6 while you are taking this medicine. Discuss the foods you eat and the vitamins you take with your health care professional. What side effects may I notice from receiving this medicine? Side effects that you should report to your doctor or health  care professional as soon as possible:  allergic reactions like skin rash, itching or hives, swelling of the face, lips, or tongue  seizures  signs and symptoms of a blood clot such as breathing problems; changes in vision; chest pain; severe, sudden headache; pain, swelling, warmth in the leg; trouble speaking; sudden numbness or weakness of the face, arm or leg  signs and symptoms of a stroke like changes in vision; confusion; trouble speaking or understanding; severe headaches; sudden numbness or weakness of the face, arm or leg; trouble walking; dizziness; loss of balance or coordination Side effects that usually do not require medical attention (report to your doctor or health care professional if they continue or are  bothersome):  chills  cough  dizziness  fever  headaches  joint pain  muscle cramps  muscle pain  nausea, vomiting  pain, redness, or irritation at site where injected This list may not describe all possible side effects. Call your doctor for medical advice about side effects. You may report side effects to FDA at 1-800-FDA-1088. Where should I keep my medicine? Keep out of the reach of children. Store in a refrigerator between 2 and 8 degrees C (36 and 46 degrees F). Do not freeze or shake. Throw away any unused portion if using a single-dose vial. Multi-dose vials can be kept in the refrigerator for up to 21 days after the initial dose. Throw away unused medicine. NOTE: This sheet is a summary. It may not cover all possible information. If you have questions about this medicine, talk to your doctor, pharmacist, or health care provider.  2020 Elsevier/Gold Standard (2017-05-14 08:35:19)

## 2020-06-20 NOTE — Progress Notes (Signed)
ADDENDUM: Given [name] has mild hypercalcemia, and the possible benefit of bisphosphonates, I am going to start her on zoledronate, but I will wait until week after her next visit with me so as not to double up on new treatments (she will start [place] in 3 weeks).

## 2020-06-20 NOTE — Telephone Encounter (Signed)
Scheduled appts per 9/2 los. Gave pt a print out of AVS.

## 2020-06-25 ENCOUNTER — Telehealth (INDEPENDENT_AMBULATORY_CARE_PROVIDER_SITE_OTHER): Payer: 59 | Admitting: Internal Medicine

## 2020-06-25 DIAGNOSIS — R05 Cough: Secondary | ICD-10-CM

## 2020-06-25 DIAGNOSIS — E119 Type 2 diabetes mellitus without complications: Secondary | ICD-10-CM | POA: Diagnosis not present

## 2020-06-25 DIAGNOSIS — R059 Cough, unspecified: Secondary | ICD-10-CM

## 2020-06-25 DIAGNOSIS — R609 Edema, unspecified: Secondary | ICD-10-CM

## 2020-06-25 LAB — SURGICAL PATHOLOGY
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
## 2020-06-28 ENCOUNTER — Ambulatory Visit (HOSPITAL_COMMUNITY)
Admission: RE | Admit: 2020-06-28 | Discharge: 2020-06-28 | Disposition: A | Discharge status: Home / Self Care | Payer: 59 | Source: Ambulatory Visit | Attending: Cardiology | Admitting: Cardiology

## 2020-06-28 ENCOUNTER — Ambulatory Visit (HOSPITAL_BASED_OUTPATIENT_CLINIC_OR_DEPARTMENT_OTHER)
Admission: RE | Admit: 2020-06-28 | Discharge: 2020-06-28 | Disposition: A | Discharge status: Home / Self Care | Payer: 59 | Source: Ambulatory Visit | Attending: Cardiology | Admitting: Cardiology

## 2020-06-28 ENCOUNTER — Telehealth: Payer: Self-pay | Admitting: *Deleted

## 2020-06-28 ENCOUNTER — Other Ambulatory Visit: Payer: Self-pay

## 2020-06-28 VITALS — BP 170/100 | HR 70 | Ht 59.0 in | Wt 208.0 lb

## 2020-06-28 DIAGNOSIS — Z90710 Acquired absence of both cervix and uterus: Secondary | ICD-10-CM | POA: Insufficient documentation

## 2020-06-28 DIAGNOSIS — I429 Cardiomyopathy, unspecified: Secondary | ICD-10-CM | POA: Insufficient documentation

## 2020-06-28 DIAGNOSIS — Z8542 Personal history of malignant neoplasm of other parts of uterus: Secondary | ICD-10-CM | POA: Diagnosis not present

## 2020-06-28 DIAGNOSIS — I427 Cardiomyopathy due to drug and external agent: Secondary | ICD-10-CM | POA: Diagnosis not present

## 2020-06-28 DIAGNOSIS — I5043 Acute on chronic combined systolic (congestive) and diastolic (congestive) heart failure: Secondary | ICD-10-CM

## 2020-06-28 DIAGNOSIS — Z90722 Acquired absence of ovaries, bilateral: Secondary | ICD-10-CM | POA: Insufficient documentation

## 2020-06-28 DIAGNOSIS — Z79899 Other long term (current) drug therapy: Secondary | ICD-10-CM | POA: Insufficient documentation

## 2020-06-28 DIAGNOSIS — I5032 Chronic diastolic (congestive) heart failure: Secondary | ICD-10-CM | POA: Diagnosis not present

## 2020-06-28 DIAGNOSIS — T451X5A Adverse effect of antineoplastic and immunosuppressive drugs, initial encounter: Secondary | ICD-10-CM

## 2020-06-28 DIAGNOSIS — E785 Hyperlipidemia, unspecified: Secondary | ICD-10-CM | POA: Diagnosis not present

## 2020-06-28 DIAGNOSIS — Z86718 Personal history of other venous thrombosis and embolism: Secondary | ICD-10-CM | POA: Insufficient documentation

## 2020-06-28 DIAGNOSIS — Z8249 Family history of ischemic heart disease and other diseases of the circulatory system: Secondary | ICD-10-CM | POA: Insufficient documentation

## 2020-06-28 DIAGNOSIS — R0602 Shortness of breath: Secondary | ICD-10-CM | POA: Insufficient documentation

## 2020-06-28 DIAGNOSIS — E119 Type 2 diabetes mellitus without complications: Secondary | ICD-10-CM | POA: Insufficient documentation

## 2020-06-28 DIAGNOSIS — Z5181 Encounter for therapeutic drug level monitoring: Secondary | ICD-10-CM | POA: Diagnosis not present

## 2020-06-28 DIAGNOSIS — I110 Hypertensive heart disease with heart failure: Secondary | ICD-10-CM | POA: Diagnosis not present

## 2020-06-28 DIAGNOSIS — C50919 Malignant neoplasm of unspecified site of unspecified female breast: Secondary | ICD-10-CM | POA: Diagnosis not present

## 2020-06-28 DIAGNOSIS — Z803 Family history of malignant neoplasm of breast: Secondary | ICD-10-CM | POA: Insufficient documentation

## 2020-06-28 DIAGNOSIS — Z7901 Long term (current) use of anticoagulants: Secondary | ICD-10-CM | POA: Insufficient documentation

## 2020-06-28 LAB — ECHOCARDIOGRAM COMPLETE
Area-P 1/2: 3.27 cm2
S' Lateral: 3.2 cm

## 2020-06-28 MED ORDER — FUROSEMIDE 40 MG PO TABS: ORAL_TABLET | ORAL | 3 refills | Status: DC

## 2020-06-28 MED ORDER — RIVAROXABAN 20 MG PO TABS: 20.0000 mg | ORAL_TABLET | Freq: Every day | ORAL | 3 refills | Status: DC

## 2020-06-28 NOTE — Progress Notes (Signed)
  Echocardiogram 2D Echocardiogram has been performed.  [name] 06/28/2020, 3:10 PM

## 2020-06-28 NOTE — Patient Instructions (Addendum)
INCREASE Lasix to 40 mg in the AM and 20 mg in the PM  Labs needed in 10-14 days    Non-Cardiac CT scanning, (CAT scanning), is a noninvasive, special x-ray that produces cross-sectional images of the body using x-rays and a computer. CT scans help physicians diagnose and treat medical conditions. For some CT exams, a contrast material is used to enhance visibility in the area of the body being studied. CT scans provide greater clarity and reveal more details than regular x-ray exams.  Your physician recommends that you schedule a follow-up appointment in: 3 months with Dr Aundra Dubin and echo  Your physician has requested that you have an echocardiogram. Echocardiography is a painless test that uses sound waves to create images of your heart. It provides your doctor with information about the size and shape of your heart and how well your heart's chambers and valves are working. This procedure takes approximately one hour. There are no restrictions for this procedure.  If you have any questions or concerns before your next appointment please send [place] a message through [facility] or call our office at [phone].    TO LEAVE A MESSAGE FOR THE NURSE SELECT OPTION 2, PLEASE LEAVE A MESSAGE INCLUDING: . YOUR NAME . DATE OF BIRTH . CALL BACK NUMBER . REASON FOR CALL**this is important as we prioritize the call backs  YOU WILL RECEIVE A CALL BACK THE SAME DAY AS LONG AS YOU CALL BEFORE 4:00 PM

## 2020-06-28 NOTE — Progress Notes (Signed)
ReDS Vest / Clip - 06/28/20 1600      ReDS Vest / Clip   Station Marker B    Ruler Value 33    ReDS Value Range High volume overload    ReDS Actual Value 45    Anatomical Comments sitting

## 2020-06-28 NOTE — Telephone Encounter (Signed)
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  Informed pt refill will be sent but she will now transition to 20 mg daily for maintenance therapy.  [name] verbalized understanding of above and verified pharmacy.  Prescription sent.

## 2020-06-30 ENCOUNTER — Encounter: Payer: Self-pay | Admitting: Internal Medicine

## 2020-06-30 DIAGNOSIS — R059 Cough, unspecified: Secondary | ICD-10-CM | POA: Insufficient documentation

## 2020-06-30 NOTE — Assessment & Plan Note (Signed)
stable overall by history and exam, recent data reviewed with pt, and pt to continue medical treatment as before,  to f/u any worsening symptoms or concerns  

## 2020-06-30 NOTE — Patient Instructions (Signed)
Please take all new medication as prescribed - the robitussion dm, tessalon perle  Please continue all other medications as before, and refills have been done if requested.  Please have the pharmacy call with any other refills you may need.  Please continue your efforts at being more active, low cholesterol diet, and weight control.  Please keep your appointments with your specialists as you may have planned

## 2020-06-30 NOTE — Assessment & Plan Note (Signed)
Also for compressions stockings

## 2020-06-30 NOTE — Assessment & Plan Note (Addendum)
Etiology unclear, ? Viral vs post nasal allergy gtt, for robitussin dm prn, and tessalon prn,  to f/u any worsening symptoms or concerns  I spent 21 minutes in preparing to see the patient by review of recent labs, imaging and procedures, obtaining and reviewing separately obtained history, communicating with the patient and family or caregiver, ordering medications, tests or procedures, and documenting clinical information in the EHR including the differential Dx, treatment, and any further evaluation and other management of cough, edema, dm

## 2020-06-30 NOTE — Progress Notes (Signed)
Oncology: Dr. Jana Hakim  64 y.o. with history of DM, HTN, hyperlipidemia, endometrial cancer, and breast cancer was referred by Dr. Jana Hakim for cardio-oncology evaluation.  Initial breast cancer diagnosis was in 2012.  She had left breast lumpectomy and radiation.  Recurrence in 7/20, ER+/PR-/HER2+.  She had chemotherapy with trastuzumab/pertuzumab/carboplatin/docetaxol x 6 starting 2/21.  With fall in EF in 4/21, Herceptin was held.  Echo in 4/21 showed EF 45-50%, Coreg was started. Echo in 6/21 showed EF back up to 55-60%, and Herceptin was restarted.  In 6/21, she had lumpectomy.  Echo in 7/21 showed EF stable at 55-60%.    Echo was done today and reviewed, EF 55-60% with normal RV, strain images did not track well.   Patient returns for followup of diastolic CHF and chest pain.  She had exertional chest pain at last appointment.  IV infiltrated when trying to get coronary CTA, so it was not done.  She did have a calcium score scan with CAC 0 Agatston units.  She was found to have a left leg DVT and started on Xarelto (had pain in left calf).  She continues to have a chronic cough.  She was COVID-19 negative when the cough started last month.  She was having exertional chest tightness when I last saw her, now chest tightness is rare. She has bendopnea, no orthopnea/PND.  She does ok walking on flat ground but is short of breath with stairs and inclines.  No fever/chills.   REDS clip 45%  Labs (5/21): K 4.1, creatinine 0.89 Labs (7/21): K 4.2, creatinine 0.95 Labs (8/21): LDL 81, BNP 36 Labs (9/21): K 3.7, creatinine 0.94  PMH: 1. HTN 2. Diabetes 3. Endometrial carcinoma: 8/17 diagnosis, had hysterectomy and BSO.  4. Hyperlipidemia 5. Breast cancer: Initial diagnosis in 2012.  Had left breast lumpectomy and radiation.  Recurrence in 7/20, ER+/PR-/HER2+.  She had chemotherapy with trastuzumab/pertuzumab/carboplatin/docetaxol x 6 starting 2/21.  With fall in EF in 4/21, Herceptin was held.   Lumpectomy 6/21.  - Echo (1/21): EF 60-65%, GLS -20%, normal RV.  - Echo (4/21): EF 45-50%, global hypokinesis, GLS -12.6%, mildly decreased RV function.  - Echo (6/21): EF 55-60%, GLS -14.4%, mild LVH, PASP 38, RV normal.  - Echo (7/21): EF 55-60%, mild LVH, GLS -16.9%, RV  Normal - Echo (9/21): EF 11-57%, grade 2 diastolic dysfunction, GLS -11.6% (poor endocardial tracking), normal RV, normal IVC.  6. Coronary calcium score scan (8/21): CAC 0 Agatston units.  7. DVT: Left leg, 8/21.   Social History   Socioeconomic History  . Marital status: Divorced    Spouse name: Not on file  . Number of children: 0  . Years of education: Not on file  . Highest education level: Not on file  Occupational History  . Occupation: Education officer, museum    Employer: [employer]  Tobacco Use  . Smoking status: Never Smoker  . Smokeless tobacco: Never Used  Vaping Use  . Vaping Use: Never used  Substance and Sexual Activity  . Alcohol use: Yes    Comment: occasional  . Drug use: No  . Sexual activity: Not Currently  Other Topics Concern  . Not on file  Social History Narrative   1 son deceased with homicide   Patient has been divorced twice   Social Determinants of Health   Financial Resource Strain:   . Difficulty of Paying Living Expenses: Not on file  Food Insecurity:   . Worried About Charity fundraiser in the Last  Year: Not on file  . Ran Out of Food in the Last Year: Not on file  Transportation Needs:   . Lack of Transportation (Medical): Not on file  . Lack of Transportation (Non-Medical): Not on file  Physical Activity:   . Days of Exercise per Week: Not on file  . Minutes of Exercise per Session: Not on file  Stress:   . Feeling of Stress : Not on file  Social Connections:   . Frequency of Communication with Friends and Family: Not on file  . Frequency of Social Gatherings with Friends and Family: Not on file  . Attends Religious Services: Not on file  . Active Member of  Clubs or Organizations: Not on file  . Attends Archivist Meetings: Not on file  . Marital Status: Not on file  Intimate Partner Violence:   . Fear of Current or Ex-Partner: Not on file  . Emotionally Abused: Not on file  . Physically Abused: Not on file  . Sexually Abused: Not on file   Family History  Problem Relation Age of Onset  . Diabetes Mother   . Hypertension Mother   . Stroke Mother   . Heart disease Father        COPD  . Alcohol abuse Father        ETOH dependence  . Breast cancer Maternal Aunt        dx in her 57s  . Lung cancer Maternal Uncle   . Breast cancer Paternal Aunt   . Cancer Maternal Grandmother        salivary gland cancer  . Colon cancer Neg Hx    ROS: All systems reviewed and negative except as per HPI.   Current Outpatient Medications  Medication Sig Dispense Refill  . carvedilol (COREG) 6.25 MG tablet Take 1 tablet (6.25 mg total) by mouth 2 (two) times daily. 180 tablet 3  . Continuous Blood Gluc Sensor (FREESTYLE LIBRE 14 DAY SENSOR) MISC INJECT 1 SENSOR TO THE SKIN EVERY 14 DAYS FOR CONTINUOUS GLUCOSE MONITORING.    . furosemide (LASIX) 40 MG tablet Take 1 tablet (40 mg total) by mouth in the morning AND 0.5 tablets (20 mg total) every evening. 45 tablet 3  . glucose blood (ONE TOUCH ULTRA TEST) test strip 1 each by Other route 2 (two) times daily. Use to check blood sugars twice a day Dx E11.9 100 each 0  . Lancets (ONETOUCH ULTRASOFT) lancets 1 each by Other route 2 (two) times daily. Use to check blood sugars twice a day Dx E11.9 100 each 0  . NON FORMULARY Use to check blood glucose daily.  Dx E11.65    . omeprazole (PRILOSEC) 40 MG capsule Take 40 mg by mouth daily.    . potassium chloride SA (KLOR-CON) 20 MEQ tablet Take 1 tablet (20 mEq total) by mouth daily. 30 tablet 3  . pravastatin (PRAVACHOL) 40 MG tablet TAKE 1 TABLET BY MOUTH EVERY DAY 30 tablet 11  . rivaroxaban (XARELTO) 20 MG TABS tablet Take 1 tablet (20 mg total) by  mouth daily with supper. 30 tablet 3  . silver sulfADIAZINE (SILVADENE) 1 % cream Apply 1 application topically daily. (Patient taking differently: Apply 1 application topically as needed. ) 50 g 0  . valACYclovir (VALTREX) 500 MG tablet Take 1 tablet (500 mg total) by mouth 2 (two) times daily. 60 tablet 5  . ondansetron (ZOFRAN ODT) 4 MG disintegrating tablet Take 1 tablet (4 mg total) by mouth every 8 (  eight) hours as needed for nausea or vomiting. (Patient not taking: Reported on 06/28/2020) 20 tablet 0   No current facility-administered medications for this encounter.   Facility-Administered Medications Ordered in Other Encounters  Medication Dose Route Frequency Provider Last Rate Last Admin  . cloNIDine (CATAPRES) tablet 0.1 mg  0.1 mg Oral Daily Harle Stanford., PA-C   0.1 mg at 11/21/19 1742   BP (!) 139/75   Pulse 70   Ht 4' 11" (1.499 m)   Wt 94.3 kg (208 lb)   LMP 05/18/2016   SpO2 100%   BMI 42.01 kg/m  General: NAD Neck: No JVD, no thyromegaly or thyroid nodule.  Lungs: Clear to auscultation bilaterally with normal respiratory effort. CV: Nondisplaced PMI.  Heart regular S1/S2, no S3/S4, no murmur.  No peripheral edema.  No carotid bruit.  Normal pedal pulses.  Abdomen: Soft, nontender, no hepatosplenomegaly, no distention.  Skin: Intact without lesions or rashes.  Neurologic: Alert and oriented x 3.  Psych: Normal affect. Extremities: No clubbing or cyanosis.  HEENT: Normal.   Assessment/Plan: 1. Chemotherapy-related cardiomyopathy: Fall in EF and strain in the setting of Herceptin use.  Echo in 4/21 with EF 45-50% with GLS -12.6%.  Herceptin was held, Coreg started, and echo in 6/21 showed EF up to 55-60% with GLS -14.4% => Herceptin restarted.  Echo in 7/21 with EF 55-60%, GLS more negative at -16.9%.  Echo was done today and reviewed, EF remains 55-60% with less negative strain at -11.6%.  However, strain images did not appear to track well.  Probably NYHA class II  symptoms.  Still with prominent cough.  COVID-19 was negative when cough started in 8/21.  By exam, she does not appear significantly volume overloaded.  However, REDS clip is 45% today, suggesting volume overload.  - Continue Coreg 6.25 mg bid and telmisartan.  - Increase Lasix to 40 qam/20 qpm.  BMET in 10 days.  - CXR PA/lateral given ongoing coughing.  Also of note, she is getting a PET scan soon.  - Repeat echo at 3 months.  2. HTN: BP mildly elevated. No changes for now.  3. Hyperlipidemia: Keep LDL at least < 100 with diabetes.  - Continue pravastatin.   4. Chest pain: Patient has had exertional chest tightness, not as much recently.  Coronary calcium score was 0 Agatston units, this is suggests low risk.  - I will try to reschedule the formal coronary CTA.  5. DVT: on left.   - Continue Xarelto long-term given DVT in setting of malignancy.   Followup in 3 months with echo.   Loralie Champagne 06/30/2020

## 2020-07-01 ENCOUNTER — Encounter (HOSPITAL_COMMUNITY)
Admission: RE | Admit: 2020-07-01 | Discharge: 2020-07-01 | Disposition: A | Discharge status: Home / Self Care | Payer: 59 | Source: Ambulatory Visit | Attending: Oncology | Admitting: Oncology

## 2020-07-01 ENCOUNTER — Telehealth (HOSPITAL_COMMUNITY): Payer: Self-pay

## 2020-07-01 ENCOUNTER — Other Ambulatory Visit: Payer: Self-pay

## 2020-07-01 DIAGNOSIS — C50812 Malignant neoplasm of overlapping sites of left female breast: Secondary | ICD-10-CM | POA: Diagnosis present

## 2020-07-01 DIAGNOSIS — Z171 Estrogen receptor negative status [ER-]: Secondary | ICD-10-CM | POA: Insufficient documentation

## 2020-07-01 LAB — GLUCOSE, CAPILLARY: Glucose-Capillary: 133 mg/dL — ABNORMAL HIGH (ref 70–99)

## 2020-07-01 MED ORDER — FLUDEOXYGLUCOSE F - 18 (FDG) INJECTION
10.3000 | Freq: Once | INTRAVENOUS | Status: AC | PRN
  Administered 2020-07-01: 10.3 via INTRAVENOUS

## 2020-07-01 NOTE — Telephone Encounter (Signed)
Malena Edman, RN  07/01/2020 5:06 PM EDT Back to Top    Patient advised and verbalized understanding

## 2020-07-01 NOTE — Telephone Encounter (Signed)
-----   Message from Larey Dresser, MD sent at 06/30/2020 10:37 AM EDT ----- Probably atelectasis and small effusion at left base.  Mediastinal adenopathy will be evaluated by PET (scheduled soon).

## 2020-07-02 ENCOUNTER — Encounter: Payer: Self-pay | Admitting: Gastroenterology

## 2020-07-02 ENCOUNTER — Ambulatory Visit (INDEPENDENT_AMBULATORY_CARE_PROVIDER_SITE_OTHER): Payer: 59 | Admitting: Gastroenterology

## 2020-07-02 ENCOUNTER — Other Ambulatory Visit (HOSPITAL_COMMUNITY): Payer: Self-pay | Admitting: *Deleted

## 2020-07-02 VITALS — BP 120/80 | HR 76 | Ht 59.0 in | Wt 207.0 lb

## 2020-07-02 DIAGNOSIS — R1310 Dysphagia, unspecified: Secondary | ICD-10-CM | POA: Diagnosis not present

## 2020-07-02 DIAGNOSIS — C50919 Malignant neoplasm of unspecified site of unspecified female breast: Secondary | ICD-10-CM

## 2020-07-02 DIAGNOSIS — D649 Anemia, unspecified: Secondary | ICD-10-CM

## 2020-07-02 DIAGNOSIS — R21 Rash and other nonspecific skin eruption: Secondary | ICD-10-CM

## 2020-07-02 DIAGNOSIS — R109 Unspecified abdominal pain: Secondary | ICD-10-CM | POA: Diagnosis not present

## 2020-07-02 DIAGNOSIS — K529 Noninfective gastroenteritis and colitis, unspecified: Secondary | ICD-10-CM

## 2020-07-02 DIAGNOSIS — Z7901 Long term (current) use of anticoagulants: Secondary | ICD-10-CM

## 2020-07-02 NOTE — Patient Instructions (Addendum)
If you are age 62 or older, your body mass index should be between 23-30. Your Body mass index is 41.81 kg/m. If this is out of the aforementioned range listed, please consider follow up with your Primary Care Provider.  If you are age 66 or younger, your body mass index should be between 19-25. Your Body mass index is 41.81 kg/m. If this is out of the aformentioned range listed, please consider follow up with your Primary Care Provider.   You have been scheduled for a modified barium swallow on ________________________  at _________ . Please arrive 15 minutes prior to your test for registration. You will go to  ________________ Radiology (1st Floor) for your appointment. Should you need to cancel or reschedule your appointment, please contact [phone] [address] [place]) or [phone] [place]). _____________________________________________________________________ A Modified Barium Swallow Study, or MBS, is a special x-ray that is taken to check swallowing skills. It is carried out by a Stage manager and a Psychologist, clinical (SLP). During this test, yourmouth, throat, and esophagus, a muscular tube which connects your mouth to your stomach, is checked. The test will help you, your doctor, and the SLP plan what types of foods and liquids are easier for you to swallow. The SLP will also identify positions and ways to help you swallow more easily and safely. What will happen during an MBS? You will be taken to an x-ray room and seated comfortably. You will be asked to swallow small amounts of food and liquid mixed with barium. Barium is a liquid or paste that allows images of your mouth, throat and esophagus to be seen on x-ray. The x-ray captures moving images of the food you are swallowing as it travels from your mouth through your throat and into your esophagus. This test helps identify whether food or liquid is entering your lungs (aspiration). The test also shows which part of your mouth  or throat lacks strength or coordination to move the food or liquid in the right direction. This test typically takes 30 minutes to 1 hour to complete. _______________________________________________________________________  Take your omeprazole every day.  Take imodium over the counter daily as needed for diarrhea.  Titrate up or down as needed.   Thank you for entrusting me with your care and for choosing [facility], Dr. [place]ellar

## 2020-07-02 NOTE — Progress Notes (Signed)
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 presentation if endoscopic evaluation is likely be high yield for this issue given results of her blood work thus far, unclear if she may need a bone marrow biopsy first.  I will await this discussion with Dr. Jana Hakim.  We can consider testing for occult blood of her stools. Will need monitor of her Hgb and transfusion in the interim if required.  As above, she is anticoagulated for recent DVT, no overt bleeding, but if we do pursue endoscopic evaluation we may not be able to hold anticoagulation  Abdominal pain - this has been ongoing for at least 5 years.  CT scan and PET scan shows no evidence of malignancy in that area.  There is no prandial relationship at all, she is quite tender to the area at baseline and she has positional pain.  Based on exam and history this may more likely be musculoskeletal in etiology, we discussed what this is.  Its intermittent and reliably improved with lying down, and that light she does not really want to take any medications for this.  We discussed the yield of pursuing an endoscopy or an  ultrasound to evaluate for gallstones or other causes, again without a prandial relationship I think yield of this may be low.  Will await conversation with Dr. Jana Hakim as above, if we pursue EGD for her anemia, can rule out upper tract causes to contribute to her pain.  In the interim she can take her omeprazole 40 mg every day and see if that helps her all, however her reflux symptoms appear pretty well controlled at baseline.  Metastatic breast cancer - as above, unclear if this is related to her causing her anemia.  She does have a new pleural effusion and worsening lymphadenopathy and will discuss with Dr. Jana Hakim. Hemodynamically stable in office today  Dysphagia - intermittent dysphagia to pills and liquids, as above per history this sounds more oropharyngeal.  Offered her modified barium swallow to evaluate this initially, as esophageal pathology seems a bit less likely.  Will await this result, she agreed wanted to proceed.  Chronic diarrhea - she has had this for several years leading to a colonoscopy for this issue 8 years ago, negative for microscopic colitis.  No evidence of IBD.  Probably has a chronic functional disorder.  She had C. difficile previously but had much more dramatic symptoms of that and those have resolved, back to normal baseline.  Would try Imodium every day and titrate up or down as needed.  She agreed  Rash - skin breakdown in the gluteal cleft, this is not a fissure as she questioned what this was.  She states she has seen 4 different dermatologists and is using Silvadene daily to the area, she states not really helping too much, she should follow-up with her dermatologist for this issue.  Reassured her she does not have an anal fissure.  I spent 60 minutes of time, including in depth chart review, independent review of results as outlined above, communicating results with the patient directly, face-to-face time with the patient, coordinating care, ordering studies and  medications as appropriate, and documentation.    [place] Cellar, MD [facility]  CC: Biagio Borg, MD

## 2020-07-04 ENCOUNTER — Other Ambulatory Visit: Payer: Self-pay

## 2020-07-04 ENCOUNTER — Encounter: Payer: Self-pay | Admitting: Neurology

## 2020-07-04 ENCOUNTER — Ambulatory Visit (INDEPENDENT_AMBULATORY_CARE_PROVIDER_SITE_OTHER): Payer: 59 | Admitting: Neurology

## 2020-07-04 ENCOUNTER — Telehealth: Payer: Self-pay | Admitting: Neurology

## 2020-07-04 ENCOUNTER — Other Ambulatory Visit: Payer: Self-pay | Admitting: Oncology

## 2020-07-04 VITALS — BP 126/92 | HR 75 | Ht 59.0 in | Wt 208.0 lb

## 2020-07-04 DIAGNOSIS — R29898 Other symptoms and signs involving the musculoskeletal system: Secondary | ICD-10-CM | POA: Diagnosis not present

## 2020-07-04 DIAGNOSIS — R202 Paresthesia of skin: Secondary | ICD-10-CM | POA: Diagnosis not present

## 2020-07-04 DIAGNOSIS — R200 Anesthesia of skin: Secondary | ICD-10-CM

## 2020-07-04 DIAGNOSIS — C50112 Malignant neoplasm of central portion of left female breast: Secondary | ICD-10-CM

## 2020-07-04 MED ORDER — DICLOFENAC SODIUM 1 % EX GEL: CUTANEOUS | 11 refills | Status: DC

## 2020-07-04 MED ORDER — LIDOCAINE-PRILOCAINE 2.5-2.5 % EX CREA: TOPICAL_CREAM | CUTANEOUS | 5 refills | Status: DC

## 2020-07-04 MED ORDER — GABAPENTIN 100 MG PO CAPS: 100.0000 mg | ORAL_CAPSULE | Freq: Three times a day (TID) | ORAL | 6 refills | Status: DC

## 2020-07-04 NOTE — Progress Notes (Signed)
[name] presents today for follow-up to discuss possibility of pursuing adjuvant radiation and to review results from PET scan 07/01/2020  Patient reports she was recently prescribed gabapentin as well as Voltaren cream for the neuropathy in her right hand. She continues to deal with discomfort along her sternum from what she believes is GERD. Otherwise she reports she's doing well and has no new issues/complaints.   Wt Readings from Last 3 Encounters:  07/05/20 208 lb 9.6 oz (94.6 kg)  07/04/20 208 lb (94.3 kg)  07/02/20 207 lb (93.9 kg)   Vitals:   07/05/20 1017  BP: (!) 141/69  Pulse: 84  Resp: 20  Temp: 98.1 F (36.7 C)  SpO2: 100%
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
## 2020-07-04 NOTE — Telephone Encounter (Signed)
[employer] auth: Cervical: [id number] (exp. 07/04/20 to 08/18/20)  MR Shoulder pending.

## 2020-07-05 ENCOUNTER — Other Ambulatory Visit: Payer: Self-pay

## 2020-07-05 ENCOUNTER — Telehealth: Payer: Self-pay | Admitting: Gastroenterology

## 2020-07-05 ENCOUNTER — Other Ambulatory Visit (HOSPITAL_COMMUNITY): Payer: Self-pay | Admitting: Cardiology

## 2020-07-05 ENCOUNTER — Telehealth (HOSPITAL_COMMUNITY): Payer: Self-pay | Admitting: Emergency Medicine

## 2020-07-05 ENCOUNTER — Ambulatory Visit
Admission: RE | Admit: 2020-07-05 | Discharge: 2020-07-05 | Disposition: A | Discharge status: Home / Self Care | Payer: 59 | Source: Ambulatory Visit | Attending: Radiation Oncology | Admitting: Radiation Oncology

## 2020-07-05 VITALS — BP 141/69 | HR 84 | Temp 98.1°F | Resp 20 | Ht 59.0 in | Wt 208.6 lb

## 2020-07-05 DIAGNOSIS — Z171 Estrogen receptor negative status [ER-]: Secondary | ICD-10-CM

## 2020-07-05 DIAGNOSIS — K219 Gastro-esophageal reflux disease without esophagitis: Secondary | ICD-10-CM | POA: Insufficient documentation

## 2020-07-05 DIAGNOSIS — Z170 Estrogen receptor positive status [ER+]: Secondary | ICD-10-CM | POA: Insufficient documentation

## 2020-07-05 DIAGNOSIS — C50812 Malignant neoplasm of overlapping sites of left female breast: Secondary | ICD-10-CM

## 2020-07-05 DIAGNOSIS — G629 Polyneuropathy, unspecified: Secondary | ICD-10-CM | POA: Diagnosis not present

## 2020-07-05 DIAGNOSIS — C771 Secondary and unspecified malignant neoplasm of intrathoracic lymph nodes: Secondary | ICD-10-CM

## 2020-07-05 DIAGNOSIS — Z0000 Encounter for general adult medical examination without abnormal findings: Secondary | ICD-10-CM

## 2020-07-05 NOTE — Telephone Encounter (Signed)
Attempted to call patient to explain new CCTA appt.   06/10/20 - attempted CCTA but had a contrast extravasion. Only cal score was obtained.   Pt rescheduled for CCTA for 9/21 at 10:45a and needs an IR IV start to ensure correct IV placement for avoid another extravasion but IR isnt available.   Appt moved from 10:45a to 2:30p (with IR available at 1pm to start the IV).  Please have patient check in at radiology desk at 1p for IR IV Start for 2:30p Cardiac CT appt.  Marchia Bond RN Navigator Cardiac Imaging [facility] [phone] Office  [phone] Cell

## 2020-07-05 NOTE — Telephone Encounter (Signed)
Spoke with patient regarding information from Dr. Havery Moros. Patient is aware that Dr. Jana Hakim would like to do repeat iron studies, she is aware that we will hold off on endoscopic evaluation at this time as GI tract losses seem less likely at this time, she is aware that this could change depending on lab results. Advised that Dr. Jana Hakim will keep Dr. Havery Moros posted. Pt is aware and verbalized understanding.

## 2020-07-05 NOTE — Telephone Encounter (Signed)
[name] can you help relay the following: - I spoke with Dr. Jana Hakim about her case. He wants to do more blood work for iron studies again and review things with her. We will hold off on endoscopic evaluation of her anemia right now, as GI tract losses seem less likely right now, but that could change depending on results of her blood work and her course. Dr. Jana Hakim will keep me posted and we may need to consider endoscopic evaluation pending her course. Thanks

## 2020-07-08 ENCOUNTER — Other Ambulatory Visit: Payer: Self-pay

## 2020-07-08 ENCOUNTER — Ambulatory Visit (HOSPITAL_COMMUNITY)
Admission: RE | Admit: 2020-07-08 | Discharge: 2020-07-08 | Disposition: A | Discharge status: Home / Self Care | Payer: 59 | Source: Ambulatory Visit | Attending: Gastroenterology | Admitting: Gastroenterology

## 2020-07-08 ENCOUNTER — Telehealth (HOSPITAL_COMMUNITY): Payer: Self-pay | Admitting: *Deleted

## 2020-07-08 ENCOUNTER — Other Ambulatory Visit (HOSPITAL_COMMUNITY): Payer: 59

## 2020-07-08 DIAGNOSIS — R079 Chest pain, unspecified: Secondary | ICD-10-CM | POA: Insufficient documentation

## 2020-07-08 DIAGNOSIS — R1310 Dysphagia, unspecified: Secondary | ICD-10-CM

## 2020-07-08 NOTE — Telephone Encounter (Signed)
[employer] auth for MRI Shoulder: [id number] (exp. 07/04/20 to 08/18/20) order sent to GI. They will reach out to the patient to schedule.

## 2020-07-08 NOTE — Progress Notes (Signed)
Modified Barium Swallow Progress Note  Patient Details  Name: [name] MRN: [id number] Date of Birth: [birth year]  Today's Date: 07/08/2020  Modified Barium Swallow completed.  Full report located under Chart Review in the Imaging Section.  Brief recommendations include the following:  Clinical Impression  Pt exhibited functional oral and pharyngeal phases of swallow. Range of motion, strength and timing were normal to protect airway and without aspiration or pharyngeal residue. Her symptoms of pharyngeal global sensation appear related to her GERD likely resulting in globus sensation. She orally transited the barium pill after 3 trials of thin liquid. MBS does not diagnose the esophagus however pill cleared into stomach in a timely manner (barium stasis viewed and possibly slow to transit on brief observation). Pt educated on results and recommendations to continue regular (using caution with meat/bread), thin liquids, straws allowed and pills with liquid ( educated to use puree if difficulty with liquids). Pt given verbal and written instructions [id number].           Swallow Evaluation Recommendations       SLP Diet Recommendations: Regular solids;Thin liquid   Liquid Administration via: Cup;Straw   Medication Administration: Whole meds with liquid   Supervision: Patient able to self feed   Compensations: Other (Comment) (reflux precautions)   Postural Changes: Seated upright at 90 degrees;Remain semi-upright after after feeds/meals (Comment)   Oral Care Recommendations: Oral care BID        Houston Siren 07/08/2020,1:51 PM   Orbie Pyo Baldwin.Ed Risk analyst [phone] Office [phone]

## 2020-07-08 NOTE — Telephone Encounter (Signed)
Reaching out to patient to offer assistance regarding upcoming cardiac imaging study; pt verbalizes understanding of appt date/time, parking situation and where to check in, pre-test NPO status and medications ordered, and verified current allergies; name and call back number provided for further questions should they arise  [facility] [phone] office [phone] cell

## 2020-07-09 ENCOUNTER — Ambulatory Visit (HOSPITAL_COMMUNITY): Payer: 59

## 2020-07-09 ENCOUNTER — Ambulatory Visit (HOSPITAL_COMMUNITY)
Admission: RE | Admit: 2020-07-09 | Discharge: 2020-07-09 | Disposition: A | Discharge status: Home / Self Care | Payer: 59 | Source: Ambulatory Visit | Attending: Cardiology | Admitting: Cardiology

## 2020-07-09 ENCOUNTER — Other Ambulatory Visit: Payer: Self-pay

## 2020-07-09 ENCOUNTER — Encounter: Payer: Self-pay | Admitting: Radiation Oncology

## 2020-07-09 ENCOUNTER — Other Ambulatory Visit (HOSPITAL_COMMUNITY): Payer: Self-pay | Admitting: Cardiology

## 2020-07-09 DIAGNOSIS — D509 Iron deficiency anemia, unspecified: Secondary | ICD-10-CM

## 2020-07-09 DIAGNOSIS — R079 Chest pain, unspecified: Secondary | ICD-10-CM

## 2020-07-09 DIAGNOSIS — C771 Secondary and unspecified malignant neoplasm of intrathoracic lymph nodes: Secondary | ICD-10-CM | POA: Insufficient documentation

## 2020-07-09 DIAGNOSIS — Z0000 Encounter for general adult medical examination without abnormal findings: Secondary | ICD-10-CM

## 2020-07-09 HISTORY — PX: IR US GUIDE VASC ACCESS RIGHT: IMG2390

## 2020-07-09 HISTORY — PX: IR RADIOLOGY PERIPHERAL GUIDED IV START: IMG5598

## 2020-07-09 MED ORDER — IOHEXOL 350 MG/ML SOLN
80.0000 mL | Freq: Once | INTRAVENOUS | Status: AC | PRN
  Administered 2020-07-09: 80 mL via INTRAVENOUS

## 2020-07-09 MED ORDER — NITROGLYCERIN 0.4 MG SL SUBL
SUBLINGUAL_TABLET | SUBLINGUAL | Status: AC
  Filled 2020-07-09: qty 2

## 2020-07-09 MED ORDER — NITROGLYCERIN 0.4 MG SL SUBL
SUBLINGUAL_TABLET | SUBLINGUAL | Status: AC
  Administered 2020-07-09: 0.8 mg
  Filled 2020-07-09: qty 2

## 2020-07-09 MED ORDER — NITROGLYCERIN 0.4 MG SL SUBL: 0.8000 mg | SUBLINGUAL_TABLET | Freq: Once | SUBLINGUAL | Status: DC

## 2020-07-09 MED ORDER — LIDOCAINE HCL 1 % IJ SOLN
INTRAMUSCULAR | Status: AC
  Filled 2020-07-09: qty 20

## 2020-07-09 MED ORDER — METOPROLOL TARTRATE 5 MG/5ML IV SOLN
INTRAVENOUS | Status: AC
  Administered 2020-07-09: 5 mg
  Filled 2020-07-09: qty 5

## 2020-07-09 MED ORDER — METOPROLOL TARTRATE 5 MG/5ML IV SOLN: 5.0000 mg | INTRAVENOUS | Status: DC | PRN

## 2020-07-09 NOTE — Progress Notes (Signed)
Radiation Oncology         [phone] ________________________________  Outpatient follow-up  Name: [name] MRN: [id number]  Date: 07/05/2020  DOB: 09-08-1956  YY:TKPT, Hunt Oris, MD  Magrinat, [name], MD   REFERRING PHYSICIAN: Magrinat, [name], MD  DIAGNOSIS:    ICD-10-CM   1. 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 in female, estrogen receptor negative ([place])  C50.812    Z17.1   2. 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)  C77.1     CHIEF COMPLAINT: Here to discuss management of recurrent breast cancer  HISTORY OF PRESENT ILLNESS::[name] is a 64 y.o. female who has a previous history of left breast cancer diagnosed in 2012.  At that time she had micrometastatic disease in the axilla. pT1b, pN57mc with her pathologic stage.  Her cancer was moderately ER positive and strongly PR positive.  PR negative.  Grade 1.  Oncotype score was 21.  She received postoperative radiation under the care of Dr. WPablo Ledger  I reviewed her plan - see snapshot below.  She was treated with tangential fields to the left breast to a dose of 45 Gray in 25 fractions followed by a 16 Gy boost in 8 fractions. She took tamoxifen for approximately 3 years after radiotherapy and stopped this due to aches and pains.  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  For numerous reasons, the patient had hesitation to proceed with surgery.  At that time, mastectomy was recommended for cure.  During those months of delay, her cancer progressed -a PET scan upon follow-up in February 2021 showed a hypermetabolic mass in the left breast,   enlarged left axillary lymph node metastasis, hypermetabolic left subpectoralis lymph nodes, hypermetabolic central thoracic metastatic adenopathy with large prevascular lymph node and left internal mammary lymph node and  paratracheal lymph nodes.  I have personally reviewed her imaging.  MRI brain was negative for metastatic disease.  Her cancer upon repeat biopsy was deemed invasive mammary carcinoma, grade 3, 80% weakly estrogen positive, progesterone receptor negative, HER-2 positive.  She proceeded with neoadjuvant chemotherapy and trastuzumab.  The patient was discussed at tumor board after neoadjuvant treatment, and based on a good clinical response in the breast, as well as the known adenopathy in her mediastinum, it was felt that mastectomy would not improve her prognosis, and recommendations were made for breast conserving surgery.  She then proceeded to undergo lumpectomy and removal of 2 lymph nodes on June 23; one of the 2 lymph nodes was positive, noting 2 cm of residual cancer.  The other lymph node was negative.  The lumpectomy tissue showed no residual disease.  Pathologic stage ypT0, ypN1a  I initially met with her in July and the decision was made to restage her with a PET scan in September and assess the extent of her disease.  There was concern that the hypermetabolic activity in her chest was metastatic disease, and the evolution of these lymph nodes would probably drive decision making for her treatment.  Today she reports she was recently prescribed gabapentin as well as Voltaren cream for the neuropathy in her right hand. She continues to deal with discomfort along her sternum from what she believes is GERD. Otherwise she reports she's doing well and has no new issues/complaints.    I have personally reviewed her PET scan from July 01, 2020  and I have shared the images with the patient.  This is notable for significant progression of bulky nodal metastases within the chest.  There is SUV uptake in the left axilla.  There is also new SUV uptake in supraclavicular regions bilaterally.  There is a left pleural effusion of unknown  etiology.  There is a small pericardial effusion.  There is no  evidence of metastatic disease to the abdomen and pelvis.    PREVIOUS RADIATION THERAPY: Yes as above  PAST MEDICAL HISTORY:  has a past medical history of Abscess of buttock, Allergy, Anemia, Arthritis, Bacterial infection, Boil of buttock, Breast cancer ([place]), COLONIC POLYPS, HX OF (05/11/2008), Diabetes mellitus without complication ([place]), Dysrhythmia, Eczema, Endometrial cancer ([place]) (06/11/2016), Family history of breast cancer, Genital herpes (10/01/2017), GENITAL HERPES, HX OF (08/08/2009), H/O gonorrhea, H/O hiatal hernia, H/O irritable bowel syndrome, Headache, Hematoma, Hernia, HTN (hypertension) (10/01/2017), HYPERLIPIDEMIA (05/11/2008), Hypertension, 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    PAST SURGICAL HISTORY: Past Surgical History:  Procedure Laterality Date  . ABDOMINAL HYSTERECTOMY    . AXILLARY LYMPH NODE DISSECTION    . BREAST CYST EXCISION  1973  . BREAST LUMPECTOMY    . BREAST LUMPECTOMY WITH NEEDLE LOCALIZATION Right 12/20/2013   Procedure: EXCISION RIGHT BREAST MASS WITH NEEDLE LOCALIZATION;  Surgeon: Stark Klein, MD;  Location: [facility];  Service: General;  Laterality: Right;  . BREAST LUMPECTOMY WITH RADIOACTIVE SEED AND AXILLARY LYMPH NODE DISSECTION Left 04/10/2020   Procedure: LEFT BREAST LUMPECTOMY WITH RADIOACTIVE SEED AND TARGETED AXILLARY LYMPH NODE DISSECTION;  Surgeon: Donnie Mesa, MD;  Location: [facility];  Service: General;  Laterality: Left;  LMA, PEC BLOCK  . CESAREAN SECTION     x 1  . COLONOSCOPY    . DILATATION & CURRETTAGE/HYSTEROSCOPY WITH RESECTOCOPE N/A 06/05/2016   Procedure: DILATATION & CURETTAGE/HYSTEROSCOPY;  Surgeon: Eldred Manges, MD;  Location: [facility];  Service: Gynecology;  Laterality: N/A;  . DILATION AND CURETTAGE OF UTERUS    . left achilles tendon repair    . PORTACATH PLACEMENT Right 11/16/2019   Procedure: INSERTION PORT-A-CATH WITH ULTRASOUND;  Surgeon: Donnie Mesa, MD;  Location: [facility];  Service: General;  Laterality: Right;  . right achilles tendon     and left  . right ovarian cyst     hx  . ROBOTIC ASSISTED TOTAL HYSTERECTOMY WITH BILATERAL SALPINGO OOPHERECTOMY Bilateral 06/16/2016   Procedure: XI ROBOTIC ASSISTED TOTAL HYSTERECTOMY WITH BILATERAL SALPINGO OOPHORECTOMY AND SENTINAL LYMPH NODE BIOPSY, MINI LAPAROTOMY;  Surgeon: Everitt Amber, MD;  Location: [facility];  Service: Gynecology;  Laterality: Bilateral;  . s/p ear surgury    . s/p extra uterine fibroid  2006  . s/p left knee replacement  2007  . TOTAL KNEE REVISION Left 07/22/2016   Procedure: TOTAL KNEE REVISION ARTHROPLASTY;  Surgeon: Gaynelle Arabian, MD;  Location: [facility];  Service: Orthopedics;  Laterality: Left;  . UTERINE FIBROID SURGERY  2006   x 1    FAMILY HISTORY: family history includes Alcohol abuse in her father; Breast cancer in her maternal aunt and paternal aunt; Cancer in her maternal grandmother; Diabetes in her mother; Heart disease in her father; Hypertension in her mother; Lung cancer in her maternal uncle; Stroke in her mother.  SOCIAL HISTORY:  reports that she has never smoked. She  has never used smokeless tobacco. She reports current alcohol use. She reports that she does not use drugs.  ALLERGIES: Morphine and related, Codeine, Cymbalta [duloxetine hcl], Darvon, Hydrocodone, Hydrocodone-acetaminophen, Oxycodone, Propoxyphene hcl, and Rosuvastatin  MEDICATIONS:  Current Outpatient Medications  Medication Sig Dispense Refill  . carvedilol (COREG) 6.25 MG tablet Take 1 tablet (6.25 mg total) by mouth 2 (two) times daily. 180 tablet 3  . Continuous Blood Gluc Sensor (FREESTYLE LIBRE 14 DAY SENSOR) MISC INJECT 1 SENSOR TO THE  SKIN EVERY 14 DAYS FOR CONTINUOUS GLUCOSE MONITORING.    . furosemide (LASIX) 40 MG tablet Take 1 tablet (40 mg total) by mouth in the morning AND 0.5 tablets (20 mg total) every evening. 45 tablet 3  . gabapentin (NEURONTIN) 100 MG capsule Take 1 capsule (100 mg total) by mouth 3 (three) times daily. 90 capsule 6  . glucose blood (ONE TOUCH ULTRA TEST) test strip 1 each by Other route 2 (two) times daily. Use to check blood sugars twice a day Dx E11.9 100 each 0  . Lancets (ONETOUCH ULTRASOFT) lancets 1 each by Other route 2 (two) times daily. Use to check blood sugars twice a day Dx E11.9 100 each 0  . lidocaine-prilocaine (EMLA) cream 4 gram tid prn 120 g 5  . NON FORMULARY Use to check blood glucose daily.  Dx E11.65    . omeprazole (PRILOSEC) 40 MG capsule Take 40 mg by mouth daily.    . potassium chloride SA (KLOR-CON) 20 MEQ tablet Take 1 tablet (20 mEq total) by mouth daily. 30 tablet 3  . pravastatin (PRAVACHOL) 40 MG tablet TAKE 1 TABLET BY MOUTH EVERY DAY 30 tablet 11  . rivaroxaban (XARELTO) 20 MG TABS tablet Take 1 tablet (20 mg total) by mouth daily with supper. 30 tablet 3  . valACYclovir (VALTREX) 500 MG tablet Take 1 tablet (500 mg total) by mouth 2 (two) times daily. 60 tablet 5  . diclofenac Sodium (VOLTAREN) 1 % GEL 4 gram tid prn (Patient not taking: Reported on 07/05/2020) 100 g 11  . silver sulfADIAZINE (SILVADENE) 1 % cream Apply 1 application topically daily. (Patient not taking: Reported on 07/05/2020) 50 g 0   No current facility-administered medications for this encounter.   Facility-Administered Medications Ordered in Other Encounters  Medication Dose Route Frequency Provider Last Rate Last Admin  . cloNIDine (CATAPRES) tablet 0.1 mg  0.1 mg Oral Daily Harle Stanford., PA-C   0.1 mg at 11/21/19 1742    REVIEW OF SYSTEMS: As above    PHYSICAL EXAM:  height is 4' 11"  (1.499 m) and weight is 208 lb 9.6 oz (94.6 kg). Her temperature is 98.1 F (36.7 C). Her blood  pressure is 141/69 (abnormal) and her pulse is 84. Her respiration is 20 and oxygen saturation is 100%.   General: Alert and oriented, in no acute distress Neck: Palpable adenopathy in the right supraclavicular region  Psychiatric: Judgment and insight are intact. Affect is appropriate. Heart is regular rate and rhythm Chest is clear to auscultation bilaterally with no rhonchi wheezes or rales   ECOG = 1  0 - Asymptomatic (Fully active, able to carry on all predisease activities without restriction)  1 - Symptomatic but completely ambulatory (Restricted in physically strenuous activity but ambulatory and able to carry out work of a light or sedentary nature. For example, light housework, office work)  2 - Symptomatic, <50% in bed during the day (Ambulatory and capable of all self care but unable to carry  out any work activities. Up and about more than 50% of waking hours)  3 - Symptomatic, >50% in bed, but not bedbound (Capable of only limited self-care, confined to bed or chair 50% or more of waking hours)  4 - Bedbound (Completely disabled. Cannot carry on any self-care. Totally confined to bed or chair)  5 - Death   Eustace Pen MM, Creech RH, Tormey DC, et al. [phone]). "Toxicity and response criteria of the [facility]". [place] Oncol. 5 (6): 649-55   LABORATORY DATA:  Lab Results  Component Value Date   WBC 8.9 06/20/2020   HGB 8.6 (L) 06/20/2020   HCT 27.5 (L) 06/20/2020   MCV 89.3 06/20/2020   PLT 313 06/20/2020   CMP     Component Value Date/Time   NA 134 (L) 06/20/2020 0806   NA 143 03/30/2017 1044   K 3.7 06/20/2020 0806   K 3.8 03/30/2017 1044   CL 101 06/20/2020 0806   CL 106 01/18/2013 0909   CO2 27 06/20/2020 0806   CO2 27 03/30/2017 1044   GLUCOSE 131 (H) 06/20/2020 0806   GLUCOSE 91 03/30/2017 1044   GLUCOSE 99 01/18/2013 0909   BUN 19 06/20/2020 0806   BUN 9.7 03/30/2017 1044   CREATININE 0.94 06/20/2020 0806   CREATININE 0.8  03/30/2017 1044   CALCIUM 10.4 (H) 06/20/2020 0806   CALCIUM 9.7 03/30/2017 1044   PROT 10.2 (H) 06/20/2020 0806   PROT 8.0 03/30/2017 1044   ALBUMIN 2.4 (L) 06/20/2020 0806   ALBUMIN 3.4 (L) 03/30/2017 1044   AST 15 06/20/2020 0806   AST 17 03/30/2017 1044   ALT 13 06/20/2020 0806   ALT 15 03/30/2017 1044   ALKPHOS 47 06/20/2020 0806   ALKPHOS 60 03/30/2017 1044   BILITOT 0.3 06/20/2020 0806   BILITOT 0.34 03/30/2017 1044   GFRNONAA >60 06/20/2020 0806   GFRAA >60 06/20/2020 0806         RADIOGRAPHY: As above   IMPRESSION/PLAN: This is a very nice woman with an unfortunate recurrence of left breast cancer.  This progressed rather quickly following her diagnosis of recurrence in July 2020.  Restaging scans in February showed that the disease had become invasive with locally advanced adenopathy as well as mediastinal adenopathy.  Since then she underwent a PET scan to reassess her disease this month.  It shows bulky adenopathy that has clearly progressed within her chest and also the supraclavicular regions bilaterally.  She also has suspicious adenopathy in the left axilla.  She has been receiving trastuzumab based therapy with Dr. Jana Hakim.  I spoke with Dr. Jana Hakim after the consultation about her imaging.  He and I reached a consensus that it would be prudent for him to escalate her systemic therapy and then reevaluate after few cycles with another PET scan.  We can consider consolidative radiation to the chest in the future.  Patient has a follow-up appointment with Dr. Jana Hakim next week.  I did talk with the patient briefly about potential role of palliative radiotherapy with the goal of local control in the chest, specifically to shrink down the dominant lymph node masses and prevent them from compressing her airways.  She understands that her final plan of care will be established after she talks with Dr. Jana Hakim.  I will see her back on an as-needed basis according to the  plan above.  She knows how to contact us if she has any questions in the interim.  On date of  service, in total, I spent 40 minutes on this encounter. Patient was seen in person.  __________________________________________   Eppie Gibson, MD

## 2020-07-10 ENCOUNTER — Telehealth: Payer: Self-pay

## 2020-07-10 NOTE — Progress Notes (Signed)
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 Stark Klein, MD;  Location: [facility];  Service: General;  Laterality: Right; . BREAST LUMPECTOMY WITH RADIOACTIVE SEED AND AXILLARY LYMPH NODE DISSECTION Left 04/10/2020  Procedure: LEFT BREAST LUMPECTOMY WITH RADIOACTIVE SEED AND TARGETED AXILLARY LYMPH NODE DISSECTION;  Surgeon: Donnie Mesa, MD;  Location: [place];  Service: General;  Laterality: Left;  LMA, PEC BLOCK . CESAREAN SECTION    x 1 . COLONOSCOPY   . DILATATION & CURRETTAGE/HYSTEROSCOPY WITH RESECTOCOPE N/A 06/05/2016  Procedure: DILATATION & CURETTAGE/HYSTEROSCOPY;  Surgeon: Eldred Manges, MD;  Location: [facility];  Service: Gynecology;  Laterality: N/A; . DILATION AND CURETTAGE OF UTERUS   . left achilles tendon repair   . PORTACATH PLACEMENT Right 11/16/2019  Procedure: INSERTION PORT-A-CATH WITH ULTRASOUND;  Surgeon: Donnie Mesa, MD;  Location: [place];  Service: General;  Laterality: Right; . right achilles tendon    and left . right ovarian cyst    hx . ROBOTIC ASSISTED TOTAL HYSTERECTOMY WITH BILATERAL SALPINGO OOPHERECTOMY Bilateral 06/16/2016  Procedure: XI ROBOTIC ASSISTED  TOTAL HYSTERECTOMY WITH BILATERAL SALPINGO OOPHORECTOMY AND SENTINAL LYMPH NODE BIOPSY, MINI LAPAROTOMY;  Surgeon: Everitt Amber, MD;  Location: [facility];  Service: Gynecology;  Laterality: Bilateral; . s/p ear surgury   . s/p extra uterine fibroid  2006 . s/p left knee replacement  2007 . TOTAL KNEE REVISION Left 07/22/2016  Procedure: TOTAL KNEE REVISION ARTHROPLASTY;  Surgeon: Gaynelle Arabian, MD;  Location: [facility];  Service: Orthopedics;  Laterality: Left; . UTERINE FIBROID SURGERY  2006  x 1 HPI: 64 yr old seen for outpatient MBS with history of metastatic breast cancer (currently in immunotherapy- had radiation), GERD (on meds), "fluid in lung". Pt reports globus sensation primarily noted on "left side of my throat" and difficulty swallowing pills.  No data recorded Assessment / Plan / Recommendation CHL IP CLINICAL IMPRESSIONS 07/08/2020 Clinical Impression Pt exhibited functional oral and pharyngeal phases of swallow. Range of motion, strength and timing were normal to protect airway and without aspiration or pharyngeal residue. Her symptoms of pharyngeal global sensation appear related to her GERD likely resulting in globus sensation. She orally transited the barium pill after 3 trials of thin liquid. MBS does not diagnose the esophagus however pill cleared into stomach in a timely manner (barium stasis viewed and possibly slow to transit on brief observation). Pt educated on results and recommendations to continue regular (using caution with meat/bread), thin liquids, straws allowed and pills with liquid ( educated to use puree if difficulty with liquids). Pt given verbal and written instructions [id number].         SLP Visit Diagnosis Dysphagia, unspecified (R13.10) Attention and concentration deficit following -- Frontal lobe and executive function deficit following -- Impact on safety and function Mild aspiration risk   CHL IP TREATMENT RECOMMENDATION 07/08/2020 Treatment Recommendations No treatment recommended  at this time   No flowsheet data found. CHL IP DIET RECOMMENDATION 07/08/2020 SLP Diet Recommendations Regular solids;Thin liquid Liquid Administration via Cup;Straw Medication Administration Whole meds with liquid Compensations Other (Comment) Postural Changes Seated upright at 90 degrees;Remain semi-upright after after feeds/meals (Comment)   CHL IP OTHER RECOMMENDATIONS 07/08/2020 Recommended Consults -- Oral Care Recommendations Oral care BID Other Recommendations --   CHL IP FOLLOW UP RECOMMENDATIONS 07/08/2020 Follow up Recommendations None   No flowsheet data found.     CHL IP ORAL PHASE 07/08/2020 Oral Phase WFL Oral - Pudding Teaspoon -- Oral - Pudding Cup -- Oral - Honey Teaspoon -- Oral - Honey Cup -- Oral - Nectar Teaspoon --  Oral - Nectar Cup -- Oral - Nectar Straw -- Oral - Thin Teaspoon -- Oral - Thin Cup -- Oral - Thin Straw -- Oral - Puree -- Oral - Mech Soft -- Oral - Regular -- Oral - Multi-Consistency -- Oral - Pill -- Oral Phase - Comment --  CHL IP PHARYNGEAL PHASE 07/08/2020 Pharyngeal Phase WFL Pharyngeal- Pudding Teaspoon -- Pharyngeal -- Pharyngeal- Pudding Cup -- Pharyngeal -- Pharyngeal- Honey Teaspoon -- Pharyngeal -- Pharyngeal- Honey Cup -- Pharyngeal -- Pharyngeal- Nectar Teaspoon -- Pharyngeal -- Pharyngeal- Nectar Cup -- Pharyngeal -- Pharyngeal- Nectar Straw -- Pharyngeal -- Pharyngeal- Thin Teaspoon -- Pharyngeal -- Pharyngeal- Thin Cup -- Pharyngeal -- Pharyngeal- Thin Straw -- Pharyngeal -- Pharyngeal- Puree -- Pharyngeal -- Pharyngeal- Mechanical Soft -- Pharyngeal -- Pharyngeal- Regular -- Pharyngeal -- Pharyngeal- Multi-consistency -- Pharyngeal -- Pharyngeal- Pill -- Pharyngeal -- Pharyngeal Comment --  CHL IP CERVICAL ESOPHAGEAL PHASE 07/08/2020 Cervical Esophageal Phase WFL Pudding Teaspoon -- Pudding Cup -- Honey Teaspoon -- Honey Cup -- Nectar Teaspoon -- Nectar Cup -- Nectar Straw -- Thin Teaspoon -- Thin Cup -- Thin Straw -- Puree -- Mechanical Soft -- Regular --  Multi-consistency -- Pill -- Cervical Esophageal Comment -- Houston Siren 07/08/2020, 1:50 PM              ECHOCARDIOGRAM COMPLETE  Result Date: 06/28/2020    ECHOCARDIOGRAM REPORT   Patient Name:   SHEELA MCCULLEY Date of Exam: 06/28/2020 Medical Rec #:  [id number]            Height:       59.0 in Accession #:    [id number]           Weight:       206.8 lb Date of Birth:  1955-11-13            BSA:          1.871 m Patient Age:    46 years             BP:           114/71 mmHg Patient Gender: F                    HR:           70 bpm. Exam Location:  Outpatient Procedure: 2D Echo, Color Doppler and Cardiac Doppler Indications:     CHF  History:         Patient has prior history of Echocardiogram examinations.  Sonographer:     NA Referring Phys:  [place] Phys: Loralie Champagne MD IMPRESSIONS  1. Left ventricular ejection fraction, by estimation, is 55 to 60%. The left ventricle has normal function. The left ventricle has no regional wall motion abnormalities. Left ventricular diastolic parameters are consistent with Grade II diastolic dysfunction (pseudonormalization). GLS -11.6% but strain imaging does not track well.  2. Right ventricular systolic function is normal. The right ventricular size is normal. Tricuspid regurgitation signal is inadequate for assessing PA pressure.  3. Left atrial size was mildly dilated.  4. The aortic valve was not well visualized. Aortic valve regurgitation is not visualized. No aortic stenosis is present.  5. The mitral valve is normal in structure. No evidence of mitral valve regurgitation. No evidence of mitral stenosis.  6. The inferior vena cava is normal in size with greater than 50% respiratory variability, suggesting right atrial pressure of 3 mmHg.\  7. Technically difficult study. FINDINGS  Left Ventricle:  Left ventricular ejection fraction, by estimation, is 55 to 60%. The left ventricle has normal function. The left ventricle has no  regional wall motion abnormalities. The left ventricular internal cavity size was normal in size. There is  no left ventricular hypertrophy. Left ventricular diastolic parameters are consistent with Grade II diastolic dysfunction (pseudonormalization). Right Ventricle: The right ventricular size is normal. No increase in right ventricular wall thickness. Right ventricular systolic function is normal. Tricuspid regurgitation signal is inadequate for assessing PA pressure. Left Atrium: Left atrial size was mildly dilated. Right Atrium: Right atrial size was normal in size. Pericardium: Trivial pericardial effusion is present. Mitral Valve: The mitral valve is normal in structure. No evidence of mitral valve regurgitation. No evidence of mitral valve stenosis. Tricuspid Valve: The tricuspid valve is normal in structure. Tricuspid valve regurgitation is not demonstrated. Aortic Valve: The aortic valve was not well visualized. Aortic valve regurgitation is not visualized. No aortic stenosis is present. Pulmonic Valve: The pulmonic valve was normal in structure. Pulmonic valve regurgitation is not visualized. Aorta: The aortic root is normal in size and structure. Venous: The inferior vena cava is normal in size with greater than 50% respiratory variability, suggesting right atrial pressure of 3 mmHg. IAS/Shunts: No atrial level shunt detected by color flow Doppler.  LEFT VENTRICLE PLAX 2D LVIDd:         5.10 cm  Diastology LVIDs:         3.20 cm  LV e' medial:    6.31 cm/s LV PW:         1.30 cm  LV E/e' medial:  11.9 LV IVS:        0.90 cm  LV e' lateral:   6.64 cm/s LVOT diam:     2.00 cm  LV E/e' lateral: 11.3 LV SV:         63 LV SV Index:   33 LVOT Area:     3.14 cm  LEFT ATRIUM           Index LA diam:      3.70 cm 1.98 cm/m LA Vol (A2C): 26.7 ml 14.27 ml/m  AORTIC VALVE LVOT Vmax:   110.00 cm/s LVOT Vmean:  79.000 cm/s LVOT VTI:    0.199 m  AORTA Ao Root diam: 2.60 cm MITRAL VALVE MV Area (PHT): 3.27 cm     SHUNTS MV Decel Time: 232 msec    Systemic VTI:  0.20 m MV E velocity: 75.00 cm/s  Systemic Diam: 2.00 cm MV A velocity: 66.40 cm/s MV E/A ratio:  1.13 Loralie Champagne MD Electronically signed by Loralie Champagne MD Signature Date/Time: 06/28/2020/3:10:20 PM    Final (Updated)    IR RADIOLOGY PERIPHERAL GUIDED IV START  Result Date: 07/09/2020 CLINICAL DATA:  Poor venous access EXAM: ULTRASOUND GUIDED VENIPUNCTURE BY MD CONTRAST:  None COMPLICATIONS: None immediate TECHNIQUE: The right upper arm was prepped with chlorhexidine in a sterile fashion, and a sterile drape was applied covering the operative field. Local anesthesia was provided with 1% lidocaine. Under direct ultrasound guidance, the right basilic vein was accessed with a micropuncture kit after the overlying soft tissues were anesthetized with 1% lidocaine. An ultrasound image was saved for documentation purposes. The micropuncture sheath easily aspirated and flushed and was secured in place. A dressing was placed. The patient tolerated the procedure well without immediate post procedural complication. IMPRESSION: Successful ultrasound guided placement of a right basilic vein approach micropuncture sheath for temporary venous access. Electronically Signed   By: Camillia Herter  Suttle MD   On: 07/09/2020 15:07     ASSESSMENT: 64 y.o.  [place] woman with  A: INVASIVE DUCTAL CARCINOMA LEFT BREAST (1)  status post left lumpectomy and sentinel lymph node dissection April of 2012 for a T1b N1(mic) stage IB invasive ductal carcinoma, grade 1, estrogen receptor 82% and progesterone receptor 92% positive, with no HER-2 amplification, and an MIB-1-1 of 17%,   (2)  The patient's Oncotype DX score of 21 predicted a 13% risk of distant recurrence after 5 years of tamoxifen.  (3)  status post radiation completed August of 2012,   (4)  on tamoxifen from September of 2012 to April 2014  (5) the plan had been to initiate anastrozole in April 2014, but the patient  had a menstrual cycle in May 2014, and resumed tamoxifen.  (a) discontinued tamoxifen on her own initiative June 2015 because of "aches and pains".  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, grade 3, estrogen and progesterone receptor negative  (9) definitive surgery delayed (see discussion in 11/03/2019 note)  (10) genetics testing 06/14/2019 through the Multi-Gene Panel offered by [facility] found no deleterious mutations in AIP, ALK, APC, ATM, AXIN2,BAP1,  BARD1, BLM, BMPR1A, BRCA1, BRCA2, BRIP1, CASR, CDC73, CDH1, CDK4, CDKN1B, CDKN1C, CDKN2A (p14ARF), CDKN2A (p16INK4a), CEBPA, CHEK2, CTNNA1, DICER1, DIS3L2, EGFR (c.2369C>T, p.Thr790Met variant only), EPCAM (Deletion/duplication testing only), FH, FLCN, GATA2, GPC3, GREM1 (Promoter region deletion/duplication testing only), HOXB13 (c.251G>A, p.Gly84Glu), HRAS, KIT, MAX, MEN1, MET, MITF (c.952G>A, p.Glu318Lys variant only), MLH1, MSH2, MSH3, MSH6, MUTYH, NBN, NF1, NF2, NTHL1, PALB2, PDGFRA, PHOX2B, PMS2, POLD1, POLE, POT1, PRKAR1A, PTCH1, PTEN, RAD50, RAD51C, RAD51D, RB1, RECQL4, RET, RNF43, RUNX1, SDHAF2, SDHA (sequence changes only), SDHB, SDHC, SDHD, SMAD4, SMARCA4, SMARCB1, SMARCE1, STK11, SUFU, TERC, TERT, TMEM127, TP53, TSC1, TSC2, VHL, WRN and WT1.    C: METASTATIC BREAST CANCER: JAN 2021 (11) left axillary lymph node biopsy 10/30/2019 documents invasive mammary carcinoma, grade 3, estrogen receptor positive (80%, weak), progesterone receptor negative, HER-2 amplified (3+) MIB-70%  (a) breast MRI 11/23/2019 shows 3.6 cm non-mass-like enhancement in  the left breast, with a second more clumped area measuring 4.8 cm, and at least 5 morphologically abnormal left axillary lymph node.  There is a left subpectoral lymph node and a left internal mammary lymph node noted as well.  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  (b) pertuzumab held with cycle 2 because of persistent diarrhea  (c) anti-HER2 therapy held after cycle 4 because of a drop in EF  (d) carbo/gemzar stopped after cycle 5, last dose 02/13/2020  (13) left breast lumpectomy and axillary node dissection on 04/10/2020 showed a residual ypT0 ypN1    (a) 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  (b) a total of 2 left axillary lymph nodes removed, one positive  (14) anti-HER-2 treatment resumed  after surgery to be continued indefinitely  (a) echo 11/09/2019 shows an ejection fraction in the 60-65% range  (b) echo 02/09/2020 shows an ejection fraction in the 45 to 50% range  (c) echo 03/26/2020 shows an ejection fraction in the 55-60% range  (d) trastuzumab resumed 03/28/2020  (e) echo 05/06/2020 shows an ejection fraction in the 55-60% range  (f) trastuzumab discontinued after 06/20/2020 dose with progression  (14) switched to TDM-1/Kadcyla starting 07/11/2020  (a) new baseline PET scan 07/01/2020 shows significant supraclavicular, mediastinal and prevascular adenopathy with new small left pleural and pericardial effusions  (b) echo 06/28/2020 shows and ejection fraction in the 55-60% range  (14) adjuvant radiation being considered depending on overall  response  (16) leftlower extremity DVT documented by Doppler ultrasound 05/31/2020  (a) rivaroxaban/Xarelto started 05/31/2020   PLAN: Lavaughn's situation is complex.  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  Treatment had to be interrupted because a temporary drop in the ejection fraction.  That has resolved and of course it is difficult to tell whether the progression is due to the interruption in treatment or the fact that treatment was not effective.  Either way it is reasonable to proceed to T-DM1 at this point  However the repeat prognostic panel from her lymph node biopsy in June was triple negative.  I explained to Dorothey that if most of her cancer is indeed triple negative at this point we would have to go to chemotherapy.  What I would like to do now is try T-DM1 for 2 cycles and repeat a PET scan.  If we do not document a response or if there is any cardiac problem we will switch to chemotherapy, which could be capecitabine, CMF, or any number of other agents.  It does not make sense to proceed to extensive radiation treatment with uncontrolled disease.  Radiation will be used for consolidation once we obtain the best response.  Incidentally she does have a palpable right cervical lymph node which may serve as an early indicator of response or progression  I have scheduled her repeat PET scan for October 28 and she will see me the following week.  Depending on those results we will either switch to chemotherapy or continue T-DM1 as currently planned.  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  We will proceed to transfusion later this week  Total encounter time 40 minutes.Sarajane Jews C. Stephano Arrants, MD 07/11/20 10:23 AM Medical Oncology and Hematology [facility] [place], [place] [address] Tel. [phone]    Fax. [phone]   I, Wilburn Mylar, am acting  as scribe for Dr. [name]. Taylorann Tkach.  I, Lurline Del MD, have reviewed the above documentation for accuracy and completeness, and I agree with the above.   *Total Encounter Time as defined by the Centers for Medicare and Medicaid Services includes, in addition to the face-to-face time of a patient visit (documented in the note above) non-face-to-face time: obtaining and reviewing outside history, ordering and reviewing medications, tests or procedures, care coordination (communications with other health care professionals or caregivers) and documentation in the medical record.

## 2020-07-10 NOTE — Telephone Encounter (Signed)
Thanks for the update. I addressed this at her clinic visit with me as outlined:  Abdominal pain - this has been ongoing for at least 5 years.  CT scan and PET scan shows no evidence of malignancy in that area.  There is no prandial relationship at all, she is quite tender to the area at baseline and she has positional pain.  Based on exam and history this may more likely be musculoskeletal in etiology, we discussed what this is.  Its intermittent and reliably improved with lying down, and that light she does not really want to take any medications for this.  We discussed the yield of pursuing an endoscopy or an ultrasound to evaluate for gallstones or other causes, again without a prandial relationship I think yield of this may be low.  Will await conversation with Dr. Jana Hakim as above, if we pursue EGD for her anemia, can rule out upper tract causes to contribute to her pain.  In the interim she can take her omeprazole 40 mg every day and see if that helps her all, however her reflux symptoms appear pretty well controlled at baseline.     I spoke with Dr. Jana Hakim, we decided to hold off on EGD for her anemia, they are considering bone marrow biopsy. I don't think we are missing anything too concerning given PET and CT scan results. I would recommend she take omeprazole daily for now and see how she does. She can try taking it BID for a few days and see if she notes any difference. We can consider an EGD pending her course.

## 2020-07-10 NOTE — Telephone Encounter (Signed)
Spoke with patient regarding modified barium study results, see result note for information. She states that her main concern is her abdominal pain in the center of her stomach, pt states that there is a knot there all of the time. Denies any heavy lifting. Pt states that she discussed this with Dr. Havery Moros at last [place]. Pt states that Omeprazole does help when she takes it. Please advise on any further recommendations, thank you

## 2020-07-11 ENCOUNTER — Other Ambulatory Visit: Payer: Self-pay | Admitting: *Deleted

## 2020-07-11 ENCOUNTER — Inpatient Hospital Stay: Payer: 59

## 2020-07-11 ENCOUNTER — Telehealth: Payer: Self-pay | Admitting: *Deleted

## 2020-07-11 ENCOUNTER — Inpatient Hospital Stay (HOSPITAL_BASED_OUTPATIENT_CLINIC_OR_DEPARTMENT_OTHER): Payer: 59 | Admitting: Oncology

## 2020-07-11 ENCOUNTER — Other Ambulatory Visit: Payer: Self-pay

## 2020-07-11 ENCOUNTER — Telehealth: Payer: Self-pay

## 2020-07-11 VITALS — BP 121/77 | HR 84 | Temp 98.4°F | Resp 18 | Ht 59.0 in | Wt 211.4 lb

## 2020-07-11 DIAGNOSIS — C50112 Malignant neoplasm of central portion of left female breast: Secondary | ICD-10-CM | POA: Diagnosis not present

## 2020-07-11 DIAGNOSIS — Z171 Estrogen receptor negative status [ER-]: Secondary | ICD-10-CM

## 2020-07-11 DIAGNOSIS — C50812 Malignant neoplasm of overlapping sites of left female breast: Secondary | ICD-10-CM | POA: Diagnosis not present

## 2020-07-11 DIAGNOSIS — C541 Malignant neoplasm of endometrium: Secondary | ICD-10-CM

## 2020-07-11 DIAGNOSIS — C771 Secondary and unspecified malignant neoplasm of intrathoracic lymph nodes: Secondary | ICD-10-CM

## 2020-07-11 DIAGNOSIS — D509 Iron deficiency anemia, unspecified: Secondary | ICD-10-CM

## 2020-07-11 DIAGNOSIS — Z7189 Other specified counseling: Secondary | ICD-10-CM

## 2020-07-11 DIAGNOSIS — Z5112 Encounter for antineoplastic immunotherapy: Secondary | ICD-10-CM | POA: Diagnosis not present

## 2020-07-11 DIAGNOSIS — Z95828 Presence of other vascular implants and grafts: Secondary | ICD-10-CM

## 2020-07-11 DIAGNOSIS — D649 Anemia, unspecified: Secondary | ICD-10-CM

## 2020-07-11 DIAGNOSIS — D0512 Intraductal carcinoma in situ of left breast: Secondary | ICD-10-CM

## 2020-07-11 DIAGNOSIS — K760 Fatty (change of) liver, not elsewhere classified: Secondary | ICD-10-CM

## 2020-07-11 LAB — CBC WITH DIFFERENTIAL (CANCER CENTER ONLY)
Abs Immature Granulocytes: 0.03 10*3/uL (ref 0.00–0.07)
Basophils Absolute: 0 10*3/uL (ref 0.0–0.1)
Basophils Relative: 0 %
Eosinophils Absolute: 0.1 10*3/uL (ref 0.0–0.5)
Eosinophils Relative: 1 %
HCT: 24.9 % — ABNORMAL LOW (ref 36.0–46.0)
Hemoglobin: 7.8 g/dL — ABNORMAL LOW (ref 12.0–15.0)
Immature Granulocytes: 0 %
Lymphocytes Relative: 18 %
Lymphs Abs: 1.4 10*3/uL (ref 0.7–4.0)
MCH: 28.2 pg (ref 26.0–34.0)
MCHC: 31.3 g/dL (ref 30.0–36.0)
MCV: 89.9 fL (ref 80.0–100.0)
Monocytes Absolute: 1 10*3/uL (ref 0.1–1.0)
Monocytes Relative: 13 %
Neutro Abs: 5.2 10*3/uL (ref 1.7–7.7)
Neutrophils Relative %: 68 %
Platelet Count: 278 10*3/uL (ref 150–400)
RBC: 2.77 MIL/uL — ABNORMAL LOW (ref 3.87–5.11)
RDW: 16.4 % — ABNORMAL HIGH (ref 11.5–15.5)
WBC Count: 7.7 10*3/uL (ref 4.0–10.5)
nRBC: 0 % (ref 0.0–0.2)

## 2020-07-11 LAB — CMP (CANCER CENTER ONLY)
ALT: 12 U/L (ref 0–44)
AST: 16 U/L (ref 15–41)
Albumin: 2.4 g/dL — ABNORMAL LOW (ref 3.5–5.0)
Alkaline Phosphatase: 50 U/L (ref 38–126)
Anion gap: 7 (ref 5–15)
BUN: 16 mg/dL (ref 8–23)
CO2: 27 mmol/L (ref 22–32)
Calcium: 9.3 mg/dL (ref 8.9–10.3)
Chloride: 100 mmol/L (ref 98–111)
Creatinine: 0.98 mg/dL (ref 0.44–1.00)
GFR, Est AFR Am: >60 mL/min (ref >60)
GFR, Estimated: >60 mL/min (ref >60)
Glucose, Bld: 143 mg/dL — ABNORMAL HIGH (ref 70–99)
Potassium: 3.6 mmol/L (ref 3.5–5.1)
Sodium: 134 mmol/L — ABNORMAL LOW (ref 135–145)
Total Bilirubin: 0.4 mg/dL (ref 0.3–1.2)
Total Protein: 10.1 g/dL — ABNORMAL HIGH (ref 6.5–8.1)

## 2020-07-11 LAB — IRON AND TIBC
Iron: 19 ug/dL — ABNORMAL LOW (ref 41–142)
Saturation Ratios: 10 % — ABNORMAL LOW (ref 21–57)
TIBC: 188 ug/dL — ABNORMAL LOW (ref 236–444)
UIBC: 169 ug/dL (ref 120–384)

## 2020-07-11 LAB — FERRITIN: Ferritin: 772 ng/mL — ABNORMAL HIGH (ref 11–307)

## 2020-07-11 MED ORDER — SODIUM CHLORIDE 0.9 % IV SOLN
Freq: Once | INTRAVENOUS | Status: AC
  Filled 2020-07-11: qty 250

## 2020-07-11 MED ORDER — SODIUM CHLORIDE 0.9% FLUSH
10.0000 mL | Freq: Once | INTRAVENOUS | Status: AC
  Administered 2020-07-11: 10 mL
  Filled 2020-07-11: qty 10

## 2020-07-11 MED ORDER — SODIUM CHLORIDE 0.9 % IV SOLN: 3.6000 mg/kg | Freq: Once | INTRAVENOUS | Status: DC

## 2020-07-11 MED ORDER — ACETAMINOPHEN 325 MG PO TABS
ORAL_TABLET | ORAL | Status: AC
  Filled 2020-07-11: qty 2

## 2020-07-11 MED ORDER — ACETAMINOPHEN 325 MG PO TABS: 650.0000 mg | ORAL_TABLET | Freq: Once | ORAL | Status: DC

## 2020-07-11 MED ORDER — HEPARIN SOD (PORK) LOCK FLUSH 100 UNIT/ML IV SOLN
500.0000 [IU] | Freq: Once | INTRAVENOUS | Status: AC | PRN
  Administered 2020-07-11: 500 [IU]
  Filled 2020-07-11: qty 5

## 2020-07-11 MED ORDER — DIPHENHYDRAMINE HCL 25 MG PO CAPS
ORAL_CAPSULE | ORAL | Status: AC
  Filled 2020-07-11: qty 1

## 2020-07-11 MED ORDER — DIPHENHYDRAMINE HCL 25 MG PO CAPS: 25.0000 mg | ORAL_CAPSULE | Freq: Once | ORAL | Status: DC

## 2020-07-11 MED ORDER — SODIUM CHLORIDE 0.9% FLUSH
10.0000 mL | INTRAVENOUS | Status: DC | PRN
  Administered 2020-07-11: 10 mL
  Filled 2020-07-11: qty 10

## 2020-07-11 NOTE — Telephone Encounter (Signed)
Lm on vm for patient to return call 

## 2020-07-11 NOTE — Telephone Encounter (Signed)
Per MD ok to proceed with treatment today with heme of 7.8 - pt to be transfused9/25/2021

## 2020-07-11 NOTE — Progress Notes (Signed)
Pt. did not receive treatment today as prior authorization was not obtained. Pharmacy is working on it. We will call pt. to reschedule appointment. Pt. understands to come 07/13/20 for blood transfusion.

## 2020-07-11 NOTE — Telephone Encounter (Signed)
This RN spoke with [name] per her VM stating concern over issue of not getting treated today and concern to needing therapy.  This RN apologized for occurrence this AM- as well as informed her authorization has now been obtained and she is scheduled for treatment tomorrow at 1230 am.  Note [name] was on the phone with [facility] and actually merged this RN into call with representative who verified authorization has been received,  Per end of call- pt stating appreciation for quick follow up and verified she will be here at noon tomorrow.

## 2020-07-11 NOTE — Addendum Note (Signed)
Addended by: Chauncey Cruel on: 07/11/2020 10:52 AM   Modules accepted: Orders

## 2020-07-11 NOTE — Telephone Encounter (Signed)
Called Mrs. [name] to apologize for the prior authorization issues we had today with her treatment.  I explained that we would never want someone to have to pay out of pocket for expensive medications.  She has approval now and we would like for her to come at 730 or 1200 tomorrow if she is available.  Left both my desk and cell number to call back and get her scheduled. Gardiner Rhyme, RN

## 2020-07-12 ENCOUNTER — Other Ambulatory Visit: Payer: Self-pay

## 2020-07-12 ENCOUNTER — Telehealth: Payer: Self-pay | Admitting: Oncology

## 2020-07-12 ENCOUNTER — Inpatient Hospital Stay: Payer: 59

## 2020-07-12 VITALS — BP 127/97 | HR 84 | Temp 98.6°F | Resp 16

## 2020-07-12 DIAGNOSIS — Z171 Estrogen receptor negative status [ER-]: Secondary | ICD-10-CM

## 2020-07-12 DIAGNOSIS — Z5112 Encounter for antineoplastic immunotherapy: Secondary | ICD-10-CM | POA: Diagnosis not present

## 2020-07-12 DIAGNOSIS — C50812 Malignant neoplasm of overlapping sites of left female breast: Secondary | ICD-10-CM

## 2020-07-12 DIAGNOSIS — Z95828 Presence of other vascular implants and grafts: Secondary | ICD-10-CM

## 2020-07-12 DIAGNOSIS — C50112 Malignant neoplasm of central portion of left female breast: Secondary | ICD-10-CM

## 2020-07-12 MED ORDER — DIPHENHYDRAMINE HCL 25 MG PO CAPS
ORAL_CAPSULE | ORAL | Status: AC
  Filled 2020-07-12: qty 1

## 2020-07-12 MED ORDER — ACETAMINOPHEN 325 MG PO TABS
ORAL_TABLET | ORAL | Status: AC
  Filled 2020-07-12: qty 2

## 2020-07-12 MED ORDER — SODIUM CHLORIDE 0.9 % IV SOLN
3.6000 mg/kg | Freq: Once | INTRAVENOUS | Status: AC
  Administered 2020-07-12: 360 mg via INTRAVENOUS
  Filled 2020-07-12: qty 8

## 2020-07-12 MED ORDER — EPOETIN ALFA-EPBX 40000 UNIT/ML IJ SOLN
40000.0000 [IU] | Freq: Once | INTRAMUSCULAR | Status: AC
  Administered 2020-07-12: 40000 [IU] via SUBCUTANEOUS

## 2020-07-12 MED ORDER — SODIUM CHLORIDE 0.9% FLUSH
10.0000 mL | INTRAVENOUS | Status: DC | PRN
  Administered 2020-07-12: 10 mL
  Filled 2020-07-12: qty 10

## 2020-07-12 MED ORDER — DIPHENHYDRAMINE HCL 25 MG PO CAPS
25.0000 mg | ORAL_CAPSULE | Freq: Once | ORAL | Status: AC
  Administered 2020-07-12: 25 mg via ORAL

## 2020-07-12 MED ORDER — ACETAMINOPHEN 325 MG PO TABS
650.0000 mg | ORAL_TABLET | Freq: Once | ORAL | Status: AC
  Administered 2020-07-12: 650 mg via ORAL

## 2020-07-12 MED ORDER — HEPARIN SOD (PORK) LOCK FLUSH 100 UNIT/ML IV SOLN
500.0000 [IU] | Freq: Once | INTRAVENOUS | Status: AC | PRN
  Administered 2020-07-12: 500 [IU]
  Filled 2020-07-12: qty 5

## 2020-07-12 MED ORDER — SODIUM CHLORIDE 0.9 % IV SOLN
Freq: Once | INTRAVENOUS | Status: AC
  Filled 2020-07-12: qty 250

## 2020-07-12 MED ORDER — EPOETIN ALFA-EPBX 40000 UNIT/ML IJ SOLN
INTRAMUSCULAR | Status: AC
  Filled 2020-07-12: qty 1

## 2020-07-12 NOTE — Telephone Encounter (Signed)
Spoke with patient regarding recommendations as outlined below by Dr. Havery Moros. Pt is aware that Dr. Havery Moros has consulted with Dr. Jana Hakim on next steps for her, pt states that she has spoken with Dr. 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  Advised patient to continue to take Omeprazole 40 mg/day, advised that she could also increase Omeprazole 40 mg to twice a day for a few days to see if she notices any difference. Pt has no other concerns at this time.

## 2020-07-12 NOTE — Patient Instructions (Addendum)
[facility] Discharge Instructions for Patients Receiving Chemotherapy  Today you received the following chemotherapy agents: Ado-trastuzumab emtansine (Kadcyla)  To help prevent nausea and vomiting after your treatment, we encourage you to take your nausea medication  as prescribed.    If you develop nausea and vomiting that is not controlled by your nausea medication, call the clinic.   BELOW ARE SYMPTOMS THAT SHOULD BE REPORTED IMMEDIATELY:  *FEVER GREATER THAN 100.5 F  *CHILLS WITH OR WITHOUT FEVER  NAUSEA AND VOMITING THAT IS NOT CONTROLLED WITH YOUR NAUSEA MEDICATION  *UNUSUAL SHORTNESS OF BREATH  *UNUSUAL BRUISING OR BLEEDING  TENDERNESS IN MOUTH AND THROAT WITH OR WITHOUT PRESENCE OF ULCERS  *URINARY PROBLEMS  *BOWEL PROBLEMS  UNUSUAL RASH Items with * indicate a potential emergency and should be followed up as soon as possible.  Feel free to call the clinic should you have any questions or concerns. The clinic phone number is [phone].  Please show the [place] at check-in to the Emergency Department and triage nurse.  Ado-Trastuzumab Emtansine for injection What is this medicine? ADO-TRASTUZUMAB EMTANSINE (ADD oh traz TOO zuh mab em TAN zine) is a monoclonal antibody combined with chemotherapy. It is used to treat breast cancer. This medicine may be used for other purposes; ask your health care provider or pharmacist if you have questions. COMMON BRAND NAME(S): Kadcyla What should I tell my health care provider before I take this medicine? They need to know if you have any of these conditions:  heart disease  heart failure  infection (especially a virus infection such as chickenpox, cold sores, or herpes)  liver disease  lung or breathing disease, like asthma  tingling of the fingers or toes, or other nerve disorder  an unusual or allergic reaction to ado-trastuzumab emtansine, other medications, foods, dyes, or  preservatives  pregnant or trying to get pregnant  breast-feeding How should I use this medicine? This medicine is for infusion into a vein. It is given by a health care professional in a hospital or clinic setting. Talk to your pediatrician regarding the use of this medicine in children. Special care may be needed. Overdosage: If you think you have taken too much of this medicine contact a poison control center or emergency room at once. NOTE: This medicine is only for you. Do not share this medicine with others. What if I miss a dose? It is important not to miss your dose. Call your doctor or health care professional if you are unable to keep an appointment. What may interact with this medicine? This medicine may also interact with the following medications:  atazanavir  boceprevir  clarithromycin  delavirdine  indinavir  dalfopristin; quinupristin  isoniazid, INH  itraconazole  ketoconazole  nefazodone  nelfinavir  ritonavir  telaprevir  telithromycin  tipranavir  voriconazole This list may not describe all possible interactions. Give your health care provider a list of all the medicines, herbs, non-prescription drugs, or dietary supplements you use. Also tell them if you smoke, drink alcohol, or use illegal drugs. Some items may interact with your medicine. What should I watch for while using this medicine? Visit your doctor for checks on your progress. This drug may make you feel generally unwell. This is not uncommon, as chemotherapy can affect healthy cells as well as cancer cells. Report any side effects. Continue your course of treatment even though you feel ill unless your doctor tells you to stop. You may need blood work done while you  are taking this medicine. Call your doctor or health care professional for advice if you get a fever, chills or sore throat, or other symptoms of a cold or flu. Do not treat yourself. This drug decreases your body's ability  to fight infections. Try to avoid being around people who are sick. Be careful brushing and flossing your teeth or using a toothpick because you may get an infection or bleed more easily. If you have any dental work done, tell your dentist you are receiving this medicine. Avoid taking products that contain aspirin, acetaminophen, ibuprofen, naproxen, or ketoprofen unless instructed by your doctor. These medicines may hide a fever. Do not become pregnant while taking this medicine or for 7 months after stopping it, men with female partners should use contraception during treatment and for 4 months after the last dose. Women should inform their doctor if they wish to become pregnant or think they might be pregnant. There is a potential for serious side effects to an unborn child. Do not breast-feed an infant while taking this medicine or for 7 months after the last dose. Men who have a partner who is pregnant or who is capable of becoming pregnant should use a condom during sexual activity while taking this medicine and for 4 months after stopping it. Men should inform their doctors if they wish to father a child. This medicine may lower sperm counts. Talk to your health care professional or pharmacist for more information. What side effects may I notice from receiving this medicine? Side effects that you should report to your doctor or health care professional as soon as possible:  allergic reactions like skin rash, itching or hives, swelling of the face, lips, or tongue  breathing problems  chest pain or palpitations  fever or chills, sore throat  general ill feeling or flu-like symptoms  light-colored stools  nausea, vomiting  pain, tingling, numbness in the hands or feet  signs and symptoms of bleeding such as bloody or black, tarry stools; red or dark-brown urine; spitting up blood or brown material that looks like coffee grounds; red spots on the skin; unusual bruising or bleeding from  the eye, gums, or nose  swelling of the legs or ankles  yellowing of the eyes or skin Side effects that usually do not require medical attention (report to your doctor or health care professional if they continue or are bothersome):  changes in taste  constipation  dizziness  headache  joint pain  muscle pain  trouble sleeping  unusually weak or tired This list may not describe all possible side effects. Call your doctor for medical advice about side effects. You may report side effects to FDA at 1-800-FDA-1088. Where should I keep my medicine? This drug is given in a hospital or clinic and will not be stored at home. NOTE: This sheet is a summary. It may not cover all possible information. If you have questions about this medicine, talk to your doctor, pharmacist, or health care provider.  2020 Elsevier/Gold Standard (2018-03-04 10:03:15)

## 2020-07-12 NOTE — Telephone Encounter (Signed)
Scheduled per 9/23 los. Called and spoke with pt, confirmed added appts and pt was given a printout

## 2020-07-13 ENCOUNTER — Other Ambulatory Visit: Payer: Self-pay

## 2020-07-13 ENCOUNTER — Inpatient Hospital Stay: Payer: 59

## 2020-07-13 DIAGNOSIS — Z5112 Encounter for antineoplastic immunotherapy: Secondary | ICD-10-CM | POA: Diagnosis not present

## 2020-07-13 DIAGNOSIS — D649 Anemia, unspecified: Secondary | ICD-10-CM

## 2020-07-13 LAB — PREPARE RBC (CROSSMATCH)

## 2020-07-13 MED ORDER — DIPHENHYDRAMINE HCL 25 MG PO CAPS
25.0000 mg | ORAL_CAPSULE | Freq: Once | ORAL | Status: AC
  Administered 2020-07-13: 25 mg via ORAL

## 2020-07-13 MED ORDER — SODIUM CHLORIDE 0.9% IV SOLUTION
250.0000 mL | Freq: Once | INTRAVENOUS | Status: AC
  Administered 2020-07-13: 250 mL via INTRAVENOUS
  Filled 2020-07-13: qty 250

## 2020-07-13 MED ORDER — ACETAMINOPHEN 325 MG PO TABS
650.0000 mg | ORAL_TABLET | Freq: Once | ORAL | Status: AC
  Administered 2020-07-13: 650 mg via ORAL

## 2020-07-13 MED ORDER — HEPARIN SOD (PORK) LOCK FLUSH 100 UNIT/ML IV SOLN
500.0000 [IU] | Freq: Every day | INTRAVENOUS | Status: AC | PRN
  Administered 2020-07-13: 500 [IU]
  Filled 2020-07-13: qty 5

## 2020-07-13 MED ORDER — SODIUM CHLORIDE 0.9% FLUSH
10.0000 mL | INTRAVENOUS | Status: AC | PRN
  Administered 2020-07-13: 10 mL
  Filled 2020-07-13: qty 10

## 2020-07-13 MED ORDER — DIPHENHYDRAMINE HCL 25 MG PO CAPS
ORAL_CAPSULE | ORAL | Status: AC
  Filled 2020-07-13: qty 1

## 2020-07-13 MED ORDER — ACETAMINOPHEN 325 MG PO TABS
ORAL_TABLET | ORAL | Status: AC
  Filled 2020-07-13: qty 2

## 2020-07-13 NOTE — Patient Instructions (Signed)

## 2020-07-14 LAB — BPAM RBC
Blood Product Expiration Date: 202110062359
Blood Product Expiration Date: 202110062359
ISSUE DATE / TIME: 202109250955
ISSUE DATE / TIME: 202109250955
Unit Type and Rh: 6200
Unit Type and Rh: 6200

## 2020-07-14 LAB — TYPE AND SCREEN
ABO/RH(D): A POS
Antibody Screen: NEGATIVE
Unit division: 0
Unit division: 0

## 2020-07-15 ENCOUNTER — Telehealth: Payer: Self-pay | Admitting: *Deleted

## 2020-07-15 NOTE — Telephone Encounter (Addendum)
-----   Message from [name], RN sent at 07/12/2020  2:47 PM EDT ----- Regarding: [name] - Dr. Jana Hakim Patient [name] - Dr. Jana Hakim Patient  Called pt to discuss how she did with her recent new treatment.  She reports that she is tired & stayed in bed yest but no specific c/o's.  She is going to try to get up & moving 9/27.  She knows how to reach [place] & states she will call with any problems.

## 2020-07-25 ENCOUNTER — Ambulatory Visit: Payer: 59 | Admitting: Neurology

## 2020-07-31 ENCOUNTER — Other Ambulatory Visit: Payer: Self-pay | Admitting: Oncology

## 2020-07-31 ENCOUNTER — Other Ambulatory Visit: Payer: Self-pay

## 2020-07-31 DIAGNOSIS — D509 Iron deficiency anemia, unspecified: Secondary | ICD-10-CM
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
## 2020-08-01 ENCOUNTER — Encounter: Payer: Self-pay | Admitting: Nurse Practitioner

## 2020-08-01 ENCOUNTER — Inpatient Hospital Stay: Payer: 59

## 2020-08-01 ENCOUNTER — Telehealth: Payer: Self-pay

## 2020-08-01 ENCOUNTER — Inpatient Hospital Stay: Payer: 59 | Attending: Oncology

## 2020-08-01 ENCOUNTER — Other Ambulatory Visit: Payer: Self-pay | Admitting: Oncology

## 2020-08-01 ENCOUNTER — Other Ambulatory Visit: Payer: Self-pay

## 2020-08-01 ENCOUNTER — Inpatient Hospital Stay (HOSPITAL_BASED_OUTPATIENT_CLINIC_OR_DEPARTMENT_OTHER): Payer: 59 | Admitting: Nurse Practitioner

## 2020-08-01 VITALS — BP 152/80 | HR 73 | Temp 97.0°F | Resp 18 | Ht 59.0 in | Wt 212.5 lb

## 2020-08-01 DIAGNOSIS — Z923 Personal history of irradiation: Secondary | ICD-10-CM | POA: Diagnosis not present

## 2020-08-01 DIAGNOSIS — Z171 Estrogen receptor negative status [ER-]: Secondary | ICD-10-CM | POA: Diagnosis not present

## 2020-08-01 DIAGNOSIS — Z8542 Personal history of malignant neoplasm of other parts of uterus: Secondary | ICD-10-CM | POA: Insufficient documentation

## 2020-08-01 DIAGNOSIS — T451X5A Adverse effect of antineoplastic and immunosuppressive drugs, initial encounter: Secondary | ICD-10-CM | POA: Insufficient documentation

## 2020-08-01 DIAGNOSIS — Z7901 Long term (current) use of anticoagulants: Secondary | ICD-10-CM | POA: Diagnosis not present

## 2020-08-01 DIAGNOSIS — Z9079 Acquired absence of other genital organ(s): Secondary | ICD-10-CM | POA: Insufficient documentation

## 2020-08-01 DIAGNOSIS — Z79899 Other long term (current) drug therapy: Secondary | ICD-10-CM | POA: Diagnosis not present

## 2020-08-01 DIAGNOSIS — D6481 Anemia due to antineoplastic chemotherapy: Secondary | ICD-10-CM | POA: Diagnosis not present

## 2020-08-01 DIAGNOSIS — Z95828 Presence of other vascular implants and grafts: Secondary | ICD-10-CM

## 2020-08-01 DIAGNOSIS — C50112 Malignant neoplasm of central portion of left female breast: Secondary | ICD-10-CM

## 2020-08-01 DIAGNOSIS — Z90722 Acquired absence of ovaries, bilateral: Secondary | ICD-10-CM | POA: Diagnosis not present

## 2020-08-01 DIAGNOSIS — E109 Type 1 diabetes mellitus without complications: Secondary | ICD-10-CM | POA: Insufficient documentation

## 2020-08-01 DIAGNOSIS — Z5112 Encounter for antineoplastic immunotherapy: Secondary | ICD-10-CM | POA: Diagnosis present

## 2020-08-01 DIAGNOSIS — Z86718 Personal history of other venous thrombosis and embolism: Secondary | ICD-10-CM | POA: Diagnosis not present

## 2020-08-01 DIAGNOSIS — Z90710 Acquired absence of both cervix and uterus: Secondary | ICD-10-CM | POA: Diagnosis not present

## 2020-08-01 DIAGNOSIS — Z794 Long term (current) use of insulin: Secondary | ICD-10-CM | POA: Insufficient documentation

## 2020-08-01 DIAGNOSIS — C50912 Malignant neoplasm of unspecified site of left female breast: Secondary | ICD-10-CM | POA: Insufficient documentation

## 2020-08-01 DIAGNOSIS — D509 Iron deficiency anemia, unspecified: Secondary | ICD-10-CM

## 2020-08-01 DIAGNOSIS — C50812 Malignant neoplasm of overlapping sites of left female breast: Secondary | ICD-10-CM

## 2020-08-01 DIAGNOSIS — C773 Secondary and unspecified malignant neoplasm of axilla and upper limb lymph nodes: Secondary | ICD-10-CM | POA: Diagnosis present

## 2020-08-01 LAB — CBC WITH DIFFERENTIAL (CANCER CENTER ONLY)
Abs Immature Granulocytes: 0.01 10*3/uL (ref 0.00–0.07)
Basophils Absolute: 0 10*3/uL (ref 0.0–0.1)
Basophils Relative: 1 %
Eosinophils Absolute: 0.1 10*3/uL (ref 0.0–0.5)
Eosinophils Relative: 2 %
HCT: 31.9 % — ABNORMAL LOW (ref 36.0–46.0)
Hemoglobin: 9.9 g/dL — ABNORMAL LOW (ref 12.0–15.0)
Immature Granulocytes: 0 %
Lymphocytes Relative: 25 %
Lymphs Abs: 1.4 10*3/uL (ref 0.7–4.0)
MCH: 27.7 pg (ref 26.0–34.0)
MCHC: 31 g/dL (ref 30.0–36.0)
MCV: 89.1 fL (ref 80.0–100.0)
Monocytes Absolute: 0.6 10*3/uL (ref 0.1–1.0)
Monocytes Relative: 11 %
Neutro Abs: 3.5 10*3/uL (ref 1.7–7.7)
Neutrophils Relative %: 61 %
Platelet Count: 207 10*3/uL (ref 150–400)
RBC: 3.58 MIL/uL — ABNORMAL LOW (ref 3.87–5.11)
RDW: 16.8 % — ABNORMAL HIGH (ref 11.5–15.5)
WBC Count: 5.7 10*3/uL (ref 4.0–10.5)
nRBC: 0 % (ref 0.0–0.2)

## 2020-08-01 LAB — CMP (CANCER CENTER ONLY)
ALT: 23 U/L (ref 0–44)
AST: 25 U/L (ref 15–41)
Albumin: 2.4 g/dL — ABNORMAL LOW (ref 3.5–5.0)
Alkaline Phosphatase: 41 U/L (ref 38–126)
Anion gap: 2 — ABNORMAL LOW (ref 5–15)
BUN: 24 mg/dL — ABNORMAL HIGH (ref 8–23)
CO2: 28 mmol/L (ref 22–32)
Calcium: 9.4 mg/dL (ref 8.9–10.3)
Chloride: 107 mmol/L (ref 98–111)
Creatinine: 0.88 mg/dL (ref 0.44–1.00)
GFR, Estimated: >60 mL/min (ref >60)
Glucose, Bld: 120 mg/dL — ABNORMAL HIGH (ref 70–99)
Potassium: 3.8 mmol/L (ref 3.5–5.1)
Sodium: 137 mmol/L (ref 135–145)
Total Bilirubin: 0.3 mg/dL (ref 0.3–1.2)
Total Protein: 9.9 g/dL — ABNORMAL HIGH (ref 6.5–8.1)

## 2020-08-01 LAB — SAMPLE TO BLOOD BANK

## 2020-08-01 MED ORDER — SODIUM CHLORIDE 0.9 % IV SOLN
Freq: Once | INTRAVENOUS | Status: AC
  Filled 2020-08-01: qty 250

## 2020-08-01 MED ORDER — SODIUM CHLORIDE 0.9% FLUSH
10.0000 mL | Freq: Once | INTRAVENOUS | Status: AC
  Administered 2020-08-01: 10 mL
  Filled 2020-08-01: qty 10

## 2020-08-01 MED ORDER — DIPHENHYDRAMINE HCL 25 MG PO CAPS
25.0000 mg | ORAL_CAPSULE | Freq: Once | ORAL | Status: AC
  Administered 2020-08-01: 25 mg via ORAL

## 2020-08-01 MED ORDER — ACETAMINOPHEN 325 MG PO TABS
650.0000 mg | ORAL_TABLET | Freq: Once | ORAL | Status: AC
  Administered 2020-08-01: 650 mg via ORAL

## 2020-08-01 MED ORDER — HEPARIN SOD (PORK) LOCK FLUSH 100 UNIT/ML IV SOLN
500.0000 [IU] | Freq: Once | INTRAVENOUS | Status: AC | PRN
  Administered 2020-08-01: 500 [IU]
  Filled 2020-08-01: qty 5

## 2020-08-01 MED ORDER — ACETAMINOPHEN 325 MG PO TABS
ORAL_TABLET | ORAL | Status: AC
  Filled 2020-08-01: qty 2

## 2020-08-01 MED ORDER — DIPHENHYDRAMINE HCL 25 MG PO CAPS
ORAL_CAPSULE | ORAL | Status: AC
  Filled 2020-08-01: qty 1

## 2020-08-01 MED ORDER — SODIUM CHLORIDE 0.9% FLUSH
10.0000 mL | INTRAVENOUS | Status: DC | PRN
  Administered 2020-08-01: 10 mL
  Filled 2020-08-01: qty 10

## 2020-08-01 MED ORDER — SODIUM CHLORIDE 0.9 % IV SOLN
3.6000 mg/kg | Freq: Once | INTRAVENOUS | Status: AC
  Administered 2020-08-01: 360 mg via INTRAVENOUS
  Filled 2020-08-01: qty 8

## 2020-08-01 MED ORDER — EPOETIN ALFA-EPBX 40000 UNIT/ML IJ SOLN
INTRAMUSCULAR | Status: AC
  Filled 2020-08-01: qty 1

## 2020-08-01 MED ORDER — EPOETIN ALFA-EPBX 40000 UNIT/ML IJ SOLN
40000.0000 [IU] | Freq: Once | INTRAMUSCULAR | Status: AC
  Administered 2020-08-01: 40000 [IU] via SUBCUTANEOUS

## 2020-08-01 NOTE — Telephone Encounter (Signed)
Lab called, pt has BB hold. Hgb 9.9

## 2020-08-01 NOTE — Patient Instructions (Addendum)
[facility] Discharge Instructions for Patients Receiving Chemotherapy  Today you received the following chemotherapy agents: Ado - trastuzumab emtansine (Kadcyla)  To help prevent nausea and vomiting after your treatment, we encourage you to take your nausea medication  as prescribed.   If you develop nausea and vomiting that is not controlled by your nausea medication, call the clinic.   BELOW ARE SYMPTOMS THAT SHOULD BE REPORTED IMMEDIATELY:  *FEVER GREATER THAN 100.5 F  *CHILLS WITH OR WITHOUT FEVER  NAUSEA AND VOMITING THAT IS NOT CONTROLLED WITH YOUR NAUSEA MEDICATION  *UNUSUAL SHORTNESS OF BREATH  *UNUSUAL BRUISING OR BLEEDING  TENDERNESS IN MOUTH AND THROAT WITH OR WITHOUT PRESENCE OF ULCERS  *URINARY PROBLEMS  *BOWEL PROBLEMS  UNUSUAL RASH Items with * indicate a potential emergency and should be followed up as soon as possible.  Feel free to call the clinic should you have any questions or concerns. The clinic phone number is [phone].  Please show the CHEMO ALERT CARD at check-in to the Emergency Department and triage nurse.   

## 2020-08-01 NOTE — Patient Instructions (Signed)

## 2020-08-05 ENCOUNTER — Other Ambulatory Visit: Payer: Self-pay

## 2020-08-05 ENCOUNTER — Ambulatory Visit (INDEPENDENT_AMBULATORY_CARE_PROVIDER_SITE_OTHER): Payer: 59 | Admitting: Neurology

## 2020-08-05 DIAGNOSIS — R202 Paresthesia of skin: Secondary | ICD-10-CM

## 2020-08-05 DIAGNOSIS — R29898 Other symptoms and signs involving the musculoskeletal system: Secondary | ICD-10-CM | POA: Diagnosis not present

## 2020-08-05 DIAGNOSIS — G5603 Carpal tunnel syndrome, bilateral upper limbs: Secondary | ICD-10-CM

## 2020-08-05 DIAGNOSIS — R200 Anesthesia of skin: Secondary | ICD-10-CM

## 2020-08-05 DIAGNOSIS — Z0289 Encounter for other administrative examinations: Secondary | ICD-10-CM

## 2020-08-05 NOTE — Progress Notes (Signed)
EMG report is under procedure 

## 2020-08-05 NOTE — Procedures (Signed)
Full Name: [name] Gender: Female MRN #: [id number] Date of Birth: 07/29/1956    Visit Date: 08/05/2020 09:22 Age: 64 Years Examining Physician: Marcial Pacas, MD  Referring Physician: Marcial Pacas, MD Height: 4 feet 11 inch History: 64 year old female, complains of right arm paresthesia  Summary of the test: Nerve conduction study: Right median sensory response was absent.  Right median motor responses showed severely prolonged distal latency, with mildly decreased CMAP amplitude.  Right ulnar sensory response showed mildly decreased snap amplitude.  Right ulnar motor responses were normal.  Left median sensory response showed mildly prolonged peak latency, with normal snap amplitude.  Left median motor response showed mildly prolonged distal latency, with normal CMAP amplitude.  Electromyography:  Selected needle examination of right upper extremity muscles and right cervical paraspinal muscle was performed.  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 There was no spontaneous activity at right cervical paraspinal muscles.  Conclusion: This is an abnormal study.  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  There are also electrodiagnostic evidence of moderate left carpal tunnel syndromes.  There was no evidence of active right cervical radiculopathy.    ------------------------------- Marcial Pacas, M.D. PhD  [facility] [address], [place] [address] Tel: [phone] Fax: [phone]  Verbal informed consent was obtained from the patient, patient was informed of potential risk of procedure, including bruising, bleeding, hematoma formation, infection, muscle weakness, muscle pain, numbness, among others.         [place]    Nerve / Sites Muscle Latency Ref.  Amplitude Ref. Rel Amp Segments Distance Velocity Ref. Area    ms ms mV mV %  cm m/s m/s mVms  L Median - APB     Wrist APB 4.8 ?4.4 4.1 ?4.0 100 Wrist - APB 7   21.3     Upper arm APB 9.1  4.4  107 Upper arm - Wrist 21 49 ?49 20.2  R Median - APB     Wrist APB 8.5 ?4.4 3.6 ?4.0 100 Wrist - APB 7   24.8     Upper arm APB 12.5  4.2  117 Upper arm - Wrist 20 49 ?49 24.4  R Ulnar - ADM     Wrist ADM 3.3 ?3.3 8.2 ?6.0 100 Wrist - ADM 7   32.2     B.Elbow ADM 7.4  5.5  67.3 B.Elbow - Wrist 20 49 ?49 26.6     A.Elbow ADM 9.4  4.7  84.8 A.Elbow - B.Elbow 10 49 ?49 25.9         A.Elbow - Wrist               SNC    Nerve / Sites Rec. Site Peak Lat Ref.  Amp Ref. Segments Distance    ms ms V V  cm  R Radial - Anatomical snuff box (Forearm)     Forearm Wrist 2.7 ?2.9 36 ?15 Forearm - Wrist 10  L Median - Orthodromic (Dig II, Mid palm)     Dig II Wrist 4.0 ?3.4 10 ?10 Dig II - Wrist 13  R Median - Orthodromic (Dig II, Mid palm)     Dig II Wrist NR ?3.4 NR ?10 Dig II - Wrist 13  R Ulnar - Orthodromic, (Dig V, Mid palm)     Dig V Wrist 2.2 ?3.1  4 ?5 Dig V - Wrist 55             F  Wave    Nerve F Lat Ref.   ms ms  R Ulnar - ADM 30.4 ?32.0       EMG Summary Table    Spontaneous MUAP Recruitment  Muscle IA Fib PSW Fasc Other Amp Dur. Poly Pattern  R. First dorsal interosseous Increased None None None _______ Normal Normal Normal Reduced  R. Abductor pollicis brevis Increased None None None _______ Normal Normal Normal Reduced  R. Pronator teres Normal None None None _______ Normal Normal Normal Normal  R. Brachioradialis Normal None None None _______ Normal Normal Normal Normal  R. Deltoid Normal None None None _______ Normal Normal Normal Normal  R. Biceps brachii Normal None None None _______ Normal Normal Normal Normal  R. Cervical paraspinals Normal None None None _______ Normal Normal Normal Normal  R. Supraspinatus Normal None None None _______ Normal Normal Normal Normal

## 2020-08-08 DIAGNOSIS — G5603 Carpal tunnel syndrome, bilateral upper limbs: Secondary | ICD-10-CM | POA: Insufficient documentation

## 2020-08-15 ENCOUNTER — Other Ambulatory Visit: Payer: Self-pay

## 2020-08-15 ENCOUNTER — Encounter (HOSPITAL_COMMUNITY)
Admission: RE | Admit: 2020-08-15 | Discharge: 2020-08-15 | Disposition: A | Discharge status: Home / Self Care | Payer: 59 | Source: Ambulatory Visit | Attending: Oncology | Admitting: Oncology

## 2020-08-15 ENCOUNTER — Encounter: Payer: Self-pay | Admitting: Oncology

## 2020-08-15 DIAGNOSIS — Z171 Estrogen receptor negative status [ER-]: Secondary | ICD-10-CM | POA: Diagnosis present

## 2020-08-15 DIAGNOSIS — C541 Malignant neoplasm of endometrium: Secondary | ICD-10-CM

## 2020-08-15 DIAGNOSIS — C50812 Malignant neoplasm of overlapping sites of left female breast: Secondary | ICD-10-CM | POA: Diagnosis present

## 2020-08-15 DIAGNOSIS — Z7189 Other specified counseling: Secondary | ICD-10-CM | POA: Diagnosis present

## 2020-08-15 DIAGNOSIS — D0512 Intraductal carcinoma in situ of left breast: Secondary | ICD-10-CM | POA: Diagnosis present

## 2020-08-15 DIAGNOSIS — C50112 Malignant neoplasm of central portion of left female breast: Secondary | ICD-10-CM | POA: Diagnosis present

## 2020-08-15 DIAGNOSIS — K760 Fatty (change of) liver, not elsewhere classified: Secondary | ICD-10-CM

## 2020-08-15 DIAGNOSIS — C771 Secondary and unspecified malignant neoplasm of intrathoracic lymph nodes: Secondary | ICD-10-CM | POA: Diagnosis present

## 2020-08-15 LAB — GLUCOSE, CAPILLARY: Glucose-Capillary: 108 mg/dL — ABNORMAL HIGH (ref 70–99)

## 2020-08-15 MED ORDER — FLUDEOXYGLUCOSE F - 18 (FDG) INJECTION
10.4900 | Freq: Once | INTRAVENOUS | Status: AC | PRN
  Administered 2020-08-15: 10.49 via INTRAVENOUS

## 2020-08-21 ENCOUNTER — Other Ambulatory Visit: Payer: Self-pay | Admitting: *Deleted

## 2020-08-21 DIAGNOSIS — Z171 Estrogen receptor negative status [ER-]: Secondary | ICD-10-CM

## 2020-08-21 DIAGNOSIS — C50812 Malignant neoplasm of overlapping sites of left female breast: Secondary | ICD-10-CM

## 2020-08-21 DIAGNOSIS — D509 Iron deficiency anemia, unspecified: Secondary | ICD-10-CM

## 2020-08-21 DIAGNOSIS — C50112 Malignant neoplasm of central portion of left female breast: Secondary | ICD-10-CM

## 2020-08-21 NOTE — Progress Notes (Signed)
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 because of persistent diarrhea  (c) anti-HER2 therapy held after cycle 4 because of a drop in EF  (d) carbo/gemzar stopped after cycle 5, last dose 02/13/2020  (13) left breast lumpectomy and axillary node dissection on 04/10/2020 showed a residual ypT0 ypN1    (a) 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  (b) a total of 2 left axillary lymph nodes removed, one positive  (14) anti-HER-2 treatment resumed after surgery to be continued indefinitely  (a) echo 11/09/2019 shows an ejection fraction in the 60-65% range  (b) echo 02/09/2020 shows an ejection fraction in the 45 to 50% range  (c) echo 03/26/2020 shows an ejection fraction in the 55-60% range  (d) trastuzumab resumed 03/28/2020  (e) echo 05/06/2020 shows an ejection fraction in the 55-60% range  (f) trastuzumab discontinued after 06/20/2020 dose with progression  (14) switched to TDM-1/Kadcyla starting 07/11/2020  (a) new baseline PET scan 07/01/2020 shows significant supraclavicular, mediastinal and prevascular adenopathy with new small left pleural and pericardial  effusions  (b) echo 06/28/2020 shows and ejection fraction in the 55-60% range  (14) adjuvant radiation being considered depending on overall response  (16) leftlower extremity DVT documented by Doppler ultrasound 05/31/2020  (a) rivaroxaban/Xarelto started 05/31/2020   PLAN: Noelia is having a good response to the T-DM1, and she is tolerating it well.  The plan is to continue it indefinitely, although at some point in the future if we do obtain a complete radiologic response we could consider switching back to trastuzumab/Pertuzumab.  She will have her next treatment the day before Thanksgiving's.  She will then have an echocardiogram 10/03/2020.  She will see me the next day with her next Kadcyla treatment assuming all continues well.  I am going to refer her to physical therapy for the discomfort she is experiencing in the left upper extremity.  We reviewed the PET scan results in great detail.  This is very favorable.  I am hopeful we will be able to continue to treat her without interruptions to maximal response  Total encounter time 40 minutes.Sarajane Jews C. Laneisha Mino, MD 08/22/20 8:41 AM Medical Oncology and Hematology [facility] [place], [place] [address] Tel. [phone]    Fax. [phone]   I, Wilburn Mylar, am acting as scribe for Dr. [name]. Jahlia Omura.  I, Jacqueline Del MD, have reviewed the above documentation for accuracy and completeness, and I agree with the above.   *Total Encounter Time as defined by the Centers for Medicare and Medicaid Services includes, in addition to the face-to-face time of a patient visit (documented in the note above) non-face-to-face time: obtaining and reviewing outside history,  ordering and reviewing medications, tests or procedures, care coordination (communications with other health care professionals or caregivers) and documentation in the medical record.

## 2020-08-22 ENCOUNTER — Inpatient Hospital Stay: Payer: 59

## 2020-08-22 ENCOUNTER — Other Ambulatory Visit: Payer: Self-pay

## 2020-08-22 ENCOUNTER — Inpatient Hospital Stay: Payer: 59 | Attending: Oncology

## 2020-08-22 ENCOUNTER — Inpatient Hospital Stay (HOSPITAL_BASED_OUTPATIENT_CLINIC_OR_DEPARTMENT_OTHER): Payer: 59 | Admitting: Oncology

## 2020-08-22 VITALS — BP 149/81 | HR 64 | Temp 98.4°F | Resp 17 | Ht 59.0 in | Wt 217.7 lb

## 2020-08-22 VITALS — BP 159/97 | HR 67 | Temp 97.9°F | Resp 18

## 2020-08-22 DIAGNOSIS — Z90722 Acquired absence of ovaries, bilateral: Secondary | ICD-10-CM | POA: Diagnosis not present

## 2020-08-22 DIAGNOSIS — Z801 Family history of malignant neoplasm of trachea, bronchus and lung: Secondary | ICD-10-CM | POA: Diagnosis not present

## 2020-08-22 DIAGNOSIS — Z5112 Encounter for antineoplastic immunotherapy: Secondary | ICD-10-CM | POA: Diagnosis present

## 2020-08-22 DIAGNOSIS — Z8249 Family history of ischemic heart disease and other diseases of the circulatory system: Secondary | ICD-10-CM | POA: Insufficient documentation

## 2020-08-22 DIAGNOSIS — Z9079 Acquired absence of other genital organ(s): Secondary | ICD-10-CM | POA: Insufficient documentation

## 2020-08-22 DIAGNOSIS — Z8542 Personal history of malignant neoplasm of other parts of uterus: Secondary | ICD-10-CM | POA: Diagnosis not present

## 2020-08-22 DIAGNOSIS — Z170 Estrogen receptor positive status [ER+]: Secondary | ICD-10-CM | POA: Insufficient documentation

## 2020-08-22 DIAGNOSIS — Z171 Estrogen receptor negative status [ER-]: Secondary | ICD-10-CM

## 2020-08-22 DIAGNOSIS — C50812 Malignant neoplasm of overlapping sites of left female breast: Secondary | ICD-10-CM

## 2020-08-22 DIAGNOSIS — D509 Iron deficiency anemia, unspecified: Secondary | ICD-10-CM

## 2020-08-22 DIAGNOSIS — C773 Secondary and unspecified malignant neoplasm of axilla and upper limb lymph nodes: Secondary | ICD-10-CM | POA: Diagnosis present

## 2020-08-22 DIAGNOSIS — D6481 Anemia due to antineoplastic chemotherapy: Secondary | ICD-10-CM | POA: Diagnosis not present

## 2020-08-22 DIAGNOSIS — C50412 Malignant neoplasm of upper-outer quadrant of left female breast: Secondary | ICD-10-CM | POA: Insufficient documentation

## 2020-08-22 DIAGNOSIS — Z803 Family history of malignant neoplasm of breast: Secondary | ICD-10-CM | POA: Insufficient documentation

## 2020-08-22 DIAGNOSIS — C50112 Malignant neoplasm of central portion of left female breast: Secondary | ICD-10-CM

## 2020-08-22 DIAGNOSIS — Z95828 Presence of other vascular implants and grafts: Secondary | ICD-10-CM

## 2020-08-22 DIAGNOSIS — T451X5A Adverse effect of antineoplastic and immunosuppressive drugs, initial encounter: Secondary | ICD-10-CM | POA: Diagnosis not present

## 2020-08-22 DIAGNOSIS — Z90710 Acquired absence of both cervix and uterus: Secondary | ICD-10-CM | POA: Insufficient documentation

## 2020-08-22 DIAGNOSIS — Z79899 Other long term (current) drug therapy: Secondary | ICD-10-CM | POA: Insufficient documentation

## 2020-08-22 DIAGNOSIS — E108 Type 1 diabetes mellitus with unspecified complications: Secondary | ICD-10-CM | POA: Diagnosis not present

## 2020-08-22 DIAGNOSIS — Z7901 Long term (current) use of anticoagulants: Secondary | ICD-10-CM | POA: Diagnosis not present

## 2020-08-22 DIAGNOSIS — Z833 Family history of diabetes mellitus: Secondary | ICD-10-CM | POA: Insufficient documentation

## 2020-08-22 DIAGNOSIS — I10 Essential (primary) hypertension: Secondary | ICD-10-CM | POA: Diagnosis not present

## 2020-08-22 LAB — CBC WITH DIFFERENTIAL (CANCER CENTER ONLY)
Abs Immature Granulocytes: 0.02 10*3/uL (ref 0.00–0.07)
Basophils Absolute: 0 10*3/uL (ref 0.0–0.1)
Basophils Relative: 0 %
Eosinophils Absolute: 0.1 10*3/uL (ref 0.0–0.5)
Eosinophils Relative: 1 %
HCT: 32 % — ABNORMAL LOW (ref 36.0–46.0)
Hemoglobin: 9.9 g/dL — ABNORMAL LOW (ref 12.0–15.0)
Immature Granulocytes: 0 %
Lymphocytes Relative: 26 %
Lymphs Abs: 1.3 10*3/uL (ref 0.7–4.0)
MCH: 28.5 pg (ref 26.0–34.0)
MCHC: 30.9 g/dL (ref 30.0–36.0)
MCV: 92.2 fL (ref 80.0–100.0)
Monocytes Absolute: 0.7 10*3/uL (ref 0.1–1.0)
Monocytes Relative: 13 %
Neutro Abs: 3.2 10*3/uL (ref 1.7–7.7)
Neutrophils Relative %: 60 %
Platelet Count: 184 10*3/uL (ref 150–400)
RBC: 3.47 MIL/uL — ABNORMAL LOW (ref 3.87–5.11)
RDW: 18.1 % — ABNORMAL HIGH (ref 11.5–15.5)
WBC Count: 5.3 10*3/uL (ref 4.0–10.5)
nRBC: 0 % (ref 0.0–0.2)

## 2020-08-22 LAB — CMP (CANCER CENTER ONLY)
ALT: 22 U/L (ref 0–44)
AST: 27 U/L (ref 15–41)
Albumin: 2.6 g/dL — ABNORMAL LOW (ref 3.5–5.0)
Alkaline Phosphatase: 53 U/L (ref 38–126)
Anion gap: 4 — ABNORMAL LOW (ref 5–15)
BUN: 27 mg/dL — ABNORMAL HIGH (ref 8–23)
CO2: 27 mmol/L (ref 22–32)
Calcium: 8.8 mg/dL — ABNORMAL LOW (ref 8.9–10.3)
Chloride: 106 mmol/L (ref 98–111)
Creatinine: 0.94 mg/dL (ref 0.44–1.00)
GFR, Estimated: >60 mL/min (ref >60)
Glucose, Bld: 110 mg/dL — ABNORMAL HIGH (ref 70–99)
Potassium: 3.8 mmol/L (ref 3.5–5.1)
Sodium: 137 mmol/L (ref 135–145)
Total Bilirubin: 0.4 mg/dL (ref 0.3–1.2)
Total Protein: 9.3 g/dL — ABNORMAL HIGH (ref 6.5–8.1)

## 2020-08-22 MED ORDER — SODIUM CHLORIDE 0.9 % IV SOLN
Freq: Once | INTRAVENOUS | Status: AC
  Filled 2020-08-22: qty 250

## 2020-08-22 MED ORDER — EPOETIN ALFA-EPBX 40000 UNIT/ML IJ SOLN
INTRAMUSCULAR | Status: AC
  Filled 2020-08-22: qty 1

## 2020-08-22 MED ORDER — HEPARIN SOD (PORK) LOCK FLUSH 100 UNIT/ML IV SOLN
500.0000 [IU] | Freq: Once | INTRAVENOUS | Status: AC | PRN
  Administered 2020-08-22: 500 [IU]
  Filled 2020-08-22: qty 5

## 2020-08-22 MED ORDER — SODIUM CHLORIDE 0.9% FLUSH
10.0000 mL | Freq: Once | INTRAVENOUS | Status: AC
  Administered 2020-08-22: 10 mL
  Filled 2020-08-22: qty 10

## 2020-08-22 MED ORDER — EPOETIN ALFA-EPBX 40000 UNIT/ML IJ SOLN
40000.0000 [IU] | Freq: Once | INTRAMUSCULAR | Status: AC
  Administered 2020-08-22: 40000 [IU] via SUBCUTANEOUS

## 2020-08-22 MED ORDER — SODIUM CHLORIDE 0.9 % IV SOLN
3.6000 mg/kg | Freq: Once | INTRAVENOUS | Status: AC
  Administered 2020-08-22: 360 mg via INTRAVENOUS
  Filled 2020-08-22: qty 8

## 2020-08-22 MED ORDER — DIPHENHYDRAMINE HCL 25 MG PO CAPS
ORAL_CAPSULE | ORAL | Status: AC
  Filled 2020-08-22: qty 1

## 2020-08-22 MED ORDER — ACETAMINOPHEN 325 MG PO TABS
650.0000 mg | ORAL_TABLET | Freq: Once | ORAL | Status: AC
  Administered 2020-08-22: 650 mg via ORAL

## 2020-08-22 MED ORDER — SODIUM CHLORIDE 0.9% FLUSH
10.0000 mL | INTRAVENOUS | Status: DC | PRN
  Administered 2020-08-22: 10 mL
  Filled 2020-08-22: qty 10

## 2020-08-22 MED ORDER — DIPHENHYDRAMINE HCL 25 MG PO CAPS
25.0000 mg | ORAL_CAPSULE | Freq: Once | ORAL | Status: AC
  Administered 2020-08-22: 25 mg via ORAL

## 2020-08-22 MED ORDER — ACETAMINOPHEN 325 MG PO TABS
ORAL_TABLET | ORAL | Status: AC
  Filled 2020-08-22: qty 2

## 2020-08-22 NOTE — Patient Instructions (Signed)

## 2020-08-22 NOTE — Patient Instructions (Signed)
[facility] Discharge Instructions for Patients Receiving Chemotherapy  Today you received the following chemotherapy agents: Ado - trastuzumab emtansine (Kadcyla)  To help prevent nausea and vomiting after your treatment, we encourage you to take your nausea medication  as prescribed.   If you develop nausea and vomiting that is not controlled by your nausea medication, call the clinic.   BELOW ARE SYMPTOMS THAT SHOULD BE REPORTED IMMEDIATELY:  *FEVER GREATER THAN 100.5 F  *CHILLS WITH OR WITHOUT FEVER  NAUSEA AND VOMITING THAT IS NOT CONTROLLED WITH YOUR NAUSEA MEDICATION  *UNUSUAL SHORTNESS OF BREATH  *UNUSUAL BRUISING OR BLEEDING  TENDERNESS IN MOUTH AND THROAT WITH OR WITHOUT PRESENCE OF ULCERS  *URINARY PROBLEMS  *BOWEL PROBLEMS  UNUSUAL RASH Items with * indicate a potential emergency and should be followed up as soon as possible.  Feel free to call the clinic should you have any questions or concerns. The clinic phone number is [phone].  Please show the CHEMO ALERT CARD at check-in to the Emergency Department and triage nurse.   

## 2020-08-26 ENCOUNTER — Telehealth: Payer: Self-pay | Admitting: Oncology

## 2020-08-26 NOTE — Telephone Encounter (Signed)
Scheduled per 11/4 los. Pt will receive an updated appt calendar per next appt notes

## 2020-08-27 ENCOUNTER — Other Ambulatory Visit: Payer: Self-pay

## 2020-08-27 ENCOUNTER — Ambulatory Visit: Payer: 59 | Attending: Oncology

## 2020-08-27 DIAGNOSIS — M79602 Pain in left arm: Secondary | ICD-10-CM

## 2020-08-27 DIAGNOSIS — Z483 Aftercare following surgery for neoplasm: Secondary | ICD-10-CM | POA: Insufficient documentation
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
## 2020-08-28 DIAGNOSIS — M25512 Pain in left shoulder: Secondary | ICD-10-CM | POA: Insufficient documentation

## 2020-08-28 DIAGNOSIS — M542 Cervicalgia: Secondary | ICD-10-CM | POA: Insufficient documentation

## 2020-08-29 ENCOUNTER — Other Ambulatory Visit: Payer: Self-pay | Admitting: Orthopedic Surgery

## 2020-08-29 ENCOUNTER — Encounter: Payer: Self-pay | Admitting: *Deleted

## 2020-08-29 DIAGNOSIS — M542 Cervicalgia: Secondary | ICD-10-CM

## 2020-09-02 ENCOUNTER — Telehealth: Payer: Self-pay | Admitting: *Deleted

## 2020-09-02 NOTE — Telephone Encounter (Signed)
Received call from pt with complaint of severe left arm pain radiating from her neck.  Pt seen by orthopedics and pt scheduled for MRI on 09/09/20 but states pain is so severe she would like to be seen by [place].  Per MD pt to be seen in [facility] tomorrow 09/03/20.  High priority message sent to scheduling.

## 2020-09-03 ENCOUNTER — Inpatient Hospital Stay (HOSPITAL_BASED_OUTPATIENT_CLINIC_OR_DEPARTMENT_OTHER): Payer: 59 | Admitting: Medical

## 2020-09-03 ENCOUNTER — Other Ambulatory Visit: Payer: Self-pay

## 2020-09-03 VITALS — BP 137/100 | HR 81 | Temp 98.1°F | Resp 18 | Ht 59.0 in | Wt 210.3 lb

## 2020-09-03 DIAGNOSIS — Z5112 Encounter for antineoplastic immunotherapy: Secondary | ICD-10-CM | POA: Diagnosis not present

## 2020-09-03 DIAGNOSIS — G5603 Carpal tunnel syndrome, bilateral upper limbs: Secondary | ICD-10-CM | POA: Diagnosis not present

## 2020-09-03 DIAGNOSIS — C541 Malignant neoplasm of endometrium: Secondary | ICD-10-CM

## 2020-09-03 MED ORDER — GABAPENTIN 100 MG PO CAPS: 200.0000 mg | ORAL_CAPSULE | Freq: Every day | ORAL | 5 refills | Status: DC

## 2020-09-03 MED ORDER — TRAMADOL HCL 50 MG PO TABS: ORAL_TABLET | ORAL | 0 refills | Status: DC
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
## 2020-09-08 ENCOUNTER — Ambulatory Visit
Admission: RE | Admit: 2020-09-08 | Discharge: 2020-09-08 | Disposition: A | Discharge status: Home / Self Care | Payer: 59 | Source: Ambulatory Visit | Attending: Orthopedic Surgery | Admitting: Orthopedic Surgery

## 2020-09-08 ENCOUNTER — Other Ambulatory Visit: Payer: Self-pay

## 2020-09-08 DIAGNOSIS — M542 Cervicalgia: Secondary | ICD-10-CM

## 2020-09-09 ENCOUNTER — Other Ambulatory Visit: Payer: 59

## 2020-09-11 ENCOUNTER — Inpatient Hospital Stay (HOSPITAL_BASED_OUTPATIENT_CLINIC_OR_DEPARTMENT_OTHER): Payer: 59 | Admitting: Adult Health

## 2020-09-11 ENCOUNTER — Inpatient Hospital Stay: Payer: 59

## 2020-09-11 ENCOUNTER — Encounter: Payer: Self-pay | Admitting: Adult Health

## 2020-09-11 ENCOUNTER — Other Ambulatory Visit: Payer: Self-pay

## 2020-09-11 ENCOUNTER — Telehealth: Payer: Self-pay

## 2020-09-11 VITALS — BP 140/98 | HR 91 | Temp 97.4°F | Resp 17 | Ht 59.0 in | Wt 202.2 lb

## 2020-09-11 DIAGNOSIS — C771 Secondary and unspecified malignant neoplasm of intrathoracic lymph nodes: Secondary | ICD-10-CM | POA: Diagnosis not present

## 2020-09-11 DIAGNOSIS — M5412 Radiculopathy, cervical region: Secondary | ICD-10-CM

## 2020-09-11 DIAGNOSIS — Z171 Estrogen receptor negative status [ER-]: Secondary | ICD-10-CM

## 2020-09-11 DIAGNOSIS — Z5112 Encounter for antineoplastic immunotherapy: Secondary | ICD-10-CM | POA: Diagnosis not present

## 2020-09-11 DIAGNOSIS — C50812 Malignant neoplasm of overlapping sites of left female breast: Secondary | ICD-10-CM | POA: Diagnosis not present

## 2020-09-11 DIAGNOSIS — C50112 Malignant neoplasm of central portion of left female breast: Secondary | ICD-10-CM

## 2020-09-11 LAB — CBC WITH DIFFERENTIAL (CANCER CENTER ONLY)
Abs Immature Granulocytes: 0.03 10*3/uL (ref 0.00–0.07)
Basophils Absolute: 0 10*3/uL (ref 0.0–0.1)
Basophils Relative: 1 %
Eosinophils Absolute: 0 10*3/uL (ref 0.0–0.5)
Eosinophils Relative: 0 %
HCT: 37.5 % (ref 36.0–46.0)
Hemoglobin: 12.1 g/dL (ref 12.0–15.0)
Immature Granulocytes: 0 %
Lymphocytes Relative: 17 %
Lymphs Abs: 1.4 10*3/uL (ref 0.7–4.0)
MCH: 28.7 pg (ref 26.0–34.0)
MCHC: 32.3 g/dL (ref 30.0–36.0)
MCV: 89.1 fL (ref 80.0–100.0)
Monocytes Absolute: 1.1 10*3/uL — ABNORMAL HIGH (ref 0.1–1.0)
Monocytes Relative: 13 %
Neutro Abs: 5.6 10*3/uL (ref 1.7–7.7)
Neutrophils Relative %: 69 %
Platelet Count: 167 10*3/uL (ref 150–400)
RBC: 4.21 MIL/uL (ref 3.87–5.11)
RDW: 16.7 % — ABNORMAL HIGH (ref 11.5–15.5)
WBC Count: 8.1 10*3/uL (ref 4.0–10.5)
nRBC: 0 % (ref 0.0–0.2)

## 2020-09-11 LAB — CMP (CANCER CENTER ONLY)
ALT: 19 U/L (ref 0–44)
AST: 32 U/L (ref 15–41)
Albumin: 3.1 g/dL — ABNORMAL LOW (ref 3.5–5.0)
Alkaline Phosphatase: 49 U/L (ref 38–126)
Anion gap: 13 (ref 5–15)
BUN: 24 mg/dL — ABNORMAL HIGH (ref 8–23)
CO2: 24 mmol/L (ref 22–32)
Calcium: 10.2 mg/dL (ref 8.9–10.3)
Chloride: 96 mmol/L — ABNORMAL LOW (ref 98–111)
Creatinine: 1.57 mg/dL — ABNORMAL HIGH (ref 0.44–1.00)
GFR, Estimated: 37 mL/min — ABNORMAL LOW (ref >60)
Glucose, Bld: 103 mg/dL — ABNORMAL HIGH (ref 70–99)
Potassium: 3.1 mmol/L — ABNORMAL LOW (ref 3.5–5.1)
Sodium: 133 mmol/L — ABNORMAL LOW (ref 135–145)
Total Bilirubin: 0.6 mg/dL (ref 0.3–1.2)
Total Protein: 10.1 g/dL — ABNORMAL HIGH (ref 6.5–8.1)

## 2020-09-11 MED ORDER — DIPHENHYDRAMINE HCL 25 MG PO CAPS
25.0000 mg | ORAL_CAPSULE | Freq: Once | ORAL | Status: AC
  Administered 2020-09-11: 25 mg via ORAL

## 2020-09-11 MED ORDER — ACETAMINOPHEN 325 MG PO TABS
650.0000 mg | ORAL_TABLET | Freq: Once | ORAL | Status: AC
  Administered 2020-09-11: 650 mg via ORAL

## 2020-09-11 MED ORDER — SODIUM CHLORIDE 0.9% FLUSH
10.0000 mL | INTRAVENOUS | Status: DC | PRN
  Administered 2020-09-11: 10 mL
  Filled 2020-09-11: qty 10

## 2020-09-11 MED ORDER — HEPARIN SOD (PORK) LOCK FLUSH 100 UNIT/ML IV SOLN
500.0000 [IU] | Freq: Once | INTRAVENOUS | Status: AC | PRN
  Administered 2020-09-11: 500 [IU]
  Filled 2020-09-11: qty 5

## 2020-09-11 MED ORDER — SODIUM CHLORIDE 0.9 % IV SOLN: 4.0000 mg | Freq: Once | INTRAVENOUS | Status: DC

## 2020-09-11 MED ORDER — ACETAMINOPHEN 325 MG PO TABS
ORAL_TABLET | ORAL | Status: AC
  Filled 2020-09-11: qty 2

## 2020-09-11 MED ORDER — DEXAMETHASONE 4 MG PO TABS: 4.0000 mg | ORAL_TABLET | Freq: Every day | ORAL | 0 refills | Status: DC

## 2020-09-11 MED ORDER — DEXAMETHASONE SODIUM PHOSPHATE 10 MG/ML IJ SOLN
INTRAMUSCULAR | Status: AC
  Filled 2020-09-11: qty 1

## 2020-09-11 MED ORDER — DIPHENHYDRAMINE HCL 25 MG PO CAPS
ORAL_CAPSULE | ORAL | Status: AC
  Filled 2020-09-11: qty 1

## 2020-09-11 MED ORDER — DEXAMETHASONE SODIUM PHOSPHATE 10 MG/ML IJ SOLN
4.0000 mg | Freq: Once | INTRAMUSCULAR | Status: AC
  Administered 2020-09-11: 4 mg via INTRAVENOUS

## 2020-09-11 MED ORDER — SODIUM CHLORIDE 0.9 % IV SOLN
Freq: Once | INTRAVENOUS | Status: AC
  Filled 2020-09-11: qty 250

## 2020-09-11 MED ORDER — SODIUM CHLORIDE 0.9 % IV SOLN
3.6000 mg/kg | Freq: Once | INTRAVENOUS | Status: AC
  Administered 2020-09-11: 360 mg via INTRAVENOUS
  Filled 2020-09-11: qty 8

## 2020-09-11 MED ORDER — OXYCODONE HCL 5 MG PO TABS
5.0000 mg | ORAL_TABLET | ORAL | 0 refills | Status: DC | PRN
Start: 2020-09-11 — End: 2021-05-21

## 2020-09-11 NOTE — Progress Notes (Signed)
Per Wilber Bihari NP, ok to treat with elevated creatinine.

## 2020-09-11 NOTE — Patient Instructions (Signed)

## 2020-09-11 NOTE — Telephone Encounter (Signed)
-----   Message from Gardenia Phlegm, NP sent at 09/11/2020  1:00 PM EST ----- Hey.  Please call patient and let her know that I sent in the oxycodone.  Please make sure she has enough ondansetron and if not, please refill that (I believe she had 8mg  TID PRN).  Please ask her to also stop her night time lasix for the next few days until she starts feeling better.  And to call me on Friday if not.    Thanks,    Branch

## 2020-09-11 NOTE — Telephone Encounter (Signed)
Called and left below message. Ask her to call the office back. 

## 2020-09-11 NOTE — Patient Instructions (Signed)
[facility] Discharge Instructions for Patients Receiving Chemotherapy  Today you received the following chemotherapy agents: ado-trastuzumab emtansine.  To help prevent nausea and vomiting after your treatment, we encourage you to take your nausea medication as directed.   If you develop nausea and vomiting that is not controlled by your nausea medication, call the clinic.   BELOW ARE SYMPTOMS THAT SHOULD BE REPORTED IMMEDIATELY:  *FEVER GREATER THAN 100.5 F  *CHILLS WITH OR WITHOUT FEVER  NAUSEA AND VOMITING THAT IS NOT CONTROLLED WITH YOUR NAUSEA MEDICATION  *UNUSUAL SHORTNESS OF BREATH  *UNUSUAL BRUISING OR BLEEDING  TENDERNESS IN MOUTH AND THROAT WITH OR WITHOUT PRESENCE OF ULCERS  *URINARY PROBLEMS  *BOWEL PROBLEMS  UNUSUAL RASH Items with * indicate a potential emergency and should be followed up as soon as possible.  Feel free to call the clinic should you have any questions or concerns. The clinic phone number is [phone].  Please show the CHEMO ALERT CARD at check-in to the Emergency Department and triage nurse.   

## 2020-09-11 NOTE — Progress Notes (Signed)
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 variant only), EPCAM (Deletion/duplication testing  only), FH, FLCN, GATA2, GPC3, GREM1 (Promoter region deletion/duplication testing only), HOXB13 (c.251G>A, p.Gly84Glu), HRAS, KIT, MAX, MEN1, MET, MITF (c.952G>A, p.Glu318Lys variant only), MLH1, MSH2, MSH3, MSH6, MUTYH, NBN, NF1, NF2, NTHL1, PALB2, PDGFRA, PHOX2B, PMS2, POLD1, POLE, POT1, PRKAR1A, PTCH1, PTEN, RAD50, RAD51C, RAD51D, RB1, RECQL4, RET, RNF43, RUNX1, SDHAF2, SDHA (sequence changes only), SDHB, SDHC, SDHD, SMAD4, SMARCA4, SMARCB1, SMARCE1, STK11, SUFU, TERC, TERT, TMEM127, TP53, TSC1, TSC2, VHL, WRN and WT1.    C: METASTATIC BREAST CANCER: JAN 2021 (11) left axillary lymph node biopsy 10/30/2019 documents invasive mammary carcinoma, grade 3, estrogen receptor positive (80%, weak), progesterone receptor negative, HER-2 amplified (3+) MIB-70%  (a) breast MRI 11/23/2019 shows 3.6 cm non-mass-like enhancement in the left breast, with a second more clumped area measuring 4.8 cm, and at least 5 morphologically abnormal left axillary lymph node.  There is a left subpectoral lymph node and a left internal mammary lymph node noted as  well.  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  (b) pertuzumab held with cycle 2 because of persistent diarrhea  (c) anti-HER2 therapy held after cycle 4 because of a drop in EF  (d) carbo/gemzar stopped after cycle 5, last dose 02/13/2020  (13) left breast lumpectomy and axillary node dissection on 04/10/2020 showed a residual ypT0 ypN1    (a) 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  (b) a total of 2 left axillary lymph nodes removed, one positive  (14) anti-HER-2 treatment resumed after surgery to be continued indefinitely  (a) echo 11/09/2019 shows an ejection fraction in the 60-65% range  (b) echo 02/09/2020 shows an ejection fraction in the 45 to 50% range  (c) echo 03/26/2020 shows an ejection fraction in the 55-60% range  (d) trastuzumab resumed 03/28/2020  (e) echo 05/06/2020 shows an ejection fraction in the 55-60% range  (f) trastuzumab discontinued after 06/20/2020 dose with progression  (14) switched to TDM-1/Kadcyla starting 07/11/2020  (a) new baseline PET scan 07/01/2020 shows significant supraclavicular, mediastinal and prevascular adenopathy with new small left pleural and pericardial effusions  (b) echo 06/28/2020 shows and ejection fraction in the 55-60% range  (c) PET scan on 08/15/2020 shows interval response to chemotherapy with decrease in size and SUV of lymphadenopathy, no new or progressive disease identified in abdomen, pelvis, or bones  (14) adjuvant radiation being considered depending on overall response  (16) leftlower extremity DVT  documented by Doppler ultrasound 05/31/2020  (a) rivaroxaban/Xarelto started 05/31/2020   PLAN: Brailee continues on TDM1 with good tolerance and evidence of response on her most recent restaging PET scan last month.  That same scan did not identify any bone lesions of concern that would indicate metastases.    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  She will receive 33m IV dexamethasone in treatment today, will take dexamethasone 415mdaily with food starting tomorrow morning.  I refilled Oxycodone at her request #30 due to her pain, and I suggested she take 1/2 tab, with 1/2 tab of zofran 30 minutes prior.  I recommended she take miralax and colace to prevent opoid induced constipation.  We discussed the  purchase and use of a cervical support pillow when sleeping and I showed her picture examples of these online.  I also placed a referral to Dr. Vertell Limber in neurosurgery to urgently evaluate her considering her symptoms.    Ellieana is dehydrated.  She will receive IV fluids today in addition to her treatment.  Now that she is receiving steroids she will hopefully improve.  I have cautioned her about taking dexamethasone while being on xarelto and she understands she must eat a meal when taking the medication and that there is a risk of stomach upset and bleeding with taking both of these, however benefits of the steroids now outweigh the risks involved with taking them.  I recommended   Tenesha will call me on Friday if she is not feeling better than she feels today.  She knows to call for any questions that may arise between now and her next appointment.  We are happy to see her sooner if needed.   Total encounter time 45 minutes.Wilber Bihari, NP 09/11/20 12:41 PM Medical Oncology and Hematology [facility] [place], [place] [address] Tel. [phone]    Fax. [phone]    *Total Encounter Time as defined  by the Centers for Medicare and Medicaid Services includes, in addition to the face-to-face time of a patient visit (documented in the note above) non-face-to-face time: obtaining and reviewing outside history, ordering and reviewing medications, tests or procedures, care coordination (communications with other health care professionals or caregivers) and documentation in the medical record.

## 2020-09-13 ENCOUNTER — Telehealth: Payer: Self-pay | Admitting: Adult Health

## 2020-09-13 NOTE — Telephone Encounter (Signed)
No 11/24 los. No changes made to pt's schedule.

## 2020-09-16 IMAGING — DX DG CHEST 2V
2 series · 2 of 2 positions shown · non-contrast
Comparison: [DATE] [DATE], [DATE].  [DATE] [DATE], [DATE].

CLINICAL DATA: Dyspnea.

EXAM:
CHEST - 2 VIEW

[chest pa]
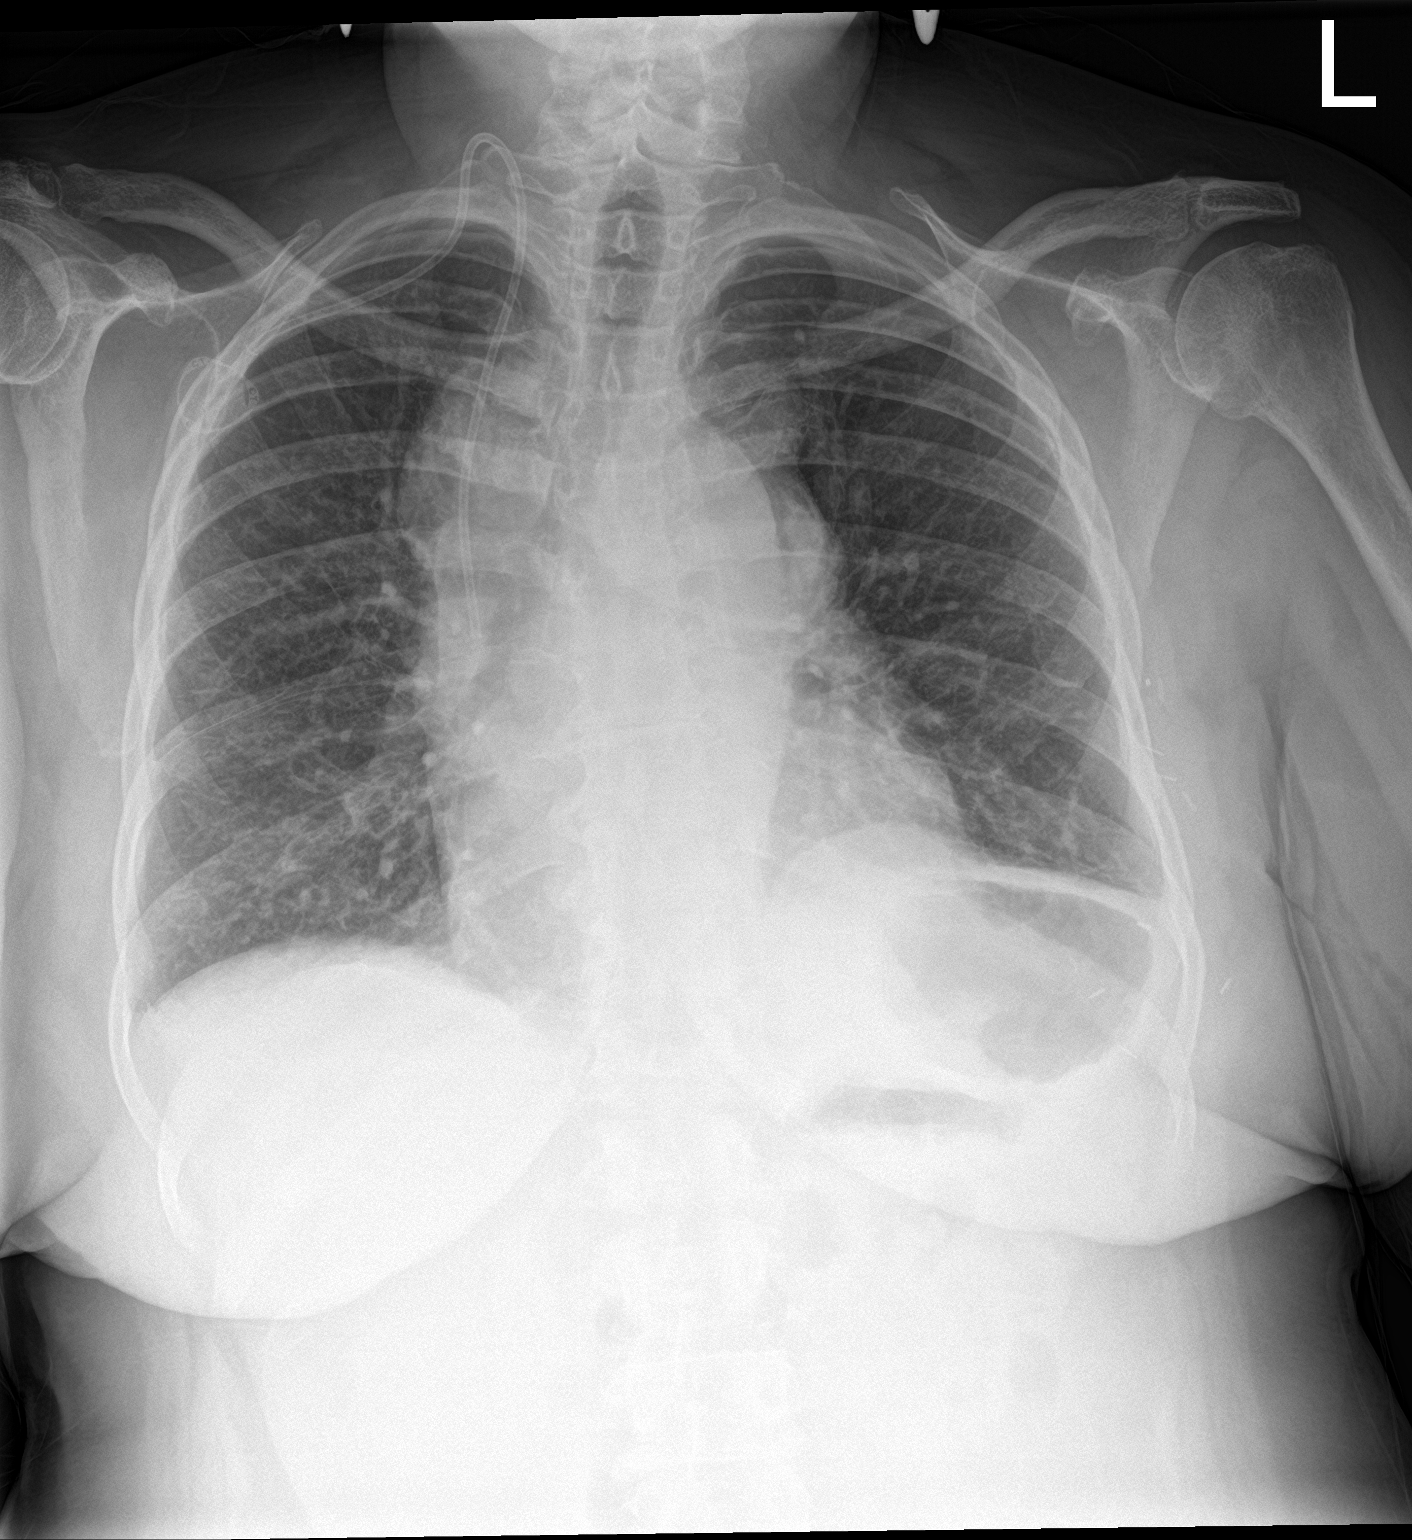

[chest lat]
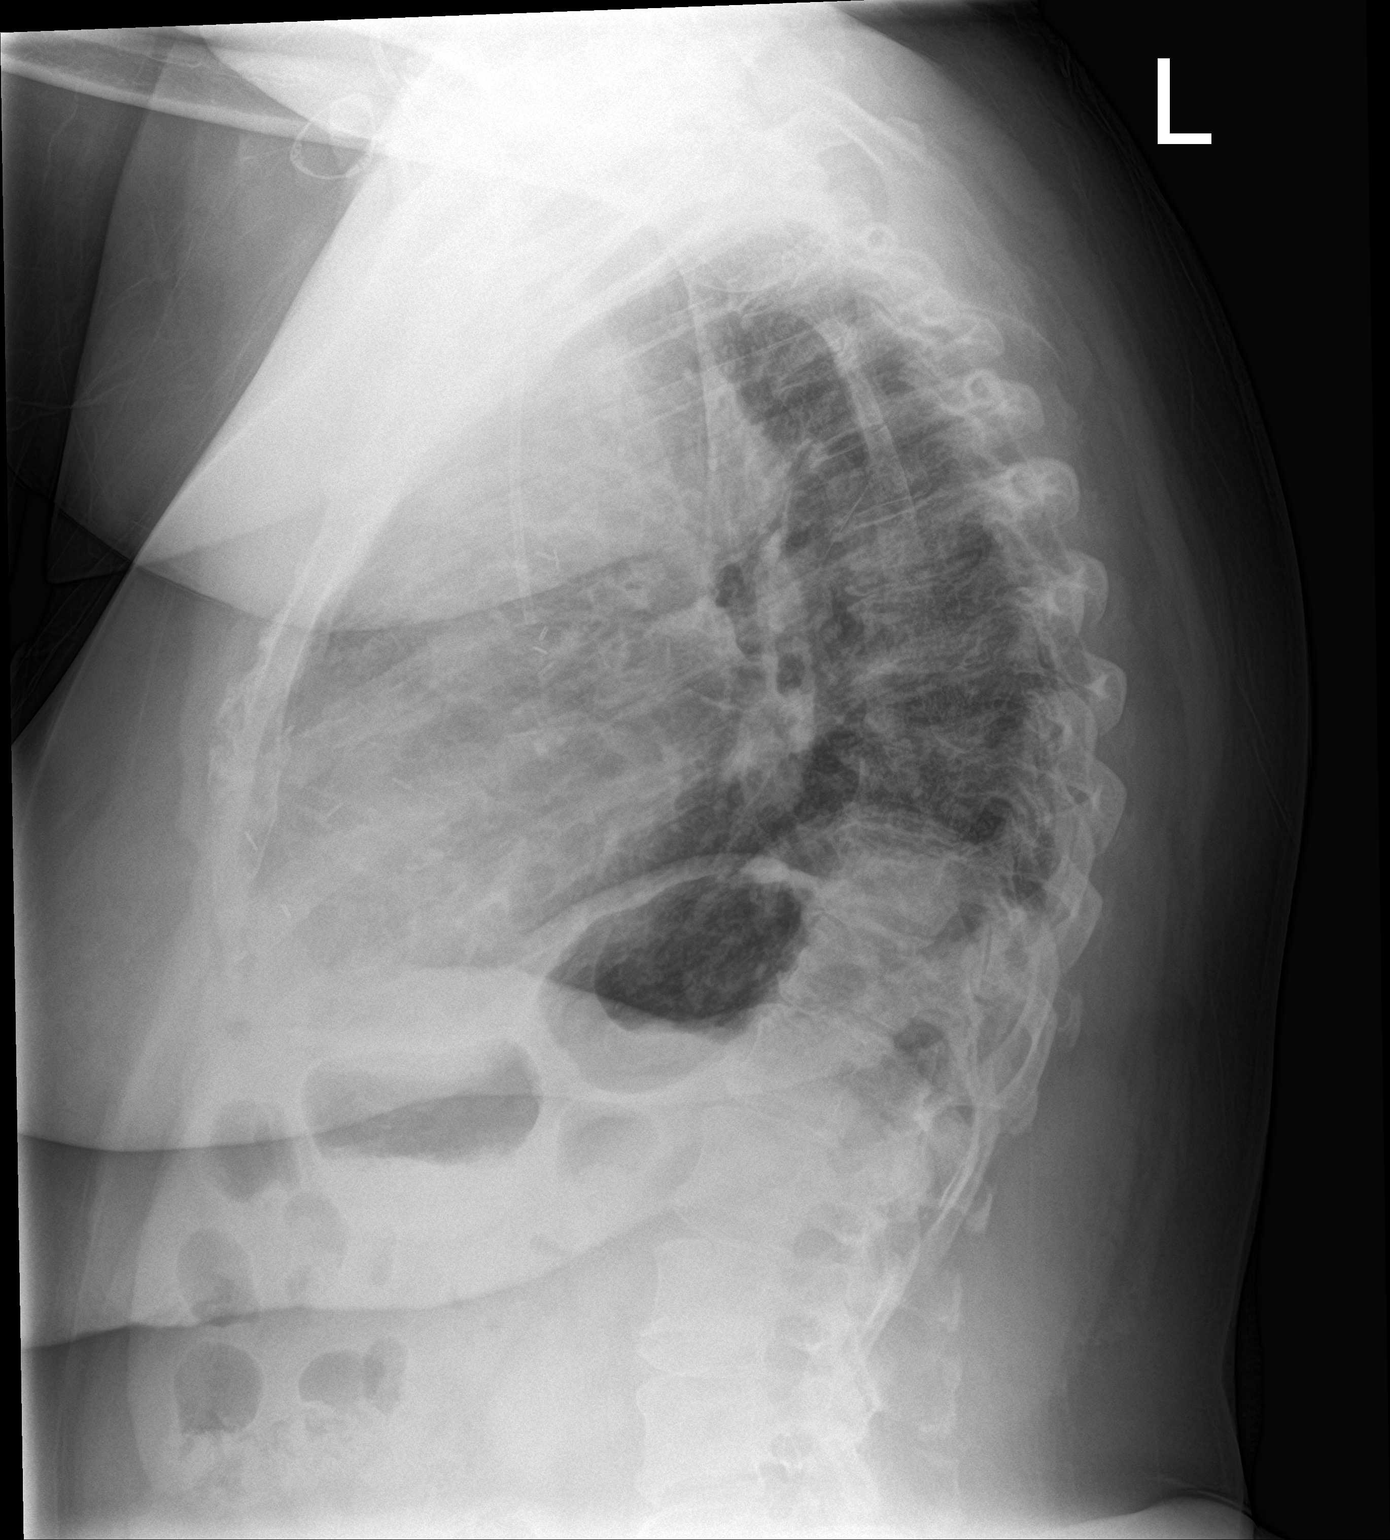

[2 of 2 positions shown; findings below may reference images not displayed]

FINDINGS: There is increased prominence of the bilateral paratracheal regions
concerning for adenopathy. Right internal jugular Port-A-Cath is
unchanged in position. No pneumothorax or pleural effusion is noted.
Lungs are clear. Bony thorax is unremarkable.
IMPRESSION: Increased prominence of bilateral paratracheal regions concerning
for enlarged adenopathy, which is concerning for metastatic disease.

## 2020-09-16 NOTE — Progress Notes (Signed)
Referral faxed to Dr Vertell Limber at [place] [phone]; fax conf. received. Fax included most recent progress note, and demographic info.

## 2020-09-17 ENCOUNTER — Other Ambulatory Visit: Payer: 59

## 2020-09-20 ENCOUNTER — Other Ambulatory Visit: Payer: Self-pay | Admitting: Oncology

## 2020-09-20 ENCOUNTER — Other Ambulatory Visit (HOSPITAL_COMMUNITY): Payer: Self-pay | Admitting: Cardiology

## 2020-09-23 DIAGNOSIS — M4712 Other spondylosis with myelopathy, cervical region: Secondary | ICD-10-CM | POA: Insufficient documentation

## 2020-09-24 ENCOUNTER — Other Ambulatory Visit: Payer: Self-pay | Admitting: Orthopedic Surgery

## 2020-09-24 DIAGNOSIS — M542 Cervicalgia: Secondary | ICD-10-CM

## 2020-09-25 ENCOUNTER — Telehealth: Payer: Self-pay | Admitting: *Deleted

## 2020-09-25 ENCOUNTER — Other Ambulatory Visit: Payer: 59

## 2020-09-25 ENCOUNTER — Other Ambulatory Visit: Payer: Self-pay | Admitting: Neurosurgery

## 2020-09-25 NOTE — Telephone Encounter (Signed)
Message left by pt stating she will likely be proceeding with surgery next week.  She is also inquiring " does the PET scan show up anything about my thyroid "  " I am being told the MRI is showing an abnormality on my thyroid so they are wanting me to get that further checked "  This RN returned call to pt to discuss further including review of her scans- obtained VM- message left requesting a return call.

## 2020-09-26 NOTE — Progress Notes (Signed)
[id number] [address], [place] [address] Phone: [phone] Fax: [phone]      Your procedure is scheduled on October 02, 2020.  Report to [facility] Main Entrance "A" at 07:00 A.M., and check in at the Admitting office.  Call this number if you have problems the morning of surgery:  [phone]  Call [phone] if you have any questions prior to your surgery date Monday-Friday 8am-4pm   Remember:  Do not eat or drink after midnight the night before your surgery    Take these medicines the morning of surgery with A SIP OF WATER : Carvedilol (Coreg) Pravastatin (Pravachol) Prednisone (Deltasone)  If needed: Gabapentin (Neurontin) Omeprazole (Prilosec) Oxycodone (Oxy IR/Roxicodone) Tramadol (Ultram) Valacyclovir (Valtrex)  Follow your Doctor's instructions when to STOP your Rivaroxaban (XARELTO).  If no instructions were given, please call your Doctor.  As of today, STOP taking any Aspirin (unless otherwise instructed by your surgeon) Aleve, Naproxen, Ibuprofen, Motrin, Advil, Goody's, BC's, all herbal medications, fish oil, and all vitamins.    HOW TO MANAGE YOUR DIABETES BEFORE AND AFTER SURGERY  Why is it important to control my blood sugar before and after surgery? . Improving blood sugar levels before and after surgery helps healing and can limit problems. . A way of improving blood sugar control is eating a healthy diet by: o  Eating less sugar and carbohydrates o  Increasing activity/exercise o  Talking with your doctor about reaching your blood sugar goals . High blood sugars (greater than 180 mg/dL) can raise your risk of infections and slow your recovery, so you will need to focus on controlling your diabetes during the weeks before surgery. . Make sure that the doctor who takes care of your diabetes knows about your planned surgery including the date and location.  How do I manage my blood sugar  before surgery? . Check your blood sugar at least 4 times a day, starting 2 days before surgery, to make sure that the level is not too high or low. . Check your blood sugar the morning of your surgery when you wake up and every 2 hours until you get to the Short Stay unit. o If your blood sugar is less than 70 mg/dL, you will need to treat for low blood sugar: - Do not take insulin. - Treat a low blood sugar (less than 70 mg/dL) with  cup of clear juice (cranberry or apple), 4 glucose tablets, OR glucose gel. - Recheck blood sugar in 15 minutes after treatment (to make sure it is greater than 70 mg/dL). If your blood sugar is not greater than 70 mg/dL on recheck, call [phone] for further instructions. . Report your blood sugar to the short stay nurse when you get to Short Stay.  . If you are admitted to the hospital after surgery: o Your blood sugar will be checked by the staff and you will probably be given insulin after surgery (instead of oral diabetes medicines) to make sure you have good blood sugar levels. o The goal for blood sugar control after surgery is 80-180 mg/dL.                      Do not wear jewelry, make up, or nail polish            Do not wear lotions, powders, perfumes, or deodorant.            Do not shave 48 hours  prior to surgery.             Do not bring valuables to the hospital.            [facility] is not responsible for any belongings or valuables.  Do NOT Smoke (Tobacco/Vaping) or drink Alcohol 24 hours prior to your procedure If you use a CPAP at night, you may bring all equipment for your overnight stay.   Contacts, glasses, dentures or bridgework may not be worn into surgery.      For patients admitted to the hospital, discharge time will be determined by your treatment team.   Patients discharged the day of surgery will not be allowed to drive home, and someone needs to stay with them for 24 hours.    Special instructions:   [facility]-  Preparing For Surgery  Before surgery, you can play an important role. Because skin is not sterile, your skin needs to be as free of germs as possible. You can reduce the number of germs on your skin by washing with CHG (chlorahexidine gluconate) Soap before surgery.  CHG is an antiseptic cleaner which kills germs and bonds with the skin to continue killing germs even after washing.    Oral Hygiene is also important to reduce your risk of infection.  Remember - BRUSH YOUR TEETH THE MORNING OF SURGERY WITH YOUR REGULAR TOOTHPASTE  Please do not use if you have an allergy to CHG or antibacterial soaps. If your skin becomes reddened/irritated stop using the CHG.  Do not shave (including legs and underarms) for at least 48 hours prior to first CHG shower. It is OK to shave your face.  Please follow these instructions carefully.   1. Shower the NIGHT BEFORE SURGERY and the MORNING OF SURGERY with CHG Soap.   2. If you chose to wash your hair, wash your hair first as usual with your normal shampoo.  3. After you shampoo, rinse your hair and body thoroughly to remove the shampoo.  4. Use CHG as you would any other liquid soap. You can apply CHG directly to the skin and wash gently with a scrungie or a clean washcloth.   5. Apply the CHG Soap to your body ONLY FROM THE NECK DOWN.  Do not use on open wounds or open sores. Avoid contact with your eyes, ears, mouth and genitals (private parts). Wash Face and genitals (private parts)  with your normal soap.   6. Wash thoroughly, paying special attention to the area where your surgery will be performed.  7. Thoroughly rinse your body with warm water from the neck down.  8. DO NOT shower/wash with your normal soap after using and rinsing off the CHG Soap.  9. Pat yourself dry with a CLEAN TOWEL.  10. Wear CLEAN PAJAMAS to bed the night before surgery  11. Place CLEAN SHEETS on your bed the night of your first shower and DO NOT SLEEP WITH  PETS.   Day of Surgery: Wear Clean/Comfortable clothing the morning of surgery Do not apply any deodorants/lotions.   Remember to brush your teeth WITH YOUR REGULAR TOOTHPASTE.   Please read over the following fact sheets that you were given.

## 2020-09-27 ENCOUNTER — Emergency Department (HOSPITAL_COMMUNITY): Payer: 59

## 2020-09-27 ENCOUNTER — Encounter (HOSPITAL_COMMUNITY): Payer: Self-pay

## 2020-09-27 ENCOUNTER — Other Ambulatory Visit: Payer: Self-pay

## 2020-09-27 ENCOUNTER — Encounter (HOSPITAL_COMMUNITY)
Admission: RE | Admit: 2020-09-27 | Discharge: 2020-09-27 | Disposition: A | Discharge status: Home / Self Care | Payer: 59 | Source: Ambulatory Visit | Attending: Neurosurgery | Admitting: Neurosurgery

## 2020-09-27 ENCOUNTER — Encounter (HOSPITAL_COMMUNITY): Payer: Self-pay | Admitting: Emergency Medicine

## 2020-09-27 ENCOUNTER — Emergency Department (HOSPITAL_COMMUNITY)
Admission: EM | Admit: 2020-09-27 | Discharge: 2020-09-27 | Disposition: A | Discharge status: Home / Self Care | Payer: 59 | Attending: Emergency Medicine | Admitting: Emergency Medicine

## 2020-09-27 DIAGNOSIS — E1142 Type 2 diabetes mellitus with diabetic polyneuropathy: Secondary | ICD-10-CM | POA: Insufficient documentation

## 2020-09-27 DIAGNOSIS — Z79899 Other long term (current) drug therapy: Secondary | ICD-10-CM | POA: Insufficient documentation

## 2020-09-27 DIAGNOSIS — Z7901 Long term (current) use of anticoagulants: Secondary | ICD-10-CM | POA: Insufficient documentation

## 2020-09-27 DIAGNOSIS — M21371 Foot drop, right foot: Secondary | ICD-10-CM | POA: Diagnosis not present

## 2020-09-27 DIAGNOSIS — Z96652 Presence of left artificial knee joint: Secondary | ICD-10-CM | POA: Diagnosis not present

## 2020-09-27 DIAGNOSIS — M25561 Pain in right knee: Secondary | ICD-10-CM | POA: Insufficient documentation

## 2020-09-27 DIAGNOSIS — Z853 Personal history of malignant neoplasm of breast: Secondary | ICD-10-CM | POA: Diagnosis not present

## 2020-09-27 DIAGNOSIS — Z01812 Encounter for preprocedural laboratory examination: Secondary | ICD-10-CM | POA: Insufficient documentation

## 2020-09-27 DIAGNOSIS — I10 Essential (primary) hypertension: Secondary | ICD-10-CM | POA: Diagnosis not present

## 2020-09-27 DIAGNOSIS — M549 Dorsalgia, unspecified: Secondary | ICD-10-CM | POA: Insufficient documentation

## 2020-09-27 DIAGNOSIS — Z8542 Personal history of malignant neoplasm of other parts of uterus: Secondary | ICD-10-CM | POA: Insufficient documentation

## 2020-09-27 DIAGNOSIS — M79604 Pain in right leg: Secondary | ICD-10-CM

## 2020-09-27 DIAGNOSIS — M79651 Pain in right thigh: Secondary | ICD-10-CM | POA: Insufficient documentation

## 2020-09-27 HISTORY — DX: Acute embolism and thrombosis of unspecified deep veins of unspecified lower extremity: I82.409

## 2020-09-27 HISTORY — DX: Gastro-esophageal reflux disease without esophagitis: K21.9

## 2020-09-27 HISTORY — DX: Adverse effect of antineoplastic and immunosuppressive drugs, initial encounter: I42.7

## 2020-09-27 HISTORY — DX: Adverse effect of antineoplastic and immunosuppressive drugs, initial encounter: T45.1X5A

## 2020-09-27 HISTORY — DX: Cardiomyopathy due to drug and external agent: T45.1X5A

## 2020-09-27 LAB — COMPREHENSIVE METABOLIC PANEL
ALT: 32 U/L (ref 0–44)
AST: 46 U/L — ABNORMAL HIGH (ref 15–41)
Albumin: 3.3 g/dL — ABNORMAL LOW (ref 3.5–5.0)
Alkaline Phosphatase: 45 U/L (ref 38–126)
Anion gap: 17 — ABNORMAL HIGH (ref 5–15)
BUN: 33 mg/dL — ABNORMAL HIGH (ref 8–23)
CO2: 22 mmol/L (ref 22–32)
Calcium: 10 mg/dL (ref 8.9–10.3)
Chloride: 96 mmol/L — ABNORMAL LOW (ref 98–111)
Creatinine, Ser: 1.61 mg/dL — ABNORMAL HIGH (ref 0.44–1.00)
GFR, Estimated: 36 mL/min — ABNORMAL LOW (ref >60)
Glucose, Bld: 104 mg/dL — ABNORMAL HIGH (ref 70–99)
Potassium: 3.1 mmol/L — ABNORMAL LOW (ref 3.5–5.1)
Sodium: 135 mmol/L (ref 135–145)
Total Bilirubin: 1 mg/dL (ref 0.3–1.2)
Total Protein: 8.7 g/dL — ABNORMAL HIGH (ref 6.5–8.1)

## 2020-09-27 LAB — HEMOGLOBIN A1C
Hgb A1c MFr Bld: 6.2 % — ABNORMAL HIGH (ref 4.8–5.6)
Mean Plasma Glucose: 131.24 mg/dL

## 2020-09-27 LAB — CBC
HCT: 37.1 % (ref 36.0–46.0)
HCT: 40.8 % (ref 36.0–46.0)
Hemoglobin: 12.3 g/dL (ref 12.0–15.0)
Hemoglobin: 12.7 g/dL (ref 12.0–15.0)
MCH: 29.8 pg (ref 26.0–34.0)
MCH: 28.9 pg (ref 26.0–34.0)
MCHC: 33.2 g/dL (ref 30.0–36.0)
MCHC: 31.1 g/dL (ref 30.0–36.0)
MCV: 89.8 fL (ref 80.0–100.0)
MCV: 92.7 fL (ref 80.0–100.0)
Platelets: 161 10*3/uL (ref 150–400)
Platelets: 118 10*3/uL — ABNORMAL LOW (ref 150–400)
RBC: 4.13 MIL/uL (ref 3.87–5.11)
RBC: 4.4 MIL/uL (ref 3.87–5.11)
RDW: 16.3 % — ABNORMAL HIGH (ref 11.5–15.5)
RDW: 16.1 % — ABNORMAL HIGH (ref 11.5–15.5)
WBC: 6.1 10*3/uL (ref 4.0–10.5)
WBC: 6.4 10*3/uL (ref 4.0–10.5)
nRBC: 0 % (ref 0.0–0.2)
nRBC: 0 % (ref 0.0–0.2)

## 2020-09-27 LAB — BASIC METABOLIC PANEL
Anion gap: 14 (ref 5–15)
BUN: 32 mg/dL — ABNORMAL HIGH (ref 8–23)
CO2: 24 mmol/L (ref 22–32)
Calcium: 10 mg/dL (ref 8.9–10.3)
Chloride: 98 mmol/L (ref 98–111)
Creatinine, Ser: 1.59 mg/dL — ABNORMAL HIGH (ref 0.44–1.00)
GFR, Estimated: 37 mL/min — ABNORMAL LOW (ref >60)
Glucose, Bld: 128 mg/dL — ABNORMAL HIGH (ref 70–99)
Potassium: 3 mmol/L — ABNORMAL LOW (ref 3.5–5.1)
Sodium: 136 mmol/L (ref 135–145)

## 2020-09-27 LAB — GLUCOSE, CAPILLARY: Glucose-Capillary: 135 mg/dL — ABNORMAL HIGH (ref 70–99)

## 2020-09-27 LAB — SURGICAL PCR SCREEN
MRSA, PCR: NEGATIVE
Staphylococcus aureus: NEGATIVE

## 2020-09-27 MED ORDER — PREDNISONE 20 MG PO TABS
40.0000 mg | ORAL_TABLET | Freq: Once | ORAL | Status: AC
  Administered 2020-09-27: 40 mg via ORAL
  Filled 2020-09-27: qty 2

## 2020-09-27 MED ORDER — METHYLPREDNISOLONE 4 MG PO TBPK: ORAL_TABLET | ORAL | 0 refills | Status: DC

## 2020-09-27 NOTE — ED Notes (Signed)
Pt transported to MRI 

## 2020-09-27 NOTE — ED Notes (Signed)
Pt transported to xray 
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
## 2020-09-27 NOTE — Progress Notes (Addendum)
PCP - Dr. Cathlean Cower Cardiologist - Dr. Rowland Lathe   Chest x-ray - 06/28/20 EKG - 03/26/20 Stress Test - Years ago ECHO - 05/06/20 Cardiac Cath - Denies  Sleep Study - Many years ago has OSA CPAP - Not using in years  DM - TYPE II CBG at PAT appt 135 Fasting Blood Sugar - 100-115 Checks Blood Sugar not checked it recently  Blood Thinner Instructions: Xarelto patient will reach out to Dr. Lacy Duverney office today  COVID TEST- 09/30/20  Anesthesia review: Yes cardiac history  Patient denies shortness of breath, fever, cough and chest pain at PAT appointment  During appt patient complained of ongoing ankle pain more at night. Stated she almost fell going to a store yesterday.  Wanted to go down to the emergency room before leaving to have it checked out.  Transported to ED by Caryl Pina.    All instructions explained to the patient, with a verbal understanding of the material. Patient agrees to go over the instructions while at home for a better understanding. Patient also instructed to self quarantine after being tested for COVID-19. The opportunity to ask questions was provided.

## 2020-09-27 NOTE — Progress Notes (Deleted)
[id number] [address], [place]  [id number] Phone: [phone] Fax: [phone]                 Your procedure is scheduled on October 02, 2020.             Report to [facility] Main Entrance "A" at 07:00 A.M., and check in at the Admitting office.             Call this number if you have problems the morning of surgery:             [phone]  Call [phone] if you have any questions prior to your surgery date Monday-Friday 8am-4pm              Remember:             Do not eat or drink after midnight the night before your surgery                          Take these medicines the morning of surgery with A SIP OF WATER : Carvedilol (Coreg) Pravastatin (Pravachol) Prednisone (Deltasone)  If needed: Gabapentin (Neurontin) Omeprazole (Prilosec) Oxycodone (Oxy IR/Roxicodone) Tramadol (Ultram) Valacyclovir (Valtrex)  Follow your Doctor's instructions when to STOP your Rivaroxaban (XARELTO).  If no instructions were given, please call your Doctor.  As of today, STOP taking any Aspirin (unless otherwise instructed by your surgeon) Aleve, Naproxen, Ibuprofen, Motrin, Advil, Goody's, BC's, all herbal medications, fish oil, and all vitamins.    HOW TO MANAGE YOUR DIABETES BEFORE AND AFTER SURGERY  Why is it important to control my blood sugar before and after surgery?  Improving blood sugar levels before and after surgery helps healing and can limit problems.  A way of improving blood sugar control is eating a healthy diet by: ?  Eating less sugar and carbohydrates ?  Increasing activity/exercise ?  Talking with your doctor about reaching your blood sugar goals  High blood sugars (greater than 180 mg/dL) can raise your risk of infections and slow your recovery, so you will need to focus on controlling your diabetes during the weeks before surgery.  Make sure that the doctor who takes care of your diabetes  knows about your planned surgery including the date and location.  How do I manage my blood sugar before surgery?  Check your blood sugar at least 4 times a day, starting 2 days before surgery, to make sure that the level is not too high or low.  Check your blood sugar the morning of your surgery when you wake up and every 2 hours until you get to the Short Stay unit. ? If your blood sugar is less than 70 mg/dL, you will need to treat for low blood sugar:  Do not take insulin.  Treat a low blood sugar (less than 70 mg/dL) with  cup of clear juice (cranberry or apple), 4glucose tablets, OR glucose gel.  Recheck blood sugar in 15 minutes after treatment (to make sure it is greater than 70 mg/dL). If your blood sugar is not greater than 70 mg/dL on recheck, call [phone] for further instructions.  Report your blood sugar to the short stay nurse when you get to Short Stay.   If you are admitted to the hospital after surgery: ? Your blood sugar will be checked by the staff and you will probably be given insulin after surgery (instead of oral diabetes  medicines) to make sure you have good blood sugar levels. ? The goal for blood sugar control after surgery is 80-180 mg/dL.   Do not wear jewelry, make up, or nail polish Do notwear lotions, powders, perfumes, or deodorant. Do notshave 48 hours prior to surgery.  Do notbring valuables to the hospital. [facility] is not responsible for any belongings or valuables.  Do NOT Smoke (Tobacco/Vaping) or drink Alcohol 24 hours prior to your procedure If you use a CPAP at night, you may bring all equipment for your overnight stay.  Contacts, glasses, dentures or bridgework may not be worn into surgery.    For patients admitted to the hospital, discharge time will be determined by your treatment team.  Patients discharged the day of surgery will not be allowed to drive  home, and someone needs to stay with them for 24 hours.    Special instructions:   [facility]- Preparing For Surgery  Before surgery, you can play an important role. Because skin is not sterile, your skin needs to be as free of germs as possible. You can reduce the number of germs on your skin by washing with CHG (chlorahexidine gluconate) Soap before surgery.  CHG is an antiseptic cleaner which kills germs and bonds with the skin to continue killing germs even after washing.    Oral Hygiene is also important to reduce your risk of infection.  Remember - BRUSH YOUR TEETH THE MORNING OF SURGERY WITH YOUR REGULAR TOOTHPASTE  Please do not use if you have an allergy to CHG or antibacterial soaps. If your skin becomes reddened/irritated stop using the CHG.  Do not shave (including legs and underarms) for at least 48 hours prior to first CHG shower. It is OK to shave your face.  Please follow these instructions carefully.                                                                                                                               1. Shower the NIGHT BEFORE SURGERY and the MORNING OF SURGERY with CHG Soap.   2. If you chose to wash your hair, wash your hair first as usual with your normal shampoo.  3. After you shampoo, rinse your hair and body thoroughly to remove the shampoo.  4. Use CHG as you would any other liquid soap. You can apply CHG directly to the skin and wash gently with a scrungie or a clean washcloth.   5. Apply the CHG Soap to your body ONLY FROM THE NECK DOWN.  Do not use on open wounds or open sores. Avoid contact with your eyes, ears, mouth and genitals (private parts). Wash Face and genitals (private parts)  with your normal soap.   6. Wash thoroughly, paying special attention to the area where your surgery will be performed.  7. Thoroughly rinse your body with warm water from the neck down.  8. DO NOT shower/wash with your normal soap after  using and rinsing off the CHG Soap.  9. Pat yourself dry with a CLEAN TOWEL.  10. Wear CLEAN PAJAMAS to bed the night before surgery  11. Place CLEAN SHEETS on your bed the night of your first shower and DO NOT SLEEP WITH PETS.   Day of Surgery: Wear Clean/Comfortable clothing the morning of surgery Do notapply any deodorants/lotions.  Remember to brush your teeth WITH YOUR REGULAR TOOTHPASTE.  Please read over the following fact sheets that you were given.

## 2020-09-27 NOTE — ED Triage Notes (Addendum)
Patient arrives to ED with c/o right leg pain x2 weeks. Pt describes the pain as dull and achy. Pain radiates down entire leg. Associated symptoms include numbness/tingling to right foot.
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
## 2020-09-27 NOTE — ED Notes (Signed)
Neuro surgery at bedside.

## 2020-09-27 NOTE — ED Notes (Signed)
Pt d/c home per MD order. Discharge summary reviewed with pt, pt verbalizes understanding. Off unit via WC. No s/s of acute distress noted at discharge.

## 2020-09-27 NOTE — Discharge Instructions (Addendum)
Please continue to use your cane Take medications as prescribed Follow-up with Dr. Christella Noa as scheduled Return to the emergency department you have any increased weakness numbness tingling or pain

## 2020-09-30 ENCOUNTER — Encounter (HOSPITAL_COMMUNITY): Payer: Self-pay

## 2020-09-30 ENCOUNTER — Other Ambulatory Visit (HOSPITAL_COMMUNITY)
Admission: RE | Admit: 2020-09-30 | Discharge: 2020-09-30 | Disposition: A | Discharge status: Home / Self Care | Payer: 59 | Source: Ambulatory Visit | Attending: Neurosurgery | Admitting: Neurosurgery

## 2020-09-30 ENCOUNTER — Telehealth: Payer: Self-pay | Admitting: *Deleted

## 2020-09-30 ENCOUNTER — Other Ambulatory Visit: Payer: 59

## 2020-09-30 DIAGNOSIS — Z20822 Contact with and (suspected) exposure to covid-19: Secondary | ICD-10-CM | POA: Insufficient documentation

## 2020-09-30 DIAGNOSIS — Z01812 Encounter for preprocedural laboratory examination: Secondary | ICD-10-CM | POA: Diagnosis not present

## 2020-09-30 NOTE — Anesthesia Preprocedure Evaluation (Addendum)
Anesthesia Evaluation  Patient identified by MRN, date of birth, ID band Patient awake    Reviewed: Allergy & Precautions, NPO status , Patient's Chart, lab work & pertinent test results  Airway Mallampati: III       Dental  (+) Teeth Intact, Dental Advisory Given   Pulmonary    breath sounds clear to auscultation + decreased breath sounds      Cardiovascular hypertension,  Rhythm:Regular Rate:Normal     Neuro/Psych    GI/Hepatic   Endo/Other  diabetes  Renal/GU      Musculoskeletal   Abdominal   Peds  Hematology   Anesthesia Other Findings   Reproductive/Obstetrics                            Anesthesia Physical Anesthesia Plan  ASA: III  Anesthesia Plan: General   Post-op Pain Management:    Induction: Intravenous  PONV Risk Score and Plan: Ondansetron and Dexamethasone  Airway Management Planned: Oral ETT and Video Laryngoscope Planned  Additional Equipment:   Intra-op Plan:   Post-operative Plan: Extubation in OR  Informed Consent: I have reviewed the patients History and Physical, chart, labs and discussed the procedure including the risks, benefits and alternatives for the proposed anesthesia with the patient or authorized representative who has indicated his/her understanding and acceptance.     Dental advisory given  Plan Discussed with: CRNA and Anesthesiologist  Anesthesia Plan Comments: (PAT note written by Myra Gianotti, PA-C. )       Anesthesia Quick Evaluation
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
## 2020-09-30 NOTE — Telephone Encounter (Signed)
This RN spoke with pt today per pending surgery under Dr Christella Noa on 10/02/2020 and to verify she understood to stop her blood thinner as of today.  She states she took her last dose on Friday 09/27/2020 in anticipation of surgery.  She also inquired about current schedule for chemo on 12/17 - and if she needs to reschedule.  She asked about resources for transportation to the cancer center for appointments post surgery due to she will be on driving restrictions.  This RN informed her likely appointment for chemo will be delayed to allow for post surgical healing.  She is concerned due to severity of numbness she is having.  Transportation resources are available and this RN will forward request once MD gives new treatment dates.  Of note she believes she will be kept overnight on 10/02/2020 post her surgery at [facility].  ( she was asking this RN about above - this RN informed her need to call Dr Lacy Duverney office)  No other needs at present.

## 2020-10-01 LAB — SARS CORONAVIRUS 2 (TAT 6-24 HRS): SARS Coronavirus 2: NEGATIVE

## 2020-10-02 ENCOUNTER — Observation Stay (HOSPITAL_COMMUNITY)
Admission: RE | Admit: 2020-10-02 | Discharge: 2020-10-03 | Disposition: A | Discharge status: Home / Self Care | Payer: 59 | Attending: Neurosurgery | Admitting: Neurosurgery

## 2020-10-02 ENCOUNTER — Other Ambulatory Visit: Payer: Self-pay

## 2020-10-02 ENCOUNTER — Encounter (HOSPITAL_COMMUNITY)
Admission: RE | Disposition: A | Discharge status: Home / Self Care | Payer: Self-pay | Source: Home / Self Care | Attending: Neurosurgery

## 2020-10-02 ENCOUNTER — Encounter (HOSPITAL_COMMUNITY): Payer: Self-pay | Admitting: Neurosurgery

## 2020-10-02 ENCOUNTER — Ambulatory Visit (HOSPITAL_COMMUNITY): Payer: 59 | Admitting: Vascular Surgery

## 2020-10-02 ENCOUNTER — Ambulatory Visit (HOSPITAL_COMMUNITY): Payer: 59

## 2020-10-02 ENCOUNTER — Ambulatory Visit (HOSPITAL_COMMUNITY): Payer: 59 | Admitting: Anesthesiology

## 2020-10-02 DIAGNOSIS — M5003 Cervical disc disorder with myelopathy, cervicothoracic region: Secondary | ICD-10-CM | POA: Diagnosis not present

## 2020-10-02 DIAGNOSIS — M5010 Cervical disc disorder with radiculopathy, unspecified cervical region: Secondary | ICD-10-CM | POA: Diagnosis present

## 2020-10-02 DIAGNOSIS — M5000 Cervical disc disorder with myelopathy, unspecified cervical region: Secondary | ICD-10-CM | POA: Diagnosis present

## 2020-10-02 DIAGNOSIS — Z419 Encounter for procedure for purposes other than remedying health state, unspecified: Secondary | ICD-10-CM

## 2020-10-02 HISTORY — PX: ANTERIOR CERVICAL DECOMP/DISCECTOMY FUSION: SHX1161

## 2020-10-02 LAB — GLUCOSE, CAPILLARY
Glucose-Capillary: 133 mg/dL — ABNORMAL HIGH (ref 70–99)
Glucose-Capillary: 124 mg/dL — ABNORMAL HIGH (ref 70–99)
Glucose-Capillary: 257 mg/dL — ABNORMAL HIGH (ref 70–99)
Glucose-Capillary: 247 mg/dL — ABNORMAL HIGH (ref 70–99)

## 2020-10-02 SURGERY — ANTERIOR CERVICAL DECOMPRESSION/DISCECTOMY FUSION 1 LEVEL
Anesthesia: General

## 2020-10-02 MED ORDER — ZOLPIDEM TARTRATE 5 MG PO TABS: 5.0000 mg | ORAL_TABLET | Freq: Every evening | ORAL | Status: DC | PRN

## 2020-10-02 MED ORDER — ONDANSETRON HCL 4 MG/2ML IJ SOLN
INTRAMUSCULAR | Status: DC | PRN
  Administered 2020-10-02: 4 mg via INTRAVENOUS

## 2020-10-02 MED ORDER — OXYCODONE HCL 5 MG PO TABS
5.0000 mg | ORAL_TABLET | ORAL | Status: DC | PRN
  Administered 2020-10-02 – 2020-10-03 (×8): 5 mg via ORAL
  Filled 2020-10-02 (×8): qty 1

## 2020-10-02 MED ORDER — SENNOSIDES-DOCUSATE SODIUM 8.6-50 MG PO TABS: 1.0000 | ORAL_TABLET | Freq: Every evening | ORAL | Status: DC | PRN

## 2020-10-02 MED ORDER — GABAPENTIN 100 MG PO CAPS: 200.0000 mg | ORAL_CAPSULE | Freq: Every day | ORAL | Status: DC

## 2020-10-02 MED ORDER — THROMBIN 5000 UNITS EX SOLR
CUTANEOUS | Status: DC | PRN
  Administered 2020-10-02: 10000 [IU] via TOPICAL

## 2020-10-02 MED ORDER — CARVEDILOL 6.25 MG PO TABS
6.2500 mg | ORAL_TABLET | Freq: Two times a day (BID) | ORAL | Status: DC
  Administered 2020-10-02 – 2020-10-03 (×2): 6.25 mg via ORAL
  Filled 2020-10-02 (×2): qty 1

## 2020-10-02 MED ORDER — SODIUM CHLORIDE 0.9% FLUSH
3.0000 mL | Freq: Two times a day (BID) | INTRAVENOUS | Status: DC
  Administered 2020-10-02: 3 mL via INTRAVENOUS

## 2020-10-02 MED ORDER — DEXAMETHASONE SODIUM PHOSPHATE 10 MG/ML IJ SOLN
INTRAMUSCULAR | Status: AC
  Filled 2020-10-02: qty 1

## 2020-10-02 MED ORDER — LABETALOL HCL 5 MG/ML IV SOLN
5.0000 mg | Freq: Once | INTRAVENOUS | Status: AC
  Administered 2020-10-02: 5 mg via INTRAVENOUS

## 2020-10-02 MED ORDER — 0.9 % SODIUM CHLORIDE (POUR BTL) OPTIME
TOPICAL | Status: DC | PRN
  Administered 2020-10-02: 1000 mL

## 2020-10-02 MED ORDER — LIDOCAINE 2% (20 MG/ML) 5 ML SYRINGE
INTRAMUSCULAR | Status: DC | PRN
  Administered 2020-10-02: 40 mg via INTRAVENOUS

## 2020-10-02 MED ORDER — PROPOFOL 10 MG/ML IV BOLUS
INTRAVENOUS | Status: DC | PRN
  Administered 2020-10-02: 120 mg via INTRAVENOUS

## 2020-10-02 MED ORDER — CARVEDILOL 3.125 MG PO TABS
ORAL_TABLET | ORAL | Status: AC
  Administered 2020-10-02: 6.25 mg
  Filled 2020-10-02: qty 2

## 2020-10-02 MED ORDER — MIDAZOLAM HCL 5 MG/5ML IJ SOLN
INTRAMUSCULAR | Status: DC | PRN
  Administered 2020-10-02: 2 mg via INTRAVENOUS

## 2020-10-02 MED ORDER — DIAZEPAM 5 MG PO TABS
5.0000 mg | ORAL_TABLET | Freq: Four times a day (QID) | ORAL | Status: DC | PRN
  Administered 2020-10-02 – 2020-10-03 (×3): 5 mg via ORAL
  Filled 2020-10-02 (×3): qty 1

## 2020-10-02 MED ORDER — SODIUM CHLORIDE 0.9% FLUSH: 3.0000 mL | INTRAVENOUS | Status: DC | PRN

## 2020-10-02 MED ORDER — ROCURONIUM BROMIDE 10 MG/ML (PF) SYRINGE
PREFILLED_SYRINGE | INTRAVENOUS | Status: DC | PRN
  Administered 2020-10-02: 70 mg via INTRAVENOUS

## 2020-10-02 MED ORDER — FENTANYL CITRATE (PF) 100 MCG/2ML IJ SOLN
INTRAMUSCULAR | Status: AC
  Administered 2020-10-02: 50 ug via INTRAVENOUS
  Filled 2020-10-02: qty 2

## 2020-10-02 MED ORDER — INSULIN ASPART 100 UNIT/ML ~~LOC~~ SOLN
0.0000 [IU] | Freq: Every day | SUBCUTANEOUS | Status: DC
  Administered 2020-10-02: 2 [IU] via SUBCUTANEOUS

## 2020-10-02 MED ORDER — LACTATED RINGERS IV SOLN: INTRAVENOUS | Status: DC

## 2020-10-02 MED ORDER — DOCUSATE SODIUM 100 MG PO CAPS
100.0000 mg | ORAL_CAPSULE | Freq: Two times a day (BID) | ORAL | Status: DC
  Administered 2020-10-02 – 2020-10-03 (×2): 100 mg via ORAL
  Filled 2020-10-02 (×2): qty 1

## 2020-10-02 MED ORDER — DEXAMETHASONE SODIUM PHOSPHATE 10 MG/ML IJ SOLN
INTRAMUSCULAR | Status: DC | PRN
  Administered 2020-10-02: 10 mg via INTRAVENOUS

## 2020-10-02 MED ORDER — INSULIN ASPART 100 UNIT/ML ~~LOC~~ SOLN
0.0000 [IU] | Freq: Three times a day (TID) | SUBCUTANEOUS | Status: DC
  Administered 2020-10-03 (×2): 2 [IU] via SUBCUTANEOUS

## 2020-10-02 MED ORDER — HYDROCHLOROTHIAZIDE 12.5 MG PO CAPS
12.5000 mg | ORAL_CAPSULE | Freq: Every day | ORAL | Status: DC
  Administered 2020-10-03: 12.5 mg via ORAL
  Filled 2020-10-02: qty 1

## 2020-10-02 MED ORDER — ONDANSETRON HCL 4 MG/2ML IJ SOLN
INTRAMUSCULAR | Status: AC
  Administered 2020-10-02: 4 mg via INTRAVENOUS
  Filled 2020-10-02: qty 2

## 2020-10-02 MED ORDER — CHLORHEXIDINE GLUCONATE CLOTH 2 % EX PADS: 6.0000 | MEDICATED_PAD | Freq: Once | CUTANEOUS | Status: DC

## 2020-10-02 MED ORDER — PHENOL 1.4 % MT LIQD
1.0000 | OROMUCOSAL | Status: DC | PRN
  Administered 2020-10-02: 1 via OROMUCOSAL
  Filled 2020-10-02: qty 177

## 2020-10-02 MED ORDER — FENTANYL CITRATE (PF) 250 MCG/5ML IJ SOLN
INTRAMUSCULAR | Status: DC | PRN
  Administered 2020-10-02 (×2): 50 ug via INTRAVENOUS

## 2020-10-02 MED ORDER — SUGAMMADEX SODIUM 200 MG/2ML IV SOLN
INTRAVENOUS | Status: DC | PRN
  Administered 2020-10-02: 200 mg via INTRAVENOUS

## 2020-10-02 MED ORDER — PROPOFOL 10 MG/ML IV BOLUS
INTRAVENOUS | Status: AC
  Filled 2020-10-02: qty 20

## 2020-10-02 MED ORDER — VITAMIN B-12 100 MCG PO TABS
100.0000 ug | ORAL_TABLET | Freq: Every day | ORAL | Status: DC
  Administered 2020-10-03: 100 ug via ORAL
  Filled 2020-10-02: qty 1

## 2020-10-02 MED ORDER — MIDAZOLAM HCL 2 MG/2ML IJ SOLN
INTRAMUSCULAR | Status: AC
  Filled 2020-10-02: qty 2

## 2020-10-02 MED ORDER — ASCORBIC ACID 500 MG PO TABS: 500.0000 mg | ORAL_TABLET | Freq: Every day | ORAL | Status: DC

## 2020-10-02 MED ORDER — PANTOPRAZOLE SODIUM 40 MG PO TBEC: 40.0000 mg | DELAYED_RELEASE_TABLET | Freq: Every day | ORAL | Status: DC

## 2020-10-02 MED ORDER — BISACODYL 5 MG PO TBEC: 5.0000 mg | DELAYED_RELEASE_TABLET | Freq: Every day | ORAL | Status: DC | PRN

## 2020-10-02 MED ORDER — ONDANSETRON HCL 4 MG/2ML IJ SOLN
INTRAMUSCULAR | Status: AC
  Filled 2020-10-02: qty 2

## 2020-10-02 MED ORDER — THROMBIN 5000 UNITS EX SOLR
CUTANEOUS | Status: AC
  Filled 2020-10-02: qty 10000

## 2020-10-02 MED ORDER — SILVER SULFADIAZINE 1 % EX CREA
1.0000 "application " | TOPICAL_CREAM | Freq: Every day | CUTANEOUS | Status: DC | PRN
  Filled 2020-10-02: qty 85

## 2020-10-02 MED ORDER — PHENYLEPHRINE 40 MCG/ML (10ML) SYRINGE FOR IV PUSH (FOR BLOOD PRESSURE SUPPORT)
PREFILLED_SYRINGE | INTRAVENOUS | Status: DC | PRN
  Administered 2020-10-02 (×3): 80 ug via INTRAVENOUS

## 2020-10-02 MED ORDER — HEMOSTATIC AGENTS (NO CHARGE) OPTIME
TOPICAL | Status: DC | PRN
  Administered 2020-10-02: 1 via TOPICAL

## 2020-10-02 MED ORDER — HYDRALAZINE HCL 20 MG/ML IJ SOLN
INTRAMUSCULAR | Status: AC
  Administered 2020-10-02: 10 mg via INTRAVENOUS
  Filled 2020-10-02: qty 1

## 2020-10-02 MED ORDER — LIDOCAINE-EPINEPHRINE 0.5 %-1:200000 IJ SOLN
INTRAMUSCULAR | Status: DC | PRN
  Administered 2020-10-02: 4 mL

## 2020-10-02 MED ORDER — ORAL CARE MOUTH RINSE: 15.0000 mL | Freq: Once | OROMUCOSAL | Status: AC

## 2020-10-02 MED ORDER — LABETALOL HCL 5 MG/ML IV SOLN: 5.0000 mg | Freq: Once | INTRAVENOUS | Status: AC

## 2020-10-02 MED ORDER — CEFAZOLIN SODIUM-DEXTROSE 2-4 GM/100ML-% IV SOLN: 2.0000 g | INTRAVENOUS | Status: DC

## 2020-10-02 MED ORDER — HYDRALAZINE HCL 20 MG/ML IJ SOLN: 10.0000 mg | Freq: Once | INTRAMUSCULAR | Status: AC

## 2020-10-02 MED ORDER — VALACYCLOVIR HCL 500 MG PO TABS: 500.0000 mg | ORAL_TABLET | Freq: Two times a day (BID) | ORAL | Status: DC

## 2020-10-02 MED ORDER — POTASSIUM CHLORIDE IN NACL 20-0.9 MEQ/L-% IV SOLN: INTRAVENOUS | Status: DC

## 2020-10-02 MED ORDER — MAGNESIUM CITRATE PO SOLN: 1.0000 | Freq: Once | ORAL | Status: DC | PRN

## 2020-10-02 MED ORDER — ONDANSETRON HCL 4 MG/2ML IJ SOLN: 4.0000 mg | Freq: Four times a day (QID) | INTRAMUSCULAR | Status: DC | PRN

## 2020-10-02 MED ORDER — CELECOXIB 200 MG PO CAPS
200.0000 mg | ORAL_CAPSULE | Freq: Two times a day (BID) | ORAL | Status: DC
  Administered 2020-10-02 – 2020-10-03 (×3): 200 mg via ORAL
  Filled 2020-10-02 (×3): qty 1

## 2020-10-02 MED ORDER — LIDOCAINE-EPINEPHRINE 0.5 %-1:200000 IJ SOLN
INTRAMUSCULAR | Status: AC
  Filled 2020-10-02: qty 1

## 2020-10-02 MED ORDER — ONDANSETRON HCL 4 MG/2ML IJ SOLN: 4.0000 mg | Freq: Once | INTRAMUSCULAR | Status: AC | PRN

## 2020-10-02 MED ORDER — FENTANYL CITRATE (PF) 250 MCG/5ML IJ SOLN
INTRAMUSCULAR | Status: AC
  Filled 2020-10-02: qty 5

## 2020-10-02 MED ORDER — MENTHOL 3 MG MT LOZG: 1.0000 | LOZENGE | OROMUCOSAL | Status: DC | PRN

## 2020-10-02 MED ORDER — CARVEDILOL 6.25 MG PO TABS
6.2500 mg | ORAL_TABLET | Freq: Once | ORAL | Status: AC
  Filled 2020-10-02: qty 1

## 2020-10-02 MED ORDER — TELMISARTAN-HCTZ 80-12.5 MG PO TABS: 1.0000 | ORAL_TABLET | Freq: Every day | ORAL | Status: DC

## 2020-10-02 MED ORDER — CHLORHEXIDINE GLUCONATE 0.12 % MT SOLN
15.0000 mL | Freq: Once | OROMUCOSAL | Status: AC
  Administered 2020-10-02: 15 mL via OROMUCOSAL
  Filled 2020-10-02: qty 15

## 2020-10-02 MED ORDER — LIDOCAINE 2% (20 MG/ML) 5 ML SYRINGE
INTRAMUSCULAR | Status: AC
  Filled 2020-10-02: qty 5

## 2020-10-02 MED ORDER — FERROUS SULFATE 325 (65 FE) MG PO TABS
325.0000 mg | ORAL_TABLET | Freq: Every day | ORAL | Status: DC
  Administered 2020-10-03: 325 mg via ORAL
  Filled 2020-10-02: qty 1

## 2020-10-02 MED ORDER — ACETAMINOPHEN 650 MG RE SUPP: 650.0000 mg | RECTAL | Status: DC | PRN

## 2020-10-02 MED ORDER — FUROSEMIDE 40 MG PO TABS
40.0000 mg | ORAL_TABLET | Freq: Every day | ORAL | Status: DC
Start: 2020-10-02 — End: 2020-10-02

## 2020-10-02 MED ORDER — FENTANYL CITRATE (PF) 100 MCG/2ML IJ SOLN
25.0000 ug | INTRAMUSCULAR | Status: DC | PRN
  Administered 2020-10-02: 50 ug via INTRAVENOUS

## 2020-10-02 MED ORDER — ROCURONIUM BROMIDE 10 MG/ML (PF) SYRINGE
PREFILLED_SYRINGE | INTRAVENOUS | Status: AC
  Filled 2020-10-02: qty 10

## 2020-10-02 MED ORDER — SODIUM CHLORIDE 0.9 % IV SOLN: 250.0000 mL | INTRAVENOUS | Status: DC

## 2020-10-02 MED ORDER — POTASSIUM CHLORIDE CRYS ER 20 MEQ PO TBCR
20.0000 meq | EXTENDED_RELEASE_TABLET | Freq: Every day | ORAL | Status: DC
  Administered 2020-10-02 – 2020-10-03 (×2): 20 meq via ORAL
  Filled 2020-10-02 (×2): qty 1

## 2020-10-02 MED ORDER — PRAVASTATIN SODIUM 40 MG PO TABS
40.0000 mg | ORAL_TABLET | Freq: Every day | ORAL | Status: DC
  Administered 2020-10-02: 40 mg via ORAL
  Filled 2020-10-02: qty 1

## 2020-10-02 MED ORDER — ONDANSETRON HCL 4 MG PO TABS: 4.0000 mg | ORAL_TABLET | Freq: Four times a day (QID) | ORAL | Status: DC | PRN

## 2020-10-02 MED ORDER — ACETAMINOPHEN 325 MG PO TABS
650.0000 mg | ORAL_TABLET | ORAL | Status: DC | PRN
  Administered 2020-10-03 (×2): 650 mg via ORAL
  Filled 2020-10-02 (×2): qty 2

## 2020-10-02 MED ORDER — IRBESARTAN 150 MG PO TABS
300.0000 mg | ORAL_TABLET | Freq: Every day | ORAL | Status: DC
  Administered 2020-10-03: 300 mg via ORAL
  Filled 2020-10-02: qty 2

## 2020-10-02 MED ORDER — LABETALOL HCL 5 MG/ML IV SOLN
INTRAVENOUS | Status: AC
  Administered 2020-10-02: 5 mg via INTRAVENOUS
  Filled 2020-10-02: qty 4

## 2020-10-02 SURGICAL SUPPLY — 52 items
ADH SKN CLS APL DERMABOND .7 (GAUZE/BANDAGES/DRESSINGS) ×1
BAND INSRT 18 STRL LF DISP RB (MISCELLANEOUS) ×2
BAND RUBBER #18 3X1/16 STRL (MISCELLANEOUS) ×4 IMPLANT
BLADE CLIPPER SURG (BLADE) IMPLANT
BUR DRUM 4.0 (BURR) ×2 IMPLANT
BUR MATCHSTICK NEURO 3.0 LAGG (BURR) ×2 IMPLANT
CANISTER SUCT 3000ML PPV (MISCELLANEOUS) ×2 IMPLANT
CARTRIDGE OIL MAESTRO DRILL (MISCELLANEOUS) ×1 IMPLANT
COVER WAND RF STERILE (DRAPES) ×2 IMPLANT
DECANTER SPIKE VIAL GLASS SM (MISCELLANEOUS) ×2 IMPLANT
DERMABOND ADVANCED (GAUZE/BANDAGES/DRESSINGS) ×1
DERMABOND ADVANCED .7 DNX12 (GAUZE/BANDAGES/DRESSINGS) ×1 IMPLANT
DIFFUSER DRILL AIR PNEUMATIC (MISCELLANEOUS) ×2 IMPLANT
DRAPE HALF SHEET 40X57 (DRAPES) IMPLANT
DRAPE LAPAROTOMY 100X72 PEDS (DRAPES) ×2 IMPLANT
DRAPE MICROSCOPE LEICA (MISCELLANEOUS) ×2 IMPLANT
DURAPREP 6ML APPLICATOR 50/CS (WOUND CARE) ×2 IMPLANT
ELECT COATED BLADE 2.86 ST (ELECTRODE) ×2 IMPLANT
ELECT REM PT RETURN 9FT ADLT (ELECTROSURGICAL) ×2
ELECTRODE REM PT RTRN 9FT ADLT (ELECTROSURGICAL) ×1 IMPLANT
GAUZE 4X4 16PLY RFD (DISPOSABLE) IMPLANT
GLOVE ECLIPSE 6.5 STRL STRAW (GLOVE) ×2 IMPLANT
GLOVE EXAM NITRILE XL STR (GLOVE) IMPLANT
GOWN STRL REUS W/ TWL LRG LVL3 (GOWN DISPOSABLE) ×2 IMPLANT
GOWN STRL REUS W/ TWL XL LVL3 (GOWN DISPOSABLE) IMPLANT
GOWN STRL REUS W/TWL 2XL LVL3 (GOWN DISPOSABLE) IMPLANT
GOWN STRL REUS W/TWL LRG LVL3 (GOWN DISPOSABLE) ×4
GOWN STRL REUS W/TWL XL LVL3 (GOWN DISPOSABLE)
GRAFT DURAGEN MATRIX 1WX1L (Tissue) ×1 IMPLANT
KIT BASIN OR (CUSTOM PROCEDURE TRAY) ×2 IMPLANT
KIT TURNOVER KIT B (KITS) ×2 IMPLANT
NDL HYPO 25X1 1.5 SAFETY (NEEDLE) ×1 IMPLANT
NDL SPNL 22GX3.5 QUINCKE BK (NEEDLE) ×1 IMPLANT
NEEDLE HYPO 25X1 1.5 SAFETY (NEEDLE) ×2 IMPLANT
NEEDLE SPNL 22GX3.5 QUINCKE BK (NEEDLE) ×4 IMPLANT
NS IRRIG 1000ML POUR BTL (IV SOLUTION) ×2 IMPLANT
OIL CARTRIDGE MAESTRO DRILL (MISCELLANEOUS) ×2
PACK LAMINECTOMY NEURO (CUSTOM PROCEDURE TRAY) ×2 IMPLANT
PAD ARMBOARD 7.5X6 YLW CONV (MISCELLANEOUS) ×6 IMPLANT
PIN DISTRACTION 14MM (PIN) IMPLANT
PLATE ACP 1-LEVEL1.6V22 (Plate) ×1 IMPLANT
SCREW ACP ST VARI 3.5X13 (Screw) ×4 IMPLANT
SCREW ACP VA ST 4X13 (Screw) ×1 IMPLANT
SPACER ACF PARALLEL 7MM (Bone Implant) ×1 IMPLANT
SPONGE INTESTINAL PEANUT (DISPOSABLE) ×2 IMPLANT
SPONGE SURGIFOAM ABS GEL SZ50 (HEMOSTASIS) ×2 IMPLANT
SUT VIC AB 0 CT1 27 (SUTURE)
SUT VIC AB 0 CT1 27XBRD ANTBC (SUTURE) IMPLANT
SUT VIC AB 3-0 SH 8-18 (SUTURE) ×3 IMPLANT
TOWEL GREEN STERILE (TOWEL DISPOSABLE) ×2 IMPLANT
TOWEL GREEN STERILE FF (TOWEL DISPOSABLE) ×2 IMPLANT
WATER STERILE IRR 1000ML POUR (IV SOLUTION) ×2 IMPLANT

## 2020-10-02 NOTE — OR Nursing (Signed)
Pt is awake,alert and oriented.Pt and/or family verbalized understanding of poc and discharge instructions. Reviewed admission and on going care with receiving RN. Pt is in NAD at this time and is ready to be transferred to floor. Will con't to monitor until pt is transferred. Belongings on bed with patient Pt placed in wheelchair with assist. Pt does walk with walker and floor nurse made aware Pt blood pressure is WNL for patient

## 2020-10-02 NOTE — Progress Notes (Signed)
CSW received request for assistance with transportation. CSW met with patient and friend from [place] at bedside and provided resources for [facility]. CSW will email [employer] [place] application once therapy evaluations are completed.  Hughes Wyndham LCSW

## 2020-10-02 NOTE — H&P (Addendum)
BP (!) 163/75   Pulse 84   Temp 98.7 F (37.1 C) (Oral)   Resp 18   Ht 4\' 11"  (1.499 m)   Wt 89.8 kg   LMP 05/18/2016   SpO2 100%   BMI 39.99 kg/m  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  She was seen by Dr. Melina Schools who wanted a 2nd opinion about the surgery.  She does have significant wasting of both hands, right side being worse than the left with profound numbness in the left hand which is worse than the right.  She has significant thenar and hypothenar wasting, intrinsic muscle wasting, abductors and adductors wasting in the right hand.  She has wasting in her palms.  She cannot extend her fingers bilaterally on either side.  She has weakness in her grip.  Her biceps and triceps are stronger.  The deltoid is also fairly strong.  She does have proprioception in the feet.  She has very poor proprioception in her left hand.  She has no clonus.  She is 3+ at the knees.  No reflex elicited at the ankles.  It is noted she has had Achilles tendon surgery bilaterally, also.  She has no Hoffmann sign.  She is unable to make a fist with either hand.  She does not have a Tinel sign over the cubital tunnel, either.     PHYSICAL EXAMINATION :  Pupils are equal, round, and reactive to light.  She has symmetric facies, symmetric facial movements.  Hearing is intact to voice bilaterally.  She weighs 198 pounds.  Temperature is 97.3, blood pressure is 112/74, pulse 79.  Pain is 8/10.     IMAGING :  MRI shows a large disc herniation at C4-5, which is causing some cord compression.  However, there is no abnormal cord signal there.  Otherwise, she has some spondylitic change present in the cervical spine, but nothing that truly explains what is going on in the hands.  She does have a history of breast cancer.  This was a recurrence, unfortunately, of breast cancer and she has received both radiation and chemo for treatment of the cancer.  In her  words, she has a feeling of being frostbitten in both hands.  She has pain in the left shoulder.  She has little or no dexterity.  She cannot grip.  She cannot fully extend.  She has an ulnar claw hand bilaterally.  She has shooting pains in her hands, right lower extremity weakness, and right foot numbness.  The numbness is confined to the foot.  The rest of the leg and the thigh are normal.she has a foot drop on the right side which developed on Friday after a fall.      ASSESSMENT AND PLAN :  I believe [name] does need surgery.  I don't believe that this is the only cause of the wasting in both hands.  I think there is another process that is going on.  [name] states that she has undergone an EMG/nerve conduction study in [place] office.  However, the results are not available to me for review today.  But, I strongly encouraged her to undergo the anterior cervical decompression and arthrodesis because as long as that disc is there, we will never know what contribution is coming from that with regard to her hands and upper extremities and/or some other entity.Mri Friday did not show an etiology for her  acute foot drop. This along with the findings in the hands certainly

## 2020-10-02 NOTE — Anesthesia Procedure Notes (Signed)
Procedure Name: Intubation Date/Time: 10/02/2020 9:49 AM Performed by: Myna Bright, CRNA Pre-anesthesia Checklist: Patient identified, Emergency Drugs available, Patient being monitored and Suction available Patient Re-evaluated:Patient Re-evaluated prior to induction Oxygen Delivery Method: Circle system utilized Preoxygenation: Pre-oxygenation with 100% oxygen Induction Type: IV induction Ventilation: Mask ventilation without difficulty Laryngoscope Size: 3 and Glidescope Tube type: Oral Tube size: 7.0 mm Number of attempts: 1 Airway Equipment and Method: Stylet and Video-laryngoscopy Placement Confirmation: ETT inserted through vocal cords under direct vision,  positive ETCO2 and breath sounds checked- equal and bilateral Secured at: 21 cm Tube secured with: Tape Dental Injury: Teeth and Oropharynx as per pre-operative assessment  Comments: Glidescope used to maintain neutral C-spine. Good view, AOI.

## 2020-10-02 NOTE — Transfer of Care (Signed)
Immediate Anesthesia Transfer of Care Note  Patient: [name]  Procedure(s) Performed: Anterior Cervical Discectomy Fusion Cervical Four-Five (N/A )  Patient Location: PACU  Anesthesia Type:General  Level of Consciousness: awake, alert , oriented and patient cooperative  Airway & Oxygen Therapy: Patient Spontanous Breathing and Patient connected to face mask oxygen  Post-op Assessment: Report given to RN, Post -op Vital signs reviewed and stable and Patient moving all extremities  Post vital signs: Reviewed and stable  Last Vitals:  Vitals Value Taken Time  BP    Temp    Pulse    Resp    SpO2      Last Pain:  Vitals:   10/02/20 0759  TempSrc:   PainSc: 3       Patients Stated Pain Goal: 3 (88/73/73 0816)  Complications: No complications documented.

## 2020-10-02 NOTE — Op Note (Signed)
10/02/2020  6:46 PM  PATIENT:  [name]  64 y.o. 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/5 causing spinal cord compression PRE-OPERATIVE DIAGNOSIS:  HERNIATED NUCLEUS PULPOSUS, CERVICAL 4/5  POST-OPERATIVE DIAGNOSIS:  HERNIATED NUCLEUS PULPOSUS, CERVICAL 4/5  PROCEDURE:  Anterior Cervical decompression C4/5 Arthrodesis C4/5 with 35mm structural allograft Anterior instrumentation(nuvasive acp plate) C4/5  SURGEON:   Surgeon(s): Ashok Pall, MD Kary Kos, MD   ASSISTANTS:Cram, Dominica Severin  ANESTHESIA:   general  EBL:  Total I/O In: 0  Out: 300 [Urine:200; Blood:100]  BLOOD ADMINISTERED:none  CELL SAVER GIVEN:none  COUNT:per nursing  DRAINS: none   SPECIMEN:  No Specimen  DICTATION: Mrs. [name] was taken to the operating room, intubated, and placed under general anesthesia without difficulty. She was positioned supine with her head in slight extension on a horseshoe headrest. The neck was prepped and draped in a sterile manner. I infiltrated 5 cc's 1/2%lidocaine/1:200,000 strength epinephrine into the planned incision starting from the midline to the medial border of the left sternocleidomastoid muscle. I opened the incision with a 10 blade and dissected sharply through soft tissue to the platysma. I dissected in the plane superior to the platysma both rostrally and caudally. I then opened the platysma in a horizontal fashion with Metzenbaum scissors, and dissected in the inferior plane rostrally and caudally. With both blunt and sharp technique I created an avascular corridor to the cervical spine. I placed a spinal needle(s) in the disc space at C4/5 . I then reflected the longus colli from C4 to C5 and placed self retaining retractors. I opened the disc space(s) at C4/5 with a 15 blade. I removed disc with curettes, Kerrison punches, and the drill. Using the drill I removed osteophytes and prepared for the decompression.  I decompressed the spinal canal and the C5  root(s) with the drill, Kerrison punches, and the curettes. I used the microscope to aid in microdissection. I removed the posterior longitudinal ligament to fully expose and decompress the thecal sac. The thecal sac and the posterior longitudinal ligament were essentially fused at the deepest aspect of the osteophyte disc complex. In the course of removing the osteophyte the dura was opened and the arachonoid exposed. We placed duragen over the exposed arachnoid.  I exposed the roots laterally taking down the C4/5 uncovertebral joints. With the decompression complete we moved on to the arthrodesis. We  used the drill to level the surfaces of C4, and 5. I removed soft tissue to prepare the disc space and the bony surfaces. I measured the space and placed a 69mm structural allograft into the disc space.  We  then placed the anterior instrumentation. I placed 2 screws in each vertebral body through the plate. I locked the screws into place. Intraoperative xray showed the graft, plate, and screws to be in good position. I irrigated the wound, achieved hemostasis, and closed the wound in layers. I approximated the platysma, and the subcuticular plane with vicryl sutures. I used Dermabond for a sterile dressing.   PLAN OF CARE: Admit for overnight observation  PATIENT DISPOSITION:  PACU - hemodynamically stable.   Delay start of Pharmacological VTE agent (>24hrs) due to surgical blood loss or risk of bleeding:  yes

## 2020-10-02 NOTE — Anesthesia Postprocedure Evaluation (Signed)
Anesthesia Post Note  Patient: [name]  Procedure(s) Performed: Anterior Cervical Discectomy Fusion Cervical Four-Five (N/A )     Patient location during evaluation: PACU Anesthesia Type: General Level of consciousness: awake and alert Pain management: pain level controlled Vital Signs Assessment: post-procedure vital signs reviewed and stable Respiratory status: spontaneous breathing, nonlabored ventilation, respiratory function stable and patient connected to nasal cannula oxygen Cardiovascular status: blood pressure returned to baseline and stable Postop Assessment: no apparent nausea or vomiting Anesthetic complications: no   No complications documented.  Last Vitals:  Vitals:   10/02/20 1413 10/02/20 1436  BP: (!) 153/86 116/67  Pulse: 72 80  Resp: 16 18  Temp:  36.9 C  SpO2: 100% 100%    Last Pain:  Vitals:   10/02/20 1540  TempSrc:   PainSc: 8                  Saira Kramme COKER

## 2020-10-03 ENCOUNTER — Encounter (HOSPITAL_COMMUNITY): Payer: 59 | Admitting: Cardiology

## 2020-10-03 ENCOUNTER — Encounter (HOSPITAL_COMMUNITY): Payer: Self-pay | Admitting: Neurosurgery

## 2020-10-03 ENCOUNTER — Ambulatory Visit (HOSPITAL_COMMUNITY): Payer: 59

## 2020-10-03 DIAGNOSIS — M5003 Cervical disc disorder with myelopathy, cervicothoracic region: Secondary | ICD-10-CM | POA: Diagnosis not present

## 2020-10-03 LAB — GLUCOSE, CAPILLARY
Glucose-Capillary: 127 mg/dL — ABNORMAL HIGH (ref 70–99)
Glucose-Capillary: 149 mg/dL — ABNORMAL HIGH (ref 70–99)

## 2020-10-03 MED ORDER — OXYCODONE-ACETAMINOPHEN 7.5-325 MG PO TABS: 1.0000 | ORAL_TABLET | Freq: Four times a day (QID) | ORAL | 0 refills | Status: AC | PRN

## 2020-10-03 MED ORDER — CHLORHEXIDINE GLUCONATE CLOTH 2 % EX PADS
6.0000 | MEDICATED_PAD | Freq: Every day | CUTANEOUS | Status: DC
  Administered 2020-10-03: 6 via TOPICAL

## 2020-10-03 MED ORDER — TIZANIDINE HCL 4 MG PO TABS: 4.0000 mg | ORAL_TABLET | Freq: Four times a day (QID) | ORAL | 0 refills | Status: DC | PRN

## 2020-10-03 NOTE — Progress Notes (Signed)
Patient is discharged from room 3C08 at this time. Alert and in stable condition. IV site d/c'd and instructions read to patient and cousin with understanding verbalized and all questions answered. Left unit via wheelchair with all belongings at side.

## 2020-10-03 NOTE — TOC Initial Note (Addendum)
Transition of Care [facility]) - Initial/Assessment Note    Patient Details  Name: [name] MRN: [id number] Date of Birth: [birth year]  [facility]) CM/SW Contact:    Angelita Ingles, RN Phone Number: [phone]  10/03/2020, 1:20 PM  Clinical Narrative:                 [facility] consulted for patient requesting various services. CM at bedside and patient states she was given resources on 12/15 for transportation services. Patient states that she was going to discuss with her MD because she can not stretch her finger all the way out which prevents her from being able to drive comfortably. Patient is also requesting home health because she needs someone to help her with light housework and household chores. This CM has explained to patient that insurance most likely will not pay for home health for those purposes but if there is true medical need CM may be able to set up [facility] services. Patient states that she would be appreciative of any help that she could get in order to help her get better. CM will continue to follow for discharge planning.   109 CM has contacted several [place] agencies but none are able to provide [facility] services for patient with [facility] commercial insurance. The following agencies have denied services [facility]-No [facility]- No, [facility]- pending, [facility]- No, [facility]- no, and [facility]- No. CM attempted to call patient to make her aware but patient does not answer the phone. Message left for patient to return call to CM.   Expected Discharge Plan: Home/Self Care Barriers to Discharge: Continued Medical Work up   Patient Goals and CMS Choice Patient states their goals for this hospitalization and ongoing recovery are:: Patient wants to get better so that she can care for herself CMS Medicare.gov Compare Post Acute Care list provided to:: Patient Choice offered to / list presented to : Patient  Expected Discharge Plan and Services Expected Discharge Plan:  Home/Self Care In-house Referral: Clinical Social [facility] Discharge Planning Services: CM Consult   Living arrangements for the past 2 months: Single Family Home                 DME Arranged: N/A DME Agency: NA       HH Arranged: NA          Prior Living Arrangements/Services Living arrangements for the past 2 months: Single Family Home Lives with:: Self Patient language and need for interpreter reviewed:: Yes Do you feel safe going back to the place where you live?: Yes      Need for Family Participation in Patient Care: Yes (Comment)     Criminal Activity/Legal Involvement Pertinent to Current Situation/Hospitalization: No - Comment as needed  Activities of Daily Living Home Assistive Devices/Equipment: Cane (specify quad or straight),Walker (specify type),Shower chair without back,Grab bars in shower ADL Screening (condition at time of admission) Patient's cognitive ability adequate to safely complete daily activities?: Yes Is the patient deaf or have difficulty hearing?: No Does the patient have difficulty seeing, even when wearing glasses/contacts?: No Does the patient have difficulty concentrating, remembering, or making decisions?: No Patient able to express need for assistance with ADLs?: Yes Does the patient have difficulty dressing or bathing?: Yes Independently performs ADLs?: No Communication: Independent Dressing (OT): Needs assistance Is this a change from baseline?: Change from baseline, expected to last <3days Grooming: Needs assistance Is this a change from baseline?: Change from baseline, expected to last <3  days Feeding: Independent Bathing: Needs assistance Is this a change from baseline?: Change from baseline, expected to last <3 days Toileting: Needs assistance Is this a change from baseline?: Change from baseline, expected to last <3 days In/Out Bed: Needs assistance Is this a change from baseline?: Change from baseline, expected to last <3  days Does the patient have difficulty walking or climbing stairs?: Yes Weakness of Legs: Both Weakness of Arms/Hands: Both  Permission Sought/Granted Permission sought to share information with : Family Supports Permission granted to share information with : No              Emotional Assessment Appearance:: Appears stated age Attitude/Demeanor/Rapport: Gracious Affect (typically observed): Pleasant Orientation: : Oriented to Self,Oriented to Place,Oriented to  Time,Oriented to Situation Alcohol / Substance Use: Not Applicable Psych Involvement: No (comment)  Admission diagnosis:  HNP (herniated nucleus pulposus) with myelopathy, cervical [M50.00] Patient Active Problem List   Diagnosis Date Noted  . HNP (herniated nucleus pulposus) with myelopathy, cervical 10/02/2020  . Bilateral carpal tunnel syndrome 08/08/2020  . 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 Numbness and tingling in right hand 07/04/2020  . Right hand weakness 07/04/2020  . Cough 06/30/2020  . Dysphagia 04/30/2020  . Port-A-Cath in place 11/28/2019  . Hepatic steatosis 11/21/2019  . Aortic atherosclerosis ([place]) 11/21/2019  . 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 in female, estrogen receptor negative ([place]) 11/03/2019  . Venous stasis ulcer of right ankle limited to breakdown of skin without varicose veins ([place]) 10/30/2019  . Wound infection 10/30/2019  . Goals of care, counseling/discussion 06/15/2019  . Family history of breast cancer   . Headache 06/06/2019  . 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 Vitamin D deficiency 04/29/2019  . Encounter for well adult exam with abnormal findings 04/28/2019  . Blood in urine 06/10/2018  . Herpes simplex type 2 infection 06/10/2018  . Incomplete emptying of bladder 06/10/2018  . Increased frequency of urination 06/10/2018  . Sore throat 05/16/2018  . HTN (hypertension)  10/01/2017  . Peripheral edema 10/01/2017  . Recurrent cold sores 10/01/2017  . Genital herpes 10/01/2017  . Bunion, right foot 06/29/2017  . Type 2 diabetes mellitus without complication, without long-term current use of insulin ([place]) 06/29/2017  . Idiopathic chronic venous hypertension of both lower extremities with inflammation 04/12/2017  . Acquired contracture of Achilles tendon, right 04/12/2017  . Acute hearing loss, right 03/16/2017  . Posterior tibial tendinitis, right leg 12/28/2016  . Achilles tendon contracture, right 12/28/2016  . Diabetic polyneuropathy associated with type 2 diabetes mellitus ([place]) 11/30/2016  . Peroneal tendinitis, right leg 10/30/2016  . Fatigue 09/04/2016  . 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 Subcutaneous mass 07/08/2016  . Seroma, postoperative 06/25/2016  . Endometrial cancer ([place]) 06/11/2016  . Umbilical hernia without obstruction and without gangrene 06/11/2016  . Abnormal uterine bleeding 06/02/2016  . Abnormal perimenopausal bleeding 06/02/2016  . Contusion of breast, right 02/16/2016  . Costal margin pain 02/16/2016  . Right leg pain 02/16/2016  . Abdominal pain, epigastric 01/30/2016  . Candida infection of genital region 03/04/2015  . Proptosis 10/31/2014  . Blurred vision, right eye 10/31/2014  . BMI 45.0-49.9, adult ([place]) 10/01/2014  . Arthritis pain of hip 10/01/2014  . Pain, lower extremity 07/19/2013  . Pain in joint, lower leg 06/26/2013  . Abdominal tenderness 02/08/2013  . Diabetes ([place]) 11/09/2012  . Rash 11/09/2012  . Lateral ventral hernia  10/14/2012  . Hidradenitis 10/14/2012  . Pulmonary nodule seen on imaging study 10/14/2012  . Left knee pain 03/31/2012  . Left wrist pain 03/31/2012  . Anxiety and depression 03/31/2012  . Diarrhea 02/22/2012  . Left hip pain 02/17/2012  . Morbid obesity ([place]) 02/17/2012  . Fibroid, uterine 12/08/2011    Class: History of  . Pelvic pain 11/17/2011    Class:  History of  . Back pain 11/17/2011    Class: History of  . Eczema 10/27/2011  . Cancer of central portion of left female breast ([place]) 06/12/2011  . Fibromyalgia 02/13/2011  . Preventative health care 02/08/2011  . Menorrhagia 12/25/2009    Class: History of  . GENITAL HERPES, HX OF 08/08/2009  . 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 Hyperlipidemia 05/11/2008  . Iron deficiency anemia 05/11/2008  . Obstructive sleep apnea 05/11/2008  . GERD 05/11/2008  . COLONIC POLYPS, HX OF 05/11/2008   PCP:  Biagio Borg, MD Pharmacy:   [id number] - [place], [place] [address] Phone: [phone] Fax: [phone]     Social Determinants of Health (SDOH) Interventions    Readmission Risk Interventions No flowsheet data found.

## 2020-10-03 NOTE — Evaluation (Signed)
Occupational Therapy Evaluation Patient Details Name: [name] MRN: [id number] DOB: [birth year] Today's Date: 10/03/2020    History of Present Illness Patient is a 64 year old female admitted 12/15 with large disc herniation at C4/5 causing spinal cord compression s/p Anterior Cervical decompression C4/5. PMH includes breast CA s/p chemo and radiation, Endometrial cancer, polyneuropathy   Clinical Impression   Patient lives alone in single level home with 2 STE. Patient reports recent fall after getting up her steps at home using quad cane, also has rollator and walker which she uses inside the house. For ~1 month patient has had increased numbness/tingling, decreased grip and coordination in B hands starting in R hand and progressing to L. Patient reports increased difficulty with ADL/IADLs such as washing her dishes, getting dressed. OT educate patient in compensatory strategies and provided patient with red foam handles for utensils, shelf liner to use as jar grip and yellow theraputty with instructions for grip and FMC exercises. Patient declined getting dressed at time of eval but was able to pull up sock, ambulate to bathroom with RW to perform g/h at min G level. Recommend continued acute OT services to maximize patient safety and independence with self care in order to facilitate D/C to venue listed below.    Follow Up Recommendations  Follow surgeon's recommendation for DC plan and follow-up therapies    Equipment Recommendations  None recommended by OT       Precautions / Restrictions Precautions Precautions: Cervical Precaution Comments: no brace needed Restrictions Weight Bearing Restrictions: No      Mobility Bed Mobility Overal bed mobility: Modified Independent             General bed mobility comments: HOB elevated, increased time and use of bed rails    Transfers Overall transfer level: Needs assistance Equipment used: Rolling walker (2  wheeled) Transfers: Sit to/from Stand Sit to Stand: Min guard         General transfer comment: please see toilet transfer in ADL section    Balance Overall balance assessment: Needs assistance Sitting-balance support: Feet supported Sitting balance-Leahy Scale: Fair     Standing balance support: No upper extremity supported;Bilateral upper extremity supported;During functional activity Standing balance-Leahy Scale: Poor Standing balance comment: patient leans against sink for stability when brushing teeth                           ADL either performed or assessed with clinical judgement   ADL Overall ADL's : Needs assistance/impaired Eating/Feeding: Set up;Bed level Eating/Feeding Details (indicate cue type and reason): requires assistance with opening containers, provided patient with built up red foam handles for utensils Grooming: Oral care;Wash/dry face;Supervision/safety;Standing Grooming Details (indicate cue type and reason): patient requires increased time to open tooth paste container/set up of oral care items Upper Body Bathing: Supervision/ safety;Set up;Sitting;Standing   Lower Body Bathing: Min guard;Sit to/from stand;Sitting/lateral leans   Upper Body Dressing : Supervision/safety;Set up;Sitting Upper Body Dressing Details (indicate cue type and reason): patient reports she no longer unfastens her bras, dons like a shirt Lower Body Dressing: Min guard;Sit to/from stand;Sitting/lateral leans Lower Body Dressing Details (indicate cue type and reason): patient demonstrates ability to pull up sock with leg supported on EOB, patient states it takes her much longer to get dressed at home "I have to sit in different chairs, the edge of my bed" educate patient on use of reacher to assist with LB dressing, pt verbalize understanding  Toilet Transfer: Min guard;Ambulation;RW;Regular Glass blower/designer Details (indicate cue type and reason): simulated with  functional ambulation to bathroom and tranfer off/on EOB, patient min G for safety due to mild unsteadiness, increased time to power up to standing from EOB Toileting- Clothing Manipulation and Hygiene: Min guard;Sitting/lateral lean;Sit to/from stand       Functional mobility during ADLs: Min guard;Rolling walker General ADL Comments: provided patient with foam handles for utensils, piece of shelf liner to increase grip to open containers and yellow theraputty with visual demo of exercises to perform to work on grip strength and [facility]                  Pertinent Vitals/Pain Pain Assessment: 0-10 Pain Score: 6  Pain Location: R leg/foot Pain Descriptors / Indicators: Shooting Pain Intervention(s): Premedicated before session     Hand Dominance Right   Extremity/Trunk Assessment Upper Extremity Assessment Upper Extremity Assessment: RUE deficits/detail;LUE deficits/detail RUE Deficits / Details: shoulder flexion ~120 degrees, R hand muscle wasting, unable to fully extend at PIP joints RUE Sensation: decreased light touch (to hand/digits) RUE Coordination: decreased fine motor (able to oppose all digits) LUE Deficits / Details: shoulder, elbow, wrist ROM grossly intact, similar muscle wasting in L hand compared to right with PIP joints lacking similar extension of ~ 10 degrees LUE Sensation: decreased light touch (in hand/digits) LUE Coordination: decreased fine motor (unable to oppose to 4-5th digits)   Lower Extremity Assessment Lower Extremity Assessment: Defer to PT evaluation       Communication Communication Communication: No difficulties   Cognition Arousal/Alertness: Awake/alert Behavior During Therapy: WFL for tasks assessed/performed Overall Cognitive Status: Within Functional Limits for tasks assessed                                 General Comments: tangential              Home Living Family/patient expects to be discharged to:: Private  residence Living Arrangements: Alone Available Help at Discharge: Family;Friend(s) Type of Home: House Home Access: Stairs to enter [employer] of Steps: 2 Entrance Stairs-Rails: Right;Left Home Layout: One level     Bathroom Shower/Tub: Teacher, early years/pre:  (higher toilet)     Home Equipment: Environmental consultant - 2 wheels;Walker - 4 wheels;Cane - quad;Bedside commode;Shower seat;Grab bars - tub/shower;Adaptive equipment Adaptive Equipment: Reacher;Long-handled shoe horn Additional Comments: patient reports cousin staying until friday, then mom and sister coming to stay for a while      Prior Functioning/Environment Level of Independence: Independent with assistive device(s)        Comments: patient reports increased difficulty for past ~1 month with ADL/IADLs due to decreased [facility] and strength in B hands        OT Problem List: Decreased strength;Decreased range of motion;Decreased activity tolerance;Impaired balance (sitting and/or standing);Decreased coordination;Decreased safety awareness;Decreased knowledge of use of DME or AE;Pain;Impaired UE functional use;Impaired sensation      OT Treatment/Interventions: Self-care/ADL training;Therapeutic exercise;DME and/or AE instruction;Therapeutic activities;Patient/family education;Balance training    OT Goals(Current goals can be found in the care plan section) Acute Rehab OT Goals Patient Stated Goal: use my hands again OT Goal Formulation: With patient Time For Goal Achievement: 10/17/20 Potential to Achieve Goals: Good  OT Frequency: Min 2X/week    AM-PAC OT "6 Clicks" Daily Activity     Outcome Measure Help from another person eating meals?: A Little Help from another person  taking care of personal grooming?: A Little Help from another person toileting, which includes using toliet, bedpan, or urinal?: A Little Help from another person bathing (including washing, rinsing, drying)?: A Little Help from  another person to put on and taking off regular upper body clothing?: A Little Help from another person to put on and taking off regular lower body clothing?: A Little 6 Click Score: 18   End of Session Equipment Utilized During Treatment: Rolling walker Nurse Communication: Mobility status  Activity Tolerance: Patient tolerated treatment well Patient left: in bed;with call bell/phone within reach  OT Visit Diagnosis: Unsteadiness on feet (R26.81);Muscle weakness (generalized) (M62.81);History of falling (Z91.81);Pain Pain - Right/Left: Right Pain - part of body: Leg;Ankle and joints of foot                Time: 0826-0920 OT Time Calculation (min): 54 min Charges:  OT General Charges $OT Visit: 1 Visit OT Evaluation $OT Eval Low Complexity: 1 Low OT Treatments $Self Care/Home Management : 38-52 mins  Delbert Phenix OT OT pager: [phone]   Rosemary Holms 10/03/2020, 9:42 AM

## 2020-10-03 NOTE — Discharge Summary (Addendum)
Physician Discharge Summary  Patient ID: [name] MRN: [id number] DOB/AGE: [birth year] 64 y.o.  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 She is walking, voiding, and tolerating a regular diet. Her wound is clean, dry, and without signs of infection. During the case the dura was breached. The defect was covered with duragen.   Treatments: surgery: ACDF C4/5  Discharge Exam: Blood pressure 139/86, pulse 91, temperature 98.2 F (36.8 C), temperature source Oral, resp. rate 16, height 4\' 11"  (1.499 m), weight 89.8 kg, last menstrual period 05/18/2016, SpO2 98 %. General appearance: alert, cooperative, appears stated age and mild distress  Disposition: Discharge disposition: 01-Home or Self Care      HERNIATED NUCLEUS PULPOSUS, CERVICAL  Allergies as of 10/03/2020      Reactions   Morphine And Related Nausea And Vomiting   Codeine Nausea Only   Cymbalta [duloxetine Hcl] Other (See Comments)   Head felt funny, ? thinking not right   Darvon Nausea Only   Hydrocodone Nausea Only   Hydrocodone-acetaminophen Nausea Only   Oxycodone Nausea Only   Propoxyphene Hcl Nausea Only   Rosuvastatin Other (See Comments)   Bone pain      Medication List    TAKE these medications   carvedilol 6.25 MG tablet Commonly known as: COREG Take 1 tablet (6.25 mg total) by mouth 2 (two) times daily.   dexamethasone 4 MG tablet Commonly known as: DECADRON Take 1 tablet (4 mg total) by mouth daily.   diclofenac Sodium 1 % Gel Commonly known as: VOLTAREN 4 gram tid prn   ferrous sulfate 325 (65 FE) MG tablet Take 325 mg by  mouth daily with breakfast.   FreeStyle Libre 14 Day Sensor Misc INJECT 1 SENSOR TO THE SKIN EVERY 14 DAYS FOR CONTINUOUS GLUCOSE MONITORING.   furosemide 40 MG tablet Commonly known as: LASIX TAKE 1 TABLET BY MOUTH EVERY DAY   gabapentin 100 MG capsule Commonly known as: Neurontin Take 2 capsules (200 mg total) by mouth at bedtime. What changed:   when to take this  reasons to take this   glucose blood test strip Commonly known as: ONE TOUCH ULTRA TEST 1 each by Other route 2 (two) times daily. Use to check blood sugars twice a day Dx E11.9   Klor-Con M20 20 MEQ tablet Generic drug: potassium chloride SA TAKE 1 TABLET BY MOUTH EVERY DAY What changed: how much to take   lidocaine-prilocaine cream Commonly known as: EMLA 4 gram tid prn What changed:   how much to take  how to take this  when to take this  reasons to take this  additional instructions   methylPREDNISolone 4 MG Tbpk tablet Commonly known as: MEDROL DOSEPAK Per package instructions   NON FORMULARY Use to check blood glucose daily.  Dx E11.65   omeprazole 40 MG capsule Commonly known as: PRILOSEC Take 40 mg by mouth daily as needed (Heartburn).   onetouch ultrasoft lancets 1 each by Other route 2 (two) times daily. Use to check blood sugars twice a day Dx E11.9   oxyCODONE 5 MG immediate release tablet Commonly known as: Oxy IR/ROXICODONE Take 1 tablet (5 mg total) by mouth every 4 (four) hours as needed  for severe pain.   oxyCODONE-acetaminophen 7.5-325 MG tablet Commonly known as: Percocet Take 1 tablet by mouth every 6 (six) hours as needed for up to 8 days for severe pain.   pravastatin 40 MG tablet Commonly known as: PRAVACHOL TAKE 1 TABLET BY MOUTH EVERY DAY   predniSONE 10 MG tablet Commonly known as: DELTASONE Take 10 mg by mouth daily.   rivaroxaban 20 MG Tabs tablet Commonly known as: XARELTO Take 1 tablet (20 mg total) by mouth daily with supper.   silver sulfADIAZINE  1 % cream Commonly known as: Silvadene Apply 1 application topically daily. What changed:   when to take this  reasons to take this   telmisartan-hydrochlorothiazide 80-12.5 MG tablet Commonly known as: MICARDIS HCT Take 1 tablet by mouth daily.   tiZANidine 4 MG tablet Commonly known as: ZANAFLEX Take 1 tablet (4 mg total) by mouth every 6 (six) hours as needed for muscle spasms.   traMADol 50 MG tablet Commonly known as: ULTRAM 1 to 2 PO Q 6 hours prn pain. What changed:   how much to take  how to take this  when to take this  reasons to take this  additional instructions   valACYclovir 500 MG tablet Commonly known as: VALTREX Take 1 tablet (500 mg total) by mouth 2 (two) times daily. What changed:   when to take this  reasons to take this   VITAMIN B-12 PO Take 1 tablet by mouth daily.   VITAMIN C PO Take 1 tablet by mouth daily.       Follow-up Information    [facility] [place] Call.   Why: Contact them to follow up on the transportation application.  Contact information: [phone]       Ashok Pall, MD Follow up in 2 week(s).   Specialty: Neurosurgery Why: please call the office to make an appointment Contact information: [address] [place] [address] [phone]               Signed: Ashok Pall 10/03/2020, 3:59 PM

## 2020-10-03 NOTE — Discharge Instructions (Signed)
Anterior Cervical Fusion °Care After °Pinching of the nerves is a common cause of long-term pain. When this happens, a procedure called an anterior cervical fusion is sometimes performed. It relieves the pressure on the pinched nerve roots or spinal cord in the neck. °An anterior cervical fusion means that the operation is done through the front (anterior) of your neck to fuse bones in your neck together. This procedure is done to relieve the pressure on pinched nerve roots or spinal cord. This operation is done to control the movement of your spine, which may be pressing on the nerves. This may relieve the pain. The procedure that stops the movement of the spine is called a fusion. The cut by the surgeon (incision) is usually within a skin fold line under your chin. After moving the neck muscles gently apart, the neurosurgeon uses an operating microscope and removes the injured intervertebral disk (the cushion or pad of tissue between the bones of the spine). This takes the pressure off the nerves or spinal cord. This is called decompression. The area where the disc was removed is then filled with a bone graft. The graft will fuse the vertebrae together over time. This means it causes the vertebral bodies to grow together. The bone graft may be obtained from your own bone (your hip for example), or may be obtained from a bone bank. Receiving bone from a bone bank is similar to a blood bank, only the bone comes from human donors who have recently died. This type of graft is referred to as allograft bone. The preformed bone plug is safe and will not be rejected by your body. It does not contain blood cells. °In some cases, the surgeon may use hardware in your neck to help stabilize it. This means that metal plates or pins or screws may be used to: °· Provide extra support to the neck.  °· Help the bones to grow together more easily.  °A cervical fusion procedure takes a couple hours to several hours, depending on  what needs to be done. Your caregiver will be able to answer your questions for you. °HOME CARE INSTRUCTIONS  °· It will be normal to have a sore throat and have difficulty swallowing foods for a couple weeks following surgery. See your caregiver if this seems to be getting worse rather than better.  °· You may resume normal diet and activities as directed or allowed. Generally, walking and stair climbing are fine. Avoid lifting more than ten pounds and do no lifting above your head.  °· If given a cervical collar, remove only for bathing and eating, or as directed.  °· Use only showers for cleaning up, with no bathing, until seen.  °· You may apply ice to the surgical or bone donor site for 15 to 20 minutes each hour while awake for the first couple days following surgery. Put the ice in a plastic bag and place a towel between the bag of ice and your skin.  °· Change dressings if necessary or as directed.  °· You may drive in 10 days  °· Take prescribed medication as directed. Only take over-the-counter or prescription medicines for pain, discomfort, or fever as directed by your caregiver.  °· Make an appointment to see your caregiver for suture or staple removal when instructed.  °· If physical therapy was prescribed, follow your caregiver's directions.  °SEEK IMMEDIATE MEDICAL CARE IF: °· There is redness, swelling, or increasing pain in the wound.  °· There is   pus coming from the wound.  °· An unexplained oral temperature over 102° F (38.9° C) develops.  °· There is a bad smell coming from the wound or dressing.  °· You have swelling in your calf or leg.  °· You develop shortness of breath or chest pain.  °· The wound edges break open after sutures or staples have been removed.  °· Your pain is not controlled with medicine.  °· You seem to be getting worse rather than better.  °Document Released: 05/19/2004 Document Revised: 06/17/2011 Document Reviewed: 07/25/2008 °[employer]® Patient Information ©2012 [employer]. °

## 2020-10-03 NOTE — Evaluation (Signed)
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 Pt underwent C4-5 ACDF on 10/02/2020.  Clinical Impression  Pt presents to PT s/p C4-5 ACDF. Pt with deficits in functional mobility, gait, balance, endurance, strength, power, sensation, and with pain. PT notes bilateral LE weakness with RLE weakness more significant than LLE. Pt also reports tingling sensation present in BLE toes up to ankle on RLE and toes to mid-foot in LLE. Pt with a history of previous falls but demonstrates no losses of balance this session when mobilizing with use of a RW. Pt is able to perform all mobility required in the home setting with with limited physical assistance. Pt will benefit from continued acute PT POC as well as HHPT at the time of discharge to continue progressing upon gait and balance training. Pt has no current DME needs at this time.    Follow Up Recommendations Home health PT;Supervision - Intermittent    Equipment Recommendations  None recommended by PT    Recommendations for Other Services       Precautions / Restrictions Precautions Precautions: Cervical Precaution Booklet Issued: Yes (comment) Precaution Comments: no brace needed, PT provides education on cervical precautions Restrictions Weight Bearing Restrictions: No      Mobility  Bed Mobility Overal bed mobility: Modified Independent             General bed mobility comments: cues for log roll technique    Transfers Overall transfer level: Needs assistance Equipment used: Rolling walker (2 wheeled) Transfers: Sit to/from Stand Sit to Stand: Supervision         General transfer comment: please see toilet transfer in ADL section  Ambulation/Gait Ambulation/Gait assistance: Supervision Gait Distance (Feet): 150  Feet Assistive device: Rolling walker (2 wheeled) Gait Pattern/deviations: Step-to pattern Gait velocity: reduced Gait velocity interpretation: <1.8 ft/sec, indicate of risk for recurrent falls General Gait Details: pt with shortened step-to gait, no significant gait deviations observed otherwise  Stairs Stairs: Yes Stairs assistance: Min guard Stair Management: One rail Right;Step to pattern;Sideways Number of Stairs: 2 General stair comments: cues to reduce twisting  Wheelchair Mobility    Modified Rankin (Stroke Patients Only)       Balance Overall balance assessment: Needs assistance Sitting-balance support: No upper extremity supported;Feet supported Sitting balance-Leahy Scale: Good     Standing balance support: During functional activity;Single extremity supported;Bilateral upper extremity supported Standing balance-Leahy Scale: Poor Standing balance comment: reliant on UE support of RW                             Pertinent Vitals/Pain Pain Assessment: 0-10 Pain Score: 6  Pain Location: RLE, foot Pain Descriptors / Indicators: Shooting Pain Intervention(s): Monitored during session    Home Living Family/patient expects to be discharged to:: Private residence Living Arrangements: Alone Available Help at Discharge: Family;Friend(s);Available 24 hours/day (24/7 initially) Type of Home: House Home Access: Stairs to enter Entrance Stairs-Rails: Psychiatric nurse of Steps: 2 Home Layout: One level Home Equipment: Walker - 4 wheels;Cane - quad;Bedside commode;Shower seat;Grab bars - tub/shower;Adaptive equipment Additional Comments: patient reports cousin staying until friday, then mom and sister coming to stay for a while    Prior Function Level of Independence: Independent with assistive device(s)         Comments: pt ambulating  with cane vs rollator prior to surgery     Hand Dominance   Dominant Hand: Right     Extremity/Trunk Assessment   Upper Extremity Assessment Upper Extremity Assessment: Defer to OT evaluation RUE Deficits / Details: shoulder flexion ~120 degrees, R hand muscle wasting, unable to fully extend at PIP joints RUE Sensation: decreased light touch (to hand/digits) RUE Coordination: decreased fine motor (able to oppose all digits) LUE Deficits / Details: shoulder, elbow, wrist ROM grossly intact, similar muscle wasting in L hand compared to right with PIP joints lacking similar extension of ~ 10 degrees LUE Sensation: decreased light touch (in hand/digits) LUE Coordination: decreased fine motor (unable to oppose to 4-5th digits)    Lower Extremity Assessment Lower Extremity Assessment: RLE deficits/detail;LLE deficits/detail RLE Deficits / Details: RLE hip flexion knee extension 4-/5, ankle PF/DF 3-/5 RLE Sensation: decreased light touch (pain and tingling) LLE Deficits / Details: Grossly 4-/5 strength LLE Sensation: decreased light touch (toes and mid-foot)    Cervical / Trunk Assessment Cervical / Trunk Assessment: Other exceptions Cervical / Trunk Exceptions: s/p ACDF  Communication   Communication: No difficulties  Cognition Arousal/Alertness: Awake/alert Behavior During Therapy: WFL for tasks assessed/performed Overall Cognitive Status: Within Functional Limits for tasks assessed                                 General Comments: tangential      General Comments General comments (skin integrity, edema, etc.): VSS on RA    Exercises     Assessment/Plan    PT Assessment Patient needs continued PT services  PT Problem List Decreased strength;Decreased activity tolerance;Decreased balance;Decreased mobility;Decreased knowledge of use of DME;Decreased knowledge of precautions;Impaired sensation;Pain       PT Treatment Interventions DME instruction;Gait training;Functional mobility training;Therapeutic activities;Stair training;Therapeutic  exercise;Balance training;Neuromuscular re-education;Patient/family education    PT Goals (Current goals can be found in the Care Plan section)  Acute Rehab PT Goals Patient Stated Goal: to return to independent mobility PT Goal Formulation: With patient Time For Goal Achievement: 10/17/20 Potential to Achieve Goals: Good    Frequency Min 5X/week   Barriers to discharge        Co-evaluation               AM-PAC PT "6 Clicks" Mobility  Outcome Measure Help needed turning from your back to your side while in a flat bed without using bedrails?: None Help needed moving from lying on your back to sitting on the side of a flat bed without using bedrails?: None Help needed moving to and from a bed to a chair (including a wheelchair)?: A Little Help needed standing up from a chair using your arms (e.g., wheelchair or bedside chair)?: A Little Help needed to walk in hospital room?: A Little Help needed climbing 3-5 steps with a railing? : A Little 6 Click Score: 20    End of Session   Activity Tolerance: Patient tolerated treatment well Patient left: in bed;with call bell/phone within reach Nurse Communication: Mobility status PT Visit Diagnosis: Other abnormalities of gait and mobility (R26.89);Muscle weakness (generalized) (M62.81);Other symptoms and signs involving the nervous system (R29.898);Pain Pain - Right/Left: Right (BLE) Pain - part of body: Leg;Ankle and joints of foot    Time: 8295-6213 PT Time Calculation (min) (ACUTE ONLY): 58 min   Charges:   PT Evaluation $PT Eval Moderate Complexity: 1 Mod PT Treatments $Gait Training: 8-22 mins $Therapeutic Activity: 8-22  mins        Zenaida Niece, PT, DPT Acute Rehabilitation Pager: [phone]   Zenaida Niece 10/03/2020, 10:59 AM

## 2020-10-04 ENCOUNTER — Ambulatory Visit: Payer: 59

## 2020-10-04 ENCOUNTER — Other Ambulatory Visit: Payer: 59

## 2020-10-04 ENCOUNTER — Ambulatory Visit: Payer: 59 | Admitting: Oncology

## 2020-10-09 DIAGNOSIS — Z96652 Presence of left artificial knee joint: Secondary | ICD-10-CM | POA: Insufficient documentation

## 2020-10-09 DIAGNOSIS — M25561 Pain in right knee: Secondary | ICD-10-CM | POA: Insufficient documentation

## 2020-10-21 ENCOUNTER — Other Ambulatory Visit: Payer: Self-pay

## 2020-10-21 ENCOUNTER — Encounter: Payer: Self-pay | Admitting: Podiatry

## 2020-10-21 ENCOUNTER — Ambulatory Visit (INDEPENDENT_AMBULATORY_CARE_PROVIDER_SITE_OTHER): Payer: 59 | Admitting: Podiatry

## 2020-10-21 ENCOUNTER — Other Ambulatory Visit: Payer: Self-pay | Admitting: Oncology

## 2020-10-21 ENCOUNTER — Ambulatory Visit (INDEPENDENT_AMBULATORY_CARE_PROVIDER_SITE_OTHER): Payer: 59

## 2020-10-21 DIAGNOSIS — R2681 Unsteadiness on feet: Secondary | ICD-10-CM

## 2020-10-21 DIAGNOSIS — M79672 Pain in left foot: Secondary | ICD-10-CM

## 2020-10-21 DIAGNOSIS — M21371 Foot drop, right foot: Secondary | ICD-10-CM

## 2020-10-21 DIAGNOSIS — M79671 Pain in right foot: Secondary | ICD-10-CM

## 2020-10-21 DIAGNOSIS — M779 Enthesopathy, unspecified: Secondary | ICD-10-CM

## 2020-10-22 NOTE — Progress Notes (Signed)
Subjective:   Patient ID: [name], female   DOB: 65 y.o.   MRN: [id number]   HPI Patient states over the last several months she has developed progressive problems with her lower and upper extremities and is developed a foot drop right and balance issues bilateral.  States that she is having trouble being able to balance and she did have neck surgery middle of December and is due to see her neurosurgeon tomorrow for postoperative care.  Her fingers have contracted somewhat and she did have nerve conduction study but most of this was done prior to when her symptoms started.  Patient does not smoke and is not currently active   Review of Systems  All other systems reviewed and are negative.       Objective:  Physical Exam Vitals and nursing note reviewed.  Constitutional:      Appearance: She is well-developed and well-nourished.  Cardiovascular:     Pulses: Intact distal pulses.  Pulmonary:     Effort: Pulmonary effort is normal.  Musculoskeletal:        General: Normal range of motion.  Skin:    General: Skin is warm.  Neurological:     Mental Status: She is alert.     Neurovascular status intact muscle strength found to be adequate with patient found to have diminishment of sharp dull vibratory bilateral and on the right she has no function of the anterior tibial muscle or extensor function.  Patient does have balance issues bilateral and also is developing a lot of pain more the right ankle or over the left ankle     Assessment:  Appears to be some form of neurological issue or possibly stroke that needs to be evaluated by her neurosurgeon who did surgery on her 3 weeks ago     Plan:  H&P reviewed with her and discussed what she needs to do.  Today I injected the sinus tarsi right 3 mg Kenalog 5 mg Xylocaine and I then went ahead and had the ped orthotist evaluate her who agrees she needs a AFO brace right and a balance brace left in order to provide form of  stability and that most likely she will not regain function of her anterior tibial tendon but is going to require further extensive diagnostic studies to understand pathology  X-rays indicate that there is no signs of stress fracture arthritis or or other bony pathology associated with her condition

## 2020-10-23 NOTE — Progress Notes (Signed)
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  (b) a total of 2 left axillary lymph nodes removed, one positive  (14) anti-HER-2 treatment resumed after surgery to be continued indefinitely  (a) echo 11/09/2019 shows an ejection fraction in the 60-65% range  (b) echo 02/09/2020 shows an ejection fraction in the 45 to 50% range  (c) echo 03/26/2020 shows an ejection fraction in the 55-60% range  (d) trastuzumab resumed 03/28/2020  (e) echo 05/06/2020 shows an ejection fraction in the 55-60% range  (f) trastuzumab discontinued after 06/20/2020 dose with progression  (14) switched to TDM-1/Kadcyla starting 07/11/2020  (a) new baseline PET scan 07/01/2020 shows significant supraclavicular, mediastinal and prevascular adenopathy with new small left pleural and pericardial effusions  (b) echo 06/28/2020 shows and ejection fraction in the 55-60% range  (c) PET scan on 08/15/2020 shows interval response to chemotherapy with decrease in size and SUV of lymphadenopathy, no new or progressive disease identified in abdomen, pelvis, or bones  (d) switched back to Herceptin as of 10/24/2020 secondary to patient's concerns regarding possible  neuropathy  (e) repeat echocardiography 11/21/2020  (14) adjuvant radiation being considered depending on overall response  (16) left lower extremity DVT documented by Doppler ultrasound 05/31/2020  (a) rivaroxaban/Xarelto started 05/31/2020  (17) osteoarthritis/ degenerative disease  (a) cervical spine MRI 09/08/2020 showed a herniated nucleus pulposus at cervical 4/5, no evidence of metastatic disease within the cervical spine  (b) lumbar MRI 09/27/2020 showed significant degenerative disease, no evidence of metastasis  (c) status post anterior cervical decompression C4/5 with arthrodesis 10/02/2020 ([facility])  (18) bilateral carpal tunnel syndrome documented by electromyography 08/05/2020, severe on the right, moderate on the left Krista Blue)   PLAN: Dyonna is now a year out from definitive diagnosis of metastatic breast cancer.  Her cancer is moderately well controlled and has been responding to T-DM1.  This agent however can cause or worsen neuropathy and given the very complex constellation of symptoms that she is experiencing I think it is prudent to back off to trastuzumab which generally does not affect neuropathic symptoms.  We can reassess after she has been off T-DM1 for 6 weeks.  She has significant back pain and some sciatica.  [facility] an excellent pain clinic--in fact that is our first choice for pain clinic referral--and I suggested she seek some assistance there.  She also has documented bilateral carpal tunnel and she could seek a surgical release of those symptoms.  Right now her pain is not well controlled.  She does have tramadol available and I refilled that but she says it does not work.  She has some oxycodone which he appropriately does not want to use for chronic pain.  She is on rivaroxaban which means we cannot use routine NSAIDs.  Certainly she can use Tylenol.  In addition I wrote for Celebrex.  She may take this up to twice daily and perhaps between the  Celebrex Tylenol and tramadol as she will be able to obtain some pain relief without being "knocked out" .  She will return to see [place] again in 3 weeks and will receive Herceptin again at that time.  She will see me again in 6 weeks, and before that visit she will have a restaging CT scan of the chest.  Total encounter time 40 minutes.*   Gstav C. Magrinat, MD 10/25/20 11:14 AM Medical Oncology [facility] H[facility]ology [address], [place] [address] Tel. [phone]    Fax. [phone]   I, Wilburn Mylar, am acting as scribe for Dr. [name]. Hobert Poplaski.  I, Lurline Del MD, have reviewed the above documentation for accuracy and completeness, and I agree with the above.    *Total Encounter Time as defined by the Centers for Medicare and Medicaid Services includes, in addition to the face-to-face time of a patient visit (documented in the note above) non-face-to-face time: obtaining and reviewing outside history, ordering and reviewing medications, tests or procedures, care coordination (communications with other health care professionals or caregivers) and documentation in the medical record.

## 2020-10-24 ENCOUNTER — Other Ambulatory Visit: Payer: Self-pay

## 2020-10-24 ENCOUNTER — Inpatient Hospital Stay (HOSPITAL_BASED_OUTPATIENT_CLINIC_OR_DEPARTMENT_OTHER): Payer: 59 | Admitting: Oncology

## 2020-10-24 ENCOUNTER — Inpatient Hospital Stay: Payer: 59 | Attending: Oncology

## 2020-10-24 ENCOUNTER — Inpatient Hospital Stay: Payer: 59

## 2020-10-24 VITALS — BP 123/85 | HR 86 | Temp 97.0°F | Resp 18 | Ht 59.0 in | Wt 194.6 lb

## 2020-10-24 DIAGNOSIS — C50112 Malignant neoplasm of central portion of left female breast: Secondary | ICD-10-CM

## 2020-10-24 DIAGNOSIS — Z86718 Personal history of other venous thrombosis and embolism: Secondary | ICD-10-CM | POA: Diagnosis not present

## 2020-10-24 DIAGNOSIS — Z7901 Long term (current) use of anticoagulants: Secondary | ICD-10-CM | POA: Insufficient documentation

## 2020-10-24 DIAGNOSIS — Z95828 Presence of other vascular implants and grafts: Secondary | ICD-10-CM

## 2020-10-24 DIAGNOSIS — Z5112 Encounter for antineoplastic immunotherapy: Secondary | ICD-10-CM | POA: Diagnosis not present

## 2020-10-24 DIAGNOSIS — Z8542 Personal history of malignant neoplasm of other parts of uterus: Secondary | ICD-10-CM | POA: Insufficient documentation

## 2020-10-24 DIAGNOSIS — Z9079 Acquired absence of other genital organ(s): Secondary | ICD-10-CM | POA: Insufficient documentation

## 2020-10-24 DIAGNOSIS — C50812 Malignant neoplasm of overlapping sites of left female breast: Secondary | ICD-10-CM

## 2020-10-24 DIAGNOSIS — Z90710 Acquired absence of both cervix and uterus: Secondary | ICD-10-CM | POA: Diagnosis not present

## 2020-10-24 DIAGNOSIS — Z923 Personal history of irradiation: Secondary | ICD-10-CM | POA: Insufficient documentation

## 2020-10-24 DIAGNOSIS — K760 Fatty (change of) liver, not elsewhere classified: Secondary | ICD-10-CM | POA: Diagnosis not present

## 2020-10-24 DIAGNOSIS — C771 Secondary and unspecified malignant neoplasm of intrathoracic lymph nodes: Secondary | ICD-10-CM

## 2020-10-24 DIAGNOSIS — Z171 Estrogen receptor negative status [ER-]: Secondary | ICD-10-CM

## 2020-10-24 DIAGNOSIS — Z90722 Acquired absence of ovaries, bilateral: Secondary | ICD-10-CM | POA: Insufficient documentation

## 2020-10-24 DIAGNOSIS — Z79899 Other long term (current) drug therapy: Secondary | ICD-10-CM | POA: Insufficient documentation

## 2020-10-24 DIAGNOSIS — Z7189 Other specified counseling: Secondary | ICD-10-CM | POA: Diagnosis not present

## 2020-10-24 DIAGNOSIS — C50412 Malignant neoplasm of upper-outer quadrant of left female breast: Secondary | ICD-10-CM | POA: Diagnosis present

## 2020-10-24 DIAGNOSIS — T451X5A Adverse effect of antineoplastic and immunosuppressive drugs, initial encounter: Secondary | ICD-10-CM

## 2020-10-24 DIAGNOSIS — C541 Malignant neoplasm of endometrium: Secondary | ICD-10-CM

## 2020-10-24 DIAGNOSIS — Z170 Estrogen receptor positive status [ER+]: Secondary | ICD-10-CM | POA: Diagnosis not present

## 2020-10-24 DIAGNOSIS — G5601 Carpal tunnel syndrome, right upper limb: Secondary | ICD-10-CM

## 2020-10-24 DIAGNOSIS — C773 Secondary and unspecified malignant neoplasm of axilla and upper limb lymph nodes: Secondary | ICD-10-CM | POA: Diagnosis present

## 2020-10-24 DIAGNOSIS — D6481 Anemia due to antineoplastic chemotherapy: Secondary | ICD-10-CM

## 2020-10-24 DIAGNOSIS — E109 Type 1 diabetes mellitus without complications: Secondary | ICD-10-CM | POA: Insufficient documentation

## 2020-10-24 DIAGNOSIS — G5602 Carpal tunnel syndrome, left upper limb: Secondary | ICD-10-CM

## 2020-10-24 LAB — CBC WITH DIFFERENTIAL (CANCER CENTER ONLY)
Abs Immature Granulocytes: 0.02 10*3/uL (ref 0.00–0.07)
Basophils Absolute: 0 10*3/uL (ref 0.0–0.1)
Basophils Relative: 0 %
Eosinophils Absolute: 0 10*3/uL (ref 0.0–0.5)
Eosinophils Relative: 0 %
HCT: 34.9 % — ABNORMAL LOW (ref 36.0–46.0)
Hemoglobin: 11.4 g/dL — ABNORMAL LOW (ref 12.0–15.0)
Immature Granulocytes: 0 %
Lymphocytes Relative: 16 %
Lymphs Abs: 1.2 10*3/uL (ref 0.7–4.0)
MCH: 29.5 pg (ref 26.0–34.0)
MCHC: 32.7 g/dL (ref 30.0–36.0)
MCV: 90.4 fL (ref 80.0–100.0)
Monocytes Absolute: 0.7 10*3/uL (ref 0.1–1.0)
Monocytes Relative: 10 %
Neutro Abs: 5.3 10*3/uL (ref 1.7–7.7)
Neutrophils Relative %: 74 %
Platelet Count: 208 10*3/uL (ref 150–400)
RBC: 3.86 MIL/uL — ABNORMAL LOW (ref 3.87–5.11)
RDW: 15.5 % (ref 11.5–15.5)
WBC Count: 7.3 10*3/uL (ref 4.0–10.5)
nRBC: 0 % (ref 0.0–0.2)

## 2020-10-24 LAB — CMP (CANCER CENTER ONLY)
ALT: 19 U/L (ref 0–44)
AST: 29 U/L (ref 15–41)
Albumin: 3.3 g/dL — ABNORMAL LOW (ref 3.5–5.0)
Alkaline Phosphatase: 45 U/L (ref 38–126)
Anion gap: 11 (ref 5–15)
BUN: 27 mg/dL — ABNORMAL HIGH (ref 8–23)
CO2: 25 mmol/L (ref 22–32)
Calcium: 9.8 mg/dL (ref 8.9–10.3)
Chloride: 100 mmol/L (ref 98–111)
Creatinine: 1.14 mg/dL — ABNORMAL HIGH (ref 0.44–1.00)
GFR, Estimated: 54 mL/min — ABNORMAL LOW (ref >60)
Glucose, Bld: 156 mg/dL — ABNORMAL HIGH (ref 70–99)
Potassium: 3.2 mmol/L — ABNORMAL LOW (ref 3.5–5.1)
Sodium: 136 mmol/L (ref 135–145)
Total Bilirubin: 0.6 mg/dL (ref 0.3–1.2)
Total Protein: 8.4 g/dL — ABNORMAL HIGH (ref 6.5–8.1)

## 2020-10-24 MED ORDER — ACETAMINOPHEN 325 MG PO TABS
ORAL_TABLET | ORAL | Status: AC
  Filled 2020-10-24: qty 2

## 2020-10-24 MED ORDER — SODIUM CHLORIDE 0.9 % IV SOLN
Freq: Once | INTRAVENOUS | Status: AC
  Filled 2020-10-24: qty 250

## 2020-10-24 MED ORDER — DIPHENHYDRAMINE HCL 25 MG PO CAPS
25.0000 mg | ORAL_CAPSULE | Freq: Once | ORAL | Status: AC
  Administered 2020-10-24: 25 mg via ORAL

## 2020-10-24 MED ORDER — DOCUSATE SODIUM 50 MG PO CAPS: 50.0000 mg | ORAL_CAPSULE | Freq: Two times a day (BID) | ORAL | 0 refills | Status: DC

## 2020-10-24 MED ORDER — CELECOXIB 200 MG PO CAPS: 200.0000 mg | ORAL_CAPSULE | Freq: Two times a day (BID) | ORAL | 3 refills | Status: DC

## 2020-10-24 MED ORDER — SODIUM CHLORIDE 0.9% FLUSH
10.0000 mL | INTRAVENOUS | Status: DC | PRN
  Administered 2020-10-24: 10 mL
  Filled 2020-10-24: qty 10

## 2020-10-24 MED ORDER — DIPHENHYDRAMINE HCL 25 MG PO CAPS
ORAL_CAPSULE | ORAL | Status: AC
  Filled 2020-10-24: qty 1

## 2020-10-24 MED ORDER — TRASTUZUMAB-ANNS CHEMO 150 MG IV SOLR
6.0000 mg/kg | Freq: Once | INTRAVENOUS | Status: AC
  Administered 2020-10-24: 525 mg via INTRAVENOUS
  Filled 2020-10-24: qty 25

## 2020-10-24 MED ORDER — SODIUM CHLORIDE 0.9% FLUSH
10.0000 mL | Freq: Once | INTRAVENOUS | Status: AC
Start: 2020-10-24 — End: 2020-10-24
  Administered 2020-10-24: 10 mL
  Filled 2020-10-24: qty 10

## 2020-10-24 MED ORDER — ACETAMINOPHEN 325 MG PO TABS
650.0000 mg | ORAL_TABLET | Freq: Once | ORAL | Status: AC
  Administered 2020-10-24: 650 mg via ORAL

## 2020-10-24 MED ORDER — HEPARIN SOD (PORK) LOCK FLUSH 100 UNIT/ML IV SOLN
500.0000 [IU] | Freq: Once | INTRAVENOUS | Status: AC | PRN
  Administered 2020-10-24: 500 [IU]
  Filled 2020-10-24: qty 5

## 2020-10-24 NOTE — Progress Notes (Signed)
Patient previously tolerated Kanjinti. Spoke with RN and ok to infuse over 30 minutes.   Demetrius Charity, PharmD

## 2020-10-24 NOTE — Progress Notes (Signed)
Per Dr Darnelle Catalan first dose of Kanjinti should be 6 mg/kg - no loading dose needed.  T.O. Dr Magrinat/Val Pollie Friar, RN/Andreia Gandolfi [name], PharmD

## 2020-10-24 NOTE — Patient Instructions (Signed)
[facility] Discharge Instructions for Patients Receiving Chemotherapy  Today you received the following chemotherapy agents trastuzumab (Kanjinti)  To help prevent nausea and vomiting after your treatment, we encourage you to take your nausea medication as directed.    If you develop nausea and vomiting that is not controlled by your nausea medication, call the clinic.   BELOW ARE SYMPTOMS THAT SHOULD BE REPORTED IMMEDIATELY:  *FEVER GREATER THAN 100.5 F  *CHILLS WITH OR WITHOUT FEVER  NAUSEA AND VOMITING THAT IS NOT CONTROLLED WITH YOUR NAUSEA MEDICATION  *UNUSUAL SHORTNESS OF BREATH  *UNUSUAL BRUISING OR BLEEDING  TENDERNESS IN MOUTH AND THROAT WITH OR WITHOUT PRESENCE OF ULCERS  *URINARY PROBLEMS  *BOWEL PROBLEMS  UNUSUAL RASH Items with * indicate a potential emergency and should be followed up as soon as possible.  Feel free to call the clinic should you have any questions or concerns. The clinic phone number is [phone].  Please show the CHEMO ALERT CARD at check-in to the Emergency Department and triage nurse.   Trastuzumab injection for infusion What is this medicine? TRASTUZUMAB (tras TOO zoo mab) is a monoclonal antibody. It is used to treat breast cancer and stomach cancer. This medicine may be used for other purposes; ask your health care provider or pharmacist if you have questions. COMMON BRAND NAME(S): Herceptin, Adalberto Ill, Trazimera What should I tell my health care provider before I take this medicine? They need to know if you have any of these conditions:  heart disease  heart failure  lung or breathing disease, like asthma  an unusual or allergic reaction to trastuzumab, benzyl alcohol, or other medications, foods, dyes, or preservatives  pregnant or trying to get pregnant  breast-feeding How should I use this medicine? This drug is given as an infusion into a vein. It is administered in a  hospital or clinic by a specially trained health care professional. Talk to your pediatrician regarding the use of this medicine in children. This medicine is not approved for use in children. Overdosage: If you think you have taken too much of this medicine contact a poison control center or emergency room at once. NOTE: This medicine is only for you. Do not share this medicine with others. What if I miss a dose? It is important not to miss a dose. Call your doctor or health care professional if you are unable to keep an appointment. What may interact with this medicine? This medicine may interact with the following medications:  certain types of chemotherapy, such as daunorubicin, doxorubicin, epirubicin, and idarubicin This list may not describe all possible interactions. Give your health care provider a list of all the medicines, herbs, non-prescription drugs, or dietary supplements you use. Also tell them if you smoke, drink alcohol, or use illegal drugs. Some items may interact with your medicine. What should I watch for while using this medicine? Visit your doctor for checks on your progress. Report any side effects. Continue your course of treatment even though you feel ill unless your doctor tells you to stop. Call your doctor or health care professional for advice if you get a fever, chills or sore throat, or other symptoms of a cold or flu. Do not treat yourself. Try to avoid being around people who are sick. You may experience fever, chills and shaking during your first infusion. These effects are usually mild and can be treated with other medicines. Report any side effects during the infusion to your health care professional.  Fever and chills usually do not happen with later infusions. Do not become pregnant while taking this medicine or for 7 months after stopping it. Women should inform their doctor if they wish to become pregnant or think they might be pregnant. Women of child-bearing  potential will need to have a negative pregnancy test before starting this medicine. There is a potential for serious side effects to an unborn child. Talk to your health care professional or pharmacist for more information. Do not breast-feed an infant while taking this medicine or for 7 months after stopping it. Women must use effective birth control with this medicine. What side effects may I notice from receiving this medicine? Side effects that you should report to your doctor or health care professional as soon as possible:  allergic reactions like skin rash, itching or hives, swelling of the face, lips, or tongue  chest pain or palpitations  cough  dizziness  feeling faint or lightheaded, falls  fever  general ill feeling or flu-like symptoms  signs of worsening heart failure like breathing problems; swelling in your legs and feet  unusually weak or tired Side effects that usually do not require medical attention (report to your doctor or health care professional if they continue or are bothersome):  bone pain  changes in taste  diarrhea  joint pain  nausea/vomiting  weight loss This list may not describe all possible side effects. Call your doctor for medical advice about side effects. You may report side effects to FDA at 1-800-FDA-1088. Where should I keep my medicine? This drug is given in a hospital or clinic and will not be stored at home. NOTE: This sheet is a summary. It may not cover all possible information. If you have questions about this medicine, talk to your doctor, pharmacist, or health care provider.  2020 Elsevier/Gold Standard (2016-09-29 14:37:52)

## 2020-10-24 NOTE — Progress Notes (Signed)
.  The following biosimilar Kanjinti (trastuzumab-anns) has been selected for use in this patient per insurance guidance.  Pryor Ochoa, PharmD

## 2020-10-25 ENCOUNTER — Encounter: Payer: Self-pay | Admitting: Licensed Clinical Social Worker

## 2020-10-25 DIAGNOSIS — G5602 Carpal tunnel syndrome, left upper limb: Secondary | ICD-10-CM | POA: Insufficient documentation

## 2020-10-25 DIAGNOSIS — G5601 Carpal tunnel syndrome, right upper limb: Secondary | ICD-10-CM | POA: Insufficient documentation

## 2020-10-25 NOTE — Progress Notes (Signed)
[place] [facility]  Clinical Social Work was referred by patient/ cousin for assistance with transportation and food needs.  Clinical Social Worker spoke with patient and cousin, [name], via phone. They relayed pt's decline physically in the last 2 months, making her unable to drive, difficult to walk even with a rollator, and extremely difficult to prepare own meals.  Asking for assistance with transportation and meals (needing prepared meals that can be brought to her or she can be picked up and driven to).  CSW referred patient to [employer] [place] for meal assistance [employer] on Wheels, community meals). Referred to [facility] transportation and provided patient with application for [employer] [place], including professional verification form.  Discussed [facility], but patient uninterested at this time as she does not want to risk having to change any doctors.   Patient will call with any other questions or concerns.   [place], [place] Worker [employer]

## 2020-10-30 ENCOUNTER — Encounter: Payer: Self-pay | Admitting: Licensed Clinical Social Worker

## 2020-10-30 ENCOUNTER — Telehealth: Payer: Self-pay | Admitting: *Deleted

## 2020-10-30 ENCOUNTER — Encounter: Payer: Self-pay | Admitting: Oncology

## 2020-10-30 NOTE — Progress Notes (Signed)
Enrolledpt in the [employer] First Step program forKanjinti for $20,000 per calendar yearfrom1/12/22. Pt will pay $0 for herfirstdose or cycleand $75for each subsequent dose or cycle.

## 2020-10-30 NOTE — Progress Notes (Signed)
[place] [facility]  Received TC from [employer] (family member of pt). CSW called back and spoke with patient and Ms. McNeil. They called transportation but were told they had not received the referral. CSW re-sent today.  Patient also requesting connection for therapy. CSW explained options available through [facility] and community. Patient chose referral to [facility]. CSW submitted referral today and provided patient with contact information.   Christeen Douglas, LCSW

## 2020-10-30 NOTE — Telephone Encounter (Signed)
Connected with JAXSON KEENER [phone]), regarding voicemail.  "[employer]ability" needs additional information as previously requested October 12th, 19th and 26th, 2021.  All medical information beginning April 18, 2020 to present has not been received is needed for continued disability.  Have you all received the new signed release of information I signed for [employer]?    Provided [place] Information Management Department (H.I.M.). phone number [phone], option 1) to inquire.  Record requests are managed offsite by [facility] H.I.M. department.    Declined H.I.M. fax number.  Informed with this call [employer] may fax record request on company letterhead to [phone].  Reports using cane to get around with difficulty, trouble holding pen to write yet read back H.I.M. phone number.

## 2020-11-08 ENCOUNTER — Telehealth: Payer: Self-pay | Admitting: Podiatry

## 2020-11-08 NOTE — Telephone Encounter (Signed)
Pt left message yesterday about her appt for Monday for the possible brace..She stated she was told someone would call with benefits.  I was not asked to call pt but did check benefits and it is covered @ 80% after 500.00 individual deductible. (met 0)  I have left a message for pt to call me back to discuss.we bill the insurance 1643.00 for the brace. I am not sure what the allowable is.

## 2020-11-11 ENCOUNTER — Other Ambulatory Visit: Payer: 59 | Admitting: Orthotics
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
## 2020-11-13 ENCOUNTER — Telehealth: Payer: Self-pay | Admitting: *Deleted

## 2020-11-13 NOTE — Telephone Encounter (Signed)
Received call from pt stating she can't make appt tomorrow b/c her feet are hurting so bad & she also doesn't have transportation.  She states her fingers are curved & her pain is worse especially at night.  Tried to encourage her to come in to be seen or at least talk with Mendel Ryder by phone.  She states that no one has been able to tell her what is wrong & why she has this pain.  Message to Dr Jana Hakim.

## 2020-11-14 ENCOUNTER — Inpatient Hospital Stay: Payer: 59

## 2020-11-14 ENCOUNTER — Inpatient Hospital Stay: Payer: 59 | Admitting: Adult Health
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
## 2020-11-19 ENCOUNTER — Other Ambulatory Visit: Payer: Self-pay | Admitting: *Deleted

## 2020-11-19 ENCOUNTER — Encounter: Payer: Self-pay | Admitting: Oncology

## 2020-11-19 DIAGNOSIS — G249 Dystonia, unspecified: Secondary | ICD-10-CM

## 2020-11-19 DIAGNOSIS — E1142 Type 2 diabetes mellitus with diabetic polyneuropathy: Secondary | ICD-10-CM

## 2020-11-19 DIAGNOSIS — G510 Bell's palsy: Secondary | ICD-10-CM

## 2020-11-19 DIAGNOSIS — M5000 Cervical disc disorder with myelopathy, unspecified cervical region: Secondary | ICD-10-CM

## 2020-11-19 DIAGNOSIS — G1220 Motor neuron disease, unspecified: Secondary | ICD-10-CM

## 2020-11-19 LAB — HM MAMMOGRAPHY

## 2020-11-19 NOTE — Telephone Encounter (Signed)
This RN reviewed with MD phone discussion this RN had with pt on 1/28 per her ongoing neuro deficits affecting all of her ADL's " no one can tell me why this is happening or what is causing it "  Note she recently had nerve decompression with no noted benefit.  She has spoken with her case manager with her insurance who states pt may benefit from MRI of the brain and neurology referral.  Above orders placed post discussion with MD.  This RN called pt to discuss- obtained identified VM- message left per above with request to return call.

## 2020-11-20 ENCOUNTER — Encounter: Payer: Self-pay | Admitting: Internal Medicine

## 2020-11-21 ENCOUNTER — Ambulatory Visit (HOSPITAL_BASED_OUTPATIENT_CLINIC_OR_DEPARTMENT_OTHER)
Admission: RE | Admit: 2020-11-21 | Discharge: 2020-11-21 | Disposition: A | Discharge status: Home / Self Care | Payer: 59 | Source: Ambulatory Visit | Attending: Cardiology | Admitting: Cardiology

## 2020-11-21 ENCOUNTER — Other Ambulatory Visit: Payer: Self-pay

## 2020-11-21 ENCOUNTER — Ambulatory Visit (HOSPITAL_COMMUNITY)
Admission: RE | Admit: 2020-11-21 | Discharge: 2020-11-21 | Disposition: A | Discharge status: Home / Self Care | Payer: 59 | Source: Ambulatory Visit | Attending: Cardiology | Admitting: Cardiology

## 2020-11-21 ENCOUNTER — Encounter (HOSPITAL_COMMUNITY): Payer: Self-pay | Admitting: Cardiology

## 2020-11-21 VITALS — BP 140/80 | HR 75 | Wt 186.6 lb

## 2020-11-21 DIAGNOSIS — Z86718 Personal history of other venous thrombosis and embolism: Secondary | ICD-10-CM | POA: Diagnosis not present

## 2020-11-21 DIAGNOSIS — Z79899 Other long term (current) drug therapy: Secondary | ICD-10-CM | POA: Diagnosis not present

## 2020-11-21 DIAGNOSIS — Z8542 Personal history of malignant neoplasm of other parts of uterus: Secondary | ICD-10-CM | POA: Insufficient documentation

## 2020-11-21 DIAGNOSIS — Z171 Estrogen receptor negative status [ER-]: Secondary | ICD-10-CM | POA: Diagnosis not present

## 2020-11-21 DIAGNOSIS — E669 Obesity, unspecified: Secondary | ICD-10-CM | POA: Insufficient documentation

## 2020-11-21 DIAGNOSIS — I429 Cardiomyopathy, unspecified: Secondary | ICD-10-CM | POA: Diagnosis not present

## 2020-11-21 DIAGNOSIS — Z01818 Encounter for other preprocedural examination: Secondary | ICD-10-CM | POA: Diagnosis present

## 2020-11-21 DIAGNOSIS — E785 Hyperlipidemia, unspecified: Secondary | ICD-10-CM | POA: Insufficient documentation

## 2020-11-21 DIAGNOSIS — I5032 Chronic diastolic (congestive) heart failure: Secondary | ICD-10-CM | POA: Diagnosis not present

## 2020-11-21 DIAGNOSIS — Z853 Personal history of malignant neoplasm of breast: Secondary | ICD-10-CM | POA: Diagnosis not present

## 2020-11-21 DIAGNOSIS — Z7901 Long term (current) use of anticoagulants: Secondary | ICD-10-CM | POA: Diagnosis not present

## 2020-11-21 DIAGNOSIS — E119 Type 2 diabetes mellitus without complications: Secondary | ICD-10-CM | POA: Insufficient documentation

## 2020-11-21 DIAGNOSIS — C50812 Malignant neoplasm of overlapping sites of left female breast: Secondary | ICD-10-CM | POA: Diagnosis not present

## 2020-11-21 DIAGNOSIS — I110 Hypertensive heart disease with heart failure: Secondary | ICD-10-CM | POA: Diagnosis not present

## 2020-11-21 DIAGNOSIS — Z6837 Body mass index (BMI) 37.0-37.9, adult: Secondary | ICD-10-CM | POA: Insufficient documentation

## 2020-11-21 HISTORY — DX: Heart failure, unspecified: I50.9

## 2020-11-21 LAB — ECHOCARDIOGRAM COMPLETE
Area-P 1/2: 3.72 cm2
S' Lateral: 2.7 cm
Single Plane A4C EF: 53.4 %

## 2020-11-21 MED ORDER — POTASSIUM CHLORIDE CRYS ER 20 MEQ PO TBCR: 20.0000 meq | EXTENDED_RELEASE_TABLET | Freq: Every day | ORAL | 3 refills | Status: DC

## 2020-11-21 MED ORDER — RIVAROXABAN 20 MG PO TABS: 20.0000 mg | ORAL_TABLET | Freq: Every day | ORAL | 3 refills | Status: DC

## 2020-11-21 MED ORDER — PRAVASTATIN SODIUM 40 MG PO TABS: 40.0000 mg | ORAL_TABLET | Freq: Every day | ORAL | 3 refills | Status: DC

## 2020-11-21 MED ORDER — TELMISARTAN-HCTZ 80-12.5 MG PO TABS: 1.0000 | ORAL_TABLET | Freq: Every day | ORAL | 3 refills | Status: DC

## 2020-11-21 MED ORDER — CARVEDILOL 6.25 MG PO TABS: 6.2500 mg | ORAL_TABLET | Freq: Two times a day (BID) | ORAL | 3 refills | Status: DC

## 2020-11-21 NOTE — Patient Instructions (Addendum)
Restart your medications. Refills have been sent in to your pharmacy.  Your physician recommends that you schedule a follow-up appointment in: 3 months with an echocardiogram  Your physician has requested that you have an echocardiogram. Echocardiography is a painless test that uses sound waves to create images of your heart. It provides your doctor with information about the size and shape of your heart and how well your heart's chambers and valves are working. This procedure takes approximately one hour. There are no restrictions for this procedure.  If you have any questions or concerns before your next appointment please send [place] a message through [place] or call our office at [phone].    TO LEAVE A MESSAGE FOR THE NURSE SELECT OPTION 2, PLEASE LEAVE A MESSAGE INCLUDING: . YOUR NAME . DATE OF BIRTH . CALL BACK NUMBER . REASON FOR CALL**this is important as we prioritize the call backs  YOU WILL RECEIVE A CALL BACK THE SAME DAY AS LONG AS YOU CALL BEFORE 4:00 PM

## 2020-11-21 NOTE — Progress Notes (Signed)
  Echocardiogram 2D Echocardiogram has been performed.  [name] 11/21/2020, 1:59 PM

## 2020-11-22 DIAGNOSIS — M21371 Foot drop, right foot: Secondary | ICD-10-CM | POA: Insufficient documentation

## 2020-11-23 NOTE — Progress Notes (Signed)
Oncology: Dr. Magrinat  65 y.o. with history of DM, HTN, hyperlipidemia, endometrial cancer, and breast cancer was referred by Dr. Magrinat for cardio-oncology evaluation.  Initial breast cancer diagnosis was in 2012.  She had left breast lumpectomy and radiation.  Recurrence in 7/20, ER+/PR-/HER2+.  She had chemotherapy with trastuzumab/pertuzumab/carboplatin/docetaxol x 6 starting 2/21.  With fall in EF in 4/21, Herceptin was held.  Echo in 4/21 showed EF 45-50%, Coreg was started. Echo in 6/21 showed EF back up to 55-60%, and Herceptin was restarted.  In 6/21, she had lumpectomy.  Echo in 7/21 showed EF stable at 55-60%.    Echo in 9/21 showed EF 55-60% with normal RV, strain images did not track well.  Coronary CTA was done in 9/21, showing coronary artery calcium score 0 and no significant CAD.    PET in 9/21 with new lymphadenopathy.  She was started on TDM-1/Kadcyla but later changed back to Herceptin which will continue for long-term.   In 12/21, she had a cervical discectomy and fusion.   Echo was done today and reviewed, EF 55% with GLS -10.7% but poor endocardial tracing. Normal RV.   Patient returns for followup of diastolic CHF.  Pain complaint is pain and burning in hands and feet consistent with neuropathy.  This is quite severe.  She walks with a walker and denies exertional dyspnea and chest pain now though she is not very active. She has been off her cardiac meds for about 2 wks as she has been dealing with severe neuropathic pain.   Labs (5/21): K 4.1, creatinine 0.89 Labs (7/21): K 4.2, creatinine 0.95 Labs (8/21): LDL 81, BNP 36 Labs (9/21): K 3.7, creatinine 0.94 Labs (1/22): K 3.2, creatinine 1.14  PMH: 1. HTN 2. Diabetes 3. Endometrial carcinoma: 8/17 diagnosis, had hysterectomy and BSO.  4. Hyperlipidemia 5. Breast cancer: Initial diagnosis in 2012.  Had left breast lumpectomy and radiation.  Recurrence in 7/20, ER+/PR-/HER2+.  She had chemotherapy with  trastuzumab/pertuzumab/carboplatin/docetaxol x 6 starting 2/21.  With fall in EF in 4/21, Herceptin was held but later restarted.  Lumpectomy 6/21.  - Echo (1/21): EF 60-65%, GLS -20%, normal RV.  - Echo (4/21): EF 45-50%, global hypokinesis, GLS -12.6%, mildly decreased RV function.  - Echo (6/21): EF 55-60%, GLS -14.4%, mild LVH, PASP 38, RV normal.  - Echo (7/21): EF 55-60%, mild LVH, GLS -16.9%, RV  Normal - Echo (9/21): EF 55-60%, grade 2 diastolic dysfunction, GLS -11.6% (poor endocardial tracking), normal RV, normal IVC.  - Echo (2/22): EF 55%, GLS -10.7% (poor endocardial tracking), normal RV.  6. Coronary calcium score scan (8/21): CAC 0 Agatston units.  - Coronary CTA (9/21): CAC 0 Agatston units.  No significant CAD.  7. DVT: Left leg, 8/21.   Social History   Socioeconomic History  . Marital status: Divorced    Spouse name: Not on file  . Number of children: 0  . Years of education: Not on file  . Highest education level: Not on file  Occupational History  . Occupation: Social worker    Employer: [employer]    Comment: retired  Tobacco Use  . Smoking status: Never Smoker  . Smokeless tobacco: Never Used  Vaping Use  . Vaping Use: Never used  Substance and Sexual Activity  . Alcohol use: Not Currently    Comment: occasional  . Drug use: No  . Sexual activity: Not Currently  Other Topics Concern  . Not on file  Social History Narrative   1   son deceased with homicide   Patient has been divorced twice   Social Determinants of Health   Financial Resource Strain: Not on file  Food Insecurity: Not on file  Transportation Needs: Unmet Transportation Needs  . Lack of Transportation (Medical): Yes  . Lack of Transportation (Non-Medical): Yes  Physical Activity: Not on file  Stress: Not on file  Social Connections: Not on file  Intimate Partner Violence: Not on file   Family History  Problem Relation Age of Onset  . Diabetes Mother   . Hypertension Mother    . Stroke Mother   . Heart disease Father        COPD  . Alcohol abuse Father        ETOH dependence  . Breast cancer Maternal Aunt        dx in her 41s  . Lung cancer Maternal Uncle   . Breast cancer Paternal Aunt   . Cancer Maternal Grandmother        salivary gland cancer  . Colon cancer Neg Hx    ROS: All systems reviewed and negative except as per HPI.   Current Outpatient Medications  Medication Sig Dispense Refill  . Ascorbic Acid (VITAMIN C PO) Take 1 tablet by mouth daily.    . Continuous Blood Gluc Sensor (FREESTYLE LIBRE 14 DAY SENSOR) MISC INJECT 1 SENSOR TO THE SKIN EVERY 14 DAYS FOR CONTINUOUS GLUCOSE MONITORING.    . Cyanocobalamin (VITAMIN B-12 PO) Take 1 tablet by mouth daily.    . ferrous sulfate 325 (65 FE) MG tablet Take 325 mg by mouth daily with breakfast.    . glucose blood (ONE TOUCH ULTRA TEST) test strip 1 each by Other route 2 (two) times daily. Use to check blood sugars twice a day Dx E11.9 100 each 0  . Lancets (ONETOUCH ULTRASOFT) lancets 1 each by Other route 2 (two) times daily. Use to check blood sugars twice a day Dx E11.9 100 each 0  . lidocaine-prilocaine (EMLA) cream 4 gram tid prn (Patient taking differently: Apply 1 application topically 3 (three) times daily as needed (on port).) 120 g 5  . omeprazole (PRILOSEC) 40 MG capsule Take 40 mg by mouth daily as needed (Heartburn).    [name] oxyCODONE (OXY IR/ROXICODONE) 5 MG immediate release tablet Take 1 tablet (5 mg total) by mouth every 4 (four) hours as needed for severe pain. 30 tablet 0  . silver sulfADIAZINE (SILVADENE) 1 % cream Apply 1 application topically daily. (Patient taking differently: Apply 1 application topically daily as needed (raw open space).) 50 g 0  . tiZANidine (ZANAFLEX) 4 MG tablet Take 1 tablet (4 mg total) by mouth every 6 (six) hours as needed for muscle spasms. 60 tablet 0  . carvedilol (COREG) 6.25 MG tablet Take 1 tablet (6.25 mg total) by mouth 2 (two) times daily. 180 tablet  3  . furosemide (LASIX) 40 MG tablet TAKE 1 TABLET BY MOUTH EVERY DAY (Patient taking differently: Take 40 mg by mouth daily.) 30 tablet 3  . potassium chloride SA (KLOR-CON M20) 20 MEQ tablet Take 1 tablet (20 mEq total) by mouth daily. 90 tablet 3  . pravastatin (PRAVACHOL) 40 MG tablet Take 1 tablet (40 mg total) by mouth daily. 90 tablet 3  . rivaroxaban (XARELTO) 20 MG TABS tablet Take 1 tablet (20 mg total) by mouth daily with supper. 90 tablet 3  . telmisartan-hydrochlorothiazide (MICARDIS HCT) 80-12.5 MG tablet Take 1 tablet by mouth daily. 90 tablet 3  No current facility-administered medications for this encounter.   Facility-Administered Medications Ordered in Other Encounters  Medication Dose Route Frequency Provider Last Rate Last Admin  . cloNIDine (CATAPRES) tablet 0.1 mg  0.1 mg Oral Daily Harle Stanford., PA-C   0.1 mg at 11/21/19 1742   BP 140/80   Pulse 75   Wt 84.6 kg (186 lb 9.6 oz)   LMP 05/18/2016   SpO2 99%   BMI 37.69 kg/m  General: NAD Neck: No JVD, no thyromegaly or thyroid nodule.  Lungs: Clear to auscultation bilaterally with normal respiratory effort. CV: Nondisplaced PMI.  Heart regular S1/S2, no S3/S4, no murmur.  No peripheral edema.  No carotid bruit.  Normal pedal pulses.  Abdomen: Soft, nontender, no hepatosplenomegaly, no distention.  Skin: Intact without lesions or rashes.  Neurologic: Alert and oriented x 3.  Psych: Normal affect. Extremities: No clubbing or cyanosis.  HEENT: Normal.   Assessment/Plan: 1. Chemotherapy-related cardiomyopathy: Fall in EF and strain in the setting of Herceptin use.  Echo in 4/21 with EF 45-50% with GLS -12.6%.  Herceptin was held, Coreg started, and echo in 6/21 showed EF up to 55-60% with GLS -14.4% => Herceptin restarted.  Echo in 7/21 with EF 55-60%, GLS more negative at -16.9%.  Echo today and in 9/21 showed normal EF but less negative strain.  However, strain images did not appear to track well on either echo.   NYHA class II symptoms.  She does not appear volume overloaded on exam.  Main problem seems to be neuropathy.  - Restart Coreg 6.25 mg bid and telmisartan.  - Restart Lasix 40 mg daily and KCl 20 daily.  - Repeat echo at 4 months.  2. HTN: BP mildly elevated. No changes for now.  3. Hyperlipidemia: Keep LDL at least < 100 with diabetes.  - Continue pravastatin.   4. Chest pain: Now resolved.  Coronary CTA in 9/21 was normal.  5. DVT: on left.   - Continue Xarelto long-term given DVT in setting of malignancy (restart today).   Followup in 3 months with echo.   Loralie Champagne 11/23/2020

## 2020-11-26 ENCOUNTER — Encounter: Payer: Self-pay | Admitting: Neurology

## 2020-11-28 ENCOUNTER — Telehealth: Payer: Self-pay

## 2020-11-28 ENCOUNTER — Ambulatory Visit (HOSPITAL_COMMUNITY)
Admission: RE | Admit: 2020-11-28 | Discharge: 2020-11-28 | Disposition: A | Discharge status: Home / Self Care | Payer: 59 | Source: Ambulatory Visit | Attending: Oncology | Admitting: Oncology

## 2020-11-28 ENCOUNTER — Other Ambulatory Visit: Payer: Self-pay

## 2020-11-28 DIAGNOSIS — G1220 Motor neuron disease, unspecified: Secondary | ICD-10-CM

## 2020-11-28 DIAGNOSIS — G249 Dystonia, unspecified: Secondary | ICD-10-CM | POA: Diagnosis present

## 2020-11-28 DIAGNOSIS — G510 Bell's palsy: Secondary | ICD-10-CM | POA: Diagnosis present

## 2020-11-28 MED ORDER — GADOBUTROL 1 MMOL/ML IV SOLN
8.0000 mL | Freq: Once | INTRAVENOUS | Status: AC | PRN
  Administered 2020-11-28: 8 mL via INTRAVENOUS

## 2020-11-28 NOTE — Telephone Encounter (Signed)
RN returned call, voicemail left for return call.  

## 2020-11-28 NOTE — Telephone Encounter (Signed)
Pt called to inquire when her next treatment needs to be.   RN reviewed chart, patient missed recent infusion appointment.    Pt scheduled for imaging 2/10.   MD recommendations for a virtual visit on 2/11 for scan results, at that time LOS to be sent based on recommendations for treatment.  Pt scheduled for 2/11 @ 3pm - pt aware.

## 2020-11-29 ENCOUNTER — Encounter: Payer: Self-pay | Admitting: Adult Health

## 2020-11-29 ENCOUNTER — Inpatient Hospital Stay: Payer: 59 | Attending: Adult Health | Admitting: Adult Health

## 2020-11-29 ENCOUNTER — Telehealth (HOSPITAL_COMMUNITY): Payer: Self-pay | Admitting: Adult Health

## 2020-11-29 DIAGNOSIS — Z7901 Long term (current) use of anticoagulants: Secondary | ICD-10-CM | POA: Insufficient documentation

## 2020-11-29 DIAGNOSIS — Z90710 Acquired absence of both cervix and uterus: Secondary | ICD-10-CM | POA: Insufficient documentation

## 2020-11-29 DIAGNOSIS — Z86718 Personal history of other venous thrombosis and embolism: Secondary | ICD-10-CM | POA: Insufficient documentation

## 2020-11-29 DIAGNOSIS — Z9079 Acquired absence of other genital organ(s): Secondary | ICD-10-CM | POA: Insufficient documentation

## 2020-11-29 DIAGNOSIS — I10 Essential (primary) hypertension: Secondary | ICD-10-CM | POA: Insufficient documentation

## 2020-11-29 DIAGNOSIS — Z801 Family history of malignant neoplasm of trachea, bronchus and lung: Secondary | ICD-10-CM | POA: Insufficient documentation

## 2020-11-29 DIAGNOSIS — Z90722 Acquired absence of ovaries, bilateral: Secondary | ICD-10-CM | POA: Insufficient documentation

## 2020-11-29 DIAGNOSIS — C50112 Malignant neoplasm of central portion of left female breast: Secondary | ICD-10-CM

## 2020-11-29 DIAGNOSIS — C50412 Malignant neoplasm of upper-outer quadrant of left female breast: Secondary | ICD-10-CM | POA: Insufficient documentation

## 2020-11-29 DIAGNOSIS — Z8542 Personal history of malignant neoplasm of other parts of uterus: Secondary | ICD-10-CM | POA: Insufficient documentation

## 2020-11-29 DIAGNOSIS — Z5112 Encounter for antineoplastic immunotherapy: Secondary | ICD-10-CM | POA: Insufficient documentation

## 2020-11-29 DIAGNOSIS — Z803 Family history of malignant neoplasm of breast: Secondary | ICD-10-CM | POA: Insufficient documentation

## 2020-11-29 DIAGNOSIS — Z8249 Family history of ischemic heart disease and other diseases of the circulatory system: Secondary | ICD-10-CM | POA: Insufficient documentation

## 2020-11-29 DIAGNOSIS — C773 Secondary and unspecified malignant neoplasm of axilla and upper limb lymph nodes: Secondary | ICD-10-CM | POA: Insufficient documentation

## 2020-11-29 DIAGNOSIS — Z5329 Procedure and treatment not carried out because of patient's decision for other reasons: Secondary | ICD-10-CM

## 2020-11-29 DIAGNOSIS — Z833 Family history of diabetes mellitus: Secondary | ICD-10-CM | POA: Insufficient documentation

## 2020-11-29 DIAGNOSIS — Z79899 Other long term (current) drug therapy: Secondary | ICD-10-CM | POA: Insufficient documentation

## 2020-11-29 DIAGNOSIS — Z170 Estrogen receptor positive status [ER+]: Secondary | ICD-10-CM | POA: Insufficient documentation

## 2020-11-29 NOTE — Telephone Encounter (Signed)
I spoke with [name] briefly about her missed appointment.  She apologized and noted the time had gotten away from her.  I reviewed her MRI results with her which show no evidence of cancer in her brain. She is thankful for this, and still wants to know more information about her neuropathy.  I sent her information to Dr. Mickeal Skinner to gain whether or not he has any recommendations or considerations.  [name] is changing her neurology team and will not f/u with anyone until April.    I sent a schedule message for her to see Dr. Jana Hakim on 2/16 at 1130, as she would like to hear his thoughts about this as well.   Jacqueline Bihari, NP

## 2020-11-29 NOTE — Progress Notes (Signed)
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  (a) new baseline PET scan 07/01/2020 shows significant supraclavicular, mediastinal and prevascular adenopathy with new small left pleural and pericardial effusions  (b) echo 06/28/2020 shows and ejection fraction in the 55-60% range  (c) PET scan on 08/15/2020 shows interval response to chemotherapy with decrease in size and SUV of lymphadenopathy, no new or progressive disease identified in abdomen, pelvis, or bones  (d) switched back to Herceptin as of 10/24/2020 secondary to patient's concerns regarding possible neuropathy  (e) repeat echocardiography 11/21/2020  (14) adjuvant radiation being considered depending on overall response  (16) left lower extremity DVT documented by Doppler ultrasound 05/31/2020  (a) rivaroxaban/Xarelto started 05/31/2020  (17) osteoarthritis/ degenerative disease  (a) cervical spine MRI 09/08/2020 showed a herniated nucleus pulposus at cervical 4/5, no evidence of metastatic disease within the cervical spine  (b) lumbar MRI 09/27/2020 showed significant degenerative disease, no evidence of metastasis  (c)  status post anterior cervical decompression C4/5 with arthrodesis 10/02/2020 ([facility])  (18) bilateral carpal tunnel syndrome documented by electromyography 08/05/2020, severe on the right, moderate on the left Krista Blue)   PLAN: Levada Dy DID NOT SHOW FOR HER APPOINTMENT.  A LETTER HAS BEEN MAILED.    Wilber Bihari, NP 11/29/20 3:15 PM Medical Oncology and Hematology [facility] [place], [place] [address] Tel. [phone]    Fax. [phone]     *Total Encounter Time as defined by the Centers for Medicare and Medicaid Services includes, in addition to the face-to-face time of a patient visit (documented in the note above) non-face-to-face time: obtaining and reviewing outside history, ordering and reviewing medications, tests or procedures, care coordination (communications with other health care professionals or caregivers) and documentation in the medical record.

## 2020-12-02 ENCOUNTER — Telehealth: Payer: Self-pay | Admitting: Oncology

## 2020-12-02 NOTE — Telephone Encounter (Signed)
Scheduled appt per 2/11 sch msg - pt is aware of appts on 2/16 and is aware of the gam in between infusion and f/u . She will call back if she can not stay and reschedule.

## 2020-12-04 ENCOUNTER — Inpatient Hospital Stay: Payer: 59

## 2020-12-04 ENCOUNTER — Other Ambulatory Visit: Payer: Self-pay

## 2020-12-04 ENCOUNTER — Inpatient Hospital Stay: Payer: 59 | Admitting: Oncology

## 2020-12-04 VITALS — BP 126/85 | HR 70 | Temp 97.3°F | Resp 18 | Ht 59.0 in | Wt 187.5 lb

## 2020-12-04 DIAGNOSIS — C50812 Malignant neoplasm of overlapping sites of left female breast: Secondary | ICD-10-CM

## 2020-12-04 DIAGNOSIS — Z5112 Encounter for antineoplastic immunotherapy: Secondary | ICD-10-CM | POA: Diagnosis present

## 2020-12-04 DIAGNOSIS — Z79899 Other long term (current) drug therapy: Secondary | ICD-10-CM | POA: Diagnosis not present

## 2020-12-04 DIAGNOSIS — C50412 Malignant neoplasm of upper-outer quadrant of left female breast: Secondary | ICD-10-CM | POA: Diagnosis present

## 2020-12-04 DIAGNOSIS — Z8249 Family history of ischemic heart disease and other diseases of the circulatory system: Secondary | ICD-10-CM | POA: Diagnosis not present

## 2020-12-04 DIAGNOSIS — C541 Malignant neoplasm of endometrium: Secondary | ICD-10-CM | POA: Diagnosis not present

## 2020-12-04 DIAGNOSIS — Z171 Estrogen receptor negative status [ER-]: Secondary | ICD-10-CM

## 2020-12-04 DIAGNOSIS — C771 Secondary and unspecified malignant neoplasm of intrathoracic lymph nodes: Secondary | ICD-10-CM

## 2020-12-04 DIAGNOSIS — I10 Essential (primary) hypertension: Secondary | ICD-10-CM | POA: Diagnosis not present

## 2020-12-04 DIAGNOSIS — Z801 Family history of malignant neoplasm of trachea, bronchus and lung: Secondary | ICD-10-CM | POA: Diagnosis not present

## 2020-12-04 DIAGNOSIS — Z833 Family history of diabetes mellitus: Secondary | ICD-10-CM | POA: Diagnosis not present

## 2020-12-04 DIAGNOSIS — Z86718 Personal history of other venous thrombosis and embolism: Secondary | ICD-10-CM | POA: Diagnosis not present

## 2020-12-04 DIAGNOSIS — Z7901 Long term (current) use of anticoagulants: Secondary | ICD-10-CM | POA: Diagnosis not present

## 2020-12-04 DIAGNOSIS — Z95828 Presence of other vascular implants and grafts: Secondary | ICD-10-CM

## 2020-12-04 DIAGNOSIS — C50112 Malignant neoplasm of central portion of left female breast: Secondary | ICD-10-CM

## 2020-12-04 DIAGNOSIS — Z90722 Acquired absence of ovaries, bilateral: Secondary | ICD-10-CM | POA: Diagnosis not present

## 2020-12-04 DIAGNOSIS — Z9079 Acquired absence of other genital organ(s): Secondary | ICD-10-CM | POA: Diagnosis not present

## 2020-12-04 DIAGNOSIS — Z8542 Personal history of malignant neoplasm of other parts of uterus: Secondary | ICD-10-CM | POA: Diagnosis not present

## 2020-12-04 DIAGNOSIS — K760 Fatty (change of) liver, not elsewhere classified: Secondary | ICD-10-CM

## 2020-12-04 DIAGNOSIS — Z170 Estrogen receptor positive status [ER+]: Secondary | ICD-10-CM | POA: Diagnosis not present

## 2020-12-04 DIAGNOSIS — Z90710 Acquired absence of both cervix and uterus: Secondary | ICD-10-CM | POA: Diagnosis not present

## 2020-12-04 DIAGNOSIS — Z7189 Other specified counseling: Secondary | ICD-10-CM

## 2020-12-04 DIAGNOSIS — C773 Secondary and unspecified malignant neoplasm of axilla and upper limb lymph nodes: Secondary | ICD-10-CM | POA: Diagnosis present

## 2020-12-04 DIAGNOSIS — Z803 Family history of malignant neoplasm of breast: Secondary | ICD-10-CM | POA: Diagnosis not present

## 2020-12-04 LAB — CBC WITH DIFFERENTIAL (CANCER CENTER ONLY)
Abs Immature Granulocytes: 0.05 10*3/uL (ref 0.00–0.07)
Basophils Absolute: 0 10*3/uL (ref 0.0–0.1)
Basophils Relative: 0 %
Eosinophils Absolute: 0 10*3/uL (ref 0.0–0.5)
Eosinophils Relative: 1 %
HCT: 31.7 % — ABNORMAL LOW (ref 36.0–46.0)
Hemoglobin: 10.1 g/dL — ABNORMAL LOW (ref 12.0–15.0)
Immature Granulocytes: 1 %
Lymphocytes Relative: 25 %
Lymphs Abs: 1.5 10*3/uL (ref 0.7–4.0)
MCH: 30.1 pg (ref 26.0–34.0)
MCHC: 31.9 g/dL (ref 30.0–36.0)
MCV: 94.3 fL (ref 80.0–100.0)
Monocytes Absolute: 0.6 10*3/uL (ref 0.1–1.0)
Monocytes Relative: 10 %
Neutro Abs: 3.8 10*3/uL (ref 1.7–7.7)
Neutrophils Relative %: 63 %
Platelet Count: 181 10*3/uL (ref 150–400)
RBC: 3.36 MIL/uL — ABNORMAL LOW (ref 3.87–5.11)
RDW: 14.9 % (ref 11.5–15.5)
WBC Count: 6.1 10*3/uL (ref 4.0–10.5)
nRBC: 0 % (ref 0.0–0.2)

## 2020-12-04 LAB — CMP (CANCER CENTER ONLY)
ALT: 15 U/L (ref 0–44)
AST: 21 U/L (ref 15–41)
Albumin: 3.4 g/dL — ABNORMAL LOW (ref 3.5–5.0)
Alkaline Phosphatase: 39 U/L (ref 38–126)
Anion gap: 10 (ref 5–15)
BUN: 22 mg/dL (ref 8–23)
CO2: 28 mmol/L (ref 22–32)
Calcium: 9.8 mg/dL (ref 8.9–10.3)
Chloride: 104 mmol/L (ref 98–111)
Creatinine: 1.17 mg/dL — ABNORMAL HIGH (ref 0.44–1.00)
GFR, Estimated: 53 mL/min — ABNORMAL LOW (ref >60)
Glucose, Bld: 107 mg/dL — ABNORMAL HIGH (ref 70–99)
Potassium: 3.7 mmol/L (ref 3.5–5.1)
Sodium: 142 mmol/L (ref 135–145)
Total Bilirubin: 0.5 mg/dL (ref 0.3–1.2)
Total Protein: 7.9 g/dL (ref 6.5–8.1)

## 2020-12-04 MED ORDER — SODIUM CHLORIDE 0.9 % IV SOLN
6.0000 mg/kg | Freq: Once | INTRAVENOUS | Status: AC
  Administered 2020-12-04: 525 mg via INTRAVENOUS
  Filled 2020-12-04: qty 25

## 2020-12-04 MED ORDER — HEPARIN SOD (PORK) LOCK FLUSH 100 UNIT/ML IV SOLN
500.0000 [IU] | Freq: Once | INTRAVENOUS | Status: AC | PRN
  Administered 2020-12-04: 500 [IU]
  Filled 2020-12-04: qty 5

## 2020-12-04 MED ORDER — SODIUM CHLORIDE 0.9 % IV SOLN
Freq: Once | INTRAVENOUS | Status: AC
  Filled 2020-12-04: qty 250

## 2020-12-04 MED ORDER — SODIUM CHLORIDE 0.9% FLUSH
10.0000 mL | INTRAVENOUS | Status: DC | PRN
  Administered 2020-12-04: 10 mL
  Filled 2020-12-04: qty 10

## 2020-12-04 MED ORDER — DIPHENHYDRAMINE HCL 25 MG PO CAPS
25.0000 mg | ORAL_CAPSULE | Freq: Once | ORAL | Status: AC
  Administered 2020-12-04: 25 mg via ORAL

## 2020-12-04 MED ORDER — ACETAMINOPHEN 325 MG PO TABS
ORAL_TABLET | ORAL | Status: AC
  Filled 2020-12-04: qty 2

## 2020-12-04 MED ORDER — ACETAMINOPHEN 325 MG PO TABS
650.0000 mg | ORAL_TABLET | Freq: Once | ORAL | Status: AC
  Administered 2020-12-04: 650 mg via ORAL

## 2020-12-04 MED ORDER — SODIUM CHLORIDE 0.9% FLUSH
10.0000 mL | Freq: Once | INTRAVENOUS | Status: AC
  Administered 2020-12-04: 10 mL
  Filled 2020-12-04: qty 10

## 2020-12-04 MED ORDER — GABAPENTIN 300 MG PO CAPS: 300.0000 mg | ORAL_CAPSULE | Freq: Three times a day (TID) | ORAL | 6 refills | Status: DC

## 2020-12-04 MED ORDER — DIPHENHYDRAMINE HCL 25 MG PO CAPS
ORAL_CAPSULE | ORAL | Status: AC
  Filled 2020-12-04: qty 1

## 2020-12-04 NOTE — Patient Instructions (Signed)
[facility] Discharge Instructions for Patients Receiving Chemotherapy  Today you received the following chemotherapy agents Trastuzumab (Kanjinti)  To help prevent nausea and vomiting after your treatment, we encourage you to take your nausea medication as directed.    If you develop nausea and vomiting that is not controlled by your nausea medication, call the clinic.   BELOW ARE SYMPTOMS THAT SHOULD BE REPORTED IMMEDIATELY:  *FEVER GREATER THAN 100.5 F  *CHILLS WITH OR WITHOUT FEVER  NAUSEA AND VOMITING THAT IS NOT CONTROLLED WITH YOUR NAUSEA MEDICATION  *UNUSUAL SHORTNESS OF BREATH  *UNUSUAL BRUISING OR BLEEDING  TENDERNESS IN MOUTH AND THROAT WITH OR WITHOUT PRESENCE OF ULCERS  *URINARY PROBLEMS  *BOWEL PROBLEMS  UNUSUAL RASH Items with * indicate a potential emergency and should be followed up as soon as possible.  Feel free to call the clinic should you have any questions or concerns. The clinic phone number is [phone].  Please show the [place] at check-in to the Emergency Department and triage nurse.

## 2020-12-04 NOTE — Progress Notes (Signed)
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 axillary lymph node.  There is a left subpectoral lymph node and a left internal mammary lymph node noted as well.  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  (b) pertuzumab held with cycle 2 because of persistent diarrhea  (c) anti-HER2 therapy held after cycle 4 because of a drop in EF  (d) carbo/gemzar stopped after cycle 5, last dose 02/13/2020  (13) left breast lumpectomy and axillary node dissection on 04/10/2020 showed a residual ypT0 ypN1    (a) 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  (b) a total of 2 left axillary lymph nodes removed, one positive  (14) anti-HER-2 treatment resumed after surgery to be continued indefinitely  (a) echo 11/09/2019 shows an  ejection fraction in the 60-65% range  (b) echo 02/09/2020 shows an ejection fraction in the 45 to 50% range  (c) echo 03/26/2020 shows an ejection fraction in the 55-60% range  (d) trastuzumab resumed 03/28/2020  (e) echo 05/06/2020 shows an ejection fraction in the 55-60% range  (f) trastuzumab discontinued after 06/20/2020 dose with progression  (14) switched to TDM-1/Kadcyla starting 07/11/2020  (a) new baseline PET scan 07/01/2020 shows significant supraclavicular, mediastinal and prevascular adenopathy with new small left pleural and pericardial effusions  (b) echo 06/28/2020 shows and ejection fraction in the 55-60% range  (  c) PET scan on 08/15/2020 shows interval response to chemotherapy with decrease in size and SUV of lymphadenopathy, no new or progressive disease identified in abdomen, pelvis, or bones  (d) switched back to Herceptin as of 10/24/2020 secondary to patient's concerns regarding possible neuropathy  (e) repeat echocardiography 11/21/2020 shows an ejection fraction in the 55% range  (14) adjuvant radiation being considered depending on overall response  (16) left lower extremity DVT documented by Doppler ultrasound 05/31/2020  (a) rivaroxaban/Xarelto started 05/31/2020  (17) osteoarthritis/ degenerative disease  (a) cervical spine MRI 09/08/2020 showed a herniated nucleus pulposus at cervical 4/5, no evidence of metastatic disease within the cervical spine  (b) lumbar MRI 09/27/2020 showed significant degenerative disease, no evidence of metastasis  (c) status post anterior cervical decompression C4/5 with arthrodesis 10/02/2020 ([facility])  (18) bilateral carpal tunnel syndrome documented by electromyography 08/05/2020, severe on the right, moderate on the left Krista Blue)   PLAN: Cali is now a little over a year out from definitive diagnosis of metastatic breast cancer.  The only treatment she is receiving for this is trastuzumab.  We just did an echocardiogram which  shows an ejection fraction in the 55% range.  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  She has seen Ortho and neurosurgery as well as neurology for this.  She has an appointment with Dr. Posey Pronto in neurology for April.  Her pain is now controlled with gabapentin 3 times daily which she wanted me to refill, and I was glad to do.  She also takes oxycodone.  She gets variably constipated with that but knows how to take care of it.  I do not know if she would benefit from physical therapy but I have gone ahead and placed a referral for her.  From a breast cancer point of view the plan is to repeat a PET scan early April.  She will see me February 05, 2021 to review those results.  Of course we will continue to follow her ejection fraction closely given her prior problems with anti-HER-2 medication  She knows to call for any other issue that may develop before the next visit.  Total encounter time 35 minutes.*   Gstav C. Traquan Duarte, MD 12/04/20 11:42 AM Medical Oncology and Hematology [facility] [place], [place] [address] Tel. [phone]    Fax. [phone]   I, Wilburn Mylar, am acting as scribe for Dr. [name]. Radley Teston.  I, Lurline Del MD, have reviewed the above documentation for accuracy and completeness, and I agree with the above.   *Total Encounter Time as defined by the Centers for Medicare and Medicaid Services includes, in addition to the face-to-face time of a patient visit (documented in the note above) non-face-to-face time: obtaining and reviewing outside history, ordering and reviewing medications, tests or procedures, care coordination (communications with other health care professionals or caregivers) and documentation in the medical record.

## 2020-12-04 NOTE — Patient Instructions (Signed)

## 2020-12-05 ENCOUNTER — Telehealth: Payer: Self-pay | Admitting: Oncology

## 2020-12-05 NOTE — Telephone Encounter (Signed)
Scheduled appts per 2/16 los. Pt confirmed appt dates/times.

## 2020-12-09 ENCOUNTER — Telehealth: Payer: Self-pay | Admitting: *Deleted

## 2020-12-09 NOTE — Telephone Encounter (Signed)
Message left for [name] ([phone]) regarding her message inquiring about medical records release to [facility] disability claim needed by the end of the month.  Form faxed today.  R.O.I. with completed form to [place] marked urgent for [facility] to send records from August through September 23, 2020.

## 2020-12-11 ENCOUNTER — Ambulatory Visit: Payer: 59 | Admitting: Physical Therapy

## 2020-12-17 ENCOUNTER — Other Ambulatory Visit: Payer: Self-pay | Admitting: *Deleted

## 2020-12-17 ENCOUNTER — Other Ambulatory Visit: Payer: Self-pay

## 2020-12-17 ENCOUNTER — Ambulatory Visit: Payer: 59 | Attending: Oncology | Admitting: Rehabilitation

## 2020-12-17 DIAGNOSIS — M25642 Stiffness of left hand, not elsewhere classified: Secondary | ICD-10-CM | POA: Insufficient documentation

## 2020-12-17 DIAGNOSIS — M6281 Muscle weakness (generalized): Secondary | ICD-10-CM | POA: Insufficient documentation

## 2020-12-17 DIAGNOSIS — M79641 Pain in right hand: Secondary | ICD-10-CM | POA: Insufficient documentation

## 2020-12-17 DIAGNOSIS — R2681 Unsteadiness on feet: Secondary | ICD-10-CM | POA: Insufficient documentation

## 2020-12-17 DIAGNOSIS — G5602 Carpal tunnel syndrome, left upper limb: Secondary | ICD-10-CM

## 2020-12-17 DIAGNOSIS — M25641 Stiffness of right hand, not elsewhere classified: Secondary | ICD-10-CM | POA: Insufficient documentation

## 2020-12-17 DIAGNOSIS — G5603 Carpal tunnel syndrome, bilateral upper limbs: Secondary | ICD-10-CM

## 2020-12-17 DIAGNOSIS — M79642 Pain in left hand: Secondary | ICD-10-CM | POA: Insufficient documentation

## 2020-12-17 NOTE — Therapy (Signed)
PT was referred for hand contracture and difficulty with hand dexterity due to carpal tunnel vs cervical reasons. Disvcussed switch to hand therapy/OT at neurorehab to get better treatment with pt agreeable. Note sent to Dr. Jana Hakim for OT referral. Will keep PT referral open in case pt also wants PT there which she is considering.   Shan Levans, PT

## 2020-12-19 ENCOUNTER — Telehealth: Payer: Self-pay

## 2020-12-19 NOTE — Telephone Encounter (Signed)
Called pt to let her know that an OT referral had been made for her. Pt verbalized understanding

## 2020-12-25 ENCOUNTER — Other Ambulatory Visit: Payer: Self-pay

## 2020-12-25 ENCOUNTER — Inpatient Hospital Stay: Payer: 59 | Attending: Oncology

## 2020-12-25 ENCOUNTER — Inpatient Hospital Stay: Payer: 59

## 2020-12-25 VITALS — BP 134/61 | HR 64 | Temp 98.5°F | Resp 18

## 2020-12-25 DIAGNOSIS — C773 Secondary and unspecified malignant neoplasm of axilla and upper limb lymph nodes: Secondary | ICD-10-CM | POA: Diagnosis present

## 2020-12-25 DIAGNOSIS — C50812 Malignant neoplasm of overlapping sites of left female breast: Secondary | ICD-10-CM

## 2020-12-25 DIAGNOSIS — Z171 Estrogen receptor negative status [ER-]: Secondary | ICD-10-CM

## 2020-12-25 DIAGNOSIS — C50412 Malignant neoplasm of upper-outer quadrant of left female breast: Secondary | ICD-10-CM | POA: Insufficient documentation

## 2020-12-25 DIAGNOSIS — C50112 Malignant neoplasm of central portion of left female breast: Secondary | ICD-10-CM

## 2020-12-25 DIAGNOSIS — Z170 Estrogen receptor positive status [ER+]: Secondary | ICD-10-CM | POA: Insufficient documentation

## 2020-12-25 DIAGNOSIS — Z5112 Encounter for antineoplastic immunotherapy: Secondary | ICD-10-CM | POA: Diagnosis present

## 2020-12-25 DIAGNOSIS — Z95828 Presence of other vascular implants and grafts: Secondary | ICD-10-CM

## 2020-12-25 DIAGNOSIS — C771 Secondary and unspecified malignant neoplasm of intrathoracic lymph nodes: Secondary | ICD-10-CM

## 2020-12-25 DIAGNOSIS — Z8542 Personal history of malignant neoplasm of other parts of uterus: Secondary | ICD-10-CM | POA: Diagnosis not present

## 2020-12-25 LAB — CBC WITH DIFFERENTIAL (CANCER CENTER ONLY)
Abs Immature Granulocytes: 0.02 10*3/uL (ref 0.00–0.07)
Basophils Absolute: 0 10*3/uL (ref 0.0–0.1)
Basophils Relative: 1 %
Eosinophils Absolute: 0.1 10*3/uL (ref 0.0–0.5)
Eosinophils Relative: 2 %
HCT: 29 % — ABNORMAL LOW (ref 36.0–46.0)
Hemoglobin: 9.2 g/dL — ABNORMAL LOW (ref 12.0–15.0)
Immature Granulocytes: 0 %
Lymphocytes Relative: 29 %
Lymphs Abs: 1.6 10*3/uL (ref 0.7–4.0)
MCH: 29.9 pg (ref 26.0–34.0)
MCHC: 31.7 g/dL (ref 30.0–36.0)
MCV: 94.2 fL (ref 80.0–100.0)
Monocytes Absolute: 0.7 10*3/uL (ref 0.1–1.0)
Monocytes Relative: 12 %
Neutro Abs: 3 10*3/uL (ref 1.7–7.7)
Neutrophils Relative %: 56 %
Platelet Count: 165 10*3/uL (ref 150–400)
RBC: 3.08 MIL/uL — ABNORMAL LOW (ref 3.87–5.11)
RDW: 14.6 % (ref 11.5–15.5)
WBC Count: 5.4 10*3/uL (ref 4.0–10.5)
nRBC: 0 % (ref 0.0–0.2)

## 2020-12-25 LAB — CMP (CANCER CENTER ONLY)
ALT: 10 U/L (ref 0–44)
AST: 16 U/L (ref 15–41)
Albumin: 3.2 g/dL — ABNORMAL LOW (ref 3.5–5.0)
Alkaline Phosphatase: 36 U/L — ABNORMAL LOW (ref 38–126)
Anion gap: 7 (ref 5–15)
BUN: 25 mg/dL — ABNORMAL HIGH (ref 8–23)
CO2: 28 mmol/L (ref 22–32)
Calcium: 9.5 mg/dL (ref 8.9–10.3)
Chloride: 107 mmol/L (ref 98–111)
Creatinine: 1.08 mg/dL — ABNORMAL HIGH (ref 0.44–1.00)
GFR, Estimated: 58 mL/min — ABNORMAL LOW (ref >60)
Glucose, Bld: 91 mg/dL (ref 70–99)
Potassium: 3.5 mmol/L (ref 3.5–5.1)
Sodium: 142 mmol/L (ref 135–145)
Total Bilirubin: 0.5 mg/dL (ref 0.3–1.2)
Total Protein: 7.8 g/dL (ref 6.5–8.1)

## 2020-12-25 MED ORDER — DIPHENHYDRAMINE HCL 25 MG PO CAPS
ORAL_CAPSULE | ORAL | Status: AC
  Filled 2020-12-25: qty 1

## 2020-12-25 MED ORDER — TRASTUZUMAB-ANNS CHEMO 150 MG IV SOLR
6.0000 mg/kg | Freq: Once | INTRAVENOUS | Status: AC
  Administered 2020-12-25: 525 mg via INTRAVENOUS
  Filled 2020-12-25: qty 25

## 2020-12-25 MED ORDER — IBUPROFEN 200 MG PO TABS: 200.0000 mg | ORAL_TABLET | ORAL | Status: DC | PRN

## 2020-12-25 MED ORDER — SODIUM CHLORIDE 0.9 % IV SOLN
Freq: Once | INTRAVENOUS | Status: AC
  Filled 2020-12-25: qty 250

## 2020-12-25 MED ORDER — SODIUM CHLORIDE 0.9% FLUSH
10.0000 mL | Freq: Once | INTRAVENOUS | Status: AC
  Administered 2020-12-25: 10 mL
  Filled 2020-12-25: qty 10

## 2020-12-25 MED ORDER — GABAPENTIN 300 MG PO CAPS: 300.0000 mg | ORAL_CAPSULE | Freq: Three times a day (TID) | ORAL | 6 refills | Status: DC

## 2020-12-25 MED ORDER — ACETAMINOPHEN 325 MG PO TABS
650.0000 mg | ORAL_TABLET | Freq: Once | ORAL | Status: AC
  Administered 2020-12-25: 650 mg via ORAL

## 2020-12-25 MED ORDER — ACETAMINOPHEN 325 MG PO TABS
ORAL_TABLET | ORAL | Status: AC
  Filled 2020-12-25: qty 2

## 2020-12-25 MED ORDER — SODIUM CHLORIDE 0.9% FLUSH
10.0000 mL | INTRAVENOUS | Status: DC | PRN
  Administered 2020-12-25: 10 mL
  Filled 2020-12-25: qty 10

## 2020-12-25 MED ORDER — HEPARIN SOD (PORK) LOCK FLUSH 100 UNIT/ML IV SOLN
500.0000 [IU] | Freq: Once | INTRAVENOUS | Status: AC | PRN
  Administered 2020-12-25: 500 [IU]
  Filled 2020-12-25: qty 5

## 2020-12-25 MED ORDER — DIPHENHYDRAMINE HCL 25 MG PO CAPS
25.0000 mg | ORAL_CAPSULE | Freq: Once | ORAL | Status: AC
  Administered 2020-12-25: 25 mg via ORAL

## 2020-12-25 NOTE — Patient Instructions (Signed)
[facility] Discharge Instructions for Patients Receiving Chemotherapy  Today you received the following chemotherapy agents: Kanjinti  To help prevent nausea and vomiting after your treatment, we encourage you to take your nausea medication as directed.    If you develop nausea and vomiting that is not controlled by your nausea medication, call the clinic.   BELOW ARE SYMPTOMS THAT SHOULD BE REPORTED IMMEDIATELY:  *FEVER GREATER THAN 100.5 F  *CHILLS WITH OR WITHOUT FEVER  NAUSEA AND VOMITING THAT IS NOT CONTROLLED WITH YOUR NAUSEA MEDICATION  *UNUSUAL SHORTNESS OF BREATH  *UNUSUAL BRUISING OR BLEEDING  TENDERNESS IN MOUTH AND THROAT WITH OR WITHOUT PRESENCE OF ULCERS  *URINARY PROBLEMS  *BOWEL PROBLEMS  UNUSUAL RASH Items with * indicate a potential emergency and should be followed up as soon as possible.  Feel free to call the clinic should you have any questions or concerns. The clinic phone number is [phone].  Please show the CHEMO ALERT CARD at check-in to the Emergency Department and triage nurse.   

## 2020-12-25 NOTE — Patient Instructions (Signed)

## 2020-12-26 ENCOUNTER — Other Ambulatory Visit: Payer: Self-pay

## 2020-12-26 ENCOUNTER — Ambulatory Visit: Payer: 59 | Admitting: Occupational Therapy

## 2020-12-26 ENCOUNTER — Encounter: Payer: Self-pay | Admitting: Occupational Therapy

## 2020-12-26 ENCOUNTER — Other Ambulatory Visit: Payer: Self-pay | Admitting: Oncology

## 2020-12-26 DIAGNOSIS — C50112 Malignant neoplasm of central portion of left female breast: Secondary | ICD-10-CM

## 2020-12-26 DIAGNOSIS — M25642 Stiffness of left hand, not elsewhere classified: Secondary | ICD-10-CM | POA: Diagnosis present

## 2020-12-26 DIAGNOSIS — M79641 Pain in right hand: Secondary | ICD-10-CM | POA: Diagnosis present

## 2020-12-26 DIAGNOSIS — R2681 Unsteadiness on feet: Secondary | ICD-10-CM

## 2020-12-26 DIAGNOSIS — M6281 Muscle weakness (generalized): Secondary | ICD-10-CM

## 2020-12-26 DIAGNOSIS — G5603 Carpal tunnel syndrome, bilateral upper limbs: Secondary | ICD-10-CM | POA: Diagnosis not present

## 2020-12-26 DIAGNOSIS — M25641 Stiffness of right hand, not elsewhere classified: Secondary | ICD-10-CM | POA: Diagnosis present

## 2020-12-26 DIAGNOSIS — M79642 Pain in left hand: Secondary | ICD-10-CM | POA: Diagnosis present
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
## 2021-01-01 ENCOUNTER — Other Ambulatory Visit: Payer: Self-pay | Admitting: Oncology

## 2021-01-01 ENCOUNTER — Telehealth: Payer: Self-pay

## 2021-01-01 ENCOUNTER — Other Ambulatory Visit: Payer: Self-pay

## 2021-01-01 ENCOUNTER — Encounter: Payer: Self-pay | Admitting: Occupational Therapy

## 2021-01-01 ENCOUNTER — Ambulatory Visit: Payer: 59 | Admitting: Occupational Therapy

## 2021-01-01 DIAGNOSIS — M79641 Pain in right hand: Secondary | ICD-10-CM

## 2021-01-01 DIAGNOSIS — R2681 Unsteadiness on feet: Secondary | ICD-10-CM

## 2021-01-01 DIAGNOSIS — M6281 Muscle weakness (generalized): Secondary | ICD-10-CM

## 2021-01-01 DIAGNOSIS — M25642 Stiffness of left hand, not elsewhere classified: Secondary | ICD-10-CM

## 2021-01-01 DIAGNOSIS — G5603 Carpal tunnel syndrome, bilateral upper limbs: Secondary | ICD-10-CM | POA: Diagnosis not present

## 2021-01-01 DIAGNOSIS — M25641 Stiffness of right hand, not elsewhere classified: Secondary | ICD-10-CM

## 2021-01-01 DIAGNOSIS — M79642 Pain in left hand: Secondary | ICD-10-CM

## 2021-01-01 DIAGNOSIS — C50112 Malignant neoplasm of central portion of left female breast: Secondary | ICD-10-CM
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
## 2021-01-01 NOTE — Telephone Encounter (Signed)
Returned call to pt / left message to verify that she would be seeing MD at next [facility] appointment .

## 2021-01-08 ENCOUNTER — Other Ambulatory Visit: Payer: Self-pay

## 2021-01-08 DIAGNOSIS — M21379 Foot drop, unspecified foot: Secondary | ICD-10-CM

## 2021-01-08 DIAGNOSIS — R2689 Other abnormalities of gait and mobility: Secondary | ICD-10-CM

## 2021-01-08 DIAGNOSIS — R269 Unspecified abnormalities of gait and mobility: Secondary | ICD-10-CM

## 2021-01-09 ENCOUNTER — Ambulatory Visit: Payer: 59

## 2021-01-09 ENCOUNTER — Other Ambulatory Visit: Payer: Self-pay

## 2021-01-09 ENCOUNTER — Ambulatory Visit: Payer: 59 | Admitting: Occupational Therapy

## 2021-01-09 DIAGNOSIS — M25642 Stiffness of left hand, not elsewhere classified: Secondary | ICD-10-CM

## 2021-01-09 DIAGNOSIS — G5603 Carpal tunnel syndrome, bilateral upper limbs: Secondary | ICD-10-CM | POA: Diagnosis not present

## 2021-01-09 DIAGNOSIS — M6281 Muscle weakness (generalized): Secondary | ICD-10-CM

## 2021-01-09 DIAGNOSIS — R2681 Unsteadiness on feet: Secondary | ICD-10-CM

## 2021-01-09 DIAGNOSIS — M25641 Stiffness of right hand, not elsewhere classified: Secondary | ICD-10-CM

## 2021-01-09 DIAGNOSIS — M79642 Pain in left hand: Secondary | ICD-10-CM
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
## 2021-01-09 NOTE — Patient Instructions (Signed)
Your Splint This splint should initially be fitted by a healthcare practitioner.  The healthcare practitioner is responsible for providing wearing instructions and precautions to the patient, other healthcare practitioners and care provider involved in the patient's care.  This splint was custom made for you. Please read the following instructions to learn about wearing and caring for your splint.  Precautions Should your splint cause any of the following problems, remove the splint immediately and contact your therapist/physician.  Swelling  Severe Pain  Pressure Areas  Stiffness  Numbness  Do not wear your splint while operating machinery unless it has been fabricated for that purpose.  When To Wear Your Splint Where your splint according to your therapist/physician instructions. Daytime for 1 hours then check skin for pressure areas or redness, if no problems progress to wearing for 1 hour 3x day, if any problems, stop wearing splint and bring to next visit  Care and Cleaning of Your Splint 1. Keep your splint away from open flames. 2. Your splint will lose its shape in temperatures over 135 degrees Farenheit, ( in car windows, near radiators, ovens or in hot water).  Never make any adjustments to your splint, if the splint needs adjusting remove it and make an appointment to see your therapist. 3. Your splint, including the cushion liner may be cleaned with soap and lukewarm water.  Do not immerse in hot water over 135 degrees Farenheit.

## 2021-01-10 ENCOUNTER — Encounter: Payer: Self-pay | Admitting: Neurology

## 2021-01-10 ENCOUNTER — Ambulatory Visit: Payer: 59 | Admitting: Neurology

## 2021-01-10 VITALS — BP 119/80 | HR 78 | Resp 18 | Ht 59.0 in | Wt 191.0 lb

## 2021-01-10 DIAGNOSIS — R29898 Other symptoms and signs involving the musculoskeletal system: Secondary | ICD-10-CM | POA: Diagnosis not present

## 2021-01-10 DIAGNOSIS — G959 Disease of spinal cord, unspecified: Secondary | ICD-10-CM

## 2021-01-10 MED ORDER — GABAPENTIN 300 MG PO CAPS: 600.0000 mg | ORAL_CAPSULE | Freq: Three times a day (TID) | ORAL | 1 refills | Status: DC

## 2021-01-10 NOTE — Patient Instructions (Addendum)
You may search the following on [place]:  [place] [employer] v2 Triple Pack. Makes Keys so Much Easier. Perfect for Rheumatoid Arthritis, MS or Parkinsons Gift, Elderly with weak Hands, Key Finder and Holder.  Two Pack Door [employer], Soft Rubberized Doorknob Extension, Converts Knob Style Doors to [employer] for Easy Accessibility for Elderly, Disabled, Children, Fits Door Knobs 2" to 2.5"  https://bestmobilityaids.com/door-devices-for-disabled-access/  Online look up hand assist devices  Increase gabapentin 600mg  three times daily  We will request records from [facility] and [facility]   Return to clinic in 4 months
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
## 2021-01-10 NOTE — Patient Instructions (Signed)
Fall prevention strategies
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
## 2021-01-14 ENCOUNTER — Ambulatory Visit: Payer: 59 | Admitting: Physical Therapy

## 2021-01-14 ENCOUNTER — Ambulatory Visit: Payer: 59 | Admitting: Occupational Therapy

## 2021-01-14 ENCOUNTER — Other Ambulatory Visit: Payer: Self-pay

## 2021-01-14 ENCOUNTER — Encounter: Payer: Self-pay | Admitting: Physical Therapy

## 2021-01-14 DIAGNOSIS — M25642 Stiffness of left hand, not elsewhere classified: Secondary | ICD-10-CM

## 2021-01-14 DIAGNOSIS — M79642 Pain in left hand: Secondary | ICD-10-CM

## 2021-01-14 DIAGNOSIS — M6281 Muscle weakness (generalized): Secondary | ICD-10-CM

## 2021-01-14 DIAGNOSIS — R2681 Unsteadiness on feet: Secondary | ICD-10-CM

## 2021-01-14 DIAGNOSIS — G5603 Carpal tunnel syndrome, bilateral upper limbs: Secondary | ICD-10-CM | POA: Diagnosis not present
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
## 2021-01-14 NOTE — Patient Instructions (Signed)
   MP Flexion (Active)   With back of hand on table, bend large knuckles as far as they will go, keeping small joints straight. Repeat _10-15___ times. Do __2-3_ sessions per day. Perform with the splint.
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
## 2021-01-15 ENCOUNTER — Inpatient Hospital Stay (HOSPITAL_BASED_OUTPATIENT_CLINIC_OR_DEPARTMENT_OTHER): Payer: 59 | Admitting: Oncology

## 2021-01-15 ENCOUNTER — Inpatient Hospital Stay: Payer: 59

## 2021-01-15 ENCOUNTER — Other Ambulatory Visit: Payer: Self-pay

## 2021-01-15 VITALS — BP 150/69 | HR 63 | Temp 98.6°F | Resp 20 | Wt 191.2 lb

## 2021-01-15 DIAGNOSIS — C771 Secondary and unspecified malignant neoplasm of intrathoracic lymph nodes: Secondary | ICD-10-CM

## 2021-01-15 DIAGNOSIS — C50112 Malignant neoplasm of central portion of left female breast: Secondary | ICD-10-CM

## 2021-01-15 DIAGNOSIS — C50812 Malignant neoplasm of overlapping sites of left female breast: Secondary | ICD-10-CM

## 2021-01-15 DIAGNOSIS — Z171 Estrogen receptor negative status [ER-]: Secondary | ICD-10-CM

## 2021-01-15 DIAGNOSIS — Z5112 Encounter for antineoplastic immunotherapy: Secondary | ICD-10-CM | POA: Diagnosis not present

## 2021-01-15 DIAGNOSIS — Z95828 Presence of other vascular implants and grafts: Secondary | ICD-10-CM

## 2021-01-15 LAB — CMP (CANCER CENTER ONLY)
ALT: 11 U/L (ref 0–44)
AST: 17 U/L (ref 15–41)
Albumin: 3.4 g/dL — ABNORMAL LOW (ref 3.5–5.0)
Alkaline Phosphatase: 42 U/L (ref 38–126)
Anion gap: 10 (ref 5–15)
BUN: 31 mg/dL — ABNORMAL HIGH (ref 8–23)
CO2: 26 mmol/L (ref 22–32)
Calcium: 9.3 mg/dL (ref 8.9–10.3)
Chloride: 105 mmol/L (ref 98–111)
Creatinine: 1.06 mg/dL — ABNORMAL HIGH (ref 0.44–1.00)
GFR, Estimated: 59 mL/min — ABNORMAL LOW (ref >60)
Glucose, Bld: 101 mg/dL — ABNORMAL HIGH (ref 70–99)
Potassium: 3.8 mmol/L (ref 3.5–5.1)
Sodium: 141 mmol/L (ref 135–145)
Total Bilirubin: 0.3 mg/dL (ref 0.3–1.2)
Total Protein: 8.1 g/dL (ref 6.5–8.1)

## 2021-01-15 LAB — CBC WITH DIFFERENTIAL (CANCER CENTER ONLY)
Abs Immature Granulocytes: 0.03 10*3/uL (ref 0.00–0.07)
Basophils Absolute: 0 10*3/uL (ref 0.0–0.1)
Basophils Relative: 0 %
Eosinophils Absolute: 0.1 10*3/uL (ref 0.0–0.5)
Eosinophils Relative: 1 %
HCT: 29.6 % — ABNORMAL LOW (ref 36.0–46.0)
Hemoglobin: 9.4 g/dL — ABNORMAL LOW (ref 12.0–15.0)
Immature Granulocytes: 1 %
Lymphocytes Relative: 24 %
Lymphs Abs: 1.1 10*3/uL (ref 0.7–4.0)
MCH: 30.4 pg (ref 26.0–34.0)
MCHC: 31.8 g/dL (ref 30.0–36.0)
MCV: 95.8 fL (ref 80.0–100.0)
Monocytes Absolute: 0.5 10*3/uL (ref 0.1–1.0)
Monocytes Relative: 11 %
Neutro Abs: 2.9 10*3/uL (ref 1.7–7.7)
Neutrophils Relative %: 63 %
Platelet Count: 169 10*3/uL (ref 150–400)
RBC: 3.09 MIL/uL — ABNORMAL LOW (ref 3.87–5.11)
RDW: 13.6 % (ref 11.5–15.5)
WBC Count: 4.7 10*3/uL (ref 4.0–10.5)
nRBC: 0 % (ref 0.0–0.2)

## 2021-01-15 LAB — SAMPLE TO BLOOD BANK

## 2021-01-15 MED ORDER — DIPHENHYDRAMINE HCL 25 MG PO CAPS
25.0000 mg | ORAL_CAPSULE | Freq: Once | ORAL | Status: AC
  Administered 2021-01-15: 25 mg via ORAL

## 2021-01-15 MED ORDER — SODIUM CHLORIDE 0.9% FLUSH
10.0000 mL | INTRAVENOUS | Status: DC | PRN
  Administered 2021-01-15: 10 mL
  Filled 2021-01-15: qty 10

## 2021-01-15 MED ORDER — HEPARIN SOD (PORK) LOCK FLUSH 100 UNIT/ML IV SOLN
500.0000 [IU] | Freq: Once | INTRAVENOUS | Status: AC | PRN
  Administered 2021-01-15: 500 [IU]
  Filled 2021-01-15: qty 5

## 2021-01-15 MED ORDER — ACETAMINOPHEN 325 MG PO TABS
ORAL_TABLET | ORAL | Status: AC
  Filled 2021-01-15: qty 2

## 2021-01-15 MED ORDER — TRASTUZUMAB-ANNS CHEMO 150 MG IV SOLR
6.0000 mg/kg | Freq: Once | INTRAVENOUS | Status: AC
  Administered 2021-01-15: 525 mg via INTRAVENOUS
  Filled 2021-01-15: qty 25

## 2021-01-15 MED ORDER — SODIUM CHLORIDE 0.9% FLUSH
10.0000 mL | Freq: Once | INTRAVENOUS | Status: AC
  Administered 2021-01-15: 10 mL
  Filled 2021-01-15: qty 10

## 2021-01-15 MED ORDER — SODIUM CHLORIDE 0.9 % IV SOLN
Freq: Once | INTRAVENOUS | Status: AC
  Filled 2021-01-15: qty 250

## 2021-01-15 MED ORDER — ACETAMINOPHEN 325 MG PO TABS
650.0000 mg | ORAL_TABLET | Freq: Once | ORAL | Status: AC
  Administered 2021-01-15: 650 mg via ORAL

## 2021-01-15 MED ORDER — DIPHENHYDRAMINE HCL 25 MG PO CAPS
ORAL_CAPSULE | ORAL | Status: AC
  Filled 2021-01-15: qty 1

## 2021-01-15 NOTE — Patient Instructions (Signed)
[facility] °Discharge Instructions for Patients Receiving Chemotherapy ° °Today you received the following chemotherapy agents Trastuzumab ° °To help prevent nausea and vomiting after your treatment, we encourage you to take your nausea medication as directed. °  °If you develop nausea and vomiting that is not controlled by your nausea medication, call the clinic.  ° °BELOW ARE SYMPTOMS THAT SHOULD BE REPORTED IMMEDIATELY: °· *FEVER GREATER THAN 100.5 F °· *CHILLS WITH OR WITHOUT FEVER °· NAUSEA AND VOMITING THAT IS NOT CONTROLLED WITH YOUR NAUSEA MEDICATION °· *UNUSUAL SHORTNESS OF BREATH °· *UNUSUAL BRUISING OR BLEEDING °· TENDERNESS IN MOUTH AND THROAT WITH OR WITHOUT PRESENCE OF ULCERS °· *URINARY PROBLEMS °· *BOWEL PROBLEMS °· UNUSUAL RASH °Items with * indicate a potential emergency and should be followed up as soon as possible. ° °Feel free to call the clinic should you have any questions or concerns. The clinic phone number is [phone]. ° °Please show the CHEMO ALERT CARD at check-in to the Emergency Department and triage nurse. ° ° °
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
## 2021-01-16 ENCOUNTER — Ambulatory Visit: Payer: 59 | Admitting: Physical Therapy

## 2021-01-16 ENCOUNTER — Ambulatory Visit: Payer: 59 | Admitting: Occupational Therapy

## 2021-01-17 ENCOUNTER — Telehealth: Payer: Self-pay | Admitting: Internal Medicine

## 2021-01-17 ENCOUNTER — Ambulatory Visit: Payer: 59 | Attending: Oncology | Admitting: Physical Therapy

## 2021-01-17 ENCOUNTER — Encounter: Payer: Self-pay | Admitting: Physical Therapy

## 2021-01-17 ENCOUNTER — Other Ambulatory Visit: Payer: Self-pay

## 2021-01-17 DIAGNOSIS — M79642 Pain in left hand: Secondary | ICD-10-CM | POA: Diagnosis present

## 2021-01-17 DIAGNOSIS — M25641 Stiffness of right hand, not elsewhere classified: Secondary | ICD-10-CM | POA: Diagnosis present

## 2021-01-17 DIAGNOSIS — M79641 Pain in right hand: Secondary | ICD-10-CM | POA: Diagnosis present

## 2021-01-17 DIAGNOSIS — R2681 Unsteadiness on feet: Secondary | ICD-10-CM | POA: Diagnosis present

## 2021-01-17 DIAGNOSIS — M6281 Muscle weakness (generalized): Secondary | ICD-10-CM | POA: Diagnosis present

## 2021-01-17 DIAGNOSIS — M25642 Stiffness of left hand, not elsewhere classified: Secondary | ICD-10-CM | POA: Diagnosis present

## 2021-01-17 DIAGNOSIS — R2689 Other abnormalities of gait and mobility: Secondary | ICD-10-CM | POA: Diagnosis present
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
## 2021-01-17 NOTE — Telephone Encounter (Signed)
The prescription is not really needed, if you go to [facility] and they can be fitted there

## 2021-01-17 NOTE — Telephone Encounter (Signed)
Patient states at the end of 2021 she got a prescription for compression socks and she never took it to go get them and she misplaced it and she said she needs them now more than ever so she was wondering if Dr. Jenny Reichmann would give her another prescription for them.

## 2021-01-17 NOTE — Patient Instructions (Signed)
Access Code: [email or web address] URL: [email or web address] Date: 01/17/2021 Prepared by: Willow Ora  Exercises Sit to Stand with Counter Support - 1 x daily - 5 x weekly - 1 sets - 10 reps Heel rises with counter support - 1 x daily - 5 x weekly - 1 sets - 10 reps Standing March with Counter Support - 1 x daily - 5 x weekly - 1 sets - 10 reps Side Stepping with Counter Support - 1 x daily - 5 x weekly - 1 sets - 3 reps

## 2021-01-20 ENCOUNTER — Ambulatory Visit: Payer: 59 | Admitting: Neurology

## 2021-01-20 NOTE — Telephone Encounter (Signed)
Patient notified

## 2021-01-21 ENCOUNTER — Telehealth: Payer: Self-pay | Admitting: Neurology

## 2021-01-21 ENCOUNTER — Encounter: Payer: Self-pay | Admitting: Physical Therapy

## 2021-01-21 ENCOUNTER — Encounter: Payer: Self-pay | Admitting: Occupational Therapy

## 2021-01-21 ENCOUNTER — Ambulatory Visit: Payer: 59 | Admitting: Occupational Therapy

## 2021-01-21 ENCOUNTER — Ambulatory Visit: Payer: 59 | Admitting: Physical Therapy

## 2021-01-21 ENCOUNTER — Other Ambulatory Visit: Payer: Self-pay

## 2021-01-21 DIAGNOSIS — R2681 Unsteadiness on feet: Secondary | ICD-10-CM

## 2021-01-21 DIAGNOSIS — M6281 Muscle weakness (generalized): Secondary | ICD-10-CM | POA: Diagnosis not present

## 2021-01-21 DIAGNOSIS — M79642 Pain in left hand: Secondary | ICD-10-CM

## 2021-01-21 DIAGNOSIS — R200 Anesthesia of skin: Secondary | ICD-10-CM

## 2021-01-21 DIAGNOSIS — G619 Inflammatory polyneuropathy, unspecified: Secondary | ICD-10-CM

## 2021-01-21 DIAGNOSIS — M25641 Stiffness of right hand, not elsewhere classified: Secondary | ICD-10-CM

## 2021-01-21 DIAGNOSIS — M79641 Pain in right hand: Secondary | ICD-10-CM

## 2021-01-21 DIAGNOSIS — M25642 Stiffness of left hand, not elsewhere classified: Secondary | ICD-10-CM

## 2021-01-21 DIAGNOSIS — R202 Paresthesia of skin: Secondary | ICD-10-CM

## 2021-01-21 DIAGNOSIS — R29898 Other symptoms and signs involving the musculoskeletal system: Secondary | ICD-10-CM

## 2021-01-21 NOTE — Telephone Encounter (Signed)
Called and informed patient that I received and reviewed her prior NCS/EMG which shows diffuse sensorimotor neuropathy affecting the arms, which is worse then what was seen on her NCS/EMG from October. NCS/EMG dated 10/31/2020 showed absent median, ulnar, and radial sensory responses with prolonged median and ulnar motor responses.    With these significant interval change on EDX, I recommend that we check additional labs and proceed with CSF testing to look for inflammatory neuropathy (CIDP).  She is agreeable to additional testing.   Check ESR, CRP, vitamin B12, folate, TSH, SPEP with IFE, copper Check the following on lumbar puncture:  CSF cell count and diff, protein, glucose, IgG index, oligoclonal bands, flow cytology  Jhania Etherington K. Posey Pronto, DO
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
## 2021-01-22 ENCOUNTER — Ambulatory Visit: Payer: 59
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
## 2021-01-22 NOTE — Telephone Encounter (Signed)
Order for Lumbar puncture has been placed in epic and also faxed to [facility]. Labs have also been placed.   Called patient and informed her that orders have been placed. Provided patient with [facility] number to call if she has not heard anything in a few days. Patient also advised that we have a lab on the 2nd floor that closes at 4:40pm. Patient informed to please come to our office to check in prior to 4:30pm for lab work. Patient verbalized understanding.

## 2021-01-23 ENCOUNTER — Ambulatory Visit: Payer: 59 | Admitting: Physical Therapy

## 2021-01-23 ENCOUNTER — Other Ambulatory Visit (INDEPENDENT_AMBULATORY_CARE_PROVIDER_SITE_OTHER): Payer: 59

## 2021-01-23 ENCOUNTER — Encounter: Payer: Self-pay | Admitting: Physical Therapy

## 2021-01-23 ENCOUNTER — Ambulatory Visit: Payer: 59 | Admitting: Occupational Therapy

## 2021-01-23 ENCOUNTER — Other Ambulatory Visit: Payer: Self-pay

## 2021-01-23 DIAGNOSIS — M25642 Stiffness of left hand, not elsewhere classified: Secondary | ICD-10-CM

## 2021-01-23 DIAGNOSIS — G619 Inflammatory polyneuropathy, unspecified: Secondary | ICD-10-CM

## 2021-01-23 DIAGNOSIS — M79641 Pain in right hand: Secondary | ICD-10-CM

## 2021-01-23 DIAGNOSIS — M6281 Muscle weakness (generalized): Secondary | ICD-10-CM | POA: Diagnosis not present

## 2021-01-23 DIAGNOSIS — R2681 Unsteadiness on feet: Secondary | ICD-10-CM

## 2021-01-23 DIAGNOSIS — R29898 Other symptoms and signs involving the musculoskeletal system: Secondary | ICD-10-CM

## 2021-01-23 DIAGNOSIS — M79642 Pain in left hand: Secondary | ICD-10-CM

## 2021-01-23 DIAGNOSIS — R202 Paresthesia of skin: Secondary | ICD-10-CM

## 2021-01-23 DIAGNOSIS — R200 Anesthesia of skin: Secondary | ICD-10-CM

## 2021-01-23 DIAGNOSIS — M25641 Stiffness of right hand, not elsewhere classified: Secondary | ICD-10-CM

## 2021-01-23 LAB — B12 AND FOLATE PANEL
Folate: 8.9 ng/mL (ref >5.9)
Vitamin B-12: 508 pg/mL (ref 211–911)

## 2021-01-23 LAB — SEDIMENTATION RATE: Sed Rate: 100 mm/hr — ABNORMAL HIGH (ref 0–30)

## 2021-01-23 LAB — TSH: TSH: 1.57 u[IU]/mL (ref 0.35–4.50)

## 2021-01-23 LAB — C-REACTIVE PROTEIN: CRP: <1 mg/dL (ref 0.5–20.0)
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
## 2021-01-23 NOTE — Patient Instructions (Signed)
Flexor Tendon Gliding (Active Hook Fist)   With fingers and knuckles straight, bend middle and tip joints. Do not bend large knuckles.Use other hand to hold hand down so that you can bend and straigthen tip joints perform 10 reps 2-3 x day   MP Flexion (Active)   With back of hand on table, bend large knuckles as far as they will go, keeping small joints straight. Repeat _10-15___ times. Do __2-3_ sessions per day. Wear exercise splint for this exercise Activity: Reach into a narrow container.*      Finger Flexion / Extension   With palm up, bend fingers of left hand toward palm, making a  fist. Straighten fingers, opening fist. Repeat sequence _10-15___ times per session. Do _2-3__ sessions per day. Hand Variation: Palm down   Copyright  VHI. All rights reserved.
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
## 2021-01-24 ENCOUNTER — Telehealth: Payer: Self-pay

## 2021-01-24 NOTE — Telephone Encounter (Signed)
Lab orders and results reviewed and [facility] appeared to be not completed. Faxed form with [facility] lab information to fulfill missing label request. Dr. Posey Pronto aware.

## 2021-01-24 NOTE — Telephone Encounter (Signed)
-----   Message from Alda Berthold, DO sent at 01/24/2021 10:47 AM EDT ----- Please find out which lab is missing?  Looks like they got a sample without order?  ----- Message ----- From: Virl Son Sent: 01/24/2021  10:30 AM EDT To: Alda Berthold, DO

## 2021-01-27 ENCOUNTER — Other Ambulatory Visit: Payer: Self-pay

## 2021-01-27 ENCOUNTER — Ambulatory Visit
Admission: RE | Admit: 2021-01-27 | Discharge: 2021-01-27 | Disposition: A | Discharge status: Home / Self Care | Payer: 59 | Source: Ambulatory Visit | Attending: Neurology | Admitting: Neurology

## 2021-01-27 DIAGNOSIS — R200 Anesthesia of skin: Secondary | ICD-10-CM

## 2021-01-27 DIAGNOSIS — M6281 Muscle weakness (generalized): Secondary | ICD-10-CM | POA: Diagnosis not present

## 2021-01-27 DIAGNOSIS — R202 Paresthesia of skin: Secondary | ICD-10-CM

## 2021-01-27 DIAGNOSIS — G619 Inflammatory polyneuropathy, unspecified: Secondary | ICD-10-CM

## 2021-01-27 DIAGNOSIS — R29898 Other symptoms and signs involving the musculoskeletal system: Secondary | ICD-10-CM

## 2021-01-27 LAB — UNLABELED: Test Ordered On Req: 363

## 2021-01-27 NOTE — Progress Notes (Signed)
22ml blood sample drawn from butterfly needle in rt forearm per Brita Romp RN for labs.

## 2021-01-27 NOTE — Discharge Instructions (Signed)

## 2021-01-28 ENCOUNTER — Ambulatory Visit: Payer: 59 | Admitting: Occupational Therapy

## 2021-01-28 ENCOUNTER — Ambulatory Visit: Payer: 59 | Admitting: Physical Therapy

## 2021-01-28 LAB — CYTOLOGY - NON PAP

## 2021-01-30 ENCOUNTER — Ambulatory Visit: Payer: 59

## 2021-01-30 ENCOUNTER — Other Ambulatory Visit: Payer: Self-pay

## 2021-01-30 ENCOUNTER — Ambulatory Visit: Payer: 59 | Admitting: Occupational Therapy

## 2021-01-30 DIAGNOSIS — M6281 Muscle weakness (generalized): Secondary | ICD-10-CM

## 2021-01-30 DIAGNOSIS — M79641 Pain in right hand: Secondary | ICD-10-CM

## 2021-01-30 DIAGNOSIS — M25641 Stiffness of right hand, not elsewhere classified: Secondary | ICD-10-CM

## 2021-01-30 DIAGNOSIS — M25642 Stiffness of left hand, not elsewhere classified: Secondary | ICD-10-CM

## 2021-01-30 DIAGNOSIS — M79642 Pain in left hand: Secondary | ICD-10-CM

## 2021-01-30 DIAGNOSIS — R2681 Unsteadiness on feet: Secondary | ICD-10-CM

## 2021-01-30 NOTE — Patient Instructions (Signed)
Edema glove, Wear the edema glove on your left hand at night time,  or during the daytime if you are having swelling. Remove if it feels to tight or causes increased pain.
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
## 2021-01-31 LAB — IMMUNOFIXATION ELECTROPHORESIS
IgG (Immunoglobin G), Serum: 2177 mg/dL — ABNORMAL HIGH (ref 600–1540)
IgM, Serum: 70 mg/dL (ref 50–300)
Immunofix Electr Int: NOT DETECTED
Immunoglobulin A: 369 mg/dL — ABNORMAL HIGH (ref 70–320)

## 2021-01-31 LAB — COPPER, SERUM: Copper: 173 ug/dL (ref 70–175)

## 2021-01-31 LAB — PAT ID TIQ DOC: Test Affected: 363

## 2021-01-31 LAB — PROTEIN ELECTROPHORESIS, SERUM
Albumin ELP: 3.6 g/dL — ABNORMAL LOW (ref 3.8–4.8)
Alpha 1: 0.3 g/dL (ref 0.2–0.3)
Alpha 2: 0.8 g/dL (ref 0.5–0.9)
Beta 2: 0.5 g/dL (ref 0.2–0.5)
Beta Globulin: 0.4 g/dL (ref 0.4–0.6)
Gamma Globulin: 1.9 g/dL — ABNORMAL HIGH (ref 0.8–1.7)
Total Protein: 7.5 g/dL (ref 6.1–8.1)

## 2021-02-04 ENCOUNTER — Ambulatory Visit: Payer: 59 | Admitting: Occupational Therapy

## 2021-02-04 ENCOUNTER — Ambulatory Visit: Payer: 59
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
## 2021-02-05 ENCOUNTER — Other Ambulatory Visit: Payer: Self-pay

## 2021-02-05 ENCOUNTER — Inpatient Hospital Stay (HOSPITAL_BASED_OUTPATIENT_CLINIC_OR_DEPARTMENT_OTHER): Payer: 59 | Admitting: Oncology

## 2021-02-05 ENCOUNTER — Inpatient Hospital Stay: Payer: 59

## 2021-02-05 ENCOUNTER — Inpatient Hospital Stay: Payer: 59 | Attending: Oncology

## 2021-02-05 ENCOUNTER — Other Ambulatory Visit: Payer: 59

## 2021-02-05 VITALS — BP 142/73 | HR 61 | Temp 97.7°F | Resp 20 | Ht 59.0 in | Wt 197.7 lb

## 2021-02-05 DIAGNOSIS — C50112 Malignant neoplasm of central portion of left female breast: Secondary | ICD-10-CM

## 2021-02-05 DIAGNOSIS — Z90722 Acquired absence of ovaries, bilateral: Secondary | ICD-10-CM | POA: Insufficient documentation

## 2021-02-05 DIAGNOSIS — Z801 Family history of malignant neoplasm of trachea, bronchus and lung: Secondary | ICD-10-CM | POA: Insufficient documentation

## 2021-02-05 DIAGNOSIS — C50812 Malignant neoplasm of overlapping sites of left female breast: Secondary | ICD-10-CM

## 2021-02-05 DIAGNOSIS — C50412 Malignant neoplasm of upper-outer quadrant of left female breast: Secondary | ICD-10-CM | POA: Diagnosis present

## 2021-02-05 DIAGNOSIS — Z7901 Long term (current) use of anticoagulants: Secondary | ICD-10-CM | POA: Diagnosis not present

## 2021-02-05 DIAGNOSIS — D649 Anemia, unspecified: Secondary | ICD-10-CM | POA: Diagnosis not present

## 2021-02-05 DIAGNOSIS — Z171 Estrogen receptor negative status [ER-]: Secondary | ICD-10-CM

## 2021-02-05 DIAGNOSIS — Z90710 Acquired absence of both cervix and uterus: Secondary | ICD-10-CM | POA: Diagnosis not present

## 2021-02-05 DIAGNOSIS — Z923 Personal history of irradiation: Secondary | ICD-10-CM | POA: Insufficient documentation

## 2021-02-05 DIAGNOSIS — Z833 Family history of diabetes mellitus: Secondary | ICD-10-CM | POA: Diagnosis not present

## 2021-02-05 DIAGNOSIS — C771 Secondary and unspecified malignant neoplasm of intrathoracic lymph nodes: Secondary | ICD-10-CM

## 2021-02-05 DIAGNOSIS — Z86718 Personal history of other venous thrombosis and embolism: Secondary | ICD-10-CM | POA: Insufficient documentation

## 2021-02-05 DIAGNOSIS — Z170 Estrogen receptor positive status [ER+]: Secondary | ICD-10-CM | POA: Insufficient documentation

## 2021-02-05 DIAGNOSIS — Z5112 Encounter for antineoplastic immunotherapy: Secondary | ICD-10-CM | POA: Diagnosis present

## 2021-02-05 DIAGNOSIS — Z95828 Presence of other vascular implants and grafts: Secondary | ICD-10-CM

## 2021-02-05 DIAGNOSIS — Z8542 Personal history of malignant neoplasm of other parts of uterus: Secondary | ICD-10-CM | POA: Diagnosis not present

## 2021-02-05 DIAGNOSIS — Z803 Family history of malignant neoplasm of breast: Secondary | ICD-10-CM | POA: Insufficient documentation

## 2021-02-05 DIAGNOSIS — C773 Secondary and unspecified malignant neoplasm of axilla and upper limb lymph nodes: Secondary | ICD-10-CM | POA: Insufficient documentation

## 2021-02-05 DIAGNOSIS — Z8249 Family history of ischemic heart disease and other diseases of the circulatory system: Secondary | ICD-10-CM | POA: Diagnosis not present

## 2021-02-05 DIAGNOSIS — Z79899 Other long term (current) drug therapy: Secondary | ICD-10-CM | POA: Diagnosis not present

## 2021-02-05 LAB — CMP (CANCER CENTER ONLY)
ALT: 10 U/L (ref 0–44)
AST: 18 U/L (ref 15–41)
Albumin: 3.4 g/dL — ABNORMAL LOW (ref 3.5–5.0)
Alkaline Phosphatase: 42 U/L (ref 38–126)
Anion gap: 9 (ref 5–15)
BUN: 26 mg/dL — ABNORMAL HIGH (ref 8–23)
CO2: 23 mmol/L (ref 22–32)
Calcium: 9.5 mg/dL (ref 8.9–10.3)
Chloride: 107 mmol/L (ref 98–111)
Creatinine: 1.02 mg/dL — ABNORMAL HIGH (ref 0.44–1.00)
GFR, Estimated: >60 mL/min (ref >60)
Glucose, Bld: 91 mg/dL (ref 70–99)
Potassium: 4.6 mmol/L (ref 3.5–5.1)
Sodium: 139 mmol/L (ref 135–145)
Total Bilirubin: 0.3 mg/dL (ref 0.3–1.2)
Total Protein: 8.1 g/dL (ref 6.5–8.1)

## 2021-02-05 LAB — CBC WITH DIFFERENTIAL (CANCER CENTER ONLY)
Abs Immature Granulocytes: 0.01 10*3/uL (ref 0.00–0.07)
Basophils Absolute: 0 10*3/uL (ref 0.0–0.1)
Basophils Relative: 0 %
Eosinophils Absolute: 0.1 10*3/uL (ref 0.0–0.5)
Eosinophils Relative: 1 %
HCT: 29.9 % — ABNORMAL LOW (ref 36.0–46.0)
Hemoglobin: 9.5 g/dL — ABNORMAL LOW (ref 12.0–15.0)
Immature Granulocytes: 0 %
Lymphocytes Relative: 27 %
Lymphs Abs: 1.6 10*3/uL (ref 0.7–4.0)
MCH: 30.4 pg (ref 26.0–34.0)
MCHC: 31.8 g/dL (ref 30.0–36.0)
MCV: 95.8 fL (ref 80.0–100.0)
Monocytes Absolute: 0.6 10*3/uL (ref 0.1–1.0)
Monocytes Relative: 10 %
Neutro Abs: 3.7 10*3/uL (ref 1.7–7.7)
Neutrophils Relative %: 62 %
Platelet Count: 168 10*3/uL (ref 150–400)
RBC: 3.12 MIL/uL — ABNORMAL LOW (ref 3.87–5.11)
RDW: 13.2 % (ref 11.5–15.5)
WBC Count: 6 10*3/uL (ref 4.0–10.5)
nRBC: 0 % (ref 0.0–0.2)

## 2021-02-05 LAB — CSF CELL COUNT WITH DIFFERENTIAL
Basophils, %: 0 %
Lymphs, CSF: 96 % — ABNORMAL HIGH (ref 40–80)
Monocyte/Macrophage: 4 % — ABNORMAL LOW (ref 15–45)
RBC Count, CSF: 0 cells/uL
Segmented Neutrophils-CSF: 0 % (ref 0–6)
WBC, CSF: 7 cells/uL — ABNORMAL HIGH (ref 0–5)

## 2021-02-05 LAB — CNS IGG SYNTHESIS RATE, CSF+BLOOD
Albumin Serum: 3.8 g/dL (ref 3.2–4.6)
Albumin, CSF: 15.6 mg/dL (ref 8.0–42.0)
CNS-IgG Synthesis Rate: -5.8 mg/24 h (ref <=3.3)
IgG (Immunoglobin G), Serum: 2130 mg/dL — ABNORMAL HIGH (ref 600–1540)
IgG Total CSF: 4.4 mg/dL (ref 0.8–7.7)
IgG-Index: 0.5 (ref <0.66)

## 2021-02-05 LAB — OLIGOCLONAL BANDS, CSF + SERM

## 2021-02-05 LAB — PROTEIN, CSF: Total Protein, CSF: 33 mg/dL (ref 15–60)

## 2021-02-05 LAB — GLUCOSE, CSF: Glucose, CSF: 53 mg/dL (ref 40–80)

## 2021-02-05 MED ORDER — ACETAMINOPHEN 325 MG PO TABS
ORAL_TABLET | ORAL | Status: AC
  Filled 2021-02-05: qty 2

## 2021-02-05 MED ORDER — SODIUM CHLORIDE 0.9% FLUSH
10.0000 mL | INTRAVENOUS | Status: DC | PRN
  Administered 2021-02-05: 10 mL
  Filled 2021-02-05: qty 10

## 2021-02-05 MED ORDER — HEPARIN SOD (PORK) LOCK FLUSH 100 UNIT/ML IV SOLN
500.0000 [IU] | Freq: Once | INTRAVENOUS | Status: AC | PRN
Start: 2021-02-05 — End: 2021-02-05
  Administered 2021-02-05: 500 [IU]
  Filled 2021-02-05: qty 5

## 2021-02-05 MED ORDER — SODIUM CHLORIDE 0.9 % IV SOLN
Freq: Once | INTRAVENOUS | Status: AC
  Filled 2021-02-05: qty 250

## 2021-02-05 MED ORDER — DIPHENHYDRAMINE HCL 25 MG PO CAPS
ORAL_CAPSULE | ORAL | Status: AC
  Filled 2021-02-05: qty 2

## 2021-02-05 MED ORDER — ACETAMINOPHEN 325 MG PO TABS
650.0000 mg | ORAL_TABLET | Freq: Once | ORAL | Status: AC
  Administered 2021-02-05: 650 mg via ORAL

## 2021-02-05 MED ORDER — TRASTUZUMAB-ANNS CHEMO 150 MG IV SOLR
6.0000 mg/kg | Freq: Once | INTRAVENOUS | Status: AC
  Administered 2021-02-05: 525 mg via INTRAVENOUS
  Filled 2021-02-05: qty 25

## 2021-02-05 MED ORDER — SODIUM CHLORIDE 0.9% FLUSH
10.0000 mL | Freq: Once | INTRAVENOUS | Status: AC
  Administered 2021-02-05: 10 mL
  Filled 2021-02-05: qty 10

## 2021-02-05 MED ORDER — DIPHENHYDRAMINE HCL 25 MG PO CAPS
25.0000 mg | ORAL_CAPSULE | Freq: Once | ORAL | Status: AC
  Administered 2021-02-05: 25 mg via ORAL

## 2021-02-05 NOTE — Patient Instructions (Signed)
[facility] °Discharge Instructions for Patients Receiving Chemotherapy ° °Today you received the following chemotherapy agents Trastuzumab ° °To help prevent nausea and vomiting after your treatment, we encourage you to take your nausea medication as directed. °  °If you develop nausea and vomiting that is not controlled by your nausea medication, call the clinic.  ° °BELOW ARE SYMPTOMS THAT SHOULD BE REPORTED IMMEDIATELY: °· *FEVER GREATER THAN 100.5 F °· *CHILLS WITH OR WITHOUT FEVER °· NAUSEA AND VOMITING THAT IS NOT CONTROLLED WITH YOUR NAUSEA MEDICATION °· *UNUSUAL SHORTNESS OF BREATH °· *UNUSUAL BRUISING OR BLEEDING °· TENDERNESS IN MOUTH AND THROAT WITH OR WITHOUT PRESENCE OF ULCERS °· *URINARY PROBLEMS °· *BOWEL PROBLEMS °· UNUSUAL RASH °Items with * indicate a potential emergency and should be followed up as soon as possible. ° °Feel free to call the clinic should you have any questions or concerns. The clinic phone number is [phone]. ° °Please show the CHEMO ALERT CARD at check-in to the Emergency Department and triage nurse. ° ° °

## 2021-02-06 ENCOUNTER — Ambulatory Visit: Payer: 59 | Admitting: Occupational Therapy

## 2021-02-06 ENCOUNTER — Telehealth: Payer: Self-pay | Admitting: Neurology

## 2021-02-06 ENCOUNTER — Ambulatory Visit: Payer: 59

## 2021-02-06 ENCOUNTER — Encounter: Payer: Self-pay | Admitting: Occupational Therapy

## 2021-02-06 DIAGNOSIS — M79642 Pain in left hand: Secondary | ICD-10-CM

## 2021-02-06 DIAGNOSIS — G619 Inflammatory polyneuropathy, unspecified: Secondary | ICD-10-CM

## 2021-02-06 DIAGNOSIS — M25642 Stiffness of left hand, not elsewhere classified: Secondary | ICD-10-CM

## 2021-02-06 DIAGNOSIS — M25641 Stiffness of right hand, not elsewhere classified: Secondary | ICD-10-CM

## 2021-02-06 DIAGNOSIS — M6281 Muscle weakness (generalized): Secondary | ICD-10-CM | POA: Diagnosis not present

## 2021-02-06 DIAGNOSIS — M79641 Pain in right hand: Secondary | ICD-10-CM

## 2021-02-06 DIAGNOSIS — R2681 Unsteadiness on feet: Secondary | ICD-10-CM

## 2021-02-06 NOTE — Telephone Encounter (Signed)
Patient called in wanting to see about getting her results from previous testing.

## 2021-02-06 NOTE — Telephone Encounter (Signed)
Results of CSF and labs discussed with patient.  Specifically, no evidence of inflammatory polyradiculoneuropathy.  Oligoclonal bands are present in the serum and CSF which may suggest systemic inflammation, not MS.   The only lab markedly abnormal was ESR which was 100, CRP normal.  I discussed that this can be a nonspecific marker of inflammation.  I will recheck ESR and also screen for other inflammatory conditions.  If the next set of labs are unremarkable, we may need to check paraneoplastic antibody for neuropathy.   Check ESR, CRP, ANA, RF, c/p-ANCA, cryoglobulins, SSA/B  Kypton Eltringham K. Posey Pronto, DO
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
## 2021-02-07 ENCOUNTER — Other Ambulatory Visit: Payer: Self-pay

## 2021-02-07 ENCOUNTER — Ambulatory Visit: Payer: 59

## 2021-02-07 DIAGNOSIS — R2681 Unsteadiness on feet: Secondary | ICD-10-CM

## 2021-02-07 DIAGNOSIS — M6281 Muscle weakness (generalized): Secondary | ICD-10-CM | POA: Diagnosis not present
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
## 2021-02-07 NOTE — Patient Instructions (Signed)
Access Code: [name] URL: [email or web address] Date: 02/07/2021 Prepared by: Sharlynn Oliphant  Exercises Standing March with Unilateral Counter Support - 2 x daily - 7 x weekly - 2 sets - 10 reps Side to Side Weight Shift with Counter Support - 2 x daily - 7 x weekly - 2 sets - 10 reps Forward Backward Weight Shift with Counter Support - 2 x daily - 7 x weekly - 2 sets - 10 reps

## 2021-02-11 ENCOUNTER — Ambulatory Visit: Payer: 59 | Admitting: Physical Therapy

## 2021-02-11 ENCOUNTER — Encounter: Payer: Self-pay | Admitting: Physical Therapy

## 2021-02-11 ENCOUNTER — Ambulatory Visit: Payer: 59 | Admitting: Occupational Therapy

## 2021-02-11 ENCOUNTER — Other Ambulatory Visit: Payer: Self-pay

## 2021-02-11 ENCOUNTER — Other Ambulatory Visit: Payer: 59

## 2021-02-11 DIAGNOSIS — G619 Inflammatory polyneuropathy, unspecified: Secondary | ICD-10-CM

## 2021-02-11 DIAGNOSIS — M25642 Stiffness of left hand, not elsewhere classified: Secondary | ICD-10-CM

## 2021-02-11 DIAGNOSIS — R2681 Unsteadiness on feet: Secondary | ICD-10-CM

## 2021-02-11 DIAGNOSIS — M6281 Muscle weakness (generalized): Secondary | ICD-10-CM

## 2021-02-11 DIAGNOSIS — M25641 Stiffness of right hand, not elsewhere classified: Secondary | ICD-10-CM

## 2021-02-11 DIAGNOSIS — M79642 Pain in left hand: Secondary | ICD-10-CM

## 2021-02-11 DIAGNOSIS — M79641 Pain in right hand: Secondary | ICD-10-CM

## 2021-02-11 LAB — SEDIMENTATION RATE: Sed Rate: 81 mm/hr — ABNORMAL HIGH (ref 0–30)

## 2021-02-11 LAB — C-REACTIVE PROTEIN: CRP: <1 mg/dL (ref 0.5–20.0)

## 2021-02-11 NOTE — Patient Instructions (Signed)
Wear oval 8 thumb splints during daytime only, remove if pressure areas or problems.
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
## 2021-02-13 ENCOUNTER — Other Ambulatory Visit: Payer: Self-pay

## 2021-02-13 ENCOUNTER — Ambulatory Visit: Payer: 59

## 2021-02-13 ENCOUNTER — Ambulatory Visit: Payer: 59 | Admitting: Occupational Therapy

## 2021-02-13 DIAGNOSIS — M79642 Pain in left hand: Secondary | ICD-10-CM

## 2021-02-13 DIAGNOSIS — M25641 Stiffness of right hand, not elsewhere classified: Secondary | ICD-10-CM

## 2021-02-13 DIAGNOSIS — R2681 Unsteadiness on feet: Secondary | ICD-10-CM

## 2021-02-13 DIAGNOSIS — M25642 Stiffness of left hand, not elsewhere classified: Secondary | ICD-10-CM

## 2021-02-13 DIAGNOSIS — M6281 Muscle weakness (generalized): Secondary | ICD-10-CM | POA: Diagnosis not present

## 2021-02-13 DIAGNOSIS — R2689 Other abnormalities of gait and mobility: Secondary | ICD-10-CM

## 2021-02-13 DIAGNOSIS — M79641 Pain in right hand: Secondary | ICD-10-CM
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
## 2021-02-13 NOTE — Patient Instructions (Signed)
Your Splint This splint should initially be fitted by a healthcare practitioner.  The healthcare practitioner is responsible for providing wearing instructions and precautions to the patient, other healthcare practitioners and care provider involved in the patient's care.  This splint was custom made for you. Please read the following instructions to learn about wearing and caring for your splint.  Precautions Should your splint cause any of the following problems, remove the splint immediately and contact your therapist/physician.  Swelling  Severe Pain  Pressure Areas  Stiffness  Numbness  Do not wear your splint while operating machinery unless it has been fabricated for that purpose.  When To Wear Your Splint Where your splint according to your therapist/physician instructions. Daytime for 1 hours then check your skin if not problems wear for several hours while at rest, during the day. If no problems after wearing for several days, you can sleep in splint. Wear splint on left and right hands alternately at night  Care and Cleaning of Your Splint 1. Keep your splint away from open flames. 2. Your splint will lose its shape in temperatures over 135 degrees Farenheit, ( in car windows, near radiators, ovens or in hot water).  Never make any adjustments to your splint, if the splint needs adjusting remove it and make an appointment to see your therapist. 3. Your splint, including the cushion liner may be cleaned with soap and lukewarm water.  Do not immerse in hot water over 135 degrees Farenheit. 4. Straps may be washed with soap and water, but do not moisten the self-adhesive portion. 5. For ink or hard to remove spots use a scouring cleanser which contains chlorine.  Rinse the splint thoroughly after using chlorine cleanser.
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
## 2021-02-17 ENCOUNTER — Ambulatory Visit: Payer: 59 | Admitting: Neurology

## 2021-02-17 ENCOUNTER — Other Ambulatory Visit: Payer: Self-pay

## 2021-02-17 ENCOUNTER — Encounter: Payer: Self-pay | Admitting: Oncology

## 2021-02-17 ENCOUNTER — Encounter (HOSPITAL_COMMUNITY)
Admission: RE | Admit: 2021-02-17 | Discharge: 2021-02-17 | Disposition: A | Discharge status: Home / Self Care | Payer: 59 | Source: Ambulatory Visit | Attending: Oncology | Admitting: Oncology

## 2021-02-17 DIAGNOSIS — Z7189 Other specified counseling: Secondary | ICD-10-CM | POA: Diagnosis present

## 2021-02-17 DIAGNOSIS — C50812 Malignant neoplasm of overlapping sites of left female breast: Secondary | ICD-10-CM | POA: Diagnosis present

## 2021-02-17 DIAGNOSIS — C771 Secondary and unspecified malignant neoplasm of intrathoracic lymph nodes: Secondary | ICD-10-CM | POA: Diagnosis present

## 2021-02-17 DIAGNOSIS — C50112 Malignant neoplasm of central portion of left female breast: Secondary | ICD-10-CM | POA: Diagnosis present

## 2021-02-17 DIAGNOSIS — C541 Malignant neoplasm of endometrium: Secondary | ICD-10-CM | POA: Insufficient documentation

## 2021-02-17 DIAGNOSIS — Z171 Estrogen receptor negative status [ER-]: Secondary | ICD-10-CM | POA: Insufficient documentation

## 2021-02-17 DIAGNOSIS — K760 Fatty (change of) liver, not elsewhere classified: Secondary | ICD-10-CM | POA: Diagnosis present

## 2021-02-17 LAB — SJOGREN'S SYNDROME ANTIBODS(SSA + SSB)
SSA (Ro) (ENA) Antibody, IgG: <1 AI
SSB (La) (ENA) Antibody, IgG: <1 AI

## 2021-02-17 LAB — CRYOGLOBULIN: Cryoglobulin, Qualitative Analysis: NOT DETECTED

## 2021-02-17 LAB — RHEUMATOID FACTOR: Rheumatoid fact SerPl-aCnc: <14 IU/mL (ref <14)

## 2021-02-17 LAB — ANA: Anti Nuclear Antibody (ANA): NEGATIVE

## 2021-02-17 LAB — GLUCOSE, CAPILLARY: Glucose-Capillary: 111 mg/dL — ABNORMAL HIGH (ref 70–99)

## 2021-02-17 MED ORDER — FLUDEOXYGLUCOSE F - 18 (FDG) INJECTION
10.2000 | Freq: Once | INTRAVENOUS | Status: AC | PRN
  Administered 2021-02-17: 9.72 via INTRAVENOUS

## 2021-02-18 ENCOUNTER — Ambulatory Visit (HOSPITAL_BASED_OUTPATIENT_CLINIC_OR_DEPARTMENT_OTHER)
Admission: RE | Admit: 2021-02-18 | Discharge: 2021-02-18 | Disposition: A | Discharge status: Home / Self Care | Payer: 59 | Source: Ambulatory Visit | Attending: Cardiology | Admitting: Cardiology

## 2021-02-18 ENCOUNTER — Ambulatory Visit (HOSPITAL_COMMUNITY)
Admission: RE | Admit: 2021-02-18 | Discharge: 2021-02-18 | Disposition: A | Discharge status: Home / Self Care | Payer: 59 | Source: Ambulatory Visit | Attending: Cardiology | Admitting: Cardiology

## 2021-02-18 ENCOUNTER — Encounter (HOSPITAL_COMMUNITY): Payer: Self-pay | Admitting: Cardiology

## 2021-02-18 VITALS — BP 120/70 | HR 63 | Wt 191.6 lb

## 2021-02-18 DIAGNOSIS — Z01810 Encounter for preprocedural cardiovascular examination: Secondary | ICD-10-CM | POA: Insufficient documentation

## 2021-02-18 DIAGNOSIS — Z0189 Encounter for other specified special examinations: Secondary | ICD-10-CM | POA: Diagnosis not present

## 2021-02-18 DIAGNOSIS — E8589 Other amyloidosis: Secondary | ICD-10-CM | POA: Insufficient documentation

## 2021-02-18 DIAGNOSIS — C50812 Malignant neoplasm of overlapping sites of left female breast: Secondary | ICD-10-CM | POA: Insufficient documentation

## 2021-02-18 DIAGNOSIS — I5032 Chronic diastolic (congestive) heart failure: Secondary | ICD-10-CM

## 2021-02-18 DIAGNOSIS — Z171 Estrogen receptor negative status [ER-]: Secondary | ICD-10-CM | POA: Diagnosis not present

## 2021-02-18 LAB — ECHOCARDIOGRAM COMPLETE
Area-P 1/2: 6.27 cm2
Calc EF: 55.4 %
S' Lateral: 2.9 cm
Single Plane A2C EF: 59.1 %
Single Plane A4C EF: 50.2 %

## 2021-02-18 NOTE — Progress Notes (Signed)
  Echocardiogram 2D Echocardiogram has been performed.  Merrie Roof F 02/18/2021, 11:58 AM

## 2021-02-18 NOTE — Patient Instructions (Signed)
Take heart medications as they are ordered. (coreg is twice a day)  Your physician has requested that you have an echocardiogram. Echocardiography is a painless test that uses sound waves to create images of your heart. It provides your doctor with information about the size and shape of your heart and how well your heart's chambers and valves are working. This procedure takes approximately one hour. There are no restrictions for this procedure.  You have been ordered a PYP Scan.  This is done in the Radiology Department of [facility].  When you come for this test please plan to be there 2-3 hours.  Labs today We will only contact you if something comes back abnormal or we need to make some changes. Otherwise no news is good news!  Your physician recommends that you schedule a follow-up appointment in: 3 months with an echo  Please call office at [phone] option 2 if you have any questions or concerns.   At the [facility], you and your health needs are our priority. As part of our continuing mission to provide you with exceptional heart care, we have created designated Provider Care Teams. These Care Teams include your primary Cardiologist (physician) and Advanced Practice Providers (APPs- Physician Assistants and Nurse Practitioners) who all work together to provide you with the care you need, when you need it.   You may see any of the following providers on your designated Care Team at your next follow up: [name] Dr Glori Bickers . Dr Loralie Champagne . Dr Vickki Muff . Darrick Grinder, NP . Lyda Jester, [place] . Audry Riles, PharmD   Please be sure to bring in all your medications bottles to every appointment.

## 2021-02-19 ENCOUNTER — Encounter: Payer: Self-pay | Admitting: Physical Therapy

## 2021-02-19 ENCOUNTER — Ambulatory Visit: Payer: 59 | Admitting: Physical Therapy

## 2021-02-19 ENCOUNTER — Other Ambulatory Visit: Payer: Self-pay

## 2021-02-19 ENCOUNTER — Ambulatory Visit: Payer: 59 | Attending: Oncology | Admitting: Occupational Therapy

## 2021-02-19 DIAGNOSIS — M79641 Pain in right hand: Secondary | ICD-10-CM | POA: Diagnosis present

## 2021-02-19 DIAGNOSIS — R2681 Unsteadiness on feet: Secondary | ICD-10-CM

## 2021-02-19 DIAGNOSIS — M6281 Muscle weakness (generalized): Secondary | ICD-10-CM | POA: Diagnosis present

## 2021-02-19 DIAGNOSIS — M25642 Stiffness of left hand, not elsewhere classified: Secondary | ICD-10-CM

## 2021-02-19 DIAGNOSIS — R2689 Other abnormalities of gait and mobility: Secondary | ICD-10-CM | POA: Insufficient documentation

## 2021-02-19 DIAGNOSIS — M79602 Pain in left arm: Secondary | ICD-10-CM

## 2021-02-19 DIAGNOSIS — M79642 Pain in left hand: Secondary | ICD-10-CM | POA: Diagnosis present

## 2021-02-19 DIAGNOSIS — M25641 Stiffness of right hand, not elsewhere classified: Secondary | ICD-10-CM

## 2021-02-19 NOTE — Progress Notes (Signed)
Oncology: Dr. Jana Hakim  65 y.o. with history of DM, HTN, hyperlipidemia, endometrial cancer, and breast cancer was referred by Dr. Jana Hakim for cardio-oncology evaluation.  Initial breast cancer diagnosis was in 2012.  She had left breast lumpectomy and radiation.  Recurrence in 7/20, ER+/PR-/HER2+.  She had chemotherapy with trastuzumab/pertuzumab/carboplatin/docetaxol x 6 starting 2/21.  With fall in EF in 4/21, Herceptin was held.  Echo in 4/21 showed EF 45-50%, Coreg was started. Echo in 6/21 showed EF back up to 55-60%, and Herceptin was restarted.  In 6/21, she had lumpectomy.  Echo in 7/21 showed EF stable at 55-60%.    Echo in 9/21 showed EF 55-60% with normal RV, strain images did not track well.  Coronary CTA was done in 9/21, showing coronary artery calcium score 0 and no significant CAD.    PET in 9/21 with new lymphadenopathy.  She was started on TDM-1/Kadcyla but later changed back to Herceptin which will continue for long-term.   In 12/21, she had a cervical discectomy and fusion.   Echo in 2/22 showed EF 55% with GLS -10.7% but poor endocardial tracing. Normal RV.  Echo was done today and reviewed, EF 50-55%, mild LVH, normal RV, GLS -14.3% but poor endocardial tracing.   Patient returns for followup of diastolic CHF.  Pain complaint continues to be pain and burning in hands and feet consistent with neuropathy.  This is quite severe.  She walks with a walker and denies exertional dyspnea and chest pain now though she is not very active.  She takes her Coreg only 3-4 times a week and only once a day.  She takes her telmisartan and Lasix only 3-4 times/week.   Labs (5/21): K 4.1, creatinine 0.89 Labs (7/21): K 4.2, creatinine 0.95 Labs (8/21): LDL 81, BNP 36 Labs (9/21): K 3.7, creatinine 0.94 Labs (1/22): K 3.2, creatinine 1.14 Labs (4/22): K 4.6, creatinine 1.02  PMH: 1. HTN 2. Diabetes 3. Endometrial carcinoma: 8/17 diagnosis, had hysterectomy and BSO.  4. Hyperlipidemia 5.  Breast cancer: Initial diagnosis in 2012.  Had left breast lumpectomy and radiation.  Recurrence in 7/20, ER+/PR-/HER2+.  She had chemotherapy with trastuzumab/pertuzumab/carboplatin/docetaxol x 6 starting 2/21.  With fall in EF in 4/21, Herceptin was held but later restarted.  Lumpectomy 6/21.  - Echo (1/21): EF 60-65%, GLS -20%, normal RV.  - Echo (4/21): EF 45-50%, global hypokinesis, GLS -12.6%, mildly decreased RV function.  - Echo (6/21): EF 55-60%, GLS -14.4%, mild LVH, PASP 38, RV normal.  - Echo (7/21): EF 55-60%, mild LVH, GLS -16.9%, RV  Normal - Echo (9/21): EF 81-01%, grade 2 diastolic dysfunction, GLS -11.6% (poor endocardial tracking), normal RV, normal IVC.  - Echo (2/22): EF 55%, GLS -10.7% (poor endocardial tracking), normal RV.  - Echo (4/22): EF 50-55%, mild LVH, normal RV, GLS -14.3% but poor endocardial tracing. 6. Coronary calcium score scan (8/21): CAC 0 Agatston units.  - Coronary CTA (9/21): CAC 0 Agatston units.  No significant CAD.  7. DVT: Left leg, 8/21.  8. Cervical discectomy and fusion in 12/21.  9. Peripheral neuropathy 10. Carpal tunnel syndrome  Social History   Socioeconomic History  . Marital status: Divorced    Spouse name: Not on file  . Number of children: 0  . Years of education: Not on file  . Highest education level: Not on file  Occupational History  . Occupation: Education officer, museum    Employer: [employer]    Comment: retired  Tobacco Use  . Smoking status:  Never Smoker  . Smokeless tobacco: Never Used  Vaping Use  . Vaping Use: Never used  Substance and Sexual Activity  . Alcohol use: Not Currently    Comment: occasional  . Drug use: No  . Sexual activity: Not Currently  Other Topics Concern  . Not on file  Social History Narrative   1 son deceased with homicide   Patient has been divorced twice   Right handed   Drinks caffeine   One story    Social Determinants of Health   Financial Resource Strain: Not on file  Food  Insecurity: Not on file  Transportation Needs: Unmet Transportation Needs  . Lack of Transportation (Medical): Yes  . Lack of Transportation (Non-Medical): Yes  Physical Activity: Not on file  Stress: Not on file  Social Connections: Not on file  Intimate Partner Violence: Not on file   Family History  Problem Relation Age of Onset  . Diabetes Mother   . Hypertension Mother   . Stroke Mother   . Heart disease Father        COPD  . Alcohol abuse Father        ETOH dependence  . Breast cancer Maternal Aunt        dx in her 45s  . Lung cancer Maternal Uncle   . Breast cancer Paternal Aunt   . Cancer Maternal Grandmother        salivary gland cancer  . Colon cancer Neg Hx    ROS: All systems reviewed and negative except as per HPI.   Current Outpatient Medications  Medication Sig Dispense Refill  . carvedilol (COREG) 6.25 MG tablet Take 1 tablet (6.25 mg total) by mouth 2 (two) times daily. 180 tablet 3  . Continuous Blood Gluc Sensor (FREESTYLE LIBRE 14 DAY SENSOR) MISC INJECT 1 SENSOR TO THE SKIN EVERY 14 DAYS FOR CONTINUOUS GLUCOSE MONITORING.    . furosemide (LASIX) 40 MG tablet TAKE 1 TABLET BY MOUTH EVERY DAY 30 tablet 3  . gabapentin (NEURONTIN) 300 MG capsule Take 2 capsules (600 mg total) by mouth 3 (three) times daily. 480 capsule 1  . glucose blood (ONE TOUCH ULTRA TEST) test strip 1 each by Other route 2 (two) times daily. Use to check blood sugars twice a day Dx E11.9 100 each 0  . Lancets (ONETOUCH ULTRASOFT) lancets 1 each by Other route 2 (two) times daily. Use to check blood sugars twice a day Dx E11.9 100 each 0  . lidocaine-prilocaine (EMLA) cream 4 gram tid prn 120 g 5  . omeprazole (PRILOSEC) 40 MG capsule Take 40 mg by mouth daily as needed (Heartburn).    [name] oxyCODONE (OXY IR/ROXICODONE) 5 MG immediate release tablet Take 1 tablet (5 mg total) by mouth every 4 (four) hours as needed for severe pain. 30 tablet 0  . potassium chloride SA (KLOR-CON M20) 20 MEQ  tablet Take 1 tablet (20 mEq total) by mouth daily. 90 tablet 3  . pravastatin (PRAVACHOL) 40 MG tablet Take 1 tablet (40 mg total) by mouth daily. 90 tablet 3  . rivaroxaban (XARELTO) 20 MG TABS tablet Take 1 tablet (20 mg total) by mouth daily with supper. 90 tablet 3  . silver sulfADIAZINE (SILVADENE) 1 % cream Apply 1 application topically as needed.    [name] telmisartan-hydrochlorothiazide (MICARDIS HCT) 80-12.5 MG tablet Take 1 tablet by mouth daily. 90 tablet 3   No current facility-administered medications for this encounter.   Facility-Administered Medications Ordered in Other Encounters  Medication  Dose Route Frequency Provider Last Rate Last Admin  . cloNIDine (CATAPRES) tablet 0.1 mg  0.1 mg Oral Daily Harle Stanford., PA-C   0.1 mg at 11/21/19 1742   BP 120/70   Pulse 63   Wt 86.9 kg (191 lb 9.6 oz)   LMP 05/18/2016   SpO2 98%   BMI 38.70 kg/m  General: NAD Neck: No JVD, no thyromegaly or thyroid nodule.  Lungs: Clear to auscultation bilaterally with normal respiratory effort. CV: Nondisplaced PMI.  Heart regular S1/S2, no S3/S4, no murmur.  No peripheral edema.  No carotid bruit.  Normal pedal pulses.  Abdomen: Soft, nontender, no hepatosplenomegaly, no distention.  Skin: Intact without lesions or rashes.  Neurologic: Alert and oriented x 3.  Psych: Normal affect. Extremities: No clubbing or cyanosis.  HEENT: Normal.   Assessment/Plan: 1. Chemotherapy-related cardiomyopathy: Fall in EF and strain in the setting of Herceptin use.  Echo in 4/21 with EF 45-50% with GLS -12.6%.  Herceptin was held, Coreg started, and echo in 6/21 showed EF up to 55-60% with GLS -14.4% => Herceptin restarted.  Echo in 7/21 with EF 55-60%, GLS more negative at -16.9%.  Echo in 2/22 and in 9/21 showed normal EF but less negative strain.  However, strain images did not appear to track well on either echo.  She has been taking her cardiac meds only sporadically recently, and echo today showed EF  50-55%, again with poor strain tracking.  NYHA class II symptoms.  She does not appear volume overloaded on exam.  Main problem seems to be neuropathy.  - She needs to take her cardiac meds as ordered, with Coreg 6.25 mg bid and telmisartan and Lasix daily.   - Repeat echo in 3 months to ensure no further fall in EF over time, she will continue Herceptin.  - With painful peripheral neuropathy and carpal tunnel syndrome as well as LVH with diastolic CHF, think we should work her up for amyloidosis.  Will check myeloma panel, urine immunofixation, and PYP scan.   2. HTN: BP controlled.   3. Hyperlipidemia: Keep LDL at least < 100 with diabetes.  - Continue pravastatin.   4. Chest pain: None recently.  Coronary CTA in 9/21 was normal.  5. DVT: on left.   - Continue Xarelto long-term given DVT in setting of malignancy.   Followup in 3 months with echo.   Loralie Champagne 02/19/2021
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
## 2021-02-21 ENCOUNTER — Encounter: Payer: Self-pay | Admitting: Neurology

## 2021-02-21 ENCOUNTER — Ambulatory Visit: Payer: 59 | Admitting: Occupational Therapy

## 2021-02-21 ENCOUNTER — Ambulatory Visit: Payer: 59

## 2021-02-21 ENCOUNTER — Other Ambulatory Visit: Payer: Self-pay

## 2021-02-21 ENCOUNTER — Ambulatory Visit: Payer: 59 | Admitting: Neurology

## 2021-02-21 ENCOUNTER — Encounter: Payer: Self-pay | Admitting: Occupational Therapy

## 2021-02-21 VITALS — BP 116/76 | HR 62 | Ht 59.0 in | Wt 194.0 lb

## 2021-02-21 DIAGNOSIS — R768 Other specified abnormal immunological findings in serum: Secondary | ICD-10-CM

## 2021-02-21 DIAGNOSIS — M6281 Muscle weakness (generalized): Secondary | ICD-10-CM

## 2021-02-21 DIAGNOSIS — R200 Anesthesia of skin: Secondary | ICD-10-CM | POA: Diagnosis not present

## 2021-02-21 DIAGNOSIS — G619 Inflammatory polyneuropathy, unspecified: Secondary | ICD-10-CM | POA: Diagnosis not present

## 2021-02-21 DIAGNOSIS — R2689 Other abnormalities of gait and mobility: Secondary | ICD-10-CM

## 2021-02-21 DIAGNOSIS — R202 Paresthesia of skin: Secondary | ICD-10-CM

## 2021-02-21 DIAGNOSIS — M79642 Pain in left hand: Secondary | ICD-10-CM

## 2021-02-21 DIAGNOSIS — M25642 Stiffness of left hand, not elsewhere classified: Secondary | ICD-10-CM

## 2021-02-21 DIAGNOSIS — G959 Disease of spinal cord, unspecified: Secondary | ICD-10-CM

## 2021-02-21 DIAGNOSIS — R29898 Other symptoms and signs involving the musculoskeletal system: Secondary | ICD-10-CM

## 2021-02-21 DIAGNOSIS — M25641 Stiffness of right hand, not elsewhere classified: Secondary | ICD-10-CM

## 2021-02-21 DIAGNOSIS — R2681 Unsteadiness on feet: Secondary | ICD-10-CM

## 2021-02-21 DIAGNOSIS — M79641 Pain in right hand: Secondary | ICD-10-CM

## 2021-02-21 NOTE — Progress Notes (Signed)
  Follow-up Visit   Date: 02/21/21   [name] MRN: [id number] DOB: 01/15/1956   Interim History: [name] is a 64 y.o. right-handed female with metastatic breast cancer s/p chemotherapy and radiation, s/p ACDF C4-5 (09/2020), endometrial cancer, diabetes mellitus, hypertension  returning to the clinic for follow-up of bilateral hand weakness and right foot drop.  The patient was accompanied to the clinic by self.  History of present illness: She began having right hand tingling in September 2021, which then transitioned into left shoulder and arm pain.  NCS/EMG in October showed bilateral carpal tunnel syndrome.  Around the same time, she began having weakness in the hands and worsening feet tingling/stinging pain. Symptoms continued to progress and by November, she had developed right foot drop.  MRI cervical spine showed severe spinal stenosis at C4-5 with flattening of the cervical cord.  She underwent ACDF at C4-5 in December 2021.  Since this time, she has not developed any new weakness or pain, however, there has not been any improvement in weakness.  She struggles to manipulate her keys, door handles, and any fine motor movements.  She is doing OT and PT. Her pain is better controlled on gabapentin 300mg three times daily, but still not under optimal control.  She lives alone.   UPDATE 02/21/2021:  At her last visit, I requested additional records from her [facility] where she had NCS/EMG in January 2022.  These results indicated a much more diffuse sensorimotor polyneuropathy, markedly different from what was seen in October with Dr. Yan.  Due to these findings, I suggested she have spinal testing to evaluate for polyradiculoneuropathy.  LP was essentially normal.  Her serology shows markedly elevated ESR (>100).  Therefore, additional autoimmune labs were checked.  Of these, her PR3 antibody returned positive (31).  She is here to review her testing  todate.  She denies any new symptoms, improvement or worsening.  She has been going to PT/OT.    Medications:  Current Outpatient Medications on File Prior to Visit  Medication Sig Dispense Refill  . carvedilol (COREG) 6.25 MG tablet Take 1 tablet (6.25 mg total) by mouth 2 (two) times daily. 180 tablet 3  . Continuous Blood Gluc Sensor (FREESTYLE LIBRE 14 DAY SENSOR) MISC INJECT 1 SENSOR TO THE SKIN EVERY 14 DAYS FOR CONTINUOUS GLUCOSE MONITORING.    . furosemide (LASIX) 40 MG tablet TAKE 1 TABLET BY MOUTH EVERY DAY 30 tablet 3  . gabapentin (NEURONTIN) 300 MG capsule Take 2 capsules (600 mg total) by mouth 3 (three) times daily. 480 capsule 1  . glucose blood (ONE TOUCH ULTRA TEST) test strip 1 each by Other route 2 (two) times daily. Use to check blood sugars twice a day Dx E11.9 100 each 0  . Lancets (ONETOUCH ULTRASOFT) lancets 1 each by Other route 2 (two) times daily. Use to check blood sugars twice a day Dx E11.9 100 each 0  . lidocaine-prilocaine (EMLA) cream 4 gram tid prn 120 g 5  . omeprazole (PRILOSEC) 40 MG capsule Take 40 mg by mouth daily as needed (Heartburn).    . oxyCODONE (OXY IR/ROXICODONE) 5 MG immediate release tablet Take 1 tablet (5 mg total) by mouth every 4 (four) hours as needed for severe pain. 30 tablet 0  . potassium chloride SA (KLOR-CON M20) 20 MEQ tablet Take 1 tablet (20 mEq total) by mouth daily. 90 tablet 3  . pravastatin (PRAVACHOL) 40 MG tablet Take 1 tablet (40 mg total) by   mouth daily. 90 tablet 3  . rivaroxaban (XARELTO) 20 MG TABS tablet Take 1 tablet (20 mg total) by mouth daily with supper. 90 tablet 3  . silver sulfADIAZINE (SILVADENE) 1 % cream Apply 1 application topically as needed.    [name] telmisartan-hydrochlorothiazide (MICARDIS HCT) 80-12.5 MG tablet Take 1 tablet by mouth daily. 90 tablet 3   Current Facility-Administered Medications on File Prior to Visit  Medication Dose Route Frequency Provider Last Rate Last Admin  . cloNIDine (CATAPRES)  tablet 0.1 mg  0.1 mg Oral Daily Harle Stanford., PA-C   0.1 mg at 11/21/19 1742    Allergies:  Allergies  Allergen Reactions  . Morphine And Related Nausea And Vomiting  . Codeine Nausea Only  . Cymbalta [Duloxetine Hcl] Other (See Comments)    Head felt funny, ? thinking not right  . Darvon Nausea Only  . Hydrocodone Nausea Only  . Hydrocodone-Acetaminophen Nausea Only  . Oxycodone Nausea Only  . Propoxyphene Hcl Nausea Only  . Rosuvastatin Other (See Comments)    Bone pain    Vital Signs:  BP 116/76   Pulse 62   Ht 4' 11" (1.499 m)   Wt 194 lb (88 kg)   LMP 05/18/2016   SpO2 97%   BMI 39.18 kg/m   Neurological Exam: MENTAL STATUS including orientation to time, place, person, recent and remote memory, attention span and concentration, language, and fund of knowledge is normal.  Speech is not dysarthric.  CRANIAL NERVES:  No visual field defects.  Pupils equal round and reactive to light.  Normal conjugate, extra-ocular eye movements in all directions of gaze.  No ptosis.    MOTOR:  Severe bilateral hand atrophy with extensor deformity.  No fasciculations or abnormal movements.  No pronator drift.   Upper Extremity:  Right  Left  Deltoid  5/5   5/5   Biceps  5/5   5/5   Triceps  5/5   5/5   Infraspinatus 5/5  5/5  Medial pectoralis 5/5  5/5  Wrist extensors  5-/5   5-/5   Wrist flexors  5-/5   5-/5   Finger extensors  3/5   3-/5   Finger flexors  4/5   4/5   Dorsal interossei  3/5   3/5   Abductor pollicis  3/5   3/5   Tone (Ashworth scale)  0  0   Lower Extremity:  Right  Left  Hip flexors  5/5   5/5   Hip extensors  5/5   5/5   Adductor 5/5  5/5  Abductor 5/5  5/5  Knee flexors  5/5   5/5   Knee extensors  5/5   5/5   Dorsiflexors  4/5   5/5   Plantarflexors  4/5   5/5   Toe extensors  4/5   5/5   Toe flexors  4/5   5/5   Tone (Ashworth scale)  0  0   MSRs:    Right        Left                  brachioradialis 3+  3+   biceps 3+  3+  triceps 3+  3+  patellar 3+  3+  ankle jerk 2+  2+  Hoffman no  no  plantar response down  down   SENSORY:  Temperature and pin prick reduced in the hands and feet.  Vibration reduced at the right ankle.  COORDINATION/GAIT: Normal finger-to- nose-finger.  Finger tapping is slowed due to hand weakness.  Walks with rollator.    Data: NCS/EMG of the arms 08/05/2020: This is an abnormal study. 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 There are also electrodiagnostic evidence of moderate left carpal tunnel syndromes. There was no evidence of active right cervical radiculopathy.  NCS/EMG from [facility] (under media tab) which shows diffuse sensorimotor neuropathy affecting the arms, which is worse then what was seen on her NCS/EMG from October. NCS/EMG dated 10/31/2020 showed absent median, ulnar, and radial sensory responses with prolonged median and ulnar motor responses.    MRI cervical spine wo contrast 09/09/2020: 1. Prominent right central disc osteophyte complex at C4-5 with resultant severe spinal stenosis and flattening of the cervical spinal cord. No appreciable cord signal changes. 2. Broad posterior disc osteophyte at C5-6 with resultant mild spinal stenosis, with moderate left worse than right C6 foraminal narrowing. 3. Right subarticular to foraminal disc osteophyte complex at C3-4 with resultant mild spinal stenosis, with moderate right C4 foraminal narrowing. 4. 1.5 cm cystic well-circumscribed lesion adjacent to the upper esophagus as above, indeterminate. Unclear whether this is associated with the adjacent esophagus, possibly reflecting an esophageal diverticulum, or possibly an exophytic thyroid nodule. Further evaluation with dedicated thyroid ultrasound recommended. (ref: J Am Coll Radiol. 2015 Feb;12(2): 143-50). 5. No evidence for  metastatic disease within the cervical spine.  Labs 01/23/2021:  ESR >100, TSH 1.57, B12 508, folate 8.9, SPEP with IFE no M protein, copper 173 Labs 02/11/2021: ESR 81*, antiPR3 antibody 31*, MPO neg, SSA/B neg, RF neg, cryo neg, CRP <1.0   CSF analysis 01/27/2021:  R0 W7* P33 G53, cytology neg, OCB - identical bands in CSF and serum suggestive of systemic inflammation  IMPRESSION/PLAN: This is a complex case.   Bilateral hand weakness and atrophy due to overlapping cervical myelopathy s/p ACDF as well as subacute onset of polyneuropathy.  From her history, her neuropathy progressed rapidly and I think initially, it was difficult to determine how much of her symptoms were stemming from cervical myelopathy vs. rapidly progressive neuropathy.   In addition to hand paresthesias, she developed painful right foot drop.  The asymmetric involvement raises concern for possible vasculitic process causing inflammatory neuropathy. There is no evidence of polyradiculoneuropathy on CSF testing.  I have checked a number of labs for etiology, of which her ESR remains elevated and she has positive PR3 antibody.  I will request the opinion of rheumatology to better understand the significance of his antibody in her clinical setting.   Total time spent reviewing records, interview, history/exam, documentation, counseling, and coordination of care on day of encounter:  45 min  Thank you for allowing me to participate in patient's care.  If I can answer any additional questions, I would be pleased to do so.    Sincerely,    Ayub Kirsh K. Posey Pronto, DO
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
## 2021-02-21 NOTE — Patient Instructions (Addendum)
We will refer you to rheumatology to evaluate the significance of PR3 antibody
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
## 2021-02-24 LAB — MULTIPLE MYELOMA PANEL, SERUM
Albumin SerPl Elph-Mcnc: 3.5 g/dL (ref 2.9–4.4)
Albumin/Glob SerPl: 0.9 (ref 0.7–1.7)
Alpha 1: 0.2 g/dL (ref 0.0–0.4)
Alpha2 Glob SerPl Elph-Mcnc: 0.8 g/dL (ref 0.4–1.0)
B-Globulin SerPl Elph-Mcnc: 1.1 g/dL (ref 0.7–1.3)
Gamma Glob SerPl Elph-Mcnc: 2 g/dL — ABNORMAL HIGH (ref 0.4–1.8)
Globulin, Total: 4.2 g/dL — ABNORMAL HIGH (ref 2.2–3.9)
IgA: 401 mg/dL — ABNORMAL HIGH (ref 87–352)
IgG (Immunoglobin G), Serum: 2471 mg/dL — ABNORMAL HIGH (ref 586–1602)
IgM (Immunoglobulin M), Srm: 71 mg/dL (ref 26–217)
Total Protein ELP: 7.7 g/dL (ref 6.0–8.5)

## 2021-02-25 ENCOUNTER — Ambulatory Visit: Payer: 59 | Admitting: Physical Therapy

## 2021-02-25 ENCOUNTER — Encounter: Payer: Self-pay | Admitting: Occupational Therapy

## 2021-02-25 ENCOUNTER — Other Ambulatory Visit: Payer: Self-pay

## 2021-02-25 ENCOUNTER — Ambulatory Visit: Payer: 59 | Admitting: Occupational Therapy

## 2021-02-25 ENCOUNTER — Encounter: Payer: Self-pay | Admitting: Physical Therapy

## 2021-02-25 DIAGNOSIS — M25641 Stiffness of right hand, not elsewhere classified: Secondary | ICD-10-CM

## 2021-02-25 DIAGNOSIS — M79642 Pain in left hand: Secondary | ICD-10-CM | POA: Diagnosis not present

## 2021-02-25 DIAGNOSIS — M79602 Pain in left arm: Secondary | ICD-10-CM

## 2021-02-25 DIAGNOSIS — M79641 Pain in right hand: Secondary | ICD-10-CM

## 2021-02-25 DIAGNOSIS — M6281 Muscle weakness (generalized): Secondary | ICD-10-CM

## 2021-02-25 DIAGNOSIS — R2681 Unsteadiness on feet: Secondary | ICD-10-CM

## 2021-02-25 DIAGNOSIS — M25642 Stiffness of left hand, not elsewhere classified: Secondary | ICD-10-CM
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
## 2021-02-25 NOTE — Progress Notes (Signed)
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, grade 3, estrogen and progesterone receptor negative  (9) definitive surgery delayed (see discussion in 11/03/2019 note)  (10) genetics testing 06/14/2019 through the Multi-Gene Panel offered by [facility] found no deleterious mutations in AIP, ALK, APC, ATM, AXIN2,BAP1,  BARD1, BLM, BMPR1A, BRCA1, BRCA2, BRIP1, CASR, CDC73, CDH1, CDK4, CDKN1B, CDKN1C, CDKN2A (p14ARF), CDKN2A (p16INK4a), CEBPA, CHEK2, CTNNA1, DICER1, DIS3L2, EGFR (c.2369C>T, p.Thr790Met variant only), EPCAM (Deletion/duplication testing only), FH, FLCN, GATA2, GPC3, GREM1 (Promoter region deletion/duplication testing only), HOXB13 (c.251G>A, p.Gly84Glu), HRAS, KIT, MAX, MEN1, MET, MITF (c.952G>A, p.Glu318Lys variant only), MLH1, MSH2, MSH3,  MSH6, MUTYH, NBN, NF1, NF2, NTHL1, PALB2, PDGFRA, PHOX2B, PMS2, POLD1, POLE, POT1, PRKAR1A, PTCH1, PTEN, RAD50, RAD51C, RAD51D, RB1, RECQL4, RET, RNF43, RUNX1, SDHAF2, SDHA (sequence changes only), SDHB, SDHC, SDHD, SMAD4, SMARCA4, SMARCB1, SMARCE1, STK11, SUFU, TERC, TERT, TMEM127, TP53, TSC1, TSC2, VHL, WRN and WT1.    C: METASTATIC BREAST CANCER: JAN 2021 (11) left axillary lymph node biopsy 10/30/2019 documents invasive mammary carcinoma, grade 3, estrogen receptor positive (80%, weak), progesterone receptor negative, HER-2 amplified (3+) MIB-70%  (a) breast MRI 11/23/2019 shows 3.6 cm non-mass-like enhancement  in the left breast, with a second more clumped area measuring 4.8 cm, and at least 5 morphologically abnormal left axillary lymph node.  There is a left subpectoral lymph node and a left internal mammary lymph node noted as well.  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  (b) pertuzumab held with cycle 2 because of persistent diarrhea  (c) anti-HER2 therapy held after cycle 4 because of a drop in EF  (d) carbo/gemzar stopped after cycle 5, last dose 02/13/2020  (13) left breast lumpectomy and axillary node dissection on 04/10/2020 showed a residual ypT0 ypN1    (a) 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  (b) a total of 2 left axillary lymph nodes removed, one positive  (14) anti-HER-2 treatment resumed after surgery to be continued indefinitely  (a) echo 11/09/2019 shows an ejection fraction in the 60-65% range  (b) echo 02/09/2020 shows an ejection fraction in  the 45 to 50% range  (c) echo 03/26/2020 shows an ejection fraction in the 55-60% range  (d) trastuzumab resumed 03/28/2020  (e) echo 05/06/2020 shows an ejection fraction in the 55-60% range  (f) trastuzumab discontinued after 06/20/2020 dose with progression  (14) switched to TDM-1/[place] starting 07/11/2020  (a) new baseline PET scan 07/01/2020 shows significant supraclavicular, mediastinal and prevascular adenopathy with new small left pleural and pericardial effusions  (b) echo 06/28/2020 shows and ejection fraction in the 55-60% range  (c) PET scan on 08/15/2020 shows interval response to chemotherapy with decrease in size and SUV of lymphadenopathy, no new or progressive disease identified in abdomen, pelvis, or bones  (d) switched back to Herceptin/trastuzumab  as of 10/24/2020 secondary to patient's concerns regarding possible neuropathy  (e) repeat echocardiography 11/21/2020 shows an ejection fraction in the 55% range  (f) echocardiogram 02/17/2021 shows an ejection fraction in the 50-55% range  (g) brain MRI 11/28/2020 shows no evidence of metastatic disease  (h) PET scan 02/17/2021 shows significant response in the mediastinal adenopathy and left lateral breast mass  (14) did not receive adjuvant radiation   (16) left lower extremity DVT documented by Doppler ultrasound 05/31/2020  (a) rivaroxaban/Xarelto started 05/31/2020  (17) osteoarthritis/ degenerative disease  (a) cervical spine MRI 09/08/2020 showed a herniated nucleus pulposus at cervical 4/5, no evidence of metastatic disease within the cervical spine  (b) lumbar MRI 09/27/2020 showed significant degenerative disease, no evidence of metastasis  (c) status post anterior cervical decompression C4/5 with arthrodesis 10/02/2020 ([place])  (18) bilateral carpal tunnel syndrome documented by electromyography 08/05/2020, severe on the right, moderate on the left Krista Blue)   PLAN: Sheli is now 1-1/2 years out from  definitive diagnosis of metastatic breast cancer.  Her disease is well controlled on her current treatment and she is tolerating the treatment well.  Accordingly we are continuing the trastuzumab every 3 weeks, with echocardiography every 3 months.  She also has orders for Retacrit but she has not been receiving this.  Apparently he has not been noted.  I have entered a special order within her Herceptin orders so that this will not be overlooked in the future and I have spoken nursing today regarding this.  She will receive a Retacrit today.  I am glad that she is being proactive, not only doing a lot of exercises to get her grip back but also driving cooking and cleaning her own house.  She is very slowly regaining control from the disease.  I encouraged her to continue to do everything she can to be as independent as possible  She will see me again in 9 weeks.  She knows to call for any other issue that may develop before then  Total encounter time 35 minutes.*  Total encounter time 25 minutes.Sarajane Jews C. Kaylee Trivett, MD 02/26/21 1:19 PM Medical Oncology and Hematology [facility] [place], [place] [address] Tel. [phone]    Fax. [phone]   I, Wilburn Mylar, am acting as scribe for Dr. [name]. Lorean Ekstrand.  I, Lurline Del MD, have reviewed the above documentation for accuracy and completeness, and I agree with the above.   *Total Encounter Time as defined by the Centers for Medicare and Medicaid Services includes, in addition to the face-to-face time of a patient visit (documented in the note above) non-face-to-face time: obtaining and reviewing outside history, ordering and reviewing medications, tests or procedures, care coordination (communications with other health care professionals or caregivers) and documentation in the medical record.
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
## 2021-02-26 ENCOUNTER — Inpatient Hospital Stay: Payer: 59 | Admitting: Oncology

## 2021-02-26 ENCOUNTER — Other Ambulatory Visit: Payer: 59

## 2021-02-26 ENCOUNTER — Inpatient Hospital Stay: Payer: 59

## 2021-02-26 ENCOUNTER — Inpatient Hospital Stay: Payer: 59 | Attending: Adult Health

## 2021-02-26 VITALS — BP 116/61 | HR 69 | Temp 97.3°F | Resp 18 | Ht 59.0 in | Wt 194.2 lb

## 2021-02-26 DIAGNOSIS — C50812 Malignant neoplasm of overlapping sites of left female breast: Secondary | ICD-10-CM

## 2021-02-26 DIAGNOSIS — Z171 Estrogen receptor negative status [ER-]: Secondary | ICD-10-CM

## 2021-02-26 DIAGNOSIS — Z8542 Personal history of malignant neoplasm of other parts of uterus: Secondary | ICD-10-CM | POA: Diagnosis not present

## 2021-02-26 DIAGNOSIS — G5603 Carpal tunnel syndrome, bilateral upper limbs: Secondary | ICD-10-CM | POA: Diagnosis not present

## 2021-02-26 DIAGNOSIS — Z5112 Encounter for antineoplastic immunotherapy: Secondary | ICD-10-CM | POA: Diagnosis not present

## 2021-02-26 DIAGNOSIS — Z923 Personal history of irradiation: Secondary | ICD-10-CM | POA: Diagnosis not present

## 2021-02-26 DIAGNOSIS — C771 Secondary and unspecified malignant neoplasm of intrathoracic lymph nodes: Secondary | ICD-10-CM

## 2021-02-26 DIAGNOSIS — Z833 Family history of diabetes mellitus: Secondary | ICD-10-CM | POA: Diagnosis not present

## 2021-02-26 DIAGNOSIS — Z801 Family history of malignant neoplasm of trachea, bronchus and lung: Secondary | ICD-10-CM | POA: Diagnosis not present

## 2021-02-26 DIAGNOSIS — Z90722 Acquired absence of ovaries, bilateral: Secondary | ICD-10-CM | POA: Diagnosis not present

## 2021-02-26 DIAGNOSIS — Z95828 Presence of other vascular implants and grafts: Secondary | ICD-10-CM

## 2021-02-26 DIAGNOSIS — C50112 Malignant neoplasm of central portion of left female breast: Secondary | ICD-10-CM

## 2021-02-26 DIAGNOSIS — Z803 Family history of malignant neoplasm of breast: Secondary | ICD-10-CM | POA: Diagnosis not present

## 2021-02-26 DIAGNOSIS — Z8249 Family history of ischemic heart disease and other diseases of the circulatory system: Secondary | ICD-10-CM | POA: Diagnosis not present

## 2021-02-26 DIAGNOSIS — E119 Type 2 diabetes mellitus without complications: Secondary | ICD-10-CM

## 2021-02-26 DIAGNOSIS — Z9221 Personal history of antineoplastic chemotherapy: Secondary | ICD-10-CM | POA: Insufficient documentation

## 2021-02-26 DIAGNOSIS — Z86718 Personal history of other venous thrombosis and embolism: Secondary | ICD-10-CM | POA: Insufficient documentation

## 2021-02-26 DIAGNOSIS — Z90710 Acquired absence of both cervix and uterus: Secondary | ICD-10-CM | POA: Insufficient documentation

## 2021-02-26 DIAGNOSIS — Z79899 Other long term (current) drug therapy: Secondary | ICD-10-CM | POA: Diagnosis not present

## 2021-02-26 DIAGNOSIS — Z170 Estrogen receptor positive status [ER+]: Secondary | ICD-10-CM | POA: Diagnosis not present

## 2021-02-26 DIAGNOSIS — C50412 Malignant neoplasm of upper-outer quadrant of left female breast: Secondary | ICD-10-CM | POA: Diagnosis present

## 2021-02-26 DIAGNOSIS — D649 Anemia, unspecified: Secondary | ICD-10-CM | POA: Insufficient documentation

## 2021-02-26 DIAGNOSIS — C773 Secondary and unspecified malignant neoplasm of axilla and upper limb lymph nodes: Secondary | ICD-10-CM | POA: Diagnosis not present

## 2021-02-26 DIAGNOSIS — Z7901 Long term (current) use of anticoagulants: Secondary | ICD-10-CM | POA: Insufficient documentation

## 2021-02-26 DIAGNOSIS — Z7189 Other specified counseling: Secondary | ICD-10-CM | POA: Diagnosis not present

## 2021-02-26 LAB — CMP (CANCER CENTER ONLY)
ALT: 13 U/L (ref 0–44)
AST: 21 U/L (ref 15–41)
Albumin: 3.4 g/dL — ABNORMAL LOW (ref 3.5–5.0)
Alkaline Phosphatase: 47 U/L (ref 38–126)
Anion gap: 9 (ref 5–15)
BUN: 29 mg/dL — ABNORMAL HIGH (ref 8–23)
CO2: 26 mmol/L (ref 22–32)
Calcium: 9.7 mg/dL (ref 8.9–10.3)
Chloride: 104 mmol/L (ref 98–111)
Creatinine: 1.24 mg/dL — ABNORMAL HIGH (ref 0.44–1.00)
GFR, Estimated: 49 mL/min — ABNORMAL LOW (ref >60)
Glucose, Bld: 101 mg/dL — ABNORMAL HIGH (ref 70–99)
Potassium: 3.9 mmol/L (ref 3.5–5.1)
Sodium: 139 mmol/L (ref 135–145)
Total Bilirubin: 0.3 mg/dL (ref 0.3–1.2)
Total Protein: 7.9 g/dL (ref 6.5–8.1)

## 2021-02-26 LAB — CBC WITH DIFFERENTIAL (CANCER CENTER ONLY)
Abs Immature Granulocytes: 0.03 10*3/uL (ref 0.00–0.07)
Basophils Absolute: 0 10*3/uL (ref 0.0–0.1)
Basophils Relative: 1 %
Eosinophils Absolute: 0.1 10*3/uL (ref 0.0–0.5)
Eosinophils Relative: 2 %
HCT: 30.3 % — ABNORMAL LOW (ref 36.0–46.0)
Hemoglobin: 9.4 g/dL — ABNORMAL LOW (ref 12.0–15.0)
Immature Granulocytes: 1 %
Lymphocytes Relative: 30 %
Lymphs Abs: 1.8 10*3/uL (ref 0.7–4.0)
MCH: 29.3 pg (ref 26.0–34.0)
MCHC: 31 g/dL (ref 30.0–36.0)
MCV: 94.4 fL (ref 80.0–100.0)
Monocytes Absolute: 0.7 10*3/uL (ref 0.1–1.0)
Monocytes Relative: 12 %
Neutro Abs: 3.3 10*3/uL (ref 1.7–7.7)
Neutrophils Relative %: 54 %
Platelet Count: 184 10*3/uL (ref 150–400)
RBC: 3.21 MIL/uL — ABNORMAL LOW (ref 3.87–5.11)
RDW: 13.1 % (ref 11.5–15.5)
WBC Count: 6 10*3/uL (ref 4.0–10.5)
nRBC: 0 % (ref 0.0–0.2)

## 2021-02-26 MED ORDER — SODIUM CHLORIDE 0.9 % IV SOLN
Freq: Once | INTRAVENOUS | Status: AC
Start: 2021-02-26 — End: 2021-02-26
  Filled 2021-02-26: qty 250

## 2021-02-26 MED ORDER — TRASTUZUMAB-ANNS CHEMO 150 MG IV SOLR
6.0000 mg/kg | Freq: Once | INTRAVENOUS | Status: AC
  Administered 2021-02-26: 525 mg via INTRAVENOUS
  Filled 2021-02-26: qty 25

## 2021-02-26 MED ORDER — EPOETIN ALFA-EPBX 40000 UNIT/ML IJ SOLN
40000.0000 [IU] | Freq: Once | INTRAMUSCULAR | Status: AC
  Administered 2021-02-26: 40000 [IU] via SUBCUTANEOUS

## 2021-02-26 MED ORDER — DIPHENHYDRAMINE HCL 25 MG PO CAPS
25.0000 mg | ORAL_CAPSULE | Freq: Once | ORAL | Status: AC
  Administered 2021-02-26: 25 mg via ORAL

## 2021-02-26 MED ORDER — ACETAMINOPHEN 325 MG PO TABS
ORAL_TABLET | ORAL | Status: AC
  Filled 2021-02-26: qty 2

## 2021-02-26 MED ORDER — SODIUM CHLORIDE 0.9% FLUSH
10.0000 mL | Freq: Once | INTRAVENOUS | Status: AC
Start: 2021-02-26 — End: 2021-02-26
  Administered 2021-02-26: 10 mL
  Filled 2021-02-26: qty 10

## 2021-02-26 MED ORDER — ACETAMINOPHEN 325 MG PO TABS
650.0000 mg | ORAL_TABLET | Freq: Once | ORAL | Status: AC
  Administered 2021-02-26: 650 mg via ORAL

## 2021-02-26 MED ORDER — EPOETIN ALFA-EPBX 40000 UNIT/ML IJ SOLN
INTRAMUSCULAR | Status: AC
  Filled 2021-02-26: qty 1

## 2021-02-26 MED ORDER — DIPHENHYDRAMINE HCL 25 MG PO CAPS
ORAL_CAPSULE | ORAL | Status: AC
  Filled 2021-02-26: qty 2

## 2021-02-26 NOTE — Patient Instructions (Signed)
Implanted Port Insertion, Care After This sheet gives you information about how to care for yourself after your procedure. Your health care provider may also give you more specific instructions. If you have problems or questions, contact your health care provider. What can I expect after the procedure? After the procedure, it is common to have:  Discomfort at the port insertion site.  Bruising on the skin over the port. This should improve over 3-4 days. Follow these instructions at home: Port care  After your port is placed, you will get a manufacturer's information card. The card has information about your port. Keep this card with you at all times.  Take care of the port as told by your health care provider. Ask your health care provider if you or a family member can get training for taking care of the port at home. A home health care nurse may also take care of the port.  Make sure to remember what type of port you have. Incision care  Follow instructions from your health care provider about how to take care of your port insertion site. Make sure you: ? Wash your hands with soap and water before and after you change your bandage (dressing). If soap and water are not available, use hand sanitizer. ? Change your dressing as told by your health care provider. ? Leave stitches (sutures), skin glue, or adhesive strips in place. These skin closures may need to stay in place for 2 weeks or longer. If adhesive strip edges start to loosen and curl up, you may trim the loose edges. Do not remove adhesive strips completely unless your health care provider tells you to do that.  Check your port insertion site every day for signs of infection. Check for: ? Redness, swelling, or pain. ? Fluid or blood. ? Warmth. ? Pus or a bad smell.      Activity  Return to your normal activities as told by your health care provider. Ask your health care provider what activities are safe for you.  Do not  lift anything that is heavier than 10 lb (4.5 kg), or the limit that you are told, until your health care provider says that it is safe. General instructions  Take over-the-counter and prescription medicines only as told by your health care provider.  Do not take baths, swim, or use a hot tub until your health care provider approves. Ask your health care provider if you may take showers. You may only be allowed to take sponge baths.  Do not drive for 24 hours if you were given a sedative during your procedure.  Wear a medical alert bracelet in case of an emergency. This will tell any health care providers that you have a port.  Keep all follow-up visits as told by your health care provider. This is important. Contact a health care provider if:  You cannot flush your port with saline as directed, or you cannot draw blood from the port.  You have a fever or chills.  You have redness, swelling, or pain around your port insertion site.  You have fluid or blood coming from your port insertion site.  Your port insertion site feels warm to the touch.  You have pus or a bad smell coming from the port insertion site. Get help right away if:  You have chest pain or shortness of breath.  You have bleeding from your port that you cannot control. Summary  Take care of the port as told by your   health care provider. Keep the manufacturer's information card with you at all times.  Change your dressing as told by your health care provider.  Contact a health care provider if you have a fever or chills or if you have redness, swelling, or pain around your port insertion site.  Keep all follow-up visits as told by your health care provider. This information is not intended to replace advice given to you by your health care provider. Make sure you discuss any questions you have with your health care provider. Document Revised: 05/03/2018 Document Reviewed: 05/03/2018 Elsevier Patient Education   2021 [employer]vier Inc.  

## 2021-02-26 NOTE — Patient Instructions (Signed)
[facility] °Discharge Instructions for Patients Receiving Chemotherapy ° °Today you received the following chemotherapy agents Trastuzumab ° °To help prevent nausea and vomiting after your treatment, we encourage you to take your nausea medication as directed. °  °If you develop nausea and vomiting that is not controlled by your nausea medication, call the clinic.  ° °BELOW ARE SYMPTOMS THAT SHOULD BE REPORTED IMMEDIATELY: °· *FEVER GREATER THAN 100.5 F °· *CHILLS WITH OR WITHOUT FEVER °· NAUSEA AND VOMITING THAT IS NOT CONTROLLED WITH YOUR NAUSEA MEDICATION °· *UNUSUAL SHORTNESS OF BREATH °· *UNUSUAL BRUISING OR BLEEDING °· TENDERNESS IN MOUTH AND THROAT WITH OR WITHOUT PRESENCE OF ULCERS °· *URINARY PROBLEMS °· *BOWEL PROBLEMS °· UNUSUAL RASH °Items with * indicate a potential emergency and should be followed up as soon as possible. ° °Feel free to call the clinic should you have any questions or concerns. The clinic phone number is [phone]. ° °Please show the CHEMO ALERT CARD at check-in to the Emergency Department and triage nurse. ° ° °

## 2021-02-27 ENCOUNTER — Ambulatory Visit: Payer: 59

## 2021-02-27 ENCOUNTER — Other Ambulatory Visit: Payer: Self-pay

## 2021-02-27 ENCOUNTER — Ambulatory Visit: Payer: 59 | Admitting: Occupational Therapy

## 2021-02-27 ENCOUNTER — Encounter: Payer: Self-pay | Admitting: Occupational Therapy

## 2021-02-27 DIAGNOSIS — R2681 Unsteadiness on feet: Secondary | ICD-10-CM

## 2021-02-27 DIAGNOSIS — R2689 Other abnormalities of gait and mobility: Secondary | ICD-10-CM

## 2021-02-27 DIAGNOSIS — M79602 Pain in left arm: Secondary | ICD-10-CM

## 2021-02-27 DIAGNOSIS — M79642 Pain in left hand: Secondary | ICD-10-CM | POA: Diagnosis not present

## 2021-02-27 DIAGNOSIS — M25641 Stiffness of right hand, not elsewhere classified: Secondary | ICD-10-CM

## 2021-02-27 DIAGNOSIS — M79641 Pain in right hand: Secondary | ICD-10-CM

## 2021-02-27 DIAGNOSIS — M6281 Muscle weakness (generalized): Secondary | ICD-10-CM

## 2021-02-27 DIAGNOSIS — M25642 Stiffness of left hand, not elsewhere classified: Secondary | ICD-10-CM
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
## 2021-03-02 LAB — ANCA PROFILE (RDL)
ANCA by IFA (RDL): POSITIVE — AB
Anti-MPO Ab (RDL): <20 Units (ref <20)
Anti-PR-3 Ab (RDL): 31 Units — ABNORMAL HIGH (ref <20)

## 2021-03-02 LAB — POSITIVE ANCA REFLEX
Anti-Nuclear Ab by IFA (RDL): POSITIVE — AB
C-ANCA (IFA-ETOH): 1:10 {titer}
P-ANCA (IFA-ETOH): <1:10 {titer}
P-ANCA (IFA-Formalin): NEGATIVE

## 2021-03-02 LAB — ANA PANEL FOR ANCA: Speckled Pattern: 1:320 {titer} — ABNORMAL HIGH

## 2021-03-02 LAB — SPECIMEN STATUS REPORT

## 2021-03-04 ENCOUNTER — Encounter: Payer: Self-pay | Admitting: Physical Therapy

## 2021-03-04 ENCOUNTER — Ambulatory Visit: Payer: 59 | Admitting: Physical Therapy

## 2021-03-04 ENCOUNTER — Other Ambulatory Visit: Payer: Self-pay

## 2021-03-04 ENCOUNTER — Telehealth: Payer: Self-pay | Admitting: Oncology

## 2021-03-04 ENCOUNTER — Ambulatory Visit: Payer: 59 | Admitting: Occupational Therapy

## 2021-03-04 ENCOUNTER — Encounter: Payer: Self-pay | Admitting: Occupational Therapy

## 2021-03-04 DIAGNOSIS — M79602 Pain in left arm: Secondary | ICD-10-CM

## 2021-03-04 DIAGNOSIS — M79642 Pain in left hand: Secondary | ICD-10-CM

## 2021-03-04 DIAGNOSIS — M25642 Stiffness of left hand, not elsewhere classified: Secondary | ICD-10-CM

## 2021-03-04 DIAGNOSIS — R2681 Unsteadiness on feet: Secondary | ICD-10-CM

## 2021-03-04 DIAGNOSIS — M6281 Muscle weakness (generalized): Secondary | ICD-10-CM

## 2021-03-04 DIAGNOSIS — M79641 Pain in right hand: Secondary | ICD-10-CM
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
## 2021-03-04 NOTE — Telephone Encounter (Signed)
Scheduled appointment per 05/11 los. Patient will receive calender.

## 2021-03-05 ENCOUNTER — Ambulatory Visit: Payer: 59 | Admitting: Internal Medicine

## 2021-03-05 ENCOUNTER — Encounter: Payer: Self-pay | Admitting: Internal Medicine

## 2021-03-05 VITALS — BP 130/82 | HR 57 | Temp 98.3°F | Ht 59.0 in | Wt 195.0 lb

## 2021-03-05 DIAGNOSIS — G619 Inflammatory polyneuropathy, unspecified: Secondary | ICD-10-CM

## 2021-03-05 DIAGNOSIS — E7800 Pure hypercholesterolemia, unspecified: Secondary | ICD-10-CM

## 2021-03-05 DIAGNOSIS — E119 Type 2 diabetes mellitus without complications: Secondary | ICD-10-CM

## 2021-03-05 DIAGNOSIS — I10 Essential (primary) hypertension: Secondary | ICD-10-CM | POA: Diagnosis not present

## 2021-03-05 LAB — LIPID PANEL
Cholesterol: 195 mg/dL (ref 0–200)
HDL: 71.6 mg/dL (ref >39.00)
LDL Cholesterol: 110 mg/dL — ABNORMAL HIGH (ref 0–99)
NonHDL: 123.26
Total CHOL/HDL Ratio: 3
Triglycerides: 64 mg/dL (ref 0.0–149.0)
VLDL: 12.8 mg/dL (ref 0.0–40.0)

## 2021-03-05 LAB — HEMOGLOBIN A1C: Hgb A1c MFr Bld: 5.9 % (ref 4.6–6.5)

## 2021-03-05 MED ORDER — PREDNISONE 10 MG PO TABS: ORAL_TABLET | ORAL | 1 refills | Status: DC

## 2021-03-05 NOTE — Patient Instructions (Signed)
Please take all new medication as prescribed - the prednisone  Please continue all other medications as before  Please have the pharmacy call with any other refills you may need.  Please continue your efforts at being more active, low cholesterol diet, and weight control.  Please keep your appointments with your specialists as you may have planned - rheumatology june 28  Please go to the LAB at the blood drawing area for the tests to be done  You will be contacted by phone if any changes need to be made immediately.  Otherwise, you will receive a letter about your results with an explanation, but please check with MyChart first.  Please remember to sign up for MyChart if you have not done so, as this will be important to you in the future with finding out test results, communicating by private email, and scheduling acute appointments online when needed.  Please make an Appointment to return in 6 months, or sooner if needed
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
## 2021-03-06 ENCOUNTER — Encounter: Payer: Self-pay | Admitting: Occupational Therapy

## 2021-03-06 ENCOUNTER — Ambulatory Visit: Payer: 59

## 2021-03-06 ENCOUNTER — Other Ambulatory Visit: Payer: Self-pay

## 2021-03-06 ENCOUNTER — Ambulatory Visit: Payer: 59 | Admitting: Occupational Therapy

## 2021-03-06 DIAGNOSIS — M79642 Pain in left hand: Secondary | ICD-10-CM | POA: Diagnosis not present

## 2021-03-06 DIAGNOSIS — R2681 Unsteadiness on feet: Secondary | ICD-10-CM

## 2021-03-06 DIAGNOSIS — M25641 Stiffness of right hand, not elsewhere classified: Secondary | ICD-10-CM

## 2021-03-06 DIAGNOSIS — M25642 Stiffness of left hand, not elsewhere classified: Secondary | ICD-10-CM

## 2021-03-06 DIAGNOSIS — M79641 Pain in right hand: Secondary | ICD-10-CM

## 2021-03-06 DIAGNOSIS — M6281 Muscle weakness (generalized): Secondary | ICD-10-CM

## 2021-03-06 DIAGNOSIS — R2689 Other abnormalities of gait and mobility: Secondary | ICD-10-CM

## 2021-03-06 DIAGNOSIS — M79602 Pain in left arm: Secondary | ICD-10-CM
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
## 2021-03-08 ENCOUNTER — Encounter: Payer: Self-pay | Admitting: Internal Medicine

## 2021-03-08 NOTE — Assessment & Plan Note (Signed)
Severe rapidly worsening, has rheumatology appt pending about mid June 2022,, now for pred 10 qd

## 2021-03-08 NOTE — Assessment & Plan Note (Signed)
Lab Results  Component Value Date   LDLCALC 110 (H) 03/05/2021   Stable, pt to continue current statin pravachol - declines change for now

## 2021-03-08 NOTE — Assessment & Plan Note (Signed)
BP Readings from Last 3 Encounters:  03/05/21 130/82  02/26/21 116/61  02/21/21 116/76   Stable, pt to continue medical treatment coreg micardis hct

## 2021-03-08 NOTE — Assessment & Plan Note (Signed)
Lab Results  Component Value Date   HGBA1C 5.9 03/05/2021   Stable, pt to continue current medical treatment  - diet

## 2021-03-11 ENCOUNTER — Encounter: Payer: Self-pay | Admitting: Occupational Therapy

## 2021-03-11 ENCOUNTER — Other Ambulatory Visit: Payer: Self-pay

## 2021-03-11 ENCOUNTER — Ambulatory Visit: Payer: 59 | Admitting: Occupational Therapy

## 2021-03-11 DIAGNOSIS — M25641 Stiffness of right hand, not elsewhere classified: Secondary | ICD-10-CM

## 2021-03-11 DIAGNOSIS — M79602 Pain in left arm: Secondary | ICD-10-CM

## 2021-03-11 DIAGNOSIS — M79641 Pain in right hand: Secondary | ICD-10-CM

## 2021-03-11 DIAGNOSIS — M79642 Pain in left hand: Secondary | ICD-10-CM | POA: Diagnosis not present

## 2021-03-11 DIAGNOSIS — M6281 Muscle weakness (generalized): Secondary | ICD-10-CM

## 2021-03-11 DIAGNOSIS — M25642 Stiffness of left hand, not elsewhere classified: Secondary | ICD-10-CM

## 2021-03-11 DIAGNOSIS — R2681 Unsteadiness on feet: Secondary | ICD-10-CM
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
## 2021-03-13 ENCOUNTER — Other Ambulatory Visit: Payer: Self-pay

## 2021-03-13 ENCOUNTER — Ambulatory Visit: Payer: 59

## 2021-03-13 DIAGNOSIS — M6281 Muscle weakness (generalized): Secondary | ICD-10-CM

## 2021-03-13 DIAGNOSIS — R2689 Other abnormalities of gait and mobility: Secondary | ICD-10-CM

## 2021-03-13 DIAGNOSIS — M79642 Pain in left hand: Secondary | ICD-10-CM | POA: Diagnosis not present

## 2021-03-13 DIAGNOSIS — R2681 Unsteadiness on feet: Secondary | ICD-10-CM

## 2021-03-13 NOTE — Patient Instructions (Signed)
Access Code: [email or web address] URL: [email or web address] Date: 03/13/2021 Prepared by: Sharlynn Oliphant  Exercises Standing March with Unilateral Counter Support - 2 x daily - 7 x weekly - 2 sets - 10 reps Side to Side Weight Shift with Counter Support - 2 x daily - 7 x weekly - 2 sets - 10 reps Forward Backward Weight Shift with Counter Support - 2 x daily - 7 x weekly - 2 sets - 10 reps Standing Near Stance in Corner with Eyes Closed - 2 x daily - 7 x weekly - 1 sets - 2 reps - 30s hold Standing with Head Rotation - 2 x daily - 7 x weekly - 1 sets - 5 reps Standing with Head Nod - 2 x daily - 7 x weekly - 1 sets - 5 reps Half Deadlift with Kettlebell - 2 x daily - 7 x weekly - 1 sets - 5 reps Sit to Stand with Counter Support - 2 x daily - 7 x weekly - 1 sets - 5 reps
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
## 2021-03-19 ENCOUNTER — Other Ambulatory Visit: Payer: Self-pay

## 2021-03-19 ENCOUNTER — Inpatient Hospital Stay: Payer: 59 | Attending: Oncology

## 2021-03-19 ENCOUNTER — Other Ambulatory Visit: Payer: 59

## 2021-03-19 ENCOUNTER — Inpatient Hospital Stay: Payer: 59

## 2021-03-19 VITALS — BP 114/59 | HR 57 | Temp 98.2°F | Resp 18

## 2021-03-19 DIAGNOSIS — Z79899 Other long term (current) drug therapy: Secondary | ICD-10-CM | POA: Diagnosis not present

## 2021-03-19 DIAGNOSIS — Z7901 Long term (current) use of anticoagulants: Secondary | ICD-10-CM | POA: Diagnosis not present

## 2021-03-19 DIAGNOSIS — Z170 Estrogen receptor positive status [ER+]: Secondary | ICD-10-CM | POA: Diagnosis not present

## 2021-03-19 DIAGNOSIS — C50912 Malignant neoplasm of unspecified site of left female breast: Secondary | ICD-10-CM | POA: Diagnosis present

## 2021-03-19 DIAGNOSIS — C50812 Malignant neoplasm of overlapping sites of left female breast: Secondary | ICD-10-CM

## 2021-03-19 DIAGNOSIS — C773 Secondary and unspecified malignant neoplasm of axilla and upper limb lymph nodes: Secondary | ICD-10-CM | POA: Diagnosis present

## 2021-03-19 DIAGNOSIS — C50112 Malignant neoplasm of central portion of left female breast: Secondary | ICD-10-CM

## 2021-03-19 DIAGNOSIS — Z8542 Personal history of malignant neoplasm of other parts of uterus: Secondary | ICD-10-CM | POA: Diagnosis not present

## 2021-03-19 DIAGNOSIS — Z95828 Presence of other vascular implants and grafts: Secondary | ICD-10-CM

## 2021-03-19 DIAGNOSIS — Z171 Estrogen receptor negative status [ER-]: Secondary | ICD-10-CM

## 2021-03-19 DIAGNOSIS — Z86718 Personal history of other venous thrombosis and embolism: Secondary | ICD-10-CM | POA: Insufficient documentation

## 2021-03-19 DIAGNOSIS — Z5112 Encounter for antineoplastic immunotherapy: Secondary | ICD-10-CM | POA: Insufficient documentation

## 2021-03-19 DIAGNOSIS — C771 Secondary and unspecified malignant neoplasm of intrathoracic lymph nodes: Secondary | ICD-10-CM

## 2021-03-19 LAB — CBC WITH DIFFERENTIAL (CANCER CENTER ONLY)
Abs Immature Granulocytes: 0.01 10*3/uL (ref 0.00–0.07)
Basophils Absolute: 0 10*3/uL (ref 0.0–0.1)
Basophils Relative: 0 %
Eosinophils Absolute: 0.1 10*3/uL (ref 0.0–0.5)
Eosinophils Relative: 1 %
HCT: 33.3 % — ABNORMAL LOW (ref 36.0–46.0)
Hemoglobin: 10.5 g/dL — ABNORMAL LOW (ref 12.0–15.0)
Immature Granulocytes: 0 %
Lymphocytes Relative: 27 %
Lymphs Abs: 1.5 10*3/uL (ref 0.7–4.0)
MCH: 29.7 pg (ref 26.0–34.0)
MCHC: 31.5 g/dL (ref 30.0–36.0)
MCV: 94.1 fL (ref 80.0–100.0)
Monocytes Absolute: 0.6 10*3/uL (ref 0.1–1.0)
Monocytes Relative: 11 %
Neutro Abs: 3.4 10*3/uL (ref 1.7–7.7)
Neutrophils Relative %: 61 %
Platelet Count: 175 10*3/uL (ref 150–400)
RBC: 3.54 MIL/uL — ABNORMAL LOW (ref 3.87–5.11)
RDW: 13.8 % (ref 11.5–15.5)
WBC Count: 5.6 10*3/uL (ref 4.0–10.5)
nRBC: 0 % (ref 0.0–0.2)

## 2021-03-19 LAB — CMP (CANCER CENTER ONLY)
ALT: 15 U/L (ref 0–44)
AST: 19 U/L (ref 15–41)
Albumin: 3.3 g/dL — ABNORMAL LOW (ref 3.5–5.0)
Alkaline Phosphatase: 45 U/L (ref 38–126)
Anion gap: 9 (ref 5–15)
BUN: 30 mg/dL — ABNORMAL HIGH (ref 8–23)
CO2: 27 mmol/L (ref 22–32)
Calcium: 10.1 mg/dL (ref 8.9–10.3)
Chloride: 103 mmol/L (ref 98–111)
Creatinine: 1.38 mg/dL — ABNORMAL HIGH (ref 0.44–1.00)
GFR, Estimated: 43 mL/min — ABNORMAL LOW (ref >60)
Glucose, Bld: 117 mg/dL — ABNORMAL HIGH (ref 70–99)
Potassium: 3.7 mmol/L (ref 3.5–5.1)
Sodium: 139 mmol/L (ref 135–145)
Total Bilirubin: 0.3 mg/dL (ref 0.3–1.2)
Total Protein: 8 g/dL (ref 6.5–8.1)

## 2021-03-19 MED ORDER — SODIUM CHLORIDE 0.9 % IV SOLN
6.0000 mg/kg | Freq: Once | INTRAVENOUS | Status: AC
  Administered 2021-03-19: 525 mg via INTRAVENOUS
  Filled 2021-03-19: qty 25

## 2021-03-19 MED ORDER — ACETAMINOPHEN 325 MG PO TABS
ORAL_TABLET | ORAL | Status: AC
  Filled 2021-03-19: qty 2

## 2021-03-19 MED ORDER — SODIUM CHLORIDE 0.9% FLUSH
10.0000 mL | Freq: Once | INTRAVENOUS | Status: AC
  Administered 2021-03-19: 10 mL
  Filled 2021-03-19: qty 10

## 2021-03-19 MED ORDER — SODIUM CHLORIDE 0.9% FLUSH
10.0000 mL | INTRAVENOUS | Status: DC | PRN
  Administered 2021-03-19 (×2): 10 mL
  Filled 2021-03-19: qty 10

## 2021-03-19 MED ORDER — DIPHENHYDRAMINE HCL 25 MG PO CAPS
ORAL_CAPSULE | ORAL | Status: AC
  Filled 2021-03-19: qty 1

## 2021-03-19 MED ORDER — SODIUM CHLORIDE 0.9 % IV SOLN
Freq: Once | INTRAVENOUS | Status: AC
  Filled 2021-03-19: qty 250

## 2021-03-19 MED ORDER — DIPHENHYDRAMINE HCL 25 MG PO CAPS
25.0000 mg | ORAL_CAPSULE | Freq: Once | ORAL | Status: AC
  Administered 2021-03-19: 25 mg via ORAL

## 2021-03-19 MED ORDER — ACETAMINOPHEN 325 MG PO TABS
650.0000 mg | ORAL_TABLET | Freq: Once | ORAL | Status: AC
  Administered 2021-03-19: 650 mg via ORAL

## 2021-03-19 MED ORDER — HEPARIN SOD (PORK) LOCK FLUSH 100 UNIT/ML IV SOLN
500.0000 [IU] | Freq: Once | INTRAVENOUS | Status: AC | PRN
  Administered 2021-03-19: 500 [IU]
  Filled 2021-03-19: qty 5

## 2021-03-19 MED ORDER — EPOETIN ALFA-EPBX 40000 UNIT/ML IJ SOLN: 40000.0000 [IU] | Freq: Once | INTRAMUSCULAR | Status: DC

## 2021-03-19 NOTE — Patient Instructions (Signed)
[facility]  Discharge Instructions: Thank you for choosing [facility] to provide your oncology and hematology care.   If you have a lab appointment with the Cancer Center, please go directly to the Cancer Center and check in at the registration area.   Wear comfortable clothing and clothing appropriate for easy access to any Portacath or PICC line.   We strive to give you quality time with your provider. You may need to reschedule your appointment if you arrive late (15 or more minutes).  Arriving late affects you and other patients whose appointments are after yours.  Also, if you miss three or more appointments without notifying the office, you may be dismissed from the clinic at the provider's discretion.      For prescription refill requests, have your pharmacy contact our office and allow 72 hours for refills to be completed.    Today you received the following chemotherapy and/or immunotherapy agents trastuzumab      To help prevent nausea and vomiting after your treatment, we encourage you to take your nausea medication as directed.  BELOW ARE SYMPTOMS THAT SHOULD BE REPORTED IMMEDIATELY: *FEVER GREATER THAN 100.4 F (38 C) OR HIGHER *CHILLS OR SWEATING *NAUSEA AND VOMITING THAT IS NOT CONTROLLED WITH YOUR NAUSEA MEDICATION *UNUSUAL SHORTNESS OF BREATH *UNUSUAL BRUISING OR BLEEDING *URINARY PROBLEMS (pain or burning when urinating, or frequent urination) *BOWEL PROBLEMS (unusual diarrhea, constipation, pain near the anus) TENDERNESS IN MOUTH AND THROAT WITH OR WITHOUT PRESENCE OF ULCERS (sore throat, sores in mouth, or a toothache) UNUSUAL RASH, SWELLING OR PAIN  UNUSUAL VAGINAL DISCHARGE OR ITCHING   Items with * indicate a potential emergency and should be followed up as soon as possible or go to the Emergency Department if any problems should occur.  Please show the CHEMOTHERAPY ALERT CARD or IMMUNOTHERAPY ALERT CARD at check-in to  the Emergency Department and triage nurse.  Should you have questions after your visit or need to cancel or reschedule your appointment, please contact [facility]  Dept: [phone]  and follow the prompts.  Office hours are 8:00 a.m. to 4:30 p.m. Monday - Friday. Please note that voicemails left after 4:00 p.m. may not be returned until the following business day.  We are closed weekends and major holidays. You have access to a nurse at all times for urgent questions. Please call the main number to the clinic Dept: [phone] and follow the prompts.   For any non-urgent questions, you may also contact your provider using MyChart. We now offer e-Visits for anyone 18 and older to request care online for non-urgent symptoms. For details visit [email or web address].   Also download the MyChart app! Go to the app store, search "MyChart", open the app, select [facility], and log in with your MyChart username and password.  Due to Covid, a mask is required upon entering the hospital/clinic. If you do not have a mask, one will be given to you upon arrival. For doctor visits, patients may have 1 support person aged 18 or older with them. For treatment visits, patients cannot have anyone with them due to current Covid guidelines and our immunocompromised population.   

## 2021-03-20 ENCOUNTER — Telehealth: Payer: Self-pay | Admitting: *Deleted

## 2021-03-20 NOTE — Telephone Encounter (Signed)
This RN returned call to pt per her VM as well as questions she had yesterday per chemo.  Obtained her identified VM- message left answering her questions regarding kidney function, MD appointments and BP concerns.  This RN also requested a call back so this RN can better discuss the issues.

## 2021-03-27 ENCOUNTER — Encounter: Payer: Self-pay | Admitting: Oncology
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
## 2021-04-09 ENCOUNTER — Inpatient Hospital Stay: Payer: 59

## 2021-04-09 ENCOUNTER — Inpatient Hospital Stay: Payer: 59 | Admitting: Oncology

## 2021-04-09 ENCOUNTER — Other Ambulatory Visit: Payer: Self-pay

## 2021-04-09 ENCOUNTER — Ambulatory Visit: Payer: 59

## 2021-04-09 VITALS — BP 129/72 | HR 64 | Temp 97.3°F | Resp 16 | Ht 59.0 in | Wt 198.2 lb

## 2021-04-09 DIAGNOSIS — C771 Secondary and unspecified malignant neoplasm of intrathoracic lymph nodes: Secondary | ICD-10-CM

## 2021-04-09 DIAGNOSIS — Z95828 Presence of other vascular implants and grafts: Secondary | ICD-10-CM

## 2021-04-09 DIAGNOSIS — C50812 Malignant neoplasm of overlapping sites of left female breast: Secondary | ICD-10-CM

## 2021-04-09 DIAGNOSIS — Z5112 Encounter for antineoplastic immunotherapy: Secondary | ICD-10-CM | POA: Diagnosis not present

## 2021-04-09 DIAGNOSIS — C50112 Malignant neoplasm of central portion of left female breast: Secondary | ICD-10-CM

## 2021-04-09 DIAGNOSIS — Z7189 Other specified counseling: Secondary | ICD-10-CM

## 2021-04-09 DIAGNOSIS — Z171 Estrogen receptor negative status [ER-]: Secondary | ICD-10-CM

## 2021-04-09 LAB — CBC WITH DIFFERENTIAL (CANCER CENTER ONLY)
Abs Immature Granulocytes: 0.02 10*3/uL (ref 0.00–0.07)
Basophils Absolute: 0 10*3/uL (ref 0.0–0.1)
Basophils Relative: 0 %
Eosinophils Absolute: 0 10*3/uL (ref 0.0–0.5)
Eosinophils Relative: 1 %
HCT: 31.5 % — ABNORMAL LOW (ref 36.0–46.0)
Hemoglobin: 10.2 g/dL — ABNORMAL LOW (ref 12.0–15.0)
Immature Granulocytes: 0 %
Lymphocytes Relative: 20 %
Lymphs Abs: 1.2 10*3/uL (ref 0.7–4.0)
MCH: 29.4 pg (ref 26.0–34.0)
MCHC: 32.4 g/dL (ref 30.0–36.0)
MCV: 90.8 fL (ref 80.0–100.0)
Monocytes Absolute: 0.6 10*3/uL (ref 0.1–1.0)
Monocytes Relative: 10 %
Neutro Abs: 4.4 10*3/uL (ref 1.7–7.7)
Neutrophils Relative %: 69 %
Platelet Count: 164 10*3/uL (ref 150–400)
RBC: 3.47 MIL/uL — ABNORMAL LOW (ref 3.87–5.11)
RDW: 13.2 % (ref 11.5–15.5)
WBC Count: 6.2 10*3/uL (ref 4.0–10.5)
nRBC: 0 % (ref 0.0–0.2)

## 2021-04-09 LAB — CMP (CANCER CENTER ONLY)
ALT: 14 U/L (ref 0–44)
AST: 20 U/L (ref 15–41)
Albumin: 3 g/dL — ABNORMAL LOW (ref 3.5–5.0)
Alkaline Phosphatase: 42 U/L (ref 38–126)
Anion gap: 8 (ref 5–15)
BUN: 21 mg/dL (ref 8–23)
CO2: 24 mmol/L (ref 22–32)
Calcium: 9.5 mg/dL (ref 8.9–10.3)
Chloride: 106 mmol/L (ref 98–111)
Creatinine: 0.96 mg/dL (ref 0.44–1.00)
GFR, Estimated: >60 mL/min (ref >60)
Glucose, Bld: 111 mg/dL — ABNORMAL HIGH (ref 70–99)
Potassium: 4 mmol/L (ref 3.5–5.1)
Sodium: 138 mmol/L (ref 135–145)
Total Bilirubin: 0.4 mg/dL (ref 0.3–1.2)
Total Protein: 7.5 g/dL (ref 6.5–8.1)

## 2021-04-09 MED ORDER — ACETAMINOPHEN 325 MG PO TABS
ORAL_TABLET | ORAL | Status: AC
  Filled 2021-04-09: qty 2

## 2021-04-09 MED ORDER — DIPHENHYDRAMINE HCL 25 MG PO CAPS
25.0000 mg | ORAL_CAPSULE | Freq: Once | ORAL | Status: AC
  Administered 2021-04-09: 25 mg via ORAL

## 2021-04-09 MED ORDER — SODIUM CHLORIDE 0.9 % IV SOLN
Freq: Once | INTRAVENOUS | Status: AC
Start: 2021-04-09 — End: 2021-04-09
  Filled 2021-04-09: qty 250

## 2021-04-09 MED ORDER — ACETAMINOPHEN 325 MG PO TABS
650.0000 mg | ORAL_TABLET | Freq: Once | ORAL | Status: AC
  Administered 2021-04-09: 650 mg via ORAL

## 2021-04-09 MED ORDER — DIPHENHYDRAMINE HCL 25 MG PO CAPS
ORAL_CAPSULE | ORAL | Status: AC
  Filled 2021-04-09: qty 1

## 2021-04-09 MED ORDER — SODIUM CHLORIDE 0.9 % IV SOLN
6.0000 mg/kg | Freq: Once | INTRAVENOUS | Status: AC
  Administered 2021-04-09: 525 mg via INTRAVENOUS
  Filled 2021-04-09: qty 25

## 2021-04-09 MED ORDER — SODIUM CHLORIDE 0.9% FLUSH
10.0000 mL | INTRAVENOUS | Status: DC | PRN
  Administered 2021-04-09: 10 mL
  Filled 2021-04-09: qty 10

## 2021-04-09 MED ORDER — HEPARIN SOD (PORK) LOCK FLUSH 100 UNIT/ML IV SOLN
500.0000 [IU] | Freq: Once | INTRAVENOUS | Status: AC | PRN
  Administered 2021-04-09: 500 [IU]
  Filled 2021-04-09: qty 5

## 2021-04-09 MED ORDER — SODIUM CHLORIDE 0.9% FLUSH
10.0000 mL | Freq: Once | INTRAVENOUS | Status: AC
  Administered 2021-04-09: 10 mL
  Filled 2021-04-09: qty 10

## 2021-04-09 NOTE — Patient Instructions (Signed)
[facility]  Discharge Instructions: Thank you for choosing [facility] to provide your oncology and hematology care.   If you have a lab appointment with the Cancer Center, please go directly to the Cancer Center and check in at the registration area.   Wear comfortable clothing and clothing appropriate for easy access to any Portacath or PICC line.   We strive to give you quality time with your provider. You may need to reschedule your appointment if you arrive late (15 or more minutes).  Arriving late affects you and other patients whose appointments are after yours.  Also, if you miss three or more appointments without notifying the office, you may be dismissed from the clinic at the provider's discretion.      For prescription refill requests, have your pharmacy contact our office and allow 72 hours for refills to be completed.    Today you received the following chemotherapy and/or immunotherapy agents trastuzumab      To help prevent nausea and vomiting after your treatment, we encourage you to take your nausea medication as directed.  BELOW ARE SYMPTOMS THAT SHOULD BE REPORTED IMMEDIATELY: *FEVER GREATER THAN 100.4 F (38 C) OR HIGHER *CHILLS OR SWEATING *NAUSEA AND VOMITING THAT IS NOT CONTROLLED WITH YOUR NAUSEA MEDICATION *UNUSUAL SHORTNESS OF BREATH *UNUSUAL BRUISING OR BLEEDING *URINARY PROBLEMS (pain or burning when urinating, or frequent urination) *BOWEL PROBLEMS (unusual diarrhea, constipation, pain near the anus) TENDERNESS IN MOUTH AND THROAT WITH OR WITHOUT PRESENCE OF ULCERS (sore throat, sores in mouth, or a toothache) UNUSUAL RASH, SWELLING OR PAIN  UNUSUAL VAGINAL DISCHARGE OR ITCHING   Items with * indicate a potential emergency and should be followed up as soon as possible or go to the Emergency Department if any problems should occur.  Please show the CHEMOTHERAPY ALERT CARD or IMMUNOTHERAPY ALERT CARD at check-in to  the Emergency Department and triage nurse.  Should you have questions after your visit or need to cancel or reschedule your appointment, please contact [facility]  Dept: [phone]  and follow the prompts.  Office hours are 8:00 a.m. to 4:30 p.m. Monday - Friday. Please note that voicemails left after 4:00 p.m. may not be returned until the following business day.  We are closed weekends and major holidays. You have access to a nurse at all times for urgent questions. Please call the main number to the clinic Dept: [phone] and follow the prompts.   For any non-urgent questions, you may also contact your provider using MyChart. We now offer e-Visits for anyone 18 and older to request care online for non-urgent symptoms. For details visit [email or web address].   Also download the MyChart app! Go to the app store, search "MyChart", open the app, select [facility], and log in with your MyChart username and password.  Due to Covid, a mask is required upon entering the hospital/clinic. If you do not have a mask, one will be given to you upon arrival. For doctor visits, patients may have 1 support person aged 18 or older with them. For treatment visits, patients cannot have anyone with them due to current Covid guidelines and our immunocompromised population.   

## 2021-04-15 ENCOUNTER — Other Ambulatory Visit: Payer: Self-pay

## 2021-04-15 ENCOUNTER — Ambulatory Visit (INDEPENDENT_AMBULATORY_CARE_PROVIDER_SITE_OTHER): Payer: 59 | Admitting: Rheumatology

## 2021-04-15 ENCOUNTER — Encounter: Payer: Self-pay | Admitting: Rheumatology

## 2021-04-15 VITALS — BP 123/83 | HR 67 | Ht 59.75 in | Wt 196.8 lb

## 2021-04-15 DIAGNOSIS — L97311 Non-pressure chronic ulcer of right ankle limited to breakdown of skin: Secondary | ICD-10-CM

## 2021-04-15 DIAGNOSIS — E1142 Type 2 diabetes mellitus with diabetic polyneuropathy: Secondary | ICD-10-CM

## 2021-04-15 DIAGNOSIS — C771 Secondary and unspecified malignant neoplasm of intrathoracic lymph nodes: Secondary | ICD-10-CM

## 2021-04-15 DIAGNOSIS — C541 Malignant neoplasm of endometrium: Secondary | ICD-10-CM

## 2021-04-15 DIAGNOSIS — R768 Other specified abnormal immunological findings in serum: Secondary | ICD-10-CM

## 2021-04-15 DIAGNOSIS — K760 Fatty (change of) liver, not elsewhere classified: Secondary | ICD-10-CM

## 2021-04-15 DIAGNOSIS — I10 Essential (primary) hypertension: Secondary | ICD-10-CM

## 2021-04-15 DIAGNOSIS — G629 Polyneuropathy, unspecified: Secondary | ICD-10-CM

## 2021-04-15 DIAGNOSIS — C50812 Malignant neoplasm of overlapping sites of left female breast: Secondary | ICD-10-CM

## 2021-04-15 DIAGNOSIS — M5000 Cervical disc disorder with myelopathy, unspecified cervical region: Secondary | ICD-10-CM

## 2021-04-15 DIAGNOSIS — Z8639 Personal history of other endocrine, nutritional and metabolic disease: Secondary | ICD-10-CM

## 2021-04-15 DIAGNOSIS — M76821 Posterior tibial tendinitis, right leg: Secondary | ICD-10-CM

## 2021-04-15 DIAGNOSIS — I872 Venous insufficiency (chronic) (peripheral): Secondary | ICD-10-CM

## 2021-04-15 DIAGNOSIS — G5603 Carpal tunnel syndrome, bilateral upper limbs: Secondary | ICD-10-CM | POA: Diagnosis not present

## 2021-04-15 DIAGNOSIS — R7689 Other specified abnormal immunological findings in serum: Secondary | ICD-10-CM

## 2021-04-15 DIAGNOSIS — Z95828 Presence of other vascular implants and grafts: Secondary | ICD-10-CM

## 2021-04-15 DIAGNOSIS — I700 Atherosclerosis of aorta: Secondary | ICD-10-CM

## 2021-04-15 DIAGNOSIS — G4733 Obstructive sleep apnea (adult) (pediatric): Secondary | ICD-10-CM

## 2021-04-15 DIAGNOSIS — Z8719 Personal history of other diseases of the digestive system: Secondary | ICD-10-CM

## 2021-04-15 DIAGNOSIS — Z86718 Personal history of other venous thrombosis and embolism: Secondary | ICD-10-CM

## 2021-04-15 DIAGNOSIS — K429 Umbilical hernia without obstruction or gangrene: Secondary | ICD-10-CM

## 2021-04-15 DIAGNOSIS — Z171 Estrogen receptor negative status [ER-]: Secondary | ICD-10-CM

## 2021-04-15 DIAGNOSIS — M791 Myalgia, unspecified site: Secondary | ICD-10-CM

## 2021-04-15 DIAGNOSIS — E119 Type 2 diabetes mellitus without complications: Secondary | ICD-10-CM

## 2021-04-15 DIAGNOSIS — M6701 Short Achilles tendon (acquired), right ankle: Secondary | ICD-10-CM

## 2021-04-17 LAB — PAN-ANCA
ANCA Screen: NEGATIVE
Myeloperoxidase Abs: <1 AI
Serine Protease 3: <1 AI

## 2021-04-17 LAB — HEPATITIS C ANTIBODY
Hepatitis C Ab: NONREACTIVE
SIGNAL TO CUT-OFF: 0.02 (ref <1.00)

## 2021-04-17 LAB — HEPATITIS B SURFACE ANTIGEN: Hepatitis B Surface Ag: NONREACTIVE

## 2021-04-17 LAB — HIV ANTIBODY (ROUTINE TESTING W REFLEX): HIV 1&2 Ab, 4th Generation: NONREACTIVE

## 2021-04-17 LAB — CARDIOLIPIN ANTIBODIES, IGG, IGM, IGA
Anticardiolipin IgA: <2 APL-U/mL
Anticardiolipin IgG: <2 GPL-U/mL
Anticardiolipin IgM: <2 MPL-U/mL

## 2021-04-17 LAB — ANTI-DNA ANTIBODY, DOUBLE-STRANDED: ds DNA Ab: 2 IU/mL

## 2021-04-17 LAB — BETA-2 GLYCOPROTEIN ANTIBODIES
Beta-2 Glyco 1 IgA: <2 U/mL
Beta-2 Glyco 1 IgM: <2 U/mL
Beta-2 Glyco I IgG: <2 U/mL

## 2021-04-17 LAB — RNP ANTIBODY: Ribonucleic Protein(ENA) Antibody, IgG: <1 AI

## 2021-04-17 LAB — ANTI-SMITH ANTIBODY: ENA SM Ab Ser-aCnc: <1 AI

## 2021-04-17 LAB — ANA: Anti Nuclear Antibody (ANA): NEGATIVE

## 2021-04-17 LAB — CK: Total CK: 225 U/L — ABNORMAL HIGH (ref 29–143)

## 2021-04-17 LAB — C3 AND C4
C3 Complement: 165 mg/dL (ref 83–193)
C4 Complement: 33 mg/dL (ref 15–57)

## 2021-04-17 LAB — HEPATITIS B CORE ANTIBODY, IGM: Hep B C IgM: NONREACTIVE

## 2021-04-17 LAB — SEDIMENTATION RATE: Sed Rate: 87 mm/h — ABNORMAL HIGH (ref 0–30)

## 2021-04-18 ENCOUNTER — Telehealth: Payer: Self-pay

## 2021-04-18 NOTE — Telephone Encounter (Signed)
Patient called stating she was returning Dr. Arlean Hopping call.

## 2021-04-18 NOTE — Telephone Encounter (Signed)
I returned patient's call and discussed lab results with her.  FTA antibodies the only lab which is pending at this time.  We will call her with the results when available.  All the autoimmune labs were negative.  I discussed my conversation with Dr. Posey Pronto with the patient.  She will follow-up with Dr. Posey Pronto.

## 2021-04-21 ENCOUNTER — Encounter (HOSPITAL_COMMUNITY): Payer: Self-pay | Admitting: Emergency Medicine

## 2021-04-21 ENCOUNTER — Other Ambulatory Visit: Payer: Self-pay

## 2021-04-21 ENCOUNTER — Emergency Department (HOSPITAL_COMMUNITY)
Admission: EM | Admit: 2021-04-21 | Discharge: 2021-04-21 | Disposition: A | Discharge status: Home / Self Care | Payer: 59 | Attending: Emergency Medicine | Admitting: Emergency Medicine

## 2021-04-21 DIAGNOSIS — Z79899 Other long term (current) drug therapy: Secondary | ICD-10-CM | POA: Insufficient documentation

## 2021-04-21 DIAGNOSIS — I110 Hypertensive heart disease with heart failure: Secondary | ICD-10-CM | POA: Diagnosis not present

## 2021-04-21 DIAGNOSIS — Z794 Long term (current) use of insulin: Secondary | ICD-10-CM | POA: Insufficient documentation

## 2021-04-21 DIAGNOSIS — E119 Type 2 diabetes mellitus without complications: Secondary | ICD-10-CM | POA: Insufficient documentation

## 2021-04-21 DIAGNOSIS — I509 Heart failure, unspecified: Secondary | ICD-10-CM | POA: Insufficient documentation

## 2021-04-21 DIAGNOSIS — Z8542 Personal history of malignant neoplasm of other parts of uterus: Secondary | ICD-10-CM | POA: Insufficient documentation

## 2021-04-21 DIAGNOSIS — Z9011 Acquired absence of right breast and nipple: Secondary | ICD-10-CM | POA: Insufficient documentation

## 2021-04-21 DIAGNOSIS — Z853 Personal history of malignant neoplasm of breast: Secondary | ICD-10-CM | POA: Diagnosis not present

## 2021-04-21 DIAGNOSIS — Z96652 Presence of left artificial knee joint: Secondary | ICD-10-CM | POA: Insufficient documentation

## 2021-04-21 DIAGNOSIS — K122 Cellulitis and abscess of mouth: Secondary | ICD-10-CM | POA: Diagnosis present

## 2021-04-21 DIAGNOSIS — M272 Inflammatory conditions of jaws: Secondary | ICD-10-CM

## 2021-04-21 MED ORDER — CHLORHEXIDINE GLUCONATE 0.12 % MT SOLN: 15.0000 mL | Freq: Two times a day (BID) | OROMUCOSAL | 0 refills | Status: DC

## 2021-04-21 MED ORDER — AMOXICILLIN-POT CLAVULANATE 875-125 MG PO TABS
1.0000 | ORAL_TABLET | Freq: Once | ORAL | Status: AC
  Administered 2021-04-21: 1 via ORAL
  Filled 2021-04-21: qty 1

## 2021-04-21 MED ORDER — AMOXICILLIN-POT CLAVULANATE 875-125 MG PO TABS: 1.0000 | ORAL_TABLET | Freq: Two times a day (BID) | ORAL | 0 refills | Status: DC

## 2021-04-21 NOTE — Discharge Instructions (Addendum)
Warm salt water rinses as needed.  Soft diet.  Follow-up with your doctor.  We are starting you on some antibiotics and some antibiotic oral rinse.  Return if any worsening or concerning symptoms.  Please see a dentist as soon as possible.

## 2021-04-21 NOTE — ED Triage Notes (Signed)
Patient arrives complaining of an upper oral abscess that is draining. Patient is currently being treated for metastatic breast cancer.
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
## 2021-04-22 ENCOUNTER — Ambulatory Visit: Payer: 59 | Admitting: Internal Medicine

## 2021-04-22 ENCOUNTER — Other Ambulatory Visit (HOSPITAL_COMMUNITY): Payer: Self-pay | Admitting: Cardiology

## 2021-04-22 ENCOUNTER — Encounter: Payer: Self-pay | Admitting: Internal Medicine

## 2021-04-22 ENCOUNTER — Telehealth: Payer: Self-pay | Admitting: *Deleted

## 2021-04-22 VITALS — BP 108/60 | HR 55 | Temp 98.4°F | Ht 59.75 in | Wt 196.0 lb

## 2021-04-22 DIAGNOSIS — K122 Cellulitis and abscess of mouth: Secondary | ICD-10-CM

## 2021-04-22 DIAGNOSIS — Z7901 Long term (current) use of anticoagulants: Secondary | ICD-10-CM

## 2021-04-22 DIAGNOSIS — E119 Type 2 diabetes mellitus without complications: Secondary | ICD-10-CM | POA: Diagnosis not present

## 2021-04-22 DIAGNOSIS — I10 Essential (primary) hypertension: Secondary | ICD-10-CM | POA: Diagnosis not present

## 2021-04-22 NOTE — Assessment & Plan Note (Signed)
With mild worsening I suspect, to continue the augmentin, but for sinus CT and refer ENT asap

## 2021-04-22 NOTE — Assessment & Plan Note (Signed)
I asked pt to hold xarelto tomorrow, as hopefully will see ENT and get further guidance

## 2021-04-22 NOTE — Telephone Encounter (Signed)
Pt left a VM stating visit to the ER yesterday due to a " abscess on the roof of my mouth."  She is wanting Dr Jana Hakim to be aware of above and asked " is this a side effect of the medicine I am on" " " I was advised to follow up with a dentist - is that what you think I should do ?"  This RN returned call and obtained pt's VM-message left informing mouth ulcers are not a side effect of herceptin.- and advised her to follow up with dental exam per ER evaluation.  This RN's name given for return call.

## 2021-04-22 NOTE — Assessment & Plan Note (Signed)
BP Readings from Last 3 Encounters:  04/22/21 108/60  04/21/21 99/75  04/15/21 123/83   Stable, pt to continue medical treatment coreg

## 2021-04-22 NOTE — Patient Instructions (Addendum)
Please continue all other medications as before, including the antibiotic, and pain medication  Please have the pharmacy call with any other refills you may need.  Please continue your efforts at being more active, low cholesterol diet, and weight control.  Please keep your appointments with your specialists as you may have planned  You will be contacted regarding the referral for: ENT (ear nose and throat doctor), and CT sinus - ASAP (hopefully tomorrow)  Ok to HOLD the xarelto x 1 day tomorrow to help with the bleeding
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
## 2021-04-22 NOTE — Assessment & Plan Note (Signed)
Lab Results  Component Value Date   HGBA1C 5.9 03/05/2021   Stable, pt to continue current medical treatment  - diet

## 2021-04-24 ENCOUNTER — Ambulatory Visit
Admission: RE | Admit: 2021-04-24 | Discharge: 2021-04-24 | Disposition: A | Discharge status: Home / Self Care | Payer: 59 | Source: Ambulatory Visit | Attending: Internal Medicine | Admitting: Internal Medicine

## 2021-04-24 ENCOUNTER — Other Ambulatory Visit: Payer: Self-pay

## 2021-04-24 ENCOUNTER — Encounter: Payer: Self-pay | Admitting: Internal Medicine

## 2021-04-24 DIAGNOSIS — K122 Cellulitis and abscess of mouth: Secondary | ICD-10-CM

## 2021-04-28 ENCOUNTER — Encounter (HOSPITAL_COMMUNITY): Payer: Self-pay

## 2021-04-30 ENCOUNTER — Ambulatory Visit (HOSPITAL_BASED_OUTPATIENT_CLINIC_OR_DEPARTMENT_OTHER)
Admission: RE | Admit: 2021-04-30 | Discharge: 2021-04-30 | Disposition: A | Discharge status: Home / Self Care | Payer: 59 | Source: Ambulatory Visit | Attending: Cardiology | Admitting: Cardiology

## 2021-04-30 ENCOUNTER — Other Ambulatory Visit: Payer: Self-pay

## 2021-04-30 ENCOUNTER — Ambulatory Visit (HOSPITAL_COMMUNITY)
Admission: RE | Admit: 2021-04-30 | Discharge: 2021-04-30 | Disposition: A | Discharge status: Home / Self Care | Payer: 59 | Source: Ambulatory Visit | Attending: Internal Medicine | Admitting: Internal Medicine

## 2021-04-30 VITALS — BP 124/82 | HR 64 | Ht 59.0 in | Wt 199.0 lb

## 2021-04-30 DIAGNOSIS — G5601 Carpal tunnel syndrome, right upper limb: Secondary | ICD-10-CM | POA: Diagnosis not present

## 2021-04-30 DIAGNOSIS — E119 Type 2 diabetes mellitus without complications: Secondary | ICD-10-CM | POA: Diagnosis not present

## 2021-04-30 DIAGNOSIS — G5602 Carpal tunnel syndrome, left upper limb: Secondary | ICD-10-CM | POA: Diagnosis not present

## 2021-04-30 DIAGNOSIS — E785 Hyperlipidemia, unspecified: Secondary | ICD-10-CM | POA: Insufficient documentation

## 2021-04-30 DIAGNOSIS — Z853 Personal history of malignant neoplasm of breast: Secondary | ICD-10-CM | POA: Insufficient documentation

## 2021-04-30 DIAGNOSIS — I5032 Chronic diastolic (congestive) heart failure: Secondary | ICD-10-CM | POA: Insufficient documentation

## 2021-04-30 DIAGNOSIS — T451X5A Adverse effect of antineoplastic and immunosuppressive drugs, initial encounter: Secondary | ICD-10-CM

## 2021-04-30 DIAGNOSIS — I110 Hypertensive heart disease with heart failure: Secondary | ICD-10-CM | POA: Insufficient documentation

## 2021-04-30 DIAGNOSIS — Z86718 Personal history of other venous thrombosis and embolism: Secondary | ICD-10-CM | POA: Diagnosis not present

## 2021-04-30 DIAGNOSIS — E8589 Other amyloidosis: Secondary | ICD-10-CM

## 2021-04-30 DIAGNOSIS — I427 Cardiomyopathy due to drug and external agent: Secondary | ICD-10-CM | POA: Diagnosis not present

## 2021-04-30 LAB — ECHOCARDIOGRAM COMPLETE
Area-P 1/2: 4.49 cm2
Calc EF: 56.9 %
S' Lateral: 3.3 cm
Single Plane A2C EF: 57.6 %
Single Plane A4C EF: 56.4 %

## 2021-04-30 NOTE — Progress Notes (Signed)
Oncology: Dr. Jana Hakim  65 y.o. with history of DM, HTN, hyperlipidemia, endometrial cancer, and breast cancer was referred by Dr. Jana Hakim for cardio-oncology evaluation.  Initial breast cancer diagnosis was in 2012.  She had left breast lumpectomy and radiation.  Recurrence in 7/20, ER+/PR-/HER2+.  She had chemotherapy with trastuzumab/pertuzumab/carboplatin/docetaxol x 6 starting 2/21.  With fall in EF in 4/21, Herceptin was held.  Echo in 4/21 showed EF 45-50%, Coreg was started. Echo in 6/21 showed EF back up to 55-60%, and Herceptin was restarted.  In 6/21, she had lumpectomy.  Echo in 7/21 showed EF stable at 55-60%.    Echo in 9/21 showed EF 55-60% with normal RV, strain images did not track well.  Coronary CTA was done in 9/21, showing coronary artery calcium score 0 and no significant CAD.    PET in 9/21 with new lymphadenopathy.  She was started on TDM-1/Kadcyla but later changed back to Herceptin which will continue for long-term.   In 12/21, she had a cervical discectomy and fusion.   Echo in 2/22 showed EF 55% with GLS -10.7% but poor endocardial tracing. Normal RV.  Echo in 4/22 showed EF 50-55%, mild LVH, normal RV, GLS -14.3% but poor endocardial tracing. Echo was done today and reviewed, EF 55-60%, mild LVH, mild LAE, GLS -20.1%, normal RV, PASP 32 mmHg.   Patient returns for followup of diastolic CHF.  Pain complaint continues to be pain and burning in hands and feet consistent with neuropathy.  This is quite severe.  She has contractures in her fingers and foot drop.  She is followed by both rheumatology and neurology.  Beyond peripheral neuropathy, she has been given no definite diagnosis.  She walks with a walker and denies exertional dyspnea and chest pain now though she is not very active due to pain.  She has peripheral edema.   Labs (5/21): K 4.1, creatinine 0.89 Labs (7/21): K 4.2, creatinine 0.95 Labs (8/21): LDL 81, BNP 36 Labs (9/21): K 3.7, creatinine 0.94 Labs  (1/22): K 3.2, creatinine 1.14 Labs (4/22): K 4.6, creatinine 1.02 Labs (5/22): negative serum and urine immunofixation Labs (6/22): ESR 87, ANA negative, ANCA negative, CK 225, hgb 10.2, LDL 110, K 3.7, creatinine 1.38  PMH: 1. HTN 2. Diabetes 3. Endometrial carcinoma: 8/17 diagnosis, had hysterectomy and BSO.  4. Hyperlipidemia 5. Breast cancer: Initial diagnosis in 2012.  Had left breast lumpectomy and radiation.  Recurrence in 7/20, ER+/PR-/HER2+.  She had chemotherapy with trastuzumab/pertuzumab/carboplatin/docetaxol x 6 starting 2/21.  With fall in EF in 4/21, Herceptin was held but later restarted.  Lumpectomy 6/21.  - Echo (1/21): EF 60-65%, GLS -20%, normal RV.  - Echo (4/21): EF 45-50%, global hypokinesis, GLS -12.6%, mildly decreased RV function.  - Echo (6/21): EF 55-60%, GLS -14.4%, mild LVH, PASP 38, RV normal.  - Echo (7/21): EF 55-60%, mild LVH, GLS -16.9%, RV  Normal - Echo (9/21): EF 16-07%, grade 2 diastolic dysfunction, GLS -11.6% (poor endocardial tracking), normal RV, normal IVC.  - Echo (2/22): EF 55%, GLS -10.7% (poor endocardial tracking), normal RV.  - Echo (4/22): EF 50-55%, mild LVH, normal RV, GLS -14.3% but poor endocardial tracing. - Echo (7/22): EF 55-60%, mild LVH, mild LAE, GLS -20.1%, normal RV, PASP 32 mmHg.  6. Coronary calcium score scan (8/21): CAC 0 Agatston units.  - Coronary CTA (9/21): CAC 0 Agatston units.  No significant CAD.  7. DVT: Left leg, 8/21.  8. Cervical discectomy and fusion in 12/21.  9. Peripheral neuropathy  10. Carpal tunnel syndrome  Social History   Socioeconomic History   Marital status: Divorced    Spouse name: Not on file   Number of children: 0   Years of education: Not on file   Highest education level: Not on file  Occupational History   Occupation: Education officer, museum    Employer: [employer]    Comment: retired  Tobacco Use   Smoking status: Never   Smokeless tobacco: Never  Scientific laboratory technician Use: Never used   Substance and Sexual Activity   Alcohol use: Not Currently   Drug use: No   Sexual activity: Not Currently  Other Topics Concern   Not on file  Social History Narrative   1 son deceased with homicide   Patient has been divorced twice   Right handed   Drinks caffeine   One story    Social Determinants of Health   Financial Resource Strain: Not on file  Food Insecurity: Not on file  Transportation Needs: Unmet Transportation Needs   Lack of Transportation (Medical): Yes   Lack of Transportation (Non-Medical): Yes  Physical Activity: Not on file  Stress: Not on file  Social Connections: Not on file  Intimate Partner Violence: Not on file   Family History  Problem Relation Age of Onset   Diabetes Mother    Hypertension Mother    Heart disease Father        COPD   Alcohol abuse Father        ETOH dependence   Breast cancer Maternal Aunt        dx in her 37s   Lung cancer Maternal Uncle    Breast cancer Paternal Aunt    Cancer Maternal Grandmother        salivary gland cancer   Colon cancer Neg Hx    ROS: All systems reviewed and negative except as per HPI.   Current Outpatient Medications  Medication Sig Dispense Refill   amoxicillin-clavulanate (AUGMENTIN) 875-125 MG tablet Take 1 tablet by mouth every 12 (twelve) hours. 14 tablet 0   carvedilol (COREG) 6.25 MG tablet Take 1 tablet (6.25 mg total) by mouth 2 (two) times daily. 180 tablet 3   chlorhexidine (PERIDEX) 0.12 % solution Use as directed 15 mLs in the mouth or throat 2 (two) times daily. 120 mL 0   Continuous Blood Gluc Sensor (FREESTYLE LIBRE 14 DAY SENSOR) MISC INJECT 1 SENSOR TO THE SKIN EVERY 14 DAYS FOR CONTINUOUS GLUCOSE MONITORING.     furosemide (LASIX) 40 MG tablet TAKE 1 TABLET BY MOUTH EVERY DAY 30 tablet 3   gabapentin (NEURONTIN) 300 MG capsule Take 2 capsules (600 mg total) by mouth 3 (three) times daily. 480 capsule 1   glucose blood (ONE TOUCH ULTRA TEST) test strip 1 each by Other route 2  (two) times daily. Use to check blood sugars twice a day Dx E11.9 100 each 0   Lancets (ONETOUCH ULTRASOFT) lancets 1 each by Other route 2 (two) times daily. Use to check blood sugars twice a day Dx E11.9 100 each 0   lidocaine-prilocaine (EMLA) cream 4 gram tid prn 120 g 5   omeprazole (PRILOSEC) 40 MG capsule Take 40 mg by mouth daily as needed (Heartburn).     oxyCODONE (OXY IR/ROXICODONE) 5 MG immediate release tablet Take 1 tablet (5 mg total) by mouth every 4 (four) hours as needed for severe pain. 30 tablet 0   potassium chloride SA (KLOR-CON M20) 20 MEQ tablet Take 1  tablet (20 mEq total) by mouth daily. 90 tablet 3   pravastatin (PRAVACHOL) 40 MG tablet Take 1 tablet (40 mg total) by mouth daily. 90 tablet 3   rivaroxaban (XARELTO) 20 MG TABS tablet Take 1 tablet (20 mg total) by mouth daily with supper. 90 tablet 3   silver sulfADIAZINE (SILVADENE) 1 % cream Apply 1 application topically as needed.     telmisartan-hydrochlorothiazide (MICARDIS HCT) 80-12.5 MG tablet Take 1 tablet by mouth daily. 90 tablet 3   No current facility-administered medications for this encounter.   Facility-Administered Medications Ordered in Other Encounters  Medication Dose Route Frequency Provider Last Rate Last Admin   cloNIDine (CATAPRES) tablet 0.1 mg  0.1 mg Oral Daily Tanner, Lucianne Lei E., PA-C   0.1 mg at 11/21/19 1742   BP 124/82   Pulse 64   Ht _0  (1.499 m)   Wt 90.3 kg (199 lb)   LMP 05/18/2016   BMI 40.19 kg/m  General: NAD Neck: No JVD, no thyromegaly or thyroid nodule.  Lungs: Clear to auscultation bilaterally with normal respiratory effort. CV: Nondisplaced PMI.  Heart regular S1/S2, no S3/S4, no murmur.  1+ edema 1/2 to knees bilaterally.  No carotid bruit.  Normal pedal pulses.  Abdomen: Soft, nontender, no hepatosplenomegaly, no distention.  Skin: Intact without lesions or rashes.  Neurologic: Alert and oriented x 3.  Psych: Normal affect. Extremities: No clubbing or cyanosis.   HEENT: Normal.   Assessment/Plan: 1. Chemotherapy-related cardiomyopathy: Fall in EF and strain in the setting of Herceptin use.  Echo in 4/21 with EF 45-50% with GLS -12.6%.  Herceptin was held, Coreg started, and echo in 6/21 showed EF up to 55-60% with GLS -14.4% => Herceptin restarted.  Echo in 7/21 with EF 55-60%, GLS more negative at -16.9%.  Echo in 2/22 and in 9/21 showed normal EF but less negative strain.  However, strain images did not appear to track well on either echo.  Echo in 4/22  showed EF 50-55%, again with poor strain tracking.  Echo today was a better study, EF 55-60% with GLS -20.1% and mild LVH. NYHA class II symptoms, seems most limited by neuropathy.  She does not appear volume overloaded on exam, suspect peripheral edema is due to venous insufficiency.   - Continue Coreg 6.25 mg bid, telmisartan and Lasix daily.   - She should be safe to continue Herceptin.  Repeat echo in 3 months, if stable can then start spreading out screening echoes.  - With painful peripheral neuropathy and carpal tunnel syndrome as well as LVH with diastolic CHF, think we should work her up for amyloidosis.  Urine and serum immunofixations negative, send serum free light chains.  I will try to appeal to get a PYP scan approved, I am unsure why her insurance refused this test initially.   2. HTN: BP controlled.   3. Hyperlipidemia: Keep LDL at least < 100 with diabetes.  - Continue pravastatin.   4. Chest pain: None recently.  Coronary CTA in 9/21 was normal.  5. DVT: on left.   - Continue Xarelto long-term given DVT in setting of malignancy.   Followup in 3 months with echo.   Loralie Champagne 04/30/2021

## 2021-04-30 NOTE — Progress Notes (Signed)
  Echocardiogram 2D Echocardiogram has been performed.  Michiel Cowboy 04/30/2021, 10:42 AM

## 2021-04-30 NOTE — Patient Instructions (Signed)
Labs done today, your results will be available in MyChart, we will contact you for abnormal readings.  You have been ordered a PYP Scan.  This is done in the Radiology Department of [facility].  When you come for this test please plan to be there 2-3 hours. ONCE THIS IS APPROVED BY YOUR INSURANCE COMPANY WE WILL CALL YOU TO SCHEDULE THIS  Your physician recommends that you schedule a follow-up appointment in: 3 months with echocardiogram  If you have any questions or concerns before your next appointment please send [place] a message through [place] or call our office at [phone].    TO LEAVE A MESSAGE FOR THE NURSE SELECT OPTION 2, PLEASE LEAVE A MESSAGE INCLUDING: YOUR NAME DATE OF BIRTH CALL BACK NUMBER REASON FOR CALL**this is important as we prioritize the call backs  YOU WILL RECEIVE A CALL BACK THE SAME DAY AS LONG AS YOU CALL BEFORE 4:00 PM  milAt the [facility], you and your health needs are our priority. As part of our continuing mission to provide you with exceptional heart care, we have created designated Provider Care Teams. These Care Teams include your primary Cardiologist (physician) and Advanced Practice Providers (APPs- Physician Assistants and Nurse Practitioners) who all work together to provide you with the care you need, when you need it.   You may see any of the following providers on your designated Care Team at your next follow up: Dr Glori Bickers Dr Loralie Champagne Dr Patrice Paradise, NP Lyda Jester, [place] Ginnie Smart Audry Riles, PharmD   Please be sure to bring in all your medications bottles to every appointment.

## 2021-05-01 ENCOUNTER — Inpatient Hospital Stay: Payer: 59

## 2021-05-01 ENCOUNTER — Ambulatory Visit: Payer: 59 | Admitting: Oncology

## 2021-05-01 ENCOUNTER — Inpatient Hospital Stay: Payer: 59 | Attending: Oncology

## 2021-05-01 ENCOUNTER — Ambulatory Visit (HOSPITAL_COMMUNITY)
Admission: RE | Admit: 2021-05-01 | Discharge: 2021-05-01 | Disposition: A | Discharge status: Home / Self Care | Payer: 59 | Source: Ambulatory Visit | Attending: Oncology | Admitting: Oncology

## 2021-05-01 ENCOUNTER — Other Ambulatory Visit: Payer: Self-pay | Admitting: *Deleted

## 2021-05-01 VITALS — BP 141/72 | HR 61 | Temp 98.0°F

## 2021-05-01 DIAGNOSIS — T82524A Displacement of infusion catheter, initial encounter: Secondary | ICD-10-CM | POA: Diagnosis present

## 2021-05-01 DIAGNOSIS — Z8542 Personal history of malignant neoplasm of other parts of uterus: Secondary | ICD-10-CM | POA: Insufficient documentation

## 2021-05-01 DIAGNOSIS — C50112 Malignant neoplasm of central portion of left female breast: Secondary | ICD-10-CM

## 2021-05-01 DIAGNOSIS — D6481 Anemia due to antineoplastic chemotherapy: Secondary | ICD-10-CM | POA: Diagnosis not present

## 2021-05-01 DIAGNOSIS — Z171 Estrogen receptor negative status [ER-]: Secondary | ICD-10-CM

## 2021-05-01 DIAGNOSIS — Z452 Encounter for adjustment and management of vascular access device: Secondary | ICD-10-CM | POA: Diagnosis not present

## 2021-05-01 DIAGNOSIS — Z5112 Encounter for antineoplastic immunotherapy: Secondary | ICD-10-CM | POA: Diagnosis present

## 2021-05-01 DIAGNOSIS — Z7901 Long term (current) use of anticoagulants: Secondary | ICD-10-CM | POA: Insufficient documentation

## 2021-05-01 DIAGNOSIS — W19XXXA Unspecified fall, initial encounter: Secondary | ICD-10-CM | POA: Diagnosis not present

## 2021-05-01 DIAGNOSIS — C50412 Malignant neoplasm of upper-outer quadrant of left female breast: Secondary | ICD-10-CM | POA: Insufficient documentation

## 2021-05-01 DIAGNOSIS — Z95828 Presence of other vascular implants and grafts: Secondary | ICD-10-CM

## 2021-05-01 DIAGNOSIS — C50812 Malignant neoplasm of overlapping sites of left female breast: Secondary | ICD-10-CM | POA: Diagnosis not present

## 2021-05-01 DIAGNOSIS — T451X5A Adverse effect of antineoplastic and immunosuppressive drugs, initial encounter: Secondary | ICD-10-CM | POA: Insufficient documentation

## 2021-05-01 DIAGNOSIS — C771 Secondary and unspecified malignant neoplasm of intrathoracic lymph nodes: Secondary | ICD-10-CM

## 2021-05-01 DIAGNOSIS — Z86718 Personal history of other venous thrombosis and embolism: Secondary | ICD-10-CM | POA: Diagnosis not present

## 2021-05-01 DIAGNOSIS — Z7189 Other specified counseling: Secondary | ICD-10-CM

## 2021-05-01 DIAGNOSIS — C773 Secondary and unspecified malignant neoplasm of axilla and upper limb lymph nodes: Secondary | ICD-10-CM | POA: Insufficient documentation

## 2021-05-01 LAB — CMP (CANCER CENTER ONLY)
ALT: 11 U/L (ref 0–44)
AST: 19 U/L (ref 15–41)
Albumin: 3 g/dL — ABNORMAL LOW (ref 3.5–5.0)
Alkaline Phosphatase: 46 U/L (ref 38–126)
Anion gap: 7 (ref 5–15)
BUN: 37 mg/dL — ABNORMAL HIGH (ref 8–23)
CO2: 28 mmol/L (ref 22–32)
Calcium: 9.4 mg/dL (ref 8.9–10.3)
Chloride: 106 mmol/L (ref 98–111)
Creatinine: 1.22 mg/dL — ABNORMAL HIGH (ref 0.44–1.00)
GFR, Estimated: 50 mL/min — ABNORMAL LOW (ref >60)
Glucose, Bld: 85 mg/dL (ref 70–99)
Potassium: 3.8 mmol/L (ref 3.5–5.1)
Sodium: 141 mmol/L (ref 135–145)
Total Bilirubin: 0.3 mg/dL (ref 0.3–1.2)
Total Protein: 7.5 g/dL (ref 6.5–8.1)

## 2021-05-01 LAB — CBC WITH DIFFERENTIAL (CANCER CENTER ONLY)
Abs Immature Granulocytes: 0.03 10*3/uL (ref 0.00–0.07)
Basophils Absolute: 0 10*3/uL (ref 0.0–0.1)
Basophils Relative: 0 %
Eosinophils Absolute: 0.1 10*3/uL (ref 0.0–0.5)
Eosinophils Relative: 1 %
HCT: 29.8 % — ABNORMAL LOW (ref 36.0–46.0)
Hemoglobin: 9.5 g/dL — ABNORMAL LOW (ref 12.0–15.0)
Immature Granulocytes: 1 %
Lymphocytes Relative: 28 %
Lymphs Abs: 1.5 10*3/uL (ref 0.7–4.0)
MCH: 29.6 pg (ref 26.0–34.0)
MCHC: 31.9 g/dL (ref 30.0–36.0)
MCV: 92.8 fL (ref 80.0–100.0)
Monocytes Absolute: 0.7 10*3/uL (ref 0.1–1.0)
Monocytes Relative: 13 %
Neutro Abs: 3 10*3/uL (ref 1.7–7.7)
Neutrophils Relative %: 57 %
Platelet Count: 164 10*3/uL (ref 150–400)
RBC: 3.21 MIL/uL — ABNORMAL LOW (ref 3.87–5.11)
RDW: 13.5 % (ref 11.5–15.5)
WBC Count: 5.2 10*3/uL (ref 4.0–10.5)
nRBC: 0 % (ref 0.0–0.2)

## 2021-05-01 LAB — KAPPA/LAMBDA LIGHT CHAINS
Kappa free light chain: 59.4 mg/L — ABNORMAL HIGH (ref 3.3–19.4)
Kappa, lambda light chain ratio: 2.71 — ABNORMAL HIGH (ref 0.26–1.65)
Lambda free light chains: 21.9 mg/L (ref 5.7–26.3)

## 2021-05-01 MED ORDER — EPOETIN ALFA-EPBX 40000 UNIT/ML IJ SOLN
INTRAMUSCULAR | Status: AC
  Filled 2021-05-01: qty 1

## 2021-05-01 MED ORDER — SODIUM CHLORIDE 0.9 % IV SOLN
Freq: Once | INTRAVENOUS | Status: AC
  Filled 2021-05-01: qty 250

## 2021-05-01 MED ORDER — EPOETIN ALFA-EPBX 40000 UNIT/ML IJ SOLN
40000.0000 [IU] | Freq: Once | INTRAMUSCULAR | Status: AC
  Administered 2021-05-01: 40000 [IU] via SUBCUTANEOUS

## 2021-05-01 MED ORDER — SODIUM CHLORIDE 0.9% FLUSH
10.0000 mL | INTRAVENOUS | Status: DC | PRN
  Filled 2021-05-01: qty 10

## 2021-05-01 MED ORDER — SODIUM CHLORIDE 0.9% FLUSH
10.0000 mL | Freq: Once | INTRAVENOUS | Status: AC
  Administered 2021-05-01: 10 mL
  Filled 2021-05-01: qty 10

## 2021-05-01 MED ORDER — ACETAMINOPHEN 325 MG PO TABS
ORAL_TABLET | ORAL | Status: AC
  Filled 2021-05-01: qty 2

## 2021-05-01 MED ORDER — TRASTUZUMAB-ANNS CHEMO 150 MG IV SOLR
6.0000 mg/kg | Freq: Once | INTRAVENOUS | Status: AC
  Administered 2021-05-01: 525 mg via INTRAVENOUS
  Filled 2021-05-01: qty 25

## 2021-05-01 MED ORDER — DIPHENHYDRAMINE HCL 25 MG PO CAPS
ORAL_CAPSULE | ORAL | Status: AC
  Filled 2021-05-01: qty 1

## 2021-05-01 MED ORDER — HEPARIN SOD (PORK) LOCK FLUSH 100 UNIT/ML IV SOLN
500.0000 [IU] | Freq: Once | INTRAVENOUS | Status: DC | PRN
  Filled 2021-05-01: qty 5

## 2021-05-01 MED ORDER — DIPHENHYDRAMINE HCL 25 MG PO CAPS
25.0000 mg | ORAL_CAPSULE | Freq: Once | ORAL | Status: AC
  Administered 2021-05-01: 25 mg via ORAL

## 2021-05-01 MED ORDER — ACETAMINOPHEN 325 MG PO TABS
650.0000 mg | ORAL_TABLET | Freq: Once | ORAL | Status: AC
  Administered 2021-05-01: 650 mg via ORAL

## 2021-05-01 NOTE — Progress Notes (Signed)
This RN was notified by treatment room nurse that pt had fall on right side of body. Per port access with saline flush - pt noted " sensation like the saline is going into my skin around the port."  Per MD review- orders for xrays entered. Treatment room nurse informed to discuss plan with pt.

## 2021-05-01 NOTE — Progress Notes (Signed)
Patient states to recently having multiple falls at home.When flushing her port with saline she states "I feel  like the saline is under my skin and it feels cool. Provider and his nurse made aware. Patient to have port evaluated for possible fracture. Okay to give treatment peripherally today. Patient to be scheduled for x-ray after treatment today. She was made aware and states understanding.

## 2021-05-01 NOTE — Patient Instructions (Signed)

## 2021-05-02 ENCOUNTER — Telehealth (HOSPITAL_COMMUNITY): Payer: Self-pay | Admitting: *Deleted

## 2021-05-02 LAB — CANCER ANTIGEN 27.29: CA 27.29: 15.3 U/mL (ref 0.0–38.6)

## 2021-05-02 NOTE — Telephone Encounter (Signed)
PYP pre cert peer to peer never completed.

## 2021-05-03 ENCOUNTER — Telehealth: Payer: Self-pay | Admitting: Rheumatology
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
## 2021-05-04 ENCOUNTER — Other Ambulatory Visit: Payer: Self-pay | Admitting: Internal Medicine

## 2021-05-07 ENCOUNTER — Telehealth: Payer: Self-pay

## 2021-05-07 NOTE — Telephone Encounter (Signed)
Attempted to contact the patient and left message for patient to call the office.  

## 2021-05-07 NOTE — Telephone Encounter (Signed)
Patient called stating she has a follow-up appointment scheduled for 05/13/21 with Dr. Estanislado Pandy.  Patient states Dr. Estanislado Pandy called her with the results from her labwork and was told "there was nothing else to offer her" and that she would contact the neurologist.  Patient requested a return call to let her know if she can cancel the appointment.

## 2021-05-09 NOTE — Telephone Encounter (Signed)
Dr. Estanislado Pandy reviewed the patients lab work with her over the phone.  Patient advised FTA-antibodies were not drawn due to being placed as clinic collect instead of lab collect. Patient advised she can have this drawn by another provider if she would like to.  Dr. Estanislado Pandy recommended following up with her neurologist, Dr. Posey Pronto.  Patient states she has an appointment with Dr. Posey Pronto this coming week and will discuss having the FTA-antibodies drawn.

## 2021-05-12 ENCOUNTER — Encounter: Payer: Self-pay | Admitting: Neurology

## 2021-05-12 ENCOUNTER — Other Ambulatory Visit: Payer: Self-pay

## 2021-05-12 ENCOUNTER — Ambulatory Visit: Payer: 59 | Admitting: Neurology

## 2021-05-12 VITALS — BP 134/74 | HR 68 | Ht 59.0 in | Wt 200.1 lb

## 2021-05-12 DIAGNOSIS — G959 Disease of spinal cord, unspecified: Secondary | ICD-10-CM | POA: Diagnosis not present

## 2021-05-12 DIAGNOSIS — G619 Inflammatory polyneuropathy, unspecified: Secondary | ICD-10-CM | POA: Diagnosis not present

## 2021-05-12 NOTE — Patient Instructions (Addendum)
MRI cervical spine without contrast  We will refer to [place]

## 2021-05-12 NOTE — Progress Notes (Signed)
Follow-up Visit   Date: 05/12/21   [name] MRN: [id number] DOB: 1956/01/24   Interim History: [name] is a 65 y.o. 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  The patient was accompanied to the clinic by self.  History of present illness: She began having right hand tingling in September 2021, which then transitioned into left shoulder and arm pain.  NCS/EMG performed by Dr. Krista Blue at [facility] in October showed bilateral carpal tunnel syndrome.  Around the same time, she began having weakness in the hands and worsening feet tingling/stinging pain. Symptoms continued to progress and by November, she had developed right foot drop.  MRI cervical spine showed severe spinal stenosis at C4-5 with flattening of the cervical cord.  She underwent ACDF at C4-5 in December 2021.  Since this time, she has not developed any new weakness or pain, however, there has not been any improvement in weakness.  She struggles to manipulate her keys, door handles, and any fine motor movements.  Her pain is better controlled on gabapentin 348m three times daily, but still not under optimal control.  She lives alone.   UPDATE 02/21/2021:  At her last visit, I [facility] 20[facility].  These results indicated a much more diffuse sensorimotor polyneuropathy, markedly different from what was seen in October with Dr. YKrista Blue  Due to these findings, I suggested she have spinal testing to evaluate for polyradiculoneuropathy.  LP was essentially normal.  Her serology shows markedly elevated ESR (>100).  Therefore, additional autoimmune labs were checked.  Of these, her PR3 antibody returned positive  (31).  She denies any new symptoms, improvement or worsening.  She has been going to PT/OT.    UPDATE 05/12/2021:  She was evaluated by Dr. DEstanislado Pandywhose evaluation did not find anything to support inflammatory neuropathy. Overall, there has been no significant worsening or improvement of her hand weakness.  Her painful paresthesias are well-controlled on gabapentin 603mTID.  Overall, her balance feels that it is better. She is walking with a cane now. She continues to have severe weakness in the hands.  She suffered a fall two weeks ago and needed to pull up on her bed to stand up.    Medications:  Current Outpatient Medications on File Prior to Visit  Medication Sig Dispense Refill   carvedilol (COREG) 6.25 MG tablet Take 1 tablet (6.25 mg total) by mouth 2 (two) times daily. 180 tablet 3   chlorhexidine (PERIDEX) 0.12 % solution Use as directed 15 mLs in the mouth or throat 2 (two) times daily. 120 mL 0   furosemide (LASIX) 40 MG tablet TAKE 1 TABLET BY MOUTH EVERY DAY 30 tablet 3   gabapentin (NEURONTIN) 300 MG capsule Take 2 capsules (600 mg total) by mouth 3 (three) times daily. 480 capsule 1   lidocaine-prilocaine (EMLA) cream 4 gram tid prn 120 g 5   potassium chloride SA (KLOR-CON M20) 20 MEQ tablet Take 1 tablet (20 mEq total) by mouth daily. 90 tablet 3   pravastatin (PRAVACHOL) 40 MG tablet Take 1 tablet (40 mg total) by mouth daily. 90 tablet 3   rivaroxaban (XARELTO) 20 MG TABS tablet Take 1 tablet (20 mg total) by mouth daily with supper. 90 tablet 3  telmisartan-hydrochlorothiazide (MICARDIS HCT) 80-12.5 MG tablet Take 1 tablet by mouth daily. 90 tablet 3   Continuous Blood Gluc Sensor (FREESTYLE LIBRE 14 DAY SENSOR) MISC INJECT 1 SENSOR TO THE SKIN EVERY 14 DAYS FOR CONTINUOUS GLUCOSE MONITORING. (Patient not taking: Reported on 05/12/2021)     glucose blood (ONE TOUCH ULTRA TEST) test strip 1 each by Other route 2 (two) times daily. Use to check blood sugars twice a day Dx E11.9  (Patient not taking: Reported on 05/12/2021) 100 each 0   Lancets (ONETOUCH ULTRASOFT) lancets 1 each by Other route 2 (two) times daily. Use to check blood sugars twice a day Dx E11.9 (Patient not taking: Reported on 05/12/2021) 100 each 0   omeprazole (PRILOSEC) 40 MG capsule Take 40 mg by mouth daily as needed (Heartburn). (Patient not taking: Reported on 05/12/2021)     oxyCODONE (OXY IR/ROXICODONE) 5 MG immediate release tablet Take 1 tablet (5 mg total) by mouth every 4 (four) hours as needed for severe pain. (Patient not taking: Reported on 05/12/2021) 30 tablet 0   silver sulfADIAZINE (SILVADENE) 1 % cream Apply 1 application topically as needed. (Patient not taking: Reported on 05/12/2021)     Current Facility-Administered Medications on File Prior to Visit  Medication Dose Route Frequency Provider Last Rate Last Admin   cloNIDine (CATAPRES) tablet 0.1 mg  0.1 mg Oral Daily Sandi Mealy E., PA-C   0.1 mg at 11/21/19 1742    Allergies:  Allergies  Allergen Reactions   Morphine And Related Nausea And Vomiting   Codeine Nausea Only   Cymbalta [Duloxetine Hcl] Other (See Comments)    Head felt funny, ? thinking not right   Darvon Nausea Only   Hydrocodone Nausea Only   Hydrocodone-Acetaminophen Nausea Only   Oxycodone Nausea Only   Propoxyphene Hcl Nausea Only   Rosuvastatin Other (See Comments)    Bone pain    Vital Signs:  BP 134/74   Pulse 68   Ht 4' 11"  (1.499 m)   Wt 200 lb 2 oz (90.8 kg)   LMP 05/18/2016   SpO2 97%   BMI 40.42 kg/m   Neurological Exam: MENTAL STATUS including orientation to time, place, person, recent and remote memory, attention span and concentration, language, and fund of knowledge is normal.  Speech is not dysarthric.  CRANIAL NERVES:  No visual field defects.  Pupils equal round and reactive to light.  Normal conjugate, extra-ocular eye movements in all directions of gaze.  No ptosis.    MOTOR:  Severe bilateral hand atrophy with extensor  deformity.  No fasciculations or abnormal movements.  No pronator drift.    Upper Extremity:  Right   Left  Deltoid  5/5    5/5   Biceps  5/5    5/5   Triceps  5/5    5/5   Infraspinatus 5/5   5/5  Medial pectoralis 5/5   5/5  Wrist extensors  5-/5    5-/5   Wrist flexors  5-/5    5-/5   Finger extensors  3/5    3-/5   Finger flexors  4/5    4/5   Dorsal interossei  3/5    3/5   Abductor pollicis  3/5    3/5   Tone (Ashworth scale)  0   0    Lower Extremity:  Right   Left  Hip flexors  5/5    5/5   Hip extensors  5/5    5/5   Adductor  5/5   5/5  Abductor 5/5   5/5  Knee flexors  5/5    5/5   Knee extensors  5/5    5/5   Dorsiflexors  4/5    5/5   Plantarflexors  4/5    5/5   Toe extensors  4/5    5/5   Toe flexors  4/5    5/5   Tone (Ashworth scale)  0   0    MSRs:    Right        Left                  brachioradialis 3+   3+  biceps 3+   3+  triceps 3+   3+  patellar 3+   3+  ankle jerk 2+   2+  Hoffman no   no  plantar response down   down    SENSORY:  Temperature and pin prick reduced in the hands and feet.  Vibration reduced at the right ankle.   COORDINATION/GAIT: Normal finger-to- nose-finger.  Finger tapping is slowed due to hand weakness.  Gait is assisted with a cane, slow, stable.     Data: NCS/EMG of the arms 08/05/2020: This is an abnormal study.  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  There are also electrodiagnostic evidence of moderate left carpal tunnel syndromes.  There was no evidence of active right cervical radiculopathy.   NCS/EMG from [facility] (under media tab) which shows diffuse sensorimotor neuropathy affecting the arms, which is worse then what was seen on her NCS/EMG from October. NCS/EMG dated 10/31/2020 showed absent median, ulnar, and radial sensory responses with prolonged median and ulnar motor responses.    MRI  cervical spine wo contrast 09/09/2020: 1. Prominent right central disc osteophyte complex at C4-5 with resultant severe spinal stenosis and flattening of the cervical spinal cord. No appreciable cord signal changes. 2. Broad posterior disc osteophyte at C5-6 with resultant mild spinal stenosis, with moderate left worse than right C6 foraminal narrowing. 3. Right subarticular to foraminal disc osteophyte complex at C3-4 with resultant mild spinal stenosis, with moderate right C4 foraminal narrowing. 4. 1.5 cm cystic well-circumscribed lesion adjacent to the upper esophagus as above, indeterminate. Unclear whether this is associated with the adjacent esophagus, possibly reflecting an esophageal diverticulum, or possibly an exophytic thyroid nodule. Further evaluation with dedicated thyroid ultrasound recommended. (ref: J Am Coll Radiol. 2015 Feb;12(2): 143-50). 5. No evidence for metastatic disease within the cervical spine.  Labs 01/23/2021:  ESR >100, TSH 1.57, B12 508, folate 8.9, SPEP with IFE no M protein, copper 173 Labs 02/11/2021: ESR 81*, antiPR3 antibody 31*, MPO neg, SSA/B neg, RF neg, cryo neg, CRP <1.0   CSF analysis 01/27/2021:  R0 W7* P33 G53, cytology neg, OCB - identical bands in CSF and serum suggestive of systemic inflammation  IMPRESSION/PLAN: Bilateral hand weakness and atrophy due to overlapping cervical myelopathy s/p ACDF as well as subacute onset of polyneuropathy.  From her history, her neuropathy progressed rapidly and I think initially, it was difficult to determine how much of her symptoms were stemming from cervical myelopathy vs. rapidly progressive neuropathy.   In addition to hand paresthesias, she developed painful right foot drop.  The asymmetric involvement raises concern for possible vasculitic process causing inflammatory neuropathy. There is no evidence of polyradiculoneuropathy on CSF testing.  I have checked a number of labs for etiology, of  which her ESR remains  elevated (most likely due to underlying cancer) and she has positive PR3 antibody. She was evaluated by rheumatology who did not find clear evidence to support inflammatory-mediated process.  She is understandably frustrated at the lack of improvement in her hand strength. I think much of this is residual deficits from cervical myelopathy and neuropathy.  I offered repeat NCS/EMG or MRI cervical spine to evaluate for interval change, and agreeable to the latter.   She does not wish to pursue electrodiagnostic testing again.  I also offered her a second opinion to see if there is any identifiable cause for her neuropathy, which she would like to proceed with.  I will refer her to [place] for their opinion.   Total time spent reviewing records, interview, history/exam, documentation, counseling, and coordination of care on day of encounter:  30 min    Thank you for allowing me to participate in patient's care.  If I can answer any additional questions, I would be pleased to do so.    Sincerely,    Jacalyn Biggs K. Posey Pronto, DO

## 2021-05-13 ENCOUNTER — Telehealth: Payer: Self-pay | Admitting: *Deleted

## 2021-05-13 ENCOUNTER — Ambulatory Visit: Payer: 59 | Admitting: Rheumatology

## 2021-05-13 DIAGNOSIS — I700 Atherosclerosis of aorta: Secondary | ICD-10-CM

## 2021-05-13 DIAGNOSIS — K429 Umbilical hernia without obstruction or gangrene: Secondary | ICD-10-CM

## 2021-05-13 DIAGNOSIS — C771 Secondary and unspecified malignant neoplasm of intrathoracic lymph nodes: Secondary | ICD-10-CM

## 2021-05-13 DIAGNOSIS — C50812 Malignant neoplasm of overlapping sites of left female breast: Secondary | ICD-10-CM

## 2021-05-13 DIAGNOSIS — G619 Inflammatory polyneuropathy, unspecified: Secondary | ICD-10-CM

## 2021-05-13 DIAGNOSIS — G4733 Obstructive sleep apnea (adult) (pediatric): Secondary | ICD-10-CM

## 2021-05-13 DIAGNOSIS — I872 Venous insufficiency (chronic) (peripheral): Secondary | ICD-10-CM

## 2021-05-13 DIAGNOSIS — R700 Elevated erythrocyte sedimentation rate: Secondary | ICD-10-CM

## 2021-05-13 DIAGNOSIS — M5000 Cervical disc disorder with myelopathy, unspecified cervical region: Secondary | ICD-10-CM

## 2021-05-13 DIAGNOSIS — M76821 Posterior tibial tendinitis, right leg: Secondary | ICD-10-CM

## 2021-05-13 DIAGNOSIS — I10 Essential (primary) hypertension: Secondary | ICD-10-CM

## 2021-05-13 DIAGNOSIS — K760 Fatty (change of) liver, not elsewhere classified: Secondary | ICD-10-CM

## 2021-05-13 DIAGNOSIS — E1142 Type 2 diabetes mellitus with diabetic polyneuropathy: Secondary | ICD-10-CM

## 2021-05-13 DIAGNOSIS — G959 Disease of spinal cord, unspecified: Secondary | ICD-10-CM

## 2021-05-13 DIAGNOSIS — G629 Polyneuropathy, unspecified: Secondary | ICD-10-CM

## 2021-05-13 DIAGNOSIS — C541 Malignant neoplasm of endometrium: Secondary | ICD-10-CM

## 2021-05-13 DIAGNOSIS — Z86718 Personal history of other venous thrombosis and embolism: Secondary | ICD-10-CM

## 2021-05-13 DIAGNOSIS — Z95828 Presence of other vascular implants and grafts: Secondary | ICD-10-CM

## 2021-05-13 DIAGNOSIS — Z8719 Personal history of other diseases of the digestive system: Secondary | ICD-10-CM

## 2021-05-13 DIAGNOSIS — R748 Abnormal levels of other serum enzymes: Secondary | ICD-10-CM

## 2021-05-13 DIAGNOSIS — M6701 Short Achilles tendon (acquired), right ankle: Secondary | ICD-10-CM

## 2021-05-13 DIAGNOSIS — Z8639 Personal history of other endocrine, nutritional and metabolic disease: Secondary | ICD-10-CM

## 2021-05-13 DIAGNOSIS — E119 Type 2 diabetes mellitus without complications: Secondary | ICD-10-CM

## 2021-05-13 NOTE — Telephone Encounter (Signed)
Referral sent and faxed to [phone]

## 2021-05-13 NOTE — Telephone Encounter (Signed)
-----   Message from Alda Berthold, DO sent at 05/13/2021 12:10 PM EDT ----- Please refer pt to [facility] - specifically Neuromuscular Medicine for neuropathy.  Thanks.

## 2021-05-15 ENCOUNTER — Telehealth: Payer: Self-pay | Admitting: Neurology

## 2021-05-15 NOTE — Telephone Encounter (Signed)
C[place] from [place] called and said the patient's insurance denied to MRI. They are requesting a peer to peer.  Details below:  [phone] ,[place] Josem Kaufmann is needing a p2p [phone] opt # 3.//csd

## 2021-05-15 NOTE — Telephone Encounter (Signed)
Case ID: [id number]  Peer-to-peer completed. MRI approved.  Authorization # [id number]  Valid until:  06/29/2021

## 2021-05-15 NOTE — Telephone Encounter (Signed)
Called [facility] and notified [place] that MRI was approval. The information below was given.

## 2021-05-16 NOTE — Telephone Encounter (Signed)
Error

## 2021-05-18 ENCOUNTER — Ambulatory Visit
Admission: RE | Admit: 2021-05-18 | Discharge: 2021-05-18 | Disposition: A | Discharge status: Home / Self Care | Payer: 59 | Source: Ambulatory Visit | Attending: Neurology | Admitting: Neurology

## 2021-05-18 DIAGNOSIS — G959 Disease of spinal cord, unspecified: Secondary | ICD-10-CM

## 2021-05-20 ENCOUNTER — Other Ambulatory Visit: Payer: Self-pay

## 2021-05-20 DIAGNOSIS — C50812 Malignant neoplasm of overlapping sites of left female breast: Secondary | ICD-10-CM

## 2021-05-20 DIAGNOSIS — Z171 Estrogen receptor negative status [ER-]: Secondary | ICD-10-CM

## 2021-05-20 NOTE — Progress Notes (Signed)
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 advanced dental or periodontal disease. No evidence of facial trauma. Previous cervical fusion beginning at C4. Orbits: Normal Sinuses: Clear Soft tissues: Soft tissue swelling of the upper lip and base of the nose. No evidence of drainable soft tissue abscess. Limited intracranial: Normal IMPRESSION: Dental and periodontal disease at maxillary teeth 9 and 10. Lytic destruction of the alveolar ridge of the maxilla extending to both the anterior and posterior walls. Findings most consistent with radicular cyst/abscess. Adjacent soft tissue inflammation of the upper lip and inferior nose. Electronically Signed   By: Nelson Chimes M.D.   On: 04/24/2021 13:19     ASSESSMENT: 65 y.o.  [place] woman with  A: INVASIVE DUCTAL CARCINOMA LEFT BREAST (1)  status post left lumpectomy and sentinel lymph node dissection April of 2012 for a T1b N1(mic) stage IB invasive ductal carcinoma, grade 1, estrogen receptor 82% and progesterone receptor 92% positive, with no HER-2 amplification, and an MIB-1-1 of 17%,   (2)  The patient's Oncotype DX score of 21 predicted a 13% risk of distant recurrence after 5 years of  tamoxifen.  (3)  status post radiation completed August of 2012,   (4)  on tamoxifen from September of 2012 to April 2014  (5) the plan had been to initiate anastrozole in April 2014, but the patient had a menstrual cycle in May 2014, and resumed tamoxifen.  (a) discontinued tamoxifen on her own initiative June 2015 because of "aches and pains".  (b) resumed tamoxifen December 2015, discontinued February 2016 at patient's discretion  (6) morbid  obesity: s/p [employer] program; considering bariattric surgery  B: ENDOMETRIAL CANCER (7) S/P laparoscopic hysterectomy with bilateral salpingo-oophorectomy and sentinel lymph node biopsy 06/16/2016 for a pT1a pN0, grade 1 endometrioid carcinoma  (8) status post left breast biopsy 04/28/2019 for a clinically 3.5 cm ductal carcinoma in situ, grade 3, estrogen and progesterone receptor negative  (9) definitive surgery delayed (see discussion in 11/03/2019 note)  (10) genetics testing 06/14/2019 through the Multi-Gene Panel offered by [facility] found no deleterious mutations in AIP, ALK, APC, ATM, AXIN2,BAP1,  BARD1, BLM, BMPR1A, BRCA1, BRCA2, BRIP1, CASR, CDC73, CDH1, CDK4, CDKN1B, CDKN1C, CDKN2A (p14ARF), CDKN2A (p16INK4a), CEBPA, CHEK2, CTNNA1, DICER1, DIS3L2, EGFR (c.2369C>T, p.Thr790Met variant only), EPCAM (Deletion/duplication testing only), FH, FLCN, GATA2, GPC3, GREM1 (Promoter region deletion/duplication testing only), HOXB13 (c.251G>A, p.Gly84Glu), HRAS, KIT, MAX, MEN1, MET, MITF (c.952G>A, p.Glu318Lys variant only), MLH1, MSH2, MSH3, MSH6, MUTYH, NBN, NF1, NF2, NTHL1, PALB2, PDGFRA, PHOX2B, PMS2, POLD1, POLE, POT1, PRKAR1A, PTCH1, PTEN, RAD50, RAD51C, RAD51D, RB1, RECQL4, RET, RNF43, RUNX1, SDHAF2, SDHA (sequence changes only), SDHB, SDHC, SDHD, SMAD4, SMARCA4, SMARCB1, SMARCE1, STK11, SUFU, TERC, TERT, TMEM127, TP53, TSC1, TSC2, VHL, WRN and WT1.    C: METASTATIC BREAST CANCER: JAN 2021 (11) left axillary lymph node biopsy 10/30/2019 documents  invasive mammary carcinoma, grade 3, estrogen receptor positive (80%, weak), progesterone receptor negative, HER-2 amplified (3+) MIB-70%  (a) breast MRI 11/23/2019 shows 3.6 cm non-mass-like enhancement in the left breast, with a second more clumped area measuring 4.8 cm, and at least 5 morphologically abnormal left axillary lymph node.  There is a left subpectoral lymph node and a left internal mammary lymph node noted as well.  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  (b) pertuzumab held with cycle 2 because of persistent diarrhea  (c) anti-HER2 therapy held after cycle 4 because of a drop in EF  (d) carbo/gemzar stopped after cycle 5, last dose 02/13/2020  (13) left breast lumpectomy and axillary node dissection on 04/10/2020 showed a residual ypT0 ypN1    (a) 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  (b) a total of 2 left axillary lymph nodes removed, one positive  (14) anti-HER-2 treatment resumed after surgery to be continued indefinitely  (a) echo 11/09/2019 shows an ejection fraction in the 60-65% range  (b) echo 02/09/2020 shows an ejection fraction in the 45 to 50% range  (c) echo 03/26/2020 shows an ejection fraction in the 55-60% range  (d) trastuzumab resumed 03/28/2020  (e) echo 05/06/2020 shows an ejection fraction in the 55-60% range  (f) trastuzumab discontinued after 06/20/2020 dose with progression  (14) switched to TDM-1/[place]la starting 07/11/2020  (a) new baseline PET scan 07/01/2020 shows significant supraclavicular, mediastinal and prevascular  adenopathy with new small left pleural and pericardial effusions  (b) echo 06/28/2020 shows and ejection fraction in the 55-60% range  (c) PET scan on 08/15/2020 shows interval response to chemotherapy with decrease in size and SUV of lymphadenopathy, no new or progressive disease identified in abdomen, pelvis, or bones  (d) switched back to Herceptin/trastuzumab  as of 10/24/2020 secondary to patient's concerns regarding possible neuropathy  (e) repeat echocardiography 11/21/2020 shows an ejection fraction in  the 55% range  (f) echocardiogram 02/17/2021 shows an ejection fraction in the 50-55% range  (g) brain MRI 11/28/2020 shows no evidence of metastatic disease  (h) PET scan 02/17/2021 shows significant response in the mediastinal adenopathy and left lateral breast mass; no lung, liver or bone lesions  (14) did not receive adjuvant radiation   (16) left lower extremity DVT documented by Doppler ultrasound 05/31/2020  (a) rivaroxaban/Xarelto started 05/31/2020  (17) osteoarthritis/ degenerative disease  (a) cervical spine MRI 09/08/2020 showed a herniated nucleus pulposus at cervical 4/5, no evidence of metastatic disease within the cervical spine  (b) lumbar MRI 09/27/2020 showed significant degenerative disease, no evidence of metastasis  (c) status post anterior cervical decompression C4/5 with arthrodesis 10/02/2020 ([place])  (18) bilateral carpal tunnel syndrome documented by electromyography 08/05/2020, severe on the right, moderate on the left Krista Blue)   PLAN: Chikita continues to have multiple comorbidities but I am not sure any of them are directly related to her cancer or its treatment.  We really do need to restage her.  We agree that she would have a repeat PET scan before she returns to see me in 6 weeks.  We are continuing the trastuzumab every 21 days.  She also receives Retacrit today and every 3 weeks so long as her hemoglobin is less than 10.  Dr Aundra Dubin suggested w/u for  cardiac amyloidosis. He has ordered PYP scans which have not been scheduled. I have re-entered the order today. Note Amori had no monoclonal protein or significant kappa/lambda findings in May. I am repeating those labs today.  She has some cervical stenosis.  She is going to be following with neurosurgery for that.  The abscess in the roof of her mouth is gone to be evaluated next week by Dr. Radene Journey.  I suggested that she consider water aerobics as a form of exercise.  She will explore that as well  Total encounter time 35 minutes.Sarajane Jews C. Leland Staszewski, MD 05/21/21 9:28 AM Medical Oncology and Hematology [facility] [place], [place] [address] Tel. [phone]    Fax. [phone]   I, Wilburn Mylar, am acting as scribe for Dr. [name]. Amaka Gluth.  I, Lurline Del MD, have reviewed the above documentation for accuracy and completeness, and I agree with the above.   *Total Encounter Time as defined by the Centers for Medicare and Medicaid Services includes, in addition to the face-to-face time of a patient visit (documented in the note above) non-face-to-face time: obtaining and reviewing outside history, ordering and reviewing medications, tests or procedures, care coordination (communications with other health care professionals or caregivers) and documentation in the medical record.

## 2021-05-21 ENCOUNTER — Inpatient Hospital Stay: Payer: 59 | Admitting: Oncology

## 2021-05-21 ENCOUNTER — Other Ambulatory Visit: Payer: 59

## 2021-05-21 ENCOUNTER — Inpatient Hospital Stay: Payer: 59

## 2021-05-21 ENCOUNTER — Other Ambulatory Visit: Payer: Self-pay

## 2021-05-21 ENCOUNTER — Inpatient Hospital Stay: Payer: 59 | Attending: Oncology

## 2021-05-21 VITALS — BP 145/91 | HR 67 | Temp 97.7°F | Resp 18 | Ht 59.0 in | Wt 208.5 lb

## 2021-05-21 DIAGNOSIS — Z171 Estrogen receptor negative status [ER-]: Secondary | ICD-10-CM

## 2021-05-21 DIAGNOSIS — C50912 Malignant neoplasm of unspecified site of left female breast: Secondary | ICD-10-CM | POA: Insufficient documentation

## 2021-05-21 DIAGNOSIS — Z7901 Long term (current) use of anticoagulants: Secondary | ICD-10-CM | POA: Insufficient documentation

## 2021-05-21 DIAGNOSIS — Z5112 Encounter for antineoplastic immunotherapy: Secondary | ICD-10-CM | POA: Diagnosis not present

## 2021-05-21 DIAGNOSIS — E119 Type 2 diabetes mellitus without complications: Secondary | ICD-10-CM | POA: Diagnosis not present

## 2021-05-21 DIAGNOSIS — Z8542 Personal history of malignant neoplasm of other parts of uterus: Secondary | ICD-10-CM | POA: Diagnosis not present

## 2021-05-21 DIAGNOSIS — Z9079 Acquired absence of other genital organ(s): Secondary | ICD-10-CM | POA: Diagnosis not present

## 2021-05-21 DIAGNOSIS — Z90710 Acquired absence of both cervix and uterus: Secondary | ICD-10-CM | POA: Diagnosis not present

## 2021-05-21 DIAGNOSIS — Z90722 Acquired absence of ovaries, bilateral: Secondary | ICD-10-CM | POA: Diagnosis not present

## 2021-05-21 DIAGNOSIS — Z853 Personal history of malignant neoplasm of breast: Secondary | ICD-10-CM | POA: Insufficient documentation

## 2021-05-21 DIAGNOSIS — C773 Secondary and unspecified malignant neoplasm of axilla and upper limb lymph nodes: Secondary | ICD-10-CM | POA: Insufficient documentation

## 2021-05-21 DIAGNOSIS — Z86718 Personal history of other venous thrombosis and embolism: Secondary | ICD-10-CM | POA: Diagnosis not present

## 2021-05-21 DIAGNOSIS — Z95828 Presence of other vascular implants and grafts: Secondary | ICD-10-CM

## 2021-05-21 DIAGNOSIS — Z79899 Other long term (current) drug therapy: Secondary | ICD-10-CM | POA: Diagnosis not present

## 2021-05-21 DIAGNOSIS — C50112 Malignant neoplasm of central portion of left female breast: Secondary | ICD-10-CM

## 2021-05-21 DIAGNOSIS — C50812 Malignant neoplasm of overlapping sites of left female breast: Secondary | ICD-10-CM

## 2021-05-21 DIAGNOSIS — I10 Essential (primary) hypertension: Secondary | ICD-10-CM | POA: Diagnosis not present

## 2021-05-21 LAB — CMP (CANCER CENTER ONLY)
ALT: 17 U/L (ref 0–44)
AST: 18 U/L (ref 15–41)
Albumin: 3.1 g/dL — ABNORMAL LOW (ref 3.5–5.0)
Alkaline Phosphatase: 44 U/L (ref 38–126)
Anion gap: 8 (ref 5–15)
BUN: 26 mg/dL — ABNORMAL HIGH (ref 8–23)
CO2: 23 mmol/L (ref 22–32)
Calcium: 9.3 mg/dL (ref 8.9–10.3)
Chloride: 112 mmol/L — ABNORMAL HIGH (ref 98–111)
Creatinine: 0.94 mg/dL (ref 0.44–1.00)
GFR, Estimated: >60 mL/min (ref >60)
Glucose, Bld: 92 mg/dL (ref 70–99)
Potassium: 4 mmol/L (ref 3.5–5.1)
Sodium: 143 mmol/L (ref 135–145)
Total Bilirubin: 0.3 mg/dL (ref 0.3–1.2)
Total Protein: 7.2 g/dL (ref 6.5–8.1)

## 2021-05-21 LAB — CBC WITH DIFFERENTIAL (CANCER CENTER ONLY)
Abs Immature Granulocytes: 0.01 10*3/uL (ref 0.00–0.07)
Basophils Absolute: 0 10*3/uL (ref 0.0–0.1)
Basophils Relative: 0 %
Eosinophils Absolute: 0.1 10*3/uL (ref 0.0–0.5)
Eosinophils Relative: 2 %
HCT: 31 % — ABNORMAL LOW (ref 36.0–46.0)
Hemoglobin: 9.8 g/dL — ABNORMAL LOW (ref 12.0–15.0)
Immature Granulocytes: 0 %
Lymphocytes Relative: 28 %
Lymphs Abs: 1.5 10*3/uL (ref 0.7–4.0)
MCH: 29.3 pg (ref 26.0–34.0)
MCHC: 31.6 g/dL (ref 30.0–36.0)
MCV: 92.5 fL (ref 80.0–100.0)
Monocytes Absolute: 0.6 10*3/uL (ref 0.1–1.0)
Monocytes Relative: 11 %
Neutro Abs: 3.2 10*3/uL (ref 1.7–7.7)
Neutrophils Relative %: 59 %
Platelet Count: 191 10*3/uL (ref 150–400)
RBC: 3.35 MIL/uL — ABNORMAL LOW (ref 3.87–5.11)
RDW: 14.4 % (ref 11.5–15.5)
WBC Count: 5.5 10*3/uL (ref 4.0–10.5)
nRBC: 0 % (ref 0.0–0.2)

## 2021-05-21 MED ORDER — SODIUM CHLORIDE 0.9 % IV SOLN
Freq: Once | INTRAVENOUS | Status: AC
Start: 2021-05-21 — End: 2021-05-21
  Filled 2021-05-21: qty 250

## 2021-05-21 MED ORDER — ACETAMINOPHEN 325 MG PO TABS
650.0000 mg | ORAL_TABLET | Freq: Once | ORAL | Status: AC
Start: 2021-05-21 — End: 2021-05-21
  Administered 2021-05-21: 650 mg via ORAL

## 2021-05-21 MED ORDER — ACETAMINOPHEN 325 MG PO TABS
ORAL_TABLET | ORAL | Status: AC
  Filled 2021-05-21: qty 2

## 2021-05-21 MED ORDER — DIPHENHYDRAMINE HCL 25 MG PO CAPS
25.0000 mg | ORAL_CAPSULE | Freq: Once | ORAL | Status: AC
  Administered 2021-05-21: 25 mg via ORAL

## 2021-05-21 MED ORDER — DIPHENHYDRAMINE HCL 25 MG PO CAPS
ORAL_CAPSULE | ORAL | Status: AC
  Filled 2021-05-21: qty 1

## 2021-05-21 MED ORDER — EPOETIN ALFA-EPBX 40000 UNIT/ML IJ SOLN
40000.0000 [IU] | Freq: Once | INTRAMUSCULAR | Status: AC
  Administered 2021-05-21: 40000 [IU] via SUBCUTANEOUS

## 2021-05-21 MED ORDER — TRASTUZUMAB-ANNS CHEMO 150 MG IV SOLR
6.0000 mg/kg | Freq: Once | INTRAVENOUS | Status: AC
  Administered 2021-05-21: 525 mg via INTRAVENOUS
  Filled 2021-05-21: qty 25

## 2021-05-21 MED ORDER — EPOETIN ALFA-EPBX 40000 UNIT/ML IJ SOLN
INTRAMUSCULAR | Status: AC
  Filled 2021-05-21: qty 1

## 2021-05-21 MED ORDER — HEPARIN SOD (PORK) LOCK FLUSH 100 UNIT/ML IV SOLN
500.0000 [IU] | Freq: Once | INTRAVENOUS | Status: AC | PRN
  Administered 2021-05-21: 500 [IU]
  Filled 2021-05-21: qty 5

## 2021-05-21 MED ORDER — SODIUM CHLORIDE 0.9% FLUSH
10.0000 mL | INTRAVENOUS | Status: DC | PRN
  Administered 2021-05-21: 10 mL
  Filled 2021-05-21: qty 10

## 2021-05-21 NOTE — Patient Instructions (Signed)
[facility]  Discharge Instructions: Thank you for choosing [facility] to provide your oncology and hematology care.   If you have a lab appointment with the Cancer Center, please go directly to the Cancer Center and check in at the registration area.   Wear comfortable clothing and clothing appropriate for easy access to any Portacath or PICC line.   We strive to give you quality time with your provider. You may need to reschedule your appointment if you arrive late (15 or more minutes).  Arriving late affects you and other patients whose appointments are after yours.  Also, if you miss three or more appointments without notifying the office, you may be dismissed from the clinic at the provider's discretion.      For prescription refill requests, have your pharmacy contact our office and allow 72 hours for refills to be completed.    Today you received the following chemotherapy and/or immunotherapy agents : Herceptin     To help prevent nausea and vomiting after your treatment, we encourage you to take your nausea medication as directed.  BELOW ARE SYMPTOMS THAT SHOULD BE REPORTED IMMEDIATELY: *FEVER GREATER THAN 100.4 F (38 C) OR HIGHER *CHILLS OR SWEATING *NAUSEA AND VOMITING THAT IS NOT CONTROLLED WITH YOUR NAUSEA MEDICATION *UNUSUAL SHORTNESS OF BREATH *UNUSUAL BRUISING OR BLEEDING *URINARY PROBLEMS (pain or burning when urinating, or frequent urination) *BOWEL PROBLEMS (unusual diarrhea, constipation, pain near the anus) TENDERNESS IN MOUTH AND THROAT WITH OR WITHOUT PRESENCE OF ULCERS (sore throat, sores in mouth, or a toothache) UNUSUAL RASH, SWELLING OR PAIN  UNUSUAL VAGINAL DISCHARGE OR ITCHING   Items with * indicate a potential emergency and should be followed up as soon as possible or go to the Emergency Department if any problems should occur.  Please show the CHEMOTHERAPY ALERT CARD or IMMUNOTHERAPY ALERT CARD at check-in to  the Emergency Department and triage nurse.  Should you have questions after your visit or need to cancel or reschedule your appointment, please contact [facility]  Dept: [phone]  and follow the prompts.  Office hours are 8:00 a.m. to 4:30 p.m. Monday - Friday. Please note that voicemails left after 4:00 p.m. may not be returned until the following business day.  We are closed weekends and major holidays. You have access to a nurse at all times for urgent questions. Please call the main number to the clinic Dept: [phone] and follow the prompts.   For any non-urgent questions, you may also contact your provider using MyChart. We now offer e-Visits for anyone 18 and older to request care online for non-urgent symptoms. For details visit [email or web address].   Also download the MyChart app! Go to the app store, search "MyChart", open the app, select [facility], and log in with your MyChart username and password.  Due to Covid, a mask is required upon entering the hospital/clinic. If you do not have a mask, one will be given to you upon arrival. For doctor visits, patients may have 1 support person aged 18 or older with them. For treatment visits, patients cannot have anyone with them due to current Covid guidelines and our immunocompromised population.   

## 2021-05-23 ENCOUNTER — Telehealth: Payer: Self-pay | Admitting: Internal Medicine

## 2021-05-23 DIAGNOSIS — J029 Acute pharyngitis, unspecified: Secondary | ICD-10-CM

## 2021-05-23 NOTE — Telephone Encounter (Signed)
Yes, ok to cont peridex if that is what she was given

## 2021-05-23 NOTE — Telephone Encounter (Signed)
   Patient seeking advice for mouth abscess. She has an appt on 8/10 with ENT/ Dr Lucia Gaskins  However she would like advice prior to appointment on how to care for mouth in the meantime. She is using a rinse that was given in ED, should she continue rinse?   Please call

## 2021-05-23 NOTE — Telephone Encounter (Signed)
Patient has been notified

## 2021-05-26 MED ORDER — AMOXICILLIN 500 MG PO CAPS: 1000.0000 mg | ORAL_CAPSULE | Freq: Two times a day (BID) | ORAL | 0 refills | Status: AC

## 2021-05-26 NOTE — Telephone Encounter (Signed)
[place] for amoxil course  ENT referral done

## 2021-05-26 NOTE — Telephone Encounter (Signed)
Patient calling to report Peridex is not working, requesting antibiotic  She is also requesting NEW referral to another ENT, she states Dr Lucia Gaskins will be closing the practice soon

## 2021-05-26 NOTE — Addendum Note (Signed)
Addended by: Biagio Borg on: 05/26/2021 01:27 PM   Modules accepted: Orders

## 2021-05-28 ENCOUNTER — Telehealth: Payer: Self-pay | Admitting: Internal Medicine

## 2021-05-28 ENCOUNTER — Ambulatory Visit (INDEPENDENT_AMBULATORY_CARE_PROVIDER_SITE_OTHER): Payer: 59 | Admitting: Otolaryngology

## 2021-05-28 ENCOUNTER — Telehealth: Payer: Self-pay | Admitting: *Deleted

## 2021-05-28 ENCOUNTER — Other Ambulatory Visit: Payer: Self-pay

## 2021-05-28 DIAGNOSIS — K047 Periapical abscess without sinus: Secondary | ICD-10-CM | POA: Diagnosis not present
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
## 2021-05-28 NOTE — Telephone Encounter (Signed)
Patient calling in about referral given by provider to ENT  Patient went to ENT who then referred her to an oral surgeon  Upset about being billed $50 to see ENT when it was not needed  Req callback [phone]

## 2021-05-28 NOTE — Telephone Encounter (Signed)
Pt left VM on nurse line stating concern per ongoing jaw issue that was evaluated by her primary MD who referred her to an ENT ( Dr Radene Journey ).  She went today and was informed she needed to see an oral surgeon not an ENT and Dr Lucia Gaskins mentioned " the dentist at the [facility] " .  She is hoping we can assist with the above.  Noted per chart review - pt had a CTearly July 2022 ( results in [facility]) per above issue,ordered by her primary MD showing :  FINDINGS: Osseous: Lytic destruction of the alveolar ridge of the maxilla surrounding the roots of teeth 9 and 10 most consistent with radicular cyst and osteomyelitis. There is anterior and posterior cortical breakthrough. This is quite likely the cause the soft tissue inflammation. Elsewhere, there is no advanced dental or periodontal disease. No evidence of facial trauma. Previous cervical fusion beginning at C4.  This note with referral will be sent to the Dental Medicine office of Dr Benson Norway. This RN also called the dental office and left message regarding above request.

## 2021-05-29 NOTE — Telephone Encounter (Signed)
Very sorry, but that was my best judgement at the time, I dont have anything else to offer

## 2021-06-02 NOTE — Telephone Encounter (Signed)
Pt notified that PCP was apologetic & felt that he was covering his bases.  Pt states she has made an appt at the cancer center to see an oral surgeon.  Pt expresses her frustration of this not being done 1st vs going to ENT.

## 2021-06-09 ENCOUNTER — Telehealth: Payer: Self-pay | Admitting: Neurology

## 2021-06-09 DIAGNOSIS — G959 Disease of spinal cord, unspecified: Secondary | ICD-10-CM

## 2021-06-09 DIAGNOSIS — M4802 Spinal stenosis, cervical region: Secondary | ICD-10-CM

## 2021-06-09 NOTE — Telephone Encounter (Signed)
Pt called, she would like to be seen in person to discuss her MRI and what she would like done, but patel's next appt is in nov. She stated she would just take a call back to discuss it. She would like to see about a Chief of Staff

## 2021-06-10 NOTE — Telephone Encounter (Signed)
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   Spine surgeon would be able to review this with her better and discuss her options.  She did not want to see Dr. Christella Noa again, who performed her surgery.  There is only one neurosurgery group in [place].  Does she want to see if another surgeon in their group will see her?  Otherwise, we can refer to Dr. Melina Schools at [facility] who is orthopeadic spine surgeon.    If she has additional questions for me, best to set up a follow-up visit - OK to schedule telephone visit.

## 2021-06-11 ENCOUNTER — Inpatient Hospital Stay: Payer: 59

## 2021-06-11 ENCOUNTER — Other Ambulatory Visit: Payer: Self-pay

## 2021-06-11 ENCOUNTER — Ambulatory Visit: Payer: 59 | Admitting: Oncology

## 2021-06-11 VITALS — BP 143/85 | HR 58 | Temp 98.6°F | Resp 16 | Wt 209.0 lb

## 2021-06-11 DIAGNOSIS — C50812 Malignant neoplasm of overlapping sites of left female breast: Secondary | ICD-10-CM

## 2021-06-11 DIAGNOSIS — C50112 Malignant neoplasm of central portion of left female breast: Secondary | ICD-10-CM

## 2021-06-11 DIAGNOSIS — C771 Secondary and unspecified malignant neoplasm of intrathoracic lymph nodes: Secondary | ICD-10-CM

## 2021-06-11 DIAGNOSIS — Z171 Estrogen receptor negative status [ER-]: Secondary | ICD-10-CM

## 2021-06-11 DIAGNOSIS — Z95828 Presence of other vascular implants and grafts: Secondary | ICD-10-CM

## 2021-06-11 DIAGNOSIS — Z5112 Encounter for antineoplastic immunotherapy: Secondary | ICD-10-CM | POA: Diagnosis not present

## 2021-06-11 LAB — CBC WITH DIFFERENTIAL (CANCER CENTER ONLY)
Abs Immature Granulocytes: 0.01 10*3/uL (ref 0.00–0.07)
Basophils Absolute: 0 10*3/uL (ref 0.0–0.1)
Basophils Relative: 1 %
Eosinophils Absolute: 0.1 10*3/uL (ref 0.0–0.5)
Eosinophils Relative: 2 %
HCT: 32.2 % — ABNORMAL LOW (ref 36.0–46.0)
Hemoglobin: 10.2 g/dL — ABNORMAL LOW (ref 12.0–15.0)
Immature Granulocytes: 0 %
Lymphocytes Relative: 27 %
Lymphs Abs: 1.1 10*3/uL (ref 0.7–4.0)
MCH: 29.1 pg (ref 26.0–34.0)
MCHC: 31.7 g/dL (ref 30.0–36.0)
MCV: 91.7 fL (ref 80.0–100.0)
Monocytes Absolute: 0.5 10*3/uL (ref 0.1–1.0)
Monocytes Relative: 11 %
Neutro Abs: 2.4 10*3/uL (ref 1.7–7.7)
Neutrophils Relative %: 59 %
Platelet Count: 154 10*3/uL (ref 150–400)
RBC: 3.51 MIL/uL — ABNORMAL LOW (ref 3.87–5.11)
RDW: 14.3 % (ref 11.5–15.5)
WBC Count: 4.1 10*3/uL (ref 4.0–10.5)
nRBC: 0 % (ref 0.0–0.2)

## 2021-06-11 LAB — CMP (CANCER CENTER ONLY)
ALT: 13 U/L (ref 0–44)
AST: 19 U/L (ref 15–41)
Albumin: 3.2 g/dL — ABNORMAL LOW (ref 3.5–5.0)
Alkaline Phosphatase: 45 U/L (ref 38–126)
Anion gap: 8 (ref 5–15)
BUN: 25 mg/dL — ABNORMAL HIGH (ref 8–23)
CO2: 26 mmol/L (ref 22–32)
Calcium: 9.7 mg/dL (ref 8.9–10.3)
Chloride: 107 mmol/L (ref 98–111)
Creatinine: 0.94 mg/dL (ref 0.44–1.00)
GFR, Estimated: >60 mL/min (ref >60)
Glucose, Bld: 104 mg/dL — ABNORMAL HIGH (ref 70–99)
Potassium: 3.8 mmol/L (ref 3.5–5.1)
Sodium: 141 mmol/L (ref 135–145)
Total Bilirubin: 0.5 mg/dL (ref 0.3–1.2)
Total Protein: 7.7 g/dL (ref 6.5–8.1)

## 2021-06-11 MED ORDER — HEPARIN SOD (PORK) LOCK FLUSH 100 UNIT/ML IV SOLN
500.0000 [IU] | Freq: Once | INTRAVENOUS | Status: AC | PRN
  Administered 2021-06-11: 500 [IU]

## 2021-06-11 MED ORDER — DIPHENHYDRAMINE HCL 25 MG PO CAPS
25.0000 mg | ORAL_CAPSULE | Freq: Once | ORAL | Status: AC
  Administered 2021-06-11: 25 mg via ORAL
  Filled 2021-06-11: qty 1

## 2021-06-11 MED ORDER — SODIUM CHLORIDE 0.9% FLUSH
10.0000 mL | INTRAVENOUS | Status: DC | PRN
  Administered 2021-06-11: 10 mL

## 2021-06-11 MED ORDER — ACETAMINOPHEN 325 MG PO TABS
650.0000 mg | ORAL_TABLET | Freq: Once | ORAL | Status: AC
  Administered 2021-06-11: 650 mg via ORAL
  Filled 2021-06-11: qty 2

## 2021-06-11 MED ORDER — SODIUM CHLORIDE 0.9% FLUSH
10.0000 mL | Freq: Once | INTRAVENOUS | Status: AC
Start: 2021-06-11 — End: 2021-06-11
  Administered 2021-06-11: 10 mL via INTRAVENOUS

## 2021-06-11 MED ORDER — TRASTUZUMAB-ANNS CHEMO 150 MG IV SOLR
6.0000 mg/kg | Freq: Once | INTRAVENOUS | Status: AC
  Administered 2021-06-11: 525 mg via INTRAVENOUS
  Filled 2021-06-11: qty 25

## 2021-06-11 MED ORDER — SODIUM CHLORIDE 0.9 % IV SOLN: Freq: Once | INTRAVENOUS | Status: AC

## 2021-06-11 NOTE — Patient Instructions (Signed)
[facility]  Discharge Instructions: Thank you for choosing [facility] to provide your oncology and hematology care.   If you have a lab appointment with the Cancer Center, please go directly to the Cancer Center and check in at the registration area.   Wear comfortable clothing and clothing appropriate for easy access to any Portacath or PICC line.   We strive to give you quality time with your provider. You may need to reschedule your appointment if you arrive late (15 or more minutes).  Arriving late affects you and other patients whose appointments are after yours.  Also, if you miss three or more appointments without notifying the office, you may be dismissed from the clinic at the provider's discretion.      For prescription refill requests, have your pharmacy contact our office and allow 72 hours for refills to be completed.    Today you received the following chemotherapy and/or immunotherapy agents trastuzumab      To help prevent nausea and vomiting after your treatment, we encourage you to take your nausea medication as directed.  BELOW ARE SYMPTOMS THAT SHOULD BE REPORTED IMMEDIATELY: *FEVER GREATER THAN 100.4 F (38 C) OR HIGHER *CHILLS OR SWEATING *NAUSEA AND VOMITING THAT IS NOT CONTROLLED WITH YOUR NAUSEA MEDICATION *UNUSUAL SHORTNESS OF BREATH *UNUSUAL BRUISING OR BLEEDING *URINARY PROBLEMS (pain or burning when urinating, or frequent urination) *BOWEL PROBLEMS (unusual diarrhea, constipation, pain near the anus) TENDERNESS IN MOUTH AND THROAT WITH OR WITHOUT PRESENCE OF ULCERS (sore throat, sores in mouth, or a toothache) UNUSUAL RASH, SWELLING OR PAIN  UNUSUAL VAGINAL DISCHARGE OR ITCHING   Items with * indicate a potential emergency and should be followed up as soon as possible or go to the Emergency Department if any problems should occur.  Please show the CHEMOTHERAPY ALERT CARD or IMMUNOTHERAPY ALERT CARD at check-in to  the Emergency Department and triage nurse.  Should you have questions after your visit or need to cancel or reschedule your appointment, please contact [facility]  Dept: [phone]  and follow the prompts.  Office hours are 8:00 a.m. to 4:30 p.m. Monday - Friday. Please note that voicemails left after 4:00 p.m. may not be returned until the following business day.  We are closed weekends and major holidays. You have access to a nurse at all times for urgent questions. Please call the main number to the clinic Dept: [phone] and follow the prompts.   For any non-urgent questions, you may also contact your provider using MyChart. We now offer e-Visits for anyone 18 and older to request care online for non-urgent symptoms. For details visit [email or web address].   Also download the MyChart app! Go to the app store, search "MyChart", open the app, select [facility], and log in with your MyChart username and password.  Due to Covid, a mask is required upon entering the hospital/clinic. If you do not have a mask, one will be given to you upon arrival. For doctor visits, patients may have 1 support person aged 18 or older with them. For treatment visits, patients cannot have anyone with them due to current Covid guidelines and our immunocompromised population.   

## 2021-06-11 NOTE — Telephone Encounter (Signed)
Called patient and informed her of MRI results. 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   Asked patient that since she did not want to see Dr. Christella Noa again would she like to be referred   Dr. Melina Schools at [facility] who is orthopeadic spine surgeon. Patient stated that she does not mind having a referral sent to Dr. Rolena Infante. Informed patient that I will go ahead and send a referral over for her. Informed patient that If she has additional questions for Dr. Posey Pronto, best to set up a follow-up visit - OK to schedule telephone visit. Patient verbalized understanding and had no additional questions or concerns.

## 2021-06-12 LAB — KAPPA/LAMBDA LIGHT CHAINS
Kappa free light chain: 58.8 mg/L — ABNORMAL HIGH (ref 3.3–19.4)
Kappa, lambda light chain ratio: 2.91 — ABNORMAL HIGH (ref 0.26–1.65)
Lambda free light chains: 20.2 mg/L (ref 5.7–26.3)

## 2021-06-13 LAB — PROTEIN ELECTROPHORESIS, SERUM, WITH REFLEX
A/G Ratio: 0.9 (ref 0.7–1.7)
Albumin ELP: 3.3 g/dL (ref 2.9–4.4)
Alpha-1-Globulin: 0.2 g/dL (ref 0.0–0.4)
Alpha-2-Globulin: 0.6 g/dL (ref 0.4–1.0)
Beta Globulin: 1.1 g/dL (ref 0.7–1.3)
Gamma Globulin: 1.8 g/dL (ref 0.4–1.8)
Globulin, Total: 3.6 g/dL (ref 2.2–3.9)
Total Protein ELP: 6.9 g/dL (ref 6.0–8.5)

## 2021-06-25 ENCOUNTER — Telehealth: Payer: Self-pay

## 2021-06-25 NOTE — Telephone Encounter (Signed)
New message    Office notes and referral sent to Dr. Rubye Oaks  Fax # [phone]

## 2021-06-26 ENCOUNTER — Other Ambulatory Visit: Payer: Self-pay

## 2021-06-26 ENCOUNTER — Encounter: Payer: Self-pay | Admitting: Oncology

## 2021-06-26 ENCOUNTER — Ambulatory Visit (HOSPITAL_COMMUNITY)
Admission: RE | Admit: 2021-06-26 | Discharge: 2021-06-26 | Disposition: A | Discharge status: Home / Self Care | Payer: 59 | Source: Ambulatory Visit | Attending: Oncology | Admitting: Oncology

## 2021-06-26 ENCOUNTER — Other Ambulatory Visit (HOSPITAL_COMMUNITY): Payer: Self-pay | Admitting: Diagnostic Radiology

## 2021-06-26 DIAGNOSIS — Z171 Estrogen receptor negative status [ER-]: Secondary | ICD-10-CM | POA: Insufficient documentation

## 2021-06-26 DIAGNOSIS — C50112 Malignant neoplasm of central portion of left female breast: Secondary | ICD-10-CM | POA: Insufficient documentation

## 2021-06-26 LAB — GLUCOSE, CAPILLARY: Glucose-Capillary: 125 mg/dL — ABNORMAL HIGH (ref 70–99)

## 2021-06-26 MED ORDER — FLUDEOXYGLUCOSE F - 18 (FDG) INJECTION
10.8000 | Freq: Once | INTRAVENOUS | Status: AC
  Administered 2021-06-26: 10.34 via INTRAVENOUS

## 2021-07-01 NOTE — Progress Notes (Signed)
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 and reviewing medications, tests or procedures, care coordination (communications with other health care professionals or caregivers) and documentation in the medical record.

## 2021-07-02 ENCOUNTER — Inpatient Hospital Stay: Payer: 59

## 2021-07-02 ENCOUNTER — Other Ambulatory Visit: Payer: Self-pay

## 2021-07-02 ENCOUNTER — Inpatient Hospital Stay: Payer: 59 | Admitting: Oncology

## 2021-07-02 ENCOUNTER — Inpatient Hospital Stay: Payer: 59 | Attending: Oncology

## 2021-07-02 VITALS — BP 127/63 | HR 61 | Temp 97.7°F | Resp 18 | Ht 59.0 in | Wt 212.5 lb

## 2021-07-02 DIAGNOSIS — Z171 Estrogen receptor negative status [ER-]: Secondary | ICD-10-CM | POA: Diagnosis not present

## 2021-07-02 DIAGNOSIS — C771 Secondary and unspecified malignant neoplasm of intrathoracic lymph nodes: Secondary | ICD-10-CM

## 2021-07-02 DIAGNOSIS — Z86718 Personal history of other venous thrombosis and embolism: Secondary | ICD-10-CM | POA: Insufficient documentation

## 2021-07-02 DIAGNOSIS — Z5112 Encounter for antineoplastic immunotherapy: Secondary | ICD-10-CM | POA: Diagnosis present

## 2021-07-02 DIAGNOSIS — C50812 Malignant neoplasm of overlapping sites of left female breast: Secondary | ICD-10-CM | POA: Diagnosis not present

## 2021-07-02 DIAGNOSIS — Z7901 Long term (current) use of anticoagulants: Secondary | ICD-10-CM | POA: Insufficient documentation

## 2021-07-02 DIAGNOSIS — C50912 Malignant neoplasm of unspecified site of left female breast: Secondary | ICD-10-CM | POA: Insufficient documentation

## 2021-07-02 DIAGNOSIS — Z923 Personal history of irradiation: Secondary | ICD-10-CM | POA: Diagnosis not present

## 2021-07-02 DIAGNOSIS — Z8542 Personal history of malignant neoplasm of other parts of uterus: Secondary | ICD-10-CM | POA: Insufficient documentation

## 2021-07-02 DIAGNOSIS — Z79899 Other long term (current) drug therapy: Secondary | ICD-10-CM | POA: Diagnosis not present

## 2021-07-02 DIAGNOSIS — C50112 Malignant neoplasm of central portion of left female breast: Secondary | ICD-10-CM

## 2021-07-02 DIAGNOSIS — Z9079 Acquired absence of other genital organ(s): Secondary | ICD-10-CM | POA: Insufficient documentation

## 2021-07-02 DIAGNOSIS — Z9221 Personal history of antineoplastic chemotherapy: Secondary | ICD-10-CM | POA: Diagnosis not present

## 2021-07-02 DIAGNOSIS — C773 Secondary and unspecified malignant neoplasm of axilla and upper limb lymph nodes: Secondary | ICD-10-CM | POA: Insufficient documentation

## 2021-07-02 DIAGNOSIS — Z90710 Acquired absence of both cervix and uterus: Secondary | ICD-10-CM | POA: Diagnosis not present

## 2021-07-02 DIAGNOSIS — Z95828 Presence of other vascular implants and grafts: Secondary | ICD-10-CM

## 2021-07-02 DIAGNOSIS — Z90722 Acquired absence of ovaries, bilateral: Secondary | ICD-10-CM | POA: Diagnosis not present

## 2021-07-02 LAB — CBC WITH DIFFERENTIAL (CANCER CENTER ONLY)
Abs Immature Granulocytes: 0.01 10*3/uL (ref 0.00–0.07)
Basophils Absolute: 0 10*3/uL (ref 0.0–0.1)
Basophils Relative: 1 %
Eosinophils Absolute: 0.1 10*3/uL (ref 0.0–0.5)
Eosinophils Relative: 2 %
HCT: 31.3 % — ABNORMAL LOW (ref 36.0–46.0)
Hemoglobin: 10.2 g/dL — ABNORMAL LOW (ref 12.0–15.0)
Immature Granulocytes: 0 %
Lymphocytes Relative: 28 %
Lymphs Abs: 1.2 10*3/uL (ref 0.7–4.0)
MCH: 29.2 pg (ref 26.0–34.0)
MCHC: 32.6 g/dL (ref 30.0–36.0)
MCV: 89.7 fL (ref 80.0–100.0)
Monocytes Absolute: 0.5 10*3/uL (ref 0.1–1.0)
Monocytes Relative: 13 %
Neutro Abs: 2.3 10*3/uL (ref 1.7–7.7)
Neutrophils Relative %: 56 %
Platelet Count: 161 10*3/uL (ref 150–400)
RBC: 3.49 MIL/uL — ABNORMAL LOW (ref 3.87–5.11)
RDW: 13.8 % (ref 11.5–15.5)
WBC Count: 4.1 10*3/uL (ref 4.0–10.5)
nRBC: 0 % (ref 0.0–0.2)

## 2021-07-02 LAB — CMP (CANCER CENTER ONLY)
ALT: 15 U/L (ref 0–44)
AST: 22 U/L (ref 15–41)
Albumin: 3.4 g/dL — ABNORMAL LOW (ref 3.5–5.0)
Alkaline Phosphatase: 48 U/L (ref 38–126)
Anion gap: 9 (ref 5–15)
BUN: 33 mg/dL — ABNORMAL HIGH (ref 8–23)
CO2: 23 mmol/L (ref 22–32)
Calcium: 9.8 mg/dL (ref 8.9–10.3)
Chloride: 107 mmol/L (ref 98–111)
Creatinine: 0.97 mg/dL (ref 0.44–1.00)
GFR, Estimated: >60 mL/min (ref >60)
Glucose, Bld: 106 mg/dL — ABNORMAL HIGH (ref 70–99)
Potassium: 3.9 mmol/L (ref 3.5–5.1)
Sodium: 139 mmol/L (ref 135–145)
Total Bilirubin: 0.5 mg/dL (ref 0.3–1.2)
Total Protein: 7.8 g/dL (ref 6.5–8.1)

## 2021-07-02 MED ORDER — HEPARIN SOD (PORK) LOCK FLUSH 100 UNIT/ML IV SOLN
500.0000 [IU] | Freq: Once | INTRAVENOUS | Status: AC | PRN
  Administered 2021-07-02: 500 [IU]

## 2021-07-02 MED ORDER — DIPHENHYDRAMINE HCL 25 MG PO CAPS
25.0000 mg | ORAL_CAPSULE | Freq: Once | ORAL | Status: AC
  Administered 2021-07-02: 25 mg via ORAL
  Filled 2021-07-02: qty 1

## 2021-07-02 MED ORDER — TRASTUZUMAB-ANNS CHEMO 150 MG IV SOLR
6.0000 mg/kg | Freq: Once | INTRAVENOUS | Status: AC
  Administered 2021-07-02: 525 mg via INTRAVENOUS
  Filled 2021-07-02: qty 25

## 2021-07-02 MED ORDER — SODIUM CHLORIDE 0.9 % IV SOLN: Freq: Once | INTRAVENOUS | Status: AC

## 2021-07-02 MED ORDER — SODIUM CHLORIDE 0.9% FLUSH
10.0000 mL | INTRAVENOUS | Status: DC | PRN
  Administered 2021-07-02: 10 mL via INTRAVENOUS

## 2021-07-02 MED ORDER — SODIUM CHLORIDE 0.9% FLUSH
10.0000 mL | INTRAVENOUS | Status: DC | PRN
  Administered 2021-07-02: 10 mL

## 2021-07-02 MED ORDER — ACETAMINOPHEN 325 MG PO TABS
650.0000 mg | ORAL_TABLET | Freq: Once | ORAL | Status: AC
  Administered 2021-07-02: 650 mg via ORAL
  Filled 2021-07-02: qty 2

## 2021-07-02 NOTE — Patient Instructions (Signed)
[facility]ICAL [facility]COL[facility]   ?Discharge Instructions: ?Thank you for choosing [facility] to provide your oncology and hematology care.  ? ?If you have a lab appointment with the Cancer Center, please go directly to the Cancer Center and check in at the registration area. ?  ?Wear comfortable clothing and clothing appropriate for easy access to any Portacath or PICC line.  ? ?We strive to give you quality time with your provider. You may need to reschedule your appointment if you arrive late (15 or more minutes).  Arriving late affects you and other patients whose appointments are after yours.  Also, if you miss three or more appointments without notifying the office, you may be dismissed from the clinic at the provider?s discretion.    ?  ?For prescription refill requests, have your pharmacy contact our office and allow 72 hours for refills to be completed.   ? ?Today you received the following chemotherapy and/or immunotherapy agents: trastuzumab-anns    ?  ?To help prevent nausea and vomiting after your treatment, we encourage you to take your nausea medication as directed. ? ?BELOW ARE SYMPTOMS THAT SHOULD BE REPORTED IMMEDIATELY: ?*FEVER GREATER THAN 100.4 F (38 ?C) OR HIGHER ?*CHILLS OR SWEATING ?*NAUSEA AND VOMITING THAT IS NOT CONTROLLED WITH YOUR NAUSEA MEDICATION ?*UNUSUAL SHORTNESS OF BREATH ?*UNUSUAL BRUISING OR BLEEDING ?*URINARY PROBLEMS (pain or burning when urinating, or frequent urination) ?*BOWEL PROBLEMS (unusual diarrhea, constipation, pain near the anus) ?TENDERNESS IN MOUTH AND THROAT WITH OR WITHOUT PRESENCE OF ULCERS (sore throat, sores in mouth, or a toothache) ?UNUSUAL RASH, SWELLING OR PAIN  ?UNUSUAL VAGINAL DISCHARGE OR ITCHING  ? ?Items with * indicate a potential emergency and should be followed up as soon as possible or go to the Emergency Department if any problems should occur. ? ?Please show the CHEMOTHERAPY ALERT CARD or IMMUNOTHERAPY ALERT CARD at  check-in to the Emergency Department and triage nurse. ? ?Should you have questions after your visit or need to cancel or reschedule your appointment, please contact [facility]  Dept: [phone]  and follow the prompts.  Office hours are 8:00 a.m. to 4:30 p.m. Monday - Friday. Please note that voicemails left after 4:00 p.m. may not be returned until the following business day.  We are closed weekends and major holidays. You have access to a nurse at all times for urgent questions. Please call the main number to the clinic Dept: [phone] and follow the prompts. ? ? ?For any non-urgent questions, you may also contact your provider using MyChart. We now offer e-Visits for anyone 18 and older to request care online for non-urgent symptoms. For details visit [email or web address]. ?  ?Also download the MyChart app! Go to the app store, search "MyChart", open the app, select [facility], and log in with your MyChart username and password. ? ?Due to Covid, a mask is required upon entering the hospital/clinic. If you do not have a mask, one will be given to you upon arrival. For doctor visits, patients may have 1 support person aged 18 or older with them. For treatment visits, patients cannot have anyone with them due to current Covid guidelines and our immunocompromised population.  ? ?

## 2021-07-09 ENCOUNTER — Ambulatory Visit: Payer: 59 | Admitting: Internal Medicine

## 2021-07-09 ENCOUNTER — Encounter: Payer: Self-pay | Admitting: Internal Medicine

## 2021-07-09 ENCOUNTER — Other Ambulatory Visit: Payer: Self-pay

## 2021-07-09 VITALS — BP 122/80 | HR 61 | Temp 98.8°F | Ht 59.0 in | Wt 211.0 lb

## 2021-07-09 DIAGNOSIS — E559 Vitamin D deficiency, unspecified: Secondary | ICD-10-CM

## 2021-07-09 DIAGNOSIS — F32A Depression, unspecified: Secondary | ICD-10-CM

## 2021-07-09 DIAGNOSIS — B029 Zoster without complications: Secondary | ICD-10-CM

## 2021-07-09 DIAGNOSIS — Z0001 Encounter for general adult medical examination with abnormal findings: Secondary | ICD-10-CM | POA: Diagnosis not present

## 2021-07-09 DIAGNOSIS — G619 Inflammatory polyneuropathy, unspecified: Secondary | ICD-10-CM

## 2021-07-09 DIAGNOSIS — E119 Type 2 diabetes mellitus without complications: Secondary | ICD-10-CM

## 2021-07-09 DIAGNOSIS — M5412 Radiculopathy, cervical region: Secondary | ICD-10-CM

## 2021-07-09 DIAGNOSIS — I10 Essential (primary) hypertension: Secondary | ICD-10-CM

## 2021-07-09 DIAGNOSIS — E7800 Pure hypercholesterolemia, unspecified: Secondary | ICD-10-CM

## 2021-07-09 DIAGNOSIS — Q7649 Other congenital malformations of spine, not associated with scoliosis: Secondary | ICD-10-CM

## 2021-07-09 DIAGNOSIS — M4802 Spinal stenosis, cervical region: Secondary | ICD-10-CM | POA: Diagnosis not present

## 2021-07-09 DIAGNOSIS — Z23 Encounter for immunization: Secondary | ICD-10-CM

## 2021-07-09 DIAGNOSIS — F419 Anxiety disorder, unspecified: Secondary | ICD-10-CM

## 2021-07-09 MED ORDER — VALACYCLOVIR HCL 1 G PO TABS: 1000.0000 mg | ORAL_TABLET | Freq: Two times a day (BID) | ORAL | 0 refills | Status: AC

## 2021-07-09 NOTE — Patient Instructions (Addendum)
You will be contacted regarding the referral for: Neurosurgury   Please take all new medication as prescribed - the shingles antibiotic  You had the flu shot today  You may want to consider the Novavax Covid vaccine next month instead of the booster shots  Please continue all other medications as before, and refills have been done if requested.  Please have the pharmacy call with any other refills you may need.  Please continue your efforts at being more active, low cholesterol diet, and weight control.  You are otherwise up to date with prevention measures today.  Please keep your appointments with your specialists as you may have planned  We can hold on further lab testing today  Please make an Appointment to return in 6 months, or sooner if needed
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
## 2021-07-13 ENCOUNTER — Encounter: Payer: Self-pay | Admitting: Internal Medicine

## 2021-07-13 DIAGNOSIS — B029 Zoster without complications: Secondary | ICD-10-CM | POA: Insufficient documentation

## 2021-07-13 DIAGNOSIS — M5412 Radiculopathy, cervical region: Secondary | ICD-10-CM | POA: Insufficient documentation

## 2021-07-13 DIAGNOSIS — M4802 Spinal stenosis, cervical region: Secondary | ICD-10-CM | POA: Insufficient documentation

## 2021-07-13 NOTE — Assessment & Plan Note (Signed)
Last vitamin D Lab Results  Component Value Date   VD25OH <7.00 (L) 04/28/2019   Low, to start oral replacement

## 2021-07-13 NOTE — Assessment & Plan Note (Signed)
With mild symptoms only, for valtex course,  to f/u any worsening symptoms or concerns

## 2021-07-13 NOTE — Assessment & Plan Note (Signed)
Age and sex appropriate education and counseling updated with regular exercise and diet Referrals for preventative services - none needed Immunizations addressed - declines covid booster, shingrix, Tdap Smoking counseling  - none needed Evidence for depression or other mood disorder - chronic anxiety/depression no change Most recent labs reviewed. I have personally reviewed and have noted: 1) the patient's medical and social history 2) The patient's current medications and supplements 3) The patient's height, weight, and BMI have been recorded in the chart

## 2021-07-13 NOTE — Assessment & Plan Note (Signed)
For new NS referral,  to f/u any worsening symptoms or concerns

## 2021-07-13 NOTE — Assessment & Plan Note (Signed)
Lab Results  Component Value Date   HGBA1C 5.9 03/05/2021   Stable, pt to continue current medical treatment - diet

## 2021-07-13 NOTE — Assessment & Plan Note (Signed)
NONE per pt after rheum eval,  to f/u any worsening symptoms or concerns

## 2021-07-13 NOTE — Assessment & Plan Note (Signed)
BP Readings from Last 3 Encounters:  07/09/21 122/80  07/02/21 127/63  06/11/21 (!) 143/85   Stable, pt to continue medical treatment coreg, micardis hct

## 2021-07-13 NOTE — Assessment & Plan Note (Signed)
Chronic stable it seems, delcines change in tx or referral for counseling

## 2021-07-13 NOTE — Assessment & Plan Note (Signed)
Lab Results  Component Value Date   LDLCALC 110 (H) 03/05/2021   Uncontrolled, goal ldl < 100, pt to continue restart statin pravachol, and low chol diet

## 2021-07-23 ENCOUNTER — Inpatient Hospital Stay: Payer: 59

## 2021-07-23 ENCOUNTER — Other Ambulatory Visit: Payer: Self-pay

## 2021-07-23 ENCOUNTER — Inpatient Hospital Stay: Payer: 59 | Attending: Oncology

## 2021-07-23 VITALS — BP 115/92 | HR 65 | Temp 98.0°F | Resp 18 | Wt 212.0 lb

## 2021-07-23 DIAGNOSIS — C50912 Malignant neoplasm of unspecified site of left female breast: Secondary | ICD-10-CM | POA: Diagnosis present

## 2021-07-23 DIAGNOSIS — Z7901 Long term (current) use of anticoagulants: Secondary | ICD-10-CM | POA: Diagnosis not present

## 2021-07-23 DIAGNOSIS — C773 Secondary and unspecified malignant neoplasm of axilla and upper limb lymph nodes: Secondary | ICD-10-CM | POA: Insufficient documentation

## 2021-07-23 DIAGNOSIS — Z79899 Other long term (current) drug therapy: Secondary | ICD-10-CM | POA: Insufficient documentation

## 2021-07-23 DIAGNOSIS — D6481 Anemia due to antineoplastic chemotherapy: Secondary | ICD-10-CM | POA: Diagnosis not present

## 2021-07-23 DIAGNOSIS — T451X5A Adverse effect of antineoplastic and immunosuppressive drugs, initial encounter: Secondary | ICD-10-CM | POA: Insufficient documentation

## 2021-07-23 DIAGNOSIS — C50112 Malignant neoplasm of central portion of left female breast: Secondary | ICD-10-CM

## 2021-07-23 DIAGNOSIS — C771 Secondary and unspecified malignant neoplasm of intrathoracic lymph nodes: Secondary | ICD-10-CM

## 2021-07-23 DIAGNOSIS — Z171 Estrogen receptor negative status [ER-]: Secondary | ICD-10-CM

## 2021-07-23 DIAGNOSIS — Z8542 Personal history of malignant neoplasm of other parts of uterus: Secondary | ICD-10-CM | POA: Diagnosis not present

## 2021-07-23 DIAGNOSIS — Z86718 Personal history of other venous thrombosis and embolism: Secondary | ICD-10-CM | POA: Diagnosis not present

## 2021-07-23 DIAGNOSIS — Z79810 Long term (current) use of selective estrogen receptor modulators (SERMs): Secondary | ICD-10-CM | POA: Insufficient documentation

## 2021-07-23 DIAGNOSIS — Z5112 Encounter for antineoplastic immunotherapy: Secondary | ICD-10-CM | POA: Insufficient documentation

## 2021-07-23 DIAGNOSIS — Z95828 Presence of other vascular implants and grafts: Secondary | ICD-10-CM

## 2021-07-23 DIAGNOSIS — C50812 Malignant neoplasm of overlapping sites of left female breast: Secondary | ICD-10-CM

## 2021-07-23 LAB — CBC WITH DIFFERENTIAL (CANCER CENTER ONLY)
Abs Immature Granulocytes: 0.02 10*3/uL (ref 0.00–0.07)
Basophils Absolute: 0 10*3/uL (ref 0.0–0.1)
Basophils Relative: 0 %
Eosinophils Absolute: 0.1 10*3/uL (ref 0.0–0.5)
Eosinophils Relative: 1 %
HCT: 32.5 % — ABNORMAL LOW (ref 36.0–46.0)
Hemoglobin: 10.4 g/dL — ABNORMAL LOW (ref 12.0–15.0)
Immature Granulocytes: 0 %
Lymphocytes Relative: 31 %
Lymphs Abs: 1.6 10*3/uL (ref 0.7–4.0)
MCH: 28.7 pg (ref 26.0–34.0)
MCHC: 32 g/dL (ref 30.0–36.0)
MCV: 89.5 fL (ref 80.0–100.0)
Monocytes Absolute: 0.6 10*3/uL (ref 0.1–1.0)
Monocytes Relative: 12 %
Neutro Abs: 3 10*3/uL (ref 1.7–7.7)
Neutrophils Relative %: 56 %
Platelet Count: 190 10*3/uL (ref 150–400)
RBC: 3.63 MIL/uL — ABNORMAL LOW (ref 3.87–5.11)
RDW: 13.9 % (ref 11.5–15.5)
WBC Count: 5.3 10*3/uL (ref 4.0–10.5)
nRBC: 0 % (ref 0.0–0.2)

## 2021-07-23 LAB — CMP (CANCER CENTER ONLY)
ALT: 14 U/L (ref 0–44)
AST: 20 U/L (ref 15–41)
Albumin: 3.5 g/dL (ref 3.5–5.0)
Alkaline Phosphatase: 46 U/L (ref 38–126)
Anion gap: 11 (ref 5–15)
BUN: 42 mg/dL — ABNORMAL HIGH (ref 8–23)
CO2: 25 mmol/L (ref 22–32)
Calcium: 9.7 mg/dL (ref 8.9–10.3)
Chloride: 103 mmol/L (ref 98–111)
Creatinine: 1.28 mg/dL — ABNORMAL HIGH (ref 0.44–1.00)
GFR, Estimated: 47 mL/min — ABNORMAL LOW (ref >60)
Glucose, Bld: 110 mg/dL — ABNORMAL HIGH (ref 70–99)
Potassium: 3.7 mmol/L (ref 3.5–5.1)
Sodium: 139 mmol/L (ref 135–145)
Total Bilirubin: 0.4 mg/dL (ref 0.3–1.2)
Total Protein: 8.4 g/dL — ABNORMAL HIGH (ref 6.5–8.1)

## 2021-07-23 MED ORDER — HEPARIN SOD (PORK) LOCK FLUSH 100 UNIT/ML IV SOLN
500.0000 [IU] | Freq: Once | INTRAVENOUS | Status: AC | PRN
  Administered 2021-07-23: 500 [IU]

## 2021-07-23 MED ORDER — SODIUM CHLORIDE 0.9 % IV SOLN: Freq: Once | INTRAVENOUS | Status: AC

## 2021-07-23 MED ORDER — SODIUM CHLORIDE 0.9% FLUSH
10.0000 mL | INTRAVENOUS | Status: DC | PRN
  Administered 2021-07-23: 10 mL

## 2021-07-23 MED ORDER — DIPHENHYDRAMINE HCL 25 MG PO CAPS
25.0000 mg | ORAL_CAPSULE | Freq: Once | ORAL | Status: AC
  Administered 2021-07-23: 25 mg via ORAL
  Filled 2021-07-23: qty 1

## 2021-07-23 MED ORDER — ACETAMINOPHEN 325 MG PO TABS
650.0000 mg | ORAL_TABLET | Freq: Once | ORAL | Status: AC
  Administered 2021-07-23: 650 mg via ORAL
  Filled 2021-07-23: qty 2

## 2021-07-23 MED ORDER — TRASTUZUMAB-ANNS CHEMO 150 MG IV SOLR
6.0000 mg/kg | Freq: Once | INTRAVENOUS | Status: AC
  Administered 2021-07-23: 525 mg via INTRAVENOUS
  Filled 2021-07-23: qty 25

## 2021-07-23 MED ORDER — SODIUM CHLORIDE 0.9% FLUSH
10.0000 mL | Freq: Once | INTRAVENOUS | Status: AC
  Administered 2021-07-23: 10 mL via INTRAVENOUS

## 2021-07-23 NOTE — Patient Instructions (Signed)
[facility]  Discharge Instructions: ?Thank you for choosing [facility] to provide your oncology and hematology care.  ? ?If you have a lab appointment with the Cancer Center, please go directly to the Cancer Center and check in at the registration area. ?  ?Wear comfortable clothing and clothing appropriate for easy access to any Portacath or PICC line.  ? ?We strive to give you quality time with your provider. You may need to reschedule your appointment if you arrive late (15 or more minutes).  Arriving late affects you and other patients whose appointments are after yours.  Also, if you miss three or more appointments without notifying the office, you may be dismissed from the clinic at the provider?s discretion.    ?  ?For prescription refill requests, have your pharmacy contact our office and allow 72 hours for refills to be completed.   ? ?Today you received the following chemotherapy and/or immunotherapy agents: Trastuzumab    ?  ?To help prevent nausea and vomiting after your treatment, we encourage you to take your nausea medication as directed. ? ?BELOW ARE SYMPTOMS THAT SHOULD BE REPORTED IMMEDIATELY: ?*FEVER GREATER THAN 100.4 F (38 ?C) OR HIGHER ?*CHILLS OR SWEATING ?*NAUSEA AND VOMITING THAT IS NOT CONTROLLED WITH YOUR NAUSEA MEDICATION ?*UNUSUAL SHORTNESS OF BREATH ?*UNUSUAL BRUISING OR BLEEDING ?*URINARY PROBLEMS (pain or burning when urinating, or frequent urination) ?*BOWEL PROBLEMS (unusual diarrhea, constipation, pain near the anus) ?TENDERNESS IN MOUTH AND THROAT WITH OR WITHOUT PRESENCE OF ULCERS (sore throat, sores in mouth, or a toothache) ?UNUSUAL RASH, SWELLING OR PAIN  ?UNUSUAL VAGINAL DISCHARGE OR ITCHING  ? ?Items with * indicate a potential emergency and should be followed up as soon as possible or go to the Emergency Department if any problems should occur. ? ?Please show the CHEMOTHERAPY ALERT CARD or IMMUNOTHERAPY ALERT CARD at check-in  to the Emergency Department and triage nurse. ? ?Should you have questions after your visit or need to cancel or reschedule your appointment, please contact [facility]  Dept: [phone]  and follow the prompts.  Office hours are 8:00 a.m. to 4:30 p.m. Monday - Friday. Please note that voicemails left after 4:00 p.m. may not be returned until the following business day.  We are closed weekends and major holidays. You have access to a nurse at all times for urgent questions. Please call the main number to the clinic Dept: [phone] and follow the prompts. ? ? ?For any non-urgent questions, you may also contact your provider using MyChart. We now offer e-Visits for anyone 18 and older to request care online for non-urgent symptoms. For details visit [email or web address]. ?  ?Also download the MyChart app! Go to the app store, search "MyChart", open the app, select [facility], and log in with your MyChart username and password. ? ?Due to Covid, a mask is required upon entering the hospital/clinic. If you do not have a mask, one will be given to you upon arrival. For doctor visits, patients may have 1 support person aged 18 or older with them. For treatment visits, patients cannot have anyone with them due to current Covid guidelines and our immunocompromised population.  ? ?

## 2021-08-04 ENCOUNTER — Other Ambulatory Visit: Payer: Self-pay

## 2021-08-04 ENCOUNTER — Encounter (HOSPITAL_COMMUNITY): Payer: Self-pay | Admitting: Cardiology

## 2021-08-04 ENCOUNTER — Ambulatory Visit (HOSPITAL_COMMUNITY)
Admission: RE | Admit: 2021-08-04 | Discharge: 2021-08-04 | Disposition: A | Discharge status: Home / Self Care | Payer: 59 | Source: Ambulatory Visit | Attending: Cardiology | Admitting: Cardiology

## 2021-08-04 ENCOUNTER — Ambulatory Visit (HOSPITAL_BASED_OUTPATIENT_CLINIC_OR_DEPARTMENT_OTHER)
Admission: RE | Admit: 2021-08-04 | Discharge: 2021-08-04 | Disposition: A | Discharge status: Home / Self Care | Payer: 59 | Source: Ambulatory Visit | Attending: Cardiology | Admitting: Cardiology

## 2021-08-04 VITALS — BP 110/70 | HR 60 | Wt 220.0 lb

## 2021-08-04 DIAGNOSIS — E669 Obesity, unspecified: Secondary | ICD-10-CM | POA: Diagnosis not present

## 2021-08-04 DIAGNOSIS — Z90722 Acquired absence of ovaries, bilateral: Secondary | ICD-10-CM | POA: Insufficient documentation

## 2021-08-04 DIAGNOSIS — I77819 Aortic ectasia, unspecified site: Secondary | ICD-10-CM | POA: Insufficient documentation

## 2021-08-04 DIAGNOSIS — G5600 Carpal tunnel syndrome, unspecified upper limb: Secondary | ICD-10-CM | POA: Insufficient documentation

## 2021-08-04 DIAGNOSIS — Z6841 Body Mass Index (BMI) 40.0 and over, adult: Secondary | ICD-10-CM

## 2021-08-04 DIAGNOSIS — I82502 Chronic embolism and thrombosis of unspecified deep veins of left lower extremity: Secondary | ICD-10-CM | POA: Insufficient documentation

## 2021-08-04 DIAGNOSIS — Z79899 Other long term (current) drug therapy: Secondary | ICD-10-CM | POA: Insufficient documentation

## 2021-08-04 DIAGNOSIS — I5032 Chronic diastolic (congestive) heart failure: Secondary | ICD-10-CM | POA: Diagnosis not present

## 2021-08-04 DIAGNOSIS — E785 Hyperlipidemia, unspecified: Secondary | ICD-10-CM | POA: Insufficient documentation

## 2021-08-04 DIAGNOSIS — Z8542 Personal history of malignant neoplasm of other parts of uterus: Secondary | ICD-10-CM | POA: Diagnosis not present

## 2021-08-04 DIAGNOSIS — I110 Hypertensive heart disease with heart failure: Secondary | ICD-10-CM | POA: Diagnosis present

## 2021-08-04 DIAGNOSIS — Z803 Family history of malignant neoplasm of breast: Secondary | ICD-10-CM | POA: Insufficient documentation

## 2021-08-04 DIAGNOSIS — Z90710 Acquired absence of both cervix and uterus: Secondary | ICD-10-CM | POA: Diagnosis not present

## 2021-08-04 DIAGNOSIS — Z7901 Long term (current) use of anticoagulants: Secondary | ICD-10-CM | POA: Diagnosis not present

## 2021-08-04 DIAGNOSIS — Z833 Family history of diabetes mellitus: Secondary | ICD-10-CM | POA: Diagnosis not present

## 2021-08-04 DIAGNOSIS — I5042 Chronic combined systolic (congestive) and diastolic (congestive) heart failure: Secondary | ICD-10-CM | POA: Insufficient documentation

## 2021-08-04 DIAGNOSIS — Z853 Personal history of malignant neoplasm of breast: Secondary | ICD-10-CM | POA: Insufficient documentation

## 2021-08-04 DIAGNOSIS — R2689 Other abnormalities of gait and mobility: Secondary | ICD-10-CM | POA: Insufficient documentation

## 2021-08-04 DIAGNOSIS — T451X5D Adverse effect of antineoplastic and immunosuppressive drugs, subsequent encounter: Secondary | ICD-10-CM | POA: Insufficient documentation

## 2021-08-04 DIAGNOSIS — E1142 Type 2 diabetes mellitus with diabetic polyneuropathy: Secondary | ICD-10-CM | POA: Insufficient documentation

## 2021-08-04 DIAGNOSIS — I427 Cardiomyopathy due to drug and external agent: Secondary | ICD-10-CM | POA: Diagnosis not present

## 2021-08-04 LAB — ECHOCARDIOGRAM COMPLETE
Area-P 1/2: 3.17 cm2
Calc EF: 63.6 %
S' Lateral: 3.4 cm
Single Plane A2C EF: 61.6 %
Single Plane A4C EF: 66.2 %

## 2021-08-04 NOTE — Patient Instructions (Signed)
No Labs done today.   No medication changes were made. Please continue all current medications as prescribed.  You have been referred to [facility]. They will contact you to schedule an appointment.   Your physician recommends that you schedule a follow-up appointment in: 6 months with an echo prior to your exam. Please contact our office in March 2023 to schedule an April 2023 appointment.  If you have any questions or concerns before your next appointment please send [place] a message through [place] or call our office at [phone].    TO LEAVE A MESSAGE FOR THE NURSE SELECT OPTION 2, PLEASE LEAVE A MESSAGE INCLUDING: YOUR NAME DATE OF BIRTH CALL BACK NUMBER REASON FOR CALL**this is important as we prioritize the call backs  YOU WILL RECEIVE A CALL BACK THE SAME DAY AS LONG AS YOU CALL BEFORE 4:00 PM   Do the following things EVERYDAY: Weigh yourself in the morning before breakfast. Write it down and keep it in a log. Take your medicines as prescribed Eat low salt foods--Limit salt (sodium) to 2000 mg per day.  Stay as active as you can everyday Limit all fluids for the day to less than 2 liters   At the [facility], you and your health needs are our priority. As part of our continuing mission to provide you with exceptional heart care, we have created designated Provider Care Teams. These Care Teams include your primary Cardiologist (physician) and Advanced Practice Providers (APPs- Physician Assistants and Nurse Practitioners) who all work together to provide you with the care you need, when you need it.   You may see any of the following providers on your designated Care Team at your next follow up: Dr Glori Bickers Dr Haynes Kerns, NP Lyda Jester, [place] Audry Riles, PharmD   Please be sure to bring in all your medications bottles to every appointment.

## 2021-08-04 NOTE — Progress Notes (Signed)
Oncology: Dr. Jana Hakim  65 y.o. with history of DM, HTN, hyperlipidemia, endometrial cancer, and breast cancer was referred by Dr. Jana Hakim for cardio-oncology evaluation.  Initial breast cancer diagnosis was in 2012.  She had left breast lumpectomy and radiation.  Recurrence in 7/20, ER+/PR-/HER2+.  She had chemotherapy with trastuzumab/pertuzumab/carboplatin/docetaxol x 6 starting 2/21.  With fall in EF in 4/21, Herceptin was held.  Echo in 4/21 showed EF 45-50%, Coreg was started. Echo in 6/21 showed EF back up to 55-60%, and Herceptin was restarted.  In 6/21, she had lumpectomy.  Echo in 7/21 showed EF stable at 55-60%.    Echo in 9/21 showed EF 55-60% with normal RV, strain images did not track well.  Coronary CTA was done in 9/21, showing coronary artery calcium score 0 and no significant CAD.    PET in 9/21 with new lymphadenopathy.  She was started on TDM-1/Kadcyla but later changed back to Herceptin which will continue for long-term.   In 12/21, she had a cervical discectomy and fusion.   Echo in 2/22 showed EF 55% with GLS -10.7% but poor endocardial tracing. Normal RV.  Echo in 4/22 showed EF 50-55%, mild LVH, normal RV, GLS -14.3% but poor endocardial tracing. Echo in 7/22 showed EF 55-60%, mild LVH, mild LAE, GLS -20.1%, normal RV, PASP 32 mmHg.  Echo was done today with EF 55-60%, mild LVH, GLS -20.1%, normal RV.   Patient returns for followup of diastolic CHF.  Pain complaint continues to be pain and burning in hands and feet consistent with neuropathy.  This is quite severe.  She has contractures in her fingers and foot drop.  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  It is hard for her to exercise much due to neuropathy and poor balance.  Breathing is doing "ok."  No dyspnea walking on flat ground.  No chest pain. Weight is up about 20 lbs.  She reports poor diet.   Labs (5/21): K 4.1, creatinine 0.89 Labs (7/21): K 4.2, creatinine 0.95 Labs (8/21): LDL 81,  BNP 36 Labs (9/21): K 3.7, creatinine 0.94 Labs (1/22): K 3.2, creatinine 1.14 Labs (4/22): K 4.6, creatinine 1.02 Labs (5/22): negative serum and urine immunofixation Labs (6/22): ESR 87, ANA negative, ANCA negative, CK 225, hgb 10.2, LDL 110, K 3.7, creatinine 1.38 Labs (10/22): K 3.7, creatinine 1.28  PMH: 1. HTN 2. Diabetes 3. Endometrial carcinoma: 8/17 diagnosis, had hysterectomy and BSO.  4. Hyperlipidemia 5. Breast cancer: Initial diagnosis in 2012.  Had left breast lumpectomy and radiation.  Recurrence in 7/20, ER+/PR-/HER2+.  She had chemotherapy with trastuzumab/pertuzumab/carboplatin/docetaxol x 6 starting 2/21.  With fall in EF in 4/21, Herceptin was held but later restarted.  Lumpectomy 6/21.  - Echo (1/21): EF 60-65%, GLS -20%, normal RV.  - Echo (4/21): EF 45-50%, global hypokinesis, GLS -12.6%, mildly decreased RV function.  - Echo (6/21): EF 55-60%, GLS -14.4%, mild LVH, PASP 38, RV normal.  - Echo (7/21): EF 55-60%, mild LVH, GLS -16.9%, RV  Normal - Echo (9/21): EF 47-42%, grade 2 diastolic dysfunction, GLS -11.6% (poor endocardial tracking), normal RV, normal IVC.  - Echo (2/22): EF 55%, GLS -10.7% (poor endocardial tracking), normal RV.  - Echo (4/22): EF 50-55%, mild LVH, normal RV, GLS -14.3% but poor endocardial tracing. - Echo (7/22): EF 55-60%, mild LVH, mild LAE, GLS -20.1%, normal RV, PASP 32 mmHg.  - Echo (10/22): EF 55-60%, mild LVH, GLS -20.1%, normal RV.  6. Coronary calcium score scan (8/21):  CAC 0 Agatston units.  - Coronary CTA (9/21): CAC 0 Agatston units.  No significant CAD.  7. DVT: Left leg, 8/21.  8. Cervical discectomy and fusion in 12/21.  9. Peripheral neuropathy 10. Carpal tunnel syndrome  Social History   Socioeconomic History   Marital status: Divorced    Spouse name: Not on file   Number of children: 0   Years of education: Not on file   Highest education level: Not on file  Occupational History   Occupation: Education officer, museum     Employer: [employer]    Comment: retired  Tobacco Use   Smoking status: Never   Smokeless tobacco: Never  Scientific laboratory technician Use: Never used  Substance and Sexual Activity   Alcohol use: Not Currently   Drug use: No   Sexual activity: Not Currently  Other Topics Concern   Not on file  Social History Narrative   1 son deceased with homicide   Patient has been divorced twice   Right handed   Drinks caffeine   One story    Social Determinants of Health   Financial Resource Strain: Not on file  Food Insecurity: Not on file  Transportation Needs: Unmet Transportation Needs   Lack of Transportation (Medical): Yes   Lack of Transportation (Non-Medical): Yes  Physical Activity: Not on file  Stress: Not on file  Social Connections: Not on file  Intimate Partner Violence: Not on file   Family History  Problem Relation Age of Onset   Diabetes Mother    Hypertension Mother    Heart disease Father        COPD   Alcohol abuse Father        ETOH dependence   Breast cancer Maternal Aunt        dx in her 58s   Lung cancer Maternal Uncle    Breast cancer Paternal Aunt    Cancer Maternal Grandmother        salivary gland cancer   Colon cancer Neg Hx    ROS: All systems reviewed and negative except as per HPI.   Current Outpatient Medications  Medication Sig Dispense Refill   carvedilol (COREG) 6.25 MG tablet Take 1 tablet (6.25 mg total) by mouth 2 (two) times daily. 180 tablet 3   furosemide (LASIX) 40 MG tablet TAKE 1 TABLET BY MOUTH EVERY DAY 30 tablet 3   gabapentin (NEURONTIN) 300 MG capsule Take 2 capsules (600 mg total) by mouth 3 (three) times daily. 480 capsule 1   glucose blood (ONE TOUCH ULTRA TEST) test strip 1 each by Other route 2 (two) times daily. Use to check blood sugars twice a day Dx E11.9 100 each 0   Lancets (ONETOUCH ULTRASOFT) lancets 1 each by Other route 2 (two) times daily. Use to check blood sugars twice a day Dx E11.9 100 each 0    lidocaine-prilocaine (EMLA) cream 4 gram tid prn 120 g 5   omeprazole (PRILOSEC) 40 MG capsule Take 40 mg by mouth daily as needed (Heartburn).     potassium chloride SA (KLOR-CON M20) 20 MEQ tablet Take 1 tablet (20 mEq total) by mouth daily. 90 tablet 3   pravastatin (PRAVACHOL) 40 MG tablet Take 1 tablet (40 mg total) by mouth daily. 90 tablet 3   rivaroxaban (XARELTO) 20 MG TABS tablet Take 1 tablet (20 mg total) by mouth daily with supper. 90 tablet 3   silver sulfADIAZINE (SILVADENE) 1 % cream Apply 1 application topically as needed.  telmisartan-hydrochlorothiazide (MICARDIS HCT) 80-12.5 MG tablet Take 1 tablet by mouth daily. 90 tablet 3   No current facility-administered medications for this encounter.   Facility-Administered Medications Ordered in Other Encounters  Medication Dose Route Frequency Provider Last Rate Last Admin   cloNIDine (CATAPRES) tablet 0.1 mg  0.1 mg Oral Daily Tanner, Lucianne Lei E., PA-C   0.1 mg at 11/21/19 1742   BP 110/70   Pulse 60   Wt 99.8 kg (220 lb)   LMP 05/18/2016   SpO2 100%   BMI 44.43 kg/m  General: NAD Neck: JVP 8 cm, no thyromegaly or thyroid nodule.  Lungs: Clear to auscultation bilaterally with normal respiratory effort. CV: Nondisplaced PMI.  Heart regular S1/S2, no S3/S4, no murmur.  1+ ankle edema.  No carotid bruit.  Normal pedal pulses.  Abdomen: Soft, nontender, no hepatosplenomegaly, no distention.  Skin: Intact without lesions or rashes.  Neurologic: Alert and oriented x 3.  Psych: Normal affect. Extremities: No clubbing or cyanosis.  HEENT: Normal.   Assessment/Plan: 1. Chemotherapy-related cardiomyopathy: Fall in EF and strain in the setting of Herceptin use.  Echo in 4/21 with EF 45-50% with GLS -12.6%.  Herceptin was held, Coreg started, and echo in 6/21 showed EF up to 55-60% with GLS -14.4% => Herceptin restarted.  Echo in 7/21 with EF 55-60%, GLS more negative at -16.9%.  Echo in 2/22 and in 9/21 showed normal EF but less  negative strain.  However, strain images did not appear to track well on either echo.  Echo in 4/22  showed EF 50-55%, again with poor strain tracking.  Echo in 7/22 and in 10/22 showed EF 55-60% with GLS -20.1% and mild LVH. NYHA class II symptoms, seems most limited by neuropathy.  Possible minimal volume overload.  - Continue Coreg 6.25 mg bid, telmisartan and Lasix daily.   - She should be safe to continue Herceptin.  With stable echoes, think we can push out repeat echo to 6 months.  - With painful peripheral neuropathy and carpal tunnel syndrome as well as LVH with diastolic CHF, I was worried about the possibility of cardiac amyloidosis.  Urine and serum immunofixations negative.  Her insurance refused PYP scan.  However, I think we can hold off on this as the neuropathy seems to be more likely due to cervical myelopathy.  2. HTN: BP controlled.   3. Hyperlipidemia: Keep LDL at least < 100 with diabetes.  - Continue pravastatin.   4. Chest pain: None recently.  Coronary CTA in 9/21 was normal.  5. DVT: on left.   - Continue Xarelto long-term given DVT in setting of malignancy.  6. Obesity: Difficult for her to exercise due to neuropathy, poor balance.  - I will refer her to our pharmacy clinic for semaglutide.   Followup in 6 months with echo.   Loralie Champagne 08/04/2021

## 2021-08-04 NOTE — Progress Notes (Signed)
  Echocardiogram 2D Echocardiogram has been performed.  [name] 08/04/2021, 9:54 AM

## 2021-08-11 ENCOUNTER — Telehealth: Payer: Self-pay | Admitting: *Deleted

## 2021-08-11 NOTE — Telephone Encounter (Signed)
Connected with representative [place] of [facility] Support Program.  Received reference number: [id number] for [place] Member ID: [id number] along with alternative fax number: [phone].   Faxed 07/02/2021 visit note to Cancer Program Review; attn. Chana Bode.

## 2021-08-11 NOTE — Telephone Encounter (Signed)
[employer] Request notes of recent clinic visit that includes history and physical and recent treatment plan regimen be faxed ASAP TO [phone]; attn Chana Bode RN, CM; Phone: [phone] ext. [phone].    Recent visit note: 07/02/2021.

## 2021-08-12 ENCOUNTER — Encounter: Payer: Self-pay | Admitting: Oncology

## 2021-08-12 NOTE — Progress Notes (Signed)
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 decrease in size and SUV of lymphadenopathy, no new or progressive disease identified in abdomen, pelvis, or bones  (d) switched back to Herceptin/trastuzumab  as of 10/24/2020 secondary to patient's concerns regarding possible neuropathy  (e) repeat echocardiography 11/21/2020 shows an ejection fraction in the 55% range  (f) echocardiogram 02/17/2021 shows an ejection fraction in the 50-55% range  (g) brain MRI 11/28/2020 shows no evidence of metastatic disease  (h) PET scan 02/17/2021 shows significant response in the mediastinal adenopathy and left lateral breast mass; no lung, liver or bone lesions  (i) PET scan 06/26/2021 shows further decrease in the size and SUV of the left-sided lumpectomy, and no findings for hypermetabolic disease elsewhere; there are some reactive pelvic nodes felt secondary to obesity  (j) echo 04/30/2021 and 08/04/2021 showing no change in ejection fraction  (14) did not receive adjuvant radiation (had radiation previously 2012).  (  16) left lower extremity DVT documented by Doppler ultrasound 05/31/2020  (a)  rivaroxaban/Xarelto started 05/31/2020  (17) osteoarthritis/ degenerative disease  (a) cervical spine MRI 09/08/2020 showed a herniated nucleus pulposus at cervical 4/5, no evidence of metastatic disease within the cervical spine  (b) lumbar MRI 09/27/2020 showed significant degenerative disease, no evidence of metastasis  (c) status post anterior cervical decompression C4/5 with arthrodesis 10/02/2020 ([facility])  (18) bilateral carpal tunnel syndrome documented by electromyography 08/05/2020, severe on the right, moderate on the left Krista Blue)   PLAN: [place] is now coming up on 2 years from definitive diagnosis of metastatic breast cancer.  Her disease is very well controlled and she has no symptoms related to it.  She is also tolerating her treatment, Herceptin, without any side effects that she is aware of.  Accordingly we are making no changes.  She will receive Herceptin today and every 3 weeks indefinitely unless there is evidence of disease progression.    She is scheduled for a repeat echo in January.  She will be due for repeat mammography in February 2023 (she has measurable disease in the breast)  From my point of view there is no problem with her proceeding with dental work is planned.  Total encounter time 25 minutes.Sarajane Jews C. Magrinat, MD 08/13/21 8:49 AM Medical Oncology and Hematology [facility] [place], [place] [address] Tel. [phone]    Fax. [phone]   I, Wilburn Mylar, am acting as scribe for Dr. [name]. Magrinat.  I, Lurline Del MD, have reviewed the above documentation for accuracy and completeness, and I agree with the above.   *Total Encounter Time as defined by the Centers for Medicare and Medicaid Services includes, in addition to the face-to-face time of a patient visit (documented in the note above) non-face-to-face time: obtaining and reviewing outside history, ordering and reviewing medications, tests or  procedures, care coordination (communications with other health care professionals or caregivers) and documentation in the medical record.

## 2021-08-13 ENCOUNTER — Inpatient Hospital Stay: Payer: 59

## 2021-08-13 ENCOUNTER — Inpatient Hospital Stay (HOSPITAL_BASED_OUTPATIENT_CLINIC_OR_DEPARTMENT_OTHER): Payer: 59 | Admitting: Oncology

## 2021-08-13 ENCOUNTER — Other Ambulatory Visit: Payer: Self-pay

## 2021-08-13 VITALS — BP 131/71 | HR 64 | Temp 97.7°F | Resp 16 | Wt 218.4 lb

## 2021-08-13 DIAGNOSIS — Z171 Estrogen receptor negative status [ER-]: Secondary | ICD-10-CM

## 2021-08-13 DIAGNOSIS — Z95828 Presence of other vascular implants and grafts: Secondary | ICD-10-CM

## 2021-08-13 DIAGNOSIS — Z5112 Encounter for antineoplastic immunotherapy: Secondary | ICD-10-CM | POA: Diagnosis not present

## 2021-08-13 DIAGNOSIS — C771 Secondary and unspecified malignant neoplasm of intrathoracic lymph nodes: Secondary | ICD-10-CM

## 2021-08-13 DIAGNOSIS — C50112 Malignant neoplasm of central portion of left female breast: Secondary | ICD-10-CM

## 2021-08-13 DIAGNOSIS — C50812 Malignant neoplasm of overlapping sites of left female breast: Secondary | ICD-10-CM

## 2021-08-13 LAB — CBC WITH DIFFERENTIAL (CANCER CENTER ONLY)
Abs Immature Granulocytes: 0.01 10*3/uL (ref 0.00–0.07)
Basophils Absolute: 0 10*3/uL (ref 0.0–0.1)
Basophils Relative: 0 %
Eosinophils Absolute: 0.1 10*3/uL (ref 0.0–0.5)
Eosinophils Relative: 2 %
HCT: 30.4 % — ABNORMAL LOW (ref 36.0–46.0)
Hemoglobin: 9.6 g/dL — ABNORMAL LOW (ref 12.0–15.0)
Immature Granulocytes: 0 %
Lymphocytes Relative: 27 %
Lymphs Abs: 1.3 10*3/uL (ref 0.7–4.0)
MCH: 28.8 pg (ref 26.0–34.0)
MCHC: 31.6 g/dL (ref 30.0–36.0)
MCV: 91.3 fL (ref 80.0–100.0)
Monocytes Absolute: 0.7 10*3/uL (ref 0.1–1.0)
Monocytes Relative: 14 %
Neutro Abs: 2.8 10*3/uL (ref 1.7–7.7)
Neutrophils Relative %: 57 %
Platelet Count: 181 10*3/uL (ref 150–400)
RBC: 3.33 MIL/uL — ABNORMAL LOW (ref 3.87–5.11)
RDW: 14.6 % (ref 11.5–15.5)
WBC Count: 4.8 10*3/uL (ref 4.0–10.5)
nRBC: 0 % (ref 0.0–0.2)

## 2021-08-13 LAB — CMP (CANCER CENTER ONLY)
ALT: 14 U/L (ref 0–44)
AST: 20 U/L (ref 15–41)
Albumin: 3.4 g/dL — ABNORMAL LOW (ref 3.5–5.0)
Alkaline Phosphatase: 47 U/L (ref 38–126)
Anion gap: 11 (ref 5–15)
BUN: 37 mg/dL — ABNORMAL HIGH (ref 8–23)
CO2: 22 mmol/L (ref 22–32)
Calcium: 9.4 mg/dL (ref 8.9–10.3)
Chloride: 108 mmol/L (ref 98–111)
Creatinine: 1.02 mg/dL — ABNORMAL HIGH (ref 0.44–1.00)
GFR, Estimated: >60 mL/min
Glucose, Bld: 101 mg/dL — ABNORMAL HIGH (ref 70–99)
Potassium: 4 mmol/L (ref 3.5–5.1)
Sodium: 141 mmol/L (ref 135–145)
Total Bilirubin: 0.4 mg/dL (ref 0.3–1.2)
Total Protein: 8 g/dL (ref 6.5–8.1)

## 2021-08-13 MED ORDER — SODIUM CHLORIDE 0.9 % IV SOLN: Freq: Once | INTRAVENOUS | Status: AC

## 2021-08-13 MED ORDER — TRASTUZUMAB-ANNS CHEMO 150 MG IV SOLR
600.0000 mg | Freq: Once | INTRAVENOUS | Status: AC
  Administered 2021-08-13: 600 mg via INTRAVENOUS
  Filled 2021-08-13: qty 28.57

## 2021-08-13 MED ORDER — ACETAMINOPHEN 325 MG PO TABS
650.0000 mg | ORAL_TABLET | Freq: Once | ORAL | Status: AC
  Administered 2021-08-13: 650 mg via ORAL
  Filled 2021-08-13: qty 2

## 2021-08-13 MED ORDER — EPOETIN ALFA-EPBX 40000 UNIT/ML IJ SOLN
40000.0000 [IU] | Freq: Once | INTRAMUSCULAR | Status: AC
  Administered 2021-08-13: 40000 [IU] via SUBCUTANEOUS
  Filled 2021-08-13: qty 1

## 2021-08-13 MED ORDER — HEPARIN SOD (PORK) LOCK FLUSH 100 UNIT/ML IV SOLN
500.0000 [IU] | Freq: Once | INTRAVENOUS | Status: AC | PRN
  Administered 2021-08-13: 500 [IU]

## 2021-08-13 MED ORDER — DIPHENHYDRAMINE HCL 25 MG PO CAPS
25.0000 mg | ORAL_CAPSULE | Freq: Once | ORAL | Status: AC
  Administered 2021-08-13: 25 mg via ORAL
  Filled 2021-08-13: qty 1

## 2021-08-13 MED ORDER — SODIUM CHLORIDE 0.9% FLUSH
10.0000 mL | INTRAVENOUS | Status: DC | PRN
  Administered 2021-08-13: 10 mL

## 2021-08-13 MED ORDER — SODIUM CHLORIDE 0.9% FLUSH
10.0000 mL | Freq: Once | INTRAVENOUS | Status: AC
  Administered 2021-08-13: 10 mL

## 2021-08-13 NOTE — Patient Instructions (Signed)
[facility]  Discharge Instructions: Thank you for choosing [facility] to provide your oncology and hematology care.   If you have a lab appointment with the [facility], please go directly to the [facility] and check in at the registration area.   Wear comfortable clothing and clothing appropriate for easy access to any Portacath or PICC line.   We strive to give you quality time with your provider. You may need to reschedule your appointment if you arrive late (15 or more minutes).  Arriving late affects you and other patients whose appointments are after yours.  Also, if you miss three or more appointments without notifying the office, you may be dismissed from the clinic at the provider's discretion.      For prescription refill requests, have your pharmacy contact our office and allow 72 hours for refills to be completed.    Today you received the following chemotherapy and/or immunotherapy agents kanjiniti      To help prevent nausea and vomiting after your treatment, we encourage you to take your nausea medication as directed.  BELOW ARE SYMPTOMS THAT SHOULD BE REPORTED IMMEDIATELY: *FEVER GREATER THAN 100.4 F (38 C) OR HIGHER *CHILLS OR SWEATING *NAUSEA AND VOMITING THAT IS NOT CONTROLLED WITH YOUR NAUSEA MEDICATION *UNUSUAL SHORTNESS OF BREATH *UNUSUAL BRUISING OR BLEEDING *URINARY PROBLEMS (pain or burning when urinating, or frequent urination) *BOWEL PROBLEMS (unusual diarrhea, constipation, pain near the anus) TENDERNESS IN MOUTH AND THROAT WITH OR WITHOUT PRESENCE OF ULCERS (sore throat, sores in mouth, or a toothache) UNUSUAL RASH, SWELLING OR PAIN  UNUSUAL VAGINAL DISCHARGE OR ITCHING   Items with * indicate a potential emergency and should be followed up as soon as possible or go to the Emergency Department if any problems should occur.  Please show the CHEMOTHERAPY ALERT CARD or IMMUNOTHERAPY ALERT CARD at check-in to  the Emergency Department and triage nurse.  Should you have questions after your visit or need to cancel or reschedule your appointment, please contact [facility]  Dept: [phone]  and follow the prompts.  Office hours are 8:00 a.m. to 4:30 p.m. Monday - Friday. Please note that voicemails left after 4:00 p.m. may not be returned until the following business day.  We are closed weekends and major holidays. You have access to a nurse at all times for urgent questions. Please call the main number to the clinic Dept: [phone] and follow the prompts.   For any non-urgent questions, you may also contact your provider using MyChart. We now offer e-Visits for anyone 9 and older to request care online for non-urgent symptoms. For details visit [email or web address].   Also download the MyChart app! Go to the app store, search "MyChart", open the app, select [facility], and log in with your MyChart username and password.  Due to Covid, a mask is required upon entering the hospital/clinic. If you do not have a mask, one will be given to you upon arrival. For doctor visits, patients may have 1 support person aged 63 or older with them. For treatment visits, patients cannot have anyone with them due to current Covid guidelines and our immunocompromised population.

## 2021-08-13 NOTE — Progress Notes (Signed)
MD Magrinat would like to increase Kanjinti dose after weight increase.

## 2021-08-15 ENCOUNTER — Other Ambulatory Visit (HOSPITAL_COMMUNITY): Payer: Self-pay | Admitting: Cardiology

## 2021-08-20 ENCOUNTER — Ambulatory Visit (INDEPENDENT_AMBULATORY_CARE_PROVIDER_SITE_OTHER): Payer: 59 | Admitting: Pharmacist

## 2021-08-20 ENCOUNTER — Telehealth: Payer: Self-pay

## 2021-08-20 ENCOUNTER — Other Ambulatory Visit: Payer: Self-pay

## 2021-08-20 VITALS — Wt 218.0 lb

## 2021-08-20 DIAGNOSIS — E119 Type 2 diabetes mellitus without complications: Secondary | ICD-10-CM | POA: Diagnosis not present

## 2021-08-20 NOTE — Patient Instructions (Signed)
Good resources:  Eat. Sleep. Move. Breath: The Beginner's Guide to Living A Healthy Lifestyle  Eat, Drink, and Be Healthy: The [employer] Guide to [employer] (2007 version)  Zoe podcast     Building a Healthy and Balanced Diet Make most of your meal vegetables and fruits -  of your plate. Aim for color and variety, and remember that potatoes don't count as vegetables on the Healthy Eating Plate because of their negative impact on blood sugar.  Go for whole grains -  of your plate. Whole and intact grains--whole wheat, barley, wheat berries, quinoa, oats, brown rice, and foods made with them, such as whole wheat pasta--have a milder effect on blood sugar and insulin than white bread, white rice, and other refined grains.  Protein power -  of your plate. Fish, poultry, beans, and nuts are all healthy, versatile protein sources--they can be mixed into salads, and pair well with vegetables on a plate. Limit red meat, and avoid processed meats such as bacon and sausage.  Healthy plant oils - in moderation. Choose healthy vegetable oils like olive, canola, soy, corn, sunflower, peanut, and others, and avoid partially hydrogenated oils, which contain unhealthy trans fats. Remember that low-fat does not mean "healthy."  Drink water, coffee, or tea. Skip sugary drinks, limit milk and dairy products to one to two servings per day, and limit juice to a small glass per day.  Stay active. The red figure running across the [place] is a reminder that staying active is also important in weight control.  The main message of the Healthy Eating Plate is to focus on diet quality:  The type of carbohydrate in the diet is more important than the amount of carbohydrate in the diet, because some sources of carbohydrate--like vegetables (other than potatoes), fruits, whole grains, and beans--are healthier than others. The Healthy Eating Plate also advises consumers  to avoid sugary beverages, a major source of calories--usually with little nutritional value--in the American diet. The Healthy Eating Plate encourages consumers to use healthy oils, and it does not set a maximum on the percentage of calories people should get each day from healthy sources of fat. In this way, the Healthy Eating Plate recommends the opposite of the low-fat message promoted for decades by the USDA.  [email or web address]

## 2021-08-20 NOTE — Telephone Encounter (Signed)
Called to discuss PREP program, left voicemail ? ?
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
## 2021-08-21 ENCOUNTER — Telehealth: Payer: Self-pay

## 2021-08-21 ENCOUNTER — Telehealth: Payer: Self-pay | Admitting: Pharmacist

## 2021-08-21 LAB — HEMOGLOBIN A1C
Est. average glucose Bld gHb Est-mCnc: 143 mg/dL
Hgb A1c MFr Bld: 6.6 % — ABNORMAL HIGH (ref 4.8–5.6)

## 2021-08-21 NOTE — Telephone Encounter (Signed)
Calling pt with A1C results. A1c 6.6 PA for ozmepic was approved after visit yesterday. Will let patient know even though she had declined medication at apt yesterday. LVM for pt to call back

## 2021-08-21 NOTE — Telephone Encounter (Signed)
Pt returned call Explained program. Explained next offering of class nearest to her.  Has concerns about missing class due to medical appts and upcoming winter weather.  We decided to keep her on the list and recall her when starting new classes.

## 2021-08-21 NOTE — Telephone Encounter (Signed)
Reviewed results with patient. She will work on the dietary modifications that we discussed yesterday in office.

## 2021-08-21 NOTE — Telephone Encounter (Signed)
Pt returned call (VMT) reference PREP VMT requesting call back to discuss.

## 2021-08-29 ENCOUNTER — Telehealth: Payer: Self-pay

## 2021-08-29 NOTE — Telephone Encounter (Signed)
Call to pt reference starting PREP on Dec 5th Class will be M/W 230p-345pm Is interested in participating. Does have some medical appts on Wednesday but those are in the morning.  Initial assessment scheduled for Nov 28th at 9am.

## 2021-09-01 ENCOUNTER — Ambulatory Visit: Payer: 59

## 2021-09-03 ENCOUNTER — Inpatient Hospital Stay: Payer: 59

## 2021-09-03 ENCOUNTER — Other Ambulatory Visit: Payer: Self-pay

## 2021-09-03 ENCOUNTER — Inpatient Hospital Stay: Payer: 59 | Attending: Oncology

## 2021-09-03 VITALS — BP 145/93 | HR 65 | Temp 98.7°F | Resp 16 | Wt 215.8 lb

## 2021-09-03 DIAGNOSIS — C50812 Malignant neoplasm of overlapping sites of left female breast: Secondary | ICD-10-CM

## 2021-09-03 DIAGNOSIS — Z95828 Presence of other vascular implants and grafts: Secondary | ICD-10-CM

## 2021-09-03 DIAGNOSIS — Z5112 Encounter for antineoplastic immunotherapy: Secondary | ICD-10-CM | POA: Diagnosis present

## 2021-09-03 DIAGNOSIS — C773 Secondary and unspecified malignant neoplasm of axilla and upper limb lymph nodes: Secondary | ICD-10-CM | POA: Diagnosis present

## 2021-09-03 DIAGNOSIS — Z86718 Personal history of other venous thrombosis and embolism: Secondary | ICD-10-CM | POA: Diagnosis not present

## 2021-09-03 DIAGNOSIS — Z79899 Other long term (current) drug therapy: Secondary | ICD-10-CM | POA: Insufficient documentation

## 2021-09-03 DIAGNOSIS — Z170 Estrogen receptor positive status [ER+]: Secondary | ICD-10-CM | POA: Diagnosis not present

## 2021-09-03 DIAGNOSIS — C771 Secondary and unspecified malignant neoplasm of intrathoracic lymph nodes: Secondary | ICD-10-CM

## 2021-09-03 DIAGNOSIS — Z7901 Long term (current) use of anticoagulants: Secondary | ICD-10-CM | POA: Insufficient documentation

## 2021-09-03 DIAGNOSIS — Z171 Estrogen receptor negative status [ER-]: Secondary | ICD-10-CM

## 2021-09-03 DIAGNOSIS — Z923 Personal history of irradiation: Secondary | ICD-10-CM | POA: Diagnosis not present

## 2021-09-03 DIAGNOSIS — C50112 Malignant neoplasm of central portion of left female breast: Secondary | ICD-10-CM

## 2021-09-03 DIAGNOSIS — C50912 Malignant neoplasm of unspecified site of left female breast: Secondary | ICD-10-CM | POA: Insufficient documentation

## 2021-09-03 LAB — CMP (CANCER CENTER ONLY)
ALT: 15 U/L (ref 0–44)
AST: 20 U/L (ref 15–41)
Albumin: 3.7 g/dL (ref 3.5–5.0)
Alkaline Phosphatase: 52 U/L (ref 38–126)
Anion gap: 9 (ref 5–15)
BUN: 36 mg/dL — ABNORMAL HIGH (ref 8–23)
CO2: 26 mmol/L (ref 22–32)
Calcium: 9.7 mg/dL (ref 8.9–10.3)
Chloride: 105 mmol/L (ref 98–111)
Creatinine: 1.15 mg/dL — ABNORMAL HIGH (ref 0.44–1.00)
GFR, Estimated: 54 mL/min — ABNORMAL LOW (ref >60)
Glucose, Bld: 128 mg/dL — ABNORMAL HIGH (ref 70–99)
Potassium: 3.7 mmol/L (ref 3.5–5.1)
Sodium: 140 mmol/L (ref 135–145)
Total Bilirubin: 0.4 mg/dL (ref 0.3–1.2)
Total Protein: 8.4 g/dL — ABNORMAL HIGH (ref 6.5–8.1)

## 2021-09-03 LAB — CBC WITH DIFFERENTIAL (CANCER CENTER ONLY)
Abs Immature Granulocytes: 0.01 10*3/uL (ref 0.00–0.07)
Basophils Absolute: 0 10*3/uL (ref 0.0–0.1)
Basophils Relative: 1 %
Eosinophils Absolute: 0.1 10*3/uL (ref 0.0–0.5)
Eosinophils Relative: 2 %
HCT: 34.2 % — ABNORMAL LOW (ref 36.0–46.0)
Hemoglobin: 10.8 g/dL — ABNORMAL LOW (ref 12.0–15.0)
Immature Granulocytes: 0 %
Lymphocytes Relative: 28 %
Lymphs Abs: 1.2 10*3/uL (ref 0.7–4.0)
MCH: 28.8 pg (ref 26.0–34.0)
MCHC: 31.6 g/dL (ref 30.0–36.0)
MCV: 91.2 fL (ref 80.0–100.0)
Monocytes Absolute: 0.5 10*3/uL (ref 0.1–1.0)
Monocytes Relative: 12 %
Neutro Abs: 2.4 10*3/uL (ref 1.7–7.7)
Neutrophils Relative %: 57 %
Platelet Count: 185 10*3/uL (ref 150–400)
RBC: 3.75 MIL/uL — ABNORMAL LOW (ref 3.87–5.11)
RDW: 14.7 % (ref 11.5–15.5)
WBC Count: 4.2 10*3/uL (ref 4.0–10.5)
nRBC: 0 % (ref 0.0–0.2)

## 2021-09-03 MED ORDER — SODIUM CHLORIDE 0.9% FLUSH
10.0000 mL | Freq: Once | INTRAVENOUS | Status: AC
  Administered 2021-09-03: 10 mL

## 2021-09-03 MED ORDER — DIPHENHYDRAMINE HCL 25 MG PO CAPS
25.0000 mg | ORAL_CAPSULE | Freq: Once | ORAL | Status: AC
  Administered 2021-09-03: 25 mg via ORAL
  Filled 2021-09-03: qty 1

## 2021-09-03 MED ORDER — EPOETIN ALFA-EPBX 40000 UNIT/ML IJ SOLN: 40000.0000 [IU] | Freq: Once | INTRAMUSCULAR | Status: DC

## 2021-09-03 MED ORDER — SODIUM CHLORIDE 0.9 % IV SOLN: Freq: Once | INTRAVENOUS | Status: AC

## 2021-09-03 MED ORDER — SODIUM CHLORIDE 0.9% FLUSH
10.0000 mL | INTRAVENOUS | Status: DC | PRN
  Administered 2021-09-03: 10 mL

## 2021-09-03 MED ORDER — HEPARIN SOD (PORK) LOCK FLUSH 100 UNIT/ML IV SOLN
500.0000 [IU] | Freq: Once | INTRAVENOUS | Status: AC | PRN
  Administered 2021-09-03: 500 [IU]

## 2021-09-03 MED ORDER — TRASTUZUMAB-ANNS CHEMO 150 MG IV SOLR
600.0000 mg | Freq: Once | INTRAVENOUS | Status: AC
  Administered 2021-09-03: 600 mg via INTRAVENOUS
  Filled 2021-09-03: qty 28.57

## 2021-09-03 MED ORDER — ACETAMINOPHEN 325 MG PO TABS
650.0000 mg | ORAL_TABLET | Freq: Once | ORAL | Status: AC
  Administered 2021-09-03: 650 mg via ORAL
  Filled 2021-09-03: qty 2

## 2021-09-03 NOTE — Addendum Note (Signed)
Addended by: Henreitta Leber E on: 09/03/2021 08:55 AM   Modules accepted: Orders

## 2021-09-03 NOTE — Patient Instructions (Signed)

## 2021-09-03 NOTE — Patient Instructions (Signed)
[facility]  Discharge Instructions: Thank you for choosing [facility] to provide your oncology and hematology care.   If you have a lab appointment with the [facility], please go directly to the [facility] and check in at the registration area.   Wear comfortable clothing and clothing appropriate for easy access to any Portacath or PICC line.   We strive to give you quality time with your provider. You may need to reschedule your appointment if you arrive late (15 or more minutes).  Arriving late affects you and other patients whose appointments are after yours.  Also, if you miss three or more appointments without notifying the office, you may be dismissed from the clinic at the provider's discretion.      For prescription refill requests, have your pharmacy contact our office and allow 72 hours for refills to be completed.    Today you received the following chemotherapy and/or immunotherapy agents: [id number].      To help prevent nausea and vomiting after your treatment, we encourage you to take your nausea medication as directed.  BELOW ARE SYMPTOMS THAT SHOULD BE REPORTED IMMEDIATELY: *FEVER GREATER THAN 100.4 F (38 C) OR HIGHER *CHILLS OR SWEATING *NAUSEA AND VOMITING THAT IS NOT CONTROLLED WITH YOUR NAUSEA MEDICATION *UNUSUAL SHORTNESS OF BREATH *UNUSUAL BRUISING OR BLEEDING *URINARY PROBLEMS (pain or burning when urinating, or frequent urination) *BOWEL PROBLEMS (unusual diarrhea, constipation, pain near the anus) TENDERNESS IN MOUTH AND THROAT WITH OR WITHOUT PRESENCE OF ULCERS (sore throat, sores in mouth, or a toothache) UNUSUAL RASH, SWELLING OR PAIN  UNUSUAL VAGINAL DISCHARGE OR ITCHING   Items with * indicate a potential emergency and should be followed up as soon as possible or go to the Emergency Department if any problems should occur.  Please show the CHEMOTHERAPY ALERT CARD or IMMUNOTHERAPY ALERT CARD at  check-in to the Emergency Department and triage nurse.  Should you have questions after your visit or need to cancel or reschedule your appointment, please contact [facility]  Dept: [phone]  and follow the prompts.  Office hours are 8:00 a.m. to 4:30 p.m. Monday - Friday. Please note that voicemails left after 4:00 p.m. may not be returned until the following business day.  We are closed weekends and major holidays. You have access to a nurse at all times for urgent questions. Please call the main number to the clinic Dept: [phone] and follow the prompts.   For any non-urgent questions, you may also contact your provider using MyChart. We now offer e-Visits for anyone 62 and older to request care online for non-urgent symptoms. For details visit [email or web address].   Also download the MyChart app! Go to the app store, search "MyChart", open the app, select [facility], and log in with your MyChart username and password.  Due to Covid, a mask is required upon entering the hospital/clinic. If you do not have a mask, one will be given to you upon arrival. For doctor visits, patients may have 1 support person aged 20 or older with them. For treatment visits, patients cannot have anyone with them due to current Covid guidelines and our immunocompromised population.

## 2021-09-04 ENCOUNTER — Telehealth: Payer: Self-pay

## 2021-09-04 NOTE — Telephone Encounter (Signed)
Patient notified of need for updated signed Release of Information form from [facility]. Request received from [facility] to release records from 07/12/20 to Present. Spoke with Nia T. in HIM regarding request and was informed that a new form is needed to release records dated 09/11/20 to Present. Per the conversation with Nia T., the last date of service released to [facility] was 08/22/20 and correspondence has been sent from HIM to [facility] regarding need for newly signed form. Patient verbalized understanding and stated that she would call [facility] and get the Release of Information form to sign and forward to this office or bring the form into the office.

## 2021-09-08 ENCOUNTER — Telehealth: Payer: Self-pay

## 2021-09-08 NOTE — Telephone Encounter (Signed)
Spoke with patient regarding need for updated signed Release of Information Form in order to release medical records to [facility] for Disability claim. Patient stated that she had the form but had not had time to get to it. Provided Patient with [facility] E-mail address as well as fax number. Patient stated she would send it when she got a chance. No other needs verbalized at this time.

## 2021-09-10 ENCOUNTER — Telehealth: Payer: Self-pay | Admitting: *Deleted

## 2021-09-10 NOTE — Telephone Encounter (Signed)
Message left for [name] regarding [facility] third request for medical records.  Advised she sign a new [facility] release or [facility] release.  Authorization with fax signed 09/23/2020 expired 09/23/2020, date of signature.  Also provided [facility] (SW) H.I.M. number [phone] opt 1 for further assistance or clarification of requirements to release PHI.

## 2021-09-15 NOTE — Telephone Encounter (Signed)
[id number] FREDE [phone]).  On my way to drop off consent for the [facility] and [facility]A  consent to release.  Put this off too long, fax information to them.  Call me with any questions."  10:40 Two consents retrieved with morning rounds by this nurse include "[facility]' and "[facility]" ROI were both signed 09/15/2021.  Request to (SW) H.I.M. to process.
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
## 2021-09-23 ENCOUNTER — Other Ambulatory Visit: Payer: Self-pay

## 2021-09-23 DIAGNOSIS — Z171 Estrogen receptor negative status [ER-]: Secondary | ICD-10-CM

## 2021-09-24 ENCOUNTER — Inpatient Hospital Stay: Payer: 59

## 2021-09-24 ENCOUNTER — Other Ambulatory Visit: Payer: 59

## 2021-09-24 ENCOUNTER — Ambulatory Visit: Payer: 59

## 2021-09-24 ENCOUNTER — Encounter: Payer: Self-pay | Admitting: Adult Health

## 2021-09-24 ENCOUNTER — Ambulatory Visit: Payer: 59 | Admitting: Adult Health

## 2021-09-24 ENCOUNTER — Inpatient Hospital Stay (HOSPITAL_BASED_OUTPATIENT_CLINIC_OR_DEPARTMENT_OTHER): Payer: 59 | Admitting: Adult Health

## 2021-09-24 ENCOUNTER — Other Ambulatory Visit: Payer: Self-pay

## 2021-09-24 ENCOUNTER — Inpatient Hospital Stay: Payer: 59 | Attending: Oncology

## 2021-09-24 VITALS — BP 133/64 | HR 70 | Temp 97.9°F | Resp 18 | Ht 59.0 in | Wt 217.9 lb

## 2021-09-24 DIAGNOSIS — M21371 Foot drop, right foot: Secondary | ICD-10-CM | POA: Insufficient documentation

## 2021-09-24 DIAGNOSIS — Z5112 Encounter for antineoplastic immunotherapy: Secondary | ICD-10-CM | POA: Insufficient documentation

## 2021-09-24 DIAGNOSIS — Z95828 Presence of other vascular implants and grafts: Secondary | ICD-10-CM

## 2021-09-24 DIAGNOSIS — Z7901 Long term (current) use of anticoagulants: Secondary | ICD-10-CM | POA: Diagnosis not present

## 2021-09-24 DIAGNOSIS — Z79899 Other long term (current) drug therapy: Secondary | ICD-10-CM | POA: Diagnosis not present

## 2021-09-24 DIAGNOSIS — Z171 Estrogen receptor negative status [ER-]: Secondary | ICD-10-CM | POA: Diagnosis not present

## 2021-09-24 DIAGNOSIS — C50812 Malignant neoplasm of overlapping sites of left female breast: Secondary | ICD-10-CM

## 2021-09-24 DIAGNOSIS — C773 Secondary and unspecified malignant neoplasm of axilla and upper limb lymph nodes: Secondary | ICD-10-CM | POA: Diagnosis present

## 2021-09-24 DIAGNOSIS — Z90722 Acquired absence of ovaries, bilateral: Secondary | ICD-10-CM | POA: Insufficient documentation

## 2021-09-24 DIAGNOSIS — C50112 Malignant neoplasm of central portion of left female breast: Secondary | ICD-10-CM

## 2021-09-24 DIAGNOSIS — Z86718 Personal history of other venous thrombosis and embolism: Secondary | ICD-10-CM | POA: Insufficient documentation

## 2021-09-24 DIAGNOSIS — C541 Malignant neoplasm of endometrium: Secondary | ICD-10-CM

## 2021-09-24 DIAGNOSIS — Z923 Personal history of irradiation: Secondary | ICD-10-CM | POA: Diagnosis not present

## 2021-09-24 DIAGNOSIS — Z9079 Acquired absence of other genital organ(s): Secondary | ICD-10-CM | POA: Diagnosis not present

## 2021-09-24 DIAGNOSIS — Z90710 Acquired absence of both cervix and uterus: Secondary | ICD-10-CM | POA: Diagnosis not present

## 2021-09-24 DIAGNOSIS — G629 Polyneuropathy, unspecified: Secondary | ICD-10-CM | POA: Diagnosis not present

## 2021-09-24 DIAGNOSIS — Z8542 Personal history of malignant neoplasm of other parts of uterus: Secondary | ICD-10-CM | POA: Diagnosis not present

## 2021-09-24 DIAGNOSIS — C50912 Malignant neoplasm of unspecified site of left female breast: Secondary | ICD-10-CM | POA: Diagnosis present

## 2021-09-24 LAB — CBC WITH DIFFERENTIAL (CANCER CENTER ONLY)
Abs Immature Granulocytes: 0.01 10*3/uL (ref 0.00–0.07)
Basophils Absolute: 0 10*3/uL (ref 0.0–0.1)
Basophils Relative: 0 %
Eosinophils Absolute: 0.1 10*3/uL (ref 0.0–0.5)
Eosinophils Relative: 1 %
HCT: 32 % — ABNORMAL LOW (ref 36.0–46.0)
Hemoglobin: 10.2 g/dL — ABNORMAL LOW (ref 12.0–15.0)
Immature Granulocytes: 0 %
Lymphocytes Relative: 27 %
Lymphs Abs: 1.2 10*3/uL (ref 0.7–4.0)
MCH: 28.8 pg (ref 26.0–34.0)
MCHC: 31.9 g/dL (ref 30.0–36.0)
MCV: 90.4 fL (ref 80.0–100.0)
Monocytes Absolute: 0.6 10*3/uL (ref 0.1–1.0)
Monocytes Relative: 12 %
Neutro Abs: 2.6 10*3/uL (ref 1.7–7.7)
Neutrophils Relative %: 60 %
Platelet Count: 181 10*3/uL (ref 150–400)
RBC: 3.54 MIL/uL — ABNORMAL LOW (ref 3.87–5.11)
RDW: 14.1 % (ref 11.5–15.5)
WBC Count: 4.5 10*3/uL (ref 4.0–10.5)
nRBC: 0 % (ref 0.0–0.2)

## 2021-09-24 LAB — CMP (CANCER CENTER ONLY)
ALT: 16 U/L (ref 0–44)
AST: 22 U/L (ref 15–41)
Albumin: 3.5 g/dL (ref 3.5–5.0)
Alkaline Phosphatase: 52 U/L (ref 38–126)
Anion gap: 9 (ref 5–15)
BUN: 33 mg/dL — ABNORMAL HIGH (ref 8–23)
CO2: 24 mmol/L (ref 22–32)
Calcium: 9.3 mg/dL (ref 8.9–10.3)
Chloride: 107 mmol/L (ref 98–111)
Creatinine: 0.98 mg/dL (ref 0.44–1.00)
GFR, Estimated: >60 mL/min (ref >60)
Glucose, Bld: 115 mg/dL — ABNORMAL HIGH (ref 70–99)
Potassium: 3.6 mmol/L (ref 3.5–5.1)
Sodium: 140 mmol/L (ref 135–145)
Total Bilirubin: 0.5 mg/dL (ref 0.3–1.2)
Total Protein: 8 g/dL (ref 6.5–8.1)

## 2021-09-24 MED ORDER — SODIUM CHLORIDE 0.9% FLUSH
10.0000 mL | Freq: Once | INTRAVENOUS | Status: AC
  Administered 2021-09-24: 10 mL

## 2021-09-24 MED ORDER — HEPARIN SOD (PORK) LOCK FLUSH 100 UNIT/ML IV SOLN
500.0000 [IU] | Freq: Once | INTRAVENOUS | Status: AC | PRN
  Administered 2021-09-24: 500 [IU]

## 2021-09-24 MED ORDER — ACETAMINOPHEN 325 MG PO TABS
650.0000 mg | ORAL_TABLET | Freq: Once | ORAL | Status: AC
  Administered 2021-09-24: 650 mg via ORAL
  Filled 2021-09-24: qty 2

## 2021-09-24 MED ORDER — DIPHENHYDRAMINE HCL 25 MG PO CAPS
25.0000 mg | ORAL_CAPSULE | Freq: Once | ORAL | Status: AC
  Administered 2021-09-24: 25 mg via ORAL
  Filled 2021-09-24: qty 1

## 2021-09-24 MED ORDER — TRASTUZUMAB-ANNS CHEMO 150 MG IV SOLR
600.0000 mg | Freq: Once | INTRAVENOUS | Status: AC
  Administered 2021-09-24: 600 mg via INTRAVENOUS
  Filled 2021-09-24: qty 28.57

## 2021-09-24 MED ORDER — SODIUM CHLORIDE 0.9 % IV SOLN: Freq: Once | INTRAVENOUS | Status: AC

## 2021-09-24 MED ORDER — SODIUM CHLORIDE 0.9% FLUSH
10.0000 mL | INTRAVENOUS | Status: DC | PRN
  Administered 2021-09-24: 10 mL

## 2021-09-24 NOTE — Patient Instructions (Signed)
[facility]   ?Discharge Instructions: ?Thank you for choosing [facility] to provide your oncology and hematology care.  ? ?If you have a lab appointment with the Cancer Center, please go directly to the Cancer Center and check in at the registration area. ?  ?Wear comfortable clothing and clothing appropriate for easy access to any Portacath or PICC line.  ? ?We strive to give you quality time with your provider. You may need to reschedule your appointment if you arrive late (15 or more minutes).  Arriving late affects you and other patients whose appointments are after yours.  Also, if you miss three or more appointments without notifying the office, you may be dismissed from the clinic at the provider?s discretion.    ?  ?For prescription refill requests, have your pharmacy contact our office and allow 72 hours for refills to be completed.   ? ?Today you received the following chemotherapy and/or immunotherapy agents: [id number]-anns    ?  ?To help prevent nausea and vomiting after your treatment, we encourage you to take your nausea medication as directed. ? ?BELOW ARE SYMPTOMS THAT SHOULD BE REPORTED IMMEDIATELY: ?*FEVER GREATER THAN 100.4 F (38 ?C) OR HIGHER ?*CHILLS OR SWEATING ?*NAUSEA AND VOMITING THAT IS NOT CONTROLLED WITH YOUR NAUSEA MEDICATION ?*UNUSUAL SHORTNESS OF BREATH ?*UNUSUAL BRUISING OR BLEEDING ?*URINARY PROBLEMS (pain or burning when urinating, or frequent urination) ?*BOWEL PROBLEMS (unusual diarrhea, constipation, pain near the anus) ?TENDERNESS IN MOUTH AND THROAT WITH OR WITHOUT PRESENCE OF ULCERS (sore throat, sores in mouth, or a toothache) ?UNUSUAL RASH, SWELLING OR PAIN  ?UNUSUAL VAGINAL DISCHARGE OR ITCHING  ? ?Items with * indicate a potential emergency and should be followed up as soon as possible or go to the Emergency Department if any problems should occur. ? ?Please show the CHEMOTHERAPY ALERT CARD or IMMUNOTHERAPY ALERT CARD at  check-in to the Emergency Department and triage nurse. ? ?Should you have questions after your visit or need to cancel or reschedule your appointment, please contact [facility]  Dept: [phone]  and follow the prompts.  Office hours are 8:00 a.m. to 4:30 p.m. Monday - Friday. Please note that voicemails left after 4:00 p.m. may not be returned until the following business day.  We are closed weekends and major holidays. You have access to a nurse at all times for urgent questions. Please call the main number to the clinic Dept: [phone] and follow the prompts. ? ? ?For any non-urgent questions, you may also contact your provider using MyChart. We now offer e-Visits for anyone 18 and older to request care online for non-urgent symptoms. For details visit [email or web address]. ?  ?Also download the MyChart app! Go to the app store, search "MyChart", open the app, select [facility], and log in with your MyChart username and password. ? ?Due to Covid, a mask is required upon entering the hospital/clinic. If you do not have a mask, one will be given to you upon arrival. For doctor visits, patients may have 1 support person aged 18 or older with them. For treatment visits, patients cannot have anyone with them due to current Covid guidelines and our immunocompromised population.  ? ?

## 2021-09-24 NOTE — Progress Notes (Addendum)
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 [place] [address] Tel. [phone]    Fax. [phone]     *Total Encounter Time as defined by the Centers for Medicare and Medicaid Services includes, in addition to the face-to-face time of a patient visit (documented in the note above) non-face-to-face time: obtaining and reviewing outside history, ordering and reviewing medications, tests or procedures, care coordination (communications with other health care professionals or caregivers) and documentation in the medical record.

## 2021-09-25 ENCOUNTER — Encounter: Payer: Self-pay | Admitting: Oncology

## 2021-09-29 NOTE — Progress Notes (Signed)
[facility] PREP Weekly Session  Patient Details  Name: [name] MRN: [id number] Date of Birth: November 11, 1955 Age: 65 y.o. PCP: Biagio Borg, MD  Vitals:   09/29/21 1616  Weight: 224 lb (101.6 kg)     [facility] Weekly seesion - 09/29/21 1600       [facility] "PREP" Location   [facility] "PREP" [facility]      Weekly Session   Topic Discussed Importance of resistance training;Other ways to be active    Minutes exercised this week 10 minutes    Classes attended to date 2             Barnett Hatter 09/29/2021, 4:17 PM

## 2021-10-01 ENCOUNTER — Telehealth: Payer: Self-pay | Admitting: *Deleted

## 2021-10-01 NOTE — Telephone Encounter (Signed)
Message received from [employer] [facility], Paula Libra [phone]).  "Please return call.  [facility] has made a number of request for medical records not yet received for [name] who recently signed a new release."      Message left to voicemail identified as Brita Romp to contact [place] (SW). Health Information Management (H.I.M.) [phone] opt 1 for status check of medical record release.  This office [facility]) unable to examine their work.  SW H.I.M. report thirty day processing time from receipt of signed HIPAA approved release.  H.I.M. staff will speak with patient or claim benefit manager.  Faxed request on your letterhead to system legal name '[place]' are also accepted.  SW H.I.M. Fax number is [phone].

## 2021-10-02 ENCOUNTER — Telehealth: Payer: Self-pay | Admitting: *Deleted

## 2021-10-02 NOTE — Telephone Encounter (Signed)
Message left for [facility] benefits claim manager Paula Libra [phone]) on behalf of [employer] requesting extension.   "[facility] advised me LORIAN YAUN) to ask what to do as they keep receiving the same information. Today's return call from (SW) H.I.M. I learned they are unable to release records.  The [facility] form I signed is correct.  The [place] release form is incorrect because it reads release records from [place].  They said they cannot separate and use only the [facility] request.  [place] registration desk nor you told me form was incorrect; to change to '[place]'.  How am I to know what to write on the form.  I did extra measure writing Dr. Jana Hakim to make sure correct information sent.  I have until December 22 or case will be cancelled."   Message left includes information this nurse has received per (SW) H.I.M. office who process insurance and legal record request for all [facility] provider offices and [facility] campus system.   H.I.P.A.A. approved authorization is required. Thirty calendar days to process begins upon receipt of authorization.  Request are forwarded to (SW) H.I.M. office location this office has no access to their work.    Return call to this nurse with questions.

## 2021-10-03 NOTE — Telephone Encounter (Signed)
Confirmed receipt of ROI left late yesterday per [name] voicemail. Advised this nurse faxed the newly signed ROI to (SW) H.I.M.  Hard copy to bin for delivery through department mail.  Both stamped urgent with October 09, 2021 deadline.  "[facility] has extended date to October 18, 2021.  Service date should be backdated to Date of the [facility] release they said is acceptable.  Yesterday I never received return call from H.I.M. office."  Having been told service date begins upon receipt of H.I.P.A.A approved release, I am unable to confirm thirty day process begins date of "acceptable" [facility] ROI signed on 09/15/2021 or today's corrected [place] release dated 10/02/2021. Confirmed (SW) H.I.M. [phone] opt 1 for status checks if needed.  Currently no further questions or needs.

## 2021-10-06 ENCOUNTER — Telehealth: Payer: Self-pay | Admitting: Hematology and Oncology

## 2021-10-06 ENCOUNTER — Telehealth: Payer: Self-pay | Admitting: Neurology

## 2021-10-06 DIAGNOSIS — G959 Disease of spinal cord, unspecified: Secondary | ICD-10-CM

## 2021-10-06 NOTE — Telephone Encounter (Signed)
Does she have specific neurosurgeon that she would like to see? We can send the referral for 2nd opinion for cervical myelopathy.  She would need to take images on CD to her appointment.

## 2021-10-06 NOTE — Telephone Encounter (Signed)
Per sch msg, patient needs to r/s an appt. Called and left msg for patient to call back to r/s if still needed

## 2021-10-06 NOTE — Telephone Encounter (Signed)
Patient wants to get a referral to [facility] for a neurosurgeon. She wants a call to discuss what needs to be sent.  [place] fax number [phone]

## 2021-10-07 NOTE — Telephone Encounter (Signed)
Called and spoke to patient and informed her that Dr. Posey Pronto is ok with Sending a referral for 2nd opinion to [facility]. Patient is aware I will get her referral sent out today. Patient had no further questions or concerns.

## 2021-10-07 NOTE — Progress Notes (Signed)
[facility] PREP Weekly Session  Patient Details  Name: [name] MRN: [id number] Date of Birth: 12-09-1955 Age: 65 y.o. PCP: Biagio Borg, MD  Vitals:   10/06/21 1430  Weight: 223 lb (101.2 kg)     [facility] Weekly seesion - 10/07/21 1600       [facility] "PREP" Location   [facility]      Weekly Session   Topic Discussed Healthy eating tips    Minutes exercised this week 30 minutes    Classes attended to date [place] 10/07/2021, 4:55 PM

## 2021-10-15 ENCOUNTER — Other Ambulatory Visit: Payer: 59

## 2021-10-15 ENCOUNTER — Ambulatory Visit: Payer: 59

## 2021-10-21 NOTE — Progress Notes (Signed)
[facility] PREP Weekly Session  Patient Details  Name: CHARITIE HINOTE MRN: [id number] Date of Birth: December 03, 1955 Age: 66 y.o. PCP: Biagio Borg, MD  There were no vitals filed for this visit.   [facility] Weekly seesion - 10/21/21 1600       [facility] "PREP" Location   [facility] "PREP" Location [facility]      Weekly Session   Topic Discussed Healthy eating tips   part 2, sugar demo   Minutes exercised this week 120 minutes    Classes attended to date 6           Class held on 10/20/21  Barnett Hatter 10/21/2021, 4:46 PM

## 2021-10-22 ENCOUNTER — Other Ambulatory Visit: Payer: Self-pay | Admitting: *Deleted

## 2021-10-22 ENCOUNTER — Other Ambulatory Visit: Payer: Self-pay

## 2021-10-22 DIAGNOSIS — Z171 Estrogen receptor negative status [ER-]: Secondary | ICD-10-CM

## 2021-10-22 DIAGNOSIS — C50812 Malignant neoplasm of overlapping sites of left female breast: Secondary | ICD-10-CM

## 2021-10-23 ENCOUNTER — Inpatient Hospital Stay: Payer: 59

## 2021-10-23 ENCOUNTER — Other Ambulatory Visit: Payer: Self-pay

## 2021-10-23 ENCOUNTER — Inpatient Hospital Stay: Payer: 59 | Attending: Hematology and Oncology

## 2021-10-23 VITALS — BP 144/74 | HR 57 | Temp 98.0°F | Resp 18 | Wt 223.0 lb

## 2021-10-23 DIAGNOSIS — C50112 Malignant neoplasm of central portion of left female breast: Secondary | ICD-10-CM

## 2021-10-23 DIAGNOSIS — Z95828 Presence of other vascular implants and grafts: Secondary | ICD-10-CM

## 2021-10-23 DIAGNOSIS — C50812 Malignant neoplasm of overlapping sites of left female breast: Secondary | ICD-10-CM

## 2021-10-23 DIAGNOSIS — D6481 Anemia due to antineoplastic chemotherapy: Secondary | ICD-10-CM | POA: Insufficient documentation

## 2021-10-23 DIAGNOSIS — Z79899 Other long term (current) drug therapy: Secondary | ICD-10-CM | POA: Insufficient documentation

## 2021-10-23 DIAGNOSIS — Z7901 Long term (current) use of anticoagulants: Secondary | ICD-10-CM | POA: Diagnosis not present

## 2021-10-23 DIAGNOSIS — T451X5D Adverse effect of antineoplastic and immunosuppressive drugs, subsequent encounter: Secondary | ICD-10-CM | POA: Insufficient documentation

## 2021-10-23 DIAGNOSIS — C50912 Malignant neoplasm of unspecified site of left female breast: Secondary | ICD-10-CM | POA: Insufficient documentation

## 2021-10-23 DIAGNOSIS — Z923 Personal history of irradiation: Secondary | ICD-10-CM | POA: Insufficient documentation

## 2021-10-23 DIAGNOSIS — Z9221 Personal history of antineoplastic chemotherapy: Secondary | ICD-10-CM | POA: Diagnosis not present

## 2021-10-23 DIAGNOSIS — Z171 Estrogen receptor negative status [ER-]: Secondary | ICD-10-CM

## 2021-10-23 DIAGNOSIS — Z170 Estrogen receptor positive status [ER+]: Secondary | ICD-10-CM | POA: Diagnosis not present

## 2021-10-23 DIAGNOSIS — Z5112 Encounter for antineoplastic immunotherapy: Secondary | ICD-10-CM | POA: Insufficient documentation

## 2021-10-23 DIAGNOSIS — Z86718 Personal history of other venous thrombosis and embolism: Secondary | ICD-10-CM | POA: Insufficient documentation

## 2021-10-23 DIAGNOSIS — C773 Secondary and unspecified malignant neoplasm of axilla and upper limb lymph nodes: Secondary | ICD-10-CM | POA: Insufficient documentation

## 2021-10-23 LAB — CBC WITH DIFFERENTIAL (CANCER CENTER ONLY)
Abs Immature Granulocytes: 0.01 10*3/uL (ref 0.00–0.07)
Basophils Absolute: 0 10*3/uL (ref 0.0–0.1)
Basophils Relative: 0 %
Eosinophils Absolute: 0.1 10*3/uL (ref 0.0–0.5)
Eosinophils Relative: 1 %
HCT: 30.8 % — ABNORMAL LOW (ref 36.0–46.0)
Hemoglobin: 9.8 g/dL — ABNORMAL LOW (ref 12.0–15.0)
Immature Granulocytes: 0 %
Lymphocytes Relative: 26 %
Lymphs Abs: 1.3 10*3/uL (ref 0.7–4.0)
MCH: 28.9 pg (ref 26.0–34.0)
MCHC: 31.8 g/dL (ref 30.0–36.0)
MCV: 90.9 fL (ref 80.0–100.0)
Monocytes Absolute: 0.6 10*3/uL (ref 0.1–1.0)
Monocytes Relative: 13 %
Neutro Abs: 2.9 10*3/uL (ref 1.7–7.7)
Neutrophils Relative %: 60 %
Platelet Count: 186 10*3/uL (ref 150–400)
RBC: 3.39 MIL/uL — ABNORMAL LOW (ref 3.87–5.11)
RDW: 14.1 % (ref 11.5–15.5)
WBC Count: 4.9 10*3/uL (ref 4.0–10.5)
nRBC: 0 % (ref 0.0–0.2)

## 2021-10-23 LAB — CMP (CANCER CENTER ONLY)
ALT: 14 U/L (ref 0–44)
AST: 17 U/L (ref 15–41)
Albumin: 3.7 g/dL (ref 3.5–5.0)
Alkaline Phosphatase: 44 U/L (ref 38–126)
Anion gap: 6 (ref 5–15)
BUN: 38 mg/dL — ABNORMAL HIGH (ref 8–23)
CO2: 27 mmol/L (ref 22–32)
Calcium: 8.9 mg/dL (ref 8.9–10.3)
Chloride: 107 mmol/L (ref 98–111)
Creatinine: 1.15 mg/dL — ABNORMAL HIGH (ref 0.44–1.00)
GFR, Estimated: 54 mL/min — ABNORMAL LOW (ref >60)
Glucose, Bld: 119 mg/dL — ABNORMAL HIGH (ref 70–99)
Potassium: 3.8 mmol/L (ref 3.5–5.1)
Sodium: 140 mmol/L (ref 135–145)
Total Bilirubin: 0.3 mg/dL (ref 0.3–1.2)
Total Protein: 7.8 g/dL (ref 6.5–8.1)

## 2021-10-23 MED ORDER — ACETAMINOPHEN 325 MG PO TABS
650.0000 mg | ORAL_TABLET | Freq: Once | ORAL | Status: AC
  Administered 2021-10-23: 650 mg via ORAL
  Filled 2021-10-23: qty 2

## 2021-10-23 MED ORDER — HEPARIN SOD (PORK) LOCK FLUSH 100 UNIT/ML IV SOLN
500.0000 [IU] | Freq: Once | INTRAVENOUS | Status: AC | PRN
  Administered 2021-10-23: 500 [IU]

## 2021-10-23 MED ORDER — DIPHENHYDRAMINE HCL 25 MG PO CAPS
25.0000 mg | ORAL_CAPSULE | Freq: Once | ORAL | Status: AC
  Administered 2021-10-23: 25 mg via ORAL
  Filled 2021-10-23: qty 1

## 2021-10-23 MED ORDER — SODIUM CHLORIDE 0.9% FLUSH
10.0000 mL | INTRAVENOUS | Status: DC | PRN
  Administered 2021-10-23: 10 mL

## 2021-10-23 MED ORDER — SODIUM CHLORIDE 0.9% FLUSH
10.0000 mL | Freq: Once | INTRAVENOUS | Status: AC
  Administered 2021-10-23: 10 mL

## 2021-10-23 MED ORDER — EPOETIN ALFA-EPBX 40000 UNIT/ML IJ SOLN
40000.0000 [IU] | Freq: Once | INTRAMUSCULAR | Status: AC
  Administered 2021-10-23: 40000 [IU] via SUBCUTANEOUS
  Filled 2021-10-23: qty 1

## 2021-10-23 MED ORDER — EPOETIN ALFA-EPBX 40000 UNIT/ML IJ SOLN: 40000.0000 [IU] | Freq: Once | INTRAMUSCULAR | Status: DC

## 2021-10-23 MED ORDER — SODIUM CHLORIDE 0.9 % IV SOLN: Freq: Once | INTRAVENOUS | Status: AC

## 2021-10-23 MED ORDER — TRASTUZUMAB-ANNS CHEMO 150 MG IV SOLR
600.0000 mg | Freq: Once | INTRAVENOUS | Status: AC
  Administered 2021-10-23: 600 mg via INTRAVENOUS
  Filled 2021-10-23: qty 28.57

## 2021-10-23 NOTE — Patient Instructions (Signed)
[facility]COLOG[facility]   ?Discharge Instructions: ?Thank you for choosing [facility] to provide your oncology and hematology care.  ? ?If you have a lab appointment with the Cancer Center, please go directly to the Cancer Center and check in at the registration area. ?  ?Wear comfortable clothing and clothing appropriate for easy access to any Portacath or PICC line.  ? ?We strive to give you quality time with your provider. You may need to reschedule your appointment if you arrive late (15 or more minutes).  Arriving late affects you and other patients whose appointments are after yours.  Also, if you miss three or more appointments without notifying the office, you may be dismissed from the clinic at the provider?s discretion.    ?  ?For prescription refill requests, have your pharmacy contact our office and allow 72 hours for refills to be completed.   ? ?Today you received the following chemotherapy and/or immunotherapy agents: trastuzumab-anns    ?  ?To help prevent nausea and vomiting after your treatment, we encourage you to take your nausea medication as directed. ? ?BELOW ARE SYMPTOMS THAT SHOULD BE REPORTED IMMEDIATELY: ?*FEVER GREATER THAN 100.4 F (38 ?C) OR HIGHER ?*CHILLS OR SWEATING ?*NAUSEA AND VOMITING THAT IS NOT CONTROLLED WITH YOUR NAUSEA MEDICATION ?*UNUSUAL SHORTNESS OF BREATH ?*UNUSUAL BRUISING OR BLEEDING ?*URINARY PROBLEMS (pain or burning when urinating, or frequent urination) ?*BOWEL PROBLEMS (unusual diarrhea, constipation, pain near the anus) ?TENDERNESS IN MOUTH AND THROAT WITH OR WITHOUT PRESENCE OF ULCERS (sore throat, sores in mouth, or a toothache) ?UNUSUAL RASH, SWELLING OR PAIN  ?UNUSUAL VAGINAL DISCHARGE OR ITCHING  ? ?Items with * indicate a potential emergency and should be followed up as soon as possible or go to the Emergency Department if any problems should occur. ? ?Please show the CHEMOTHERAPY ALERT CARD or IMMUNOTHERAPY ALERT CARD at  check-in to the Emergency Department and triage nurse. ? ?Should you have questions after your visit or need to cancel or reschedule your appointment, please contact [facility]COL[facility]  Dept: [phone]  and follow the prompts.  Office hours are 8:00 a.m. to 4:30 p.m. Monday - Friday. Please note that voicemails left after 4:00 p.m. may not be returned until the following business day.  We are closed weekends and major holidays. You have access to a nurse at all times for urgent questions. Please call the main number to the clinic Dept: [phone] and follow the prompts. ? ? ?For any non-urgent questions, you may also contact your provider using MyChart. We now offer e-Visits for anyone 18 and older to request care online for non-urgent symptoms. For details visit [email or web address]. ?  ?Also download the MyChart app! Go to the app store, search "MyChart", open the app, select [facility], and log in with your MyChart username and password. ? ?Due to Covid, a mask is required upon entering the hospital/clinic. If you do not have a mask, one will be given to you upon arrival. For doctor visits, patients may have 1 support person aged 18 or older with them. For treatment visits, patients cannot have anyone with them due to current Covid guidelines and our immunocompromised population.  ? ?

## 2021-10-28 ENCOUNTER — Other Ambulatory Visit (HOSPITAL_COMMUNITY): Payer: 59

## 2021-10-28 NOTE — Progress Notes (Signed)
[facility] PREP Weekly Session  Patient Details  Name: [name] MRN: [id number] Date of Birth: [birth year] Age: 66 y.o. PCP: Biagio Borg, MD  Vitals:   10/27/21 1430  Weight: 219 lb (99.3 kg)     [facility] Weekly seesion - 10/28/21 1700       [facility] "PREP" Location   [facility]      Weekly Session   Topic Discussed Health habits    Minutes exercised this week 35 minutes    Classes attended to date 8            Class held on 10/27/21 Barnett Hatter 10/28/2021, 5:36 PM

## 2021-11-03 ENCOUNTER — Other Ambulatory Visit: Payer: Self-pay

## 2021-11-03 ENCOUNTER — Ambulatory Visit (HOSPITAL_COMMUNITY)
Admission: RE | Admit: 2021-11-03 | Discharge: 2021-11-03 | Disposition: A | Discharge status: Home / Self Care | Payer: 59 | Source: Ambulatory Visit | Attending: Oncology | Admitting: Oncology

## 2021-11-03 DIAGNOSIS — Z171 Estrogen receptor negative status [ER-]: Secondary | ICD-10-CM | POA: Insufficient documentation

## 2021-11-03 DIAGNOSIS — Z01818 Encounter for other preprocedural examination: Secondary | ICD-10-CM | POA: Diagnosis present

## 2021-11-03 DIAGNOSIS — Z0189 Encounter for other specified special examinations: Secondary | ICD-10-CM | POA: Diagnosis not present

## 2021-11-03 DIAGNOSIS — C50112 Malignant neoplasm of central portion of left female breast: Secondary | ICD-10-CM | POA: Insufficient documentation

## 2021-11-03 DIAGNOSIS — E785 Hyperlipidemia, unspecified: Secondary | ICD-10-CM | POA: Insufficient documentation

## 2021-11-03 DIAGNOSIS — I110 Hypertensive heart disease with heart failure: Secondary | ICD-10-CM | POA: Diagnosis not present

## 2021-11-03 DIAGNOSIS — I509 Heart failure, unspecified: Secondary | ICD-10-CM | POA: Diagnosis not present

## 2021-11-03 DIAGNOSIS — I340 Nonrheumatic mitral (valve) insufficiency: Secondary | ICD-10-CM | POA: Diagnosis not present

## 2021-11-03 LAB — ECHOCARDIOGRAM COMPLETE
Area-P 1/2: 3.6 cm2
S' Lateral: 3.3 cm

## 2021-11-05 ENCOUNTER — Ambulatory Visit: Payer: 59

## 2021-11-05 ENCOUNTER — Other Ambulatory Visit: Payer: 59

## 2021-11-05 ENCOUNTER — Ambulatory Visit: Payer: 59 | Admitting: Hematology and Oncology

## 2021-11-05 NOTE — Progress Notes (Signed)
[facility] PREP Weekly Session  Patient Details  Name: [name] MRN: [id number] Date of Birth: Jan 17, 1956 Age: 66 y.o. PCP: Biagio Borg, MD  Vitals:   11/03/21 1430  Weight: 225 lb (102.1 kg)     [facility] Weekly seesion - 11/05/21 1100       [facility] "PREP" Location   [facility]      Weekly Session   Topic Discussed Restaurant Eating   salt demo   Minutes exercised this week 20 minutes    Classes attended to date [place] 11/05/2021, 11:13 AM

## 2021-11-12 ENCOUNTER — Other Ambulatory Visit: Payer: Self-pay | Admitting: *Deleted

## 2021-11-12 DIAGNOSIS — C50112 Malignant neoplasm of central portion of left female breast: Secondary | ICD-10-CM

## 2021-11-12 NOTE — Progress Notes (Signed)
[facility] PREP Weekly Session  Patient Details  Name: [name] MRN: [id number] Date of Birth: [birth year] Age: 66 y.o. PCP: Biagio Borg, MD  Vitals:   11/10/21 1430  Weight: 225 lb (102.1 kg)     [facility] Weekly seesion - 11/12/21 1200       [facility] "PREP" Location   [facility]      Weekly Session   Topic Discussed Stress management and problem solving   meditation   Minutes exercised this week 105 minutes    Classes attended to date 12           Class held on 11/10/21 Late entry  Barnett Hatter 11/12/2021, 12:16 PM

## 2021-11-13 ENCOUNTER — Inpatient Hospital Stay: Payer: 59

## 2021-11-13 ENCOUNTER — Inpatient Hospital Stay: Payer: 59 | Admitting: Hematology and Oncology

## 2021-11-13 ENCOUNTER — Other Ambulatory Visit: Payer: Self-pay

## 2021-11-13 ENCOUNTER — Encounter: Payer: Self-pay | Admitting: Hematology and Oncology

## 2021-11-13 VITALS — BP 145/72 | HR 62 | Temp 97.4°F | Resp 18 | Ht 59.0 in | Wt 226.0 lb

## 2021-11-13 DIAGNOSIS — C50112 Malignant neoplasm of central portion of left female breast: Secondary | ICD-10-CM

## 2021-11-13 DIAGNOSIS — C50812 Malignant neoplasm of overlapping sites of left female breast: Secondary | ICD-10-CM

## 2021-11-13 DIAGNOSIS — Z5112 Encounter for antineoplastic immunotherapy: Secondary | ICD-10-CM | POA: Diagnosis not present

## 2021-11-13 DIAGNOSIS — Z171 Estrogen receptor negative status [ER-]: Secondary | ICD-10-CM

## 2021-11-13 DIAGNOSIS — Z95828 Presence of other vascular implants and grafts: Secondary | ICD-10-CM

## 2021-11-13 LAB — CMP (CANCER CENTER ONLY)
ALT: 16 U/L (ref 0–44)
AST: 22 U/L (ref 15–41)
Albumin: 3.7 g/dL (ref 3.5–5.0)
Alkaline Phosphatase: 49 U/L (ref 38–126)
Anion gap: 6 (ref 5–15)
BUN: 28 mg/dL — ABNORMAL HIGH (ref 8–23)
CO2: 29 mmol/L (ref 22–32)
Calcium: 9.6 mg/dL (ref 8.9–10.3)
Chloride: 105 mmol/L (ref 98–111)
Creatinine: 0.91 mg/dL (ref 0.44–1.00)
GFR, Estimated: >60 mL/min (ref >60)
Glucose, Bld: 98 mg/dL (ref 70–99)
Potassium: 3.7 mmol/L (ref 3.5–5.1)
Sodium: 140 mmol/L (ref 135–145)
Total Bilirubin: 0.4 mg/dL (ref 0.3–1.2)
Total Protein: 7.8 g/dL (ref 6.5–8.1)

## 2021-11-13 LAB — CBC WITH DIFFERENTIAL (CANCER CENTER ONLY)
Abs Immature Granulocytes: 0.01 10*3/uL (ref 0.00–0.07)
Basophils Absolute: 0 10*3/uL (ref 0.0–0.1)
Basophils Relative: 0 %
Eosinophils Absolute: 0.1 10*3/uL (ref 0.0–0.5)
Eosinophils Relative: 2 %
HCT: 31.2 % — ABNORMAL LOW (ref 36.0–46.0)
Hemoglobin: 10.1 g/dL — ABNORMAL LOW (ref 12.0–15.0)
Immature Granulocytes: 0 %
Lymphocytes Relative: 27 %
Lymphs Abs: 1.2 10*3/uL (ref 0.7–4.0)
MCH: 29.1 pg (ref 26.0–34.0)
MCHC: 32.4 g/dL (ref 30.0–36.0)
MCV: 89.9 fL (ref 80.0–100.0)
Monocytes Absolute: 0.5 10*3/uL (ref 0.1–1.0)
Monocytes Relative: 12 %
Neutro Abs: 2.6 10*3/uL (ref 1.7–7.7)
Neutrophils Relative %: 59 %
Platelet Count: 179 10*3/uL (ref 150–400)
RBC: 3.47 MIL/uL — ABNORMAL LOW (ref 3.87–5.11)
RDW: 14.2 % (ref 11.5–15.5)
WBC Count: 4.5 10*3/uL (ref 4.0–10.5)
nRBC: 0 % (ref 0.0–0.2)

## 2021-11-13 MED ORDER — SODIUM CHLORIDE 0.9% FLUSH
10.0000 mL | Freq: Once | INTRAVENOUS | Status: AC
  Administered 2021-11-13: 10 mL

## 2021-11-13 MED ORDER — DIPHENHYDRAMINE HCL 25 MG PO CAPS
25.0000 mg | ORAL_CAPSULE | Freq: Once | ORAL | Status: AC
  Administered 2021-11-13: 25 mg via ORAL
  Filled 2021-11-13: qty 1

## 2021-11-13 MED ORDER — TRASTUZUMAB-ANNS CHEMO 150 MG IV SOLR
600.0000 mg | Freq: Once | INTRAVENOUS | Status: AC
  Administered 2021-11-13: 600 mg via INTRAVENOUS
  Filled 2021-11-13: qty 28.57

## 2021-11-13 MED ORDER — HEPARIN SOD (PORK) LOCK FLUSH 100 UNIT/ML IV SOLN
500.0000 [IU] | Freq: Once | INTRAVENOUS | Status: AC | PRN
  Administered 2021-11-13: 500 [IU]

## 2021-11-13 MED ORDER — ACETAMINOPHEN 325 MG PO TABS
650.0000 mg | ORAL_TABLET | Freq: Once | ORAL | Status: AC
  Administered 2021-11-13: 650 mg via ORAL
  Filled 2021-11-13: qty 2

## 2021-11-13 MED ORDER — SODIUM CHLORIDE 0.9 % IV SOLN: Freq: Once | INTRAVENOUS | Status: DC

## 2021-11-13 MED ORDER — SODIUM CHLORIDE 0.9% FLUSH
10.0000 mL | INTRAVENOUS | Status: DC | PRN
  Administered 2021-11-13: 10 mL

## 2021-11-13 NOTE — Patient Instructions (Addendum)
[facility] ONCOLOGY  Discharge Instructions: Thank you for choosing [facility] to provide your oncology and hematology care.   If you have a lab appointment with the [facility], please go directly to the [facility] and check in at the registration area.   Wear comfortable clothing and clothing appropriate for easy access to any Portacath or PICC line.   We strive to give you quality time with your provider. You may need to reschedule your appointment if you arrive late (15 or more minutes).  Arriving late affects you and other patients whose appointments are after yours.  Also, if you miss three or more appointments without notifying the office, you may be dismissed from the clinic at the providers discretion.      For prescription refill requests, have your pharmacy contact our office and allow 72 hours for refills to be completed.    Today you received the following chemotherapy and/or immunotherapy agents: Trastuzamab      To help prevent nausea and vomiting after your treatment, we encourage you to take your nausea medication as directed.  BELOW ARE SYMPTOMS THAT SHOULD BE REPORTED IMMEDIATELY: *FEVER GREATER THAN 100.4 F (38 C) OR HIGHER *CHILLS OR SWEATING *NAUSEA AND VOMITING THAT IS NOT CONTROLLED WITH YOUR NAUSEA MEDICATION *UNUSUAL SHORTNESS OF BREATH *UNUSUAL BRUISING OR BLEEDING *URINARY PROBLEMS (pain or burning when urinating, or frequent urination) *BOWEL PROBLEMS (unusual diarrhea, constipation, pain near the anus) TENDERNESS IN MOUTH AND THROAT WITH OR WITHOUT PRESENCE OF ULCERS (sore throat, sores in mouth, or a toothache) UNUSUAL RASH, SWELLING OR PAIN  UNUSUAL VAGINAL DISCHARGE OR ITCHING   Items with * indicate a potential emergency and should be followed up as soon as possible or go to the Emergency Department if any problems should occur.  Please show the CHEMOTHERAPY ALERT CARD or IMMUNOTHERAPY ALERT CARD at check-in  to the Emergency Department and triage nurse.  Should you have questions after your visit or need to cancel or reschedule your appointment, please contact [facility]  Dept: [phone]  and follow the prompts.  Office hours are 8:00 a.m. to 4:30 p.m. Monday - Friday. Please note that voicemails left after 4:00 p.m. may not be returned until the following business day.  We are closed weekends and major holidays. You have access to a nurse at all times for urgent questions. Please call the main number to the clinic Dept: [phone] and follow the prompts.   For any non-urgent questions, you may also contact your provider using MyChart. We now offer e-Visits for anyone 56 and older to request care online for non-urgent symptoms. For details visit [email or web address].   Also download the MyChart app! Go to the app store, search "MyChart", open the app, select [facility], and log in with your MyChart username and password.  Due to Covid, a mask is required upon entering the hospital/clinic. If you do not have a mask, one will be given to you upon arrival. For doctor visits, patients may have 1 support person aged 32 or older with them. For treatment visits, patients cannot have anyone with them due to current Covid guidelines and our immunocompromised population.

## 2021-11-13 NOTE — Progress Notes (Signed)
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 ° (b) a total of 2 left axillary lymph nodes removed, one positive ° °(14) anti-HER-2 treatment resumed after surgery to be continued indefinitely ° (a) echo 11/09/2019 shows an ejection fraction in the 60-65% range ° (b) echo 02/09/2020 shows an ejection fraction in the 45 to 50% range ° (c) echo 03/26/2020 shows an ejection fraction in the 55-60% range ° (d) trastuzumab resumed 03/28/2020 ° (e) echo 05/06/2020 shows an ejection fraction in the 55-60% range ° (f) trastuzumab discontinued after 06/20/2020 dose with progression ° °(14) switched to TDM-1/Kadcyla starting 07/11/2020 ° (a) new baseline PET scan 07/01/2020 shows significant supraclavicular, mediastinal and prevascular adenopathy with new small left pleural and pericardial effusions ° (b) echo 06/28/2020 shows and ejection fraction in the 55-60% range ° (c) PET scan on 08/15/2020 shows interval response to chemotherapy with decrease in size and SUV of lymphadenopathy, no new or progressive disease identified in abdomen, pelvis, or bones ° (d) switched back to Herceptin/trastuzumab  as of 10/24/2020 secondary to patient's concerns regarding possible neuropathy ° (e) repeat echocardiography 11/21/2020 shows an ejection fraction in the 55%  range ° (f) echocardiogram 02/17/2021 shows an ejection fraction in the 50-55% range ° (  g) brain MRI 11/28/2020 shows no evidence of metastatic disease ° (h) PET scan 02/17/2021 shows significant response in the mediastinal adenopathy and left lateral breast mass; no lung, liver or bone lesions ° (i) PET scan 06/26/2021 shows further decrease in the size and SUV of the left-sided lumpectomy, and no findings for hypermetabolic disease elsewhere; there are some reactive pelvic nodes felt secondary to obesity ° (j) echo 04/30/2021 and 08/04/2021 showing no change in ejection fraction ° °(14) did not receive adjuvant radiation (had radiation previously 2012). ° °(16) left lower extremity DVT documented by Doppler ultrasound 05/31/2020 ° (a) rivaroxaban/Xarelto started 05/31/2020 ° °(17) osteoarthritis/ degenerative disease ° (a) cervical spine MRI 09/08/2020 showed a herniated nucleus pulposus at cervical 4/5, no evidence of metastatic disease within the cervical spine ° (b) lumbar MRI 09/27/2020 showed significant degenerative disease, no evidence of metastasis ° (c) status post anterior cervical decompression C4/5 with arthrodesis 10/02/2020 ([place]) ° °(18) bilateral carpal tunnel syndrome documented by electromyography 08/05/2020, severe on the right, moderate on the left ([place]) ° ° °PLAN: °Alys is here today for follow-up of her metastatic breast cancer.  Since her last visit, she has no new issues.  She has ongoing neuropathy, foot drop and some cramping in her upper extremities.  She is continuing to tolerate trastuzumab well.  She has another follow-up with cardiology in April and has another echo ordered by her cardiologist in April.  Last echo stable.  No clinical evidence of cardiac compromise.  At this time it is safe to proceed with Herceptin based on review and she will proceed with repeat echo in April and follow-up with cardiology.  With regards to Retacrit, she does not need it today, hemoglobin  greater than 10.  She is due for repeat imaging which has been ordered.  She will continue Xarelto for history of DVT °With regards to her neuropathy, she will continue gabapentin as prescribed.  I have encouraged her to talk to her spine doctor about her cervical spine issues. ° °Total time spent: 40 minutes ° °Praveena Iruku, MD °Medical Oncology and Hematology °[facility] °[address] °[place], [place] [address] °Tel. [phone]    Fax. [phone] ° ° °*Total Encounter Time as defined by the Centers for Medicare and Medicaid Services includes, in addition to the face-to-face time of a patient visit (documented in the note above) non-face-to-face time: obtaining and reviewing outside history, ordering and reviewing medications, tests or procedures, care coordination (communications with other health care professionals or caregivers) and documentation in the medical record. °

## 2021-11-18 NOTE — Progress Notes (Signed)
[facility] PREP Weekly Session  Patient Details  Name: [name] MRN: [id number] Date of Birth: 02/05/1956 Age: 66 y.o. PCP: Biagio Borg, MD  Vitals:   11/17/21 1430  Weight: 223 lb 6.4 oz (101.3 kg)     [facility] Weekly seesion - 11/18/21 1600       [facility] "PREP" Location   [facility] "PREP" Location [facility]      Weekly Session   Topic Discussed Expectations and non-scale victories    Minutes exercised this week 125 minutes    Classes attended to date 66             [place] 11/18/2021, 4:10 PM

## 2021-11-19 ENCOUNTER — Other Ambulatory Visit: Payer: Self-pay | Admitting: Oncology

## 2021-11-26 ENCOUNTER — Other Ambulatory Visit: Payer: 59

## 2021-11-26 ENCOUNTER — Ambulatory Visit: Payer: 59

## 2021-12-01 ENCOUNTER — Other Ambulatory Visit: Payer: Self-pay

## 2021-12-01 ENCOUNTER — Ambulatory Visit (HOSPITAL_COMMUNITY)
Admission: RE | Admit: 2021-12-01 | Discharge: 2021-12-01 | Disposition: A | Discharge status: Home / Self Care | Payer: 59 | Source: Ambulatory Visit | Attending: Hematology and Oncology | Admitting: Hematology and Oncology

## 2021-12-01 DIAGNOSIS — C50812 Malignant neoplasm of overlapping sites of left female breast: Secondary | ICD-10-CM | POA: Insufficient documentation

## 2021-12-01 DIAGNOSIS — C50112 Malignant neoplasm of central portion of left female breast: Secondary | ICD-10-CM | POA: Diagnosis present

## 2021-12-01 DIAGNOSIS — Z171 Estrogen receptor negative status [ER-]: Secondary | ICD-10-CM | POA: Diagnosis present

## 2021-12-01 LAB — GLUCOSE, CAPILLARY: Glucose-Capillary: 114 mg/dL — ABNORMAL HIGH (ref 70–99)

## 2021-12-01 MED ORDER — FLUDEOXYGLUCOSE F - 18 (FDG) INJECTION
11.0000 | Freq: Once | INTRAVENOUS | Status: AC | PRN
  Administered 2021-12-01: 11 via INTRAVENOUS

## 2021-12-03 ENCOUNTER — Other Ambulatory Visit: Payer: Self-pay | Admitting: Hematology and Oncology

## 2021-12-04 ENCOUNTER — Inpatient Hospital Stay: Payer: 59

## 2021-12-04 ENCOUNTER — Inpatient Hospital Stay: Payer: 59 | Attending: Hematology and Oncology

## 2021-12-04 ENCOUNTER — Other Ambulatory Visit: Payer: Self-pay

## 2021-12-04 VITALS — BP 150/78 | HR 79 | Temp 98.1°F | Resp 18

## 2021-12-04 DIAGNOSIS — Z86718 Personal history of other venous thrombosis and embolism: Secondary | ICD-10-CM | POA: Diagnosis not present

## 2021-12-04 DIAGNOSIS — C50912 Malignant neoplasm of unspecified site of left female breast: Secondary | ICD-10-CM | POA: Insufficient documentation

## 2021-12-04 DIAGNOSIS — Z79899 Other long term (current) drug therapy: Secondary | ICD-10-CM | POA: Insufficient documentation

## 2021-12-04 DIAGNOSIS — D6481 Anemia due to antineoplastic chemotherapy: Secondary | ICD-10-CM | POA: Diagnosis not present

## 2021-12-04 DIAGNOSIS — T451X5A Adverse effect of antineoplastic and immunosuppressive drugs, initial encounter: Secondary | ICD-10-CM | POA: Diagnosis not present

## 2021-12-04 DIAGNOSIS — C50112 Malignant neoplasm of central portion of left female breast: Secondary | ICD-10-CM

## 2021-12-04 DIAGNOSIS — Z171 Estrogen receptor negative status [ER-]: Secondary | ICD-10-CM | POA: Diagnosis not present

## 2021-12-04 DIAGNOSIS — Z95828 Presence of other vascular implants and grafts: Secondary | ICD-10-CM

## 2021-12-04 DIAGNOSIS — C50812 Malignant neoplasm of overlapping sites of left female breast: Secondary | ICD-10-CM

## 2021-12-04 DIAGNOSIS — Z5112 Encounter for antineoplastic immunotherapy: Secondary | ICD-10-CM | POA: Diagnosis present

## 2021-12-04 DIAGNOSIS — Z7901 Long term (current) use of anticoagulants: Secondary | ICD-10-CM | POA: Insufficient documentation

## 2021-12-04 DIAGNOSIS — C773 Secondary and unspecified malignant neoplasm of axilla and upper limb lymph nodes: Secondary | ICD-10-CM | POA: Insufficient documentation

## 2021-12-04 LAB — CMP (CANCER CENTER ONLY)
ALT: 14 U/L (ref 0–44)
AST: 18 U/L (ref 15–41)
Albumin: 3.7 g/dL (ref 3.5–5.0)
Alkaline Phosphatase: 42 U/L (ref 38–126)
Anion gap: 5 (ref 5–15)
BUN: 27 mg/dL — ABNORMAL HIGH (ref 8–23)
CO2: 26 mmol/L (ref 22–32)
Calcium: 9 mg/dL (ref 8.9–10.3)
Chloride: 109 mmol/L (ref 98–111)
Creatinine: 0.97 mg/dL (ref 0.44–1.00)
GFR, Estimated: >60 mL/min (ref >60)
Glucose, Bld: 117 mg/dL — ABNORMAL HIGH (ref 70–99)
Potassium: 3.8 mmol/L (ref 3.5–5.1)
Sodium: 140 mmol/L (ref 135–145)
Total Bilirubin: 0.2 mg/dL — ABNORMAL LOW (ref 0.3–1.2)
Total Protein: 7.6 g/dL (ref 6.5–8.1)

## 2021-12-04 LAB — CBC WITH DIFFERENTIAL (CANCER CENTER ONLY)
Abs Immature Granulocytes: 0.02 10*3/uL (ref 0.00–0.07)
Basophils Absolute: 0 10*3/uL (ref 0.0–0.1)
Basophils Relative: 0 %
Eosinophils Absolute: 0.1 10*3/uL (ref 0.0–0.5)
Eosinophils Relative: 2 %
HCT: 30 % — ABNORMAL LOW (ref 36.0–46.0)
Hemoglobin: 9.6 g/dL — ABNORMAL LOW (ref 12.0–15.0)
Immature Granulocytes: 0 %
Lymphocytes Relative: 26 %
Lymphs Abs: 1.2 10*3/uL (ref 0.7–4.0)
MCH: 29.1 pg (ref 26.0–34.0)
MCHC: 32 g/dL (ref 30.0–36.0)
MCV: 90.9 fL (ref 80.0–100.0)
Monocytes Absolute: 0.5 10*3/uL (ref 0.1–1.0)
Monocytes Relative: 12 %
Neutro Abs: 2.7 10*3/uL (ref 1.7–7.7)
Neutrophils Relative %: 60 %
Platelet Count: 159 10*3/uL (ref 150–400)
RBC: 3.3 MIL/uL — ABNORMAL LOW (ref 3.87–5.11)
RDW: 14.3 % (ref 11.5–15.5)
WBC Count: 4.5 10*3/uL (ref 4.0–10.5)
nRBC: 0 % (ref 0.0–0.2)

## 2021-12-04 MED ORDER — SODIUM CHLORIDE 0.9% FLUSH
10.0000 mL | Freq: Once | INTRAVENOUS | Status: AC
  Administered 2021-12-04: 10 mL

## 2021-12-04 MED ORDER — EPOETIN ALFA-EPBX 40000 UNIT/ML IJ SOLN
40000.0000 [IU] | Freq: Once | INTRAMUSCULAR | Status: AC
  Administered 2021-12-04: 40000 [IU] via SUBCUTANEOUS
  Filled 2021-12-04: qty 1

## 2021-12-04 MED ORDER — TRASTUZUMAB-ANNS CHEMO 150 MG IV SOLR
600.0000 mg | Freq: Once | INTRAVENOUS | Status: AC
  Administered 2021-12-04: 600 mg via INTRAVENOUS
  Filled 2021-12-04: qty 28.57

## 2021-12-04 MED ORDER — ACETAMINOPHEN 325 MG PO TABS
650.0000 mg | ORAL_TABLET | Freq: Once | ORAL | Status: AC
  Administered 2021-12-04: 650 mg via ORAL
  Filled 2021-12-04: qty 2

## 2021-12-04 MED ORDER — SODIUM CHLORIDE 0.9 % IV SOLN: Freq: Once | INTRAVENOUS | Status: AC

## 2021-12-04 MED ORDER — DIPHENHYDRAMINE HCL 25 MG PO CAPS
25.0000 mg | ORAL_CAPSULE | Freq: Once | ORAL | Status: AC
  Administered 2021-12-04: 25 mg via ORAL
  Filled 2021-12-04: qty 1

## 2021-12-04 NOTE — Progress Notes (Signed)
[facility] PREP Weekly Session  Patient Details  Name: [name] MRN: [id number] Date of Birth: November 29, 1955 Age: 66 y.o. PCP: Biagio Borg, MD  Vitals:   12/01/21 1430  Weight: 219 lb (99.3 kg)     [facility] Weekly seesion - 12/04/21 1700       [facility] "PREP" Location   [facility]PREP" Location [facility]      Weekly Session   Topic Discussed Finding support    Minutes exercised this week 45 minutes    Classes attended to date 34           Late entry-class held on 12/01/21  Barnett Hatter 12/04/2021, 5:26 PM

## 2021-12-04 NOTE — Patient Instructions (Signed)
[facility]ICAL [facility]COLOG[facility]  Discharge Instructions: Thank you for choosing [facility] to provide your oncology and hematology care.   If you have a lab appointment with the Cancer Center, please go directly to the Cancer Center and check in at the registration area.   Wear comfortable clothing and clothing appropriate for easy access to any Portacath or PICC line.   We strive to give you quality time with your provider. You may need to reschedule your appointment if you arrive late (15 or more minutes).  Arriving late affects you and other patients whose appointments are after yours.  Also, if you miss three or more appointments without notifying the office, you may be dismissed from the clinic at the provider's discretion.      For prescription refill requests, have your pharmacy contact our office and allow 72 hours for refills to be completed.    Today you received the following chemotherapy and/or immunotherapy agents herceptin      To help prevent nausea and vomiting after your treatment, we encourage you to take your nausea medication as directed.  BELOW ARE SYMPTOMS THAT SHOULD BE REPORTED IMMEDIATELY: *FEVER GREATER THAN 100.4 F (38 C) OR HIGHER *CHILLS OR SWEATING *NAUSEA AND VOMITING THAT IS NOT CONTROLLED WITH YOUR NAUSEA MEDICATION *UNUSUAL SHORTNESS OF BREATH *UNUSUAL BRUISING OR BLEEDING *URINARY PROBLEMS (pain or burning when urinating, or frequent urination) *BOWEL PROBLEMS (unusual diarrhea, constipation, pain near the anus) TENDERNESS IN MOUTH AND THROAT WITH OR WITHOUT PRESENCE OF ULCERS (sore throat, sores in mouth, or a toothache) UNUSUAL RASH, SWELLING OR PAIN  UNUSUAL VAGINAL DISCHARGE OR ITCHING   Items with * indicate a potential emergency and should be followed up as soon as possible or go to the Emergency Department if any problems should occur.  Please show the CHEMOTHERAPY ALERT CARD or IMMUNOTHERAPY ALERT CARD at check-in to  the Emergency Department and triage nurse.  Should you have questions after your visit or need to cancel or reschedule your appointment, please contact [facility]  Dept: [phone]  and follow the prompts.  Office hours are 8:00 a.m. to 4:30 p.m. Monday - Friday. Please note that voicemails left after 4:00 p.m. may not be returned until the following business day.  We are closed weekends and major holidays. You have access to a nurse at all times for urgent questions. Please call the main number to the clinic Dept: [phone] and follow the prompts.   For any non-urgent questions, you may also contact your provider using MyChart. We now offer e-Visits for anyone 18 and older to request care online for non-urgent symptoms. For details visit [email or web address].   Also download the MyChart app! Go to the app store, search "MyChart", open the app, select [place], and log in with your MyChart username and password.  Due to Covid, a mask is required upon entering the hospital/clinic. If you do not have a mask, one will be given to you upon arrival. For doctor visits, patients may have 1 support person aged 18 or older with them. For treatment visits, patients cannot have anyone with them due to current Covid guidelines and our immunocompromised population.   

## 2021-12-09 ENCOUNTER — Telehealth: Payer: Self-pay | Admitting: Neurology

## 2021-12-09 NOTE — Telephone Encounter (Signed)
Patient has some concerns about her DX and seeing a neurosurgeon. She is a little confused about what is going on and what is supposed to be happening. She said she thought DX was neck pain, but it has to be more going on with her cause her hands and fingers are numb and drawn.

## 2021-12-09 NOTE — Telephone Encounter (Signed)
Called and spoke to patient about her concerns and questions. Patient was informed that per Dr. Posey Pronto a referral to spine surgeon was because her last MRI cervical spine from August shows shows narrowing in the cervical spine, above the level of her prior surgery.  She requested not to see Dr. Cyndy Freeze (who did her prior surgery), so we referred her to Dr Rolena Infante and [facility], per her request.   The diagnosis which she was referred to spine surgeon is cervical myelopathy (narrowing in the cervical spine), which can be associated with neck pain and cause her hands to be numb and weak.  If she has specific questions about what this means, she will need to schedule a follow-up visit to discuss.  Patient stated that she feels like she is getting the run around. She states Dr. Redmond Pulling wants her to see a PA or Nurse Practioner at his office but patient was hesitant about this. I informed patient that even if she is seen by the PA or Nurse Practioner, a physician will review and OK the plan of care that is being suggested by their PA's or Nurse Practioners. I informed patient that if she went to see the PA and did not like the care she received to let them know and atleast she gave it a try. Patient stated that she thinks she will keep that appointment and give the PA a try. Patient also stated that she will call back to the front office if she would like to come in to follow up with Dr. Posey Pronto. Patient had no further questions or concerns and thanked me for the call.

## 2021-12-09 NOTE — Progress Notes (Signed)
[facility] PREP Weekly Session  Patient Details  Name: [name] MRN: [id number] Date of Birth: 11/25/1955 Age: 66 y.o. PCP: Biagio Borg, MD  Vitals:   12/08/21 1430  Weight: 222 lb (100.7 kg)     [facility] Weekly seesion - 12/09/21 1700       [facility] "PREP" Location   [facility] "PREP" [facility]      Weekly Session   Minutes exercised this week 30 minutes    Classes attended to date 19             Barnett Hatter 12/09/2021, 5:41 PM

## 2021-12-09 NOTE — Telephone Encounter (Signed)
Referral to spine surgeon was because her last MRI cervical spine from August shows shows narrowing in the cervical spine, above the level of her prior surgery.  She requested not to see Dr. Cyndy Freeze (who did her prior surgery), so we referred her to Dr Rolena Infante and [facility], per her request.  The diagnosis which she was referred to spine surgeon is cervical myelopathy (narrowing in the cervical spine), which can be associated with neck pain and cause her hands to be numb and weak.  If she has specific questions about what this means, she will need to schedule a follow-up visit to discuss.

## 2021-12-10 ENCOUNTER — Telehealth: Payer: Self-pay | Admitting: *Deleted

## 2021-12-10 NOTE — Telephone Encounter (Signed)
Connected with Wynona Dove, [employer] [place] [employer] [facility] [employer] [phone]) regarding facsimile.  Reads "FINAL REQUEST" for Release of Information for records beginning 07/12/2020 to present requested previously requested 08/26/2021 and 08/21/2021.  "I spoke with [name] earlier today.  Says she is experiencing poor customer service trying to obtain records required for disability process.  [facility] e-mailed request to her to deliver.  Also faxed request to office on 08/26/2021, 09/02/2021, 09/10/2021, 10/01/2021 and now 12/09/2021 which included our general release which read [place]/Dr. Magrinat.  H.I.M. did send notice to [facility] I initiated for her and again [place] received the wrong, old information.  Even tried to call.  Do you all used [place]?"  See advice and instructions below. Provided name of new provider assigned to patient as original MD retired October 21, 2021. Record request must be addressed to our legal name "[place]" for (SW) Health Information Management Department to comply.  H.I.M. directs letters to claim managers indicating this.    Address to "[facility] attn.  Dr. Chryl Heck" if [facility] requires the provider name.  [place] could not be separated from the correct [place] is information NEVIA HENKIN shared with this nurse as reason most recent record request prepare 10/03/2021 to be forwarded or resubmitted to (SW) H.I.M.       Information before date of signature completes information released as request expire date of patient signature may be why [facility] received same information. *(SW) H.I.M. Dept., is to complete record release within thirty calendar days upon their receipt of a signed, H.I.P.A.A approved authorization per [facility]. Unknown by this nurse if [facility] is used, connect with (SW) H.I.M with questions, needs or clarification. Medical records are not managed on  site. Forms nurse completes and returns FMLA/Disability/Accommodation form only. If claim request records or tests accompany form (SW) H.I.M. contact information provided on cover sheet as "Records to follow" per this department.   "Will connect with [name], resend request to (SW) H.I.M.  We do need a patient update however the records are priority for this claim.  Last patient update received October 2022 and none of need more grey hair.  Provided fax number for (SW) H.I.M [phone]) when [employer] asked for confirmation.    Requested [facility] send multi-fax including [phone] for form completion at [facility], office site.   No further questions or needs.

## 2021-12-10 NOTE — Telephone Encounter (Signed)
Connected with MALILLANY KAZLAUSKAS [phone]) regarding Disability [employer].   "I've called records team several times since December.  This is very aggravating.  Records office asked why I am calling because the claims office usually calls.  I am so confused.  Something went wrong with what [facility] sent the [facility].  Disability was reduced.  If records are not received disability will be cancelled."  Apologized for any inconveniences or delays.  Will complete disability update when needed.

## 2021-12-16 NOTE — Progress Notes (Signed)
[facility] PREP Weekly Session  Patient Details  Name: [name] MRN: [id number] Date of Birth: 18-Apr-1956 Age: 66 y.o. PCP: Biagio Borg, MD  Vitals:   12/15/21 1430  Weight: 222 lb (100.7 kg)     [facility] Weekly seesion - 12/16/21 1700       [facility] "PREP" Location   [facility]      Weekly Session   Topic Discussed Calorie breakdown;Hitting roadblocks   surveys and fit test completed   Minutes exercised this week 0 minutes    Classes attended to date 18            Late entry class held on 12/15/21 Admitted to not wanting to come to class and being down about mother and stress. Has friend for support. Offered to assist should she need help locating additional resources.   Barnett Hatter 12/16/2021, 5:19 PM

## 2021-12-19 ENCOUNTER — Other Ambulatory Visit: Payer: Self-pay | Admitting: Hematology and Oncology
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
## 2021-12-22 ENCOUNTER — Telehealth: Payer: Self-pay | Admitting: Hematology and Oncology

## 2021-12-22 NOTE — Telephone Encounter (Signed)
Called patient regarding upcoming appointment per 03/03 scheduled message. ?

## 2021-12-24 ENCOUNTER — Encounter: Payer: Self-pay | Admitting: *Deleted

## 2021-12-24 NOTE — Progress Notes (Signed)
Completed Disability paperwork for [facility] and sent to Dr. Chryl Heck for signature. ?

## 2021-12-25 ENCOUNTER — Other Ambulatory Visit: Payer: Self-pay

## 2021-12-25 ENCOUNTER — Inpatient Hospital Stay: Payer: 59

## 2021-12-25 ENCOUNTER — Inpatient Hospital Stay: Payer: 59 | Attending: Hematology and Oncology

## 2021-12-25 VITALS — BP 150/74 | HR 61 | Temp 97.6°F | Resp 18 | Wt 224.0 lb

## 2021-12-25 DIAGNOSIS — Z171 Estrogen receptor negative status [ER-]: Secondary | ICD-10-CM | POA: Diagnosis not present

## 2021-12-25 DIAGNOSIS — Z79899 Other long term (current) drug therapy: Secondary | ICD-10-CM | POA: Insufficient documentation

## 2021-12-25 DIAGNOSIS — C50112 Malignant neoplasm of central portion of left female breast: Secondary | ICD-10-CM

## 2021-12-25 DIAGNOSIS — G629 Polyneuropathy, unspecified: Secondary | ICD-10-CM | POA: Insufficient documentation

## 2021-12-25 DIAGNOSIS — Z95828 Presence of other vascular implants and grafts: Secondary | ICD-10-CM

## 2021-12-25 DIAGNOSIS — Z5112 Encounter for antineoplastic immunotherapy: Secondary | ICD-10-CM | POA: Diagnosis not present

## 2021-12-25 DIAGNOSIS — M549 Dorsalgia, unspecified: Secondary | ICD-10-CM | POA: Diagnosis not present

## 2021-12-25 DIAGNOSIS — M542 Cervicalgia: Secondary | ICD-10-CM | POA: Insufficient documentation

## 2021-12-25 DIAGNOSIS — Z7901 Long term (current) use of anticoagulants: Secondary | ICD-10-CM | POA: Diagnosis not present

## 2021-12-25 DIAGNOSIS — C50812 Malignant neoplasm of overlapping sites of left female breast: Secondary | ICD-10-CM

## 2021-12-25 DIAGNOSIS — C773 Secondary and unspecified malignant neoplasm of axilla and upper limb lymph nodes: Secondary | ICD-10-CM | POA: Insufficient documentation

## 2021-12-25 DIAGNOSIS — C50912 Malignant neoplasm of unspecified site of left female breast: Secondary | ICD-10-CM | POA: Diagnosis present

## 2021-12-25 LAB — CMP (CANCER CENTER ONLY)
ALT: 16 U/L (ref 0–44)
AST: 20 U/L (ref 15–41)
Albumin: 3.6 g/dL (ref 3.5–5.0)
Alkaline Phosphatase: 48 U/L (ref 38–126)
Anion gap: 6 (ref 5–15)
BUN: 21 mg/dL (ref 8–23)
CO2: 26 mmol/L (ref 22–32)
Calcium: 9.3 mg/dL (ref 8.9–10.3)
Chloride: 109 mmol/L (ref 98–111)
Creatinine: 0.86 mg/dL (ref 0.44–1.00)
GFR, Estimated: >60 mL/min (ref >60)
Glucose, Bld: 97 mg/dL (ref 70–99)
Potassium: 3.8 mmol/L (ref 3.5–5.1)
Sodium: 141 mmol/L (ref 135–145)
Total Bilirubin: 0.4 mg/dL (ref 0.3–1.2)
Total Protein: 7.5 g/dL (ref 6.5–8.1)

## 2021-12-25 LAB — CBC WITH DIFFERENTIAL (CANCER CENTER ONLY)
Abs Immature Granulocytes: 0 10*3/uL (ref 0.00–0.07)
Basophils Absolute: 0 10*3/uL (ref 0.0–0.1)
Basophils Relative: 0 %
Eosinophils Absolute: 0 10*3/uL (ref 0.0–0.5)
Eosinophils Relative: 1 %
HCT: 31.9 % — ABNORMAL LOW (ref 36.0–46.0)
Hemoglobin: 10 g/dL — ABNORMAL LOW (ref 12.0–15.0)
Immature Granulocytes: 0 %
Lymphocytes Relative: 27 %
Lymphs Abs: 1.2 10*3/uL (ref 0.7–4.0)
MCH: 28.5 pg (ref 26.0–34.0)
MCHC: 31.3 g/dL (ref 30.0–36.0)
MCV: 90.9 fL (ref 80.0–100.0)
Monocytes Absolute: 0.5 10*3/uL (ref 0.1–1.0)
Monocytes Relative: 12 %
Neutro Abs: 2.6 10*3/uL (ref 1.7–7.7)
Neutrophils Relative %: 60 %
Platelet Count: 193 10*3/uL (ref 150–400)
RBC: 3.51 MIL/uL — ABNORMAL LOW (ref 3.87–5.11)
RDW: 14.6 % (ref 11.5–15.5)
WBC Count: 4.3 10*3/uL (ref 4.0–10.5)
nRBC: 0 % (ref 0.0–0.2)

## 2021-12-25 MED ORDER — DIPHENHYDRAMINE HCL 25 MG PO CAPS
25.0000 mg | ORAL_CAPSULE | Freq: Once | ORAL | Status: AC
  Administered 2021-12-25: 09:00:00 25 mg via ORAL
  Filled 2021-12-25: qty 1

## 2021-12-25 MED ORDER — EPOETIN ALFA-EPBX 40000 UNIT/ML IJ SOLN: 40000.0000 [IU] | Freq: Once | INTRAMUSCULAR | Status: DC

## 2021-12-25 MED ORDER — HEPARIN SOD (PORK) LOCK FLUSH 100 UNIT/ML IV SOLN
500.0000 [IU] | Freq: Once | INTRAVENOUS | Status: AC | PRN
  Administered 2021-12-25: 11:00:00 500 [IU]

## 2021-12-25 MED ORDER — SODIUM CHLORIDE 0.9 % IV SOLN: Freq: Once | INTRAVENOUS | Status: AC

## 2021-12-25 MED ORDER — TRASTUZUMAB-ANNS CHEMO 150 MG IV SOLR
600.0000 mg | Freq: Once | INTRAVENOUS | Status: AC
  Administered 2021-12-25: 10:00:00 600 mg via INTRAVENOUS
  Filled 2021-12-25: qty 28.57

## 2021-12-25 MED ORDER — SODIUM CHLORIDE 0.9% FLUSH
10.0000 mL | INTRAVENOUS | Status: DC | PRN
  Administered 2021-12-25: 11:00:00 10 mL

## 2021-12-25 MED ORDER — ACETAMINOPHEN 325 MG PO TABS
650.0000 mg | ORAL_TABLET | Freq: Once | ORAL | Status: AC
  Administered 2021-12-25: 09:00:00 650 mg via ORAL
  Filled 2021-12-25: qty 2

## 2021-12-25 MED ORDER — SODIUM CHLORIDE 0.9% FLUSH
10.0000 mL | Freq: Once | INTRAVENOUS | Status: AC
  Administered 2021-12-25: 09:00:00 10 mL

## 2021-12-25 NOTE — Patient Instructions (Signed)
[facility]  Discharge Instructions: ?Thank you for choosing [facility] to provide your oncology and hematology care.  ? ?If you have a lab appointment with the Cancer Center, please go directly to the Cancer Center and check in at the registration area. ?  ?Wear comfortable clothing and clothing appropriate for easy access to any Portacath or PICC line.  ? ?We strive to give you quality time with your provider. You may need to reschedule your appointment if you arrive late (15 or more minutes).  Arriving late affects you and other patients whose appointments are after yours.  Also, if you miss three or more appointments without notifying the office, you may be dismissed from the clinic at the provider?s discretion.    ?  ?For prescription refill requests, have your pharmacy contact our office and allow 72 hours for refills to be completed.   ? ?Today you received the following chemotherapy and/or immunotherapy agents: Trastuzumab    ?  ?To help prevent nausea and vomiting after your treatment, we encourage you to take your nausea medication as directed. ? ?BELOW ARE SYMPTOMS THAT SHOULD BE REPORTED IMMEDIATELY: ?*FEVER GREATER THAN 100.4 F (38 ?C) OR HIGHER ?*CHILLS OR SWEATING ?*NAUSEA AND VOMITING THAT IS NOT CONTROLLED WITH YOUR NAUSEA MEDICATION ?*UNUSUAL SHORTNESS OF BREATH ?*UNUSUAL BRUISING OR BLEEDING ?*URINARY PROBLEMS (pain or burning when urinating, or frequent urination) ?*BOWEL PROBLEMS (unusual diarrhea, constipation, pain near the anus) ?TENDERNESS IN MOUTH AND THROAT WITH OR WITHOUT PRESENCE OF ULCERS (sore throat, sores in mouth, or a toothache) ?UNUSUAL RASH, SWELLING OR PAIN  ?UNUSUAL VAGINAL DISCHARGE OR ITCHING  ? ?Items with * indicate a potential emergency and should be followed up as soon as possible or go to the Emergency Department if any problems should occur. ? ?Please show the CHEMOTHERAPY ALERT CARD or IMMUNOTHERAPY ALERT CARD at check-in  to the Emergency Department and triage nurse. ? ?Should you have questions after your visit or need to cancel or reschedule your appointment, please contact [facility]  Dept: [phone]  and follow the prompts.  Office hours are 8:00 a.m. to 4:30 p.m. Monday - Friday. Please note that voicemails left after 4:00 p.m. may not be returned until the following business day.  We are closed weekends and major holidays. You have access to a nurse at all times for urgent questions. Please call the main number to the clinic Dept: [phone] and follow the prompts. ? ? ?For any non-urgent questions, you may also contact your provider using MyChart. We now offer e-Visits for anyone 18 and older to request care online for non-urgent symptoms. For details visit [email or web address]. ?  ?Also download the MyChart app! Go to the app store, search "MyChart", open the app, select [facility], and log in with your MyChart username and password. ? ?Due to Covid, a mask is required upon entering the hospital/clinic. If you do not have a mask, one will be given to you upon arrival. For doctor visits, patients may have 1 support person aged 18 or older with them. For treatment visits, patients cannot have anyone with them due to current Covid guidelines and our immunocompromised population.  ? ?

## 2021-12-28 ENCOUNTER — Other Ambulatory Visit (HOSPITAL_COMMUNITY): Payer: Self-pay | Admitting: Cardiology

## 2022-01-01 ENCOUNTER — Telehealth: Payer: Self-pay | Admitting: *Deleted

## 2022-01-01 NOTE — Telephone Encounter (Signed)
Message left requesting [name] [phone] (home) to request she sign a ROI tomorrow.   ?This nurse received completed, signed disability form today.   ?Apologized for delay, provider out of office 12/20/2021 through 12/30/2021. ?[facility] requested records beginning 07/12/2020 through present for Dr. Jana Hakim and Dr. Chryl Heck.   ?Unable to use previously ROI signed 09/15/2021 which reads: 08/19/2020 through 08/19/2021 as dates of service is also expired.         ? ?

## 2022-01-02 ENCOUNTER — Other Ambulatory Visit: Payer: Self-pay

## 2022-01-02 ENCOUNTER — Encounter: Payer: Self-pay | Admitting: Hematology and Oncology

## 2022-01-02 ENCOUNTER — Inpatient Hospital Stay: Payer: 59

## 2022-01-02 ENCOUNTER — Inpatient Hospital Stay (HOSPITAL_BASED_OUTPATIENT_CLINIC_OR_DEPARTMENT_OTHER): Payer: 59 | Admitting: Hematology and Oncology

## 2022-01-02 DIAGNOSIS — Z5112 Encounter for antineoplastic immunotherapy: Secondary | ICD-10-CM | POA: Diagnosis not present

## 2022-01-02 DIAGNOSIS — C50812 Malignant neoplasm of overlapping sites of left female breast: Secondary | ICD-10-CM | POA: Diagnosis not present

## 2022-01-02 DIAGNOSIS — Z171 Estrogen receptor negative status [ER-]: Secondary | ICD-10-CM

## 2022-01-02 NOTE — Progress Notes (Signed)
ID: [name]   DOB: [birth year]  MR#: [id number]  OZH#:[id number] ? ? ?Patient Care Team: ?Jacqueline Borg, Young as PCP - General ?Young, Jacqueline Dad, Young (Inactive) as Consulting Physician (Hematology and Oncology) ?Jacqueline Arabian, Young as Consulting Physician (Orthopedic Surgery) ?Jacqueline Amber, Young as Consulting Physician (Gynecologic Oncology) ?Jacqueline Mesa, Young as Consulting Physician (General Surgery) ?Jacqueline Germany, RN as Oncology Nurse Navigator ?Jacqueline Kaufmann, RN as Oncology Nurse Navigator ?Jacqueline Dresser, Young as Consulting Physician (Cardiology) ?Jacqueline Pall, Young as Consulting Physician (Neurosurgery) ?Jacqueline Brod, Young as Consulting Physician (Orthopedic Surgery) ?Jacqueline Berthold, DO as Consulting Physician (Neurology) ?Jacqueline Merino, Young as Consulting Physician (Rheumatology) ?Jacqueline Nunnery, Young (Inactive) as Consulting Physician (Otolaryngology) ?[name]: ? ?CHIEF COMPLAINT:  metastatic breast cancer, estrogen receptor negative ? ?CURRENT TREATMENT: herceptin; rivaroxaban/Xarelto; retacrit ? ?INTERVAL HISTORY: ? ?[name] returns today for follow up and treatment of her metastatic breast cancer.   ?She was changed to Herceptin alone as of 10/24/2020.  ?Repeat ECHO in January stable and no cardiac symptoms. ?[name] is also scheduled for Retacrit every 3 weeks for a hemoglobin of less than 10.  Today her hemoglobin is 10.1 today.   ?She is doing well except for back pain, neck pain and neuropathy related issues. She also describes cramps in her left breast. She says she may be doing simple things like picking up something in the dryer and the back starts hurting. ?Last PET/CT in February 2023 with no findings suspicious for recurrent hypermetabolic metastatic disease, no recurrent hypermetabolic dermal surgical site in left axillary and outer left breast, left adrenal 1 cm nodule stable in size since baseline.  PET/CT study and has demonstrated variable mild hypermetabolic seen throughout all  previous PET/CT studies favoring a benign left adrenal adenoma, mildly prominent bilateral external iliac and inguinal lymph nodes stable in size and demonstrate low-level uptake below the mediastinal blood pool favoring benign reactive nodes, aortic atherosclerosis ? ?She has follow up with Dr Deatra Robinson and ECHO in April. ? ?REVIEW OF SYSTEMS: ?Review of Systems  ?Constitutional:  Positive for fatigue. Negative for appetite change, chills, fever and unexpected weight change.  ?HENT:   Negative for hearing loss, lump/mass, mouth sores and trouble swallowing.   ?Eyes:  Negative for eye problems and icterus.  ?Respiratory:  Negative for chest tightness, cough and shortness of breath.   ?Cardiovascular:  Negative for chest pain, leg swelling and palpitations.  ?Gastrointestinal:  Negative for abdominal distention, abdominal pain, constipation, diarrhea, nausea and vomiting.  ?Endocrine: Negative for hot flashes.  ?Genitourinary:  Negative for difficulty urinating.   ?Musculoskeletal:  Positive for gait problem (walks with cane due to right foot drop). Negative for arthralgias.  ?Skin:  Negative for itching and rash.  ?Neurological:  Positive for extremity weakness (chronic right foot drop), gait problem (walks with cane due to right foot drop) and numbness. Negative for dizziness and headaches.  ?Hematological:  Negative for adenopathy. Does not bruise/bleed easily.  ?Psychiatric/Behavioral:  Negative for depression. The patient is not nervous/anxious.   ? ?  ?COVID 19 VACCINATION STATUS: Status post [name] x2, most recently April 2021 ? ? ?HISTORY OF LEFT BREAST CANCER: ?From the original intake note: ? ? ?Oncology History Overview Note  ?A: INVASIVE DUCTAL CARCINOMA LEFT BREAST ?(1)  status post left lumpectomy and sentinel lymph node dissection April of 2012 for a T1b N1(mic) stage IB invasive ductal carcinoma, grade 1, estrogen receptor 82% and progesterone receptor 92% positive, with no HER-2 amplification, and  an  MIB-1-1 of 17%,  ? ?(2)  The patient's Oncotype DX score of 21 predicted a 13% risk of distant recurrence after 5 years of tamoxifen. ? ?(3)  status post radiation completed August of 2012,  ? ?(4)  on tamoxifen from September of 2012 to April 2014 ? ?(5) the plan had been to initiate anastrozole in April 2014, but the patient had a menstrual cycle in May 2014, and resumed tamoxifen. ? (a) discontinued tamoxifen on her own initiative June 2015 because of "aches and pains". ? (b) resumed tamoxifen December 2015, discontinued February 2016 at patient's discretion ? ?(6) morbid obesity: s/p [employer] program; considering bariattric surgery ? ?B: ENDOMETRIAL CANCER ?(7) S/P laparoscopic hysterectomy with bilateral salpingo-oophorectomy and sentinel lymph node biopsy 06/16/2016 for a pT1a pN0, grade 1 endometrioid carcinoma ? 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 ?(9) definitive surgery delayed (see discussion in 11/03/2019 note) ? ? ?[name] had bilateral diagnostic mammography at [facility] on 04/27/2019 with a complaint of left breast cramping and soreness.  This has been present approximately a year.  The study found a new 3.5 cm area of focal asymmetry with amorphous calcification in the left breast upper outer quadrant.  Left breast ultrasonography on the same day found a 2.7 cm region with indistinct margins which was slightly hypoechoic.  This was palpable as a mass in the upper outer aspect of the breast. ? ?Biopsy of this area obtained 04/28/2019 20?2 found ([id number]) ductal carcinoma in situ, grade 3, estrogen and progesterone receptor negative. ? ?She met with surgery and plastics and Dr. [name] recommended mastectomy.  Dr. [name] suggested late reconstruction.  She saw me on 06/02/2019 and I set her up for genetics testing and agreed with mastectomy.  We also  discussed weight loss management issues at that time.  Genetics testing was done and showed no pathogenic mutations. ? ?However surgery was not performed.  She tells me she was not called back but also admits "it is partly my fault" since she had mixed feelings about the surgery and she herself did not follow-up with her doctors to get a definitive plan.  She had an appointment here on 09/04/2019 which she canceled. ? ?Instead the next note I have in the record after August 2020 is from Dr. [name] dated 09/22/2019.  He confirmed a palpable mass in the left upper outer quadrant at 2:00 measuring about 2.5 cm.  There was no nipple retraction or skin dimpling.  He palpated a mass in the left axilla.  He again discussed mastectomy with the patient but he also set her up for left diagnostic mammography at [facility], performed 10/25/2019.  In the breast there are pleomorphic calcifications associated with the prior biopsy clip sites and a new 0.5 cm mass surrounding the coil clip at 2:00.  In addition there were 2 new enlarged abnormal left axillary lymph nodes.  Ultrasound-guided biopsy was obtained 10/30/2019 and showed ([id number]) invasive mammary carcinoma, grade 3. Prognostic indicators significant for: ER, 80% positive with weak staining intensity and PR, 0% negative. Proliferation marker Ki67 at 70%. HER2 positive (3+). ? ?(10) genetics testing 06/14/2019 through the Multi-Gene Panel offered by [facility] found no deleterious mutations in AIP, ALK, APC, ATM, AXIN2,BAP1,  BARD1, BLM, BMPR1A, BRCA1, BRCA2, BRIP1, CASR, CDC73, CDH1, CDK4, CDKN1B, CDKN1C, CDKN2A (p14ARF), CDKN2A (p16INK4a), CEBPA, CHEK2, CTNNA1, DICER1, DIS3L2, EGFR (c.2369C>T, p.Thr790Met  variant only), EPCAM (Deletion/duplication testing only), FH, FLCN, GATA2, GPC3, GREM1 (Promoter region deletion/duplication testing only), HOXB13 (c.251G>A, p.Gly84Glu), HRAS, KIT, MAX, MEN1, MET, MITF (c.952G>A, p.Glu318Lys variant only), MLH1, MSH2, MSH3, MSH6, MUTYH,  NBN, NF1, NF2, NTHL1, PALB2, PDGFRA, PHOX2B, PMS2, POLD1, POLE, POT1, PRKAR1A, PTCH1, PTEN, RAD50, RAD51C, RAD51D, RB1, RECQL4, RET, RNF43, RUNX1, SDHAF2, SDHA (sequence changes only), SDHB, SDHC, SDHD, SMAD4,

## 2022-01-05 NOTE — Telephone Encounter (Signed)
This forms nurse returned to [facility] today.  Received authorization to ROI signed by [name] on 01/02/2022 with correct dates of service as requested by [employer] [facility]. ?Faxed to ([facility]) H.I.[facility]. stamped urgent.   ?Faxed FYI notice of above "Records to follow" to [employer] [facility] to inform of status of record request. ?Envelope to alphabetical file folder in appointment registration area behind registrar number one for patient pick up upon next scheduled appointment.      ?

## 2022-01-07 NOTE — Telephone Encounter (Signed)
Request for medical records received today from [employer] [facility] at 1522 with new message reading "STAT Please". ? ?This nurse faxed request to (SW) H.I.M. [phone]) marked URGENT.  Facsimile was also given to [place].I.M staff for delivery to (SW) H.I.M. ?

## 2022-01-12 ENCOUNTER — Telehealth: Payer: Self-pay | Admitting: *Deleted

## 2022-01-12 NOTE — Telephone Encounter (Signed)
Connected with LEONORA GORES [phone] (home) about voicemail received.   ?"Why has the [facility] not received information yet?  How often do I have to sign a release?  Have signed several releases, yet no information has been received.  Signed one on 01/02/2022, December 2022, November 2022.  Last week I spoke with Blanch Media with the [facility] and no information has been sent.   Do not understand.  Please call." ? ?This nurse advised of [facility] request for Medical records effective 07/12/2020 to present did not match dates of previously signed releases for information.  Information may be requested up to 90-days from date of signature to receive information before or through the date of signature.  Therefore, for updates of information, a new release will need to be signed or the same information will be sent. ? ?"This is ongoing and does not make any sense.  [facility] has not yet received any of my medical records explaining my condition.  I have been seen several times (sixteen noted to date) since September 2021 which the other signed releases should cover this time period."   ? ?Blanch Media, with [facility] previously expressed "Received the same old information", perhaps per service dates requested on ROI's.  No, Blanch [employer] did not provide dates of the information received to this nurse, only whatever was received was sent twice to the [facility].  Advise you or Claims manager, Blanch Media, connect with (SW) H.I.M.   This nurse only able to see recent request received by (SW) H.I.M. on 0/93/2355, [facility] policy allows up to thirty days to complete request.  [facility] unable to view workflow, status or what was previously sent.        ? ?"How do they have thirty days and the initial request was never fulfilled.  Rozelle Logan says a link of about one hundred-fifty pages was sent that has been accessed by the [facility].  There must be something wrong with there link."   ? ? ?  ?

## 2022-01-15 ENCOUNTER — Other Ambulatory Visit: Payer: Self-pay

## 2022-01-15 ENCOUNTER — Inpatient Hospital Stay: Payer: 59

## 2022-01-15 VITALS — BP 134/67 | HR 62 | Temp 99.5°F | Resp 16 | Wt 227.2 lb

## 2022-01-15 DIAGNOSIS — C50112 Malignant neoplasm of central portion of left female breast: Secondary | ICD-10-CM

## 2022-01-15 DIAGNOSIS — Z5112 Encounter for antineoplastic immunotherapy: Secondary | ICD-10-CM | POA: Diagnosis not present

## 2022-01-15 DIAGNOSIS — Z95828 Presence of other vascular implants and grafts: Secondary | ICD-10-CM

## 2022-01-15 DIAGNOSIS — C50812 Malignant neoplasm of overlapping sites of left female breast: Secondary | ICD-10-CM

## 2022-01-15 LAB — CBC WITH DIFFERENTIAL (CANCER CENTER ONLY)
Abs Immature Granulocytes: 0.02 10*3/uL (ref 0.00–0.07)
Basophils Absolute: 0 10*3/uL (ref 0.0–0.1)
Basophils Relative: 0 %
Eosinophils Absolute: 0.1 10*3/uL (ref 0.0–0.5)
Eosinophils Relative: 2 %
HCT: 31.6 % — ABNORMAL LOW (ref 36.0–46.0)
Hemoglobin: 10 g/dL — ABNORMAL LOW (ref 12.0–15.0)
Immature Granulocytes: 0 %
Lymphocytes Relative: 27 %
Lymphs Abs: 1.6 10*3/uL (ref 0.7–4.0)
MCH: 28.4 pg (ref 26.0–34.0)
MCHC: 31.6 g/dL (ref 30.0–36.0)
MCV: 89.8 fL (ref 80.0–100.0)
Monocytes Absolute: 0.7 10*3/uL (ref 0.1–1.0)
Monocytes Relative: 12 %
Neutro Abs: 3.3 10*3/uL (ref 1.7–7.7)
Neutrophils Relative %: 59 %
Platelet Count: 175 10*3/uL (ref 150–400)
RBC: 3.52 MIL/uL — ABNORMAL LOW (ref 3.87–5.11)
RDW: 14.5 % (ref 11.5–15.5)
WBC Count: 5.7 10*3/uL (ref 4.0–10.5)
nRBC: 0 % (ref 0.0–0.2)

## 2022-01-15 LAB — CMP (CANCER CENTER ONLY)
ALT: 13 U/L (ref 0–44)
AST: 18 U/L (ref 15–41)
Albumin: 3.7 g/dL (ref 3.5–5.0)
Alkaline Phosphatase: 46 U/L (ref 38–126)
Anion gap: 6 (ref 5–15)
BUN: 35 mg/dL — ABNORMAL HIGH (ref 8–23)
CO2: 30 mmol/L (ref 22–32)
Calcium: 9.5 mg/dL (ref 8.9–10.3)
Chloride: 103 mmol/L (ref 98–111)
Creatinine: 1.09 mg/dL — ABNORMAL HIGH (ref 0.44–1.00)
GFR, Estimated: 57 mL/min — ABNORMAL LOW (ref >60)
Glucose, Bld: 110 mg/dL — ABNORMAL HIGH (ref 70–99)
Potassium: 3.6 mmol/L (ref 3.5–5.1)
Sodium: 139 mmol/L (ref 135–145)
Total Bilirubin: 0.4 mg/dL (ref 0.3–1.2)
Total Protein: 7.8 g/dL (ref 6.5–8.1)

## 2022-01-15 MED ORDER — DIPHENHYDRAMINE HCL 25 MG PO CAPS
25.0000 mg | ORAL_CAPSULE | Freq: Once | ORAL | Status: AC
  Administered 2022-01-15: 25 mg via ORAL
  Filled 2022-01-15: qty 1

## 2022-01-15 MED ORDER — SODIUM CHLORIDE 0.9% FLUSH
10.0000 mL | Freq: Once | INTRAVENOUS | Status: AC
  Administered 2022-01-15: 10 mL

## 2022-01-15 MED ORDER — EPOETIN ALFA-EPBX 40000 UNIT/ML IJ SOLN: 40000.0000 [IU] | Freq: Once | INTRAMUSCULAR | Status: DC

## 2022-01-15 MED ORDER — HEPARIN SOD (PORK) LOCK FLUSH 100 UNIT/ML IV SOLN
500.0000 [IU] | Freq: Once | INTRAVENOUS | Status: AC | PRN
  Administered 2022-01-15: 500 [IU]

## 2022-01-15 MED ORDER — ACETAMINOPHEN 325 MG PO TABS
650.0000 mg | ORAL_TABLET | Freq: Once | ORAL | Status: AC
  Administered 2022-01-15: 650 mg via ORAL
  Filled 2022-01-15: qty 2

## 2022-01-15 MED ORDER — TRASTUZUMAB-ANNS CHEMO 150 MG IV SOLR
600.0000 mg | Freq: Once | INTRAVENOUS | Status: AC
  Administered 2022-01-15: 600 mg via INTRAVENOUS
  Filled 2022-01-15: qty 28.57

## 2022-01-15 MED ORDER — SODIUM CHLORIDE 0.9 % IV SOLN: Freq: Once | INTRAVENOUS | Status: AC

## 2022-01-15 MED ORDER — SODIUM CHLORIDE 0.9% FLUSH
10.0000 mL | INTRAVENOUS | Status: DC | PRN
  Administered 2022-01-15: 10 mL

## 2022-01-15 NOTE — Patient Instructions (Signed)
[facility]  Discharge Instructions: °Thank you for choosing [facility] to provide your oncology and hematology care.  ° °If you have a lab appointment with the Cancer Center, please go directly to the Cancer Center and check in at the registration area. °  °Wear comfortable clothing and clothing appropriate for easy access to any Portacath or PICC line.  ° °We strive to give you quality time with your provider. You may need to reschedule your appointment if you arrive late (15 or more minutes).  Arriving late affects you and other patients whose appointments are after yours.  Also, if you miss three or more appointments without notifying the office, you may be dismissed from the clinic at the provider’s discretion.    °  °For prescription refill requests, have your pharmacy contact our office and allow 72 hours for refills to be completed.   ° °Today you received the following chemotherapy and/or immunotherapy agents: Kanjinti °  °To help prevent nausea and vomiting after your treatment, we encourage you to take your nausea medication as directed. ° °BELOW ARE SYMPTOMS THAT SHOULD BE REPORTED IMMEDIATELY: °*FEVER GREATER THAN 100.4 F (38 °C) OR HIGHER °*CHILLS OR SWEATING °*NAUSEA AND VOMITING THAT IS NOT CONTROLLED WITH YOUR NAUSEA MEDICATION °*UNUSUAL SHORTNESS OF BREATH °*UNUSUAL BRUISING OR BLEEDING °*URINARY PROBLEMS (pain or burning when urinating, or frequent urination) °*BOWEL PROBLEMS (unusual diarrhea, constipation, pain near the anus) °TENDERNESS IN MOUTH AND THROAT WITH OR WITHOUT PRESENCE OF ULCERS (sore throat, sores in mouth, or a toothache) °UNUSUAL RASH, SWELLING OR PAIN  °UNUSUAL VAGINAL DISCHARGE OR ITCHING  ° °Items with * indicate a potential emergency and should be followed up as soon as possible or go to the Emergency Department if any problems should occur. ° °Please show the CHEMOTHERAPY ALERT CARD or IMMUNOTHERAPY ALERT CARD at check-in to the  Emergency Department and triage nurse. ° °Should you have questions after your visit or need to cancel or reschedule your appointment, please contact [facility]  Dept: [phone]  and follow the prompts.  Office hours are 8:00 a.m. to 4:30 p.m. Monday - Friday. Please note that voicemails left after 4:00 p.m. may not be returned until the following business day.  We are closed weekends and major holidays. You have access to a nurse at all times for urgent questions. Please call the main number to the clinic Dept: [phone] and follow the prompts. ° ° °For any non-urgent questions, you may also contact your provider using MyChart. We now offer e-Visits for anyone 18 and older to request care online for non-urgent symptoms. For details visit [email or web address]. °  °Also download the MyChart app! Go to the app store, search "MyChart", open the app, select [facility], and log in with your MyChart username and password. ° °Due to Covid, a mask is required upon entering the hospital/clinic. If you do not have a mask, one will be given to you upon arrival. For doctor visits, patients may have 1 support person aged 18 or older with them. For treatment visits, patients cannot have anyone with them due to current Covid guidelines and our immunocompromised population.  ° °

## 2022-01-23 ENCOUNTER — Telehealth: Payer: Self-pay | Admitting: *Deleted

## 2022-01-23 NOTE — Telephone Encounter (Signed)
Facsimile received from [employer].    "Evaluating long-term disability benefits for [name], claim no.# [id number].  Have not received a response to date for medical records for the period from 07/12/2020 to present."   ? ?This nurse faxed request to (SW) H.I.M with signed authorization of release.  [facility] will also send fax received to this office to process.   ?

## 2022-02-05 ENCOUNTER — Inpatient Hospital Stay: Payer: 59

## 2022-02-05 ENCOUNTER — Encounter: Payer: Self-pay | Admitting: Hematology and Oncology

## 2022-02-05 ENCOUNTER — Other Ambulatory Visit: Payer: Self-pay

## 2022-02-05 ENCOUNTER — Inpatient Hospital Stay: Payer: 59 | Attending: Hematology and Oncology | Admitting: Hematology and Oncology

## 2022-02-05 VITALS — BP 120/75 | HR 60 | Temp 98.7°F | Resp 16

## 2022-02-05 VITALS — BP 131/76 | HR 60 | Temp 97.2°F | Resp 16 | Wt 227.3 lb

## 2022-02-05 DIAGNOSIS — Z171 Estrogen receptor negative status [ER-]: Secondary | ICD-10-CM

## 2022-02-05 DIAGNOSIS — C773 Secondary and unspecified malignant neoplasm of axilla and upper limb lymph nodes: Secondary | ICD-10-CM | POA: Insufficient documentation

## 2022-02-05 DIAGNOSIS — Z79899 Other long term (current) drug therapy: Secondary | ICD-10-CM | POA: Insufficient documentation

## 2022-02-05 DIAGNOSIS — C50812 Malignant neoplasm of overlapping sites of left female breast: Secondary | ICD-10-CM

## 2022-02-05 DIAGNOSIS — G629 Polyneuropathy, unspecified: Secondary | ICD-10-CM | POA: Diagnosis not present

## 2022-02-05 DIAGNOSIS — Z7901 Long term (current) use of anticoagulants: Secondary | ICD-10-CM | POA: Diagnosis not present

## 2022-02-05 DIAGNOSIS — Z5112 Encounter for antineoplastic immunotherapy: Secondary | ICD-10-CM | POA: Diagnosis present

## 2022-02-05 DIAGNOSIS — Z95828 Presence of other vascular implants and grafts: Secondary | ICD-10-CM

## 2022-02-05 DIAGNOSIS — Z86718 Personal history of other venous thrombosis and embolism: Secondary | ICD-10-CM | POA: Insufficient documentation

## 2022-02-05 LAB — CMP (CANCER CENTER ONLY)
ALT: 15 U/L (ref 0–44)
AST: 18 U/L (ref 15–41)
Albumin: 3.8 g/dL (ref 3.5–5.0)
Alkaline Phosphatase: 50 U/L (ref 38–126)
Anion gap: 7 (ref 5–15)
BUN: 44 mg/dL — ABNORMAL HIGH (ref 8–23)
CO2: 27 mmol/L (ref 22–32)
Calcium: 9.6 mg/dL (ref 8.9–10.3)
Chloride: 103 mmol/L (ref 98–111)
Creatinine: 1.15 mg/dL — ABNORMAL HIGH (ref 0.44–1.00)
GFR, Estimated: 54 mL/min — ABNORMAL LOW (ref >60)
Glucose, Bld: 132 mg/dL — ABNORMAL HIGH (ref 70–99)
Potassium: 3.5 mmol/L (ref 3.5–5.1)
Sodium: 137 mmol/L (ref 135–145)
Total Bilirubin: 0.4 mg/dL (ref 0.3–1.2)
Total Protein: 8.4 g/dL — ABNORMAL HIGH (ref 6.5–8.1)

## 2022-02-05 LAB — CBC WITH DIFFERENTIAL (CANCER CENTER ONLY)
Abs Immature Granulocytes: 0.02 10*3/uL (ref 0.00–0.07)
Basophils Absolute: 0 10*3/uL (ref 0.0–0.1)
Basophils Relative: 0 %
Eosinophils Absolute: 0.1 10*3/uL (ref 0.0–0.5)
Eosinophils Relative: 1 %
HCT: 32.5 % — ABNORMAL LOW (ref 36.0–46.0)
Hemoglobin: 10.4 g/dL — ABNORMAL LOW (ref 12.0–15.0)
Immature Granulocytes: 0 %
Lymphocytes Relative: 27 %
Lymphs Abs: 1.5 10*3/uL (ref 0.7–4.0)
MCH: 28.4 pg (ref 26.0–34.0)
MCHC: 32 g/dL (ref 30.0–36.0)
MCV: 88.8 fL (ref 80.0–100.0)
Monocytes Absolute: 0.7 10*3/uL (ref 0.1–1.0)
Monocytes Relative: 13 %
Neutro Abs: 3.3 10*3/uL (ref 1.7–7.7)
Neutrophils Relative %: 59 %
Platelet Count: 200 10*3/uL (ref 150–400)
RBC: 3.66 MIL/uL — ABNORMAL LOW (ref 3.87–5.11)
RDW: 14.6 % (ref 11.5–15.5)
WBC Count: 5.6 10*3/uL (ref 4.0–10.5)
nRBC: 0 % (ref 0.0–0.2)

## 2022-02-05 MED ORDER — SODIUM CHLORIDE 0.9% FLUSH
10.0000 mL | INTRAVENOUS | Status: DC | PRN
  Administered 2022-02-05: 10 mL

## 2022-02-05 MED ORDER — TRASTUZUMAB-ANNS CHEMO 150 MG IV SOLR
600.0000 mg | Freq: Once | INTRAVENOUS | Status: AC
  Administered 2022-02-05: 600 mg via INTRAVENOUS
  Filled 2022-02-05: qty 28.57

## 2022-02-05 MED ORDER — SODIUM CHLORIDE 0.9 % IV SOLN: Freq: Once | INTRAVENOUS | Status: AC

## 2022-02-05 MED ORDER — DIPHENHYDRAMINE HCL 25 MG PO CAPS
25.0000 mg | ORAL_CAPSULE | Freq: Once | ORAL | Status: AC
  Administered 2022-02-05: 25 mg via ORAL
  Filled 2022-02-05: qty 1

## 2022-02-05 MED ORDER — SODIUM CHLORIDE 0.9% FLUSH
10.0000 mL | Freq: Once | INTRAVENOUS | Status: AC
  Administered 2022-02-05: 10 mL

## 2022-02-05 MED ORDER — ACETAMINOPHEN 325 MG PO TABS
650.0000 mg | ORAL_TABLET | Freq: Once | ORAL | Status: AC
  Administered 2022-02-05: 650 mg via ORAL
  Filled 2022-02-05: qty 2

## 2022-02-05 MED ORDER — HEPARIN SOD (PORK) LOCK FLUSH 100 UNIT/ML IV SOLN
500.0000 [IU] | Freq: Once | INTRAVENOUS | Status: AC | PRN
  Administered 2022-02-05: 500 [IU]

## 2022-02-05 NOTE — Patient Instructions (Signed)
[facility]  Discharge Instructions: °Thank you for choosing [facility] to provide your Young and hematology care.  ° °If you have a lab appointment with the Cancer Center, please go directly to the Cancer Center and check in at the registration area. °  °Wear comfortable clothing and clothing appropriate for easy access to any Portacath or PICC line.  ° °We strive to give you quality time with your provider. You may need to reschedule your appointment if you arrive late (15 or more minutes).  Arriving late affects you and other patients whose appointments are after yours.  Also, if you miss three or more appointments without notifying the office, you may be dismissed from the clinic at the provider’s discretion.    °  °For prescription refill requests, have your pharmacy contact our office and allow 72 hours for refills to be completed.   ° °Today you received the following chemotherapy and/or immunotherapy agents: Kanjinti °  °To help prevent nausea and vomiting after your treatment, we encourage you to take your nausea medication as directed. ° °BELOW ARE SYMPTOMS THAT SHOULD BE REPORTED IMMEDIATELY: °*FEVER GREATER THAN 100.4 F (38 °C) OR HIGHER °*CHILLS OR SWEATING °*NAUSEA AND VOMITING THAT IS NOT CONTROLLED WITH YOUR NAUSEA MEDICATION °*UNUSUAL SHORTNESS OF BREATH °*UNUSUAL BRUISING OR BLEEDING °*URINARY PROBLEMS (pain or burning when urinating, or frequent urination) °*BOWEL PROBLEMS (unusual diarrhea, constipation, pain near the anus) °TENDERNESS IN MOUTH AND THROAT WITH OR WITHOUT PRESENCE OF ULCERS (sore throat, sores in mouth, or a toothache) °UNUSUAL RASH, SWELLING OR PAIN  °UNUSUAL VAGINAL DISCHARGE OR ITCHING  ° °Items with * indicate a potential emergency and should be followed up as soon as possible or go to the Emergency Department if any problems should occur. ° °Please show the CHEMOTHERAPY ALERT CARD or IMMUNOTHERAPY ALERT CARD at check-in to the  Emergency Department and triage nurse. ° °Should you have questions after your visit or need to cancel or reschedule your appointment, please contact [name]  Dept: [phone]  and follow the prompts.  Office hours are 8:00 a.m. to 4:30 p.m. Monday - Friday. Please note that voicemails left after 4:00 p.m. may not be returned until the following business day.  We are closed weekends and major holidays. You have access to a nurse at all times for urgent questions. Please call the main number to the clinic Dept: [phone] and follow the prompts. ° ° °For any non-urgent questions, you may also contact your provider using MyChart. We now offer e-Visits for anyone 18 and older to request care online for non-urgent symptoms. For details visit [email or web address]. °  °Also download the MyChart app! Go to the app store, search "MyChart", open the app, select [facility], and log in with your MyChart username and password. ° °Due to Covid, a mask is required upon entering the hospital/clinic. If you do not have a mask, one will be given to you upon arrival. For doctor visits, patients may have 1 support person aged 18 or older with them. For treatment visits, patients cannot have anyone with them due to current Covid guidelines and our immunocompromised population.  ° °

## 2022-02-05 NOTE — Progress Notes (Signed)
Per Dr. Chryl Heck OK to trt w/ ECHO from 11/03/21 EF 55-60%. ?Per Dr. Chryl Heck no Retacrit today d/t Pt's Hgb 10.4 ?

## 2022-02-05 NOTE — Progress Notes (Signed)
ID: [name]   DOB: [birth year]  MR#: [id number]  KGM#:[id number] ? ? ?Patient Care Team: ?Jacqueline Borg, Young as PCP - General ?Young, Jacqueline Dad, Young (Inactive) as Consulting Physician (Hematology and Oncology) ?Jacqueline Arabian, Young as Consulting Physician (Orthopedic Surgery) ?Jacqueline Amber, Young as Consulting Physician (Gynecologic Oncology) ?Jacqueline Mesa, Young as Consulting Physician (General Surgery) ?Jacqueline Germany, RN as Oncology Nurse Navigator ?Jacqueline Kaufmann, RN as Oncology Nurse Navigator ?Jacqueline Dresser, Young as Consulting Physician (Cardiology) ?Jacqueline Pall, Young as Consulting Physician (Neurosurgery) ?Jacqueline Brod, Young as Consulting Physician (Orthopedic Surgery) ?Jacqueline Berthold, DO as Consulting Physician (Neurology) ?Jacqueline Merino, Young as Consulting Physician (Rheumatology) ?Jacqueline Nunnery, Young (Inactive) as Consulting Physician (Otolaryngology) ?[name]: ? ?CHIEF COMPLAINT:  metastatic breast cancer, estrogen receptor negative ? ?CURRENT TREATMENT: herceptin; rivaroxaban/Xarelto; retacrit ? ?INTERVAL HISTORY: ? ?[name] returns today for follow up and treatment of her metastatic breast cancer.   ?Last PET/CT in February 2023 with no findings suspicious for recurrent hypermetabolic metastatic disease, no recurrent hypermetabolic dermal surgical site in left axillary and outer left breast, left adrenal 1 cm nodule stable in size since baseline.  PET/CT study and has demonstrated variable mild hypermetabolic seen throughout all previous PET/CT studies favoring a benign left adrenal adenoma, mildly prominent bilateral external iliac and inguinal lymph nodes stable in size and demonstrate low-level uptake below the mediastinal blood pool favoring benign reactive nodes, aortic atherosclerosis ?She is currently on herceptin alone. ?She also gets retacrit for anemia. ? ?She has follow up with [name] and ECHO in April. ?She she is here for follow-up before her Herceptin.  She once again  mentions the difficulties with her neuropathy, cramping in her upper extremities and how dysfunctional she is.  She cannot even do her dishes because her hands started cramping up.  She has a follow-up with the neurosurgeon first week of May and she is very much looking forward to this appointment.  She is also taking gabapentin as instructed by neurology, she appears to have been confused with instructions. ?Besides neuropathy, she has gained about 25 pounds of weight and feels a bit short of breath with exertion but otherwise denies any new complaints.  No chest pain or chest pressure. Rest of the pertinent 10 point ROS reviewed and negative ? ?REVIEW OF SYSTEMS: ?Review of Systems  ?Constitutional:  Positive for fatigue. Negative for appetite change, chills, fever and unexpected weight change.  ?HENT:   Negative for hearing loss, lump/mass, mouth sores and trouble swallowing.   ?Eyes:  Negative for eye problems and icterus.  ?Respiratory:  Negative for chest tightness, cough and shortness of breath.   ?Cardiovascular:  Negative for chest pain, leg swelling and palpitations.  ?Gastrointestinal:  Negative for abdominal distention, abdominal pain, constipation, diarrhea, nausea and vomiting.  ?Endocrine: Negative for hot flashes.  ?Genitourinary:  Negative for difficulty urinating.   ?Musculoskeletal:  Positive for gait problem (walks with cane due to right foot drop). Negative for arthralgias.  ?Skin:  Negative for itching and rash.  ?Neurological:  Positive for extremity weakness (chronic right foot drop), gait problem (walks with cane due to right foot drop) and numbness. Negative for dizziness and headaches.  ?Hematological:  Negative for adenopathy. Does not bruise/bleed easily.  ?Psychiatric/Behavioral:  Negative for depression. The patient is not nervous/anxious.   ? ?  ?COVID 19 VACCINATION STATUS: Status post [place] x2, most recently April 2021 ? ? ?HISTORY OF LEFT BREAST CANCER: ?From the original intake  note: ? ? ?  Oncology History Overview Note  ?A: INVASIVE DUCTAL CARCINOMA LEFT BREAST ?(1)  status post left lumpectomy and sentinel lymph node dissection April of 2012 for a T1b N1(mic) stage IB invasive ductal carcinoma, grade 1, estrogen receptor 82% and progesterone receptor 92% positive, with no HER-2 amplification, and an MIB-1-1 of 17%,  ? ?(2)  The patient's Oncotype DX score of 21 predicted a 13% risk of distant recurrence after 5 years of tamoxifen. ? ?(3)  status post radiation completed August of 2012,  ? ?(4)  on tamoxifen from September of 2012 to April 2014 ? ?(5) the plan had been to initiate anastrozole in April 2014, but the patient had a menstrual cycle in May 2014, and resumed tamoxifen. ? (a) discontinued tamoxifen on her own initiative June 2015 because of "aches and pains". ? (b) resumed tamoxifen December 2015, discontinued February 2016 at patient's discretion ? ?(6) morbid obesity: s/p [employer] program; considering bariattric surgery ? ?B: ENDOMETRIAL CANCER ?(7) S/P laparoscopic hysterectomy with bilateral salpingo-oophorectomy and sentinel lymph node biopsy 06/16/2016 for a pT1a pN0, grade 1 endometrioid carcinoma ? 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 ?(9) definitive surgery delayed (see discussion in 11/03/2019 note) ? ? ?[name] had bilateral diagnostic mammography at [facility] on 04/27/2019 with a complaint of left breast cramping and soreness.  This has been present approximately a year.  The study found a new 3.5 cm area of focal asymmetry with amorphous calcification in the left breast upper outer quadrant.  Left breast ultrasonography on the same day found a 2.7 cm region with indistinct margins which was slightly hypoechoic.  This was palpable as a mass in the upper outer aspect of the breast. ? ?Biopsy of this area obtained 04/28/2019 20?2  found ([id number]) ductal carcinoma in situ, grade 3, estrogen and progesterone receptor negative. ? ?She met with surgery and plastics and Jacqueline. [name] recommended mastectomy.  Jacqueline. [name] suggested late reconstruction.  She saw me on 06/02/2019 and I set her up for genetics testing and agreed with mastectomy.  We also discussed weight loss management issues at that time.  Genetics testing was done and showed no pathogenic mutations. ? ?However surgery was not performed.  She tells me she was not called back but also admits "it is partly my fault" since she had mixed feelings about the surgery and she herself did not follow-up with her doctors to get a definitive plan.  She had an appointment here on 09/04/2019 which she canceled. ? ?Instead the next note I have in the record after August 2020 is from Jacqueline. [name] dated 09/22/2019.  He confirmed a palpable mass in the left upper outer quadrant at 2:00 measuring about 2.5 cm.  There was no nipple retraction or skin dimpling.  He palpated a mass in the left axilla.  He again discussed mastectomy with the patient but he also set her up for left diagnostic mammography at [facility], performed 10/25/2019.  In the breast there are pleomorphic calcifications associated with the prior biopsy clip sites and a new 0.5 cm mass surrounding the coil clip at 2:00.  In addition there were 2 new enlarged abnormal left axillary lymph nodes.  Ultrasound-guided biopsy was obtained 10/30/2019 and showed ([id number]) invasive mammary carcinoma, grade 3. Prognostic indicators significant for: ER, 80% positive with weak staining intensity and PR, 0% negative. Proliferation marker Ki67 at 70%. HER2 positive (  3+). ? ?(10) genetics testing 06/14/2019 through the Multi-Gene Panel offered by [facility] found no deleterious mutations in AIP, ALK, APC, ATM, AXIN2,BAP1,  BARD1, BLM, BMPR1A, BRCA1, BRCA2, BRIP1, CASR, CDC73, CDH1, CDK4, CDKN1B, CDKN1C, CDKN2A (p14ARF), CDKN2A (p16INK4a), CEBPA, CHEK2,  CTNNA1, DICER1, DIS3L2, EGFR (c.2369C>T, p.Thr790Met variant only), EPCAM (Deletion/duplication testing only), FH, FLCN, GATA2, GPC3, GREM1 (Promoter region deletion/duplication testing only), HOXB13 (c.251G>A,

## 2022-02-06 ENCOUNTER — Telehealth (HOSPITAL_COMMUNITY): Payer: Self-pay | Admitting: Vascular Surgery

## 2022-02-06 NOTE — Telephone Encounter (Signed)
Lvm to make f/u w echo w/ db ?

## 2022-02-11 DIAGNOSIS — M4316 Spondylolisthesis, lumbar region: Secondary | ICD-10-CM | POA: Insufficient documentation

## 2022-02-11 DIAGNOSIS — M545 Low back pain, unspecified: Secondary | ICD-10-CM | POA: Insufficient documentation

## 2022-02-11 DIAGNOSIS — G629 Polyneuropathy, unspecified: Secondary | ICD-10-CM | POA: Insufficient documentation

## 2022-02-26 ENCOUNTER — Inpatient Hospital Stay: Payer: 59 | Attending: Hematology and Oncology

## 2022-02-26 ENCOUNTER — Encounter: Payer: Self-pay | Admitting: *Deleted

## 2022-02-26 ENCOUNTER — Inpatient Hospital Stay: Payer: 59

## 2022-02-26 ENCOUNTER — Other Ambulatory Visit: Payer: Self-pay

## 2022-02-26 VITALS — BP 124/72 | HR 61 | Temp 98.1°F | Resp 20 | Ht 59.0 in | Wt 231.8 lb

## 2022-02-26 DIAGNOSIS — Z171 Estrogen receptor negative status [ER-]: Secondary | ICD-10-CM | POA: Diagnosis not present

## 2022-02-26 DIAGNOSIS — D6481 Anemia due to antineoplastic chemotherapy: Secondary | ICD-10-CM | POA: Diagnosis not present

## 2022-02-26 DIAGNOSIS — Z7901 Long term (current) use of anticoagulants: Secondary | ICD-10-CM | POA: Diagnosis not present

## 2022-02-26 DIAGNOSIS — Z90710 Acquired absence of both cervix and uterus: Secondary | ICD-10-CM | POA: Diagnosis not present

## 2022-02-26 DIAGNOSIS — Z95828 Presence of other vascular implants and grafts: Secondary | ICD-10-CM

## 2022-02-26 DIAGNOSIS — C50812 Malignant neoplasm of overlapping sites of left female breast: Secondary | ICD-10-CM | POA: Diagnosis present

## 2022-02-26 DIAGNOSIS — C773 Secondary and unspecified malignant neoplasm of axilla and upper limb lymph nodes: Secondary | ICD-10-CM | POA: Diagnosis present

## 2022-02-26 DIAGNOSIS — Z8542 Personal history of malignant neoplasm of other parts of uterus: Secondary | ICD-10-CM | POA: Insufficient documentation

## 2022-02-26 DIAGNOSIS — Z90722 Acquired absence of ovaries, bilateral: Secondary | ICD-10-CM | POA: Diagnosis not present

## 2022-02-26 DIAGNOSIS — Z9079 Acquired absence of other genital organ(s): Secondary | ICD-10-CM | POA: Diagnosis not present

## 2022-02-26 DIAGNOSIS — Z5112 Encounter for antineoplastic immunotherapy: Secondary | ICD-10-CM | POA: Insufficient documentation

## 2022-02-26 DIAGNOSIS — Z86718 Personal history of other venous thrombosis and embolism: Secondary | ICD-10-CM | POA: Insufficient documentation

## 2022-02-26 LAB — CBC WITH DIFFERENTIAL (CANCER CENTER ONLY)
Abs Immature Granulocytes: 0.01 10*3/uL (ref 0.00–0.07)
Basophils Absolute: 0 10*3/uL (ref 0.0–0.1)
Basophils Relative: 0 %
Eosinophils Absolute: 0.1 10*3/uL (ref 0.0–0.5)
Eosinophils Relative: 2 %
HCT: 29.9 % — ABNORMAL LOW (ref 36.0–46.0)
Hemoglobin: 9.7 g/dL — ABNORMAL LOW (ref 12.0–15.0)
Immature Granulocytes: 0 %
Lymphocytes Relative: 28 %
Lymphs Abs: 1.3 10*3/uL (ref 0.7–4.0)
MCH: 29.2 pg (ref 26.0–34.0)
MCHC: 32.4 g/dL (ref 30.0–36.0)
MCV: 90.1 fL (ref 80.0–100.0)
Monocytes Absolute: 0.6 10*3/uL (ref 0.1–1.0)
Monocytes Relative: 12 %
Neutro Abs: 2.8 10*3/uL (ref 1.7–7.7)
Neutrophils Relative %: 58 %
Platelet Count: 197 10*3/uL (ref 150–400)
RBC: 3.32 MIL/uL — ABNORMAL LOW (ref 3.87–5.11)
RDW: 14.6 % (ref 11.5–15.5)
WBC Count: 4.8 10*3/uL (ref 4.0–10.5)
nRBC: 0 % (ref 0.0–0.2)

## 2022-02-26 LAB — CMP (CANCER CENTER ONLY)
ALT: 15 U/L (ref 0–44)
AST: 18 U/L (ref 15–41)
Albumin: 3.7 g/dL (ref 3.5–5.0)
Alkaline Phosphatase: 44 U/L (ref 38–126)
Anion gap: 5 (ref 5–15)
BUN: 30 mg/dL — ABNORMAL HIGH (ref 8–23)
CO2: 28 mmol/L (ref 22–32)
Calcium: 9.1 mg/dL (ref 8.9–10.3)
Chloride: 108 mmol/L (ref 98–111)
Creatinine: 0.96 mg/dL (ref 0.44–1.00)
GFR, Estimated: >60 mL/min (ref >60)
Glucose, Bld: 133 mg/dL — ABNORMAL HIGH (ref 70–99)
Potassium: 3.9 mmol/L (ref 3.5–5.1)
Sodium: 141 mmol/L (ref 135–145)
Total Bilirubin: 0.4 mg/dL (ref 0.3–1.2)
Total Protein: 8.1 g/dL (ref 6.5–8.1)

## 2022-02-26 MED ORDER — DIPHENHYDRAMINE HCL 25 MG PO CAPS
25.0000 mg | ORAL_CAPSULE | Freq: Once | ORAL | Status: AC
  Administered 2022-02-26: 25 mg via ORAL
  Filled 2022-02-26: qty 1

## 2022-02-26 MED ORDER — ACETAMINOPHEN 325 MG PO TABS
650.0000 mg | ORAL_TABLET | Freq: Once | ORAL | Status: AC
  Administered 2022-02-26: 650 mg via ORAL
  Filled 2022-02-26: qty 2

## 2022-02-26 MED ORDER — SODIUM CHLORIDE 0.9% FLUSH
10.0000 mL | INTRAVENOUS | Status: DC | PRN
  Administered 2022-02-26: 10 mL

## 2022-02-26 MED ORDER — SODIUM CHLORIDE 0.9% FLUSH
10.0000 mL | Freq: Once | INTRAVENOUS | Status: AC
  Administered 2022-02-26: 10 mL

## 2022-02-26 MED ORDER — HEPARIN SOD (PORK) LOCK FLUSH 100 UNIT/ML IV SOLN
500.0000 [IU] | Freq: Once | INTRAVENOUS | Status: AC | PRN
  Administered 2022-02-26: 500 [IU]

## 2022-02-26 MED ORDER — SODIUM CHLORIDE 0.9 % IV SOLN: Freq: Once | INTRAVENOUS | Status: AC

## 2022-02-26 MED ORDER — TRASTUZUMAB-ANNS CHEMO 150 MG IV SOLR
600.0000 mg | Freq: Once | INTRAVENOUS | Status: AC
  Administered 2022-02-26: 600 mg via INTRAVENOUS
  Filled 2022-02-26: qty 28.57

## 2022-02-26 MED ORDER — EPOETIN ALFA-EPBX 40000 UNIT/ML IJ SOLN: 40000.0000 [IU] | Freq: Once | INTRAMUSCULAR | Status: DC

## 2022-02-26 MED ORDER — EPOETIN ALFA-EPBX 40000 UNIT/ML IJ SOLN
40000.0000 [IU] | Freq: Once | INTRAMUSCULAR | Status: AC
  Administered 2022-02-26: 40000 [IU] via SUBCUTANEOUS
  Filled 2022-02-26: qty 1

## 2022-02-26 NOTE — Progress Notes (Signed)
Per Dr. Chryl Heck, ok for treatment today with ECHO from 11/03/21. Pt. has ECHO scheduled 03/04/22. ?

## 2022-02-26 NOTE — Patient Instructions (Addendum)
[facility]  Discharge Instructions: ?Thank you for choosing [facility] to provide your oncology and hematology care.  ? ?If you have a lab appointment with the [facility], please go directly to the [facility] and check in at the registration area. ?  ?Wear comfortable clothing and clothing appropriate for easy access to any Portacath or PICC line.  ? ?We strive to give you quality time with your provider. You may need to reschedule your appointment if you arrive late (15 or more minutes).  Arriving late affects you and other patients whose appointments are after yours.  Also, if you miss three or more appointments without notifying the office, you may be dismissed from the clinic at the provider?s discretion.    ?  ?For prescription refill requests, have your pharmacy contact our office and allow 72 hours for refills to be completed.   ? ?Today you received the following chemotherapy and/or immunotherapy agent: Trastuzumab (Kanjinti). ?  ?To help prevent nausea and vomiting after your treatment, we encourage you to take your nausea medication as directed. ? ?BELOW ARE SYMPTOMS THAT SHOULD BE REPORTED IMMEDIATELY: ?*FEVER GREATER THAN 100.4 F (38 ?C) OR HIGHER ?*CHILLS OR SWEATING ?*NAUSEA AND VOMITING THAT IS NOT CONTROLLED WITH YOUR NAUSEA MEDICATION ?*UNUSUAL SHORTNESS OF BREATH ?*UNUSUAL BRUISING OR BLEEDING ?*URINARY PROBLEMS (pain or burning when urinating, or frequent urination) ?*BOWEL PROBLEMS (unusual diarrhea, constipation, pain near the anus) ?TENDERNESS IN MOUTH AND THROAT WITH OR WITHOUT PRESENCE OF ULCERS (sore throat, sores in mouth, or a toothache) ?UNUSUAL RASH, SWELLING OR PAIN  ?UNUSUAL VAGINAL DISCHARGE OR ITCHING  ? ?Items with * indicate a potential emergency and should be followed up as soon as possible or go to the Emergency Department if any problems should occur. ? ?Please show the CHEMOTHERAPY ALERT CARD or IMMUNOTHERAPY ALERT CARD at  check-in to the Emergency Department and triage nurse. ? ?Should you have questions after your visit or need to cancel or reschedule your appointment, please contact [facility]  Dept: [phone]  and follow the prompts.  Office hours are 8:00 a.m. to 4:30 p.m. Monday - Friday. Please note that voicemails left after 4:00 p.m. may not be returned until the following business day.  We are closed weekends and major holidays. You have access to a nurse at all times for urgent questions. Please call the main number to the clinic Dept: [phone] and follow the prompts. ? ? ?For any non-urgent questions, you may also contact your provider using MyChart. We now offer e-Visits for anyone 37 and older to request care online for non-urgent symptoms. For details visit [email or web address]. ?  ?Also download the MyChart app! Go to the app store, search "MyChart", open the app, select [facility], and log in with your MyChart username and password. ? ?Due to Covid, a mask is required upon entering the hospital/clinic. If you do not have a mask, one will be given to you upon arrival. For doctor visits, patients may have 1 support person aged 102 or older with them. For treatment visits, patients cannot have anyone with them due to current Covid guidelines and our immunocompromised population.  ? ?Epoetin Alfa injection ?What is this medication? ?EPOETIN ALFA (e POE e tin AL fa) helps your body make more red blood cells. This medicine is used to treat anemia caused by chronic kidney disease, cancer chemotherapy, or HIV-therapy. It may also be used before surgery if you have anemia. ?This  medicine may be used for other purposes; ask your health care provider or pharmacist if you have questions. ?COMMON BRAND NAME(S): Epogen, Procrit, Retacrit ?What should I tell my care team before I take this medication? ?They need to know if you have any of these conditions: ?cancer ?heart disease ?high blood  pressure ?history of blood clots ?history of stroke ?low levels of folate, iron, or vitamin B12 in the blood ?seizures ?an unusual or allergic reaction to erythropoietin, albumin, benzyl alcohol, hamster proteins, other medicines, foods, dyes, or preservatives ?pregnant or trying to get pregnant ?breast-feeding ?How should I use this medication? ?This medicine is for injection into a vein or under the skin. It is usually given by a health care professional in a hospital or clinic setting. ?If you get this medicine at home, you will be taught how to prepare and give this medicine. Use exactly as directed. Take your medicine at regular intervals. Do not take your medicine more often than directed. ?It is important that you put your used needles and syringes in a special sharps container. Do not put them in a trash can. If you do not have a sharps container, call your pharmacist or healthcare provider to get one. ?A special MedGuide will be given to you by the pharmacist with each prescription and refill. Be sure to read this information carefully each time. ?Talk to your pediatrician regarding the use of this medicine in children. While this drug may be prescribed for selected conditions, precautions do apply. ?Overdosage: If you think you have taken too much of this medicine contact a poison control center or emergency room at once. ?NOTE: This medicine is only for you. Do not share this medicine with others. ?What if I miss a dose? ?If you miss a dose, take it as soon as you can. If it is almost time for your next dose, take only that dose. Do not take double or extra doses. ?What may interact with this medication? ?Interactions have not been studied. ?This list may not describe all possible interactions. Give your health care provider a list of all the medicines, herbs, non-prescription drugs, or dietary supplements you use. Also tell them if you smoke, drink alcohol, or use illegal drugs. Some items may interact  with your medicine. ?What should I watch for while using this medication? ?Your condition will be monitored carefully while you are receiving this medicine. ?You may need blood work done while you are taking this medicine. ?This medicine may cause a decrease in vitamin B6. You should make sure that you get enough vitamin B6 while you are taking this medicine. Discuss the foods you eat and the vitamins you take with your health care professional. ?What side effects may I notice from receiving this medication? ?Side effects that you should report to your doctor or health care professional as soon as possible: ?allergic reactions like skin rash, itching or hives, swelling of the face, lips, or tongue ?seizures ?signs and symptoms of a blood clot such as breathing problems; changes in vision; chest pain; severe, sudden headache; pain, swelling, warmth in the leg; trouble speaking; sudden numbness or weakness of the face, arm or leg ?signs and symptoms of a stroke like changes in vision; confusion; trouble speaking or understanding; severe headaches; sudden numbness or weakness of the face, arm or leg; trouble walking; dizziness; loss of balance or coordination ?Side effects that usually do not require medical attention (report to your doctor or health care professional if they continue or are  bothersome): ?chills ?cough ?dizziness ?fever ?headaches ?joint pain ?muscle cramps ?muscle pain ?nausea, vomiting ?pain, redness, or irritation at site where injected ?This list may not describe all possible side effects. Call your doctor for medical advice about side effects. You may report side effects to FDA at 1-800-FDA-1088. ?Where should I keep my medication? ?Keep out of the reach of children. ?Store in a refrigerator between 2 and 8 degrees C (36 and 46 degrees F). Do not freeze or shake. Throw away any unused portion if using a single-dose vial. Multi-dose vials can be kept in the refrigerator for up to 21 days after the  initial dose. Throw away unused medicine. ?NOTE: This sheet is a summary. It may not cover all possible information. If you have questions about this medicine, talk to your doctor, pharmacist, or health c

## 2022-02-26 NOTE — Addendum Note (Signed)
Addended by: Wynona Neat on: 02/26/2022 08:36 AM ? ? Modules accepted: Orders ? ?

## 2022-03-04 ENCOUNTER — Encounter (HOSPITAL_COMMUNITY): Payer: Self-pay | Admitting: Cardiology

## 2022-03-04 ENCOUNTER — Ambulatory Visit (HOSPITAL_BASED_OUTPATIENT_CLINIC_OR_DEPARTMENT_OTHER)
Admission: RE | Admit: 2022-03-04 | Discharge: 2022-03-04 | Disposition: A | Discharge status: Home / Self Care | Payer: 59 | Source: Ambulatory Visit | Attending: Cardiology | Admitting: Cardiology

## 2022-03-04 ENCOUNTER — Ambulatory Visit (HOSPITAL_COMMUNITY)
Admission: RE | Admit: 2022-03-04 | Discharge: 2022-03-04 | Disposition: A | Discharge status: Home / Self Care | Payer: 59 | Source: Ambulatory Visit | Attending: Cardiology | Admitting: Cardiology

## 2022-03-04 VITALS — BP 138/80 | HR 58 | Wt 233.0 lb

## 2022-03-04 DIAGNOSIS — E1142 Type 2 diabetes mellitus with diabetic polyneuropathy: Secondary | ICD-10-CM | POA: Insufficient documentation

## 2022-03-04 DIAGNOSIS — C50112 Malignant neoplasm of central portion of left female breast: Secondary | ICD-10-CM | POA: Diagnosis not present

## 2022-03-04 DIAGNOSIS — Z853 Personal history of malignant neoplasm of breast: Secondary | ICD-10-CM | POA: Insufficient documentation

## 2022-03-04 DIAGNOSIS — I110 Hypertensive heart disease with heart failure: Secondary | ICD-10-CM | POA: Insufficient documentation

## 2022-03-04 DIAGNOSIS — Z86718 Personal history of other venous thrombosis and embolism: Secondary | ICD-10-CM | POA: Diagnosis not present

## 2022-03-04 DIAGNOSIS — Z171 Estrogen receptor negative status [ER-]: Secondary | ICD-10-CM

## 2022-03-04 DIAGNOSIS — T451X5A Adverse effect of antineoplastic and immunosuppressive drugs, initial encounter: Secondary | ICD-10-CM | POA: Diagnosis not present

## 2022-03-04 DIAGNOSIS — Z8542 Personal history of malignant neoplasm of other parts of uterus: Secondary | ICD-10-CM | POA: Insufficient documentation

## 2022-03-04 DIAGNOSIS — E669 Obesity, unspecified: Secondary | ICD-10-CM | POA: Insufficient documentation

## 2022-03-04 DIAGNOSIS — G5600 Carpal tunnel syndrome, unspecified upper limb: Secondary | ICD-10-CM | POA: Insufficient documentation

## 2022-03-04 DIAGNOSIS — Z7901 Long term (current) use of anticoagulants: Secondary | ICD-10-CM | POA: Diagnosis not present

## 2022-03-04 DIAGNOSIS — Z8249 Family history of ischemic heart disease and other diseases of the circulatory system: Secondary | ICD-10-CM | POA: Diagnosis not present

## 2022-03-04 DIAGNOSIS — Z803 Family history of malignant neoplasm of breast: Secondary | ICD-10-CM | POA: Diagnosis not present

## 2022-03-04 DIAGNOSIS — Z79899 Other long term (current) drug therapy: Secondary | ICD-10-CM | POA: Insufficient documentation

## 2022-03-04 DIAGNOSIS — I5032 Chronic diastolic (congestive) heart failure: Secondary | ICD-10-CM

## 2022-03-04 DIAGNOSIS — Z833 Family history of diabetes mellitus: Secondary | ICD-10-CM | POA: Diagnosis not present

## 2022-03-04 DIAGNOSIS — I427 Cardiomyopathy due to drug and external agent: Secondary | ICD-10-CM | POA: Diagnosis not present

## 2022-03-04 DIAGNOSIS — E785 Hyperlipidemia, unspecified: Secondary | ICD-10-CM | POA: Insufficient documentation

## 2022-03-04 LAB — ECHOCARDIOGRAM COMPLETE
Area-P 1/2: 3.63 cm2
S' Lateral: 3.5 cm
Single Plane A4C EF: 56.6 %

## 2022-03-04 MED ORDER — TELMISARTAN-HCTZ 80-12.5 MG PO TABS: 1.0000 | ORAL_TABLET | Freq: Every day | ORAL | 3 refills | Status: DC

## 2022-03-04 MED ORDER — RIVAROXABAN 20 MG PO TABS: 20.0000 mg | ORAL_TABLET | Freq: Every day | ORAL | 3 refills | Status: DC

## 2022-03-04 MED ORDER — FUROSEMIDE 40 MG PO TABS: 40.0000 mg | ORAL_TABLET | Freq: Every day | ORAL | 3 refills | Status: DC

## 2022-03-04 MED ORDER — POTASSIUM CHLORIDE CRYS ER 20 MEQ PO TBCR: 20.0000 meq | EXTENDED_RELEASE_TABLET | Freq: Every day | ORAL | 3 refills | Status: DC

## 2022-03-04 NOTE — Progress Notes (Signed)
Oncology: Dr. Chryl Heck ? ?66 y.o. with history of DM, HTN, hyperlipidemia, endometrial cancer, and breast cancer was referred by Dr. Jana Hakim for cardio-oncology evaluation.  Initial breast cancer diagnosis was in 2012.  She had left breast lumpectomy and radiation.  Recurrence in 7/20, ER+/PR-/HER2+.  She had chemotherapy with trastuzumab/pertuzumab/carboplatin/docetaxol x 6 starting 2/21.  With fall in EF in 4/21, Herceptin was held.  Echo in 4/21 showed EF 45-50%, Coreg was started. Echo in 6/21 showed EF back up to 55-60%, and Herceptin was restarted.  In 6/21, she had lumpectomy.  Echo in 7/21 showed EF stable at 55-60%.   ? ?Echo in 9/21 showed EF 55-60% with normal RV, strain images did not track well.  Coronary CTA was done in 9/21, showing coronary artery calcium score 0 and no significant CAD.   ? ?PET in 9/21 with new lymphadenopathy.  She was started on TDM-1/Kadcyla but later changed back to Herceptin which will continue for long-term.  ? ?In 12/21, she had a cervical discectomy and fusion.  ? ?Echo in 2/22 showed EF 55% with GLS -10.7% but poor endocardial tracing. Normal RV.  Echo in 4/22 showed EF 50-55%, mild LVH, normal RV, GLS -14.3% but poor endocardial tracing. Echo in 7/22 showed EF 55-60%, mild LVH, mild LAE, GLS -20.1%, normal RV, PASP 32 mmHg.  Echo in 10/22 showed EF 55-60%, mild LVH, GLS -20.1%, normal RV. Echo was done today and reviewed, EF 55-60% with GLS -22.1%, normal RV.  ? ?Patient returns for followup of diastolic CHF.  Main complaint continues to be pain and burning in hands and feet consistent with neuropathy.  She has contractures in her fingers and foot drop.  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  It is hard for her to exercise much due to neuropathy and poor balance.  No dyspnea with ADLs.  No chest pain.  Weight is up 13 lbs.  ? ?Labs (5/21): K 4.1, creatinine 0.89 ?Labs (7/21): K 4.2, creatinine 0.95 ?Labs (8/21): LDL 81, BNP 36 ?Labs (9/21): K  3.7, creatinine 0.94 ?Labs (1/22): K 3.2, creatinine 1.14 ?Labs (4/22): K 4.6, creatinine 1.02 ?Labs (5/22): negative serum and urine immunofixation ?Labs (6/22): ESR 87, ANA negative, ANCA negative, CK 225, hgb 10.2, LDL 110, K 3.7, creatinine 1.38 ?Labs (10/22): K 3.7, creatinine 1.28 ?Labs (5/23): K 3.9, creatinine 0.96 ? ?PMH: ?1. HTN ?2. Diabetes ?3. Endometrial carcinoma: 8/17 diagnosis, had hysterectomy and BSO.  ?4. Hyperlipidemia ?5. Breast cancer: Initial diagnosis in 2012.  Had left breast lumpectomy and radiation.  Recurrence in 7/20, ER+/PR-/HER2+.  She had chemotherapy with trastuzumab/pertuzumab/carboplatin/docetaxol x 6 starting 2/21.  With fall in EF in 4/21, Herceptin was held but later restarted.  Lumpectomy 6/21.  ?- Echo (1/21): EF 60-65%, GLS -20%, normal RV.  ?- Echo (4/21): EF 45-50%, global hypokinesis, GLS -12.6%, mildly decreased RV function.  ?- Echo (6/21): EF 55-60%, GLS -14.4%, mild LVH, PASP 38, RV normal.  ?- Echo (7/21): EF 55-60%, mild LVH, GLS -16.9%, RV  Normal ?- Echo (9/21): EF 49-82%, grade 2 diastolic dysfunction, GLS -11.6% (poor endocardial tracking), normal RV, normal IVC.  ?- Echo (2/22): EF 55%, GLS -10.7% (poor endocardial tracking), normal RV.  ?- Echo (4/22): EF 50-55%, mild LVH, normal RV, GLS -14.3% but poor endocardial tracing. ?- Echo (7/22): EF 55-60%, mild LVH, mild LAE, GLS -20.1%, normal RV, PASP 32 mmHg.  ?- Echo (10/22): EF 55-60%, mild LVH, GLS -20.1%, normal RV.  ?- Echo (5/23): EF 55-60%  with GLS -22.1%, normal RV.  ?6. Coronary calcium score scan (8/21): CAC 0 Agatston units.  ?- Coronary CTA (9/21): CAC 0 Agatston units.  No significant CAD.  ?7. DVT: Left leg, 8/21.  ?8. Cervical discectomy and fusion in 12/21.  ?9. Peripheral neuropathy ?10. Carpal tunnel syndrome ? ?Social History  ? ?Socioeconomic History  ? Marital status: Divorced  ?  Spouse name: Not on file  ? Number of children: 0  ? Years of education: Not on file  ? Highest education level:  Not on file  ?Occupational History  ? Occupation: Education officer, museum  ?  Employer: [employer]  ?  Comment: retired  ?Tobacco Use  ? Smoking status: Never  ? Smokeless tobacco: Never  ?Vaping Use  ? Vaping Use: Never used  ?Substance and Sexual Activity  ? Alcohol use: Not Currently  ? Drug use: No  ? Sexual activity: Not Currently  ?Other Topics Concern  ? Not on file  ?Social History Narrative  ? 1 son deceased with homicide  ? Patient has been divorced twice  ? Right handed  ? Drinks caffeine  ? One story   ? ?Social Determinants of Health  ? ?Financial Resource Strain: Not on file  ?Food Insecurity: Not on file  ?Transportation Needs: Not on file  ?Physical Activity: Not on file  ?Stress: Not on file  ?Social Connections: Not on file  ?Intimate Partner Violence: Not on file  ? ?Family History  ?Problem Relation Age of Onset  ? Diabetes Mother   ? Hypertension Mother   ? Heart disease Father   ?     COPD  ? Alcohol abuse Father   ?     ETOH dependence  ? Breast cancer Maternal Aunt   ?     dx in her 63s  ? Lung cancer Maternal Uncle   ? Breast cancer Paternal Aunt   ? Cancer Maternal Grandmother   ?     salivary gland cancer  ? Colon cancer Neg Hx   ? ?ROS: All systems reviewed and negative except as per HPI.  ? ?Current Outpatient Medications  ?Medication Sig Dispense Refill  ? carvedilol (COREG) 6.25 MG tablet TAKE 1 TABLET BY MOUTH TWICE A DAY 60 tablet 11  ? gabapentin (NEURONTIN) 300 MG capsule Take 2 capsules (600 mg total) by mouth 3 (three) times daily. 480 capsule 1  ? glucose blood (ONE TOUCH ULTRA TEST) test strip 1 each by Other route 2 (two) times daily. Use to check blood sugars twice a day Dx E11.9 100 each 0  ? Lancets (ONETOUCH ULTRASOFT) lancets 1 each by Other route 2 (two) times daily. Use to check blood sugars twice a day Dx E11.9 100 each 0  ? lidocaine-prilocaine (EMLA) cream 4 gram tid prn 120 g 5  ? omeprazole (PRILOSEC) 40 MG capsule Take 40 mg by mouth daily as needed (Heartburn).    ?  pravastatin (PRAVACHOL) 40 MG tablet TAKE 1 TABLET BY MOUTH EVERY DAY 30 tablet 11  ? silver sulfADIAZINE (SILVADENE) 1 % cream Apply 1 application topically as needed.    ? furosemide (LASIX) 40 MG tablet Take 1 tablet (40 mg total) by mouth daily. 90 tablet 3  ? potassium chloride SA (KLOR-CON M20) 20 MEQ tablet Take 1 tablet (20 mEq total) by mouth daily. 90 tablet 3  ? rivaroxaban (XARELTO) 20 MG TABS tablet Take 1 tablet (20 mg total) by mouth daily with supper. 90 tablet 3  ? telmisartan-hydrochlorothiazide (MICARDIS  HCT) 80-12.5 MG tablet Take 1 tablet by mouth daily. 90 tablet 3  ? ?No current facility-administered medications for this encounter.  ? ?Facility-Administered Medications Ordered in Other Encounters  ?Medication Dose Route Frequency Provider Last Rate Last Admin  ? cloNIDine (CATAPRES) tablet 0.1 mg  0.1 mg Oral Daily Harle Stanford., PA-C   0.1 mg at 11/21/19 1742  ? ?BP 138/80   Pulse (!) 58   Wt 105.7 kg (233 lb)   LMP 05/18/2016   SpO2 100%   BMI 47.06 kg/m?  ?General: NAD ?Neck: No JVD, no thyromegaly or thyroid nodule.  ?Lungs: Clear to auscultation bilaterally with normal respiratory effort. ?CV: Nondisplaced PMI.  Heart regular S1/S2, no S3/S4, no murmur.  1+ ankle edema.  No carotid bruit.  Normal pedal pulses.  ?Abdomen: Soft, nontender, no hepatosplenomegaly, no distention.  ?Skin: Intact without lesions or rashes.  ?Neurologic: Alert and oriented x 3.  ?Psych: Normal affect. ?Extremities: No clubbing or cyanosis.  ?HEENT: Normal.  ? ?Assessment/Plan: ?1. Chemotherapy-related cardiomyopathy: Fall in EF and strain in the setting of Herceptin use.  Echo in 4/21 with EF 45-50% with GLS -12.6%.  Herceptin was held, Coreg started, and echo in 6/21 showed EF up to 55-60% with GLS -14.4% => Herceptin restarted.  Echo in 7/21 with EF 55-60%, GLS more negative at -16.9%.  Echo in 2/22 and in 9/21 showed normal EF but less negative strain.  However, strain images did not appear to track well  on either echo.  Echo in 4/22  showed EF 50-55%, again with poor strain tracking.  Echo in 7/22 and in 10/22 showed EF 55-60% with GLS -20.1% and mild LVH. Echo today showed EF 55-60% with GLS -22.1%, normal R

## 2022-03-04 NOTE — Patient Instructions (Signed)
Refills for your medications has been sent to the pharmacy ? ?You have been referred to the pharmacy clinic for weight loss medication, they will call you to discuss ? ?Your physician recommends that you schedule a follow-up appointment in: 6 months with echocardiogram (November 2023), **PLEASE CALL OUR OFFICE IN September TO SCHEDULE THIS APPOINTMENT ? ?If you have any questions or concerns before your next appointment please send [place] a message through [place] or call our office at [phone].   ? ?TO LEAVE A MESSAGE FOR THE NURSE SELECT OPTION 2, PLEASE LEAVE A MESSAGE INCLUDING: ?YOUR NAME ?DATE OF BIRTH ?CALL BACK NUMBER ?REASON FOR CALL**this is important as we prioritize the call backs ? ?YOU WILL RECEIVE A CALL BACK THE SAME DAY AS LONG AS YOU CALL BEFORE 4:00 PM ? ?At the [facility], you and your health needs are our priority. As part of our continuing mission to provide you with exceptional heart care, we have created designated Provider Care Teams. These Care Teams include your primary Cardiologist (physician) and Advanced Practice Providers (APPs- Physician Assistants and Nurse Practitioners) who all work together to provide you with the care you need, when you need it.  ? ?You may see any of the following providers on your designated Care Team at your next follow up: ?Dr Glori Bickers ?Dr Loralie Champagne ?Darrick Grinder, NP ?Lyda Jester, PA ?Jessica Milford,NP ?Marlyce Huge, PA ?Audry Riles, PharmD ? ? ?Please be sure to bring in all your medications bottles to every appointment.  ? ? ?

## 2022-03-19 ENCOUNTER — Inpatient Hospital Stay: Payer: 59

## 2022-03-19 ENCOUNTER — Inpatient Hospital Stay: Payer: 59 | Attending: Hematology and Oncology | Admitting: Hematology and Oncology

## 2022-03-19 ENCOUNTER — Other Ambulatory Visit: Payer: Self-pay

## 2022-03-19 VITALS — BP 146/90 | HR 66 | Temp 98.1°F | Wt 235.1 lb

## 2022-03-19 DIAGNOSIS — Z171 Estrogen receptor negative status [ER-]: Secondary | ICD-10-CM

## 2022-03-19 DIAGNOSIS — Z7901 Long term (current) use of anticoagulants: Secondary | ICD-10-CM | POA: Diagnosis not present

## 2022-03-19 DIAGNOSIS — C773 Secondary and unspecified malignant neoplasm of axilla and upper limb lymph nodes: Secondary | ICD-10-CM | POA: Insufficient documentation

## 2022-03-19 DIAGNOSIS — Z90710 Acquired absence of both cervix and uterus: Secondary | ICD-10-CM | POA: Diagnosis not present

## 2022-03-19 DIAGNOSIS — Z95828 Presence of other vascular implants and grafts: Secondary | ICD-10-CM

## 2022-03-19 DIAGNOSIS — Z79899 Other long term (current) drug therapy: Secondary | ICD-10-CM | POA: Insufficient documentation

## 2022-03-19 DIAGNOSIS — Z90722 Acquired absence of ovaries, bilateral: Secondary | ICD-10-CM | POA: Insufficient documentation

## 2022-03-19 DIAGNOSIS — C50812 Malignant neoplasm of overlapping sites of left female breast: Secondary | ICD-10-CM | POA: Insufficient documentation

## 2022-03-19 DIAGNOSIS — C50112 Malignant neoplasm of central portion of left female breast: Secondary | ICD-10-CM

## 2022-03-19 DIAGNOSIS — Z5112 Encounter for antineoplastic immunotherapy: Secondary | ICD-10-CM | POA: Insufficient documentation

## 2022-03-19 DIAGNOSIS — Z9079 Acquired absence of other genital organ(s): Secondary | ICD-10-CM | POA: Insufficient documentation

## 2022-03-19 DIAGNOSIS — T451X5A Adverse effect of antineoplastic and immunosuppressive drugs, initial encounter: Secondary | ICD-10-CM | POA: Insufficient documentation

## 2022-03-19 DIAGNOSIS — Z86718 Personal history of other venous thrombosis and embolism: Secondary | ICD-10-CM | POA: Insufficient documentation

## 2022-03-19 DIAGNOSIS — Z8542 Personal history of malignant neoplasm of other parts of uterus: Secondary | ICD-10-CM | POA: Diagnosis not present

## 2022-03-19 DIAGNOSIS — D6481 Anemia due to antineoplastic chemotherapy: Secondary | ICD-10-CM | POA: Diagnosis not present

## 2022-03-19 LAB — CBC WITH DIFFERENTIAL (CANCER CENTER ONLY)
Abs Immature Granulocytes: 0.02 10*3/uL (ref 0.00–0.07)
Basophils Absolute: 0 10*3/uL (ref 0.0–0.1)
Basophils Relative: 0 %
Eosinophils Absolute: 0.1 10*3/uL (ref 0.0–0.5)
Eosinophils Relative: 2 %
HCT: 30.7 % — ABNORMAL LOW (ref 36.0–46.0)
Hemoglobin: 9.8 g/dL — ABNORMAL LOW (ref 12.0–15.0)
Immature Granulocytes: 0 %
Lymphocytes Relative: 25 %
Lymphs Abs: 1.3 10*3/uL (ref 0.7–4.0)
MCH: 28.9 pg (ref 26.0–34.0)
MCHC: 31.9 g/dL (ref 30.0–36.0)
MCV: 90.6 fL (ref 80.0–100.0)
Monocytes Absolute: 0.6 10*3/uL (ref 0.1–1.0)
Monocytes Relative: 12 %
Neutro Abs: 3.1 10*3/uL (ref 1.7–7.7)
Neutrophils Relative %: 61 %
Platelet Count: 184 10*3/uL (ref 150–400)
RBC: 3.39 MIL/uL — ABNORMAL LOW (ref 3.87–5.11)
RDW: 14.5 % (ref 11.5–15.5)
WBC Count: 5.2 10*3/uL (ref 4.0–10.5)
nRBC: 0 % (ref 0.0–0.2)

## 2022-03-19 LAB — CMP (CANCER CENTER ONLY)
ALT: 13 U/L (ref 0–44)
AST: 17 U/L (ref 15–41)
Albumin: 3.8 g/dL (ref 3.5–5.0)
Alkaline Phosphatase: 46 U/L (ref 38–126)
Anion gap: 6 (ref 5–15)
BUN: 31 mg/dL — ABNORMAL HIGH (ref 8–23)
CO2: 27 mmol/L (ref 22–32)
Calcium: 9.5 mg/dL (ref 8.9–10.3)
Chloride: 105 mmol/L (ref 98–111)
Creatinine: 0.95 mg/dL (ref 0.44–1.00)
GFR, Estimated: >60 mL/min (ref >60)
Glucose, Bld: 137 mg/dL — ABNORMAL HIGH (ref 70–99)
Potassium: 3.8 mmol/L (ref 3.5–5.1)
Sodium: 138 mmol/L (ref 135–145)
Total Bilirubin: 0.4 mg/dL (ref 0.3–1.2)
Total Protein: 7.8 g/dL (ref 6.5–8.1)

## 2022-03-19 MED ORDER — SODIUM CHLORIDE 0.9% FLUSH
10.0000 mL | INTRAVENOUS | Status: DC | PRN
  Administered 2022-03-19: 10 mL

## 2022-03-19 MED ORDER — DIPHENHYDRAMINE HCL 25 MG PO CAPS
25.0000 mg | ORAL_CAPSULE | Freq: Once | ORAL | Status: AC
  Administered 2022-03-19: 25 mg via ORAL
  Filled 2022-03-19: qty 1

## 2022-03-19 MED ORDER — TRASTUZUMAB-ANNS CHEMO 150 MG IV SOLR
6.0000 mg/kg | Freq: Once | INTRAVENOUS | Status: AC
  Administered 2022-03-19: 609 mg via INTRAVENOUS
  Filled 2022-03-19: qty 29

## 2022-03-19 MED ORDER — SODIUM CHLORIDE 0.9 % IV SOLN: Freq: Once | INTRAVENOUS | Status: AC

## 2022-03-19 MED ORDER — EPOETIN ALFA-EPBX 40000 UNIT/ML IJ SOLN
40000.0000 [IU] | Freq: Once | INTRAMUSCULAR | Status: AC
  Administered 2022-03-19: 40000 [IU] via SUBCUTANEOUS
  Filled 2022-03-19: qty 1

## 2022-03-19 MED ORDER — SODIUM CHLORIDE 0.9% FLUSH
10.0000 mL | Freq: Once | INTRAVENOUS | Status: AC
  Administered 2022-03-19: 10 mL

## 2022-03-19 MED ORDER — ACETAMINOPHEN 325 MG PO TABS
650.0000 mg | ORAL_TABLET | Freq: Once | ORAL | Status: AC
  Administered 2022-03-19: 650 mg via ORAL
  Filled 2022-03-19: qty 2

## 2022-03-19 MED ORDER — HEPARIN SOD (PORK) LOCK FLUSH 100 UNIT/ML IV SOLN
500.0000 [IU] | Freq: Once | INTRAVENOUS | Status: AC | PRN
  Administered 2022-03-19: 500 [IU]

## 2022-03-19 NOTE — Progress Notes (Signed)
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 Echocardiogram examinations, most                 recent 11/03/2021. Risk Factors:Hypertension, Diabetes and                 Dyslipidemia.  Sonographer:    Mikki Santee RDCS Referring Phys: [place]  1. Left ventricular ejection fraction, by estimation, is 55 to 60%. The left ventricle has normal function. The left ventricle has no regional wall motion abnormalities. Left ventricular diastolic parameters are consistent with Grade II diastolic dysfunction (pseudonormalization). The average left ventricular global longitudinal strain is -22.1 %. The global longitudinal strain is normal.  2. Right ventricular systolic function is normal. The right ventricular size is normal. There is normal pulmonary artery systolic pressure. The estimated right ventricular systolic pressure is 41.9 mmHg.  3. Left atrial size was mildly dilated.  4. The mitral valve is normal in structure. Mild mitral valve regurgitation. No evidence of mitral stenosis.  5. The  aortic valve is tricuspid. Aortic valve regurgitation is not visualized. No aortic stenosis is present.  6. Aortic dilatation noted. There is mild dilatation of the ascending aorta, measuring 40 mm.  7. The inferior vena cava is normal in size with greater than 50% respiratory variability, suggesting right atrial pressure of 3 mmHg. FINDINGS  Left Ventricle: Left ventricular ejection fraction, by estimation, is 55 to 60%. The left ventricle has normal function. The left ventricle has no regional wall motion abnormalities. The average left ventricular global longitudinal strain is -22.1 %. The global longitudinal strain is normal. The left ventricular internal cavity size was normal in size. There is no left ventricular hypertrophy. Left ventricular diastolic parameters are consistent with Grade II diastolic dysfunction (pseudonormalization). Right Ventricle: The right ventricular size is normal. No increase in right ventricular wall thickness. Right ventricular systolic function is normal. There is normal pulmonary artery systolic pressure. The tricuspid regurgitant velocity is 2.63 m/s, and  with an assumed right atrial pressure of 3 mmHg, the estimated right ventricular systolic pressure is 62.2 mmHg. Left Atrium: Left atrial size was mildly dilated. Right Atrium: Right atrial size was normal in size. Pericardium: There is no evidence of pericardial effusion. Mitral Valve: The mitral valve is normal in structure. Mild mitral valve regurgitation. No evidence of mitral valve stenosis. Tricuspid Valve: The tricuspid valve is normal in structure. Tricuspid valve regurgitation is mild. Aortic Valve: The aortic valve is tricuspid. Aortic valve regurgitation is not visualized. No aortic stenosis is present. Pulmonic Valve: The pulmonic valve was normal in structure. Pulmonic valve regurgitation is not visualized. Aorta: The aortic root is normal in size and structure and aortic dilatation noted. There is mild dilatation  of the ascending aorta, measuring 40 mm. Venous: The inferior vena cava is normal in size with greater than 50% respiratory variability, suggesting right atrial pressure of 3 mmHg.  IAS/Shunts: No atrial level shunt detected by color flow Doppler.  LEFT VENTRICLE PLAX 2D LVIDd:         5.20 cm     Diastology LVIDs:         3.50 cm     LV e' medial:    6.81 cm/s LV PW:         1.00 cm     LV E/e' medial:  12.5 LV IVS:        0.60 cm     LV e' lateral:   8.68 cm/s LVOT diam:     2.10 cm     LV E/e' lateral: 9.8 LV SV:         69 LV SV Index:   35          2D Longitudinal Strain LVOT Area:     3.46 cm    2D Strain GLS Avg:     -22.1 %  LV Volumes (MOD) LV vol d, MOD A4C: 73.3 ml LV vol s, MOD A4C: 31.8 ml LV SV MOD A4C:     73.3 ml RIGHT VENTRICLE RV S prime:     10.80 cm/s TAPSE (M-mode): 1.9 cm LEFT ATRIUM             Index        RIGHT ATRIUM           Index LA diam:        3.20 cm 1.63 cm/m   RA Area:     14.40 cm LA Vol (A2C):   58.2 ml 29.64 ml/m  RA Volume:   41.70 ml  21.24 ml/m LA Vol (A4C):   66.5 ml 33.87 ml/m LA Biplane Vol: 63.2 ml 32.19 ml/m  AORTIC VALVE LVOT Vmax:   81.30 cm/s LVOT Vmean:  60.900 cm/s LVOT VTI:    0.198 m  AORTA Ao Root diam: 3.10 cm Ao Asc diam:  4.00 cm MITRAL VALVE               TRICUSPID VALVE MV Area (PHT): 3.63 cm    TR Peak grad:   27.7 mmHg MV Decel Time: 209 msec    TR Vmax:        263.00 cm/s MV E velocity: 85.40 cm/s MV A velocity: 82.50 cm/s  SHUNTS MV E/A ratio:  1.04        Systemic VTI:  0.20 m                            Systemic Diam: 2.10 cm Dalton McleanMD Electronically signed by Franki Monte Signature Date/Time: 03/04/2022/12:38:17 PM    Final      ASSESSMENT:  66 y.o.  [place] woman with metastatic HER2 amplified breast cancer currently on single agent Herceptin, please refer to the above-mentioned history who is here for follow-up. She is doing well on single agent Herceptin.  No signs of cardiac compromise.  Last ECHO in 02/2022 showing EF of  55-60%. Seen Dr Aundra Dubin , ok to continue herceptin. 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 No physical examination findings concerning for recurrence Hemoglobin under 10 today, ok to proceed with retacrit. Weight gain of 30 lbs from over eating according to patient, advised portion control, hydration. She is also interested in taking some medication, she talked to Dr Aundra Dubin about it. PLAN: Total time spent: 30 minutes  Benay Pike, MD Medical Oncology and Hematology [place]  [place], [place] [address] Tel. [phone]    Fax. [phone]   *Total Encounter Time as defined by the Centers for Medicare and Medicaid Services includes, in addition to the face-to-face time of a patient visit (documented in the note above) non-face-to-face time: obtaining and reviewing outside history, ordering and reviewing medications, tests or procedures, care coordination (communications with other health care professionals or caregivers) and documentation in the medical record.

## 2022-03-19 NOTE — Patient Instructions (Signed)
[facility]  Discharge Instructions: Thank you for choosing [facility] to provide your oncology and hematology care.   If you have a lab appointment with the [facility], please go directly to the [facility] and check in at the registration area.   Wear comfortable clothing and clothing appropriate for easy access to any Portacath or PICC line.   We strive to give you quality time with your provider. You may need to reschedule your appointment if you arrive late (15 or more minutes).  Arriving late affects you and other patients whose appointments are after yours.  Also, if you miss three or more appointments without notifying the office, you may be dismissed from the clinic at the provider's discretion.      For prescription refill requests, have your pharmacy contact our office and allow 72 hours for refills to be completed.    Today you received the following chemotherapy and/or immunotherapy agents: Kanjinti, retacrit      To help prevent nausea and vomiting after your treatment, we encourage you to take your nausea medication as directed.  BELOW ARE SYMPTOMS THAT SHOULD BE REPORTED IMMEDIATELY: *FEVER GREATER THAN 100.4 F (38 C) OR HIGHER *CHILLS OR SWEATING *NAUSEA AND VOMITING THAT IS NOT CONTROLLED WITH YOUR NAUSEA MEDICATION *UNUSUAL SHORTNESS OF BREATH *UNUSUAL BRUISING OR BLEEDING *URINARY PROBLEMS (pain or burning when urinating, or frequent urination) *BOWEL PROBLEMS (unusual diarrhea, constipation, pain near the anus) TENDERNESS IN MOUTH AND THROAT WITH OR WITHOUT PRESENCE OF ULCERS (sore throat, sores in mouth, or a toothache) UNUSUAL RASH, SWELLING OR PAIN  UNUSUAL VAGINAL DISCHARGE OR ITCHING   Items with * indicate a potential emergency and should be followed up as soon as possible or go to the Emergency Department if any problems should occur.  Please show the CHEMOTHERAPY ALERT CARD or IMMUNOTHERAPY ALERT CARD at  check-in to the Emergency Department and triage nurse.  Should you have questions after your visit or need to cancel or reschedule your appointment, please contact [facility]  Dept: [phone]  and follow the prompts.  Office hours are 8:00 a.m. to 4:30 p.m. Monday - Friday. Please note that voicemails left after 4:00 p.m. may not be returned until the following business day.  We are closed weekends and major holidays. You have access to a nurse at all times for urgent questions. Please call the main number to the clinic Dept: [phone] and follow the prompts.   For any non-urgent questions, you may also contact your provider using MyChart. We now offer e-Visits for anyone 70 and older to request care online for non-urgent symptoms. For details visit [email or web address].   Also download the MyChart app! Go to the app store, search "MyChart", open the app, select [facility], and log in with your MyChart username and password.  Due to Covid, a mask is required upon entering the hospital/clinic. If you do not have a mask, one will be given to you upon arrival. For doctor visits, patients may have 1 support person aged 41 or older with them. For treatment visits, patients cannot have anyone with them due to current Covid guidelines and our immunocompromised population.

## 2022-03-27 IMAGING — CT NM PET TUM IMG RESTAG (PS) SKULL BASE T - THIGH
1 of 7 series · 1 of 25 positions shown · non-contrast
Comparison: 06/26/2021 PET-CT.

CLINICAL DATA: Subsequent treatment strategy for stage IV left
breast cancer with ongoing immunotherapy.

EXAM:
NUCLEAR MEDICINE PET SKULL BASE TO THIGH
TECHNIQUE: 11.0 mCi F-18 FDG was injected intravenously. Full-ring PET imaging
was performed from the skull base to thigh after the radiotracer. CT
data was obtained and used for attenuation correction and anatomic
localization.
Fasting blood glucose: 114 mg/dl

[Series 4: ct sk_thigh 5.0 bf37 · axial · 5.0mm · 0.98mm/px · 1 of 198 slices shown]
[im 198/198  brain]
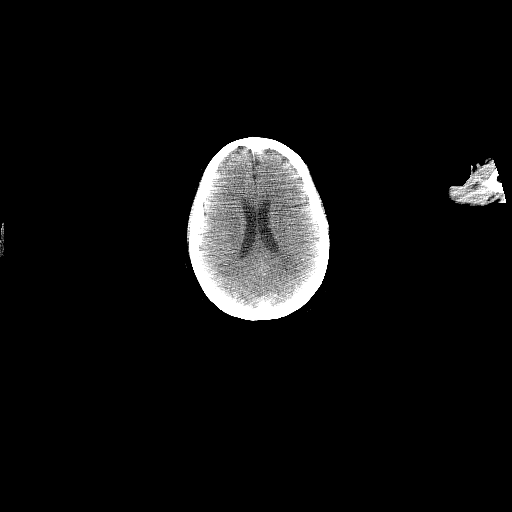

[1 of 25 positions shown; findings below may reference images not displayed]

FINDINGS: Mediastinal blood pool activity: SUV max

Liver activity: SUV max NA

NECK: No hypermetabolic lymph nodes in the neck.

Incidental CT findings: Stable subcentimeter hypodense bilateral
thyroid nodules, non hypermetabolic. Not clinically significant; no
follow-up imaging recommended (ref: [HOSPITAL]. [DATE]): 143-50).Right internal jugular Port-A-Cath terminates at
the cavoatrial junction.

CHEST:

No enlarged or hypermetabolic axillary, mediastinal or hilar lymph
nodes. No hypermetabolic pulmonary findings.

Incidental CT findings: Surgical clips and linear scarring in the
left axilla, unchanged and without recurrent hypermetabolism. Focal
soft tissue asymmetry in the outer left breast associated with
multiple surgical clips, without recurrent hypermetabolism,
compatible with post lumpectomy change. No significant pulmonary
nodules.

ABDOMEN/PELVIS: No abnormal hypermetabolic activity within the
liver, pancreas, right adrenal gland, or spleen.

Left adrenal 1.0 cm nodule with density 15 HU demonstrates mild
hypermetabolism with max SUV 5.1 (series 4/image 94), stable in size
back to baseline 11/20/2019 PET-CT study, previous max SUV 3.3, with
variable mild hypermetabolism visualized within this left adrenal
nodule on all previous PET-CT studies. Findings favor a stable
benign left adrenal adenoma.

No hypermetabolic lymph nodes in the abdomen or pelvis. Borderline
prominent bilateral external iliac and bilateral inguinal lymph
nodes are stable in size and demonstrate low level FDG uptake below
that of the mediastinal blood pool, most compatible with benign
reactive nodes. Representative 1.9 cm short axis diameter left
inguinal node with preserved fatty hilum and max SUV 3.2 (series
4/image 177), previously 2.0 cm with max SUV 4.0, not substantially
changed. Representative 0.9 cm left external iliac node with max SUV
2.7 (series 4/image 140), previously 0.9 cm with max SUV 2.1, not
substantially changed.

Incidental CT findings: Mildly atherosclerotic nonaneurysmal
abdominal aorta. Hysterectomy.

SKELETON: No focal hypermetabolic activity to suggest skeletal
metastasis.

Incidental CT findings: none
IMPRESSION: 1. No findings suspicious for recurrent hypermetabolic metastatic
disease. No recurrent hypermetabolism at the surgical sites in the
left axilla and outer left breast.
2. Left adrenal 1.0 cm nodule is stable in size since baseline
11/20/2019 PET-CT study and has demonstrated variable mild
hypermetabolism throughout all previous PET-CT studies, favoring a
benign left adrenal adenoma.
3. Mildly prominent bilateral external iliac and inguinal lymph
nodes are stable in size and demonstrate low level uptake below the
mediastinal blood pool, favoring benign reactive nodes.
4.  Aortic Atherosclerosis ([id number]).

## 2022-04-09 ENCOUNTER — Inpatient Hospital Stay: Payer: 59

## 2022-04-09 ENCOUNTER — Other Ambulatory Visit: Payer: Self-pay

## 2022-04-09 VITALS — BP 142/69 | HR 55 | Temp 97.8°F | Resp 18 | Wt 235.5 lb

## 2022-04-09 DIAGNOSIS — C50112 Malignant neoplasm of central portion of left female breast: Secondary | ICD-10-CM

## 2022-04-09 DIAGNOSIS — Z5112 Encounter for antineoplastic immunotherapy: Secondary | ICD-10-CM | POA: Diagnosis not present

## 2022-04-09 DIAGNOSIS — Z171 Estrogen receptor negative status [ER-]: Secondary | ICD-10-CM

## 2022-04-09 LAB — CMP (CANCER CENTER ONLY)
ALT: 14 U/L (ref 0–44)
AST: 18 U/L (ref 15–41)
Albumin: 3.8 g/dL (ref 3.5–5.0)
Alkaline Phosphatase: 44 U/L (ref 38–126)
Anion gap: 6 (ref 5–15)
BUN: 30 mg/dL — ABNORMAL HIGH (ref 8–23)
CO2: 29 mmol/L (ref 22–32)
Calcium: 9.6 mg/dL (ref 8.9–10.3)
Chloride: 105 mmol/L (ref 98–111)
Creatinine: 1.03 mg/dL — ABNORMAL HIGH (ref 0.44–1.00)
GFR, Estimated: >60 mL/min (ref >60)
Glucose, Bld: 128 mg/dL — ABNORMAL HIGH (ref 70–99)
Potassium: 3.5 mmol/L (ref 3.5–5.1)
Sodium: 140 mmol/L (ref 135–145)
Total Bilirubin: 0.3 mg/dL (ref 0.3–1.2)
Total Protein: 8 g/dL (ref 6.5–8.1)

## 2022-04-09 LAB — CBC WITH DIFFERENTIAL (CANCER CENTER ONLY)
Abs Immature Granulocytes: 0.01 10*3/uL (ref 0.00–0.07)
Basophils Absolute: 0 10*3/uL (ref 0.0–0.1)
Basophils Relative: 0 %
Eosinophils Absolute: 0.1 10*3/uL (ref 0.0–0.5)
Eosinophils Relative: 2 %
HCT: 32.4 % — ABNORMAL LOW (ref 36.0–46.0)
Hemoglobin: 10.5 g/dL — ABNORMAL LOW (ref 12.0–15.0)
Immature Granulocytes: 0 %
Lymphocytes Relative: 29 %
Lymphs Abs: 1.4 10*3/uL (ref 0.7–4.0)
MCH: 29.5 pg (ref 26.0–34.0)
MCHC: 32.4 g/dL (ref 30.0–36.0)
MCV: 91 fL (ref 80.0–100.0)
Monocytes Absolute: 0.5 10*3/uL (ref 0.1–1.0)
Monocytes Relative: 11 %
Neutro Abs: 2.9 10*3/uL (ref 1.7–7.7)
Neutrophils Relative %: 58 %
Platelet Count: 197 10*3/uL (ref 150–400)
RBC: 3.56 MIL/uL — ABNORMAL LOW (ref 3.87–5.11)
RDW: 14.6 % (ref 11.5–15.5)
WBC Count: 4.9 10*3/uL (ref 4.0–10.5)
nRBC: 0 % (ref 0.0–0.2)

## 2022-04-09 MED ORDER — HEPARIN SOD (PORK) LOCK FLUSH 100 UNIT/ML IV SOLN
500.0000 [IU] | Freq: Once | INTRAVENOUS | Status: AC | PRN
  Administered 2022-04-09: 500 [IU]

## 2022-04-09 MED ORDER — DIPHENHYDRAMINE HCL 25 MG PO CAPS
25.0000 mg | ORAL_CAPSULE | Freq: Once | ORAL | Status: AC
  Administered 2022-04-09: 25 mg via ORAL
  Filled 2022-04-09: qty 1

## 2022-04-09 MED ORDER — SODIUM CHLORIDE 0.9% FLUSH
10.0000 mL | INTRAVENOUS | Status: DC | PRN
  Administered 2022-04-09: 10 mL

## 2022-04-09 MED ORDER — ACETAMINOPHEN 325 MG PO TABS
650.0000 mg | ORAL_TABLET | Freq: Once | ORAL | Status: AC
  Administered 2022-04-09: 650 mg via ORAL
  Filled 2022-04-09: qty 2

## 2022-04-09 MED ORDER — TRASTUZUMAB-ANNS CHEMO 150 MG IV SOLR
6.0000 mg/kg | Freq: Once | INTRAVENOUS | Status: AC
  Administered 2022-04-09: 609 mg via INTRAVENOUS
  Filled 2022-04-09: qty 29

## 2022-04-09 MED ORDER — SODIUM CHLORIDE 0.9 % IV SOLN: Freq: Once | INTRAVENOUS | Status: AC

## 2022-04-09 NOTE — Patient Instructions (Signed)
[facility]  Discharge Instructions: °Thank you for choosing [facility] to provide your oncology and hematology care.  ° °If you have a lab appointment with the Cancer Center, please go directly to the Cancer Center and check in at the registration area. °  °Wear comfortable clothing and clothing appropriate for easy access to any Portacath or PICC line.  ° °We strive to give you quality time with your provider. You may need to reschedule your appointment if you arrive late (15 or more minutes).  Arriving late affects you and other patients whose appointments are after yours.  Also, if you miss three or more appointments without notifying the office, you may be dismissed from the clinic at the provider’s discretion.    °  °For prescription refill requests, have your pharmacy contact our office and allow 72 hours for refills to be completed.   ° °Today you received the following chemotherapy and/or immunotherapy agents: Kanjinti °  °To help prevent nausea and vomiting after your treatment, we encourage you to take your nausea medication as directed. ° °BELOW ARE SYMPTOMS THAT SHOULD BE REPORTED IMMEDIATELY: °*FEVER GREATER THAN 100.4 F (38 °C) OR HIGHER °*CHILLS OR SWEATING °*NAUSEA AND VOMITING THAT IS NOT CONTROLLED WITH YOUR NAUSEA MEDICATION °*UNUSUAL SHORTNESS OF BREATH °*UNUSUAL BRUISING OR BLEEDING °*URINARY PROBLEMS (pain or burning when urinating, or frequent urination) °*BOWEL PROBLEMS (unusual diarrhea, constipation, pain near the anus) °TENDERNESS IN MOUTH AND THROAT WITH OR WITHOUT PRESENCE OF ULCERS (sore throat, sores in mouth, or a toothache) °UNUSUAL RASH, SWELLING OR PAIN  °UNUSUAL VAGINAL DISCHARGE OR ITCHING  ° °Items with * indicate a potential emergency and should be followed up as soon as possible or go to the Emergency Department if any problems should occur. ° °Please show the CHEMOTHERAPY ALERT CARD or IMMUNOTHERAPY ALERT CARD at check-in to the  Emergency Department and triage nurse. ° °Should you have questions after your visit or need to cancel or reschedule your appointment, please contact [facility]  Dept: [phone]  and follow the prompts.  Office hours are 8:00 a.m. to 4:30 p.m. Monday - Friday. Please note that voicemails left after 4:00 p.m. may not be returned until the following business day.  We are closed weekends and major holidays. You have access to a nurse at all times for urgent questions. Please call the main number to the clinic Dept: [phone] and follow the prompts. ° ° °For any non-urgent questions, you may also contact your provider using MyChart. We now offer e-Visits for anyone 18 and older to request care online for non-urgent symptoms. For details visit [email or web address]. °  °Also download the MyChart app! Go to the app store, search "MyChart", open the app, select , and log in with your MyChart username and password. ° °Due to Covid, a mask is required upon entering the hospital/clinic. If you do not have a mask, one will be given to you upon arrival. For doctor visits, patients may have 1 support Sherril Shipman aged 18 or older with them. For treatment visits, patients cannot have anyone with them due to current Covid guidelines and our immunocompromised population.  ° °

## 2022-04-16 ENCOUNTER — Telehealth: Payer: Self-pay

## 2022-04-16 NOTE — Telephone Encounter (Signed)
Pt called to ask if/when she will need PET scan scheduled. Per MD's last note, no PET is indicated. Attempted to return pt's call and LVM for call back.

## 2022-04-23 ENCOUNTER — Encounter: Payer: Self-pay | Admitting: Hematology and Oncology

## 2022-04-24 ENCOUNTER — Other Ambulatory Visit: Payer: Self-pay | Admitting: *Deleted

## 2022-04-24 DIAGNOSIS — C50919 Malignant neoplasm of unspecified site of unspecified female breast: Secondary | ICD-10-CM

## 2022-04-24 DIAGNOSIS — Z171 Estrogen receptor negative status [ER-]: Secondary | ICD-10-CM

## 2022-04-24 DIAGNOSIS — Z8589 Personal history of malignant neoplasm of other organs and systems: Secondary | ICD-10-CM

## 2022-04-24 NOTE — Progress Notes (Signed)
Per MD review- pt is to presently have restaging with PET q 6 months.  Order placed per above and detailed message left for pt per above including phone number to central scheduling.[name]

## 2022-04-30 ENCOUNTER — Inpatient Hospital Stay (HOSPITAL_BASED_OUTPATIENT_CLINIC_OR_DEPARTMENT_OTHER): Payer: Medicare Other | Admitting: Hematology and Oncology

## 2022-04-30 ENCOUNTER — Encounter: Payer: Self-pay | Admitting: Hematology and Oncology

## 2022-04-30 ENCOUNTER — Other Ambulatory Visit: Payer: Self-pay

## 2022-04-30 ENCOUNTER — Inpatient Hospital Stay: Payer: Medicare Other | Attending: Hematology and Oncology

## 2022-04-30 ENCOUNTER — Inpatient Hospital Stay: Payer: Medicare Other

## 2022-04-30 VITALS — BP 151/75 | HR 73 | Temp 97.6°F | Resp 17

## 2022-04-30 VITALS — BP 149/85 | HR 70 | Temp 97.4°F | Resp 16 | Wt 232.5 lb

## 2022-04-30 DIAGNOSIS — Z9079 Acquired absence of other genital organ(s): Secondary | ICD-10-CM | POA: Diagnosis not present

## 2022-04-30 DIAGNOSIS — Z171 Estrogen receptor negative status [ER-]: Secondary | ICD-10-CM | POA: Diagnosis not present

## 2022-04-30 DIAGNOSIS — Z5112 Encounter for antineoplastic immunotherapy: Secondary | ICD-10-CM | POA: Insufficient documentation

## 2022-04-30 DIAGNOSIS — Z95828 Presence of other vascular implants and grafts: Secondary | ICD-10-CM

## 2022-04-30 DIAGNOSIS — C50812 Malignant neoplasm of overlapping sites of left female breast: Secondary | ICD-10-CM | POA: Diagnosis present

## 2022-04-30 DIAGNOSIS — Z79899 Other long term (current) drug therapy: Secondary | ICD-10-CM | POA: Diagnosis not present

## 2022-04-30 DIAGNOSIS — C773 Secondary and unspecified malignant neoplasm of axilla and upper limb lymph nodes: Secondary | ICD-10-CM | POA: Diagnosis present

## 2022-04-30 DIAGNOSIS — R635 Abnormal weight gain: Secondary | ICD-10-CM | POA: Diagnosis not present

## 2022-04-30 DIAGNOSIS — Z8542 Personal history of malignant neoplasm of other parts of uterus: Secondary | ICD-10-CM | POA: Diagnosis not present

## 2022-04-30 DIAGNOSIS — Z90710 Acquired absence of both cervix and uterus: Secondary | ICD-10-CM | POA: Diagnosis not present

## 2022-04-30 DIAGNOSIS — Z86718 Personal history of other venous thrombosis and embolism: Secondary | ICD-10-CM | POA: Insufficient documentation

## 2022-04-30 DIAGNOSIS — C50112 Malignant neoplasm of central portion of left female breast: Secondary | ICD-10-CM | POA: Diagnosis not present

## 2022-04-30 DIAGNOSIS — G629 Polyneuropathy, unspecified: Secondary | ICD-10-CM | POA: Diagnosis not present

## 2022-04-30 DIAGNOSIS — T451X5A Adverse effect of antineoplastic and immunosuppressive drugs, initial encounter: Secondary | ICD-10-CM | POA: Diagnosis not present

## 2022-04-30 DIAGNOSIS — D6481 Anemia due to antineoplastic chemotherapy: Secondary | ICD-10-CM | POA: Insufficient documentation

## 2022-04-30 DIAGNOSIS — Z7901 Long term (current) use of anticoagulants: Secondary | ICD-10-CM | POA: Insufficient documentation

## 2022-04-30 DIAGNOSIS — Z90722 Acquired absence of ovaries, bilateral: Secondary | ICD-10-CM | POA: Insufficient documentation

## 2022-04-30 LAB — CMP (CANCER CENTER ONLY)
ALT: 15 U/L (ref 0–44)
AST: 20 U/L (ref 15–41)
Albumin: 3.8 g/dL (ref 3.5–5.0)
Alkaline Phosphatase: 49 U/L (ref 38–126)
Anion gap: 5 (ref 5–15)
BUN: 32 mg/dL — ABNORMAL HIGH (ref 8–23)
CO2: 27 mmol/L (ref 22–32)
Calcium: 9.6 mg/dL (ref 8.9–10.3)
Chloride: 109 mmol/L (ref 98–111)
Creatinine: 1.02 mg/dL — ABNORMAL HIGH (ref 0.44–1.00)
GFR, Estimated: >60 mL/min (ref >60)
Glucose, Bld: 128 mg/dL — ABNORMAL HIGH (ref 70–99)
Potassium: 3.6 mmol/L (ref 3.5–5.1)
Sodium: 141 mmol/L (ref 135–145)
Total Bilirubin: 0.4 mg/dL (ref 0.3–1.2)
Total Protein: 8 g/dL (ref 6.5–8.1)

## 2022-04-30 LAB — CBC WITH DIFFERENTIAL (CANCER CENTER ONLY)
Abs Immature Granulocytes: 0.02 10*3/uL (ref 0.00–0.07)
Basophils Absolute: 0 10*3/uL (ref 0.0–0.1)
Basophils Relative: 0 %
Eosinophils Absolute: 0.1 10*3/uL (ref 0.0–0.5)
Eosinophils Relative: 2 %
HCT: 30.5 % — ABNORMAL LOW (ref 36.0–46.0)
Hemoglobin: 9.8 g/dL — ABNORMAL LOW (ref 12.0–15.0)
Immature Granulocytes: 0 %
Lymphocytes Relative: 23 %
Lymphs Abs: 1.4 10*3/uL (ref 0.7–4.0)
MCH: 28.7 pg (ref 26.0–34.0)
MCHC: 32.1 g/dL (ref 30.0–36.0)
MCV: 89.4 fL (ref 80.0–100.0)
Monocytes Absolute: 0.6 10*3/uL (ref 0.1–1.0)
Monocytes Relative: 9 %
Neutro Abs: 4 10*3/uL (ref 1.7–7.7)
Neutrophils Relative %: 66 %
Platelet Count: 178 10*3/uL (ref 150–400)
RBC: 3.41 MIL/uL — ABNORMAL LOW (ref 3.87–5.11)
RDW: 14.2 % (ref 11.5–15.5)
WBC Count: 6.2 10*3/uL (ref 4.0–10.5)
nRBC: 0 % (ref 0.0–0.2)

## 2022-04-30 MED ORDER — ACETAMINOPHEN 325 MG PO TABS
650.0000 mg | ORAL_TABLET | Freq: Once | ORAL | Status: AC
  Administered 2022-04-30: 650 mg via ORAL
  Filled 2022-04-30: qty 2

## 2022-04-30 MED ORDER — SODIUM CHLORIDE 0.9% FLUSH
10.0000 mL | Freq: Once | INTRAVENOUS | Status: AC
  Administered 2022-04-30: 10 mL

## 2022-04-30 MED ORDER — TRASTUZUMAB-ANNS CHEMO 150 MG IV SOLR
6.0000 mg/kg | Freq: Once | INTRAVENOUS | Status: AC
  Administered 2022-04-30: 609 mg via INTRAVENOUS
  Filled 2022-04-30: qty 29

## 2022-04-30 MED ORDER — SODIUM CHLORIDE 0.9 % IV SOLN: Freq: Once | INTRAVENOUS | Status: AC

## 2022-04-30 MED ORDER — SODIUM CHLORIDE 0.9% FLUSH
10.0000 mL | INTRAVENOUS | Status: DC | PRN
  Administered 2022-04-30: 10 mL

## 2022-04-30 MED ORDER — DIPHENHYDRAMINE HCL 25 MG PO CAPS
25.0000 mg | ORAL_CAPSULE | Freq: Once | ORAL | Status: AC
  Administered 2022-04-30: 25 mg via ORAL
  Filled 2022-04-30: qty 1

## 2022-04-30 MED ORDER — HEPARIN SOD (PORK) LOCK FLUSH 100 UNIT/ML IV SOLN
500.0000 [IU] | Freq: Once | INTRAVENOUS | Status: AC | PRN
  Administered 2022-04-30: 500 [IU]

## 2022-04-30 MED ORDER — EPOETIN ALFA-EPBX 40000 UNIT/ML IJ SOLN
40000.0000 [IU] | Freq: Once | INTRAMUSCULAR | Status: AC
  Administered 2022-04-30: 40000 [IU] via SUBCUTANEOUS
  Filled 2022-04-30: qty 1

## 2022-04-30 NOTE — Progress Notes (Signed)
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 Long term Xarelto.  RTC in 6 weeks.  Total time spent: 30 minutes  Benay Pike, MD Medical Oncology and Hematology [facility] [address] [place], [place] [address] Tel. [phone]    Fax. [phone]   *Total Encounter Time as defined by the Centers for Medicare and Medicaid Services includes, in addition to the face-to-face time of a patient visit (documented in the note above) non-face-to-face time: obtaining and reviewing outside history, ordering and reviewing medications, tests or procedures, care coordination (communications with other health care professionals or caregivers) and documentation in the medical record.

## 2022-04-30 NOTE — Patient Instructions (Signed)
[facility]  Discharge Instructions: Thank you for choosing [name] to provide your oncology and hematology care.   If you have a lab appointment with the [facility], please go directly to the [facility] and check in at the registration area.   Wear comfortable clothing and clothing appropriate for easy access to any Portacath or PICC line.   We strive to give you quality time with your provider. You may need to reschedule your appointment if you arrive late (15 or more minutes).  Arriving late affects you and other patients whose appointments are after yours.  Also, if you miss three or more appointments without notifying the office, you may be dismissed from the clinic at the provider's discretion.      For prescription refill requests, have your pharmacy contact our office and allow 72 hours for refills to be completed.    Today you received the following chemotherapy and/or immunotherapy agents: Trastuzumab      To help prevent nausea and vomiting after your treatment, we encourage you to take your nausea medication as directed.  BELOW ARE SYMPTOMS THAT SHOULD BE REPORTED IMMEDIATELY: *FEVER GREATER THAN 100.4 F (38 C) OR HIGHER *CHILLS OR SWEATING *NAUSEA AND VOMITING THAT IS NOT CONTROLLED WITH YOUR NAUSEA MEDICATION *UNUSUAL SHORTNESS OF BREATH *UNUSUAL BRUISING OR BLEEDING *URINARY PROBLEMS (pain or burning when urinating, or frequent urination) *BOWEL PROBLEMS (unusual diarrhea, constipation, pain near the anus) TENDERNESS IN MOUTH AND THROAT WITH OR WITHOUT PRESENCE OF ULCERS (sore throat, sores in mouth, or a toothache) UNUSUAL RASH, SWELLING OR PAIN  UNUSUAL VAGINAL DISCHARGE OR ITCHING   Items with * indicate a potential emergency and should be followed up as soon as possible or go to the Emergency Department if any problems should occur.  Please show the CHEMOTHERAPY ALERT CARD or IMMUNOTHERAPY ALERT CARD at check-in  to the Emergency Department and triage nurse.  Should you have questions after your visit or need to cancel or reschedule your appointment, please contact [facility]  Dept: [phone]  and follow the prompts.  Office hours are 8:00 a.m. to 4:30 p.m. Monday - Friday. Please note that voicemails left after 4:00 p.m. may not be returned until the following business day.  We are closed weekends and major holidays. You have access to a nurse at all times for urgent questions. Please call the main number to the clinic Dept: [phone] and follow the prompts.   For any non-urgent questions, you may also contact your provider using MyChart. We now offer e-Visits for anyone 18 and older to request care online for non-urgent symptoms. For details visit [email or web address].   Also download the MyChart app! Go to the app store, search "MyChart", open the app, select [facility], and log in with your MyChart username and password.  Masks are optional in the cancer centers. If you would like for your care team to wear a mask while they are taking care of you, please let them know. For doctor visits, patients may have with them one support person who is at least 64 years old. At this time, visitors are not allowed in the infusion area. 

## 2022-05-19 ENCOUNTER — Other Ambulatory Visit: Payer: Self-pay

## 2022-05-21 ENCOUNTER — Inpatient Hospital Stay: Payer: Medicare Other | Attending: Hematology and Oncology

## 2022-05-21 ENCOUNTER — Other Ambulatory Visit: Payer: Self-pay

## 2022-05-21 ENCOUNTER — Inpatient Hospital Stay: Payer: Medicare Other

## 2022-05-21 VITALS — BP 119/65 | HR 72 | Resp 17 | Wt 235.0 lb

## 2022-05-21 DIAGNOSIS — C773 Secondary and unspecified malignant neoplasm of axilla and upper limb lymph nodes: Secondary | ICD-10-CM | POA: Diagnosis present

## 2022-05-21 DIAGNOSIS — C50812 Malignant neoplasm of overlapping sites of left female breast: Secondary | ICD-10-CM

## 2022-05-21 DIAGNOSIS — Z95828 Presence of other vascular implants and grafts: Secondary | ICD-10-CM

## 2022-05-21 DIAGNOSIS — Z79899 Other long term (current) drug therapy: Secondary | ICD-10-CM | POA: Insufficient documentation

## 2022-05-21 DIAGNOSIS — Z7901 Long term (current) use of anticoagulants: Secondary | ICD-10-CM | POA: Diagnosis not present

## 2022-05-21 DIAGNOSIS — Z5112 Encounter for antineoplastic immunotherapy: Secondary | ICD-10-CM | POA: Diagnosis present

## 2022-05-21 DIAGNOSIS — C50112 Malignant neoplasm of central portion of left female breast: Secondary | ICD-10-CM

## 2022-05-21 DIAGNOSIS — Z171 Estrogen receptor negative status [ER-]: Secondary | ICD-10-CM | POA: Insufficient documentation

## 2022-05-21 LAB — CBC WITH DIFFERENTIAL (CANCER CENTER ONLY)
Abs Immature Granulocytes: 0.02 10*3/uL (ref 0.00–0.07)
Basophils Absolute: 0 10*3/uL (ref 0.0–0.1)
Basophils Relative: 1 %
Eosinophils Absolute: 0.1 10*3/uL (ref 0.0–0.5)
Eosinophils Relative: 2 %
HCT: 31.2 % — ABNORMAL LOW (ref 36.0–46.0)
Hemoglobin: 9.9 g/dL — ABNORMAL LOW (ref 12.0–15.0)
Immature Granulocytes: 0 %
Lymphocytes Relative: 27 %
Lymphs Abs: 1.4 10*3/uL (ref 0.7–4.0)
MCH: 28.6 pg (ref 26.0–34.0)
MCHC: 31.7 g/dL (ref 30.0–36.0)
MCV: 90.2 fL (ref 80.0–100.0)
Monocytes Absolute: 0.6 10*3/uL (ref 0.1–1.0)
Monocytes Relative: 11 %
Neutro Abs: 3.1 10*3/uL (ref 1.7–7.7)
Neutrophils Relative %: 59 %
Platelet Count: 226 10*3/uL (ref 150–400)
RBC: 3.46 MIL/uL — ABNORMAL LOW (ref 3.87–5.11)
RDW: 14.7 % (ref 11.5–15.5)
WBC Count: 5.2 10*3/uL (ref 4.0–10.5)
nRBC: 0 % (ref 0.0–0.2)

## 2022-05-21 LAB — CMP (CANCER CENTER ONLY)
ALT: 16 U/L (ref 0–44)
AST: 18 U/L (ref 15–41)
Albumin: 3.8 g/dL (ref 3.5–5.0)
Alkaline Phosphatase: 46 U/L (ref 38–126)
Anion gap: 6 (ref 5–15)
BUN: 34 mg/dL — ABNORMAL HIGH (ref 8–23)
CO2: 29 mmol/L (ref 22–32)
Calcium: 9 mg/dL (ref 8.9–10.3)
Chloride: 104 mmol/L (ref 98–111)
Creatinine: 1 mg/dL (ref 0.44–1.00)
GFR, Estimated: >60 mL/min (ref >60)
Glucose, Bld: 136 mg/dL — ABNORMAL HIGH (ref 70–99)
Potassium: 3.6 mmol/L (ref 3.5–5.1)
Sodium: 139 mmol/L (ref 135–145)
Total Bilirubin: 0.3 mg/dL (ref 0.3–1.2)
Total Protein: 8 g/dL (ref 6.5–8.1)

## 2022-05-21 MED ORDER — SODIUM CHLORIDE 0.9 % IV SOLN: Freq: Once | INTRAVENOUS | Status: AC

## 2022-05-21 MED ORDER — ACETAMINOPHEN 325 MG PO TABS
650.0000 mg | ORAL_TABLET | Freq: Once | ORAL | Status: AC
  Administered 2022-05-21: 650 mg via ORAL
  Filled 2022-05-21: qty 2

## 2022-05-21 MED ORDER — SODIUM CHLORIDE 0.9% FLUSH
10.0000 mL | Freq: Once | INTRAVENOUS | Status: AC
  Administered 2022-05-21: 10 mL

## 2022-05-21 MED ORDER — HEPARIN SOD (PORK) LOCK FLUSH 100 UNIT/ML IV SOLN
500.0000 [IU] | Freq: Once | INTRAVENOUS | Status: AC | PRN
  Administered 2022-05-21: 500 [IU]

## 2022-05-21 MED ORDER — SODIUM CHLORIDE 0.9% FLUSH
10.0000 mL | INTRAVENOUS | Status: DC | PRN
  Administered 2022-05-21: 10 mL

## 2022-05-21 MED ORDER — TRASTUZUMAB-ANNS CHEMO 150 MG IV SOLR
6.0000 mg/kg | Freq: Once | INTRAVENOUS | Status: AC
  Administered 2022-05-21: 609 mg via INTRAVENOUS
  Filled 2022-05-21: qty 29

## 2022-05-21 MED ORDER — DIPHENHYDRAMINE HCL 25 MG PO CAPS
25.0000 mg | ORAL_CAPSULE | Freq: Once | ORAL | Status: AC
  Administered 2022-05-21: 25 mg via ORAL
  Filled 2022-05-21: qty 1

## 2022-05-21 NOTE — Patient Instructions (Signed)
[facility]  Discharge Instructions: Thank you for choosing [facility] to provide your oncology and hematology care.   If you have a lab appointment with the Cancer Center, please go directly to the Cancer Center and check in at the registration area.   Wear comfortable clothing and clothing appropriate for easy access to any Portacath or PICC line.   We strive to give you quality time with your provider. You may need to reschedule your appointment if you arrive late (15 or more minutes).  Arriving late affects you and other patients whose appointments are after yours.  Also, if you miss three or more appointments without notifying the office, you may be dismissed from the clinic at the provider's discretion.      For prescription refill requests, have your pharmacy contact our office and allow 72 hours for refills to be completed.    Today you received the following chemotherapy and/or immunotherapy agents: Trastuzumab      To help prevent nausea and vomiting after your treatment, we encourage you to take your nausea medication as directed.  BELOW ARE SYMPTOMS THAT SHOULD BE REPORTED IMMEDIATELY: *FEVER GREATER THAN 100.4 F (38 C) OR HIGHER *CHILLS OR SWEATING *NAUSEA AND VOMITING THAT IS NOT CONTROLLED WITH YOUR NAUSEA MEDICATION *UNUSUAL SHORTNESS OF BREATH *UNUSUAL BRUISING OR BLEEDING *URINARY PROBLEMS (pain or burning when urinating, or frequent urination) *BOWEL PROBLEMS (unusual diarrhea, constipation, pain near the anus) TENDERNESS IN MOUTH AND THROAT WITH OR WITHOUT PRESENCE OF ULCERS (sore throat, sores in mouth, or a toothache) UNUSUAL RASH, SWELLING OR PAIN  UNUSUAL VAGINAL DISCHARGE OR ITCHING   Items with * indicate a potential emergency and should be followed up as soon as possible or go to the Emergency Department if any problems should occur.  Please show the CHEMOTHERAPY ALERT CARD or IMMUNOTHERAPY ALERT CARD at check-in  to the Emergency Department and triage nurse.  Should you have questions after your visit or need to cancel or reschedule your appointment, please contact [facility]  Dept: [phone]  and follow the prompts.  Office hours are 8:00 a.m. to 4:30 p.m. Monday - Friday. Please note that voicemails left after 4:00 p.m. may not be returned until the following business day.  We are closed weekends and major holidays. You have access to a nurse at all times for urgent questions. Please call the main number to the clinic Dept: [phone] and follow the prompts.   For any non-urgent questions, you may also contact your provider using MyChart. We now offer e-Visits for anyone 18 and older to request care online for non-urgent symptoms. For details visit [email or web address].   Also download the MyChart app! Go to the app store, search "MyChart", open the app, select [facility], and log in with your MyChart username and password.  Masks are optional in the cancer centers. If you would like for your care team to wear a mask while they are taking care of you, please let them know. For doctor visits, patients may have with them one support person who is at least 64 years old. At this time, visitors are not allowed in the infusion area. 

## 2022-05-21 NOTE — Progress Notes (Signed)
Per Dr Chryl Heck, ok to skip retacrit for today

## 2022-05-25 ENCOUNTER — Encounter (HOSPITAL_COMMUNITY)
Admission: RE | Admit: 2022-05-25 | Discharge: 2022-05-25 | Disposition: A | Discharge status: Home / Self Care | Payer: Medicare Other | Source: Ambulatory Visit | Attending: Hematology and Oncology | Admitting: Hematology and Oncology

## 2022-05-25 DIAGNOSIS — Z8589 Personal history of malignant neoplasm of other organs and systems: Secondary | ICD-10-CM | POA: Diagnosis present

## 2022-05-25 DIAGNOSIS — C50919 Malignant neoplasm of unspecified site of unspecified female breast: Secondary | ICD-10-CM | POA: Insufficient documentation

## 2022-05-25 DIAGNOSIS — C50112 Malignant neoplasm of central portion of left female breast: Secondary | ICD-10-CM | POA: Diagnosis present

## 2022-05-25 DIAGNOSIS — Z171 Estrogen receptor negative status [ER-]: Secondary | ICD-10-CM | POA: Insufficient documentation

## 2022-05-25 DIAGNOSIS — C50812 Malignant neoplasm of overlapping sites of left female breast: Secondary | ICD-10-CM | POA: Insufficient documentation

## 2022-05-25 LAB — GLUCOSE, CAPILLARY: Glucose-Capillary: 127 mg/dL — ABNORMAL HIGH (ref 70–99)

## 2022-05-25 MED ORDER — FLUDEOXYGLUCOSE F - 18 (FDG) INJECTION
12.0000 | Freq: Once | INTRAVENOUS | Status: AC | PRN
  Administered 2022-05-25: 11.8 via INTRAVENOUS

## 2022-05-27 ENCOUNTER — Telehealth: Payer: Self-pay | Admitting: *Deleted

## 2022-05-27 NOTE — Telephone Encounter (Addendum)
-----   Message from Benay Pike, MD sent at 05/27/2022  8:01 AM EDT ----- Good news, no concern for recurrence on PET scan.  Called and informed pt - Val

## 2022-06-04 ENCOUNTER — Other Ambulatory Visit: Payer: Self-pay | Admitting: Hematology and Oncology

## 2022-06-04 DIAGNOSIS — C50812 Malignant neoplasm of overlapping sites of left female breast: Secondary | ICD-10-CM

## 2022-06-04 DIAGNOSIS — C50112 Malignant neoplasm of central portion of left female breast: Secondary | ICD-10-CM

## 2022-06-04 MED ORDER — LIDOCAINE-PRILOCAINE 2.5-2.5 % EX CREA: TOPICAL_CREAM | CUTANEOUS | 3 refills | Status: DC

## 2022-06-04 NOTE — Progress Notes (Signed)
Plan changed per IS scheduling.

## 2022-06-10 ENCOUNTER — Inpatient Hospital Stay: Payer: Medicare Other

## 2022-06-10 ENCOUNTER — Other Ambulatory Visit: Payer: Self-pay

## 2022-06-10 ENCOUNTER — Encounter: Payer: Self-pay | Admitting: Hematology and Oncology

## 2022-06-10 ENCOUNTER — Inpatient Hospital Stay (HOSPITAL_BASED_OUTPATIENT_CLINIC_OR_DEPARTMENT_OTHER): Payer: Medicare Other | Admitting: Hematology and Oncology

## 2022-06-10 DIAGNOSIS — Z171 Estrogen receptor negative status [ER-]: Secondary | ICD-10-CM

## 2022-06-10 DIAGNOSIS — Z95828 Presence of other vascular implants and grafts: Secondary | ICD-10-CM

## 2022-06-10 DIAGNOSIS — C50112 Malignant neoplasm of central portion of left female breast: Secondary | ICD-10-CM

## 2022-06-10 DIAGNOSIS — Z5112 Encounter for antineoplastic immunotherapy: Secondary | ICD-10-CM | POA: Diagnosis not present

## 2022-06-10 DIAGNOSIS — C50812 Malignant neoplasm of overlapping sites of left female breast: Secondary | ICD-10-CM | POA: Diagnosis not present

## 2022-06-10 LAB — CMP (CANCER CENTER ONLY)
ALT: 16 U/L (ref 0–44)
AST: 19 U/L (ref 15–41)
Albumin: 4 g/dL (ref 3.5–5.0)
Alkaline Phosphatase: 50 U/L (ref 38–126)
Anion gap: 7 (ref 5–15)
BUN: 44 mg/dL — ABNORMAL HIGH (ref 8–23)
CO2: 27 mmol/L (ref 22–32)
Calcium: 9.7 mg/dL (ref 8.9–10.3)
Chloride: 105 mmol/L (ref 98–111)
Creatinine: 1.05 mg/dL — ABNORMAL HIGH (ref 0.44–1.00)
GFR, Estimated: 60 mL/min — ABNORMAL LOW (ref >60)
Glucose, Bld: 154 mg/dL — ABNORMAL HIGH (ref 70–99)
Potassium: 3.5 mmol/L (ref 3.5–5.1)
Sodium: 139 mmol/L (ref 135–145)
Total Bilirubin: 0.4 mg/dL (ref 0.3–1.2)
Total Protein: 8.1 g/dL (ref 6.5–8.1)

## 2022-06-10 LAB — CBC WITH DIFFERENTIAL (CANCER CENTER ONLY)
Abs Immature Granulocytes: 0.01 10*3/uL (ref 0.00–0.07)
Basophils Absolute: 0 10*3/uL (ref 0.0–0.1)
Basophils Relative: 1 %
Eosinophils Absolute: 0.1 10*3/uL (ref 0.0–0.5)
Eosinophils Relative: 2 %
HCT: 32.5 % — ABNORMAL LOW (ref 36.0–46.0)
Hemoglobin: 10.6 g/dL — ABNORMAL LOW (ref 12.0–15.0)
Immature Granulocytes: 0 %
Lymphocytes Relative: 23 %
Lymphs Abs: 1.4 10*3/uL (ref 0.7–4.0)
MCH: 28.6 pg (ref 26.0–34.0)
MCHC: 32.6 g/dL (ref 30.0–36.0)
MCV: 87.6 fL (ref 80.0–100.0)
Monocytes Absolute: 0.7 10*3/uL (ref 0.1–1.0)
Monocytes Relative: 12 %
Neutro Abs: 3.8 10*3/uL (ref 1.7–7.7)
Neutrophils Relative %: 62 %
Platelet Count: 204 10*3/uL (ref 150–400)
RBC: 3.71 MIL/uL — ABNORMAL LOW (ref 3.87–5.11)
RDW: 14.7 % (ref 11.5–15.5)
WBC Count: 6 10*3/uL (ref 4.0–10.5)
nRBC: 0 % (ref 0.0–0.2)

## 2022-06-10 MED ORDER — ACETAMINOPHEN 325 MG PO TABS
650.0000 mg | ORAL_TABLET | Freq: Once | ORAL | Status: AC
  Administered 2022-06-10: 650 mg via ORAL
  Filled 2022-06-10: qty 2

## 2022-06-10 MED ORDER — HEPARIN SOD (PORK) LOCK FLUSH 100 UNIT/ML IV SOLN: 500.0000 [IU] | Freq: Once | INTRAVENOUS | Status: AC

## 2022-06-10 MED ORDER — SODIUM CHLORIDE 0.9% FLUSH
10.0000 mL | Freq: Once | INTRAVENOUS | Status: AC
  Administered 2022-06-10: 10 mL

## 2022-06-10 MED ORDER — DIPHENHYDRAMINE HCL 25 MG PO CAPS
25.0000 mg | ORAL_CAPSULE | Freq: Once | ORAL | Status: AC
  Administered 2022-06-10: 25 mg via ORAL

## 2022-06-10 MED ORDER — EPOETIN ALFA-EPBX 40000 UNIT/ML IJ SOLN: 40000.0000 [IU] | Freq: Once | INTRAMUSCULAR | Status: DC

## 2022-06-10 MED ORDER — TRASTUZUMAB-ANNS CHEMO 150 MG IV SOLR
6.0000 mg/kg | Freq: Once | INTRAVENOUS | Status: AC
  Administered 2022-06-10: 630 mg via INTRAVENOUS
  Filled 2022-06-10: qty 30

## 2022-06-10 MED ORDER — SODIUM CHLORIDE 0.9 % IV SOLN: Freq: Once | INTRAVENOUS | Status: AC

## 2022-06-10 MED ORDER — HEPARIN SOD (PORK) LOCK FLUSH 100 UNIT/ML IV SOLN
500.0000 [IU] | Freq: Once | INTRAVENOUS | Status: AC | PRN
  Administered 2022-06-10: 500 [IU]

## 2022-06-10 MED ORDER — SODIUM CHLORIDE 0.9% FLUSH
10.0000 mL | INTRAVENOUS | Status: DC | PRN
  Administered 2022-06-10: 10 mL

## 2022-06-10 MED ORDER — DIPHENHYDRAMINE HCL 25 MG PO CAPS
50.0000 mg | ORAL_CAPSULE | Freq: Once | ORAL | Status: DC
  Filled 2022-06-10: qty 2

## 2022-06-10 NOTE — Patient Instructions (Signed)
[facility]  Discharge Instructions: Thank you for choosing [facility] to provide your oncology and hematology care.   If you have a lab appointment with the Cancer Center, please go directly to the Cancer Center and check in at the registration area.   Wear comfortable clothing and clothing appropriate for easy access to any Portacath or PICC line.   We strive to give you quality time with your provider. You may need to reschedule your appointment if you arrive late (15 or more minutes).  Arriving late affects you and other patients whose appointments are after yours.  Also, if you miss three or more appointments without notifying the office, you may be dismissed from the clinic at the provider's discretion.      For prescription refill requests, have your pharmacy contact our office and allow 72 hours for refills to be completed.    Today you received the following chemotherapy and/or immunotherapy agents: Kanjinti      To help prevent nausea and vomiting after your treatment, we encourage you to take your nausea medication as directed.  BELOW ARE SYMPTOMS THAT SHOULD BE REPORTED IMMEDIATELY: *FEVER GREATER THAN 100.4 F (38 C) OR HIGHER *CHILLS OR SWEATING *NAUSEA AND VOMITING THAT IS NOT CONTROLLED WITH YOUR NAUSEA MEDICATION *UNUSUAL SHORTNESS OF BREATH *UNUSUAL BRUISING OR BLEEDING *URINARY PROBLEMS (pain or burning when urinating, or frequent urination) *BOWEL PROBLEMS (unusual diarrhea, constipation, pain near the anus) TENDERNESS IN MOUTH AND THROAT WITH OR WITHOUT PRESENCE OF ULCERS (sore throat, sores in mouth, or a toothache) UNUSUAL RASH, SWELLING OR PAIN  UNUSUAL VAGINAL DISCHARGE OR ITCHING   Items with * indicate a potential emergency and should be followed up as soon as possible or go to the Emergency Department if any problems should occur.  Please show the CHEMOTHERAPY ALERT CARD or IMMUNOTHERAPY ALERT CARD at check-in to  the Emergency Department and triage nurse.  Should you have questions after your visit or need to cancel or reschedule your appointment, please contact [facility]  Dept: [phone]  and follow the prompts.  Office hours are 8:00 a.m. to 4:30 p.m. Monday - Friday. Please note that voicemails left after 4:00 p.m. may not be returned until the following business day.  We are closed weekends and major holidays. You have access to a nurse at all times for urgent questions. Please call the main number to the clinic Dept: [phone] and follow the prompts.   For any non-urgent questions, you may also contact your provider using MyChart. We now offer e-Visits for anyone 18 and older to request care online for non-urgent symptoms. For details visit [email or web address].   Also download the MyChart app! Go to the app store, search "MyChart", open the app, select [facility], and log in with your MyChart username and password.  Masks are optional in the cancer centers. If you would like for your care team to wear a mask while they are taking care of you, please let them know. You may have one support person who is at least 64 years old accompany you for your appointments. 

## 2022-06-10 NOTE — Progress Notes (Signed)
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 Atherosclerosis (ICD10-I70.0). Electronically Signed   By: Kerby Moors M.D.   On: 05/26/2022 08:44     ASSESSMENT/PLAN   66 y.o.  [place] woman with metastatic HER2 amplified breast cancer currently on single agent Herceptin, please refer to the above-mentioned history who is here for follow-up. She is doing well on single agent Herceptin.  No signs of cardiac compromise.  Last ECHO in 02/2022 showing EF of 55-60%. Seen Dr Aundra Dubin , ok to continue herceptin. 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 Recent imaging in August without any evidence of metastatic disease.  She is also on long-term Xarelto. Physical examination today unremarkable except for chronic neuropathy related symptoms, okay to continue Herceptin as recommended RTC in 6 weeks.  Follow-up with cardio oncology for monitoring Herceptin toxicity She was once again encouraged to talk to her neurologist about  neuropathy related symptoms.  We do not believe her current symptoms are related to Herceptin. No role for Retacrit today, hemoglobin over 10 g/dL Total time spent: 30 minutes  Benay Pike, MD Medical Oncology and Hematology [facility] [address] [place], [place] [address] Tel. [phone]    Fax. [phone]   *Total Encounter Time as defined by the Centers for Medicare and Medicaid Services includes, in addition to the face-to-face time of a patient visit (documented in the note above) non-face-to-face time: obtaining and reviewing outside history, ordering and reviewing medications, tests or procedures, care coordination (communications with other health care professionals or caregivers) and documentation in the medical record.

## 2022-06-10 NOTE — Progress Notes (Signed)
Okay to treat with echo from 5/17 per Dr. Chryl Heck

## 2022-06-26 ENCOUNTER — Ambulatory Visit (INDEPENDENT_AMBULATORY_CARE_PROVIDER_SITE_OTHER): Payer: Medicare Other | Admitting: Neurology

## 2022-06-26 ENCOUNTER — Encounter: Payer: Self-pay | Admitting: Neurology

## 2022-06-26 VITALS — BP 124/82 | HR 77 | Ht 59.0 in | Wt 240.0 lb

## 2022-06-26 DIAGNOSIS — G959 Disease of spinal cord, unspecified: Secondary | ICD-10-CM

## 2022-06-26 DIAGNOSIS — G629 Polyneuropathy, unspecified: Secondary | ICD-10-CM | POA: Diagnosis not present

## 2022-06-26 MED ORDER — GABAPENTIN 300 MG PO CAPS: 600.0000 mg | ORAL_CAPSULE | Freq: Three times a day (TID) | ORAL | 3 refills | Status: DC

## 2022-06-26 NOTE — Progress Notes (Signed)
Follow-up Visit   Date: 06/26/22   [name] MRN: [id number] DOB: 1956/07/22   Interim History: [name] is a 65 y.o. 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  The patient was accompanied to the clinic by self.   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  Her last MRI cervical spine was from 05/2021 and shows mild-moderate canal stenosis at C3-C6.  However, clinically, there has been a significant improvement in finger strength, specifically finger extension and flexion is now 5/5, previously 3/5.  She still has 3/5 motor strength with finger abductors and adductors and atrophy involving the intrinsic hand muscles.  Right foot drop is unchanged. Given her improvement, there is no indication for repeat MRI cervical spine.  She is seeing orthopeadic hand specialist at [facility] and scheduled to have repeat NCS/EMG of the hands.  It will be interesting to see if there is ongoing neuropathy or findings to suggest more entrapment neuropathy. Evaluation for neuropathy has been extensive with serology as well as CSF testing, which has been nondiagnostic for etiology.  For paresthesias, I have refilled her gabapentin 626m TID. Continue supportive care  Return to clinic as needed  ----------------------------------------------------------------------------------- UPDATE 06/26/2022:  She feels there hands are easier to open up and she is able to grasp at items better.  She continues to have muscle atrophy and numbness in the hands. [facility] to assess for entrapment neuropathy.  [facility] did not feel any additional surgery was indicated for cervical canal stenosis.    Medications:  Current Outpatient Medications on File Prior to Visit  Medication Sig Dispense Refill   Ascorbic Acid (VITAMIN C) 100 MG tablet Take 100 mg by mouth daily.     carvedilol (COREG) 6.25 MG tablet TAKE 1 TABLET BY MOUTH TWICE A DAY 60 tablet 11   cholecalciferol (VITAMIN D3) 25 MCG (1000 UNIT) tablet Take 1,000 Units by mouth daily.     cyanocobalamin (VITAMIN B12) 500 MCG tablet Take 500 mcg by mouth daily.     ferrous sulfate 325 (65 FE) MG EC tablet Take 325 mg by mouth 3 (three) times daily with meals.     furosemide (LASIX) 40 MG tablet Take 1 tablet (40 mg total) by mouth daily. 90 tablet 3   gabapentin (NEURONTIN) 300 MG capsule Take 2 capsules (600 mg total) by mouth 3 (three) times daily. 480 capsule 1   glucose blood (ONE TOUCH ULTRA TEST) test strip 1 each by Other route 2 (two) times daily. Use to check blood sugars twice a day Dx E11.9 100 each 0   Lancets (ONETOUCH ULTRASOFT) lancets 1 each by Other route 2 (two) times daily. Use to check blood sugars twice a day Dx E11.9 100 each 0   lidocaine-prilocaine (EMLA) cream 4 gram tid prn 120 g 5   lidocaine-prilocaine (EMLA) cream Apply to affected area once 30 g 3   magnesium 30 MG tablet Take 30 mg by mouth 2 (two) times daily.     Multiple Vitamins-Minerals (MULTIVITAMIN WITH MINERALS) tablet Take 1 tablet by mouth daily.     omeprazole (PRILOSEC) 40 MG capsule Take 40 mg by mouth daily  as needed (Heartburn).     potassium chloride SA (KLOR-CON M20) 20 MEQ tablet Take 1 tablet (20 mEq total) by mouth daily. 90 tablet 3   pravastatin (PRAVACHOL) 40 MG tablet TAKE 1 TABLET BY MOUTH EVERY DAY 30 tablet 11   rivaroxaban (XARELTO) 20 MG TABS tablet Take 1 tablet (20 mg total) by mouth daily with supper. 90 tablet 3   silver sulfADIAZINE (SILVADENE) 1 % cream Apply 1 application topically as needed.      telmisartan-hydrochlorothiazide (MICARDIS HCT) 80-12.5 MG tablet Take 1 tablet by mouth daily. 90 tablet 3   vitamin E 180 MG (400 UNITS) capsule Take 400 Units by mouth daily.     Current Facility-Administered Medications on File Prior to Visit  Medication Dose Route Frequency Provider Last Rate Last Admin   cloNIDine (CATAPRES) tablet 0.1 mg  0.1 mg Oral Daily Tanner, Lucianne Lei E., PA-C   0.1 mg at 11/21/19 1742   heparin lock flush 100 unit/mL  500 Units Intracatheter Once Benay Pike, MD        Allergies:  Allergies  Allergen Reactions   Morphine And Related Nausea And Vomiting   Codeine Nausea Only   Cymbalta [Duloxetine Hcl] Other (See Comments)    Head felt funny, ? thinking not right   Darvon Nausea Only   Hydrocodone Nausea Only   Hydrocodone-Acetaminophen Nausea Only   Oxycodone Nausea Only   Propoxyphene Hcl Nausea Only   Rosuvastatin Other (See Comments)    Bone pain    Vital Signs:  BP 124/82   Pulse 77   Ht 4' 11"  (1.499 m)   Wt 240 lb (108.9 kg)   LMP 05/18/2016   SpO2 98%   BMI 48.47 kg/m   Neurological Exam: MENTAL STATUS including orientation to time, place, person, recent and remote memory, attention span and concentration, language, and fund of knowledge is normal.  Speech is not dysarthric.  CRANIAL NERVES:  No visual field defects.  Pupils equal round and reactive to light.  Normal conjugate, extra-ocular eye movements in all directions of gaze.  No ptosis.    MOTOR:  Moderate bilateral hand atrophy involving the intrinsic hand muscles.  No fasciculations or abnormal movements.  No pronator drift.    Upper Extremity:  Right   Left  Deltoid  5/5    5/5   Biceps  5/5    5/5   Triceps  5/5    5/5   Infraspinatus 5/5   5/5  Medial pectoralis 5/5   5/5  Wrist extensors  5/5    5/5   Wrist flexors  5/5    5/5   Finger extensors * 5/5    5/5   Finger flexors * 5/5    5/5   Dorsal interossei  3/5    3/5   Abductor pollicis  4/5    4/5   Tone (Ashworth  scale)  0   0    Lower Extremity:  Right   Left  Hip flexors  5/5    5/5   Hip extensors  5/5    5/5   Adductor 5/5   5/5  Abductor 5/5   5/5  Knee flexors  5/5    5/5   Knee extensors  5/5    5/5   Dorsiflexors  4/5    5/5   Plantarflexors  4/5    5/5   Toe extensors  4/5    5/5   Toe flexors  4/5    5/5  Tone (Ashworth scale)  0   0    MSRs:    Right        Left                  brachioradialis 3+   3+  biceps 3+   3+  triceps 3+   3+  patellar 3+   3+  ankle jerk 2+   2+  Hoffman no   no  plantar response down   down    SENSORY:  Temperature and pin prick reduced in the hands and feet.  Vibration reduced at the right ankle.   COORDINATION/GAIT: Normal finger-to- nose-finger.  Finger tapping is slowed due to hand weakness.  Gait is assisted with a cane, slow, stable.     Data: MRI cervical spine wo contrast 05/19/2021: 1. At C5-C6, mild to moderate canal stenosis with severe left and moderate right foraminal stenosis. 2. At C3-C4 and C4-C5, posterior disc versus osteophyte or OPLL contacts and flattens the ventral cord with mild canal stenosis. Mild foraminal stenosis on the right at C3-C4. 3. C4-C5 ACDF.   NCS/EMG of the arms 08/05/2020: This is an abnormal study.  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  There are also electrodiagnostic evidence of moderate left carpal tunnel syndromes.  There was no evidence of active right cervical radiculopathy.   NCS/EMG from [facility] (under media tab) which shows diffuse sensorimotor neuropathy affecting the arms, which is worse then what was seen on her NCS/EMG from October. NCS/EMG dated 10/31/2020 showed absent median, ulnar, and radial sensory responses with prolonged median and ulnar motor responses.    MRI cervical spine wo contrast 09/09/2020: 1. Prominent right central disc osteophyte complex at C4-5 with  resultant severe spinal stenosis and flattening of the cervical spinal cord. No appreciable cord signal changes. 2. Broad posterior disc osteophyte at C5-6 with resultant mild spinal stenosis, with moderate left worse than right C6 foraminal narrowing. 3. Right subarticular to foraminal disc osteophyte complex at C3-4 with resultant mild spinal stenosis, with moderate right C4 foraminal narrowing. 4. 1.5 cm cystic well-circumscribed lesion adjacent to the upper esophagus as above, indeterminate. Unclear whether this is associated with the adjacent esophagus, possibly reflecting an esophageal diverticulum, or possibly an exophytic thyroid nodule. Further evaluation with dedicated thyroid ultrasound recommended. (ref: J Am Coll Radiol. 2015 Feb;12(2): 143-50). 5. No evidence for metastatic disease within the cervical spine.  Labs 01/23/2021:  ESR >100, TSH 1.57, B12 508, folate 8.9, SPEP with IFE no M protein, copper 173 Labs 02/11/2021: ESR 81*, antiPR3 antibody 31*, MPO neg, SSA/B neg, RF neg, cryo neg, CRP <1.0  CSF analysis 01/27/2021:  R0 W7* P33 G53, cytology neg, OCB - identical bands in CSF and serum suggestive of systemic inflammation  Total time spent reviewing records, interview, history/exam, documentation, and coordination of care on day of encounter:  30 min    Thank you for allowing me to participate in patient's care.  If I can answer any additional questions, I would be pleased to do so.    Sincerely,    Crystle Carelli K. Posey Pronto, DO

## 2022-07-02 ENCOUNTER — Inpatient Hospital Stay (HOSPITAL_BASED_OUTPATIENT_CLINIC_OR_DEPARTMENT_OTHER): Payer: Medicare Other

## 2022-07-02 ENCOUNTER — Other Ambulatory Visit: Payer: Self-pay

## 2022-07-02 ENCOUNTER — Inpatient Hospital Stay: Payer: Medicare Other | Attending: Hematology and Oncology

## 2022-07-02 VITALS — BP 113/65 | HR 68 | Temp 98.3°F | Resp 16 | Wt 241.5 lb

## 2022-07-02 DIAGNOSIS — C50112 Malignant neoplasm of central portion of left female breast: Secondary | ICD-10-CM

## 2022-07-02 DIAGNOSIS — T451X5A Adverse effect of antineoplastic and immunosuppressive drugs, initial encounter: Secondary | ICD-10-CM | POA: Diagnosis not present

## 2022-07-02 DIAGNOSIS — Z171 Estrogen receptor negative status [ER-]: Secondary | ICD-10-CM | POA: Diagnosis not present

## 2022-07-02 DIAGNOSIS — C50812 Malignant neoplasm of overlapping sites of left female breast: Secondary | ICD-10-CM | POA: Insufficient documentation

## 2022-07-02 DIAGNOSIS — Z5112 Encounter for antineoplastic immunotherapy: Secondary | ICD-10-CM | POA: Diagnosis present

## 2022-07-02 DIAGNOSIS — C773 Secondary and unspecified malignant neoplasm of axilla and upper limb lymph nodes: Secondary | ICD-10-CM | POA: Diagnosis present

## 2022-07-02 DIAGNOSIS — D6481 Anemia due to antineoplastic chemotherapy: Secondary | ICD-10-CM | POA: Diagnosis not present

## 2022-07-02 DIAGNOSIS — Z95828 Presence of other vascular implants and grafts: Secondary | ICD-10-CM

## 2022-07-02 LAB — CBC WITH DIFFERENTIAL (CANCER CENTER ONLY)
Abs Immature Granulocytes: 0.02 10*3/uL (ref 0.00–0.07)
Basophils Absolute: 0 10*3/uL (ref 0.0–0.1)
Basophils Relative: 0 %
Eosinophils Absolute: 0.1 10*3/uL (ref 0.0–0.5)
Eosinophils Relative: 2 %
HCT: 30.5 % — ABNORMAL LOW (ref 36.0–46.0)
Hemoglobin: 9.7 g/dL — ABNORMAL LOW (ref 12.0–15.0)
Immature Granulocytes: 0 %
Lymphocytes Relative: 27 %
Lymphs Abs: 1.4 10*3/uL (ref 0.7–4.0)
MCH: 28.4 pg (ref 26.0–34.0)
MCHC: 31.8 g/dL (ref 30.0–36.0)
MCV: 89.4 fL (ref 80.0–100.0)
Monocytes Absolute: 0.6 10*3/uL (ref 0.1–1.0)
Monocytes Relative: 12 %
Neutro Abs: 3.1 10*3/uL (ref 1.7–7.7)
Neutrophils Relative %: 59 %
Platelet Count: 181 10*3/uL (ref 150–400)
RBC: 3.41 MIL/uL — ABNORMAL LOW (ref 3.87–5.11)
RDW: 14.7 % (ref 11.5–15.5)
WBC Count: 5.2 10*3/uL (ref 4.0–10.5)
nRBC: 0 % (ref 0.0–0.2)

## 2022-07-02 LAB — CMP (CANCER CENTER ONLY)
ALT: 15 U/L (ref 0–44)
AST: 18 U/L (ref 15–41)
Albumin: 3.8 g/dL (ref 3.5–5.0)
Alkaline Phosphatase: 43 U/L (ref 38–126)
Anion gap: 5 (ref 5–15)
BUN: 30 mg/dL — ABNORMAL HIGH (ref 8–23)
CO2: 29 mmol/L (ref 22–32)
Calcium: 9.3 mg/dL (ref 8.9–10.3)
Chloride: 105 mmol/L (ref 98–111)
Creatinine: 1.02 mg/dL — ABNORMAL HIGH (ref 0.44–1.00)
GFR, Estimated: >60 mL/min (ref >60)
Glucose, Bld: 136 mg/dL — ABNORMAL HIGH (ref 70–99)
Potassium: 3.6 mmol/L (ref 3.5–5.1)
Sodium: 139 mmol/L (ref 135–145)
Total Bilirubin: 0.3 mg/dL (ref 0.3–1.2)
Total Protein: 7.7 g/dL (ref 6.5–8.1)

## 2022-07-02 MED ORDER — TRASTUZUMAB-ANNS CHEMO 150 MG IV SOLR
6.0000 mg/kg | Freq: Once | INTRAVENOUS | Status: AC
  Administered 2022-07-02: 630 mg via INTRAVENOUS
  Filled 2022-07-02: qty 30

## 2022-07-02 MED ORDER — EPOETIN ALFA-EPBX 40000 UNIT/ML IJ SOLN: 40000.0000 [IU] | Freq: Once | INTRAMUSCULAR | Status: DC

## 2022-07-02 MED ORDER — ACETAMINOPHEN 325 MG PO TABS
650.0000 mg | ORAL_TABLET | Freq: Once | ORAL | Status: AC
  Administered 2022-07-02: 650 mg via ORAL
  Filled 2022-07-02: qty 2

## 2022-07-02 MED ORDER — DIPHENHYDRAMINE HCL 25 MG PO CAPS
25.0000 mg | ORAL_CAPSULE | Freq: Once | ORAL | Status: AC
  Administered 2022-07-02: 25 mg via ORAL
  Filled 2022-07-02: qty 1

## 2022-07-02 MED ORDER — HEPARIN SOD (PORK) LOCK FLUSH 100 UNIT/ML IV SOLN
500.0000 [IU] | Freq: Once | INTRAVENOUS | Status: AC | PRN
  Administered 2022-07-02: 500 [IU]

## 2022-07-02 MED ORDER — EPOETIN ALFA-EPBX 20000 UNIT/ML IJ SOLN
40000.0000 [IU] | Freq: Once | INTRAMUSCULAR | Status: AC
  Administered 2022-07-02: 40000 [IU] via SUBCUTANEOUS
  Filled 2022-07-02: qty 2

## 2022-07-02 MED ORDER — SODIUM CHLORIDE 0.9 % IV SOLN: Freq: Once | INTRAVENOUS | Status: AC

## 2022-07-02 MED ORDER — SODIUM CHLORIDE 0.9% FLUSH
10.0000 mL | Freq: Once | INTRAVENOUS | Status: AC
  Administered 2022-07-02: 10 mL

## 2022-07-02 MED ORDER — SODIUM CHLORIDE 0.9% FLUSH
10.0000 mL | INTRAVENOUS | Status: DC | PRN
  Administered 2022-07-02: 10 mL

## 2022-07-02 NOTE — Progress Notes (Signed)
Per Dr Chryl Heck, [facility] only needed every 6 months. Ok to treat today with Echo from May.

## 2022-07-02 NOTE — Patient Instructions (Signed)
[facility] MEDICAL ONCOLOGY  Discharge Instructions: Thank you for choosing [facility] to provide your oncology and hematology care.   If you have a lab appointment with the Cancer Center, please go directly to the Cancer Center and check in at the registration area.   Wear comfortable clothing and clothing appropriate for easy access to any Portacath or PICC line.   We strive to give you quality time with your provider. You may need to reschedule your appointment if you arrive late (15 or more minutes).  Arriving late affects you and other patients whose appointments are after yours.  Also, if you miss three or more appointments without notifying the office, you may be dismissed from the clinic at the provider's discretion.      For prescription refill requests, have your pharmacy contact our office and allow 72 hours for refills to be completed.    Today you received the following chemotherapy and/or immunotherapy agents: trastuzumab-anns      To help prevent nausea and vomiting after your treatment, we encourage you to take your nausea medication as directed.  BELOW ARE SYMPTOMS THAT SHOULD BE REPORTED IMMEDIATELY: *FEVER GREATER THAN 100.4 F (38 C) OR HIGHER *CHILLS OR SWEATING *NAUSEA AND VOMITING THAT IS NOT CONTROLLED WITH YOUR NAUSEA MEDICATION *UNUSUAL SHORTNESS OF BREATH *UNUSUAL BRUISING OR BLEEDING *URINARY PROBLEMS (pain or burning when urinating, or frequent urination) *BOWEL PROBLEMS (unusual diarrhea, constipation, pain near the anus) TENDERNESS IN MOUTH AND THROAT WITH OR WITHOUT PRESENCE OF ULCERS (sore throat, sores in mouth, or a toothache) UNUSUAL RASH, SWELLING OR PAIN  UNUSUAL VAGINAL DISCHARGE OR ITCHING   Items with * indicate a potential emergency and should be followed up as soon as possible or go to the Emergency Department if any problems should occur.  Please show the CHEMOTHERAPY ALERT CARD or IMMUNOTHERAPY ALERT CARD at  check-in to the Emergency Department and triage nurse.  Should you have questions after your visit or need to cancel or reschedule your appointment, please contact [facility] MEDICAL ONCOLOGY  Dept: [phone]  and follow the prompts.  Office hours are 8:00 a.m. to 4:30 p.m. Monday - Friday. Please note that voicemails left after 4:00 p.m. may not be returned until the following business day.  We are closed weekends and major holidays. You have access to a nurse at all times for urgent questions. Please call the main number to the clinic Dept: [phone] and follow the prompts.   For any non-urgent questions, you may also contact your provider using MyChart. We now offer e-Visits for anyone 18 and older to request care online for non-urgent symptoms. For details visit [email or web address].   Also download the MyChart app! Go to the app store, search "MyChart", open the app, select [facility], and log in with your MyChart username and password.  Masks are optional in the cancer centers. If you would like for your care team to wear a mask while they are taking care of you, please let them know. You may have one support person who is at least 64 years old accompany you for your appointments. 

## 2022-07-09 DIAGNOSIS — G5623 Lesion of ulnar nerve, bilateral upper limbs: Secondary | ICD-10-CM | POA: Insufficient documentation

## 2022-07-09 DIAGNOSIS — G5603 Carpal tunnel syndrome, bilateral upper limbs: Secondary | ICD-10-CM | POA: Insufficient documentation

## 2022-07-09 DIAGNOSIS — Z9221 Personal history of antineoplastic chemotherapy: Secondary | ICD-10-CM | POA: Insufficient documentation

## 2022-07-10 ENCOUNTER — Ambulatory Visit (INDEPENDENT_AMBULATORY_CARE_PROVIDER_SITE_OTHER): Payer: Medicare Other | Admitting: Internal Medicine

## 2022-07-10 VITALS — BP 120/74 | HR 65 | Temp 98.1°F | Ht 59.0 in | Wt 238.0 lb

## 2022-07-10 DIAGNOSIS — Z0001 Encounter for general adult medical examination with abnormal findings: Secondary | ICD-10-CM | POA: Diagnosis not present

## 2022-07-10 DIAGNOSIS — E7800 Pure hypercholesterolemia, unspecified: Secondary | ICD-10-CM

## 2022-07-10 DIAGNOSIS — R32 Unspecified urinary incontinence: Secondary | ICD-10-CM

## 2022-07-10 DIAGNOSIS — Z1211 Encounter for screening for malignant neoplasm of colon: Secondary | ICD-10-CM

## 2022-07-10 DIAGNOSIS — E119 Type 2 diabetes mellitus without complications: Secondary | ICD-10-CM | POA: Diagnosis not present

## 2022-07-10 DIAGNOSIS — Z23 Encounter for immunization: Secondary | ICD-10-CM

## 2022-07-10 DIAGNOSIS — I10 Essential (primary) hypertension: Secondary | ICD-10-CM

## 2022-07-10 DIAGNOSIS — E538 Deficiency of other specified B group vitamins: Secondary | ICD-10-CM | POA: Diagnosis not present

## 2022-07-10 DIAGNOSIS — M5020 Other cervical disc displacement, unspecified cervical region: Secondary | ICD-10-CM | POA: Insufficient documentation

## 2022-07-10 DIAGNOSIS — E559 Vitamin D deficiency, unspecified: Secondary | ICD-10-CM | POA: Diagnosis not present

## 2022-07-10 DIAGNOSIS — G629 Polyneuropathy, unspecified: Secondary | ICD-10-CM | POA: Insufficient documentation

## 2022-07-10 LAB — URINALYSIS, ROUTINE W REFLEX MICROSCOPIC
Bilirubin Urine: NEGATIVE
Ketones, ur: NEGATIVE
Leukocytes,Ua: NEGATIVE
Nitrite: NEGATIVE
RBC / HPF: NONE SEEN (ref 0–?)
Specific Gravity, Urine: 1.02 (ref 1.000–1.030)
Total Protein, Urine: NEGATIVE
Urine Glucose: NEGATIVE
Urobilinogen, UA: 0.2 (ref 0.0–1.0)
WBC, UA: NONE SEEN (ref 0–?)
pH: 5.5 (ref 5.0–8.0)

## 2022-07-10 LAB — VITAMIN D 25 HYDROXY (VIT D DEFICIENCY, FRACTURES): VITD: 37.66 ng/mL (ref 30.00–100.00)

## 2022-07-10 LAB — LIPID PANEL
Cholesterol: 158 mg/dL (ref 0–200)
HDL: 59.1 mg/dL (ref >39.00)
LDL Cholesterol: 77 mg/dL (ref 0–99)
NonHDL: 98.87
Total CHOL/HDL Ratio: 3
Triglycerides: 109 mg/dL (ref 0.0–149.0)
VLDL: 21.8 mg/dL (ref 0.0–40.0)

## 2022-07-10 LAB — MICROALBUMIN / CREATININE URINE RATIO
Creatinine,U: 98.6 mg/dL
Microalb Creat Ratio: 0.7 mg/g (ref 0.0–30.0)
Microalb, Ur: <0.7 mg/dL (ref 0.0–1.9)

## 2022-07-10 LAB — TSH: TSH: 1.16 u[IU]/mL (ref 0.35–5.50)

## 2022-07-10 LAB — VITAMIN B12: Vitamin B-12: >1500 pg/mL — ABNORMAL HIGH (ref 211–911)

## 2022-07-10 NOTE — Patient Instructions (Addendum)
You had the flu shot and Tdap tetanus shot today  Please consider the Shingrix and Prevnar 20 at the pharmacy after about 1 week or more  Please continue all other medications as before, and refills have been done if requested.  Please have the pharmacy call with any other refills you may need.  Please continue your efforts at being more active, low cholesterol diet, and weight control.  You are otherwise up to date with prevention measures today.  Please keep your appointments with your specialists as you may have planned - hand surgury, oncology, neurology  You will be contacted regarding the referral for: urology  Please make an appt with Dr Denton Lank for Endocrinology soon as well  Please go to the LAB at the blood drawing area for the tests to be done  You will be contacted by phone if any changes need to be made immediately.  Otherwise, you will receive a letter about your results with an explanation, but please check with MyChart first.  Please remember to sign up for MyChart if you have not done so, as this will be important to you in the future with finding out test results, communicating by private email, and scheduling acute appointments online when needed.  Please make an Appointment to return in 6 months, or sooner if needed
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
## 2022-07-11 LAB — URINE CULTURE: Result:: NO GROWTH

## 2022-07-12 ENCOUNTER — Encounter: Payer: Self-pay | Admitting: Internal Medicine

## 2022-07-12 NOTE — Assessment & Plan Note (Signed)
Lab Results  Component Value Date   LDLCALC 77 07/10/2022   Stable, pt to continue current statin pravachol 40 qd

## 2022-07-12 NOTE — Assessment & Plan Note (Addendum)
Lab Results  Component Value Date   HGBA1C 6.6 (H) 08/20/2021   Stable, pt to continue current medical treatment  - diet, wt control, exercise and f/u endo Dr Denton Lank, consider Darcel Bayley

## 2022-07-12 NOTE — Assessment & Plan Note (Signed)
BP Readings from Last 3 Encounters:  07/10/22 120/74  07/02/22 113/65  06/26/22 124/82   Stable, pt to continue medical treatment coreg 6.25 bid, micardis HCT 80-12.5 qd

## 2022-07-12 NOTE — Assessment & Plan Note (Signed)
Last vitamin D Lab Results  Component Value Date   VD25OH 37.66 07/10/2022   Low, to start oral replacement

## 2022-07-12 NOTE — Assessment & Plan Note (Signed)
Worsening recent, for urology referral

## 2022-07-12 NOTE — Assessment & Plan Note (Signed)
Age and sex appropriate education and counseling updated with regular exercise and diet Referrals for preventative services - for colonoscopy Immunizations addressed - for flu shot, tdap, and plans for covid booster, shingrix, and prevnar 20 at pharmacy Smoking counseling  - none needed Evidence for depression or other mood disorder - none significant Most recent labs reviewed. I have personally reviewed and have noted: 1) the patient's medical and social history 2) The patient's current medications and supplements 3) The patient's height, weight, and BMI have been recorded in the chart

## 2022-07-13 ENCOUNTER — Telehealth: Payer: Self-pay | Admitting: Internal Medicine

## 2022-07-13 LAB — HEMOGLOBIN A1C: Hgb A1c MFr Bld: 7.1 % — ABNORMAL HIGH (ref 4.6–6.5)

## 2022-07-13 NOTE — Telephone Encounter (Signed)
PT calls today in regards to last visit with Dr.John. PT had received their tetanus and flu shot and is currently experiencing a reaction from those. Both points of injection are currently raised with the circumference of a coke bottle, red, and itchy. PT was not sure how long this reaction had been going on for as she only noticed it once it started itching. Wanted to know how to proceed.  CB: [phone]

## 2022-07-13 NOTE — Telephone Encounter (Signed)
Spoke with patient regarding her reaction from the vaccines and I informed Dr.John and advised patient to use benadryl cream for the itching and redness and to let us know it gets worse or develops a fever to let us know and he will send in a abx.

## 2022-07-23 ENCOUNTER — Inpatient Hospital Stay (HOSPITAL_BASED_OUTPATIENT_CLINIC_OR_DEPARTMENT_OTHER): Payer: Medicare Other | Admitting: Hematology and Oncology

## 2022-07-23 ENCOUNTER — Inpatient Hospital Stay: Payer: Medicare Other | Attending: Hematology and Oncology

## 2022-07-23 ENCOUNTER — Encounter: Payer: Self-pay | Admitting: Hematology and Oncology

## 2022-07-23 ENCOUNTER — Other Ambulatory Visit: Payer: Self-pay

## 2022-07-23 ENCOUNTER — Inpatient Hospital Stay: Payer: Medicare Other

## 2022-07-23 VITALS — BP 149/87 | HR 70 | Temp 97.5°F | Resp 18 | Ht 59.0 in | Wt 242.7 lb

## 2022-07-23 DIAGNOSIS — C50112 Malignant neoplasm of central portion of left female breast: Secondary | ICD-10-CM

## 2022-07-23 DIAGNOSIS — Z923 Personal history of irradiation: Secondary | ICD-10-CM | POA: Diagnosis not present

## 2022-07-23 DIAGNOSIS — Z7901 Long term (current) use of anticoagulants: Secondary | ICD-10-CM | POA: Diagnosis not present

## 2022-07-23 DIAGNOSIS — C773 Secondary and unspecified malignant neoplasm of axilla and upper limb lymph nodes: Secondary | ICD-10-CM | POA: Insufficient documentation

## 2022-07-23 DIAGNOSIS — Z5112 Encounter for antineoplastic immunotherapy: Secondary | ICD-10-CM | POA: Diagnosis present

## 2022-07-23 DIAGNOSIS — C50812 Malignant neoplasm of overlapping sites of left female breast: Secondary | ICD-10-CM | POA: Diagnosis present

## 2022-07-23 DIAGNOSIS — Z8542 Personal history of malignant neoplasm of other parts of uterus: Secondary | ICD-10-CM | POA: Diagnosis not present

## 2022-07-23 DIAGNOSIS — G629 Polyneuropathy, unspecified: Secondary | ICD-10-CM | POA: Insufficient documentation

## 2022-07-23 DIAGNOSIS — Z79899 Other long term (current) drug therapy: Secondary | ICD-10-CM | POA: Insufficient documentation

## 2022-07-23 DIAGNOSIS — Z171 Estrogen receptor negative status [ER-]: Secondary | ICD-10-CM | POA: Diagnosis not present

## 2022-07-23 LAB — CBC WITH DIFFERENTIAL (CANCER CENTER ONLY)
Abs Immature Granulocytes: 0.02 10*3/uL (ref 0.00–0.07)
Basophils Absolute: 0 10*3/uL (ref 0.0–0.1)
Basophils Relative: 0 %
Eosinophils Absolute: 0.1 10*3/uL (ref 0.0–0.5)
Eosinophils Relative: 1 %
HCT: 31.5 % — ABNORMAL LOW (ref 36.0–46.0)
Hemoglobin: 10.2 g/dL — ABNORMAL LOW (ref 12.0–15.0)
Immature Granulocytes: 0 %
Lymphocytes Relative: 23 %
Lymphs Abs: 1.6 10*3/uL (ref 0.7–4.0)
MCH: 29.1 pg (ref 26.0–34.0)
MCHC: 32.4 g/dL (ref 30.0–36.0)
MCV: 90 fL (ref 80.0–100.0)
Monocytes Absolute: 0.7 10*3/uL (ref 0.1–1.0)
Monocytes Relative: 10 %
Neutro Abs: 4.3 10*3/uL (ref 1.7–7.7)
Neutrophils Relative %: 66 %
Platelet Count: 212 10*3/uL (ref 150–400)
RBC: 3.5 MIL/uL — ABNORMAL LOW (ref 3.87–5.11)
RDW: 15.5 % (ref 11.5–15.5)
WBC Count: 6.7 10*3/uL (ref 4.0–10.5)
nRBC: 0 % (ref 0.0–0.2)

## 2022-07-23 LAB — CMP (CANCER CENTER ONLY)
ALT: 16 U/L (ref 0–44)
AST: 19 U/L (ref 15–41)
Albumin: 3.8 g/dL (ref 3.5–5.0)
Alkaline Phosphatase: 44 U/L (ref 38–126)
Anion gap: 5 (ref 5–15)
BUN: 28 mg/dL — ABNORMAL HIGH (ref 8–23)
CO2: 29 mmol/L (ref 22–32)
Calcium: 9.4 mg/dL (ref 8.9–10.3)
Chloride: 105 mmol/L (ref 98–111)
Creatinine: 1.13 mg/dL — ABNORMAL HIGH (ref 0.44–1.00)
GFR, Estimated: 55 mL/min — ABNORMAL LOW (ref >60)
Glucose, Bld: 149 mg/dL — ABNORMAL HIGH (ref 70–99)
Potassium: 3.8 mmol/L (ref 3.5–5.1)
Sodium: 139 mmol/L (ref 135–145)
Total Bilirubin: 0.4 mg/dL (ref 0.3–1.2)
Total Protein: 8.2 g/dL — ABNORMAL HIGH (ref 6.5–8.1)

## 2022-07-23 MED ORDER — ACETAMINOPHEN 325 MG PO TABS
650.0000 mg | ORAL_TABLET | Freq: Once | ORAL | Status: AC
  Administered 2022-07-23: 650 mg via ORAL
  Filled 2022-07-23: qty 2

## 2022-07-23 MED ORDER — TRASTUZUMAB-ANNS CHEMO 150 MG IV SOLR
6.0000 mg/kg | Freq: Once | INTRAVENOUS | Status: AC
  Administered 2022-07-23: 630 mg via INTRAVENOUS
  Filled 2022-07-23: qty 30

## 2022-07-23 MED ORDER — HEPARIN SOD (PORK) LOCK FLUSH 100 UNIT/ML IV SOLN
500.0000 [IU] | Freq: Once | INTRAVENOUS | Status: AC | PRN
  Administered 2022-07-23: 500 [IU]

## 2022-07-23 MED ORDER — SODIUM CHLORIDE 0.9 % IV SOLN: Freq: Once | INTRAVENOUS | Status: AC

## 2022-07-23 MED ORDER — SODIUM CHLORIDE 0.9% FLUSH
10.0000 mL | INTRAVENOUS | Status: DC | PRN
  Administered 2022-07-23: 10 mL

## 2022-07-23 MED ORDER — DIPHENHYDRAMINE HCL 25 MG PO CAPS
25.0000 mg | ORAL_CAPSULE | Freq: Once | ORAL | Status: AC
  Administered 2022-07-23: 25 mg via ORAL
  Filled 2022-07-23: qty 1

## 2022-07-23 NOTE — Patient Instructions (Signed)
[facility]  Discharge Instructions: Thank you for choosing [facility] to provide your oncology and hematology care.   If you have a lab appointment with the Cancer Center, please go directly to the Cancer Center and check in at the registration area.   Wear comfortable clothing and clothing appropriate for easy access to any Portacath or PICC line.   We strive to give you quality time with your provider. You may need to reschedule your appointment if you arrive late (15 or more minutes).  Arriving late affects you and other patients whose appointments are after yours.  Also, if you miss three or more appointments without notifying the office, you may be dismissed from the clinic at the provider's discretion.      For prescription refill requests, have your pharmacy contact our office and allow 72 hours for refills to be completed.    Today you received the following chemotherapy and/or immunotherapy agents: [id number]-anns      To help prevent nausea and vomiting after your treatment, we encourage you to take your nausea medication as directed.  BELOW ARE SYMPTOMS THAT SHOULD BE REPORTED IMMEDIATELY: *FEVER GREATER THAN 100.4 F (38 C) OR HIGHER *CHILLS OR SWEATING *NAUSEA AND VOMITING THAT IS NOT CONTROLLED WITH YOUR NAUSEA MEDICATION *UNUSUAL SHORTNESS OF BREATH *UNUSUAL BRUISING OR BLEEDING *URINARY PROBLEMS (pain or burning when urinating, or frequent urination) *BOWEL PROBLEMS (unusual diarrhea, constipation, pain near the anus) TENDERNESS IN MOUTH AND THROAT WITH OR WITHOUT PRESENCE OF ULCERS (sore throat, sores in mouth, or a toothache) UNUSUAL RASH, SWELLING OR PAIN  UNUSUAL VAGINAL DISCHARGE OR ITCHING   Items with * indicate a potential emergency and should be followed up as soon as possible or go to the Emergency Department if any problems should occur.  Please show the CHEMOTHERAPY ALERT CARD or IMMUNOTHERAPY ALERT CARD at  check-in to the Emergency Department and triage nurse.  Should you have questions after your visit or need to cancel or reschedule your appointment, please contact [facility]  Dept: [phone]  and follow the prompts.  Office hours are 8:00 a.m. to 4:30 p.m. Monday - Friday. Please note that voicemails left after 4:00 p.m. may not be returned until the following business day.  We are closed weekends and major holidays. You have access to a nurse at all times for urgent questions. Please call the main number to the clinic Dept: [phone] and follow the prompts.   For any non-urgent questions, you may also contact your provider using MyChart. We now offer e-Visits for anyone 18 and older to request care online for non-urgent symptoms. For details visit [email or web address].   Also download the MyChart app! Go to the app store, search "MyChart", open the app, select [facility], and log in with your MyChart username and password.  Masks are optional in the cancer centers. If you would like for your care team to wear a mask while they are taking care of you, please let them know. You may have one support person who is at least 64 years old accompany you for your appointments. 

## 2022-07-23 NOTE — Progress Notes (Signed)
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 Encounter Time as defined by the Centers for Medicare and Medicaid Services includes, in addition to the face-to-face time of a patient visit (documented in the note above) non-face-to-face time: obtaining and reviewing outside history, ordering and reviewing medications, tests or procedures, care coordination (communications with other health care professionals or caregivers) and documentation in the medical record.

## 2022-07-23 NOTE — Progress Notes (Signed)
Ok to use ECHO from 03/04/22 for today's treatment per Dr. Chryl Heck.

## 2022-07-27 ENCOUNTER — Encounter: Payer: Self-pay | Admitting: Nurse Practitioner

## 2022-07-27 ENCOUNTER — Ambulatory Visit (INDEPENDENT_AMBULATORY_CARE_PROVIDER_SITE_OTHER): Payer: Medicare Other | Admitting: Nurse Practitioner

## 2022-07-27 VITALS — BP 130/90 | HR 68 | Ht <= 58 in | Wt 240.2 lb

## 2022-07-27 DIAGNOSIS — C50919 Malignant neoplasm of unspecified site of unspecified female breast: Secondary | ICD-10-CM

## 2022-07-27 DIAGNOSIS — K529 Noninfective gastroenteritis and colitis, unspecified: Secondary | ICD-10-CM

## 2022-07-27 DIAGNOSIS — S31809A Unspecified open wound of unspecified buttock, initial encounter: Secondary | ICD-10-CM

## 2022-07-27 NOTE — Patient Instructions (Signed)
We have placed an order to the [facility] located at [address]. [address]. [place] [address]. There phone # is [phone].  Use over the counter benefiber as follows: one tablespoon daily.   I appreciate the opportunity to care for you. Carl Best, CRNP

## 2022-07-27 NOTE — Progress Notes (Signed)
If patient strongly wishes to have a colonoscopy and her breast cancer is stable on therapy we do so, however would need to be done at the hospital given her BMI and risks for anesthesia. Could also consider adding EGD to the colonoscopy for her IDA workup, although again thought less likely GI tract in etiology, but if giving her anesthesia for colonoscopy could add this on at the same time to complete her evaluation. It may not be for another few months we can do the procedure given backlog at the hospital and limited access there, she needs to be aware of that. She will be contacted for scheduling once an opening arises.   Otherwise agree with wound care consult for chronic skin breakdown in gluteal fold.

## 2022-07-27 NOTE — Progress Notes (Signed)
07/27/2022 [name] [id number] 09-13-1956   Chief Complaint: Loose stools, discuss scheduling a colonoscopy   History of Present Illness: [name]. [name] is a 65 year old female with a past medical history of CHF, DVT on Xarelto, metastatic breast cancer initially diagnosed in 2012 s/p left breast lumpectomy and radiation with recurrence in 2021 previously treated with chemo (Trastuzumab/Pertuzumab/Carboplatin/Docetaxol) now on Herceptin targeted therapy, endometrial cancer s/p laparoscopic hysterectomy with bilateral salpingo-oophorectomy and sentinel lymph node biopsy 06/16/2016, chronic anemia, dysphagia and chronic abdominal pain.   She is well-known by Dr. Havery Moros.  Refer to his comprehensive office consult note for detailed history 07/02/2020.  She has chronic iron deficiency anemia and during her office visit 07/02/2020, it was thought her anemia was likely due from bone marrow involvement from her metastatic breasts cancer.  Dr. Havery Moros spoke with her oncologist Dr. Jana Hakim at that time and her IDA was less likely to be due to blood loss from her GI tract and endoscopic evaluation was deferred.  She presents today with complaints of chronic mud like stools once or twice daily.  She cannot recall the last time she passed a solid stool.  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  She has seen a dermatologist in the past but her gluteal fold wound continues to bleed and she places tissue to this area to absorb daily bright red bleeding.  She underwent a colonoscopy 03/05/2012 which showed diverticulosis, no evidence of microscopic colitis or IBD.  She denies having any dysphagia.  She has chronic mild epigastric soreness and sometimes develops upper abdominal spasms triggered by certain movements.  Eating does not trigger any abdominal pain.  She stopped taking Omeprazole more than 1  year ago.  She has chronic anemia.  She remains on oral iron.  Her most recent PET scan was 05/25/2022 which was stable without evidence of recurrent metastatic disease and no new intra-abdominal/pelvic pathology was identified.      Latest Ref Rng & Units 07/23/2022    8:04 AM 07/02/2022    8:02 AM 06/10/2022    8:32 AM  CBC  WBC 4.0 - 10.5 K/uL 6.7  5.2  6.0   Hemoglobin 12.0 - 15.0 g/dL 10.2  9.7  10.6   Hematocrit 36.0 - 46.0 % 31.5  30.5  32.5   Platelets 150 - 400 K/uL 212  181  204        Latest Ref Rng & Units 07/23/2022    8:04 AM 07/02/2022    8:02 AM 06/10/2022    8:32 AM  CMP  Glucose 70 - 99 mg/dL 149  136  154   BUN 8 - 23 mg/dL 28  30  44   Creatinine 0.44 - 1.00 mg/dL 1.13  1.02  1.05   Sodium 135 - 145 mmol/L 139  139  139   Potassium 3.5 - 5.1 mmol/L 3.8  3.6  3.5   Chloride 98 - 111 mmol/L 105  105  105   CO2 22 - 32 mmol/L '29  29  27   '$ Calcium 8.9 - 10.3 mg/dL 9.4  9.3  9.7   Total Protein 6.5 - 8.1 g/dL 8.2  7.7  8.1   Total Bilirubin 0.3 - 1.2 mg/dL 0.4  0.3  0.4   Alkaline Phos 38 - 126 U/L 44  43  50   AST 15 - 41 U/L  $'19  18  19   'Q$ ALT 0 - 44 U/L '16  15  16      '$ PET scan 05/25/2022: 1. Stable exam. No findings suspicious for recurrent FDG avid tumor or metastatic disease. 2. Stable left adrenal nodule measuring 1 cm with mild low level FDG uptake, likely benign adenoma. 3. Similar appearance of prominent external iliac and inguinal lymph nodes which exhibit mild low level FDG uptake below mediastinal blood pool activity. 4.  Aortic Atherosclerosis  Current Medications, Allergies, Past Medical History, Past Surgical History, Family History and Social History were reviewed in [employer] record.   Review of Systems:   Constitutional: Negative for fever, sweats, chills or weight loss.  Respiratory: Negative for shortness of breath.   Cardiovascular: Negative for chest pain, palpitations and leg swelling.  Gastrointestinal: See HPI.   Musculoskeletal: Negative for back pain or muscle aches.  Neurological: Negative for dizziness, headaches or paresthesias.    Physical Exam: LMP 05/18/2016  BP (!) 130/90 (BP Location: Right Arm, Patient Position: Sitting, Cuff Size: Large)   Pulse 68   Ht '4\' 10"'$  (1.473 m) Comment: height measured without shoes  Wt 240 lb 4 oz (109 kg)   LMP 05/18/2016   BMI 50.21 kg/m   Wt Readings from Last 3 Encounters:  07/27/22 240 lb 4 oz (109 kg)  07/23/22 242 lb 11.2 oz (110.1 kg)  07/10/22 238 lb (108 kg)     General: 65 year old female in no acute distress. Head: Normocephalic and atraumatic. Eyes: No scleral icterus. Conjunctiva pink . Ears: Normal auditory acuity. Mouth: Dentition intact. No ulcers or lesions.  Lungs: Clear throughout to auscultation. Heart: Regular rate and rhythm, no murmur. Abdomen: Soft, nontender and nondistended. No masses or hepatomegaly. Normal bowel sounds x 4 quadrants.  Rectal: Small external hemorrhoid.  Internal rectal exam not done. Gluteal fold with chronic open fissure (does not extend into the anus) photo:   Musculoskeletal: Symmetrical with no gross deformities. Extremities: No edema. Neurological: Alert oriented x 4. No focal deficits.  Psychological: Alert and cooperative. Normal mood and affect  Assessment and Recommendations:  29) 66 year old female with chronic nonbloody loose stools.  Colonoscopy 03/05/2012 without evidence of microscopic colitis/IBD.  Recent PET scan without new intra-abdominal/pelvic pathology to explain chronic diarrhea. -Patient wishes to schedule a colonoscopy, to discuss further with Dr. 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 Hg 10.2.  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 Patient wishes to purse a colonoscopy. Recent PET scan without intra-abdominal/pelvic pathology. In setting of metastatic breast cancer, defer colonoscopy recommendations to Dr. Havery Moros.    6) History of PE on Xarelto

## 2022-07-29 ENCOUNTER — Telehealth: Payer: Self-pay

## 2022-07-29 NOTE — Telephone Encounter (Signed)
Dr. Havery Moros had a cancellation at [facility] on 08/03/22. I called to offer patient the appt, but she states that she was not aware that he wanted to proceed. I read Dr. Doyne Keel addendum note from 10/9 office visit. Pt also already has a wound care appt on Monday. Pt is aware that we will keep her name on the hospital wait list and will contact her as appts become available. Pt verbalized understanding and had no concerns at the end of the call.

## 2022-07-30 NOTE — Progress Notes (Signed)
I spoke with patient and she did receive [name]'s call yesterday. Patient wishes to schedule an EGD and colonoscopy at [facility] with Dr. Havery Moros after she completes her appointment with the wound clinic.   I discussed the EGD/colonoscopy benefits and risks discussed including risk with sedation, risk of bleeding, perforation and infection.  [name], pls request [name] hold instructions to her cardiologist Dr. Aundra Dubin as she verified he refills her Xarelto for hx of DVT. THX

## 2022-08-03 ENCOUNTER — Encounter (HOSPITAL_BASED_OUTPATIENT_CLINIC_OR_DEPARTMENT_OTHER): Payer: Medicare Other | Attending: Internal Medicine | Admitting: Internal Medicine

## 2022-08-03 DIAGNOSIS — Z96653 Presence of artificial knee joint, bilateral: Secondary | ICD-10-CM | POA: Insufficient documentation

## 2022-08-03 DIAGNOSIS — Z90710 Acquired absence of both cervix and uterus: Secondary | ICD-10-CM | POA: Insufficient documentation

## 2022-08-03 DIAGNOSIS — Z923 Personal history of irradiation: Secondary | ICD-10-CM | POA: Diagnosis not present

## 2022-08-03 DIAGNOSIS — C50919 Malignant neoplasm of unspecified site of unspecified female breast: Secondary | ICD-10-CM | POA: Insufficient documentation

## 2022-08-03 DIAGNOSIS — L98411 Non-pressure chronic ulcer of buttock limited to breakdown of skin: Secondary | ICD-10-CM | POA: Diagnosis not present

## 2022-08-03 DIAGNOSIS — Z853 Personal history of malignant neoplasm of breast: Secondary | ICD-10-CM | POA: Diagnosis not present

## 2022-08-03 DIAGNOSIS — E11622 Type 2 diabetes mellitus with other skin ulcer: Secondary | ICD-10-CM | POA: Insufficient documentation

## 2022-08-03 DIAGNOSIS — Z9221 Personal history of antineoplastic chemotherapy: Secondary | ICD-10-CM | POA: Insufficient documentation

## 2022-08-03 DIAGNOSIS — L98412 Non-pressure chronic ulcer of buttock with fat layer exposed: Secondary | ICD-10-CM | POA: Insufficient documentation

## 2022-08-06 NOTE — Progress Notes (Signed)
SUSETTE, SEMINARA ([id number]) [id number] Page 1 of 9 Visit Report for 08/03/2022 Allergy List Details Patient Name: Date of Service: Gonzella Lex 08/03/2022 8:00 A M Medical Record Number: [id number] Patient Account Number: [id number] Date of Birth/Sex: Treating RN: [birth year] (66 y.o. [name], [name] Tiras Bianchini: Cathlean Cower Other Clinician: Referring Chavis Tessler: Treating Ludie Pavlik/Extender: Hollie Beach in Treatment: 0 Allergies Active Allergies morphine Reaction: nausea Severity: Moderate codeine Reaction: nausea Severity: Moderate Cymbalta Reaction: not tolerated Darvon Reaction: nausea hydrocodone Reaction: nausea oxycodone Reaction: nausea rosuvastatin Reaction: bone pain Allergy Notes Electronic Signature(s) Signed: 08/06/2022 4:05:37 PM By: Rhae Hammock RN Entered By: Rhae Hammock on 08/03/2022 08:11:51 -------------------------------------------------------------------------------- Arrival Information Details Patient Name: Date of Service: [name], [name] 08/03/2022 8:00 A M Medical Record Number: [id number] Patient Account Number: [id number] Date of Birth/Sex: Treating RN: November 02, 1955 (66 y.o. [name], [name] Currie Dennin: Cathlean Cower Other Clinician: Referring Cassy Sprowl: Treating Jenafer Winterton/Extender: Hollie Beach in Treatment: 0 Visit Information Patient Arrived: Lyndel Pleasure Time: 08:10 Accompanied By: self Transfer Assistance: None Patient Identification VerifiedDALIA, JOLLIE ([id number]) Secondary Verification Process Completed: Yes Patient Requires Transmission-Based Precautions: No Patient Has Alerts: No History Since Last Visit Added or deleted any medications: No Any new allergies or adverse reactions: No Had a fall or experienced change in activities of daily living that may affect risk of falls:  No Signs or symptoms of abuse/neglect since last visito No [phone].pdf Page 2 of 9 Signs or symptoms of abuse/neglect since last visito No Hospitalized since last visit: No Implantable device outside of the clinic excluding cellular tissue based products placed in the center since last visit: No Pain Present Now: No Electronic Signature(s) Signed: 08/06/2022 4:05:37 PM By: Rhae Hammock RN Entered By: Rhae Hammock on 08/03/2022 08:10:34 -------------------------------------------------------------------------------- Clinic Level of Care Assessment Details Patient Name: Date of Service: [facility] [name] 08/03/2022 8:00 A M Medical Record Number: [id number] Patient Account Number: [id number] Date of Birth/Sex: Treating RN: 27-Feb-1956 (66 y.o. [name], [name] Haytham Maher: Cathlean Cower Other Clinician: Referring Kanasia Gayman: Treating Karah Caruthers/Extender: Hollie Beach in Treatment: 0 Clinic Level of Care Assessment Items TOOL 4 Quantity Score X- 1 0 Use when only an EandM is performed on FOLLOW-UP visit ASSESSMENTS - Nursing Assessment / Reassessment X- 1 10 Reassessment of Co-morbidities (includes updates in patient status) X- 1 5 Reassessment of Adherence to Treatment Plan ASSESSMENTS - Wound and Skin A ssessment / Reassessment X - Simple Wound Assessment / Reassessment - one wound 1 5 '[]'$  - 0 Complex Wound Assessment / Reassessment - multiple wounds '[]'$  - 0 Dermatologic / Skin Assessment (not related to wound area) ASSESSMENTS - Focused Assessment '[]'$  - 0 Circumferential Edema Measurements - multi extremities '[]'$  - 0 Nutritional Assessment / Counseling / Intervention '[]'$  - 0 Lower Extremity Assessment (monofilament, tuning fork, pulses) '[]'$  - 0 Peripheral Arterial Disease Assessment (using hand held doppler) ASSESSMENTS - Ostomy and/or Continence Assessment and Care '[]'$  - 0 Incontinence Assessment and  Management '[]'$  - 0 Ostomy Care Assessment and Management (repouching, etc.) PROCESS - Coordination of Care X - Simple Patient / Family Education for ongoing care 1 15 '[]'$  - 0 Complex (extensive) Patient / Family Education for ongoing care X- 1 10 Staff obtains Programmer, systems, Records, T Results / Process Orders est '[]'$  - 0 Staff telephones HHA, Nursing Homes / Clarify orders / etc '[]'$  - 0 Routine Transfer to another Facility (  non-emergent condition) '[]'$  - 0 Routine Hospital Admission (non-emergent condition) X- 1 15 New Admissions / Biomedical engineer / Ordering NPWT Apligraf, etc. , '[]'$  - 0 Emergency Hospital Admission (emergent condition) X- 1 10 Simple Discharge Coordination '[]'$  - 0 Complex (extensive) Discharge Coordination AILIE, GAGE V ([id number]) [phone].pdf Page 3 of 9 PROCESS - Special Needs '[]'$  - 0 Pediatric / Minor Patient Management '[]'$  - 0 Isolation Patient Management '[]'$  - 0 Hearing / Language / Visual special needs '[]'$  - 0 Assessment of Community assistance (transportation, D/C planning, etc.) '[]'$  - 0 Additional assistance / Altered mentation '[]'$  - 0 Support Surface(s) Assessment (bed, cushion, seat, etc.) INTERVENTIONS - Wound Cleansing / Measurement X - Simple Wound Cleansing - one wound 1 5 '[]'$  - 0 Complex Wound Cleansing - multiple wounds X- 1 5 Wound Imaging (photographs - any number of wounds) '[]'$  - 0 Wound Tracing (instead of photographs) X- 1 5 Simple Wound Measurement - one wound '[]'$  - 0 Complex Wound Measurement - multiple wounds INTERVENTIONS - Wound Dressings X - Small Wound Dressing one or multiple wounds 1 10 '[]'$  - 0 Medium Wound Dressing one or multiple wounds '[]'$  - 0 Large Wound Dressing one or multiple wounds '[]'$  - 0 Application of Medications - topical '[]'$  - 0 Application of Medications - injection INTERVENTIONS - Miscellaneous '[]'$  - 0 External ear exam '[]'$  - 0 Specimen Collection (cultures, biopsies, blood,  body fluids, etc.) '[]'$  - 0 Specimen(s) / Culture(s) sent or taken to Lab for analysis '[]'$  - 0 Patient Transfer (multiple staff / Civil Service fast streamer / Similar devices) '[]'$  - 0 Simple Staple / Suture removal (25 or less) '[]'$  - 0 Complex Staple / Suture removal (26 or more) '[]'$  - 0 Hypo / Hyperglycemic Management (close monitor of Blood Glucose) '[]'$  - 0 Ankle / Brachial Index (ABI) - do not check if billed separately X- 1 5 Vital Signs Has the patient been seen at the hospital within the last three years: Yes Total Score: 100 Level Of Care: New/Established - Level 3 Electronic Signature(s) Signed: 08/06/2022 4:05:37 PM By: Rhae Hammock RN Entered By: Rhae Hammock on 08/03/2022 08:59:06 -------------------------------------------------------------------------------- Encounter Discharge Information Details Patient Name: Date of Service: [name], [name] 08/03/2022 8:00 A M Medical Record Number: [id number] Patient Account Number: [id number] Date of Birth/Sex: Treating RN: 1956-07-11 (66 y.o. [name], [name] Shiri Hodapp: Cathlean Cower Other Clinician: Referring Mareta Chesnut: Treating Nazanin Kinner/Extender: Hollie Beach in Treatment: 0 SEYLAH, WERNERT ([id number]) [id number] Page 4 of 9 Encounter Discharge Information Items Discharge Condition: Stable Ambulatory Status: Cane Discharge Destination: Home Transportation: Private Auto Accompanied By: self Schedule Follow-up Appointment: Yes Clinical Summary of Care: Patient Declined Electronic Signature(s) Signed: 08/06/2022 4:05:37 PM By: Rhae Hammock RN Entered By: Rhae Hammock on 08/03/2022 09:00:18 -------------------------------------------------------------------------------- Lower Extremity Assessment Details Patient Name: Date of Service: [name] 08/03/2022 8:00 A M Medical Record Number: [id number] Patient Account Number:  [id number] Date of Birth/Sex: Treating RN: 01-27-1956 (66 y.o. [name], [name] Zandon Talton: Cathlean Cower Other Clinician: Referring Jermy Couper: Treating Sugey Trevathan/Extender: Hollie Beach in Treatment: 0 Electronic Signature(s) Signed: 08/06/2022 4:05:37 PM By: Rhae Hammock RN Entered By: Rhae Hammock on 08/03/2022 08:16:16 -------------------------------------------------------------------------------- Multi Wound Chart Details Patient Name: Date of Service: [name], [name] 08/03/2022 8:00 A M Medical Record Number: [id number] Patient Account Number: [id number] Date of Birth/Sex: Treating RN: 1955-10-23 (66 y.o. F) Primary Care Gurjot Brisco: Cathlean Cower Other Clinician: Referring  Alayzia Pavlock: Treating Laurin Morgenstern/Extender: Hollie Beach in Treatment: 0 Vital Signs Height(in): Pulse(bpm): 73 Weight(lbs): Blood Pressure(mmHg): 124/74 Body Mass Index(BMI): Temperature(F): 98.2 Respiratory Rate(breaths/min): 17 [2:Photos:] [N/A:N/A] Sacrum N/A N/A Wound Location: Gradually Appeared N/A N/A Wounding Event: MASD N/A N/A Primary EtiologyALEYSIA, OLTMANN ([id number]) [phone].pdf Page 5 of 9 Anemia, Lymphedema, Sleep Apnea, N/A N/A Comorbid History: Hypertension, Type II Diabetes, Osteoarthritis, Received Radiation 10/19/2005 N/A N/A Date Acquired: 0 N/A N/A Weeks of Treatment: Open N/A N/A Wound Status: No N/A N/A Wound Recurrence: 9x0.4x0.2 N/A N/A Measurements L x W x D (cm) 2.827 N/A N/A A (cm) : rea 0.565 N/A N/A Volume (cm) : Full Thickness With Exposed Support N/A N/A Classification: Structures Medium N/A N/A Exudate Amount: Serosanguineous N/A N/A Exudate Type: red, brown N/A N/A Exudate Color: Distinct, outline attached N/A N/A Wound Margin: Large (67-100%) N/A N/A Granulation Amount: Red, Pink N/A N/A Granulation Quality: Small (1-33%) N/A  N/A Necrotic Amount: Fat Layer (Subcutaneous Tissue): Yes N/A N/A Exposed Structures: Fascia: No Tendon: No Muscle: No Joint: No Bone: No Small (1-33%) N/A N/A Epithelialization: Excoriation: No N/A N/A Periwound Skin Texture: Induration: No Callus: No Crepitus: No Rash: No Scarring: No Maceration: Yes N/A N/A Periwound Skin Moisture: Dry/Scaly: No Atrophie Blanche: No N/A N/A Periwound Skin Color: Cyanosis: No Ecchymosis: No Erythema: No Hemosiderin Staining: No Mottled: No Pallor: No Rubor: No No Abnormality N/A N/A Temperature: Yes N/A N/A Tenderness on Palpation: Treatment Notes Wound #2 (Sacrum) Cleanser Anasept Antimicrobial Skin and Wound Cleanser, 8 (oz) Discharge Instruction: Cleanse the wound with Anasept cleanser prior to applying a clean dressing using gauze sponges, not tissue or cotton balls. Anasept Antimicrobial Skin and Wound Cleanser, 8 (oz) Discharge Instruction: Cleanse the wound with Anasept cleanser prior to applying a clean dressing using gauze sponges, not tissue or cotton balls. Peri-Wound Care Gentell AandD+E Ointment, Tube 2 (oz) Topical Primary Dressing KerraCel Ag Gelling Fiber Dressing, 4x5 in (silver alginate) Discharge Instruction: Apply silver alginate to wound bed as instructed Secondary Dressing Woven Gauze Sponge, Non-Sterile 4x4 in Discharge Instruction: Apply over primary dressing as directed. Zetuvit Plus Silicone Border Dressing 7x7(in/in) Discharge Instruction: Apply silicone border over primary dressing as directed. Secured With Compression Wrap Compression Stockings Add-Ons Electronic Signature(s) CORAIMA, TIBBS ([id number]) [id number] Page 6 of 9 Signed: 08/03/2022 9:25:17 AM By: Kalman Shan DO Entered By: Kalman Shan on 08/03/2022 09:21:09 -------------------------------------------------------------------------------- Multi-Disciplinary Care Plan Details Patient  Name: Date of Service: [name], [name] 08/03/2022 8:00 A M Medical Record Number: [id number] Patient Account Number: [id number] Date of Birth/Sex: Treating RN: February 12, 1956 (66 y.o. [name], [name] Treshun Wold: Cathlean Cower Other Clinician: Referring Shoua Ressler: Treating Aneta Hendershott/Extender: Hollie Beach in Treatment: 0 Active Inactive Orientation to the Wound Care Program Nursing Diagnoses: Knowledge deficit related to the wound healing center program Goals: Patient/caregiver will verbalize understanding of the [place] Date Initiated: 08/03/2022 Target Resolution Date: 08/22/2022 Goal Status: Active Interventions: Provide education on orientation to the wound center Notes: Wound/Skin Impairment Nursing Diagnoses: Impaired tissue integrity Knowledge deficit related to ulceration/compromised skin integrity Goals: Patient will have a decrease in wound volume by X% from date: (specify in notes) Date Initiated: 08/03/2022 Target Resolution Date: 08/22/2022 Goal Status: Active Patient/caregiver will verbalize understanding of skin care regimen Date Initiated: 08/03/2022 Target Resolution Date: 08/22/2022 Goal Status: Active Ulcer/skin breakdown will have a volume reduction of 30% by week 4 Date Initiated: 08/03/2022 Target Resolution Date: 08/22/2022 Goal Status: Active Interventions:  Assess patient/caregiver ability to obtain necessary supplies Assess patient/caregiver ability to perform ulcer/skin care regimen upon admission and as needed Assess ulceration(s) every visit Notes: Electronic Signature(s) Signed: 08/06/2022 4:05:37 PM By: Rhae Hammock RN Entered By: Rhae Hammock on 08/03/2022 08:53:40 [name] ([id number]) [id number] Page 7 of 9 -------------------------------------------------------------------------------- Pain Assessment Details Patient Name: Date of  Service: [name] 08/03/2022 8:00 A M Medical Record Number: [id number] Patient Account Number: [id number] Date of Birth/Sex: Treating RN: April 20, 1956 (66 y.o. [name], [name] Brogen Duell: Cathlean Cower Other Clinician: Referring Brenlyn Beshara: Treating Rosemaria Inabinet/Extender: Hollie Beach in Treatment: 0 Active Problems Location of Pain Severity and Description of Pain Patient Has Paino No Site Locations Pain Management and Medication Current Pain Management: Electronic Signature(s) Signed: 08/06/2022 4:05:37 PM By: Rhae Hammock RN Entered By: Rhae Hammock on 08/03/2022 08:16:24 -------------------------------------------------------------------------------- Patient/Caregiver Education Details Patient Name: Date of Service: Gonzella Lex 10/16/2023andnbsp8:00 [place] Record Number: [id number] Patient Account Number: [id number] Date of Birth/Gender: Treating RN: 01-11-1956 (66 y.o. [name], [name] Physician: Cathlean Cower Other Clinician: Referring Physician: Treating Physician/Extender: Hollie Beach in Treatment: 0 Education Assessment Education Provided To: Patient Education Topics Provided [place]: o Methods: Explain/Verbal Responses: Reinforcements needed, State content correctly Electronic Signature(s) Signed: 08/06/2022 4:05:37 PM By: Rhae Hammock RN Entered By: Rhae Hammock on 08/03/2022 08:53:50 [name] ([id number]) [id number] Page 8 of 9 -------------------------------------------------------------------------------- Wound Assessment Details Patient Name: Date of Service: [name] 08/03/2022 8:00 A M Medical Record Number: [id number] Patient Account Number: [id number] Date of Birth/Sex: Treating RN: 02-08-1956 (66 y.o. F) Primary Care Jsoeph Podesta: Cathlean Cower Other  Clinician: Referring Abbigaile Rockman: Treating Shaquaya Wuellner/Extender: Hollie Beach in Treatment: 0 Wound Status Wound Number: 2 Primary MASD Etiology: Wound Location: Sacrum Wound Open Wounding Event: Gradually Appeared Status: Date Acquired: 10/19/2005 Comorbid Anemia, Lymphedema, Sleep Apnea, Hypertension, Type II Weeks Of Treatment: 0 History: Diabetes, Osteoarthritis, Received Radiation Clustered Wound: No Photos Wound Measurements Length: (cm) 9 Width: (cm) 0.4 Depth: (cm) 0.2 Area: (cm) 2.827 Volume: (cm) 0.565 % Reduction in Area: % Reduction in Volume: Epithelialization: Small (1-33%) Tunneling: No Undermining: No Wound Description Classification: Full Thickness With Exposed Suppor Wound Margin: Distinct, outline attached Exudate Amount: Medium Exudate Type: Serosanguineous Exudate Color: red, brown t Structures Foul Odor After Cleansing: No Slough/Fibrino Yes Wound Bed Granulation Amount: Large (67-100%) Exposed Structure Granulation Quality: Red, Pink Fascia Exposed: No Necrotic Amount: Small (1-33%) Fat Layer (Subcutaneous Tissue) Exposed: Yes Tendon Exposed: No Muscle Exposed: No Joint Exposed: No Bone Exposed: No Periwound Skin Texture Texture Color No Abnormalities Noted: No No Abnormalities Noted: No Callus: No Atrophie Blanche: No Crepitus: No Cyanosis: No Excoriation: No Ecchymosis: No Induration: No Erythema: No Rash: No Hemosiderin Staining: No Scarring: No Mottled: No Pallor: No Moisture Rubor: No No Abnormalities Noted: No Dry / Scaly: No Temperature / Pain [name], Krisanne V ([id number]) [phone].pdf Page 9 of 9 Maceration: Yes Temperature: No Abnormality Tenderness on Palpation: Yes Treatment Notes Wound #2 (Sacrum) Cleanser Anasept Antimicrobial Skin and Wound Cleanser, 8 (oz) Discharge Instruction: Cleanse the wound with Anasept cleanser prior to applying a clean dressing using  gauze sponges, not tissue or cotton balls. Anasept Antimicrobial Skin and Wound Cleanser, 8 (oz) Discharge Instruction: Cleanse the wound with Anasept cleanser prior to applying a clean dressing using gauze sponges, not tissue or cotton balls. Peri-Wound Care Gentell AandD+E Ointment, Tube  2 (oz) Topical Primary Dressing KerraCel Ag Gelling Fiber Dressing, 4x5 in (silver alginate) Discharge Instruction: Apply silver alginate to wound bed as instructed Secondary Dressing Woven Gauze Sponge, Non-Sterile 4x4 in Discharge Instruction: Apply over primary dressing as directed. Zetuvit Plus Silicone Border Dressing 7x7(in/in) Discharge Instruction: Apply silicone border over primary dressing as directed. Secured With Compression Wrap Compression Stockings Environmental education officer) Signed: 08/03/2022 9:25:17 AM By: Kalman Shan DO Entered By: Kalman Shan on 08/03/2022 08:47:53 -------------------------------------------------------------------------------- Vitals Details Patient Name: Date of Service: [name], [name] 08/03/2022 8:00 A M Medical Record Number: [id number] Patient Account Number: [id number] Date of Birth/Sex: Treating RN: 09/23/1956 (66 y.o. [name], [name] Gustavo Dispenza: Cathlean Cower Other Clinician: Referring Brighton Pilley: Treating Gabriella Woodhead/Extender: Hollie Beach in Treatment: 0 Vital Signs Time Taken: 08:11 Temperature (F): 98.2 Pulse (bpm): 73 Respiratory Rate (breaths/min): 17 Blood Pressure (mmHg): 124/74 Reference Range: 80 - 120 mg / dl Electronic Signature(s) Signed: 08/06/2022 4:05:37 PM By: Rhae Hammock RN Entered By: Rhae Hammock on 08/03/2022 08:13:34

## 2022-08-06 NOTE — Progress Notes (Signed)
[name] ([id number]) [id number] Page 1 of 8 Visit Report for 08/03/2022 Chief Complaint Document Details Patient Name: Date of Service: [name] 08/03/2022 8:00 A M Medical Record Number: [id number] Patient Account Number: [id number] Date of Birth/Sex: Treating RN: 03-04-1956 (66 y.o. F) Primary Care Provider: Cathlean Young Other Clinician: Referring Provider: Treating Provider/Extender: [name] in Treatment: 0 Information Obtained from: Patient Chief Complaint 08/03/2022; several year history of waxing and waning to a wound to the intergluteal cleft Electronic Signature(s) Signed: 08/03/2022 9:25:17 AM By: Kalman Shan DO Entered By: Kalman Shan on 08/03/2022 09:23:29 -------------------------------------------------------------------------------- HPI Details Patient Name: Date of Service: [name], [name]. 08/03/2022 8:00 A M Medical Record Number: [id number] Patient Account Number: [id number] Date of Birth/Sex: Treating RN: 09/14/1956 (65 y.o. F) Primary Care Provider: Cathlean Young Other Clinician: Referring Provider: Treating Provider/Extender: [name] in Treatment: 0 History of Present Illness HPI Description: 11/02/2019 ADMISSION This is a 66 year old woman who has a wound on the right posterior Achilles area towards the distal end of the Achilles tendon surgery scars. She tells me that roughly 2-1/2 to 3 weeks ago she was picking at some dry skin in the area pulled it off and that is how the wound opened up. She had an original spontaneous Achilles tendon rupture in the early 1990s that was repaired and then very shortly thereafter it reruptured and she required another surgery. She has not had any problems with this since then. The wound itself currently has episodic sharp pain some swelling. She was put on Augmentin by her primary physician  on 10/30/2019 for possible coexistent cellulitis. She used hydrogen peroxide for a while with topical antibiotics. Mostly she has been using topical antibiotics as somebody told her that hydrogen peroxide was not good to use. 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 Surgery was in [place], bilateral total knee replacements. ABIs in our clinic on the right were 1.09 1/21; patient here with a linear wound in the right Achilles area. Her wrap fell down she did not call us to replace it. She has questions about her compression wrap necessity. Finally she asked if the wound could be sutured The patient is under a lot of pressure with initiation of chemotherapy she is going to have a port placed etc. 1/28; linear wound in the right Achilles area in the setting of previous surgery. She is going for a port for chemotherapy for her breast cancer. She has apparently made herself an appointment with orthopedic surgery. I am not sure who she has made an appointment with at this point. 2/12 linear wound in the right Achilles is come in in terms of length. This is in the setting of previous surgery in this area The patient is undergoing chemotherapy for breast cancer and having significant side effects [diarrhea] I am trying not to bring in her in too often as long as her wound is contracted 3/5; linear wound in the right Achilles. Not quite as long however still some measurable depth. She tells [place] that she has had problems with chemotherapy also apparently pseudomembranous colitis. She has not had much attention to paid to the wound. Came in without any  dressing on etc. 4/16; right Achilles. She is completely closed 08/03/2022 [name] is a 66 year old female with a past medical history of left sided breast cancer currently on chemotherapy, morbid obesity and [name], [name] ([id number]) [id number] Page 2 of 8 currently controlled type 2 diabetes that presents the clinic for a wound to her intergluteal cleft. She states that this is an area that [place] and wanes in healing. She does report sitting for most of the day. She has been using zinc oxide to the area. She currently denies signs of infection. Electronic Signature(s) Signed: 08/03/2022 9:25:17 AM By: Kalman Shan DO Entered By: Kalman Shan on 08/03/2022 09:25:43 -------------------------------------------------------------------------------- Physical Exam Details Patient Name: Date of Service: [name]. 08/03/2022 8:00 A M Medical Record Number: [id number] Patient Account Number: [id number] Date of Birth/Sex: Treating RN: 22-Dec-1955 (66 y.o. F) Primary Care Provider: Cathlean Young Other Clinician: Referring Provider: Treating Provider/Extender: [name] in Treatment: 0 Constitutional respirations regular, non-labored and within target range for patient.[name] Psychiatric pleasant and cooperative. Notes T the intergluteal cleft there is a elongated wound with granulation tissue throughout. No signs of infection. Excess moisture noted. no signs of infection. o Electronic Signature(s) Signed: 08/03/2022 9:25:17 AM By: Kalman Shan DO Entered By: Kalman Shan on 08/03/2022 09:26:22 -------------------------------------------------------------------------------- Physician Orders Details Patient Name: Date of Service: [name], [name]. 08/03/2022 8:00 A M Medical Record Number: [id number] Patient Account Number: [id number] Date of Birth/Sex: Treating RN: [birth year] (66 y.o. 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 Bathing/ Shower/ Hygiene May shower with protection but do not get wound dressing(s) wet. Off-Loading Turn and reposition every 2 hours Wound Treatment Wound #2 [name], [name] ([id number]) [id number] Page 3 of 8 Cleanser: Anasept Antimicrobial Skin and Wound Cleanser, 8 (oz) (DME) (Generic) 2 x Per Day/15 Days Discharge Instructions: Cleanse the wound with Anasept cleanser prior to applying a clean dressing using gauze sponges, not tissue or cotton balls. Cleanser: Anasept Antimicrobial Skin and Wound Cleanser, 8 (oz) (DME) (Generic) 2 x Per Day/15 Days Discharge Instructions: Cleanse the wound with Anasept cleanser prior to applying a clean dressing using gauze sponges, not tissue or cotton balls. Peri-Wound Care: Gentell AandD+E Ointment, Tube 2 (oz) 2 x Per Day/15 Days Prim Dressing: KerraCel Ag Gelling Fiber Dressing, 4x5 in (silver alginate) (DME) (Generic) 2 x Per Day/15 Days ary Discharge Instructions: Apply silver alginate to wound bed as instructed Secondary Dressing: Woven Gauze Sponge, Non-Sterile 4x4 in (DME) (Generic) 2 x Per Day/15 Days Discharge Instructions: Apply over primary dressing as directed. Secondary Dressing: Zetuvit Plus Silicone Border Dressing 7x7(in/in) (DME) (Generic) 2 x Per Day/15 Days Discharge Instructions: Apply silicone border over primary dressing as directed. Electronic Signature(s) Signed: 08/03/2022 9:25:17 AM By: Kalman Shan DO Entered By: Kalman Shan on 08/03/2022  09:26:30 -------------------------------------------------------------------------------- Problem List Details Patient Name: Date of Service: [name], [name]. 08/03/2022 8:00 A M Medical Record Number: [id number] Patient Account Number: [id number] Date of Birth/Sex: Treating RN: [birth year] (66 y.o. F) Primary Care Provider: Cathlean Young Other Clinician: Referring Provider: Treating Provider/Extender: [name] in Treatment: 0 Active Problems ICD-10  Encounter Code Description Active Date MDM Diagnosis E11.622 Type 2 diabetes mellitus with other skin ulcer 08/03/2022 No Yes L98.412 Non-pressure chronic ulcer of buttock with fat layer exposed 08/03/2022 No Yes Inactive Problems Resolved Problems Electronic Signature(s) Signed: 08/03/2022 9:25:17 AM By: Kalman Shan DO Entered By: Kalman Shan on 08/03/2022 09:21:04 -------------------------------------------------------------------------------- Progress Note Details Patient Name: Date of Service: [name], [name]. 08/03/2022 8:00 A M Medical Record Number: [id number] Patient Account Number: [id number] [name], Jacqueline ([id number]) [id number].pdf Page 4 of 8 Date of Birth/Sex: Treating RN: 12/28/1955 (66 y.o. F) Primary Care Provider: Cathlean Young Other Clinician: Referring Provider: Treating Provider/Extender: [name] in Treatment: 0 Subjective Chief Complaint Information obtained from Patient 08/03/2022; several year history of waxing and waning to a wound to the intergluteal cleft History of Present Illness (HPI) 11/02/2019 ADMISSION This is a 66 year old woman who has a wound on the right posterior Achilles area towards the distal end of the Achilles tendon surgery scars. She tells me that roughly 2-1/2 to 3 weeks ago she was picking at some dry skin in the area pulled it off and that is how the wound opened up. She had an  original spontaneous Achilles tendon rupture in the early 1990s that was repaired and then very shortly thereafter it reruptured and she required another surgery. She has not had any problems with this since then. The wound itself currently has episodic sharp pain some swelling. She was put on Augmentin by her primary physician on 10/30/2019 for possible coexistent cellulitis. She used hydrogen peroxide for a while with topical antibiotics. Mostly she has been using topical antibiotics as somebody told her that hydrogen peroxide was not good to use. 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 Surgery was in [place], bilateral total knee replacements. ABIs in our clinic on the right were 1.09 1/21; patient here with a linear wound in the right Achilles area. Her wrap fell down she did not call us to replace it. She has questions about her compression wrap necessity. Finally she asked if the wound could be sutured The patient is under a lot of pressure with initiation of chemotherapy she is going to have a port placed etc. 1/28; linear wound in the right Achilles area in the setting of previous surgery. She is going for a port for chemotherapy for her breast cancer. She has apparently made herself an appointment with orthopedic surgery. I am not sure who she has made an appointment with at this point. 2/12 linear wound in the right Achilles is come in in terms of length. This is in the setting of previous surgery in this area The patient is undergoing chemotherapy for breast cancer and having significant side effects [diarrhea] I am trying not to bring in her in too often as long as her wound is contracted 3/5; linear wound in  the right Achilles. Not quite as long however still some measurable depth. She tells [place] that she has had problems with chemotherapy also apparently pseudomembranous colitis. She has not had much attention to paid to the wound. Came in without any dressing on etc. 4/16; right Achilles. She is completely closed 08/03/2022 [name] is a 66 year old female with a past  medical history of left sided breast cancer currently on chemotherapy, morbid obesity and currently controlled type 2 diabetes that presents the clinic for a wound to her intergluteal cleft. She states that this is an area that [place] and wanes in healing. She does report sitting for most of the day. She has been using zinc oxide to the area. She currently denies signs of infection. Patient History Information obtained from Patient. Allergies morphine (Severity: Moderate, Reaction: nausea), codeine (Severity: Moderate, Reaction: nausea), Cymbalta (Reaction: not tolerated), Darvon (Reaction: nausea), hydrocodone (Reaction: nausea), oxycodone (Reaction: nausea), rosuvastatin (Reaction: bone pain) Family History Diabetes - Paternal Grandparents,Mother,Father, Heart Disease - Father, Hypertension - Mother,Father, No family history of Cancer, Hereditary Spherocytosis, Kidney Disease, Lung Disease, Seizures, Stroke, Thyroid Problems, Tuberculosis. 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 Medical History Hematologic/Lymphatic Patient has history of Anemia, Lymphedema Respiratory Patient has history of Sleep Apnea - no CPAP Cardiovascular Patient has history of Hypertension Endocrine Patient has history of Type II Diabetes Genitourinary Denies history of End Stage Renal Disease Integumentary (Skin) Denies history of History of Burn Musculoskeletal Patient has history of Osteoarthritis Oncologic Patient has history of  Received Radiation Denies history of Received Chemotherapy Psychiatric Denies history of Anorexia/bulimia, Confinement Anxiety Hospitalization/Surgery History - bilk knee replacements. - hysterectomy. - left breast lumpectomy. - left achilles repair. - right achilles repair. - c-section. - neck surgery-pinched spinal cord. Medical A Surgical History Notes nd Constitutional Symptoms (General Health) morbid obesity [name], [name] ([id number]) [id number] Page 5 of 8 Genitourinary urinary incontinence Oncologic hx left breast CA with recurring metastatic disease, hx uterine CA Objective Constitutional respirations regular, non-labored and within target range for patient.. Vitals Time Taken: 8:11 AM, Temperature: 98.2 F, Pulse: 73 bpm, Respiratory Rate: 17 breaths/min, Blood Pressure: 124/74 mmHg. Psychiatric pleasant and cooperative. General Notes: T the intergluteal cleft there is a elongated wound with granulation tissue throughout. No signs of infection. Excess moisture noted. no signs of o infection. Integumentary (Hair, Skin) Wound #2 status is Open. Original cause of wound was Gradually Appeared. The date acquired was: 10/19/2005. The wound is located on the Sacrum. The wound measures 9cm length x 0.4cm width x 0.2cm depth; 2.827cm^2 area and 0.565cm^3 volume. There is Fat Layer (Subcutaneous Tissue) exposed. There is no tunneling or undermining noted. There is a medium amount of serosanguineous drainage noted. The wound margin is distinct with the outline attached to the wound base. There is large (67-100%) red, pink granulation within the wound bed. There is a small (1-33%) amount of necrotic tissue within the wound bed. The periwound skin appearance exhibited: Maceration. The periwound skin appearance did not exhibit: Callus, Crepitus, Excoriation, Induration, Rash, Scarring, Dry/Scaly, Atrophie Blanche, Cyanosis, Ecchymosis, Hemosiderin Staining,  Mottled, Pallor, Rubor, Erythema. Periwound temperature was noted as No Abnormality. The periwound has tenderness on palpation. Assessment Active Problems ICD-10 Type 2 diabetes mellitus with other skin ulcer Non-pressure chronic ulcer of buttock with fat layer exposed Patient presents with a several year history of waxing and waning of a wound in her intergluteal cleft. This is caused by excess moisture and pressure. We discussed The importance of moisture control and pressure relief for her wound healing. I recommended AandD ointment to the periwound and silver alginate to the wound bed directly. I also recommended a wound cleanser between dressing changes. She needs to do dressing changes at least twice daily and as needed. We discussed  repositioning every hour to help with pressure relief. Follow-up in 1 week. Plan Follow-up Appointments: Return Appointment in 1 week. Bathing/ Shower/ Hygiene: May shower with protection but do not get wound dressing(s) wet. Off-Loading: Turn and reposition every 2 hours WOUND #2: - Sacrum Wound Laterality: Cleanser: Anasept Antimicrobial Skin and Wound Cleanser, 8 (oz) (DME) (Generic) 2 x Per Day/15 Days Discharge Instructions: Cleanse the wound with Anasept cleanser prior to applying a clean dressing using gauze sponges, not tissue or cotton balls. Cleanser: Anasept Antimicrobial Skin and Wound Cleanser, 8 (oz) (DME) (Generic) 2 x Per Day/15 Days Discharge Instructions: Cleanse the wound with Anasept cleanser prior to applying a clean dressing using gauze sponges, not tissue or cotton balls. Peri-Wound Care: Gentell AandD+E Ointment, Tube 2 (oz) 2 x Per Day/15 Days Prim Dressing: KerraCel Ag Gelling Fiber Dressing, 4x5 in (silver alginate) (DME) (Generic) 2 x Per Day/15 Days ary Discharge Instructions: Apply silver alginate to wound bed as instructed Secondary Dressing: Woven Gauze Sponge, Non-Sterile 4x4 in (DME) (Generic) 2 x Per Day/15  Days Discharge Instructions: Apply over primary dressing as directed. Secondary Dressing: Zetuvit Plus Silicone Border Dressing 7x7(in/in) (DME) (Generic) 2 x Per Day/15 Days Discharge Instructions: Apply silicone border over primary dressing as directed. 1. AandD ointment 2. Silver alginate [name], [name] ([id number]) [id number] Page 6 of 8 3. Anasept wound cleanser 4. Follow-up in 1 week Electronic Signature(s) Signed: 08/03/2022 9:25:17 AM By: Kalman Shan DO Entered By: Kalman Shan on 08/03/2022 09:30:45 -------------------------------------------------------------------------------- HxROS Details Patient Name: Date of Service: [name], [name]. 08/03/2022 8:00 A M Medical Record Number: [id number] Patient Account Number: [id number] Date of Birth/Sex: Treating RN: [birth year] (66 y.o. [name], [name] Primary Care Provider: Cathlean Young Other Clinician: Referring Provider: Treating Provider/Extender: [name] in Treatment: 0 Information Obtained From Patient Constitutional Symptoms ([place]) Medical History: Past Medical History Notes: morbid obesity Hematologic/Lymphatic Medical History: Positive for: Anemia; Lymphedema Respiratory Medical History: Positive for: Sleep Apnea - no CPAP Cardiovascular Medical History: Positive for: Hypertension Endocrine Medical History: Positive for: Type II Diabetes Time with diabetes: 5 yrs Treated with: Oral agents Blood sugar tested every day: No Genitourinary Medical History: Negative for: End Stage Renal Disease Past Medical History Notes: urinary incontinence Integumentary (Skin) Medical History: Negative for: History of Burn Musculoskeletal Medical History: Positive for: Osteoarthritis Oncologic Medical HistoryKARLISA, [name] ([id number]) [id number] Page 7 of 8 Positive for: Received  Radiation Negative for: Received Chemotherapy Past Medical History Notes: hx left breast CA with recurring metastatic disease, hx uterine CA Psychiatric Medical History: Negative for: Anorexia/bulimia; Confinement Anxiety Immunizations Pneumococcal Vaccine: Received Pneumococcal Vaccination: Yes Received Pneumococcal Vaccination On or After 60th Birthday: Yes Implantable Devices Yes Hospitalization / Surgery History Type of Hospitalization/Surgery bilk knee replacements hysterectomy left breast lumpectomy left achilles repair right achilles repair c-section neck surgery-pinched spinal cord Family and Social History Cancer: No; Diabetes: Yes - Paternal Grandparents,Mother,Father; Heart Disease: Yes - Father; Hereditary Spherocytosis: No; Hypertension: Yes - Mother,Father; Kidney Disease: No; Lung Disease: No; Seizures: No; Stroke: No; Thyroid Problems: No; Tuberculosis: No; Never smoker; Marital Status - Divorced; Alcohol Use: Rarely; Drug Use: Prior History - TCH; Caffeine Use: Daily - tea; Financial Concerns: No; Food, Clothing or Shelter Needs: No; Support System Lacking: No; Transportation Concerns: Yes - will have multiple appointments for breast CA Electronic Signature(s) Signed: 08/03/2022 9:25:17 AM By: Kalman Shan DO Signed: 08/06/2022 4:05:37 PM By: Rhae Hammock RN Entered By: Rhae Hammock on 08/03/2022 08:13:01 -------------------------------------------------------------------------------- SuperBill  Details Patient Name: Date of Service: [name] 08/03/2022 Medical Record Number: [id number] Patient Account Number: [id number] Date of Birth/Sex: Treating RN: [birth year] (66 y.o. [name], [name] Primary Care Provider: Cathlean Young Other Clinician: Referring Provider: Treating Provider/Extender: [name] in Treatment: 0 Diagnosis Coding ICD-10 Codes Code Description [phone] Type 2 diabetes mellitus with  other skin ulcer L98.412 Non-pressure chronic ulcer of buttock with fat layer exposed Facility Procedures : CPT4 Code: [id number] Description: 99213 - WOUND CARE VISIT-LEV 3 EST PT Modifier: Quantity: 1 Physician Procedures : CPT4 Code Description Modifier [id number] - WC PHYS LEVEL 4 - EST PT ICD-10 Diagnosis Description CRISS, BARTLES Young ([id number]) [id number] L98.412 Non-pressure chronic ulcer of buttock with fat layer exposed E11.622 Type 2  diabetes mellitus with other skin ulcer Quantity: 1 1227.pdf Page 8 of 8 Electronic Signature(s) Signed: 08/03/2022 9:25:17 AM By: Kalman Shan DO Entered By: Kalman Shan on 08/03/2022 09:31:06

## 2022-08-06 NOTE — Progress Notes (Signed)
MIASHA, EMMONS Young ([id number]) [id number] Page 1 of 4 Visit Report for 08/03/2022 Abuse Risk Screen Details Patient Name: Date of Service: [name]. 08/03/2022 8:00 A M Medical Record Number: [id number] Patient Account Number: [id number] Date of Birth/Sex: Treating RN: [birth year] (66 y.o. [name], [name] Primary Care [name]: [name] Other Clinician: Referring [name]: Treating [name] in Treatment: 0 Abuse Risk Screen Items Answer ABUSE RISK SCREEN: Has anyone close to you tried to hurt or harm you recentlyo No Do you feel uncomfortable with anyone in your familyo No Has anyone forced you do things that you didnt want to doo No Electronic Signature(s) Signed: 08/06/2022 4:05:37 PM By: Rhae Hammock RN Entered By: Rhae Hammock on 08/03/2022 08:13:45 -------------------------------------------------------------------------------- Activities of Daily Living Details Patient Name: Date of Service: [name]. 08/03/2022 8:00 A M Medical Record Number: [id number] Patient Account Number: [id number] Date of Birth/Sex: Treating RN: 1956/05/10 (66 y.o. [name], [name] Primary Care [name]: [name] Other Clinician: Referring [name]: Treating [name]/Extender: [name] in Treatment: 0 Activities of Daily Living Items Answer Activities of Daily Living (Please select one for each item) Drive Automobile Completely Able T Medications ake Completely Able Use T elephone Completely Able Care for Appearance Completely Able Use T oilet Completely Able Bath / Shower Completely Able Dress Self Completely Able Feed Self Completely Able Walk Completely Able Get In / Out Bed Completely Able Housework Completely Able Prepare Meals Completely [place] Completely Able Shop for Self Completely Able Electronic  Signature(s) Signed: 08/06/2022 4:05:37 PM By: Rhae Hammock RN Entered By: Rhae Hammock on 08/03/2022 08:14:02 [name] ([id number]) [id number] Page 2 of 4 -------------------------------------------------------------------------------- Education Screening Details Patient Name: Date of Service: [name]. 08/03/2022 8:00 A M Medical Record Number: [id number] Patient Account Number: [id number] Date of Birth/Sex: Treating RN: 01-03-1956 (66 y.o. [name], [name] Primary Care [name]: [name] Other Clinician: Referring [name]: Treating [name]/Extender: [name] in Treatment: 0 Primary Learner Assessed: Patient Learning Preferences/Education Level/Primary Language Learning Preference: Explanation, Demonstration, Communication Board, Printed Material Highest Education Level: High School Preferred Language: English Cognitive Barrier Language Barrier: No Translator Needed: No Memory Deficit: No Emotional Barrier: No Cultural/Religious Beliefs Affecting Medical Care: No Physical Barrier Impaired Vision: No Impaired Hearing: No Decreased Hand dexterity: No Knowledge/Comprehension Knowledge Level: High Comprehension Level: High Ability to understand written instructions: High Ability to understand verbal instructions: High Motivation Anxiety Level: Calm Cooperation: Cooperative Education Importance: Denies Need Interest in Health Problems: Asks Questions Perception: Coherent Willingness to Engage in Self-Management High Activities: Readiness to Engage in Self-Management High Activities: Electronic Signature(s) Signed: 08/06/2022 4:05:37 PM By: Rhae Hammock RN Entered By: Rhae Hammock on 08/03/2022 08:15:43 -------------------------------------------------------------------------------- Fall Risk Assessment Details Patient Name: Date of Service: [name], [name]. 08/03/2022 8:00 A M Medical Record Number: [id number] Patient Account Number: [id number] Date of Birth/Sex: Treating RN: 08/24/1956 (65 y.o. [name], [name] Primary Care [name]: [name] Other Clinician: Referring [name]: Treating [name]/Extender: [name] in Treatment: 0 Fall Risk Assessment Items Have you had 2 or more falls in the last 12 monthso 0 No [name], [name] ([id number]) [phone] [id number] Page 3 of 4 Have you had any fall that resulted in injury in the last 12 monthso 0 No FALLS RISK SCREEN History of falling - immediate or within 3 months 0 No Secondary diagnosis (Do you  have 2 or more medical diagnoseso) 0 No Ambulatory aid None/bed rest/wheelchair/nurse 0 No Crutches/cane/walker 0 No Furniture 0 No Intravenous therapy Access/Saline/Heparin Lock 0 No Gait/Transferring Normal/ bed rest/ wheelchair 0 No Weak (short steps with or without shuffle, stooped but able to lift head while walking, may seek 0 No support from furniture) Impaired (short steps with shuffle, may have difficulty arising from chair, head down, impaired 0 No balance) Mental Status Oriented to own ability 0 No Electronic Signature(s) Signed: 08/06/2022 4:05:37 PM By: Rhae Hammock RN Entered By: Rhae Hammock on 08/03/2022 08:15:50 -------------------------------------------------------------------------------- Foot Assessment Details Patient Name: Date of Service: [name], [name]. 08/03/2022 8:00 A M Medical Record Number: [id number] Patient Account Number: [id number] Date of Birth/Sex: Treating RN: August 29, 1956 (66 y.o. [name], [name] Primary Care [name]: [name] Other Clinician: Referring [name]: Treating [name]/Extender: [name] in Treatment: 0 Foot Assessment Items Site Locations + = Sensation present, - = Sensation absent, C = Callus, U =  Ulcer R = Redness, W = Warmth, M = Maceration, PU = Pre-ulcerative lesion F = Fissure, S = Swelling, D = Dryness Assessment Right: Left: Other Deformity: No No Prior Foot Ulcer: No No Prior Amputation: No No Charcot Joint: No No Ambulatory Status: GaitDHRUVI, [name] ([id number]) [phone] [id number] Page 4 of 4 Notes N/A no LE wounds Electronic Signature(s) Signed: 08/06/2022 4:05:37 PM By: Rhae Hammock RN Entered By: Rhae Hammock on 08/03/2022 08:16:09 -------------------------------------------------------------------------------- Nutrition Risk Screening Details Patient Name: Date of Service: [name]. 08/03/2022 8:00 A M Medical Record Number: [id number] Patient Account Number: [id number] Date of Birth/Sex: Treating RN: Oct 19, 1956 (66 y.o. [name], [name] Primary Care [name]: [name] Other Clinician: Referring [name]: Treating Kenyatta Gloeckner/Extender: [name] in Treatment: 0 Height (in): Weight (lbs): Body Mass Index (BMI): Nutrition Risk Screening Items Score Screening NUTRITION RISK SCREEN: I have an illness or condition that made me change the kind and/or amount of food I eat 0 No I eat fewer than two meals per day 0 No I eat few fruits and vegetables, or milk products 0 No I have three or more drinks of beer, liquor or wine almost every day 0 No I have tooth or mouth problems that make it hard for me to eat 0 No I don't always have enough money to buy the food I need 0 No I eat alone most of the time 0 No I take three or more different prescribed or over-the-counter drugs a day 0 No Without wanting to, I have lost or gained 10 pounds in the last six months 0 No I am not always physically able to shop, cook and/or feed myself 0 No Nutrition Protocols Good Risk Protocol 0 No interventions needed Moderate Risk Protocol High Risk Proctocol Risk Level: Good Risk Score:  0 Electronic Signature(s) Signed: 08/06/2022 4:05:37 PM By: Rhae Hammock RN Entered By: Rhae Hammock on 08/03/2022 08:15:59

## 2022-08-10 ENCOUNTER — Encounter (HOSPITAL_BASED_OUTPATIENT_CLINIC_OR_DEPARTMENT_OTHER): Payer: Medicare Other | Admitting: Internal Medicine

## 2022-08-10 DIAGNOSIS — E11622 Type 2 diabetes mellitus with other skin ulcer: Secondary | ICD-10-CM

## 2022-08-10 DIAGNOSIS — L98412 Non-pressure chronic ulcer of buttock with fat layer exposed: Secondary | ICD-10-CM | POA: Diagnosis not present

## 2022-08-10 NOTE — Progress Notes (Signed)
[name], [name] ([id number]) [id number] Page 1 of 8 Visit Report for 08/10/2022 Chief Complaint Document Details Patient Name: Date of Service: [name] 08/10/2022 9:15 A M Medical Record Number: [id number] Patient Account Number: [id number] Date of Birth/Sex: Treating RN: 05-29-1956 (66 y.o. F) Primary Care Provider: Cathlean Cower Other Clinician: Referring Provider: Treating Provider/Extender: Hollie Beach in Treatment: 1 Information Obtained from: Patient Chief Complaint 08/03/2022; several year history of waxing and waning to a wound to the intergluteal cleft Electronic Signature(s) Signed: 08/10/2022 11:26:43 AM By: Kalman Shan DO Entered By: Kalman Shan on 08/10/2022 10:26:31 -------------------------------------------------------------------------------- HPI Details Patient Name: Date of Service: Jacqueline Lyons Young. 08/10/2022 9:15 A M Medical Record Number: [id number] Patient Account Number: [id number] Date of Birth/Sex: Treating RN: 1956/07/01 (66 y.o. F) Primary Care Provider: Cathlean Cower Other Clinician: Referring Provider: Treating Provider/Extender: Hollie Beach in Treatment: 1 History of Present Illness HPI Description: 11/02/2019 ADMISSION This is a 66 year old woman who has a wound on the right posterior Achilles area towards the distal end of the Achilles tendon surgery scars. She tells me that roughly 2-1/2 to 3 weeks ago she was picking at some dry skin in the area pulled it off and that is how the wound opened up. She had an original spontaneous Achilles tendon rupture in the early 1990s that was repaired and then very shortly thereafter it reruptured and she required another surgery. She has not had any problems with this since then. The wound itself currently has episodic sharp pain some swelling. She was put on Augmentin by her primary physician  on 10/30/2019 for possible coexistent cellulitis. She used hydrogen peroxide for a while with topical antibiotics. Mostly she has been using topical antibiotics as somebody told her that hydrogen peroxide was not good to use. 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 Surgery was in [place], bilateral total knee replacements. ABIs in our clinic on the right were 1.09 1/21; patient here with a linear wound in the right Achilles area. Her wrap fell down she did not call us to replace it. She has questions about her compression wrap necessity. Finally she asked if the wound could be sutured The patient is under a lot of pressure with initiation of chemotherapy she is going to have a port placed etc. 1/28; linear wound in the right Achilles area in the setting of previous surgery. She is going for a port for chemotherapy for her breast cancer. She has apparently made herself an appointment with orthopedic surgery. I am not sure who she has made an appointment with at this point. 2/12 linear wound in the right Achilles is come in in terms of length. This is in the setting of previous surgery in this area The patient is undergoing chemotherapy for breast cancer and having significant side effects [diarrhea] I am trying not to bring in her in too often as long as her wound is contracted 3/5; linear wound in the right Achilles. Not quite as long however still some measurable depth. She tells [place] that she has had problems with chemotherapy also apparently pseudomembranous colitis. She has not had much attention to paid to the wound. Came in without any  dressing on etc. 4/16; right Achilles. She is completely closed 08/03/2022 Ms.  Tarhonda Hollenberg is a 66 year old female with a past medical history of left sided breast cancer currently on chemotherapy, morbid obesity and [name], [name] ([id number]) [id number] Page 2 of 8 currently controlled type 2 diabetes that presents the clinic for a wound to her intergluteal cleft. She states that this is an area that [place] and wanes in healing. She does report sitting for most of the day. She has been using zinc oxide to the area. She currently denies signs of infection. 10/23; patient presents for follow-up. She has been using AandD ointment to the wound bed. She did not receive her supplies from [facility]. She has no issues or complaints today. Electronic Signature(s) Signed: 08/10/2022 11:26:43 AM By: Kalman Shan DO Entered By: Kalman Shan on 08/10/2022 10:27:11 -------------------------------------------------------------------------------- Physical Exam Details Patient Name: Date of Service: Jacqueline Lyons Young. 08/10/2022 9:15 A M Medical Record Number: [id number] Patient Account Number: [id number] Date of Birth/Sex: Treating RN: 1956/06/14 (66 y.o. F) Primary Care Provider: Cathlean Cower Other Clinician: Referring Provider: Treating Provider/Extender: Hollie Beach in Treatment: 1 Constitutional respirations regular, non-labored and within target range for patient.. Cardiovascular 2+ dorsalis pedis/posterior tibialis pulses. Psychiatric pleasant and cooperative. Notes T the intergluteal cleft there is a elongated wound with granulation tissue throughout. No signs of infection. o Electronic Signature(s) Signed: 08/10/2022 11:26:43 AM By: Kalman Shan DO Entered By: Kalman Shan on 08/10/2022 10:27:46 -------------------------------------------------------------------------------- Physician Orders Details Patient Name: Date of Service: Jacqueline Lyons Young. 08/10/2022 9:15 A M Medical  Record Number: [id number] Patient Account Number: [id number] Date of Birth/Sex: Treating RN: 21-Jan-1956 (65 y.o. [name], [name] Provider: Cathlean Cower Other Clinician: Referring Provider: Treating Provider/Extender: Hollie Beach in Treatment: 1 Verbal / Phone Orders: No Diagnosis Coding Follow-up Appointments Return Appointment in 1 week. Bathing/ Shower/ Hygiene May shower with protection but do not get wound dressing(s) wet. Off-Loading Turn and reposition every 2 hours Wound Treatment Wound #2 [name], [name] ([id number]) [id number] Page 3 of 8 Cleanser: Anasept Antimicrobial Skin and Wound Cleanser, 8 (oz) (DME) (Generic) 2 x Per Day/15 Days Discharge Instructions: Cleanse the wound with Anasept cleanser prior to applying a clean dressing using gauze sponges, not tissue or cotton balls. Cleanser: Anasept Antimicrobial Skin and Wound Cleanser, 8 (oz) (DME) (Generic) 2 x Per Day/15 Days Discharge Instructions: Cleanse the wound with Anasept cleanser prior to applying a clean dressing using gauze sponges, not tissue or cotton balls. Peri-Wound Care: Gentell AandD+E Ointment, Tube 2 (oz) 2 x Per Day/15 Days Prim Dressing: KerraCel Ag Gelling Fiber Dressing, 4x5 in (silver alginate) (DME) (Generic) 2 x Per Day/15 Days ary Discharge Instructions: Apply silver alginate to wound bed as instructed Secondary Dressing: Woven Gauze Sponge, Non-Sterile 4x4 in (DME) (Generic) 2 x Per Day/15 Days Discharge Instructions: Apply over primary dressing as directed. Secondary Dressing: Zetuvit Plus Silicone Border Dressing 7x7(in/in) (DME) (Generic) 2 x Per Day/15 Days Discharge Instructions: Apply silicone border over primary dressing as directed. Electronic Signature(s) Signed: 08/10/2022 11:26:43 AM By: Kalman Shan DO Entered By: Kalman Shan on 08/10/2022  10:27:54 -------------------------------------------------------------------------------- Problem List Details Patient Name: Date of Service: Jacqueline Lyons Young. 08/10/2022 9:15 A M Medical Record Number: [id number] Patient Account Number: [id number] Date of Birth/Sex: Treating RN: 1956-07-26 (66 y.o. F) Primary Care Provider: Cathlean Cower Other Clinician: Referring Provider: Treating Provider/Extender: Hollie Beach in  Treatment: 1 Active Problems ICD-10 Encounter Code Description Active Date MDM Diagnosis E11.622 Type 2 diabetes mellitus with other skin ulcer 08/03/2022 No Yes L98.412 Non-pressure chronic ulcer of buttock with fat layer exposed 08/03/2022 No Yes Inactive Problems Resolved Problems Electronic Signature(s) Signed: 08/10/2022 11:26:43 AM By: Kalman Shan DO Entered By: Kalman Shan on 08/10/2022 10:26:19 -------------------------------------------------------------------------------- Progress Note Details Patient Name: Date of Service: Jacqueline Lyons Young. 08/10/2022 9:15 A M Medical Record Number: [id number] Patient Account Number: [id number] [name], [name] ([id number]) [id number] Page 4 of 8 Date of Birth/Sex: Treating RN: 20-Jul-1956 (66 y.o. F) Primary Care Provider: Cathlean Cower Other Clinician: Referring Provider: Treating Provider/Extender: Hollie Beach in Treatment: 1 Subjective Chief Complaint Information obtained from Patient 08/03/2022; several year history of waxing and waning to a wound to the intergluteal cleft History of Present Illness (HPI) 11/02/2019 ADMISSION This is a 66 year old woman who has a wound on the right posterior Achilles area towards the distal end of the Achilles tendon surgery scars. She tells me that roughly 2-1/2 to 3 weeks ago she was picking at some dry skin in the area pulled it off and that is how the wound opened up. She had an  original spontaneous Achilles tendon rupture in the early 1990s that was repaired and then very shortly thereafter it reruptured and she required another surgery. She has not had any problems with this since then. The wound itself currently has episodic sharp pain some swelling. She was put on Augmentin by her primary physician on 10/30/2019 for possible coexistent cellulitis. She used hydrogen peroxide for a while with topical antibiotics. Mostly she has been using topical antibiotics as somebody told her that hydrogen peroxide was not good to use. 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 Surgery was in [place], bilateral total knee replacements. ABIs in our clinic on the right were 1.09 1/21; patient here with a linear wound in the right Achilles area. Her wrap fell down she did not call us to replace it. She has questions about her compression wrap necessity. Finally she asked if the wound could be sutured The patient is under a lot of pressure with initiation of chemotherapy she is going to have a port placed etc. 1/28; linear wound in the right Achilles area in the setting of previous surgery. She is going for a port for chemotherapy for her breast cancer. She has apparently made herself an appointment with orthopedic surgery. I am not sure who she has made an appointment with at this point. 2/12 linear wound in the right Achilles is come in in terms of length. This is in the setting of previous surgery in this area The patient is undergoing chemotherapy for breast cancer and having significant side effects [diarrhea] I am trying not to bring in her in too often as long as her wound is contracted 3/5; linear wound in  the right Achilles. Not quite as long however still some measurable depth. She tells [place] that she has had problems with chemotherapy also apparently pseudomembranous colitis. She has not had much attention to paid to the wound. Came in without any dressing on etc. 4/16; right Achilles. She is completely closed 08/03/2022 [name] is a  65 year old female with a past medical history of left sided breast cancer currently on chemotherapy, morbid obesity and currently controlled type 2 diabetes that presents the clinic for a wound to her intergluteal cleft. She states that this is an area that [place] and wanes in healing. She does report sitting for most of the day. She has been using zinc oxide to the area. She currently denies signs of infection. 10/23; patient presents for follow-up. She has been using AandD ointment to the wound bed. She did not receive her supplies from [facility]. She has no issues or complaints today. Patient History Information obtained from Patient. Family History Diabetes - Paternal Grandparents,Mother,Father, Heart Disease - Father, Hypertension - Mother,Father, No family history of Cancer, Hereditary Spherocytosis, Kidney Disease, Lung Disease, Seizures, Stroke, Thyroid Problems, Tuberculosis. 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 Medical History Hematologic/Lymphatic Patient has history of Anemia, Lymphedema Respiratory Patient has history of Sleep Apnea - no CPAP Cardiovascular Patient has history of Hypertension Endocrine Patient has history of Type II Diabetes Genitourinary Denies history of End Stage Renal Disease Integumentary (Skin) Denies history of History of Burn Musculoskeletal Patient has history of Osteoarthritis Oncologic Patient has history of Received Radiation Denies history of Received Chemotherapy Psychiatric Denies history of  Anorexia/bulimia, Confinement Anxiety Hospitalization/Surgery History - bilk knee replacements. - hysterectomy. - left breast lumpectomy. - left achilles repair. - right achilles repair. - c-section. - neck surgery-pinched spinal cord. Medical A Surgical History Notes nd Constitutional Symptoms (General Health) morbid obesity Genitourinary urinary incontinence [name], [name] ([id number]) [id number] Page 5 of 8 Oncologic hx left breast CA with recurring metastatic disease, hx uterine CA Objective Constitutional respirations regular, non-labored and within target range for patient.. Vitals Time Taken: 9:35 AM, Temperature: 98.3 F, Pulse: 67 bpm, Respiratory Rate: 20 breaths/min, Blood Pressure: 134/81 mmHg. Cardiovascular 2+ dorsalis pedis/posterior tibialis pulses. Psychiatric pleasant and cooperative. General Notes: T the intergluteal cleft there is a elongated wound with granulation tissue throughout. No signs of infection. o Integumentary (Hair, Skin) Wound #2 status is Open. Original cause of wound was Gradually Appeared. The date acquired was: 10/19/2005. The wound has been in treatment 1 weeks. The wound is located on the Sacrum. The wound measures 5cm length x 0.2cm width x 0.2cm depth; 0.785cm^2 area and 0.157cm^3 volume. There is Fat Layer (Subcutaneous Tissue) exposed. There is no tunneling or undermining noted. There is a medium amount of serosanguineous drainage noted. The wound margin is distinct with the outline attached to the wound base. There is large (67-100%) red, pink granulation within the wound bed. There is a small (1-33%) amount of necrotic tissue within the wound bed. The periwound skin appearance did not exhibit: Callus, Crepitus, Excoriation, Induration, Rash, Scarring, Dry/Scaly, Maceration, Atrophie Blanche, Cyanosis, Ecchymosis, Hemosiderin Staining, Mottled, Pallor, Rubor, Erythema. Periwound temperature was noted as  No Abnormality. The periwound has tenderness on palpation. Assessment Active Problems ICD-10 Type 2 diabetes mellitus with other skin ulcer Non-pressure chronic ulcer of buttock with fat layer exposed Patient presents for follow-up. Her wound has shown improvement in size and appearance since last clinic visit. Unfortunately she did not receive her supplies and we will follow-up with [facility] for this issue. At this time I recommended keeping the area clean and using silver alginate to the the wound bed directly and AandD ointment to the periwound. Continue aggressive offloading. Follow-up in 1 week. Plan Follow-up Appointments: Return Appointment  in 1 week. Bathing/ Shower/ Hygiene: May shower with protection but do not get wound dressing(s) wet. Off-Loading: Turn and reposition every 2 hours WOUND #2: - Sacrum Wound Laterality: Cleanser: Anasept Antimicrobial Skin and Wound Cleanser, 8 (oz) (DME) (Generic) 2 x Per Day/15 Days Discharge Instructions: Cleanse the wound with Anasept cleanser prior to applying a clean dressing using gauze sponges, not tissue or cotton balls. Cleanser: Anasept Antimicrobial Skin and Wound Cleanser, 8 (oz) (DME) (Generic) 2 x Per Day/15 Days Discharge Instructions: Cleanse the wound with Anasept cleanser prior to applying a clean dressing using gauze sponges, not tissue or cotton balls. Peri-Wound Care: Gentell AandD+E Ointment, Tube 2 (oz) 2 x Per Day/15 Days Prim Dressing: KerraCel Ag Gelling Fiber Dressing, 4x5 in (silver alginate) (DME) (Generic) 2 x Per Day/15 Days ary Discharge Instructions: Apply silver alginate to wound bed as instructed Secondary Dressing: Woven Gauze Sponge, Non-Sterile 4x4 in (DME) (Generic) 2 x Per Day/15 Days Discharge Instructions: Apply over primary dressing as directed. Secondary Dressing: Zetuvit Plus Silicone Border Dressing 7x7(in/in) (DME) (Generic) 2 x Per Day/15 Days Discharge Instructions: Apply silicone border over  primary dressing as directed. 1. Silver alginate 2. AandD ointment 3. Aggressive offloading [name], [name] ([id number]) [id number] Page 6 of 8 4. Anasept wound cleanser 5. Follow-up in 1 week Electronic Signature(s) Signed: 08/10/2022 11:26:43 AM By: Kalman Shan DO Entered By: Kalman Shan on 08/10/2022 10:29:21 -------------------------------------------------------------------------------- HxROS Details Patient Name: Date of Service: Jacqueline Lyons Young. 08/10/2022 9:15 A M Medical Record Number: [id number] Patient Account Number: [id number] Date of Birth/Sex: Treating RN: 1956-04-08 (66 y.o. F) Primary Care Provider: Cathlean Cower Other Clinician: Referring Provider: Treating Provider/Extender: Hollie Beach in Treatment: 1 Information Obtained From Patient Constitutional Symptoms (General Health) Medical History: Past Medical History Notes: morbid obesity Hematologic/Lymphatic Medical History: Positive for: Anemia; Lymphedema Respiratory Medical History: Positive for: Sleep Apnea - no CPAP Cardiovascular Medical History: Positive for: Hypertension Endocrine Medical History: Positive for: Type II Diabetes Time with diabetes: 5 yrs Treated with: Oral agents Blood sugar tested every day: No Genitourinary Medical History: Negative for: End Stage Renal Disease Past Medical History Notes: urinary incontinence Integumentary (Skin) Medical History: Negative for: History of Burn Musculoskeletal Medical History: Positive for: Osteoarthritis Oncologic Medical HistoryCANDIA, [name] ([id number]) [id number] Page 7 of 8 Positive for: Received Radiation Negative for: Received Chemotherapy Past Medical History Notes: hx left breast CA with recurring metastatic disease, hx uterine CA Psychiatric Medical History: Negative for: Anorexia/bulimia; Confinement  Anxiety Immunizations Pneumococcal Vaccine: Received Pneumococcal Vaccination: Yes Received Pneumococcal Vaccination On or After 60th Birthday: Yes Implantable Devices Yes Hospitalization / Surgery History Type of Hospitalization/Surgery bilk knee replacements hysterectomy left breast lumpectomy left achilles repair right achilles repair c-section neck surgery-pinched spinal cord Family and Social History Cancer: No; Diabetes: Yes - Paternal Grandparents,Mother,Father; Heart Disease: Yes - Father; Hereditary Spherocytosis: No; Hypertension: Yes - Mother,Father; Kidney Disease: No; Lung Disease: No; Seizures: No; Stroke: No; Thyroid Problems: No; Tuberculosis: No; Never smoker; Marital Status - Divorced; Alcohol Use: Rarely; Drug Use: Prior History - TCH; Caffeine Use: Daily - tea; Financial Concerns: No; Food, Clothing or Shelter Needs: No; Support System Lacking: No; Transportation Concerns: Yes - will have multiple appointments for breast CA Electronic Signature(s) Signed: 08/10/2022 11:26:43 AM By: Kalman Shan DO Entered By: Kalman Shan on 08/10/2022 10:27:18 -------------------------------------------------------------------------------- SuperBill Details Patient Name: Date of Service: [name] 08/10/2022 Medical Record Number: [id number] Patient Account Number: [id number] Date of Birth/Sex:  Treating RN: [birth year] (66 y.o. [name], [name] Provider: Cathlean Cower Other Clinician: Referring Provider: Treating Provider/Extender: Hollie Beach in Treatment: 1 Diagnosis Coding ICD-10 Codes Code Description [phone] Type 2 diabetes mellitus with other skin ulcer L98.412 Non-pressure chronic ulcer of buttock with fat layer exposed Facility Procedures : CPT4 Code: [id number] Description: 99213 - WOUND CARE VISIT-LEV 3 EST PT Modifier: Quantity: 1 Physician Procedures : CPT4 Code Description Modifier [id number]  - WC PHYS LEVEL 3 - EST PT ICD-10 Diagnosis Description E11.622 Type 2 diabetes mellitus with other skin ulcer [name], [name] ([id number]) [id number] L98.412 Non-pressure chronic  ulcer of buttock with fat layer exposed Quantity: 1 1227.pdf Page 8 of 8 Electronic Signature(s) Signed: 08/10/2022 11:26:43 AM By: Kalman Shan DO Entered By: Kalman Shan on 08/10/2022 10:29:52

## 2022-08-12 ENCOUNTER — Telehealth: Payer: Self-pay

## 2022-08-12 ENCOUNTER — Other Ambulatory Visit: Payer: Self-pay

## 2022-08-12 DIAGNOSIS — K529 Noninfective gastroenteritis and colitis, unspecified: Secondary | ICD-10-CM

## 2022-08-12 DIAGNOSIS — D509 Iron deficiency anemia, unspecified: Secondary | ICD-10-CM

## 2022-08-12 NOTE — Telephone Encounter (Signed)
Patient with diagnosis of DVT on Xarelto for anticoagulation.    Procedure: EGD/COLONOSCOPY Date of procedure: 09/21/22   CrCl 57 mL/min using adj body weight Platelet count 212K   Per office protocol, patient can hold Xarelto for 1 day prior to procedure.    **This guidance is not considered finalized until pre-operative APP has relayed final recommendations.**

## 2022-08-12 NOTE — Telephone Encounter (Signed)
On Xarelto due to history of DVT (05/2020) in setting of malignancy.   She has hx of chemotherapy related cardiomypathy, HTN, HLD, obesity. Prior CTA 06/2020 with no CAD. No known hx of PE or CVA.  Will route to pharmacy team for input.   Loel Dubonnet, NP

## 2022-08-12 NOTE — Telephone Encounter (Signed)
   Patient Name: [name]  DOB: September 25, 1956 MRN: [id number]  Primary Cardiologist: Loralie Champagne, MD  Chart reviewed as part of pre-operative protocol coverage.Pharmacy clearance only. Per office protocols and pharmacist review, SAARAH DEWING may hold Xarelto one day prior to planned procedure.   If proceduralist would like to hold longer, will have to go to cardiologist for review and updated clearance will need to be requested.   I will route this recommendation to the requesting party via Epic fax function and remove from pre-op pool.  Please call with questions.  Loel Dubonnet, NP 08/12/2022, 4:46 PM

## 2022-08-12 NOTE — Progress Notes (Signed)
Scheduled patient for ECL on 12-4 with Dr. Havery Moros at 10:00am (Case [id number]). Patient has PV on Tues 11-21 at 10:00am.

## 2022-08-12 NOTE — Telephone Encounter (Signed)
Called and spoke to patient. She can do her ECL on Dec 4th.  She would like to use SUTAB. She is on Xarelto.  Scheduled her for PV on Nov. 21st, Tuesday. Request to hold Xarelto for 2 days sent to [facility]

## 2022-08-12 NOTE — Telephone Encounter (Signed)
[facility] Pre-operative Risk Assessment     Request for surgical clearance:     Endoscopy Procedure  What type of surgery is being performed?     EGD/COLONOSCOPY  When is this surgery scheduled?     09-21-2022  What type of clearance is required ?   Pharmacy  Are there any medications that need to be held prior to surgery and how long? Page 2 DAYS  Practice name and name of physician performing surgery? [facility]  What is your office phone and fax number?      Phone- [phone]  Fax- [place], CMA  Anesthesia type (None, local, MAC, general) ?       MAC   THANK YOU

## 2022-08-13 ENCOUNTER — Other Ambulatory Visit: Payer: Self-pay

## 2022-08-13 ENCOUNTER — Inpatient Hospital Stay: Payer: Medicare Other

## 2022-08-13 VITALS — BP 137/75 | HR 60 | Temp 98.5°F | Resp 16 | Wt 242.5 lb

## 2022-08-13 DIAGNOSIS — Z95828 Presence of other vascular implants and grafts: Secondary | ICD-10-CM

## 2022-08-13 DIAGNOSIS — Z5112 Encounter for antineoplastic immunotherapy: Secondary | ICD-10-CM | POA: Diagnosis not present

## 2022-08-13 DIAGNOSIS — Z171 Estrogen receptor negative status [ER-]: Secondary | ICD-10-CM

## 2022-08-13 LAB — CBC WITH DIFFERENTIAL (CANCER CENTER ONLY)
Abs Immature Granulocytes: 0.01 10*3/uL (ref 0.00–0.07)
Basophils Absolute: 0 10*3/uL (ref 0.0–0.1)
Basophils Relative: 1 %
Eosinophils Absolute: 0.1 10*3/uL (ref 0.0–0.5)
Eosinophils Relative: 3 %
HCT: 31.7 % — ABNORMAL LOW (ref 36.0–46.0)
Hemoglobin: 10.2 g/dL — ABNORMAL LOW (ref 12.0–15.0)
Immature Granulocytes: 0 %
Lymphocytes Relative: 28 %
Lymphs Abs: 1.5 10*3/uL (ref 0.7–4.0)
MCH: 29 pg (ref 26.0–34.0)
MCHC: 32.2 g/dL (ref 30.0–36.0)
MCV: 90.1 fL (ref 80.0–100.0)
Monocytes Absolute: 0.6 10*3/uL (ref 0.1–1.0)
Monocytes Relative: 12 %
Neutro Abs: 2.9 10*3/uL (ref 1.7–7.7)
Neutrophils Relative %: 56 %
Platelet Count: 206 10*3/uL (ref 150–400)
RBC: 3.52 MIL/uL — ABNORMAL LOW (ref 3.87–5.11)
RDW: 14.7 % (ref 11.5–15.5)
WBC Count: 5.2 10*3/uL (ref 4.0–10.5)
nRBC: 0 % (ref 0.0–0.2)

## 2022-08-13 LAB — CMP (CANCER CENTER ONLY)
ALT: 15 U/L (ref 0–44)
AST: 19 U/L (ref 15–41)
Albumin: 3.8 g/dL (ref 3.5–5.0)
Alkaline Phosphatase: 48 U/L (ref 38–126)
Anion gap: 6 (ref 5–15)
BUN: 32 mg/dL — ABNORMAL HIGH (ref 8–23)
CO2: 29 mmol/L (ref 22–32)
Calcium: 9.4 mg/dL (ref 8.9–10.3)
Chloride: 105 mmol/L (ref 98–111)
Creatinine: 1.22 mg/dL — ABNORMAL HIGH (ref 0.44–1.00)
GFR, Estimated: 50 mL/min — ABNORMAL LOW (ref >60)
Glucose, Bld: 143 mg/dL — ABNORMAL HIGH (ref 70–99)
Potassium: 3.8 mmol/L (ref 3.5–5.1)
Sodium: 140 mmol/L (ref 135–145)
Total Bilirubin: 0.4 mg/dL (ref 0.3–1.2)
Total Protein: 7.9 g/dL (ref 6.5–8.1)

## 2022-08-13 MED ORDER — DIPHENHYDRAMINE HCL 25 MG PO CAPS
25.0000 mg | ORAL_CAPSULE | Freq: Once | ORAL | Status: AC
  Administered 2022-08-13: 25 mg via ORAL
  Filled 2022-08-13: qty 1

## 2022-08-13 MED ORDER — HEPARIN SOD (PORK) LOCK FLUSH 100 UNIT/ML IV SOLN
500.0000 [IU] | Freq: Once | INTRAVENOUS | Status: AC | PRN
  Administered 2022-08-13: 500 [IU]

## 2022-08-13 MED ORDER — SODIUM CHLORIDE 0.9 % IV SOLN: Freq: Once | INTRAVENOUS | Status: AC

## 2022-08-13 MED ORDER — TRASTUZUMAB-ANNS CHEMO 150 MG IV SOLR
6.0000 mg/kg | Freq: Once | INTRAVENOUS | Status: AC
  Administered 2022-08-13: 630 mg via INTRAVENOUS
  Filled 2022-08-13: qty 30

## 2022-08-13 MED ORDER — SODIUM CHLORIDE 0.9% FLUSH
10.0000 mL | INTRAVENOUS | Status: DC | PRN
  Administered 2022-08-13: 10 mL

## 2022-08-13 MED ORDER — ACETAMINOPHEN 325 MG PO TABS
650.0000 mg | ORAL_TABLET | Freq: Once | ORAL | Status: AC
  Administered 2022-08-13: 650 mg via ORAL
  Filled 2022-08-13: qty 2

## 2022-08-13 MED ORDER — SODIUM CHLORIDE 0.9% FLUSH
10.0000 mL | Freq: Once | INTRAVENOUS | Status: AC
  Administered 2022-08-13: 10 mL

## 2022-08-13 NOTE — Progress Notes (Signed)
SENOVIA, GAUER ([id number]) [id number] Page 1 of 9 Visit Report for 08/10/2022 Arrival Information Details Patient Name: Date of Service: [place] [name] 08/10/2022 9:15 A M Medical Record Number: [id number] Patient Account Number: [id number] Date of Birth/Sex: Treating RN: 03/15/1956 (66 y.o. Helene Shoe, Meta.Reding Primary Care Amiya Escamilla: Cathlean Cower Other Clinician: Referring Danica Camarena: Treating Camiyah Friberg/Extender: Hollie Beach in Treatment: 1 Visit Information History Since Last Visit Added or deleted any medications: No Patient Arrived: [name] Any new allergies or adverse reactions: No Arrival Time: 09:25 Had a fall or experienced change in No Accompanied By: self activities of daily living that may affect Transfer Assistance: None risk of falls: Patient Identification Verified: Yes Signs or symptoms of abuse/neglect since last visito No Secondary Verification Process Completed: Yes Hospitalized since last visit: No Patient Requires Transmission-Based Precautions: No Implantable device outside of the clinic excluding No Patient Has Alerts: No cellular tissue based products placed in the center since last visit: Has Dressing in Place as Prescribed: No Pain Present Now: No Electronic Signature(s) Signed: 08/10/2022 5:18:37 PM By: Deon Pilling RN, BSN Entered By: Deon Pilling on 08/10/2022 09:49:13 -------------------------------------------------------------------------------- Clinic Level of Care Assessment Details Patient Name: Date of Service: [facility] [name] 08/10/2022 9:15 A M Medical Record Number: [id number] Patient Account Number: [id number] Date of Birth/Sex: Treating RN: [birth year] (66 y.o. [name], [name] Natayla Cadenhead: Cathlean Cower Other Clinician: Referring Solange Emry: Treating Nyjah Denio/Extender: Hollie Beach in Treatment: 1 Clinic Level of Care Assessment  Items TOOL 4 Quantity Score X- 1 0 Use when only an EandM is performed on FOLLOW-UP visit ASSESSMENTS - Nursing Assessment / Reassessment X- 1 10 Reassessment of Co-morbidities (includes updates in patient status) X- 1 5 Reassessment of Adherence to Treatment Plan ASSESSMENTS - Wound and Skin A ssessment / Reassessment X - Simple Wound Assessment / Reassessment - one wound 1 5 '[]'$  - 0 Complex Wound Assessment / Reassessment - multiple wounds '[]'$  - 0 Dermatologic / Skin Assessment (not related to wound area) ASSESSMENTS - Focused Assessment '[]'$  - 0 Circumferential Edema Measurements - multi extremities '[]'$  - 0 Nutritional Assessment / Counseling / Intervention AASHKA, SALOMONE V ([id number]) [phone] Page 2 of 9 '[]'$  - 0 Lower Extremity Assessment (monofilament, tuning fork, pulses) '[]'$  - 0 Peripheral Arterial Disease Assessment (using hand held doppler) ASSESSMENTS - Ostomy and/or Continence Assessment and Care '[]'$  - 0 Incontinence Assessment and Management '[]'$  - 0 Ostomy Care Assessment and Management (repouching, etc.) PROCESS - Coordination of Care X - Simple Patient / Family Education for ongoing care 1 15 '[]'$  - 0 Complex (extensive) Patient / Family Education for ongoing care X- 1 10 Staff obtains Programmer, systems, Records, T Results / Process Orders est '[]'$  - 0 Staff telephones HHA, Nursing Homes / Clarify orders / etc '[]'$  - 0 Routine Transfer to another Facility (non-emergent condition) '[]'$  - 0 Routine Hospital Admission (non-emergent condition) '[]'$  - 0 New Admissions / Biomedical engineer / Ordering NPWT Apligraf, etc. , '[]'$  - 0 Emergency Hospital Admission (emergent condition) X- 1 10 Simple Discharge Coordination '[]'$  - 0 Complex (extensive) Discharge Coordination PROCESS - Special Needs '[]'$  - 0 Pediatric / Minor Patient Management '[]'$  - 0 Isolation Patient Management '[]'$  - 0 Hearing / Language / Visual special needs '[]'$  - 0 Assessment of  Community assistance (transportation, D/C planning, etc.) '[]'$  - 0 Additional assistance / Altered mentation '[]'$  - 0 Support Surface(s) Assessment (bed, cushion, seat, etc.) INTERVENTIONS - Wound Cleansing /  Measurement X - Simple Wound Cleansing - one wound 1 5 '[]'$  - 0 Complex Wound Cleansing - multiple wounds X- 1 5 Wound Imaging (photographs - any number of wounds) '[]'$  - 0 Wound Tracing (instead of photographs) X- 1 5 Simple Wound Measurement - one wound '[]'$  - 0 Complex Wound Measurement - multiple wounds INTERVENTIONS - Wound Dressings X - Small Wound Dressing one or multiple wounds 1 10 '[]'$  - 0 Medium Wound Dressing one or multiple wounds '[]'$  - 0 Large Wound Dressing one or multiple wounds X- 1 5 Application of Medications - topical '[]'$  - 0 Application of Medications - injection INTERVENTIONS - Miscellaneous '[]'$  - 0 External ear exam '[]'$  - 0 Specimen Collection (cultures, biopsies, blood, body fluids, etc.) '[]'$  - 0 Specimen(s) / Culture(s) sent or taken to Lab for analysis '[]'$  - 0 Patient Transfer (multiple staff / Civil Service fast streamer / Similar devices) '[]'$  - 0 Simple Staple / Suture removal (25 or less) '[]'$  - 0 Complex Staple / Suture removal (26 or more) '[]'$  - 0 Hypo / Hyperglycemic Management (close monitor of Blood Glucose) GENEVIEVE, ARBAUGH V ([id number]) [id number] Page 3 of 9 '[]'$  - 0 Ankle / Brachial Index (ABI) - do not check if billed separately X- 1 5 Vital Signs Has the patient been seen at the hospital within the last three years: Yes Total Score: 90 Level Of Care: New/Established - Level 3 Electronic Signature(s) Signed: 08/13/2022 4:44:37 PM By: Rhae Hammock RN Entered By: Rhae Hammock on 08/10/2022 10:23:58 -------------------------------------------------------------------------------- Encounter Discharge Information Details Patient Name: Date of Service: [name] 08/10/2022 9:15 A M Medical Record Number:  [id number] Patient Account Number: [id number] Date of Birth/Sex: Treating RN: 1956/05/15 (67 y.o. [name], [name] Rylah Fukuda: Cathlean Cower Other Clinician: Referring Romin Divita: Treating Karis Emig/Extender: Hollie Beach in Treatment: 1 Encounter Discharge Information Items Discharge Condition: Stable Ambulatory Status: Ambulatory Discharge Destination: Home Transportation: Private Auto Accompanied By: self Schedule Follow-up Appointment: Yes Clinical Summary of Care: Patient Declined Electronic Signature(s) Signed: 08/13/2022 4:44:37 PM By: Rhae Hammock RN Entered By: Rhae Hammock on 08/10/2022 10:25:08 -------------------------------------------------------------------------------- Lower Extremity Assessment Details Patient Name: Date of Service: [name] 08/10/2022 9:15 A M Medical Record Number: [id number] Patient Account Number: [id number] Date of Birth/Sex: Treating RN: [birth year] (66 y.o. Debby Bud Primary Care Gitel Beste: Cathlean Cower Other Clinician: Referring Ramesses Crampton: Treating Deshanae Lindo/Extender: Hollie Beach in Treatment: 1 Electronic Signature(s) Signed: 08/10/2022 5:18:37 PM By: Deon Pilling RN, BSN Entered By: Deon Pilling on 08/10/2022 09:49:51 -------------------------------------------------------------------------------- Multi Wound Chart Details Patient Name: Date of Service: [name] 08/10/2022 9:15 A M Medical Record Number: [id number] Patient Account Number: [id number] POLLYANNA, LEVAY ([id number]) [phone].pdf Page 4 of 9 Date of Birth/Sex: Treating RN: [birth year] (66 y.o. F) Primary Care Kinnley Paulson: Cathlean Cower Other Clinician: Referring Jarious Lyon: Treating Tejay Hubert/Extender: Hollie Beach in Treatment: 1 Vital Signs Height(in): Pulse(bpm): 47 Weight(lbs): Blood Pressure(mmHg): 134/81 Body Mass  Index(BMI): Temperature(F): 98.3 Respiratory Rate(breaths/min): 20 [2:Photos:] [N/A:N/A] Sacrum N/A N/A Wound Location: Gradually Appeared N/A N/A Wounding Event: MASD N/A N/A Primary Etiology: Anemia, Lymphedema, Sleep Apnea, N/A N/A Comorbid History: Hypertension, Type II Diabetes, Osteoarthritis, Received Radiation 10/19/2005 N/A N/A Date Acquired: 1 N/A N/A Weeks of Treatment: Open N/A N/A Wound Status: No N/A N/A Wound Recurrence: 5x0.2x0.2 N/A N/A Measurements L x W x D (cm) 0.785 N/A N/A A (cm) : rea 0.157 N/A N/A Volume (cm) : 72.20%  N/A N/A % Reduction in Area: 72.20% N/A N/A % Reduction in Volume: Full Thickness With Exposed Support N/A N/A Classification: Structures Medium N/A N/A Exudate Amount: Serosanguineous N/A N/A Exudate Type: red, brown N/A N/A Exudate Color: Distinct, outline attached N/A N/A Wound Margin: Large (67-100%) N/A N/A Granulation Amount: Red, Pink N/A N/A Granulation Quality: Small (1-33%) N/A N/A Necrotic Amount: Fat Layer (Subcutaneous Tissue): Yes N/A N/A Exposed Structures: Fascia: No Tendon: No Muscle: No Joint: No Bone: No Medium (34-66%) N/A N/A Epithelialization: Excoriation: No N/A N/A Periwound Skin Texture: Induration: No Callus: No Crepitus: No Rash: No Scarring: No Maceration: No N/A N/A Periwound Skin Moisture: Dry/Scaly: No Atrophie Blanche: No N/A N/A Periwound Skin Color: Cyanosis: No Ecchymosis: No Erythema: No Hemosiderin Staining: No Mottled: No Pallor: No Rubor: No No Abnormality N/A N/A Temperature: Yes N/A N/A Tenderness on Palpation: Treatment Notes Wound #2 (Sacrum) Cleanser Anasept Antimicrobial Skin and Wound Cleanser, 8 (oz) Discharge Instruction: Cleanse the wound with Anasept cleanser prior to applying a clean dressing using gauze sponges, not tissue or cotton balls. DELMI, FULFER ([id number]) [id number] Page 5 of 9 Anasept  Antimicrobial Skin and Wound Cleanser, 8 (oz) Discharge Instruction: Cleanse the wound with Anasept cleanser prior to applying a clean dressing using gauze sponges, not tissue or cotton balls. Peri-Wound Care Gentell AandD+E Ointment, Tube 2 (oz) Topical Primary Dressing KerraCel Ag Gelling Fiber Dressing, 4x5 in (silver alginate) Discharge Instruction: Apply silver alginate to wound bed as instructed Secondary Dressing Woven Gauze Sponge, Non-Sterile 4x4 in Discharge Instruction: Apply over primary dressing as directed. Zetuvit Plus Silicone Border Dressing 7x7(in/in) Discharge Instruction: Apply silicone border over primary dressing as directed. Secured With Compression Wrap Compression Stockings Add-Ons Electronic Signature(s) Signed: 08/10/2022 11:26:43 AM By: Kalman Shan DO Entered By: Kalman Shan on 08/10/2022 10:26:22 -------------------------------------------------------------------------------- Multi-Disciplinary Care Plan Details Patient Name: Date of Service: [name] 08/10/2022 9:15 A M Medical Record Number: [id number] Patient Account Number: [id number] Date of Birth/Sex: Treating RN: 1956/04/27 (66 y.o. [name], [name] Little Bashore: Cathlean Cower Other Clinician: Referring Tami Barren: Treating Ambry Dix/Extender: Hollie Beach in Treatment: 1 Active Inactive Orientation to the Wound Care Program Nursing Diagnoses: Knowledge deficit related to the wound healing center program Goals: Patient/caregiver will verbalize understanding of the [place] Date Initiated: 08/03/2022 Target Resolution Date: 08/22/2022 Goal Status: Active Interventions: Provide education on orientation to the wound center Notes: Wound/Skin Impairment Nursing Diagnoses: Impaired tissue integrity Knowledge deficit related to ulceration/compromised skin integrity Goals: Patient will have a decrease in wound volume  by X% from date: (specify in notes) SHATONA, ANDUJAR V ([id number]) [phone].pdf Page 6 of 9 Date Initiated: 08/03/2022 Target Resolution Date: 08/22/2022 Goal Status: Active Patient/caregiver will verbalize understanding of skin care regimen Date Initiated: 08/03/2022 Target Resolution Date: 08/22/2022 Goal Status: Active Ulcer/skin breakdown will have a volume reduction of 30% by week 4 Date Initiated: 08/03/2022 Target Resolution Date: 08/22/2022 Goal Status: Active Interventions: Assess patient/caregiver ability to obtain necessary supplies Assess patient/caregiver ability to perform ulcer/skin care regimen upon admission and as needed Assess ulceration(s) every visit Notes: Electronic Signature(s) Signed: 08/13/2022 4:44:37 PM By: Rhae Hammock RN Entered By: Rhae Hammock on 08/10/2022 10:17:47 -------------------------------------------------------------------------------- Pain Assessment Details Patient Name: Date of Service: [name] 08/10/2022 9:15 A M Medical Record Number: [id number] Patient Account Number: [id number] Date of Birth/Sex: Treating RN: [birth year] (66 y.o. Helene Shoe, Tammi Klippel Primary Care Tennelle Taflinger: Cathlean Cower Other Clinician: Referring Angellee Cohill: Treating Rafaelita Foister/Extender: Heber Harvey  [place] Sink, Casper Harrison in Treatment: 1 Active Problems Location of Pain Severity and Description of Pain Patient Has Paino No Site Locations Rate the pain. Current Pain Level: 0 Pain Management and Medication Current Pain Management: Medication: No Cold Application: No Rest: No Massage: No Activity: No T.E.N.S.: No Heat Application: No Leg drop or elevation: No Is the Current Pain Management Adequate: Adequate How does your wound impact your activities of daily livingo Sleep: No Bathing: No Appetite: No Relationship With Others: No Bladder Continence: No Emotions: No Bowel Continence: No Work: No Toileting:  No Drive: No Dressing: No Hobbies: No DISA, RIEDLINGER V ([id number]) [id number].pdf Page 7 of 9 Notes per patient at times pain. currently 0/10. Electronic Signature(s) Signed: 08/10/2022 5:18:37 PM By: Deon Pilling RN, BSN Entered By: Deon Pilling on 08/10/2022 09:49:46 -------------------------------------------------------------------------------- Patient/Caregiver Education Details Patient Name: Date of Service: Gonzella Lex 10/23/2023andnbsp9:15 A M Medical Record Number: [id number] Patient Account Number: [id number] Date of Birth/Gender: Treating RN: August 08, 1956 (66 y.o. [name], [name] Physician: Cathlean Cower Other Clinician: Referring Physician: Treating Physician/Extender: Hollie Beach in Treatment: 1 Education Assessment Education Provided To: Patient Education Topics Provided [place]: o Methods: Explain/Verbal Responses: Reinforcements needed, State content correctly Electronic Signature(s) Signed: 08/13/2022 4:44:37 PM By: Rhae Hammock RN Entered By: Rhae Hammock on 08/10/2022 10:23:32 -------------------------------------------------------------------------------- Wound Assessment Details Patient Name: Date of Service: [name] 08/10/2022 9:15 A M Medical Record Number: [id number] Patient Account Number: [id number] Date of Birth/Sex: Treating RN: 10-Nov-1955 (66 y.o. Helene Shoe, Meta.Reding Primary Care Lynne Takemoto: Cathlean Cower Other Clinician: Referring Khaleesi Gruel: Treating Kavita Bartl/Extender: Hollie Beach in Treatment: 1 Wound Status Wound Number: 2 Primary MASD Etiology: Wound Location: Sacrum Wound Open Wounding Event: Gradually Appeared Status: Date Acquired: 10/19/2005 Comorbid Anemia, Lymphedema, Sleep Apnea, Hypertension, Type II Weeks Of Treatment: 1 History: Diabetes, Osteoarthritis, Received  Radiation Clustered Wound: No Photos KAMBRI, DISMORE V ([id number]) [phone].pdf Page 8 of 9 Wound Measurements Length: (cm) 5 Width: (cm) 0.2 Depth: (cm) 0.2 Area: (cm) 0.785 Volume: (cm) 0.157 % Reduction in Area: 72.2% % Reduction in Volume: 72.2% Epithelialization: Medium (34-66%) Tunneling: No Undermining: No Wound Description Classification: Full Thickness With Exposed Support Structures Wound Margin: Distinct, outline attached Exudate Amount: Medium Exudate Type: Serosanguineous Exudate Color: red, brown Foul Odor After Cleansing: No Slough/Fibrino Yes Wound Bed Granulation Amount: Large (67-100%) Exposed Structure Granulation Quality: Red, Pink Fascia Exposed: No Necrotic Amount: Small (1-33%) Fat Layer (Subcutaneous Tissue) Exposed: Yes Tendon Exposed: No Muscle Exposed: No Joint Exposed: No Bone Exposed: No Periwound Skin Texture Texture Color No Abnormalities Noted: No No Abnormalities Noted: No Callus: No Atrophie Blanche: No Crepitus: No Cyanosis: No Excoriation: No Ecchymosis: No Induration: No Erythema: No Rash: No Hemosiderin Staining: No Scarring: No Mottled: No Pallor: No Moisture Rubor: No No Abnormalities Noted: No Dry / Scaly: No Temperature / Pain Maceration: No Temperature: No Abnormality Tenderness on Palpation: Yes Treatment Notes Wound #2 (Sacrum) Cleanser Anasept Antimicrobial Skin and Wound Cleanser, 8 (oz) Discharge Instruction: Cleanse the wound with Anasept cleanser prior to applying a clean dressing using gauze sponges, not tissue or cotton balls. Anasept Antimicrobial Skin and Wound Cleanser, 8 (oz) Discharge Instruction: Cleanse the wound with Anasept cleanser prior to applying a clean dressing using gauze sponges, not tissue or cotton balls. Peri-Wound Care Gentell AandD+E Ointment, Tube 2 (oz) Topical Primary Dressing KerraCel Ag Gelling Fiber Dressing, 4x5 in (silver  alginate) Discharge Instruction:  Apply silver alginate to wound bed as instructed Secondary Dressing Woven Gauze Sponge, Non-Sterile 4x4 in Discharge Instruction: Apply over primary dressing as directed. CHIVON, LEPAGE ([id number]) [id number] Page 9 of 9 Zetuvit Plus Silicone Border Dressing 7x7(in/in) Discharge Instruction: Apply silicone border over primary dressing as directed. Secured With Compression Wrap Compression Stockings Environmental education officer) Signed: 08/10/2022 5:18:37 PM By: Deon Pilling RN, BSN Entered By: Deon Pilling on 08/10/2022 09:51:54 -------------------------------------------------------------------------------- Vitals Details Patient Name: Date of Service: [name] 08/10/2022 9:15 A M Medical Record Number: [id number] Patient Account Number: [id number] Date of Birth/Sex: Treating RN: 29-Oct-1955 (66 y.o. Debby Bud Primary Care Nereyda Bowler: Cathlean Cower Other Clinician: Referring Kaheem Halleck: Treating Nizhoni Parlow/Extender: Hollie Beach in Treatment: 1 Vital Signs Time Taken: 09:35 Temperature (F): 98.3 Pulse (bpm): 67 Respiratory Rate (breaths/min): 20 Blood Pressure (mmHg): 134/81 Reference Range: 80 - 120 mg / dl Electronic Signature(s) Signed: 08/10/2022 5:18:37 PM By: Deon Pilling RN, BSN Entered By: Deon Pilling on 08/10/2022 09:49:30

## 2022-08-13 NOTE — Telephone Encounter (Signed)
Noted  

## 2022-08-13 NOTE — Patient Instructions (Signed)
[facility]   Discharge Instructions: Thank you for choosing [facility] to provide your oncology and hematology care.   If you have a lab appointment with the [facility], please go directly to the [facility] and check in at the registration area.   Wear comfortable clothing and clothing appropriate for easy access to any Portacath or PICC line.   We strive to give you quality time with your provider. You may need to reschedule your appointment if you arrive late (15 or more minutes).  Arriving late affects you and other patients whose appointments are after yours.  Also, if you miss three or more appointments without notifying the office, you may be dismissed from the clinic at the provider's discretion.      For prescription refill requests, have your pharmacy contact our office and allow 72 hours for refills to be completed.    Today you received the following chemotherapy and/or immunotherapy agents: trastuzumab-anns      To help prevent nausea and vomiting after your treatment, we encourage you to take your nausea medication as directed.  BELOW ARE SYMPTOMS THAT SHOULD BE REPORTED IMMEDIATELY: *FEVER GREATER THAN 100.4 F (38 C) OR HIGHER *CHILLS OR SWEATING *NAUSEA AND VOMITING THAT IS NOT CONTROLLED WITH YOUR NAUSEA MEDICATION *UNUSUAL SHORTNESS OF BREATH *UNUSUAL BRUISING OR BLEEDING *URINARY PROBLEMS (pain or burning when urinating, or frequent urination) *BOWEL PROBLEMS (unusual diarrhea, constipation, pain near the anus) TENDERNESS IN MOUTH AND THROAT WITH OR WITHOUT PRESENCE OF ULCERS (sore throat, sores in mouth, or a toothache) UNUSUAL RASH, SWELLING OR PAIN  UNUSUAL VAGINAL DISCHARGE OR ITCHING   Items with * indicate a potential emergency and should be followed up as soon as possible or go to the Emergency Department if any problems should occur.  Please show the CHEMOTHERAPY ALERT CARD or IMMUNOTHERAPY ALERT CARD at  check-in to the Emergency Department and triage nurse.  Should you have questions after your visit or need to cancel or reschedule your appointment, please contact [facility]  Dept: [phone]  and follow the prompts.  Office hours are 8:00 a.m. to 4:30 p.m. Monday - Friday. Please note that voicemails left after 4:00 p.m. may not be returned until the following business day.  We are closed weekends and major holidays. You have access to a nurse at all times for urgent questions. Please call the main number to the clinic Dept: [phone] and follow the prompts.   For any non-urgent questions, you may also contact your provider using MyChart. We now offer e-Visits for anyone 48 and older to request care online for non-urgent symptoms. For details visit [email or web address].   Also download the MyChart app! Go to the app store, search "MyChart", open the app, select [facility], and log in with your MyChart username and password.  Masks are optional in the cancer centers. If you would like for your care team to wear a mask while they are taking care of you, please let them know. You may have one support person who is at least 64 years old accompany you for your appointments.

## 2022-08-17 ENCOUNTER — Encounter (HOSPITAL_BASED_OUTPATIENT_CLINIC_OR_DEPARTMENT_OTHER): Payer: Medicare Other | Admitting: Internal Medicine

## 2022-09-01 ENCOUNTER — Telehealth: Payer: Self-pay | Admitting: *Deleted

## 2022-09-01 NOTE — Telephone Encounter (Signed)
[name],  This pt's BMI is greater than 50; there procedure will need to be performed at the hospital.  Thanks,  Osvaldo Angst

## 2022-09-02 NOTE — Telephone Encounter (Signed)
Procedure scheduled at hospital w/Dr Havery Moros 09-21-22.

## 2022-09-03 ENCOUNTER — Inpatient Hospital Stay: Payer: Medicare Other

## 2022-09-03 ENCOUNTER — Inpatient Hospital Stay: Payer: Medicare Other | Attending: Hematology and Oncology | Admitting: Hematology and Oncology

## 2022-09-03 ENCOUNTER — Other Ambulatory Visit: Payer: Self-pay | Admitting: Hematology and Oncology

## 2022-09-03 ENCOUNTER — Other Ambulatory Visit: Payer: Self-pay

## 2022-09-03 VITALS — BP 145/93 | HR 76 | Temp 97.3°F | Resp 17 | Wt 240.6 lb

## 2022-09-03 DIAGNOSIS — Z7901 Long term (current) use of anticoagulants: Secondary | ICD-10-CM | POA: Diagnosis not present

## 2022-09-03 DIAGNOSIS — Z801 Family history of malignant neoplasm of trachea, bronchus and lung: Secondary | ICD-10-CM | POA: Insufficient documentation

## 2022-09-03 DIAGNOSIS — Z95828 Presence of other vascular implants and grafts: Secondary | ICD-10-CM

## 2022-09-03 DIAGNOSIS — Z90722 Acquired absence of ovaries, bilateral: Secondary | ICD-10-CM | POA: Diagnosis not present

## 2022-09-03 DIAGNOSIS — C773 Secondary and unspecified malignant neoplasm of axilla and upper limb lymph nodes: Secondary | ICD-10-CM | POA: Diagnosis present

## 2022-09-03 DIAGNOSIS — C50812 Malignant neoplasm of overlapping sites of left female breast: Secondary | ICD-10-CM

## 2022-09-03 DIAGNOSIS — Z923 Personal history of irradiation: Secondary | ICD-10-CM | POA: Insufficient documentation

## 2022-09-03 DIAGNOSIS — Z79899 Other long term (current) drug therapy: Secondary | ICD-10-CM | POA: Insufficient documentation

## 2022-09-03 DIAGNOSIS — Z90710 Acquired absence of both cervix and uterus: Secondary | ICD-10-CM | POA: Diagnosis not present

## 2022-09-03 DIAGNOSIS — Z86718 Personal history of other venous thrombosis and embolism: Secondary | ICD-10-CM | POA: Insufficient documentation

## 2022-09-03 DIAGNOSIS — Z8041 Family history of malignant neoplasm of ovary: Secondary | ICD-10-CM | POA: Diagnosis not present

## 2022-09-03 DIAGNOSIS — Z171 Estrogen receptor negative status [ER-]: Secondary | ICD-10-CM | POA: Diagnosis not present

## 2022-09-03 DIAGNOSIS — Z8542 Personal history of malignant neoplasm of other parts of uterus: Secondary | ICD-10-CM | POA: Diagnosis not present

## 2022-09-03 DIAGNOSIS — Z803 Family history of malignant neoplasm of breast: Secondary | ICD-10-CM | POA: Insufficient documentation

## 2022-09-03 DIAGNOSIS — Z5112 Encounter for antineoplastic immunotherapy: Secondary | ICD-10-CM | POA: Diagnosis present

## 2022-09-03 DIAGNOSIS — Z9079 Acquired absence of other genital organ(s): Secondary | ICD-10-CM | POA: Insufficient documentation

## 2022-09-03 DIAGNOSIS — C50112 Malignant neoplasm of central portion of left female breast: Secondary | ICD-10-CM

## 2022-09-03 LAB — CMP (CANCER CENTER ONLY)
ALT: 14 U/L (ref 0–44)
AST: 16 U/L (ref 15–41)
Albumin: 4 g/dL (ref 3.5–5.0)
Alkaline Phosphatase: 48 U/L (ref 38–126)
Anion gap: 7 (ref 5–15)
BUN: 36 mg/dL — ABNORMAL HIGH (ref 8–23)
CO2: 30 mmol/L (ref 22–32)
Calcium: 9.7 mg/dL (ref 8.9–10.3)
Chloride: 104 mmol/L (ref 98–111)
Creatinine: 1.1 mg/dL — ABNORMAL HIGH (ref 0.44–1.00)
GFR, Estimated: 56 mL/min — ABNORMAL LOW (ref >60)
Glucose, Bld: 144 mg/dL — ABNORMAL HIGH (ref 70–99)
Potassium: 3.4 mmol/L — ABNORMAL LOW (ref 3.5–5.1)
Sodium: 141 mmol/L (ref 135–145)
Total Bilirubin: 0.4 mg/dL (ref 0.3–1.2)
Total Protein: 8.2 g/dL — ABNORMAL HIGH (ref 6.5–8.1)

## 2022-09-03 LAB — CBC WITH DIFFERENTIAL (CANCER CENTER ONLY)
Abs Immature Granulocytes: 0.01 10*3/uL (ref 0.00–0.07)
Basophils Absolute: 0 10*3/uL (ref 0.0–0.1)
Basophils Relative: 0 %
Eosinophils Absolute: 0.1 10*3/uL (ref 0.0–0.5)
Eosinophils Relative: 2 %
HCT: 32.3 % — ABNORMAL LOW (ref 36.0–46.0)
Hemoglobin: 10.3 g/dL — ABNORMAL LOW (ref 12.0–15.0)
Immature Granulocytes: 0 %
Lymphocytes Relative: 25 %
Lymphs Abs: 1.4 10*3/uL (ref 0.7–4.0)
MCH: 28.5 pg (ref 26.0–34.0)
MCHC: 31.9 g/dL (ref 30.0–36.0)
MCV: 89.5 fL (ref 80.0–100.0)
Monocytes Absolute: 0.6 10*3/uL (ref 0.1–1.0)
Monocytes Relative: 11 %
Neutro Abs: 3.3 10*3/uL (ref 1.7–7.7)
Neutrophils Relative %: 62 %
Platelet Count: 199 10*3/uL (ref 150–400)
RBC: 3.61 MIL/uL — ABNORMAL LOW (ref 3.87–5.11)
RDW: 14.4 % (ref 11.5–15.5)
WBC Count: 5.4 10*3/uL (ref 4.0–10.5)
nRBC: 0 % (ref 0.0–0.2)

## 2022-09-03 MED ORDER — SODIUM CHLORIDE 0.9% FLUSH
10.0000 mL | Freq: Once | INTRAVENOUS | Status: AC
  Administered 2022-09-03: 10 mL

## 2022-09-03 MED ORDER — EPOETIN ALFA-EPBX 40000 UNIT/ML IJ SOLN: 40000.0000 [IU] | Freq: Once | INTRAMUSCULAR | Status: DC

## 2022-09-03 MED ORDER — HEPARIN SOD (PORK) LOCK FLUSH 100 UNIT/ML IV SOLN
500.0000 [IU] | Freq: Once | INTRAVENOUS | Status: AC | PRN
  Administered 2022-09-03: 500 [IU]

## 2022-09-03 MED ORDER — ACETAMINOPHEN 325 MG PO TABS
650.0000 mg | ORAL_TABLET | Freq: Once | ORAL | Status: AC
  Administered 2022-09-03: 650 mg via ORAL
  Filled 2022-09-03: qty 2

## 2022-09-03 MED ORDER — TRASTUZUMAB-ANNS CHEMO 150 MG IV SOLR
6.0000 mg/kg | Freq: Once | INTRAVENOUS | Status: AC
  Administered 2022-09-03: 630 mg via INTRAVENOUS
  Filled 2022-09-03: qty 30

## 2022-09-03 MED ORDER — DIPHENHYDRAMINE HCL 25 MG PO CAPS
25.0000 mg | ORAL_CAPSULE | Freq: Once | ORAL | Status: AC
  Administered 2022-09-03: 25 mg via ORAL
  Filled 2022-09-03: qty 1

## 2022-09-03 MED ORDER — SODIUM CHLORIDE 0.9% FLUSH
10.0000 mL | INTRAVENOUS | Status: DC | PRN
  Administered 2022-09-03: 10 mL

## 2022-09-03 MED ORDER — SODIUM CHLORIDE 0.9 % IV SOLN: Freq: Once | INTRAVENOUS | Status: AC

## 2022-09-03 MED ORDER — HEPARIN SOD (PORK) LOCK FLUSH 100 UNIT/ML IV SOLN
500.0000 [IU] | Freq: Once | INTRAVENOUS | Status: AC
  Administered 2022-09-03: 500 [IU]

## 2022-09-03 NOTE — Patient Instructions (Signed)
[facility]   Discharge Instructions: Thank you for choosing [facility] to provide your oncology and hematology care.   If you have a lab appointment with the [facility], please go directly to the [facility] and check in at the registration area.   Wear comfortable clothing and clothing appropriate for easy access to any Portacath or PICC line.   We strive to give you quality time with your provider. You may need to reschedule your appointment if you arrive late (15 or more minutes).  Arriving late affects you and other patients whose appointments are after yours.  Also, if you miss three or more appointments without notifying the office, you may be dismissed from the clinic at the provider's discretion.      For prescription refill requests, have your pharmacy contact our office and allow 72 hours for refills to be completed.    Today you received the following chemotherapy and/or immunotherapy agents: trastuzumab-anns      To help prevent nausea and vomiting after your treatment, we encourage you to take your nausea medication as directed.  BELOW ARE SYMPTOMS THAT SHOULD BE REPORTED IMMEDIATELY: *FEVER GREATER THAN 100.4 F (38 C) OR HIGHER *CHILLS OR SWEATING *NAUSEA AND VOMITING THAT IS NOT CONTROLLED WITH YOUR NAUSEA MEDICATION *UNUSUAL SHORTNESS OF BREATH *UNUSUAL BRUISING OR BLEEDING *URINARY PROBLEMS (pain or burning when urinating, or frequent urination) *BOWEL PROBLEMS (unusual diarrhea, constipation, pain near the anus) TENDERNESS IN MOUTH AND THROAT WITH OR WITHOUT PRESENCE OF ULCERS (sore throat, sores in mouth, or a toothache) UNUSUAL RASH, SWELLING OR PAIN  UNUSUAL VAGINAL DISCHARGE OR ITCHING   Items with * indicate a potential emergency and should be followed up as soon as possible or go to the Emergency Department if any problems should occur.  Please show the CHEMOTHERAPY ALERT CARD or IMMUNOTHERAPY ALERT CARD at  check-in to the Emergency Department and triage nurse.  Should you have questions after your visit or need to cancel or reschedule your appointment, please contact [facility]  Dept: [phone]  and follow the prompts.  Office hours are 8:00 a.m. to 4:30 p.m. Monday - Friday. Please note that voicemails left after 4:00 p.m. may not be returned until the following business day.  We are closed weekends and major holidays. You have access to a nurse at all times for urgent questions. Please call the main number to the clinic Dept: [phone] and follow the prompts.   For any non-urgent questions, you may also contact your provider using MyChart. We now offer e-Visits for anyone 2 and older to request care online for non-urgent symptoms. For details visit [email or web address].   Also download the MyChart app! Go to the app store, search "MyChart", open the app, select [facility], and log in with your MyChart username and password.  Masks are optional in the cancer centers. If you would like for your care team to wear a mask while they are taking care of you, please let them know. You may have one support [name] who is at least 64 years old accompany you for your appointments.
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
## 2022-09-04 ENCOUNTER — Encounter (HOSPITAL_BASED_OUTPATIENT_CLINIC_OR_DEPARTMENT_OTHER): Payer: Medicare Other | Attending: Internal Medicine | Admitting: Internal Medicine

## 2022-09-04 DIAGNOSIS — G4730 Sleep apnea, unspecified: Secondary | ICD-10-CM | POA: Diagnosis not present

## 2022-09-04 DIAGNOSIS — E1151 Type 2 diabetes mellitus with diabetic peripheral angiopathy without gangrene: Secondary | ICD-10-CM | POA: Diagnosis not present

## 2022-09-04 DIAGNOSIS — E11622 Type 2 diabetes mellitus with other skin ulcer: Secondary | ICD-10-CM | POA: Diagnosis not present

## 2022-09-04 DIAGNOSIS — Z9221 Personal history of antineoplastic chemotherapy: Secondary | ICD-10-CM | POA: Insufficient documentation

## 2022-09-04 DIAGNOSIS — Z6841 Body Mass Index (BMI) 40.0 and over, adult: Secondary | ICD-10-CM | POA: Diagnosis not present

## 2022-09-04 DIAGNOSIS — Z923 Personal history of irradiation: Secondary | ICD-10-CM | POA: Diagnosis not present

## 2022-09-04 DIAGNOSIS — I10 Essential (primary) hypertension: Secondary | ICD-10-CM | POA: Insufficient documentation

## 2022-09-04 DIAGNOSIS — L98412 Non-pressure chronic ulcer of buttock with fat layer exposed: Secondary | ICD-10-CM | POA: Insufficient documentation

## 2022-09-04 DIAGNOSIS — Z96653 Presence of artificial knee joint, bilateral: Secondary | ICD-10-CM | POA: Insufficient documentation

## 2022-09-04 DIAGNOSIS — M1990 Unspecified osteoarthritis, unspecified site: Secondary | ICD-10-CM | POA: Diagnosis not present

## 2022-09-04 DIAGNOSIS — Z853 Personal history of malignant neoplasm of breast: Secondary | ICD-10-CM | POA: Insufficient documentation

## 2022-09-04 NOTE — Progress Notes (Signed)
KISSA, CAMPOY ([id number]) [id number] Page 1 of 8 Visit Report for 09/04/2022 Chief Complaint Document Details Patient Name: Date of Service: [name] 09/04/2022 9:15 A M Medical Record Number: [id number] Patient Account Number: [id number] Date of Birth/Sex: Treating RN: 07/12/1956 (65 y.o. F) Primary Care Provider: Cathlean Cower Other Clinician: Referring Provider: Treating Provider/Extender: Hollie Beach in Treatment: 4 Information Obtained from: Patient Chief Complaint 08/03/2022; several year history of waxing and waning to a wound to the intergluteal cleft Electronic Signature(s) Signed: 09/04/2022 10:08:50 AM By: Kalman Shan DO Entered By: Kalman Shan on 09/04/2022 10:05:39 -------------------------------------------------------------------------------- HPI Details Patient Name: Date of Service: Jacqueline Lyons Young. 09/04/2022 9:15 A M Medical Record Number: [id number] Patient Account Number: [id number] Date of Birth/Sex: Treating RN: 12-09-1955 (66 y.o. F) Primary Care Provider: Cathlean Cower Other Clinician: Referring Provider: Treating Provider/Extender: Hollie Beach in Treatment: 4 History of Present Illness HPI Description: 11/02/2019 ADMISSION This is a 66 year old woman who has a wound on the right posterior Achilles area towards the distal end of the Achilles tendon surgery scars. She tells me that roughly 2-1/2 to 3 weeks ago she was picking at some dry skin in the area pulled it off and that is how the wound opened up. She had an original spontaneous Achilles tendon rupture in the early 1990s that was repaired and then very shortly thereafter it reruptured and she required another surgery. She has not had any problems with this since then. The wound itself currently has episodic sharp pain some swelling. She was put on Augmentin by her primary physician  on 10/30/2019 for possible coexistent cellulitis. She used hydrogen peroxide for a while with topical antibiotics. Mostly she has been using topical antibiotics as somebody told her that hydrogen peroxide was not good to use. 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 Surgery was in [place], bilateral total knee replacements. ABIs in our clinic on the right were 1.09 1/21; patient here with a linear wound in the right Achilles area. Her wrap fell down she did not call us to replace it. She has questions about her compression wrap necessity. Finally she asked if the wound could be sutured The patient is under a lot of pressure with initiation of chemotherapy she is going to have a port placed etc. 1/28; linear wound in the right Achilles area in the setting of previous surgery. She is going for a port for chemotherapy for her breast cancer. She has apparently made herself an appointment with orthopedic surgery. I am not sure who she has made an appointment with at this point. 2/12 linear wound in the right Achilles is come in in terms of length. This is in the setting of previous surgery in this area The patient is undergoing chemotherapy for breast cancer and having significant side effects [diarrhea] I am trying not to bring in her in too often as long as her wound is contracted 3/5; linear wound in the right Achilles. Not quite as long however still some measurable depth. She tells [place] that she has had problems with chemotherapy also apparently pseudomembranous colitis. She has not had much attention to paid to the wound. Came in without any  dressing on etc. 4/16; right Achilles. She is completely closed 08/03/2022 Ms.  [name] is a 66 year old female with a past medical history of left sided breast cancer currently on chemotherapy, morbid obesity and [name] ([id number]) [id number] Page 2 of 8 currently controlled type 2 diabetes that presents the clinic for a wound to her intergluteal cleft. She states that this is an area that [place] and wanes in healing. She does report sitting for most of the day. She has been using zinc oxide to the area. She currently denies signs of infection. 10/23; patient presents for follow-up. She has been using AandD ointment to the wound bed. She did not receive her supplies from [place]. She has no issues or complaints today. 11/17; patient missed her last clinic appointment. She states she has been using AandD ointment to the wound bed along with silver alginate. She has no issues or complaints today. Electronic Signature(s) Signed: 09/04/2022 10:08:50 AM By: Kalman Shan DO Entered By: Kalman Shan on 09/04/2022 10:06:32 -------------------------------------------------------------------------------- Physical Exam Details Patient Name: Date of Service: Jacqueline Lyons Young. 09/04/2022 9:15 A M Medical Record Number: [id number] Patient Account Number: [id number] Date of Birth/Sex: Treating RN: 1956/09/21 (66 y.o. F) Primary Care Provider: Cathlean Cower Other Clinician: Referring Provider: Treating Provider/Extender: Hollie Beach in Treatment: 4 Constitutional respirations regular, non-labored and within target range for patient.[name] Psychiatric pleasant and cooperative. Notes T the intergluteal cleft there is a small wound limited to skin breakdown. No signs of infection. o Electronic Signature(s) Signed: 09/04/2022 10:08:50 AM By: Kalman Shan DO Entered By: Kalman Shan on 09/04/2022  10:07:16 -------------------------------------------------------------------------------- Physician Orders Details Patient Name: Date of Service: Jacqueline Lyons Young. 09/04/2022 9:15 A M Medical Record Number: [id number] Patient Account Number: [id number] Date of Birth/Sex: Treating RN: 03/08/1956 (66 y.o. Debby Bud Primary Care Provider: Cathlean Cower Other Clinician: Referring Provider: Treating Provider/Extender: Hollie Beach in Treatment: 4 Verbal / Phone Orders: No Diagnosis Coding ICD-10 Coding Code Description E11.622 Type 2 diabetes mellitus with other skin ulcer L98.412 Non-pressure chronic ulcer of buttock with fat layer exposed Follow-up Appointments ppointment in 2 weeks. - Dr. Heber Bonney Return A Bathing/ Shower/ Hygiene May shower with protection but do not get wound dressing(s) wet. JEWELDEAN, DROHAN ([id number]) [id number] Page 3 of 8 Off-Loading Turn and reposition every 2 hours Wound Treatment Wound #2 - Sacrum Cleanser: Anasept Antimicrobial Skin and Wound Cleanser, 8 (oz) (Generic) 2 x Per Day/15 Days Discharge Instructions: Cleanse the wound with Anasept cleanser prior to applying a clean dressing using gauze sponges, not tissue or cotton balls. Cleanser: Anasept Antimicrobial Skin and Wound Cleanser, 8 (oz) (Generic) 2 x Per Day/15 Days Discharge Instructions: Cleanse the wound with Anasept cleanser prior to applying a clean dressing using gauze sponges, not tissue or cotton balls. Peri-Wound Care: Gentell AandD+E Ointment, Tube 2 (oz) 2 x Per Day/15 Days Prim Dressing: KerraCel Ag Gelling Fiber Dressing, 4x5 in (silver alginate) (Generic) 2 x Per Day/15 Days ary Discharge Instructions: Apply silver alginate to wound bed as instructed Secondary Dressing: Woven Gauze Sponge, Non-Sterile 4x4 in (Generic) 2 x Per Day/15 Days Discharge Instructions: Apply over primary dressing as directed. Secondary  Dressing: Zetuvit Plus Silicone Border Dressing 7x7(in/in) (Generic) 2 x Per Day/15 Days Discharge Instructions: Apply silicone border over primary dressing as directed. Electronic Signature(s) Signed: 09/04/2022 10:08:50 AM By: Kalman Shan DO Entered By: Kalman Shan on 09/04/2022 10:07:23 -------------------------------------------------------------------------------- Problem List Details Patient Name: Date  of Service: Jacqueline Lyons Young. 09/04/2022 9:15 A M Medical Record Number: [id number] Patient Account Number: [id number] Date of Birth/Sex: Treating RN: 1956/04/27 (66 y.o. Debby Bud Primary Care Provider: Cathlean Cower Other Clinician: Referring Provider: Treating Provider/Extender: Hollie Beach in Treatment: 4 Active Problems ICD-10 Encounter Code Description Active Date MDM Diagnosis E11.622 Type 2 diabetes mellitus with other skin ulcer 08/03/2022 No Yes L98.412 Non-pressure chronic ulcer of buttock with fat layer exposed 08/03/2022 No Yes Inactive Problems Resolved Problems Electronic Signature(s) Signed: 09/04/2022 10:08:50 AM By: Kalman Shan DO Entered By: Kalman Shan on 09/04/2022 10:05:24 [name] ([id number]) [id number] Page 4 of 8 -------------------------------------------------------------------------------- Progress Note Details Patient Name: Date of Service: [name] 09/04/2022 9:15 A M Medical Record Number: [id number] Patient Account Number: [id number] Date of Birth/Sex: Treating RN: 1956/06/01 (66 y.o. F) Primary Care Provider: Cathlean Cower Other Clinician: Referring Provider: Treating Provider/Extender: Hollie Beach in Treatment: 4 Subjective Chief Complaint Information obtained from Patient 08/03/2022; several year history of waxing and waning to a wound to the intergluteal cleft History of Present Illness  (HPI) 11/02/2019 ADMISSION This is a 66 year old woman who has a wound on the right posterior Achilles area towards the distal end of the Achilles tendon surgery scars. She tells me that roughly 2-1/2 to 3 weeks ago she was picking at some dry skin in the area pulled it off and that is how the wound opened up. She had an original spontaneous Achilles tendon rupture in the early 1990s that was repaired and then very shortly thereafter it reruptured and she required another surgery. She has not had any problems with this since then. The wound itself currently has episodic sharp pain some swelling. She was put on Augmentin by her primary physician on 10/30/2019 for possible coexistent cellulitis. She used hydrogen peroxide for a while with topical antibiotics. Mostly she has been using topical antibiotics as somebody told her that hydrogen peroxide was not good to use. 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 Surgery was in [place], bilateral total knee replacements. ABIs in our clinic on the right were 1.09 1/21; patient here with a linear wound in the right Achilles area. Her wrap fell down she did not call us to replace it. She has questions about her compression wrap necessity. Finally she asked if the wound could be sutured The patient is under a lot of pressure with initiation of chemotherapy she is going to have a port placed etc. 1/28; linear wound in the right Achilles area in the setting of previous surgery. She is going for a port for chemotherapy for her breast cancer. She has apparently made herself an appointment with orthopedic surgery. I am not sure who she has made an appointment with at this point. 2/12  linear wound in the right Achilles is come in in terms of length. This is in the setting of previous surgery in this area The patient is undergoing chemotherapy for breast cancer and having significant side effects [diarrhea] I am trying not to bring in her in too often as long as her wound is contracted 3/5; linear wound in the right Achilles. Not quite as long however still  some measurable depth. She tells [place] that she has had problems with chemotherapy also apparently pseudomembranous colitis. She has not had much attention to paid to the wound. Came in without any dressing on etc. 4/16; right Achilles. She is completely closed 08/03/2022 [name] is a 66 year old female with a past medical history of left sided breast cancer currently on chemotherapy, morbid obesity and currently controlled type 2 diabetes that presents the clinic for a wound to her intergluteal cleft. She states that this is an area that [place] and wanes in healing. She does report sitting for most of the day. She has been using zinc oxide to the area. She currently denies signs of infection. 10/23; patient presents for follow-up. She has been using AandD ointment to the wound bed. She did not receive her supplies from [place]. She has no issues or complaints today. 11/17; patient missed her last clinic appointment. She states she has been using AandD ointment to the wound bed along with silver alginate. She has no issues or complaints today. Patient History Information obtained from Patient. Family History Diabetes - Paternal Grandparents,Mother,Father, Heart Disease - Father, Hypertension - Mother,Father, No family history of Cancer, Hereditary Spherocytosis, Kidney Disease, Lung Disease, Seizures, Stroke, Thyroid Problems, Tuberculosis. 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 Medical  History Hematologic/Lymphatic Patient has history of Anemia, Lymphedema Respiratory Patient has history of Sleep Apnea - no CPAP Cardiovascular Patient has history of Hypertension Endocrine Patient has history of Type II Diabetes Genitourinary Denies history of End Stage Renal Disease Integumentary (Skin) Denies history of History of Burn Musculoskeletal [name], [name] ([id number]) [id number] Page 5 of 8 Patient has history of Osteoarthritis Oncologic Patient has history of Received Radiation Denies history of Received Chemotherapy Psychiatric Denies history of Anorexia/bulimia, Confinement Anxiety Hospitalization/Surgery History - bilk knee replacements. - hysterectomy. - left breast lumpectomy. - left achilles repair. - right achilles repair. - c-section. - neck surgery-pinched spinal cord. Medical A Surgical History Notes nd Constitutional Symptoms (General Health) morbid obesity Genitourinary urinary incontinence Oncologic hx left breast CA with recurring metastatic disease, hx uterine CA Objective Constitutional respirations regular, non-labored and within target range for patient.. Vitals Time Taken: 9:18 AM, Temperature: 98.7 F, Pulse: 74 bpm, Respiratory Rate: 17 breaths/min, Blood Pressure: 146/84 mmHg. Psychiatric pleasant and cooperative. General Notes: T the intergluteal cleft there is a small wound limited to skin breakdown. No signs of infection. o Integumentary (Hair, Skin) Wound #2 status is Open. Original cause of wound was Gradually Appeared. The date acquired was: 10/19/2005. The wound has been in treatment 4 weeks. The wound is located on the Sacrum. The wound measures 0.5cm length x 0.5cm width x 0.1cm depth; 0.196cm^2 area and 0.02cm^3 volume. There is Fat Layer (Subcutaneous Tissue) exposed. There is no tunneling or undermining noted. There is a medium amount of serosanguineous drainage noted. The wound margin is  distinct with the outline attached to the wound base. There is large (67-100%) red, pink granulation within the wound bed. There is a small (1-33%) amount of necrotic tissue within the wound bed. The periwound skin appearance did not exhibit: Callus, Crepitus, Excoriation, Induration, Rash, Scarring, Dry/Scaly, Maceration, Atrophie Blanche, Cyanosis, Ecchymosis, Hemosiderin Staining, Mottled, Pallor, Rubor, Erythema. Periwound temperature was noted as No Abnormality. The periwound has tenderness on palpation. Assessment Active Problems ICD-10 Type 2 diabetes mellitus with other skin ulcer Non-pressure chronic ulcer of buttock with fat  layer exposed Patient's wound has shown improvement in size and appearance since last clinic visit. I recommended continuing with AandD ointment and silver alginate. Follow-up in 2 weeks. Plan Follow-up Appointments: Return Appointment in 2 weeks. - Dr. Heber [place] Bathing/ Shower/ Hygiene: May shower with protection but do not get wound dressing(s) wet. Off-Loading: Turn and reposition every 2 hours WOUND #2: - Sacrum Wound Laterality: Cleanser: Anasept Antimicrobial Skin and Wound Cleanser, 8 (oz) (Generic) 2 x Per Day/15 Days Discharge Instructions: Cleanse the wound with Anasept cleanser prior to applying a clean dressing using gauze sponges, not tissue or cotton balls. Cleanser: Anasept Antimicrobial Skin and Wound Cleanser, 8 (oz) (Generic) 2 x Per Day/15 Days Discharge Instructions: Cleanse the wound with Anasept cleanser prior to applying a clean dressing using gauze sponges, not tissue or cotton balls. Peri-Wound Care: Gentell AandD+E Ointment, Tube 2 (oz) 2 x Per Day/15 Days Prim Dressing: KerraCel Ag Gelling Fiber Dressing, 4x5 in (silver alginate) (Generic) 2 x Per Day/15 Days [name], [name] ([id number]) [id number] Page 6 of 8 Discharge Instructions: Apply silver alginate to wound bed as instructed Secondary  Dressing: Woven Gauze Sponge, Non-Sterile 4x4 in (Generic) 2 x Per Day/15 Days Discharge Instructions: Apply over primary dressing as directed. Secondary Dressing: Zetuvit Plus Silicone Border Dressing 7x7(in/in) (Generic) 2 x Per Day/15 Days Discharge Instructions: Apply silicone border over primary dressing as directed. 1. Silver alginate 2. AandD ointment 3. Aggressive offloading 4. Anasept wound cleanser 5. Follow-up in 1 week Electronic Signature(s) Signed: 09/04/2022 10:08:50 AM By: Kalman Shan DO Entered By: Kalman Shan on 09/04/2022 10:08:08 -------------------------------------------------------------------------------- HxROS Details Patient Name: Date of Service: [name], Domenick Gong Young. 09/04/2022 9:15 A M Medical Record Number: [id number] Patient Account Number: [id number] Date of Birth/Sex: Treating RN: 07-18-1956 (66 y.o. F) Primary Care Provider: Cathlean Cower Other Clinician: Referring Provider: Treating Provider/Extender: Hollie Beach in Treatment: 4 Information Obtained From Patient Constitutional Symptoms (General Health) Medical History: Past Medical History Notes: morbid obesity Hematologic/Lymphatic Medical History: Positive for: Anemia; Lymphedema Respiratory Medical History: Positive for: Sleep Apnea - no CPAP Cardiovascular Medical History: Positive for: Hypertension Endocrine Medical History: Positive for: Type II Diabetes Time with diabetes: 5 yrs Treated with: Oral agents Blood sugar tested every day: No Genitourinary Medical History: Negative for: End Stage Renal Disease Past Medical History Notes: urinary incontinence Integumentary (Skin) Medical HistoryDAVONNE, BABY ([id number]) [id number] Page 7 of 8 Negative for: History of Burn Musculoskeletal Medical History: Positive for: Osteoarthritis Oncologic Medical History: Positive for: Received Radiation Negative  for: Received Chemotherapy Past Medical History Notes: hx left breast CA with recurring metastatic disease, hx uterine CA Psychiatric Medical History: Negative for: Anorexia/bulimia; Confinement Anxiety Immunizations Pneumococcal Vaccine: Received Pneumococcal Vaccination: Yes Received Pneumococcal Vaccination On or After 60th Birthday: Yes Implantable Devices Yes Hospitalization / Surgery History Type of Hospitalization/Surgery bilk knee replacements hysterectomy left breast lumpectomy left achilles repair right achilles repair c-section neck surgery-pinched spinal cord Family and Social History Cancer: No; Diabetes: Yes - Paternal Grandparents,Mother,Father; Heart Disease: Yes - Father; Hereditary Spherocytosis: No; Hypertension: Yes - Mother,Father; Kidney Disease: No; Lung Disease: No; Seizures: No; Stroke: No; Thyroid Problems: No; Tuberculosis: No; Never smoker; Marital Status - Divorced; Alcohol Use: Rarely; Drug Use: Prior History - TCH; Caffeine Use: Daily - tea; Financial Concerns: No; Food, Clothing or Shelter Needs: No; Support System Lacking: No; Transportation Concerns: Yes - will have multiple appointments for breast CA Electronic Signature(s) Signed: 09/04/2022 10:08:50 AM By: Kalman Shan DO Entered By:  Kalman Shan on 09/04/2022 10:06:42 -------------------------------------------------------------------------------- SuperBill Details Patient Name: Date of Service: [name] 09/04/2022 Medical Record Number: [id number] Patient Account Number: [id number] Date of Birth/Sex: Treating RN: Mar 29, 1956 (66 y.o. Debby Bud Primary Care Provider: Cathlean Cower Other Clinician: Referring Provider: Treating Provider/Extender: Hollie Beach in Treatment: 4 Diagnosis Coding ICD-10 Codes Code Description [id number] Type 2 diabetes mellitus with other skin ulcer L98.412 Non-pressure chronic ulcer of buttock with fat layer  exposed Facility Procedures LEISL, SPURRIER ([id number]): CPT4 Code Description [id number] [place] VISIT-LEV 3 EST PT [id number] Page 8 of 8: Modifier Quantity 1 Physician Procedures : CPT4 Code Description Modifier [id number] - WC PHYS LEVEL 3 - EST PT ICD-10 Diagnosis Description E11.622 Type 2 diabetes mellitus with other skin ulcer L98.412 Non-pressure chronic ulcer of buttock with fat layer exposed Quantity: 1 Electronic Signature(s) Signed: 09/04/2022 10:08:50 AM By: Kalman Shan DO Entered By: Kalman Shan on 09/04/2022 10:08:18

## 2022-09-05 ENCOUNTER — Other Ambulatory Visit: Payer: Self-pay

## 2022-09-05 NOTE — Progress Notes (Signed)
[name], [name] ([id number]) [id number] Page 1 of 9 Visit Report for 09/04/2022 Arrival Information Details Patient Name: Date of Service: [place] [name] 09/04/2022 9:15 A M Medical Record Number: [id number] Patient Account Number: [id number] Date of Birth/Sex: Treating RN: [birth year] (66 y.o. [name], [name] Donnavin Vandenbrink: Cathlean Cower Other Clinician: Referring Hobert Poplaski: Treating Tyrice Hewitt/Extender: Hollie Beach in Treatment: 4 Visit Information History Since Last Visit Added or deleted any medications: No Patient Arrived: [name] Any new allergies or adverse reactions: No Arrival Time: 09:17 Had a fall or experienced change in No Accompanied By: self activities of daily living that may affect Transfer Assistance: Manual risk of falls: Patient Identification Verified: Yes Signs or symptoms of abuse/neglect since last visito No Secondary Verification Process Completed: Yes Hospitalized since last visit: No Patient Requires Transmission-Based Precautions: No Implantable device outside of the clinic excluding No Patient Has Alerts: No cellular tissue based products placed in the center since last visit: Has Dressing in Place as Prescribed: Yes Pain Present Now: No Electronic Signature(s) Signed: 09/04/2022 12:09:11 PM By: Rhae Hammock RN Entered By: Rhae Hammock on 09/04/2022 09:18:27 -------------------------------------------------------------------------------- Clinic Level of Care Assessment Details Patient Name: Date of Service: [facility] Jacqueline Messier V. 09/04/2022 9:15 A M Medical Record Number: [id number] Patient Account Number: [id number] Date of Birth/Sex: Treating RN: January 08, 1956 (66 y.o. [name] Primary Care Quinisha Mould: Cathlean Cower Other Clinician: Referring Colandra Ohanian: Treating Shree Espey/Extender: Hollie Beach in Treatment: 4 Clinic Level of Care  Assessment Items TOOL 4 Quantity Score X- 1 0 Use when only an EandM is performed on FOLLOW-UP visit ASSESSMENTS - Nursing Assessment / Reassessment X- 1 10 Reassessment of Co-morbidities (includes updates in patient status) X- 1 5 Reassessment of Adherence to Treatment Plan ASSESSMENTS - Wound and Skin A ssessment / Reassessment X - Simple Wound Assessment / Reassessment - one wound 1 5 '[]'$  - 0 Complex Wound Assessment / Reassessment - multiple wounds '[]'$  - 0 Dermatologic / Skin Assessment (not related to wound area) ASSESSMENTS - Focused Assessment '[]'$  - 0 Circumferential Edema Measurements - multi extremities '[]'$  - 0 Nutritional Assessment / Counseling / Intervention [name], [name] ([id number]) [id number] Page 2 of 9 '[]'$  - 0 Lower Extremity Assessment (monofilament, tuning fork, pulses) '[]'$  - 0 Peripheral Arterial Disease Assessment (using hand held doppler) ASSESSMENTS - Ostomy and/or Continence Assessment and Care '[]'$  - 0 Incontinence Assessment and Management '[]'$  - 0 Ostomy Care Assessment and Management (repouching, etc.) PROCESS - Coordination of Care X - Simple Patient / Family Education for ongoing care 1 15 '[]'$  - 0 Complex (extensive) Patient / Family Education for ongoing care X- 1 10 Staff obtains Programmer, systems, Records, T Results / Process Orders est '[]'$  - 0 Staff telephones HHA, Nursing Homes / Clarify orders / etc '[]'$  - 0 Routine Transfer to another Facility (non-emergent condition) '[]'$  - 0 Routine Hospital Admission (non-emergent condition) '[]'$  - 0 New Admissions / Biomedical engineer / Ordering NPWT Apligraf, etc. , '[]'$  - 0 Emergency Hospital Admission (emergent condition) X- 1 10 Simple Discharge Coordination '[]'$  - 0 Complex (extensive) Discharge Coordination PROCESS - Special Needs '[]'$  - 0 Pediatric / Minor Patient Management '[]'$  - 0 Isolation Patient Management '[]'$  - 0 Hearing / Language / Visual special needs '[]'$  -  0 Assessment of Community assistance (transportation, D/C planning, etc.) '[]'$  - 0 Additional assistance / Altered mentation '[]'$  - 0 Support Surface(s) Assessment (bed, cushion, seat, etc.) INTERVENTIONS - Wound Cleansing / Measurement  X - Simple Wound Cleansing - one wound 1 5 '[]'$  - 0 Complex Wound Cleansing - multiple wounds X- 1 5 Wound Imaging (photographs - any number of wounds) '[]'$  - 0 Wound Tracing (instead of photographs) X- 1 5 Simple Wound Measurement - one wound '[]'$  - 0 Complex Wound Measurement - multiple wounds INTERVENTIONS - Wound Dressings X - Small Wound Dressing one or multiple wounds 1 10 '[]'$  - 0 Medium Wound Dressing one or multiple wounds '[]'$  - 0 Large Wound Dressing one or multiple wounds '[]'$  - 0 Application of Medications - topical '[]'$  - 0 Application of Medications - injection INTERVENTIONS - Miscellaneous '[]'$  - 0 External ear exam '[]'$  - 0 Specimen Collection (cultures, biopsies, blood, body fluids, etc.) '[]'$  - 0 Specimen(s) / Culture(s) sent or taken to Lab for analysis '[]'$  - 0 Patient Transfer (multiple staff / Civil Service fast streamer / Similar devices) '[]'$  - 0 Simple Staple / Suture removal (25 or less) '[]'$  - 0 Complex Staple / Suture removal (26 or more) '[]'$  - 0 Hypo / Hyperglycemic Management (close monitor of Blood Glucose) [name], Noorah V ([id number]) [id number] Page 3 of 9 '[]'$  - 0 Ankle / Brachial Index (ABI) - do not check if billed separately X- 1 5 Vital Signs Has the patient been seen at the hospital within the last three years: Yes Total Score: 85 Level Of Care: New/Established - Level 3 Electronic Signature(s) Signed: 09/04/2022 5:21:52 PM By: Deon Pilling RN, BSN Entered By: Deon Pilling on 09/04/2022 09:57:24 -------------------------------------------------------------------------------- Encounter Discharge Information Details Patient Name: Date of Service: [name] V. 09/04/2022 9:15 A M Medical  Record Number: [id number] Patient Account Number: [id number] Date of Birth/Sex: Treating RN: [birth year] (66 y.o. [name] Primary Care Javae Braaten: Cathlean Cower Other Clinician: Referring Aarin Bluett: Treating Zaida Reiland/Extender: Hollie Beach in Treatment: 4 Encounter Discharge Information Items Discharge Condition: Stable Ambulatory Status: Cane Discharge Destination: Home Transportation: Private Auto Accompanied By: self Schedule Follow-up Appointment: Yes Clinical Summary of Care: Electronic Signature(s) Signed: 09/04/2022 5:21:52 PM By: Deon Pilling RN, BSN Entered By: Deon Pilling on 09/04/2022 09:58:56 -------------------------------------------------------------------------------- Lower Extremity Assessment Details Patient Name: Date of Service: [name] V. 09/04/2022 9:15 A M Medical Record Number: [id number] Patient Account Number: [id number] Date of Birth/Sex: Treating RN: 04-15-1956 (66 y.o. [name], [name] Brynn Reznik: Cathlean Cower Other Clinician: Referring Canna Nickelson: Treating Huel Centola/Extender: Hollie Beach in Treatment: 4 Electronic Signature(s) Signed: 09/04/2022 12:09:11 PM By: Rhae Hammock RN Entered By: Rhae Hammock on 09/04/2022 09:18:48 -------------------------------------------------------------------------------- Multi Wound Chart Details Patient Name: Date of Service: [name] V. 09/04/2022 9:15 A M Medical Record Number: [id number] Patient Account Number: [id number] MIKA, GRIFFITTS ([id number]) [id number] Page 4 of 9 Date of Birth/Sex: Treating RN: 1956-01-21 (66 y.o. F) Primary Care Wilsie Kern: Cathlean Cower Other Clinician: Referring Nadalyn Deringer: Treating Damonte Frieson/Extender: Hollie Beach in Treatment: 4 Vital Signs Height(in): Pulse(bpm): 32 Weight(lbs): Blood Pressure(mmHg): 146/84 Body Mass  Index(BMI): Temperature(F): 98.7 Respiratory Rate(breaths/min): 17 [2:Photos:] [N/A:N/A] Sacrum N/A N/A Wound Location: Gradually Appeared N/A N/A Wounding Event: MASD N/A N/A Primary Etiology: Anemia, Lymphedema, Sleep Apnea, N/A N/A Comorbid History: Hypertension, Type II Diabetes, Osteoarthritis, Received Radiation 10/19/2005 N/A N/A Date Acquired: 4 N/A N/A Weeks of Treatment: Open N/A N/A Wound Status: No N/A N/A Wound Recurrence: 0.5x0.5x0.1 N/A N/A Measurements L x W x D (cm) 0.196 N/A N/A A (cm) : rea 0.02 N/A N/A Volume (cm) : 93.10% N/A N/A %  Reduction in Area: 96.50% N/A N/A % Reduction in Volume: Full Thickness With Exposed Support N/A N/A Classification: Structures Medium N/A N/A Exudate Amount: Serosanguineous N/A N/A Exudate Type: red, brown N/A N/A Exudate Color: Distinct, outline attached N/A N/A Wound Margin: Large (67-100%) N/A N/A Granulation Amount: Red, Pink N/A N/A Granulation Quality: Small (1-33%) N/A N/A Necrotic Amount: Fat Layer (Subcutaneous Tissue): Yes N/A N/A Exposed Structures: Fascia: No Tendon: No Muscle: No Joint: No Bone: No Large (67-100%) N/A N/A Epithelialization: Excoriation: No N/A N/A Periwound Skin Texture: Induration: No Callus: No Crepitus: No Rash: No Scarring: No Maceration: No N/A N/A Periwound Skin Moisture: Dry/Scaly: No Atrophie Blanche: No N/A N/A Periwound Skin Color: Cyanosis: No Ecchymosis: No Erythema: No Hemosiderin Staining: No Mottled: No Pallor: No Rubor: No No Abnormality N/A N/A Temperature: Yes N/A N/A Tenderness on Palpation: Treatment Notes Wound #2 (Sacrum) Cleanser Anasept Antimicrobial Skin and Wound Cleanser, 8 (oz) Discharge Instruction: Cleanse the wound with Anasept cleanser prior to applying a clean dressing using gauze sponges, not tissue or cotton balls. YANETT, CONKRIGHT ([id number]) [id number] Page 5 of 9 Anasept  Antimicrobial Skin and Wound Cleanser, 8 (oz) Discharge Instruction: Cleanse the wound with Anasept cleanser prior to applying a clean dressing using gauze sponges, not tissue or cotton balls. Peri-Wound Care Gentell AandD+E Ointment, Tube 2 (oz) Topical Primary Dressing KerraCel Ag Gelling Fiber Dressing, 4x5 in (silver alginate) Discharge Instruction: Apply silver alginate to wound bed as instructed Secondary Dressing Woven Gauze Sponge, Non-Sterile 4x4 in Discharge Instruction: Apply over primary dressing as directed. Zetuvit Plus Silicone Border Dressing 7x7(in/in) Discharge Instruction: Apply silicone border over primary dressing as directed. Secured With Compression Wrap Compression Stockings Environmental education officer) Signed: 09/04/2022 10:08:50 AM By: Kalman Shan DO Entered By: Kalman Shan on 09/04/2022 10:05:28 -------------------------------------------------------------------------------- Multi-Disciplinary Care Plan Details Patient Name: Date of Service: [name] V. 09/04/2022 9:15 A M Medical Record Number: [id number] Patient Account Number: [id number] Date of Birth/Sex: Treating RN: 06-Jun-1956 (66 y.o. Helene Shoe, Meta.Reding Primary Care Georgina Krist: Cathlean Cower Other Clinician: Referring Hero Mccathern: Treating Sheryl Saintil/Extender: Hollie Beach in Treatment: 4 Active Inactive Wound/Skin Impairment Nursing Diagnoses: Impaired tissue integrity Knowledge deficit related to ulceration/compromised skin integrity Goals: Patient will have a decrease in wound volume by X% from date: (specify in notes) Date Initiated: 08/03/2022 Target Resolution Date: 08/22/2022 Goal Status: Active Patient/caregiver will verbalize understanding of skin care regimen Date Initiated: 08/03/2022 Target Resolution Date: 08/22/2022 Goal Status: Active Ulcer/skin breakdown will have a volume reduction of 30% by week 4 Date Initiated: 08/03/2022 Target  Resolution Date: 08/22/2022 Goal Status: Active Interventions: Assess patient/caregiver ability to obtain necessary supplies Assess patient/caregiver ability to perform ulcer/skin care regimen upon admission and as needed Assess ulceration(s) every visit Notes: BETTYJEAN, STEFANSKI ([id number]) [id number] Page 6 of 9 Electronic Signature(s) Signed: 09/04/2022 5:21:52 PM By: Deon Pilling RN, BSN Entered By: Deon Pilling on 09/04/2022 09:40:59 -------------------------------------------------------------------------------- Pain Assessment Details Patient Name: Date of Service: [name] V. 09/04/2022 9:15 A M Medical Record Number: [id number] Patient Account Number: [id number] Date of Birth/Sex: Treating RN: Jul 07, 1956 (66 y.o. [name], [name] Elyssa Pendelton: Cathlean Cower Other Clinician: Referring Patriciann Becht: Treating Webber Michiels/Extender: Hollie Beach in Treatment: 4 Active Problems Location of Pain Severity and Description of Pain Patient Has Paino No Site Locations Pain Management and Medication Current Pain Management: Electronic Signature(s) Signed: 09/04/2022 12:09:11 PM By: Rhae Hammock RN Entered By: Rhae Hammock on 09/04/2022 09:18:43 -------------------------------------------------------------------------------- Patient/Caregiver Education  Details Patient Name: Date of Service: [place] [name] 11/17/2023andnbsp9:15 A M Medical Record Number: [id number] Patient Account Number: [id number] Date of Birth/Gender: Treating RN: 07-21-1956 (66 y.o. Helene Shoe, Meta.Reding Primary Care Physician: Cathlean Cower Other Clinician: Referring Physician: Treating Physician/Extender: Hollie Beach in Treatment: 4 Education Assessment Education Provided To: [name] ([id number]) [id number] Page 7 of 9 Patient Education Topics  Provided Wound/Skin Impairment: Handouts: Skin Care Do's and Dont's Methods: Explain/Verbal Responses: Reinforcements needed Electronic Signature(s) Signed: 09/04/2022 5:21:52 PM By: Deon Pilling RN, BSN Entered By: Deon Pilling on 09/04/2022 09:41:09 -------------------------------------------------------------------------------- Wound Assessment Details Patient Name: Date of Service: [name] V. 09/04/2022 9:15 A M Medical Record Number: [id number] Patient Account Number: [id number] Date of Birth/Sex: Treating RN: 23-Nov-1955 (66 y.o. F) Primary Care Brecken Dewoody: Cathlean Cower Other Clinician: Referring Emmalynn Pinkham: Treating Mayre Bury/Extender: Hollie Beach in Treatment: 4 Wound Status Wound Number: 2 Primary MASD Etiology: Wound Location: Sacrum Wound Open Wounding Event: Gradually Appeared Status: Date Acquired: 10/19/2005 Comorbid Anemia, Lymphedema, Sleep Apnea, Hypertension, Type II Weeks Of Treatment: 4 History: Diabetes, Osteoarthritis, Received Radiation Clustered Wound: No Photos Wound Measurements Length: (cm) 0.5 Width: (cm) 0.5 Depth: (cm) 0.1 Area: (cm) 0.196 Volume: (cm) 0.02 % Reduction in Area: 93.1% % Reduction in Volume: 96.5% Epithelialization: Large (67-100%) Tunneling: No Undermining: No Wound Description Classification: Full Thickness With Exposed Suppor Wound Margin: Distinct, outline attached Exudate Amount: Medium Exudate Type: Serosanguineous Exudate Color: red, brown t Structures Foul Odor After Cleansing: No Slough/Fibrino Yes Wound Bed Granulation Amount: Large (67-100%) Exposed Structure Granulation Quality: Red, Pink Fascia Exposed: No Necrotic Amount: Small (1-33%) Fat Layer (Subcutaneous Tissue) Exposed: Yes Tendon Exposed: No Muscle Exposed: No Joint Exposed: No ROMEY, COHEA V ([id number]) [id number] Page 8 of 9 Bone Exposed: No Periwound Skin Texture Texture  Color No Abnormalities Noted: No No Abnormalities Noted: No Callus: No Atrophie Blanche: No Crepitus: No Cyanosis: No Excoriation: No Ecchymosis: No Induration: No Erythema: No Rash: No Hemosiderin Staining: No Scarring: No Mottled: No Pallor: No Moisture Rubor: No No Abnormalities Noted: No Dry / Scaly: No Temperature / Pain Maceration: No Temperature: No Abnormality Tenderness on Palpation: Yes Treatment Notes Wound #2 (Sacrum) Cleanser Anasept Antimicrobial Skin and Wound Cleanser, 8 (oz) Discharge Instruction: Cleanse the wound with Anasept cleanser prior to applying a clean dressing using gauze sponges, not tissue or cotton balls. Anasept Antimicrobial Skin and Wound Cleanser, 8 (oz) Discharge Instruction: Cleanse the wound with Anasept cleanser prior to applying a clean dressing using gauze sponges, not tissue or cotton balls. Peri-Wound Care Gentell AandD+E Ointment, Tube 2 (oz) Topical Primary Dressing KerraCel Ag Gelling Fiber Dressing, 4x5 in (silver alginate) Discharge Instruction: Apply silver alginate to wound bed as instructed Secondary Dressing Woven Gauze Sponge, Non-Sterile 4x4 in Discharge Instruction: Apply over primary dressing as directed. Zetuvit Plus Silicone Border Dressing 7x7(in/in) Discharge Instruction: Apply silicone border over primary dressing as directed. Secured With Compression Wrap Compression Stockings Environmental education officer) Signed: 09/04/2022 12:40:42 PM By: Erenest Blank Entered By: Erenest Blank on 09/04/2022 09:22:55 -------------------------------------------------------------------------------- Vitals Details Patient Name: Date of Service: [name] V. 09/04/2022 9:15 A M Medical Record Number: [id number] Patient Account Number: [id number] Date of Birth/Sex: Treating RN: [birth year] (66 y.o. [name], [name] Mica Ramdass: Cathlean Cower Other Clinician: Referring Elysabeth Aust: Treating  Rogen Porte/Extender: Hollie Beach in Treatment: 4 Vital Signs Time Taken: 09:18 Temperature (F): 98.7 SAPHYRE, CILLO V ([id number]) [id number] Page 9  of 9 Pulse (bpm): 74 Respiratory Rate (breaths/min): 17 Blood Pressure (mmHg): 146/84 Reference Range: 80 - 120 mg / dl Electronic Signature(s) Signed: 09/04/2022 12:09:11 PM By: Rhae Hammock RN Entered By: Rhae Hammock on 09/04/2022 09:18:39

## 2022-09-08 ENCOUNTER — Ambulatory Visit (AMBULATORY_SURGERY_CENTER): Payer: Self-pay

## 2022-09-08 VITALS — Ht <= 58 in | Wt 246.2 lb

## 2022-09-08 DIAGNOSIS — R1013 Epigastric pain: Secondary | ICD-10-CM

## 2022-09-08 DIAGNOSIS — D508 Other iron deficiency anemias: Secondary | ICD-10-CM

## 2022-09-08 DIAGNOSIS — K921 Melena: Secondary | ICD-10-CM

## 2022-09-08 DIAGNOSIS — K529 Noninfective gastroenteritis and colitis, unspecified: Secondary | ICD-10-CM

## 2022-09-08 MED ORDER — NA SULFATE-K SULFATE-MG SULF 17.5-3.13-1.6 GM/177ML PO SOLN: 1.0000 | Freq: Once | ORAL | 0 refills | Status: AC

## 2022-09-08 NOTE — Progress Notes (Signed)
No egg or soy allergy known to patient   No issues known to pt with past sedation with any surgeries or procedures  Patient denies ever being told they had issues or difficulty with intubation   No FH of Malignant Hyperthermia  Pt is not on diet pills  Pt is not on  home 02   Pt is on blood thinners -XARELTO- HOLD FOR ONE DAY  Pt denies issues with constipation   No A fib or A flutter   Have any cardiac testing pending--11/28 echocardiogram   Pt instructed to use Singlecare.com or GoodRx for a price reduction on prep    [facility] END[facility]

## 2022-09-09 ENCOUNTER — Telehealth: Payer: Self-pay | Admitting: *Deleted

## 2022-09-09 NOTE — Telephone Encounter (Addendum)
-----   Message from Benay Pike, MD sent at 09/03/2022  9:36 AM EST ----- Val  Can you call the patient's endocrinologist at [facility]. Dr Denton Lank is her endocrinologist. She says it has been a struggle to get ozempic from them and wondering if we can talk to them.  Thanks,  This RN contacted Dr Denton Lank office at [phone] and was requested to leave a message for call back -  This RN stated pt was seen in this office for her metastatic breast cancer- and states she is wanting to use ozempic for weight control as well as her known diabetes but " having difficulty with endocrinology wanting to prescribe. She is not sure if this is due to concern with her current therapy and or diagnosis in this office.  This RN's name and return call number given.

## 2022-09-11 ENCOUNTER — Other Ambulatory Visit: Payer: Self-pay

## 2022-09-14 ENCOUNTER — Encounter (HOSPITAL_COMMUNITY): Payer: Self-pay | Admitting: Gastroenterology

## 2022-09-15 ENCOUNTER — Ambulatory Visit (HOSPITAL_BASED_OUTPATIENT_CLINIC_OR_DEPARTMENT_OTHER)
Admission: RE | Admit: 2022-09-15 | Discharge: 2022-09-15 | Disposition: A | Discharge status: Home / Self Care | Payer: Medicare Other | Source: Ambulatory Visit | Attending: Cardiology | Admitting: Cardiology

## 2022-09-15 ENCOUNTER — Ambulatory Visit (HOSPITAL_COMMUNITY)
Admission: RE | Admit: 2022-09-15 | Discharge: 2022-09-15 | Disposition: A | Discharge status: Home / Self Care | Payer: Medicare Other | Source: Ambulatory Visit | Attending: Cardiology | Admitting: Cardiology

## 2022-09-15 ENCOUNTER — Encounter (HOSPITAL_COMMUNITY): Payer: Self-pay | Admitting: Cardiology

## 2022-09-15 VITALS — BP 110/70 | HR 64 | Wt 246.2 lb

## 2022-09-15 DIAGNOSIS — E785 Hyperlipidemia, unspecified: Secondary | ICD-10-CM | POA: Insufficient documentation

## 2022-09-15 DIAGNOSIS — Z86718 Personal history of other venous thrombosis and embolism: Secondary | ICD-10-CM | POA: Insufficient documentation

## 2022-09-15 DIAGNOSIS — Z171 Estrogen receptor negative status [ER-]: Secondary | ICD-10-CM

## 2022-09-15 DIAGNOSIS — E1142 Type 2 diabetes mellitus with diabetic polyneuropathy: Secondary | ICD-10-CM | POA: Insufficient documentation

## 2022-09-15 DIAGNOSIS — Z79899 Other long term (current) drug therapy: Secondary | ICD-10-CM | POA: Diagnosis not present

## 2022-09-15 DIAGNOSIS — C50919 Malignant neoplasm of unspecified site of unspecified female breast: Secondary | ICD-10-CM | POA: Insufficient documentation

## 2022-09-15 DIAGNOSIS — Z7901 Long term (current) use of anticoagulants: Secondary | ICD-10-CM | POA: Insufficient documentation

## 2022-09-15 DIAGNOSIS — Z853 Personal history of malignant neoplasm of breast: Secondary | ICD-10-CM | POA: Diagnosis not present

## 2022-09-15 DIAGNOSIS — Z0189 Encounter for other specified special examinations: Secondary | ICD-10-CM

## 2022-09-15 DIAGNOSIS — Z803 Family history of malignant neoplasm of breast: Secondary | ICD-10-CM | POA: Insufficient documentation

## 2022-09-15 DIAGNOSIS — C50112 Malignant neoplasm of central portion of left female breast: Secondary | ICD-10-CM | POA: Diagnosis not present

## 2022-09-15 DIAGNOSIS — I5032 Chronic diastolic (congestive) heart failure: Secondary | ICD-10-CM

## 2022-09-15 DIAGNOSIS — Z801 Family history of malignant neoplasm of trachea, bronchus and lung: Secondary | ICD-10-CM | POA: Insufficient documentation

## 2022-09-15 DIAGNOSIS — Z800 Family history of malignant neoplasm of digestive organs: Secondary | ICD-10-CM | POA: Diagnosis not present

## 2022-09-15 DIAGNOSIS — I429 Cardiomyopathy, unspecified: Secondary | ICD-10-CM | POA: Diagnosis present

## 2022-09-15 DIAGNOSIS — E669 Obesity, unspecified: Secondary | ICD-10-CM | POA: Diagnosis not present

## 2022-09-15 DIAGNOSIS — G5600 Carpal tunnel syndrome, unspecified upper limb: Secondary | ICD-10-CM | POA: Insufficient documentation

## 2022-09-15 DIAGNOSIS — I110 Hypertensive heart disease with heart failure: Secondary | ICD-10-CM | POA: Insufficient documentation

## 2022-09-15 DIAGNOSIS — R2689 Other abnormalities of gait and mobility: Secondary | ICD-10-CM | POA: Diagnosis not present

## 2022-09-15 LAB — ECHOCARDIOGRAM COMPLETE
Area-P 1/2: 3.42 cm2
Calc EF: 53 %
S' Lateral: 3.3 cm
Single Plane A2C EF: 55.4 %
Single Plane A4C EF: 53 %

## 2022-09-15 MED ORDER — POTASSIUM CHLORIDE CRYS ER 20 MEQ PO TBCR: 20.0000 meq | EXTENDED_RELEASE_TABLET | Freq: Two times a day (BID) | ORAL | 3 refills | Status: DC

## 2022-09-15 MED ORDER — FUROSEMIDE 40 MG PO TABS: 40.0000 mg | ORAL_TABLET | Freq: Two times a day (BID) | ORAL | 3 refills | Status: DC

## 2022-09-15 NOTE — Progress Notes (Signed)
  Echocardiogram 2D Echocardiogram has been performed.  Bobbye Charleston 09/15/2022, 9:41 AM

## 2022-09-15 NOTE — Patient Instructions (Addendum)
RedsClip done today.  No Labs done today.   INCREASE Lasix to '40mg'$  (1 tablet) by mouth 2 times daily.   INCREASE Potassium to '20mg'$  (1 tablet) by mouth 2 times daily.   No other medication changes were made. Please continue all current medications as prescribed.  [facility]. They will contact you to schedule an appointment.   Your physician recommends that you schedule a follow-up appointment in: 10 days for a lab only appointment, 1 month with our NP/[facility] Clinic here in our office and in 6 months with an echo prior. Please contact our office in April 2024 to schedule a May 2024 appointment.   If you have any questions or concerns before your next appointment please send [place] a message through [place] or call our office at [phone].    TO LEAVE A MESSAGE FOR THE NURSE SELECT OPTION 2, PLEASE LEAVE A MESSAGE INCLUDING: YOUR NAME DATE OF BIRTH CALL BACK NUMBER REASON FOR CALL**this is important as we prioritize the call backs  YOU WILL RECEIVE A CALL BACK THE SAME DAY AS LONG AS YOU CALL BEFORE 4:00 PM   Do the following things EVERYDAY: Weigh yourself in the morning before breakfast. Write it down and keep it in a log. Take your medicines as prescribed Eat low salt foods--Limit salt (sodium) to 2000 mg per day.  Stay as active as you can everyday Limit all fluids for the day to less than 2 liters   At the [facility], you and your health needs are our priority. As part of our continuing mission to provide you with exceptional heart care, we have created designated Provider Care Teams. These Care Teams include your primary Cardiologist (physician) and Advanced Practice Providers (APPs- Physician Assistants and Nurse Practitioners) who all work together to provide you with the care you need, when you need it.   You may see any of the following providers on your designated Care Team at your next follow up: Dr Glori Bickers Dr  Haynes Kerns, NP Lyda Jester, [place] Audry Riles, PharmD   Please be sure to bring in all your medications bottles to every appointment.

## 2022-09-15 NOTE — Progress Notes (Incomplete)
{  Select Note:[id number]} 

## 2022-09-15 NOTE — Progress Notes (Signed)
Oncology: Dr. Chryl Heck  66 y.o. with history of DM, HTN, hyperlipidemia, endometrial cancer, and breast cancer was referred by Dr. Jana Hakim for cardio-oncology evaluation.  Initial breast cancer diagnosis was in 2012.  She had left breast lumpectomy and radiation.  Recurrence in 7/20, ER+/PR-/HER2+.  She had chemotherapy with trastuzumab/pertuzumab/carboplatin/docetaxol x 6 starting 2/21.  With fall in EF in 4/21, Herceptin was held.  Echo in 4/21 showed EF 45-50%, Coreg was started. Echo in 6/21 showed EF back up to 55-60%, and Herceptin was restarted.  In 6/21, she had lumpectomy.  Echo in 7/21 showed EF stable at 55-60%.    Echo in 9/21 showed EF 55-60% with normal RV, strain images did not track well.  Coronary CTA was done in 9/21, showing coronary artery calcium score 0 and no significant CAD.    PET in 9/21 with new lymphadenopathy.  She was started on TDM-1/Kadcyla but later changed back to Herceptin which will continue for long-term.   In 12/21, she had a cervical discectomy and fusion.   Echo in 2/22 showed EF 55% with GLS -10.7% but poor endocardial tracing. Normal RV.  Echo in 4/22 showed EF 50-55%, mild LVH, normal RV, GLS -14.3% but poor endocardial tracing. Echo in 7/22 showed EF 55-60%, mild LVH, mild LAE, GLS -20.1%, normal RV, PASP 32 mmHg.  Echo in 10/22 showed EF 55-60%, mild LVH, GLS -20.1%, normal RV. Echo in 5/23 showed EF 55-60% with GLS -22.1%, normal RV.   Echo was done today and reviewed, EF 55% on my read (reported 50-55%), GLS -19.8%, normal RV.    Patient returns for followup of diastolic CHF.  She continues to have pain and burning in hands and feet consistent with neuropathy.  She has contractures in her fingers and foot drop.  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  It is hard for her to exercise much due to neuropathy and poor balance.  Weight is up 13 lbs.  She has chronic dyspnea with walking 100 feet or so, cleaning the house, dressing.  No  chest pain.  Dyspnea has worsened with weight gain.    ECG (personally reviewed): NSR, poor RWP  REDS clip 43%   Labs (5/21): K 4.1, creatinine 0.89 Labs (7/21): K 4.2, creatinine 0.95 Labs (8/21): LDL 81, BNP 36 Labs (9/21): K 3.7, creatinine 0.94 Labs (1/22): K 3.2, creatinine 1.14 Labs (4/22): K 4.6, creatinine 1.02 Labs (5/22): negative serum and urine immunofixation Labs (6/22): ESR 87, ANA negative, ANCA negative, CK 225, hgb 10.2, LDL 110, K 3.7, creatinine 1.38 Labs (10/22): K 3.7, creatinine 1.28 Labs (5/23): K 3.9, creatinine 0.96 Labs (9/23): LDL 77 Labs (11/23): K 3.4, creatinine 1.10  PMH: 1. HTN 2. Diabetes 3. Endometrial carcinoma: 8/17 diagnosis, had hysterectomy and BSO.  4. Hyperlipidemia 5. Breast cancer: Initial diagnosis in 2012.  Had left breast lumpectomy and radiation.  Recurrence in 7/20, ER+/PR-/HER2+.  She had chemotherapy with trastuzumab/pertuzumab/carboplatin/docetaxol x 6 starting 2/21.  With fall in EF in 4/21, Herceptin was held but later restarted.  Lumpectomy 6/21.  - Echo (1/21): EF 60-65%, GLS -20%, normal RV.  - Echo (4/21): EF 45-50%, global hypokinesis, GLS -12.6%, mildly decreased RV function.  - Echo (6/21): EF 55-60%, GLS -14.4%, mild LVH, PASP 38, RV normal.  - Echo (7/21): EF 55-60%, mild LVH, GLS -16.9%, RV  Normal - Echo (9/21): EF 18-56%, grade 2 diastolic dysfunction, GLS -11.6% (poor endocardial tracking), normal RV, normal IVC.  - Echo (2/22): EF 55%,  GLS -10.7% (poor endocardial tracking), normal RV.  - Echo (4/22): EF 50-55%, mild LVH, normal RV, GLS -14.3% but poor endocardial tracing. - Echo (7/22): EF 55-60%, mild LVH, mild LAE, GLS -20.1%, normal RV, PASP 32 mmHg.  - Echo (10/22): EF 55-60%, mild LVH, GLS -20.1%, normal RV.  - Echo (5/23): EF 55-60% with GLS -22.1%, normal RV.  - Echo (11/23): EF 55% on my read (reported 50-55%), GLS -19.8%, normal RV. 6. Coronary calcium score scan (8/21): CAC 0 Agatston units.  - Coronary  CTA (9/21): CAC 0 Agatston units.  No significant CAD.  7. DVT: Left leg, 8/21.  8. Cervical discectomy and fusion in 12/21.  9. Peripheral neuropathy 10. Carpal tunnel syndrome  Social History   Socioeconomic History   Marital status: Divorced    Spouse name: Not on file   Number of children: 0   Years of education: Not on file   Highest education level: Not on file  Occupational History   Occupation: Education officer, museum    Employer: [employer]    Comment: retired  Tobacco Use   Smoking status: Never   Smokeless tobacco: Never  Scientific laboratory technician Use: Never used  Substance and Sexual Activity   Alcohol use: Not Currently    Comment: occasional on holidays   Drug use: Never   Sexual activity: Not Currently    Birth control/protection: Post-menopausal, Surgical  Other Topics Concern   Not on file  Social History Narrative   1 son deceased with homicide   Patient has been divorced twice   Right handed   Drinks caffeine   One story    Social Determinants of Health   Financial Resource Strain: Not on file  Food Insecurity: Not on file  Transportation Needs: Unmet Transportation Needs (10/25/2020)   PRAPARE - Hydrologist (Medical): Yes    Lack of Transportation (Non-Medical): Yes  Physical Activity: Not on file  Stress: Not on file  Social Connections: Not on file  Intimate Partner Violence: Not on file   Family History  Problem Relation Age of Onset   Diabetes Mother    Hypertension Mother    Ovarian cancer Mother    Heart disease Father        COPD   Alcohol abuse Father        ETOH dependence   Breast cancer Maternal Aunt        dx in her 19s   Lung cancer Maternal Uncle    Breast cancer Paternal Aunt    Cancer Maternal Grandmother        salivary gland cancer   Colon cancer Neg Hx    Colon polyps Neg Hx    Esophageal cancer Neg Hx    Stomach cancer Neg Hx    Rectal cancer Neg Hx    ROS: All systems reviewed and negative  except as per HPI.   Current Outpatient Medications  Medication Sig Dispense Refill   carvedilol (COREG) 6.25 MG tablet Take 6.25 mg by mouth daily in the afternoon.     gabapentin (NEURONTIN) 300 MG capsule Take 600 mg by mouth as needed.     Multiple Vitamins-Minerals (MULTIVITAMIN WITH MINERALS) tablet Take 1 tablet by mouth daily.     omeprazole (PRILOSEC) 40 MG capsule Take 40 mg by mouth daily as needed (Heartburn).     pravastatin (PRAVACHOL) 40 MG tablet TAKE 1 TABLET BY MOUTH EVERY DAY 30 tablet 11   rivaroxaban (XARELTO)  20 MG TABS tablet Take 1 tablet (20 mg total) by mouth daily with supper. 90 tablet 3   silver sulfADIAZINE (SILVADENE) 1 % cream Apply 1 application topically as needed.     telmisartan-hydrochlorothiazide (MICARDIS HCT) 80-12.5 MG tablet Take 1 tablet by mouth daily. 90 tablet 3   furosemide (LASIX) 40 MG tablet Take 1 tablet (40 mg total) by mouth 2 (two) times daily. 180 tablet 3   potassium chloride SA (KLOR-CON M20) 20 MEQ tablet Take 1 tablet (20 mEq total) by mouth 2 (two) times daily. 90 tablet 3   No current facility-administered medications for this encounter.   Facility-Administered Medications Ordered in Other Encounters  Medication Dose Route Frequency Provider Last Rate Last Admin   cloNIDine (CATAPRES) tablet 0.1 mg  0.1 mg Oral Daily Tanner, Lucianne Lei E., PA-C   0.1 mg at 11/21/19 1742   heparin lock flush 100 unit/mL  500 Units Intracatheter Once Iruku, Praveena, MD       BP 110/70   Pulse 64   Wt 111.7 kg (246 lb 3.2 oz)   LMP 05/18/2016   SpO2 99%   BMI 51.46 kg/m  General: NAD Neck: JVP difficult, ?8-9 cm, no thyromegaly or thyroid nodule.  Lungs: Clear to auscultation bilaterally with normal respiratory effort. CV: Nondisplaced PMI.  Heart regular S1/S2, no S3/S4, no murmur.  No peripheral edema.  No carotid bruit.  Normal pedal pulses.  Abdomen: Soft, nontender, no hepatosplenomegaly, no distention.  Skin: Intact without lesions or rashes.   Neurologic: Alert and oriented x 3.  Psych: Normal affect. Extremities: No clubbing or cyanosis.  HEENT: Normal.   Assessment/Plan: 1. Chemotherapy-related cardiomyopathy: Fall in EF and strain in the setting of Herceptin use.  Echo in 4/21 with EF 45-50% with GLS -12.6%.  Herceptin was held, Coreg started, and echo in 6/21 showed EF up to 55-60% with GLS -14.4% => Herceptin restarted.  Echo in 7/21 with EF 55-60%, GLS more negative at -16.9%.  Echo in 2/22 and in 9/21 showed normal EF but less negative strain.  However, strain images did not appear to track well on either echo.  Echo in 4/22  showed EF 50-55%, again with poor strain tracking.  Echo in 7/22 and in 10/22 showed EF 55-60% with GLS -20.1% and mild LVH. Echo in 5/23 showed EF 55-60% with GLS -22.1%, normal RV.  Echo today was read as 50-55% but looks like 55% to me with GLS -19.8% and normal RV. I think there has been no significant change.  NYHA class III symptoms, weight is up.  Volume difficult to assess on exam but REDS clip significantly elevated.   - Continue Coreg 6.25 mg bid and telmisartan.  - I will have her increase Lasix to 40 mg bid and KCl to 20 mEq bid.  BMET in 10 days.   - She should be safe to continue Herceptin.  With stable echoes and long-term Herceptin use, we can continue to obtain echoes at 6 month intervals.  - With painful peripheral neuropathy and carpal tunnel syndrome as well as LVH with diastolic CHF, I was worried about the possibility of cardiac amyloidosis.  Urine and serum immunofixations negative.  Her insurance refused PYP scan.  However, I think we can hold off on this as the neuropathy seems to be more likely due to cervical myelopathy.  2. HTN: BP controlled.   3. Hyperlipidemia: Keep LDL at least < 100 with diabetes.  - Continue pravastatin.   4. Chest pain: None  recently.  Coronary CTA in 9/21 was normal.  5. DVT: on left.   - Continue Xarelto long-term given DVT in setting of malignancy.  6.  Obesity: Difficult for her to exercise due to neuropathy, poor balance.  - I will refer her to pharmacy clinic for semaglutide.  She has diabetes.     Followup in 6 months with echo with me, see APP in 1 month to reassess volume.   Loralie Champagne 09/15/2022

## 2022-09-16 DIAGNOSIS — M1711 Unilateral primary osteoarthritis, right knee: Secondary | ICD-10-CM | POA: Insufficient documentation

## 2022-09-17 ENCOUNTER — Telehealth: Payer: Self-pay | Admitting: Pharmacist

## 2022-09-17 NOTE — Telephone Encounter (Signed)
Spoke with pt to discuss. She has [employer] which should cover Ozempic or Mounjaro since she has DM as well with most recent A1c 7.1% on 07/10/22. Per most recent PCP note in September, looks like pt may be seeing Dr Denton Lank with endo with plan to consider Mounjaro.  Pt states she was supposed to see endo next week but had to move the appt because of a colonoscopy and can't get in with them until next February now. She wishes to see [place] sooner to start therapy.  Of note, she did previously see PharmD for GLP a year ago in 08/2021, but did not want to start on injectable medication at that time. Still a bit hesitant now as she has some pain in her hands so she's worried about injections, but thinks she could handle a weekly injection.  Appt scheduled with PharmD 12/13 to discuss Ozempic or Mounjaro (preference for [facility], pens would be easier to use if she has dexterity issues since they're already prefilled).

## 2022-09-17 NOTE — Telephone Encounter (Signed)
-----   Message from [place], [place] sent at 09/15/2022 10:35 AM EST ----- Regarding: Semaglutide Hey dr Aundra Dubin wants to refer this patient for semaglutide.

## 2022-09-18 ENCOUNTER — Encounter (HOSPITAL_BASED_OUTPATIENT_CLINIC_OR_DEPARTMENT_OTHER): Payer: Medicare Other | Attending: Internal Medicine | Admitting: Internal Medicine

## 2022-09-18 ENCOUNTER — Encounter: Payer: Self-pay | Admitting: Gastroenterology

## 2022-09-18 DIAGNOSIS — M1990 Unspecified osteoarthritis, unspecified site: Secondary | ICD-10-CM | POA: Insufficient documentation

## 2022-09-18 DIAGNOSIS — Z833 Family history of diabetes mellitus: Secondary | ICD-10-CM | POA: Diagnosis not present

## 2022-09-18 DIAGNOSIS — I10 Essential (primary) hypertension: Secondary | ICD-10-CM | POA: Diagnosis not present

## 2022-09-18 DIAGNOSIS — E11622 Type 2 diabetes mellitus with other skin ulcer: Secondary | ICD-10-CM | POA: Diagnosis present

## 2022-09-18 DIAGNOSIS — L98412 Non-pressure chronic ulcer of buttock with fat layer exposed: Secondary | ICD-10-CM | POA: Diagnosis not present

## 2022-09-18 DIAGNOSIS — E1151 Type 2 diabetes mellitus with diabetic peripheral angiopathy without gangrene: Secondary | ICD-10-CM | POA: Diagnosis not present

## 2022-09-18 NOTE — Progress Notes (Signed)
ELICA, ALMAS ([id number]) [id number] Page 1 of 8 Visit Report for 09/18/2022 Chief Complaint Document Details Patient Name: Date of Service: [name] 09/18/2022 9:00 A M Medical Record Number: [id number] Patient Account Number: [id number] Date of Birth/Sex: Treating RN: October 14, 1956 (66 y.o. F) Primary Care Provider: Cathlean Cower Other Clinician: Referring Provider: Treating Provider/Extender: Hollie Beach in Treatment: 6 Information Obtained from: Patient Chief Complaint 08/03/2022; several year history of waxing and waning to a wound to the intergluteal cleft Electronic Signature(s) Signed: 09/18/2022 11:44:41 AM By: Kalman Shan DO Entered By: Kalman Shan on 09/18/2022 09:46:23 -------------------------------------------------------------------------------- HPI Details Patient Name: Date of Service: [name], Jacqueline Gong V. 09/18/2022 9:00 A M Medical Record Number: [id number] Patient Account Number: [id number] Date of Birth/Sex: Treating RN: 04/17/1956 (66 y.o. F) Primary Care Provider: Cathlean Cower Other Clinician: Referring Provider: Treating Provider/Extender: Hollie Beach in Treatment: 6 History of Present Illness HPI Description: 11/02/2019 ADMISSION This is a 66 year old woman who has a wound on the right posterior Achilles area towards the distal end of the Achilles tendon surgery scars. She tells me that roughly 2-1/2 to 3 weeks ago she was picking at some dry skin in the area pulled it off and that is how the wound opened up. She had an original spontaneous Achilles tendon rupture in the early 1990s that was repaired and then very shortly thereafter it reruptured and she required another surgery. She has not had any problems with this since then. The wound itself currently has episodic sharp pain some swelling. She was put on Augmentin by her primary physician on 10/30/2019  for possible coexistent cellulitis. She used hydrogen peroxide for a while with topical antibiotics. Mostly she has been using topical antibiotics as somebody told her that hydrogen peroxide was not good to use. 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 Surgery was in [place], bilateral total knee replacements. ABIs in our clinic on the right were 1.09 1/21; patient here with a linear wound in the right Achilles area. Her wrap fell down she did not call us to replace it. She has questions about her compression wrap necessity. Finally she asked if the wound could be sutured The patient is under a lot of pressure with initiation of chemotherapy she is going to have a port placed etc. 1/28; linear wound in the right Achilles area in the setting of previous surgery. She is going for a port for chemotherapy for her breast cancer. She has apparently made herself an appointment with orthopedic surgery. I am not sure who she has made an appointment with at this point. 2/12 linear wound in the right Achilles is come in in terms of length. This is in the setting of previous surgery in this area The patient is undergoing chemotherapy for breast cancer and having significant side effects [diarrhea] I am trying not to bring in her in too often as long as her wound is contracted 3/5; linear wound in the right Achilles. Not quite as long however still some measurable depth. She tells [place] that she has had problems with chemotherapy also apparently pseudomembranous colitis. She has not had much attention to paid to the wound. Came in without any  dressing on etc. 4/16; right Achilles. She is completely closed 08/03/2022 [name] is a 66 year old female with a past medical history of left sided breast cancer currently on chemotherapy, morbid obesity and FARREN, LANDA V ([id number]) [id number] Page 2 of 8 currently controlled type 2 diabetes that presents the clinic for a wound to her intergluteal cleft. She states that this is an area that [place] and wanes in healing. She does report sitting for most of the day. She has been using zinc oxide to the area. She currently denies signs of infection. 10/23; patient presents for follow-up. She has been using AandD ointment to the wound bed. She did not receive her supplies from [facility]. She has no issues or complaints today. 11/17; patient missed her last clinic appointment. She states she has been using AandD ointment to the wound bed along with silver alginate. She has no issues or complaints today. 12/1; patient presents for follow-up. Patient states she has been very busy over the holidays and has been unable to use the AandD ointment or silver alginate to the wound area. Electronic Signature(s) Signed: 09/18/2022 11:44:41 AM By: Kalman Shan DO Entered By: Kalman Shan on 09/18/2022 09:48:15 -------------------------------------------------------------------------------- Physical Exam Details Patient Name: Date of Service: [name] V. 09/18/2022 9:00 A M Medical Record Number: [id number] Patient Account Number: [id number] Date of Birth/Sex: Treating RN: [birth year] (66 y.o. F) Primary Care Provider: Cathlean Cower Other Clinician: Referring Provider: Treating Provider/Extender: Hollie Beach in Treatment: 6 Constitutional respirations regular, non-labored and within target range for patient.[name] Psychiatric pleasant and cooperative. Notes T the intergluteal cleft there are 2 small wound limited to skin breakdown. No signs of infection. o Electronic Signature(s) Signed: 09/18/2022 11:44:41  AM By: Kalman Shan DO Entered By: Kalman Shan on 09/18/2022 09:48:51 -------------------------------------------------------------------------------- Physician Orders Details Patient Name: Date of Service: [name], Jacqueline Gong V. 09/18/2022 9:00 A M Medical Record Number: [id number] Patient Account Number: [id number] Date of Birth/Sex: Treating RN: December 07, 1955 (66 y.o. Debby Bud Primary Care Provider: Cathlean Cower Other Clinician: Referring Provider: Treating Provider/Extender: Hollie Beach in Treatment: 6 Verbal / Phone Orders: No Diagnosis Coding ICD-10 Coding Code Description E11.622 Type 2 diabetes mellitus with other skin ulcer L98.412 Non-pressure chronic ulcer of buttock with fat layer exposed Follow-up Appointments ppointment in 2 weeks. - Dr. Heber [place] Return A SWEIS, Lunette Stands ([id number]) [phone].pdf Page 3 of 8 Bathing/ Shower/ Hygiene May shower with protection but do not get wound dressing(s) wet. Off-Loading Turn and reposition every 2 hours Wound Treatment Wound #2 - Sacrum Cleanser: Anasept Antimicrobial Skin and Wound Cleanser, 8 (oz) (DME) (Generic) 1 x Per Day/30 Days Discharge Instructions: Cleanse the wound with Anasept cleanser prior to applying a clean dressing using gauze sponges, not tissue or cotton balls. Peri-Wound Care: Gentell AandD+E Ointment, Tube 2 (oz) 1 x Per Day/30 Days Prim Dressing: KerraCel Ag Gelling Fiber Dressing, 4x5 in (silver alginate) (DME) (Generic) 1 x Per Day/30 Days ary Discharge Instructions: Apply silver alginate to wound bed as instructed Secondary Dressing: Woven Gauze Sponge, Non-Sterile 4x4 in (DME) (Generic) 1 x Per Day/30 Days Discharge Instructions: Apply over primary dressing as directed. Electronic Signature(s) Signed: 09/18/2022 11:44:41 AM By: Kalman Shan DO Signed: 09/18/2022 4:33:25 PM By: Deon Pilling RN, BSN Entered By: Deon Pilling on  09/18/2022 09:51:12 -------------------------------------------------------------------------------- Problem List Details Patient Name: Date of Service: [name], Jacqueline Gong V. 09/18/2022 9:00 A M Medical Record Number: [id number] Patient Account  Number: [id number] Date of Birth/Sex: Treating RN: 12-28-1955 (66 y.o. F) Primary Care Provider: Cathlean Cower Other Clinician: Referring Provider: Treating Provider/Extender: Hollie Beach in Treatment: 6 Active Problems ICD-10 Encounter Code Description Active Date MDM Diagnosis E11.622 Type 2 diabetes mellitus with other skin ulcer 08/03/2022 No Yes L98.412 Non-pressure chronic ulcer of buttock with fat layer exposed 08/03/2022 No Yes Inactive Problems Resolved Problems Electronic Signature(s) Signed: 09/18/2022 11:44:41 AM By: Kalman Shan DO Entered By: Kalman Shan on 09/18/2022 09:44:48 [name] ([id number]) [id number] Page 4 of 8 -------------------------------------------------------------------------------- Progress Note Details Patient Name: Date of Service: [name] V. 09/18/2022 9:00 A M Medical Record Number: [id number] Patient Account Number: [id number] Date of Birth/Sex: Treating RN: [birth year] (66 y.o. F) Primary Care Provider: Cathlean Cower Other Clinician: Referring Provider: Treating Provider/Extender: Hollie Beach in Treatment: 6 Subjective Chief Complaint Information obtained from Patient 08/03/2022; several year history of waxing and waning to a wound to the intergluteal cleft History of Present Illness (HPI) 11/02/2019 ADMISSION This is a 66 year old woman who has a wound on the right posterior Achilles area towards the distal end of the Achilles tendon surgery scars. She tells me that roughly 2-1/2 to 3 weeks ago she was picking at some dry skin in the area pulled it off and that is how the wound opened up.  She had an original spontaneous Achilles tendon rupture in the early 1990s that was repaired and then very shortly thereafter it reruptured and she required another surgery. She has not had any problems with this since then. The wound itself currently has episodic sharp pain some swelling. She was put on Augmentin by her primary physician on 10/30/2019 for possible coexistent cellulitis. She used hydrogen peroxide for a while with topical antibiotics. Mostly she has been using topical antibiotics as somebody told her that hydrogen peroxide was not good to use. 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 Surgery was in [place], bilateral total knee replacements. ABIs in our clinic on the right were 1.09 1/21; patient here with a linear wound in the right Achilles area. Her wrap fell down she did not call us to replace it. She has questions about her compression wrap necessity. Finally she asked if the wound could be sutured The patient is under a lot of pressure with initiation of chemotherapy she is going to have a port placed etc. 1/28; linear wound in the right Achilles area in the setting of previous surgery. She is going for a port for chemotherapy for her breast cancer. She has apparently made herself an appointment with orthopedic surgery. I am not sure who she has made an appointment with at this point. 2/12 linear wound in the right Achilles is come in in terms of length. This is in the setting of previous surgery in this area The patient is undergoing chemotherapy for breast cancer and having significant side effects [diarrhea] I am trying not to bring in her in too often as long as her wound is contracted 3/5;  linear wound in the right Achilles. Not quite as long however still some measurable depth. She tells [place] that she has had problems with chemotherapy also apparently pseudomembranous colitis. She has not  had much attention to paid to the wound. Came in without any dressing on etc. 4/16; right Achilles. She is completely closed 08/03/2022 Ms. Kelsa Jaworowski is a 66 year old female with a past medical history of left sided breast cancer currently on chemotherapy, morbid obesity and currently controlled type 2 diabetes that presents the clinic for a wound to her intergluteal cleft. She states that this is an area that [place] and wanes in healing. She does report sitting for most of the day. She has been using zinc oxide to the area. She currently denies signs of infection. 10/23; patient presents for follow-up. She has been using AandD ointment to the wound bed. She did not receive her supplies from [facility]. She has no issues or complaints today. 11/17; patient missed her last clinic appointment. She states she has been using AandD ointment to the wound bed along with silver alginate. She has no issues or complaints today. 12/1; patient presents for follow-up. Patient states she has been very busy over the holidays and has been unable to use the AandD ointment or silver alginate to the wound area. Patient History Information obtained from Patient. Family History Diabetes - Paternal Grandparents,Mother,Father, Heart Disease - Father, Hypertension - Mother,Father, No family history of Cancer, Hereditary Spherocytosis, Kidney Disease, Lung Disease, Seizures, Stroke, Thyroid Problems, Tuberculosis. 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 Medical History Hematologic/Lymphatic Patient has history of Anemia, Lymphedema Respiratory Patient has history of Sleep Apnea - no CPAP Cardiovascular Patient has  history of Hypertension Endocrine Patient has history of Type II Diabetes Genitourinary Denies history of End Stage Renal Disease Integumentary (Skin) Denies history of History of Burn Musculoskeletal Patient has history of Osteoarthritis Oncologic Patient has history of Received Radiation Denies history of Received Chemotherapy CARSEN, MACHI V ([id number]) [id number] Page 5 of 8 Psychiatric Denies history of Anorexia/bulimia, Confinement Anxiety Hospitalization/Surgery History - bilk knee replacements. - hysterectomy. - left breast lumpectomy. - left achilles repair. - right achilles repair. - c-section. - neck surgery-pinched spinal cord. Medical A Surgical History Notes nd Constitutional Symptoms (General Health) morbid obesity Genitourinary urinary incontinence Oncologic hx left breast CA with recurring metastatic disease, hx uterine CA Objective Constitutional respirations regular, non-labored and within target range for patient.. Vitals Time Taken: 9:24 AM, Temperature: 98.2 F, Pulse: 61 bpm, Respiratory Rate: 18 breaths/min, Blood Pressure: 129/85 mmHg. Psychiatric pleasant and cooperative. General Notes: T the intergluteal cleft there are 2 small wound limited to skin breakdown. No signs of infection. o Integumentary (Hair, Skin) Wound #2 status is Open. Original cause of wound was Gradually Appeared. The date acquired was: 10/19/2005. The wound has been in treatment 6 weeks. The wound is located on the Sacrum. The wound measures 2cm length x 0.2cm width x 0.1cm depth; 0.314cm^2 area and 0.031cm^3 volume. There is Fat Layer (Subcutaneous Tissue) exposed. There is a medium amount of serosanguineous drainage noted. The wound margin is distinct with the outline attached to the wound base. There is large (67-100%) red, pink granulation within the wound bed. There is a small (1-33%) amount of necrotic tissue within the wound bed.  The periwound skin appearance did not exhibit: Callus, Crepitus, Excoriation, Induration, Rash, Scarring, Dry/Scaly, Maceration, Atrophie Blanche, Cyanosis, Ecchymosis, Hemosiderin Staining, Mottled, Pallor, Rubor, Erythema. Periwound temperature was noted as No Abnormality. The periwound has tenderness on palpation. Assessment Active Problems ICD-10 Type 2 diabetes mellitus with other skin ulcer Non-pressure chronic ulcer  of buttock with fat layer exposed Patient has 2 small wounds that appear well-healing. I recommended keeping the area clean and dry and placing AandD ointment to the open areas. Can add silver alginate if needed for moisture control. Follow-up in 2 weeks. Plan Follow-up Appointments: Return Appointment in 2 weeks. - Dr. Heber [place] Bathing/ Shower/ Hygiene: May shower with protection but do not get wound dressing(s) wet. Off-Loading: Turn and reposition every 2 hours WOUND #2: - Sacrum Wound Laterality: Cleanser: Anasept Antimicrobial Skin and Wound Cleanser, 8 (oz) (DME) (Generic) 2 x Per Day/15 Days Discharge Instructions: Cleanse the wound with Anasept cleanser prior to applying a clean dressing using gauze sponges, not tissue or cotton balls. Peri-Wound Care: Gentell AandD+E Ointment, Tube 2 (oz) 2 x Per Day/15 Days Prim Dressing: KerraCel Ag Gelling Fiber Dressing, 4x5 in (silver alginate) (DME) (Generic) 2 x Per Day/15 Days ary Discharge Instructions: Apply silver alginate to wound bed as instructed Secondary Dressing: Woven Gauze Sponge, Non-Sterile 4x4 in (DME) (Generic) 2 x Per Day/15 Days Discharge Instructions: Apply over primary dressing as directed. 1. Silver alginate 2. AandD ointment KRYSTALYNN, RIDGEWAY V ([id number]) [phone].pdf Page 6 of 8 3. Aggressive offloading 4. Anasept wound cleanser 5. Follow-up in 1 week Electronic Signature(s) Signed: 09/18/2022 11:44:41 AM By: Kalman Shan DO Entered By: Kalman Shan on  09/18/2022 09:50:58 -------------------------------------------------------------------------------- HxROS Details Patient Name: Date of Service: [name], Jacqueline Gong V. 09/18/2022 9:00 A M Medical Record Number: [id number] Patient Account Number: [id number] Date of Birth/Sex: Treating RN: 09/07/1956 (66 y.o. F) Primary Care Provider: Cathlean Cower Other Clinician: Referring Provider: Treating Provider/Extender: Hollie Beach in Treatment: 6 Information Obtained From Patient Constitutional Symptoms (General Health) Medical History: Past Medical History Notes: morbid obesity Hematologic/Lymphatic Medical History: Positive for: Anemia; Lymphedema Respiratory Medical History: Positive for: Sleep Apnea - no CPAP Cardiovascular Medical History: Positive for: Hypertension Endocrine Medical History: Positive for: Type II Diabetes Time with diabetes: 5 yrs Treated with: Oral agents Blood sugar tested every day: No Genitourinary Medical History: Negative for: End Stage Renal Disease Past Medical History Notes: urinary incontinence Integumentary (Skin) Medical History: Negative for: History of Burn Musculoskeletal Medical History: Positive for: Osteoarthritis Oncologic MARKEL, MERGENTHALER V ([id number]) [id number] Page 7 of 8 Medical History: Positive for: Received Radiation Negative for: Received Chemotherapy Past Medical History Notes: hx left breast CA with recurring metastatic disease, hx uterine CA Psychiatric Medical History: Negative for: Anorexia/bulimia; Confinement Anxiety Immunizations Pneumococcal Vaccine: Received Pneumococcal Vaccination: Yes Received Pneumococcal Vaccination On or After 60th Birthday: Yes Implantable Devices Yes Hospitalization / Surgery History Type of Hospitalization/Surgery bilk knee replacements hysterectomy left breast lumpectomy left achilles repair right achilles  repair c-section neck surgery-pinched spinal cord Family and Social History Cancer: No; Diabetes: Yes - Paternal Grandparents,Mother,Father; Heart Disease: Yes - Father; Hereditary Spherocytosis: No; Hypertension: Yes - Mother,Father; Kidney Disease: No; Lung Disease: No; Seizures: No; Stroke: No; Thyroid Problems: No; Tuberculosis: No; Never smoker; Marital Status - Divorced; Alcohol Use: Rarely; Drug Use: Prior History - TCH; Caffeine Use: Daily - tea; Financial Concerns: No; Food, Clothing or Shelter Needs: No; Support System Lacking: No; Transportation Concerns: Yes - will have multiple appointments for breast CA Electronic Signature(s) Signed: 09/18/2022 11:44:41 AM By: Kalman Shan DO Entered By: Kalman Shan on 09/18/2022 09:48:21 -------------------------------------------------------------------------------- SuperBill Details Patient Name: Date of Service: [name] 09/18/2022 Medical Record Number: [id number] Patient Account Number: [id number] Date of Birth/Sex: Treating RN: [birth year] (66 y.o. F) Primary Care Provider: Cathlean Cower Other  Clinician: Referring Provider: Treating Provider/Extender: Hollie Beach in Treatment: 6 Diagnosis Coding ICD-10 Codes Code Description E11.622 Type 2 diabetes mellitus with other skin ulcer L98.412 Non-pressure chronic ulcer of buttock with fat layer exposed Facility Procedures : CPT4 Code: [id number] Description: 99213 - WOUND CARE VISIT-LEV 3 EST PT Modifier: Quantity: 1 Physician Procedures : CPT4 Code Description Modifier [id number] 56812 - WC PHYS LEVEL 3 - EST PT ICD-10 Diagnosis Description LATOSHIA, MONRROY ([id number]) [id number] E11.622 Type 2 diabetes mellitus with other skin ulcer L98.412 Non-pressure chronic  ulcer of buttock with fat layer exposed Quantity: 1 1227.pdf Page 8 of 8 Electronic Signature(s) Signed: 09/18/2022 11:44:41 AM By: Kalman Shan  DO Signed: 09/18/2022 4:33:25 PM By: Deon Pilling RN, BSN Entered By: Deon Pilling on 09/18/2022 11:11:36

## 2022-09-18 NOTE — Progress Notes (Signed)
[name] ([id number]) [id number] Page 1 of 8 Visit Report for 09/18/2022 Arrival Information Details Patient Name: Date of Service: [place] [name] 09/18/2022 9:00 A M Medical Record Number: [id number] Patient Account Number: [id number] Date of Birth/Sex: Treating RN: January 27, 1956 (66 y.o. F) Primary Care Zyler Hyson: Cathlean Cower Other Clinician: Referring Nakeem Murnane: Treating Cristopher Ciccarelli/Extender: Hollie Beach in Treatment: 6 Visit Information History Since Last Visit Added or deleted any medications: No Patient Arrived: [name] Any new allergies or adverse reactions: No Arrival Time: 09:23 Had a fall or experienced change in No Accompanied By: self activities of daily living that may affect Transfer Assistance: None risk of falls: Patient Identification Verified: Yes Signs or symptoms of abuse/neglect since last visito No Secondary Verification Process Completed: Yes Hospitalized since last visit: No Patient Requires Transmission-Based Precautions: No Implantable device outside of the clinic excluding No Patient Has Alerts: No cellular tissue based products placed in the center since last visit: Has Dressing in Place as Prescribed: No Pain Present Now: No Notes per patient has not been dressing for x1 week. Electronic Signature(s) Signed: 09/18/2022 4:33:25 PM By: Deon Pilling RN, BSN Entered By: Deon Pilling on 09/18/2022 09:41:33 -------------------------------------------------------------------------------- Clinic Level of Care Assessment Details Patient Name: Date of Service: [place] Jacqueline Messier V. 09/18/2022 9:00 A M Medical Record Number: [id number] Patient Account Number: [id number] Date of Birth/Sex: Treating RN: January 31, 1956 (65 y.o. Debby Bud Primary Care Hatim Homann: Cathlean Cower Other Clinician: Referring Jasiri Hanawalt: Treating Notnamed Croucher/Extender: Hollie Beach in Treatment:  6 Clinic Level of Care Assessment Items TOOL 4 Quantity Score X- 1 0 Use when only an EandM is performed on FOLLOW-UP visit ASSESSMENTS - Nursing Assessment / Reassessment X- 1 10 Reassessment of Co-morbidities (includes updates in patient status) X- 1 5 Reassessment of Adherence to Treatment Plan ASSESSMENTS - Wound and Skin A ssessment / Reassessment X - Simple Wound Assessment / Reassessment - one wound 1 5 '[]'$  - 0 Complex Wound Assessment / Reassessment - multiple wounds X- 1 10 Dermatologic / Skin Assessment (not related to wound area) ASSESSMENTS - Focused Assessment '[]'$  - 0 Circumferential Edema Measurements - multi extremities [name], [name] ([id number]) [id number] Page 2 of 8 '[]'$  - 0 Nutritional Assessment / Counseling / Intervention '[]'$  - 0 Lower Extremity Assessment (monofilament, tuning fork, pulses) '[]'$  - 0 Peripheral Arterial Disease Assessment (using hand held doppler) ASSESSMENTS - Ostomy and/or Continence Assessment and Care '[]'$  - 0 Incontinence Assessment and Management '[]'$  - 0 Ostomy Care Assessment and Management (repouching, etc.) PROCESS - Coordination of Care X - Simple Patient / Family Education for ongoing care 1 15 '[]'$  - 0 Complex (extensive) Patient / Family Education for ongoing care X- 1 10 Staff obtains Programmer, systems, Records, T Results / Process Orders est '[]'$  - 0 Staff telephones HHA, Nursing Homes / Clarify orders / etc '[]'$  - 0 Routine Transfer to another Facility (non-emergent condition) '[]'$  - 0 Routine Hospital Admission (non-emergent condition) '[]'$  - 0 New Admissions / Biomedical engineer / Ordering NPWT Apligraf, etc. , '[]'$  - 0 Emergency Hospital Admission (emergent condition) X- 1 10 Simple Discharge Coordination '[]'$  - 0 Complex (extensive) Discharge Coordination PROCESS - Special Needs '[]'$  - 0 Pediatric / Minor Patient Management '[]'$  - 0 Isolation Patient Management '[]'$  - 0 Hearing / Language / Visual  special needs '[]'$  - 0 Assessment of Community assistance (transportation, D/C planning, etc.) '[]'$  - 0 Additional assistance / Altered mentation '[]'$  - 0 Support Surface(s) Assessment (bed,  cushion, seat, etc.) INTERVENTIONS - Wound Cleansing / Measurement X - Simple Wound Cleansing - one wound 1 5 '[]'$  - 0 Complex Wound Cleansing - multiple wounds X- 1 5 Wound Imaging (photographs - any number of wounds) '[]'$  - 0 Wound Tracing (instead of photographs) X- 1 5 Simple Wound Measurement - one wound '[]'$  - 0 Complex Wound Measurement - multiple wounds INTERVENTIONS - Wound Dressings X - Small Wound Dressing one or multiple wounds 1 10 '[]'$  - 0 Medium Wound Dressing one or multiple wounds '[]'$  - 0 Large Wound Dressing one or multiple wounds '[]'$  - 0 Application of Medications - topical '[]'$  - 0 Application of Medications - injection INTERVENTIONS - Miscellaneous '[]'$  - 0 External ear exam '[]'$  - 0 Specimen Collection (cultures, biopsies, blood, body fluids, etc.) '[]'$  - 0 Specimen(s) / Culture(s) sent or taken to Lab for analysis '[]'$  - 0 Patient Transfer (multiple staff / Harrel Lemon Lift / Similar devices) '[]'$  - 0 Simple Staple / Suture removal (25 or less) '[]'$  - 0 Complex Staple / Suture removal (26 or more) [name], Lizzie V ([id number]) [id number] Page 3 of 8 '[]'$  - 0 Hypo / Hyperglycemic Management (close monitor of Blood Glucose) '[]'$  - 0 Ankle / Brachial Index (ABI) - do not check if billed separately X- 1 5 Vital Signs Has the patient been seen at the hospital within the last three years: Yes Total Score: 95 Level Of Care: New/Established - Level 3 Electronic Signature(s) Signed: 09/18/2022 4:33:25 PM By: Deon Pilling RN, BSN Entered By: Deon Pilling on 09/18/2022 11:11:31 -------------------------------------------------------------------------------- Encounter Discharge Information Details Patient Name: Date of Service: [name], [name] V. 09/18/2022 9:00  A M Medical Record Number: [id number] Patient Account Number: [id number] Date of Birth/Sex: Treating RN: [birth year] (66 y.o. Helene Shoe, Tammi Klippel Primary Care Mekiyah Gladwell: Cathlean Cower Other Clinician: Referring Carlester Kasparek: Treating Kyliana Standen/Extender: Hollie Beach in Treatment: 6 Encounter Discharge Information Items Discharge Condition: Stable Ambulatory Status: Cane Discharge Destination: Home Transportation: Private Auto Accompanied By: self Schedule Follow-up Appointment: Yes Clinical Summary of Care: Electronic Signature(s) Signed: 09/18/2022 4:33:25 PM By: Deon Pilling RN, BSN Entered By: Deon Pilling on 09/18/2022 09:52:17 -------------------------------------------------------------------------------- Lower Extremity Assessment Details Patient Name: Date of Service: [name], [name] V. 09/18/2022 9:00 A M Medical Record Number: [id number] Patient Account Number: [id number] Date of Birth/Sex: Treating RN: June 04, 1956 (66 y.o. F) Primary Care Janaiyah Blackard: Cathlean Cower Other Clinician: Referring Tecumseh Yeagley: Treating Sheena Donegan/Extender: Hollie Beach in Treatment: 6 Electronic Signature(s) Signed: 09/18/2022 1:02:55 PM By: Erenest Blank Entered By: Erenest Blank on 09/18/2022 09:25:09 [name] ([id number]) [id number] Page 4 of 8 -------------------------------------------------------------------------------- Multi Wound Chart Details Patient Name: Date of Service: Gonzella Lex 09/18/2022 9:00 A M Medical Record Number: [id number] Patient Account Number: [id number] Date of Birth/Sex: Treating RN: 01-Jun-1956 (66 y.o. F) Primary Care Ronnetta Currington: Cathlean Cower Other Clinician: Referring Mallory Enriques: Treating Mycal Conde/Extender: Hollie Beach in Treatment: 6 Vital Signs Height(in): Pulse(bpm): 31 Weight(lbs): Blood Pressure(mmHg): 129/85 Body Mass  Index(BMI): Temperature(F): 98.2 Respiratory Rate(breaths/min): 18 [2:Photos:] [N/A:N/A] Sacrum N/A N/A Wound Location: Gradually Appeared N/A N/A Wounding Event: MASD N/A N/A Primary Etiology: Anemia, Lymphedema, Sleep Apnea, N/A N/A Comorbid History: Hypertension, Type II Diabetes, Osteoarthritis, Received Radiation 10/19/2005 N/A N/A Date Acquired: 6 N/A N/A Weeks of Treatment: Open N/A N/A Wound Status: No N/A N/A Wound Recurrence: 2x0.2x0.1 N/A N/A Measurements L x W x D (cm) 0.314 N/A N/A A (cm) : rea 0.031 N/A N/A  Volume (cm) : 88.90% N/A N/A % Reduction in Area: 94.50% N/A N/A % Reduction in Volume: Full Thickness With Exposed Support N/A N/A Classification: Structures Medium N/A N/A Exudate Amount: Serosanguineous N/A N/A Exudate Type: red, brown N/A N/A Exudate Color: Distinct, outline attached N/A N/A Wound Margin: Large (67-100%) N/A N/A Granulation Amount: Red, Pink N/A N/A Granulation Quality: Small (1-33%) N/A N/A Necrotic Amount: Fat Layer (Subcutaneous Tissue): Yes N/A N/A Exposed Structures: Fascia: No Tendon: No Muscle: No Joint: No Bone: No Large (67-100%) N/A N/A Epithelialization: Excoriation: No N/A N/A Periwound Skin Texture: Induration: No Callus: No Crepitus: No Rash: No Scarring: No Maceration: No N/A N/A Periwound Skin Moisture: Dry/Scaly: No Atrophie Blanche: No N/A N/A Periwound Skin Color: Cyanosis: No Ecchymosis: No Erythema: No Hemosiderin Staining: No Mottled: No Pallor: No Rubor: No No Abnormality N/A N/A Temperature: Yes N/A N/A Tenderness on Palpation: Treatment Notes KANISHA, DUBA ([id number]) [id number] Page 5 of 8 Electronic Signature(s) Signed: 09/18/2022 11:44:41 AM By: Kalman Shan DO Entered By: Kalman Shan on 09/18/2022 09:44:54 -------------------------------------------------------------------------------- Multi-Disciplinary Care Plan  Details Patient Name: Date of Service: [name], [name] V. 09/18/2022 9:00 A M Medical Record Number: [id number] Patient Account Number: [id number] Date of Birth/Sex: Treating RN: 09-13-1956 (66 y.o. Helene Shoe, Meta.Reding Primary Care Sam Overbeck: Cathlean Cower Other Clinician: Referring Ollie Delano: Treating Nashira Mcglynn/Extender: Hollie Beach in Treatment: 6 Active Inactive Wound/Skin Impairment Nursing Diagnoses: Impaired tissue integrity Knowledge deficit related to ulceration/compromised skin integrity Goals: Patient will have a decrease in wound volume by X% from date: (specify in notes) Date Initiated: 08/03/2022 Target Resolution Date: 10/16/2022 Goal Status: Active Patient/caregiver will verbalize understanding of skin care regimen Date Initiated: 08/03/2022 Target Resolution Date: 10/16/2022 Goal Status: Active Ulcer/skin breakdown will have a volume reduction of 30% by week 4 Date Initiated: 08/03/2022 Target Resolution Date: 10/16/2022 Goal Status: Active Interventions: Assess patient/caregiver ability to obtain necessary supplies Assess patient/caregiver ability to perform ulcer/skin care regimen upon admission and as needed Assess ulceration(s) every visit Notes: Electronic Signature(s) Signed: 09/18/2022 4:33:25 PM By: Deon Pilling RN, BSN Entered By: Deon Pilling on 09/18/2022 09:47:48 -------------------------------------------------------------------------------- Pain Assessment Details Patient Name: Date of Service: [name] 09/18/2022 9:00 A M Medical Record Number: [id number] Patient Account Number: [id number] Date of Birth/Sex: Treating RN: 08-15-1956 (66 y.o. F) Primary Care Stclair Szymborski: Cathlean Cower Other Clinician: Referring Deepa Barthel: Treating Roda Lauture/Extender: Hollie Beach in Treatment: 6 Active Problems Location of Pain Severity and Description of Pain Patient Has Paino No Site  Locations JOENE, GELDER V ([id number]) [id number] Page 6 of 8 Pain Management and Medication Current Pain Management: Electronic Signature(s) Signed: 09/18/2022 1:02:55 PM By: Erenest Blank Entered By: Erenest Blank on 09/18/2022 09:24:54 -------------------------------------------------------------------------------- Patient/Caregiver Education Details Patient Name: Date of Service: Gonzella Lex 12/1/2023andnbsp9:00 A M Medical Record Number: [id number] Patient Account Number: [id number] Date of Birth/Gender: Treating RN: 04/10/1956 (66 y.o. Debby Bud Primary Care Physician: Cathlean Cower Other Clinician: Referring Physician: Treating Physician/Extender: Hollie Beach in Treatment: 6 Education Assessment Education Provided To: Patient Education Topics Provided Wound/Skin Impairment: Handouts: Skin Care Do's and Dont's Methods: Explain/Verbal Responses: Reinforcements needed Electronic Signature(s) Signed: 09/18/2022 4:33:25 PM By: Deon Pilling RN, BSN Entered By: Deon Pilling on 09/18/2022 09:48:03 -------------------------------------------------------------------------------- Wound Assessment Details Patient Name: Date of Service: [name], [name] V. 09/18/2022 9:00 A M Medical Record Number: [id number] Patient Account Number: [id number] Date of Birth/Sex: Treating RN: 1955/11/21 (66 y.o. F)  Primary Care Natilee Gauer: Cathlean Cower Other Clinician: [name] ([id number]) [id number] Page 7 of 8 Referring Leibish Mcgregor: Treating Malka Bocek/Extender: Hollie Beach in Treatment: 6 Wound Status Wound Number: 2 Primary MASD Etiology: Wound Location: Sacrum Wound Open Wounding Event: Gradually Appeared Status: Date Acquired: 10/19/2005 Comorbid Anemia, Lymphedema, Sleep Apnea, Hypertension, Type II Weeks Of Treatment: 6 History: Diabetes,  Osteoarthritis, Received Radiation Clustered Wound: No Photos Wound Measurements Length: (cm) 2 Width: (cm) 0.2 Depth: (cm) 0.1 Area: (cm) 0.314 Volume: (cm) 0.031 % Reduction in Area: 88.9% % Reduction in Volume: 94.5% Epithelialization: Large (67-100%) Wound Description Classification: Full Thickness With Exposed Support Structures Wound Margin: Distinct, outline attached Exudate Amount: Medium Exudate Type: Serosanguineous Exudate Color: red, brown Foul Odor After Cleansing: No Slough/Fibrino Yes Wound Bed Granulation Amount: Large (67-100%) Exposed Structure Granulation Quality: Red, Pink Fascia Exposed: No Necrotic Amount: Small (1-33%) Fat Layer (Subcutaneous Tissue) Exposed: Yes Tendon Exposed: No Muscle Exposed: No Joint Exposed: No Bone Exposed: No Periwound Skin Texture Texture Color No Abnormalities Noted: No No Abnormalities Noted: No Callus: No Atrophie Blanche: No Crepitus: No Cyanosis: No Excoriation: No Ecchymosis: No Induration: No Erythema: No Rash: No Hemosiderin Staining: No Scarring: No Mottled: No Pallor: No Moisture Rubor: No No Abnormalities Noted: No Dry / Scaly: No Temperature / Pain Maceration: No Temperature: No Abnormality Tenderness on Palpation: Yes Treatment Notes Wound #2 (Sacrum) Cleanser Anasept Antimicrobial Skin and Wound Cleanser, 8 (oz) Discharge Instruction: Cleanse the wound with Anasept cleanser prior to applying a clean dressing using gauze sponges, not tissue or cotton balls. Peri-Wound Care PATRECE, TALLIE V ([id number]) [id number] Page 8 of 8 Gentell AandD+E Ointment, Tube 2 (oz) Topical Primary Dressing KerraCel Ag Gelling Fiber Dressing, 4x5 in (silver alginate) Discharge Instruction: Apply silver alginate to wound bed as instructed Secondary Dressing Woven Gauze Sponge, Non-Sterile 4x4 in Discharge Instruction: Apply over primary dressing as directed. Secured  With Compression Wrap Compression Stockings Environmental education officer) Signed: 09/18/2022 1:02:55 PM By: Erenest Blank Entered By: Erenest Blank on 09/18/2022 09:35:29 -------------------------------------------------------------------------------- Vitals Details Patient Name: Date of Service: [name], [name] V. 09/18/2022 9:00 A M Medical Record Number: [id number] Patient Account Number: [id number] Date of Birth/Sex: Treating RN: 1956-01-21 (66 y.o. F) Primary Care Tamrah Victorino: Cathlean Cower Other Clinician: Referring Khaleelah Yowell: Treating Stanly Si/Extender: Hollie Beach in Treatment: 6 Vital Signs Time Taken: 09:24 Temperature (F): 98.2 Pulse (bpm): 61 Respiratory Rate (breaths/min): 18 Blood Pressure (mmHg): 129/85 Reference Range: 80 - 120 mg / dl Electronic Signature(s) Signed: 09/18/2022 1:02:55 PM By: Erenest Blank Entered By: Erenest Blank on 09/18/2022 09:24:47

## 2022-09-21 ENCOUNTER — Ambulatory Visit (HOSPITAL_COMMUNITY): Payer: Medicare Other | Admitting: Anesthesiology

## 2022-09-21 ENCOUNTER — Encounter (HOSPITAL_COMMUNITY)
Admission: RE | Disposition: A | Discharge status: Home / Self Care | Payer: Self-pay | Source: Home / Self Care | Attending: Gastroenterology

## 2022-09-21 ENCOUNTER — Ambulatory Visit (HOSPITAL_COMMUNITY)
Admission: RE | Admit: 2022-09-21 | Discharge: 2022-09-21 | Disposition: A | Discharge status: Home / Self Care | Payer: Medicare Other | Attending: Gastroenterology | Admitting: Gastroenterology

## 2022-09-21 ENCOUNTER — Other Ambulatory Visit: Payer: Self-pay

## 2022-09-21 ENCOUNTER — Encounter (HOSPITAL_COMMUNITY): Payer: Self-pay | Admitting: Gastroenterology

## 2022-09-21 ENCOUNTER — Ambulatory Visit (HOSPITAL_BASED_OUTPATIENT_CLINIC_OR_DEPARTMENT_OTHER): Payer: Medicare Other | Admitting: Anesthesiology

## 2022-09-21 DIAGNOSIS — K219 Gastro-esophageal reflux disease without esophagitis: Secondary | ICD-10-CM | POA: Insufficient documentation

## 2022-09-21 DIAGNOSIS — C50919 Malignant neoplasm of unspecified site of unspecified female breast: Secondary | ICD-10-CM | POA: Insufficient documentation

## 2022-09-21 DIAGNOSIS — I509 Heart failure, unspecified: Secondary | ICD-10-CM | POA: Diagnosis not present

## 2022-09-21 DIAGNOSIS — K529 Noninfective gastroenteritis and colitis, unspecified: Secondary | ICD-10-CM

## 2022-09-21 DIAGNOSIS — M1990 Unspecified osteoarthritis, unspecified site: Secondary | ICD-10-CM | POA: Insufficient documentation

## 2022-09-21 DIAGNOSIS — I110 Hypertensive heart disease with heart failure: Secondary | ICD-10-CM | POA: Diagnosis not present

## 2022-09-21 DIAGNOSIS — D509 Iron deficiency anemia, unspecified: Secondary | ICD-10-CM

## 2022-09-21 DIAGNOSIS — K2980 Duodenitis without bleeding: Secondary | ICD-10-CM | POA: Diagnosis not present

## 2022-09-21 DIAGNOSIS — Z7901 Long term (current) use of anticoagulants: Secondary | ICD-10-CM | POA: Insufficient documentation

## 2022-09-21 DIAGNOSIS — M797 Fibromyalgia: Secondary | ICD-10-CM | POA: Insufficient documentation

## 2022-09-21 DIAGNOSIS — K5730 Diverticulosis of large intestine without perforation or abscess without bleeding: Secondary | ICD-10-CM

## 2022-09-21 DIAGNOSIS — F418 Other specified anxiety disorders: Secondary | ICD-10-CM | POA: Insufficient documentation

## 2022-09-21 DIAGNOSIS — E119 Type 2 diabetes mellitus without complications: Secondary | ICD-10-CM | POA: Insufficient documentation

## 2022-09-21 DIAGNOSIS — K648 Other hemorrhoids: Secondary | ICD-10-CM | POA: Diagnosis not present

## 2022-09-21 DIAGNOSIS — Z6841 Body Mass Index (BMI) 40.0 and over, adult: Secondary | ICD-10-CM | POA: Insufficient documentation

## 2022-09-21 HISTORY — PX: ESOPHAGOGASTRODUODENOSCOPY (EGD) WITH PROPOFOL: SHX5813

## 2022-09-21 HISTORY — PX: BIOPSY: SHX5522

## 2022-09-21 HISTORY — PX: COLONOSCOPY WITH PROPOFOL: SHX5780

## 2022-09-21 LAB — GLUCOSE, CAPILLARY: Glucose-Capillary: 130 mg/dL — ABNORMAL HIGH (ref 70–99)

## 2022-09-21 SURGERY — COLONOSCOPY WITH PROPOFOL
Anesthesia: Monitor Anesthesia Care

## 2022-09-21 MED ORDER — LIDOCAINE HCL 1 % IJ SOLN
INTRAMUSCULAR | Status: DC | PRN
  Administered 2022-09-21: 50 mg via INTRADERMAL

## 2022-09-21 MED ORDER — PROPOFOL 500 MG/50ML IV EMUL
INTRAVENOUS | Status: DC | PRN
  Administered 2022-09-21: 120 ug/kg/min via INTRAVENOUS

## 2022-09-21 MED ORDER — SODIUM CHLORIDE 0.9 % IV SOLN: INTRAVENOUS | Status: DC

## 2022-09-21 MED ORDER — LACTATED RINGERS IV SOLN: INTRAVENOUS | Status: DC

## 2022-09-21 MED ORDER — PROPOFOL 1000 MG/100ML IV EMUL
INTRAVENOUS | Status: AC
  Filled 2022-09-21: qty 100

## 2022-09-21 MED ORDER — PROPOFOL 10 MG/ML IV BOLUS
INTRAVENOUS | Status: DC | PRN
  Administered 2022-09-21 (×5): 10 mg via INTRAVENOUS

## 2022-09-21 SURGICAL SUPPLY — 25 items

## 2022-09-21 NOTE — Op Note (Signed)
[facility] Patient Name: [name] Procedure Date: 09/21/2022 MRN: [id number] Attending MD: Carlota Raspberry. Havery Moros , MD, [id number] Date of Birth: [birth year] CSN: [id number] Age: 66 Admit Type: Outpatient Procedure:                Upper GI endoscopy Indications:              Iron deficiency anemia, also endorses occasional                            epigastric discomfort, chronic diarrhea Providers:                Carlota Raspberry. Havery Moros, MD, Mikey College, RN,                            Frazier Richards, Technician Referring MD:              Medicines:                Monitored Anesthesia Care Complications:            No immediate complications. Estimated blood loss:                            Minimal. Estimated Blood Loss:     Estimated blood loss was minimal. Procedure:                Pre-Anesthesia Assessment:                           - Prior to the procedure, a History and Physical                            was performed, and patient medications and                            allergies were reviewed. The patient's tolerance of                            previous anesthesia was also reviewed. The risks                            and benefits of the procedure and the sedation                            options and risks were discussed with the patient.                            All questions were answered, and informed consent                            was obtained. Prior Anticoagulants: The patient has                            taken Xarelto (rivaroxaban), last dose was 2 days  prior to procedure. ASA Grade Assessment: III - A                            patient with severe systemic disease. After                            reviewing the risks and benefits, the patient was                            deemed in satisfactory condition to undergo the                            procedure.                           After obtaining  informed consent, the endoscope was                            passed under direct vision. Throughout the                            procedure, the patient's blood pressure, pulse, and                            oxygen saturations were monitored continuously. The                            [id number]-[id number] ([id number]) Olympus endoscope was introduced                            through the mouth, and advanced to the second part                            of duodenum. The upper GI endoscopy was                            accomplished without difficulty. The patient                            tolerated the procedure well. Scope In: Scope Out: Findings:      Esophagogastric landmarks were identified: the Z-line was found at 35       cm, the gastroesophageal junction was found at 35 cm and the upper       extent of the gastric folds was found at 35 cm from the incisors.      The exam of the esophagus was otherwise normal.      The entire examined stomach was normal. Biopsies were taken with a cold       forceps for Helicobacter pylori testing.      The examined duodenum was normal. Biopsies for histology were taken with       a cold forceps for evaluation of celiac disease. Impression:               - Esophagogastric landmarks identified.                           -  Normal esophagus.                           - Normal stomach. Biopsied.                           - Normal examined duodenum. Biopsied.                           No overt cause for symptoms on this exam but will                            await biopsy results with further recommendations. Moderate Sedation:      No moderate sedation, case performed with MAC Recommendation:           - Patient has a contact number available for                            emergencies. The signs and symptoms of potential                            delayed complications were discussed with the                            patient. Return to normal activities  tomorrow.                            Written discharge instructions were provided to the                            patient.                           - Resume previous diet.                           - Continue present medications.                           - Resume Xarelto later today                           - Await pathology results with further                            recommendations Procedure Code(s):        --- Professional ---                           [phone], Esophagogastroduodenoscopy, flexible,                            transoral; with biopsy, single or multiple Diagnosis Code(s):        --- Professional ---                           D50.9, Iron deficiency anemia, unspecified CPT copyright 2022 American  Medical Association. All rights reserved. The codes documented in this report are preliminary and upon coder review may  be revised to meet current compliance requirements. Remo Lipps P. Cady Hafen, MD 09/21/2022 9:58:11 AM This report has been signed electronically. Number of Addenda: 0

## 2022-09-21 NOTE — Discharge Instructions (Signed)
YOU HAD AN ENDOSCOPIC PROCEDURE TODAY: Refer to the procedure report and other information in the discharge instructions given to you for any specific questions about what was found during the examination. If this information does not answer your questions, please call [place] office at [phone] to clarify.  ° °YOU SHOULD EXPECT: Some feelings of bloating in the abdomen. Passage of more gas than usual. Walking can help get rid of the air that was put into your GI tract during the procedure and reduce the bloating. If you had a lower endoscopy (such as a colonoscopy or flexible sigmoidoscopy) you may notice spotting of blood in your stool or on the toilet paper. Some abdominal soreness may be present for a day or two, also. ° °DIET: Your first meal following the procedure should be a light meal and then it is ok to progress to your normal diet. A half-sandwich or bowl of soup is an example of a good first meal. Heavy or fried foods are harder to digest and may make you feel nauseous or bloated. Drink plenty of fluids but you should avoid alcoholic beverages for 24 hours.  ° °ACTIVITY: Your care partner should take you home directly after the procedure. You should plan to take it easy, moving slowly for the rest of the day. You can resume normal activity the day after the procedure however YOU SHOULD NOT DRIVE, use power tools, machinery or perform tasks that involve climbing or major physical exertion for 24 hours (because of the sedation medicines used during the test).  ° °SYMPTOMS TO REPORT IMMEDIATELY: °A gastroenterologist can be reached at any hour. Please call [phone]  for any of the following symptoms:  °Following lower endoscopy (colonoscopy, flexible sigmoidoscopy) °Excessive amounts of blood in the stool  °Significant tenderness, worsening of abdominal pains  °Swelling of the abdomen that is new, acute  °Fever of 100° or higher  °Following upper endoscopy (EGD, EUS, ERCP, esophageal  dilation) °Vomiting of blood or coffee ground material  °New, significant abdominal pain  °New, significant chest pain or pain under the shoulder blades  °Painful or persistently difficult swallowing  °New shortness of breath  °Black, tarry-looking or red, bloody stools ° °FOLLOW UP:  °If any biopsies were taken you will be contacted by phone or by letter within the next 1-3 weeks. Call [phone]  if you have not heard about the biopsies in 3 weeks.  °Please also call with any specific questions about appointments or follow up tests.  °

## 2022-09-21 NOTE — Transfer of Care (Signed)
Immediate Anesthesia Transfer of Care Note  Patient: [name]  Procedure(s) Performed: COLONOSCOPY WITH PROPOFOL ESOPHAGOGASTRODUODENOSCOPY (EGD) WITH PROPOFOL BIOPSY  Patient Location: PACU and Endoscopy Unit  Anesthesia Type:MAC  Level of Consciousness: awake, alert , oriented, and patient cooperative  Airway & Oxygen Therapy: Patient Spontanous Breathing and Patient connected to face mask oxygen  Post-op Assessment: Report given to RN and Post -op Vital signs reviewed and stable  Post vital signs: Reviewed and stable  Last Vitals:  Vitals Value Taken Time  BP    Temp    Pulse 68 09/21/22 0957  Resp 14 09/21/22 0957  SpO2 62 % 09/21/22 0957  Vitals shown include unvalidated device data.  Last Pain: There were no vitals filed for this visit.       Complications: No notable events documented.

## 2022-09-21 NOTE — Anesthesia Preprocedure Evaluation (Addendum)
Anesthesia Evaluation  Patient identified by MRN, date of birth, ID band Patient awake    Reviewed: Allergy & Precautions, NPO status , Patient's Chart, lab work & pertinent test results, reviewed documented beta blocker date and time   History of Anesthesia Complications Negative for: history of anesthetic complications  Airway Mallampati: II  TM Distance: >3 FB Neck ROM: Full    Dental  (+) Missing,    Pulmonary sleep apnea    Pulmonary exam normal        Cardiovascular hypertension, Pt. on home beta blockers and Pt. on medications +CHF and + DVT  Normal cardiovascular exam     Neuro/Psych  Headaches  Anxiety Depression       GI/Hepatic Neg liver ROS,GERD  Controlled,,  Endo/Other  diabetes, Type 2  Morbid obesity  Renal/GU negative Renal ROS  negative genitourinary   Musculoskeletal  (+) Arthritis ,  Fibromyalgia -  Abdominal   Peds  Hematology negative hematology ROS (+)   Anesthesia Other Findings Day of surgery medications reviewed with patient.  Reproductive/Obstetrics negative OB ROS                              Anesthesia Physical Anesthesia Plan  ASA: 3  Anesthesia Plan: MAC   Post-op Pain Management: Minimal or no pain anticipated   Induction:   PONV Risk Score and Plan: Treatment may vary due to age or medical condition and Propofol infusion  Airway Management Planned: Natural Airway and Nasal Cannula  Additional Equipment: None  Intra-op Plan:   Post-operative Plan:   Informed Consent: I have reviewed the patients History and Physical, chart, labs and discussed the procedure including the risks, benefits and alternatives for the proposed anesthesia with the patient or authorized representative who has indicated his/her understanding and acceptance.       Plan Discussed with: CRNA  Anesthesia Plan Comments:         Anesthesia Quick Evaluation

## 2022-09-21 NOTE — Anesthesia Postprocedure Evaluation (Signed)
Anesthesia Post Note  Patient: [name]  Procedure(s) Performed: COLONOSCOPY WITH PROPOFOL ESOPHAGOGASTRODUODENOSCOPY (EGD) WITH PROPOFOL BIOPSY     Patient location during evaluation: PACU Anesthesia Type: MAC Level of consciousness: awake and alert Pain management: pain level controlled Vital Signs Assessment: post-procedure vital signs reviewed and stable Respiratory status: spontaneous breathing, nonlabored ventilation and respiratory function stable Cardiovascular status: blood pressure returned to baseline Postop Assessment: no apparent nausea or vomiting Anesthetic complications: no   No notable events documented.  Last Vitals:  Vitals:   09/21/22 1020 09/21/22 1028  BP: (!) 184/86 (!) 146/70  Pulse: 60 61  Resp: 14 17  Temp:    SpO2: 100% 100%    Last Pain:  Vitals:   09/21/22 1028  TempSrc:   PainSc: 0-No pain                 Marthenia Rolling

## 2022-09-21 NOTE — Op Note (Addendum)
[facility] Patient Name: [name] Procedure Date: 09/21/2022 MRN: [id number] Attending MD: Carlota Raspberry. Havery Moros , MD, [id number] Date of Birth: 1956/07/03 CSN: [id number] Age: 66 Admit Type: Outpatient Procedure:                Colonoscopy Indications:              Chronic diarrhea, Iron deficiency anemia - last                            exam 2013 Providers:                Carlota Raspberry. Havery Moros, MD, Mikey College, RN,                            Frazier Richards, Technician Referring MD:              Medicines:                Monitored Anesthesia Care Complications:            No immediate complications. Estimated blood loss:                            Minimal. Estimated Blood Loss:     Estimated blood loss was minimal. Procedure:                Pre-Anesthesia Assessment:                           - Prior to the procedure, a History and Physical                            was performed, and patient medications and                            allergies were reviewed. The patient's tolerance of                            previous anesthesia was also reviewed. The risks                            and benefits of the procedure and the sedation                            options and risks were discussed with the patient.                            All questions were answered, and informed consent                            was obtained. Prior Anticoagulants: The patient has                            taken Xarelto (rivaroxaban), last dose was 2 days                            prior to  procedure. ASA Grade Assessment: III - A                            patient with severe systemic disease. After                            reviewing the risks and benefits, the patient was                            deemed in satisfactory condition to undergo the                            procedure.                           After obtaining informed consent, the colonoscope                             was passed under direct vision. Throughout the                            procedure, the patient's blood pressure, pulse, and                            oxygen saturations were monitored continuously. The                            CF-HQ190L ([id number]) Olympus colonoscope was                            introduced through the anus and advanced to the the                            terminal ileum, with identification of the                            appendiceal orifice and IC valve. The colonoscopy                            was performed without difficulty. The patient                            tolerated the procedure well. The quality of the                            bowel preparation was excellent. The terminal                            ileum, ileocecal valve, appendiceal orifice, and                            rectum were photographed. Scope In: 9:34:40 AM Scope Out: 9:03:00 AM Scope Withdrawal Time: 0 hours 9 minutes 9 seconds  Total Procedure Duration: 0 hours 12 minutes 7 seconds  Findings:  The perianal and digital rectal examinations were normal.      The terminal ileum appeared normal of what was able to be visualized       (distal ileum only, difficult to intubate).      Multiple small-mouthed diverticula were found in the transverse colon       and left colon.      Internal hemorrhoids were found during retroflexion.      The exam was otherwise without abnormality.      Biopsies for histology were taken with a cold forceps from the right       colon, left colon and transverse colon for evaluation of microscopic       colitis. Impression:               - The examined portion of the ileum was normal.                           - Diverticulosis in the transverse colon and in the                            left colon.                           - Internal hemorrhoids.                           - The examination was otherwise normal.                            - Biopsies were taken with a cold forceps from the                            right colon, left colon and transverse colon for                            evaluation of microscopic colitis.                           No cause for IDA on this exam. Will await biopsy                            results with further recommendations on how to                            manage chronic loose stools. Moderate Sedation:      No moderate sedation, case performed with MAC Recommendation:           - Patient has a contact number available for                            emergencies. The signs and symptoms of potential                            delayed complications were discussed with the  patient. Return to normal activities tomorrow.                            Written discharge instructions were provided to the                            patient.                           - Resume previous diet.                           - Continue present medications.                           - Resume Xarelto today                           - Await pathology results with further                            recommendations.                           - Repeat colonoscopy in 10 years for screening                            purposes Procedure Code(s):        --- Professional ---                           [phone], Colonoscopy, flexible; with biopsy, single                            or multiple Diagnosis Code(s):        --- Professional ---                           K64.8, Other hemorrhoids                           K52.9, Noninfective gastroenteritis and colitis,                            unspecified                           D50.9, Iron deficiency anemia, unspecified                           K57.30, Diverticulosis of large intestine without                            perforation or abscess without bleeding CPT copyright 2022 American Medical Association. All rights reserved. The codes  documented in this report are preliminary and upon coder review may  be revised to meet current compliance requirements. Remo Lipps P. Shamarcus Hoheisel, MD 09/21/2022 9:53:14 AM This report has been signed electronically. Number of Addenda: 0
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
## 2022-09-22 DIAGNOSIS — G5621 Lesion of ulnar nerve, right upper limb: Secondary | ICD-10-CM | POA: Insufficient documentation

## 2022-09-22 DIAGNOSIS — G5622 Lesion of ulnar nerve, left upper limb: Secondary | ICD-10-CM | POA: Insufficient documentation

## 2022-09-22 LAB — SURGICAL PATHOLOGY

## 2022-09-24 ENCOUNTER — Inpatient Hospital Stay: Payer: Medicare Other | Attending: Hematology and Oncology

## 2022-09-24 ENCOUNTER — Inpatient Hospital Stay: Payer: Medicare Other

## 2022-09-24 ENCOUNTER — Other Ambulatory Visit: Payer: Self-pay

## 2022-09-24 ENCOUNTER — Encounter (HOSPITAL_COMMUNITY): Payer: Self-pay | Admitting: Gastroenterology

## 2022-09-24 VITALS — BP 146/78 | HR 62 | Temp 97.8°F | Resp 14 | Wt 248.5 lb

## 2022-09-24 DIAGNOSIS — Z86718 Personal history of other venous thrombosis and embolism: Secondary | ICD-10-CM | POA: Insufficient documentation

## 2022-09-24 DIAGNOSIS — C50812 Malignant neoplasm of overlapping sites of left female breast: Secondary | ICD-10-CM | POA: Diagnosis present

## 2022-09-24 DIAGNOSIS — Z95828 Presence of other vascular implants and grafts: Secondary | ICD-10-CM

## 2022-09-24 DIAGNOSIS — Z171 Estrogen receptor negative status [ER-]: Secondary | ICD-10-CM | POA: Diagnosis not present

## 2022-09-24 DIAGNOSIS — Z79899 Other long term (current) drug therapy: Secondary | ICD-10-CM | POA: Insufficient documentation

## 2022-09-24 DIAGNOSIS — C773 Secondary and unspecified malignant neoplasm of axilla and upper limb lymph nodes: Secondary | ICD-10-CM | POA: Diagnosis present

## 2022-09-24 DIAGNOSIS — Z5112 Encounter for antineoplastic immunotherapy: Secondary | ICD-10-CM | POA: Insufficient documentation

## 2022-09-24 DIAGNOSIS — Z7901 Long term (current) use of anticoagulants: Secondary | ICD-10-CM | POA: Insufficient documentation

## 2022-09-24 LAB — CMP (CANCER CENTER ONLY)
ALT: 14 U/L (ref 0–44)
AST: 19 U/L (ref 15–41)
Albumin: 3.7 g/dL (ref 3.5–5.0)
Alkaline Phosphatase: 47 U/L (ref 38–126)
Anion gap: 6 (ref 5–15)
BUN: 15 mg/dL (ref 8–23)
CO2: 26 mmol/L (ref 22–32)
Calcium: 9.6 mg/dL (ref 8.9–10.3)
Chloride: 110 mmol/L (ref 98–111)
Creatinine: 0.95 mg/dL (ref 0.44–1.00)
GFR, Estimated: >60 mL/min (ref >60)
Glucose, Bld: 112 mg/dL — ABNORMAL HIGH (ref 70–99)
Potassium: 4.1 mmol/L (ref 3.5–5.1)
Sodium: 142 mmol/L (ref 135–145)
Total Bilirubin: 0.4 mg/dL (ref 0.3–1.2)
Total Protein: 7.5 g/dL (ref 6.5–8.1)

## 2022-09-24 LAB — CBC WITH DIFFERENTIAL (CANCER CENTER ONLY)
Abs Immature Granulocytes: 0.01 10*3/uL (ref 0.00–0.07)
Basophils Absolute: 0 10*3/uL (ref 0.0–0.1)
Basophils Relative: 0 %
Eosinophils Absolute: 0.1 10*3/uL (ref 0.0–0.5)
Eosinophils Relative: 2 %
HCT: 31 % — ABNORMAL LOW (ref 36.0–46.0)
Hemoglobin: 9.6 g/dL — ABNORMAL LOW (ref 12.0–15.0)
Immature Granulocytes: 0 %
Lymphocytes Relative: 28 %
Lymphs Abs: 1.3 10*3/uL (ref 0.7–4.0)
MCH: 28.7 pg (ref 26.0–34.0)
MCHC: 31 g/dL (ref 30.0–36.0)
MCV: 92.5 fL (ref 80.0–100.0)
Monocytes Absolute: 0.5 10*3/uL (ref 0.1–1.0)
Monocytes Relative: 10 %
Neutro Abs: 2.7 10*3/uL (ref 1.7–7.7)
Neutrophils Relative %: 60 %
Platelet Count: 188 10*3/uL (ref 150–400)
RBC: 3.35 MIL/uL — ABNORMAL LOW (ref 3.87–5.11)
RDW: 14.1 % (ref 11.5–15.5)
WBC Count: 4.7 10*3/uL (ref 4.0–10.5)
nRBC: 0 % (ref 0.0–0.2)

## 2022-09-24 MED ORDER — SODIUM CHLORIDE 0.9% FLUSH
10.0000 mL | Freq: Once | INTRAVENOUS | Status: AC
  Administered 2022-09-24: 10 mL

## 2022-09-24 MED ORDER — ACETAMINOPHEN 325 MG PO TABS
650.0000 mg | ORAL_TABLET | Freq: Once | ORAL | Status: AC
  Administered 2022-09-24: 650 mg via ORAL
  Filled 2022-09-24: qty 2

## 2022-09-24 MED ORDER — DIPHENHYDRAMINE HCL 25 MG PO CAPS
25.0000 mg | ORAL_CAPSULE | Freq: Once | ORAL | Status: AC
  Administered 2022-09-24: 25 mg via ORAL
  Filled 2022-09-24: qty 1

## 2022-09-24 MED ORDER — TRASTUZUMAB-ANNS CHEMO 150 MG IV SOLR
6.0000 mg/kg | Freq: Once | INTRAVENOUS | Status: AC
  Administered 2022-09-24: 630 mg via INTRAVENOUS
  Filled 2022-09-24: qty 30

## 2022-09-24 MED ORDER — SODIUM CHLORIDE 0.9% FLUSH
10.0000 mL | INTRAVENOUS | Status: DC | PRN
  Administered 2022-09-24: 10 mL

## 2022-09-24 MED ORDER — HEPARIN SOD (PORK) LOCK FLUSH 100 UNIT/ML IV SOLN
500.0000 [IU] | Freq: Once | INTRAVENOUS | Status: AC | PRN
  Administered 2022-09-24: 500 [IU]

## 2022-09-24 MED ORDER — SODIUM CHLORIDE 0.9 % IV SOLN: Freq: Once | INTRAVENOUS | Status: AC

## 2022-09-24 NOTE — Progress Notes (Signed)
Per Dr. Chryl Heck - okay to proceed with current echo results (50-55%) from 11/28. Patient is following up with cardiology and CHF clinic.

## 2022-09-24 NOTE — Patient Instructions (Signed)
[facility]  Discharge Instructions: Thank you for choosing [facility] to provide your oncology and hematology care.   If you have a lab appointment with the [facility], please go directly to the [facility] and check in at the registration area.   Wear comfortable clothing and clothing appropriate for easy access to any Portacath or PICC line.   We strive to give you quality time with your provider. You may need to reschedule your appointment if you arrive late (15 or more minutes).  Arriving late affects you and other patients whose appointments are after yours.  Also, if you miss three or more appointments without notifying the office, you may be dismissed from the clinic at the provider's discretion.      For prescription refill requests, have your pharmacy contact our office and allow 72 hours for refills to be completed.    Today you received the following chemotherapy and/or immunotherapy agents: Kanjinti   To help prevent nausea and vomiting after your treatment, we encourage you to take your nausea medication as directed.  BELOW ARE SYMPTOMS THAT SHOULD BE REPORTED IMMEDIATELY: *FEVER GREATER THAN 100.4 F (38 C) OR HIGHER *CHILLS OR SWEATING *NAUSEA AND VOMITING THAT IS NOT CONTROLLED WITH YOUR NAUSEA MEDICATION *UNUSUAL SHORTNESS OF BREATH *UNUSUAL BRUISING OR BLEEDING *URINARY PROBLEMS (pain or burning when urinating, or frequent urination) *BOWEL PROBLEMS (unusual diarrhea, constipation, pain near the anus) TENDERNESS IN MOUTH AND THROAT WITH OR WITHOUT PRESENCE OF ULCERS (sore throat, sores in mouth, or a toothache) UNUSUAL RASH, SWELLING OR PAIN  UNUSUAL VAGINAL DISCHARGE OR ITCHING   Items with * indicate a potential emergency and should be followed up as soon as possible or go to the Emergency Department if any problems should occur.  Please show the CHEMOTHERAPY ALERT CARD or IMMUNOTHERAPY ALERT CARD at check-in to the  Emergency Department and triage nurse.  Should you have questions after your visit or need to cancel or reschedule your appointment, please contact [facility]  Dept: [phone]  and follow the prompts.  Office hours are 8:00 a.m. to 4:30 p.m. Monday - Friday. Please note that voicemails left after 4:00 p.m. may not be returned until the following business day.  We are closed weekends and major holidays. You have access to a nurse at all times for urgent questions. Please call the main number to the clinic Dept: [phone] and follow the prompts.   For any non-urgent questions, you may also contact your provider using MyChart. We now offer e-Visits for anyone 81 and older to request care online for non-urgent symptoms. For details visit [email or web address].   Also download the MyChart app! Go to the app store, search "MyChart", open the app, select [facility], and log in with your MyChart username and password.  Masks are optional in the cancer centers. If you would like for your care team to wear a mask while they are taking care of you, please let them know. You may have one support person who is at least 64 years old accompany you for your appointments.

## 2022-09-25 ENCOUNTER — Ambulatory Visit (HOSPITAL_COMMUNITY)
Admission: RE | Admit: 2022-09-25 | Discharge: 2022-09-25 | Disposition: A | Discharge status: Home / Self Care | Payer: Medicare Other | Source: Ambulatory Visit | Attending: Internal Medicine | Admitting: Internal Medicine

## 2022-09-25 DIAGNOSIS — I5032 Chronic diastolic (congestive) heart failure: Secondary | ICD-10-CM | POA: Diagnosis present

## 2022-09-25 LAB — BASIC METABOLIC PANEL
Anion gap: 8 (ref 5–15)
BUN: 19 mg/dL (ref 8–23)
CO2: 25 mmol/L (ref 22–32)
Calcium: 9.1 mg/dL (ref 8.9–10.3)
Chloride: 107 mmol/L (ref 98–111)
Creatinine, Ser: 1.07 mg/dL — ABNORMAL HIGH (ref 0.44–1.00)
GFR, Estimated: 58 mL/min — ABNORMAL LOW (ref >60)
Glucose, Bld: 115 mg/dL — ABNORMAL HIGH (ref 70–99)
Potassium: 4 mmol/L (ref 3.5–5.1)
Sodium: 140 mmol/L (ref 135–145)

## 2022-09-29 ENCOUNTER — Encounter: Payer: Self-pay | Admitting: Emergency Medicine

## 2022-09-29 ENCOUNTER — Ambulatory Visit (INDEPENDENT_AMBULATORY_CARE_PROVIDER_SITE_OTHER): Payer: Medicare Other | Admitting: Emergency Medicine

## 2022-09-29 VITALS — BP 124/76 | HR 67 | Temp 98.0°F | Ht <= 58 in | Wt 242.0 lb

## 2022-09-29 DIAGNOSIS — N898 Other specified noninflammatory disorders of vagina: Secondary | ICD-10-CM | POA: Insufficient documentation

## 2022-09-29 DIAGNOSIS — R202 Paresthesia of skin: Secondary | ICD-10-CM | POA: Diagnosis not present

## 2022-09-29 DIAGNOSIS — Z86010 Personal history of colonic polyps: Secondary | ICD-10-CM | POA: Insufficient documentation

## 2022-09-29 DIAGNOSIS — N6459 Other signs and symptoms in breast: Secondary | ICD-10-CM | POA: Insufficient documentation

## 2022-09-29 DIAGNOSIS — K090 Developmental odontogenic cysts: Secondary | ICD-10-CM | POA: Insufficient documentation

## 2022-09-29 DIAGNOSIS — N912 Amenorrhea, unspecified: Secondary | ICD-10-CM | POA: Insufficient documentation
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
## 2022-09-29 NOTE — Patient Instructions (Signed)
Paresthesia Paresthesia is a burning or prickling feeling. This feeling can happen in any part of the body. It often happens in the hands, arms, legs, or feet. Usually, it is not painful. In most cases, the feeling goes away in a short time and is not a sign of a serious problem. If you have paresthesia that lasts a long time, you need to see your doctor. Follow these instructions at home: Nutrition Eat a healthy diet. This includes: Eating foods that are high in fiber. These include beans, whole grains, and fresh fruits and vegetables. Limiting foods that are high in fat and sugar. These include fried or sweet foods.  Alcohol use  Do not drink alcohol if: Your doctor tells you not to drink. You are pregnant, may be pregnant, or are planning to become pregnant. If you drink alcohol: Limit how much you have to: 0-1 drink a day for women. 0-2 drinks a day for men. Know how much alcohol is in your drink. In the [place] one drink equals one 12 oz bottle of beer (355 mL), one 5 oz glass of wine (148 mL), or one 1 oz glass of hard liquor (44 mL). General instructions Take over-the-counter and prescription medicines only as told by your doctor. Do not smoke or use any products that contain nicotine or tobacco. If you need help quitting, ask your doctor. If you have diabetes, work with your doctor to make sure your blood sugar stays in a healthy range. If your feet feel numb: Check for redness, warmth, and swelling every day. Wear padded socks and comfortable shoes. These help protect your feet. Keep all follow-up visits. Contact a doctor if: You have paresthesia that gets worse or does not go away. You lose feeling (have numbness) after an injury. Your burning or prickling feeling gets worse when you walk. You have pain or cramps. You feel dizzy or you faint. You have a rash. Get help right away if: You feel weak or have new weakness in an arm or leg. You have trouble walking or  moving. You have problems speaking, understanding, or seeing. You feel confused. You cannot control when you pee (urinate) or poop (have a bowel movement). These symptoms may be an emergency. Get help right away. Call 911. Do not wait to see if the symptoms will go away. Do not drive yourself to the hospital. Summary Paresthesia is a burning or prickling feeling. It often happens in the hands, arms, legs, or feet. In most cases, the feeling goes away in a short time and is not a sign of a serious problem. If you have paresthesia that lasts a long time, you need to be seen by your doctor. This information is not intended to replace advice given to you by your health care provider. Make sure you discuss any questions you have with your health care provider. Document Revised: 06/16/2021 Document Reviewed: 06/16/2021 Elsevier Patient Education  2023 Elsevier Inc.  

## 2022-09-29 NOTE — Assessment & Plan Note (Signed)
Paresthesia of scalp in patient with history of polyneuropathy on gabapentin.  Possibly medication related. Cancer patient with recent chemotherapy Diabetic with peripheral polyneuropathy. No red flag signs or symptoms.  Slowly getting better. Advised to monitor symptoms and contact the office if clinical picture changes or feels worse during the next several days.

## 2022-09-30 ENCOUNTER — Ambulatory Visit: Payer: Medicare Other | Attending: Cardiovascular Disease | Admitting: Pharmacist

## 2022-09-30 ENCOUNTER — Ambulatory Visit: Payer: 59 | Admitting: Neurology

## 2022-09-30 VITALS — Wt 242.0 lb

## 2022-09-30 DIAGNOSIS — Z6841 Body Mass Index (BMI) 40.0 and over, adult: Secondary | ICD-10-CM | POA: Diagnosis not present

## 2022-09-30 DIAGNOSIS — E119 Type 2 diabetes mellitus without complications: Secondary | ICD-10-CM | POA: Diagnosis not present

## 2022-09-30 DIAGNOSIS — E661 Drug-induced obesity: Secondary | ICD-10-CM | POA: Diagnosis not present

## 2022-09-30 MED ORDER — MOUNJARO 2.5 MG/0.5ML ~~LOC~~ SOAJ: 2.5000 mg | SUBCUTANEOUS | 0 refills | Status: DC
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
## 2022-09-30 NOTE — Patient Instructions (Signed)
Mounjaro Counseling Points This medication reduces your appetite and may make you feel fuller longer.  Stop eating when your body tells you that you are full. This will likely happen sooner than you are used to. Fried/greasy food and sweets may upset your stomach - minimize these as much as possible. Store your medication in the fridge until you are ready to use it. Inject your medication in the fatty tissue of your lower abdominal area (2 inches away from belly button) or upper outer thigh. Rotate injection sites. Common side effects include: nausea, diarrhea/constipation, and heartburn, and are more likely to occur if you overeat. Stop your injection for 7 days prior to surgical procedures requiring anesthesia.  Dosing schedule: - Month 1: Inject Mounjaro 2.'5mg'$  subcutaneously once weekly for 4 weeks - I'll call you in a month to touch base on tolerability and increase the dose  Tips for success: Write down the reasons why you want to lose weight and post it in a place where you'll see it often.  Start small and work your way up. Keep in mind that it takes time to achieve goals, and small steps add up.  Any additional movements help to burn calories. Taking the stairs rather than the elevator and parking at the far end of your parking lot are easy ways to start. Brisk walking for at least 30 minutes 4 or more days of the week is an excellent goal to work toward  Understanding what it means to feel full: Did you know that it can take 15 minutes or more for your brain to receive the message that you've eaten? That means that, if you eat less food, but consume it slower, you may still feel satisfied.  Eating a lot of fruits and vegetables can also help you feel fuller.  Eat off of smaller plates so that moderate portions don't seem too small  Tips for living a healthier life     Building a Healthy and Balanced Diet Make most of your meal vegetables and fruits -  of your plate. Aim for  color and variety, and remember that potatoes don't count as vegetables on the Healthy Eating Plate because of their negative impact on blood sugar.  Go for whole grains -  of your plate. Whole and intact grains--whole wheat, barley, wheat berries, quinoa, oats, brown rice, and foods made with them, such as whole wheat pasta--have a milder effect on blood sugar and insulin than white bread, white rice, and other refined grains.  Protein power -  of your plate. Fish, poultry, beans, and nuts are all healthy, versatile protein sources--they can be mixed into salads, and pair well with vegetables on a plate. Limit red meat, and avoid processed meats such as bacon and sausage.  Healthy plant oils - in moderation. Choose healthy vegetable oils like olive, canola, soy, corn, sunflower, peanut, and others, and avoid partially hydrogenated oils, which contain unhealthy trans fats. Remember that low-fat does not mean "healthy."  Drink water, coffee, or tea. Skip sugary drinks, limit milk and dairy products to one to two servings per day, and limit juice to a small glass per day.  Stay active. The red figure running across the [place] is a reminder that staying active is also important in weight control.  The main message of the Healthy Eating Plate is to focus on diet quality:  The type of carbohydrate in the diet is more important than the amount of carbohydrate in the diet, because  some sources of carbohydrate--like vegetables (other than potatoes), fruits, whole grains, and beans--are healthier than others. The Healthy Eating Plate also advises consumers to avoid sugary beverages, a major source of calories--usually with little nutritional value--in the American diet. The Healthy Eating Plate encourages consumers to use healthy oils, and it does not set a maximum on the percentage of calories people should get each day from healthy sources of fat. In this way, the Healthy  Eating Plate recommends the opposite of the low-fat message promoted for decades by the USDA.  [employer].no  SUGAR  Sugar is a huge problem in the modern day diet. Sugar is a big contributor to heart disease, diabetes, high triglyceride levels, fatty liver disease and obesity. Sugar is hidden in almost all packaged foods/beverages. Added sugar is extra sugar that is added beyond what is naturally found and has no nutritional benefit for your body. The American Heart Association recommends limiting added sugars to no more than 25g for women and 36 grams for men per day. There are many names for sugar including maltose, sucrose (names ending in "ose"), high fructose corn syrup, molasses, cane sugar, corn sweetener, raw sugar, syrup, honey or fruit juice concentrate.   One of the best ways to limit your added sugars is to stop drinking sweetened beverages such as soda, sweet tea, and fruit juice.  There is 65g of added sugars in one 20oz bottle of Coke! That is equal to 7.5 donuts.   Pay attention and read all nutrition facts labels. Below is an examples of a nutrition facts label. The #1 is showing you the total sugars where the # 2 is showing you the added sugars. This one serving has almost the max amount of added sugars per day!   EXERCISE  Exercise is good. We've all heard that. In an ideal world, we would all have time and resources to get plenty of it. When you are active, your heart pumps more efficiently and you will feel better.  Multiple studies show that even walking regularly has benefits that include living a longer life. The American Heart Association recommends 150 minutes per week of exercise (30 minutes per day most days of the week). You can do this in any increment you wish. Nine or more 10-minute walks count. So does an hour-long exercise class. Break the time apart into what will work in your life. Some of the best things you  can do include walking briskly, jogging, cycling or swimming laps. Not everyone is ready to "exercise." Sometimes we need to start with just getting active. Here are some easy ways to be more active throughout the day:  Take the stairs instead of the elevator  Go for a 10-15 minute walk during your lunch break (find a friend to make it more enjoyable)  When shopping, park at the back of the parking lot  If you take public transportation, get off one stop early and walk the extra distance  Pace around while making phone calls  Check with your doctor if you aren't sure what your limitations may be. Always remember to drink plenty of water when doing any type of exercise. Don't feel like a failure if you're not getting the 90-150 minutes per week. If you started by being a couch potato, then just a 10-minute walk each day is a huge improvement. Start with little victories and work your way up.   HEALTHY EATING TIPS  Plan ahead: make a menu of the meals for a week then create a grocery list to go with that menu. Consider meals that easily stretch into a night of leftovers, such as stews or casseroles. Or consider making two of your favorite meal and put one in the freezer for another night. Try a night or two each week that is "meatless" or "no cook" such as salads. When you get home from the grocery store wash and prepare your vegetables and fruits. Then when you need them they are ready to go.   Tips for going to the grocery store:  [place] store or generic brands  Check the weekly ad from your store on-line or in their in-store flyer  Look at the unit price on the shelf tag to compare/contrast the costs of different items  Buy fruits/vegetables in season  Carrots, bananas and apples are low-cost, naturally healthy items  If meats or frozen vegetables are on sale, buy some extras and put in your freezer  Limit buying prepared or "ready to eat" items, even if they are pre-made salads or  fruit snacks  Do not shop when you're hungry  Foods at eye level tend to be more expensive. Look on the high and low shelves for deals.  Consider shopping at the farmer's market for fresh foods in season.  Avoid the cookie and chip aisles (these are expensive, high in calories and low in nutritional value). Shop on the outside of the grocery store.  Healthy food preparations:  If you can't get lean hamburger, be sure to drain the fat when cooking  Steam, saut (in olive oil), grill or bake foods  Experiment with different seasonings to avoid adding salt to your foods. Kosher salt, sea salt and Himalayan salt are all still salt and should be avoided. Try seasoning food with onion, garlic, thyme, rosemary, basil ect. Onion powder or garlic powder is ok. Avoid if it says salt (ie garlic salt).

## 2022-10-02 ENCOUNTER — Ambulatory Visit (HOSPITAL_BASED_OUTPATIENT_CLINIC_OR_DEPARTMENT_OTHER): Payer: Medicare Other | Admitting: Internal Medicine

## 2022-10-07 NOTE — Progress Notes (Signed)
Oncology: Dr. Chryl Heck HF Cardiology: Dr. Aundra Dubin  66 y.o. with history of DM, HTN, hyperlipidemia, endometrial cancer, and breast cancer was referred by Dr. Jana Hakim for cardio-oncology evaluation.  Initial breast cancer diagnosis was in 2012.  She had left breast lumpectomy and radiation.  Recurrence in 7/20, ER+/PR-/HER2+.  She had chemotherapy with trastuzumab/pertuzumab/carboplatin/docetaxol x 6 starting 2/21.  With fall in EF in 4/21, Herceptin was held.  Echo in 4/21 showed EF 45-50%, Coreg was started. Echo in 6/21 showed EF back up to 55-60%, and Herceptin was restarted.  In 6/21, she had lumpectomy.  Echo in 7/21 showed EF stable at 55-60%.    Echo in 9/21 showed EF 55-60% with normal RV, strain images did not track well.  Coronary CTA was done in 9/21, showing coronary artery calcium score 0 and no significant CAD.    PET in 9/21 with new lymphadenopathy.  She was started on TDM-1/Kadcyla but later changed back to Herceptin which will continue for long-term.   In 12/21, she had a cervical discectomy and fusion.   Echo in 2/22 showed EF 55% with GLS -10.7% but poor endocardial tracing. Normal RV.  Echo in 4/22 showed EF 50-55%, mild LVH, normal RV, GLS -14.3% but poor endocardial tracing. Echo in 7/22 showed EF 55-60%, mild LVH, mild LAE, GLS -20.1%, normal RV, PASP 32 mmHg.  Echo in 10/22 showed EF 55-60%, mild LVH, GLS -20.1%, normal RV. Echo in 5/23 showed EF 55-60% with GLS -22.1%, normal RV.   Echo 11/23 showed EF 55% on Dr. Claris Gladden read (reported 50-55%), GLS -19.8%, normal RV.    Today she returns for HF follow up. Overall feeling fatigued. She gets tired with housework. Has SOB with ADLs but does OK walking on flat ground for short distances.  She continues to have pain and burning in hands and feet consistent with neuropathy.  She has contractures in her fingers and foot drop.  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 Denies palpitations, CP, dizziness, or  PND/Orthopnea. Appetite ok. No fever or chills. She does not weigh at home. Has chronic abdominal cramping. She did not increase her lasix as discussed at last visit due to increased urination and frequent MD appts.  ECG (personally reviewed): none ordered today.  Labs (5/21): K 4.1, creatinine 0.89 Labs (7/21): K 4.2, creatinine 0.95 Labs (8/21): LDL 81, BNP 36 Labs (9/21): K 3.7, creatinine 0.94 Labs (1/22): K 3.2, creatinine 1.14 Labs (4/22): K 4.6, creatinine 1.02 Labs (5/22): negative serum and urine immunofixation Labs (6/22): ESR 87, ANA negative, ANCA negative, CK 225, hgb 10.2, LDL 110, K 3.7, creatinine 1.38 Labs (10/22): K 3.7, creatinine 1.28 Labs (5/23): K 3.9, creatinine 0.96 Labs (9/23): LDL 77 Labs (11/23): K 3.4, creatinine 1.10 Labs (12/23): K 3.7, creatinine 0.90  PMH: 1. HTN 2. Diabetes 3. Endometrial carcinoma: 8/17 diagnosis, had hysterectomy and BSO.  4. Hyperlipidemia 5. Breast cancer: Initial diagnosis in 2012.  Had left breast lumpectomy and radiation.  Recurrence in 7/20, ER+/PR-/HER2+.  She had chemotherapy with trastuzumab/pertuzumab/carboplatin/docetaxol x 6 starting 2/21.  With fall in EF in 4/21, Herceptin was held but later restarted.  Lumpectomy 6/21.  - Echo (1/21): EF 60-65%, GLS -20%, normal RV.  - Echo (4/21): EF 45-50%, global hypokinesis, GLS -12.6%, mildly decreased RV function.  - Echo (6/21): EF 55-60%, GLS -14.4%, mild LVH, PASP 38, RV normal.  - Echo (7/21): EF 55-60%, mild LVH, GLS -16.9%, RV  Normal - Echo (9/21): EF 55-60%, grade 2  diastolic dysfunction, GLS -11.6% (poor endocardial tracking), normal RV, normal IVC.  - Echo (2/22): EF 55%, GLS -10.7% (poor endocardial tracking), normal RV.  - Echo (4/22): EF 50-55%, mild LVH, normal RV, GLS -14.3% but poor endocardial tracing. - Echo (7/22): EF 55-60%, mild LVH, mild LAE, GLS -20.1%, normal RV, PASP 32 mmHg.  - Echo (10/22): EF 55-60%, mild LVH, GLS -20.1%, normal RV.  - Echo (5/23): EF  55-60% with GLS -22.1%, normal RV.  - Echo (11/23): EF 55% on my read (reported 50-55%), GLS -19.8%, normal RV. 6. Coronary calcium score scan (8/21): CAC 0 Agatston units.  - Coronary CTA (9/21): CAC 0 Agatston units.  No significant CAD.  7. DVT: Left leg, 8/21.  8. Cervical discectomy and fusion in 12/21.  9. Peripheral neuropathy 10. Carpal tunnel syndrome  Social History   Socioeconomic History   Marital status: Divorced    Spouse name: Not on file   Number of children: 0   Years of education: Not on file   Highest education level: Not on file  Occupational History   Occupation: Education officer, museum    Employer: [employer]    Comment: retired  Tobacco Use   Smoking status: Never   Smokeless tobacco: Never  Scientific laboratory technician Use: Never used  Substance and Sexual Activity   Alcohol use: Not Currently    Comment: occasional on holidays   Drug use: Never   Sexual activity: Not Currently    Birth control/protection: Post-menopausal, Surgical  Other Topics Concern   Not on file  Social History Narrative   1 son deceased with homicide   Patient has been divorced twice   Right handed   Drinks caffeine   One story    Social Determinants of Health   Financial Resource Strain: Not on file  Food Insecurity: Not on file  Transportation Needs: Unmet Transportation Needs (10/25/2020)   PRAPARE - Hydrologist (Medical): Yes    Lack of Transportation (Non-Medical): Yes  Physical Activity: Not on file  Stress: Not on file  Social Connections: Not on file  Intimate Partner Violence: Not on file   Family History  Problem Relation Age of Onset   Diabetes Mother    Hypertension Mother    Ovarian cancer Mother    Heart disease Father        COPD   Alcohol abuse Father        ETOH dependence   Breast cancer Maternal Aunt        dx in her 22s   Lung cancer Maternal Uncle    Breast cancer Paternal Aunt    Cancer Maternal Grandmother         salivary gland cancer   Colon cancer Neg Hx    Colon polyps Neg Hx    Esophageal cancer Neg Hx    Stomach cancer Neg Hx    Rectal cancer Neg Hx    ROS: All systems reviewed and negative except as per HPI.   Current Outpatient Medications  Medication Sig Dispense Refill   acetaminophen (TYLENOL) 500 MG tablet Take 500 mg by mouth every 6 (six) hours as needed for moderate pain.     Ascorbic Acid (VITAMIN C PO) Take 1 tablet by mouth daily.     carvedilol (COREG) 6.25 MG tablet Take 6.25 mg by mouth 2 (two) times daily.     Cholecalciferol (VITAMIN D3 PO) Take 1 capsule by mouth daily.  Cyanocobalamin (B-12 PO) Take 1 capsule by mouth daily.     furosemide (LASIX) 40 MG tablet Take 40 mg by mouth daily.     gabapentin (NEURONTIN) 300 MG capsule Take 600 mg by mouth at bedtime as needed (pain).     Multiple Vitamins-Minerals (MULTIVITAMIN WITH MINERALS) tablet Take 1 tablet by mouth daily.     Na Sulfate-K Sulfate-Mg Sulf 17.5-3.13-1.6 GM/177ML SOLN See admin instructions.     potassium chloride SA (KLOR-CON M20) 20 MEQ tablet Take 1 tablet (20 mEq total) by mouth 2 (two) times daily. 90 tablet 3   pravastatin (PRAVACHOL) 40 MG tablet TAKE 1 TABLET BY MOUTH EVERY DAY 30 tablet 11   rivaroxaban (XARELTO) 20 MG TABS tablet Take 1 tablet (20 mg total) by mouth daily with supper. 90 tablet 3   silver sulfADIAZINE (SILVADENE) 1 % cream Apply 1 application  topically as needed (wound care).     telmisartan-hydrochlorothiazide (MICARDIS HCT) 80-12.5 MG tablet Take 1 tablet by mouth daily. 90 tablet 3   tirzepatide (MOUNJARO) 2.5 MG/0.5ML Pen Inject 2.5 mg into the skin once a week. 2 mL 0   tiZANidine (ZANAFLEX) 4 MG tablet Take 4 mg by mouth at bedtime as needed for muscle spasms.     No current facility-administered medications for this encounter.   Facility-Administered Medications Ordered in Other Encounters  Medication Dose Route Frequency Provider Last Rate Last Admin   cloNIDine  (CATAPRES) tablet 0.1 mg  0.1 mg Oral Daily Tanner, Lucianne Lei E., PA-C   0.1 mg at 11/21/19 1742   heparin lock flush 100 unit/mL  500 Units Intracatheter Once Benay Pike, MD       Wt Readings from Last 3 Encounters:  10/20/22 110.7 kg (244 lb)  10/15/22 111.4 kg (245 lb 11.2 oz)  09/30/22 109.8 kg (242 lb)   BP (!) 122/94   Pulse 66   Wt 110.7 kg (244 lb)   LMP 05/18/2016   SpO2 98%   BMI 51.00 kg/m  Physical Exam General:  NAD. No resp difficulty, walked into clinic with cane HEENT: Normal Neck: Supple. JVP difficult, ? 8-9. Carotids 2+ bilat; no bruits. No lymphadenopathy or thryomegaly appreciated. Cor: PMI nondisplaced. Regular rate & rhythm. No rubs, gallops or murmurs. Lungs: Clear Abdomen: Obese, nontender, nondistended. No hepatosplenomegaly. No bruits or masses. Good bowel sounds. Extremities: No cyanosis, clubbing, rash, 1+ ankle edema Neuro: Alert & oriented x 3, cranial nerves grossly intact. Moves all 4 extremities w/o difficulty. Affect pleasant.  Assessment/Plan: 1. Chemotherapy-related cardiomyopathy: Fall in EF and strain in the setting of Herceptin use.  Echo in 4/21 with EF 45-50% with GLS -12.6%.  Herceptin was held, Coreg started, and echo in 6/21 showed EF up to 55-60% with GLS -14.4% => Herceptin restarted.  Echo in 7/21 with EF 55-60%, GLS more negative at -16.9%.  Echo in 2/22 and in 9/21 showed normal EF but less negative strain.  However, strain images did not appear to track well on either echo.  Echo in 4/22  showed EF 50-55%, again with poor strain tracking.  Echo in 7/22 and in 10/22 showed EF 55-60% with GLS -20.1% and mild LVH. Echo in 5/23 showed EF 55-60% with GLS -22.1%, normal RV.  Echo 11/23 was read as 50-55% but looks like 55% to Dr. Aundra Dubin with GLS -19.8% and normal RV. I think there has been no significant change.  NYHA class III symptoms, and she is volume overloaded on exam. - To make diuretic regimen as simple  as possible, increase Lasix to 60  mg daily and increase KCL to 20 bid (she has difficulty taking bid medications). Will get BMET and BNP at her Onc follow up 11/05/22 - Continue Coreg 6.25 mg bid and telmisartan.  - She should be safe to continue Herceptin.  With stable echoes and long-term Herceptin use, we can continue to obtain echoes at 6 month intervals.  - With painful peripheral neuropathy and carpal tunnel syndrome as well as LVH with diastolic CHF, I was worried about the possibility of cardiac amyloidosis.  Urine and serum immunofixations negative.  Her insurance refused PYP scan.  However, I think we can hold off on this as the neuropathy seems to be more likely due to cervical myelopathy.  2. HTN: BP controlled.   3. Hyperlipidemia: Keep LDL at least < 100 with diabetes.  - Continue pravastatin.   4. Chest pain: None recently.  Coronary CTA in 9/21 was normal.  5. DVT: on left.   - Continue Xarelto long-term given DVT in setting of malignancy.  6. Obesity: Difficult for her to exercise due to neuropathy, poor balance. Body mass index is 51 kg/m. - She is now on tirzepatide.  Follow up in 5 months with Dr. Aundra Dubin + echo, as scheduled.  Maricela Bo [facility] FNP-BC 10/20/2022

## 2022-10-08 ENCOUNTER — Encounter (HOSPITAL_BASED_OUTPATIENT_CLINIC_OR_DEPARTMENT_OTHER): Payer: Medicare Other | Admitting: Internal Medicine

## 2022-10-08 DIAGNOSIS — E11622 Type 2 diabetes mellitus with other skin ulcer: Secondary | ICD-10-CM | POA: Diagnosis not present

## 2022-10-08 DIAGNOSIS — L98412 Non-pressure chronic ulcer of buttock with fat layer exposed: Secondary | ICD-10-CM | POA: Diagnosis not present

## 2022-10-08 NOTE — Progress Notes (Addendum)
Jacqueline, Young ([id number]) [id number] Page 1 of 8 Visit Report for 10/08/2022 Chief Complaint Document Details Patient Name: Date of Service: [name] 10/08/2022 3:00 PM Medical Record Number: [id number] Patient Account Number: [id number] Date of Birth/Sex: Treating RN: February 03, 1956 (66 y.o. F) Primary Care Provider: Cathlean Cower Other Clinician: Referring Provider: Treating Provider/Extender: [name] in Treatment: 9 Information Obtained from: Patient Chief Complaint 08/03/2022; several year history of waxing and waning to a wound to the intergluteal cleft Electronic Signature(s) Signed: 10/08/2022 4:16:51 PM By: Kalman Shan DO Entered By: Kalman Shan on 10/08/2022 16:01:53 -------------------------------------------------------------------------------- HPI Details Patient Name: Date of Service: [name], [name]. 10/08/2022 3:00 PM Medical Record Number: [id number] Patient Account Number: [id number] Date of Birth/Sex: Treating RN: 1956-04-08 (66 y.o. F) Primary Care Provider: Cathlean Cower Other Clinician: Referring Provider: Treating Provider/Extender: [name] in Treatment: 9 History of Present Illness HPI Description: 11/02/2019 ADMISSION This is a 66 year old woman who has a wound on the right posterior Achilles area towards the distal end of the Achilles tendon surgery scars. She tells me that roughly 2-1/2 to 3 weeks ago she was picking at some dry skin in the area pulled it off and that is how the wound opened up. She had an original spontaneous Achilles tendon rupture in the early 1990s that was repaired and then very shortly thereafter it reruptured and she required another surgery. She has not had any problems with this since then. The wound itself currently has episodic sharp pain some swelling. She was put on Augmentin by her primary physician  on 10/30/2019 for possible coexistent cellulitis. She used hydrogen peroxide for a while with topical antibiotics. Mostly she has been using topical antibiotics as somebody told her that hydrogen peroxide was not good to use. 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 Surgery was in [place], bilateral total knee replacements. ABIs in our clinic on the right were 1.09 1/21; patient here with a linear wound in the right Achilles area. Her wrap fell down she did not call us to replace it. She has questions about her compression wrap necessity. Finally she asked if the wound could be sutured The patient is under a lot of pressure with initiation of chemotherapy she is going to have a port placed etc. 1/28; linear wound in the right Achilles area in the setting of previous surgery. She is going for a port for chemotherapy for her breast cancer. She has apparently made herself an appointment with orthopedic surgery. I am not sure who she has made an appointment with at this point. 2/12 linear wound in the right Achilles is come in in terms of length. This is in the setting of previous surgery in this area The patient is undergoing chemotherapy for breast cancer and having significant side effects [diarrhea] I am trying not to bring in her in too often as long as her wound is contracted 3/5; linear wound in the right Achilles. Not quite as long however still some measurable depth. She tells [place] that she has had problems with chemotherapy also apparently pseudomembranous colitis. She has not had much attention to paid to the wound. Came in without any dressing on  etc. 4/16; right Achilles. She is completely closed 08/03/2022 Ms.  [name] is a 66 year old female with a past medical history of left sided breast cancer currently on chemotherapy, morbid obesity and [name], [name] ([id number]) [id number] Page 2 of 8 currently controlled type 2 diabetes that presents the clinic for a wound to her intergluteal cleft. She states that this is an area that [place] and wanes in healing. She does report sitting for most of the day. She has been using zinc oxide to the area. She currently denies signs of infection. 10/23; patient presents for follow-up. She has been using AandD ointment to the wound bed. She did not receive her supplies from [place]. She has no issues or complaints today. 11/17; patient missed her last clinic appointment. She states she has been using AandD ointment to the wound bed along with silver alginate. She has no issues or complaints today. 12/1; patient presents for follow-up. Patient states she has been very busy over the holidays and has been unable to use the AandD ointment or silver alginate to the wound area. 12/21; patient presents for follow-up. She has been using AandD ointment to the wound bed. She states it is tough to tend to the wound every day. Electronic Signature(s) Signed: 10/08/2022 4:16:51 PM By: Kalman Shan DO Entered By: Kalman Shan on 10/08/2022 16:08:01 -------------------------------------------------------------------------------- Physical Exam Details Patient Name: Date of Service: [name]. 10/08/2022 3:00 PM Medical Record Number: [id number] Patient Account Number: [id number] Date of Birth/Sex: Treating RN: [birth year] (66 y.o. F) Primary Care Provider: Cathlean Cower Other Clinician: Referring Provider: Treating Provider/Extender: [name] in Treatment: 9 Constitutional respirations regular, non-labored and within target range for patient.. Cardiovascular 2+ dorsalis pedis/posterior  tibialis pulses. Psychiatric pleasant and cooperative. Notes T the intergluteal cleft there are 2 small wound limited to skin breakdown. No signs of infection. o Electronic Signature(s) Signed: 10/08/2022 4:16:51 PM By: Kalman Shan DO Entered By: Kalman Shan on 10/08/2022 16:08:24 -------------------------------------------------------------------------------- Physician Orders Details Patient Name: Date of Service: [name], [name]. 10/08/2022 3:00 PM Medical Record Number: [id number] Patient Account Number: [id number] Date of Birth/Sex: Treating RN: [birth year] (66 y.o. [name], [name] Provider: Cathlean Cower Other Clinician: Referring Provider: Treating Provider/Extender: [name] in Treatment: 9 Verbal / Phone Orders: No Diagnosis Coding Follow-up Appointments ppointment in 2 weeks. - Dr. Heber [place] [facility] A Other: - Anasept or Vashe wound cleanser can be purchased off of [place]. Off-Loading [name], [name] ([id number]) [id number] Page 3 of 8 Turn and reposition every 2 hours Wound Treatment Wound #2 - Sacrum Cleanser: Anasept Antimicrobial Skin and Wound Cleanser, 8 (oz) 1 x Per Day/30 Days Discharge Instructions: Cleanse the wound with Anasept cleanser prior to applying a clean dressing using gauze sponges, not tissue or cotton balls. Topical: Bacitracin Ointment, 1 (oz) tube 1 x Per Day/30 Days Discharge Instructions: You can buy this over the counter Secondary Dressing: Woven Gauze Sponge, Non-Sterile 4x4 in (Generic) 1 x Per Day/30 Days Discharge Instructions: Apply over primary dressing as directed. Electronic Signature(s) Signed: 10/08/2022 4:16:51 PM By: Kalman Shan DO Entered By: Kalman Shan on 10/08/2022 16:08:31 -------------------------------------------------------------------------------- Problem List Details Patient Name: Date of Service: [name], [name]. 10/08/2022 3:00 PM Medical Record Number: [id number] Patient Account Number: [id number] Date of Birth/Sex: Treating RN: 12/14/1955 (66 y.o. F) Primary Care Provider: Cathlean Cower Other Clinician: Referring Provider: Treating Provider/Extender: [name] in Treatment: 9  Active Problems ICD-10 Encounter Code Description Active Date MDM Diagnosis E11.622 Type 2 diabetes mellitus with other skin ulcer 08/03/2022 No Yes L98.412 Non-pressure chronic ulcer of buttock with fat layer exposed 08/03/2022 No Yes Inactive Problems Resolved Problems Electronic Signature(s) Signed: 10/08/2022 4:16:51 PM By: Kalman Shan DO Entered By: Kalman Shan on 10/08/2022 15:59:57 -------------------------------------------------------------------------------- Progress Note Details Patient Name: Date of Service: [name], [name]. 10/08/2022 3:00 PM Medical Record Number: [id number] Patient Account Number: [id number] Date of Birth/Sex: Treating RN: Apr 20, 1956 (66 y.o. F) Primary Care Provider: Cathlean Cower Other Clinician: Referring Provider: Treating Provider/Extender: [name] in Treatment: [address] [place] Young ([id number]) [id number] Page 4 of 8 Subjective Chief Complaint Information obtained from Patient 08/03/2022; several year history of waxing and waning to a wound to the intergluteal cleft History of Present Illness (HPI) 11/02/2019 ADMISSION This is a 66 year old woman who has a wound on the right posterior Achilles area towards the distal end of the Achilles tendon surgery scars. She tells me that roughly 2-1/2 to 3 weeks ago she was picking at some dry skin in the area pulled it off and that is how the wound opened up. She had an original spontaneous Achilles tendon rupture in the early 1990s that was repaired and then very shortly thereafter it reruptured and she required another surgery.  She has not had any problems with this since then. The wound itself currently has episodic sharp pain some swelling. She was put on Augmentin by her primary physician on 10/30/2019 for possible coexistent cellulitis. She used hydrogen peroxide for a while with topical antibiotics. Mostly she has been using topical antibiotics as somebody told her that hydrogen peroxide was not good to use. 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 Surgery was in [place], bilateral total knee replacements. ABIs in our clinic on the right were 1.09 1/21; patient here with a linear wound in the right Achilles area. Her wrap fell down she did not call us to replace it. She has questions about her compression wrap necessity. Finally she asked if the wound could be sutured The patient is under a lot of pressure with initiation of chemotherapy she is going to have a port placed etc. 1/28; linear wound in the right Achilles area in the setting of previous surgery. She is going for a port for chemotherapy for her breast cancer. She has apparently made herself an appointment with orthopedic surgery. I am not sure who she has made an appointment with at this point. 2/12 linear wound in the right Achilles is come in in terms of length. This is in the setting of previous surgery in this area The patient is undergoing chemotherapy for breast cancer and having significant side effects [diarrhea] I am trying not to bring in her in too often as long as her wound is contracted 3/5; linear wound in the right Achilles. Not quite as long however still some measurable depth. She tells [place] that she has had problems with chemotherapy also apparently  pseudomembranous colitis. She has not had much attention to paid to the wound. Came in without any dressing on etc. 4/16; right Achilles. She is completely closed 08/03/2022 [name] is a 66 year old female with  a past medical history of left sided breast cancer currently on chemotherapy, morbid obesity and currently controlled type 2 diabetes that presents the clinic for a wound to her intergluteal cleft. She states that this is an area that [place] and wanes in healing. She does report sitting for most of the day. She has been using zinc oxide to the area. She currently denies signs of infection. 10/23; patient presents for follow-up. She has been using AandD ointment to the wound bed. She did not receive her supplies from [place]. She has no issues or complaints today. 11/17; patient missed her last clinic appointment. She states she has been using AandD ointment to the wound bed along with silver alginate. She has no issues or complaints today. 12/1; patient presents for follow-up. Patient states she has been very busy over the holidays and has been unable to use the AandD ointment or silver alginate to the wound area. 12/21; patient presents for follow-up. She has been using AandD ointment to the wound bed. She states it is tough to tend to the wound every day. Patient History Information obtained from Patient. Family History Diabetes - Paternal Grandparents,Mother,Father, Heart Disease - Father, Hypertension - Mother,Father, No family history of Cancer, Hereditary Spherocytosis, Kidney Disease, Lung Disease, Seizures, Stroke, Thyroid Problems, Tuberculosis. 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 Medical History Hematologic/Lymphatic Patient has history of Anemia, Lymphedema Respiratory Patient has history of Sleep Apnea - no CPAP Cardiovascular Patient has history of  Hypertension Endocrine Patient has history of Type II Diabetes Genitourinary Denies history of End Stage Renal Disease Integumentary (Skin) Denies history of History of Burn Musculoskeletal Patient has history of Osteoarthritis Oncologic Patient has history of Received Radiation Denies history of Received Chemotherapy Psychiatric Denies history of Anorexia/bulimia, Confinement Anxiety Hospitalization/Surgery History - bilk knee replacements. - hysterectomy. - left breast lumpectomy. - left achilles repair. - right achilles repair. - c-section. - neck surgery-pinched spinal cord. Medical A Surgical History Notes nd Constitutional Symptoms [facility]) [name], [name] ([id number]) [id number] Page 5 of 8 morbid obesity Genitourinary urinary incontinence Oncologic hx left breast CA with recurring metastatic disease, hx uterine CA Objective Constitutional respirations regular, non-labored and within target range for patient.. Vitals Time Taken: 3:08 PM, Temperature: 98.2 F, Pulse: 78 bpm, Respiratory Rate: 18 breaths/min, Blood Pressure: 130/79 mmHg. Cardiovascular 2+ dorsalis pedis/posterior tibialis pulses. Psychiatric pleasant and cooperative. General Notes: T the intergluteal cleft there are 2 small wound limited to skin breakdown. No signs of infection. o Integumentary (Hair, Skin) Wound #2 status is Open. Original cause of wound was Gradually Appeared. The date acquired was: 10/19/2005. The wound has been in treatment 9 weeks. The wound is located on the Sacrum. The wound measures 1cm length x 0.1cm width x 0.1cm depth; 0.079cm^2 area and 0.008cm^3 volume. There is Fat Layer (Subcutaneous Tissue) exposed. There is no tunneling or undermining noted. There is a medium amount of serosanguineous drainage noted. The wound margin is distinct with the outline attached to the wound base. There is large (67-100%) red, pink granulation within the  wound bed. There is a small (1-33%) amount of necrotic tissue within the wound bed. The periwound skin appearance did not exhibit: Callus, Crepitus, Excoriation, Induration, Rash, Scarring, Dry/Scaly, Maceration, Atrophie Blanche, Cyanosis, Ecchymosis, Hemosiderin Staining, Mottled, Pallor, Rubor, Erythema. Periwound temperature was noted as No Abnormality. The periwound has tenderness on palpation. Assessment Active Problems ICD-10 Type 2  diabetes mellitus with other skin ulcer Non-pressure chronic ulcer of buttock with fat layer exposed Patient's wounds are stable. She is having a tough time healing this area due to location. She has not obtained the Anasept spray. I recommended she use this to clean the area and now start using bacitracin ointment. I recommended addressing this area at least daily. Follow-up in 2 weeks. Aggressive offloading. Plan Follow-up Appointments: Return Appointment in 2 weeks. - Dr. Heber [employer] Other: - Anasept or Vashe wound cleanser can be purchased off of [place]. Off-Loading: Turn and reposition every 2 hours WOUND #2: - Sacrum Wound Laterality: Cleanser: Anasept Antimicrobial Skin and Wound Cleanser, 8 (oz) 1 x Per Day/30 Days Discharge Instructions: Cleanse the wound with Anasept cleanser prior to applying a clean dressing using gauze sponges, not tissue or cotton balls. Topical: Bacitracin Ointment, 1 (oz) tube 1 x Per Day/30 Days Discharge Instructions: You can buy this over the counter Secondary Dressing: Woven Gauze Sponge, Non-Sterile 4x4 in (Generic) 1 x Per Day/30 Days Discharge Instructions: Apply over primary dressing as directed. 1. Bacitracin ointment 2. Anasept or Vashe wound cleanser 3. Aggressive offloading 4. Follow-up in 2 weeks [name], [name] ([id number]) [id number] Page 6 of 8 Electronic Signature(s) Signed: 10/08/2022 4:16:51 PM By: Kalman Shan DO Entered By: Kalman Shan on 10/08/2022  16:12:07 -------------------------------------------------------------------------------- HxROS Details Patient Name: Date of Service: [name], [name]. 10/08/2022 3:00 PM Medical Record Number: [id number] Patient Account Number: [id number] Date of Birth/Sex: Treating RN: [birth year] (66 y.o. F) Primary Care Provider: Cathlean Cower Other Clinician: Referring Provider: Treating Provider/Extender: [name] in Treatment: 9 Information Obtained From Patient Constitutional Symptoms (General Health) Medical History: Past Medical History Notes: morbid obesity Hematologic/Lymphatic Medical History: Positive for: Anemia; Lymphedema Respiratory Medical History: Positive for: Sleep Apnea - no CPAP Cardiovascular Medical History: Positive for: Hypertension Endocrine Medical History: Positive for: Type II Diabetes Time with diabetes: 5 yrs Treated with: Oral agents Blood sugar tested every day: No Genitourinary Medical History: Negative for: End Stage Renal Disease Past Medical History Notes: urinary incontinence Integumentary (Skin) Medical History: Negative for: History of Burn Musculoskeletal Medical History: Positive for: Osteoarthritis Oncologic Medical History: Positive for: Received Radiation Negative for: Received Chemotherapy Past Medical History Notes: hx left breast CA with recurring metastatic disease, hx uterine CA [name], [name] ([id number]) [id number] Page 7 of 8 Psychiatric Medical History: Negative for: Anorexia/bulimia; Confinement Anxiety Immunizations Pneumococcal Vaccine: Received Pneumococcal Vaccination: Yes Received Pneumococcal Vaccination On or After 60th Birthday: Yes Implantable Devices Yes Hospitalization / Surgery History Type of Hospitalization/Surgery bilk knee replacements hysterectomy left breast lumpectomy left achilles repair right achilles repair c-section neck  surgery-pinched spinal cord Family and Social History Cancer: No; Diabetes: Yes - Paternal Grandparents,Mother,Father; Heart Disease: Yes - Father; Hereditary Spherocytosis: No; Hypertension: Yes - Mother,Father; Kidney Disease: No; Lung Disease: No; Seizures: No; Stroke: No; Thyroid Problems: No; Tuberculosis: No; Never smoker; Marital Status - Divorced; Alcohol Use: Rarely; Drug Use: Prior History - TCH; Caffeine Use: Daily - tea; Financial Concerns: No; Food, Clothing or Shelter Needs: No; Support System Lacking: No; Transportation Concerns: Yes - will have multiple appointments for breast CA Electronic Signature(s) Signed: 10/08/2022 4:16:51 PM By: Kalman Shan DO Entered By: Kalman Shan on 10/08/2022 16:08:09 -------------------------------------------------------------------------------- SuperBill Details Patient Name: Date of Service: [name] 10/08/2022 Medical Record Number: [id number] Patient Account Number: [id number] Date of Birth/Sex: Treating RN: 1956-02-09 (66 y.o. F) Primary Care Provider: Cathlean Cower Other Clinician: Referring Provider: Treating  Provider/Extender: [name] in Treatment: 9 Diagnosis Coding ICD-10 Codes Code Description E11.622 Type 2 diabetes mellitus with other skin ulcer L98.412 Non-pressure chronic ulcer of buttock with fat layer exposed Facility Procedures : CPT4 Code: [id number] Description: 99213 - WOUND CARE VISIT-LEV 3 EST PT Modifier: Quantity: 1 Physician Procedures : CPT4 Code Description Modifier [id number] - WC PHYS LEVEL 3 - EST PT ICD-10 Diagnosis Description E11.622 Type 2 diabetes mellitus with other skin ulcer L98.412 Non-pressure chronic ulcer of buttock with fat layer exposed Quantity: 1 Electronic Signature(s) FALON, HUESCA Young ([id number]) [phone].pdf Page 8 of 8 Signed: 10/13/2022 1:40:30 PM By: Deon Pilling RN, BSN Signed: 10/13/2022 3:18:07  PM By: Kalman Shan DO Previous Signature: 10/08/2022 4:16:51 PM Version By: Kalman Shan DO Entered By: Deon Pilling on 10/13/2022 13:40:30

## 2022-10-15 ENCOUNTER — Other Ambulatory Visit: Payer: Self-pay

## 2022-10-15 ENCOUNTER — Encounter: Payer: Self-pay | Admitting: Adult Health

## 2022-10-15 ENCOUNTER — Inpatient Hospital Stay: Payer: Medicare Other

## 2022-10-15 ENCOUNTER — Telehealth: Payer: Self-pay

## 2022-10-15 ENCOUNTER — Other Ambulatory Visit: Payer: Self-pay | Admitting: *Deleted

## 2022-10-15 ENCOUNTER — Inpatient Hospital Stay (HOSPITAL_BASED_OUTPATIENT_CLINIC_OR_DEPARTMENT_OTHER): Payer: Medicare Other | Admitting: Adult Health

## 2022-10-15 VITALS — BP 124/97 | HR 75 | Temp 97.7°F | Resp 18 | Ht <= 58 in | Wt 245.7 lb

## 2022-10-15 DIAGNOSIS — Z5112 Encounter for antineoplastic immunotherapy: Secondary | ICD-10-CM | POA: Diagnosis not present

## 2022-10-15 DIAGNOSIS — Z171 Estrogen receptor negative status [ER-]: Secondary | ICD-10-CM

## 2022-10-15 DIAGNOSIS — C50812 Malignant neoplasm of overlapping sites of left female breast: Secondary | ICD-10-CM

## 2022-10-15 DIAGNOSIS — C50112 Malignant neoplasm of central portion of left female breast: Secondary | ICD-10-CM | POA: Diagnosis not present

## 2022-10-15 DIAGNOSIS — E1142 Type 2 diabetes mellitus with diabetic polyneuropathy: Secondary | ICD-10-CM

## 2022-10-15 LAB — CBC WITH DIFFERENTIAL (CANCER CENTER ONLY)
Abs Immature Granulocytes: 0.01 10*3/uL (ref 0.00–0.07)
Basophils Absolute: 0 10*3/uL (ref 0.0–0.1)
Basophils Relative: 0 %
Eosinophils Absolute: 0.1 10*3/uL (ref 0.0–0.5)
Eosinophils Relative: 1 %
HCT: 31 % — ABNORMAL LOW (ref 36.0–46.0)
Hemoglobin: 10 g/dL — ABNORMAL LOW (ref 12.0–15.0)
Immature Granulocytes: 0 %
Lymphocytes Relative: 26 %
Lymphs Abs: 1.6 10*3/uL (ref 0.7–4.0)
MCH: 28.9 pg (ref 26.0–34.0)
MCHC: 32.3 g/dL (ref 30.0–36.0)
MCV: 89.6 fL (ref 80.0–100.0)
Monocytes Absolute: 0.7 10*3/uL (ref 0.1–1.0)
Monocytes Relative: 11 %
Neutro Abs: 3.8 10*3/uL (ref 1.7–7.7)
Neutrophils Relative %: 62 %
Platelet Count: 211 10*3/uL (ref 150–400)
RBC: 3.46 MIL/uL — ABNORMAL LOW (ref 3.87–5.11)
RDW: 13.8 % (ref 11.5–15.5)
WBC Count: 6.2 10*3/uL (ref 4.0–10.5)
nRBC: 0 % (ref 0.0–0.2)

## 2022-10-15 LAB — CMP (CANCER CENTER ONLY)
ALT: 15 U/L (ref 0–44)
AST: 19 U/L (ref 15–41)
Albumin: 3.7 g/dL (ref 3.5–5.0)
Alkaline Phosphatase: 46 U/L (ref 38–126)
Anion gap: 7 (ref 5–15)
BUN: 23 mg/dL (ref 8–23)
CO2: 28 mmol/L (ref 22–32)
Calcium: 9.3 mg/dL (ref 8.9–10.3)
Chloride: 105 mmol/L (ref 98–111)
Creatinine: 0.9 mg/dL (ref 0.44–1.00)
GFR, Estimated: >60 mL/min (ref >60)
Glucose, Bld: 108 mg/dL — ABNORMAL HIGH (ref 70–99)
Potassium: 3.7 mmol/L (ref 3.5–5.1)
Sodium: 140 mmol/L (ref 135–145)
Total Bilirubin: 0.3 mg/dL (ref 0.3–1.2)
Total Protein: 8 g/dL (ref 6.5–8.1)

## 2022-10-15 LAB — MAGNESIUM: Magnesium: 1.4 mg/dL — ABNORMAL LOW (ref 1.7–2.4)

## 2022-10-15 MED ORDER — DIPHENHYDRAMINE HCL 25 MG PO CAPS
25.0000 mg | ORAL_CAPSULE | Freq: Once | ORAL | Status: AC
  Administered 2022-10-15: 25 mg via ORAL
  Filled 2022-10-15: qty 1

## 2022-10-15 MED ORDER — HEPARIN SOD (PORK) LOCK FLUSH 100 UNIT/ML IV SOLN
500.0000 [IU] | Freq: Once | INTRAVENOUS | Status: AC | PRN
  Administered 2022-10-15: 500 [IU]

## 2022-10-15 MED ORDER — TRASTUZUMAB-ANNS CHEMO 150 MG IV SOLR
6.0000 mg/kg | Freq: Once | INTRAVENOUS | Status: AC
  Administered 2022-10-15: 630 mg via INTRAVENOUS
  Filled 2022-10-15: qty 30

## 2022-10-15 MED ORDER — SODIUM CHLORIDE 0.9% FLUSH
10.0000 mL | INTRAVENOUS | Status: DC | PRN
  Administered 2022-10-15: 10 mL

## 2022-10-15 MED ORDER — SODIUM CHLORIDE 0.9 % IV SOLN: Freq: Once | INTRAVENOUS | Status: AC

## 2022-10-15 MED ORDER — ACETAMINOPHEN 325 MG PO TABS
650.0000 mg | ORAL_TABLET | Freq: Once | ORAL | Status: AC
  Administered 2022-10-15: 650 mg via ORAL
  Filled 2022-10-15: qty 2

## 2022-10-15 MED ORDER — SODIUM CHLORIDE 0.9% FLUSH
10.0000 mL | Freq: Once | INTRAVENOUS | Status: AC
  Administered 2022-10-15: 10 mL via INTRAVENOUS

## 2022-10-15 NOTE — Telephone Encounter (Signed)
-----   Message from Gardenia Phlegm, NP sent at 10/15/2022  1:41 PM EST ----- Mag level is low, tell her I recommend that she take magnesium supplement daily ----- Message ----- From: Interface, [facility] In [place] Sent: 10/15/2022   9:27 AM EST To: Gardenia Phlegm, NP

## 2022-10-15 NOTE — Patient Instructions (Signed)
[facility]  Discharge Instructions: Thank you for choosing [facility] to provide your oncology and hematology care.   If you have a lab appointment with the [facility], please go directly to the [facility] and check in at the registration area.   Wear comfortable clothing and clothing appropriate for easy access to any Portacath or PICC line.   We strive to give you quality time with your provider. You may need to reschedule your appointment if you arrive late (15 or more minutes).  Arriving late affects you and other patients whose appointments are after yours.  Also, if you miss three or more appointments without notifying the office, you may be dismissed from the clinic at the provider's discretion.      For prescription refill requests, have your pharmacy contact our office and allow 72 hours for refills to be completed.    Today you received the following chemotherapy and/or immunotherapy agents Kanjinti      To help prevent nausea and vomiting after your treatment, we encourage you to take your nausea medication as directed.  BELOW ARE SYMPTOMS THAT SHOULD BE REPORTED IMMEDIATELY: *FEVER GREATER THAN 100.4 F (38 C) OR HIGHER *CHILLS OR SWEATING *NAUSEA AND VOMITING THAT IS NOT CONTROLLED WITH YOUR NAUSEA MEDICATION *UNUSUAL SHORTNESS OF BREATH *UNUSUAL BRUISING OR BLEEDING *URINARY PROBLEMS (pain or burning when urinating, or frequent urination) *BOWEL PROBLEMS (unusual diarrhea, constipation, pain near the anus) TENDERNESS IN MOUTH AND THROAT WITH OR WITHOUT PRESENCE OF ULCERS (sore throat, sores in mouth, or a toothache) UNUSUAL RASH, SWELLING OR PAIN  UNUSUAL VAGINAL DISCHARGE OR ITCHING   Items with * indicate a potential emergency and should be followed up as soon as possible or go to the Emergency Department if any problems should occur.  Please show the CHEMOTHERAPY ALERT CARD or IMMUNOTHERAPY ALERT CARD at check-in to  the Emergency Department and triage nurse.  Should you have questions after your visit or need to cancel or reschedule your appointment, please contact [facility]  Dept: [phone]  and follow the prompts.  Office hours are 8:00 a.m. to 4:30 p.m. Monday - Friday. Please note that voicemails left after 4:00 p.m. may not be returned until the following business day.  We are closed weekends and major holidays. You have access to a nurse at all times for urgent questions. Please call the main number to the clinic Dept: [phone] and follow the prompts.   For any non-urgent questions, you may also contact your provider using MyChart. We now offer e-Visits for anyone 18 and older to request care online for non-urgent symptoms. For details visit [email or web address].   Also download the MyChart app! Go to the app store, search "MyChart", open the app, select [facility], and log in with your MyChart username and password.

## 2022-10-15 NOTE — Progress Notes (Signed)
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 Not on file  Stress: Not on file  Social Connections: Not on file  Intimate Partner Violence: Not on file    FAMILY HISTORY: Family History  Problem Relation Age of Onset   Diabetes Mother    Hypertension Mother    Ovarian cancer Mother    Heart disease Father        COPD   Alcohol abuse Father        ETOH dependence   Breast cancer Maternal Aunt        dx in her 48s   Lung cancer Maternal Uncle    Breast cancer Paternal Aunt    Cancer Maternal Grandmother        salivary gland cancer   Colon cancer Neg Hx    Colon polyps Neg Hx  Esophageal cancer Neg Hx    Stomach cancer Neg Hx    Rectal cancer Neg Hx     Review of Systems  Constitutional:  Positive for fatigue. Negative for appetite change, chills, fever and unexpected weight change.  HENT:   Negative for hearing loss, lump/mass and trouble swallowing.   Eyes:  Negative for eye problems and icterus.  Respiratory:  Negative for chest tightness, cough and shortness of breath.   Cardiovascular:  Negative for  chest pain, leg swelling and palpitations.  Gastrointestinal:  Negative for abdominal distention, abdominal pain, constipation, diarrhea, nausea and vomiting.  Endocrine: Negative for hot flashes.  Genitourinary:  Negative for difficulty urinating.   Musculoskeletal:  Positive for back pain (chronic, seeing neurosurgery/pain management). Negative for arthralgias.  Skin:  Negative for itching and rash.  Neurological:  Positive for numbness (chronic, improved after stopping Kadcyla, however also has diabetes). Negative for dizziness, extremity weakness and headaches.  Hematological:  Negative for adenopathy. Does not bruise/bleed easily.  Psychiatric/Behavioral:  Negative for depression. The patient is not nervous/anxious.       PHYSICAL EXAMINATION  ECOG PERFORMANCE STATUS: 1 - Symptomatic but completely ambulatory  Vitals:   10/15/22 0841  BP: (!) 124/97  Pulse: 75  Resp: 18  Temp: 97.7 F (36.5 C)  SpO2: 100%    Physical Exam Constitutional:      General: She is not in acute distress.    Appearance: Normal appearance. She is not toxic-appearing.  HENT:     Head: Normocephalic and atraumatic.  Eyes:     General: No scleral icterus. Cardiovascular:     Rate and Rhythm: Normal rate and regular rhythm.     Pulses: Normal pulses.     Heart sounds: Normal heart sounds.  Pulmonary:     Effort: Pulmonary effort is normal.     Breath sounds: Normal breath sounds.  Abdominal:     General: Abdomen is flat. Bowel sounds are normal. There is no distension.     Palpations: Abdomen is soft.     Tenderness: There is no abdominal tenderness.  Musculoskeletal:        General: No swelling.     Cervical back: Neck supple.  Lymphadenopathy:     Cervical: No cervical adenopathy.  Skin:    General: Skin is warm and dry.     Findings: No rash.  Neurological:     General: No focal deficit present.     Mental Status: She is alert.  Psychiatric:        Mood and Affect: Mood normal.         Behavior: Behavior normal.     LABORATORY DATA:  CBC    Component Value Date/Time   WBC 6.2 10/15/2022 0831   WBC 6.4 09/27/2020 1114   RBC 3.46 (L) 10/15/2022 0831   HGB 10.0 (L) 10/15/2022 0831   HGB 11.9 03/30/2017 1044   HCT 31.0 (L) 10/15/2022 0831   HCT 37.4 03/30/2017 1044   PLT 211 10/15/2022 0831   PLT 238 03/30/2017 1044   MCV 89.6 10/15/2022 0831   MCV 85.0 03/30/2017 1044   MCH 28.9 10/15/2022 0831   MCHC 32.3 10/15/2022 0831   RDW 13.8 10/15/2022 0831   RDW 15.1 (H) 03/30/2017 1044   LYMPHSABS 1.6 10/15/2022 0831   LYMPHSABS 1.3 03/30/2017 1044   MONOABS 0.7 10/15/2022 0831   MONOABS 0.5 03/30/2017 1044   EOSABS 0.1 10/15/2022 0831   EOSABS 0.0 03/30/2017 1044   BASOSABS 0.0 10/15/2022 0831  BASOSABS 0.0 03/30/2017 1044    CMP     Component Value Date/Time   NA 140 10/15/2022 0831   NA 143 03/30/2017 1044   K 3.7 10/15/2022 0831   K 3.8 03/30/2017 1044   CL 105 10/15/2022 0831   CL 106 01/18/2013 0909   CO2 28 10/15/2022 0831   CO2 27 03/30/2017 1044   GLUCOSE 108 (H) 10/15/2022 0831   GLUCOSE 91 03/30/2017 1044   GLUCOSE 99 01/18/2013 0909   BUN 23 10/15/2022 0831   BUN 9.7 03/30/2017 1044   CREATININE 0.90 10/15/2022 0831   CREATININE 0.8 03/30/2017 1044   CALCIUM 9.3 10/15/2022 0831   CALCIUM 9.7 03/30/2017 1044   PROT 8.0 10/15/2022 0831   PROT 8.0 03/30/2017 1044   ALBUMIN 3.7 10/15/2022 0831   ALBUMIN 3.8 01/27/2021 1232   ALBUMIN 3.4 (L) 03/30/2017 1044   AST 19 10/15/2022 0831   AST 17 03/30/2017 1044   ALT 15 10/15/2022 0831   ALT 15 03/30/2017 1044   ALKPHOS 46 10/15/2022 0831   ALKPHOS 60 03/30/2017 1044   BILITOT 0.3 10/15/2022 0831   BILITOT 0.34 03/30/2017 1044   GFRNONAA >60 10/15/2022 0831   GFRAA >60 07/11/2020 0923         ASSESSMENT and THERAPY PLAN:   Malignant neoplasm of central portion of left breast in female, estrogen receptor negative (HCC) [name] is a 66 year old woman with history of  metastatic breast cancer currently on treatment with Herceptin only.  [name] is doing well today.  She has no clinical signs of progression of metastatic breast cancer.  We reviewed her neuropathy and these appear to be related to her comorbidities.  We discussed the moon Charo and I encouraged her to continue this.  I discussed with her that we have a nurse navigator who is on the same medication and has been for several months and is happy to talk to her about it if she has any questions.  [name] asked if I would refer her to diabetes education which I did.  My nurse did come and educate her on information around the class and the phone number.  [name] will continue on Herceptin every 3 weeks and we will see her back in 3 weeks for labs follow-up and her next appointment.  She appears to be due for restaging in February 2024.    All questions were answered. The patient knows to call the clinic with any problems, questions or concerns. We can certainly see the patient much sooner if necessary.  Total encounter time:30 minutes*in face-to-face visit time, chart review, lab review, care coordination, order entry, and documentation of the encounter time.    Wilber Bihari, NP 10/15/22 9:19 AM Medical Oncology and Hematology [facility] [place], [place] [address] Tel. [phone]    Fax. [phone]  *Total Encounter Time as defined by the Centers for Medicare and Medicaid Services includes, in addition to the face-to-face time of a patient visit (documented in the note above) non-face-to-face time: obtaining and reviewing outside history, ordering and reviewing medications, tests or procedures, care coordination (communications with other health care professionals or caregivers) and documentation in the medical record.

## 2022-10-15 NOTE — Telephone Encounter (Signed)
Called Pt and given below message. Pt verbalized understanding.

## 2022-10-15 NOTE — Assessment & Plan Note (Addendum)
[name] is a 66 year old woman with history of metastatic breast cancer currently on treatment with Herceptin only.  [name] is doing well today.  She has no clinical signs of progression of metastatic breast cancer.  We reviewed her neuropathy and these appear to be related to her comorbidities.  We discussed the moon [name] and I encouraged her to continue this.  I discussed with her that we have a nurse navigator who is on the same medication and has been for several months and is happy to talk to her about it if she has any questions.  [name] asked if I would refer her to diabetes education which I did.  My nurse did come and educate her on information around the class and the phone number.  [name] will continue on Herceptin every 3 weeks and we will see her back in 3 weeks for labs follow-up and her next appointment.  She appears to be due for restaging in February 2024.

## 2022-10-16 ENCOUNTER — Other Ambulatory Visit: Payer: Self-pay

## 2022-10-20 ENCOUNTER — Ambulatory Visit (HOSPITAL_COMMUNITY)
Admission: RE | Admit: 2022-10-20 | Discharge: 2022-10-20 | Disposition: A | Discharge status: Home / Self Care | Payer: Medicare Other | Source: Ambulatory Visit | Attending: Family Medicine | Admitting: Family Medicine

## 2022-10-20 ENCOUNTER — Encounter (HOSPITAL_COMMUNITY): Payer: Self-pay

## 2022-10-20 VITALS — BP 122/94 | HR 66 | Wt 244.0 lb

## 2022-10-20 DIAGNOSIS — I110 Hypertensive heart disease with heart failure: Secondary | ICD-10-CM | POA: Diagnosis not present

## 2022-10-20 DIAGNOSIS — Z7901 Long term (current) use of anticoagulants: Secondary | ICD-10-CM | POA: Diagnosis not present

## 2022-10-20 DIAGNOSIS — G5600 Carpal tunnel syndrome, unspecified upper limb: Secondary | ICD-10-CM | POA: Diagnosis not present

## 2022-10-20 DIAGNOSIS — Z90722 Acquired absence of ovaries, bilateral: Secondary | ICD-10-CM | POA: Diagnosis not present

## 2022-10-20 DIAGNOSIS — Z800 Family history of malignant neoplasm of digestive organs: Secondary | ICD-10-CM | POA: Diagnosis not present

## 2022-10-20 DIAGNOSIS — R2689 Other abnormalities of gait and mobility: Secondary | ICD-10-CM | POA: Insufficient documentation

## 2022-10-20 DIAGNOSIS — Z7985 Long-term (current) use of injectable non-insulin antidiabetic drugs: Secondary | ICD-10-CM | POA: Insufficient documentation

## 2022-10-20 DIAGNOSIS — E669 Obesity, unspecified: Secondary | ICD-10-CM | POA: Insufficient documentation

## 2022-10-20 DIAGNOSIS — I10 Essential (primary) hypertension: Secondary | ICD-10-CM

## 2022-10-20 DIAGNOSIS — Z5111 Encounter for antineoplastic chemotherapy: Secondary | ICD-10-CM | POA: Insufficient documentation

## 2022-10-20 DIAGNOSIS — Z6841 Body Mass Index (BMI) 40.0 and over, adult: Secondary | ICD-10-CM | POA: Diagnosis not present

## 2022-10-20 DIAGNOSIS — Z853 Personal history of malignant neoplasm of breast: Secondary | ICD-10-CM | POA: Insufficient documentation

## 2022-10-20 DIAGNOSIS — Z8542 Personal history of malignant neoplasm of other parts of uterus: Secondary | ICD-10-CM | POA: Diagnosis not present

## 2022-10-20 DIAGNOSIS — E1142 Type 2 diabetes mellitus with diabetic polyneuropathy: Secondary | ICD-10-CM | POA: Insufficient documentation

## 2022-10-20 DIAGNOSIS — C50112 Malignant neoplasm of central portion of left female breast: Secondary | ICD-10-CM

## 2022-10-20 DIAGNOSIS — I5032 Chronic diastolic (congestive) heart failure: Secondary | ICD-10-CM

## 2022-10-20 DIAGNOSIS — Z7902 Long term (current) use of antithrombotics/antiplatelets: Secondary | ICD-10-CM | POA: Diagnosis not present

## 2022-10-20 DIAGNOSIS — Z801 Family history of malignant neoplasm of trachea, bronchus and lung: Secondary | ICD-10-CM | POA: Insufficient documentation

## 2022-10-20 DIAGNOSIS — Z171 Estrogen receptor negative status [ER-]: Secondary | ICD-10-CM

## 2022-10-20 DIAGNOSIS — Z86718 Personal history of other venous thrombosis and embolism: Secondary | ICD-10-CM | POA: Insufficient documentation

## 2022-10-20 DIAGNOSIS — Z79899 Other long term (current) drug therapy: Secondary | ICD-10-CM | POA: Insufficient documentation

## 2022-10-20 DIAGNOSIS — Z90710 Acquired absence of both cervix and uterus: Secondary | ICD-10-CM | POA: Insufficient documentation

## 2022-10-20 DIAGNOSIS — R079 Chest pain, unspecified: Secondary | ICD-10-CM | POA: Diagnosis not present

## 2022-10-20 DIAGNOSIS — E785 Hyperlipidemia, unspecified: Secondary | ICD-10-CM | POA: Diagnosis not present

## 2022-10-20 MED ORDER — POTASSIUM CHLORIDE CRYS ER 20 MEQ PO TBCR: 20.0000 meq | EXTENDED_RELEASE_TABLET | Freq: Two times a day (BID) | ORAL | 8 refills | Status: DC

## 2022-10-20 MED ORDER — FUROSEMIDE 40 MG PO TABS: 60.0000 mg | ORAL_TABLET | Freq: Every day | ORAL | 8 refills | Status: DC

## 2022-10-20 NOTE — Patient Instructions (Addendum)
Thank you for coming in today  PLEASE HAVE BMET AND BNP DRAWN AT  YOUR APPT ON 11/05/2022  INCREASE Lasix to 60 mg 1 1/2 tablet daily  INCREASE Potassium to 20 meq 1 tablet times a day   Your physician recommends that you schedule a follow-up appointment in:  Dr. Aundra Dubin with echocardiogram 02/2023  Your physician has requested that you have an echocardiogram. Echocardiography is a painless test that uses sound waves to create images of your heart. It provides your doctor with information about the size and shape of your heart and how well your heart's chambers and valves are working. This procedure takes approximately one hour. There are no restrictions for this procedure.       Do the following things EVERYDAY: Weigh yourself in the morning before breakfast. Write it down and keep it in a log. Take your medicines as prescribed Eat low salt foods--Limit salt (sodium) to 2000 mg per day.  Stay as active as you can everyday Limit all fluids for the day to less than 2 liters   At the [facility], you and your health needs are our priority. As part of our continuing mission to provide you with exceptional heart care, we have created designated Provider Care Teams. These Care Teams include your primary Cardiologist (physician) and Advanced Practice Providers (APPs- Physician Assistants and Nurse Practitioners) who all work together to provide you with the care you need, when you need it.   You may see any of the following providers on your designated Care Team at your next follow up: Dr Glori Bickers Dr Loralie Champagne Dr. Roxana Hires, NP Lyda Jester, [place] [facility] [place], [place] Forestine Na, NP Audry Riles, PharmD   Please be sure to bring in all your medications bottles to every appointment.   If you have any questions or concerns before your next appointment please send [place] a message through [place] or call our office at  [phone].    TO LEAVE A MESSAGE FOR THE NURSE SELECT OPTION 2, PLEASE LEAVE A MESSAGE INCLUDING: YOUR NAME DATE OF BIRTH CALL BACK NUMBER REASON FOR CALL**this is important as we prioritize the call backs  YOU WILL RECEIVE A CALL BACK THE SAME DAY AS LONG AS YOU CALL BEFORE 4:00 PM

## 2022-10-21 ENCOUNTER — Telehealth: Payer: Self-pay | Admitting: Pharmacist

## 2022-10-21 MED ORDER — MOUNJARO 5 MG/0.5ML ~~LOC~~ SOAJ: 5.0000 mg | SUBCUTANEOUS | 0 refills | Status: DC

## 2022-10-21 NOTE — Addendum Note (Signed)
Addended by: Nicki Furlan E on: 10/21/2022 02:59 PM   Modules accepted: Orders

## 2022-10-21 NOTE — Telephone Encounter (Signed)
Called pt to follow up with tolerability of Mounjaro and increase dose. She's given 3 doses so far. Tolerating well. Less binge eating. Weight was up 2 lbs at recent CHF appt. Would like to increase dose for next month, refill sent in. Will call pt in another month for further dose increase.

## 2022-10-21 NOTE — Telephone Encounter (Signed)
Prior auth approved through 10/19/23.

## 2022-10-21 NOTE — Telephone Encounter (Signed)
Prior auth submitted, Key: [id number]

## 2022-10-22 NOTE — Progress Notes (Signed)
[name], [name] ([id number]) [id number] Page 1 of 7 Visit Report for 10/08/2022 Arrival Information Details Patient Name: Date of Service: [name] 10/08/2022 3:00 PM Medical Record Number: [id number] Patient Account Number: [id number] Date of Birth/Sex: Treating RN: Sep 21, 1956 (67 y.o. F) Primary Care Damichael Hofman: Cathlean Cower Other Clinician: Referring Eryanna Regal: Treating Laiken Nohr/Extender: Hollie Beach in Treatment: 9 Visit Information History Since Last Visit Added or deleted any medications: No Patient Arrived: [name] Any new allergies or adverse reactions: No Arrival Time: 15:04 Had a fall or experienced change in No Accompanied By: self activities of daily living that may affect Transfer Assistance: None risk of falls: Patient Identification Verified: Yes Signs or symptoms of abuse/neglect since last visito No Secondary Verification Process Completed: Yes Hospitalized since last visit: No Patient Requires Transmission-Based Precautions: No Implantable device outside of the clinic excluding No Patient Has Alerts: No cellular tissue based products placed in the center since last visit: Has Dressing in Place as Prescribed: Yes Pain Present Now: No Electronic Signature(s) Signed: 10/09/2022 8:46:25 AM By: Erenest Blank Entered By: Erenest Blank on 10/08/2022 15:05:40 -------------------------------------------------------------------------------- Clinic Level of Care Assessment Details Patient Name: Date of Service: Jacqueline Lyons Young. 10/08/2022 3:00 PM Medical Record Number: [id number] Patient Account Number: [id number] Date of Birth/Sex: Treating RN: 08/18/1956 (67 y.o. Debby Bud Primary Care Sophonie Goforth: Cathlean Cower Other Clinician: Referring Coley Kulikowski: Treating Damiya Sandefur/Extender: Hollie Beach in Treatment: 9 Clinic Level of Care Assessment Items TOOL 4 Quantity  Score '[]'$  - 0 Use when only an EandM is performed on FOLLOW-UP visit ASSESSMENTS - Nursing Assessment / Reassessment X- 1 10 Reassessment of Co-morbidities (includes updates in patient status) X- 1 5 Reassessment of Adherence to Treatment Plan ASSESSMENTS - Wound and Skin A ssessment / Reassessment X - Simple Wound Assessment / Reassessment - one wound 1 5 '[]'$  - 0 Complex Wound Assessment / Reassessment - multiple wounds '[]'$  - 0 Dermatologic / Skin Assessment (not related to wound area) ASSESSMENTS - Focused Assessment '[]'$  - 0 Circumferential Edema Measurements - multi extremities '[]'$  - 0 Nutritional Assessment / Counseling / Intervention RAIDEN, YEARWOOD Young ([id number]) [id number] Page 2 of 7 '[]'$  - 0 Lower Extremity Assessment (monofilament, tuning fork, pulses) '[]'$  - 0 Peripheral Arterial Disease Assessment (using hand held doppler) ASSESSMENTS - Ostomy and/or Continence Assessment and Care '[]'$  - 0 Incontinence Assessment and Management '[]'$  - 0 Ostomy Care Assessment and Management (repouching, etc.) PROCESS - Coordination of Care X - Simple Patient / Family Education for ongoing care 1 15 '[]'$  - 0 Complex (extensive) Patient / Family Education for ongoing care '[]'$  - 0 Staff obtains Programmer, systems, Records, T Results / Process Orders est '[]'$  - 0 Staff telephones HHA, Nursing Homes / Clarify orders / etc '[]'$  - 0 Routine Transfer to another Facility (non-emergent condition) '[]'$  - 0 Routine Hospital Admission (non-emergent condition) '[]'$  - 0 New Admissions / Biomedical engineer / Ordering NPWT Apligraf, etc. , '[]'$  - 0 Emergency Hospital Admission (emergent condition) X- 1 10 Simple Discharge Coordination '[]'$  - 0 Complex (extensive) Discharge Coordination PROCESS - Special Needs '[]'$  - 0 Pediatric / Minor Patient Management '[]'$  - 0 Isolation Patient Management '[]'$  - 0 Hearing / Language / Visual special needs '[]'$  - 0 Assessment of Community assistance  (transportation, D/C planning, etc.) '[]'$  - 0 Additional assistance / Altered mentation X- 1 15 Support Surface(s) Assessment (bed, cushion, seat, etc.) INTERVENTIONS - Wound Cleansing / Measurement X - Simple Wound Cleansing -  one wound 1 5 '[]'$  - 0 Complex Wound Cleansing - multiple wounds X- 1 5 Wound Imaging (photographs - any number of wounds) '[]'$  - 0 Wound Tracing (instead of photographs) X- 1 5 Simple Wound Measurement - one wound '[]'$  - 0 Complex Wound Measurement - multiple wounds INTERVENTIONS - Wound Dressings X - Small Wound Dressing one or multiple wounds 1 10 '[]'$  - 0 Medium Wound Dressing one or multiple wounds '[]'$  - 0 Large Wound Dressing one or multiple wounds '[]'$  - 0 Application of Medications - topical '[]'$  - 0 Application of Medications - injection INTERVENTIONS - Miscellaneous '[]'$  - 0 External ear exam '[]'$  - 0 Specimen Collection (cultures, biopsies, blood, body fluids, etc.) '[]'$  - 0 Specimen(s) / Culture(s) sent or taken to Lab for analysis '[]'$  - 0 Patient Transfer (multiple staff / Civil Service fast streamer / Similar devices) '[]'$  - 0 Simple Staple / Suture removal (25 or less) '[]'$  - 0 Complex Staple / Suture removal (26 or more) '[]'$  - 0 Hypo / Hyperglycemic Management (close monitor of Blood Glucose) [name], [name] ([id number]) [id number] Page 3 of 7 '[]'$  - 0 Ankle / Brachial Index (ABI) - do not check if billed separately X- 1 5 Vital Signs Has the patient been seen at the hospital within the last three years: Yes Total Score: 90 Level Of Care: New/Established - Level 3 Electronic Signature(s) Signed: 10/13/2022 5:26:14 PM By: Deon Pilling RN, BSN Entered By: Deon Pilling on 10/13/2022 13:40:19 -------------------------------------------------------------------------------- Lower Extremity Assessment Details Patient Name: Date of Service: Jacqueline Lyons Young. 10/08/2022 3:00 PM Medical Record Number: [id number] Patient Account  Number: [id number] Date of Birth/Sex: Treating RN: 1956/01/13 (67 y.o. F) Primary Care Angelin Cutrone: Cathlean Cower Other Clinician: Referring Sheretha Shadd: Treating Mekaylah Klich/Extender: Hollie Beach in Treatment: 9 Electronic Signature(s) Signed: 10/09/2022 8:46:25 AM By: Erenest Blank Entered By: Erenest Blank on 10/08/2022 15:10:09 -------------------------------------------------------------------------------- Multi Wound Chart Details Patient Name: Date of Service: Jacqueline Lyons Young. 10/08/2022 3:00 PM Medical Record Number: [id number] Patient Account Number: [id number] Date of Birth/Sex: Treating RN: [birth year] (67 y.o. F) Primary Care Nadav Swindell: Cathlean Cower Other Clinician: Referring Joelle Roswell: Treating Dayzha Pogosyan/Extender: Hollie Beach in Treatment: 9 Vital Signs Height(in): Pulse(bpm): 72 Weight(lbs): Blood Pressure(mmHg): 130/79 Body Mass Index(BMI): Temperature(F): 98.2 Respiratory Rate(breaths/min): 18 [2:Photos:] [N/A:N/A] Sacrum N/A N/A Wound Location: Gradually Appeared N/A N/A Wounding Event: MASD N/A N/A Primary Etiology: Anemia, Lymphedema, Sleep Apnea, N/A N/A Comorbid History: Hypertension, Type II Diabetes, Osteoarthritis, Received Radiation 10/19/2005 N/A N/A Date Acquired: 79 N/A N/A Weeks of TreatmentBLAKELEY, [name] ([id number]) [id number] Page 4 of 7 Open N/A N/A Wound Status: No N/A N/A Wound Recurrence: 1x0.1x0.1 N/A N/A Measurements L x W x D (cm) 0.079 N/A N/A A (cm) : rea 0.008 N/A N/A Volume (cm) : 97.20% N/A N/A % Reduction in Area: 98.60% N/A N/A % Reduction in Volume: Full Thickness With Exposed Support N/A N/A Classification: Structures Medium N/A N/A Exudate Amount: Serosanguineous N/A N/A Exudate Type: red, brown N/A N/A Exudate Color: Distinct, outline attached N/A N/A Wound Margin: Large (67-100%) N/A N/A Granulation Amount: Red, Pink N/A  N/A Granulation Quality: Small (1-33%) N/A N/A Necrotic Amount: Fat Layer (Subcutaneous Tissue): Yes N/A N/A Exposed Structures: Fascia: No Tendon: No Muscle: No Joint: No Bone: No Large (67-100%) N/A N/A Epithelialization: Excoriation: No N/A N/A Periwound Skin Texture: Induration: No Callus: No Crepitus: No Rash: No Scarring: No Maceration: No N/A N/A Periwound Skin Moisture: Dry/Scaly: No Atrophie Blanche: No  N/A N/A Periwound Skin Color: Cyanosis: No Ecchymosis: No Erythema: No Hemosiderin Staining: No Mottled: No Pallor: No Rubor: No No Abnormality N/A N/A Temperature: Yes N/A N/A Tenderness on Palpation: Treatment Notes Electronic Signature(s) Signed: 10/08/2022 4:16:51 PM By: Kalman Shan DO Entered By: Kalman Shan on 10/08/2022 16:00:03 -------------------------------------------------------------------------------- Multi-Disciplinary Care Plan Details Patient Name: Date of Service: [name], Jacqueline Gong Young. 10/08/2022 3:00 PM Medical Record Number: [id number] Patient Account Number: [id number] Date of Birth/Sex: Treating RN: November 20, 1955 (67 y.o. [name], [name] Javarie Crisp: Cathlean Cower Other Clinician: Referring Ercel Pepitone: Treating Young Mulvey/Extender: Hollie Beach in Treatment: 9 Active Inactive Wound/Skin Impairment Nursing Diagnoses: Impaired tissue integrity Knowledge deficit related to ulceration/compromised skin integrity Goals: Patient will have a decrease in wound volume by X% from date: (specify in notes) Date Initiated: 08/03/2022 Target Resolution Date: 10/16/2022 Goal Status: Active Patient/caregiver will verbalize understanding of skin care regimen [name], [name] ([id number]) [phone].pdf Page 5 of 7 Date Initiated: 08/03/2022 Target Resolution Date: 10/16/2022 Goal Status: Active Ulcer/skin breakdown will have a volume reduction of 30% by week 4 Date  Initiated: 08/03/2022 Target Resolution Date: 10/16/2022 Goal Status: Active Interventions: Assess patient/caregiver ability to obtain necessary supplies Assess patient/caregiver ability to perform ulcer/skin care regimen upon admission and as needed Assess ulceration(s) every visit Notes: Electronic Signature(s) Signed: 10/21/2022 5:35:40 PM By: Rhae Hammock RN Entered By: Rhae Hammock on 10/08/2022 15:25:19 -------------------------------------------------------------------------------- Pain Assessment Details Patient Name: Date of Service: Jacqueline Lyons Young. 10/08/2022 3:00 PM Medical Record Number: [id number] Patient Account Number: [id number] Date of Birth/Sex: Treating RN: September 21, 1956 (67 y.o. F) Primary Care Keiston Manley: Cathlean Cower Other Clinician: Referring Carma Dwiggins: Treating Freja Faro/Extender: Hollie Beach in Treatment: 9 Active Problems Location of Pain Severity and Description of Pain Patient Has Paino No Site Locations Pain Management and Medication Current Pain Management: Electronic Signature(s) Signed: 10/09/2022 8:46:25 AM By: Erenest Blank Entered By: Erenest Blank on 10/08/2022 15:10:02 Patient/Caregiver Education Details -------------------------------------------------------------------------------- [name] ([id number]) [phone].pdf Page 6 of 7 Patient Name: Date of Service: [name] 12/21/2023andnbsp3:00 PM Medical Record Number: [id number] Patient Account Number: [id number] Date of Birth/Gender: Treating RN: 1956/08/19 (67 y.o. [name], [name] Physician: Cathlean Cower Other Clinician: Referring Physician: Treating Physician/Extender: Hollie Beach in Treatment: 9 Education Assessment Education Provided To: Patient Education Topics Provided Wound/Skin Impairment: Methods: Explain/Verbal Responses: Reinforcements needed,  State content correctly [employer]) Signed: 10/21/2022 5:35:40 PM By: Rhae Hammock RN Entered By: Rhae Hammock on 10/08/2022 15:25:30 -------------------------------------------------------------------------------- Wound Assessment Details Patient Name: Date of Service: Jacqueline Lyons Young. 10/08/2022 3:00 PM Medical Record Number: [id number] Patient Account Number: [id number] Date of Birth/Sex: Treating RN: 26-Oct-1955 (67 y.o. F) Primary Care Kaleb Sek: Cathlean Cower Other Clinician: Referring Raiza Kiesel: Treating Lamiyah Schlotter/Extender: Hollie Beach in Treatment: 9 Wound Status Wound Number: 2 Primary MASD Etiology: Wound Location: Sacrum Wound Open Wounding Event: Gradually Appeared Status: Date Acquired: 10/19/2005 Comorbid Anemia, Lymphedema, Sleep Apnea, Hypertension, Type II Weeks Of Treatment: 9 History: Diabetes, Osteoarthritis, Received Radiation Clustered Wound: No Photos Wound Measurements Length: (cm) 1 Width: (cm) 0.1 Depth: (cm) 0.1 Area: (cm) 0.079 Volume: (cm) 0.008 % Reduction in Area: 97.2% % Reduction in Volume: 98.6% Epithelialization: Large (67-100%) Tunneling: No Undermining: No Wound Description Classification: Full Thickness With Exposed Support S Wound Margin: Distinct, outline attached [name], [name] ([id number]) Exudate Amount: Medium Exudate Type: Serosanguineous Exudate Color: red, brown tructures Foul Odor After Cleansing: No  Slough/Fibrino Yes [id number] Page 7 of 7 Wound Bed Granulation Amount: Large (67-100%) Exposed Structure Granulation Quality: Red, Pink Fascia Exposed: No Necrotic Amount: Small (1-33%) Fat Layer (Subcutaneous Tissue) Exposed: Yes Tendon Exposed: No Muscle Exposed: No Joint Exposed: No Bone Exposed: No Periwound Skin Texture Texture Color No Abnormalities Noted: No No Abnormalities Noted: No Callus: No Atrophie Blanche:  No Crepitus: No Cyanosis: No Excoriation: No Ecchymosis: No Induration: No Erythema: No Rash: No Hemosiderin Staining: No Scarring: No Mottled: No Pallor: No Moisture Rubor: No No Abnormalities Noted: No Dry / Scaly: No Temperature / Pain Maceration: No Temperature: No Abnormality Tenderness on Palpation: Yes Electronic Signature(s) Signed: 10/09/2022 8:46:25 AM By: Erenest Blank Entered By: Erenest Blank on 10/08/2022 15:18:04 -------------------------------------------------------------------------------- Vitals Details Patient Name: Date of Service: [name], Jacqueline Gong Young. 10/08/2022 3:00 PM Medical Record Number: [id number] Patient Account Number: [id number] Date of Birth/Sex: Treating RN: June 08, 1956 (67 y.o. F) Primary Care Atianna Haidar: Cathlean Cower Other Clinician: Referring Drucella Karbowski: Treating Torez Beauregard/Extender: Hollie Beach in Treatment: 9 Vital Signs Time Taken: 15:08 Temperature (F): 98.2 Pulse (bpm): 78 Respiratory Rate (breaths/min): 18 Blood Pressure (mmHg): 130/79 Reference Range: 80 - 120 mg / dl Electronic Signature(s) Signed: 10/09/2022 8:46:25 AM By: Erenest Blank Entered By: Erenest Blank on 10/08/2022 15:09:52

## 2022-10-23 ENCOUNTER — Encounter (HOSPITAL_BASED_OUTPATIENT_CLINIC_OR_DEPARTMENT_OTHER): Payer: Medicare Other | Attending: Internal Medicine | Admitting: Internal Medicine

## 2022-10-23 DIAGNOSIS — Z96653 Presence of artificial knee joint, bilateral: Secondary | ICD-10-CM | POA: Insufficient documentation

## 2022-10-23 DIAGNOSIS — L98412 Non-pressure chronic ulcer of buttock with fat layer exposed: Secondary | ICD-10-CM | POA: Insufficient documentation

## 2022-10-23 DIAGNOSIS — E11622 Type 2 diabetes mellitus with other skin ulcer: Secondary | ICD-10-CM | POA: Diagnosis not present

## 2022-10-23 DIAGNOSIS — Z8542 Personal history of malignant neoplasm of other parts of uterus: Secondary | ICD-10-CM | POA: Insufficient documentation

## 2022-10-23 DIAGNOSIS — Z853 Personal history of malignant neoplasm of breast: Secondary | ICD-10-CM | POA: Insufficient documentation

## 2022-10-23 DIAGNOSIS — Z9221 Personal history of antineoplastic chemotherapy: Secondary | ICD-10-CM | POA: Diagnosis not present

## 2022-10-23 NOTE — Progress Notes (Addendum)
Jacqueline, Young ([id number]) [id number] Page 1 of 8 Visit Report for 10/23/2022 Chief Complaint Document Details Patient Name: Date of Service: [name] 10/23/2022 8:45 A M Medical Record Number: [id number] Patient Account Number: [id number] Date of Birth/Sex: Treating RN: 1956/03/07 (67 y.o. F) Primary Care Provider: Cathlean Cower Other Clinician: Referring Provider: Treating Provider/Extender: Hollie Beach in Treatment: 11 Information Obtained from: Patient Chief Complaint 08/03/2022; several year history of waxing and waning to a wound to the intergluteal cleft Electronic Signature(s) Signed: 10/23/2022 11:39:44 AM By: Kalman Shan DO Entered By: Kalman Shan on 10/23/2022 10:08:30 -------------------------------------------------------------------------------- HPI Details Patient Name: Date of Service: Jacqueline Lyons V. 10/23/2022 8:45 A M Medical Record Number: [id number] Patient Account Number: [id number] Date of Birth/Sex: Treating RN: 07-05-1956 (67 y.o. F) Primary Care Provider: Cathlean Cower Other Clinician: Referring Provider: Treating Provider/Extender: Hollie Beach in Treatment: 11 History of Present Illness HPI Description: 11/02/2019 ADMISSION This is a 67 year old woman who has a wound on the right posterior Achilles area towards the distal end of the Achilles tendon surgery scars. She tells me that roughly 2-1/2 to 3 weeks ago she was picking at some dry skin in the area pulled it off and that is how the wound opened up. She had an original spontaneous Achilles tendon rupture in the early 1990s that was repaired and then very shortly thereafter it reruptured and she required another surgery. She has not had any problems with this since then. The wound itself currently has episodic sharp pain some swelling. She was put on Augmentin by her primary physician on 10/30/2019  for possible coexistent cellulitis. She used hydrogen peroxide for a while with topical antibiotics. Mostly she has been using topical antibiotics as somebody told her that hydrogen peroxide was not good to use. 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 Surgery was in [place], bilateral total knee replacements. ABIs in our clinic on the right were 1.09 1/21; patient here with a linear wound in the right Achilles area. Her wrap fell down she did not call us to replace it. She has questions about her compression wrap necessity. Finally she asked if the wound could be sutured The patient is under a lot of pressure with initiation of chemotherapy she is going to have a port placed etc. 1/28; linear wound in the right Achilles area in the setting of previous surgery. She is going for a port for chemotherapy for her breast cancer. She has apparently made herself an appointment with orthopedic surgery. I am not sure who she has made an appointment with at this point. 2/12 linear wound in the right Achilles is come in in terms of length. This is in the setting of previous surgery in this area The patient is undergoing chemotherapy for breast cancer and having significant side effects [diarrhea] I am trying not to bring in her in too often as long as her wound is contracted 3/5; linear wound in the right Achilles. Not quite as long however still some measurable depth. She tells [place] that she has had problems with chemotherapy also apparently pseudomembranous colitis. She has not had much attention to paid to the wound. Came in without any  dressing on etc. 4/16; right Achilles. She is completely closed 08/03/2022 Ms. [name] is a 67 year old female with a past medical history of left sided breast cancer currently on chemotherapy, morbid obesity and CYNITHIA, Young V ([id number]) [id number] Page 2 of 8 currently controlled type 2 diabetes that presents the clinic for a wound to her intergluteal cleft. She states that this is an area that [place] and wanes in healing. She does report sitting for most of the day. She has been using zinc oxide to the area. She currently denies signs of infection. 10/23; patient presents for follow-up. She has been using AandD ointment to the wound bed. She did not receive her supplies from [facility]. She has no issues or complaints today. 11/17; patient missed her last clinic appointment. She states she has been using AandD ointment to the wound bed along with silver alginate. She has no issues or complaints today. 12/1; patient presents for follow-up. Patient states she has been very busy over the holidays and has been unable to use the AandD ointment or silver alginate to the wound area. 12/21; patient presents for follow-up. She has been using AandD ointment to the wound bed. She states it is tough to tend to the wound every day. 1/5; patient presents for follow-up. She has been using bacitracin ointment to the wound bed. She purchased Anasept spray and has been using this to clean the wound bed. She has no issues or complaints today. Electronic Signature(s) Signed: 10/23/2022 11:39:44 AM By: Kalman Shan DO Entered By: Kalman Shan on 10/23/2022 10:09:03 -------------------------------------------------------------------------------- Physical Exam Details Patient Name: Date of Service: Jacqueline Lyons V. 10/23/2022 8:45 A M Medical Record Number: [id number] Patient Account Number: [id number] Date of Birth/Sex: Treating RN: [birth year] (67 y.o. F) Primary Care Provider: Cathlean Cower Other Clinician: Referring Provider: Treating  Provider/Extender: Hollie Beach in Treatment: 11 Constitutional respirations regular, non-labored and within target range for patient.[name] Psychiatric pleasant and cooperative. Notes T the intergluteal cleft there is 1 wound limited to skin breakdown. The previous wound more distal to this area has epithelialized. No signs of infection. o Electronic Signature(s) Signed: 10/23/2022 11:39:44 AM By: Kalman Shan DO Entered By: Kalman Shan on 10/23/2022 10:09:32 -------------------------------------------------------------------------------- Physician Orders Details Patient Name: Date of Service: Jacqueline Lyons V. 10/23/2022 8:45 A M Medical Record Number: [id number] Patient Account Number: [id number] Date of Birth/Sex: Treating RN: 06-23-1956 (67 y.o. [name] Primary Care Provider: Cathlean Cower Other Clinician: Referring Provider: Treating Provider/Extender: Hollie Beach in Treatment: 11 Verbal / Phone Orders: No Diagnosis Coding ICD-10 Coding Code Description E11.622 Type 2 diabetes mellitus with other skin ulcer L98.412 Non-pressure chronic ulcer of buttock with fat layer exposed ELEONORA, PEELER V ([id number]) [phone].pdf Page 3 of 8 Follow-up Appointments ppointment in 2 weeks. - Dr. Heber [place] 11/06/2022 0930 room 9 Return A Bathing/ Shower/ Hygiene May shower and wash wound with soap and water. - use anacept spray cleanse wound after shower. Off-Loading Turn and reposition every 2 hours Wound Treatment Wound #2 - Sacrum Cleanser: Anasept Antimicrobial Skin and Wound Cleanser, 8 (oz) 1 x Per Day/30 Days Discharge Instructions: Cleanse the wound with Anasept cleanser prior to applying a clean dressing using gauze sponges, not tissue or cotton balls. Topical: Gentamicin 1 x Per Day/30 Days Discharge Instructions: As directed by physician; apply only in clinic. Topical: bacitracin 1 x Per  Day/30 Days Discharge Instructions: use daily at home. Secondary Dressing: Woven Gauze Sponge, Non-Sterile  4x4 in (Generic) 1 x Per Day/30 Days Discharge Instructions: Apply over primary dressing as directed. Electronic Signature(s) Signed: 10/23/2022 11:39:44 AM By: Kalman Shan DO Entered By: Kalman Shan on 10/23/2022 10:09:37 -------------------------------------------------------------------------------- Problem List Details Patient Name: Date of Service: Jacqueline Lyons V. 10/23/2022 8:45 A M Medical Record Number: [id number] Patient Account Number: [id number] Date of Birth/Sex: Treating RN: Feb 01, 1956 (67 y.o. [name] Primary Care Provider: Cathlean Cower Other Clinician: Referring Provider: Treating Provider/Extender: Hollie Beach in Treatment: 11 Active Problems ICD-10 Encounter Code Description Active Date MDM Diagnosis E11.622 Type 2 diabetes mellitus with other skin ulcer 08/03/2022 No Yes L98.412 Non-pressure chronic ulcer of buttock with fat layer exposed 08/03/2022 No Yes Inactive Problems Resolved Problems Electronic Signature(s) Signed: 10/23/2022 11:39:44 AM By: Kalman Shan DO Entered By: Kalman Shan on 10/23/2022 10:08:12 [name] ([id number]) [id number] Page 4 of 8 -------------------------------------------------------------------------------- Progress Note Details Patient Name: Date of Service: [name] 10/23/2022 8:45 A M Medical Record Number: [id number] Patient Account Number: [id number] Date of Birth/Sex: Treating RN: 1956-08-14 (67 y.o. F) Primary Care Provider: Cathlean Cower Other Clinician: Referring Provider: Treating Provider/Extender: Hollie Beach in Treatment: 11 Subjective Chief Complaint Information obtained from Patient 08/03/2022; several year history of waxing and waning to a wound to the intergluteal cleft History  of Present Illness (HPI) 11/02/2019 ADMISSION This is a 67 year old woman who has a wound on the right posterior Achilles area towards the distal end of the Achilles tendon surgery scars. She tells me that roughly 2-1/2 to 3 weeks ago she was picking at some dry skin in the area pulled it off and that is how the wound opened up. She had an original spontaneous Achilles tendon rupture in the early 1990s that was repaired and then very shortly thereafter it reruptured and she required another surgery. She has not had any problems with this since then. The wound itself currently has episodic sharp pain some swelling. She was put on Augmentin by her primary physician on 10/30/2019 for possible coexistent cellulitis. She used hydrogen peroxide for a while with topical antibiotics. Mostly she has been using topical antibiotics as somebody told her that hydrogen peroxide was not good to use. 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 Surgery was in [place], bilateral total knee replacements. ABIs in our clinic on the right were 1.09 1/21; patient here with a linear wound in the right Achilles area. Her wrap fell down she did not call us to replace it. She has questions about her compression wrap necessity. Finally she asked if the wound could be sutured The patient is under a lot of pressure with initiation of chemotherapy she is going to have a port placed etc. 1/28; linear wound in the right Achilles area in the setting of previous surgery. She is going for a port for chemotherapy for her breast cancer. She has apparently made herself an appointment with orthopedic surgery. I am not sure who she has made an appointment with at  this point. 2/12 linear wound in the right Achilles is come in in terms of length. This is in the setting of previous surgery in this area The patient is undergoing chemotherapy  for breast cancer and having significant side effects [diarrhea] I am trying not to bring in her in too often as long as her wound is contracted 3/5; linear wound in the right Achilles. Not quite as long however still some measurable depth. She tells [place] that she has had problems with chemotherapy also apparently pseudomembranous colitis. She has not had much attention to paid to the wound. Came in without any dressing on etc. 4/16; right Achilles. She is completely closed 08/03/2022 [name] is a 67 year old female with a past medical history of left sided breast cancer currently on chemotherapy, morbid obesity and currently controlled type 2 diabetes that presents the clinic for a wound to her intergluteal cleft. She states that this is an area that [place] and wanes in healing. She does report sitting for most of the day. She has been using zinc oxide to the area. She currently denies signs of infection. 10/23; patient presents for follow-up. She has been using AandD ointment to the wound bed. She did not receive her supplies from [facility]. She has no issues or complaints today. 11/17; patient missed her last clinic appointment. She states she has been using AandD ointment to the wound bed along with silver alginate. She has no issues or complaints today. 12/1; patient presents for follow-up. Patient states she has been very busy over the holidays and has been unable to use the AandD ointment or silver alginate to the wound area. 12/21; patient presents for follow-up. She has been using AandD ointment to the wound bed. She states it is tough to tend to the wound every day. 1/5; patient presents for follow-up. She has been using bacitracin ointment to the wound bed. She purchased Anasept spray and has been using  this to clean the wound bed. She has no issues or complaints today. Patient History Information obtained from Patient. Family History Diabetes - Paternal Grandparents,Mother,Father, Heart Disease - Father, Hypertension - Mother,Father, No family history of Cancer, Hereditary Spherocytosis, Kidney Disease, Lung Disease, Seizures, Stroke, Thyroid Problems, Tuberculosis. 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 Medical History Hematologic/Lymphatic Patient has history of Anemia, Lymphedema Respiratory Patient has history of Sleep Apnea - no CPAP Cardiovascular ISOBELLE, TUCKETT V ([id number]) [phone].pdf Page 5 of 8 Patient has history of Hypertension Endocrine Patient has history of Type II Diabetes Genitourinary Denies history of End Stage Renal Disease Integumentary (Skin) Denies history of History of Burn Musculoskeletal Patient has history of Osteoarthritis Oncologic Patient has history of Received Radiation Denies history of Received Chemotherapy Psychiatric Denies history of Anorexia/bulimia, Confinement Anxiety Hospitalization/Surgery History - bilk knee replacements. - hysterectomy. - left breast lumpectomy. - left achilles repair. - right achilles repair. - c-section. - neck surgery-pinched spinal cord. Medical A Surgical History Notes nd Constitutional Symptoms (General Health) morbid obesity Genitourinary urinary incontinence Oncologic hx left breast CA with recurring metastatic disease, hx uterine CA Objective Constitutional respirations regular, non-labored and within target range for patient.. Vitals Time Taken: 9:06 AM, Temperature: 98 F, Pulse: 72 bpm, Respiratory Rate: 18 breaths/min, Blood Pressure: 108/68 mmHg. Psychiatric pleasant and cooperative. General Notes: T the intergluteal cleft there is 1 wound limited to skin  breakdown. The previous wound more distal to this area has epithelialized. No signs of o infection. Integumentary (Hair, Skin) Wound #2 status is Open. Original cause of wound was Gradually Appeared. The date acquired was: 10/19/2005. The wound has been in  treatment 11 weeks. The wound is located on the Sacrum. The wound measures 2.5cm length x 2cm width x 0.1cm depth; 3.927cm^2 area and 0.393cm^3 volume. There is Fat Layer (Subcutaneous Tissue) exposed. There is no tunneling or undermining noted. There is a medium amount of serosanguineous drainage noted. The wound margin is distinct with the outline attached to the wound base. There is large (67-100%) red, pink granulation within the wound bed. There is a small (1-33%) amount of necrotic tissue within the wound bed. The periwound skin appearance did not exhibit: Callus, Crepitus, Excoriation, Induration, Rash, Scarring, Dry/Scaly, Maceration, Atrophie Blanche, Cyanosis, Ecchymosis, Hemosiderin Staining, Mottled, Pallor, Rubor, Erythema. Periwound temperature was noted as No Abnormality. The periwound has tenderness on palpation. Assessment Active Problems ICD-10 Type 2 diabetes mellitus with other skin ulcer Non-pressure chronic ulcer of buttock with fat layer exposed Patient has healed one of the 2 wounds to the intergluteal cleft. She has 1 wound to the superior portion that is limited to skin breakdown. I recommended continuing with Anasept to use between dressing changes and continue with bacitracin ointment. If wound declines she may benefit from a compounded ointment of steroid and antifungal such as betamethasone/clotrimazole. Continue aggressive offloading. Follow-up in 1 week. Plan Follow-up Appointments: Return Appointment in 2 weeks. - Dr. Heber [place]onan 11/06/2022 0930 room 9 Bathing/ Shower/ Hygiene: May shower and wash wound with soap and water. - use anacept spray cleanse wound after shower. Off-Loading: Turn and reposition every 2  hours YOSELIN, AMERMAN V ([id number]) [phone].pdf Page 6 of 8 WOUND #2: - Sacrum Wound Laterality: Cleanser: Anasept Antimicrobial Skin and Wound Cleanser, 8 (oz) 1 x Per Day/30 Days Discharge Instructions: Cleanse the wound with Anasept cleanser prior to applying a clean dressing using gauze sponges, not tissue or cotton balls. Topical: Gentamicin 1 x Per Day/30 Days Discharge Instructions: As directed by physician; apply only in clinic. Topical: bacitracin 1 x Per Day/30 Days Discharge Instructions: use daily at home. Secondary Dressing: Woven Gauze Sponge, Non-Sterile 4x4 in (Generic) 1 x Per Day/30 Days Discharge Instructions: Apply over primary dressing as directed. 1. Bacitracin 2. Anasept spray 3. Continue aggressive offloading 4. Follow-up in 2 weeks Electronic Signature(s) Signed: 11/11/2022 9:26:44 AM By: Kalman Shan DO Previous Signature: 10/23/2022 11:39:44 AM Version By: Kalman Shan DO Entered By: Kalman Shan on 11/06/2022 09:17:06 -------------------------------------------------------------------------------- HxROS Details Patient Name: Date of Service: Jacqueline Lyons V. 10/23/2022 8:45 A M Medical Record Number: [id number] Patient Account Number: [id number] Date of Birth/Sex: Treating RN: [birth year] (67 y.o. F) Primary Care Provider: Cathlean Cower Other Clinician: Referring Provider: Treating Provider/Extender: Hollie Beach in Treatment: 11 Information Obtained From Patient Constitutional Symptoms (General Health) Medical History: Past Medical History Notes: morbid obesity Hematologic/Lymphatic Medical History: Positive for: Anemia; Lymphedema Respiratory Medical History: Positive for: Sleep Apnea - no CPAP Cardiovascular Medical History: Positive for: Hypertension Endocrine Medical History: Positive for: Type II Diabetes Time with diabetes: 5 yrs Treated with: Oral agents Blood sugar  tested every day: No Genitourinary Medical History: Negative for: End Stage Renal Disease Past Medical History Notes: urinary incontinence ABAGAIL, LIMB V ([id number]) [id number] Page 7 of 8 Integumentary (Skin) Medical History: Negative for: History of Burn Musculoskeletal Medical History: Positive for: Osteoarthritis Oncologic Medical History: Positive for: Received Radiation Negative for: Received Chemotherapy Past Medical History Notes: hx left breast CA with recurring metastatic disease, hx uterine CA Psychiatric Medical History: Negative for: Anorexia/bulimia; Confinement Anxiety Immunizations Pneumococcal Vaccine: Received Pneumococcal Vaccination: Yes Received Pneumococcal Vaccination On or After  60th Birthday: Yes Implantable Devices Yes Hospitalization / Surgery History Type of Hospitalization/Surgery bilk knee replacements hysterectomy left breast lumpectomy left achilles repair right achilles repair c-section neck surgery-pinched spinal cord Family and Social History Cancer: No; Diabetes: Yes - Paternal Grandparents,Mother,Father; Heart Disease: Yes - Father; Hereditary Spherocytosis: No; Hypertension: Yes - Mother,Father; Kidney Disease: No; Lung Disease: No; Seizures: No; Stroke: No; Thyroid Problems: No; Tuberculosis: No; Never smoker; Marital Status - Divorced; Alcohol Use: Rarely; Drug Use: Prior History - TCH; Caffeine Use: Daily - tea; Financial Concerns: No; Food, Clothing or Shelter Needs: No; Support System Lacking: No; Transportation Concerns: Yes - will have multiple appointments for breast CA Electronic Signature(s) Signed: 10/23/2022 11:39:44 AM By: Kalman Shan DO Entered By: Kalman Shan on 10/23/2022 10:09:08 -------------------------------------------------------------------------------- SuperBill Details Patient Name: Date of Service: [name] 10/23/2022 Medical Record Number:  [id number] Patient Account Number: [id number] Date of Birth/Sex: Treating RN: [birth year] (67 y.o. [name] Primary Care Provider: Cathlean Cower Other Clinician: Referring Provider: Treating Provider/Extender: Hollie Beach in Treatment: 11 Diagnosis Coding ICD-10 Codes Code Description E11.622 Type 2 diabetes mellitus with other skin ulcer KENNICE, FINNIE V ([id number]) [id number] Page 8 of 8 [phone] Non-pressure chronic ulcer of buttock with fat layer exposed Facility Procedures : CPT4 Code: [id number] Description: 99213 - WOUND CARE VISIT-LEV 3 EST PT Modifier: Quantity: 1 Physician Procedures : CPT4 Code Description Modifier [id number] - WC PHYS LEVEL 3 - EST PT ICD-10 Diagnosis Description E11.622 Type 2 diabetes mellitus with other skin ulcer L98.412 Non-pressure chronic ulcer of buttock with fat layer exposed Quantity: 1 Electronic Signature(s) Signed: 10/23/2022 11:39:44 AM By: Kalman Shan DO Entered By: Kalman Shan on 10/23/2022 10:11:11

## 2022-10-24 NOTE — Progress Notes (Signed)
MANIE, BEALER ([id number]) [id number] Page 1 of 9 Visit Report for 10/23/2022 Arrival Information Details Patient Name: Date of Service: [place] [name] 10/23/2022 8:45 A M Medical Record Number: [id number] Patient Account Number: [id number] Date of Birth/Sex: Treating RN: 10-06-1956 (67 y.o. F) Primary Care Nadezhda Pollitt: Cathlean Cower Other Clinician: Referring Willamina Grieshop: Treating Eliakim Tendler/Extender: Hollie Beach in Treatment: 11 Visit Information History Since Last Visit Added or deleted any medications: No Patient Arrived: [name] Any new allergies or adverse reactions: No Arrival Time: 09:05 Had a fall or experienced change in No Accompanied By: self activities of daily living that may affect Transfer Assistance: None risk of falls: Patient Identification Verified: Yes Signs or symptoms of abuse/neglect since last visito No Secondary Verification Process Completed: Yes Hospitalized since last visit: No Patient Requires Transmission-Based Precautions: No Implantable device outside of the clinic excluding No Patient Has Alerts: No cellular tissue based products placed in the center since last visit: Has Dressing in Place as Prescribed: Yes Pain Present Now: No Electronic Signature(s) Signed: 10/23/2022 12:12:32 PM By: Erenest Blank Entered By: Erenest Blank on 10/23/2022 09:06:20 -------------------------------------------------------------------------------- Clinic Level of Care Assessment Details Patient Name: Date of Service: [place] [name] 10/23/2022 8:45 A M Medical Record Number: [id number] Patient Account Number: [id number] Date of Birth/Sex: Treating RN: 17-Mar-1956 (67 y.o. Debby Bud Primary Care Murvin Gift: Cathlean Cower Other Clinician: Referring Angelynn Lemus: Treating Abdon Petrosky/Extender: Hollie Beach in Treatment: 11 Clinic Level of Care Assessment Items TOOL 4 Quantity Score X-  1 0 Use when only an EandM is performed on FOLLOW-UP visit ASSESSMENTS - Nursing Assessment / Reassessment X- 1 10 Reassessment of Co-morbidities (includes updates in patient status) X- 1 5 Reassessment of Adherence to Treatment Plan ASSESSMENTS - Wound and Skin A ssessment / Reassessment X - Simple Wound Assessment / Reassessment - one wound 1 5 '[]'$  - 0 Complex Wound Assessment / Reassessment - multiple wounds X- 1 10 Dermatologic / Skin Assessment (not related to wound area) ASSESSMENTS - Focused Assessment '[]'$  - 0 Circumferential Edema Measurements - multi extremities '[]'$  - 0 Nutritional Assessment / Counseling / Intervention LASONDRA, HODGKINS V ([id number]) [id number] Page 2 of 9 '[]'$  - 0 Lower Extremity Assessment (monofilament, tuning fork, pulses) '[]'$  - 0 Peripheral Arterial Disease Assessment (using hand held doppler) ASSESSMENTS - Ostomy and/or Continence Assessment and Care '[]'$  - 0 Incontinence Assessment and Management '[]'$  - 0 Ostomy Care Assessment and Management (repouching, etc.) PROCESS - Coordination of Care X - Simple Patient / Family Education for ongoing care 1 15 '[]'$  - 0 Complex (extensive) Patient / Family Education for ongoing care X- 1 10 Staff obtains Programmer, systems, Records, T Results / Process Orders est '[]'$  - 0 Staff telephones HHA, Nursing Homes / Clarify orders / etc '[]'$  - 0 Routine Transfer to another Facility (non-emergent condition) '[]'$  - 0 Routine Hospital Admission (non-emergent condition) '[]'$  - 0 New Admissions / Biomedical engineer / Ordering NPWT Apligraf, etc. , '[]'$  - 0 Emergency Hospital Admission (emergent condition) X- 1 10 Simple Discharge Coordination '[]'$  - 0 Complex (extensive) Discharge Coordination PROCESS - Special Needs '[]'$  - 0 Pediatric / Minor Patient Management '[]'$  - 0 Isolation Patient Management '[]'$  - 0 Hearing / Language / Visual special needs '[]'$  - 0 Assessment of Community assistance  (transportation, D/C planning, etc.) '[]'$  - 0 Additional assistance / Altered mentation '[]'$  - 0 Support Surface(s) Assessment (bed, cushion, seat, etc.) INTERVENTIONS - Wound Cleansing / Measurement X - Simple  Wound Cleansing - one wound 1 5 '[]'$  - 0 Complex Wound Cleansing - multiple wounds X- 1 5 Wound Imaging (photographs - any number of wounds) '[]'$  - 0 Wound Tracing (instead of photographs) X- 1 5 Simple Wound Measurement - one wound '[]'$  - 0 Complex Wound Measurement - multiple wounds INTERVENTIONS - Wound Dressings X - Small Wound Dressing one or multiple wounds 1 10 '[]'$  - 0 Medium Wound Dressing one or multiple wounds '[]'$  - 0 Large Wound Dressing one or multiple wounds X- 1 5 Application of Medications - topical '[]'$  - 0 Application of Medications - injection INTERVENTIONS - Miscellaneous '[]'$  - 0 External ear exam '[]'$  - 0 Specimen Collection (cultures, biopsies, blood, body fluids, etc.) '[]'$  - 0 Specimen(s) / Culture(s) sent or taken to Lab for analysis '[]'$  - 0 Patient Transfer (multiple staff / Civil Service fast streamer / Similar devices) '[]'$  - 0 Simple Staple / Suture removal (25 or less) '[]'$  - 0 Complex Staple / Suture removal (26 or more) '[]'$  - 0 Hypo / Hyperglycemic Management (close monitor of Blood Glucose) [name], Roena V ([id number]) [id number] Page 3 of 9 '[]'$  - 0 Ankle / Brachial Index (ABI) - do not check if billed separately X- 1 5 Vital Signs Has the patient been seen at the hospital within the last three years: Yes Total Score: 100 Level Of Care: New/Established - Level 3 Electronic Signature(s) Signed: 10/23/2022 4:55:01 PM By: Deon Pilling RN, BSN Entered By: Deon Pilling on 10/23/2022 09:55:11 -------------------------------------------------------------------------------- Encounter Discharge Information Details Patient Name: Date of Service: [name] 10/23/2022 8:45 A M Medical Record Number: [id number] Patient Account  Number: [id number] Date of Birth/Sex: Treating RN: 1956-05-31 (67 y.o. Helene Shoe, Tammi Klippel Primary Care Axl Rodino: Cathlean Cower Other Clinician: Referring Tanvi Gatling: Treating Destany Severns/Extender: Hollie Beach in Treatment: 11 Encounter Discharge Information Items Discharge Condition: Stable Ambulatory Status: Cane Discharge Destination: Home Transportation: Private Auto Accompanied By: self Schedule Follow-up Appointment: Yes Clinical Summary of Care: Electronic Signature(s) Signed: 10/23/2022 4:55:01 PM By: Deon Pilling RN, BSN Entered By: Deon Pilling on 10/23/2022 09:56:28 -------------------------------------------------------------------------------- Lower Extremity Assessment Details Patient Name: Date of Service: [name] 10/23/2022 8:45 A M Medical Record Number: [id number] Patient Account Number: [id number] Date of Birth/Sex: Treating RN: 03/12/1956 (67 y.o. F) Primary Care Wrenn Willcox: Cathlean Cower Other Clinician: Referring Stephanee Barcomb: Treating Ausha Sieh/Extender: Hollie Beach in Treatment: 11 Electronic Signature(s) Signed: 10/23/2022 12:12:32 PM By: Erenest Blank Entered By: Erenest Blank on 10/23/2022 09:11:59 -------------------------------------------------------------------------------- Multi Wound Chart Details Patient Name: Date of Service: [name] 10/23/2022 8:45 A M Medical Record Number: [id number] Patient Account Number: [id number] AINHOA, RALLO ([id number]) [phone].pdf Page 4 of 9 Date of Birth/Sex: Treating RN: 08-05-1956 (67 y.o. F) Primary Care Atul Delucia: Cathlean Cower Other Clinician: Referring Jaileigh Weimer: Treating Zayonna Ayuso/Extender: Hollie Beach in Treatment: 11 Vital Signs Height(in): Pulse(bpm): 34 Weight(lbs): Blood Pressure(mmHg): 108/68 Body Mass Index(BMI): Temperature(F): 98 Respiratory Rate(breaths/min): 18 [2:Photos:]  [N/A:N/A] Sacrum N/A N/A Wound Location: Gradually Appeared N/A N/A Wounding Event: MASD N/A N/A Primary Etiology: Anemia, Lymphedema, Sleep Apnea, N/A N/A Comorbid History: Hypertension, Type II Diabetes, Osteoarthritis, Received Radiation 10/19/2005 N/A N/A Date Acquired: 11 N/A N/A Weeks of Treatment: Open N/A N/A Wound Status: No N/A N/A Wound Recurrence: 2.5x2x0.1 N/A N/A Measurements L x W x D (cm) 3.927 N/A N/A A (cm) : rea 0.393 N/A N/A Volume (cm) : -38.90% N/A N/A % Reduction in Area: 30.40% N/A  N/A % Reduction in Volume: Full Thickness With Exposed Support N/A N/A Classification: Structures Medium N/A N/A Exudate Amount: Serosanguineous N/A N/A Exudate Type: red, brown N/A N/A Exudate Color: Distinct, outline attached N/A N/A Wound Margin: Large (67-100%) N/A N/A Granulation Amount: Red, Pink N/A N/A Granulation Quality: Small (1-33%) N/A N/A Necrotic Amount: Fat Layer (Subcutaneous Tissue): Yes N/A N/A Exposed Structures: Fascia: No Tendon: No Muscle: No Joint: No Bone: No Small (1-33%) N/A N/A Epithelialization: Excoriation: No N/A N/A Periwound Skin Texture: Induration: No Callus: No Crepitus: No Rash: No Scarring: No Maceration: No N/A N/A Periwound Skin Moisture: Dry/Scaly: No Atrophie Blanche: No N/A N/A Periwound Skin Color: Cyanosis: No Ecchymosis: No Erythema: No Hemosiderin Staining: No Mottled: No Pallor: No Rubor: No No Abnormality N/A N/A Temperature: Yes N/A N/A Tenderness on Palpation: Treatment Notes Wound #2 (Sacrum) Cleanser Anasept Antimicrobial Skin and Wound Cleanser, 8 (oz) Discharge Instruction: Cleanse the wound with Anasept cleanser prior to applying a clean dressing using gauze sponges, not tissue or cotton balls. IRIEL, NASON ([id number]) [id number] Page 5 of 9 Peri-Wound Care Topical Gentamicin Discharge Instruction: As directed by physician; apply only in  clinic. bacitracin Discharge Instruction: use daily at home. Primary Dressing Secondary Dressing Woven Gauze Sponge, Non-Sterile 4x4 in Discharge Instruction: Apply over primary dressing as directed. Secured With Compression Wrap Compression Stockings Environmental education officer) Signed: 10/23/2022 11:39:44 AM By: Kalman Shan DO Entered By: Kalman Shan on 10/23/2022 10:08:18 -------------------------------------------------------------------------------- Multi-Disciplinary Care Plan Details Patient Name: Date of Service: [name] 10/23/2022 8:45 A M Medical Record Number: [id number] Patient Account Number: [id number] Date of Birth/Sex: Treating RN: Apr 04, 1956 (67 y.o. Helene Shoe, Tammi Klippel Primary Care Rooney Gladwin: Cathlean Cower Other Clinician: Referring Kristain Hu: Treating Oaklynn Stierwalt/Extender: Hollie Beach in Treatment: 11 Active Inactive Wound/Skin Impairment Nursing Diagnoses: Impaired tissue integrity Knowledge deficit related to ulceration/compromised skin integrity Goals: Patient will have a decrease in wound volume by X% from date: (specify in notes) Date Initiated: 08/03/2022 Target Resolution Date: 11/20/2022 Goal Status: Active Patient/caregiver will verbalize understanding of skin care regimen Date Initiated: 08/03/2022 Target Resolution Date: 11/20/2022 Goal Status: Active Ulcer/skin breakdown will have a volume reduction of 30% by week 4 Date Initiated: 08/03/2022 Date Inactivated: 10/23/2022 Target Resolution Date: 10/16/2022 Unmet Reason: continue wound care. Goal Status: Unmet See Patient wound measurement . Interventions: Assess patient/caregiver ability to obtain necessary supplies Assess patient/caregiver ability to perform ulcer/skin care regimen upon admission and as needed Assess ulceration(s) every visit Notes: Electronic Signature(s) TALAYSHA, FREEBERG V ([id number]) [id number] Page 6 of  9 Signed: 10/23/2022 4:55:01 PM By: Deon Pilling RN, BSN Entered By: Deon Pilling on 10/23/2022 09:39:30 -------------------------------------------------------------------------------- Pain Assessment Details Patient Name: Date of Service: [name] 10/23/2022 8:45 A M Medical Record Number: [id number] Patient Account Number: [id number] Date of Birth/Sex: Treating RN: [birth year] (67 y.o. F) Primary Care Becci Batty: Cathlean Cower Other Clinician: Referring Yuma Blucher: Treating Kaedon Fanelli/Extender: Hollie Beach in Treatment: 11 Active Problems Location of Pain Severity and Description of Pain Patient Has Paino No Site Locations Pain Management and Medication Current Pain Management: Electronic Signature(s) Signed: 10/23/2022 12:12:32 PM By: Erenest Blank Entered By: Erenest Blank on 10/23/2022 09:11:29 -------------------------------------------------------------------------------- Patient/Caregiver Education Details Patient Name: Date of Service: Gonzella Lex 1/5/2024andnbsp8:45 A M Medical Record Number: [id number] Patient Account Number: [id number] Date of Birth/Gender: Treating RN: Mar 09, 1956 (67 y.o. Helene Shoe, Tammi Klippel Primary Care Physician: Cathlean Cower Other Clinician: Referring Physician: Treating Physician/Extender: Ihor Dow,  Casper Harrison in Treatment: 11 Education Assessment Education Provided To: Patient LYNIAH, FUJITA ([id number]) [id number] Page 7 of 9 Education Topics Provided Wound/Skin Impairment: Handouts: Caring for Your Ulcer Methods: Explain/Verbal Responses: Reinforcements needed Electronic Signature(s) Signed: 10/23/2022 4:55:01 PM By: Deon Pilling RN, BSN Entered By: Deon Pilling on 10/23/2022 09:48:50 -------------------------------------------------------------------------------- Wound Assessment Details Patient Name: Date of Service: [name]  10/23/2022 8:45 A M Medical Record Number: [id number] Patient Account Number: [id number] Date of Birth/Sex: Treating RN: Dec 10, 1955 (67 y.o. F) Primary Care Mersedes Alber: Cathlean Cower Other Clinician: Referring Kaneshia Cater: Treating Jodean Valade/Extender: Hollie Beach in Treatment: 11 Wound Status Wound Number: 2 Primary MASD Etiology: Wound Location: Sacrum Wound Open Wounding Event: Gradually Appeared Status: Date Acquired: 10/19/2005 Comorbid Anemia, Lymphedema, Sleep Apnea, Hypertension, Type II Weeks Of Treatment: 11 History: Diabetes, Osteoarthritis, Received Radiation Clustered Wound: No Photos Wound Measurements Length: (cm) 2.5 Width: (cm) 2 Depth: (cm) 0.1 Area: (cm) 3.927 Volume: (cm) 0.393 % Reduction in Area: -38.9% % Reduction in Volume: 30.4% Epithelialization: Small (1-33%) Tunneling: No Undermining: No Wound Description Classification: Full Thickness With Exposed Suppor Wound Margin: Distinct, outline attached Exudate Amount: Medium Exudate Type: Serosanguineous Exudate Color: red, brown t Structures Foul Odor After Cleansing: No Slough/Fibrino Yes Wound Bed Granulation Amount: Large (67-100%) Exposed Structure Granulation Quality: Red, Pink Fascia Exposed: No Necrotic Amount: Small (1-33%) Fat Layer (Subcutaneous Tissue) Exposed: Yes Tendon Exposed: No Muscle Exposed: No Joint Exposed: No Bone Exposed: No [name], Tyeshia V ([id number]) [id number].pdf Page 8 of 9 Periwound Skin Texture Texture Color No Abnormalities Noted: No No Abnormalities Noted: No Callus: No Atrophie Blanche: No Crepitus: No Cyanosis: No Excoriation: No Ecchymosis: No Induration: No Erythema: No Rash: No Hemosiderin Staining: No Scarring: No Mottled: No Pallor: No Moisture Rubor: No No Abnormalities Noted: No Dry / Scaly: No Temperature / Pain Maceration: No Temperature: No Abnormality Tenderness on Palpation:  Yes Treatment Notes Wound #2 (Sacrum) Cleanser Anasept Antimicrobial Skin and Wound Cleanser, 8 (oz) Discharge Instruction: Cleanse the wound with Anasept cleanser prior to applying a clean dressing using gauze sponges, not tissue or cotton balls. Peri-Wound Care Topical Gentamicin Discharge Instruction: As directed by physician; apply only in clinic. bacitracin Discharge Instruction: use daily at home. Primary Dressing Secondary Dressing Woven Gauze Sponge, Non-Sterile 4x4 in Discharge Instruction: Apply over primary dressing as directed. Secured With Compression Wrap Compression Stockings Environmental education officer) Signed: 10/23/2022 12:12:32 PM By: Erenest Blank Entered By: Erenest Blank on 10/23/2022 09:17:54 -------------------------------------------------------------------------------- Vitals Details Patient Name: Date of Service: [name] 10/23/2022 8:45 A M Medical Record Number: [id number] Patient Account Number: [id number] Date of Birth/Sex: Treating RN: [birth year] (66 y.o. F) Primary Care Zechariah Bissonnette: Cathlean Cower Other Clinician: Referring Jarod Bozzo: Treating Kaysen Deal/Extender: Hollie Beach in Treatment: 11 Vital Signs Time Taken: 09:06 Temperature (F): 98 Pulse (bpm): 72 Respiratory Rate (breaths/min): 18 Blood Pressure (mmHg): 108/68 Reference Range: 80 - 120 mg / dl BAILY, HOVANEC V ([id number]) [phone].pdf Page 9 of 9 Electronic Signature(s) Signed: 10/23/2022 12:12:32 PM By: Erenest Blank Entered By: Erenest Blank on 10/23/2022 09:11:13

## 2022-10-27 ENCOUNTER — Other Ambulatory Visit: Payer: Self-pay | Admitting: *Deleted

## 2022-10-27 ENCOUNTER — Other Ambulatory Visit (INDEPENDENT_AMBULATORY_CARE_PROVIDER_SITE_OTHER): Payer: Medicare Other

## 2022-10-27 ENCOUNTER — Ambulatory Visit (INDEPENDENT_AMBULATORY_CARE_PROVIDER_SITE_OTHER): Payer: Medicare Other | Admitting: Gastroenterology

## 2022-10-27 ENCOUNTER — Encounter: Payer: Self-pay | Admitting: Gastroenterology

## 2022-10-27 VITALS — BP 128/60 | HR 70 | Ht 59.0 in | Wt 240.0 lb

## 2022-10-27 DIAGNOSIS — R194 Change in bowel habit: Secondary | ICD-10-CM | POA: Diagnosis not present

## 2022-10-27 DIAGNOSIS — Z853 Personal history of malignant neoplasm of breast: Secondary | ICD-10-CM | POA: Diagnosis not present

## 2022-10-27 DIAGNOSIS — D649 Anemia, unspecified: Secondary | ICD-10-CM

## 2022-10-27 LAB — IBC + FERRITIN
Ferritin: 333.1 ng/mL — ABNORMAL HIGH (ref 10.0–291.0)
Iron: 67 ug/dL (ref 42–145)
Saturation Ratios: 22.4 % (ref 20.0–50.0)
TIBC: 299.6 ug/dL (ref 250.0–450.0)
Transferrin: 214 mg/dL (ref 212.0–360.0)

## 2022-10-27 MED ORDER — CITRUCEL PO POWD: 1.0000 | Freq: Every day | ORAL | Status: DC
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
## 2022-10-27 NOTE — Patient Instructions (Addendum)
If you are age 65 or older, your body mass index should be between 23-30. Your Body mass index is 48.47 kg/m. If this is out of the aforementioned range listed, please consider follow up with your Primary Care Provider.  If you are age 89 or younger, your body mass index should be between 19-25. Your Body mass index is 48.47 kg/m. If this is out of the aformentioned range listed, please consider follow up with your Primary Care Provider.   ________________________________________________________   Please purchase the following medications over the counter and take as directed:  Citrucel - once daily  Please go to the lab in the basement of our building to have lab work done as you leave today. Hit "B" for basement when you get on the elevator.  When the doors open the lab is on your left.  We will call you with the results. Thank you.   Thank you for entrusting me with your care and for choosing [facility], Dr.  Cellar

## 2022-11-04 NOTE — Progress Notes (Signed)
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    Breast cancer Paternal Aunt    Cancer Maternal Grandmother        salivary gland cancer   Colon cancer Neg Hx    Colon polyps Neg Hx    Esophageal cancer Neg Hx    Stomach cancer Neg Hx    Rectal cancer Neg Hx     Review of Systems  Constitutional:  Positive for fatigue. Negative for appetite change, chills, fever and unexpected weight change.  HENT:   Negative for hearing loss, lump/mass  and trouble swallowing.   Eyes:  Negative for eye problems and icterus.  Respiratory:  Negative for chest tightness, cough and shortness of breath.   Cardiovascular:  Negative for chest pain, leg swelling and palpitations.  Gastrointestinal:  Negative for abdominal distention, abdominal pain, constipation, diarrhea, nausea and vomiting.  Endocrine: Negative for hot flashes.  Genitourinary:  Negative for difficulty urinating.   Musculoskeletal:  Positive for back pain (chronic, seeing neurosurgery/pain management). Negative for  arthralgias.  Skin:  Negative for itching and rash.  Neurological:  Positive for numbness (chronic, improved after stopping Kadcyla, however also has diabetes). Negative for dizziness, extremity weakness and headaches.  Hematological:  Negative for adenopathy. Does not bruise/bleed easily.  Psychiatric/Behavioral:  Negative for depression. The patient is not nervous/anxious.       PHYSICAL EXAMINATION  ECOG PERFORMANCE STATUS: 1 - Symptomatic but completely ambulatory  Vitals:   11/05/22 0915  BP: (!) 142/89  Pulse: 64  Resp: 16  Temp: 97.7 F (36.5 C)  SpO2: 100%     Physical Exam Constitutional:      General: She is not in acute distress.    Appearance: Normal appearance. She is not toxic-appearing.  HENT:     Head: Normocephalic and atraumatic.  Eyes:     General: No scleral icterus. Cardiovascular:     Rate and Rhythm: Normal rate and regular rhythm.     Pulses: Normal pulses.     Heart sounds: Normal heart sounds.  Pulmonary:     Effort: Pulmonary effort is normal.     Breath sounds: Normal breath sounds.  Abdominal:     General: Abdomen is flat. Bowel sounds are normal. There is no distension.     Palpations: Abdomen is soft.     Tenderness: There is no abdominal tenderness.  Musculoskeletal:        General: No swelling.     Cervical back: Neck supple.  Lymphadenopathy:     Cervical: No cervical adenopathy.  Skin:    General: Skin is warm and dry.     Findings: No rash.  Neurological:     General: No focal deficit present.     Mental Status: She is alert.  Psychiatric:        Mood and Affect: Mood normal.        Behavior: Behavior normal.     LABORATORY DATA:  CBC    Component Value Date/Time   WBC 5.9 11/05/2022 0854   WBC 6.4 09/27/2020 1114   RBC 3.68 (L) 11/05/2022 0854   HGB 10.7 (L) 11/05/2022 0854   HGB 11.9 03/30/2017 1044   HCT 33.0 (L) 11/05/2022 0854   HCT 37.4 03/30/2017 1044   PLT 201 11/05/2022 0854   PLT 238 03/30/2017 1044    MCV 89.7 11/05/2022 0854   MCV 85.0 03/30/2017 1044   MCH 29.1 11/05/2022 0854   MCHC 32.4 11/05/2022 0854   RDW 13.9 11/05/2022 0854   RDW 15.1 (H) 03/30/2017 1044   LYMPHSABS 1.4 11/05/2022 0854   LYMPHSABS 1.3 03/30/2017 1044   MONOABS 0.7 11/05/2022 0854   MONOABS 0.5 03/30/2017 1044   EOSABS 0.1 11/05/2022 0854   EOSABS 0.0 03/30/2017 1044   BASOSABS 0.0 11/05/2022 0854   BASOSABS 0.0 03/30/2017 1044    CMP     Component Value Date/Time   NA 140 10/15/2022 0831   NA 143 03/30/2017 1044   K 3.7 10/15/2022 0831   K 3.8 03/30/2017 1044   CL 105 10/15/2022 0831  CL 106 01/18/2013 0909   CO2 28 10/15/2022 0831   CO2 27 03/30/2017 1044   GLUCOSE 108 (H) 10/15/2022 0831   GLUCOSE 91 03/30/2017 1044   GLUCOSE 99 01/18/2013 0909   BUN 23 10/15/2022 0831   BUN 9.7 03/30/2017 1044   CREATININE 0.90 10/15/2022 0831   CREATININE 0.8 03/30/2017 1044   CALCIUM 9.3 10/15/2022 0831   CALCIUM 9.7 03/30/2017 1044   PROT 8.0 10/15/2022 0831   PROT 8.0 03/30/2017 1044   ALBUMIN 3.7 10/15/2022 0831   ALBUMIN 3.8 01/27/2021 1232   ALBUMIN 3.4 (L) 03/30/2017 1044   AST 19 10/15/2022 0831   AST 17 03/30/2017 1044   ALT 15 10/15/2022 0831   ALT 15 03/30/2017 1044   ALKPHOS 46 10/15/2022 0831   ALKPHOS 60 03/30/2017 1044   BILITOT 0.3 10/15/2022 0831   BILITOT 0.34 03/30/2017 1044   GFRNONAA >60 10/15/2022 0831   GFRAA >60 07/11/2020 0923         ASSESSMENT and THERAPY PLAN:   Malignant neoplasm of central portion of left breast in female, estrogen receptor negative (HCC) [name] is a 67 year old woman with history of metastatic breast cancer currently on treatment with Herceptin only.  [name] is doing well today.  She has no clinical signs of progression of metastatic breast cancer.  We reviewed her neuropathy and these appear to be related to her comorbidities.    [name] will continue on Herceptin every 3 weeks and we will see her back in 6 weeks for labs follow-up and  her next appointment.  She appears to be due for restaging in February 2024.  RTC in 6 weeks with [place]. She will continue follow up with cardiology for ECHO and cardiac monitoring.   All questions were answered. The patient knows to call the clinic with any problems, questions or concerns. We can certainly see the patient much sooner if necessary.  Total encounter time:30 minutes*in face-to-face visit time, chart review, lab review, care coordination, order entry, and documentation of the encounter time.  *Total Encounter Time as defined by the Centers for Medicare and Medicaid Services includes, in addition to the face-to-face time of a patient visit (documented in the note above) non-face-to-face time: obtaining and reviewing outside history, ordering and reviewing medications, tests or procedures, care coordination (communications with other health care professionals or caregivers) and documentation in the medical record.

## 2022-11-04 NOTE — Assessment & Plan Note (Addendum)
[name] is a 67 year old woman with history of metastatic breast cancer currently on treatment with Herceptin only.  [name] is doing well today.  She has no clinical signs of progression of metastatic breast cancer.  We reviewed her neuropathy and these appear to be related to her comorbidities.    [name] will continue on Herceptin every 3 weeks and we will see her back in 6 weeks for labs follow-up and her next appointment.  She appears to be due for restaging in February 2024.  This has been ordered.

## 2022-11-05 ENCOUNTER — Other Ambulatory Visit (HOSPITAL_COMMUNITY): Payer: Self-pay | Admitting: Family Medicine

## 2022-11-05 ENCOUNTER — Inpatient Hospital Stay: Payer: Medicare Other | Attending: Hematology and Oncology | Admitting: Hematology and Oncology

## 2022-11-05 ENCOUNTER — Other Ambulatory Visit: Payer: Self-pay

## 2022-11-05 ENCOUNTER — Inpatient Hospital Stay: Payer: Medicare Other

## 2022-11-05 ENCOUNTER — Encounter: Payer: Self-pay | Admitting: Hematology and Oncology

## 2022-11-05 VITALS — BP 142/89 | HR 64 | Temp 97.7°F | Resp 16 | Ht 59.0 in | Wt 236.6 lb

## 2022-11-05 VITALS — BP 112/66 | HR 60 | Temp 97.5°F | Resp 16

## 2022-11-05 DIAGNOSIS — Z90710 Acquired absence of both cervix and uterus: Secondary | ICD-10-CM | POA: Diagnosis not present

## 2022-11-05 DIAGNOSIS — C50112 Malignant neoplasm of central portion of left female breast: Secondary | ICD-10-CM

## 2022-11-05 DIAGNOSIS — Z79899 Other long term (current) drug therapy: Secondary | ICD-10-CM | POA: Diagnosis not present

## 2022-11-05 DIAGNOSIS — C50812 Malignant neoplasm of overlapping sites of left female breast: Secondary | ICD-10-CM | POA: Insufficient documentation

## 2022-11-05 DIAGNOSIS — Z5112 Encounter for antineoplastic immunotherapy: Secondary | ICD-10-CM | POA: Insufficient documentation

## 2022-11-05 DIAGNOSIS — Z171 Estrogen receptor negative status [ER-]: Secondary | ICD-10-CM | POA: Diagnosis not present

## 2022-11-05 DIAGNOSIS — Z95828 Presence of other vascular implants and grafts: Secondary | ICD-10-CM

## 2022-11-05 DIAGNOSIS — Z9079 Acquired absence of other genital organ(s): Secondary | ICD-10-CM | POA: Insufficient documentation

## 2022-11-05 DIAGNOSIS — Z90722 Acquired absence of ovaries, bilateral: Secondary | ICD-10-CM | POA: Diagnosis not present

## 2022-11-05 DIAGNOSIS — G629 Polyneuropathy, unspecified: Secondary | ICD-10-CM | POA: Insufficient documentation

## 2022-11-05 DIAGNOSIS — Z8542 Personal history of malignant neoplasm of other parts of uterus: Secondary | ICD-10-CM | POA: Insufficient documentation

## 2022-11-05 DIAGNOSIS — C773 Secondary and unspecified malignant neoplasm of axilla and upper limb lymph nodes: Secondary | ICD-10-CM | POA: Diagnosis present

## 2022-11-05 LAB — CMP (CANCER CENTER ONLY)
ALT: 17 U/L (ref 0–44)
AST: 21 U/L (ref 15–41)
Albumin: 3.8 g/dL (ref 3.5–5.0)
Alkaline Phosphatase: 47 U/L (ref 38–126)
Anion gap: 7 (ref 5–15)
BUN: 29 mg/dL — ABNORMAL HIGH (ref 8–23)
CO2: 28 mmol/L (ref 22–32)
Calcium: 10 mg/dL (ref 8.9–10.3)
Chloride: 104 mmol/L (ref 98–111)
Creatinine: 1.2 mg/dL — ABNORMAL HIGH (ref 0.44–1.00)
GFR, Estimated: 51 mL/min — ABNORMAL LOW (ref >60)
Glucose, Bld: 89 mg/dL (ref 70–99)
Potassium: 3.6 mmol/L (ref 3.5–5.1)
Sodium: 139 mmol/L (ref 135–145)
Total Bilirubin: 0.4 mg/dL (ref 0.3–1.2)
Total Protein: 8 g/dL (ref 6.5–8.1)

## 2022-11-05 LAB — CBC WITH DIFFERENTIAL (CANCER CENTER ONLY)
Abs Immature Granulocytes: 0.02 10*3/uL (ref 0.00–0.07)
Basophils Absolute: 0 10*3/uL (ref 0.0–0.1)
Basophils Relative: 0 %
Eosinophils Absolute: 0.1 10*3/uL (ref 0.0–0.5)
Eosinophils Relative: 1 %
HCT: 33 % — ABNORMAL LOW (ref 36.0–46.0)
Hemoglobin: 10.7 g/dL — ABNORMAL LOW (ref 12.0–15.0)
Immature Granulocytes: 0 %
Lymphocytes Relative: 24 %
Lymphs Abs: 1.4 10*3/uL (ref 0.7–4.0)
MCH: 29.1 pg (ref 26.0–34.0)
MCHC: 32.4 g/dL (ref 30.0–36.0)
MCV: 89.7 fL (ref 80.0–100.0)
Monocytes Absolute: 0.7 10*3/uL (ref 0.1–1.0)
Monocytes Relative: 12 %
Neutro Abs: 3.7 10*3/uL (ref 1.7–7.7)
Neutrophils Relative %: 63 %
Platelet Count: 201 10*3/uL (ref 150–400)
RBC: 3.68 MIL/uL — ABNORMAL LOW (ref 3.87–5.11)
RDW: 13.9 % (ref 11.5–15.5)
WBC Count: 5.9 10*3/uL (ref 4.0–10.5)
nRBC: 0 % (ref 0.0–0.2)

## 2022-11-05 MED ORDER — SODIUM CHLORIDE 0.9% FLUSH
10.0000 mL | INTRAVENOUS | Status: DC | PRN
  Administered 2022-11-05 (×2): 10 mL

## 2022-11-05 MED ORDER — SODIUM CHLORIDE 0.9 % IV SOLN: Freq: Once | INTRAVENOUS | Status: AC

## 2022-11-05 MED ORDER — HEPARIN SOD (PORK) LOCK FLUSH 100 UNIT/ML IV SOLN
500.0000 [IU] | Freq: Once | INTRAVENOUS | Status: AC | PRN
  Administered 2022-11-05: 500 [IU]

## 2022-11-05 MED ORDER — DIPHENHYDRAMINE HCL 25 MG PO CAPS
25.0000 mg | ORAL_CAPSULE | Freq: Once | ORAL | Status: AC
  Administered 2022-11-05: 25 mg via ORAL
  Filled 2022-11-05: qty 1

## 2022-11-05 MED ORDER — TRASTUZUMAB-ANNS CHEMO 150 MG IV SOLR
6.0000 mg/kg | Freq: Once | INTRAVENOUS | Status: AC
  Administered 2022-11-05: 600 mg via INTRAVENOUS
  Filled 2022-11-05: qty 28.57

## 2022-11-05 MED ORDER — SODIUM CHLORIDE 0.9% FLUSH
10.0000 mL | Freq: Once | INTRAVENOUS | Status: AC
  Administered 2022-11-05: 10 mL

## 2022-11-05 MED ORDER — ACETAMINOPHEN 325 MG PO TABS
650.0000 mg | ORAL_TABLET | Freq: Once | ORAL | Status: AC
  Administered 2022-11-05: 650 mg via ORAL
  Filled 2022-11-05: qty 2

## 2022-11-05 NOTE — Patient Instructions (Signed)
[facility]  Discharge Instructions: Thank you for choosing [facility] to provide your oncology and hematology care.   If you have a lab appointment with the [facility], please go directly to the [facility] and check in at the registration area.   Wear comfortable clothing and clothing appropriate for easy access to any Portacath or PICC line.   We strive to give you quality time with your provider. You may need to reschedule your appointment if you arrive late (15 or more minutes).  Arriving late affects you and other patients whose appointments are after yours.  Also, if you miss three or more appointments without notifying the office, you may be dismissed from the clinic at the provider's discretion.      For prescription refill requests, have your pharmacy contact our office and allow 72 hours for refills to be completed.    Today you received the following chemotherapy and/or immunotherapy agent: Trastuzumab (Kanjinti)    To help prevent nausea and vomiting after your treatment, we encourage you to take your nausea medication as directed.  BELOW ARE SYMPTOMS THAT SHOULD BE REPORTED IMMEDIATELY: *FEVER GREATER THAN 100.4 F (38 C) OR HIGHER *CHILLS OR SWEATING *NAUSEA AND VOMITING THAT IS NOT CONTROLLED WITH YOUR NAUSEA MEDICATION *UNUSUAL SHORTNESS OF BREATH *UNUSUAL BRUISING OR BLEEDING *URINARY PROBLEMS (pain or burning when urinating, or frequent urination) *BOWEL PROBLEMS (unusual diarrhea, constipation, pain near the anus) TENDERNESS IN MOUTH AND THROAT WITH OR WITHOUT PRESENCE OF ULCERS (sore throat, sores in mouth, or a toothache) UNUSUAL RASH, SWELLING OR PAIN  UNUSUAL VAGINAL DISCHARGE OR ITCHING   Items with * indicate a potential emergency and should be followed up as soon as possible or go to the Emergency Department if any problems should occur.  Please show the CHEMOTHERAPY ALERT CARD or IMMUNOTHERAPY ALERT CARD at  check-in to the Emergency Department and triage nurse.  Should you have questions after your visit or need to cancel or reschedule your appointment, please contact [facility]  Dept: [phone]  and follow the prompts.  Office hours are 8:00 a.m. to 4:30 p.m. Monday - Friday. Please note that voicemails left after 4:00 p.m. may not be returned until the following business day.  We are closed weekends and major holidays. You have access to a nurse at all times for urgent questions. Please call the main number to the clinic Dept: [phone] and follow the prompts.   For any non-urgent questions, you may also contact your provider using MyChart. We now offer e-Visits for anyone 26 and older to request care online for non-urgent symptoms. For details visit [email or web address].   Also download the MyChart app! Go to the app store, search "MyChart", open the app, select [facility], and log in with your MyChart username and password.  Trastuzumab Injection What is this medication? TRASTUZUMAB (tras TOO zoo mab) treats breast cancer and stomach cancer. It works by blocking a protein that causes cancer cells to grow and multiply. This helps to slow or stop the spread of cancer cells. This medicine may be used for other purposes; ask your health care provider or pharmacist if you have questions. COMMON BRAND NAME(S): Herceptin, Janae Bridgeman, Ontruzant, Trazimera What should I tell my care team before I take this medication? They need to know if you have any of these conditions: Heart failure Lung disease An unusual or allergic reaction to trastuzumab, other medications, foods, dyes, or preservatives Pregnant  or trying to get pregnant Breast-feeding How should I use this medication? This medication is injected into a vein. It is given by your care team in a hospital or clinic setting. Talk to your care team about the use of this medication in children.  It is not approved for use in children. Overdosage: If you think you have taken too much of this medicine contact a poison control center or emergency room at once. NOTE: This medicine is only for you. Do not share this medicine with others. What if I miss a dose? Keep appointments for follow-up doses. It is important not to miss your dose. Call your care team if you are unable to keep an appointment. What may interact with this medication? Certain types of chemotherapy, such as daunorubicin, doxorubicin, epirubicin, idarubicin This list may not describe all possible interactions. Give your health care provider a list of all the medicines, herbs, non-prescription drugs, or dietary supplements you use. Also tell them if you smoke, drink alcohol, or use illegal drugs. Some items may interact with your medicine. What should I watch for while using this medication? Your condition will be monitored carefully while you are receiving this medication. This medication may make you feel generally unwell. This is not uncommon, as chemotherapy affects healthy cells as well as cancer cells. Report any side effects. Continue your course of treatment even though you feel ill unless your care team tells you to stop. This medication may increase your risk of getting an infection. Call your care team for advice if you get a fever, chills, sore throat, or other symptoms of a cold or flu. Do not treat yourself. Try to avoid being around people who are sick. Avoid taking medications that contain aspirin, acetaminophen, ibuprofen, naproxen, or ketoprofen unless instructed by your care team. These medications can hide a fever. Talk to your care team if you may be pregnant. Serious birth defects can occur if you take this medication during pregnancy and for 7 months after the last dose. You will need a negative pregnancy test before starting this medication. Contraception is recommended while taking this medication and for 7  months after the last dose. Your care team can help you find the option that works for you. Do not breastfeed while taking this medication and for 7 months after stopping treatment. What side effects may I notice from receiving this medication? Side effects that you should report to your care team as soon as possible: Allergic reactions or angioedema--skin rash, itching or hives, swelling of the face, eyes, lips, tongue, arms, or legs, trouble swallowing or breathing Dry cough, shortness of breath or trouble breathing Heart failure--shortness of breath, swelling of the ankles, feet, or hands, sudden weight gain, unusual weakness or fatigue Infection--fever, chills, cough, or sore throat Infusion reactions--chest pain, shortness of breath or trouble breathing, feeling faint or lightheaded Side effects that usually do not require medical attention (report to your care team if they continue or are bothersome): Diarrhea Dizziness Headache Nausea Trouble sleeping Vomiting This list may not describe all possible side effects. Call your doctor for medical advice about side effects. You may report side effects to FDA at 1-800-FDA-1088. Where should I keep my medication? This medication is given in a hospital or clinic. It will not be stored at home. NOTE: This sheet is a summary. It may not cover all possible information. If you have questions about this medicine, talk to your doctor, pharmacist, or health care provider.  2023 Elsevier/Gold Standard (  2022-02-05 00:00:00)   

## 2022-11-06 ENCOUNTER — Other Ambulatory Visit: Payer: Self-pay

## 2022-11-06 ENCOUNTER — Encounter (HOSPITAL_BASED_OUTPATIENT_CLINIC_OR_DEPARTMENT_OTHER): Payer: Medicare Other | Admitting: Internal Medicine

## 2022-11-06 DIAGNOSIS — E11622 Type 2 diabetes mellitus with other skin ulcer: Secondary | ICD-10-CM | POA: Diagnosis not present

## 2022-11-06 DIAGNOSIS — L98412 Non-pressure chronic ulcer of buttock with fat layer exposed: Secondary | ICD-10-CM | POA: Diagnosis not present

## 2022-11-06 NOTE — Progress Notes (Signed)
Jacqueline, Young ([id number]) [id number] Page 1 of 8 Visit Report for 11/06/2022 Chief Complaint Document Details Patient Name: Date of Service: [name] 11/06/2022 8:45 A M Medical Record Number: [id number] Patient Account Number: [id number] Date of Birth/Sex: Treating RN: 1955/11/06 (67 y.o. F) Primary Care Provider: Cathlean Cower Other Clinician: Referring Provider: Treating Provider/Extender: Hollie Beach in Treatment: 13 Information Obtained from: Patient Chief Complaint 08/03/2022; several year history of waxing and waning to a wound to the intergluteal cleft Electronic Signature(s) Signed: 11/06/2022 9:57:45 AM By: Kalman Shan DO Entered By: Kalman Shan on 11/06/2022 09:52:55 -------------------------------------------------------------------------------- HPI Details Patient Name: Date of Service: Jacqueline Lyons V. 11/06/2022 8:45 A M Medical Record Number: [id number] Patient Account Number: [id number] Date of Birth/Sex: Treating RN: 1955/11/02 (67 y.o. F) Primary Care Provider: Cathlean Cower Other Clinician: Referring Provider: Treating Provider/Extender: Hollie Beach in Treatment: 13 History of Present Illness HPI Description: 11/02/2019 ADMISSION This is a 67 year old woman who has a wound on the right posterior Achilles area towards the distal end of the Achilles tendon surgery scars. She tells me that roughly 2-1/2 to 3 weeks ago she was picking at some dry skin in the area pulled it off and that is how the wound opened up. She had an original spontaneous Achilles tendon rupture in the early 1990s that was repaired and then very shortly thereafter it reruptured and she required another surgery. She has not had any problems with this since then. The wound itself currently has episodic sharp pain some swelling. She was put on Augmentin by her primary physician  on 10/30/2019 for possible coexistent cellulitis. She used hydrogen peroxide for a while with topical antibiotics. Mostly she has been using topical antibiotics as somebody told her that hydrogen peroxide was not good to use. 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 Surgery was in [place], bilateral total knee replacements. ABIs in our clinic on the right were 1.09 1/21; patient here with a linear wound in the right Achilles area. Her wrap fell down she did not call us to replace it. She has questions about her compression wrap necessity. Finally she asked if the wound could be sutured The patient is under a lot of pressure with initiation of chemotherapy she is going to have a port placed etc. 1/28; linear wound in the right Achilles area in the setting of previous surgery. She is going for a port for chemotherapy for her breast cancer. She has apparently made herself an appointment with orthopedic surgery. I am not sure who she has made an appointment with at this point. 2/12 linear wound in the right Achilles is come in in terms of length. This is in the setting of previous surgery in this area The patient is undergoing chemotherapy for breast cancer and having significant side effects [diarrhea] I am trying not to bring in her in too often as long as her wound is contracted 3/5; linear wound in the right Achilles. Not quite as long however still some measurable depth. She tells [place] that she has had problems with chemotherapy also apparently pseudomembranous colitis. She has not had much attention to paid to the wound. Came in without any  dressing on etc. 4/16; right Achilles. She is completely closed 08/03/2022 Ms.  Harvest Young is a 67 year old female with a past medical history of left sided breast cancer currently on chemotherapy, morbid obesity and MAKINZEY, BANES V ([id number]) [id number] Page 2 of 8 currently controlled type 2 diabetes that presents the clinic for a wound to her intergluteal cleft. She states that this is an area that [place] and wanes in healing. She does report sitting for most of the day. She has been using zinc oxide to the area. She currently denies signs of infection. 10/23; patient presents for follow-up. She has been using AandD ointment to the wound bed. She did not receive her supplies from [place]. She has no issues or complaints today. 11/17; patient missed her last clinic appointment. She states she has been using AandD ointment to the wound bed along with silver alginate. She has no issues or complaints today. 12/1; patient presents for follow-up. Patient states she has been very busy over the holidays and has been unable to use the AandD ointment or silver alginate to the wound area. 12/21; patient presents for follow-up. She has been using AandD ointment to the wound bed. She states it is tough to tend to the wound every day. 1/5; patient presents for follow-up. She has been using bacitracin ointment to the wound bed. She purchased Anasept spray and has been using this to clean the wound bed. She has no issues or complaints today. 1/19; patient presents for follow-up. She has been using bacitracin ointment to the wound bed and Anasept spray to cleanse the wound between dressing changes. She has no issues or complaints today. There is been improvement in wound healing. Electronic Signature(s) Signed: 11/06/2022 9:57:45 AM By: Kalman Shan DO Entered By: Kalman Shan on 11/06/2022 09:54:22 -------------------------------------------------------------------------------- Physical Exam Details Patient Name: Date of Service: Jacqueline Lyons V. 11/06/2022 8:45 A M Medical Record Number: [id number] Patient Account Number: [id number] Date of Birth/Sex: Treating RN: 1955-12-13 (67 y.o. F) Primary Care Provider: Cathlean Cower Other Clinician: Referring Provider: Treating Provider/Extender: Hollie Beach in Treatment: 13 Constitutional respirations regular, non-labored and within target range for patient.[name] Psychiatric pleasant and cooperative. Notes T the intergluteal cleft there is an open wound limited to skin breakdown o Electronic Signature(s) Signed: 11/06/2022 9:57:45 AM By: Kalman Shan DO Entered By: Kalman Shan on 11/06/2022 09:54:51 -------------------------------------------------------------------------------- Physician Orders Details Patient Name: Date of Service: [name], [name] 11/06/2022 8:45 A M Medical Record Number: [id number] Patient Account Number: [id number] Date of Birth/Sex: Treating RN: 04/25/1956 (67 y.o. [name], [name] Provider: Cathlean Cower Other Clinician: Referring Provider: Treating Provider/Extender: Hollie Beach in Treatment: 13 Verbal / Phone Orders: No Diagnosis Coding Follow-up Appointments Return appointment in 1 month. - w/ Dr. Ruben Gottron, Lunette Stands ([id number]) [id number] Page 3 of 8 Bathing/ Shower/ Hygiene May shower and wash wound with soap and water. - use anacept spray cleanse wound after shower. Off-Loading Turn and reposition every 2 hours Wound Treatment Wound #2 - Sacrum Cleanser: Anasept Antimicrobial Skin and Wound Cleanser, 8 (oz) 1 x Per Day/30 Days Discharge Instructions: Cleanse the wound with Anasept cleanser prior to applying a clean dressing using gauze sponges, not tissue or cotton balls. Topical: Gentamicin 1 x Per Day/30 Days Discharge Instructions: As directed by physician; apply only in clinic. Topical: bacitracin 1 x Per Day/30  Days Discharge Instructions: use daily at home. Secondary Dressing: Woven Gauze Sponge, Non-Sterile 4x4  in (Generic) 1 x Per Day/30 Days Discharge Instructions: Apply over primary dressing as directed. Electronic Signature(s) Signed: 11/06/2022 9:57:45 AM By: Kalman Shan DO Entered By: Kalman Shan on 11/06/2022 09:54:58 -------------------------------------------------------------------------------- Problem List Details Patient Name: Date of Service: [name], [name] 11/06/2022 8:45 A M Medical Record Number: [id number] Patient Account Number: [id number] Date of Birth/Sex: Treating RN: 12/05/1955 (67 y.o. F) Primary Care Provider: Cathlean Cower Other Clinician: Referring Provider: Treating Provider/Extender: Hollie Beach in Treatment: 13 Active Problems ICD-10 Encounter Code Description Active Date MDM Diagnosis E11.622 Type 2 diabetes mellitus with other skin ulcer 08/03/2022 No Yes L98.412 Non-pressure chronic ulcer of buttock with fat layer exposed 08/03/2022 No Yes Inactive Problems Resolved Problems Electronic Signature(s) Signed: 11/06/2022 9:57:45 AM By: Kalman Shan DO Entered By: Kalman Shan on 11/06/2022 09:52:44 [name] ([id number]) [id number] Page 4 of 8 -------------------------------------------------------------------------------- Progress Note Details Patient Name: Date of Service: [name] 11/06/2022 8:45 A M Medical Record Number: [id number] Patient Account Number: [id number] Date of Birth/Sex: Treating RN: 02-13-1956 (67 y.o. F) Primary Care Provider: Cathlean Cower Other Clinician: Referring Provider: Treating Provider/Extender: Hollie Beach in Treatment: 13 Subjective Chief Complaint Information obtained from Patient 08/03/2022; several year history of waxing and waning to a wound to the intergluteal cleft History of Present Illness  (HPI) 11/02/2019 ADMISSION This is a 67 year old woman who has a wound on the right posterior Achilles area towards the distal end of the Achilles tendon surgery scars. She tells me that roughly 2-1/2 to 3 weeks ago she was picking at some dry skin in the area pulled it off and that is how the wound opened up. She had an original spontaneous Achilles tendon rupture in the early 1990s that was repaired and then very shortly thereafter it reruptured and she required another surgery. She has not had any problems with this since then. The wound itself currently has episodic sharp pain some swelling. She was put on Augmentin by her primary physician on 10/30/2019 for possible coexistent cellulitis. She used hydrogen peroxide for a while with topical antibiotics. Mostly she has been using topical antibiotics as somebody told her that hydrogen peroxide was not good to use. 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 Surgery was in [place], bilateral total knee replacements. ABIs in our clinic on the right were 1.09 1/21; patient here with a linear wound in the right Achilles area. Her wrap fell down she did not call us to replace it. She has questions about her compression wrap necessity. Finally she asked if the wound could be sutured The patient is under a lot of pressure with initiation of chemotherapy she is going to have a port placed etc. 1/28; linear wound in the right Achilles area in the setting of previous surgery. She is going for a port for chemotherapy for her breast cancer. She has apparently made herself an appointment with orthopedic surgery. I am not sure who she has made an appointment with at this point. 2/12  linear wound in the right Achilles is come in in terms of length. This is in the setting of previous surgery in this area The patient is undergoing chemotherapy for breast cancer  and having significant side effects [diarrhea] I am trying not to bring in her in too often as long as her wound is contracted 3/5; linear wound in the right Achilles. Not quite as long however still some measurable depth. She tells [place] that she has had problems with chemotherapy also apparently pseudomembranous colitis. She has not had much attention to paid to the wound. Came in without any dressing on etc. 4/16; right Achilles. She is completely closed 08/03/2022 [name] is a 67 year old female with a past medical history of left sided breast cancer currently on chemotherapy, morbid obesity and currently controlled type 2 diabetes that presents the clinic for a wound to her intergluteal cleft. She states that this is an area that [place] and wanes in healing. She does report sitting for most of the day. She has been using zinc oxide to the area. She currently denies signs of infection. 10/23; patient presents for follow-up. She has been using AandD ointment to the wound bed. She did not receive her supplies from [place]. She has no issues or complaints today. 11/17; patient missed her last clinic appointment. She states she has been using AandD ointment to the wound bed along with silver alginate. She has no issues or complaints today. 12/1; patient presents for follow-up. Patient states she has been very busy over the holidays and has been unable to use the AandD ointment or silver alginate to the wound area. 12/21; patient presents for follow-up. She has been using AandD ointment to the wound bed. She states it is tough to tend to the wound every day. 1/5; patient presents for follow-up. She has been using bacitracin ointment to the wound bed. She purchased Anasept spray and has been using this to clean the  wound bed. She has no issues or complaints today. 1/19; patient presents for follow-up. She has been using bacitracin ointment to the wound bed and Anasept spray to cleanse the wound between dressing changes. She has no issues or complaints today. There is been improvement in wound healing. Patient History Information obtained from Patient. Family History Diabetes - Paternal Grandparents,Mother,Father, Heart Disease - Father, Hypertension - Mother,Father, No family history of Cancer, Hereditary Spherocytosis, Kidney Disease, Lung Disease, Seizures, Stroke, Thyroid Problems, Tuberculosis. 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 Medical History Hematologic/Lymphatic Patient has history of Anemia, Lymphedema Respiratory Patient has history of Sleep Apnea - no CPAP Cardiovascular Patient has history of Hypertension Endocrine Patient has history of Type II Diabetes Genitourinary TAMARI, REDWINE ([id number]) [id number] Page 5 of 8 Denies history of End Stage Renal Disease Integumentary (Skin) Denies history of History of Burn Musculoskeletal Patient has history of Osteoarthritis Oncologic Patient has history of Received Radiation Denies history of Received Chemotherapy Psychiatric Denies history of Anorexia/bulimia, Confinement Anxiety Hospitalization/Surgery History - bilk knee replacements. - hysterectomy. - left breast lumpectomy. - left achilles repair. - right achilles repair. - c-section. - neck surgery-pinched spinal cord. Medical A Surgical History Notes nd Constitutional Symptoms (General Health) morbid obesity Genitourinary urinary incontinence Oncologic hx left breast CA with recurring metastatic disease, hx uterine CA Objective Constitutional respirations regular, non-labored and within target range for patient.. Vitals Time Taken: 9:02  AM, Temperature: 98.1 F, Pulse: 65 bpm, Respiratory Rate: 18 breaths/min, Blood Pressure: 129/84 mmHg. Psychiatric pleasant and cooperative. General Notes: T the intergluteal cleft there is an open wound limited to skin breakdown o Integumentary (Hair,  Skin) Wound #2 status is Open. Original cause of wound was Gradually Appeared. The date acquired was: 10/19/2005. The wound has been in treatment 13 weeks. The wound is located on the Sacrum. The wound measures 2cm length x 0.3cm width x 0.1cm depth; 0.471cm^2 area and 0.047cm^3 volume. There is Fat Layer (Subcutaneous Tissue) exposed. There is no tunneling or undermining noted. There is a medium amount of serosanguineous drainage noted. The wound margin is distinct with the outline attached to the wound base. There is large (67-100%) red, pink granulation within the wound bed. There is a small (1-33%) amount of necrotic tissue within the wound bed. The periwound skin appearance did not exhibit: Callus, Crepitus, Excoriation, Induration, Rash, Scarring, Dry/Scaly, Maceration, Atrophie Blanche, Cyanosis, Ecchymosis, Hemosiderin Staining, Mottled, Pallor, Rubor, Erythema. Periwound temperature was noted as No Abnormality. The periwound has tenderness on palpation. Assessment Active Problems ICD-10 Type 2 diabetes mellitus with other skin ulcer Non-pressure chronic ulcer of buttock with fat layer exposed Patient's wound appears well-healing. I recommended continuing bacitracin ointment and Anasept spray to cleanse the wound between dressing changes. Continue aggressive offloading to this area. Due to body habitus this is a difficult wound to heal. Plan Follow-up Appointments: Return appointment in 1 month. - w/ Dr. Heber Gu [place] Bathing/ Shower/ Hygiene: May shower and wash wound with soap and water. - use anacept spray cleanse wound after shower. Off-Loading: Turn and reposition every 2 hours WOUND #2: - Sacrum Wound Laterality: Cleanser: Anasept  Antimicrobial Skin and Wound Cleanser, 8 (oz) 1 x Per Day/30 Days Discharge Instructions: Cleanse the wound with Anasept cleanser prior to applying a clean dressing using gauze sponges, not tissue or cotton balls. Topical: Gentamicin 1 x Per Day/30 Days SUJEY, GUNDRY V ([id number]) [id number] Page 6 of 8 Discharge Instructions: As directed by physician; apply only in clinic. Topical: bacitracin 1 x Per Day/30 Days Discharge Instructions: use daily at home. Secondary Dressing: Woven Gauze Sponge, Non-Sterile 4x4 in (Generic) 1 x Per Day/30 Days Discharge Instructions: Apply over primary dressing as directed. 1. Bacitracin ointment 2. Anasept spray 3. Aggressive offloading 4. Follow-up in 1 month Electronic Signature(s) Signed: 11/06/2022 9:57:45 AM By: Kalman Shan DO Entered By: Kalman Shan on 11/06/2022 09:57:00 -------------------------------------------------------------------------------- HxROS Details Patient Name: Date of Service: [name], [name] 11/06/2022 8:45 A M Medical Record Number: [id number] Patient Account Number: [id number] Date of Birth/Sex: Treating RN: 1956/04/14 (67 y.o. F) Primary Care Provider: Cathlean Cower Other Clinician: Referring Provider: Treating Provider/Extender: Hollie Beach in Treatment: 13 Information Obtained From Patient Constitutional Symptoms (General Health) Medical History: Past Medical History Notes: morbid obesity Hematologic/Lymphatic Medical History: Positive for: Anemia; Lymphedema Respiratory Medical History: Positive for: Sleep Apnea - no CPAP Cardiovascular Medical History: Positive for: Hypertension Endocrine Medical History: Positive for: Type II Diabetes Time with diabetes: 5 yrs Treated with: Oral agents Blood sugar tested every day: No Genitourinary Medical History: Negative for: End Stage Renal Disease Past Medical History Notes: urinary  incontinence Integumentary (Skin) Medical History: Negative for: History of Burn DHARA, SCHEPP ([id number]) [id number] Page 7 of 8 Musculoskeletal Medical History: Positive for: Osteoarthritis Oncologic Medical History: Positive for: Received Radiation Negative for: Received Chemotherapy Past Medical History Notes: hx left breast CA with recurring metastatic disease, hx uterine CA Psychiatric Medical History: Negative for: Anorexia/bulimia; Confinement Anxiety Immunizations Pneumococcal Vaccine: Received Pneumococcal Vaccination: Yes Received Pneumococcal Vaccination On or After 60th Birthday: Yes Implantable Devices Yes Hospitalization / Surgery History Type of Hospitalization/Surgery bilk knee replacements hysterectomy left breast  lumpectomy left achilles repair right achilles repair c-section neck surgery-pinched spinal cord Family and Social History Cancer: No; Diabetes: Yes - Paternal Grandparents,Mother,Father; Heart Disease: Yes - Father; Hereditary Spherocytosis: No; Hypertension: Yes - Mother,Father; Kidney Disease: No; Lung Disease: No; Seizures: No; Stroke: No; Thyroid Problems: No; Tuberculosis: No; Never smoker; Marital Status - Divorced; Alcohol Use: Rarely; Drug Use: Prior History - TCH; Caffeine Use: Daily - tea; Financial Concerns: No; Food, Clothing or Shelter Needs: No; Support System Lacking: No; Transportation Concerns: Yes - will have multiple appointments for breast CA Electronic Signature(s) Signed: 11/06/2022 9:57:45 AM By: Kalman Shan DO Entered By: Kalman Shan on 11/06/2022 09:54:29 -------------------------------------------------------------------------------- SuperBill Details Patient Name: Date of Service: Jacqueline Lyons V. 11/06/2022 Medical Record Number: [id number] Patient Account Number: [id number] Date of Birth/Sex: Treating RN: 16-Sep-1956 (67 y.o. [name], [name]  Provider: Cathlean Cower Other Clinician: Referring Provider: Treating Provider/Extender: Hollie Beach in Treatment: 13 Diagnosis Coding ICD-10 Codes Code Description E11.622 Type 2 diabetes mellitus with other skin ulcer L98.412 Non-pressure chronic ulcer of buttock with fat layer exposed Facility Procedures : [facility] CPT4 CodeHERMA, UBALLE ([id number]) [id number] Description: [id number] - WOUND CARE VISIT-LEV 3 EST PT Modifier: [id number] Quantity: [id number] Page 8 of 8 1 Physician Procedures : CPT4 Code Description Modifier [id number] - WC PHYS LEVEL 3 - EST PT ICD-10 Diagnosis Description E11.622 Type 2 diabetes mellitus with other skin ulcer L98.412 Non-pressure chronic ulcer of buttock with fat layer exposed Quantity: 1 Electronic Signature(s) Signed: 11/06/2022 9:57:45 AM By: Kalman Shan DO Entered By: Kalman Shan on 11/06/2022 09:57:14

## 2022-11-09 ENCOUNTER — Other Ambulatory Visit: Payer: Self-pay

## 2022-11-09 LAB — SPECIMEN STATUS REPORT

## 2022-11-11 NOTE — Progress Notes (Signed)
[name], [name] ([id number]) [id number] Page 1 of 9 Visit Report for 11/06/2022 Arrival Information Details Patient Name: Date of Service: [place] [name] 11/06/2022 8:45 A M Medical Record Number: [id number] Patient Account Number: [id number] Date of Birth/Sex: Treating RN: [birth year] (67 y.o. F) Primary Care Andrina Locken: Cathlean Cower Other Clinician: Referring Tavio Biegel: Treating Tamyah Cutbirth/Extender: Hollie Beach in Treatment: 13 Visit Information History Since Last Visit Added or deleted any medications: No Patient Arrived: [name] Any new allergies or adverse reactions: No Arrival Time: 09:01 Had a fall or experienced change in No Accompanied By: self activities of daily living that may affect Transfer Assistance: None risk of falls: Patient Identification Verified: Yes Signs or symptoms of abuse/neglect since last visito No Secondary Verification Process Completed: Yes Hospitalized since last visit: No Patient Requires Transmission-Based Precautions: No Implantable device outside of the clinic excluding No Patient Has Alerts: No cellular tissue based products placed in the center since last visit: Pain Present Now: No Electronic Signature(s) Signed: 11/06/2022 11:49:43 AM By: Erenest Blank Entered By: Erenest Blank on 11/06/2022 09:01:56 -------------------------------------------------------------------------------- Clinic Level of Care Assessment Details Patient Name: Date of Service: [name] 11/06/2022 8:45 A M Medical Record Number: [id number] Patient Account Number: [id number] Date of Birth/Sex: Treating RN: 1955/11/16 (67 y.o. [name], [name] Dwan Hemmelgarn: Cathlean Cower Other Clinician: Referring Whittaker Lenis: Treating Alika Saladin/Extender: Hollie Beach in Treatment: 13 Clinic Level of Care Assessment Items TOOL 4 Quantity Score X- 1 0 Use when only an EandM is  performed on FOLLOW-UP visit ASSESSMENTS - Nursing Assessment / Reassessment X- 1 10 Reassessment of Co-morbidities (includes updates in patient status) X- 1 5 Reassessment of Adherence to Treatment Plan ASSESSMENTS - Wound and Skin A ssessment / Reassessment X - Simple Wound Assessment / Reassessment - one wound 1 5 '[]'$  - 0 Complex Wound Assessment / Reassessment - multiple wounds '[]'$  - 0 Dermatologic / Skin Assessment (not related to wound area) ASSESSMENTS - Focused Assessment '[]'$  - 0 Circumferential Edema Measurements - multi extremities '[]'$  - 0 Nutritional Assessment / Counseling / Intervention '[]'$  - 0 Lower Extremity Assessment (monofilament, tuning fork, pulses) [name], [name] ([id number]) [id number] Page 2 of 9 '[]'$  - 0 Peripheral Arterial Disease Assessment (using hand held doppler) ASSESSMENTS - Ostomy and/or Continence Assessment and Care '[]'$  - 0 Incontinence Assessment and Management '[]'$  - 0 Ostomy Care Assessment and Management (repouching, etc.) PROCESS - Coordination of Care X - Simple Patient / Family Education for ongoing care 1 15 '[]'$  - 0 Complex (extensive) Patient / Family Education for ongoing care X- 1 10 Staff obtains Programmer, systems, Records, T Results / Process Orders est '[]'$  - 0 Staff telephones HHA, Nursing Homes / Clarify orders / etc '[]'$  - 0 Routine Transfer to another Facility (non-emergent condition) '[]'$  - 0 Routine Hospital Admission (non-emergent condition) '[]'$  - 0 New Admissions / Biomedical engineer / Ordering NPWT Apligraf, etc. , '[]'$  - 0 Emergency Hospital Admission (emergent condition) X- 1 10 Simple Discharge Coordination '[]'$  - 0 Complex (extensive) Discharge Coordination PROCESS - Special Needs '[]'$  - 0 Pediatric / Minor Patient Management '[]'$  - 0 Isolation Patient Management '[]'$  - 0 Hearing / Language / Visual special needs '[]'$  - 0 Assessment of Community assistance (transportation, D/C planning, etc.) '[]'$  -  0 Additional assistance / Altered mentation '[]'$  - 0 Support Surface(s) Assessment (bed, cushion, seat, etc.) INTERVENTIONS - Wound Cleansing / Measurement X - Simple Wound Cleansing - one wound 1 5 '[]'$  -  0 Complex Wound Cleansing - multiple wounds X- 1 5 Wound Imaging (photographs - any number of wounds) '[]'$  - 0 Wound Tracing (instead of photographs) X- 1 5 Simple Wound Measurement - one wound '[]'$  - 0 Complex Wound Measurement - multiple wounds INTERVENTIONS - Wound Dressings X - Small Wound Dressing one or multiple wounds 1 10 '[]'$  - 0 Medium Wound Dressing one or multiple wounds '[]'$  - 0 Large Wound Dressing one or multiple wounds X- 1 5 Application of Medications - topical '[]'$  - 0 Application of Medications - injection INTERVENTIONS - Miscellaneous '[]'$  - 0 External ear exam '[]'$  - 0 Specimen Collection (cultures, biopsies, blood, body fluids, etc.) '[]'$  - 0 Specimen(s) / Culture(s) sent or taken to Lab for analysis '[]'$  - 0 Patient Transfer (multiple staff / Civil Service fast streamer / Similar devices) '[]'$  - 0 Simple Staple / Suture removal (25 or less) '[]'$  - 0 Complex Staple / Suture removal (26 or more) '[]'$  - 0 Hypo / Hyperglycemic Management (close monitor of Blood Glucose) '[]'$  - 0 Ankle / Brachial Index (ABI) - do not check if billed separately [name], [name] ([id number]) [id number] Page 3 of 9 X- 1 5 Vital Signs Has the patient been seen at the hospital within the last three years: Yes Total Score: 90 Level Of Care: New/Established - Level 3 Electronic Signature(s) Signed: 11/11/2022 4:09:44 PM By: Rhae Hammock RN Entered By: Rhae Hammock on 11/06/2022 09:24:23 -------------------------------------------------------------------------------- Encounter Discharge Information Details Patient Name: Date of Service: [name], Jacqueline Gong Young. 11/06/2022 8:45 A M Medical Record Number: [id number] Patient Account Number: [id number] Date of Birth/Sex:  Treating RN: 04-16-1956 (67 y.o. [name], [name] Brixton Franko: Cathlean Cower Other Clinician: Referring Lezly Rumpf: Treating Koi Yarbro/Extender: Hollie Beach in Treatment: 13 Encounter Discharge Information Items Discharge Condition: Stable Ambulatory Status: Ambulatory Discharge Destination: Home Transportation: Private Auto Accompanied By: self Schedule Follow-up Appointment: Yes Clinical Summary of Care: Patient Declined Electronic Signature(s) Signed: 11/11/2022 4:09:44 PM By: Rhae Hammock RN Entered By: Rhae Hammock on 11/06/2022 09:25:56 -------------------------------------------------------------------------------- Lower Extremity Assessment Details Patient Name: Date of Service: Jacqueline Lyons Young. 11/06/2022 8:45 A M Medical Record Number: [id number] Patient Account Number: [id number] Date of Birth/Sex: Treating RN: Aug 01, 1956 (67 y.o. F) Primary Care Merideth Bosque: Cathlean Cower Other Clinician: Referring Alaisa Moffitt: Treating Carvel Huskins/Extender: Hollie Beach in Treatment: 13 Electronic Signature(s) Signed: 11/06/2022 11:49:43 AM By: Erenest Blank Entered By: Erenest Blank on 11/06/2022 09:03:53 -------------------------------------------------------------------------------- Multi Wound Chart Details Patient Name: Date of Service: Jacqueline Lyons Young. 11/06/2022 8:45 A M Medical Record Number: [id number] Patient Account Number: [id number] Date of Birth/Sex: Treating RN: 1956/01/22 (67 y.o. Shykeria Sakamoto, Lunette Stands ([id number]) [id number] Page 4 of 9 Primary Care Cleo Villamizar: Cathlean Cower Other Clinician: Referring Autumm Hattery: Treating Shourya Macpherson/Extender: Hollie Beach in Treatment: 13 Vital Signs Height(in): Pulse(bpm): 68 Weight(lbs): Blood Pressure(mmHg): 129/84 Body Mass Index(BMI): Temperature(F): 98.1 Respiratory Rate(breaths/min): 18 [2:Photos:]  [N/A:N/A] Sacrum N/A N/A Wound Location: Gradually Appeared N/A N/A Wounding Event: MASD N/A N/A Primary Etiology: Anemia, Lymphedema, Sleep Apnea, N/A N/A Comorbid History: Hypertension, Type II Diabetes, Osteoarthritis, Received Radiation 10/19/2005 N/A N/A Date Acquired: 13 N/A N/A Weeks of Treatment: Open N/A N/A Wound Status: No N/A N/A Wound Recurrence: 2x0.3x0.1 N/A N/A Measurements L x W x D (cm) 0.471 N/A N/A A (cm) : rea 0.047 N/A N/A Volume (cm) : 83.30% N/A N/A % Reduction in Area: 91.70% N/A N/A % Reduction in Volume: Full Thickness With Exposed  Support N/A N/A Classification: Structures Medium N/A N/A Exudate Amount: Serosanguineous N/A N/A Exudate Type: red, brown N/A N/A Exudate Color: Distinct, outline attached N/A N/A Wound Margin: Large (67-100%) N/A N/A Granulation Amount: Red, Pink N/A N/A Granulation Quality: Small (1-33%) N/A N/A Necrotic Amount: Fat Layer (Subcutaneous Tissue): Yes N/A N/A Exposed Structures: Fascia: No Tendon: No Muscle: No Joint: No Bone: No Small (1-33%) N/A N/A Epithelialization: Excoriation: No N/A N/A Periwound Skin Texture: Induration: No Callus: No Crepitus: No Rash: No Scarring: No Maceration: No N/A N/A Periwound Skin Moisture: Dry/Scaly: No Atrophie Blanche: No N/A N/A Periwound Skin Color: Cyanosis: No Ecchymosis: No Erythema: No Hemosiderin Staining: No Mottled: No Pallor: No Rubor: No No Abnormality N/A N/A Temperature: Yes N/A N/A Tenderness on Palpation: Treatment Notes Wound #2 (Sacrum) Cleanser Anasept Antimicrobial Skin and Wound Cleanser, 8 (oz) Discharge Instruction: Cleanse the wound with Anasept cleanser prior to applying a clean dressing using gauze sponges, not tissue or cotton balls. [name], [name] ([id number]) [id number] Page 5 of 9 Peri-Wound Care Topical Gentamicin Discharge Instruction: As directed by physician; apply only in  clinic. bacitracin Discharge Instruction: use daily at home. Primary Dressing Secondary Dressing Woven Gauze Sponge, Non-Sterile 4x4 in Discharge Instruction: Apply over primary dressing as directed. Secured With Compression Wrap Compression Stockings Environmental education officer) Signed: 11/06/2022 9:57:45 AM By: Kalman Shan DO Entered By: Kalman Shan on 11/06/2022 09:52:48 -------------------------------------------------------------------------------- Multi-Disciplinary Care Plan Details Patient Name: Date of Service: [name], Jacqueline Gong Young. 11/06/2022 8:45 A M Medical Record Number: [id number] Patient Account Number: [id number] Date of Birth/Sex: Treating RN: 1955/12/07 (67 y.o. [name], [name] Mariangela Heldt: Cathlean Cower Other Clinician: Referring Tiffney Haughton: Treating Mikia Delaluz/Extender: Hollie Beach in Treatment: 13 Active Inactive Wound/Skin Impairment Nursing Diagnoses: Impaired tissue integrity Knowledge deficit related to ulceration/compromised skin integrity Goals: Patient will have a decrease in wound volume by X% from date: (specify in notes) Date Initiated: 08/03/2022 Target Resolution Date: 11/20/2022 Goal Status: Active Patient/caregiver will verbalize understanding of skin care regimen Date Initiated: 08/03/2022 Target Resolution Date: 11/20/2022 Goal Status: Active Ulcer/skin breakdown will have a volume reduction of 30% by week 4 Date Initiated: 08/03/2022 Date Inactivated: 10/23/2022 Target Resolution Date: 10/16/2022 Unmet Reason: continue wound care. Goal Status: Unmet See Patient wound measurement . Interventions: Assess patient/caregiver ability to obtain necessary supplies Assess patient/caregiver ability to perform ulcer/skin care regimen upon admission and as needed Assess ulceration(s) every visit Notes: Electronic Signature(s) Signed: 11/11/2022 4:09:44 PM By: Rhae Hammock RN [name] ([id number]) [id number] Page 6 of 9 Entered By: Rhae Hammock on 11/06/2022 09:12:39 -------------------------------------------------------------------------------- Pain Assessment Details Patient Name: Date of Service: [name] 11/06/2022 8:45 A M Medical Record Number: [id number] Patient Account Number: [id number] Date of Birth/Sex: Treating RN: 1956-06-13 (67 y.o. F) Primary Care Ova Gillentine: Cathlean Cower Other Clinician: Referring Hank Walling: Treating Marya Lowden/Extender: Hollie Beach in Treatment: 13 Active Problems Location of Pain Severity and Description of Pain Patient Has Paino No Site Locations Pain Management and Medication Current Pain Management: Electronic Signature(s) Signed: 11/06/2022 11:49:43 AM By: Erenest Blank Entered By: Erenest Blank on 11/06/2022 09:03:45 -------------------------------------------------------------------------------- Patient/Caregiver Education Details Patient Name: Date of Service: [name] 1/19/2024andnbsp8:45 A M Medical Record Number: [id number] Patient Account Number: [id number] Date of Birth/Gender: Treating RN: [birth year] (67 y.o. [name], [name] Physician: Cathlean Cower Other Clinician: Referring Physician: Treating Physician/Extender: Hollie Beach in Treatment: 13 Education Assessment Education Provided To:  Patient Education Topics Provided Wound/Skin ImpairmentVICTORIANA, [name] ([id number]) [phone].pdf Page 7 of 9 Methods: Explain/Verbal Responses: Reinforcements needed, State content correctly Electronic Signature(s) Signed: 11/11/2022 4:09:44 PM By: Rhae Hammock RN Entered By: Rhae Hammock on 11/06/2022 09:17:16 -------------------------------------------------------------------------------- Wound Assessment Details Patient Name: Date of Service: Jacqueline Lyons Young. 11/06/2022 8:45 A M Medical Record Number: [id number] Patient Account Number: [id number] Date of Birth/Sex: Treating RN: Mar 13, 1956 (67 y.o. F) Primary Care Kailany Dinunzio: Cathlean Cower Other Clinician: Referring Sol Englert: Treating Mechel Haggard/Extender: Hollie Beach in Treatment: 13 Wound Status Wound Number: 2 Primary MASD Etiology: Wound Location: Sacrum Wound Open Wounding Event: Gradually Appeared Status: Date Acquired: 10/19/2005 Comorbid Anemia, Lymphedema, Sleep Apnea, Hypertension, Type II Weeks Of Treatment: 13 History: Diabetes, Osteoarthritis, Received Radiation Clustered Wound: No Photos Wound Measurements Length: (cm) 2 Width: (cm) 0.3 Depth: (cm) 0.1 Area: (cm) 0.471 Volume: (cm) 0.047 % Reduction in Area: 83.3% % Reduction in Volume: 91.7% Epithelialization: Small (1-33%) Tunneling: No Undermining: No Wound Description Classification: Full Thickness With Exposed Suppor Wound Margin: Distinct, outline attached Exudate Amount: Medium Exudate Type: Serosanguineous Exudate Color: red, brown t Structures Foul Odor After Cleansing: No Slough/Fibrino Yes Wound Bed Granulation Amount: Large (67-100%) Exposed Structure Granulation Quality: Red, Pink Fascia Exposed: No Necrotic Amount: Small (1-33%) Fat Layer (Subcutaneous Tissue) Exposed: Yes Tendon Exposed: No Muscle Exposed: No Joint Exposed: No Bone Exposed: No Periwound Skin Texture Texture Color No Abnormalities Noted: No No Abnormalities Noted: No [name], [name] ([id number]) [id number] Page 8 of 9 Callus: No Atrophie Blanche: No Crepitus: No Cyanosis: No Excoriation: No Ecchymosis: No Induration: No Erythema: No Rash: No Hemosiderin Staining: No Scarring: No Mottled: No Pallor: No Moisture Rubor: No No Abnormalities Noted: No Dry / Scaly: No Temperature / Pain Maceration: No Temperature: No Abnormality Tenderness on Palpation:  Yes Treatment Notes Wound #2 (Sacrum) Cleanser Anasept Antimicrobial Skin and Wound Cleanser, 8 (oz) Discharge Instruction: Cleanse the wound with Anasept cleanser prior to applying a clean dressing using gauze sponges, not tissue or cotton balls. Peri-Wound Care Topical Gentamicin Discharge Instruction: As directed by physician; apply only in clinic. bacitracin Discharge Instruction: use daily at home. Primary Dressing Secondary Dressing Woven Gauze Sponge, Non-Sterile 4x4 in Discharge Instruction: Apply over primary dressing as directed. Secured With Compression Wrap Compression Stockings Add-Ons Electronic Signature(s) Signed: 11/06/2022 11:49:43 AM By: Erenest Blank Entered By: Erenest Blank on 11/06/2022 09:07:36 -------------------------------------------------------------------------------- Vitals Details Patient Name: Date of Service: [name], Jacqueline Gong Young. 11/06/2022 8:45 A M Medical Record Number: [id number] Patient Account Number: [id number] Date of Birth/Sex: Treating RN: 24-Feb-1956 (67 y.o. F) Primary Care Kary Colaizzi: Cathlean Cower Other Clinician: Referring Desirae Mancusi: Treating Shloka Baldridge/Extender: Hollie Beach in Treatment: 13 Vital Signs Time Taken: 09:02 Temperature (F): 98.1 Pulse (bpm): 65 Respiratory Rate (breaths/min): 18 Blood Pressure (mmHg): 129/84 Reference Range: 80 - 120 mg / dl Electronic Signature(s) Signed: 11/06/2022 11:49:43 AM By: Erenest Blank Entered By: Erenest Blank on 11/06/2022 09:03:39 [name] ([id number]) [id number] Page 9 of 9

## 2022-11-15 ENCOUNTER — Other Ambulatory Visit: Payer: Self-pay | Admitting: Cardiology

## 2022-11-16 ENCOUNTER — Telehealth: Payer: Self-pay | Admitting: Pharmacist

## 2022-11-16 MED ORDER — MOUNJARO 7.5 MG/0.5ML ~~LOC~~ SOAJ: 7.5000 mg | SUBCUTANEOUS | 0 refills | Status: DC

## 2022-11-16 NOTE — Telephone Encounter (Signed)
Pt c/o medication issue:  1. Name of Medication:   tirzepatide [facility]) 5 MG/0.5ML Pen    2. How are you currently taking this medication (dosage and times per day)?   Inject 5 mg into the skin once a week.   3. Are you having a reaction (difficulty breathing--STAT)? No  4. What is your medication issue? Pt would like a callback regarding Prior Auth for medication. She states that pharmacy is needing this. Please advise

## 2022-11-16 NOTE — Telephone Encounter (Signed)
She already has prior auth on file, I got this approved on 10/21/22 and request was approved through 10/19/23.   She probably needs new rx for next dose. Called pt to discuss, she has been tolerating her '5mg'$  dose well. Sent in rx for 7.'5mg'$  for next month.

## 2022-11-18 ENCOUNTER — Telehealth (HOSPITAL_COMMUNITY): Payer: Self-pay

## 2022-11-18 NOTE — Telephone Encounter (Signed)
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 She wonders if this is what is causing her carpal tunnel since she has had several test done and no one can figure out what caused it. She would like to come and discuss this with you in person. Please advise.

## 2022-11-19 NOTE — Telephone Encounter (Signed)
She is correct that it is a risk.  Her insurance refused PYP scan in the past.  Let's see if we can get a PYP scan approved for her.  She had the myeloma testing in the past.  If still turned down, we can get a cardiac MRI to look for amyloid. She should see me after testing is down.

## 2022-11-19 NOTE — Telephone Encounter (Signed)
Patient advised and verbalized understanding. Jasmine see below, can you initiate a prior auth for this patient?

## 2022-11-23 ENCOUNTER — Encounter: Payer: Self-pay | Admitting: Registered"

## 2022-11-23 ENCOUNTER — Encounter: Payer: Medicare Other | Attending: Adult Health | Admitting: Registered"

## 2022-11-23 VITALS — Ht 59.0 in | Wt 240.4 lb

## 2022-11-23 DIAGNOSIS — E119 Type 2 diabetes mellitus without complications: Secondary | ICD-10-CM | POA: Insufficient documentation

## 2022-11-23 LAB — HM MAMMOGRAPHY

## 2022-11-23 NOTE — Progress Notes (Signed)
Diabetes Self-Management Education  Visit Type: First/Initial  Appt. Start Time: 0920 Appt. End Time: 1030  11/23/2022  [name], identified by name and date of birth, is a 67 y.o. female with a diagnosis of Diabetes: Type 2.   Pt states she is most concerned about her neuropathy, also would like to lose weight.  ASSESSMENT  Height '4\' 11"'$  (1.499 m), weight 240 lb 6.4 oz (109 kg), last menstrual period 05/18/2016. Body mass index is 48.55 kg/m.  Component Ref Range & Units 4 mo ago (07/10/22) 1 yr ago (08/20/21) 1 yr ago (03/05/21) 2 yr ago (09/27/20) 2 yr ago (04/30/20) 3 yr ago (10/24/19) 3 yr ago (04/28/19)  Hgb A1c MFr Bld 4.6 - 6.5 % 7.1 High  6.6 High  R, CM 5.9 CM 6.2 High  R, CM 5.7 Abnormal  R 7.3 High  CM 7.3 High  CM   DM Medication: Mounjaro 7.5/0.5  SMBG: Pt not checking due to neuropathy  Pt reports she was started on Mounjaro about 8 weeks ago by a pharmacist. Pt states she was started on metformin when first diagnosed but requested a different medication because she heard about the metformin recall. Pt states she was taking Jardiance but it was discontinued 3 yrs due to cancer treatment.  Pt ambulates with a cane. Pt states at one point she was using a Rolator, but has had some improvement in her mobility.  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 Pt states she is working with cardiologist who will likely test her for ATTR. Pt states when she eats bread or starch her neuropathy seems to be worse right after eating.   Pt reports a new patient visit with endocrinologist is scheduled for 12/07/22. Pt states she would like copy of visit notes to be sent to Dr. Denton Young. Pt states her A1c came down when she lost a lot of weight due to appetite loss during cancer treatments. Pt states she her appetite came back she started binge eating again.   Pt states she doesn't keep salad stuff in the refrigerator  because it goes bad before she eats it. Pt reports she cooks just for herself and often only wants to eat the main entree, not motivated to prepare other sides. Pt reports she likes salads sometimes but wants to use too much salad dressing.   Pt reports she has been using Mounjaro ~8 weeks and has lost about 10 lbs. Pt states she was binging when she first started taking it, but her appetite has decreased.  Pt reports she mostly craves sugar in beverage form, doesn't have sweet foods in her house. Pt states sometimes she drinks Lipton sweet tea throughout the night.   Pt states she has noticed recently she is experiencing being out of breath more often just doing regular chores. Pt states she feels weight loss would help.  Neuropathy sxs may improve by improving blood sugar control and eating a balanced diet to help reduce inflammation. Before discussing weight loss, it is important to work toward eating a more balanced diet and limiting ultra processed foods. Next steps will be to discuss portion sizes and non-hunger eating.   Diabetes Self-Management Education - 11/23/22 0900       Visit Information   Visit Type First/Initial      Initial Visit   Diabetes Type Type 2    Date Diagnosed 2015    Are you currently following a meal  plan? No    Are you taking your medications as prescribed? Yes      Health Coping   How would you rate your overall health? Fair      Psychosocial Assessment   Patient Belief/Attitude about Diabetes Afraid    How often do you need to have someone help you when you read instructions, pamphlets, or other written materials from your doctor or pharmacy? 1 - Never    What is the last grade level you completed in school? 16      Complications   Last HgB A1C per patient/outside source 7.1 %    How often do you check your blood sugar? 0 times/day (not testing)    Have you had a dilated eye exam in the past 12 months? Yes    Have you had a dental exam in the past 12  months? Yes    Are you checking your feet? Yes    How many days per week are you checking your feet? 2      Dietary Intake   Breakfast lipton sweet tea    Lunch ham & cheese hamburger bun white    Dinner 3 chicken wings      Activity / Exercise   Activity / Exercise Type ADL's      Patient Education   Previous Diabetes Education Yes (please comment)   don't remember when   Healthy Eating Role of diet in the treatment of diabetes and the relationship between the three main macronutrients and blood glucose level;Plate Method    Being Active Role of exercise on diabetes management, blood pressure control and cardiac health.      Individualized Goals (developed by patient)   Nutrition General guidelines for healthy choices and portions discussed      Outcomes   Expected Outcomes Demonstrated interest in learning. Expect positive outcomes    Future DMSE 4-6 wks    Program Status Not Completed            Individualized Plan for Diabetes Self-Management Training:   Learning Objective:  Patient will have a greater understanding of diabetes self-management. Patient education plan is to attend individual and/or group sessions per assessed needs and concerns.   Patient Instructions  Consider weaning yourself off the lemonade & tea. Pay attention to what you are feeling when craving them.   Experiment with making infused water.  Aim for balanced meals following MyPlate You can [place] the Shopping list handout for ideas  When you cook your whiting fish this week think about having a balanced plate including vegetables - if canned rinse before.  If having starch aim to get whole grains.  For canned foods that you don't want to rinse, look for lower sodium.  For fresh produce some ideas to help reduce waste Kale Spinach - when going bad can cook it. Romaine hearts Cherry tomatoes last longer for pre-making your salad  [place] Extension website is a good resource for food tips  including freezing MadSurgeon.es  Expected Outcomes:  Demonstrated interest in learning. Expect positive outcomes  Education material provided: My Plate, Chemical engineer (MyPlate)  If problems or questions, patient to contact team via:  Phone and MyChart  Future DSME appointment: 4-6 wks

## 2022-11-23 NOTE — Patient Instructions (Addendum)
Consider weaning yourself off the lemonade & tea. Pay attention to what you are feeling when craving them.   Experiment with making infused water.  Aim for balanced meals following MyPlate You can [place] the Shopping list handout for ideas  When you cook your whiting fish this week think about having a balanced plate including vegetables - if canned rinse before.  If having starch aim to get whole grains.  For canned foods that you don't want to rinse, look for lower sodium.  For fresh produce some ideas to help reduce waste Kale Spinach - when going bad can cook it. Romaine hearts Cherry tomatoes last longer for pre-making your salad  [place] Extension website is a good resource for food tips including freezing [email or web address].es

## 2022-11-25 ENCOUNTER — Other Ambulatory Visit: Payer: Self-pay | Admitting: *Deleted

## 2022-11-25 DIAGNOSIS — C50112 Malignant neoplasm of central portion of left female breast: Secondary | ICD-10-CM

## 2022-11-25 DIAGNOSIS — C50812 Malignant neoplasm of overlapping sites of left female breast: Secondary | ICD-10-CM

## 2022-11-26 ENCOUNTER — Inpatient Hospital Stay: Payer: Medicare Other

## 2022-11-26 ENCOUNTER — Other Ambulatory Visit: Payer: Self-pay | Admitting: Hematology and Oncology

## 2022-11-26 ENCOUNTER — Inpatient Hospital Stay: Payer: Medicare Other | Attending: Hematology and Oncology

## 2022-11-26 VITALS — BP 120/68 | HR 67 | Temp 98.3°F | Resp 16 | Ht 59.0 in | Wt 239.0 lb

## 2022-11-26 DIAGNOSIS — Z171 Estrogen receptor negative status [ER-]: Secondary | ICD-10-CM

## 2022-11-26 DIAGNOSIS — C50812 Malignant neoplasm of overlapping sites of left female breast: Secondary | ICD-10-CM | POA: Diagnosis present

## 2022-11-26 DIAGNOSIS — Z5112 Encounter for antineoplastic immunotherapy: Secondary | ICD-10-CM | POA: Diagnosis present

## 2022-11-26 DIAGNOSIS — C773 Secondary and unspecified malignant neoplasm of axilla and upper limb lymph nodes: Secondary | ICD-10-CM | POA: Diagnosis present

## 2022-11-26 LAB — CMP (CANCER CENTER ONLY)
ALT: 15 U/L (ref 0–44)
AST: 17 U/L (ref 15–41)
Albumin: 3.7 g/dL (ref 3.5–5.0)
Alkaline Phosphatase: 47 U/L (ref 38–126)
Anion gap: 6 (ref 5–15)
BUN: 26 mg/dL — ABNORMAL HIGH (ref 8–23)
CO2: 29 mmol/L (ref 22–32)
Calcium: 9.3 mg/dL (ref 8.9–10.3)
Chloride: 104 mmol/L (ref 98–111)
Creatinine: 1.15 mg/dL — ABNORMAL HIGH (ref 0.44–1.00)
GFR, Estimated: 53 mL/min — ABNORMAL LOW (ref >60)
Glucose, Bld: 84 mg/dL (ref 70–99)
Potassium: 3.7 mmol/L (ref 3.5–5.1)
Sodium: 139 mmol/L (ref 135–145)
Total Bilirubin: 0.4 mg/dL (ref 0.3–1.2)
Total Protein: 7.5 g/dL (ref 6.5–8.1)

## 2022-11-26 LAB — CBC WITH DIFFERENTIAL (CANCER CENTER ONLY)
Abs Immature Granulocytes: 0.03 10*3/uL (ref 0.00–0.07)
Basophils Absolute: 0 10*3/uL (ref 0.0–0.1)
Basophils Relative: 0 %
Eosinophils Absolute: 0.1 10*3/uL (ref 0.0–0.5)
Eosinophils Relative: 1 %
HCT: 31.8 % — ABNORMAL LOW (ref 36.0–46.0)
Hemoglobin: 10.3 g/dL — ABNORMAL LOW (ref 12.0–15.0)
Immature Granulocytes: 1 %
Lymphocytes Relative: 25 %
Lymphs Abs: 1.4 10*3/uL (ref 0.7–4.0)
MCH: 29.1 pg (ref 26.0–34.0)
MCHC: 32.4 g/dL (ref 30.0–36.0)
MCV: 89.8 fL (ref 80.0–100.0)
Monocytes Absolute: 0.6 10*3/uL (ref 0.1–1.0)
Monocytes Relative: 10 %
Neutro Abs: 3.5 10*3/uL (ref 1.7–7.7)
Neutrophils Relative %: 63 %
Platelet Count: 213 10*3/uL (ref 150–400)
RBC: 3.54 MIL/uL — ABNORMAL LOW (ref 3.87–5.11)
RDW: 14 % (ref 11.5–15.5)
WBC Count: 5.5 10*3/uL (ref 4.0–10.5)
nRBC: 0 % (ref 0.0–0.2)

## 2022-11-26 MED ORDER — ACETAMINOPHEN 325 MG PO TABS
650.0000 mg | ORAL_TABLET | Freq: Once | ORAL | Status: AC
  Administered 2022-11-26: 650 mg via ORAL
  Filled 2022-11-26: qty 2

## 2022-11-26 MED ORDER — SODIUM CHLORIDE 0.9% FLUSH
10.0000 mL | INTRAVENOUS | Status: DC | PRN
  Administered 2022-11-26: 10 mL

## 2022-11-26 MED ORDER — SODIUM CHLORIDE 0.9 % IV SOLN: Freq: Once | INTRAVENOUS | Status: AC

## 2022-11-26 MED ORDER — HEPARIN SOD (PORK) LOCK FLUSH 100 UNIT/ML IV SOLN
500.0000 [IU] | Freq: Once | INTRAVENOUS | Status: AC | PRN
  Administered 2022-11-26: 500 [IU]

## 2022-11-26 MED ORDER — DIPHENHYDRAMINE HCL 25 MG PO CAPS
25.0000 mg | ORAL_CAPSULE | Freq: Once | ORAL | Status: AC
  Administered 2022-11-26: 25 mg via ORAL
  Filled 2022-11-26: qty 1

## 2022-11-26 MED ORDER — TRASTUZUMAB-ANNS CHEMO 150 MG IV SOLR
6.0000 mg/kg | Freq: Once | INTRAVENOUS | Status: AC
  Administered 2022-11-26: 600 mg via INTRAVENOUS
  Filled 2022-11-26: qty 28.57

## 2022-11-26 NOTE — Patient Instructions (Signed)
[facility]   Discharge Instructions: Thank you for choosing [facility] to provide your oncology and hematology care.   If you have a lab appointment with the [facility], please go directly to the [facility] and check in at the registration area.   Wear comfortable clothing and clothing appropriate for easy access to any Portacath or PICC line.   We strive to give you quality time with your provider. You may need to reschedule your appointment if you arrive late (15 or more minutes).  Arriving late affects you and other patients whose appointments are after yours.  Also, if you miss three or more appointments without notifying the office, you may be dismissed from the clinic at the provider's discretion.      For prescription refill requests, have your pharmacy contact our office and allow 72 hours for refills to be completed.    Today you received the following chemotherapy and/or immunotherapy agents: Trastuzumab (Herceptin)      To help prevent nausea and vomiting after your treatment, we encourage you to take your nausea medication as directed.  BELOW ARE SYMPTOMS THAT SHOULD BE REPORTED IMMEDIATELY: *FEVER GREATER THAN 100.4 F (38 C) OR HIGHER *CHILLS OR SWEATING *NAUSEA AND VOMITING THAT IS NOT CONTROLLED WITH YOUR NAUSEA MEDICATION *UNUSUAL SHORTNESS OF BREATH *UNUSUAL BRUISING OR BLEEDING *URINARY PROBLEMS (pain or burning when urinating, or frequent urination) *BOWEL PROBLEMS (unusual diarrhea, constipation, pain near the anus) TENDERNESS IN MOUTH AND THROAT WITH OR WITHOUT PRESENCE OF ULCERS (sore throat, sores in mouth, or a toothache) UNUSUAL RASH, SWELLING OR PAIN  UNUSUAL VAGINAL DISCHARGE OR ITCHING   Items with * indicate a potential emergency and should be followed up as soon as possible or go to the Emergency Department if any problems should occur.  Please show the CHEMOTHERAPY ALERT CARD or IMMUNOTHERAPY  ALERT CARD at check-in to the Emergency Department and triage nurse.  Should you have questions after your visit or need to cancel or reschedule your appointment, please contact [facility]  Dept: [phone]  and follow the prompts.  Office hours are 8:00 a.m. to 4:30 p.m. Monday - Friday. Please note that voicemails left after 4:00 p.m. may not be returned until the following business day.  We are closed weekends and major holidays. You have access to a nurse at all times for urgent questions. Please call the main number to the clinic Dept: [phone] and follow the prompts.   For any non-urgent questions, you may also contact your provider using MyChart. We now offer e-Visits for anyone 62 and older to request care online for non-urgent symptoms. For details visit [email or web address].   Also download the MyChart app! Go to the app store, search "MyChart", open the app, select [facility], and log in with your MyChart username and password.

## 2022-11-27 ENCOUNTER — Ambulatory Visit (HOSPITAL_COMMUNITY)
Admission: RE | Admit: 2022-11-27 | Discharge: 2022-11-27 | Disposition: A | Discharge status: Home / Self Care | Payer: Medicare Other | Source: Ambulatory Visit | Attending: Hematology and Oncology | Admitting: Hematology and Oncology

## 2022-11-27 ENCOUNTER — Other Ambulatory Visit (HOSPITAL_COMMUNITY): Payer: Self-pay

## 2022-11-27 ENCOUNTER — Telehealth: Payer: Self-pay

## 2022-11-27 ENCOUNTER — Telehealth (HOSPITAL_COMMUNITY): Payer: Self-pay | Admitting: *Deleted

## 2022-11-27 DIAGNOSIS — C50112 Malignant neoplasm of central portion of left female breast: Secondary | ICD-10-CM | POA: Insufficient documentation

## 2022-11-27 DIAGNOSIS — Z171 Estrogen receptor negative status [ER-]: Secondary | ICD-10-CM | POA: Diagnosis present

## 2022-11-27 DIAGNOSIS — E8589 Other amyloidosis: Secondary | ICD-10-CM

## 2022-11-27 LAB — GLUCOSE, CAPILLARY: Glucose-Capillary: 85 mg/dL (ref 70–99)

## 2022-11-27 MED ORDER — FLUDEOXYGLUCOSE F - 18 (FDG) INJECTION
11.9000 | Freq: Once | INTRAVENOUS | Status: AC
  Administered 2022-11-27: 11.9 via INTRAVENOUS

## 2022-11-27 NOTE — Telephone Encounter (Signed)
Exam incomplete as additional imaging evaluation is needed

## 2022-11-27 NOTE — Telephone Encounter (Signed)
Ordered placed

## 2022-11-29 ENCOUNTER — Other Ambulatory Visit: Payer: Self-pay

## 2022-12-02 ENCOUNTER — Telehealth: Payer: Self-pay | Admitting: *Deleted

## 2022-12-02 NOTE — Telephone Encounter (Addendum)
-----   Message from Benay Pike, MD sent at 11/30/2022  1:39 PM EST ----- PET scan shows stable disease. I will discuss the details when she comes to see me. Thanks  Contacted patient with above message from  Dr. Chryl Heck. Patient has appt on 2/29. [name] verbalized understanding of information.

## 2022-12-03 ENCOUNTER — Encounter (HOSPITAL_COMMUNITY): Payer: Self-pay

## 2022-12-04 ENCOUNTER — Ambulatory Visit (HOSPITAL_BASED_OUTPATIENT_CLINIC_OR_DEPARTMENT_OTHER): Payer: Medicare Other | Admitting: Internal Medicine

## 2022-12-09 ENCOUNTER — Telehealth: Payer: Self-pay | Admitting: Pharmacist

## 2022-12-09 MED ORDER — MOUNJARO 7.5 MG/0.5ML ~~LOC~~ SOAJ: 7.5000 mg | SUBCUTANEOUS | 11 refills | Status: DC

## 2022-12-09 NOTE — Telephone Encounter (Signed)
Called pt to follow up with [facility]. Reports abdominal pain with Mounjaro 7.50m weekly. Saw her endocrinologist on Monday, A1c 5.8%. Experiencing lower appetite but hasn't lost much weight. Doesn't exercise because of back pain. Trying to watch her carbs and eat more salads. Drinking regular lipton tea which she was encouraged to stop. Home weight without clothes 233 lbs, baseline weight in office clothed was 242 lbs. She prefers to continue on Mounjaro 7.578mweekly instead of increasing or decreasing the dose. Advised her to call me if she'd like to change dose in the future.

## 2022-12-10 NOTE — Telephone Encounter (Signed)
Patient called again saying she is willing to go up to 64m on the mounjaro.

## 2022-12-10 NOTE — Telephone Encounter (Signed)
Patient calling in asking to speak to you. Please advise

## 2022-12-11 MED ORDER — MOUNJARO 10 MG/0.5ML ~~LOC~~ SOAJ: 10.0000 mg | SUBCUTANEOUS | 0 refills | Status: DC

## 2022-12-11 NOTE — Telephone Encounter (Signed)
Called pt back. Now states she wants to increase her dose for better weight loss since she hasn't been noticing much. Next dose up is '10mg'$ , not '15mg'$ . She would like to try '10mg'$  dose. Rx sent in, will f/u in 3-4 weeks for tolerability update.

## 2022-12-11 NOTE — Addendum Note (Signed)
Addended by: Brier Reid E on: 12/11/2022 09:00 AM   Modules accepted: Orders

## 2022-12-16 ENCOUNTER — Other Ambulatory Visit: Payer: Self-pay | Admitting: Cardiology

## 2022-12-16 NOTE — Telephone Encounter (Signed)
Will route to pharmacy team as FYI Dose recently increased

## 2022-12-17 ENCOUNTER — Inpatient Hospital Stay: Payer: Medicare Other

## 2022-12-17 ENCOUNTER — Other Ambulatory Visit: Payer: Self-pay

## 2022-12-17 ENCOUNTER — Inpatient Hospital Stay (HOSPITAL_BASED_OUTPATIENT_CLINIC_OR_DEPARTMENT_OTHER): Payer: Medicare Other | Admitting: Hematology and Oncology

## 2022-12-17 ENCOUNTER — Encounter: Payer: Self-pay | Admitting: Hematology and Oncology

## 2022-12-17 VITALS — BP 103/70 | HR 66 | Temp 97.7°F | Resp 16

## 2022-12-17 DIAGNOSIS — C50112 Malignant neoplasm of central portion of left female breast: Secondary | ICD-10-CM | POA: Diagnosis not present

## 2022-12-17 DIAGNOSIS — Z171 Estrogen receptor negative status [ER-]: Secondary | ICD-10-CM

## 2022-12-17 DIAGNOSIS — C50812 Malignant neoplasm of overlapping sites of left female breast: Secondary | ICD-10-CM | POA: Diagnosis not present

## 2022-12-17 DIAGNOSIS — Z95828 Presence of other vascular implants and grafts: Secondary | ICD-10-CM

## 2022-12-17 DIAGNOSIS — Z5112 Encounter for antineoplastic immunotherapy: Secondary | ICD-10-CM | POA: Diagnosis not present

## 2022-12-17 LAB — CMP (CANCER CENTER ONLY)
ALT: 12 U/L (ref 0–44)
AST: 17 U/L (ref 15–41)
Albumin: 4 g/dL (ref 3.5–5.0)
Alkaline Phosphatase: 50 U/L (ref 38–126)
Anion gap: 10 (ref 5–15)
BUN: 42 mg/dL — ABNORMAL HIGH (ref 8–23)
CO2: 27 mmol/L (ref 22–32)
Calcium: 9.2 mg/dL (ref 8.9–10.3)
Chloride: 102 mmol/L (ref 98–111)
Creatinine: 1.33 mg/dL — ABNORMAL HIGH (ref 0.44–1.00)
GFR, Estimated: 45 mL/min — ABNORMAL LOW (ref >60)
Glucose, Bld: 99 mg/dL (ref 70–99)
Potassium: 3.4 mmol/L — ABNORMAL LOW (ref 3.5–5.1)
Sodium: 139 mmol/L (ref 135–145)
Total Bilirubin: 0.4 mg/dL (ref 0.3–1.2)
Total Protein: 7.7 g/dL (ref 6.5–8.1)

## 2022-12-17 LAB — CBC WITH DIFFERENTIAL (CANCER CENTER ONLY)
Abs Immature Granulocytes: 0.01 10*3/uL (ref 0.00–0.07)
Basophils Absolute: 0 10*3/uL (ref 0.0–0.1)
Basophils Relative: 0 %
Eosinophils Absolute: 0.1 10*3/uL (ref 0.0–0.5)
Eosinophils Relative: 2 %
HCT: 32.5 % — ABNORMAL LOW (ref 36.0–46.0)
Hemoglobin: 10.5 g/dL — ABNORMAL LOW (ref 12.0–15.0)
Immature Granulocytes: 0 %
Lymphocytes Relative: 22 %
Lymphs Abs: 1.3 10*3/uL (ref 0.7–4.0)
MCH: 28.8 pg (ref 26.0–34.0)
MCHC: 32.3 g/dL (ref 30.0–36.0)
MCV: 89 fL (ref 80.0–100.0)
Monocytes Absolute: 0.7 10*3/uL (ref 0.1–1.0)
Monocytes Relative: 11 %
Neutro Abs: 3.9 10*3/uL (ref 1.7–7.7)
Neutrophils Relative %: 65 %
Platelet Count: 213 10*3/uL (ref 150–400)
RBC: 3.65 MIL/uL — ABNORMAL LOW (ref 3.87–5.11)
RDW: 14 % (ref 11.5–15.5)
WBC Count: 6 10*3/uL (ref 4.0–10.5)
nRBC: 0 % (ref 0.0–0.2)

## 2022-12-17 MED ORDER — TRASTUZUMAB-ANNS CHEMO 150 MG IV SOLR
6.0000 mg/kg | Freq: Once | INTRAVENOUS | Status: AC
  Administered 2022-12-17: 600 mg via INTRAVENOUS
  Filled 2022-12-17: qty 28.57

## 2022-12-17 MED ORDER — SODIUM CHLORIDE 0.9% FLUSH
10.0000 mL | INTRAVENOUS | Status: DC | PRN
  Administered 2022-12-17: 10 mL

## 2022-12-17 MED ORDER — HEPARIN SOD (PORK) LOCK FLUSH 100 UNIT/ML IV SOLN
500.0000 [IU] | Freq: Once | INTRAVENOUS | Status: AC | PRN
  Administered 2022-12-17: 500 [IU]

## 2022-12-17 MED ORDER — SODIUM CHLORIDE 0.9 % IV SOLN: Freq: Once | INTRAVENOUS | Status: AC

## 2022-12-17 MED ORDER — SODIUM CHLORIDE 0.9% FLUSH
10.0000 mL | Freq: Once | INTRAVENOUS | Status: AC
  Administered 2022-12-17: 10 mL

## 2022-12-17 MED ORDER — ACETAMINOPHEN 325 MG PO TABS
650.0000 mg | ORAL_TABLET | Freq: Once | ORAL | Status: AC
  Administered 2022-12-17: 650 mg via ORAL
  Filled 2022-12-17: qty 2

## 2022-12-17 MED ORDER — DIPHENHYDRAMINE HCL 25 MG PO CAPS
25.0000 mg | ORAL_CAPSULE | Freq: Once | ORAL | Status: AC
  Administered 2022-12-17: 25 mg via ORAL
  Filled 2022-12-17: qty 1

## 2022-12-17 NOTE — Patient Instructions (Signed)
[facility]  Discharge Instructions: Thank you for choosing [facility] to provide your oncology and hematology care.   If you have a lab appointment with the [facility], please go directly to the [facility] and check in at the registration area.   Wear comfortable clothing and clothing appropriate for easy access to any Portacath or PICC line.   We strive to give you quality time with your provider. You may need to reschedule your appointment if you arrive late (15 or more minutes).  Arriving late affects you and other patients whose appointments are after yours.  Also, if you miss three or more appointments without notifying the office, you may be dismissed from the clinic at the provider's discretion.      For prescription refill requests, have your pharmacy contact our office and allow 72 hours for refills to be completed.    Today you received the following chemotherapy and/or immunotherapy agent: Trastuzumab (Kanjinti)   To help prevent nausea and vomiting after your treatment, we encourage you to take your nausea medication as directed.  BELOW ARE SYMPTOMS THAT SHOULD BE REPORTED IMMEDIATELY: *FEVER GREATER THAN 100.4 F (38 C) OR HIGHER *CHILLS OR SWEATING *NAUSEA AND VOMITING THAT IS NOT CONTROLLED WITH YOUR NAUSEA MEDICATION *UNUSUAL SHORTNESS OF BREATH *UNUSUAL BRUISING OR BLEEDING *URINARY PROBLEMS (pain or burning when urinating, or frequent urination) *BOWEL PROBLEMS (unusual diarrhea, constipation, pain near the anus) TENDERNESS IN MOUTH AND THROAT WITH OR WITHOUT PRESENCE OF ULCERS (sore throat, sores in mouth, or a toothache) UNUSUAL RASH, SWELLING OR PAIN  UNUSUAL VAGINAL DISCHARGE OR ITCHING   Items with * indicate a potential emergency and should be followed up as soon as possible or go to the Emergency Department if any problems should occur.  Please show the CHEMOTHERAPY ALERT CARD or IMMUNOTHERAPY ALERT CARD  at check-in to the Emergency Department and triage nurse.  Should you have questions after your visit or need to cancel or reschedule your appointment, please contact [facility]  Dept: [phone]  and follow the prompts.  Office hours are 8:00 a.m. to 4:30 p.m. Monday - Friday. Please note that voicemails left after 4:00 p.m. may not be returned until the following business day.  We are closed weekends and major holidays. You have access to a nurse at all times for urgent questions. Please call the main number to the clinic Dept: [phone] and follow the prompts.   For any non-urgent questions, you may also contact your provider using MyChart. We now offer e-Visits for anyone 75 and older to request care online for non-urgent symptoms. For details visit [email or web address].   Also download the MyChart app! Go to the app store, search "MyChart", open the app, select , and log in with your MyChart username and password. Trastuzumab Injection What is this medication? TRASTUZUMAB (tras TOO zoo mab) treats breast cancer and stomach cancer. It works by blocking a protein that causes cancer cells to grow and multiply. This helps to slow or stop the spread of cancer cells. This medicine may be used for other purposes; ask your health care provider or pharmacist if you have questions. COMMON BRAND NAME(S): Herceptin, Janae Bridgeman, Ontruzant, Trazimera What should I tell my care team before I take this medication? They need to know if you have any of these conditions: Heart failure Lung disease An unusual or allergic reaction to trastuzumab, other medications, foods, dyes, or  preservatives Pregnant or trying to get pregnant Breast-feeding How should I use this medication? This medication is injected into a vein. It is given by your care team in a hospital or clinic setting. Talk to your care team about the use of this medication in  children. It is not approved for use in children. Overdosage: If you think you have taken too much of this medicine contact a poison control center or emergency room at once. NOTE: This medicine is only for you. Do not share this medicine with others. What if I miss a dose? Keep appointments for follow-up doses. It is important not to miss your dose. Call your care team if you are unable to keep an appointment. What may interact with this medication? Certain types of chemotherapy, such as daunorubicin, doxorubicin, epirubicin, idarubicin This list may not describe all possible interactions. Give your health care provider a list of all the medicines, herbs, non-prescription drugs, or dietary supplements you use. Also tell them if you smoke, drink alcohol, or use illegal drugs. Some items may interact with your medicine. What should I watch for while using this medication? Your condition will be monitored carefully while you are receiving this medication. This medication may make you feel generally unwell. This is not uncommon, as chemotherapy affects healthy cells as well as cancer cells. Report any side effects. Continue your course of treatment even though you feel ill unless your care team tells you to stop. This medication may increase your risk of getting an infection. Call your care team for advice if you get a fever, chills, sore throat, or other symptoms of a cold or flu. Do not treat yourself. Try to avoid being around people who are sick. Avoid taking medications that contain aspirin, acetaminophen, ibuprofen, naproxen, or ketoprofen unless instructed by your care team. These medications can hide a fever. Talk to your care team if you may be pregnant. Serious birth defects can occur if you take this medication during pregnancy and for 7 months after the last dose. You will need a negative pregnancy test before starting this medication. Contraception is recommended while taking this medication  and for 7 months after the last dose. Your care team can help you find the option that works for you. Do not breastfeed while taking this medication and for 7 months after stopping treatment. What side effects may I notice from receiving this medication? Side effects that you should report to your care team as soon as possible: Allergic reactions or angioedema--skin rash, itching or hives, swelling of the face, eyes, lips, tongue, arms, or legs, trouble swallowing or breathing Dry cough, shortness of breath or trouble breathing Heart failure--shortness of breath, swelling of the ankles, feet, or hands, sudden weight gain, unusual weakness or fatigue Infection--fever, chills, cough, or sore throat Infusion reactions--chest pain, shortness of breath or trouble breathing, feeling faint or lightheaded Side effects that usually do not require medical attention (report to your care team if they continue or are bothersome): Diarrhea Dizziness Headache Nausea Trouble sleeping Vomiting This list may not describe all possible side effects. Call your doctor for medical advice about side effects. You may report side effects to FDA at 1-800-FDA-1088. Where should I keep my medication? This medication is given in a hospital or clinic. It will not be stored at home. NOTE: This sheet is a summary. It may not cover all possible information. If you have questions about this medicine, talk to your doctor, pharmacist, or health care provider.  2023  Elsevier/Gold Standard (2022-02-05 00:00:00)

## 2022-12-17 NOTE — Progress Notes (Signed)
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 Relation Age of Onset   Diabetes Mother    Hypertension Mother    Ovarian cancer Mother    Heart disease Father        COPD   Alcohol abuse Father        ETOH dependence   Breast cancer Maternal Aunt        dx in her 39s   Lung cancer Maternal Uncle    Breast cancer Paternal Aunt    Cancer Maternal Grandmother        salivary gland cancer   Colon cancer Neg Hx    Colon polyps Neg Hx    Esophageal cancer Neg Hx    Stomach cancer Neg Hx    Rectal cancer  Neg Hx     Review of Systems  Constitutional:  Positive for fatigue. Negative for appetite change, chills, fever and unexpected weight change.  HENT:   Negative for hearing loss, lump/mass and trouble swallowing.   Eyes:  Negative for eye problems and icterus.  Respiratory:  Negative for chest tightness, cough and shortness of breath.   Cardiovascular:  Negative for chest pain, leg swelling and palpitations.  Gastrointestinal:  Negative for abdominal distention, abdominal pain,  constipation, diarrhea, nausea and vomiting.  Endocrine: Negative for hot flashes.  Genitourinary:  Negative for difficulty urinating.   Musculoskeletal:  Positive for back pain (chronic, seeing neurosurgery/pain management). Negative for arthralgias.  Skin:  Negative for itching and rash.  Neurological:  Positive for numbness (chronic, improved after stopping Kadcyla, however also has diabetes). Negative for dizziness, extremity weakness and headaches.  Hematological:  Negative for adenopathy. Does not bruise/bleed easily.  Psychiatric/Behavioral:  Negative for depression. The patient is not nervous/anxious.       PHYSICAL EXAMINATION  ECOG PERFORMANCE STATUS: 1 - Symptomatic but completely ambulatory  Vitals:   12/17/22 0821  BP: 115/83  Pulse: 76  Resp: 20  Temp: 98.4 F (36.9 C)  SpO2: 100%     Physical Exam Constitutional:      General: She is not in acute distress.    Appearance: Normal appearance. She is not toxic-appearing.  HENT:     Head: Normocephalic and atraumatic.  Eyes:     General: No scleral icterus. Cardiovascular:     Rate and Rhythm: Normal rate and regular rhythm.     Pulses: Normal pulses.     Heart sounds: Normal heart sounds.  Pulmonary:     Effort: Pulmonary effort is normal.     Breath sounds: Normal breath sounds.  Abdominal:     General: Abdomen is flat. Bowel sounds are normal. There is no distension.     Palpations: Abdomen is soft.     Tenderness: There is no abdominal tenderness.  Musculoskeletal:        General: No swelling.     Cervical back: Neck supple.  Lymphadenopathy:     Cervical: No cervical adenopathy.  Skin:    General: Skin is warm and dry.     Findings: No rash.  Neurological:     General: No focal deficit present.     Mental Status: She is alert.  Psychiatric:        Mood and Affect: Mood normal.        Behavior: Behavior normal.     LABORATORY DATA:  CBC    Component Value Date/Time   WBC 6.0 12/17/2022  0759   WBC 6.4 09/27/2020 1114   RBC 3.65 (L) 12/17/2022 0759   HGB 10.5 (L) 12/17/2022 0759   HGB 11.9 03/30/2017 1044   HCT 32.5 (L) 12/17/2022 0759   HCT 37.4 03/30/2017 1044   PLT 213 12/17/2022 0759   PLT 238 03/30/2017 1044   MCV 89.0 12/17/2022 0759   MCV 85.0 03/30/2017 1044   MCH 28.8 12/17/2022 0759   MCHC 32.3 12/17/2022 0759   RDW 14.0 12/17/2022 0759   RDW 15.1 (H) 03/30/2017 1044   LYMPHSABS 1.3 12/17/2022 0759   LYMPHSABS 1.3 03/30/2017 1044   MONOABS 0.7 12/17/2022 0759   MONOABS 0.5 03/30/2017 1044   EOSABS 0.1 12/17/2022 0759   EOSABS 0.0 03/30/2017 1044   BASOSABS 0.0 12/17/2022 0759   BASOSABS 0.0 03/30/2017 1044    CMP     Component Value  Date/Time   NA 139 11/26/2022 0814   NA 143 03/30/2017 1044   K 3.7 11/26/2022 0814   K 3.8 03/30/2017 1044   CL 104 11/26/2022 0814   CL 106 01/18/2013 0909   CO2 29 11/26/2022 0814   CO2 27 03/30/2017 1044   GLUCOSE 84 11/26/2022 0814   GLUCOSE 91 03/30/2017 1044   GLUCOSE 99 01/18/2013 0909   BUN 26 (H) 11/26/2022 0814   BUN 9.7 03/30/2017 1044   CREATININE 1.15 (H) 11/26/2022 0814   CREATININE 0.8 03/30/2017 1044   CALCIUM 9.3 11/26/2022 0814   CALCIUM 9.7 03/30/2017 1044   PROT 7.5 11/26/2022 0814   PROT 8.0 03/30/2017 1044   ALBUMIN 3.7 11/26/2022 0814   ALBUMIN 3.8 01/27/2021 1232   ALBUMIN 3.4 (L) 03/30/2017 1044   AST 17 11/26/2022 0814   AST 17 03/30/2017 1044   ALT 15 11/26/2022 0814   ALT 15 03/30/2017 1044   ALKPHOS 47 11/26/2022 0814   ALKPHOS 60 03/30/2017 1044   BILITOT 0.4 11/26/2022 0814   BILITOT 0.34 03/30/2017 1044   GFRNONAA 53 (L) 11/26/2022 0814   GFRAA >60 07/11/2020 0923         ASSESSMENT and THERAPY PLAN:   Malignant neoplasm of central portion of left breast in female, estrogen receptor negative (HCC)  Trishna is a 67 year old woman with history of metastatic breast cancer currently on treatment with Herceptin only.   Indiah is doing well today.  She has no  clinical signs of progression of metastatic breast cancer.  We reviewed her neuropathy and these appear to be related to her comorbidities.     Zaylyn will continue on Herceptin every 3 weeks and we will see her back in 6 weeks for labs follow-up and her next appointment. Recent restaging with stable disease.   For the muscle aches and cramps, I encouraged her to discuss with her PCP about may be reducing the dose of pravastatin if this is contributing some.  I have also sent an in basket message to Dr. Aundra Dubin since she was concerned about her MRI heart not being scheduled yet.  From breast cancer standpoint, she has been doing really well, no change.  RTC in 6 weeks with [place]. She will continue follow up with cardiology for ECHO and cardiac monitoring.   All questions were answered. The patient knows to call the clinic with any problems, questions or concerns. We can certainly see the patient much sooner if necessary.  Total encounter time:30 minutes*in face-to-face visit time, chart review, lab review, care coordination, order entry, and documentation of the encounter time.  *Total Encounter Time as defined by the Centers for Medicare and Medicaid Services includes, in addition to the face-to-face time of a patient visit (documented in the note above) non-face-to-face time: obtaining and reviewing outside history, ordering and reviewing medications, tests or procedures, care coordination (communications with other health care professionals or caregivers) and documentation in the medical record.

## 2022-12-18 ENCOUNTER — Encounter (HOSPITAL_BASED_OUTPATIENT_CLINIC_OR_DEPARTMENT_OTHER): Payer: Medicare Other | Attending: Internal Medicine | Admitting: Internal Medicine

## 2022-12-18 ENCOUNTER — Telehealth: Payer: Self-pay | Admitting: Pharmacist

## 2022-12-18 DIAGNOSIS — E11622 Type 2 diabetes mellitus with other skin ulcer: Secondary | ICD-10-CM | POA: Insufficient documentation

## 2022-12-18 DIAGNOSIS — Z96653 Presence of artificial knee joint, bilateral: Secondary | ICD-10-CM | POA: Insufficient documentation

## 2022-12-18 DIAGNOSIS — Z853 Personal history of malignant neoplasm of breast: Secondary | ICD-10-CM | POA: Insufficient documentation

## 2022-12-18 DIAGNOSIS — L98412 Non-pressure chronic ulcer of buttock with fat layer exposed: Secondary | ICD-10-CM | POA: Insufficient documentation

## 2022-12-18 DIAGNOSIS — Z8542 Personal history of malignant neoplasm of other parts of uterus: Secondary | ICD-10-CM | POA: Diagnosis not present

## 2022-12-18 MED ORDER — MOUNJARO 10 MG/0.5ML ~~LOC~~ SOAJ: 10.0000 mg | SUBCUTANEOUS | 0 refills | Status: DC

## 2022-12-18 MED ORDER — MOUNJARO 7.5 MG/0.5ML ~~LOC~~ SOAJ: 7.5000 mg | SUBCUTANEOUS | 0 refills | Status: DC

## 2022-12-18 NOTE — Telephone Encounter (Signed)
Pt c/o medication issue:  1. Name of Medication:   tirzepatide (MOUNJARO) 10 MG/0.5ML Pen    2. How are you currently taking this medication (dosage and times per day)?   3. Are you having a reaction (difficulty breathing--STAT)?   4. What is your medication issue? Pt called back stating the pharmacy does not have '10mg'$  in stock and they are unsure when they will get it. Pt wants to know what she should do now.

## 2022-12-18 NOTE — Addendum Note (Signed)
Addended by: Akera Snowberger E on: 12/18/2022 11:47 AM   Modules accepted: Orders

## 2022-12-18 NOTE — Telephone Encounter (Signed)
Called pt, she states [facility] only has '5mg'$  dose in stock. She'd like to try [facility]. Rx sent to pharmacy.

## 2022-12-18 NOTE — Telephone Encounter (Signed)
Pt called back, [facility] only has 7.'5mg'$  dose in stock. New rx sent in.

## 2022-12-21 NOTE — Progress Notes (Signed)
MERIAL, AFTAB ([id number]) [id number] Page 1 of 7 Visit Report for 12/18/2022 Chief Complaint Document Details Patient Name: Date of Service: [name] 12/18/2022 8:45 A M Medical Record Number: [id number] Patient Account Number: [id number] Date of Birth/Sex: Treating RN: 10/31/1955 (67 y.o. F) Primary Care Provider: Cathlean Cower Other Clinician: Referring Provider: Treating Provider/Extender: Hollie Beach in Treatment: 19 Information Obtained from: Patient Chief Complaint 08/03/2022; several year history of waxing and waning to a wound to the intergluteal cleft Electronic Signature(s) Signed: 12/18/2022 12:11:35 PM By: Kalman Shan DO Entered By: Kalman Shan on 12/18/2022 09:48:32 -------------------------------------------------------------------------------- HPI Details Patient Name: Date of Service: Jacqueline Lyons V. 12/18/2022 8:45 A M Medical Record Number: [id number] Patient Account Number: [id number] Date of Birth/Sex: Treating RN: Mar 19, 1956 (67 y.o. F) Primary Care Provider: Cathlean Cower Other Clinician: Referring Provider: Treating Provider/Extender: Hollie Beach in Treatment: 19 History of Present Illness HPI Description: 11/02/2019 ADMISSION This is a 67 year old woman who has a wound on the right posterior Achilles area towards the distal end of the Achilles tendon surgery scars. She tells me that roughly 2-1/2 to 3 weeks ago she was picking at some dry skin in the area pulled it off and that is how the wound opened up. She had an original spontaneous Achilles tendon rupture in the early 1990s that was repaired and then very shortly thereafter it reruptured and she required another surgery. She has not had any problems with this since then. The wound itself currently has episodic sharp pain some swelling. She was put on Augmentin by her primary physician on 10/30/2019  for possible coexistent cellulitis. She used hydrogen peroxide for a while with topical antibiotics. Mostly she has been using topical antibiotics as somebody told her that hydrogen peroxide was not good to use. 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 Surgery was in [place], bilateral total knee replacements. ABIs in our clinic on the right were 1.09 1/21; patient here with a linear wound in the right Achilles area. Her wrap fell down she did not call us to replace it. She has questions about her compression wrap necessity. Finally she asked if the wound could be sutured The patient is under a lot of pressure with initiation of chemotherapy she is going to have a port placed etc. 1/28; linear wound in the right Achilles area in the setting of previous surgery. She is going for a port for chemotherapy for her breast cancer. She has apparently made herself an appointment with orthopedic surgery. I am not sure who she has made an appointment with at this point. 2/12 linear wound in the right Achilles is come in in terms of length. This is in the setting of previous surgery in this area The patient is undergoing chemotherapy for breast cancer and having significant side effects [diarrhea] I am trying not to bring in her in too often as long as her wound is contracted 3/5; linear wound in the right Achilles. Not quite as long however still some measurable depth. She tells [place] that she has had problems with chemotherapy also apparently pseudomembranous colitis. She has not had much attention to paid to the wound. Came in without any  dressing on etc. 4/16; right Achilles. She is completely closed 08/03/2022 [name] is a 67 year old female with a past medical history of left sided breast cancer currently on chemotherapy, morbid obesity and currently controlled type 2 diabetes that presents the clinic for a wound to her intergluteal cleft. She states that this is an area that [place] and wanes in healing. She does report sitting for most of the day. She has been using zinc oxide to the area. She currently denies signs of infection. 10/23; patient presents for follow-up. She has been using AandD ointment to the wound bed. She did not receive her supplies from [place]. She has no issues or complaints today. 11/17; patient missed her last clinic appointment. She states she has been using AandD ointment to the wound bed along with silver alginate. She has no issues or complaints today. 12/1; patient presents for follow-up. Patient states she has been very busy over the holidays and has been unable to use the AandD ointment or silver alginate to the wound area. PATTON, WERBER ([id number]) [id number] Page 2 of 7 12/21; patient presents for follow-up. She has been using AandD ointment to the wound bed. She states it is tough to tend to the wound every day. 1/5; patient presents for follow-up. She has been using bacitracin ointment to the wound bed. She purchased Anasept spray and has been using this to clean the wound bed. She has no issues or complaints today. 1/19; patient presents for follow-up. She has been using bacitracin ointment to the wound bed and Anasept spray to cleanse the wound between dressing changes. She has no issues or complaints today. There is been improvement in wound healing. 3/1; patient presents for follow-up. She admits to not taking care of the wound consistently. She has been using Anasept spray to cleanse the wound and bacitracin to the open area. There has been no improvement in wound healing. Electronic Signature(s) Signed: 12/18/2022 12:11:35 PM  By: Kalman Shan DO Entered By: Kalman Shan on 12/18/2022 09:50:21 -------------------------------------------------------------------------------- Physical Exam Details Patient Name: Date of Service: Jacqueline Lyons V. 12/18/2022 8:45 A M Medical Record Number: [id number] Patient Account Number: [id number] Date of Birth/Sex: Treating RN: 30-Dec-1955 (67 y.o. F) Primary Care Provider: Cathlean Cower Other Clinician: Referring Provider: Treating Provider/Extender: Hollie Beach in Treatment: 19 Constitutional respirations regular, non-labored and within target range for patient.. Cardiovascular 2+ dorsalis pedis/posterior tibialis pulses. Psychiatric pleasant and cooperative. Notes T the intergluteal cleft there is an open wound limited to skin breakdown o Electronic Signature(s) Signed: 12/18/2022 12:11:35 PM By: Kalman Shan DO Entered By: Kalman Shan on 12/18/2022 09:51:01 -------------------------------------------------------------------------------- Physician Orders Details Patient Name: Date of Service: Jacqueline Lyons V. 12/18/2022 8:45 A M Medical Record Number: [id number] Patient Account Number: [id number] Date of Birth/Sex: Treating RN: October 09, 1956 (67 y.o. Debby Bud Primary Care Provider: Cathlean Cower Other Clinician: Referring Provider: Treating Provider/Extender: Hollie Beach in Treatment: 19 Verbal / Phone Orders: No Diagnosis Coding ICD-10 Coding Code Description E11.622 Type 2 diabetes mellitus with other skin ulcer L98.412 Non-pressure chronic ulcer of buttock with fat layer exposed Follow-up Appointments ppointment in 2 weeks. - Dr. Heber [place] 01/01/2023 Friday 0845 Return A Other: - Pick up ointment at your pharmacy today. Good Rx coupon given. Bathing/ Shower/ Hygiene May shower and wash wound with soap and water. - use anacept spray cleanse wound after shower. Off-Loading Turn and  reposition every 2 hours ASAKO, ALLEGRO V ([id number])  [phone].pdf Page 3 of 7 Wound Treatment Wound #2 - Sacrum Cleanser: Anasept Antimicrobial Skin and Wound Cleanser, 8 (oz) 1 x Per Day/30 Days Discharge Instructions: Cleanse the wound with Anasept cleanser prior to applying a clean dressing using gauze sponges, not tissue or cotton balls. Topical: clotrimazole/ betamethasome 1 x Per Day/30 Days Discharge Instructions: apply to wound daily. Secondary Dressing: Woven Gauze Sponge, Non-Sterile 4x4 in (Generic) 1 x Per Day/30 Days Discharge Instructions: Apply over primary dressing as directed. Patient Medications llergies: morphine, codeine, Cymbalta, Darvon, hydrocodone, oxycodone, rosuvastatin A Notifications Medication Indication Start End 12/18/2022 clotrimazole-betamethasone DOSE 1 - topical 1 %-0.05 % cream - 1 cream topical once daily to the affected area Electronic Signature(s) Signed: 12/18/2022 10:04:17 AM By: Kalman Shan DO Entered By: Kalman Shan on 12/18/2022 10:04:16 -------------------------------------------------------------------------------- Problem List Details Patient Name: Date of Service: Jacqueline Lyons V. 12/18/2022 8:45 A M Medical Record Number: [id number] Patient Account Number: [id number] Date of Birth/Sex: Treating RN: Apr 26, 1956 (67 y.o. F) Primary Care Provider: Cathlean Cower Other Clinician: Referring Provider: Treating Provider/Extender: Hollie Beach in Treatment: 19 Active Problems ICD-10 Encounter Code Description Active Date MDM Diagnosis E11.622 Type 2 diabetes mellitus with other skin ulcer 08/03/2022 No Yes L98.412 Non-pressure chronic ulcer of buttock with fat layer exposed 08/03/2022 No Yes Inactive Problems Resolved Problems Electronic Signature(s) Signed: 12/18/2022 12:11:35 PM By: Kalman Shan DO Entered By: Kalman Shan on 12/18/2022  09:44:45 -------------------------------------------------------------------------------- Progress Note Details Patient Name: Date of Service: Jacqueline Lyons V. 12/18/2022 8:45 A M Medical Record Number: [id number] Patient Account Number: [id number] Date of Birth/Sex: Treating RN: 08/25/1956 (67 y.o. F) Primary Care Provider: Cathlean Cower Other Clinician: Referring Provider: Treating Provider/Extender: Hollie Beach in Treatment: [address] [place] ([id number]) [id number] Page 4 of 7 Chief Complaint Information obtained from Patient 08/03/2022; several year history of waxing and waning to a wound to the intergluteal cleft History of Present Illness (HPI) 11/02/2019 ADMISSION This is a 67 year old woman who has a wound on the right posterior Achilles area towards the distal end of the Achilles tendon surgery scars. She tells me that roughly 2-1/2 to 3 weeks ago she was picking at some dry skin in the area pulled it off and that is how the wound opened up. She had an original spontaneous Achilles tendon rupture in the early 1990s that was repaired and then very shortly thereafter it reruptured and she required another surgery. She has not had any problems with this since then. The wound itself currently has episodic sharp pain some swelling. She was put on Augmentin by her primary physician on 10/30/2019 for possible coexistent cellulitis. She used hydrogen peroxide for a while with topical antibiotics. Mostly she has been using topical antibiotics as somebody told her that hydrogen peroxide was not good to use. Past medical history; type 2 diabetes recent hemoglobin A1c of 7.3, she has recurrent breast cancer in the left breast apparently with metastasis this is been found out recently she has a history of endometrial CA status post hysterectomy, history of ruptured Achilles tendons twice on the right than once on the  left. Surgery was in [place], bilateral total knee replacements. ABIs in our clinic on the right were 1.09 1/21; patient here with a linear wound in the right Achilles area. Her wrap fell down she did not call us to replace it. She has questions about her compression wrap necessity. Finally she asked if the wound could be sutured  The patient is under a lot of pressure with initiation of chemotherapy she is going to have a port placed etc. 1/28; linear wound in the right Achilles area in the setting of previous surgery. She is going for a port for chemotherapy for her breast cancer. She has apparently made herself an appointment with orthopedic surgery. I am not sure who she has made an appointment with at this point. 2/12 linear wound in the right Achilles is come in in terms of length. This is in the setting of previous surgery in this area The patient is undergoing chemotherapy for breast cancer and having significant side effects [diarrhea] I am trying not to bring in her in too often as long as her wound is contracted 3/5; linear wound in the right Achilles. Not quite as long however still some measurable depth. She tells [place] that she has had problems with chemotherapy also apparently pseudomembranous colitis. She has not had much attention to paid to the wound. Came in without any dressing on etc. 4/16; right Achilles. She is completely closed 08/03/2022 Ms. Tianah Vanhouten is a 67 year old female with a past medical history of left sided breast cancer currently on chemotherapy, morbid obesity and currently controlled type 2 diabetes that presents the clinic for a wound to her intergluteal cleft. She states that this is an area that [place] and wanes in healing. She does report sitting for most of the day. She has been using zinc oxide to the area. She currently denies signs of infection. 10/23; patient presents for follow-up. She has been using AandD ointment to the wound bed. She did  not receive her supplies from [place]. She has no issues or complaints today. 11/17; patient missed her last clinic appointment. She states she has been using AandD ointment to the wound bed along with silver alginate. She has no issues or complaints today. 12/1; patient presents for follow-up. Patient states she has been very busy over the holidays and has been unable to use the AandD ointment or silver alginate to the wound area. 12/21; patient presents for follow-up. She has been using AandD ointment to the wound bed. She states it is tough to tend to the wound every day. 1/5; patient presents for follow-up. She has been using bacitracin ointment to the wound bed. She purchased Anasept spray and has been using this to clean the wound bed. She has no issues or complaints today. 1/19; patient presents for follow-up. She has been using bacitracin ointment to the wound bed and Anasept spray to cleanse the wound between dressing changes. She has no issues or complaints today. There is been improvement in wound healing. 3/1; patient presents for follow-up. She admits to not taking care of the wound consistently. She has been using Anasept spray to cleanse the wound and bacitracin to the open area. There has been no improvement in wound healing. Patient History Information obtained from Patient. Family History Diabetes - Paternal Grandparents,Mother,Father, Heart Disease - Father, Hypertension - Mother,Father, No family history of Cancer, Hereditary Spherocytosis, Kidney Disease, Lung Disease, Seizures, Stroke, Thyroid Problems, Tuberculosis. 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 Medical History Hematologic/Lymphatic Patient has history of Anemia, Lymphedema Respiratory Patient has history of Sleep Apnea - no CPAP Cardiovascular Patient has history of Hypertension Endocrine Patient has history of  Type II Diabetes Genitourinary Denies history of End Stage Renal Disease Integumentary (Skin) Denies history of  History of Burn Musculoskeletal Patient has history of Osteoarthritis Oncologic Patient has history of Received Radiation Denies history of Received Chemotherapy Psychiatric Denies history of Hollace [place] Confinement Anxiety LAJUANNA, GENNETTE V ([id number]) [id number] Page 5 of 7 Hospitalization/Surgery History - bilk knee replacements. - hysterectomy. - left breast lumpectomy. - left achilles repair. - right achilles repair. - c-section. - neck surgery-pinched spinal cord. Medical A Surgical History Notes nd Constitutional Symptoms (General Health) morbid obesity Genitourinary urinary incontinence Oncologic hx left breast CA with recurring metastatic disease, hx uterine CA Objective Constitutional respirations regular, non-labored and within target range for patient.. Vitals Time Taken: 9:01 AM, Temperature: 98.2 F, Pulse: 76 bpm, Respiratory Rate: 18 breaths/min, Blood Pressure: 116/83 mmHg. Cardiovascular 2+ dorsalis pedis/posterior tibialis pulses. Psychiatric pleasant and cooperative. General Notes: T the intergluteal cleft there is an open wound limited to skin breakdown o Integumentary (Hair, Skin) Wound #2 status is Open. Original cause of wound was Gradually Appeared. The date acquired was: 10/19/2005. The wound has been in treatment 19 weeks. The wound is located on the Sacrum. The wound measures 3cm length x 0.2cm width x 0.1cm depth; 0.471cm^2 area and 0.047cm^3 volume. There is Fat Layer (Subcutaneous Tissue) exposed. There is no tunneling or undermining noted. There is a medium amount of serosanguineous drainage noted. The wound margin is distinct with the outline attached to the wound base. There is large (67-100%) red, pink granulation within the wound bed. There is a small (1-33%) amount of necrotic tissue within the  wound bed. The periwound skin appearance did not exhibit: Callus, Crepitus, Excoriation, Induration, Rash, Scarring, Dry/Scaly, Maceration, Atrophie Blanche, Cyanosis, Ecchymosis, Hemosiderin Staining, Mottled, Pallor, Rubor, Erythema. Periwound temperature was noted as No Abnormality. The periwound has tenderness on palpation. Assessment Active Problems ICD-10 Type 2 diabetes mellitus with other skin ulcer Non-pressure chronic ulcer of buttock with fat layer exposed Patient's wound is overall stable. At this time I recommended clotrimazole/betamethasone to see if this will help heal the gluteal cleft skin breakdown. Plan Follow-up Appointments: Return Appointment in 2 weeks. - Dr. Heber [place] 01/01/2023 Friday 0845 Other: - Pick up ointment at your pharmacy today. Good Rx coupon given. Bathing/ Shower/ Hygiene: May shower and wash wound with soap and water. - use anacept spray cleanse wound after shower. Off-Loading: Turn and reposition every 2 hours The following medication(s) was prescribed: clotrimazole-betamethasone topical 1 %-0.05 % cream 1 1 cream topical once daily to the affected area starting 12/18/2022 WOUND #2: - Sacrum Wound Laterality: Cleanser: Anasept Antimicrobial Skin and Wound Cleanser, 8 (oz) 1 x Per Day/30 Days Discharge Instructions: Cleanse the wound with Anasept cleanser prior to applying a clean dressing using gauze sponges, not tissue or cotton balls. Topical: clotrimazole/ betamethasome 1 x Per Day/30 Days Discharge Instructions: apply to wound daily. Secondary Dressing: Woven Gauze Sponge, Non-Sterile 4x4 in (Generic) 1 x Per Day/30 Days Discharge Instructions: Apply over primary dressing as directed. EULLA, FARRAJ ([id number]) [id number] Page 6 of 7 1. clotrimazole/betamethasone 2. follow up in 2 weeks Electronic Signature(s) Signed: 12/18/2022 12:11:35 PM By: Kalman Shan DO Entered By: Kalman Shan on 12/18/2022  10:07:03 -------------------------------------------------------------------------------- HxROS Details Patient Name: Date of Service: Jacqueline Lyons V. 12/18/2022 8:45 A M Medical Record Number: [id number] Patient Account Number: [id number] Date of Birth/Sex: Treating RN: November 25, 1955 (67 y.o. F) Primary Care Provider: Cathlean Cower Other Clinician: Referring Provider: Treating Provider/Extender: Hollie Beach in Treatment: 19 Information Obtained From Patient Constitutional Symptoms (General Health) Medical History: Past Medical History Notes: morbid  obesity Hematologic/Lymphatic Medical History: Positive for: Anemia; Lymphedema Respiratory Medical History: Positive for: Sleep Apnea - no CPAP Cardiovascular Medical History: Positive for: Hypertension Endocrine Medical History: Positive for: Type II Diabetes Time with diabetes: 5 yrs Treated with: Oral agents Blood sugar tested every day: No Genitourinary Medical History: Negative for: End Stage Renal Disease Past Medical History Notes: urinary incontinence Integumentary (Skin) Medical History: Negative for: History of Burn Musculoskeletal Medical History: Positive for: Osteoarthritis Oncologic Medical History: Positive for: Received Radiation Negative for: Received Chemotherapy Past Medical History Notes: hx left breast CA with recurring metastatic disease, hx uterine CA DARILYN, NOLTE V ([id number]) [id number] Page 7 of 7 Psychiatric Medical History: Negative for: Anorexia/bulimia; Confinement Anxiety Immunizations Pneumococcal Vaccine: Received Pneumococcal Vaccination: Yes Received Pneumococcal Vaccination On or After 60th Birthday: Yes Implantable Devices Yes Hospitalization / Surgery History Type of Hospitalization/Surgery bilk knee replacements hysterectomy left breast lumpectomy left achilles repair right achilles repair c-section neck  surgery-pinched spinal cord Family and Social History Cancer: No; Diabetes: Yes - Paternal Grandparents,Mother,Father; Heart Disease: Yes - Father; Hereditary Spherocytosis: No; Hypertension: Yes - Mother,Father; Kidney Disease: No; Lung Disease: No; Seizures: No; Stroke: No; Thyroid Problems: No; Tuberculosis: No; Never smoker; Marital Status - Divorced; Alcohol Use: Rarely; Drug Use: Prior History - TCH; Caffeine Use: Daily - tea; Financial Concerns: No; Food, Clothing or Shelter Needs: No; Support System Lacking: No; Transportation Concerns: Yes - will have multiple appointments for breast CA Electronic Signature(s) Signed: 12/18/2022 12:11:35 PM By: Kalman Shan DO Entered By: Kalman Shan on 12/18/2022 09:50:44 -------------------------------------------------------------------------------- SuperBill Details Patient Name: Date of Service: [name] 12/18/2022 Medical Record Number: [id number] Patient Account Number: [id number] Date of Birth/Sex: Treating RN: [birth year] (67 y.o. Debby Bud Primary Care Provider: Cathlean Cower Other Clinician: Referring Provider: Treating Provider/Extender: Hollie Beach in Treatment: 19 Diagnosis Coding ICD-10 Codes Code Description E11.622 Type 2 diabetes mellitus with other skin ulcer L98.412 Non-pressure chronic ulcer of buttock with fat layer exposed Facility Procedures : CPT4 Code: [id number] Description: 99213 - WOUND CARE VISIT-LEV 3 EST PT Modifier: Quantity: 1 Physician Procedures : CPT4 Code Description Modifier [id number] - WC PHYS LEVEL 3 - EST PT ICD-10 Diagnosis Description E11.622 Type 2 diabetes mellitus with other skin ulcer L98.412 Non-pressure chronic ulcer of buttock with fat layer exposed Quantity: 1 Electronic Signature(s) Signed: 12/18/2022 12:11:35 PM By: Kalman Shan DO Entered By: Kalman Shan on 12/18/2022 10:07:13

## 2022-12-21 NOTE — Telephone Encounter (Signed)
Has this patient been scheduled for the Cardiac MRI? Just following up on staff message. Thanks!

## 2022-12-22 NOTE — Telephone Encounter (Signed)
Patient has called again about not having this scheduled although authorized. She would like a phone call to schedule this asap. Can someone check on this? I saw previous messages and wanted to make sure she has been contacted.  Thanks!

## 2022-12-24 NOTE — Progress Notes (Addendum)
TINNA, ABBATE ([id number]) [id number] Page 1 of 7 Visit Report for 12/18/2022 Arrival Information Details Patient Name: Date of Service: [name] 12/18/2022 8:45 A M Medical Record Number: [id number] Patient Account Number: [id number] Date of Birth/Sex: Treating RN: [birth year] (67 y.o. F) Primary Care Maliek Schellhorn: Cathlean Cower Other Clinician: Referring Trampus Mcquerry: Treating Cobey Raineri/Extender: Hollie Beach in Treatment: 61 Visit Information History Since Last Visit Added or deleted any medications: No Patient Arrived: Cane Any new allergies or adverse reactions: No Arrival Time: 08:49 Had a fall or experienced change in No Accompanied By: self activities of daily living that may affect Transfer Assistance: None risk of falls: Patient Identification Verified: Yes Signs or symptoms of abuse/neglect since last visito No Secondary Verification Process Completed: Yes Hospitalized since last visit: No Patient Requires Transmission-Based Precautions: No Implantable device outside of the clinic excluding No Patient Has Alerts: No cellular tissue based products placed in the center since last visit: Has Dressing in Place as Prescribed: Yes Pain Present Now: No Electronic Signature(s) Signed: 12/22/2022 3:59:08 PM By: Erenest Blank Entered By: Erenest Blank on 12/18/2022 09:01:08 -------------------------------------------------------------------------------- Clinic Level of Care Assessment Details Patient Name: Date of Service: [name] 12/18/2022 8:45 A M Medical Record Number: [id number] Patient Account Number: [id number] Date of Birth/Sex: Treating RN: 07-22-1956 (67 y.o. Helene Shoe, Meta.Reding Primary Care Tamsen Reist: Cathlean Cower Other Clinician: Referring Muscab Brenneman: Treating Salli Bodin/Extender: Hollie Beach in Treatment: 19 Clinic Level of Care Assessment Items TOOL 4 Quantity Score X- 1  0 Use when only an EandM is performed on FOLLOW-UP visit ASSESSMENTS - Nursing Assessment / Reassessment X- 1 10 Reassessment of Co-morbidities (includes updates in patient status) X- 1 5 Reassessment of Adherence to Treatment Plan ASSESSMENTS - Wound and Skin A ssessment / Reassessment X - Simple Wound Assessment / Reassessment - one wound 1 5 '[]'$  - 0 Complex Wound Assessment / Reassessment - multiple wounds X- 1 10 Dermatologic / Skin Assessment (not related to wound area) ASSESSMENTS - Focused Assessment '[]'$  - 0 Circumferential Edema Measurements - multi extremities '[]'$  - 0 Nutritional Assessment / Counseling / Intervention '[]'$  - 0 Lower Extremity Assessment (monofilament, tuning fork, pulses) '[]'$  - 0 Peripheral Arterial Disease Assessment (using hand held doppler) ASSESSMENTS - Ostomy and/or Continence Assessment and Care '[]'$  - 0 Incontinence Assessment and Management '[]'$  - 0 Ostomy Care Assessment and Management (repouching, etc.) PROCESS - Coordination of Care X - Simple Patient / Family Education for ongoing care 1 15 [name], [name] ([id number]) [id number] Page 2 of 7 '[]'$  - 0 Complex (extensive) Patient / Family Education for ongoing care X- 1 10 Staff obtains Programmer, systems, Records, T Results / Process Orders est '[]'$  - 0 Staff telephones HHA, Nursing Homes / Clarify orders / etc '[]'$  - 0 Routine Transfer to another Facility (non-emergent condition) '[]'$  - 0 Routine Hospital Admission (non-emergent condition) '[]'$  - 0 New Admissions / Biomedical engineer / Ordering NPWT Apligraf, etc. , '[]'$  - 0 Emergency Hospital Admission (emergent condition) X- 1 10 Simple Discharge Coordination '[]'$  - 0 Complex (extensive) Discharge Coordination PROCESS - Special Needs '[]'$  - 0 Pediatric / Minor Patient Management '[]'$  - 0 Isolation Patient Management '[]'$  - 0 Hearing / Language / Visual special needs '[]'$  - 0 Assessment of Community assistance  (transportation, D/C planning, etc.) '[]'$  - 0 Additional assistance / Altered mentation '[]'$  - 0 Support Surface(s) Assessment (bed, cushion, seat, etc.) INTERVENTIONS - Wound Cleansing / Measurement X - Simple  Wound Cleansing - one wound 1 5 '[]'$  - 0 Complex Wound Cleansing - multiple wounds X- 1 5 Wound Imaging (photographs - any number of wounds) '[]'$  - 0 Wound Tracing (instead of photographs) X- 1 5 Simple Wound Measurement - one wound '[]'$  - 0 Complex Wound Measurement - multiple wounds INTERVENTIONS - Wound Dressings X - Small Wound Dressing one or multiple wounds 1 10 '[]'$  - 0 Medium Wound Dressing one or multiple wounds '[]'$  - 0 Large Wound Dressing one or multiple wounds X- 1 5 Application of Medications - topical '[]'$  - 0 Application of Medications - injection INTERVENTIONS - Miscellaneous '[]'$  - 0 External ear exam '[]'$  - 0 Specimen Collection (cultures, biopsies, blood, body fluids, etc.) '[]'$  - 0 Specimen(s) / Culture(s) sent or taken to Lab for analysis '[]'$  - 0 Patient Transfer (multiple staff / Civil Service fast streamer / Similar devices) '[]'$  - 0 Simple Staple / Suture removal (25 or less) '[]'$  - 0 Complex Staple / Suture removal (26 or more) '[]'$  - 0 Hypo / Hyperglycemic Management (close monitor of Blood Glucose) '[]'$  - 0 Ankle / Brachial Index (ABI) - do not check if billed separately X- 1 5 Vital Signs Has the patient been seen at the hospital within the last three years: Yes Total Score: 100 Level Of Care: New/Established - Level 3 Electronic Signature(s) Signed: 12/18/2022 3:08:51 PM By: Deon Pilling RN, BSN Entered By: Deon Pilling on 12/18/2022 09:48:30 [name] ([id number]) [id number] Page 3 of 7 -------------------------------------------------------------------------------- Encounter Discharge Information Details Patient Name: Date of Service: [name] 12/18/2022 8:45 A M Medical Record Number: [id number] Patient Account  Number: [id number] Date of Birth/Sex: Treating RN: [birth year] (67 y.o. Helene Shoe, Tammi Klippel Primary Care Greidy Sherard: Cathlean Cower Other Clinician: Referring Kashif Pooler: Treating Rayleigh Gillyard/Extender: Hollie Beach in Treatment: 19 Encounter Discharge Information Items Discharge Condition: Stable Ambulatory Status: Ambulatory Discharge Destination: Home Transportation: Private Auto Accompanied By: self Schedule Follow-up Appointment: Yes Clinical Summary of Care: Electronic Signature(s) Signed: 12/18/2022 3:08:51 PM By: Deon Pilling RN, BSN Entered By: Deon Pilling on 12/18/2022 09:49:28 -------------------------------------------------------------------------------- Lower Extremity Assessment Details Patient Name: Date of Service: Jacqueline Lyons Young. 12/18/2022 8:45 A M Medical Record Number: [id number] Patient Account Number: [id number] Date of Birth/Sex: Treating RN: 11-14-1955 (67 y.o. F) Primary Care Codey Burling: Cathlean Cower Other Clinician: Referring Sierah Lacewell: Treating Jonika Critz/Extender: Hollie Beach in Treatment: 19 Electronic Signature(s) Signed: 12/22/2022 3:59:08 PM By: Erenest Blank Entered By: Erenest Blank on 12/18/2022 09:03:16 -------------------------------------------------------------------------------- Multi Wound Chart Details Patient Name: Date of Service: Jacqueline Lyons Young. 12/18/2022 8:45 A M Medical Record Number: [id number] Patient Account Number: [id number] Date of Birth/Sex: Treating RN: [birth year] (67 y.o. F) Primary Care Leobardo Granlund: Cathlean Cower Other Clinician: Referring Gilberta Peeters: Treating Jema Deegan/Extender: Hollie Beach in Treatment: 19 Vital Signs Height(in): Pulse(bpm): 19 Weight(lbs): Blood Pressure(mmHg): 116/83 Body Mass Index(BMI): Temperature(F): 98.2 Respiratory Rate(breaths/min): 18 [2:Photos:] [N/A:N/A] [name], [name] ([id number]) [2:Sacrum Wound Location:  Gradually Appeared Wounding Event: MASD Primary Etiology: Anemia, Lymphedema, Sleep Apnea, N/A Comorbid History: Hypertension, Type II Diabetes, Osteoarthritis, Received Radiation 10/19/2005 Date Acquired: 19 Weeks of Treatment:  Open Wound Status: No Wound Recurrence: 3x0.2x0.1 Measurements L x W x D (cm) 0.471 A (cm) : rea 0.047 Volume (cm) : 83.30% % Reduction in Area: 91.70% % Reduction in Volume: Full Thickness With Exposed Support N/A Classification: Structures Medium  Exudate Amount: Serosanguineous Exudate Type: red, brown Exudate Color: Distinct, outline attached Wound Margin:  Large (67-100%) Granulation Amount: Red, Pink Granulation Quality: Small (1-33%) Necrotic Amount: Fat Layer (Subcutaneous Tissue): Yes N/A  Exposed Structures: Fascia: No Tendon: No Muscle: No Joint: No Bone: No Small (1-33%) Epithelialization: Excoriation: No Periwound Skin Texture: Induration: No Callus: No Crepitus: No Rash: No Scarring: No Maceration: No Periwound Skin Moisture:  Dry/Scaly: No Atrophie Blanche: No Periwound Skin Color: Cyanosis: No Ecchymosis: No Erythema: No Hemosiderin Staining: No Mottled: No Pallor: No Rubor: No No Abnormality Temperature: Yes Tenderness on Palpation:] [N/A:N/A N/A N/A N/A N/A N/A N/A N/A N/A  N/A N/A N/A N/A N/A N/A N/A N/A N/A N/A N/A N/A N/A N/A N/A N/A] Treatment Notes Electronic Signature(s) Signed: 12/18/2022 12:11:35 PM By: Geralyn Corwin DO Entered By: Geralyn Corwin on 12/18/2022 09:47:59 -------------------------------------------------------------------------------- Multi-Disciplinary Care Plan Details Patient Name: Date of Service: Jacqueline Memos Young. 12/18/2022 8:45 A M Medical Record Number: [id number] Patient Account Number: [id number] Date of Birth/Sex: Treating RN: 1956/04/24 (67 y.o. Debara Pickett, Millard.Loa Primary Care Jettson Crable: Oliver Young Other Clinician: Referring Kavontae Pritchard: Treating Nanie Dunkleberger/Extender: Herma Carson in Treatment:  19 Active Inactive Electronic Signature(s) Signed: 02/23/2023 11:56:51 AM By: Shawn Stall RN, BSN Previous Signature: 12/18/2022 3:08:51 PM Version By: Shawn Stall RN, BSN Entered By: Shawn Stall on 02/23/2023 11:56:51 [name] ([id number]) [id number] Page 5 of 7 -------------------------------------------------------------------------------- Pain Assessment Details Patient Name: Date of Service: [name] 12/18/2022 8:45 A M Medical Record Number: [id number] Patient Account Number: [id number] Date of Birth/Sex: Treating RN: 04-24-1956 (67 y.o. F) Primary Care Jensyn Shave: Oliver Young Other Clinician: Referring Neville Walston: Treating Ikram Riebe/Extender: Herma Carson in Treatment: 19 Active Problems Location of Pain Severity and Description of Pain Patient Has Paino No Site Locations Pain Management and Medication Current Pain Management: Electronic Signature(s) Signed: 12/22/2022 3:59:08 PM By: Thayer Dallas Entered By: Thayer Dallas on 12/18/2022 09:02:16 -------------------------------------------------------------------------------- Patient/Caregiver Education Details Patient Name: Date of Service: [name] 3/1/2024andnbsp8:45 A M Medical Record Number: [id number] Patient Account Number: [id number] Date of Birth/Gender: Treating RN: [birth year] (67 y.o. Arta Silence Primary Care Physician: Oliver Young Other Clinician: Referring Physician: Treating Physician/Extender: Herma Carson in Treatment: 19 Education Assessment Education Provided To: Patient Education Topics Provided Wound/Skin Impairment: Handouts: Caring for Your Ulcer Methods: Explain/Verbal Responses: Reinforcements needed Electronic Signature(s) Signed: 12/18/2022 3:08:51 PM By: Shawn Stall RN, BSN Entered By: Shawn Stall on 12/18/2022 09:37:29 [name] ([id number])  [id number] Page 6 of 7 -------------------------------------------------------------------------------- Wound Assessment Details Patient Name: Date of Service: [name] 12/18/2022 8:45 A M Medical Record Number: [id number] Patient Account Number: [id number] Date of Birth/Sex: Treating RN: 01-Mar-1956 (67 y.o. F) Primary Care Emmett Bracknell: Oliver Young Other Clinician: Referring Janavia Rottman: Treating Kemiya Batdorf/Extender: Herma Carson in Treatment: 19 Wound Status Wound Number: 2 Primary MASD Etiology: Wound Location: Sacrum Wound Open Wounding Event: Gradually Appeared Status: Date Acquired: 10/19/2005 Comorbid Anemia, Lymphedema, Sleep Apnea, Hypertension, Type II Weeks Of Treatment: 19 History: Diabetes, Osteoarthritis, Received Radiation Clustered Wound: No Photos Wound Measurements Length: (cm) 3 Width: (cm) 0.2 Depth: (cm) 0.1 Area: (cm) 0.471 Volume: (cm) 0.047 % Reduction in Area: 83.3% % Reduction in Volume: 91.7% Epithelialization: Small (1-33%) Tunneling: No Undermining: No Wound Description Classification: Full Thickness With Exposed Suppor Wound Margin: Distinct, outline attached Exudate Amount: Medium Exudate Type: Serosanguineous Exudate Color: red, brown t Structures Foul Odor After Cleansing: No Slough/Fibrino Yes Wound Bed Granulation Amount: Large (67-100%) Exposed Structure Granulation  Quality: Red, Pink Fascia Exposed: No Necrotic Amount: Small (1-33%) Fat Layer (Subcutaneous Tissue) Exposed: Yes Tendon Exposed: No Muscle Exposed: No Joint Exposed: No Bone Exposed: No Periwound Skin Texture Texture Color No Abnormalities Noted: No No Abnormalities Noted: No Callus: No Atrophie Blanche: No Crepitus: No Cyanosis: No Excoriation: No Ecchymosis: No Induration: No Erythema: No Rash: No Hemosiderin Staining: No Scarring: No Mottled: No Pallor: No Moisture Rubor: No No  Abnormalities Noted: No Dry / Scaly: No Temperature / Pain Maceration: No Temperature: No Abnormality Tenderness on Palpation: Yes Electronic Signature(s) Signed: 12/22/2022 3:59:08 PM By: Thayer Dallas Entered By: Thayer Dallas on 12/18/2022 09:13:46 [name] ([id number]) [id number] Page 7 of 7 -------------------------------------------------------------------------------- Vitals Details Patient Name: Date of Service: [name] 12/18/2022 8:45 A M Medical Record Number: [id number] Patient Account Number: [id number] Date of Birth/Sex: Treating RN: 09-Feb-1956 (67 y.o. F) Primary Care Ashanti Littles: Oliver Young Other Clinician: Referring Emmah Bratcher: Treating Elaisha Zahniser/Extender: Herma Carson in Treatment: 19 Vital Signs Time Taken: 09:01 Temperature (F): 98.2 Pulse (bpm): 76 Respiratory Rate (breaths/min): 18 Blood Pressure (mmHg): 116/83 Reference Range: 80 - 120 mg / dl Electronic Signature(s) Signed: 12/22/2022 3:59:08 PM By: Thayer Dallas Entered By: Thayer Dallas on 12/18/2022 09:01:43

## 2023-01-01 ENCOUNTER — Ambulatory Visit (HOSPITAL_BASED_OUTPATIENT_CLINIC_OR_DEPARTMENT_OTHER): Payer: Medicare Other | Admitting: Internal Medicine

## 2023-01-04 ENCOUNTER — Encounter (HOSPITAL_COMMUNITY)
Admission: RE | Admit: 2023-01-04 | Discharge: 2023-01-04 | Disposition: A | Discharge status: Home / Self Care | Payer: Medicare Other | Source: Ambulatory Visit | Attending: Cardiology | Admitting: Cardiology

## 2023-01-04 ENCOUNTER — Ambulatory Visit: Payer: Medicare Other | Admitting: Registered"

## 2023-01-04 DIAGNOSIS — E8589 Other amyloidosis: Secondary | ICD-10-CM | POA: Diagnosis not present

## 2023-01-04 MED ORDER — TECHNETIUM TC 99M PYROPHOSPHATE
20.7000 | Freq: Once | INTRAVENOUS | Status: DC | PRN
  Filled 2023-01-04: qty 21

## 2023-01-06 ENCOUNTER — Other Ambulatory Visit: Payer: Self-pay | Admitting: Hematology and Oncology

## 2023-01-06 DIAGNOSIS — C50112 Malignant neoplasm of central portion of left female breast: Secondary | ICD-10-CM

## 2023-01-06 DIAGNOSIS — C50812 Malignant neoplasm of overlapping sites of left female breast: Secondary | ICD-10-CM

## 2023-01-07 ENCOUNTER — Other Ambulatory Visit: Payer: Self-pay | Admitting: *Deleted

## 2023-01-07 ENCOUNTER — Inpatient Hospital Stay: Payer: Medicare Other | Attending: Hematology and Oncology

## 2023-01-07 ENCOUNTER — Telehealth (HOSPITAL_COMMUNITY): Payer: Self-pay

## 2023-01-07 ENCOUNTER — Inpatient Hospital Stay: Payer: Medicare Other

## 2023-01-07 VITALS — BP 114/66 | HR 67 | Temp 97.9°F | Resp 19 | Wt 232.5 lb

## 2023-01-07 DIAGNOSIS — Z171 Estrogen receptor negative status [ER-]: Secondary | ICD-10-CM

## 2023-01-07 DIAGNOSIS — C773 Secondary and unspecified malignant neoplasm of axilla and upper limb lymph nodes: Secondary | ICD-10-CM | POA: Insufficient documentation

## 2023-01-07 DIAGNOSIS — Z5112 Encounter for antineoplastic immunotherapy: Secondary | ICD-10-CM | POA: Insufficient documentation

## 2023-01-07 DIAGNOSIS — E8589 Other amyloidosis: Secondary | ICD-10-CM

## 2023-01-07 DIAGNOSIS — C50812 Malignant neoplasm of overlapping sites of left female breast: Secondary | ICD-10-CM | POA: Diagnosis present

## 2023-01-07 DIAGNOSIS — Z95828 Presence of other vascular implants and grafts: Secondary | ICD-10-CM

## 2023-01-07 LAB — CBC WITH DIFFERENTIAL (CANCER CENTER ONLY)
Abs Immature Granulocytes: 0.02 10*3/uL (ref 0.00–0.07)
Basophils Absolute: 0 10*3/uL (ref 0.0–0.1)
Basophils Relative: 0 %
Eosinophils Absolute: 0.1 10*3/uL (ref 0.0–0.5)
Eosinophils Relative: 2 %
HCT: 30.9 % — ABNORMAL LOW (ref 36.0–46.0)
Hemoglobin: 10 g/dL — ABNORMAL LOW (ref 12.0–15.0)
Immature Granulocytes: 0 %
Lymphocytes Relative: 26 %
Lymphs Abs: 1.4 10*3/uL (ref 0.7–4.0)
MCH: 29.1 pg (ref 26.0–34.0)
MCHC: 32.4 g/dL (ref 30.0–36.0)
MCV: 89.8 fL (ref 80.0–100.0)
Monocytes Absolute: 0.6 10*3/uL (ref 0.1–1.0)
Monocytes Relative: 12 %
Neutro Abs: 3.2 10*3/uL (ref 1.7–7.7)
Neutrophils Relative %: 60 %
Platelet Count: 205 10*3/uL (ref 150–400)
RBC: 3.44 MIL/uL — ABNORMAL LOW (ref 3.87–5.11)
RDW: 14 % (ref 11.5–15.5)
WBC Count: 5.4 10*3/uL (ref 4.0–10.5)
nRBC: 0 % (ref 0.0–0.2)

## 2023-01-07 LAB — CMP (CANCER CENTER ONLY)
ALT: 16 U/L (ref 0–44)
AST: 21 U/L (ref 15–41)
Albumin: 3.8 g/dL (ref 3.5–5.0)
Alkaline Phosphatase: 44 U/L (ref 38–126)
Anion gap: 8 (ref 5–15)
BUN: 25 mg/dL — ABNORMAL HIGH (ref 8–23)
CO2: 26 mmol/L (ref 22–32)
Calcium: 9.2 mg/dL (ref 8.9–10.3)
Chloride: 105 mmol/L (ref 98–111)
Creatinine: 1.18 mg/dL — ABNORMAL HIGH (ref 0.44–1.00)
GFR, Estimated: 52 mL/min — ABNORMAL LOW (ref >60)
Glucose, Bld: 91 mg/dL (ref 70–99)
Potassium: 3.4 mmol/L — ABNORMAL LOW (ref 3.5–5.1)
Sodium: 139 mmol/L (ref 135–145)
Total Bilirubin: 0.3 mg/dL (ref 0.3–1.2)
Total Protein: 7.6 g/dL (ref 6.5–8.1)

## 2023-01-07 MED ORDER — SODIUM CHLORIDE 0.9% FLUSH
10.0000 mL | INTRAVENOUS | Status: DC | PRN
  Administered 2023-01-07: 10 mL

## 2023-01-07 MED ORDER — TRASTUZUMAB-ANNS CHEMO 150 MG IV SOLR
6.0000 mg/kg | Freq: Once | INTRAVENOUS | Status: AC
  Administered 2023-01-07: 600 mg via INTRAVENOUS
  Filled 2023-01-07: qty 28.57

## 2023-01-07 MED ORDER — SODIUM CHLORIDE 0.9 % IV SOLN: Freq: Once | INTRAVENOUS | Status: AC

## 2023-01-07 MED ORDER — ACETAMINOPHEN 325 MG PO TABS
650.0000 mg | ORAL_TABLET | Freq: Once | ORAL | Status: AC
  Administered 2023-01-07: 650 mg via ORAL
  Filled 2023-01-07: qty 2

## 2023-01-07 MED ORDER — DIPHENHYDRAMINE HCL 25 MG PO CAPS
25.0000 mg | ORAL_CAPSULE | Freq: Once | ORAL | Status: AC
  Administered 2023-01-07: 25 mg via ORAL
  Filled 2023-01-07: qty 1

## 2023-01-07 MED ORDER — SODIUM CHLORIDE 0.9% FLUSH
10.0000 mL | Freq: Once | INTRAVENOUS | Status: AC
  Administered 2023-01-07: 10 mL

## 2023-01-07 MED ORDER — HEPARIN SOD (PORK) LOCK FLUSH 100 UNIT/ML IV SOLN
500.0000 [IU] | Freq: Once | INTRAVENOUS | Status: AC | PRN
  Administered 2023-01-07: 500 [IU]

## 2023-01-07 NOTE — Telephone Encounter (Signed)
-----   Message from Larey Dresser, MD sent at 01/05/2023  4:08 PM EDT ----- Equivocal study for cardiac amyloidosis.  Would like her to have cardiac MRI to see if this is consistent with amyloidosis prior to deciding on +/- treatment.

## 2023-01-07 NOTE — Telephone Encounter (Signed)
Patient advised and verbalized understanding. Order entered, jasmine can you go head and get the auth for a cardiac MRI   Orders Placed This Encounter  Procedures   MR CARDIAC MORPHOLOGY W WO CONTRAST    Standing Status:   Future    Standing Expiration Date:   01/07/2024    Order Specific Question:   If indicated for the ordered procedure, I authorize the administration of contrast media per Radiology protocol    Answer:   Yes    Order Specific Question:   What is the patient's sedation requirement?    Answer:   No Sedation    Order Specific Question:   Does the patient have a pacemaker or implanted devices?    Answer:   No    Order Specific Question:   Preferred imaging location?    Answer:   [facility] (table limit - 500 lbs)    Order Specific Question:   Release to patient    Answer:   Immediate

## 2023-01-07 NOTE — Progress Notes (Signed)
Pt reported a fall yesterday and increasing near misses. Pt reports falling on her face while home alone and had to use furniture to pull herself up. 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 Pt also reported concern that the fluid in her abdomen was now in her lungs. Upon auscultation, this RN noted normal heart rhythm and rate, and lungs were clear. This RN alerted Dr Chryl Heck and Mateo Flow RN, and received the ok to treat. Pt advised to f/u with PCP.

## 2023-01-07 NOTE — Patient Instructions (Signed)
[name]  Discharge Instructions: Thank you for choosing [facility] to provide your oncology and hematology care.   If you have a lab appointment with the [facility], please go directly to the [facility] and check in at the registration area.   Wear comfortable clothing and clothing appropriate for easy access to any Portacath or PICC line.   We strive to give you quality time with your provider. You may need to reschedule your appointment if you arrive late (15 or more minutes).  Arriving late affects you and other patients whose appointments are after yours.  Also, if you miss three or more appointments without notifying the office, you may be dismissed from the clinic at the provider's discretion.      For prescription refill requests, have your pharmacy contact our office and allow 72 hours for refills to be completed.    Today you received the following chemotherapy and/or immunotherapy agents Kanjiti      To help prevent nausea and vomiting after your treatment, we encourage you to take your nausea medication as directed.  BELOW ARE SYMPTOMS THAT SHOULD BE REPORTED IMMEDIATELY: *FEVER GREATER THAN 100.4 F (38 C) OR HIGHER *CHILLS OR SWEATING *NAUSEA AND VOMITING THAT IS NOT CONTROLLED WITH YOUR NAUSEA MEDICATION *UNUSUAL SHORTNESS OF BREATH *UNUSUAL BRUISING OR BLEEDING *URINARY PROBLEMS (pain or burning when urinating, or frequent urination) *BOWEL PROBLEMS (unusual diarrhea, constipation, pain near the anus) TENDERNESS IN MOUTH AND THROAT WITH OR WITHOUT PRESENCE OF ULCERS (sore throat, sores in mouth, or a toothache) UNUSUAL RASH, SWELLING OR PAIN  UNUSUAL VAGINAL DISCHARGE OR ITCHING   Items with * indicate a potential emergency and should be followed up as soon as possible or go to the Emergency Department if any problems should occur.  Please show the CHEMOTHERAPY ALERT CARD or IMMUNOTHERAPY ALERT CARD at check-in  to the Emergency Department and triage nurse.  Should you have questions after your visit or need to cancel or reschedule your appointment, please contact [facility]  Dept: [phone]  and follow the prompts.  Office hours are 8:00 a.m. to 4:30 p.m. Monday - Friday. Please note that voicemails left after 4:00 p.m. may not be returned until the following business day.  We are closed weekends and major holidays. You have access to a nurse at all times for urgent questions. Please call the main number to the clinic Dept: [phone] and follow the prompts.   For any non-urgent questions, you may also contact your provider using MyChart. We now offer e-Visits for anyone 80 and older to request care online for non-urgent symptoms. For details visit [email or web address].   Also download the MyChart app! Go to the app store, search "MyChart", open the app, select [facility], and log in with your MyChart username and password.

## 2023-01-07 NOTE — Progress Notes (Signed)
Pt ok to tx with echo from 11/28. Echo needed q 6 months.

## 2023-01-08 ENCOUNTER — Encounter: Payer: Medicare Other | Attending: Adult Health | Admitting: Registered"

## 2023-01-08 ENCOUNTER — Encounter: Payer: Self-pay | Admitting: Registered"

## 2023-01-08 VITALS — Wt 231.2 lb

## 2023-01-08 DIAGNOSIS — E119 Type 2 diabetes mellitus without complications: Secondary | ICD-10-CM | POA: Diagnosis present

## 2023-01-08 NOTE — Patient Instructions (Addendum)
Your insurance may cover CGM - [facility] [place] 2 or 3) if you have asymptomatic hypoglycemia and also because of your neuropathy making it difficult to do finger pricks.  At your next physical therapy appointment ask about exercises for balance. Consider looking into getting a monitor for falls (first alert)  Checking in with yourself when having cravings. Write down some notes about how long it has been since eating, your stress level, etc.  Aim to start your day with some protein within 2 hours of waking up

## 2023-01-08 NOTE — Progress Notes (Signed)
Diabetes Self-Management Education  Visit Type:  follow-up  Appt. Start Time: 1040 Appt. End Time: [id number]  01/08/2023  [name], identified by name and date of birth, is a 67 y.o. female with a diagnosis of Diabetes:  [name] Type 2  ASSESSMENT  Weight 231 lb 3.2 oz (104.9 kg), last menstrual period 05/18/2016. Body mass index is 46.7 kg/m.  Wt Readings from Last 3 Encounters:  01/08/23 231 lb 3.2 oz (104.9 kg)  01/07/23 232 lb 8 oz (105.5 kg)  12/17/22 234 lb (106.1 kg)   Pt has lost 3 lbs since last visit.  Component Ref Range & Units [facility] (12/07/22) 4 mo ago (07/10/22) 1 yr ago (08/20/21) 1 yr ago (03/05/21) 2 yr ago (09/27/20) 2 yr ago (04/30/20) 3 yr ago (10/24/19) 3 yr ago (04/28/19)  Hgb A1c MFr Bld 4.6 - 6.5 % 5.8% 7.1 High  6.6 High  R, CM 5.9 CM 6.2 High  R, CM 5.7 Abnormal  R 7.3 High  CM 7.3 High  CM   DM Medication: Mounjaro 7.5/0.5 (pt states although Mounjaro dose was supposed to be 15 per her cardiac pharmacist, her pharmacy was not able to get and she is still using 7.5. SMBG: Pt not checking due to neuropathy  Pt states BG was 52 mg/dL at endocrinologist and she drank some soda and ate crackers but her blood sugar did not come up. Pt denied having hypoglycemic sxs and states the clinical staff thought her reading may have been inaccurate due to poor circulation in her fingers.   Pt reports this week she has not worked on goals to improve diet. Pt states she has been traveling and dealing with stressful situations with her mother's illness and her brother who died this week and she is making arrangements for his body to be moved to [place] from [place].   Pt states she has been eating fast food and drinking sweentened beverages, hasn't felt like cooking. Pt states she has been eating less because less appetite. Pt states before the medication she would be in her house and feel like she was always going to the kitchen looking for something to eat.    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   Pt reports the other day she fell in her house when she tripped on her cane and and since she cannot get up without help she had to crawl to a piece of furniture to pull herself up. Pt states she was not treated for an injury but feels sore in in hands and chest after the fall. Pt states has been going to physical therapy for her back, but has not had any for balance.  Education Topics: Stress / Caregiver resources CGM Hypoglycemia / sxs unawareness or inaccurate CBG? Physical therapy for balance exercises Craving triggers Protein for breakfast  Individualized Plan for Diabetes Self-Management Training:   Learning Objective:  Patient will have a greater understanding of diabetes self-management. Patient education plan is to attend individual and/or group sessions per assessed needs and concerns.   Patient Instructions  Your insurance may cover CGM - [facility] [place] 2 or 3) if you have asymptomatic hypoglycemia and also because of your neuropathy making it difficult to do finger pricks.  At your next physical therapy appointment ask about exercises for balance. Consider looking into getting  a monitor for falls (first alert)  Checking in with yourself when having cravings. Write down some notes about how long it has been since eating, your stress level, etc.  Aim to start your day with some protein within 2 hours of waking up  Expected Outcomes:   demonstrated interest in learning, expect positive outcomes.  Education material provided: [employer]  If problems or questions, patient to contact team via:  Best boy  Future DSME appointment:  3 months

## 2023-01-11 ENCOUNTER — Telehealth: Payer: Self-pay | Admitting: Pharmacist

## 2023-01-11 MED ORDER — MOUNJARO 10 MG/0.5ML ~~LOC~~ SOAJ: 10.0000 mg | SUBCUTANEOUS | 0 refills | Status: DC

## 2023-01-11 NOTE — Telephone Encounter (Signed)
Called pt to f/u, tolerating Mounjaro well but not noticing weight loss. Wants to increase to 10mg , previously pharmacy was unable to get this dose in stock. Will try sending in again, pt aware to let me know if pharmacy cannot get.

## 2023-01-19 ENCOUNTER — Other Ambulatory Visit: Payer: Self-pay

## 2023-01-28 ENCOUNTER — Other Ambulatory Visit: Payer: Self-pay

## 2023-01-28 ENCOUNTER — Inpatient Hospital Stay: Payer: Medicare Other

## 2023-01-28 ENCOUNTER — Telehealth: Payer: Self-pay | Admitting: Hematology and Oncology

## 2023-01-28 ENCOUNTER — Inpatient Hospital Stay: Payer: Medicare Other | Attending: Hematology and Oncology | Admitting: Hematology and Oncology

## 2023-01-28 VITALS — BP 135/66 | HR 57 | Resp 18

## 2023-01-28 DIAGNOSIS — C50112 Malignant neoplasm of central portion of left female breast: Secondary | ICD-10-CM

## 2023-01-28 DIAGNOSIS — Z90710 Acquired absence of both cervix and uterus: Secondary | ICD-10-CM | POA: Insufficient documentation

## 2023-01-28 DIAGNOSIS — Z90722 Acquired absence of ovaries, bilateral: Secondary | ICD-10-CM | POA: Diagnosis not present

## 2023-01-28 DIAGNOSIS — C50812 Malignant neoplasm of overlapping sites of left female breast: Secondary | ICD-10-CM | POA: Diagnosis present

## 2023-01-28 DIAGNOSIS — Z95828 Presence of other vascular implants and grafts: Secondary | ICD-10-CM

## 2023-01-28 DIAGNOSIS — D6481 Anemia due to antineoplastic chemotherapy: Secondary | ICD-10-CM | POA: Diagnosis not present

## 2023-01-28 DIAGNOSIS — T451X5A Adverse effect of antineoplastic and immunosuppressive drugs, initial encounter: Secondary | ICD-10-CM | POA: Diagnosis not present

## 2023-01-28 DIAGNOSIS — Z79899 Other long term (current) drug therapy: Secondary | ICD-10-CM | POA: Insufficient documentation

## 2023-01-28 DIAGNOSIS — Z171 Estrogen receptor negative status [ER-]: Secondary | ICD-10-CM | POA: Diagnosis not present

## 2023-01-28 DIAGNOSIS — Z9079 Acquired absence of other genital organ(s): Secondary | ICD-10-CM | POA: Insufficient documentation

## 2023-01-28 DIAGNOSIS — Z5112 Encounter for antineoplastic immunotherapy: Secondary | ICD-10-CM | POA: Diagnosis present

## 2023-01-28 DIAGNOSIS — C773 Secondary and unspecified malignant neoplasm of axilla and upper limb lymph nodes: Secondary | ICD-10-CM | POA: Insufficient documentation

## 2023-01-28 DIAGNOSIS — Z8542 Personal history of malignant neoplasm of other parts of uterus: Secondary | ICD-10-CM | POA: Diagnosis not present

## 2023-01-28 DIAGNOSIS — J069 Acute upper respiratory infection, unspecified: Secondary | ICD-10-CM | POA: Insufficient documentation

## 2023-01-28 LAB — CBC WITH DIFFERENTIAL (CANCER CENTER ONLY)
Abs Immature Granulocytes: 0.01 10*3/uL (ref 0.00–0.07)
Basophils Absolute: 0 10*3/uL (ref 0.0–0.1)
Basophils Relative: 1 %
Eosinophils Absolute: 0.1 10*3/uL (ref 0.0–0.5)
Eosinophils Relative: 2 %
HCT: 29.7 % — ABNORMAL LOW (ref 36.0–46.0)
Hemoglobin: 9.4 g/dL — ABNORMAL LOW (ref 12.0–15.0)
Immature Granulocytes: 0 %
Lymphocytes Relative: 31 %
Lymphs Abs: 1 10*3/uL (ref 0.7–4.0)
MCH: 28.8 pg (ref 26.0–34.0)
MCHC: 31.6 g/dL (ref 30.0–36.0)
MCV: 91.1 fL (ref 80.0–100.0)
Monocytes Absolute: 0.6 10*3/uL (ref 0.1–1.0)
Monocytes Relative: 18 %
Neutro Abs: 1.6 10*3/uL — ABNORMAL LOW (ref 1.7–7.7)
Neutrophils Relative %: 48 %
Platelet Count: 212 10*3/uL (ref 150–400)
RBC: 3.26 MIL/uL — ABNORMAL LOW (ref 3.87–5.11)
RDW: 14.6 % (ref 11.5–15.5)
WBC Count: 3.2 10*3/uL — ABNORMAL LOW (ref 4.0–10.5)
nRBC: 0 % (ref 0.0–0.2)

## 2023-01-28 LAB — CMP (CANCER CENTER ONLY)
ALT: 19 U/L (ref 0–44)
AST: 24 U/L (ref 15–41)
Albumin: 3.8 g/dL (ref 3.5–5.0)
Alkaline Phosphatase: 41 U/L (ref 38–126)
Anion gap: 6 (ref 5–15)
BUN: 21 mg/dL (ref 8–23)
CO2: 29 mmol/L (ref 22–32)
Calcium: 9.4 mg/dL (ref 8.9–10.3)
Chloride: 105 mmol/L (ref 98–111)
Creatinine: 1.07 mg/dL — ABNORMAL HIGH (ref 0.44–1.00)
GFR, Estimated: 58 mL/min — ABNORMAL LOW (ref >60)
Glucose, Bld: 97 mg/dL (ref 70–99)
Potassium: 3.6 mmol/L (ref 3.5–5.1)
Sodium: 140 mmol/L (ref 135–145)
Total Bilirubin: 0.4 mg/dL (ref 0.3–1.2)
Total Protein: 7.7 g/dL (ref 6.5–8.1)

## 2023-01-28 MED ORDER — ACETAMINOPHEN 325 MG PO TABS
650.0000 mg | ORAL_TABLET | Freq: Once | ORAL | Status: AC
  Administered 2023-01-28: 650 mg via ORAL
  Filled 2023-01-28: qty 2

## 2023-01-28 MED ORDER — SODIUM CHLORIDE 0.9% FLUSH
10.0000 mL | Freq: Once | INTRAVENOUS | Status: AC
  Administered 2023-01-28: 10 mL

## 2023-01-28 MED ORDER — DIPHENHYDRAMINE HCL 25 MG PO CAPS
25.0000 mg | ORAL_CAPSULE | Freq: Once | ORAL | Status: AC
  Administered 2023-01-28: 25 mg via ORAL
  Filled 2023-01-28: qty 1

## 2023-01-28 MED ORDER — SODIUM CHLORIDE 0.9 % IV SOLN: Freq: Once | INTRAVENOUS | Status: AC

## 2023-01-28 MED ORDER — SODIUM CHLORIDE 0.9% FLUSH
10.0000 mL | INTRAVENOUS | Status: DC | PRN
  Administered 2023-01-28: 10 mL

## 2023-01-28 MED ORDER — TRASTUZUMAB-ANNS CHEMO 150 MG IV SOLR
6.0000 mg/kg | Freq: Once | INTRAVENOUS | Status: AC
  Administered 2023-01-28: 600 mg via INTRAVENOUS
  Filled 2023-01-28: qty 28.57

## 2023-01-28 MED ORDER — HEPARIN SOD (PORK) LOCK FLUSH 100 UNIT/ML IV SOLN
500.0000 [IU] | Freq: Once | INTRAVENOUS | Status: AC | PRN
  Administered 2023-01-28: 500 [IU]

## 2023-01-28 NOTE — Telephone Encounter (Signed)
Spoke with patient confirming upcoming appointments  

## 2023-01-28 NOTE — Progress Notes (Signed)
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   Alcohol abuse Father        ETOH dependence   Breast cancer Maternal Aunt        dx in her 80s   Lung cancer Maternal Uncle    Breast cancer Paternal Aunt    Cancer Maternal Grandmother        salivary gland cancer   Colon cancer Neg Hx    Colon polyps Neg Hx    Esophageal cancer Neg Hx    Stomach cancer Neg Hx    Rectal cancer Neg Hx     Review of Systems  Constitutional:  Positive  for fatigue. Negative for appetite change, chills, fever and unexpected weight change.  HENT:   Positive for sore throat. Negative for hearing loss, lump/mass and trouble swallowing.   Eyes:  Negative for eye problems and icterus.  Respiratory:  Negative for chest tightness, cough and shortness of breath.   Cardiovascular:  Negative for chest pain, leg swelling and palpitations.  Gastrointestinal:  Negative for abdominal distention, abdominal pain, constipation, diarrhea, nausea and vomiting.  Endocrine: Negative for hot flashes.  Genitourinary:  Negative for difficulty  urinating.   Musculoskeletal:  Positive for back pain (chronic, seeing neurosurgery/pain management). Negative for arthralgias.  Skin:  Negative for itching and rash.  Neurological:  Positive for numbness (chronic, improved after stopping Kadcyla, however also has diabetes). Negative for dizziness, extremity weakness and headaches.  Hematological:  Negative for adenopathy. Does not bruise/bleed easily.  Psychiatric/Behavioral:  Negative for depression. The patient is not nervous/anxious.       PHYSICAL EXAMINATION  ECOG PERFORMANCE STATUS: 1 - Symptomatic but completely ambulatory  Vitals:   01/28/23 0843  BP: 137/63  Pulse: 67  Resp: 16  Temp: 97.7 F (36.5 C)  SpO2: 100%     Physical Exam Constitutional:      General: She is not in acute distress.    Appearance: Normal appearance. She is not toxic-appearing.  HENT:     Head: Normocephalic and atraumatic.  Eyes:     General: No scleral icterus. Cardiovascular:     Rate and Rhythm: Normal rate and regular rhythm.     Pulses: Normal pulses.     Heart sounds: Normal heart sounds.  Pulmonary:     Effort: Pulmonary effort is normal.     Breath sounds: Normal breath sounds.  Abdominal:     General: Abdomen is flat. Bowel sounds are normal. There is no distension.     Palpations: Abdomen is soft.     Tenderness: There is no abdominal tenderness.  Musculoskeletal:        General: No swelling.     Cervical back: Neck supple.  Lymphadenopathy:     Cervical: No cervical adenopathy.  Skin:    General: Skin is warm and dry.     Findings: No rash.  Neurological:     General: No focal deficit present.     Mental Status: She is alert.  Psychiatric:        Mood and Affect: Mood normal.        Behavior: Behavior normal.     LABORATORY DATA:  CBC    Component Value Date/Time   WBC 5.4 01/07/2023 0844   WBC 6.4 09/27/2020 1114   RBC 3.44 (L) 01/07/2023 0844   HGB 10.0 (L) 01/07/2023 0844   HGB 11.9 03/30/2017 1044    HCT 30.9 (L) 01/07/2023 0844   HCT 37.4 03/30/2017 1044   PLT 205 01/07/2023 0844   PLT 238 03/30/2017 1044   MCV 89.8 01/07/2023 0844   MCV 85.0 03/30/2017 1044   MCH 29.1 01/07/2023 0844   MCHC 32.4 01/07/2023 0844   RDW 14.0 01/07/2023 0844   RDW 15.1 (H) 03/30/2017 1044   LYMPHSABS 1.4 01/07/2023 0844   LYMPHSABS 1.3 03/30/2017 1044   MONOABS 0.6 01/07/2023 0844   MONOABS 0.5 03/30/2017 1044   EOSABS 0.1 01/07/2023 0844   EOSABS 0.0 03/30/2017 1044   BASOSABS 0.0 01/07/2023 0844   BASOSABS 0.0 03/30/2017 1044    CMP     Component Value Date/Time   NA 139 01/07/2023 0844  NA 143 03/30/2017 1044   K 3.4 (L) 01/07/2023 0844   K 3.8 03/30/2017 1044   CL 105 01/07/2023 0844   CL 106 01/18/2013 0909   CO2 26 01/07/2023 0844   CO2 27 03/30/2017 1044   GLUCOSE 91 01/07/2023 0844   GLUCOSE 91 03/30/2017 1044   GLUCOSE 99 01/18/2013 0909   BUN 25 (H) 01/07/2023 0844   BUN 9.7 03/30/2017 1044   CREATININE 1.18 (H) 01/07/2023 0844   CREATININE 0.8 03/30/2017 1044   CALCIUM 9.2 01/07/2023 0844   CALCIUM 9.7 03/30/2017 1044   PROT 7.6 01/07/2023 0844   PROT 8.0 03/30/2017 1044   ALBUMIN 3.8 01/07/2023 0844   ALBUMIN 3.8 01/27/2021 1232   ALBUMIN 3.4 (L) 03/30/2017 1044   AST 21 01/07/2023 0844   AST 17 03/30/2017 1044   ALT 16 01/07/2023 0844   ALT 15 03/30/2017 1044   ALKPHOS 44 01/07/2023 0844   ALKPHOS 60 03/30/2017 1044   BILITOT 0.3 01/07/2023 0844   BILITOT 0.34 03/30/2017 1044   GFRNONAA 52 (L) 01/07/2023 0844   GFRAA >60 07/11/2020 0923         ASSESSMENT and THERAPY PLAN:   Malignant neoplasm of central portion of left breast in female, estrogen receptor negative (HCC)  Mareesa is a 67 year old woman with history of metastatic breast cancer currently on treatment with Herceptin only.   [name] is doing well today.  She has no clinical signs of progression of metastatic breast cancer. We reviewed her neuropathy and these appear to be related to her  comorbidities.     Kischa will continue on Herceptin every 3 weeks and we will see her back in 6 weeks for labs follow-up and her next appointment. Recent restaging with stable disease.   For cold and sore throat, this appears to be an upper respiratory tract infection.  No adventitious sounds on lung exam.  She wonders if this can be COVID.  She was recommended to get testing if she is concerned about it.  But even if it is COVID, since it is considered mild, she has to rest, hydrate and take medications for symptomatic management.  She understands to go to the ER if she starts having worsening shortness of breath.  She was encouraged to call us back Monday if her symptoms persist and we can consider giving her antibiotics for secondary bacterial infection.   From breast cancer standpoint, she has been doing really well, no change.  She is going through a lot of stress given her family situation, encouraged her to rest and hydrate and eat well as much as possible. 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  RTC in 6 weeks with [place]. She will continue follow up with cardiology for ECHO and cardiac monitoring.  All questions were answered. The patient knows to call the clinic with any problems, questions or concerns. We can certainly see the patient much sooner if necessary.  Total encounter time:30 minutes*in face-to-face visit time, chart review, lab review, care coordination, order entry, and documentation of the encounter time.  *Total Encounter Time as defined by the Centers for Medicare and Medicaid Services includes, in addition to the face-to-face time of a patient visit (documented in the note above) non-face-to-face time: obtaining and reviewing outside history, ordering and reviewing medications, tests or procedures, care coordination (communications with other health care professionals or caregivers)  and documentation in the medical record.

## 2023-01-28 NOTE — Patient Instructions (Signed)
[facility]  Discharge Instructions: Thank you for choosing [facility] to provide your oncology and hematology care.   If you have a lab appointment with the Cancer Center, please go directly to the Cancer Center and check in at the registration area.   Wear comfortable clothing and clothing appropriate for easy access to any Portacath or PICC line.   We strive to give you quality time with your provider. You may need to reschedule your appointment if you arrive late (15 or more minutes).  Arriving late affects you and other patients whose appointments are after yours.  Also, if you miss three or more appointments without notifying the office, you may be dismissed from the clinic at the provider's discretion.      For prescription refill requests, have your pharmacy contact our office and allow 72 hours for refills to be completed.    Today you received the following chemotherapy and/or immunotherapy agents: Kanjinti.      To help prevent nausea and vomiting after your treatment, we encourage you to take your nausea medication as directed.  BELOW ARE SYMPTOMS THAT SHOULD BE REPORTED IMMEDIATELY: *FEVER GREATER THAN 100.4 F (38 C) OR HIGHER *CHILLS OR SWEATING *NAUSEA AND VOMITING THAT IS NOT CONTROLLED WITH YOUR NAUSEA MEDICATION *UNUSUAL SHORTNESS OF BREATH *UNUSUAL BRUISING OR BLEEDING *URINARY PROBLEMS (pain or burning when urinating, or frequent urination) *BOWEL PROBLEMS (unusual diarrhea, constipation, pain near the anus) TENDERNESS IN MOUTH AND THROAT WITH OR WITHOUT PRESENCE OF ULCERS (sore throat, sores in mouth, or a toothache) UNUSUAL RASH, SWELLING OR PAIN  UNUSUAL VAGINAL DISCHARGE OR ITCHING   Items with * indicate a potential emergency and should be followed up as soon as possible or go to the Emergency Department if any problems should occur.  Please show the CHEMOTHERAPY ALERT CARD or IMMUNOTHERAPY ALERT CARD at  check-in to the Emergency Department and triage nurse.  Should you have questions after your visit or need to cancel or reschedule your appointment, please contact [facility]  Dept: [phone]  and follow the prompts.  Office hours are 8:00 a.m. to 4:30 p.m. Monday - Friday. Please note that voicemails left after 4:00 p.m. may not be returned until the following business day.  We are closed weekends and major holidays. You have access to a nurse at all times for urgent questions. Please call the main number to the clinic Dept: [phone] and follow the prompts.   For any non-urgent questions, you may also contact your provider using MyChart. We now offer e-Visits for anyone 18 and older to request care online for non-urgent symptoms. For details visit [email or web address].   Also download the MyChart app! Go to the app store, search "MyChart", open the app, select [facility], and log in with your MyChart username and password.   

## 2023-02-02 ENCOUNTER — Encounter: Payer: Self-pay | Admitting: Internal Medicine

## 2023-02-02 ENCOUNTER — Ambulatory Visit (INDEPENDENT_AMBULATORY_CARE_PROVIDER_SITE_OTHER): Payer: Medicare Other | Admitting: Internal Medicine

## 2023-02-02 VITALS — BP 124/80 | HR 63 | Temp 98.3°F | Ht 59.0 in | Wt 229.0 lb

## 2023-02-02 DIAGNOSIS — U071 COVID-19: Secondary | ICD-10-CM | POA: Diagnosis not present

## 2023-02-02 DIAGNOSIS — E1165 Type 2 diabetes mellitus with hyperglycemia: Secondary | ICD-10-CM | POA: Diagnosis not present

## 2023-02-02 DIAGNOSIS — I10 Essential (primary) hypertension: Secondary | ICD-10-CM | POA: Diagnosis not present

## 2023-02-02 LAB — POC COVID19 BINAXNOW: SARS Coronavirus 2 Ag: POSITIVE — AB

## 2023-02-02 LAB — POCT INFLUENZA A/B
Influenza A, POC: NEGATIVE
Influenza B, POC: NEGATIVE

## 2023-02-02 LAB — POCT RESPIRATORY SYNCYTIAL VIRUS: RSV Rapid Ag: NEGATIVE

## 2023-02-02 MED ORDER — PROMETHAZINE-DM 6.25-15 MG/5ML PO SYRP: 5.0000 mL | ORAL_SOLUTION | Freq: Four times a day (QID) | ORAL | 0 refills | Status: DC | PRN

## 2023-02-02 MED ORDER — NIRMATRELVIR/RITONAVIR (PAXLOVID) TABLET (RENAL DOSING): 2.0000 | ORAL_TABLET | Freq: Two times a day (BID) | ORAL | 0 refills | Status: AC

## 2023-02-02 NOTE — Patient Instructions (Signed)
Please take all new medication as prescribed - the paxlovid antibiotic, and cough medicine as needed  Please continue all other medications as before, and refills have been done if requested.  Please have the pharmacy call with any other refills you may need.  Please continue your efforts at being more active, low cholesterol diet, and weight control  Please keep your appointments with your specialists as you may have planned
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
## 2023-02-04 ENCOUNTER — Ambulatory Visit (HOSPITAL_BASED_OUTPATIENT_CLINIC_OR_DEPARTMENT_OTHER): Payer: Medicare Other | Admitting: Internal Medicine

## 2023-02-04 NOTE — Assessment & Plan Note (Signed)
Lab Results  Component Value Date   HGBA1C 7.1 (H) 07/10/2022   uncontrolled, pt to continue current medical treatment  mounjaro 10 mg weekly, and better DM diet, declines increase for now

## 2023-02-04 NOTE — Assessment & Plan Note (Signed)
BP Readings from Last 3 Encounters:  02/02/23 124/80  01/28/23 135/66  01/28/23 137/63   Stable, pt to continue medical treatment coreg 6.25 bid, micardis hct 80-12.5 qd

## 2023-02-04 NOTE — Assessment & Plan Note (Signed)
Mild to mod, for paxlovid course, cough emd prn,,  to f/u any worsening symptoms or concerns

## 2023-02-08 ENCOUNTER — Telehealth: Payer: Self-pay | Admitting: Pharmacist

## 2023-02-08 MED ORDER — MOUNJARO 2.5 MG/0.5ML ~~LOC~~ SOAJ: 2.5000 mg | SUBCUTANEOUS | 0 refills | Status: DC

## 2023-02-08 NOTE — Telephone Encounter (Signed)
Patient called and LVM for Megan to call her back 

## 2023-02-08 NOTE — Telephone Encounter (Signed)
Called pt and left message to f/u with Mounjaro tolerability and dose increase.

## 2023-02-08 NOTE — Telephone Encounter (Signed)
Called pt back, states she couldn't get her Mounjaro filled until 4/12 (we spoke on 3/25 and I advised pt to let me know if she couldn't get rx filled, did not hear back). She was then diagnosed with COVID on 4/12 and thought that Paxlovid might interact with her Mounjaro. Has also been having diarrhea since being on Paxlovid, still taking this as well. Last dose around 3/22. Advised pt since she's been off Mounjaro a month that her dose will need to be titrated back up again. Insurance also will not cover next fill until mounjaro refill is due which is probably in another 2 weeks or so based on when they filled the  dose. She will keep the  dose for when she titrates back up to that in the future. I will call pt in 6 weeks, she should be finishing up her 2.5mg  dose again at that time.

## 2023-02-10 ENCOUNTER — Telehealth: Payer: Self-pay | Admitting: Hematology and Oncology

## 2023-02-10 NOTE — Telephone Encounter (Signed)
Spoke with patient confirming upcoming appointment change  

## 2023-02-15 ENCOUNTER — Ambulatory Visit (INDEPENDENT_AMBULATORY_CARE_PROVIDER_SITE_OTHER): Payer: Medicare Other

## 2023-02-15 ENCOUNTER — Encounter: Payer: Self-pay | Admitting: Internal Medicine

## 2023-02-15 ENCOUNTER — Ambulatory Visit (INDEPENDENT_AMBULATORY_CARE_PROVIDER_SITE_OTHER): Payer: Medicare Other | Admitting: Internal Medicine

## 2023-02-15 VITALS — BP 128/80 | HR 73 | Temp 98.6°F | Ht 59.0 in | Wt 231.0 lb

## 2023-02-15 DIAGNOSIS — R051 Acute cough: Secondary | ICD-10-CM | POA: Diagnosis not present

## 2023-02-15 DIAGNOSIS — Z789 Other specified health status: Secondary | ICD-10-CM

## 2023-02-15 DIAGNOSIS — E7800 Pure hypercholesterolemia, unspecified: Secondary | ICD-10-CM

## 2023-02-15 DIAGNOSIS — Z7985 Long-term (current) use of injectable non-insulin antidiabetic drugs: Secondary | ICD-10-CM | POA: Diagnosis not present

## 2023-02-15 DIAGNOSIS — I10 Essential (primary) hypertension: Secondary | ICD-10-CM | POA: Diagnosis not present

## 2023-02-15 DIAGNOSIS — E1142 Type 2 diabetes mellitus with diabetic polyneuropathy: Secondary | ICD-10-CM

## 2023-02-15 LAB — POC COVID19 BINAXNOW: SARS Coronavirus 2 Ag: NEGATIVE

## 2023-02-15 MED ORDER — TELMISARTAN-HCTZ 80-12.5 MG PO TABS: 1.0000 | ORAL_TABLET | Freq: Every day | ORAL | 3 refills | Status: DC
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
## 2023-02-15 NOTE — Assessment & Plan Note (Signed)
Mild to mod persistent post covid, scant prod without sob, fever-   pt reqeusts cxr, continue cough med prn

## 2023-02-15 NOTE — Assessment & Plan Note (Signed)
Lab Results  Component Value Date   HGBA1C 7.1 (H) 07/10/2022   Uncontrolled, for f/u A1c now on mounjaro 25 mg weekly, pt to continue current medical treatment  and f/u lab

## 2023-02-15 NOTE — Assessment & Plan Note (Signed)
Lab Results  Component Value Date   LDLCALC 77 07/10/2022   Uncontrolled, pt not wantint to take pravachol further,, pt to continue current dm diet and f/u lab next visit per pt request

## 2023-02-15 NOTE — Assessment & Plan Note (Signed)
Pt declines further statin

## 2023-02-15 NOTE — Patient Instructions (Addendum)
MyChart Username [id number]  You made your new password today for mychart  Please continue all other medications as before, and refills have been done if requested.  Please have the pharmacy call with any other refills you may need.  Please continue your efforts at being more active, low cholesterol diet, and weight control.  You are otherwise up to date with prevention measures today.  Please keep your appointments with your specialists as you may have planned  Please go to the XRAY Department in the first floor for the x-ray testing  Please go to the LAB at the blood drawing area for the tests to be done - just the A1c today  Please make an Appointment to return in 6 months, or sooner if needed

## 2023-02-15 NOTE — Assessment & Plan Note (Signed)
BP Readings from Last 3 Encounters:  02/15/23 128/80  02/02/23 124/80  01/28/23 135/66   Stable, pt to continue medical treatment micardis hct 80 12.5 qd

## 2023-02-16 ENCOUNTER — Telehealth: Payer: Self-pay | Admitting: *Deleted

## 2023-02-16 ENCOUNTER — Inpatient Hospital Stay: Payer: Medicare Other

## 2023-02-16 ENCOUNTER — Other Ambulatory Visit: Payer: Self-pay

## 2023-02-16 ENCOUNTER — Other Ambulatory Visit: Payer: Self-pay | Admitting: *Deleted

## 2023-02-16 VITALS — BP 143/72 | HR 65 | Temp 98.2°F | Ht 59.0 in | Wt 234.0 lb

## 2023-02-16 DIAGNOSIS — Z95828 Presence of other vascular implants and grafts: Secondary | ICD-10-CM

## 2023-02-16 DIAGNOSIS — C50112 Malignant neoplasm of central portion of left female breast: Secondary | ICD-10-CM

## 2023-02-16 DIAGNOSIS — Z171 Estrogen receptor negative status [ER-]: Secondary | ICD-10-CM

## 2023-02-16 DIAGNOSIS — Z5112 Encounter for antineoplastic immunotherapy: Secondary | ICD-10-CM | POA: Diagnosis not present

## 2023-02-16 LAB — CBC WITH DIFFERENTIAL (CANCER CENTER ONLY)
Abs Immature Granulocytes: 0.02 10*3/uL (ref 0.00–0.07)
Basophils Absolute: 0 10*3/uL (ref 0.0–0.1)
Basophils Relative: 0 %
Eosinophils Absolute: 0.1 10*3/uL (ref 0.0–0.5)
Eosinophils Relative: 2 %
HCT: 28.3 % — ABNORMAL LOW (ref 36.0–46.0)
Hemoglobin: 9.2 g/dL — ABNORMAL LOW (ref 12.0–15.0)
Immature Granulocytes: 0 %
Lymphocytes Relative: 25 %
Lymphs Abs: 1.3 10*3/uL (ref 0.7–4.0)
MCH: 29.4 pg (ref 26.0–34.0)
MCHC: 32.5 g/dL (ref 30.0–36.0)
MCV: 90.4 fL (ref 80.0–100.0)
Monocytes Absolute: 0.7 10*3/uL (ref 0.1–1.0)
Monocytes Relative: 14 %
Neutro Abs: 3.1 10*3/uL (ref 1.7–7.7)
Neutrophils Relative %: 59 %
Platelet Count: 181 10*3/uL (ref 150–400)
RBC: 3.13 MIL/uL — ABNORMAL LOW (ref 3.87–5.11)
RDW: 14.8 % (ref 11.5–15.5)
WBC Count: 5.7 10*3/uL (ref 4.0–10.5)
nRBC: 0 % (ref 0.0–0.2)

## 2023-02-16 LAB — CMP (CANCER CENTER ONLY)
ALT: 13 U/L (ref 0–44)
AST: 17 U/L (ref 15–41)
Albumin: 3.7 g/dL (ref 3.5–5.0)
Alkaline Phosphatase: 42 U/L (ref 38–126)
Anion gap: 6 (ref 5–15)
BUN: 32 mg/dL — ABNORMAL HIGH (ref 8–23)
CO2: 26 mmol/L (ref 22–32)
Calcium: 9.5 mg/dL (ref 8.9–10.3)
Chloride: 108 mmol/L (ref 98–111)
Creatinine: 1 mg/dL (ref 0.44–1.00)
GFR, Estimated: >60 mL/min (ref >60)
Glucose, Bld: 123 mg/dL — ABNORMAL HIGH (ref 70–99)
Potassium: 3.7 mmol/L (ref 3.5–5.1)
Sodium: 140 mmol/L (ref 135–145)
Total Bilirubin: 0.3 mg/dL (ref 0.3–1.2)
Total Protein: 7.5 g/dL (ref 6.5–8.1)

## 2023-02-16 LAB — HEMOGLOBIN A1C: Hgb A1c MFr Bld: 6.1 % (ref 4.6–6.5)

## 2023-02-16 MED ORDER — EPOETIN ALFA-EPBX 40000 UNIT/ML IJ SOLN
40000.0000 [IU] | Freq: Once | INTRAMUSCULAR | Status: AC
  Administered 2023-02-16: 40000 [IU] via SUBCUTANEOUS
  Filled 2023-02-16: qty 1

## 2023-02-16 MED ORDER — HEPARIN SOD (PORK) LOCK FLUSH 100 UNIT/ML IV SOLN
500.0000 [IU] | Freq: Once | INTRAVENOUS | Status: AC | PRN
  Administered 2023-02-16: 500 [IU]

## 2023-02-16 MED ORDER — DIPHENHYDRAMINE HCL 25 MG PO CAPS
25.0000 mg | ORAL_CAPSULE | Freq: Once | ORAL | Status: AC
  Administered 2023-02-16: 25 mg via ORAL
  Filled 2023-02-16: qty 1

## 2023-02-16 MED ORDER — SODIUM CHLORIDE 0.9 % IV SOLN: Freq: Once | INTRAVENOUS | Status: AC

## 2023-02-16 MED ORDER — SODIUM CHLORIDE 0.9% FLUSH
10.0000 mL | Freq: Once | INTRAVENOUS | Status: AC
  Administered 2023-02-16: 10 mL

## 2023-02-16 MED ORDER — SODIUM CHLORIDE 0.9% FLUSH
10.0000 mL | INTRAVENOUS | Status: DC | PRN
  Administered 2023-02-16: 10 mL

## 2023-02-16 MED ORDER — TRASTUZUMAB-ANNS CHEMO 150 MG IV SOLR
6.0000 mg/kg | Freq: Once | INTRAVENOUS | Status: AC
  Administered 2023-02-16: 600 mg via INTRAVENOUS
  Filled 2023-02-16: qty 28.57

## 2023-02-16 MED ORDER — ACETAMINOPHEN 325 MG PO TABS
650.0000 mg | ORAL_TABLET | Freq: Once | ORAL | Status: AC
  Administered 2023-02-16: 650 mg via ORAL
  Filled 2023-02-16: qty 2

## 2023-02-16 NOTE — Telephone Encounter (Signed)
Pt is scheduled later this month for her every 6 month echo for treatment - may proceed with treatment today.

## 2023-02-18 ENCOUNTER — Inpatient Hospital Stay: Payer: Medicare Other

## 2023-03-12 ENCOUNTER — Inpatient Hospital Stay: Payer: Medicare Other

## 2023-03-12 ENCOUNTER — Other Ambulatory Visit: Payer: Self-pay

## 2023-03-12 ENCOUNTER — Other Ambulatory Visit: Payer: Self-pay | Admitting: Hematology and Oncology

## 2023-03-12 ENCOUNTER — Inpatient Hospital Stay: Payer: Medicare Other | Attending: Hematology and Oncology | Admitting: Hematology and Oncology

## 2023-03-12 VITALS — BP 147/60 | HR 73 | Temp 97.5°F | Resp 18 | Ht 59.0 in | Wt 235.5 lb

## 2023-03-12 DIAGNOSIS — G629 Polyneuropathy, unspecified: Secondary | ICD-10-CM

## 2023-03-12 DIAGNOSIS — C50812 Malignant neoplasm of overlapping sites of left female breast: Secondary | ICD-10-CM | POA: Diagnosis present

## 2023-03-12 DIAGNOSIS — Z923 Personal history of irradiation: Secondary | ICD-10-CM | POA: Insufficient documentation

## 2023-03-12 DIAGNOSIS — Z7901 Long term (current) use of anticoagulants: Secondary | ICD-10-CM | POA: Diagnosis not present

## 2023-03-12 DIAGNOSIS — C773 Secondary and unspecified malignant neoplasm of axilla and upper limb lymph nodes: Secondary | ICD-10-CM | POA: Diagnosis present

## 2023-03-12 DIAGNOSIS — Z90722 Acquired absence of ovaries, bilateral: Secondary | ICD-10-CM | POA: Diagnosis not present

## 2023-03-12 DIAGNOSIS — Z9221 Personal history of antineoplastic chemotherapy: Secondary | ICD-10-CM | POA: Diagnosis not present

## 2023-03-12 DIAGNOSIS — Z90710 Acquired absence of both cervix and uterus: Secondary | ICD-10-CM | POA: Insufficient documentation

## 2023-03-12 DIAGNOSIS — Z5112 Encounter for antineoplastic immunotherapy: Secondary | ICD-10-CM | POA: Diagnosis present

## 2023-03-12 DIAGNOSIS — Z171 Estrogen receptor negative status [ER-]: Secondary | ICD-10-CM

## 2023-03-12 DIAGNOSIS — Z9079 Acquired absence of other genital organ(s): Secondary | ICD-10-CM | POA: Diagnosis not present

## 2023-03-12 DIAGNOSIS — Z86718 Personal history of other venous thrombosis and embolism: Secondary | ICD-10-CM | POA: Insufficient documentation

## 2023-03-12 DIAGNOSIS — Z8542 Personal history of malignant neoplasm of other parts of uterus: Secondary | ICD-10-CM | POA: Diagnosis not present

## 2023-03-12 DIAGNOSIS — Z95828 Presence of other vascular implants and grafts: Secondary | ICD-10-CM

## 2023-03-12 DIAGNOSIS — C50112 Malignant neoplasm of central portion of left female breast: Secondary | ICD-10-CM

## 2023-03-12 LAB — CBC WITH DIFFERENTIAL (CANCER CENTER ONLY)
Abs Immature Granulocytes: 0.01 10*3/uL (ref 0.00–0.07)
Basophils Absolute: 0 10*3/uL (ref 0.0–0.1)
Basophils Relative: 1 %
Eosinophils Absolute: 0.1 10*3/uL (ref 0.0–0.5)
Eosinophils Relative: 1 %
HCT: 32.3 % — ABNORMAL LOW (ref 36.0–46.0)
Hemoglobin: 10 g/dL — ABNORMAL LOW (ref 12.0–15.0)
Immature Granulocytes: 0 %
Lymphocytes Relative: 28 %
Lymphs Abs: 1.6 10*3/uL (ref 0.7–4.0)
MCH: 28.2 pg (ref 26.0–34.0)
MCHC: 31 g/dL (ref 30.0–36.0)
MCV: 91.2 fL (ref 80.0–100.0)
Monocytes Absolute: 0.7 10*3/uL (ref 0.1–1.0)
Monocytes Relative: 12 %
Neutro Abs: 3.2 10*3/uL (ref 1.7–7.7)
Neutrophils Relative %: 58 %
Platelet Count: 243 10*3/uL (ref 150–400)
RBC: 3.54 MIL/uL — ABNORMAL LOW (ref 3.87–5.11)
RDW: 15.6 % — ABNORMAL HIGH (ref 11.5–15.5)
WBC Count: 5.6 10*3/uL (ref 4.0–10.5)
nRBC: 0 % (ref 0.0–0.2)

## 2023-03-12 LAB — CMP (CANCER CENTER ONLY)
ALT: 14 U/L (ref 0–44)
AST: 18 U/L (ref 15–41)
Albumin: 3.8 g/dL (ref 3.5–5.0)
Alkaline Phosphatase: 44 U/L (ref 38–126)
Anion gap: 6 (ref 5–15)
BUN: 20 mg/dL (ref 8–23)
CO2: 29 mmol/L (ref 22–32)
Calcium: 9.3 mg/dL (ref 8.9–10.3)
Chloride: 105 mmol/L (ref 98–111)
Creatinine: 0.89 mg/dL (ref 0.44–1.00)
GFR, Estimated: >60 mL/min (ref >60)
Glucose, Bld: 91 mg/dL (ref 70–99)
Potassium: 4 mmol/L (ref 3.5–5.1)
Sodium: 140 mmol/L (ref 135–145)
Total Bilirubin: 0.3 mg/dL (ref 0.3–1.2)
Total Protein: 7.8 g/dL (ref 6.5–8.1)

## 2023-03-12 MED ORDER — TRASTUZUMAB-ANNS CHEMO 150 MG IV SOLR
6.0000 mg/kg | Freq: Once | INTRAVENOUS | Status: AC
  Administered 2023-03-12: 600 mg via INTRAVENOUS
  Filled 2023-03-12: qty 28.57

## 2023-03-12 MED ORDER — SODIUM CHLORIDE 0.9 % IV SOLN: Freq: Once | INTRAVENOUS | Status: AC

## 2023-03-12 MED ORDER — SODIUM CHLORIDE 0.9% FLUSH
10.0000 mL | INTRAVENOUS | Status: DC | PRN
  Administered 2023-03-12: 10 mL

## 2023-03-12 MED ORDER — DIPHENHYDRAMINE HCL 25 MG PO CAPS
25.0000 mg | ORAL_CAPSULE | Freq: Once | ORAL | Status: AC
  Administered 2023-03-12: 25 mg via ORAL
  Filled 2023-03-12: qty 1

## 2023-03-12 MED ORDER — SODIUM CHLORIDE 0.9% FLUSH
10.0000 mL | Freq: Once | INTRAVENOUS | Status: AC
  Administered 2023-03-12: 10 mL

## 2023-03-12 MED ORDER — HEPARIN SOD (PORK) LOCK FLUSH 100 UNIT/ML IV SOLN
500.0000 [IU] | Freq: Once | INTRAVENOUS | Status: AC | PRN
  Administered 2023-03-12: 500 [IU]

## 2023-03-12 MED ORDER — ACETAMINOPHEN 325 MG PO TABS
650.0000 mg | ORAL_TABLET | Freq: Once | ORAL | Status: AC
  Administered 2023-03-12: 650 mg via ORAL
  Filled 2023-03-12: qty 2

## 2023-03-12 NOTE — Patient Instructions (Signed)
[facility]  Discharge Instructions: Thank you for choosing [facility] to provide your oncology and hematology care.   If you have a lab appointment with the Cancer Center, please go directly to the Cancer Center and check in at the registration area.   Wear comfortable clothing and clothing appropriate for easy access to any Portacath or PICC line.   We strive to give you quality time with your provider. You may need to reschedule your appointment if you arrive late (15 or more minutes).  Arriving late affects you and other patients whose appointments are after yours.  Also, if you miss three or more appointments without notifying the office, you may be dismissed from the clinic at the provider's discretion.      For prescription refill requests, have your pharmacy contact our office and allow 72 hours for refills to be completed.    Today you received the following chemotherapy and/or immunotherapy agents: Kanjinti.      To help prevent nausea and vomiting after your treatment, we encourage you to take your nausea medication as directed.  BELOW ARE SYMPTOMS THAT SHOULD BE REPORTED IMMEDIATELY: *FEVER GREATER THAN 100.4 F (38 C) OR HIGHER *CHILLS OR SWEATING *NAUSEA AND VOMITING THAT IS NOT CONTROLLED WITH YOUR NAUSEA MEDICATION *UNUSUAL SHORTNESS OF BREATH *UNUSUAL BRUISING OR BLEEDING *URINARY PROBLEMS (pain or burning when urinating, or frequent urination) *BOWEL PROBLEMS (unusual diarrhea, constipation, pain near the anus) TENDERNESS IN MOUTH AND THROAT WITH OR WITHOUT PRESENCE OF ULCERS (sore throat, sores in mouth, or a toothache) UNUSUAL RASH, SWELLING OR PAIN  UNUSUAL VAGINAL DISCHARGE OR ITCHING   Items with * indicate a potential emergency and should be followed up as soon as possible or go to the Emergency Department if any problems should occur.  Please show the CHEMOTHERAPY ALERT CARD or IMMUNOTHERAPY ALERT CARD at  check-in to the Emergency Department and triage nurse.  Should you have questions after your visit or need to cancel or reschedule your appointment, please contact [facility]  Dept: [phone]  and follow the prompts.  Office hours are 8:00 a.m. to 4:30 p.m. Monday - Friday. Please note that voicemails left after 4:00 p.m. may not be returned until the following business day.  We are closed weekends and major holidays. You have access to a nurse at all times for urgent questions. Please call the main number to the clinic Dept: [phone] and follow the prompts.   For any non-urgent questions, you may also contact your provider using MyChart. We now offer e-Visits for anyone 18 and older to request care online for non-urgent symptoms. For details visit [email or web address].   Also download the MyChart app! Go to the app store, search "MyChart", open the app, select [place], and log in with your MyChart username and password.   

## 2023-03-12 NOTE — Progress Notes (Signed)
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 Connections: Not on file  Intimate Partner Violence: Not on file    FAMILY HISTORY: Family History  Problem Relation Age of Onset   Diabetes Mother    Hypertension Mother    Ovarian cancer Mother    Heart disease Father        COPD   Alcohol abuse Father        ETOH dependence   Breast cancer Maternal Aunt        dx in her 50s   Lung cancer Maternal Uncle    Breast cancer Paternal Aunt    Cancer Maternal Grandmother  salivary gland cancer   Colon cancer Neg Hx    Colon polyps Neg Hx    Esophageal cancer Neg Hx    Stomach cancer Neg Hx    Rectal cancer Neg Hx     Review of Systems  Constitutional:  Positive for fatigue. Negative for appetite change, chills, fever and unexpected weight change.  HENT:   Negative for hearing loss, lump/mass, sore throat and trouble swallowing.   Eyes:  Negative for eye problems and icterus.  Respiratory:  Negative for chest  tightness, cough and shortness of breath.   Cardiovascular:  Negative for chest pain, leg swelling and palpitations.  Gastrointestinal:  Negative for abdominal distention, abdominal pain, constipation, diarrhea, nausea and vomiting.  Endocrine: Negative for hot flashes.  Genitourinary:  Negative for difficulty urinating.   Musculoskeletal:  Positive for back pain (chronic, seeing neurosurgery/pain management). Negative for arthralgias.  Skin:  Negative for itching and rash.  Neurological:  Positive for numbness (chronic, improved after stopping Kadcyla, however also has diabetes). Negative for dizziness, extremity weakness and headaches.  Hematological:  Negative for adenopathy. Does not bruise/bleed easily.  Psychiatric/Behavioral:  Negative for depression. The patient is not nervous/anxious.       PHYSICAL EXAMINATION  ECOG PERFORMANCE STATUS: 1 - Symptomatic but completely ambulatory  Vitals:   03/12/23 1208  BP: (!) 147/60  Pulse: 73  Resp: 18  Temp: (!) 97.5 F (36.4 C)  SpO2: 100%     Physical Exam Constitutional:      General: She is not in acute distress.    Appearance: Normal appearance. She is not toxic-appearing.  HENT:     Head: Normocephalic and atraumatic.  Eyes:     General: No scleral icterus. Cardiovascular:     Rate and Rhythm: Normal rate and regular rhythm.  Pulmonary:     Effort: Pulmonary effort is normal.     Breath sounds: Normal breath sounds.  Chest:     Comments: She wanted me to do a breast exam of her left breast since it was tender.  No palpable masses.  Surgical scarring noted.  No regional adenopathy Abdominal:     General: Abdomen is flat. Bowel sounds are normal. There is no distension.     Palpations: Abdomen is soft.     Tenderness: There is no abdominal tenderness.  Musculoskeletal:        General: No swelling.     Cervical back: Neck supple.  Lymphadenopathy:     Cervical: No cervical adenopathy.  Skin:    General: Skin is  warm and dry.     Findings: No rash.  Neurological:     General: No focal deficit present.     Mental Status: She is alert.  Psychiatric:        Mood and Affect: Mood normal.        Behavior: Behavior normal.     LABORATORY DATA:  CBC    Component Value Date/Time   WBC 5.6 03/12/2023 1130   WBC 6.4 09/27/2020 1114   RBC 3.54 (L) 03/12/2023 1130   HGB 10.0 (L) 03/12/2023 1130   HGB 11.9 03/30/2017 1044   HCT 32.3 (L) 03/12/2023 1130   HCT 37.4 03/30/2017 1044   PLT 243 03/12/2023 1130   PLT 238 03/30/2017 1044   MCV 91.2 03/12/2023 1130   MCV 85.0 03/30/2017 1044   MCH 28.2 03/12/2023 1130   MCHC 31.0 03/12/2023 1130   RDW 15.6 (H) 03/12/2023 1130   RDW 15.1 (H) 03/30/2017 1044  LYMPHSABS 1.6 03/12/2023 1130   LYMPHSABS 1.3 03/30/2017 1044   MONOABS 0.7 03/12/2023 1130   MONOABS 0.5 03/30/2017 1044   EOSABS 0.1 03/12/2023 1130   EOSABS 0.0 03/30/2017 1044   BASOSABS 0.0 03/12/2023 1130   BASOSABS 0.0 03/30/2017 1044    CMP     Component Value Date/Time   NA 140 02/16/2023 0840   NA 143 03/30/2017 1044   K 3.7 02/16/2023 0840   K 3.8 03/30/2017 1044   CL 108 02/16/2023 0840   CL 106 01/18/2013 0909   CO2 26 02/16/2023 0840   CO2 27 03/30/2017 1044   GLUCOSE 123 (H) 02/16/2023 0840   GLUCOSE 91 03/30/2017 1044   GLUCOSE 99 01/18/2013 0909   BUN 32 (H) 02/16/2023 0840   BUN 9.7 03/30/2017 1044   CREATININE 1.00 02/16/2023 0840   CREATININE 0.8 03/30/2017 1044   CALCIUM 9.5 02/16/2023 0840   CALCIUM 9.7 03/30/2017 1044   PROT 7.5 02/16/2023 0840   PROT 8.0 03/30/2017 1044   ALBUMIN 3.7 02/16/2023 0840   ALBUMIN 3.8 01/27/2021 1232   ALBUMIN 3.4 (L) 03/30/2017 1044   AST 17 02/16/2023 0840   AST 17 03/30/2017 1044   ALT 13 02/16/2023 0840   ALT 15 03/30/2017 1044   ALKPHOS 42 02/16/2023 0840   ALKPHOS 60 03/30/2017 1044   BILITOT 0.3 02/16/2023 0840   BILITOT 0.34 03/30/2017 1044   GFRNONAA >60 02/16/2023 0840   GFRAA >60 07/11/2020 0923          ASSESSMENT and THERAPY PLAN:   Malignant neoplasm of central portion of left breast in female, estrogen receptor negative (HCC)  [name] is a 67 year old woman with history of metastatic breast cancer currently on treatment with Herceptin only.   [name] is doing well today.  She has no clinical signs of progression of metastatic breast cancer. We reviewed her neuropathy and these appear to be related to her comorbidities including her alcohol consumption. Advised to abstain from alcohol cessation   [name] will continue on Herceptin every 3 weeks and we will see her back in 6 weeks for labs follow-up and her next appointment. Recent restaging with stable disease.Repeat in Aug 2024.   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  RTC in 6 weeks with [place]. She will continue follow up with cardiology for ECHO and cardiac monitoring.  All questions were answered. The patient knows to call the clinic with any problems, questions or concerns. We can certainly see the patient much sooner if necessary.  Total encounter time:30 minutes*in face-to-face visit time, chart review, lab review, care coordination, order entry, and documentation of the encounter time.  *Total Encounter Time as defined by the Centers for Medicare and Medicaid Services includes, in addition to the face-to-face time of a patient visit (documented in the note above) non-face-to-face time: obtaining and reviewing outside history, ordering and reviewing medications, tests or procedures, care coordination (communications with other health care professionals or caregivers) and documentation in the medical record.

## 2023-03-12 NOTE — Progress Notes (Signed)
Treatment plan orders added.

## 2023-03-14 ENCOUNTER — Other Ambulatory Visit (HOSPITAL_COMMUNITY): Payer: Self-pay | Admitting: Cardiology

## 2023-03-16 ENCOUNTER — Encounter (HOSPITAL_COMMUNITY): Payer: Self-pay | Admitting: Cardiology

## 2023-03-16 ENCOUNTER — Ambulatory Visit (HOSPITAL_BASED_OUTPATIENT_CLINIC_OR_DEPARTMENT_OTHER)
Admission: RE | Admit: 2023-03-16 | Discharge: 2023-03-16 | Disposition: A | Discharge status: Home / Self Care | Payer: Medicare Other | Source: Ambulatory Visit | Attending: Cardiology | Admitting: Cardiology

## 2023-03-16 ENCOUNTER — Ambulatory Visit (HOSPITAL_COMMUNITY)
Admission: RE | Admit: 2023-03-16 | Discharge: 2023-03-16 | Disposition: A | Discharge status: Home / Self Care | Payer: Medicare Other | Source: Ambulatory Visit | Attending: Cardiology | Admitting: Cardiology

## 2023-03-16 VITALS — BP 108/60 | HR 68 | Wt 231.6 lb

## 2023-03-16 DIAGNOSIS — E669 Obesity, unspecified: Secondary | ICD-10-CM | POA: Diagnosis not present

## 2023-03-16 DIAGNOSIS — Z8542 Personal history of malignant neoplasm of other parts of uterus: Secondary | ICD-10-CM | POA: Diagnosis not present

## 2023-03-16 DIAGNOSIS — Z90710 Acquired absence of both cervix and uterus: Secondary | ICD-10-CM | POA: Diagnosis not present

## 2023-03-16 DIAGNOSIS — G4733 Obstructive sleep apnea (adult) (pediatric): Secondary | ICD-10-CM | POA: Diagnosis not present

## 2023-03-16 DIAGNOSIS — Z7901 Long term (current) use of anticoagulants: Secondary | ICD-10-CM | POA: Insufficient documentation

## 2023-03-16 DIAGNOSIS — Z79899 Other long term (current) drug therapy: Secondary | ICD-10-CM | POA: Diagnosis not present

## 2023-03-16 DIAGNOSIS — I427 Cardiomyopathy due to drug and external agent: Secondary | ICD-10-CM | POA: Insufficient documentation

## 2023-03-16 DIAGNOSIS — R2689 Other abnormalities of gait and mobility: Secondary | ICD-10-CM | POA: Diagnosis not present

## 2023-03-16 DIAGNOSIS — G5600 Carpal tunnel syndrome, unspecified upper limb: Secondary | ICD-10-CM | POA: Diagnosis present

## 2023-03-16 DIAGNOSIS — I5032 Chronic diastolic (congestive) heart failure: Secondary | ICD-10-CM

## 2023-03-16 DIAGNOSIS — I081 Rheumatic disorders of both mitral and tricuspid valves: Secondary | ICD-10-CM | POA: Insufficient documentation

## 2023-03-16 DIAGNOSIS — C50919 Malignant neoplasm of unspecified site of unspecified female breast: Secondary | ICD-10-CM | POA: Diagnosis not present

## 2023-03-16 DIAGNOSIS — Z801 Family history of malignant neoplasm of trachea, bronchus and lung: Secondary | ICD-10-CM | POA: Diagnosis not present

## 2023-03-16 DIAGNOSIS — E1142 Type 2 diabetes mellitus with diabetic polyneuropathy: Secondary | ICD-10-CM | POA: Diagnosis present

## 2023-03-16 DIAGNOSIS — Z5982 Transportation insecurity: Secondary | ICD-10-CM | POA: Diagnosis not present

## 2023-03-16 DIAGNOSIS — Z90722 Acquired absence of ovaries, bilateral: Secondary | ICD-10-CM | POA: Insufficient documentation

## 2023-03-16 DIAGNOSIS — Z6841 Body Mass Index (BMI) 40.0 and over, adult: Secondary | ICD-10-CM | POA: Diagnosis not present

## 2023-03-16 DIAGNOSIS — I110 Hypertensive heart disease with heart failure: Secondary | ICD-10-CM | POA: Insufficient documentation

## 2023-03-16 DIAGNOSIS — Z86718 Personal history of other venous thrombosis and embolism: Secondary | ICD-10-CM | POA: Insufficient documentation

## 2023-03-16 DIAGNOSIS — Z853 Personal history of malignant neoplasm of breast: Secondary | ICD-10-CM | POA: Insufficient documentation

## 2023-03-16 DIAGNOSIS — E785 Hyperlipidemia, unspecified: Secondary | ICD-10-CM | POA: Diagnosis not present

## 2023-03-16 DIAGNOSIS — Z800 Family history of malignant neoplasm of digestive organs: Secondary | ICD-10-CM | POA: Diagnosis not present

## 2023-03-16 DIAGNOSIS — I509 Heart failure, unspecified: Secondary | ICD-10-CM | POA: Diagnosis not present

## 2023-03-16 DIAGNOSIS — T451X5A Adverse effect of antineoplastic and immunosuppressive drugs, initial encounter: Secondary | ICD-10-CM | POA: Insufficient documentation

## 2023-03-16 LAB — BASIC METABOLIC PANEL
Anion gap: 7 (ref 5–15)
BUN: 23 mg/dL (ref 8–23)
CO2: 26 mmol/L (ref 22–32)
Calcium: 9.6 mg/dL (ref 8.9–10.3)
Chloride: 103 mmol/L (ref 98–111)
Creatinine, Ser: 1.09 mg/dL — ABNORMAL HIGH (ref 0.44–1.00)
GFR, Estimated: 57 mL/min — ABNORMAL LOW (ref >60)
Glucose, Bld: 91 mg/dL (ref 70–99)
Potassium: 3.9 mmol/L (ref 3.5–5.1)
Sodium: 136 mmol/L (ref 135–145)

## 2023-03-16 LAB — ECHOCARDIOGRAM COMPLETE
AR max vel: 2.03 cm2
AV Area VTI: 2.14 cm2
AV Area mean vel: 2.02 cm2
AV Mean grad: 5 mmHg
AV Peak grad: 9.5 mmHg
Ao pk vel: 1.54 m/s
Area-P 1/2: 3.68 cm2
Calc EF: 60 %
Est EF: 55
S' Lateral: 3 cm
Single Plane A2C EF: 60 %
Single Plane A4C EF: 59.3 %

## 2023-03-16 LAB — BRAIN NATRIURETIC PEPTIDE: B Natriuretic Peptide: 6.8 pg/mL (ref 0.0–100.0)

## 2023-03-16 NOTE — Progress Notes (Signed)
Oncology: Dr. Al Pimple HF Cardiology: Dr. Shirlee Latch  67 y.o. with history of DM, HTN, hyperlipidemia, endometrial cancer, and breast cancer was referred by Dr. Darnelle Catalan for cardio-oncology evaluation.  Initial breast cancer diagnosis was in 2012.  She had left breast lumpectomy and radiation.  Recurrence in 7/20, ER+/PR-/HER2+.  She had chemotherapy with trastuzumab/pertuzumab/carboplatin/docetaxol x 6 starting 2/21.  With fall in EF in 4/21, Herceptin was held.  Echo in 4/21 showed EF 45-50%, Coreg was started. Echo in 6/21 showed EF back up to 55-60%, and Herceptin was restarted.  In 6/21, she had lumpectomy.  Echo in 7/21 showed EF stable at 55-60%.    Echo in 9/21 showed EF 55-60% with normal RV, strain images did not track well.  Coronary CTA was done in 9/21, showing coronary artery calcium score 0 and no significant CAD.    PET in 9/21 with new lymphadenopathy.  She was started on TDM-1/Kadcyla but later changed back to Herceptin which will continue for long-term.   In 12/21, she had a cervical discectomy and fusion.   Echo in 2/22 showed EF 55% with GLS -10.7% but poor endocardial tracing. Normal RV.  Echo in 4/22 showed EF 50-55%, mild LVH, normal RV, GLS -14.3% but poor endocardial tracing. Echo in 7/22 showed EF 55-60%, mild LVH, mild LAE, GLS -20.1%, normal RV, PASP 32 mmHg.  Echo in 10/22 showed EF 55-60%, mild LVH, GLS -20.1%, normal RV. Echo in 5/23 showed EF 55-60% with GLS -22.1%, normal RV.   Echo 11/23 showed EF 55% on my read (reported 50-55%), GLS -19.8%, normal RV.  Echo was done today and reviewed, technically difficult study with inaccurate strain imaging due to poor endocardial tracing, EF 55%, normal RV, normal IVC.   PYP scan in 3/24 was grade 2 with H/CL 1.08 (equivocal).   Today she returns for HF follow up. Weight down 13 lbs, now on tirzepatide. She still fatigues easily.  Walks with a cane.  Gets short of breath walking longer distances, changing clothes/dressing.  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 No chest pain.  No lightheadedness/palpitations.   Labs (5/21): K 4.1, creatinine 0.89 Labs (7/21): K 4.2, creatinine 0.95 Labs (8/21): LDL 81, BNP 36 Labs (9/21): K 3.7, creatinine 0.94 Labs (1/22): K 3.2, creatinine 1.14 Labs (4/22): K 4.6, creatinine 1.02 Labs (5/22): negative serum and urine immunofixation Labs (6/22): ESR 87, ANA negative, ANCA negative, CK 225, hgb 10.2, LDL 110, K 3.7, creatinine 1.38 Labs (10/22): K 3.7, creatinine 1.28 Labs (5/23): K 3.9, creatinine 0.96 Labs (9/23): LDL 77 Labs (11/23): K 3.4, creatinine 1.10 Labs (12/23): K 3.7, creatinine 0.90 Labs (5/24): K 4, creatinine 0.89  PMH: 1. HTN 2. Diabetes 3. Endometrial carcinoma: 8/17 diagnosis, had hysterectomy and BSO.  4. Hyperlipidemia 5. Breast cancer: Initial diagnosis in 2012.  Had left breast lumpectomy and radiation.  Recurrence in 7/20, ER+/PR-/HER2+.  She had chemotherapy with trastuzumab/pertuzumab/carboplatin/docetaxol x 6 starting 2/21.  With fall in EF in 4/21, Herceptin was held but later restarted.  Lumpectomy 6/21.  - Echo (1/21): EF 60-65%, GLS -20%, normal RV.  - Echo (4/21): EF 45-50%, global hypokinesis, GLS -12.6%, mildly decreased RV function.  - Echo (6/21): EF 55-60%, GLS -14.4%, mild LVH, PASP 38, RV normal.  - Echo (7/21): EF 55-60%, mild LVH, GLS -16.9%, RV  Normal - Echo (9/21): EF 55-60%, grade 2 diastolic dysfunction, GLS -11.6% (poor endocardial tracking), normal RV, normal IVC.  - Echo (2/22): EF 55%, GLS -  10.7% (poor endocardial tracking), normal RV.  - Echo (4/22): EF 50-55%, mild LVH, normal RV, GLS -14.3% but poor endocardial tracing. - Echo (7/22): EF 55-60%, mild LVH, mild LAE, GLS -20.1%, normal RV, PASP 32 mmHg.  - Echo (10/22): EF 55-60%, mild LVH, GLS -20.1%, normal RV.  - Echo (5/23): EF 55-60% with GLS -22.1%, normal RV.  - Echo (11/23): EF 55% on my read (reported 50-55%), GLS -19.8%,  normal RV. - Echo (5/24): technically difficult study with inaccurate strain imaging due to poor endocardial tracing, EF 55%, normal RV, normal IVC.  - PYP scan (3/24): grade 2 with H/CL 1.08 (equivocal).  6. Coronary calcium score scan (8/21): CAC 0 Agatston units.  - Coronary CTA (9/21): CAC 0 Agatston units.  No significant CAD.  7. DVT: Left leg, 8/21.  8. Cervical discectomy and fusion in 12/21.  9. Peripheral neuropathy 10. Carpal tunnel syndrome  Social History   Socioeconomic History   Marital status: Divorced    Spouse name: Not on file   Number of children: 0   Years of education: Not on file   Highest education level: Not on file  Occupational History   Occupation: Child psychotherapist    Employer: [employer]    Comment: retired  Tobacco Use   Smoking status: Never   Smokeless tobacco: Never  Building services engineer Use: Never used  Substance and Sexual Activity   Alcohol use: Not Currently    Comment: occasional on holidays   Drug use: Never   Sexual activity: Not Currently    Birth control/protection: Post-menopausal, Surgical  Other Topics Concern   Not on file  Social History Narrative   1 son deceased with homicide   Patient has been divorced twice   Right handed   Drinks caffeine   One story    Social Determinants of Health   Financial Resource Strain: Not on file  Food Insecurity: No Food Insecurity (11/23/2022)   Hunger Vital Sign    Worried About Running Out of Food in the Last Year: Never true    Ran Out of Food in the Last Year: Never true  Transportation Needs: Unmet Transportation Needs (10/25/2020)   PRAPARE - Administrator, Civil Service (Medical): Yes    Lack of Transportation (Non-Medical): Yes  Physical Activity: Not on file  Stress: Not on file  Social Connections: Not on file  Intimate Partner Violence: Not on file   Family History  Problem Relation Age of Onset   Diabetes Mother    Hypertension Mother    Ovarian cancer  Mother    Heart disease Father        COPD   Alcohol abuse Father        ETOH dependence   Breast cancer Maternal Aunt        dx in her 51s   Lung cancer Maternal Uncle    Breast cancer Paternal Aunt    Cancer Maternal Grandmother        salivary gland cancer   Colon cancer Neg Hx    Colon polyps Neg Hx    Esophageal cancer Neg Hx    Stomach cancer Neg Hx    Rectal cancer Neg Hx    ROS: All systems reviewed and negative except as per HPI.   Current Outpatient Medications  Medication Sig Dispense Refill   acetaminophen (TYLENOL) 500 MG tablet Take 500 mg by mouth every 6 (six) hours as needed for moderate pain.  Ascorbic Acid (VITAMIN C PO) Take 1 tablet by mouth daily.     carvedilol (COREG) 6.25 MG tablet Take 6.25 mg by mouth 2 (two) times daily.     Cholecalciferol (VITAMIN D3 PO) Take 1 capsule by mouth daily.     Cyanocobalamin (B-12 PO) Take 1 capsule by mouth daily.     furosemide (LASIX) 40 MG tablet Take 1.5 tablets (60 mg total) by mouth daily. 45 tablet 8   gabapentin (NEURONTIN) 300 MG capsule Take 600 mg by mouth at bedtime as needed (pain).     methylcellulose (CITRUCEL) oral powder Take 1 packet by mouth daily.     Multiple Vitamins-Minerals (MULTIVITAMIN WITH MINERALS) tablet Take 1 tablet by mouth daily.     potassium chloride SA (KLOR-CON M20) 20 MEQ tablet Take 1 tablet (20 mEq total) by mouth 2 (two) times daily. 60 tablet 8   silver sulfADIAZINE (SILVADENE) 1 % cream Apply 1 application  topically as needed (wound care).     telmisartan-hydrochlorothiazide (MICARDIS HCT) 80-12.5 MG tablet Take 1 tablet by mouth daily. 90 tablet 3   tirzepatide (MOUNJARO) 2.5 MG/0.5ML Pen Inject 2.5 mg into the skin once a week. 2 mL 0   XARELTO 20 MG TABS tablet TAKE 1 TABLET BY MOUTH DAILY WITH SUPPER. 30 tablet 11   No current facility-administered medications for this encounter.   Facility-Administered Medications Ordered in Other Encounters  Medication Dose Route  Frequency Provider Last Rate Last Admin   cloNIDine (CATAPRES) tablet 0.1 mg  0.1 mg Oral Daily Tanner, Zenaida Niece E., PA-C   0.1 mg at 11/21/19 1742   heparin lock flush 100 unit/mL  500 Units Intracatheter Once [name], MD       Wt Readings from Last 3 Encounters:  03/16/23 105.1 kg (231 lb 9.6 oz)  03/12/23 106.8 kg (235 lb 8 oz)  02/16/23 106.1 kg (234 lb)   BP 108/60   Pulse 68   Wt 105.1 kg (231 lb 9.6 oz)   LMP 05/18/2016   SpO2 98%   BMI 46.78 kg/m  General: NAD Neck: No JVD, no thyromegaly or thyroid nodule.  Lungs: Clear to auscultation bilaterally with normal respiratory effort. CV: Nondisplaced PMI.  Heart regular S1/S2, no S3/S4, no murmur.  No peripheral edema.  No carotid bruit.  Normal pedal pulses.  Abdomen: Soft, nontender, no hepatosplenomegaly, no distention.  Skin: Intact without lesions or rashes.  Neurologic: Alert and oriented x 3.  Psych: Normal affect. Extremities: No clubbing or cyanosis.  HEENT: Normal.   Assessment/Plan: 1. Chemotherapy-related cardiomyopathy: Fall in EF and strain in the setting of Herceptin use.  Echo in 4/21 with EF 45-50% with GLS -12.6%.  Herceptin was held, Coreg started, and echo in 6/21 showed EF up to 55-60% with GLS -14.4% => Herceptin restarted.  Echo in 7/21 with EF 55-60%, GLS more negative at -16.9%.  Echo in 2/22 and in 9/21 showed normal EF but less negative strain.  However, strain images did not appear to track well on either echo.  Echo in 4/22  showed EF 50-55%, again with poor strain tracking.  Echo in 7/22 and in 10/22 showed EF 55-60% with GLS -20.1% and mild LVH. Echo in 5/23 showed EF 55-60% with GLS -22.1%, normal RV.  Echo 11/23 was read as 50-55% but looked like 55% to me with GLS -19.8% and normal RV. Echo today was a technically difficult study with EF 55% and inaccurate strain.  I think there has been no significant change.  NYHA class III symptoms, she is not volume overloaded on exam. Weight is down.  -  Continue Lasix 60 mg daily. BMET/BNP today.  - Increase Coreg to 6.25 mg bid (has only been taking once daily).  - Continue telmisartan.   - She should be safe to continue Herceptin.  With stable echoes and long-term Herceptin use, we can continue to obtain echoes at 6 month intervals.  - With painful peripheral neuropathy and carpal tunnel syndrome as well as LVH with diastolic CHF, I have been worried about the possibility of cardiac amyloidosis.  Urine and serum immunofixations negative.  PYP scan in 3/24 was equivocal.  I will send TTR gene testing today and will arrange for cardiac MRI to see if MRI LGE pattern is concerning for cardiac amyloidosis.  2. HTN: BP controlled.   3. DVT: on left.   - Continue Xarelto long-term given DVT in setting of malignancy.  4. Obesity: Difficult for her to exercise due to neuropathy, poor balance. Body mass index is 46.78 kg/m. - She is now on tirzepatide with weight trending down.  Follow up in 6 wks with APP.   Marca Ancona  03/16/2023

## 2023-03-16 NOTE — Progress Notes (Signed)
Blood collected for TTR genetic testing per Dr McLean.  Order form completed and both shipped by FedEx to [facility].   

## 2023-03-16 NOTE — Patient Instructions (Signed)
PLEASE take Carvedilol 6.25 mg Twice daily  Labs done today, your results will be available in MyChart, we will contact you for abnormal readings.  Genetic test has been done, this has to be sent to [place] to be processed and can take 1-2 weeks to get results back.  We will let you know the results.  Your physician recommends that you schedule a follow-up appointment in: 6 weeks  If you have any questions or concerns before your next appointment please send [place] a message through [place] or call our office at [phone].    TO LEAVE A MESSAGE FOR THE NURSE SELECT OPTION 2, PLEASE LEAVE A MESSAGE INCLUDING: YOUR NAME DATE OF BIRTH CALL BACK NUMBER REASON FOR CALL**this is important as we prioritize the call backs  YOU WILL RECEIVE A CALL BACK THE SAME DAY AS LONG AS YOU CALL BEFORE 4:00 PM  At the [facility], you and your health needs are our priority. As part of our continuing mission to provide you with exceptional heart care, we have created designated Provider Care Teams. These Care Teams include your primary Cardiologist (physician) and Advanced Practice Providers (APPs- Physician Assistants and Nurse Practitioners) who all work together to provide you with the care you need, when you need it.   You may see any of the following providers on your designated Care Team at your next follow up: Dr Arvilla Meres Dr Marca Ancona Dr. Marcos Eke, NP Robbie Lis, [place] [facility], [place] Brynda Peon, NP Karle Plumber, PharmD   Please be sure to bring in all your medications bottles to every appointment.    Thank you for choosing [facility]

## 2023-03-22 ENCOUNTER — Telehealth: Payer: Self-pay | Admitting: Pharmacist

## 2023-03-22 MED ORDER — MOUNJARO 5 MG/0.5ML ~~LOC~~ SOAJ: 5.0000 mg | SUBCUTANEOUS | 0 refills | Status: DC

## 2023-03-22 NOTE — Telephone Encounter (Signed)
Called pt to follow up with Mounjaro tolerability. I sent in rx for 2.5mg  dose on 4/22. Pt states she took 2 doses, then left her med behind when she was out of town dealing with her mother's death. She got home today. Tolerated 2 doses well and has been on this dose in the past. Advised her to take last 2 doses of 2.5mg , starting today. Then will increase to 5mg  dose. Rx sent in, will call pt in 6 weeks for further dose titration.

## 2023-03-23 ENCOUNTER — Telehealth (HOSPITAL_COMMUNITY): Payer: Self-pay | Admitting: Emergency Medicine

## 2023-03-23 NOTE — Telephone Encounter (Signed)
Reaching out to patient to offer assistance regarding upcoming cardiac imaging study; pt verbalizes understanding of appt date/time, parking situation and where to check in, pre-test NPO status and medications ordered, and verified current allergies; name and call back number provided for further questions should they arise Maisa Bedingfield RN Navigator Cardiac Imaging [facility] [phone] office [phone] cell 

## 2023-03-24 ENCOUNTER — Other Ambulatory Visit (HOSPITAL_COMMUNITY): Payer: Self-pay | Admitting: Cardiology

## 2023-03-24 ENCOUNTER — Ambulatory Visit (HOSPITAL_COMMUNITY)
Admission: RE | Admit: 2023-03-24 | Discharge: 2023-03-24 | Disposition: A | Discharge status: Home / Self Care | Payer: Medicare Other | Source: Ambulatory Visit | Attending: Cardiology | Admitting: Cardiology

## 2023-03-24 DIAGNOSIS — E8589 Other amyloidosis: Secondary | ICD-10-CM

## 2023-03-24 DIAGNOSIS — I429 Cardiomyopathy, unspecified: Secondary | ICD-10-CM | POA: Diagnosis not present

## 2023-03-24 MED ORDER — GADOBUTROL 1 MMOL/ML IV SOLN
11.0000 mL | Freq: Once | INTRAVENOUS | Status: AC | PRN
  Administered 2023-03-24: 11 mL via INTRAVENOUS

## 2023-04-01 ENCOUNTER — Inpatient Hospital Stay: Payer: Medicare Other

## 2023-04-01 ENCOUNTER — Ambulatory Visit: Payer: Medicare Other

## 2023-04-01 ENCOUNTER — Other Ambulatory Visit: Payer: Self-pay

## 2023-04-01 ENCOUNTER — Inpatient Hospital Stay: Payer: Medicare Other | Attending: Hematology and Oncology

## 2023-04-01 ENCOUNTER — Other Ambulatory Visit: Payer: Medicare Other

## 2023-04-01 VITALS — BP 118/84 | HR 70 | Temp 98.1°F | Resp 19 | Wt 232.8 lb

## 2023-04-01 DIAGNOSIS — C50812 Malignant neoplasm of overlapping sites of left female breast: Secondary | ICD-10-CM | POA: Insufficient documentation

## 2023-04-01 DIAGNOSIS — Z9079 Acquired absence of other genital organ(s): Secondary | ICD-10-CM | POA: Insufficient documentation

## 2023-04-01 DIAGNOSIS — Z90710 Acquired absence of both cervix and uterus: Secondary | ICD-10-CM | POA: Insufficient documentation

## 2023-04-01 DIAGNOSIS — C773 Secondary and unspecified malignant neoplasm of axilla and upper limb lymph nodes: Secondary | ICD-10-CM | POA: Insufficient documentation

## 2023-04-01 DIAGNOSIS — Z90722 Acquired absence of ovaries, bilateral: Secondary | ICD-10-CM | POA: Diagnosis not present

## 2023-04-01 DIAGNOSIS — Z171 Estrogen receptor negative status [ER-]: Secondary | ICD-10-CM

## 2023-04-01 DIAGNOSIS — Z79899 Other long term (current) drug therapy: Secondary | ICD-10-CM | POA: Diagnosis not present

## 2023-04-01 DIAGNOSIS — Z8542 Personal history of malignant neoplasm of other parts of uterus: Secondary | ICD-10-CM | POA: Insufficient documentation

## 2023-04-01 DIAGNOSIS — Z5112 Encounter for antineoplastic immunotherapy: Secondary | ICD-10-CM | POA: Insufficient documentation

## 2023-04-01 DIAGNOSIS — Z95828 Presence of other vascular implants and grafts: Secondary | ICD-10-CM

## 2023-04-01 LAB — CBC WITH DIFFERENTIAL (CANCER CENTER ONLY)
Abs Immature Granulocytes: 0.03 10*3/uL (ref 0.00–0.07)
Basophils Absolute: 0 10*3/uL (ref 0.0–0.1)
Basophils Relative: 0 %
Eosinophils Absolute: 0.1 10*3/uL (ref 0.0–0.5)
Eosinophils Relative: 2 %
HCT: 33.9 % — ABNORMAL LOW (ref 36.0–46.0)
Hemoglobin: 10.7 g/dL — ABNORMAL LOW (ref 12.0–15.0)
Immature Granulocytes: 1 %
Lymphocytes Relative: 25 %
Lymphs Abs: 1.7 10*3/uL (ref 0.7–4.0)
MCH: 28.8 pg (ref 26.0–34.0)
MCHC: 31.6 g/dL (ref 30.0–36.0)
MCV: 91.4 fL (ref 80.0–100.0)
Monocytes Absolute: 0.8 10*3/uL (ref 0.1–1.0)
Monocytes Relative: 12 %
Neutro Abs: 4 10*3/uL (ref 1.7–7.7)
Neutrophils Relative %: 60 %
Platelet Count: 201 10*3/uL (ref 150–400)
RBC: 3.71 MIL/uL — ABNORMAL LOW (ref 3.87–5.11)
RDW: 14.6 % (ref 11.5–15.5)
WBC Count: 6.6 10*3/uL (ref 4.0–10.5)
nRBC: 0 % (ref 0.0–0.2)

## 2023-04-01 LAB — CMP (CANCER CENTER ONLY)
ALT: 14 U/L (ref 0–44)
AST: 17 U/L (ref 15–41)
Albumin: 3.7 g/dL (ref 3.5–5.0)
Alkaline Phosphatase: 42 U/L (ref 38–126)
Anion gap: 7 (ref 5–15)
BUN: 33 mg/dL — ABNORMAL HIGH (ref 8–23)
CO2: 29 mmol/L (ref 22–32)
Calcium: 9.6 mg/dL (ref 8.9–10.3)
Chloride: 103 mmol/L (ref 98–111)
Creatinine: 1.08 mg/dL — ABNORMAL HIGH (ref 0.44–1.00)
GFR, Estimated: 57 mL/min — ABNORMAL LOW (ref >60)
Glucose, Bld: 116 mg/dL — ABNORMAL HIGH (ref 70–99)
Potassium: 3.9 mmol/L (ref 3.5–5.1)
Sodium: 139 mmol/L (ref 135–145)
Total Bilirubin: 0.3 mg/dL (ref 0.3–1.2)
Total Protein: 8.4 g/dL — ABNORMAL HIGH (ref 6.5–8.1)

## 2023-04-01 MED ORDER — SODIUM CHLORIDE 0.9% FLUSH
10.0000 mL | Freq: Once | INTRAVENOUS | Status: AC
  Administered 2023-04-01: 10 mL

## 2023-04-01 MED ORDER — ACETAMINOPHEN 325 MG PO TABS
650.0000 mg | ORAL_TABLET | Freq: Once | ORAL | Status: AC
  Administered 2023-04-01: 650 mg via ORAL
  Filled 2023-04-01: qty 2

## 2023-04-01 MED ORDER — DIPHENHYDRAMINE HCL 25 MG PO CAPS
25.0000 mg | ORAL_CAPSULE | Freq: Once | ORAL | Status: AC
  Administered 2023-04-01: 25 mg via ORAL
  Filled 2023-04-01: qty 1

## 2023-04-01 MED ORDER — SODIUM CHLORIDE 0.9% FLUSH
10.0000 mL | INTRAVENOUS | Status: DC | PRN
  Administered 2023-04-01: 10 mL

## 2023-04-01 MED ORDER — SODIUM CHLORIDE 0.9 % IV SOLN: Freq: Once | INTRAVENOUS | Status: AC

## 2023-04-01 MED ORDER — TRASTUZUMAB-ANNS CHEMO 150 MG IV SOLR
6.0000 mg/kg | Freq: Once | INTRAVENOUS | Status: AC
  Administered 2023-04-01: 600 mg via INTRAVENOUS
  Filled 2023-04-01: qty 28.57

## 2023-04-01 MED ORDER — HEPARIN SOD (PORK) LOCK FLUSH 100 UNIT/ML IV SOLN
500.0000 [IU] | Freq: Once | INTRAVENOUS | Status: AC | PRN
  Administered 2023-04-01: 500 [IU]

## 2023-04-01 NOTE — Patient Instructions (Signed)
[facility]  Discharge Instructions: Thank you for choosing [facility] to provide your oncology and hematology care.   If you have a lab appointment with the Cancer Center, please go directly to the Cancer Center and check in at the registration area.   Wear comfortable clothing and clothing appropriate for easy access to any Portacath or PICC line.   We strive to give you quality time with your provider. You may need to reschedule your appointment if you arrive late (15 or more minutes).  Arriving late affects you and other patients whose appointments are after yours.  Also, if you miss three or more appointments without notifying the office, you may be dismissed from the clinic at the provider's discretion.      For prescription refill requests, have your pharmacy contact our office and allow 72 hours for refills to be completed.    Today you received the following chemotherapy and/or immunotherapy agents: Kanjinti.      To help prevent nausea and vomiting after your treatment, we encourage you to take your nausea medication as directed.  BELOW ARE SYMPTOMS THAT SHOULD BE REPORTED IMMEDIATELY: *FEVER GREATER THAN 100.4 F (38 C) OR HIGHER *CHILLS OR SWEATING *NAUSEA AND VOMITING THAT IS NOT CONTROLLED WITH YOUR NAUSEA MEDICATION *UNUSUAL SHORTNESS OF BREATH *UNUSUAL BRUISING OR BLEEDING *URINARY PROBLEMS (pain or burning when urinating, or frequent urination) *BOWEL PROBLEMS (unusual diarrhea, constipation, pain near the anus) TENDERNESS IN MOUTH AND THROAT WITH OR WITHOUT PRESENCE OF ULCERS (sore throat, sores in mouth, or a toothache) UNUSUAL RASH, SWELLING OR PAIN  UNUSUAL VAGINAL DISCHARGE OR ITCHING   Items with * indicate a potential emergency and should be followed up as soon as possible or go to the Emergency Department if any problems should occur.  Please show the CHEMOTHERAPY ALERT CARD or IMMUNOTHERAPY ALERT CARD at  check-in to the Emergency Department and triage nurse.  Should you have questions after your visit or need to cancel or reschedule your appointment, please contact [facility]  Dept: [phone]  and follow the prompts.  Office hours are 8:00 a.m. to 4:30 p.m. Monday - Friday. Please note that voicemails left after 4:00 p.m. may not be returned until the following business day.  We are closed weekends and major holidays. You have access to a nurse at all times for urgent questions. Please call the main number to the clinic Dept: [phone] and follow the prompts.   For any non-urgent questions, you may also contact your provider using MyChart. We now offer e-Visits for anyone 18 and older to request care online for non-urgent symptoms. For details visit [email or web address].   Also download the MyChart app! Go to the app store, search "MyChart", open the app, select [facility], and log in with your MyChart username and password.   

## 2023-04-07 ENCOUNTER — Ambulatory Visit: Payer: Medicare Other | Admitting: Registered"

## 2023-04-14 ENCOUNTER — Other Ambulatory Visit (HOSPITAL_COMMUNITY): Payer: Self-pay | Admitting: Cardiology

## 2023-04-21 ENCOUNTER — Inpatient Hospital Stay: Payer: Medicare Other | Attending: Hematology and Oncology

## 2023-04-21 ENCOUNTER — Other Ambulatory Visit: Payer: Self-pay

## 2023-04-21 ENCOUNTER — Inpatient Hospital Stay (HOSPITAL_BASED_OUTPATIENT_CLINIC_OR_DEPARTMENT_OTHER): Payer: Medicare Other | Admitting: Hematology and Oncology

## 2023-04-21 ENCOUNTER — Encounter: Payer: Self-pay | Admitting: Hematology and Oncology

## 2023-04-21 ENCOUNTER — Ambulatory Visit: Payer: Medicare Other | Admitting: Hematology and Oncology

## 2023-04-21 ENCOUNTER — Ambulatory Visit: Payer: Medicare Other

## 2023-04-21 ENCOUNTER — Other Ambulatory Visit: Payer: Medicare Other

## 2023-04-21 ENCOUNTER — Inpatient Hospital Stay: Payer: Medicare Other

## 2023-04-21 VITALS — BP 157/90 | HR 62 | Temp 97.3°F | Resp 18 | Wt 240.1 lb

## 2023-04-21 DIAGNOSIS — Z171 Estrogen receptor negative status [ER-]: Secondary | ICD-10-CM | POA: Insufficient documentation

## 2023-04-21 DIAGNOSIS — Z86718 Personal history of other venous thrombosis and embolism: Secondary | ICD-10-CM | POA: Diagnosis not present

## 2023-04-21 DIAGNOSIS — Z5112 Encounter for antineoplastic immunotherapy: Secondary | ICD-10-CM | POA: Diagnosis present

## 2023-04-21 DIAGNOSIS — C773 Secondary and unspecified malignant neoplasm of axilla and upper limb lymph nodes: Secondary | ICD-10-CM | POA: Diagnosis present

## 2023-04-21 DIAGNOSIS — Z95828 Presence of other vascular implants and grafts: Secondary | ICD-10-CM

## 2023-04-21 DIAGNOSIS — Z7901 Long term (current) use of anticoagulants: Secondary | ICD-10-CM | POA: Insufficient documentation

## 2023-04-21 DIAGNOSIS — Z90722 Acquired absence of ovaries, bilateral: Secondary | ICD-10-CM | POA: Insufficient documentation

## 2023-04-21 DIAGNOSIS — C50112 Malignant neoplasm of central portion of left female breast: Secondary | ICD-10-CM

## 2023-04-21 DIAGNOSIS — C50812 Malignant neoplasm of overlapping sites of left female breast: Secondary | ICD-10-CM

## 2023-04-21 DIAGNOSIS — Z90710 Acquired absence of both cervix and uterus: Secondary | ICD-10-CM | POA: Insufficient documentation

## 2023-04-21 DIAGNOSIS — Z8542 Personal history of malignant neoplasm of other parts of uterus: Secondary | ICD-10-CM | POA: Diagnosis not present

## 2023-04-21 DIAGNOSIS — E859 Amyloidosis, unspecified: Secondary | ICD-10-CM | POA: Diagnosis not present

## 2023-04-21 DIAGNOSIS — Z9079 Acquired absence of other genital organ(s): Secondary | ICD-10-CM | POA: Diagnosis not present

## 2023-04-21 LAB — CMP (CANCER CENTER ONLY)
ALT: 15 U/L (ref 0–44)
AST: 17 U/L (ref 15–41)
Albumin: 3.4 g/dL — ABNORMAL LOW (ref 3.5–5.0)
Alkaline Phosphatase: 40 U/L (ref 38–126)
Anion gap: 6 (ref 5–15)
BUN: 17 mg/dL (ref 8–23)
CO2: 27 mmol/L (ref 22–32)
Calcium: 9.4 mg/dL (ref 8.9–10.3)
Chloride: 107 mmol/L (ref 98–111)
Creatinine: 0.85 mg/dL (ref 0.44–1.00)
GFR, Estimated: >60 mL/min (ref >60)
Glucose, Bld: 98 mg/dL (ref 70–99)
Potassium: 3.8 mmol/L (ref 3.5–5.1)
Sodium: 140 mmol/L (ref 135–145)
Total Bilirubin: 0.3 mg/dL (ref 0.3–1.2)
Total Protein: 6.9 g/dL (ref 6.5–8.1)

## 2023-04-21 LAB — CBC WITH DIFFERENTIAL (CANCER CENTER ONLY)
Abs Immature Granulocytes: 0.02 10*3/uL (ref 0.00–0.07)
Basophils Absolute: 0 10*3/uL (ref 0.0–0.1)
Basophils Relative: 0 %
Eosinophils Absolute: 0.1 10*3/uL (ref 0.0–0.5)
Eosinophils Relative: 1 %
HCT: 29.8 % — ABNORMAL LOW (ref 36.0–46.0)
Hemoglobin: 9.3 g/dL — ABNORMAL LOW (ref 12.0–15.0)
Immature Granulocytes: 0 %
Lymphocytes Relative: 30 %
Lymphs Abs: 1.5 10*3/uL (ref 0.7–4.0)
MCH: 28.5 pg (ref 26.0–34.0)
MCHC: 31.2 g/dL (ref 30.0–36.0)
MCV: 91.4 fL (ref 80.0–100.0)
Monocytes Absolute: 0.6 10*3/uL (ref 0.1–1.0)
Monocytes Relative: 11 %
Neutro Abs: 3 10*3/uL (ref 1.7–7.7)
Neutrophils Relative %: 58 %
Platelet Count: 182 10*3/uL (ref 150–400)
RBC: 3.26 MIL/uL — ABNORMAL LOW (ref 3.87–5.11)
RDW: 14.6 % (ref 11.5–15.5)
WBC Count: 5.1 10*3/uL (ref 4.0–10.5)
nRBC: 0 % (ref 0.0–0.2)

## 2023-04-21 MED ORDER — HEPARIN SOD (PORK) LOCK FLUSH 100 UNIT/ML IV SOLN
500.0000 [IU] | Freq: Once | INTRAVENOUS | Status: AC | PRN
  Administered 2023-04-21: 500 [IU]

## 2023-04-21 MED ORDER — SODIUM CHLORIDE 0.9% FLUSH
10.0000 mL | INTRAVENOUS | Status: DC | PRN
  Administered 2023-04-21: 10 mL

## 2023-04-21 MED ORDER — TRASTUZUMAB-ANNS CHEMO 150 MG IV SOLR
6.0000 mg/kg | Freq: Once | INTRAVENOUS | Status: AC
  Administered 2023-04-21: 600 mg via INTRAVENOUS
  Filled 2023-04-21: qty 28.57

## 2023-04-21 MED ORDER — SODIUM CHLORIDE 0.9 % IV SOLN: Freq: Once | INTRAVENOUS | Status: AC

## 2023-04-21 MED ORDER — DIPHENHYDRAMINE HCL 25 MG PO CAPS
25.0000 mg | ORAL_CAPSULE | Freq: Once | ORAL | Status: AC
  Administered 2023-04-21: 25 mg via ORAL
  Filled 2023-04-21: qty 1

## 2023-04-21 MED ORDER — ACETAMINOPHEN 325 MG PO TABS
650.0000 mg | ORAL_TABLET | Freq: Once | ORAL | Status: AC
  Administered 2023-04-21: 650 mg via ORAL
  Filled 2023-04-21: qty 2

## 2023-04-21 MED ORDER — SODIUM CHLORIDE 0.9% FLUSH
10.0000 mL | Freq: Once | INTRAVENOUS | Status: AC
  Administered 2023-04-21: 10 mL

## 2023-04-21 NOTE — Patient Instructions (Signed)
[facility]  Discharge Instructions: Thank you for choosing [facility] to provide your oncology and hematology care.   If you have a lab appointment with the Cancer Center, please go directly to the Cancer Center and check in at the registration area.   Wear comfortable clothing and clothing appropriate for easy access to any Portacath or PICC line.   We strive to give you quality time with your provider. You may need to reschedule your appointment if you arrive late (15 or more minutes).  Arriving late affects you and other patients whose appointments are after yours.  Also, if you miss three or more appointments without notifying the office, you may be dismissed from the clinic at the provider's discretion.      For prescription refill requests, have your pharmacy contact our office and allow 72 hours for refills to be completed.    Today you received the following chemotherapy and/or immunotherapy agents: Kanjinti.      To help prevent nausea and vomiting after your treatment, we encourage you to take your nausea medication as directed.  BELOW ARE SYMPTOMS THAT SHOULD BE REPORTED IMMEDIATELY: *FEVER GREATER THAN 100.4 F (38 C) OR HIGHER *CHILLS OR SWEATING *NAUSEA AND VOMITING THAT IS NOT CONTROLLED WITH YOUR NAUSEA MEDICATION *UNUSUAL SHORTNESS OF BREATH *UNUSUAL BRUISING OR BLEEDING *URINARY PROBLEMS (pain or burning when urinating, or frequent urination) *BOWEL PROBLEMS (unusual diarrhea, constipation, pain near the anus) TENDERNESS IN MOUTH AND THROAT WITH OR WITHOUT PRESENCE OF ULCERS (sore throat, sores in mouth, or a toothache) UNUSUAL RASH, SWELLING OR PAIN  UNUSUAL VAGINAL DISCHARGE OR ITCHING   Items with * indicate a potential emergency and should be followed up as soon as possible or go to the Emergency Department if any problems should occur.  Please show the CHEMOTHERAPY ALERT CARD or IMMUNOTHERAPY ALERT CARD at  check-in to the Emergency Department and triage nurse.  Should you have questions after your visit or need to cancel or reschedule your appointment, please contact [facility]  Dept: [phone]  and follow the prompts.  Office hours are 8:00 a.m. to 4:30 p.m. Monday - Friday. Please note that voicemails left after 4:00 p.m. may not be returned until the following business day.  We are closed weekends and major holidays. You have access to a nurse at all times for urgent questions. Please call the main number to the clinic Dept: [phone] and follow the prompts.   For any non-urgent questions, you may also contact your provider using MyChart. We now offer e-Visits for anyone 18 and older to request care online for non-urgent symptoms. For details visit [email or web address].   Also download the MyChart app! Go to the app store, search "MyChart", open the app, select [facility], and log in with your MyChart username and password.   

## 2023-04-21 NOTE — Progress Notes (Signed)
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 in her 76s   Lung cancer Maternal Uncle    Breast cancer Paternal Aunt    Cancer Maternal Grandmother        salivary gland cancer   Colon cancer Neg Hx    Colon polyps Neg Hx    Esophageal cancer Neg Hx    Stomach cancer Neg Hx    Rectal cancer Neg Hx     Review of Systems  Constitutional:  Positive for fatigue. Negative for appetite change, chills,  fever and unexpected weight change.  HENT:   Negative for hearing loss, lump/mass, sore throat and trouble swallowing.   Eyes:  Negative for eye problems and icterus.  Respiratory:  Negative for chest tightness, cough and shortness of breath.   Cardiovascular:  Negative for chest pain, leg swelling and palpitations.  Gastrointestinal:  Negative for abdominal distention, abdominal pain, constipation, diarrhea, nausea and vomiting.  Endocrine: Negative for hot flashes.  Genitourinary:  Negative for  difficulty urinating.   Musculoskeletal:  Positive for back pain (chronic, seeing neurosurgery/pain management). Negative for arthralgias.  Skin:  Negative for itching and rash.  Neurological:  Positive for numbness (chronic, improved after stopping Kadcyla, however also has diabetes). Negative for dizziness, extremity weakness and headaches.  Hematological:  Negative for adenopathy. Does not bruise/bleed easily.  Psychiatric/Behavioral:  Negative for depression. The patient is not nervous/anxious.       PHYSICAL EXAMINATION  ECOG PERFORMANCE STATUS: 1 - Symptomatic but completely ambulatory  Vitals:   04/21/23 1303  BP: (!) 157/90  Pulse: 62  Resp: 18  Temp: (!) 97.3 F (36.3 C)  SpO2: 98%     Physical Exam Constitutional:      General: She is not in acute distress.    Appearance: Normal appearance. She is not toxic-appearing.  HENT:     Head: Normocephalic and atraumatic.  Eyes:     General: No scleral icterus. Cardiovascular:     Rate and Rhythm: Normal rate and regular rhythm.  Pulmonary:     Effort: Pulmonary effort is normal.     Breath sounds: Normal breath sounds.  Chest:     Comments: Breast exam deferred. Abdominal:     General: Abdomen is flat. Bowel sounds are normal. There is no distension.     Palpations: Abdomen is soft.     Tenderness: There is no abdominal tenderness.  Musculoskeletal:        General: No swelling.     Cervical back: Neck supple.  Lymphadenopathy:     Cervical: No cervical adenopathy.  Skin:    General: Skin is warm and dry.     Findings: No rash.  Neurological:     General: No focal deficit present.     Mental Status: She is alert.  Psychiatric:        Mood and Affect: Mood normal.        Behavior: Behavior normal.     LABORATORY DATA:  CBC    Component Value Date/Time   WBC 5.1 04/21/2023 1222   WBC 6.4 09/27/2020 1114   RBC 3.26 (L) 04/21/2023 1222   HGB 9.3 (L) 04/21/2023 1222   HGB 11.9 03/30/2017 1044   HCT  29.8 (L) 04/21/2023 1222   HCT 37.4 03/30/2017 1044   PLT 182 04/21/2023 1222   PLT 238 03/30/2017 1044   MCV 91.4 04/21/2023 1222   MCV 85.0 03/30/2017 1044   MCH 28.5 04/21/2023 1222   MCHC 31.2 04/21/2023 1222   RDW 14.6 04/21/2023 1222   RDW 15.1 (H) 03/30/2017 1044   LYMPHSABS 1.5 04/21/2023 1222   LYMPHSABS 1.3 03/30/2017 1044   MONOABS 0.6 04/21/2023 1222   MONOABS 0.5 03/30/2017 1044   EOSABS 0.1 04/21/2023 1222   EOSABS 0.0 03/30/2017 1044   BASOSABS 0.0 04/21/2023 1222   BASOSABS 0.0 03/30/2017 1044    CMP     Component Value Date/Time   NA 139 04/01/2023 0757   NA 143 03/30/2017 1044   K 3.9 04/01/2023 0757   K 3.8  03/30/2017 1044   CL 103 04/01/2023 0757   CL 106 01/18/2013 0909   CO2 29 04/01/2023 0757   CO2 27 03/30/2017 1044   GLUCOSE 116 (H) 04/01/2023 0757   GLUCOSE 91 03/30/2017 1044   GLUCOSE 99 01/18/2013 0909   BUN 33 (H) 04/01/2023 0757   BUN 9.7 03/30/2017 1044   CREATININE 1.08 (H) 04/01/2023 0757   CREATININE 0.8 03/30/2017 1044   CALCIUM 9.6 04/01/2023 0757   CALCIUM 9.7 03/30/2017 1044   PROT 8.4 (H) 04/01/2023 0757   PROT 8.0 03/30/2017 1044   ALBUMIN 3.7 04/01/2023 0757   ALBUMIN 3.8 01/27/2021 1232   ALBUMIN 3.4 (L) 03/30/2017 1044   AST 17 04/01/2023 0757   AST 17 03/30/2017 1044   ALT 14 04/01/2023 0757   ALT 15 03/30/2017 1044   ALKPHOS 42 04/01/2023 0757   ALKPHOS 60 03/30/2017 1044   BILITOT 0.3 04/01/2023 0757   BILITOT 0.34 03/30/2017 1044   GFRNONAA 57 (L) 04/01/2023 0757   GFRAA >60 07/11/2020 0923         ASSESSMENT and THERAPY PLAN:   Malignant neoplasm of central portion of left breast in female, estrogen receptor negative (HCC)  Haddi is a 67 year old woman with history of metastatic breast cancer currently on treatment with Herceptin only.   Elizeabeth is doing well today.  She has no clinical signs of progression of metastatic breast cancer. We reviewed her neuropathy and these appear to be related to her  comorbidities including her alcohol consumption. Advised to abstain from alcohol cessation. We once again discussed about this toiday   Vedika will continue on Herceptin every 3 weeks and we will see her back in 6 weeks for labs follow-Young and her next appointment. Recent restaging with stable disease.Repeat in Aug 2024, this has been ordered.  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  RTC in 6 weeks with [place]. She will continue follow Young with cardiology for ECHO and cardiac monitoring. With regards to Retacrit, her hemoglobin has been over 10 g on most recent occasions, today's is 9.3.  Okay holding Retacrit for today. All questions were answered. The patient knows to call the clinic with any problems, questions or concerns. We can certainly see the patient much sooner if necessary.  Total encounter time:30 minutes*in face-to-face visit time, chart review, lab review, care coordination, order entry, and documentation of the encounter time.  *Total Encounter Time as defined by the Centers for Medicare and Medicaid Services includes, in addition to the face-to-face time of a patient visit (documented in the note above) non-face-to-face time: obtaining and reviewing outside history, ordering and reviewing medications, tests or procedures, care coordination (communications with other health care professionals or caregivers) and documentation in the medical record.

## 2023-04-27 ENCOUNTER — Encounter (HOSPITAL_COMMUNITY): Payer: Self-pay

## 2023-04-27 ENCOUNTER — Ambulatory Visit (INDEPENDENT_AMBULATORY_CARE_PROVIDER_SITE_OTHER): Payer: Medicare Other | Admitting: Internal Medicine

## 2023-04-27 ENCOUNTER — Ambulatory Visit (HOSPITAL_COMMUNITY)
Admission: RE | Admit: 2023-04-27 | Discharge: 2023-04-27 | Disposition: A | Discharge status: Home / Self Care | Payer: Medicare Other | Source: Ambulatory Visit | Attending: Family Medicine | Admitting: Family Medicine

## 2023-04-27 ENCOUNTER — Encounter: Payer: Self-pay | Admitting: Internal Medicine

## 2023-04-27 VITALS — BP 126/78 | HR 55 | Temp 98.2°F | Ht 59.0 in | Wt 237.0 lb

## 2023-04-27 VITALS — BP 118/60 | HR 70 | Wt 238.2 lb

## 2023-04-27 DIAGNOSIS — H6122 Impacted cerumen, left ear: Secondary | ICD-10-CM

## 2023-04-27 DIAGNOSIS — I5032 Chronic diastolic (congestive) heart failure: Secondary | ICD-10-CM | POA: Insufficient documentation

## 2023-04-27 DIAGNOSIS — F4321 Adjustment disorder with depressed mood: Secondary | ICD-10-CM

## 2023-04-27 DIAGNOSIS — E669 Obesity, unspecified: Secondary | ICD-10-CM | POA: Diagnosis not present

## 2023-04-27 DIAGNOSIS — H9202 Otalgia, left ear: Secondary | ICD-10-CM | POA: Diagnosis not present

## 2023-04-27 DIAGNOSIS — Z7901 Long term (current) use of anticoagulants: Secondary | ICD-10-CM | POA: Insufficient documentation

## 2023-04-27 DIAGNOSIS — Z6841 Body Mass Index (BMI) 40.0 and over, adult: Secondary | ICD-10-CM | POA: Insufficient documentation

## 2023-04-27 DIAGNOSIS — Z79899 Other long term (current) drug therapy: Secondary | ICD-10-CM | POA: Diagnosis not present

## 2023-04-27 DIAGNOSIS — T451X5A Adverse effect of antineoplastic and immunosuppressive drugs, initial encounter: Secondary | ICD-10-CM | POA: Diagnosis not present

## 2023-04-27 DIAGNOSIS — E119 Type 2 diabetes mellitus without complications: Secondary | ICD-10-CM | POA: Diagnosis not present

## 2023-04-27 DIAGNOSIS — F32A Depression, unspecified: Secondary | ICD-10-CM

## 2023-04-27 DIAGNOSIS — Z833 Family history of diabetes mellitus: Secondary | ICD-10-CM | POA: Diagnosis not present

## 2023-04-27 DIAGNOSIS — G8929 Other chronic pain: Secondary | ICD-10-CM | POA: Insufficient documentation

## 2023-04-27 DIAGNOSIS — I427 Cardiomyopathy due to drug and external agent: Secondary | ICD-10-CM | POA: Diagnosis not present

## 2023-04-27 DIAGNOSIS — Z8249 Family history of ischemic heart disease and other diseases of the circulatory system: Secondary | ICD-10-CM | POA: Diagnosis not present

## 2023-04-27 DIAGNOSIS — I10 Essential (primary) hypertension: Secondary | ICD-10-CM

## 2023-04-27 DIAGNOSIS — Z8542 Personal history of malignant neoplasm of other parts of uterus: Secondary | ICD-10-CM | POA: Diagnosis not present

## 2023-04-27 DIAGNOSIS — Z5982 Transportation insecurity: Secondary | ICD-10-CM | POA: Diagnosis not present

## 2023-04-27 DIAGNOSIS — E785 Hyperlipidemia, unspecified: Secondary | ICD-10-CM | POA: Diagnosis not present

## 2023-04-27 DIAGNOSIS — Z86718 Personal history of other venous thrombosis and embolism: Secondary | ICD-10-CM | POA: Insufficient documentation

## 2023-04-27 DIAGNOSIS — E559 Vitamin D deficiency, unspecified: Secondary | ICD-10-CM | POA: Diagnosis not present

## 2023-04-27 DIAGNOSIS — I110 Hypertensive heart disease with heart failure: Secondary | ICD-10-CM | POA: Diagnosis present

## 2023-04-27 DIAGNOSIS — E1142 Type 2 diabetes mellitus with diabetic polyneuropathy: Secondary | ICD-10-CM | POA: Insufficient documentation

## 2023-04-27 DIAGNOSIS — Z0001 Encounter for general adult medical examination with abnormal findings: Secondary | ICD-10-CM | POA: Diagnosis not present

## 2023-04-27 DIAGNOSIS — F419 Anxiety disorder, unspecified: Secondary | ICD-10-CM

## 2023-04-27 DIAGNOSIS — Z803 Family history of malignant neoplasm of breast: Secondary | ICD-10-CM | POA: Diagnosis not present

## 2023-04-27 DIAGNOSIS — Z853 Personal history of malignant neoplasm of breast: Secondary | ICD-10-CM | POA: Diagnosis not present

## 2023-04-27 NOTE — Patient Instructions (Addendum)
Please have your Shingrix (shingles) shots done at your local pharmacy.  Ok to restart the mounjaro at the lower dose  Your left ear was irrigated today  You will be contacted regarding the referral for: ENT , and Counseling  Please continue all other medications as before, and refills have been done if requested.  Please have the pharmacy call with any other refills you may need.  Please continue your efforts at being more active, low cholesterol diet, and weight control.  You are otherwise up to date with prevention measures today.  Please keep your appointments with your specialists as you may have planned - cardiology  No further lab work needed today  Please make an Appointment to return in 6 months, or sooner if needed

## 2023-04-27 NOTE — Patient Instructions (Signed)
  Testing/Procedures:  Your physician has requested that you have an echocardiogram IN 5 MONTHS. Echocardiography is a painless test that uses sound waves to create images of your heart. It provides your doctor with information about the size and shape of your heart and how well your heart's chambers and valves are working. You may receive an ultrasound enhancing agent through an IV if needed to better visualize your heart during the echo.This procedure takes approximately one hour. There are no restrictions for this procedure.   Follow-Up in: 5 MONTHS WITH Dr. Shirlee Latch WITH ECHO RIGHT BEFORE- PLEASE CALL OUR OFFICE AROUND NOVEMBER 2024 TO GET SCHEDULED FOR YOUR APPOINTMENT. PHONE NUMBER IS [phone] OPTION 2    At the [facility], you and your health needs are our priority. We have a designated team specialized in the treatment of Heart Failure. This Care Team includes your primary Heart Failure Specialized Cardiologist (physician), Advanced Practice Providers (APPs- Physician Assistants and Nurse Practitioners), and Pharmacist who all work together to provide you with the care you need, when you need it.   You may see any of the following providers on your designated Care Team at your next follow up:  Dr. Arvilla Meres Dr. Marca Ancona Dr. Marcos Eke, NP Robbie Lis, [place] [facility] [place], [place] Brynda Peon, NP Karle Plumber, PharmD   Please be sure to bring in all your medications bottles to every appointment.   Need to Contact us:  If you have any questions or concerns before your next appointment please send [place] a message through [place] or call our office at [phone].    TO LEAVE A MESSAGE FOR THE NURSE SELECT OPTION 2, PLEASE LEAVE A MESSAGE INCLUDING: YOUR NAME DATE OF BIRTH CALL BACK NUMBER REASON FOR CALL**this is important as we prioritize the call backs  YOU WILL RECEIVE A CALL BACK THE SAME DAY AS LONG AS YOU CALL  BEFORE 4:00 PM

## 2023-04-27 NOTE — Progress Notes (Signed)
Oncology: Dr. Al Pimple HF Cardiology: Dr. Shirlee Latch  67 y.o. with history of DM, HTN, hyperlipidemia, endometrial cancer, and breast cancer was referred by Dr. Darnelle Catalan for cardio-oncology evaluation.  Initial breast cancer diagnosis was in 2012.  She had left breast lumpectomy and radiation.  Recurrence in 7/20, ER+/PR-/HER2+.  She had chemotherapy with trastuzumab/pertuzumab/carboplatin/docetaxol x 6 starting 2/21.  With fall in EF in 4/21, Herceptin was held.  Echo in 4/21 showed EF 45-50%, Coreg was started. Echo in 6/21 showed EF back up to 55-60%, and Herceptin was restarted.  In 6/21, she had lumpectomy.  Echo in 7/21 showed EF stable at 55-60%.    Echo in 9/21 showed EF 55-60% with normal RV, strain images did not track well.  Coronary CTA was done in 9/21, showing coronary artery calcium score 0 and no significant CAD.    PET in 9/21 with new lymphadenopathy.  She was started on TDM-1/Kadcyla but later changed back to Herceptin which will continue for long-term.   In 12/21, she had a cervical discectomy and fusion.   Echo in 2/22 showed EF 55% with GLS -10.7% but poor endocardial tracing. Normal RV.  Echo in 4/22 showed EF 50-55%, mild LVH, normal RV, GLS -14.3% but poor endocardial tracing. Echo in 7/22 showed EF 55-60%, mild LVH, mild LAE, GLS -20.1%, normal RV, PASP 32 mmHg.  Echo in 10/22 showed EF 55-60%, mild LVH, GLS -20.1%, normal RV. Echo in 5/23 showed EF 55-60% with GLS -22.1%, normal RV.   Echo 11/23 showed EF 55% on my read (reported 50-55%), GLS -19.8%, normal RV.  Echo was done today and reviewed, technically difficult study with inaccurate strain imaging due to poor endocardial tracing, EF 55%, normal RV, normal IVC.   PYP scan in 3/24 was grade 2 with H/CL 1.08 (equivocal).   Follow up 5/24, stable NYHA III. TTR gene testing and cMRI arranged to evaluate for possible cardiac amyloidosis.   cMRI (6/24) showed LVEF 53%, RVEF 52%, no evidence for cardiac amyloidosis. [facility] gene  testing for hATTR was negative.  Today she returns for HF follow up. Overall feeling fatigued. She struggles with chronic low back pain. She is not SOB walking on flat ground. Has "good and bad days."  Has ankle/leg swelling. Has occasional dizziness and balance disturbance she attributes to her neuropathy. Denies palpitations, CP, or PND/Orthopnea. Appetite ok. No fever or chills. She does not weigh regularly at home. Taking all medications.   Labs (5/21): K 4.1, creatinine 0.89 Labs (7/21): K 4.2, creatinine 0.95 Labs (8/21): LDL 81, BNP 36 Labs (9/21): K 3.7, creatinine 0.94 Labs (1/22): K 3.2, creatinine 1.14 Labs (4/22): K 4.6, creatinine 1.02 Labs (5/22): negative serum and urine immunofixation Labs (6/22): ESR 87, ANA negative, ANCA negative, CK 225, hgb 10.2, LDL 110, K 3.7, creatinine 1.38 Labs (10/22): K 3.7, creatinine 1.28 Labs (5/23): K 3.9, creatinine 0.96 Labs (9/23): LDL 77 Labs (11/23): K 3.4, creatinine 1.10 Labs (12/23): K 3.7, creatinine 0.90 Labs (5/24): K 4, creatinine 0.89 Labs (7/24): K 3.8, creatinine 0.85  PMH: 1. HTN 2. Diabetes 3. Endometrial carcinoma: 8/17 diagnosis, had hysterectomy and BSO.  4. Hyperlipidemia 5. Breast cancer: Initial diagnosis in 2012.  Had left breast lumpectomy and radiation.  Recurrence in 7/20, ER+/PR-/HER2+.  She had chemotherapy with trastuzumab/pertuzumab/carboplatin/docetaxol x 6 starting 2/21.  With fall in EF in 4/21, Herceptin was held but later restarted.  Lumpectomy 6/21.  - Echo (1/21): EF 60-65%, GLS -20%, normal RV.  - Echo (4/21): EF 45-50%,  global hypokinesis, GLS -12.6%, mildly decreased RV function.  - Echo (6/21): EF 55-60%, GLS -14.4%, mild LVH, PASP 38, RV normal.  - Echo (7/21): EF 55-60%, mild LVH, GLS -16.9%, RV  Normal - Echo (9/21): EF 55-60%, grade 2 diastolic dysfunction, GLS -11.6% (poor endocardial tracking), normal RV, normal IVC.  - Echo (2/22): EF 55%, GLS -10.7% (poor endocardial tracking), normal RV.   - Echo (4/22): EF 50-55%, mild LVH, normal RV, GLS -14.3% but poor endocardial tracing. - Echo (7/22): EF 55-60%, mild LVH, mild LAE, GLS -20.1%, normal RV, PASP 32 mmHg.  - Echo (10/22): EF 55-60%, mild LVH, GLS -20.1%, normal RV.  - Echo (5/23): EF 55-60% with GLS -22.1%, normal RV.  - Echo (11/23): EF 55% on my read (reported 50-55%), GLS -19.8%, normal RV. - Echo (5/24): technically difficult study with inaccurate strain imaging due to poor endocardial tracing, EF 55%, normal RV, normal IVC.  - PYP scan (3/24): grade 2 with H/CL 1.08 (equivocal).  - cMRI (6/24) showed LVEF 53%, RVEF 52%, no evidence for cardiac amyloidosis - [facility] gene testing negative for hATTR. 6. Coronary calcium score scan (8/21): CAC 0 Agatston units.  - Coronary CTA (9/21): CAC 0 Agatston units.  No significant CAD.  7. DVT: Left leg, 8/21.  8. Cervical discectomy and fusion in 12/21.  9. Peripheral neuropathy 10. Carpal tunnel syndrome  Social History   Socioeconomic History   Marital status: Divorced    Spouse name: Not on file   Number of children: 0   Years of education: Not on file   Highest education level: Not on file  Occupational History   Occupation: Child psychotherapist    Employer: [employer]    Comment: retired  Tobacco Use   Smoking status: Never   Smokeless tobacco: Never  Building services engineer Use: Never used  Substance and Sexual Activity   Alcohol use: Not Currently    Comment: occasional on holidays   Drug use: Never   Sexual activity: Not Currently    Birth control/protection: Post-menopausal, Surgical  Other Topics Concern   Not on file  Social History Narrative   1 son deceased with homicide   Patient has been divorced twice   Right handed   Drinks caffeine   One story    Social Determinants of Health   Financial Resource Strain: Not on file  Food Insecurity: No Food Insecurity (11/23/2022)   Hunger Vital Sign    Worried About Running Out of Food in the Last Year:  Never true    Ran Out of Food in the Last Year: Never true  Transportation Needs: Unmet Transportation Needs (10/25/2020)   PRAPARE - Administrator, Civil Service (Medical): Yes    Lack of Transportation (Non-Medical): Yes  Physical Activity: Not on file  Stress: Not on file  Social Connections: Not on file  Intimate Partner Violence: Not on file   Family History  Problem Relation Age of Onset   Diabetes Mother    Hypertension Mother    Ovarian cancer Mother    Heart disease Father        COPD   Alcohol abuse Father        ETOH dependence   Breast cancer Maternal Aunt        dx in her 8s   Lung cancer Maternal Uncle    Breast cancer Paternal Aunt    Cancer Maternal Grandmother        salivary gland cancer  Colon cancer Neg Hx    Colon polyps Neg Hx    Esophageal cancer Neg Hx    Stomach cancer Neg Hx    Rectal cancer Neg Hx    ROS: All systems reviewed and negative except as per HPI.   Current Outpatient Medications  Medication Sig Dispense Refill   acetaminophen (TYLENOL) 500 MG tablet Take 500 mg by mouth every 6 (six) hours as needed for moderate pain.     Ascorbic Acid (VITAMIN C PO) Take 1 tablet by mouth daily.     carvedilol (COREG) 6.25 MG tablet Take 6.25 mg by mouth 2 (two) times daily.     Cholecalciferol (VITAMIN D3 PO) Take 1 capsule by mouth daily.     Cyanocobalamin (B-12 PO) Take 1 capsule by mouth daily.     furosemide (LASIX) 40 MG tablet Take 1.5 tablets (60 mg total) by mouth daily. 45 tablet 8   gabapentin (NEURONTIN) 300 MG capsule Take 600 mg by mouth at bedtime as needed (pain).     methylcellulose (CITRUCEL) oral powder Take 1 packet by mouth daily.     Multiple Vitamins-Minerals (MULTIVITAMIN WITH MINERALS) tablet Take 1 tablet by mouth daily.     potassium chloride SA (KLOR-CON M20) 20 MEQ tablet Take 1 tablet (20 mEq total) by mouth 2 (two) times daily. (Patient taking differently: Take 20 mEq by mouth daily.) 60 tablet 8   silver  sulfADIAZINE (SILVADENE) 1 % cream Apply 1 application  topically as needed (wound care).     telmisartan-hydrochlorothiazide (MICARDIS HCT) 80-12.5 MG tablet Take 1 tablet by mouth daily. 90 tablet 3   tirzepatide (MOUNJARO) 5 MG/0.5ML Pen Inject 5 mg into the skin once a week. 2 mL 0   XARELTO 20 MG TABS tablet TAKE 1 TABLET BY MOUTH DAILY WITH SUPPER. 30 tablet 11   No current facility-administered medications for this encounter.   Facility-Administered Medications Ordered in Other Encounters  Medication Dose Route Frequency Provider Last Rate Last Admin   cloNIDine (CATAPRES) tablet 0.1 mg  0.1 mg Oral Daily Tanner, Zenaida Niece E., PA-C   0.1 mg at 11/21/19 1742   heparin lock flush 100 unit/mL  500 Units Intracatheter Once [name], MD       Wt Readings from Last 3 Encounters:  04/27/23 108 kg (238 lb 3.2 oz)  04/21/23 108.9 kg (240 lb 1.6 oz)  04/01/23 105.6 kg (232 lb 12 oz)   BP 118/60   Pulse 70   Wt 108 kg (238 lb 3.2 oz)   LMP 05/18/2016   SpO2 99%   BMI 48.11 kg/m  Physical Exam General:  NAD. No resp difficulty, walked into clinic with cane, fatigued-appearing HEENT: Normal Neck: Supple. No JVD. Carotids 2+ bilat; no bruits. No lymphadenopathy or thryomegaly appreciated. Cor: PMI nondisplaced. Regular rate & rhythm. No rubs, gallops or murmurs. Lungs: Clear Abdomen: Soft, obese, nontender, nondistended. No hepatosplenomegaly. No bruits or masses. Good bowel sounds. Extremities: No cyanosis, clubbing, rash, trace pedal edema Neuro: Alert & oriented x 3, cranial nerves grossly intact. Moves all 4 extremities w/o difficulty. Affect pleasant.  Assessment/Plan: 1. Chemotherapy-related cardiomyopathy: Fall in EF and strain in the setting of Herceptin use.  Echo in 4/21 with EF 45-50% with GLS -12.6%.  Herceptin was held, Coreg started, and echo in 6/21 showed EF up to 55-60% with GLS -14.4% => Herceptin restarted.  Echo in 7/21 with EF 55-60%, GLS more negative at -16.9%.   Echo in 2/22 and in 9/21 showed normal EF  but less negative strain.  However, strain images did not appear to track well on either echo.  Echo in 4/22  showed EF 50-55%, again with poor strain tracking.  Echo in 7/22 and in 10/22 showed EF 55-60% with GLS -20.1% and mild LVH. Echo in 5/23 showed EF 55-60% with GLS -22.1%, normal RV.  Echo 11/23 was read as 50-55% but looked like 55% on Dr. Alford Highland read, with GLS -19.8% and normal RV. Echo 5/24 was a technically difficult study with EF 55% and inaccurate strain. NYHA class III symptoms, chronic pain and neuropathy contributory. She is not volume overloaded on exam.  - Continue Lasix 60 mg daily/20 KCL daily. [facility], K 3.8, SCr 0.85 - Continue Coreg 6.25 mg bid.  - Continue telmisartan.   - She should be safe to continue Herceptin.  With stable echoes and long-term Herceptin use, we can continue to obtain echoes at 6 month intervals.  - With painful peripheral neuropathy and carpal tunnel syndrome as well as LVH with diastolic CHF, there has  been concern of possible cardiac amyloidosis.  Urine and serum immunofixations negative.  PYP scan in 3/24 was equivocal.  TTR gene testing was negative. cMRI 6/24 showed LVEF 53% and no evidence of infiltrative process. We discussed these results today. 2. HTN: BP controlled.   3. DVT: on left.   - Continue Xarelto long-term given DVT in setting of malignancy.  4. Obesity: Difficult for her to exercise due to neuropathy, poor balance. Body mass index is 48.11 kg/m. - She is now on tirzepatide with weight trending down.  Follow up in 4-5 months with Dr. Shirlee Latch + surveillance echo  [facility] FNP-BC 04/27/2023
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
## 2023-04-28 ENCOUNTER — Other Ambulatory Visit: Payer: Self-pay

## 2023-04-29 ENCOUNTER — Encounter: Payer: Self-pay | Admitting: Internal Medicine

## 2023-04-29 DIAGNOSIS — H6122 Impacted cerumen, left ear: Secondary | ICD-10-CM | POA: Insufficient documentation

## 2023-04-29 DIAGNOSIS — H9202 Otalgia, left ear: Secondary | ICD-10-CM | POA: Insufficient documentation

## 2023-04-29 NOTE — Assessment & Plan Note (Addendum)
Age and sex appropriate education and counseling updated with regular exercise and diet Referrals for preventative services - none needed Immunizations addressed - declines covid booster, for shingrix at pharmacy Smoking counseling  - none needed Evidence for depression or other mood disorder - for counsleing referral Most recent labs reviewed. I have personally reviewed and have noted: 1) the patient's medical and social history 2) The patient's current medications and supplements 3) The patient's height, weight, and BMI have been recorded in the chart

## 2023-04-29 NOTE — Assessment & Plan Note (Signed)
Last vitamin D Lab Results  Component Value Date   VD25OH 37.66 07/10/2022   Low, to start oral replacement  

## 2023-04-29 NOTE — Assessment & Plan Note (Signed)
Ceruminosis is noted.  Wax is removed by syringing and manual debridement. Instructions for home care to prevent wax buildup are given.  

## 2023-04-29 NOTE — Assessment & Plan Note (Signed)
Chronic persistent with mild worsening, for referral counseling

## 2023-04-29 NOTE — Assessment & Plan Note (Signed)
For ENT referral for unexplained pain

## 2023-04-29 NOTE — Assessment & Plan Note (Signed)
Lab Results  Component Value Date   HGBA1C 6.1 02/15/2023   Stable, pt to restart mounjaro 2.5 mg weekly

## 2023-05-03 ENCOUNTER — Telehealth: Payer: Self-pay | Admitting: Pharmacist

## 2023-05-03 NOTE — Telephone Encounter (Signed)
Called patient to follow up with Mounjaro tolerability. Reports she never picked up Mounjaro 5mg  when I sent it in 6 weeks ago, she just got it this past weekend. She has been off the med for another month. States she just didn't follow up with the pharmacy to pick up the med.  This is at least the 3rd time she hasn't stayed on top of refills and has gone at least a month without the med. Emphasized that she cannot miss Mounjaro for long periods of time or else we need to start the dose titration all over again which we've had to do multiple times. She states she will try to be better about picking up the med from the pharmacy. I will call her in another month.

## 2023-05-13 ENCOUNTER — Inpatient Hospital Stay: Payer: Medicare Other

## 2023-05-13 VITALS — BP 117/68 | HR 71 | Temp 97.8°F | Resp 19 | Wt 238.5 lb

## 2023-05-13 DIAGNOSIS — C50812 Malignant neoplasm of overlapping sites of left female breast: Secondary | ICD-10-CM

## 2023-05-13 DIAGNOSIS — Z5112 Encounter for antineoplastic immunotherapy: Secondary | ICD-10-CM | POA: Diagnosis not present

## 2023-05-13 DIAGNOSIS — Z95828 Presence of other vascular implants and grafts: Secondary | ICD-10-CM

## 2023-05-13 DIAGNOSIS — Z171 Estrogen receptor negative status [ER-]: Secondary | ICD-10-CM

## 2023-05-13 LAB — CMP (CANCER CENTER ONLY)
ALT: 16 U/L (ref 0–44)
AST: 20 U/L (ref 15–41)
Albumin: 3.8 g/dL (ref 3.5–5.0)
Alkaline Phosphatase: 49 U/L (ref 38–126)
Anion gap: 6 (ref 5–15)
BUN: 36 mg/dL — ABNORMAL HIGH (ref 8–23)
CO2: 28 mmol/L (ref 22–32)
Calcium: 9.8 mg/dL (ref 8.9–10.3)
Chloride: 107 mmol/L (ref 98–111)
Creatinine: 1.05 mg/dL — ABNORMAL HIGH (ref 0.44–1.00)
GFR, Estimated: 59 mL/min — ABNORMAL LOW (ref >60)
Glucose, Bld: 91 mg/dL (ref 70–99)
Potassium: 3.8 mmol/L (ref 3.5–5.1)
Sodium: 141 mmol/L (ref 135–145)
Total Bilirubin: 0.3 mg/dL (ref 0.3–1.2)
Total Protein: 7.7 g/dL (ref 6.5–8.1)

## 2023-05-13 LAB — CBC WITH DIFFERENTIAL (CANCER CENTER ONLY)
Abs Immature Granulocytes: 0.02 10*3/uL (ref 0.00–0.07)
Basophils Absolute: 0 10*3/uL (ref 0.0–0.1)
Basophils Relative: 0 %
Eosinophils Absolute: 0.1 10*3/uL (ref 0.0–0.5)
Eosinophils Relative: 1 %
HCT: 32.1 % — ABNORMAL LOW (ref 36.0–46.0)
Hemoglobin: 10.2 g/dL — ABNORMAL LOW (ref 12.0–15.0)
Immature Granulocytes: 0 %
Lymphocytes Relative: 25 %
Lymphs Abs: 1.6 10*3/uL (ref 0.7–4.0)
MCH: 28.6 pg (ref 26.0–34.0)
MCHC: 31.8 g/dL (ref 30.0–36.0)
MCV: 89.9 fL (ref 80.0–100.0)
Monocytes Absolute: 0.8 10*3/uL (ref 0.1–1.0)
Monocytes Relative: 12 %
Neutro Abs: 3.9 10*3/uL (ref 1.7–7.7)
Neutrophils Relative %: 62 %
Platelet Count: 202 10*3/uL (ref 150–400)
RBC: 3.57 MIL/uL — ABNORMAL LOW (ref 3.87–5.11)
RDW: 14.6 % (ref 11.5–15.5)
WBC Count: 6.4 10*3/uL (ref 4.0–10.5)
nRBC: 0 % (ref 0.0–0.2)

## 2023-05-13 MED ORDER — SODIUM CHLORIDE 0.9% FLUSH
10.0000 mL | INTRAVENOUS | Status: DC | PRN
  Administered 2023-05-13: 10 mL

## 2023-05-13 MED ORDER — TRASTUZUMAB-ANNS CHEMO 150 MG IV SOLR
6.0000 mg/kg | Freq: Once | INTRAVENOUS | Status: AC
  Administered 2023-05-13: 600 mg via INTRAVENOUS
  Filled 2023-05-13: qty 28.57

## 2023-05-13 MED ORDER — ACETAMINOPHEN 325 MG PO TABS
650.0000 mg | ORAL_TABLET | Freq: Once | ORAL | Status: AC
  Administered 2023-05-13: 650 mg via ORAL
  Filled 2023-05-13: qty 2

## 2023-05-13 MED ORDER — HEPARIN SOD (PORK) LOCK FLUSH 100 UNIT/ML IV SOLN
500.0000 [IU] | Freq: Once | INTRAVENOUS | Status: AC | PRN
  Administered 2023-05-13: 500 [IU]

## 2023-05-13 MED ORDER — DIPHENHYDRAMINE HCL 25 MG PO CAPS
25.0000 mg | ORAL_CAPSULE | Freq: Once | ORAL | Status: AC
  Administered 2023-05-13: 25 mg via ORAL
  Filled 2023-05-13: qty 1

## 2023-05-13 MED ORDER — SODIUM CHLORIDE 0.9% FLUSH
10.0000 mL | Freq: Once | INTRAVENOUS | Status: AC
  Administered 2023-05-13: 10 mL

## 2023-05-13 MED ORDER — SODIUM CHLORIDE 0.9 % IV SOLN: Freq: Once | INTRAVENOUS | Status: AC

## 2023-05-13 NOTE — Patient Instructions (Signed)
[facility]  Discharge Instructions: Thank you for choosing [facility] to provide your oncology and hematology care.   If you have a lab appointment with the Cancer Center, please go directly to the Cancer Center and check in at the registration area.   Wear comfortable clothing and clothing appropriate for easy access to any Portacath or PICC line.   We strive to give you quality time with your provider. You may need to reschedule your appointment if you arrive late (15 or more minutes).  Arriving late affects you and other patients whose appointments are after yours.  Also, if you miss three or more appointments without notifying the office, you may be dismissed from the clinic at the provider's discretion.      For prescription refill requests, have your pharmacy contact our office and allow 72 hours for refills to be completed.    Today you received the following chemotherapy and/or immunotherapy agents: Trastuzumab      To help prevent nausea and vomiting after your treatment, we encourage you to take your nausea medication as directed.  BELOW ARE SYMPTOMS THAT SHOULD BE REPORTED IMMEDIATELY: *FEVER GREATER THAN 100.4 F (38 C) OR HIGHER *CHILLS OR SWEATING *NAUSEA AND VOMITING THAT IS NOT CONTROLLED WITH YOUR NAUSEA MEDICATION *UNUSUAL SHORTNESS OF BREATH *UNUSUAL BRUISING OR BLEEDING *URINARY PROBLEMS (pain or burning when urinating, or frequent urination) *BOWEL PROBLEMS (unusual diarrhea, constipation, pain near the anus) TENDERNESS IN MOUTH AND THROAT WITH OR WITHOUT PRESENCE OF ULCERS (sore throat, sores in mouth, or a toothache) UNUSUAL RASH, SWELLING OR PAIN  UNUSUAL VAGINAL DISCHARGE OR ITCHING   Items with * indicate a potential emergency and should be followed up as soon as possible or go to the Emergency Department if any problems should occur.  Please show the CHEMOTHERAPY ALERT CARD or IMMUNOTHERAPY ALERT CARD at  check-in to the Emergency Department and triage nurse.  Should you have questions after your visit or need to cancel or reschedule your appointment, please contact [facility]  Dept: [phone]  and follow the prompts.  Office hours are 8:00 a.m. to 4:30 p.m. Monday - Friday. Please note that voicemails left after 4:00 p.m. may not be returned until the following business day.  We are closed weekends and major holidays. You have access to a nurse at all times for urgent questions. Please call the main number to the clinic Dept: [phone] and follow the prompts.   For any non-urgent questions, you may also contact your provider using MyChart. We now offer e-Visits for anyone 18 and older to request care online for non-urgent symptoms. For details visit [email or web address].   Also download the MyChart app! Go to the app store, search "MyChart", open the app, select [facility], and log in with your MyChart username and password.  

## 2023-05-21 ENCOUNTER — Encounter (HOSPITAL_COMMUNITY)
Admission: RE | Admit: 2023-05-21 | Discharge: 2023-05-21 | Disposition: A | Discharge status: Home / Self Care | Payer: Medicare Other | Source: Ambulatory Visit | Attending: Hematology and Oncology | Admitting: Hematology and Oncology

## 2023-05-21 DIAGNOSIS — C50812 Malignant neoplasm of overlapping sites of left female breast: Secondary | ICD-10-CM | POA: Diagnosis present

## 2023-05-21 DIAGNOSIS — Z171 Estrogen receptor negative status [ER-]: Secondary | ICD-10-CM | POA: Insufficient documentation

## 2023-05-21 LAB — GLUCOSE, CAPILLARY: Glucose-Capillary: 98 mg/dL (ref 70–99)

## 2023-05-21 MED ORDER — FLUDEOXYGLUCOSE F - 18 (FDG) INJECTION
11.9000 | Freq: Once | INTRAVENOUS | Status: AC
  Administered 2023-05-21: 11.9 via INTRAVENOUS

## 2023-05-21 MED ORDER — HEPARIN SOD (PORK) LOCK FLUSH 100 UNIT/ML IV SOLN
500.0000 [IU] | Freq: Once | INTRAVENOUS | Status: AC
  Administered 2023-05-21: 500 [IU] via INTRAVENOUS
  Filled 2023-05-21: qty 5

## 2023-06-01 ENCOUNTER — Telehealth: Payer: Self-pay | Admitting: Pharmacist

## 2023-06-01 MED ORDER — MOUNJARO 5 MG/0.5ML ~~LOC~~ SOAJ: 5.0000 mg | SUBCUTANEOUS | 0 refills | Status: DC

## 2023-06-01 NOTE — Telephone Encounter (Signed)
Called pt to follow up with her Mounjaro, having some diarrhea but this her baseline, didn't worsen on med. Has given 3 doses of the 5mg  so far. She wants to stay on this dose for another month, then increase to the 7.5mg . Refill sent in.

## 2023-06-03 ENCOUNTER — Inpatient Hospital Stay (HOSPITAL_BASED_OUTPATIENT_CLINIC_OR_DEPARTMENT_OTHER): Payer: Medicare Other | Admitting: Adult Health

## 2023-06-03 ENCOUNTER — Encounter: Payer: Self-pay | Admitting: Adult Health

## 2023-06-03 ENCOUNTER — Inpatient Hospital Stay: Payer: Medicare Other

## 2023-06-03 ENCOUNTER — Inpatient Hospital Stay: Payer: Medicare Other | Attending: Hematology and Oncology

## 2023-06-03 VITALS — BP 123/62 | HR 69 | Temp 98.0°F | Resp 18 | Ht 59.0 in | Wt 235.2 lb

## 2023-06-03 DIAGNOSIS — Z9079 Acquired absence of other genital organ(s): Secondary | ICD-10-CM | POA: Insufficient documentation

## 2023-06-03 DIAGNOSIS — Z90722 Acquired absence of ovaries, bilateral: Secondary | ICD-10-CM | POA: Diagnosis not present

## 2023-06-03 DIAGNOSIS — Z5112 Encounter for antineoplastic immunotherapy: Secondary | ICD-10-CM | POA: Insufficient documentation

## 2023-06-03 DIAGNOSIS — C50812 Malignant neoplasm of overlapping sites of left female breast: Secondary | ICD-10-CM

## 2023-06-03 DIAGNOSIS — C50112 Malignant neoplasm of central portion of left female breast: Secondary | ICD-10-CM | POA: Diagnosis not present

## 2023-06-03 DIAGNOSIS — Z171 Estrogen receptor negative status [ER-]: Secondary | ICD-10-CM

## 2023-06-03 DIAGNOSIS — Z90710 Acquired absence of both cervix and uterus: Secondary | ICD-10-CM | POA: Diagnosis not present

## 2023-06-03 DIAGNOSIS — C773 Secondary and unspecified malignant neoplasm of axilla and upper limb lymph nodes: Secondary | ICD-10-CM | POA: Diagnosis present

## 2023-06-03 DIAGNOSIS — Z8542 Personal history of malignant neoplasm of other parts of uterus: Secondary | ICD-10-CM | POA: Diagnosis not present

## 2023-06-03 DIAGNOSIS — Z95828 Presence of other vascular implants and grafts: Secondary | ICD-10-CM

## 2023-06-03 LAB — CBC WITH DIFFERENTIAL (CANCER CENTER ONLY)
Abs Immature Granulocytes: 0.01 10*3/uL (ref 0.00–0.07)
Basophils Absolute: 0 10*3/uL (ref 0.0–0.1)
Basophils Relative: 1 %
Eosinophils Absolute: 0.1 10*3/uL (ref 0.0–0.5)
Eosinophils Relative: 2 %
HCT: 32 % — ABNORMAL LOW (ref 36.0–46.0)
Hemoglobin: 10.4 g/dL — ABNORMAL LOW (ref 12.0–15.0)
Immature Granulocytes: 0 %
Lymphocytes Relative: 29 %
Lymphs Abs: 1.5 10*3/uL (ref 0.7–4.0)
MCH: 28.9 pg (ref 26.0–34.0)
MCHC: 32.5 g/dL (ref 30.0–36.0)
MCV: 88.9 fL (ref 80.0–100.0)
Monocytes Absolute: 0.7 10*3/uL (ref 0.1–1.0)
Monocytes Relative: 13 %
Neutro Abs: 3 10*3/uL (ref 1.7–7.7)
Neutrophils Relative %: 55 %
Platelet Count: 205 10*3/uL (ref 150–400)
RBC: 3.6 MIL/uL — ABNORMAL LOW (ref 3.87–5.11)
RDW: 14.1 % (ref 11.5–15.5)
WBC Count: 5.3 10*3/uL (ref 4.0–10.5)
nRBC: 0 % (ref 0.0–0.2)

## 2023-06-03 LAB — CMP (CANCER CENTER ONLY)
ALT: 13 U/L (ref 0–44)
AST: 18 U/L (ref 15–41)
Albumin: 3.8 g/dL (ref 3.5–5.0)
Alkaline Phosphatase: 47 U/L (ref 38–126)
Anion gap: 6 (ref 5–15)
BUN: 33 mg/dL — ABNORMAL HIGH (ref 8–23)
CO2: 28 mmol/L (ref 22–32)
Calcium: 9.1 mg/dL (ref 8.9–10.3)
Chloride: 104 mmol/L (ref 98–111)
Creatinine: 1.1 mg/dL — ABNORMAL HIGH (ref 0.44–1.00)
GFR, Estimated: 56 mL/min — ABNORMAL LOW (ref >60)
Glucose, Bld: 88 mg/dL (ref 70–99)
Potassium: 3.7 mmol/L (ref 3.5–5.1)
Sodium: 138 mmol/L (ref 135–145)
Total Bilirubin: 0.3 mg/dL (ref 0.3–1.2)
Total Protein: 7.9 g/dL (ref 6.5–8.1)

## 2023-06-03 MED ORDER — SODIUM CHLORIDE 0.9% FLUSH
10.0000 mL | Freq: Once | INTRAVENOUS | Status: AC
  Administered 2023-06-03: 10 mL

## 2023-06-03 MED ORDER — SODIUM CHLORIDE 0.9% FLUSH
10.0000 mL | INTRAVENOUS | Status: DC | PRN
  Administered 2023-06-03: 10 mL

## 2023-06-03 MED ORDER — ACETAMINOPHEN 325 MG PO TABS
650.0000 mg | ORAL_TABLET | Freq: Once | ORAL | Status: AC
  Administered 2023-06-03: 650 mg via ORAL
  Filled 2023-06-03: qty 2

## 2023-06-03 MED ORDER — DIPHENHYDRAMINE HCL 25 MG PO CAPS
25.0000 mg | ORAL_CAPSULE | Freq: Once | ORAL | Status: AC
  Administered 2023-06-03: 25 mg via ORAL
  Filled 2023-06-03: qty 1

## 2023-06-03 MED ORDER — SODIUM CHLORIDE 0.9 % IV SOLN: Freq: Once | INTRAVENOUS | Status: AC

## 2023-06-03 MED ORDER — HEPARIN SOD (PORK) LOCK FLUSH 100 UNIT/ML IV SOLN
500.0000 [IU] | Freq: Once | INTRAVENOUS | Status: AC | PRN
  Administered 2023-06-03: 500 [IU]

## 2023-06-03 MED ORDER — TRASTUZUMAB-ANNS CHEMO 150 MG IV SOLR
6.0000 mg/kg | Freq: Once | INTRAVENOUS | Status: AC
  Administered 2023-06-03: 600 mg via INTRAVENOUS
  Filled 2023-06-03: qty 28.57

## 2023-06-03 NOTE — Assessment & Plan Note (Signed)
[name] is a 67 year old woman with stage IV breast cancer currently on Herceptin every 3 weeks.  She continues on this with good tolerance and her most recent echocardiogram demonstrated no evidence of recurrent disease.  [name] has no clinical signs of breast cancer progression at today's visit.  She will continue to follow-up with cardiology about her echocardiograms and heart medications.  I reviewed with her that the room spinning sensation is likely vertigo and if it recurs we are happy to refer her to vestibular rehab.  [name] will return in 3 weeks for Herceptin and in 6 weeks for follow-up with Dr. Al Pimple and Herceptin.

## 2023-06-03 NOTE — Patient Instructions (Signed)
[facility]  Discharge Instructions: Thank you for choosing [facility] to provide your oncology and hematology care.   If you have a lab appointment with the Cancer Center, please go directly to the Cancer Center and check in at the registration area.   Wear comfortable clothing and clothing appropriate for easy access to any Portacath or PICC line.   We strive to give you quality time with your provider. You may need to reschedule your appointment if you arrive late (15 or more minutes).  Arriving late affects you and other patients whose appointments are after yours.  Also, if you miss three or more appointments without notifying the office, you may be dismissed from the clinic at the provider's discretion.      For prescription refill requests, have your pharmacy contact our office and allow 72 hours for refills to be completed.    Today you received the following chemotherapy and/or immunotherapy agents: Trastuzumab      To help prevent nausea and vomiting after your treatment, we encourage you to take your nausea medication as directed.  BELOW ARE SYMPTOMS THAT SHOULD BE REPORTED IMMEDIATELY: *FEVER GREATER THAN 100.4 F (38 C) OR HIGHER *CHILLS OR SWEATING *NAUSEA AND VOMITING THAT IS NOT CONTROLLED WITH YOUR NAUSEA MEDICATION *UNUSUAL SHORTNESS OF BREATH *UNUSUAL BRUISING OR BLEEDING *URINARY PROBLEMS (pain or burning when urinating, or frequent urination) *BOWEL PROBLEMS (unusual diarrhea, constipation, pain near the anus) TENDERNESS IN MOUTH AND THROAT WITH OR WITHOUT PRESENCE OF ULCERS (sore throat, sores in mouth, or a toothache) UNUSUAL RASH, SWELLING OR PAIN  UNUSUAL VAGINAL DISCHARGE OR ITCHING   Items with * indicate a potential emergency and should be followed up as soon as possible or go to the Emergency Department if any problems should occur.  Please show the CHEMOTHERAPY ALERT CARD or IMMUNOTHERAPY ALERT CARD at  check-in to the Emergency Department and triage nurse.  Should you have questions after your visit or need to cancel or reschedule your appointment, please contact [facility]  Dept: [phone]  and follow the prompts.  Office hours are 8:00 a.m. to 4:30 p.m. Monday - Friday. Please note that voicemails left after 4:00 p.m. may not be returned until the following business day.  We are closed weekends and major holidays. You have access to a nurse at all times for urgent questions. Please call the main number to the clinic Dept: [phone] and follow the prompts.   For any non-urgent questions, you may also contact your provider using MyChart. We now offer e-Visits for anyone 18 and older to request care online for non-urgent symptoms. For details visit [email or web address].   Also download the MyChart app! Go to the app store, search "MyChart", open the app, select [facility], and log in with your MyChart username and password.  

## 2023-06-03 NOTE — Progress Notes (Signed)
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 disease Father        COPD   Alcohol abuse Father        ETOH dependence   Breast cancer Maternal Aunt        dx in her 52s   Lung cancer Maternal Uncle    Breast cancer Paternal Aunt    Cancer Maternal Grandmother        salivary gland cancer   Colon cancer Neg Hx    Colon polyps Neg Hx    Esophageal cancer Neg Hx    Stomach cancer Neg Hx  Rectal cancer Neg Hx     Review of Systems  Constitutional:  Negative for appetite change, chills, fatigue, fever and unexpected weight change.  HENT:   Negative for hearing loss, lump/mass and trouble swallowing.   Eyes:  Negative for eye problems and icterus.  Respiratory:  Negative for chest tightness, cough and shortness of breath.   Cardiovascular:  Negative for chest pain, leg swelling and palpitations.  Gastrointestinal:  Negative for abdominal distention, abdominal pain, constipation, diarrhea, nausea and  vomiting.  Endocrine: Negative for hot flashes.  Genitourinary:  Negative for difficulty urinating.   Musculoskeletal:  Negative for arthralgias.  Skin:  Negative for itching and rash.  Neurological:  Negative for dizziness, extremity weakness, headaches and numbness.  Hematological:  Negative for adenopathy. Does not bruise/bleed easily.  Psychiatric/Behavioral:  Negative for depression. The patient is not nervous/anxious.       PHYSICAL EXAMINATION   Onc Performance Status - 06/03/23 1351       ECOG Perf Status   ECOG Perf Status Ambulatory and capable of all selfcare but unable to carry out any work activities.  Up and about more than 50% of waking hours      KPS SCALE   KPS % SCORE Normal activity with effort, some s/s of disease             Vitals:   06/03/23 1351  BP: 123/62  Pulse: 69  Resp: 18  Temp: 98 F (36.7 C)  SpO2: 100%    Physical Exam Constitutional:      General: She is not in acute distress.    Appearance: Normal appearance. She is not toxic-appearing.  HENT:     Head: Normocephalic and atraumatic.     Mouth/Throat:     Mouth: Mucous membranes are moist.     Pharynx: Oropharynx is clear. No oropharyngeal exudate or posterior oropharyngeal erythema.  Eyes:     General: No scleral icterus. Cardiovascular:     Rate and Rhythm: Normal rate and regular rhythm.     Pulses: Normal pulses.     Heart sounds: Normal heart sounds.  Pulmonary:     Effort: Pulmonary effort is normal.     Breath sounds: Normal breath sounds.  Abdominal:     General: Abdomen is flat. Bowel sounds are normal. There is no distension.     Palpations: Abdomen is soft.     Tenderness: There is no abdominal tenderness.  Musculoskeletal:        General: No swelling.     Cervical back: Neck supple.  Lymphadenopathy:     Cervical: No cervical adenopathy.  Skin:    General: Skin is warm and dry.     Findings: No rash.  Neurological:     General: No focal deficit present.      Mental Status: She is alert.  Psychiatric:        Mood and Affect: Mood normal.        Behavior: Behavior normal.     LABORATORY DATA:  CBC    Component Value Date/Time   WBC 5.3 06/03/2023 1322   WBC 6.4 09/27/2020 1114   RBC 3.60 (L) 06/03/2023 1322   HGB 10.4 (L) 06/03/2023 1322   HGB 11.9 03/30/2017 1044   HCT 32.0 (L) 06/03/2023 1322   HCT 37.4 03/30/2017 1044   PLT 205 06/03/2023 1322   PLT 238 03/30/2017 1044   MCV 88.9 06/03/2023 1322   MCV 85.0 03/30/2017 1044   MCH 28.9 06/03/2023 1322  MCHC 32.5 06/03/2023 1322   RDW 14.1 06/03/2023 1322   RDW 15.1 (H) 03/30/2017 1044   LYMPHSABS 1.5 06/03/2023 1322   LYMPHSABS 1.3 03/30/2017 1044   MONOABS 0.7 06/03/2023 1322   MONOABS 0.5 03/30/2017 1044   EOSABS 0.1 06/03/2023 1322   EOSABS 0.0 03/30/2017 1044   BASOSABS 0.0 06/03/2023 1322   BASOSABS 0.0 03/30/2017 1044    CMP     Component Value Date/Time   NA 138 06/03/2023 1322   NA 143 03/30/2017 1044   K 3.7 06/03/2023 1322   K 3.8 03/30/2017 1044   CL 104 06/03/2023 1322   CL 106 01/18/2013 0909   CO2 28 06/03/2023 1322   CO2 27 03/30/2017 1044   GLUCOSE 88 06/03/2023 1322   GLUCOSE 91 03/30/2017 1044   GLUCOSE 99 01/18/2013 0909   BUN 33 (H) 06/03/2023 1322   BUN 9.7 03/30/2017 1044   CREATININE 1.10 (H) 06/03/2023 1322   CREATININE 0.8 03/30/2017 1044   CALCIUM 9.1 06/03/2023 1322   CALCIUM 9.7 03/30/2017 1044   PROT 7.9 06/03/2023 1322   PROT 8.0 03/30/2017 1044   ALBUMIN 3.8 06/03/2023 1322   ALBUMIN 3.8 01/27/2021 1232   ALBUMIN 3.4 (L) 03/30/2017 1044   AST 18 06/03/2023 1322   AST 17 03/30/2017 1044   ALT 13 06/03/2023 1322   ALT 15 03/30/2017 1044   ALKPHOS 47 06/03/2023 1322   ALKPHOS 60 03/30/2017 1044   BILITOT 0.3 06/03/2023 1322   BILITOT 0.34 03/30/2017 1044   GFRNONAA 56 (L) 06/03/2023 1322   GFRAA >60 07/11/2020 1610       PENDING LABS:   RADIOGRAPHIC STUDIES:  No results  found.   PATHOLOGY:     ASSESSMENT and THERAPY PLAN:   Malignant neoplasm of central portion of left breast in female, estrogen receptor negative (HCC) [name] is a 67 year old woman with stage IV breast cancer currently on Herceptin every 3 weeks.  She continues on this with good tolerance and her most recent echocardiogram demonstrated no evidence of recurrent disease.  [name] has no clinical signs of breast cancer progression at today's visit.  She will continue to follow-up with cardiology about her echocardiograms and heart medications.  I reviewed with her that the room spinning sensation is likely vertigo and if it recurs we are happy to refer her to vestibular rehab.  [name] will return in 3 weeks for Herceptin and in 6 weeks for follow-up with Dr. Al Pimple and Herceptin.  All questions were answered. The patient knows to call the clinic with any problems, questions or concerns. We can certainly see the patient much sooner if necessary.  Total encounter time:30 minutes*in face-to-face visit time, chart review, lab review, care coordination, order entry, and documentation of the encounter time.    Lillard Anes, NP 06/03/23 3:22 PM Medical Oncology and Hematology [facility] [address] [place], [place] [address] Tel. [phone]    Fax. [phone]  *Total Encounter Time as defined by the Centers for Medicare and Medicaid Services includes, in addition to the face-to-face time of a patient visit (documented in the note above) non-face-to-face time: obtaining and reviewing outside history, ordering and reviewing medications, tests or procedures, care coordination (communications with other health care professionals or caregivers) and documentation in the medical record.

## 2023-06-07 ENCOUNTER — Encounter (INDEPENDENT_AMBULATORY_CARE_PROVIDER_SITE_OTHER): Payer: Self-pay | Admitting: Otolaryngology

## 2023-06-07 ENCOUNTER — Encounter: Payer: Self-pay | Admitting: Hematology and Oncology

## 2023-06-07 ENCOUNTER — Ambulatory Visit (INDEPENDENT_AMBULATORY_CARE_PROVIDER_SITE_OTHER): Payer: Medicare Other | Admitting: Otolaryngology

## 2023-06-07 VITALS — BP 131/84 | HR 67 | Ht 59.0 in | Wt 235.0 lb

## 2023-06-07 DIAGNOSIS — R0981 Nasal congestion: Secondary | ICD-10-CM

## 2023-06-07 DIAGNOSIS — H6993 Unspecified Eustachian tube disorder, bilateral: Secondary | ICD-10-CM

## 2023-06-07 DIAGNOSIS — J3089 Other allergic rhinitis: Secondary | ICD-10-CM

## 2023-06-07 DIAGNOSIS — H8110 Benign paroxysmal vertigo, unspecified ear: Secondary | ICD-10-CM | POA: Diagnosis not present

## 2023-06-07 DIAGNOSIS — G4733 Obstructive sleep apnea (adult) (pediatric): Secondary | ICD-10-CM | POA: Diagnosis not present

## 2023-06-07 DIAGNOSIS — H9190 Unspecified hearing loss, unspecified ear: Secondary | ICD-10-CM

## 2023-06-07 DIAGNOSIS — J343 Hypertrophy of nasal turbinates: Secondary | ICD-10-CM

## 2023-06-07 DIAGNOSIS — R42 Dizziness and giddiness: Secondary | ICD-10-CM

## 2023-06-07 MED ORDER — DESLORATADINE 5 MG PO TABS: 5.0000 mg | ORAL_TABLET | Freq: Every day | ORAL | 3 refills | Status: DC

## 2023-06-07 MED ORDER — FLUTICASONE PROPIONATE 50 MCG/ACT NA SUSP: 2.0000 | Freq: Every day | NASAL | 6 refills | Status: DC
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
## 2023-06-07 NOTE — Patient Instructions (Addendum)
-   schedule vestibular therapy and audiogram - Flonase and Clarinex - we will check the insides of your nose when you return - return after testing

## 2023-06-15 ENCOUNTER — Ambulatory Visit: Payer: Medicare Other

## 2023-06-15 VITALS — Ht 59.0 in | Wt 235.0 lb

## 2023-06-15 DIAGNOSIS — Z0000 Encounter for general adult medical examination without abnormal findings: Secondary | ICD-10-CM

## 2023-06-15 NOTE — Patient Instructions (Addendum)
Ms. Rodda , Thank you for taking time to come for your Medicare Wellness Visit. I appreciate your ongoing commitment to your health goals. Please review the following plan we discussed and let me know if I can assist you in the future.   Referrals/Orders/Follow-Ups/Clinician Recommendations: No  This is a list of the screening recommended for you and due dates:  Health Maintenance  Topic Date Due   Zoster (Shingles) Vaccine (1 of 2) Never done   COVID-19 Vaccine (5 - 2023-24 season) 04/14/2023   Flu Shot  05/20/2023   Eye exam for diabetics  06/02/2023   Yearly kidney health urinalysis for diabetes  07/11/2023   Complete foot exam   07/11/2023   Hemoglobin A1C  08/17/2023   Yearly kidney function blood test for diabetes  06/02/2024   Medicare Annual Wellness Visit  06/14/2024   Mammogram  11/23/2024   DTaP/Tdap/Td vaccine (4 - Td or Tdap) 07/10/2032   Colon Cancer Screening  09/21/2032   Hepatitis C Screening  Completed   HIV Screening  Completed   HPV Vaccine  Aged Out    Advanced directives: (In Chart) A copy of your advanced directives are scanned into your chart should your provider ever need it.  Next Medicare Annual Wellness Visit scheduled for next year: No  Women Preventive Care attachment FALL PREVENTION attachment
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
## 2023-06-16 ENCOUNTER — Other Ambulatory Visit: Payer: Self-pay

## 2023-06-25 ENCOUNTER — Inpatient Hospital Stay (HOSPITAL_BASED_OUTPATIENT_CLINIC_OR_DEPARTMENT_OTHER): Payer: Medicare Other

## 2023-06-25 ENCOUNTER — Inpatient Hospital Stay: Payer: Medicare Other | Attending: Hematology and Oncology

## 2023-06-25 VITALS — BP 125/81 | HR 68 | Temp 97.8°F | Resp 18 | Wt 238.8 lb

## 2023-06-25 DIAGNOSIS — C773 Secondary and unspecified malignant neoplasm of axilla and upper limb lymph nodes: Secondary | ICD-10-CM | POA: Insufficient documentation

## 2023-06-25 DIAGNOSIS — Z171 Estrogen receptor negative status [ER-]: Secondary | ICD-10-CM

## 2023-06-25 DIAGNOSIS — C50812 Malignant neoplasm of overlapping sites of left female breast: Secondary | ICD-10-CM | POA: Diagnosis present

## 2023-06-25 DIAGNOSIS — C50112 Malignant neoplasm of central portion of left female breast: Secondary | ICD-10-CM

## 2023-06-25 DIAGNOSIS — Z9079 Acquired absence of other genital organ(s): Secondary | ICD-10-CM | POA: Insufficient documentation

## 2023-06-25 DIAGNOSIS — Z8542 Personal history of malignant neoplasm of other parts of uterus: Secondary | ICD-10-CM | POA: Diagnosis not present

## 2023-06-25 DIAGNOSIS — Z90722 Acquired absence of ovaries, bilateral: Secondary | ICD-10-CM | POA: Insufficient documentation

## 2023-06-25 DIAGNOSIS — Z5112 Encounter for antineoplastic immunotherapy: Secondary | ICD-10-CM | POA: Diagnosis present

## 2023-06-25 DIAGNOSIS — Z90710 Acquired absence of both cervix and uterus: Secondary | ICD-10-CM | POA: Insufficient documentation

## 2023-06-25 LAB — CBC WITH DIFFERENTIAL (CANCER CENTER ONLY)
Abs Immature Granulocytes: 0.03 10*3/uL (ref 0.00–0.07)
Basophils Absolute: 0 10*3/uL (ref 0.0–0.1)
Basophils Relative: 0 %
Eosinophils Absolute: 0.1 10*3/uL (ref 0.0–0.5)
Eosinophils Relative: 2 %
HCT: 31 % — ABNORMAL LOW (ref 36.0–46.0)
Hemoglobin: 10.1 g/dL — ABNORMAL LOW (ref 12.0–15.0)
Immature Granulocytes: 1 %
Lymphocytes Relative: 28 %
Lymphs Abs: 1.6 10*3/uL (ref 0.7–4.0)
MCH: 29.1 pg (ref 26.0–34.0)
MCHC: 32.6 g/dL (ref 30.0–36.0)
MCV: 89.3 fL (ref 80.0–100.0)
Monocytes Absolute: 0.6 10*3/uL (ref 0.1–1.0)
Monocytes Relative: 10 %
Neutro Abs: 3.2 10*3/uL (ref 1.7–7.7)
Neutrophils Relative %: 59 %
Platelet Count: 205 10*3/uL (ref 150–400)
RBC: 3.47 MIL/uL — ABNORMAL LOW (ref 3.87–5.11)
RDW: 14.1 % (ref 11.5–15.5)
WBC Count: 5.5 10*3/uL (ref 4.0–10.5)
nRBC: 0 % (ref 0.0–0.2)

## 2023-06-25 LAB — CMP (CANCER CENTER ONLY)
ALT: 12 U/L (ref 0–44)
AST: 16 U/L (ref 15–41)
Albumin: 3.7 g/dL (ref 3.5–5.0)
Alkaline Phosphatase: 45 U/L (ref 38–126)
Anion gap: 5 (ref 5–15)
BUN: 23 mg/dL (ref 8–23)
CO2: 28 mmol/L (ref 22–32)
Calcium: 9.2 mg/dL (ref 8.9–10.3)
Chloride: 107 mmol/L (ref 98–111)
Creatinine: 0.97 mg/dL (ref 0.44–1.00)
GFR, Estimated: >60 mL/min (ref >60)
Glucose, Bld: 86 mg/dL (ref 70–99)
Potassium: 3.7 mmol/L (ref 3.5–5.1)
Sodium: 140 mmol/L (ref 135–145)
Total Bilirubin: 0.4 mg/dL (ref 0.3–1.2)
Total Protein: 7.9 g/dL (ref 6.5–8.1)

## 2023-06-25 MED ORDER — SODIUM CHLORIDE 0.9 % IV SOLN: Freq: Once | INTRAVENOUS | Status: AC

## 2023-06-25 MED ORDER — ACETAMINOPHEN 325 MG PO TABS
650.0000 mg | ORAL_TABLET | Freq: Once | ORAL | Status: AC
  Administered 2023-06-25: 650 mg via ORAL
  Filled 2023-06-25: qty 2

## 2023-06-25 MED ORDER — TRASTUZUMAB-ANNS CHEMO 150 MG IV SOLR
6.0000 mg/kg | Freq: Once | INTRAVENOUS | Status: AC
  Administered 2023-06-25: 600 mg via INTRAVENOUS
  Filled 2023-06-25: qty 28.57

## 2023-06-25 MED ORDER — SODIUM CHLORIDE 0.9% FLUSH
10.0000 mL | INTRAVENOUS | Status: DC | PRN
  Administered 2023-06-25: 10 mL

## 2023-06-25 MED ORDER — HEPARIN SOD (PORK) LOCK FLUSH 100 UNIT/ML IV SOLN
500.0000 [IU] | Freq: Once | INTRAVENOUS | Status: AC | PRN
  Administered 2023-06-25: 500 [IU]

## 2023-06-25 MED ORDER — DIPHENHYDRAMINE HCL 25 MG PO CAPS
25.0000 mg | ORAL_CAPSULE | Freq: Once | ORAL | Status: AC
  Administered 2023-06-25: 25 mg via ORAL
  Filled 2023-06-25: qty 1

## 2023-06-25 NOTE — Progress Notes (Signed)
Per Dr. Al Pimple- patient is a six month ECHO check up. Ok to treat today. No changes today- is pursuing treatment for ongoing vertigo issues noted in [place] causey note 06/03/23.

## 2023-06-29 MED ORDER — MOUNJARO 7.5 MG/0.5ML ~~LOC~~ SOAJ: 7.5000 mg | SUBCUTANEOUS | 0 refills | Status: DC

## 2023-06-29 NOTE — Addendum Note (Signed)
Addended by: Rochella Benner E on: 06/29/2023 09:29 AM   Modules accepted: Orders

## 2023-06-29 NOTE — Telephone Encounter (Signed)
Next rx for 7.5mg  dose sent in per last discussion.

## 2023-06-30 ENCOUNTER — Ambulatory Visit: Payer: Medicare Other | Attending: Otolaryngology | Admitting: Audiologist

## 2023-06-30 DIAGNOSIS — H9193 Unspecified hearing loss, bilateral: Secondary | ICD-10-CM | POA: Diagnosis present

## 2023-06-30 NOTE — Procedures (Signed)
  Outpatient Audiology and [facility] [address] [place], [place]  [address] [phone]  AUDIOLOGICAL  EVALUATION  NAME: [name]     DOB:   [birth year]      MRN: [id number]                                                                                     DATE: 06/30/2023     REFERENT: [name], MD STATUS: Outpatient DIAGNOSIS: Vertigo, Decreased Hearing   History: [name] was seen for an audiological evaluation due to concern that she cannot sense people behind her. She cannot tell when people are standing directly behind her and she wonders if its due to hearing loss. In August she had a bought of vertigo last a few minutes that left her nauseous. That's the only time she has experiences vertigo. Wax builds up in her right ear. She often sticks things like bobby pins or qtips to try and itch the ear and clear it out. Sometimes it feels like something is crawling in her ear canal. No other case history reported.   Evaluation:  Otoscopy showed a clear view of the tympanic membranes, bilaterally Tympanometry results were consistent with normal middle ear function Audiometric testing was completed using Conventional Audiometry techniques with insert earphones and TDH headphones. Test results are consistent with normal hearing. Speech Recognition Thresholds were obtained at 15dB HL in the right ear and at 15dB HL in the left ear. Word Recognition Testing was completed at 55dB HL and [name] scored 100% in each ear.    Results:  The test results were reviewed with [name]. She has normal hearing in both ears.   Recommendations:  No further testing is recommended at this time. If speech/language delays or hearing difficulties are observed further audiological testing is recommended.        Use the Debrox Debrox Earwax Removal Drops Kit, see provided handout. Stop putting items into ear.    23 minutes spent testing and counseling on results.   If you have  any questions please feel free to contact me at [phone].  Ammie Ferrier Audiologist, Au.D., CCC-A 06/30/2023  1:39 PM  Cc: [name], MD

## 2023-07-14 ENCOUNTER — Other Ambulatory Visit: Payer: Self-pay | Admitting: *Deleted

## 2023-07-14 ENCOUNTER — Other Ambulatory Visit: Payer: Self-pay

## 2023-07-14 DIAGNOSIS — Z171 Estrogen receptor negative status [ER-]: Secondary | ICD-10-CM

## 2023-07-14 DIAGNOSIS — C50112 Malignant neoplasm of central portion of left female breast: Secondary | ICD-10-CM

## 2023-07-16 ENCOUNTER — Inpatient Hospital Stay: Payer: Medicare Other

## 2023-07-16 ENCOUNTER — Inpatient Hospital Stay (HOSPITAL_BASED_OUTPATIENT_CLINIC_OR_DEPARTMENT_OTHER): Payer: Medicare Other | Admitting: Hematology and Oncology

## 2023-07-16 VITALS — BP 132/61 | HR 66 | Resp 17

## 2023-07-16 DIAGNOSIS — Z171 Estrogen receptor negative status [ER-]: Secondary | ICD-10-CM

## 2023-07-16 DIAGNOSIS — C50812 Malignant neoplasm of overlapping sites of left female breast: Secondary | ICD-10-CM

## 2023-07-16 DIAGNOSIS — C50112 Malignant neoplasm of central portion of left female breast: Secondary | ICD-10-CM | POA: Diagnosis not present

## 2023-07-16 DIAGNOSIS — Z5112 Encounter for antineoplastic immunotherapy: Secondary | ICD-10-CM | POA: Diagnosis not present

## 2023-07-16 DIAGNOSIS — Z95828 Presence of other vascular implants and grafts: Secondary | ICD-10-CM

## 2023-07-16 LAB — CMP (CANCER CENTER ONLY)
ALT: 12 U/L (ref 0–44)
AST: 17 U/L (ref 15–41)
Albumin: 3.7 g/dL (ref 3.5–5.0)
Alkaline Phosphatase: 44 U/L (ref 38–126)
Anion gap: 6 (ref 5–15)
BUN: 33 mg/dL — ABNORMAL HIGH (ref 8–23)
CO2: 27 mmol/L (ref 22–32)
Calcium: 9.3 mg/dL (ref 8.9–10.3)
Chloride: 108 mmol/L (ref 98–111)
Creatinine: 1.18 mg/dL — ABNORMAL HIGH (ref 0.44–1.00)
GFR, Estimated: 52 mL/min — ABNORMAL LOW (ref >60)
Glucose, Bld: 94 mg/dL (ref 70–99)
Potassium: 3.8 mmol/L (ref 3.5–5.1)
Sodium: 141 mmol/L (ref 135–145)
Total Bilirubin: 0.4 mg/dL (ref 0.3–1.2)
Total Protein: 7.9 g/dL (ref 6.5–8.1)

## 2023-07-16 LAB — CBC WITH DIFFERENTIAL (CANCER CENTER ONLY)
Abs Immature Granulocytes: 0.01 10*3/uL (ref 0.00–0.07)
Basophils Absolute: 0 10*3/uL (ref 0.0–0.1)
Basophils Relative: 0 %
Eosinophils Absolute: 0.1 10*3/uL (ref 0.0–0.5)
Eosinophils Relative: 2 %
HCT: 30.2 % — ABNORMAL LOW (ref 36.0–46.0)
Hemoglobin: 9.6 g/dL — ABNORMAL LOW (ref 12.0–15.0)
Immature Granulocytes: 0 %
Lymphocytes Relative: 31 %
Lymphs Abs: 1.5 10*3/uL (ref 0.7–4.0)
MCH: 28.8 pg (ref 26.0–34.0)
MCHC: 31.8 g/dL (ref 30.0–36.0)
MCV: 90.7 fL (ref 80.0–100.0)
Monocytes Absolute: 0.6 10*3/uL (ref 0.1–1.0)
Monocytes Relative: 12 %
Neutro Abs: 2.6 10*3/uL (ref 1.7–7.7)
Neutrophils Relative %: 55 %
Platelet Count: 214 10*3/uL (ref 150–400)
RBC: 3.33 MIL/uL — ABNORMAL LOW (ref 3.87–5.11)
RDW: 14.3 % (ref 11.5–15.5)
WBC Count: 4.7 10*3/uL (ref 4.0–10.5)
nRBC: 0 % (ref 0.0–0.2)

## 2023-07-16 MED ORDER — SODIUM CHLORIDE 0.9% FLUSH
10.0000 mL | Freq: Once | INTRAVENOUS | Status: AC
  Administered 2023-07-16: 10 mL

## 2023-07-16 MED ORDER — HEPARIN SOD (PORK) LOCK FLUSH 100 UNIT/ML IV SOLN
500.0000 [IU] | Freq: Once | INTRAVENOUS | Status: AC | PRN
  Administered 2023-07-16: 500 [IU]

## 2023-07-16 MED ORDER — SODIUM CHLORIDE 0.9% FLUSH
10.0000 mL | INTRAVENOUS | Status: DC | PRN
  Administered 2023-07-16: 10 mL

## 2023-07-16 MED ORDER — TRASTUZUMAB-ANNS CHEMO 150 MG IV SOLR
6.0000 mg/kg | Freq: Once | INTRAVENOUS | Status: AC
  Administered 2023-07-16: 600 mg via INTRAVENOUS
  Filled 2023-07-16: qty 28.57

## 2023-07-16 MED ORDER — SODIUM CHLORIDE 0.9 % IV SOLN: Freq: Once | INTRAVENOUS | Status: AC

## 2023-07-16 MED ORDER — DIPHENHYDRAMINE HCL 25 MG PO CAPS
25.0000 mg | ORAL_CAPSULE | Freq: Once | ORAL | Status: AC
  Administered 2023-07-16: 25 mg via ORAL
  Filled 2023-07-16: qty 1

## 2023-07-16 MED ORDER — ACETAMINOPHEN 325 MG PO TABS
650.0000 mg | ORAL_TABLET | Freq: Once | ORAL | Status: AC
  Administered 2023-07-16: 650 mg via ORAL
  Filled 2023-07-16: qty 2

## 2023-07-16 NOTE — Assessment & Plan Note (Addendum)
[name] is a 67 year old woman with stage IV breast cancer currently on Herceptin every 3 weeks.  [name] is doing well today.  She has no clinical signs of progression of metastatic breast cancer. [name] will continue on Herceptin every 3 weeks and we will see her back in 6 weeks for labs follow-up and her next appointment. Recent restaging with no evidence of active disease. Next ECHO due in November.   Weight Management Patient reports minimal weight loss despite taking Mounjaro 7.5mg .  Reports some abdominal discomfort, possibly related to medication. Patient is also attempting to manage diet independently. -Continue Mounjaro 7.5mg  and monitor for weight loss and abdominal discomfort.  General Health Maintenance -Recommended RSV vaccination due to patient's age. -Schedule follow-up appointment.  All questions were answered. The patient knows to call the clinic with any problems, questions or concerns. We can certainly see the patient much sooner if necessary.

## 2023-07-16 NOTE — Patient Instructions (Signed)
[facility]  Discharge Instructions: Thank you for choosing [facility] to provide your oncology and hematology care.   If you have a lab appointment with the Cancer Center, please go directly to the Cancer Center and check in at the registration area.   Wear comfortable clothing and clothing appropriate for easy access to any Portacath or PICC line.   We strive to give you quality time with your provider. You may need to reschedule your appointment if you arrive late (15 or more minutes).  Arriving late affects you and other patients whose appointments are after yours.  Also, if you miss three or more appointments without notifying the office, you may be dismissed from the clinic at the provider's discretion.      For prescription refill requests, have your pharmacy contact our office and allow 72 hours for refills to be completed.    Today you received the following chemotherapy and/or immunotherapy agents: Trastuzumab      To help prevent nausea and vomiting after your treatment, we encourage you to take your nausea medication as directed.  BELOW ARE SYMPTOMS THAT SHOULD BE REPORTED IMMEDIATELY: *FEVER GREATER THAN 100.4 F (38 C) OR HIGHER *CHILLS OR SWEATING *NAUSEA AND VOMITING THAT IS NOT CONTROLLED WITH YOUR NAUSEA MEDICATION *UNUSUAL SHORTNESS OF BREATH *UNUSUAL BRUISING OR BLEEDING *URINARY PROBLEMS (pain or burning when urinating, or frequent urination) *BOWEL PROBLEMS (unusual diarrhea, constipation, pain near the anus) TENDERNESS IN MOUTH AND THROAT WITH OR WITHOUT PRESENCE OF ULCERS (sore throat, sores in mouth, or a toothache) UNUSUAL RASH, SWELLING OR PAIN  UNUSUAL VAGINAL DISCHARGE OR ITCHING   Items with * indicate a potential emergency and should be followed up as soon as possible or go to the Emergency Department if any problems should occur.  Please show the CHEMOTHERAPY ALERT CARD or IMMUNOTHERAPY ALERT CARD at  check-in to the Emergency Department and triage nurse.  Should you have questions after your visit or need to cancel or reschedule your appointment, please contact [facility]  Dept: [phone]  and follow the prompts.  Office hours are 8:00 a.m. to 4:30 p.m. Monday - Friday. Please note that voicemails left after 4:00 p.m. may not be returned until the following business day.  We are closed weekends and major holidays. You have access to a nurse at all times for urgent questions. Please call the main number to the clinic Dept: [phone] and follow the prompts.   For any non-urgent questions, you may also contact your provider using MyChart. We now offer e-Visits for anyone 18 and older to request care online for non-urgent symptoms. For details visit [email or web address].   Also download the MyChart app! Go to the app store, search "MyChart", open the app, select [facility], and log in with your MyChart username and password.  

## 2023-07-16 NOTE — Progress Notes (Signed)
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 Father        ETOH dependence   Breast cancer Maternal Aunt        dx in her 31s   Lung cancer Maternal Uncle    Breast cancer Paternal Aunt    Cancer Maternal Grandmother        salivary gland cancer   Colon cancer Neg Hx    Colon polyps Neg Hx    Esophageal cancer Neg Hx    Stomach cancer Neg Hx    Rectal cancer Neg Hx     Review of Systems  Constitutional:  Negative for appetite change, chills, fatigue, fever and unexpected weight change.  HENT:   Negative for hearing loss, lump/mass and trouble swallowing.   Eyes:  Negative for eye problems and icterus.  Respiratory:  Negative for chest tightness, cough and shortness of breath.   Cardiovascular:  Negative for chest pain, leg swelling and palpitations.  Gastrointestinal:  Negative for abdominal distention, abdominal pain, constipation, diarrhea, nausea and vomiting.   Endocrine: Negative for hot flashes.  Genitourinary:  Negative for difficulty urinating.   Musculoskeletal:  Negative for arthralgias.  Skin:  Negative for itching and rash.  Neurological:  Negative for dizziness, extremity weakness, headaches and numbness.  Hematological:  Negative for adenopathy. Does not bruise/bleed easily.  Psychiatric/Behavioral:  Negative for depression. The patient is not nervous/anxious.       PHYSICAL EXAMINATION     Vitals:   07/16/23 0831  BP: 114/70  Pulse: 63  Resp: 16  Temp: 97.8 F (36.6 C)  SpO2: 100%     Physical Exam Constitutional:      General: She is not in acute distress.    Appearance: Normal appearance. She is not toxic-appearing.  HENT:     Head: Normocephalic and atraumatic.     Mouth/Throat:     Mouth: Mucous membranes are moist.     Pharynx: Oropharynx is clear. No oropharyngeal exudate or posterior oropharyngeal erythema.  Eyes:     General: No scleral icterus. Cardiovascular:     Rate and Rhythm: Normal rate and regular rhythm.  Pulses: Normal pulses.     Heart sounds: Normal heart sounds.  Pulmonary:     Effort: Pulmonary effort is normal.     Breath sounds: Normal breath sounds.  Abdominal:     General: Abdomen is flat. Bowel sounds are normal. There is no distension.     Palpations: Abdomen is soft.     Tenderness: There is no abdominal tenderness.  Musculoskeletal:        General: No swelling.     Cervical back: Neck supple.  Lymphadenopathy:     Cervical: No cervical adenopathy.  Skin:    General: Skin is warm and dry.     Findings: No rash.  Neurological:     General: No focal deficit present.     Mental Status: She is alert.  Psychiatric:        Mood and Affect: Mood normal.        Behavior: Behavior normal.     LABORATORY DATA:  CBC    Component Value Date/Time   WBC 4.7 07/16/2023 0806   WBC 6.4 09/27/2020 1114   RBC 3.33 (L) 07/16/2023 0806   HGB 9.6 (L) 07/16/2023 0806   HGB 11.9  03/30/2017 1044   HCT 30.2 (L) 07/16/2023 0806   HCT 37.4 03/30/2017 1044   PLT 214 07/16/2023 0806   PLT 238 03/30/2017 1044   MCV 90.7 07/16/2023 0806   MCV 85.0 03/30/2017 1044   MCH 28.8 07/16/2023 0806   MCHC 31.8 07/16/2023 0806   RDW 14.3 07/16/2023 0806   RDW 15.1 (H) 03/30/2017 1044   LYMPHSABS 1.5 07/16/2023 0806   LYMPHSABS 1.3 03/30/2017 1044   MONOABS 0.6 07/16/2023 0806   MONOABS 0.5 03/30/2017 1044   EOSABS 0.1 07/16/2023 0806   EOSABS 0.0 03/30/2017 1044   BASOSABS 0.0 07/16/2023 0806   BASOSABS 0.0 03/30/2017 1044    CMP     Component Value Date/Time   NA 141 07/16/2023 0806   NA 143 03/30/2017 1044   K 3.8 07/16/2023 0806   K 3.8 03/30/2017 1044   CL 108 07/16/2023 0806   CL 106 01/18/2013 0909   CO2 27 07/16/2023 0806   CO2 27 03/30/2017 1044   GLUCOSE 94 07/16/2023 0806   GLUCOSE 91 03/30/2017 1044   GLUCOSE 99 01/18/2013 0909   BUN 33 (H) 07/16/2023 0806   BUN 9.7 03/30/2017 1044   CREATININE 1.18 (H) 07/16/2023 0806   CREATININE 0.8 03/30/2017 1044   CALCIUM 9.3 07/16/2023 0806   CALCIUM 9.7 03/30/2017 1044   PROT 7.9 07/16/2023 0806   PROT 8.0 03/30/2017 1044   ALBUMIN 3.7 07/16/2023 0806   ALBUMIN 3.8 01/27/2021 1232   ALBUMIN 3.4 (L) 03/30/2017 1044   AST 17 07/16/2023 0806   AST 17 03/30/2017 1044   ALT 12 07/16/2023 0806   ALT 15 03/30/2017 1044   ALKPHOS 44 07/16/2023 0806   ALKPHOS 60 03/30/2017 1044   BILITOT 0.4 07/16/2023 0806   BILITOT 0.34 03/30/2017 1044   GFRNONAA 52 (L) 07/16/2023 0806   GFRAA >60 07/11/2020 1610       PENDING LABS:   RADIOGRAPHIC STUDIES:  No results found.   PATHOLOGY:     ASSESSMENT and THERAPY PLAN:   Malignant neoplasm of central portion of left breast in female, estrogen receptor negative (HCC) Gowri is a 67 year old woman with stage IV breast cancer currently on Herceptin every 3 weeks.  Joycelynn is doing well today.  She has no clinical signs of progression of metastatic breast  cancer. Tamikia will continue on Herceptin every 3 weeks and we will see her back in 6 weeks for labs follow-up and her next appointment. Recent restaging with no evidence of active disease. Next ECHO due in November.   Weight Management Patient reports minimal weight loss despite taking Mounjaro 7.5mg .  Reports some abdominal discomfort, possibly related to medication. Patient is also attempting to manage diet independently. -Continue Mounjaro 7.5mg  and monitor for weight loss and abdominal discomfort.  General Health Maintenance -Recommended RSV vaccination due to patient's age. -Schedule follow-up appointment.  All questions were answered. The patient knows to call the clinic with any problems, questions or concerns. We can certainly see the patient much sooner if necessary.   All questions were answered. The patient knows to call the clinic with any problems, questions or concerns. We can certainly see the patient much sooner if necessary.  Total encounter time:30 minutes*in face-to-face visit time, chart review, lab review, care coordination, order entry, and documentation of the encounter time.    *Total Encounter Time as defined by the Centers for Medicare and Medicaid Services includes, in addition to the face-to-face time of a patient visit (documented in the note above) non-face-to-face time: obtaining and reviewing outside history, ordering and reviewing medications, tests or procedures, care coordination (communications with other health care professionals or caregivers) and documentation in the medical record.

## 2023-07-19 ENCOUNTER — Ambulatory Visit: Payer: Medicare Other | Admitting: Adult Health

## 2023-07-19 ENCOUNTER — Ambulatory Visit: Payer: Medicare Other

## 2023-07-19 ENCOUNTER — Other Ambulatory Visit: Payer: Medicare Other

## 2023-07-21 ENCOUNTER — Encounter (INDEPENDENT_AMBULATORY_CARE_PROVIDER_SITE_OTHER): Payer: Self-pay | Admitting: Otolaryngology

## 2023-07-21 ENCOUNTER — Ambulatory Visit (INDEPENDENT_AMBULATORY_CARE_PROVIDER_SITE_OTHER): Payer: Medicare Other | Admitting: Otolaryngology

## 2023-07-21 VITALS — BP 126/83 | HR 76 | Ht 59.0 in | Wt 239.0 lb

## 2023-07-21 DIAGNOSIS — R42 Dizziness and giddiness: Secondary | ICD-10-CM

## 2023-07-21 DIAGNOSIS — J3489 Other specified disorders of nose and nasal sinuses: Secondary | ICD-10-CM | POA: Diagnosis not present

## 2023-07-21 DIAGNOSIS — J342 Deviated nasal septum: Secondary | ICD-10-CM | POA: Diagnosis not present

## 2023-07-21 DIAGNOSIS — J3089 Other allergic rhinitis: Secondary | ICD-10-CM

## 2023-07-21 DIAGNOSIS — J329 Chronic sinusitis, unspecified: Secondary | ICD-10-CM

## 2023-07-21 DIAGNOSIS — H8110 Benign paroxysmal vertigo, unspecified ear: Secondary | ICD-10-CM

## 2023-07-21 DIAGNOSIS — J343 Hypertrophy of nasal turbinates: Secondary | ICD-10-CM

## 2023-07-21 DIAGNOSIS — M2740 Unspecified cyst of jaw: Secondary | ICD-10-CM

## 2023-07-21 DIAGNOSIS — H6993 Unspecified Eustachian tube disorder, bilateral: Secondary | ICD-10-CM

## 2023-07-21 DIAGNOSIS — R0981 Nasal congestion: Secondary | ICD-10-CM

## 2023-07-21 NOTE — Patient Instructions (Addendum)
-   schedule vestibular rehab (referral was sent) - start Flonase twice daily and continue Clarinex daily  - we will have you return in 3 months for repeat exam and to check on your sx.  - schedule a scan of your face (Max/Face CT)  - return in 3 months for f/u - try nasal saline rinses   [facility] Med Nasal Saline Rinse   - start nasal saline rinses with [employer] available over the counter or online to help with nasal congestion
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
## 2023-07-23 ENCOUNTER — Telehealth: Payer: Self-pay | Admitting: Pharmacist

## 2023-07-23 MED ORDER — MOUNJARO 10 MG/0.5ML ~~LOC~~ SOAJ: 10.0000 mg | SUBCUTANEOUS | 0 refills | Status: DC

## 2023-07-23 NOTE — Telephone Encounter (Signed)
Called pt to follow up with Mounjaro tolerability. Has been on 7.5mg  for the past month and tolerating well, rare abdominal pain (pain a few days after an injection that lasts a day or so, not with every injection). Isn't sure if it's the med or what she's eating. Reviewed trying to avoid fried/greasy/spicy foods which can upset stomach more. Not losing weight and appetite isn't curbed. Wishes to increase her dose. Rx sent in for 10mg .

## 2023-07-26 ENCOUNTER — Encounter: Payer: Self-pay | Admitting: Hematology and Oncology

## 2023-07-27 ENCOUNTER — Encounter: Payer: Self-pay | Admitting: Pharmacist

## 2023-07-27 NOTE — Progress Notes (Signed)
Pharmacy Quality Measure Review  This patient is appearing on a report for being at risk of failing the adherence measure for diabetes medications this calendar year.   Medication: Mounjaro 7.5 mg Last fill date: 07/23/2023 for 28 day supply for Mounjaro 10 mg (dose increased)  Insurance report was not up to date. No action needed at this time.   Arbutus Leas, PharmD, BCPS [facility] [phone]

## 2023-08-02 ENCOUNTER — Ambulatory Visit (HOSPITAL_COMMUNITY)
Admission: RE | Admit: 2023-08-02 | Discharge: 2023-08-02 | Disposition: A | Discharge status: Home / Self Care | Payer: Medicare Other | Source: Ambulatory Visit | Attending: Otolaryngology | Admitting: Otolaryngology

## 2023-08-02 ENCOUNTER — Encounter (HOSPITAL_COMMUNITY): Payer: Self-pay

## 2023-08-02 DIAGNOSIS — J3489 Other specified disorders of nose and nasal sinuses: Secondary | ICD-10-CM | POA: Diagnosis present

## 2023-08-02 DIAGNOSIS — M2740 Unspecified cyst of jaw: Secondary | ICD-10-CM | POA: Insufficient documentation

## 2023-08-03 ENCOUNTER — Encounter: Payer: Self-pay | Admitting: Hematology and Oncology

## 2023-08-03 ENCOUNTER — Other Ambulatory Visit: Payer: Self-pay | Admitting: Hematology and Oncology

## 2023-08-03 DIAGNOSIS — C50112 Malignant neoplasm of central portion of left female breast: Secondary | ICD-10-CM

## 2023-08-03 DIAGNOSIS — Z171 Estrogen receptor negative status [ER-]: Secondary | ICD-10-CM

## 2023-08-03 NOTE — Progress Notes (Signed)
Herceptin orders placed.

## 2023-08-03 NOTE — Telephone Encounter (Signed)
TC

## 2023-08-05 ENCOUNTER — Ambulatory Visit: Payer: Medicare Other

## 2023-08-05 ENCOUNTER — Inpatient Hospital Stay: Payer: Medicare Other | Attending: Hematology and Oncology

## 2023-08-05 ENCOUNTER — Inpatient Hospital Stay: Payer: Medicare Other

## 2023-08-05 VITALS — BP 128/82 | HR 72 | Temp 98.2°F | Resp 18 | Wt 238.0 lb

## 2023-08-05 DIAGNOSIS — Z5112 Encounter for antineoplastic immunotherapy: Secondary | ICD-10-CM | POA: Diagnosis present

## 2023-08-05 DIAGNOSIS — C773 Secondary and unspecified malignant neoplasm of axilla and upper limb lymph nodes: Secondary | ICD-10-CM | POA: Insufficient documentation

## 2023-08-05 DIAGNOSIS — C50112 Malignant neoplasm of central portion of left female breast: Secondary | ICD-10-CM

## 2023-08-05 DIAGNOSIS — C50812 Malignant neoplasm of overlapping sites of left female breast: Secondary | ICD-10-CM | POA: Insufficient documentation

## 2023-08-05 DIAGNOSIS — Z171 Estrogen receptor negative status [ER-]: Secondary | ICD-10-CM | POA: Diagnosis not present

## 2023-08-05 DIAGNOSIS — Z95828 Presence of other vascular implants and grafts: Secondary | ICD-10-CM

## 2023-08-05 LAB — CMP (CANCER CENTER ONLY)
ALT: 13 U/L (ref 0–44)
AST: 17 U/L (ref 15–41)
Albumin: 3.7 g/dL (ref 3.5–5.0)
Alkaline Phosphatase: 44 U/L (ref 38–126)
Anion gap: 7 (ref 5–15)
BUN: 33 mg/dL — ABNORMAL HIGH (ref 8–23)
CO2: 28 mmol/L (ref 22–32)
Calcium: 9.5 mg/dL (ref 8.9–10.3)
Chloride: 105 mmol/L (ref 98–111)
Creatinine: 1.11 mg/dL — ABNORMAL HIGH (ref 0.44–1.00)
GFR, Estimated: 56 mL/min — ABNORMAL LOW (ref >60)
Glucose, Bld: 110 mg/dL — ABNORMAL HIGH (ref 70–99)
Potassium: 3.7 mmol/L (ref 3.5–5.1)
Sodium: 140 mmol/L (ref 135–145)
Total Bilirubin: 0.4 mg/dL (ref 0.3–1.2)
Total Protein: 7.8 g/dL (ref 6.5–8.1)

## 2023-08-05 LAB — CBC WITH DIFFERENTIAL (CANCER CENTER ONLY)
Abs Immature Granulocytes: 0.01 10*3/uL (ref 0.00–0.07)
Basophils Absolute: 0 10*3/uL (ref 0.0–0.1)
Basophils Relative: 0 %
Eosinophils Absolute: 0.1 10*3/uL (ref 0.0–0.5)
Eosinophils Relative: 2 %
HCT: 30.8 % — ABNORMAL LOW (ref 36.0–46.0)
Hemoglobin: 9.8 g/dL — ABNORMAL LOW (ref 12.0–15.0)
Immature Granulocytes: 0 %
Lymphocytes Relative: 28 %
Lymphs Abs: 1.4 10*3/uL (ref 0.7–4.0)
MCH: 29 pg (ref 26.0–34.0)
MCHC: 31.8 g/dL (ref 30.0–36.0)
MCV: 91.1 fL (ref 80.0–100.0)
Monocytes Absolute: 0.6 10*3/uL (ref 0.1–1.0)
Monocytes Relative: 12 %
Neutro Abs: 2.9 10*3/uL (ref 1.7–7.7)
Neutrophils Relative %: 58 %
Platelet Count: 213 10*3/uL (ref 150–400)
RBC: 3.38 MIL/uL — ABNORMAL LOW (ref 3.87–5.11)
RDW: 14.4 % (ref 11.5–15.5)
WBC Count: 5.1 10*3/uL (ref 4.0–10.5)
nRBC: 0 % (ref 0.0–0.2)

## 2023-08-05 MED ORDER — TRASTUZUMAB-ANNS CHEMO 150 MG IV SOLR
6.0000 mg/kg | Freq: Once | INTRAVENOUS | Status: AC
  Administered 2023-08-05: 600 mg via INTRAVENOUS
  Filled 2023-08-05: qty 28.57

## 2023-08-05 MED ORDER — ACETAMINOPHEN 325 MG PO TABS
650.0000 mg | ORAL_TABLET | Freq: Once | ORAL | Status: AC
  Administered 2023-08-05: 650 mg via ORAL
  Filled 2023-08-05: qty 2

## 2023-08-05 MED ORDER — SODIUM CHLORIDE 0.9% FLUSH
10.0000 mL | Freq: Once | INTRAVENOUS | Status: AC
  Administered 2023-08-05: 10 mL

## 2023-08-05 MED ORDER — DIPHENHYDRAMINE HCL 25 MG PO CAPS
25.0000 mg | ORAL_CAPSULE | Freq: Once | ORAL | Status: AC
  Administered 2023-08-05: 25 mg via ORAL
  Filled 2023-08-05: qty 1

## 2023-08-05 MED ORDER — SODIUM CHLORIDE 0.9 % IV SOLN: Freq: Once | INTRAVENOUS | Status: AC

## 2023-08-05 NOTE — Patient Instructions (Signed)
[facility]  Discharge Instructions: Thank you for choosing [facility] to provide your oncology and hematology care.   If you have a lab appointment with the Cancer Center, please go directly to the Cancer Center and check in at the registration area.   Wear comfortable clothing and clothing appropriate for easy access to any Portacath or PICC line.   We strive to give you quality time with your provider. You may need to reschedule your appointment if you arrive late (15 or more minutes).  Arriving late affects you and other patients whose appointments are after yours.  Also, if you miss three or more appointments without notifying the office, you may be dismissed from the clinic at the provider's discretion.      For prescription refill requests, have your pharmacy contact our office and allow 72 hours for refills to be completed.    Today you received the following chemotherapy and/or immunotherapy agents: Trastuzumab      To help prevent nausea and vomiting after your treatment, we encourage you to take your nausea medication as directed.  BELOW ARE SYMPTOMS THAT SHOULD BE REPORTED IMMEDIATELY: *FEVER GREATER THAN 100.4 F (38 C) OR HIGHER *CHILLS OR SWEATING *NAUSEA AND VOMITING THAT IS NOT CONTROLLED WITH YOUR NAUSEA MEDICATION *UNUSUAL SHORTNESS OF BREATH *UNUSUAL BRUISING OR BLEEDING *URINARY PROBLEMS (pain or burning when urinating, or frequent urination) *BOWEL PROBLEMS (unusual diarrhea, constipation, pain near the anus) TENDERNESS IN MOUTH AND THROAT WITH OR WITHOUT PRESENCE OF ULCERS (sore throat, sores in mouth, or a toothache) UNUSUAL RASH, SWELLING OR PAIN  UNUSUAL VAGINAL DISCHARGE OR ITCHING   Items with * indicate a potential emergency and should be followed up as soon as possible or go to the Emergency Department if any problems should occur.  Please show the CHEMOTHERAPY ALERT CARD or IMMUNOTHERAPY ALERT CARD at  check-in to the Emergency Department and triage nurse.  Should you have questions after your visit or need to cancel or reschedule your appointment, please contact [facility]  Dept: [phone]  and follow the prompts.  Office hours are 8:00 a.m. to 4:30 p.m. Monday - Friday. Please note that voicemails left after 4:00 p.m. may not be returned until the following business day.  We are closed weekends and major holidays. You have access to a nurse at all times for urgent questions. Please call the main number to the clinic Dept: [phone] and follow the prompts.   For any non-urgent questions, you may also contact your provider using MyChart. We now offer e-Visits for anyone 65 and older to request care online for non-urgent symptoms. For details visit [email or web address].   Also download the MyChart app! Go to the app store, search "MyChart", open the app, select [facility], and log in with your MyChart username and password.

## 2023-08-09 ENCOUNTER — Encounter: Payer: Self-pay | Admitting: Pharmacist

## 2023-08-09 NOTE — Progress Notes (Signed)
Pharmacy Quality Measure Review  This patient is appearing on report for being at risk of failing the measure for Statin Use in Persons with Diabetes (SUPD) medications this calendar year.   Prior trials of: rosuvastatin (bone pain) and pravastatin (myalgias)  Added message on PCP appt 10/29 to consider adding G72.0 statin myopathy code to satisfy statin exclusion.   Arbutus Leas, PharmD, BCPS Clinical Pharmacist [facility] [phone]

## 2023-08-17 ENCOUNTER — Encounter: Payer: Self-pay | Admitting: Internal Medicine

## 2023-08-17 ENCOUNTER — Ambulatory Visit (INDEPENDENT_AMBULATORY_CARE_PROVIDER_SITE_OTHER): Payer: Medicare Other | Admitting: Internal Medicine

## 2023-08-17 VITALS — BP 126/78 | HR 64 | Temp 98.3°F | Ht 59.0 in | Wt 243.0 lb

## 2023-08-17 DIAGNOSIS — E559 Vitamin D deficiency, unspecified: Secondary | ICD-10-CM | POA: Diagnosis not present

## 2023-08-17 DIAGNOSIS — G720 Drug-induced myopathy: Secondary | ICD-10-CM | POA: Diagnosis not present

## 2023-08-17 DIAGNOSIS — E119 Type 2 diabetes mellitus without complications: Secondary | ICD-10-CM | POA: Diagnosis not present

## 2023-08-17 DIAGNOSIS — L0291 Cutaneous abscess, unspecified: Secondary | ICD-10-CM | POA: Diagnosis not present

## 2023-08-17 DIAGNOSIS — E7800 Pure hypercholesterolemia, unspecified: Secondary | ICD-10-CM

## 2023-08-17 DIAGNOSIS — R21 Rash and other nonspecific skin eruption: Secondary | ICD-10-CM | POA: Diagnosis not present

## 2023-08-17 DIAGNOSIS — T466X5A Adverse effect of antihyperlipidemic and antiarteriosclerotic drugs, initial encounter: Secondary | ICD-10-CM

## 2023-08-17 DIAGNOSIS — I10 Essential (primary) hypertension: Secondary | ICD-10-CM

## 2023-08-17 DIAGNOSIS — F419 Anxiety disorder, unspecified: Secondary | ICD-10-CM

## 2023-08-17 DIAGNOSIS — F32A Depression, unspecified: Secondary | ICD-10-CM

## 2023-08-17 MED ORDER — KETOCONAZOLE 2 % EX CREA: 1.0000 | TOPICAL_CREAM | Freq: Every day | CUTANEOUS | 3 refills | Status: AC

## 2023-08-17 MED ORDER — POTASSIUM CHLORIDE CRYS ER 20 MEQ PO TBCR: 20.0000 meq | EXTENDED_RELEASE_TABLET | Freq: Two times a day (BID) | ORAL | 8 refills | Status: DC

## 2023-08-17 MED ORDER — FUROSEMIDE 40 MG PO TABS: 60.0000 mg | ORAL_TABLET | Freq: Every day | ORAL | 5 refills | Status: DC

## 2023-08-17 MED ORDER — DOXYCYCLINE HYCLATE 100 MG PO TABS: 100.0000 mg | ORAL_TABLET | Freq: Two times a day (BID) | ORAL | 0 refills | Status: DC

## 2023-08-17 MED ORDER — VALACYCLOVIR HCL 1 G PO TABS: 1000.0000 mg | ORAL_TABLET | Freq: Two times a day (BID) | ORAL | 5 refills | Status: AC

## 2023-08-17 NOTE — Assessment & Plan Note (Signed)
Lab Results  Component Value Date   LDLCALC 77 07/10/2022   Uncontrolled, for lower chol diet, pt declines statin or zetia or repatha

## 2023-08-17 NOTE — Patient Instructions (Addendum)
Please have your Shingrix (shingles) shots done at your local pharmacy.  Please take all new medication as prescribed - the antibiotic, valtrex, and ketoconozole cream as needed for the rash  Please continue all other medications as before, and refills have been done if requested.  Please have the pharmacy call with any other refills you may need.  Please keep your appointments with your specialists as you may have planned  We can hold on further lab testing today  Please make an Appointment to return in 6 months, or sooner if needed
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
## 2023-08-17 NOTE — Assessment & Plan Note (Signed)
Small early right groin - for doxycycline 100 bid

## 2023-08-17 NOTE — Assessment & Plan Note (Signed)
Chronic stable, cont current med tx °

## 2023-08-17 NOTE — Assessment & Plan Note (Signed)
BP Readings from Last 3 Encounters:  08/17/23 126/78  08/05/23 128/82  07/21/23 126/83   Stable, pt to continue medical treatment coreg 6.25 bid, micardis hct 80 12.5 qd

## 2023-08-17 NOTE — Assessment & Plan Note (Signed)
Unable to tolerate statin 

## 2023-08-17 NOTE — Assessment & Plan Note (Signed)
Last vitamin D Lab Results  Component Value Date   VD25OH 37.66 07/10/2022   Low, to start oral replacement  

## 2023-08-17 NOTE — Assessment & Plan Note (Signed)
Lab Results  Component Value Date   HGBA1C 6.1 02/15/2023   Stable, pt to continue current medical treatment mounjaro 10 mg for hopeful better wt loss

## 2023-08-17 NOTE — Assessment & Plan Note (Signed)
Under enlarging pannus - for ketocon cr prn asd

## 2023-08-23 ENCOUNTER — Encounter: Payer: Self-pay | Admitting: Hematology and Oncology

## 2023-08-24 ENCOUNTER — Ambulatory Visit (HOSPITAL_COMMUNITY): Payer: Medicare Other | Attending: Cardiology

## 2023-08-24 DIAGNOSIS — I5032 Chronic diastolic (congestive) heart failure: Secondary | ICD-10-CM | POA: Diagnosis present

## 2023-08-24 LAB — ECHOCARDIOGRAM COMPLETE
Area-P 1/2: 3.59 cm2
Est EF: 55
S' Lateral: 2.6 cm

## 2023-08-25 ENCOUNTER — Telehealth: Payer: Self-pay | Admitting: Pharmacist

## 2023-08-25 ENCOUNTER — Telehealth (HOSPITAL_COMMUNITY): Payer: Self-pay

## 2023-08-25 MED ORDER — MOUNJARO 12.5 MG/0.5ML ~~LOC~~ SOAJ: 12.5000 mg | SUBCUTANEOUS | 0 refills | Status: DC

## 2023-08-25 NOTE — Telephone Encounter (Signed)
Patient doing better on Mounjaro 10mg . Feels like it is working better, appetite is being more suppressed. Wants to go to 12.5mg . Rx sent in. Follow up in 1 month.

## 2023-08-25 NOTE — Telephone Encounter (Addendum)
Pt aware, agreeable, and verbalized understanding  ---- Message from Jacklynn Ganong sent at 08/24/2023  4:58 PM EST ----- EF 55%, normal strain. Stable echo

## 2023-08-26 ENCOUNTER — Inpatient Hospital Stay (HOSPITAL_BASED_OUTPATIENT_CLINIC_OR_DEPARTMENT_OTHER): Payer: Medicare Other

## 2023-08-26 ENCOUNTER — Inpatient Hospital Stay: Payer: Medicare Other | Attending: Hematology and Oncology | Admitting: Hematology and Oncology

## 2023-08-26 ENCOUNTER — Inpatient Hospital Stay: Payer: Medicare Other

## 2023-08-26 VITALS — BP 119/78 | HR 73 | Temp 97.2°F | Resp 18 | Wt 235.5 lb

## 2023-08-26 DIAGNOSIS — C50812 Malignant neoplasm of overlapping sites of left female breast: Secondary | ICD-10-CM | POA: Insufficient documentation

## 2023-08-26 DIAGNOSIS — Z170 Estrogen receptor positive status [ER+]: Secondary | ICD-10-CM | POA: Insufficient documentation

## 2023-08-26 DIAGNOSIS — Z171 Estrogen receptor negative status [ER-]: Secondary | ICD-10-CM

## 2023-08-26 DIAGNOSIS — Z923 Personal history of irradiation: Secondary | ICD-10-CM | POA: Insufficient documentation

## 2023-08-26 DIAGNOSIS — Z79810 Long term (current) use of selective estrogen receptor modulators (SERMs): Secondary | ICD-10-CM | POA: Insufficient documentation

## 2023-08-26 DIAGNOSIS — Z9079 Acquired absence of other genital organ(s): Secondary | ICD-10-CM | POA: Diagnosis not present

## 2023-08-26 DIAGNOSIS — C773 Secondary and unspecified malignant neoplasm of axilla and upper limb lymph nodes: Secondary | ICD-10-CM | POA: Diagnosis present

## 2023-08-26 DIAGNOSIS — Z5112 Encounter for antineoplastic immunotherapy: Secondary | ICD-10-CM | POA: Insufficient documentation

## 2023-08-26 DIAGNOSIS — Z9221 Personal history of antineoplastic chemotherapy: Secondary | ICD-10-CM | POA: Diagnosis not present

## 2023-08-26 DIAGNOSIS — Z90710 Acquired absence of both cervix and uterus: Secondary | ICD-10-CM | POA: Insufficient documentation

## 2023-08-26 DIAGNOSIS — Z8542 Personal history of malignant neoplasm of other parts of uterus: Secondary | ICD-10-CM | POA: Diagnosis not present

## 2023-08-26 DIAGNOSIS — C50112 Malignant neoplasm of central portion of left female breast: Secondary | ICD-10-CM | POA: Diagnosis not present

## 2023-08-26 DIAGNOSIS — Z90722 Acquired absence of ovaries, bilateral: Secondary | ICD-10-CM | POA: Insufficient documentation

## 2023-08-26 LAB — CMP (CANCER CENTER ONLY)
ALT: 12 U/L (ref 0–44)
AST: 17 U/L (ref 15–41)
Albumin: 3.7 g/dL (ref 3.5–5.0)
Alkaline Phosphatase: 44 U/L (ref 38–126)
Anion gap: 7 (ref 5–15)
BUN: 30 mg/dL — ABNORMAL HIGH (ref 8–23)
CO2: 27 mmol/L (ref 22–32)
Calcium: 9.4 mg/dL (ref 8.9–10.3)
Chloride: 106 mmol/L (ref 98–111)
Creatinine: 1.12 mg/dL — ABNORMAL HIGH (ref 0.44–1.00)
GFR, Estimated: 55 mL/min — ABNORMAL LOW (ref >60)
Glucose, Bld: 86 mg/dL (ref 70–99)
Potassium: 3.6 mmol/L (ref 3.5–5.1)
Sodium: 140 mmol/L (ref 135–145)
Total Bilirubin: 0.5 mg/dL (ref <1.2)
Total Protein: 7.9 g/dL (ref 6.5–8.1)

## 2023-08-26 LAB — CBC WITH DIFFERENTIAL (CANCER CENTER ONLY)
Abs Immature Granulocytes: 0.01 10*3/uL (ref 0.00–0.07)
Basophils Absolute: 0 10*3/uL (ref 0.0–0.1)
Basophils Relative: 0 %
Eosinophils Absolute: 0.1 10*3/uL (ref 0.0–0.5)
Eosinophils Relative: 2 %
HCT: 31.5 % — ABNORMAL LOW (ref 36.0–46.0)
Hemoglobin: 10.1 g/dL — ABNORMAL LOW (ref 12.0–15.0)
Immature Granulocytes: 0 %
Lymphocytes Relative: 33 %
Lymphs Abs: 1.7 10*3/uL (ref 0.7–4.0)
MCH: 28.9 pg (ref 26.0–34.0)
MCHC: 32.1 g/dL (ref 30.0–36.0)
MCV: 90.3 fL (ref 80.0–100.0)
Monocytes Absolute: 0.6 10*3/uL (ref 0.1–1.0)
Monocytes Relative: 12 %
Neutro Abs: 2.7 10*3/uL (ref 1.7–7.7)
Neutrophils Relative %: 53 %
Platelet Count: 209 10*3/uL (ref 150–400)
RBC: 3.49 MIL/uL — ABNORMAL LOW (ref 3.87–5.11)
RDW: 14.4 % (ref 11.5–15.5)
WBC Count: 5.2 10*3/uL (ref 4.0–10.5)
nRBC: 0 % (ref 0.0–0.2)

## 2023-08-26 MED ORDER — ACETAMINOPHEN 325 MG PO TABS
650.0000 mg | ORAL_TABLET | Freq: Once | ORAL | Status: AC
Start: 2023-08-26 — End: 2023-08-26
  Administered 2023-08-26: 650 mg via ORAL
  Filled 2023-08-26: qty 2

## 2023-08-26 MED ORDER — SODIUM CHLORIDE 0.9 % IV SOLN: Freq: Once | INTRAVENOUS | Status: AC

## 2023-08-26 MED ORDER — HEPARIN SOD (PORK) LOCK FLUSH 100 UNIT/ML IV SOLN: 500.0000 [IU] | Freq: Once | INTRAVENOUS | Status: DC | PRN

## 2023-08-26 MED ORDER — SODIUM CHLORIDE 0.9% FLUSH: 10.0000 mL | INTRAVENOUS | Status: DC | PRN

## 2023-08-26 MED ORDER — DIPHENHYDRAMINE HCL 25 MG PO CAPS
25.0000 mg | ORAL_CAPSULE | Freq: Once | ORAL | Status: AC
  Administered 2023-08-26: 25 mg via ORAL
  Filled 2023-08-26: qty 1

## 2023-08-26 MED ORDER — TRASTUZUMAB-ANNS CHEMO 150 MG IV SOLR
6.0000 mg/kg | Freq: Once | INTRAVENOUS | Status: AC
  Administered 2023-08-26: 600 mg via INTRAVENOUS
  Filled 2023-08-26: qty 28.57

## 2023-08-26 NOTE — Progress Notes (Signed)
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 Systems  Constitutional:  Negative for appetite change, chills, fatigue, fever and unexpected weight change.  HENT:   Negative for hearing loss, lump/mass and trouble swallowing.   Eyes:  Negative for eye problems and icterus.  Respiratory:  Negative for chest tightness, cough and shortness of breath.   Cardiovascular:  Negative for chest pain, leg swelling and palpitations.  Gastrointestinal:  Negative for abdominal distention, abdominal pain, constipation, diarrhea, nausea and vomiting.  Endocrine: Negative for hot flashes.  Genitourinary:  Negative for difficulty urinating.   Musculoskeletal:  Negative for arthralgias.  Skin:  Negative for itching and rash.  Neurological:  Negative for dizziness, extremity weakness, headaches and numbness.  Hematological:  Negative for adenopathy. Does not bruise/bleed easily.  Psychiatric/Behavioral:  Negative  for depression. The patient is not nervous/anxious.     PHYSICAL EXAMINATION   Vitals:   08/26/23 1256  BP: 119/78  Pulse: 73  Resp: 18  Temp: (!) 97.2 F (36.2 C)  SpO2: 100%    Physical Exam Constitutional:      General: She is not in acute distress.    Appearance: Normal appearance. She is not toxic-appearing.  HENT:     Head: Normocephalic and atraumatic.     Mouth/Throat:     Mouth: Mucous membranes are moist.     Pharynx: Oropharynx is clear. No oropharyngeal exudate or posterior oropharyngeal erythema.  Eyes:     General: No scleral icterus. Cardiovascular:     Rate and Rhythm: Normal rate and regular rhythm.     Pulses: Normal pulses.     Heart sounds: Normal heart sounds.  Pulmonary:     Effort: Pulmonary effort is normal.     Breath sounds: Normal breath sounds.  Abdominal:     General: Abdomen is flat. Bowel sounds are normal. There is no distension.     Palpations: Abdomen is soft.     Tenderness: There is  no abdominal tenderness.  Musculoskeletal:        General: No swelling.     Cervical back: Neck supple.  Lymphadenopathy:     Cervical: No cervical adenopathy.  Skin:    General: Skin is warm and dry.     Findings: No rash.  Neurological:     General: No focal deficit present.     Mental Status: She is alert.  Psychiatric:        Mood and Affect: Mood normal.        Behavior: Behavior normal.     LABORATORY DATA:  CBC    Component Value Date/Time   WBC 5.2 08/26/2023 1236   WBC 6.4 09/27/2020 1114   RBC 3.49 (L) 08/26/2023 1236   HGB 10.1 (L) 08/26/2023 1236   HGB 11.9 03/30/2017 1044   HCT 31.5 (L) 08/26/2023 1236   HCT 37.4 03/30/2017 1044   PLT 209 08/26/2023 1236   PLT 238 03/30/2017 1044   MCV 90.3 08/26/2023 1236   MCV 85.0 03/30/2017 1044   MCH 28.9 08/26/2023 1236   MCHC 32.1 08/26/2023 1236   RDW 14.4 08/26/2023 1236   RDW 15.1 (H) 03/30/2017 1044   LYMPHSABS 1.7 08/26/2023 1236   LYMPHSABS 1.3 03/30/2017 1044    MONOABS 0.6 08/26/2023 1236   MONOABS 0.5 03/30/2017 1044   EOSABS 0.1 08/26/2023 1236   EOSABS 0.0 03/30/2017 1044   BASOSABS 0.0 08/26/2023 1236   BASOSABS 0.0 03/30/2017 1044    CMP     Component Value Date/Time   NA 140 08/26/2023 1236   NA 143 03/30/2017 1044   K 3.6 08/26/2023 1236   K 3.8 03/30/2017 1044   CL 106 08/26/2023 1236   CL 106 01/18/2013 0909   CO2 27 08/26/2023 1236   CO2 27 03/30/2017 1044   GLUCOSE 86 08/26/2023 1236   GLUCOSE 91 03/30/2017 1044   GLUCOSE 99 01/18/2013 0909   BUN 30 (H) 08/26/2023 1236   BUN 9.7 03/30/2017 1044   CREATININE 1.12 (H) 08/26/2023 1236   CREATININE 0.8 03/30/2017 1044   CALCIUM 9.4 08/26/2023 1236   CALCIUM 9.7 03/30/2017 1044   PROT 7.9 08/26/2023 1236   PROT 8.0 03/30/2017 1044   ALBUMIN 3.7 08/26/2023 1236   ALBUMIN 3.8 01/27/2021 1232   ALBUMIN 3.4 (L) 03/30/2017 1044   AST 17 08/26/2023 1236   AST 17 03/30/2017 1044   ALT 12 08/26/2023 1236   ALT 15 03/30/2017 1044   ALKPHOS 44 08/26/2023 1236   ALKPHOS 60 03/30/2017 1044   BILITOT 0.5 08/26/2023 1236   BILITOT 0.34 03/30/2017 1044   GFRNONAA 55 (L) 08/26/2023 1236   GFRAA >60 07/11/2020 0923    RADIOGRAPHIC STUDIES:  No results found.  ASSESSMENT and THERAPY PLAN:   Malignant neoplasm of central portion of left breast in female, estrogen receptor negative (HCC) [name] is a 67 year old woman with stage IV breast cancer currently on Herceptin every 3 weeks.  [name] is doing well today.  She has no clinical signs of progression of metastatic breast cancer. [name] will continue on Herceptin every 3 weeks and we will see her back in 6 weeks for labs follow-up and her next appointment. Recent restaging with no evidence of active disease. Most recent ECHO in Nov, no change. Next imaging in Feb or as needed. FU with me in 6 weeks, herceptin every 3 weeks.  Cardiac Evaluation Recent echocardiogram with mild valve regurgitation. No evidence of clot. Patient  reports occasional sensation of cold water  trickling down chest, likely unrelated to cardiac pathology. -Pt wants to discuss with her cardiologist as well,  -Discussed with Dr Shirlee Latch.  Neuropathy Patient reports abnormal sensation of cold trickling down chest and cramping in hands. 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 -Consider daily stretching exercises and yoga to help with muscle relaxation. -Consider increasing intake of potassium and magnesium rich foods, and hydration with water/coconut water/gatorade etc.  All questions were answered. The patient knows to call the clinic with any problems, questions or concerns. We can certainly see the patient much sooner if necessary.    All questions were answered. The patient knows to call the clinic with any problems, questions or concerns. We can certainly see the patient much sooner if necessary.  Total encounter time:30 minutes*in face-to-face visit time, chart review, lab review, care coordination, order entry, and documentation of the encounter time.  *Total Encounter Time as defined by the Centers for Medicare and Medicaid Services includes, in addition to the face-to-face time of a patient visit (documented in the note above) non-face-to-face time: obtaining and reviewing outside history, ordering and reviewing medications, tests or procedures, care coordination (communications with other health care professionals or caregivers) and documentation in the medical record.

## 2023-08-26 NOTE — Assessment & Plan Note (Addendum)
[name] is a 67 year old woman with stage IV breast cancer currently on Herceptin every 3 weeks.  [name] is doing well today.  She has no clinical signs of progression of metastatic breast cancer. [name] will continue on Herceptin every 3 weeks and we will see her back in 6 weeks for labs follow-up and her next appointment. Recent restaging with no evidence of active disease. Most recent ECHO in Nov, no change. Next imaging in Feb or as needed. FU with me in 6 weeks, herceptin every 3 weeks.  Cardiac Evaluation Recent echocardiogram with mild valve regurgitation. No evidence of clot. Patient reports occasional sensation of cold water trickling down chest, likely unrelated to cardiac pathology. -Pt wants to discuss with her cardiologist as well,  -Discussed with Dr Shirlee Latch.  Neuropathy Patient reports abnormal sensation of cold trickling down chest and cramping in hands. 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 -Consider daily stretching exercises and yoga to help with muscle relaxation. -Consider increasing intake of potassium and magnesium rich foods, and hydration with water/coconut water/gatorade etc.  All questions were answered. The patient knows to call the clinic with any problems, questions or concerns. We can certainly see the patient much sooner if necessary.

## 2023-09-01 ENCOUNTER — Telehealth: Payer: Self-pay | Admitting: Internal Medicine

## 2023-09-01 DIAGNOSIS — Z77011 Contact with and (suspected) exposure to lead: Secondary | ICD-10-CM

## 2023-09-01 NOTE — Telephone Encounter (Signed)
Patient received letter from [facility] stating that she may have been exposed to lead in her water supply.  Can she be tested for this.  Please call patient and advise.  [phone]

## 2023-09-01 NOTE — Telephone Encounter (Signed)
Ok I have placed lab order to be done at any time  thanks

## 2023-09-02 NOTE — Telephone Encounter (Signed)
Called and let Pt know

## 2023-09-06 ENCOUNTER — Other Ambulatory Visit: Payer: Medicare Other

## 2023-09-08 LAB — LEAD, BLOOD (ADULT >= 16 YRS): Lead: 1.3 ug/dL (ref <3.5)

## 2023-09-17 ENCOUNTER — Inpatient Hospital Stay: Payer: Medicare Other

## 2023-09-17 VITALS — BP 112/68 | HR 81 | Temp 97.6°F | Resp 18 | Wt 233.8 lb

## 2023-09-17 DIAGNOSIS — C50812 Malignant neoplasm of overlapping sites of left female breast: Secondary | ICD-10-CM

## 2023-09-17 DIAGNOSIS — Z5112 Encounter for antineoplastic immunotherapy: Secondary | ICD-10-CM | POA: Diagnosis not present

## 2023-09-17 DIAGNOSIS — C50112 Malignant neoplasm of central portion of left female breast: Secondary | ICD-10-CM

## 2023-09-17 LAB — CBC WITH DIFFERENTIAL (CANCER CENTER ONLY)
Abs Immature Granulocytes: 0 10*3/uL (ref 0.00–0.07)
Basophils Absolute: 0 10*3/uL (ref 0.0–0.1)
Basophils Relative: 0 %
Eosinophils Absolute: 0.1 10*3/uL (ref 0.0–0.5)
Eosinophils Relative: 2 %
HCT: 32.2 % — ABNORMAL LOW (ref 36.0–46.0)
Hemoglobin: 10.3 g/dL — ABNORMAL LOW (ref 12.0–15.0)
Immature Granulocytes: 0 %
Lymphocytes Relative: 23 %
Lymphs Abs: 1.4 10*3/uL (ref 0.7–4.0)
MCH: 28.8 pg (ref 26.0–34.0)
MCHC: 32 g/dL (ref 30.0–36.0)
MCV: 89.9 fL (ref 80.0–100.0)
Monocytes Absolute: 0.8 10*3/uL (ref 0.1–1.0)
Monocytes Relative: 12 %
Neutro Abs: 3.9 10*3/uL (ref 1.7–7.7)
Neutrophils Relative %: 63 %
Platelet Count: 225 10*3/uL (ref 150–400)
RBC: 3.58 MIL/uL — ABNORMAL LOW (ref 3.87–5.11)
RDW: 13.8 % (ref 11.5–15.5)
WBC Count: 6.2 10*3/uL (ref 4.0–10.5)
nRBC: 0 % (ref 0.0–0.2)

## 2023-09-17 LAB — CMP (CANCER CENTER ONLY)
ALT: 13 U/L (ref 0–44)
AST: 18 U/L (ref 15–41)
Albumin: 3.9 g/dL (ref 3.5–5.0)
Alkaline Phosphatase: 49 U/L (ref 38–126)
Anion gap: 8 (ref 5–15)
BUN: 26 mg/dL — ABNORMAL HIGH (ref 8–23)
CO2: 26 mmol/L (ref 22–32)
Calcium: 9.8 mg/dL (ref 8.9–10.3)
Chloride: 105 mmol/L (ref 98–111)
Creatinine: 1.12 mg/dL — ABNORMAL HIGH (ref 0.44–1.00)
GFR, Estimated: 55 mL/min — ABNORMAL LOW (ref >60)
Glucose, Bld: 90 mg/dL (ref 70–99)
Potassium: 3.3 mmol/L — ABNORMAL LOW (ref 3.5–5.1)
Sodium: 139 mmol/L (ref 135–145)
Total Bilirubin: 0.4 mg/dL (ref <1.2)
Total Protein: 8.1 g/dL (ref 6.5–8.1)

## 2023-09-17 MED ORDER — SODIUM CHLORIDE 0.9 % IV SOLN: Freq: Once | INTRAVENOUS | Status: AC

## 2023-09-17 MED ORDER — HEPARIN SOD (PORK) LOCK FLUSH 100 UNIT/ML IV SOLN
500.0000 [IU] | Freq: Once | INTRAVENOUS | Status: AC | PRN
  Administered 2023-09-17: 500 [IU]

## 2023-09-17 MED ORDER — DIPHENHYDRAMINE HCL 25 MG PO CAPS
25.0000 mg | ORAL_CAPSULE | Freq: Once | ORAL | Status: AC
  Administered 2023-09-17: 25 mg via ORAL
  Filled 2023-09-17: qty 1

## 2023-09-17 MED ORDER — TRASTUZUMAB-ANNS CHEMO 150 MG IV SOLR
6.0000 mg/kg | Freq: Once | INTRAVENOUS | Status: AC
  Administered 2023-09-17: 600 mg via INTRAVENOUS
  Filled 2023-09-17: qty 28.57

## 2023-09-17 MED ORDER — ACETAMINOPHEN 325 MG PO TABS
650.0000 mg | ORAL_TABLET | Freq: Once | ORAL | Status: AC
  Administered 2023-09-17: 650 mg via ORAL
  Filled 2023-09-17: qty 2

## 2023-09-17 MED ORDER — SODIUM CHLORIDE 0.9% FLUSH
10.0000 mL | INTRAVENOUS | Status: DC | PRN
  Administered 2023-09-17: 10 mL

## 2023-09-17 NOTE — Patient Instructions (Signed)
 [facility] - A DEPT OF [facility]  Discharge Instructions: Thank you for choosing [facility] to provide your oncology and hematology care.   If you have a lab appointment with the Cancer Center, please go directly to the Cancer Center and check in at the registration area.   Wear comfortable clothing and clothing appropriate for easy access to any Portacath or PICC line.   We strive to give you quality time with your provider. You may need to reschedule your appointment if you arrive late (15 or more minutes).  Arriving late affects you and other patients whose appointments are after yours.  Also, if you miss three or more appointments without notifying the office, you may be dismissed from the clinic at the provider's discretion.      For prescription refill requests, have your pharmacy contact our office and allow 72 hours for refills to be completed.    Today you received the following chemotherapy and/or immunotherapy agents :  Trastuzumab      To help prevent nausea and vomiting after your treatment, we encourage you to take your nausea medication as directed.  BELOW ARE SYMPTOMS THAT SHOULD BE REPORTED IMMEDIATELY: *FEVER GREATER THAN 100.4 F (38 C) OR HIGHER *CHILLS OR SWEATING *NAUSEA AND VOMITING THAT IS NOT CONTROLLED WITH YOUR NAUSEA MEDICATION *UNUSUAL SHORTNESS OF BREATH *UNUSUAL BRUISING OR BLEEDING *URINARY PROBLEMS (pain or burning when urinating, or frequent urination) *BOWEL PROBLEMS (unusual diarrhea, constipation, pain near the anus) TENDERNESS IN MOUTH AND THROAT WITH OR WITHOUT PRESENCE OF ULCERS (sore throat, sores in mouth, or a toothache) UNUSUAL RASH, SWELLING OR PAIN  UNUSUAL VAGINAL DISCHARGE OR ITCHING   Items with * indicate a potential emergency and should be followed up as soon as possible or go to the Emergency Department if any problems should occur.  Please show the CHEMOTHERAPY ALERT CARD or  IMMUNOTHERAPY ALERT CARD at check-in to the Emergency Department and triage nurse.  Should you have questions after your visit or need to cancel or reschedule your appointment, please contact [facility] - A DEPT OF [facility]  Dept: [phone]  and follow the prompts.  Office hours are 8:00 a.m. to 4:30 p.m. Monday - Friday. Please note that voicemails left after 4:00 p.m. may not be returned until the following business day.  We are closed weekends and major holidays. You have access to a nurse at all times for urgent questions. Please call the main number to the clinic Dept: [phone] and follow the prompts.   For any non-urgent questions, you may also contact your provider using MyChart. We now offer e-Visits for anyone 74 and older to request care online for non-urgent symptoms. For details visit [email or web address].   Also download the MyChart app! Go to the app store, search "MyChart", open the app, select , and log in with your MyChart username and password.

## 2023-09-22 MED ORDER — MOUNJARO 12.5 MG/0.5ML ~~LOC~~ SOAJ: 12.5000 mg | SUBCUTANEOUS | 11 refills | Status: DC

## 2023-09-22 NOTE — Telephone Encounter (Signed)
Patient doing well on Mounjaro 12.5mg . Would like to stay at same dose. Rx will refills sent. Pt aware to call if she needs to change dose.

## 2023-09-22 NOTE — Telephone Encounter (Signed)
 Called pt to f/u on Mounjaro tolerability. LVM for pt to call back

## 2023-09-22 NOTE — Addendum Note (Signed)
Addended by: Malena Peer D on: 09/22/2023 10:08 AM   Modules accepted: Orders

## 2023-09-27 ENCOUNTER — Encounter (HOSPITAL_COMMUNITY): Payer: Self-pay | Admitting: Cardiology

## 2023-09-27 ENCOUNTER — Ambulatory Visit (HOSPITAL_COMMUNITY)
Admission: RE | Admit: 2023-09-27 | Discharge: 2023-09-27 | Disposition: A | Discharge status: Home / Self Care | Payer: Medicare Other | Source: Ambulatory Visit | Attending: Cardiology | Admitting: Cardiology

## 2023-09-27 ENCOUNTER — Encounter: Payer: Self-pay | Admitting: Hematology and Oncology

## 2023-09-27 VITALS — BP 120/70 | HR 63 | Wt 234.6 lb

## 2023-09-27 DIAGNOSIS — C50112 Malignant neoplasm of central portion of left female breast: Secondary | ICD-10-CM

## 2023-09-27 DIAGNOSIS — E785 Hyperlipidemia, unspecified: Secondary | ICD-10-CM | POA: Insufficient documentation

## 2023-09-27 DIAGNOSIS — I5032 Chronic diastolic (congestive) heart failure: Secondary | ICD-10-CM | POA: Diagnosis present

## 2023-09-27 DIAGNOSIS — G8929 Other chronic pain: Secondary | ICD-10-CM | POA: Insufficient documentation

## 2023-09-27 DIAGNOSIS — M5459 Other low back pain: Secondary | ICD-10-CM | POA: Diagnosis not present

## 2023-09-27 DIAGNOSIS — Z171 Estrogen receptor negative status [ER-]: Secondary | ICD-10-CM

## 2023-09-27 DIAGNOSIS — Z6841 Body Mass Index (BMI) 40.0 and over, adult: Secondary | ICD-10-CM | POA: Diagnosis not present

## 2023-09-27 DIAGNOSIS — I110 Hypertensive heart disease with heart failure: Secondary | ICD-10-CM | POA: Diagnosis not present

## 2023-09-27 DIAGNOSIS — Z79899 Other long term (current) drug therapy: Secondary | ICD-10-CM | POA: Insufficient documentation

## 2023-09-27 DIAGNOSIS — Z7985 Long-term (current) use of injectable non-insulin antidiabetic drugs: Secondary | ICD-10-CM | POA: Insufficient documentation

## 2023-09-27 DIAGNOSIS — Z853 Personal history of malignant neoplasm of breast: Secondary | ICD-10-CM | POA: Insufficient documentation

## 2023-09-27 DIAGNOSIS — I428 Other cardiomyopathies: Secondary | ICD-10-CM | POA: Insufficient documentation

## 2023-09-27 DIAGNOSIS — E669 Obesity, unspecified: Secondary | ICD-10-CM | POA: Insufficient documentation

## 2023-09-27 DIAGNOSIS — Z86718 Personal history of other venous thrombosis and embolism: Secondary | ICD-10-CM | POA: Diagnosis not present

## 2023-09-27 DIAGNOSIS — Z7901 Long term (current) use of anticoagulants: Secondary | ICD-10-CM | POA: Insufficient documentation

## 2023-09-27 DIAGNOSIS — E1142 Type 2 diabetes mellitus with diabetic polyneuropathy: Secondary | ICD-10-CM | POA: Diagnosis not present

## 2023-09-27 LAB — BASIC METABOLIC PANEL
Anion gap: 9 (ref 5–15)
BUN: 26 mg/dL — ABNORMAL HIGH (ref 8–23)
CO2: 24 mmol/L (ref 22–32)
Calcium: 9.2 mg/dL (ref 8.9–10.3)
Chloride: 108 mmol/L (ref 98–111)
Creatinine, Ser: 1.21 mg/dL — ABNORMAL HIGH (ref 0.44–1.00)
GFR, Estimated: 50 mL/min — ABNORMAL LOW (ref >60)
Glucose, Bld: 84 mg/dL (ref 70–99)
Potassium: 3.5 mmol/L (ref 3.5–5.1)
Sodium: 141 mmol/L (ref 135–145)

## 2023-09-27 LAB — MAGNESIUM: Magnesium: 1.5 mg/dL — ABNORMAL LOW (ref 1.7–2.4)

## 2023-09-27 LAB — BRAIN NATRIURETIC PEPTIDE: B Natriuretic Peptide: 8.5 pg/mL (ref 0.0–100.0)

## 2023-09-27 MED ORDER — POTASSIUM CHLORIDE CRYS ER 20 MEQ PO TBCR: 20.0000 meq | EXTENDED_RELEASE_TABLET | Freq: Two times a day (BID) | ORAL | 6 refills | Status: DC

## 2023-09-27 NOTE — Patient Instructions (Signed)
Medication Changes:  RESTART Potassium 20 meq Twice daily   Lab Work:  Labs done today, your results will be available in MyChart, we will contact you for abnormal readings.   Testing/Procedures:  Your physician has requested that you have an echocardiogram. Echocardiography is a painless test that uses sound waves to create images of your heart. It provides your doctor with information about the size and shape of your heart and how well your heart's chambers and valves are working. This procedure takes approximately one hour. There are no restrictions for this procedure. Please do NOT wear cologne, perfume, aftershave, or lotions (deodorant is allowed). Please arrive 15 minutes prior to your appointment time.  Please note: We ask at that you not bring children with you during ultrasound (echo/ vascular) testing. Due to room size and safety concerns, children are not allowed in the ultrasound rooms during exams. Our front office staff cannot provide observation of children in our lobby area while testing is being conducted. An adult accompanying a patient to their appointment will only be allowed in the ultrasound room at the discretion of the ultrasound technician under special circumstances. We apologize for any inconvenience.  IN MAY 2025  Referrals:  NONE  Special Instructions // Education:  Do the following things EVERYDAY: Weigh yourself in the morning before breakfast. Write it down and keep it in a log. Take your medicines as prescribed Eat low salt foods--Limit salt (sodium) to 2000 mg per day.  Stay as active as you can everyday Limit all fluids for the day to less than 2 liters   Follow-Up in: 6 months   At the [facility], you and your health needs are our priority. We have a designated team specialized in the treatment of Heart Failure. This Care Team includes your primary Heart Failure Specialized Cardiologist (physician), Advanced Practice  Providers (APPs- Physician Assistants and Nurse Practitioners), and Pharmacist who all work together to provide you with the care you need, when you need it.   You may see any of the following providers on your designated Care Team at your next follow up:  Dr. Arvilla Meres Dr. Marca Ancona Dr. Dorthula Nettles Dr. Theresia Bough Tonye Becket, NP Robbie Lis, [facility] [place], [place] Brynda Peon, NP [place] Lee, NP Karle Plumber, PharmD   Please be sure to bring in all your medications bottles to every appointment.   Need to Contact us:  If you have any questions or concerns before your next appointment please send [place] a message through [place] or call our office at [phone].    TO LEAVE A MESSAGE FOR THE NURSE SELECT OPTION 2, PLEASE LEAVE A MESSAGE INCLUDING: YOUR NAME DATE OF BIRTH CALL BACK NUMBER REASON FOR CALL**this is important as we prioritize the call backs  YOU WILL RECEIVE A CALL BACK THE SAME DAY AS LONG AS YOU CALL BEFORE 4:00 PM

## 2023-09-28 ENCOUNTER — Telehealth (HOSPITAL_COMMUNITY): Payer: Self-pay

## 2023-09-28 ENCOUNTER — Other Ambulatory Visit: Payer: Self-pay | Admitting: Hematology and Oncology

## 2023-09-28 DIAGNOSIS — I5032 Chronic diastolic (congestive) heart failure: Secondary | ICD-10-CM

## 2023-09-28 MED ORDER — MAGNESIUM OXIDE 400 MG PO CAPS: 1.0000 | ORAL_CAPSULE | Freq: Every day | ORAL | 11 refills | Status: AC

## 2023-09-28 NOTE — Telephone Encounter (Signed)
-----   Message from Marca Ancona sent at 09/27/2023  5:21 PM EST ----- Start magnesium oxide 400 mg daily for low Mg.

## 2023-09-28 NOTE — Telephone Encounter (Signed)
Patient's magnesium oxide medication has been sent to her pharmacy. Pt aware, agreeable, and verbalized understanding.

## 2023-09-28 NOTE — Progress Notes (Signed)
Oncology: Dr. Al Pimple HF Cardiology: Dr. Shirlee Latch  67 y.o. with history of DM, HTN, hyperlipidemia, endometrial cancer, and breast cancer was referred by Dr. Darnelle Catalan for cardio-oncology evaluation.  Initial breast cancer diagnosis was in 2012.  She had left breast lumpectomy and radiation.  Recurrence in 7/20, ER+/PR-/HER2+.  She had chemotherapy with trastuzumab/pertuzumab/carboplatin/docetaxol x 6 starting 2/21.  With fall in EF in 4/21, Herceptin was held.  Echo in 4/21 showed EF 45-50%, Coreg was started. Echo in 6/21 showed EF back up to 55-60%, and Herceptin was restarted.  In 6/21, she had lumpectomy.  Echo in 7/21 showed EF stable at 55-60%.    Echo in 9/21 showed EF 55-60% with normal RV, strain images did not track well.  Coronary CTA was done in 9/21, showing coronary artery calcium score 0 and no significant CAD.    PET in 9/21 with new lymphadenopathy.  She was started on TDM-1/Kadcyla but later changed back to Herceptin which will continue for long-term.   In 12/21, she had a cervical discectomy and fusion.   Echo in 2/22 showed EF 55% with GLS -10.7% but poor endocardial tracing. Normal RV.  Echo in 4/22 showed EF 50-55%, mild LVH, normal RV, GLS -14.3% but poor endocardial tracing. Echo in 7/22 showed EF 55-60%, mild LVH, mild LAE, GLS -20.1%, normal RV, PASP 32 mmHg.  Echo in 10/22 showed EF 55-60%, mild LVH, GLS -20.1%, normal RV. Echo in 5/23 showed EF 55-60% with GLS -22.1%, normal RV.   Echo 11/23 showed EF 55% on my read (reported 50-55%), GLS -19.8%, normal RV.  Echo was done today and reviewed, technically difficult study with inaccurate strain imaging due to poor endocardial tracing, EF 55%, normal RV, normal IVC.   PYP scan in 3/24 was grade 2 with H/CL 1.08 (equivocal).   Follow up 5/24, stable NYHA III. TTR gene testing and cMRI arranged to evaluate for possible cardiac amyloidosis.   cMRI (6/24) showed LVEF 53%, RVEF 52%, no evidence for cardiac amyloidosis. [facility] gene  testing for hATTR was negative. Echo in 11/24 showed EF 55%, mild LVH, normal RV.   Today she returns for HF follow up. She has not been taking KCl, gets some cramping.  She continues on Herceptin.  Breathing is stable, not short of breath walking on flat ground. Gets fatigued/winded with stairs or if she "rushes."  No chest pain.  Main complaint is ongoing low back pain and pain from peripheral neuropathy.  Weight down 4 lbs.    ECG (personally reviewed): NSR, nonspecific T wave flattening  Labs (5/21): K 4.1, creatinine 0.89 Labs (7/21): K 4.2, creatinine 0.95 Labs (8/21): LDL 81, BNP 36 Labs (9/21): K 3.7, creatinine 0.94 Labs (1/22): K 3.2, creatinine 1.14 Labs (4/22): K 4.6, creatinine 1.02 Labs (5/22): negative serum and urine immunofixation Labs (6/22): ESR 87, ANA negative, ANCA negative, CK 225, hgb 10.2, LDL 110, K 3.7, creatinine 1.38 Labs (10/22): K 3.7, creatinine 1.28 Labs (5/23): K 3.9, creatinine 0.96 Labs (9/23): LDL 77 Labs (11/23): K 3.4, creatinine 1.10 Labs (12/23): K 3.7, creatinine 0.90 Labs (5/24): K 4, creatinine 0.89 Labs (7/24): K 3.8, creatinine 0.85 Labs (11/24): K 3.3, creatinine 1.12  PMH: 1. HTN 2. Diabetes 3. Endometrial carcinoma: 8/17 diagnosis, had hysterectomy and BSO.  4. Hyperlipidemia 5. Breast cancer: Initial diagnosis in 2012.  Had left breast lumpectomy and radiation.  Recurrence in 7/20, ER+/PR-/HER2+.  She had chemotherapy with trastuzumab/pertuzumab/carboplatin/docetaxol x 6 starting 2/21.  With fall in EF in 4/21, Herceptin  was held but later restarted.  Lumpectomy 6/21.  - Echo (1/21): EF 60-65%, GLS -20%, normal RV.  - Echo (4/21): EF 45-50%, global hypokinesis, GLS -12.6%, mildly decreased RV function.  - Echo (6/21): EF 55-60%, GLS -14.4%, mild LVH, PASP 38, RV normal.  - Echo (7/21): EF 55-60%, mild LVH, GLS -16.9%, RV  Normal - Echo (9/21): EF 55-60%, grade 2 diastolic dysfunction, GLS -11.6% (poor endocardial tracking), normal RV,  normal IVC.  - Echo (2/22): EF 55%, GLS -10.7% (poor endocardial tracking), normal RV.  - Echo (4/22): EF 50-55%, mild LVH, normal RV, GLS -14.3% but poor endocardial tracing. - Echo (7/22): EF 55-60%, mild LVH, mild LAE, GLS -20.1%, normal RV, PASP 32 mmHg.  - Echo (10/22): EF 55-60%, mild LVH, GLS -20.1%, normal RV.  - Echo (5/23): EF 55-60% with GLS -22.1%, normal RV.  - Echo (11/23): EF 55% on my read (reported 50-55%), GLS -19.8%, normal RV. - Echo (5/24): technically difficult study with inaccurate strain imaging due to poor endocardial tracing, EF 55%, normal RV, normal IVC.  - PYP scan (3/24): grade 2 with H/CL 1.08 (equivocal).  - cMRI (6/24) showed LVEF 53%, RVEF 52%, no evidence for cardiac amyloidosis - [facility] gene testing negative for hATTR. - Echo (11/24): EF 55%, mild LVH, normal RV.  6. Coronary calcium score scan (8/21): CAC 0 Agatston units.  - Coronary CTA (9/21): CAC 0 Agatston units.  No significant CAD.  7. DVT: Left leg, 8/21.  8. Cervical discectomy and fusion in 12/21.  9. Peripheral neuropathy 10. Carpal tunnel syndrome  Social History   Socioeconomic History   Marital status: Divorced    Spouse name: Not on file   Number of children: 0   Years of education: Not on file   Highest education level: Not on file  Occupational History   Occupation: Child psychotherapist    Employer: [employer]    Comment: retired  Tobacco Use   Smoking status: Never   Smokeless tobacco: Never  Vaping Use   Vaping status: Never Used  Substance and Sexual Activity   Alcohol use: Not Currently    Comment: occasional on holidays   Drug use: Never   Sexual activity: Not Currently    Birth control/protection: Post-menopausal, Surgical  Other Topics Concern   Not on file  Social History Narrative   1 son deceased with homicide   Patient has been divorced twice   Right handed   Drinks caffeine   One story    Social Determinants of Health   Financial Resource Strain: Low  Risk  (06/15/2023)   Overall Financial Resource Strain (CARDIA)    Difficulty of Paying Living Expenses: Not hard at all  Food Insecurity: No Food Insecurity (06/15/2023)   Hunger Vital Sign    Worried About Running Out of Food in the Last Year: Never true    Ran Out of Food in the Last Year: Never true  Transportation Needs: No Transportation Needs (06/15/2023)   PRAPARE - Administrator, Civil Service (Medical): No    Lack of Transportation (Non-Medical): No  Physical Activity: Inactive (06/15/2023)   Exercise Vital Sign    Days of Exercise per Week: 0 days    Minutes of Exercise per Session: 0 min  Stress: No Stress Concern Present (06/15/2023)   [employer] of Occupational Health - Occupational Stress Questionnaire    Feeling of Stress : Not at all  Social Connections: Moderately Integrated (06/15/2023)   Social Connection and  Isolation Panel [NHANES]    Frequency of Communication with Friends and Family: More than three times a week    Frequency of Social Gatherings with Friends and Family: More than three times a week    Attends Religious Services: More than 4 times per year    Active Member of [employer] or Organizations: Yes    Attends Engineer, structural: More than 4 times per year    Marital Status: Divorced  Intimate Partner Violence: Not At Risk (06/15/2023)   Humiliation, Afraid, Rape, and Kick questionnaire    Fear of Current or Ex-Partner: No    Emotionally Abused: No    Physically Abused: No    Sexually Abused: No   Family History  Problem Relation Age of Onset   Diabetes Mother    Hypertension Mother    Ovarian cancer Mother    Heart disease Father        COPD   Alcohol abuse Father        ETOH dependence   Breast cancer Maternal Aunt        dx in her 31s   Lung cancer Maternal Uncle    Breast cancer Paternal Aunt    Cancer Maternal Grandmother        salivary gland cancer   Colon cancer Neg Hx    Colon polyps Neg Hx    Esophageal  cancer Neg Hx    Stomach cancer Neg Hx    Rectal cancer Neg Hx    ROS: All systems reviewed and negative except as per HPI.   Current Outpatient Medications  Medication Sig Dispense Refill   acetaminophen (TYLENOL) 500 MG tablet Take 500 mg by mouth every 6 (six) hours as needed for moderate pain.     Ascorbic Acid (VITAMIN C PO) Take 1 tablet by mouth daily.     carvedilol (COREG) 6.25 MG tablet Take 6.25 mg by mouth 2 (two) times daily.     Cholecalciferol (VITAMIN D3 PO) Take 1 capsule by mouth daily.     Cyanocobalamin (B-12 PO) Take 1 capsule by mouth daily.     desloratadine (CLARINEX) 5 MG tablet Take 5 mg by mouth as needed.     fluticasone (FLONASE) 50 MCG/ACT nasal spray Place 2 sprays into both nostrils daily. 16 g 6   furosemide (LASIX) 40 MG tablet Take 1.5 tablets (60 mg total) by mouth daily. 45 tablet 5   gabapentin (NEURONTIN) 300 MG capsule Take 600 mg by mouth at bedtime as needed (pain).     ketoconazole (NIZORAL) 2 % cream Apply 1 Application topically daily. 30 g 3   methylcellulose oral powder Take 1 packet by mouth daily.     Multiple Vitamins-Minerals (MULTIVITAMIN WITH MINERALS) tablet Take 1 tablet by mouth daily.     silver sulfADIAZINE (SILVADENE) 1 % cream Apply 1 application  topically as needed (wound care).     telmisartan-hydrochlorothiazide (MICARDIS HCT) 80-12.5 MG tablet Take 1 tablet by mouth daily. 90 tablet 3   tirzepatide (MOUNJARO) 12.5 MG/0.5ML Pen Inject 12.5 mg into the skin once a week. 2 mL 11   XARELTO 20 MG TABS tablet TAKE 1 TABLET BY MOUTH DAILY WITH SUPPER. 30 tablet 11   potassium chloride SA (KLOR-CON M20) 20 MEQ tablet Take 1 tablet (20 mEq total) by mouth 2 (two) times daily. 60 tablet 6   No current facility-administered medications for this encounter.   Facility-Administered Medications Ordered in Other Encounters  Medication Dose Route Frequency Provider Last Rate  Last Admin   cloNIDine (CATAPRES) tablet 0.1 mg  0.1 mg Oral  Daily Ceasar Mons., PA-C   0.1 mg at 11/21/19 1742   heparin lock flush 100 unit/mL  500 Units Intracatheter Once [name], MD       Wt Readings from Last 3 Encounters:  09/27/23 106.4 kg (234 lb 9.6 oz)  09/17/23 106.1 kg (233 lb 12.8 oz)  08/26/23 106.8 kg (235 lb 8 oz)   BP 120/70   Pulse 63   Wt 106.4 kg (234 lb 9.6 oz)   LMP 05/18/2016   SpO2 99%   BMI 47.38 kg/m  General: NAD Neck: No JVD, no thyromegaly or thyroid nodule.  Lungs: Clear to auscultation bilaterally with normal respiratory effort. CV: Nondisplaced PMI.  Heart regular S1/S2, no S3/S4, no murmur.  Trace ankle edema.  No carotid bruit.  Normal pedal pulses.  Abdomen: Soft, nontender, no hepatosplenomegaly, no distention.  Skin: Intact without lesions or rashes.  Neurologic: Alert and oriented x 3.  Psych: Normal affect. Extremities: No clubbing or cyanosis.  HEENT: Normal.   Assessment/Plan: 1. Chemotherapy-related cardiomyopathy: Fall in EF and strain in the setting of Herceptin use.  Echo in 4/21 with EF 45-50% with GLS -12.6%.  Herceptin was held, Coreg started, and echo in 6/21 showed EF up to 55-60% with GLS -14.4% => Herceptin restarted.  Echo in 7/21 with EF 55-60%, GLS more negative at -16.9%.  Echo in 2/22 and in 9/21 showed normal EF but less negative strain.  However, strain images did not appear to track well on either echo.  Echo in 4/22  showed EF 50-55%, again with poor strain tracking.  Echo in 7/22 and in 10/22 showed EF 55-60% with GLS -20.1% and mild LVH. Echo in 5/23 showed EF 55-60% with GLS -22.1%, normal RV.  Echo 11/23 was read as 50-55% but looked like 55% on my read, with GLS -19.8% and normal RV. With painful peripheral neuropathy and carpal tunnel syndrome as well as LVH with diastolic CHF, there was concern for possible cardiac amyloidosis.  Urine and serum immunofixations negative.  PYP scan in 3/24 was equivocal.  TTR gene testing was negative. cMRI 6/24 showed LVEF 53% and no  evidence of infiltrative process.  Echo in 11/24 showed EF 55%, mild LVH, normal RV.  NYHA class II-III symptoms, chronic pain and neuropathy contributory. She is not volume overloaded on exam.  - Continue Lasix 60 mg daily but needs to restart KCl 20 bid.  BMET/BNP today.  - Continue Coreg 6.25 mg bid.  - Continue telmisartan.   - She should be safe to continue Herceptin (plan for long-term use).  With stable echoes and long-term Herceptin use, we can continue to obtain echoes at 6 month intervals => will get the next echo in 5/25.  2. HTN: BP controlled.   3. DVT: on left.   - Continue Xarelto long-term given DVT in setting of malignancy.  4. Obesity: Difficult for her to exercise due to neuropathy, poor balance. Body mass index is 47.38 kg/m. - She is now on tirzepatide with weight trending down.  Follow up in 6 months with APP.   [name]  09/28/2023

## 2023-10-08 ENCOUNTER — Inpatient Hospital Stay (HOSPITAL_BASED_OUTPATIENT_CLINIC_OR_DEPARTMENT_OTHER): Payer: Medicare Other | Admitting: Adult Health

## 2023-10-08 ENCOUNTER — Inpatient Hospital Stay: Payer: Medicare Other | Attending: Hematology and Oncology

## 2023-10-08 ENCOUNTER — Inpatient Hospital Stay: Payer: Medicare Other

## 2023-10-08 VITALS — BP 146/76 | HR 68 | Resp 18

## 2023-10-08 VITALS — BP 154/70 | HR 82 | Temp 98.4°F | Resp 18 | Ht 59.0 in | Wt 239.9 lb

## 2023-10-08 DIAGNOSIS — Z86718 Personal history of other venous thrombosis and embolism: Secondary | ICD-10-CM | POA: Insufficient documentation

## 2023-10-08 DIAGNOSIS — Z8542 Personal history of malignant neoplasm of other parts of uterus: Secondary | ICD-10-CM | POA: Insufficient documentation

## 2023-10-08 DIAGNOSIS — Z171 Estrogen receptor negative status [ER-]: Secondary | ICD-10-CM | POA: Diagnosis not present

## 2023-10-08 DIAGNOSIS — C50112 Malignant neoplasm of central portion of left female breast: Secondary | ICD-10-CM

## 2023-10-08 DIAGNOSIS — Z5112 Encounter for antineoplastic immunotherapy: Secondary | ICD-10-CM | POA: Insufficient documentation

## 2023-10-08 DIAGNOSIS — Z95828 Presence of other vascular implants and grafts: Secondary | ICD-10-CM

## 2023-10-08 DIAGNOSIS — C50812 Malignant neoplasm of overlapping sites of left female breast: Secondary | ICD-10-CM

## 2023-10-08 DIAGNOSIS — C773 Secondary and unspecified malignant neoplasm of axilla and upper limb lymph nodes: Secondary | ICD-10-CM | POA: Diagnosis present

## 2023-10-08 DIAGNOSIS — Z7901 Long term (current) use of anticoagulants: Secondary | ICD-10-CM | POA: Insufficient documentation

## 2023-10-08 LAB — CBC WITH DIFFERENTIAL (CANCER CENTER ONLY)
Abs Immature Granulocytes: 0.02 10*3/uL (ref 0.00–0.07)
Basophils Absolute: 0 10*3/uL (ref 0.0–0.1)
Basophils Relative: 1 %
Eosinophils Absolute: 0.1 10*3/uL (ref 0.0–0.5)
Eosinophils Relative: 2 %
HCT: 30.4 % — ABNORMAL LOW (ref 36.0–46.0)
Hemoglobin: 9.6 g/dL — ABNORMAL LOW (ref 12.0–15.0)
Immature Granulocytes: 0 %
Lymphocytes Relative: 25 %
Lymphs Abs: 1.3 10*3/uL (ref 0.7–4.0)
MCH: 28.4 pg (ref 26.0–34.0)
MCHC: 31.6 g/dL (ref 30.0–36.0)
MCV: 89.9 fL (ref 80.0–100.0)
Monocytes Absolute: 0.5 10*3/uL (ref 0.1–1.0)
Monocytes Relative: 9 %
Neutro Abs: 3.3 10*3/uL (ref 1.7–7.7)
Neutrophils Relative %: 63 %
Platelet Count: 209 10*3/uL (ref 150–400)
RBC: 3.38 MIL/uL — ABNORMAL LOW (ref 3.87–5.11)
RDW: 13.9 % (ref 11.5–15.5)
WBC Count: 5.3 10*3/uL (ref 4.0–10.5)
nRBC: 0 % (ref 0.0–0.2)

## 2023-10-08 LAB — CMP (CANCER CENTER ONLY)
ALT: 13 U/L (ref 0–44)
AST: 16 U/L (ref 15–41)
Albumin: 3.6 g/dL (ref 3.5–5.0)
Alkaline Phosphatase: 46 U/L (ref 38–126)
Anion gap: 5 (ref 5–15)
BUN: 20 mg/dL (ref 8–23)
CO2: 27 mmol/L (ref 22–32)
Calcium: 9.5 mg/dL (ref 8.9–10.3)
Chloride: 110 mmol/L (ref 98–111)
Creatinine: 1.1 mg/dL — ABNORMAL HIGH (ref 0.44–1.00)
GFR, Estimated: 56 mL/min — ABNORMAL LOW (ref >60)
Glucose, Bld: 122 mg/dL — ABNORMAL HIGH (ref 70–99)
Potassium: 3.7 mmol/L (ref 3.5–5.1)
Sodium: 142 mmol/L (ref 135–145)
Total Bilirubin: 0.3 mg/dL (ref <1.2)
Total Protein: 7.2 g/dL (ref 6.5–8.1)

## 2023-10-08 MED ORDER — SODIUM CHLORIDE 0.9% FLUSH
10.0000 mL | INTRAVENOUS | Status: DC | PRN
  Administered 2023-10-08: 10 mL

## 2023-10-08 MED ORDER — SODIUM CHLORIDE 0.9 % IV SOLN: Freq: Once | INTRAVENOUS | Status: AC

## 2023-10-08 MED ORDER — TRASTUZUMAB-ANNS CHEMO 150 MG IV SOLR
6.0000 mg/kg | Freq: Once | INTRAVENOUS | Status: AC
  Administered 2023-10-08: 600 mg via INTRAVENOUS
  Filled 2023-10-08: qty 28.57

## 2023-10-08 MED ORDER — DIPHENHYDRAMINE HCL 25 MG PO CAPS
25.0000 mg | ORAL_CAPSULE | Freq: Once | ORAL | Status: AC
  Administered 2023-10-08: 25 mg via ORAL
  Filled 2023-10-08: qty 1

## 2023-10-08 MED ORDER — SODIUM CHLORIDE 0.9% FLUSH
10.0000 mL | Freq: Once | INTRAVENOUS | Status: AC
  Administered 2023-10-08: 10 mL

## 2023-10-08 MED ORDER — ACETAMINOPHEN 325 MG PO TABS
650.0000 mg | ORAL_TABLET | Freq: Once | ORAL | Status: AC
  Administered 2023-10-08: 650 mg via ORAL
  Filled 2023-10-08: qty 2

## 2023-10-08 MED ORDER — HEPARIN SOD (PORK) LOCK FLUSH 100 UNIT/ML IV SOLN
500.0000 [IU] | Freq: Once | INTRAVENOUS | Status: AC | PRN
  Administered 2023-10-08: 500 [IU]

## 2023-10-08 NOTE — Patient Instructions (Signed)
 [facility] - A DEPT OF [facility]  Discharge Instructions: Thank you for choosing [facility] to provide your oncology and hematology care.   If you have a lab appointment with the Cancer Center, please go directly to the Cancer Center and check in at the registration area.   Wear comfortable clothing and clothing appropriate for easy access to any Portacath or PICC line.   We strive to give you quality time with your provider. You may need to reschedule your appointment if you arrive late (15 or more minutes).  Arriving late affects you and other patients whose appointments are after yours.  Also, if you miss three or more appointments without notifying the office, you may be dismissed from the clinic at the provider's discretion.      For prescription refill requests, have your pharmacy contact our office and allow 72 hours for refills to be completed.    Today you received the following chemotherapy and/or immunotherapy agents Kanjinti      To help prevent nausea and vomiting after your treatment, we encourage you to take your nausea medication as directed.  BELOW ARE SYMPTOMS THAT SHOULD BE REPORTED IMMEDIATELY: *FEVER GREATER THAN 100.4 F (38 C) OR HIGHER *CHILLS OR SWEATING *NAUSEA AND VOMITING THAT IS NOT CONTROLLED WITH YOUR NAUSEA MEDICATION *UNUSUAL SHORTNESS OF BREATH *UNUSUAL BRUISING OR BLEEDING *URINARY PROBLEMS (pain or burning when urinating, or frequent urination) *BOWEL PROBLEMS (unusual diarrhea, constipation, pain near the anus) TENDERNESS IN MOUTH AND THROAT WITH OR WITHOUT PRESENCE OF ULCERS (sore throat, sores in mouth, or a toothache) UNUSUAL RASH, SWELLING OR PAIN  UNUSUAL VAGINAL DISCHARGE OR ITCHING   Items with * indicate a potential emergency and should be followed up as soon as possible or go to the Emergency Department if any problems should occur.  Please show the CHEMOTHERAPY ALERT CARD or IMMUNOTHERAPY  ALERT CARD at check-in to the Emergency Department and triage nurse.  Should you have questions after your visit or need to cancel or reschedule your appointment, please contact [facility] - A DEPT OF [facility]  Dept: [phone]  and follow the prompts.  Office hours are 8:00 a.m. to 4:30 p.m. Monday - Friday. Please note that voicemails left after 4:00 p.m. may not be returned until the following business day.  We are closed weekends and major holidays. You have access to a nurse at all times for urgent questions. Please call the main number to the clinic Dept: [phone] and follow the prompts.   For any non-urgent questions, you may also contact your provider using MyChart. We now offer e-Visits for anyone 69 and older to request care online for non-urgent symptoms. For details visit [email or web address].   Also download the MyChart app! Go to the app store, search "MyChart", open the app, select [place], and log in with your MyChart username and password.

## 2023-10-08 NOTE — Assessment & Plan Note (Signed)
Metastatic Breast Cancer Stable blood counts. Last PET scan in August 2024. New onset shortness of breath that began today without associated symptoms -Consider early PET scan or CT scan if shortness of breath persists in absence of infection s/s -Ambulatory oxygen saturation tested in clinic and maintained at level of 100%.  -Continue with Herceptin therapy.    Heart Failure Recent echo shows LVEF 55% and normal global longitudinal strain. Patient reports feeling well on current medications. -Continue Lasix with potassium/magnesium BID, Coreg BID, and Telmisartan daily  Muscle Cramps Patient reports frequent hand cramping and difficulty straightening fingers. Currently on potassium and magnesium supplements. -Continue potassium and magnesium supplements as prescribed by Dr. Shirlee Latch.  Lower Back and Hip Pain Pain reported with walking.  -Continue current management.  RTC in 3 weeks for labs, f/u, and next treatment.

## 2023-10-08 NOTE — Progress Notes (Signed)
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 cancer Neg Hx    Colon polyps Neg Hx    Esophageal cancer Neg Hx    Stomach cancer Neg Hx    Rectal cancer Neg Hx     Review of Systems  Constitutional:  Negative for appetite change, chills, fatigue, fever and unexpected weight change.  HENT:   Negative for hearing loss, lump/mass and trouble swallowing.   Eyes:  Negative for eye problems and icterus.  Respiratory:  Negative for chest tightness, cough and shortness of breath.   Cardiovascular:  Negative for chest pain, leg swelling and palpitations.  Gastrointestinal:  Negative for abdominal distention, abdominal pain, constipation, diarrhea, nausea and vomiting.  Endocrine: Negative for hot flashes.  Genitourinary:  Negative for difficulty urinating.   Musculoskeletal:  Negative for arthralgias.  Skin:  Negative for itching and rash.  Neurological:  Negative for dizziness, extremity weakness, headaches  and numbness.  Hematological:  Negative for adenopathy. Does not bruise/bleed easily.  Psychiatric/Behavioral:  Negative for depression. The patient is not nervous/anxious.       PHYSICAL EXAMINATION   Onc Performance Status - 10/08/23 1200       KPS SCALE   KPS % SCORE Normal activity with effort, some s/s of disease             Vitals:   10/08/23 1216 10/08/23 1307  BP: (!) 154/70   Pulse: 82   Resp: 18   Temp: 98.4 F (36.9 C)   SpO2: 100% 100%    Physical Exam Constitutional:      General: She is not in acute distress.    Appearance: Normal appearance. She is not toxic-appearing.  HENT:     Head: Normocephalic and atraumatic.     Mouth/Throat:     Mouth: Mucous membranes are moist.     Pharynx: Oropharynx is clear. No oropharyngeal exudate or posterior oropharyngeal erythema.  Eyes:     General: No scleral icterus. Cardiovascular:     Rate and Rhythm: Normal rate and regular rhythm.     Pulses: Normal pulses.     Heart sounds:  Normal heart sounds.  Pulmonary:     Effort: Pulmonary effort is normal.     Breath sounds: Normal breath sounds.  Abdominal:     General: Abdomen is flat. Bowel sounds are normal. There is no distension.     Palpations: Abdomen is soft.     Tenderness: There is no abdominal tenderness.  Musculoskeletal:        General: No swelling.     Cervical back: Neck supple.  Lymphadenopathy:     Cervical: No cervical adenopathy.  Skin:    General: Skin is warm and dry.     Findings: No rash.  Neurological:     General: No focal deficit present.     Mental Status: She is alert.  Psychiatric:        Mood and Affect: Mood normal.        Behavior: Behavior normal.     LABORATORY DATA:  CBC    Component Value Date/Time   WBC 5.3 10/08/2023 1155   WBC 6.4 09/27/2020 1114   RBC 3.38 (L) 10/08/2023 1155   HGB 9.6 (L) 10/08/2023 1155   HGB 11.9 03/30/2017 1044   HCT 30.4 (L) 10/08/2023 1155   HCT 37.4 03/30/2017 1044   PLT 209  10/08/2023 1155   PLT 238 03/30/2017 1044   MCV 89.9 10/08/2023 1155   MCV 85.0 03/30/2017 1044   MCH 28.4 10/08/2023 1155   MCHC 31.6 10/08/2023 1155   RDW 13.9 10/08/2023 1155   RDW 15.1 (H) 03/30/2017 1044   LYMPHSABS 1.3 10/08/2023 1155   LYMPHSABS 1.3 03/30/2017 1044   MONOABS 0.5 10/08/2023 1155   MONOABS 0.5 03/30/2017 1044   EOSABS 0.1 10/08/2023 1155   EOSABS 0.0 03/30/2017 1044   BASOSABS 0.0 10/08/2023 1155   BASOSABS 0.0 03/30/2017 1044    CMP     Component Value Date/Time   NA 142 10/08/2023 1155   NA 143 03/30/2017 1044   K 3.7 10/08/2023 1155   K 3.8 03/30/2017 1044   CL 110 10/08/2023 1155   CL 106 01/18/2013 0909   CO2 27 10/08/2023 1155   CO2 27 03/30/2017 1044   GLUCOSE 122 (H) 10/08/2023 1155   GLUCOSE 91 03/30/2017 1044   GLUCOSE 99 01/18/2013 0909   BUN 20 10/08/2023 1155   BUN 9.7 03/30/2017 1044   CREATININE 1.10 (H) 10/08/2023 1155   CREATININE 0.8 03/30/2017 1044   CALCIUM 9.5 10/08/2023 1155   CALCIUM 9.7 03/30/2017 1044   PROT 7.2 10/08/2023 1155   PROT 8.0 03/30/2017 1044   ALBUMIN 3.6 10/08/2023 1155   ALBUMIN 3.8 01/27/2021 1232   ALBUMIN 3.4 (L) 03/30/2017 1044   AST 16 10/08/2023 1155   AST 17 03/30/2017 1044   ALT 13 10/08/2023 1155   ALT 15 03/30/2017 1044   ALKPHOS 46 10/08/2023 1155   ALKPHOS 60 03/30/2017 1044   BILITOT 0.3 10/08/2023 1155   BILITOT 0.34 03/30/2017 1044   GFRNONAA 56 (L) 10/08/2023 1155   GFRAA >60 07/11/2020 0923      ASSESSMENT and THERAPY PLAN:   Malignant neoplasm of central portion of left breast in female, estrogen receptor negative (HCC) Metastatic Breast Cancer Stable blood counts. Last PET scan in August 2024. New onset shortness of breath that began today without associated symptoms -Consider early PET scan or CT scan if shortness of breath persists in absence of infection s/s -Ambulatory oxygen saturation tested in clinic and maintained at level of 100%.  -Continue with Herceptin therapy.  Heart Failure Recent echo shows LVEF 55% and normal global longitudinal strain. Patient reports feeling well on current medications. -Continue Lasix with potassium/magnesium BID, Coreg BID, and Telmisartan daily  Muscle Cramps Patient reports frequent hand cramping and difficulty straightening fingers. Currently on potassium and magnesium supplements. -Continue potassium and magnesium supplements as prescribed by Dr. Shirlee Latch.  Lower Back and Hip Pain Pain reported with walking.  -Continue current management.  RTC in 3 weeks for labs, f/u, and next treatment.      All questions were answered. The patient knows to call the clinic with any problems, questions or concerns. We can certainly see the patient much sooner if necessary.  Total encounter time:30 minutes*in face-to-face visit time, chart review, lab review, care coordination, order entry, and documentation of the encounter time.    Lillard Anes, NP 10/08/23 1:54 PM Medical Oncology and Hematology [facility] [address] [place], [place] [address] Tel. [phone]    Fax. [phone]  *Total Encounter Time as defined by the Centers for Medicare and Medicaid Services includes, in addition to the face-to-face time of a patient visit (documented in the note above) non-face-to-face time: obtaining and reviewing outside history, ordering and reviewing medications, tests or procedures, care coordination (communications with other health care professionals or caregivers) and documentation in the medical record.

## 2023-10-29 ENCOUNTER — Encounter: Payer: Self-pay | Admitting: Hematology and Oncology

## 2023-10-29 ENCOUNTER — Inpatient Hospital Stay (HOSPITAL_BASED_OUTPATIENT_CLINIC_OR_DEPARTMENT_OTHER): Payer: Medicare Other | Attending: Hematology and Oncology | Admitting: Hematology and Oncology

## 2023-10-29 ENCOUNTER — Inpatient Hospital Stay: Payer: Medicare Other

## 2023-10-29 ENCOUNTER — Inpatient Hospital Stay: Payer: Medicare Other | Attending: Hematology and Oncology

## 2023-10-29 VITALS — BP 132/71 | HR 66 | Temp 97.3°F | Resp 18 | Wt 232.6 lb

## 2023-10-29 DIAGNOSIS — C50812 Malignant neoplasm of overlapping sites of left female breast: Secondary | ICD-10-CM

## 2023-10-29 DIAGNOSIS — Z5112 Encounter for antineoplastic immunotherapy: Secondary | ICD-10-CM | POA: Diagnosis present

## 2023-10-29 DIAGNOSIS — Z8542 Personal history of malignant neoplasm of other parts of uterus: Secondary | ICD-10-CM | POA: Insufficient documentation

## 2023-10-29 DIAGNOSIS — Z86718 Personal history of other venous thrombosis and embolism: Secondary | ICD-10-CM | POA: Insufficient documentation

## 2023-10-29 DIAGNOSIS — C50112 Malignant neoplasm of central portion of left female breast: Secondary | ICD-10-CM | POA: Diagnosis not present

## 2023-10-29 DIAGNOSIS — C773 Secondary and unspecified malignant neoplasm of axilla and upper limb lymph nodes: Secondary | ICD-10-CM | POA: Insufficient documentation

## 2023-10-29 DIAGNOSIS — Z171 Estrogen receptor negative status [ER-]: Secondary | ICD-10-CM

## 2023-10-29 DIAGNOSIS — Z95828 Presence of other vascular implants and grafts: Secondary | ICD-10-CM

## 2023-10-29 LAB — CBC WITH DIFFERENTIAL (CANCER CENTER ONLY)
Abs Immature Granulocytes: 0.02 10*3/uL (ref 0.00–0.07)
Basophils Absolute: 0 10*3/uL (ref 0.0–0.1)
Basophils Relative: 1 %
Eosinophils Absolute: 0.1 10*3/uL (ref 0.0–0.5)
Eosinophils Relative: 1 %
HCT: 33.3 % — ABNORMAL LOW (ref 36.0–46.0)
Hemoglobin: 10.8 g/dL — ABNORMAL LOW (ref 12.0–15.0)
Immature Granulocytes: 0 %
Lymphocytes Relative: 24 %
Lymphs Abs: 1.6 10*3/uL (ref 0.7–4.0)
MCH: 28.3 pg (ref 26.0–34.0)
MCHC: 32.4 g/dL (ref 30.0–36.0)
MCV: 87.2 fL (ref 80.0–100.0)
Monocytes Absolute: 0.7 10*3/uL (ref 0.1–1.0)
Monocytes Relative: 11 %
Neutro Abs: 4.3 10*3/uL (ref 1.7–7.7)
Neutrophils Relative %: 63 %
Platelet Count: 227 10*3/uL (ref 150–400)
RBC: 3.82 MIL/uL — ABNORMAL LOW (ref 3.87–5.11)
RDW: 14.1 % (ref 11.5–15.5)
WBC Count: 6.8 10*3/uL (ref 4.0–10.5)
nRBC: 0 % (ref 0.0–0.2)

## 2023-10-29 LAB — CMP (CANCER CENTER ONLY)
ALT: 13 U/L (ref 0–44)
AST: 17 U/L (ref 15–41)
Albumin: 3.9 g/dL (ref 3.5–5.0)
Alkaline Phosphatase: 51 U/L (ref 38–126)
Anion gap: 8 (ref 5–15)
BUN: 35 mg/dL — ABNORMAL HIGH (ref 8–23)
CO2: 28 mmol/L (ref 22–32)
Calcium: 9.6 mg/dL (ref 8.9–10.3)
Chloride: 102 mmol/L (ref 98–111)
Creatinine: 1.3 mg/dL — ABNORMAL HIGH (ref 0.44–1.00)
GFR, Estimated: 46 mL/min — ABNORMAL LOW (ref >60)
Glucose, Bld: 96 mg/dL (ref 70–99)
Potassium: 3.9 mmol/L (ref 3.5–5.1)
Sodium: 138 mmol/L (ref 135–145)
Total Bilirubin: 0.4 mg/dL (ref 0.0–1.2)
Total Protein: 8.2 g/dL — ABNORMAL HIGH (ref 6.5–8.1)

## 2023-10-29 MED ORDER — ACETAMINOPHEN 325 MG PO TABS
650.0000 mg | ORAL_TABLET | Freq: Once | ORAL | Status: AC
  Administered 2023-10-29: 650 mg via ORAL
  Filled 2023-10-29: qty 2

## 2023-10-29 MED ORDER — SODIUM CHLORIDE 0.9% FLUSH
10.0000 mL | INTRAVENOUS | Status: DC | PRN
  Administered 2023-10-29: 10 mL

## 2023-10-29 MED ORDER — HEPARIN SOD (PORK) LOCK FLUSH 100 UNIT/ML IV SOLN
500.0000 [IU] | Freq: Once | INTRAVENOUS | Status: AC | PRN
  Administered 2023-10-29: 500 [IU]

## 2023-10-29 MED ORDER — DIPHENHYDRAMINE HCL 25 MG PO CAPS
25.0000 mg | ORAL_CAPSULE | Freq: Once | ORAL | Status: AC
  Administered 2023-10-29: 25 mg via ORAL
  Filled 2023-10-29: qty 1

## 2023-10-29 MED ORDER — SODIUM CHLORIDE 0.9 % IV SOLN
6.0000 mg/kg | Freq: Once | INTRAVENOUS | Status: AC
  Administered 2023-10-29: 600 mg via INTRAVENOUS
  Filled 2023-10-29: qty 28.57

## 2023-10-29 MED ORDER — SODIUM CHLORIDE 0.9 % IV SOLN: Freq: Once | INTRAVENOUS | Status: AC

## 2023-10-29 MED ORDER — SODIUM CHLORIDE 0.9% FLUSH
10.0000 mL | Freq: Once | INTRAVENOUS | Status: AC
  Administered 2023-10-29: 10 mL

## 2023-10-29 MED ORDER — GABAPENTIN 300 MG PO CAPS: 600.0000 mg | ORAL_CAPSULE | Freq: Every evening | ORAL | 1 refills | Status: AC | PRN

## 2023-10-29 NOTE — Patient Instructions (Signed)
 [facility] - A DEPT OF [facility]  Discharge Instructions: Thank you for choosing [facility] to provide your oncology and hematology care.   If you have a lab appointment with the Cancer Center, please go directly to the Cancer Center and check in at the registration area.   Wear comfortable clothing and clothing appropriate for easy access to any Portacath or PICC line.   We strive to give you quality time with your provider. You may need to reschedule your appointment if you arrive late (15 or more minutes).  Arriving late affects you and other patients whose appointments are after yours.  Also, if you miss three or more appointments without notifying the office, you may be dismissed from the clinic at the provider's discretion.      For prescription refill requests, have your pharmacy contact our office and allow 72 hours for refills to be completed.    Today you received the following chemotherapy and/or immunotherapy agents: Kanjinti    To help prevent nausea and vomiting after your treatment, we encourage you to take your nausea medication as directed.  BELOW ARE SYMPTOMS THAT SHOULD BE REPORTED IMMEDIATELY: *FEVER GREATER THAN 100.4 F (38 C) OR HIGHER *CHILLS OR SWEATING *NAUSEA AND VOMITING THAT IS NOT CONTROLLED WITH YOUR NAUSEA MEDICATION *UNUSUAL SHORTNESS OF BREATH *UNUSUAL BRUISING OR BLEEDING *URINARY PROBLEMS (pain or burning when urinating, or frequent urination) *BOWEL PROBLEMS (unusual diarrhea, constipation, pain near the anus) TENDERNESS IN MOUTH AND THROAT WITH OR WITHOUT PRESENCE OF ULCERS (sore throat, sores in mouth, or a toothache) UNUSUAL RASH, SWELLING OR PAIN  UNUSUAL VAGINAL DISCHARGE OR ITCHING   Items with * indicate a potential emergency and should be followed up as soon as possible or go to the Emergency Department if any problems should occur.  Please show the CHEMOTHERAPY ALERT CARD or IMMUNOTHERAPY  ALERT CARD at check-in to the Emergency Department and triage nurse.  Should you have questions after your visit or need to cancel or reschedule your appointment, please contact [facility] - A DEPT OF [facility]  Dept: [phone]  and follow the prompts.  Office hours are 8:00 a.m. to 4:30 p.m. Monday - Friday. Please note that voicemails left after 4:00 p.m. may not be returned until the following business day.  We are closed weekends and major holidays. You have access to a nurse at all times for urgent questions. Please call the main number to the clinic Dept: [phone] and follow the prompts.   For any non-urgent questions, you may also contact your provider using MyChart. We now offer e-Visits for anyone 47 and older to request care online for non-urgent symptoms. For details visit [email or web address].   Also download the MyChart app! Go to the app store, search MyChart, open the app, select , and log in with your MyChart username and password.

## 2023-10-29 NOTE — Progress Notes (Signed)
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 icterus.  Respiratory:  Negative for chest tightness, cough and shortness of breath.   Cardiovascular:  Negative for chest pain, leg swelling and palpitations.  Gastrointestinal:  Negative for abdominal distention, abdominal pain, constipation, diarrhea, nausea and vomiting.  Endocrine: Negative for hot flashes.  Genitourinary:  Negative for difficulty urinating.   Musculoskeletal:  Negative for arthralgias.  Skin:  Negative for itching and rash.  Neurological:  Negative for dizziness, extremity weakness, headaches and numbness.  Hematological:  Negative for adenopathy. Does not bruise/bleed easily.  Psychiatric/Behavioral:  Negative for depression. The patient is not nervous/anxious.       PHYSICAL EXAMINATION     Vitals:   10/29/23 0935  BP: 132/71  Pulse: 66  Resp: 18  Temp: (!) 97.3 F (36.3 C)  SpO2: 95%    Physical Exam Constitutional:      General: She is not in  acute distress.    Appearance: Normal appearance. She is not toxic-appearing.  HENT:     Head: Normocephalic and atraumatic.     Mouth/Throat:     Mouth: Mucous membranes are moist.     Pharynx: Oropharynx is clear. No oropharyngeal exudate or posterior oropharyngeal erythema.  Eyes:     General: No scleral icterus. Cardiovascular:     Rate and Rhythm: Normal rate and regular rhythm.     Pulses: Normal pulses.     Heart sounds: Normal heart sounds.  Pulmonary:     Effort: Pulmonary effort is normal.     Breath sounds: Normal breath sounds.  Abdominal:     General: Abdomen is flat. Bowel sounds are normal. There is no distension.     Palpations: Abdomen is soft.     Tenderness: There is no abdominal tenderness.  Musculoskeletal:        General: No swelling.     Cervical back: Neck supple.  Lymphadenopathy:     Cervical: No cervical adenopathy.  Skin:    General: Skin is warm and dry.  Findings: No rash.  Neurological:     General: No focal deficit present.     Mental Status: She is alert.  Psychiatric:        Mood and Affect: Mood normal.        Behavior: Behavior normal.     LABORATORY DATA:  CBC    Component Value Date/Time   WBC 6.8 10/29/2023 0908   WBC 6.4 09/27/2020 1114   RBC 3.82 (L) 10/29/2023 0908   HGB 10.8 (L) 10/29/2023 0908   HGB 11.9 03/30/2017 1044   HCT 33.3 (L) 10/29/2023 0908   HCT 37.4 03/30/2017 1044   PLT 227 10/29/2023 0908   PLT 238 03/30/2017 1044   MCV 87.2 10/29/2023 0908   MCV 85.0 03/30/2017 1044   MCH 28.3 10/29/2023 0908   MCHC 32.4 10/29/2023 0908   RDW 14.1 10/29/2023 0908   RDW 15.1 (H) 03/30/2017 1044   LYMPHSABS 1.6 10/29/2023 0908   LYMPHSABS 1.3 03/30/2017 1044   MONOABS 0.7 10/29/2023 0908   MONOABS 0.5 03/30/2017 1044   EOSABS 0.1 10/29/2023 0908   EOSABS 0.0 03/30/2017 1044   BASOSABS 0.0 10/29/2023 0908   BASOSABS 0.0 03/30/2017 1044    CMP     Component Value Date/Time   NA 138 10/29/2023 0908   NA 143  03/30/2017 1044   K 3.9 10/29/2023 0908   K 3.8 03/30/2017 1044   CL 102 10/29/2023 0908   CL 106 01/18/2013 0909   CO2 28 10/29/2023 0908   CO2 27 03/30/2017 1044   GLUCOSE 96 10/29/2023 0908   GLUCOSE 91 03/30/2017 1044   GLUCOSE 99 01/18/2013 0909   BUN 35 (H) 10/29/2023 0908   BUN 9.7 03/30/2017 1044   CREATININE 1.30 (H) 10/29/2023 0908   CREATININE 0.8 03/30/2017 1044   CALCIUM  9.6 10/29/2023 0908   CALCIUM  9.7 03/30/2017 1044   PROT 8.2 (H) 10/29/2023 0908   PROT 8.0 03/30/2017 1044   ALBUMIN 3.9 10/29/2023 0908   ALBUMIN 3.8 01/27/2021 1232   ALBUMIN 3.4 (L) 03/30/2017 1044   AST 17 10/29/2023 0908   AST 17 03/30/2017 1044   ALT 13 10/29/2023 0908   ALT 15 03/30/2017 1044   ALKPHOS 51 10/29/2023 0908   ALKPHOS 60 03/30/2017 1044   BILITOT 0.4 10/29/2023 0908   BILITOT 0.34 03/30/2017 1044   GFRNONAA 46 (L) 10/29/2023 0908   GFRAA >60 07/11/2020 0923      ASSESSMENT and THERAPY PLAN:   Malignant neoplasm of central portion of left breast in female, estrogen receptor negative (HCC) Muscle Cramps Persistent cramping in both legs and abdomen, with episodes of foot contortion. Possible association with neuropathy. Patient has been inconsistent with gabapentin  use. -Resume regular use of gabapentin . -Continue potassium and magnesium  supplements. -Stay hydrated, consider continuing coconut water .  Neuropathy Unclear if current cramping episodes are related to neuropathy. Patient has been inconsistent with gabapentin  use. -Resume regular use of gabapentin .  Breast Cancer Patient has been stable on Herceptin . No recent complaints related to cancer. -Continue Herceptin . -Order PET scan for February 2025.  General Health Maintenance -Stay hydrated. -Continue potassium and magnesium  supplements. -Consider use of moisturizing soap and lotion for dry skin.    All questions were answered. The patient knows to call the clinic with any problems, questions or  concerns. We can certainly see the patient much sooner if necessary.  Total encounter time:30 minutes*in face-to-face visit time, chart review, lab review, care coordination, order entry, and documentation of the encounter time.   *  Total Encounter Time as defined by the Centers for Medicare and Medicaid Services includes, in addition to the face-to-face time of a patient visit (documented in the note above) non-face-to-face time: obtaining and reviewing outside history, ordering and reviewing medications, tests or procedures, care coordination (communications with other health care professionals or caregivers) and documentation in the medical record.

## 2023-10-29 NOTE — Assessment & Plan Note (Addendum)
 Muscle Cramps Persistent cramping in both legs and abdomen, with episodes of foot contortion. Possible association with neuropathy. Patient has been inconsistent with gabapentin  use. -Resume regular use of gabapentin . -Continue potassium and magnesium  supplements. -Stay hydrated, consider continuing coconut water .  Neuropathy Unclear if current cramping episodes are related to neuropathy. Patient has been inconsistent with gabapentin  use. -Resume regular use of gabapentin .  Breast Cancer Patient has been stable on Herceptin . No recent complaints related to cancer. -Continue Herceptin . -Order PET scan for February 2025.  General Health Maintenance -Stay hydrated. -Continue potassium and magnesium  supplements. -Consider use of moisturizing soap and lotion for dry skin.

## 2023-11-01 ENCOUNTER — Ambulatory Visit: Payer: Medicare Other | Admitting: Internal Medicine

## 2023-11-09 ENCOUNTER — Encounter: Payer: Self-pay | Admitting: Internal Medicine

## 2023-11-09 ENCOUNTER — Ambulatory Visit (INDEPENDENT_AMBULATORY_CARE_PROVIDER_SITE_OTHER): Payer: Medicare Other | Admitting: Internal Medicine

## 2023-11-09 VITALS — BP 132/78 | Temp 97.8°F | Ht 59.0 in | Wt 242.0 lb

## 2023-11-09 DIAGNOSIS — Z0001 Encounter for general adult medical examination with abnormal findings: Secondary | ICD-10-CM

## 2023-11-09 DIAGNOSIS — I7300 Raynaud's syndrome without gangrene: Secondary | ICD-10-CM

## 2023-11-09 DIAGNOSIS — I10 Essential (primary) hypertension: Secondary | ICD-10-CM

## 2023-11-09 DIAGNOSIS — Z0000 Encounter for general adult medical examination without abnormal findings: Secondary | ICD-10-CM | POA: Diagnosis not present

## 2023-11-09 DIAGNOSIS — E7800 Pure hypercholesterolemia, unspecified: Secondary | ICD-10-CM

## 2023-11-09 DIAGNOSIS — E119 Type 2 diabetes mellitus without complications: Secondary | ICD-10-CM

## 2023-11-09 DIAGNOSIS — E559 Vitamin D deficiency, unspecified: Secondary | ICD-10-CM | POA: Diagnosis not present

## 2023-11-09 DIAGNOSIS — Z7985 Long-term (current) use of injectable non-insulin antidiabetic drugs: Secondary | ICD-10-CM

## 2023-11-09 DIAGNOSIS — F32A Depression, unspecified: Secondary | ICD-10-CM

## 2023-11-09 DIAGNOSIS — E538 Deficiency of other specified B group vitamins: Secondary | ICD-10-CM

## 2023-11-09 DIAGNOSIS — F419 Anxiety disorder, unspecified: Secondary | ICD-10-CM

## 2023-11-09 LAB — MICROALBUMIN / CREATININE URINE RATIO
Creatinine,U: 136.4 mg/dL
Microalb Creat Ratio: 0.5 mg/g (ref 0.0–30.0)
Microalb, Ur: <0.7 mg/dL (ref 0.0–1.9)

## 2023-11-09 LAB — HEMOGLOBIN A1C: Hgb A1c MFr Bld: 6.2 % (ref 4.6–6.5)

## 2023-11-09 LAB — LIPID PANEL
Cholesterol: 206 mg/dL — ABNORMAL HIGH (ref 0–200)
HDL: 76.9 mg/dL (ref >39.00)
LDL Cholesterol: 114 mg/dL — ABNORMAL HIGH (ref 0–99)
NonHDL: 129.39
Total CHOL/HDL Ratio: 3
Triglycerides: 79 mg/dL (ref 0.0–149.0)
VLDL: 15.8 mg/dL (ref 0.0–40.0)

## 2023-11-09 LAB — URINALYSIS, ROUTINE W REFLEX MICROSCOPIC
Bilirubin Urine: NEGATIVE
Ketones, ur: NEGATIVE
Nitrite: NEGATIVE
Specific Gravity, Urine: 1.025 (ref 1.000–1.030)
Total Protein, Urine: NEGATIVE
Urine Glucose: NEGATIVE
Urobilinogen, UA: 0.2 (ref 0.0–1.0)
pH: 6 (ref 5.0–8.0)

## 2023-11-09 LAB — TSH: TSH: 2.09 u[IU]/mL (ref 0.35–5.50)
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
## 2023-11-09 NOTE — Patient Instructions (Signed)
You will be contacted regarding the referral for: Cristy our pharmacist to answer medication questions  Please use warm water for the hands if the fingers turn blue in the cold weather  Please restart the magnesium as this likely will help the finger and muscle spasms  Please continue all other medications as before, and refills have been done if requested.  Please have the pharmacy call with any other refills you may need.  Please continue your efforts at being more active, low cholesterol diet, and weight control.  You are otherwise up to date with prevention measures today.  Please keep your appointments with your specialists as you may have planned  Please go to the LAB at the blood drawing area for the tests to be done  You will be contacted by phone if any changes need to be made immediately.  Otherwise, you will receive a letter about your results with an explanation, but please check with MyChart first.  Please make an Appointment to return in 6 months, or sooner if needed

## 2023-11-10 ENCOUNTER — Encounter: Payer: Self-pay | Admitting: Internal Medicine

## 2023-11-10 NOTE — Progress Notes (Signed)
The test results show that your current treatment is OK, as the tests are stable.  Please continue the same plan.  There is no other need for change of treatment or further evaluation based on these results, at this time.  thanks 

## 2023-11-12 ENCOUNTER — Encounter: Payer: Self-pay | Admitting: Internal Medicine

## 2023-11-12 DIAGNOSIS — E538 Deficiency of other specified B group vitamins: Secondary | ICD-10-CM | POA: Insufficient documentation

## 2023-11-12 DIAGNOSIS — I7300 Raynaud's syndrome without gangrene: Secondary | ICD-10-CM | POA: Insufficient documentation

## 2023-11-12 NOTE — Assessment & Plan Note (Signed)
Last vitamin D Lab Results  Component Value Date   VD25OH 37.66 07/10/2022   Low, to start oral replacement

## 2023-11-12 NOTE — Assessment & Plan Note (Signed)
Chronic persistent tx resistant, declines further tx at this time

## 2023-11-12 NOTE — Assessment & Plan Note (Signed)
Lab Results  Component Value Date   LDLCALC 114 (H) 11/09/2023   uncontrolled, pt for lower chol diet, declines statin or other for now

## 2023-11-12 NOTE — Assessment & Plan Note (Signed)
Age and sex appropriate education and counseling updated with regular exercise and diet Referrals for preventative services - declines dxa for now Immunizations addressed - declines covid booster, pneumovasc, shingrix Smoking counseling  - none needed Evidence for depression or other mood disorder - chronic tx resistant depression persists Most recent labs reviewed. I have personally reviewed and have noted: 1) the patient's medical and social history 2) The patient's current medications and supplements 3) The patient's height, weight, and BMI have been recorded in the chart

## 2023-11-12 NOTE — Assessment & Plan Note (Signed)
Pt encouraged for gloves in cold weather

## 2023-11-12 NOTE — Assessment & Plan Note (Signed)
BP Readings from Last 3 Encounters:  11/09/23 132/78  10/29/23 132/71  10/08/23 (!) 146/76   Stable, pt to continue medical treatment coreg 6.25 bid, micardis hcdt 80 12.5 qd

## 2023-11-12 NOTE — Assessment & Plan Note (Signed)
Lab Results  Component Value Date   HGBA1C 6.2 11/09/2023   Stable, pt to continue current medical treatment mounjaro 12.5 every day - pt encouraged for compliance

## 2023-11-12 NOTE — Assessment & Plan Note (Signed)
Pt to restart magnesium oral

## 2023-11-12 NOTE — Assessment & Plan Note (Signed)
Lab Results  Component Value Date   VITAMINB12 >1500 (H) 07/10/2022   Stable, cont oral replacement - b12 1000 mcg qd

## 2023-11-15 ENCOUNTER — Telehealth: Payer: Self-pay

## 2023-11-15 NOTE — Progress Notes (Signed)
Care Guide Pharmacy Note  11/15/2023 Name: [name] MRN: [id number] DOB: 15-Apr-1956  Referred By: [name], MD Reason for referral: Care Coordination (Outreach to schedule with Pharm d)   [name] is a 68 y.o. year old female who is a primary care patient of [name], MD.  [name] was referred to the pharmacist for assistance related to: DMII  Successful contact was made with the patient to discuss pharmacy services including being ready for the pharmacist to call at least 5 minutes before the scheduled appointment time and to have medication bottles and any blood pressure readings ready for review. The patient agreed to meet with the pharmacist via telephone visit on (date/time).11/30/2023  Penne Lash , RMA     [place]  [facility], [facility] Guide  Direct Dial: [phone]  Website: [email or web address]

## 2023-11-19 ENCOUNTER — Inpatient Hospital Stay: Payer: Medicare Other

## 2023-11-19 VITALS — BP 146/81 | HR 73 | Temp 98.6°F | Resp 18 | Wt 244.5 lb

## 2023-11-19 DIAGNOSIS — Z5112 Encounter for antineoplastic immunotherapy: Secondary | ICD-10-CM | POA: Diagnosis not present

## 2023-11-19 DIAGNOSIS — C50112 Malignant neoplasm of central portion of left female breast: Secondary | ICD-10-CM

## 2023-11-19 DIAGNOSIS — Z95828 Presence of other vascular implants and grafts: Secondary | ICD-10-CM

## 2023-11-19 DIAGNOSIS — Z171 Estrogen receptor negative status [ER-]: Secondary | ICD-10-CM

## 2023-11-19 LAB — CBC WITH DIFFERENTIAL (CANCER CENTER ONLY)
Abs Immature Granulocytes: 0.03 10*3/uL (ref 0.00–0.07)
Basophils Absolute: 0 10*3/uL (ref 0.0–0.1)
Basophils Relative: 1 %
Eosinophils Absolute: 0.1 10*3/uL (ref 0.0–0.5)
Eosinophils Relative: 2 %
HCT: 30.3 % — ABNORMAL LOW (ref 36.0–46.0)
Hemoglobin: 9.5 g/dL — ABNORMAL LOW (ref 12.0–15.0)
Immature Granulocytes: 1 %
Lymphocytes Relative: 24 %
Lymphs Abs: 1.4 10*3/uL (ref 0.7–4.0)
MCH: 28.5 pg (ref 26.0–34.0)
MCHC: 31.4 g/dL (ref 30.0–36.0)
MCV: 91 fL (ref 80.0–100.0)
Monocytes Absolute: 0.7 10*3/uL (ref 0.1–1.0)
Monocytes Relative: 12 %
Neutro Abs: 3.5 10*3/uL (ref 1.7–7.7)
Neutrophils Relative %: 60 %
Platelet Count: 216 10*3/uL (ref 150–400)
RBC: 3.33 MIL/uL — ABNORMAL LOW (ref 3.87–5.11)
RDW: 14.6 % (ref 11.5–15.5)
WBC Count: 5.8 10*3/uL (ref 4.0–10.5)
nRBC: 0 % (ref 0.0–0.2)

## 2023-11-19 LAB — CMP (CANCER CENTER ONLY)
ALT: 15 U/L (ref 0–44)
AST: 18 U/L (ref 15–41)
Albumin: 3.6 g/dL (ref 3.5–5.0)
Alkaline Phosphatase: 44 U/L (ref 38–126)
Anion gap: 6 (ref 5–15)
BUN: 27 mg/dL — ABNORMAL HIGH (ref 8–23)
CO2: 28 mmol/L (ref 22–32)
Calcium: 9.4 mg/dL (ref 8.9–10.3)
Chloride: 106 mmol/L (ref 98–111)
Creatinine: 1.08 mg/dL — ABNORMAL HIGH (ref 0.44–1.00)
GFR, Estimated: 57 mL/min — ABNORMAL LOW (ref >60)
Glucose, Bld: 122 mg/dL — ABNORMAL HIGH (ref 70–99)
Potassium: 3.9 mmol/L (ref 3.5–5.1)
Sodium: 140 mmol/L (ref 135–145)
Total Bilirubin: 0.4 mg/dL (ref 0.0–1.2)
Total Protein: 7.5 g/dL (ref 6.5–8.1)

## 2023-11-19 MED ORDER — ACETAMINOPHEN 325 MG PO TABS
650.0000 mg | ORAL_TABLET | Freq: Once | ORAL | Status: AC
  Administered 2023-11-19: 650 mg via ORAL
  Filled 2023-11-19: qty 2

## 2023-11-19 MED ORDER — TRASTUZUMAB-ANNS CHEMO 150 MG IV SOLR
6.0000 mg/kg | Freq: Once | INTRAVENOUS | Status: AC
  Administered 2023-11-19: 600 mg via INTRAVENOUS
  Filled 2023-11-19: qty 28.57

## 2023-11-19 MED ORDER — SODIUM CHLORIDE 0.9 % IV SOLN: Freq: Once | INTRAVENOUS | Status: AC

## 2023-11-19 MED ORDER — HEPARIN SOD (PORK) LOCK FLUSH 100 UNIT/ML IV SOLN
500.0000 [IU] | Freq: Once | INTRAVENOUS | Status: AC | PRN
  Administered 2023-11-19: 500 [IU]

## 2023-11-19 MED ORDER — DIPHENHYDRAMINE HCL 25 MG PO CAPS
25.0000 mg | ORAL_CAPSULE | Freq: Once | ORAL | Status: AC
Start: 2023-11-19 — End: 2023-11-19
  Administered 2023-11-19: 25 mg via ORAL
  Filled 2023-11-19: qty 1

## 2023-11-19 MED ORDER — SODIUM CHLORIDE 0.9% FLUSH
10.0000 mL | INTRAVENOUS | Status: DC | PRN
  Administered 2023-11-19: 10 mL

## 2023-11-19 MED ORDER — SODIUM CHLORIDE 0.9% FLUSH
10.0000 mL | Freq: Once | INTRAVENOUS | Status: AC
  Administered 2023-11-19: 10 mL

## 2023-11-19 NOTE — Patient Instructions (Signed)
 [facility] - A DEPT OF [facility]  Discharge Instructions: Thank you for choosing [facility] to provide your oncology and hematology care.   If you have a lab appointment with the Cancer Center, please go directly to the Cancer Center and check in at the registration area.   Wear comfortable clothing and clothing appropriate for easy access to any Portacath or PICC line.   We strive to give you quality time with your provider. You may need to reschedule your appointment if you arrive late (15 or more minutes).  Arriving late affects you and other patients whose appointments are after yours.  Also, if you miss three or more appointments without notifying the office, you may be dismissed from the clinic at the provider's discretion.      For prescription refill requests, have your pharmacy contact our office and allow 72 hours for refills to be completed.    Today you received the following chemotherapy and/or immunotherapy agents: Kanjinti      To help prevent nausea and vomiting after your treatment, we encourage you to take your nausea medication as directed.  BELOW ARE SYMPTOMS THAT SHOULD BE REPORTED IMMEDIATELY: *FEVER GREATER THAN 100.4 F (38 C) OR HIGHER *CHILLS OR SWEATING *NAUSEA AND VOMITING THAT IS NOT CONTROLLED WITH YOUR NAUSEA MEDICATION *UNUSUAL SHORTNESS OF BREATH *UNUSUAL BRUISING OR BLEEDING *URINARY PROBLEMS (pain or burning when urinating, or frequent urination) *BOWEL PROBLEMS (unusual diarrhea, constipation, pain near the anus) TENDERNESS IN MOUTH AND THROAT WITH OR WITHOUT PRESENCE OF ULCERS (sore throat, sores in mouth, or a toothache) UNUSUAL RASH, SWELLING OR PAIN  UNUSUAL VAGINAL DISCHARGE OR ITCHING   Items with * indicate a potential emergency and should be followed up as soon as possible or go to the Emergency Department if any problems should occur.  Please show the CHEMOTHERAPY ALERT CARD or IMMUNOTHERAPY  ALERT CARD at check-in to the Emergency Department and triage nurse.  Should you have questions after your visit or need to cancel or reschedule your appointment, please contact [facility] - A DEPT OF [facility]  Dept: [phone]  and follow the prompts.  Office hours are 8:00 a.m. to 4:30 p.m. Monday - Friday. Please note that voicemails left after 4:00 p.m. may not be returned until the following business day.  We are closed weekends and major holidays. You have access to a nurse at all times for urgent questions. Please call the main number to the clinic Dept: [phone] and follow the prompts.   For any non-urgent questions, you may also contact your provider using MyChart. We now offer e-Visits for anyone 46 and older to request care online for non-urgent symptoms. For details visit [email or web address].   Also download the MyChart app! Go to the app store, search "MyChart", open the app, select [place], and log in with your MyChart username and password.

## 2023-11-22 ENCOUNTER — Other Ambulatory Visit (HOSPITAL_COMMUNITY): Payer: Self-pay | Admitting: Cardiology

## 2023-11-22 ENCOUNTER — Other Ambulatory Visit: Payer: Medicare Other

## 2023-11-22 ENCOUNTER — Inpatient Hospital Stay: Payer: Medicare Other

## 2023-11-22 ENCOUNTER — Inpatient Hospital Stay: Payer: Medicare Other | Admitting: Hematology and Oncology

## 2023-11-26 ENCOUNTER — Encounter (HOSPITAL_COMMUNITY)
Admission: RE | Admit: 2023-11-26 | Discharge: 2023-11-26 | Disposition: A | Discharge status: Home / Self Care | Payer: Medicare Other | Source: Ambulatory Visit | Attending: Hematology and Oncology | Admitting: Hematology and Oncology

## 2023-11-26 DIAGNOSIS — C50112 Malignant neoplasm of central portion of left female breast: Secondary | ICD-10-CM | POA: Diagnosis present

## 2023-11-26 DIAGNOSIS — Z171 Estrogen receptor negative status [ER-]: Secondary | ICD-10-CM

## 2023-11-26 LAB — GLUCOSE, CAPILLARY
Glucose-Capillary: 101 mg/dL — ABNORMAL HIGH (ref 70–99)
Glucose-Capillary: 84 mg/dL (ref 70–99)

## 2023-11-26 MED ORDER — FLUDEOXYGLUCOSE F - 18 (FDG) INJECTION
12.2000 | Freq: Once | INTRAVENOUS | Status: AC
  Administered 2023-11-26: 12.16 via INTRAVENOUS

## 2023-11-30 ENCOUNTER — Other Ambulatory Visit: Payer: Medicare Other | Admitting: Pharmacist

## 2023-11-30 DIAGNOSIS — E119 Type 2 diabetes mellitus without complications: Secondary | ICD-10-CM

## 2023-11-30 NOTE — Telephone Encounter (Signed)
Pt is requesting a callback from [facility] regarding this medication. Pt didn't want to go into detail. Please advise

## 2023-11-30 NOTE — Progress Notes (Signed)
11/30/2023 Name: [name] MRN: [id number] DOB: 1956/06/28  Chief Complaint  Patient presents with   Medication Review    [name] is a 68 y.o. year old female who presented for a telephone visit.   They were referred to the pharmacist by their PCP for assistance in managing complex medication management.    Subjective:  Care Team: Primary Care Provider: [name], MD ; Next Scheduled Visit: not scheduled Oncologist 12/10/23  Medication Access/Adherence  Current Pharmacy:  [id number] - [place], [place] - [address] [place] [address] Phone: [phone] Fax: [phone]  [id number] [place], [place] - [address] AT [address] [place] [address] Phone: [phone] Fax: [phone]   Patient reports affordability concerns with their medications: No  Patient reports access/transportation concerns to their pharmacy: No  Patient reports adherence concerns with their medications:  Yes    Pt has concerns regarding medication side effects and how meds are affecting her body/organs.   Diabetes:  Current medications: None Medications tried in the past: Mounjaro 12.5 - pt stopped taking because she felt it was not working at reducing appetite/helping weight loss. She notes stomach cramping and leg cramping improved after stopping  Hypertension:  Current medications: Telmisartan/hydrochlorothiazide 80-12.5 mg daily Furosemide 20 mg daily - for edema - skips doses when she has to go out due to urinary incontinence when she takes it   Pt complains of a lot of trouble with muscle cramping and also loose stool, wonders if this is due to her medications  Objective:  Lab Results  Component Value Date   HGBA1C 6.2 11/09/2023    Lab Results  Component Value Date   CREATININE 1.08 (H) 11/19/2023   BUN 27 (H) 11/19/2023   NA 140 11/19/2023   K 3.9 11/19/2023   CL 106  11/19/2023   CO2 28 11/19/2023    Lab Results  Component Value Date   CHOL 206 (H) 11/09/2023   HDL 76.90 11/09/2023   LDLCALC 114 (H) 11/09/2023   LDLDIRECT 76.5 02/17/2012   TRIG 79.0 11/09/2023   CHOLHDL 3 11/09/2023    Medications Reviewed Today     Reviewed by [name], RPH (Pharmacist) on 11/30/23 at 1104  Med List Status: <None>   Medication Order Taking? Sig Documenting Provider Last Dose Status Informant  acetaminophen (TYLENOL) 500 MG tablet [id number]  Take 500 mg by mouth every 6 (six) hours as needed for moderate pain (pain score 4-6). [provider]  Active Self  Ascorbic Acid (VITAMIN C PO) [id number] No Take 1 tablet by mouth daily.  Patient not taking: Reported on 10/08/2023   [provider] Not Taking Active Self           Med Note Wyline Mood, PHILICIA R   Tue Apr 27, 2023 11:38 AM)    carvedilol (COREG) 6.25 MG tablet [id number] Yes TAKE 1 TABLET BY MOUTH TWICE A DAY Laurey Morale, MD Taking Active   Cholecalciferol (VITAMIN D3 PO) [id number] Yes Take 1 capsule by mouth daily. [provider] Taking Active Self  Cyanocobalamin (B-12 PO) [id number] Yes Take 1 capsule by mouth daily. [provider] Taking Active Self           Med Note Wyline Mood, PHILICIA R   Tue Apr 27, 2023 11:38 AM)    desloratadine (CLARINEX) 5 MG tablet [id number] No Take 5 mg by mouth as needed.  Patient not taking: Reported on  11/30/2023   [provider] Not Taking Active   fluticasone (FLONASE) 50 MCG/ACT nasal spray [id number]  Place 2 sprays into both nostrils daily. Ashok Croon, MD  Active   furosemide (LASIX) 40 MG tablet [id number] Yes Take 1.5 tablets (60 mg total) by mouth daily. [name], MD Taking Active   gabapentin (NEURONTIN) 300 MG capsule [id number] Yes Take 2 capsules (600 mg total) by mouth at bedtime as needed (pain). [name], MD Taking Active   ketoconazole (NIZORAL) 2 % cream [id number]  Apply 1 Application  topically daily. [name], MD  Active   Magnesium Oxide 400 MG CAPS [id number] No Take 1 capsule (400 mg total) by mouth daily.  Patient not taking: Reported on 11/30/2023   Laurey Morale, MD Not Taking Active   methylcellulose oral powder [id number] No Take 1 packet by mouth daily.  Patient not taking: Reported on 10/08/2023   [provider] Not Taking Active   Multiple Vitamins-Minerals (MULTIVITAMIN WITH MINERALS) tablet [id number] Yes Take 1 tablet by mouth daily. [provider] Taking Active Self  MYRBETRIQ 50 MG TB24 tablet [id number] Yes Take 50 mg by mouth daily. [provider] Taking Active   potassium chloride SA (KLOR-CON M20) 20 MEQ tablet [id number] Yes Take 1 tablet (20 mEq total) by mouth 2 (two) times daily. Laurey Morale, MD Taking Active   telmisartan-hydrochlorothiazide (MICARDIS HCT) 80-12.5 MG tablet [id number] Yes Take 1 tablet by mouth daily. [name], MD Taking Active   tirzepatide [facility]) 12.5 MG/0.5ML Pen [id number] No Inject 12.5 mg into the skin once a week.  Patient not taking: Reported on 11/30/2023   Laurey Morale, MD Not Taking Active   XARELTO 20 MG TABS tablet [id number] Yes TAKE 1 TABLET BY MOUTH DAILY WITH SUPPER. Laurey Morale, MD Taking Active               Assessment/Plan:   Diabetes: - Currently controlled, A1c goal <7% - Reviewed dietary modifications including increased protein/fiber, avoiding large amount of carbs/sugar - Recommend to continue without Mounjaro - noted A1c may be at goal due to [facility], will need to check A1c again in about 3 months to see if A1c increases     Hypertension: - Currently uncontrolled, BP goal <130/80 - Recommend to continue current regimen - Encouraged hydration, BUN:Cr ratio was elevated and may contribute to muscle cramps   Muscle cramps Magnesium in December was low, she has not been taking magnesium consistently. Recommended to restart magnesium. Low Mg  can cause muscle cramping as well     Follow Up Plan: PRN  Arbutus Leas, PharmD, BCPS, CPP Clinical Pharmacist Practitioner [place] [phone]

## 2023-11-30 NOTE — Telephone Encounter (Signed)
Returned patient's call. She reports that she stopped Mounajro 3 weeks ago. Her stomach hurt, didn't feel full. Felt like she gained weight. Doesn't want to take Mounjaro anymore. Stomach doesn't hurt after being off 3 weeks and doesn't want to start all over again. Had loose stools. Advised this is ok. She can stay off awhile and if she decides she wants to resume, she can. She should let her PCP know incase he wants to add anything else for her BG.

## 2023-11-30 NOTE — Addendum Note (Signed)
Addended by: Malena Peer D on: 11/30/2023 04:40 PM   Modules accepted: Orders

## 2023-11-30 NOTE — Patient Instructions (Signed)
It was a pleasure speaking with you today!  Restart magnesium 400 mg daily. Monitor for muscle cramp improvement.  Make sure you are drinking plenty of water to avoid dehydration as well.  Feel free to call with any questions or concerns!  Arbutus Leas, PharmD, BCPS, CPP Clinical Pharmacist Practitioner [place] [phone]

## 2023-12-07 ENCOUNTER — Telehealth: Payer: Self-pay | Admitting: Gastroenterology

## 2023-12-07 DIAGNOSIS — R195 Other fecal abnormalities: Secondary | ICD-10-CM

## 2023-12-07 NOTE — Telephone Encounter (Signed)
Returned call to patient. Pt reports that a couple of weeks ago she noticed small, white pieces in her stool. Pt states that she eats a lot of rice and was not sure if it was just undigested rice. Pt states that it "looks like a worm, curled up". Pt states on 12/04/23 she saw "small piece of white, looked like white larva". Pt states that she always has diarrhea. She did not change anything in her diet or take any new medications or supplements around the time that she has noticed the white spots. Pt states that she typically does not look at her stool, but the two times that she did she noticed the white spots. Pt did take a picture on 12/04/23 of the stool, pt is going to try and upload via MyChart. Please advise, thanks.

## 2023-12-07 NOTE — Telephone Encounter (Signed)
Called and spoke with patient. She will pick up stool kit at her earliest convenience, it may be later this week depending on the weather. Pt verbalized understanding and had no concerns at the end of the call.

## 2023-12-07 NOTE — Telephone Encounter (Signed)
Patient called requesting to speak with a nurse stated she has seen something weird in her stool and she would like to check for parasites.

## 2023-12-07 NOTE — Telephone Encounter (Signed)
Can send stool for ova and parasites to make sure no parasitic infection if she is concerned about that.

## 2023-12-09 NOTE — Progress Notes (Unsigned)
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 swelling and palpitations.  Gastrointestinal:  Negative for abdominal distention, abdominal pain, constipation, diarrhea, nausea and vomiting.  Endocrine: Negative for hot flashes.  Genitourinary:  Negative for difficulty urinating.   Musculoskeletal:  Negative for arthralgias.  Skin:  Negative for itching and rash.  Neurological:  Negative for dizziness, extremity weakness, headaches and numbness.  Hematological:  Negative for adenopathy. Does not bruise/bleed easily.  Psychiatric/Behavioral:  Negative for depression. The patient is not nervous/anxious.       PHYSICAL EXAMINATION     There were no vitals filed for this visit.   Physical Exam Constitutional:      General: She is not in acute distress.    Appearance: Normal appearance. She is not toxic-appearing.  HENT:     Head: Normocephalic and atraumatic.      Mouth/Throat:     Mouth: Mucous membranes are moist.     Pharynx: Oropharynx is clear. No oropharyngeal exudate or posterior oropharyngeal erythema.  Eyes:     General: No scleral icterus. Cardiovascular:     Rate and Rhythm: Normal rate and regular rhythm.     Pulses: Normal pulses.     Heart sounds: Normal heart sounds.  Pulmonary:     Effort: Pulmonary effort is normal.     Breath sounds: Normal breath sounds.  Abdominal:     General: Abdomen is flat. Bowel sounds are normal. There is no distension.     Palpations: Abdomen is soft.     Tenderness: There is no abdominal tenderness.  Musculoskeletal:        General: No swelling.     Cervical back: Neck supple.  Lymphadenopathy:     Cervical: No cervical adenopathy.  Skin:    General: Skin is warm and dry.     Findings: No rash.  Neurological:     General: No focal deficit present.     Mental Status: She is alert.  Psychiatric:  Mood and Affect: Mood normal.        Behavior: Behavior normal.     LABORATORY DATA:  CBC    Component Value Date/Time   WBC 5.8 11/19/2023 0903   WBC 6.4 09/27/2020 1114   RBC 3.33 (L) 11/19/2023 0903   HGB 9.5 (L) 11/19/2023 0903   HGB 11.9 03/30/2017 1044   HCT 30.3 (L) 11/19/2023 0903   HCT 37.4 03/30/2017 1044   PLT 216 11/19/2023 0903   PLT 238 03/30/2017 1044   MCV 91.0 11/19/2023 0903   MCV 85.0 03/30/2017 1044   MCH 28.5 11/19/2023 0903   MCHC 31.4 11/19/2023 0903   RDW 14.6 11/19/2023 0903   RDW 15.1 (H) 03/30/2017 1044   LYMPHSABS 1.4 11/19/2023 0903   LYMPHSABS 1.3 03/30/2017 1044   MONOABS 0.7 11/19/2023 0903   MONOABS 0.5 03/30/2017 1044   EOSABS 0.1 11/19/2023 0903   EOSABS 0.0 03/30/2017 1044   BASOSABS 0.0 11/19/2023 0903   BASOSABS 0.0 03/30/2017 1044    CMP     Component Value Date/Time   NA 140 11/19/2023 0903   NA 143 03/30/2017 1044   K 3.9 11/19/2023 0903   K 3.8 03/30/2017 1044   CL 106 11/19/2023 0903   CL 106 01/18/2013 0909   CO2 28  11/19/2023 0903   CO2 27 03/30/2017 1044   GLUCOSE 122 (H) 11/19/2023 0903   GLUCOSE 91 03/30/2017 1044   GLUCOSE 99 01/18/2013 0909   BUN 27 (H) 11/19/2023 0903   BUN 9.7 03/30/2017 1044   CREATININE 1.08 (H) 11/19/2023 0903   CREATININE 0.8 03/30/2017 1044   CALCIUM 9.4 11/19/2023 0903   CALCIUM 9.7 03/30/2017 1044   PROT 7.5 11/19/2023 0903   PROT 8.0 03/30/2017 1044   ALBUMIN 3.6 11/19/2023 0903   ALBUMIN 3.8 01/27/2021 1232   ALBUMIN 3.4 (L) 03/30/2017 1044   AST 18 11/19/2023 0903   AST 17 03/30/2017 1044   ALT 15 11/19/2023 0903   ALT 15 03/30/2017 1044   ALKPHOS 44 11/19/2023 0903   ALKPHOS 60 03/30/2017 1044   BILITOT 0.4 11/19/2023 0903   BILITOT 0.34 03/30/2017 1044   GFRNONAA 57 (L) 11/19/2023 0903   GFRAA >60 07/11/2020 0923      ASSESSMENT and THERAPY PLAN:   No problem-specific Assessment & Plan notes found for this encounter.     All questions were answered. The patient knows to call the clinic with any problems, questions or concerns. We can certainly see the patient much sooner if necessary.  Total encounter time:30 minutes*in face-to-face visit time, chart review, lab review, care coordination, order entry, and documentation of the encounter time.   *Total Encounter Time as defined by the Centers for Medicare and Medicaid Services includes, in addition to the face-to-face time of a patient visit (documented in the note above) non-face-to-face time: obtaining and reviewing outside history, ordering and reviewing medications, tests or procedures, care coordination (communications with other health care professionals or caregivers) and documentation in the medical record.

## 2023-12-09 NOTE — Assessment & Plan Note (Signed)
This is a very pleasant 68 yr old female with metastatic breast cancer on herceptin maintenance who is here for follow up.  Breast Cancer Stable disease on PET CT with two lymph nodes of uncertain significance. Continues on Herceptin. -Continue Herceptin as scheduled.  Weight Gain Patient reports significant weight gain and difficulty with appetite control. Discontinued Mounjaro due to gastrointestinal side effects and lack of perceived benefit. -Refer to endocrinologist for further management of weight and potential adjustment of Mounjaro or consideration of alternative medications.  Peripheral Neuropathy Ongoing symptoms, likely multifactorial including prior chemotherapy. -Continue current neuropathy medications.  Skin Irritation Reports raw skin under arm and in groin area, possibly fungal. -Prescribe Nystatin powder for application to affected areas.  Lower Extremity Edema Reports worsening leg swelling and discomfort with diuretic use due to urinary incontinence. NO significant change on my exam.  Follow-up Regular monitoring with PET CT and echocardiogram as scheduled.

## 2023-12-10 ENCOUNTER — Inpatient Hospital Stay: Payer: Medicare Other

## 2023-12-10 ENCOUNTER — Inpatient Hospital Stay (HOSPITAL_BASED_OUTPATIENT_CLINIC_OR_DEPARTMENT_OTHER): Payer: Medicare Other | Admitting: Hematology and Oncology

## 2023-12-10 ENCOUNTER — Inpatient Hospital Stay: Payer: Medicare Other | Attending: Hematology and Oncology

## 2023-12-10 VITALS — BP 127/77 | HR 68 | Resp 18

## 2023-12-10 VITALS — BP 147/63 | HR 70 | Temp 98.1°F | Resp 18 | Wt 243.3 lb

## 2023-12-10 DIAGNOSIS — Z95828 Presence of other vascular implants and grafts: Secondary | ICD-10-CM

## 2023-12-10 DIAGNOSIS — Z1722 Progesterone receptor negative status: Secondary | ICD-10-CM | POA: Insufficient documentation

## 2023-12-10 DIAGNOSIS — Z79810 Long term (current) use of selective estrogen receptor modulators (SERMs): Secondary | ICD-10-CM | POA: Diagnosis not present

## 2023-12-10 DIAGNOSIS — C773 Secondary and unspecified malignant neoplasm of axilla and upper limb lymph nodes: Secondary | ICD-10-CM | POA: Diagnosis present

## 2023-12-10 DIAGNOSIS — Z171 Estrogen receptor negative status [ER-]: Secondary | ICD-10-CM

## 2023-12-10 DIAGNOSIS — Z90722 Acquired absence of ovaries, bilateral: Secondary | ICD-10-CM | POA: Insufficient documentation

## 2023-12-10 DIAGNOSIS — C50112 Malignant neoplasm of central portion of left female breast: Secondary | ICD-10-CM | POA: Diagnosis not present

## 2023-12-10 DIAGNOSIS — Z5112 Encounter for antineoplastic immunotherapy: Secondary | ICD-10-CM | POA: Insufficient documentation

## 2023-12-10 DIAGNOSIS — Z170 Estrogen receptor positive status [ER+]: Secondary | ICD-10-CM | POA: Insufficient documentation

## 2023-12-10 DIAGNOSIS — C50812 Malignant neoplasm of overlapping sites of left female breast: Secondary | ICD-10-CM | POA: Insufficient documentation

## 2023-12-10 DIAGNOSIS — Z90710 Acquired absence of both cervix and uterus: Secondary | ICD-10-CM | POA: Insufficient documentation

## 2023-12-10 DIAGNOSIS — Z9221 Personal history of antineoplastic chemotherapy: Secondary | ICD-10-CM | POA: Diagnosis not present

## 2023-12-10 DIAGNOSIS — Z923 Personal history of irradiation: Secondary | ICD-10-CM | POA: Insufficient documentation

## 2023-12-10 DIAGNOSIS — Z8542 Personal history of malignant neoplasm of other parts of uterus: Secondary | ICD-10-CM | POA: Insufficient documentation

## 2023-12-10 LAB — CBC WITH DIFFERENTIAL (CANCER CENTER ONLY)
Abs Immature Granulocytes: 0.01 10*3/uL (ref 0.00–0.07)
Basophils Absolute: 0 10*3/uL (ref 0.0–0.1)
Basophils Relative: 0 %
Eosinophils Absolute: 0.2 10*3/uL (ref 0.0–0.5)
Eosinophils Relative: 3 %
HCT: 31.7 % — ABNORMAL LOW (ref 36.0–46.0)
Hemoglobin: 9.9 g/dL — ABNORMAL LOW (ref 12.0–15.0)
Immature Granulocytes: 0 %
Lymphocytes Relative: 26 %
Lymphs Abs: 1.5 10*3/uL (ref 0.7–4.0)
MCH: 28.4 pg (ref 26.0–34.0)
MCHC: 31.2 g/dL (ref 30.0–36.0)
MCV: 90.8 fL (ref 80.0–100.0)
Monocytes Absolute: 0.7 10*3/uL (ref 0.1–1.0)
Monocytes Relative: 12 %
Neutro Abs: 3.2 10*3/uL (ref 1.7–7.7)
Neutrophils Relative %: 59 %
Platelet Count: 197 10*3/uL (ref 150–400)
RBC: 3.49 MIL/uL — ABNORMAL LOW (ref 3.87–5.11)
RDW: 14.6 % (ref 11.5–15.5)
WBC Count: 5.5 10*3/uL (ref 4.0–10.5)
nRBC: 0 % (ref 0.0–0.2)

## 2023-12-10 LAB — CMP (CANCER CENTER ONLY)
ALT: 15 U/L (ref 0–44)
AST: 18 U/L (ref 15–41)
Albumin: 3.8 g/dL (ref 3.5–5.0)
Alkaline Phosphatase: 47 U/L (ref 38–126)
Anion gap: 5 (ref 5–15)
BUN: 29 mg/dL — ABNORMAL HIGH (ref 8–23)
CO2: 28 mmol/L (ref 22–32)
Calcium: 9.8 mg/dL (ref 8.9–10.3)
Chloride: 103 mmol/L (ref 98–111)
Creatinine: 1.1 mg/dL — ABNORMAL HIGH (ref 0.44–1.00)
GFR, Estimated: 56 mL/min — ABNORMAL LOW (ref >60)
Glucose, Bld: 131 mg/dL — ABNORMAL HIGH (ref 70–99)
Potassium: 3.9 mmol/L (ref 3.5–5.1)
Sodium: 136 mmol/L (ref 135–145)
Total Bilirubin: 0.4 mg/dL (ref 0.0–1.2)
Total Protein: 7.6 g/dL (ref 6.5–8.1)

## 2023-12-10 MED ORDER — SODIUM CHLORIDE 0.9 % IV SOLN
Freq: Once | INTRAVENOUS | Status: AC
Start: 2023-12-10 — End: 2023-12-10

## 2023-12-10 MED ORDER — SODIUM CHLORIDE 0.9% FLUSH
10.0000 mL | INTRAVENOUS | Status: DC | PRN
  Administered 2023-12-10: 10 mL

## 2023-12-10 MED ORDER — NYSTATIN 100000 UNIT/GM EX POWD: 1.0000 | Freq: Three times a day (TID) | CUTANEOUS | 0 refills | Status: AC

## 2023-12-10 MED ORDER — HEPARIN SOD (PORK) LOCK FLUSH 100 UNIT/ML IV SOLN
500.0000 [IU] | Freq: Once | INTRAVENOUS | Status: AC | PRN
  Administered 2023-12-10: 500 [IU]

## 2023-12-10 MED ORDER — TRASTUZUMAB-ANNS CHEMO 150 MG IV SOLR
6.0000 mg/kg | Freq: Once | INTRAVENOUS | Status: AC
  Administered 2023-12-10: 600 mg via INTRAVENOUS
  Filled 2023-12-10: qty 28.57

## 2023-12-10 MED ORDER — SODIUM CHLORIDE 0.9% FLUSH
10.0000 mL | Freq: Once | INTRAVENOUS | Status: AC
  Administered 2023-12-10: 10 mL

## 2023-12-10 MED ORDER — ACETAMINOPHEN 325 MG PO TABS
650.0000 mg | ORAL_TABLET | Freq: Once | ORAL | Status: AC
Start: 2023-12-10 — End: 2023-12-10
  Administered 2023-12-10: 650 mg via ORAL
  Filled 2023-12-10: qty 2

## 2023-12-10 MED ORDER — DIPHENHYDRAMINE HCL 25 MG PO CAPS
25.0000 mg | ORAL_CAPSULE | Freq: Once | ORAL | Status: AC
  Administered 2023-12-10: 25 mg via ORAL
  Filled 2023-12-10: qty 1

## 2023-12-10 NOTE — Patient Instructions (Signed)
 [facility] A [facility]  Discharge Instructions: Thank you for choosing [facility] to provide your oncology and hematology care.   If you have a lab appointment with the Cancer Center, please go directly to the Cancer Center and check in at the registration area.   Wear comfortable clothing and clothing appropriate for easy access to any Portacath or PICC line.   We strive to give you quality time with your provider. You may need to reschedule your appointment if you arrive late (15 or more minutes).  Arriving late affects you and other patients whose appointments are after yours.  Also, if you miss three or more appointments without notifying the office, you may be dismissed from the clinic at the provider's discretion.      For prescription refill requests, have your pharmacy contact our office and allow 72 hours for refills to be completed.    Today you received the following chemotherapy and/or immunotherapy agents: Kanjinti      To help prevent nausea and vomiting after your treatment, we encourage you to take your nausea medication as directed.  BELOW ARE SYMPTOMS THAT SHOULD BE REPORTED IMMEDIATELY: *FEVER GREATER THAN 100.4 F (38 C) OR HIGHER *CHILLS OR SWEATING *NAUSEA AND VOMITING THAT IS NOT CONTROLLED WITH YOUR NAUSEA MEDICATION *UNUSUAL SHORTNESS OF BREATH *UNUSUAL BRUISING OR BLEEDING *URINARY PROBLEMS (pain or burning when urinating, or frequent urination) *BOWEL PROBLEMS (unusual diarrhea, constipation, pain near the anus) TENDERNESS IN MOUTH AND THROAT WITH OR WITHOUT PRESENCE OF ULCERS (sore throat, sores in mouth, or a toothache) UNUSUAL RASH, SWELLING OR PAIN  UNUSUAL VAGINAL DISCHARGE OR ITCHING   Items with * indicate a potential emergency and should be followed up as soon as possible or go to the Emergency Department if any problems should occur.  Please show the CHEMOTHERAPY ALERT CARD or IMMUNOTHERAPY  ALERT CARD at check-in to the Emergency Department and triage nurse.  Should you have questions after your visit or need to cancel or reschedule your appointment, please contact [facility]  Dept: [phone]  and follow the prompts.  Office hours are 8:00 a.m. to 4:30 p.m. Monday - Friday. Please note that voicemails left after 4:00 p.m. may not be returned until the following business day.  We are closed weekends and major holidays. You have access to a nurse at all times for urgent questions. Please call the main number to the clinic Dept: [phone] and follow the prompts.   For any non-urgent questions, you may also contact your provider using MyChart. We now offer e-Visits for anyone 60 and older to request care online for non-urgent symptoms. For details visit [email or web address].   Also download the MyChart app! Go to the app store, search "MyChart", open the app, select [place], and log in with your MyChart username and password.

## 2023-12-10 NOTE — Progress Notes (Signed)
Per Md hold retacrit for HGB 9.9

## 2023-12-13 ENCOUNTER — Other Ambulatory Visit: Payer: Medicare Other

## 2023-12-13 ENCOUNTER — Ambulatory Visit: Payer: Medicare Other | Admitting: Adult Health

## 2023-12-13 ENCOUNTER — Ambulatory Visit: Payer: Medicare Other

## 2023-12-17 ENCOUNTER — Encounter: Payer: Self-pay | Admitting: Hematology and Oncology

## 2023-12-30 ENCOUNTER — Ambulatory Visit (HOSPITAL_BASED_OUTPATIENT_CLINIC_OR_DEPARTMENT_OTHER): Payer: Medicare Other | Admitting: Internal Medicine

## 2023-12-31 ENCOUNTER — Other Ambulatory Visit: Payer: Self-pay | Admitting: Pharmacist

## 2023-12-31 ENCOUNTER — Inpatient Hospital Stay: Payer: Medicare Other | Attending: Hematology and Oncology

## 2023-12-31 ENCOUNTER — Inpatient Hospital Stay

## 2023-12-31 ENCOUNTER — Inpatient Hospital Stay: Payer: Medicare Other

## 2023-12-31 VITALS — BP 135/79 | HR 58 | Temp 97.8°F | Resp 17 | Wt 244.5 lb

## 2023-12-31 DIAGNOSIS — Z170 Estrogen receptor positive status [ER+]: Secondary | ICD-10-CM | POA: Diagnosis not present

## 2023-12-31 DIAGNOSIS — C773 Secondary and unspecified malignant neoplasm of axilla and upper limb lymph nodes: Secondary | ICD-10-CM | POA: Insufficient documentation

## 2023-12-31 DIAGNOSIS — Z1722 Progesterone receptor negative status: Secondary | ICD-10-CM | POA: Diagnosis not present

## 2023-12-31 DIAGNOSIS — Z5112 Encounter for antineoplastic immunotherapy: Secondary | ICD-10-CM | POA: Diagnosis present

## 2023-12-31 DIAGNOSIS — Z95828 Presence of other vascular implants and grafts: Secondary | ICD-10-CM

## 2023-12-31 DIAGNOSIS — Z1731 Human epidermal growth factor receptor 2 positive status: Secondary | ICD-10-CM | POA: Insufficient documentation

## 2023-12-31 DIAGNOSIS — C50112 Malignant neoplasm of central portion of left female breast: Secondary | ICD-10-CM

## 2023-12-31 DIAGNOSIS — Z8542 Personal history of malignant neoplasm of other parts of uterus: Secondary | ICD-10-CM | POA: Insufficient documentation

## 2023-12-31 DIAGNOSIS — C50812 Malignant neoplasm of overlapping sites of left female breast: Secondary | ICD-10-CM | POA: Diagnosis present

## 2023-12-31 LAB — CMP (CANCER CENTER ONLY)
ALT: 12 U/L (ref 0–44)
AST: 17 U/L (ref 15–41)
Albumin: 3.9 g/dL (ref 3.5–5.0)
Alkaline Phosphatase: 48 U/L (ref 38–126)
Anion gap: 7 (ref 5–15)
BUN: 29 mg/dL — ABNORMAL HIGH (ref 8–23)
CO2: 28 mmol/L (ref 22–32)
Calcium: 9.7 mg/dL (ref 8.9–10.3)
Chloride: 103 mmol/L (ref 98–111)
Creatinine: 1.05 mg/dL — ABNORMAL HIGH (ref 0.44–1.00)
GFR, Estimated: 59 mL/min — ABNORMAL LOW (ref >60)
Glucose, Bld: 129 mg/dL — ABNORMAL HIGH (ref 70–99)
Potassium: 3.8 mmol/L (ref 3.5–5.1)
Sodium: 138 mmol/L (ref 135–145)
Total Bilirubin: 0.4 mg/dL (ref 0.0–1.2)
Total Protein: 8.1 g/dL (ref 6.5–8.1)

## 2023-12-31 LAB — CBC WITH DIFFERENTIAL (CANCER CENTER ONLY)
Abs Immature Granulocytes: 0.02 10*3/uL (ref 0.00–0.07)
Basophils Absolute: 0 10*3/uL (ref 0.0–0.1)
Basophils Relative: 0 %
Eosinophils Absolute: 0.1 10*3/uL (ref 0.0–0.5)
Eosinophils Relative: 2 %
HCT: 33.8 % — ABNORMAL LOW (ref 36.0–46.0)
Hemoglobin: 10.7 g/dL — ABNORMAL LOW (ref 12.0–15.0)
Immature Granulocytes: 0 %
Lymphocytes Relative: 27 %
Lymphs Abs: 1.4 10*3/uL (ref 0.7–4.0)
MCH: 29 pg (ref 26.0–34.0)
MCHC: 31.7 g/dL (ref 30.0–36.0)
MCV: 91.6 fL (ref 80.0–100.0)
Monocytes Absolute: 0.5 10*3/uL (ref 0.1–1.0)
Monocytes Relative: 11 %
Neutro Abs: 2.9 10*3/uL (ref 1.7–7.7)
Neutrophils Relative %: 60 %
Platelet Count: 190 10*3/uL (ref 150–400)
RBC: 3.69 MIL/uL — ABNORMAL LOW (ref 3.87–5.11)
RDW: 14.1 % (ref 11.5–15.5)
WBC Count: 5 10*3/uL (ref 4.0–10.5)
nRBC: 0 % (ref 0.0–0.2)

## 2023-12-31 MED ORDER — HEPARIN SOD (PORK) LOCK FLUSH 100 UNIT/ML IV SOLN
500.0000 [IU] | Freq: Once | INTRAVENOUS | Status: AC | PRN
  Administered 2023-12-31: 500 [IU]

## 2023-12-31 MED ORDER — SODIUM CHLORIDE 0.9% FLUSH
10.0000 mL | Freq: Once | INTRAVENOUS | Status: AC
  Administered 2023-12-31: 10 mL

## 2023-12-31 MED ORDER — DIPHENHYDRAMINE HCL 25 MG PO CAPS
25.0000 mg | ORAL_CAPSULE | Freq: Once | ORAL | Status: AC
Start: 2023-12-31 — End: 2023-12-31
  Administered 2023-12-31: 25 mg via ORAL

## 2023-12-31 MED ORDER — TRASTUZUMAB-ANNS CHEMO 150 MG IV SOLR
6.0000 mg/kg | Freq: Once | INTRAVENOUS | Status: AC
  Administered 2023-12-31: 600 mg via INTRAVENOUS
  Filled 2023-12-31: qty 28.57

## 2023-12-31 MED ORDER — SODIUM CHLORIDE 0.9 % IV SOLN
Freq: Once | INTRAVENOUS | Status: AC
Start: 2023-12-31 — End: 2023-12-31

## 2023-12-31 MED ORDER — ACETAMINOPHEN 325 MG PO TABS
650.0000 mg | ORAL_TABLET | Freq: Once | ORAL | Status: AC
  Administered 2023-12-31: 650 mg via ORAL

## 2023-12-31 MED ORDER — ALTEPLASE 2 MG IJ SOLR
2.0000 mg | Freq: Once | INTRAMUSCULAR | Status: AC
  Administered 2023-12-31: 2 mg
  Filled 2023-12-31: qty 2

## 2023-12-31 MED ORDER — SODIUM CHLORIDE 0.9% FLUSH
10.0000 mL | INTRAVENOUS | Status: DC | PRN
  Administered 2023-12-31: 10 mL

## 2023-12-31 NOTE — Patient Instructions (Signed)
 [facility]  Discharge Instructions: Thank you for choosing [facility] to provide your oncology and hematology care.   If you have a lab appointment with the Cancer Center, please go directly to the Cancer Center and check in at the registration area.   Wear comfortable clothing and clothing appropriate for easy access to any Portacath or PICC line.   We strive to give you quality time with your provider. You may need to reschedule your appointment if you arrive late (15 or more minutes).  Arriving late affects you and other patients whose appointments are after yours.  Also, if you miss three or more appointments without notifying the office, you may be dismissed from the clinic at the provider's discretion.      For prescription refill requests, have your pharmacy contact our office and allow 72 hours for refills to be completed.    Today you received the following chemotherapy and/or immunotherapy agents: Trastuzumab      To help prevent nausea and vomiting after your treatment, we encourage you to take your nausea medication as directed.  BELOW ARE SYMPTOMS THAT SHOULD BE REPORTED IMMEDIATELY: *FEVER GREATER THAN 100.4 F (38 C) OR HIGHER *CHILLS OR SWEATING *NAUSEA AND VOMITING THAT IS NOT CONTROLLED WITH YOUR NAUSEA MEDICATION *UNUSUAL SHORTNESS OF BREATH *UNUSUAL BRUISING OR BLEEDING *URINARY PROBLEMS (pain or burning when urinating, or frequent urination) *BOWEL PROBLEMS (unusual diarrhea, constipation, pain near the anus) TENDERNESS IN MOUTH AND THROAT WITH OR WITHOUT PRESENCE OF ULCERS (sore throat, sores in mouth, or a toothache) UNUSUAL RASH, SWELLING OR PAIN  UNUSUAL VAGINAL DISCHARGE OR ITCHING   Items with * indicate a potential emergency and should be followed up as soon as possible or go to the Emergency Department if any problems should occur.  Please show the CHEMOTHERAPY ALERT CARD or  IMMUNOTHERAPY ALERT CARD at check-in to the Emergency Department and triage nurse.  Should you have questions after your visit or need to cancel or reschedule your appointment, please contact [facility]  Dept: [phone]  and follow the prompts.  Office hours are 8:00 a.m. to 4:30 p.m. Monday - Friday. Please note that voicemails left after 4:00 p.m. may not be returned until the following business day.  We are closed weekends and major holidays. You have access to a nurse at all times for urgent questions. Please call the main number to the clinic Dept: [phone] and follow the prompts.   For any non-urgent questions, you may also contact your provider using MyChart. We now offer e-Visits for anyone 62 and older to request care online for non-urgent symptoms. For details visit [email or web address].   Also download the MyChart app! Go to the app store, search "MyChart", open the app, select [place], and log in with your MyChart username and password.

## 2024-01-03 ENCOUNTER — Ambulatory Visit: Payer: Medicare Other | Admitting: Hematology and Oncology

## 2024-01-03 ENCOUNTER — Ambulatory Visit: Payer: Medicare Other

## 2024-01-03 ENCOUNTER — Other Ambulatory Visit: Payer: Medicare Other
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
## 2024-01-12 ENCOUNTER — Encounter: Payer: Self-pay | Admitting: Gastroenterology

## 2024-01-12 ENCOUNTER — Ambulatory Visit (INDEPENDENT_AMBULATORY_CARE_PROVIDER_SITE_OTHER): Payer: Medicare Other | Admitting: Gastroenterology

## 2024-01-12 ENCOUNTER — Other Ambulatory Visit: Payer: Self-pay

## 2024-01-12 ENCOUNTER — Ambulatory Visit

## 2024-01-12 VITALS — BP 146/78 | HR 64 | Ht 59.0 in

## 2024-01-12 DIAGNOSIS — R195 Other fecal abnormalities: Secondary | ICD-10-CM | POA: Diagnosis not present

## 2024-01-12 DIAGNOSIS — K529 Noninfective gastroenteritis and colitis, unspecified: Secondary | ICD-10-CM

## 2024-01-12 NOTE — Progress Notes (Signed)
 Agree with assessment and plan as outlined.

## 2024-01-12 NOTE — Patient Instructions (Signed)
 Your provider has requested that you go to the basement level for lab work before leaving today. Press "B" on the elevator. The lab is located at the first door on the left as you exit the elevator.  Due to recent changes in healthcare laws, you may see the results of your imaging and laboratory studies on MyChart before your provider has had a chance to review them.  We understand that in some cases there may be results that are confusing or concerning to you. Not all laboratory results come back in the same time frame and the provider may be waiting for multiple results in order to interpret others.  Please give [place] 48 hours in order for your provider to thoroughly review all the results before contacting the office for clarification of your results.   _______________________________________________________  If your blood pressure at your visit was 140/90 or greater, please contact your primary care physician to follow up on this.  _______________________________________________________  If you are age 25 or older, your body mass index should be between 23-30. Your Body mass index is 49.38 kg/m. If this is out of the aforementioned range listed, please consider follow up with your Primary Care Provider.  If you are age 85 or younger, your body mass index should be between 19-25. Your Body mass index is 49.38 kg/m. If this is out of the aformentioned range listed, please consider follow up with your Primary Care Provider.   ________________________________________________________  The [place] [facility] providers would like to encourage you to use [facility] to communicate with providers for non-urgent requests or questions.  Due to long hold times on the telephone, sending your provider a message by [facility] may be a faster and more efficient way to get a response.  Please allow 48 business hours for a response.  Please remember that this is for non-urgent requests.   _______________________________________________________

## 2024-01-14 ENCOUNTER — Encounter: Payer: Self-pay | Admitting: Gastroenterology

## 2024-01-14 LAB — OVA AND PARASITE EXAMINATION
CONCENTRATE RESULT:: NONE SEEN
MICRO NUMBER:: 16250355
SPECIMEN QUALITY:: ADEQUATE
TRICHROME RESULT:: NONE SEEN

## 2024-01-19 NOTE — Assessment & Plan Note (Signed)
 This is a very pleasant 68 yr old female with metastatic breast cancer on herceptin maintenance who is here for follow up. Assessment & Plan Metastatic Breast Cancer Undergoing Herceptin therapy every 21 days. Recent PET scan showed increased lymph node activity, not worrisome. No treatment changes needed. - Continue Herceptin every 21 days. - Monitor lymph nodes for activity changes with repeat PET imaging in 3/4 months.  Anemia Hemoglobin at 10.2, stable without recent Retacrit. Above Retacrit threshold. - Monitor CBC. - Administer Retacrit if hemoglobin drops below 10.  Steatorrhea Greasy, mushy stools suggestive of fat malabsorption. Stool test negative for ova or parasites. Pancreatic elastase test ordered. - Submit stool sample for pancreatic elastase test. 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  At this time, we couldn't come up with a proper solution for this. 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 Reports frequent urination and incomplete bladder emptying. Plans to discuss alternatives with urologist. - Consult urologist regarding Myrbetriq dosage or alternatives.  Skin Irritation Irritation and drainage under one arm likely from sweat accumulation. Skin is raw with odor and debris. Nonmetallic deodorant recommended. - Use nonmetallic deodorant to reduce sweat and promote healing.  Follow-up Scheduled ECHO in May and oncology follow-up in April.  - Schedule ECHO on May 5th. - Attend oncology follow-up on April 25th. -- Attend infusion and port flush on May 16th.

## 2024-01-19 NOTE — Progress Notes (Unsigned)
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 (06/15/2023)   Humiliation, Afraid, Rape, and Kick questionnaire    Fear of Current or Ex-Partner: No    Emotionally Abused: No    Physically Abused: No    Sexually Abused: No    FAMILY HISTORY: Family History  Problem Relation Age of Onset   Diabetes Mother    Hypertension Mother    Ovarian cancer Mother    Heart disease Father        COPD   Alcohol abuse Father        ETOH dependence   Breast cancer Maternal Aunt        dx in her 10s   Lung cancer Maternal Uncle    Breast cancer Paternal Aunt    Cancer Maternal Grandmother        salivary gland cancer   Colon cancer Neg Hx    Colon polyps Neg Hx    Esophageal cancer Neg Hx    Stomach cancer Neg Hx    Rectal cancer Neg Hx     Review of Systems  Constitutional:  Negative for appetite change, chills, fatigue, fever and unexpected weight change.  HENT:   Negative for hearing loss, lump/mass and trouble swallowing.   Eyes:  Negative for eye  problems and icterus.  Respiratory:  Negative for chest tightness, cough and shortness of breath.   Cardiovascular:  Negative for chest pain, leg swelling and palpitations.  Gastrointestinal:  Negative for abdominal distention, abdominal pain, constipation, diarrhea, nausea and vomiting.  Endocrine: Negative for hot flashes.  Genitourinary:  Negative for difficulty urinating.   Musculoskeletal:  Negative for arthralgias.  Skin:  Negative for itching and rash.  Neurological:  Negative for dizziness, extremity weakness, headaches and numbness.  Hematological:  Negative for adenopathy. Does not bruise/bleed easily.  Psychiatric/Behavioral:  Negative for depression. The patient is not nervous/anxious.       PHYSICAL EXAMINATION     Vitals:   01/20/24 0933  BP: 134/72  Pulse: 69  Resp: 17  Temp: 98.3 F (36.8 C)  SpO2: 99%      Physical Exam Constitutional:      General: She is not in acute distress.    Appearance: Normal appearance. She is not toxic-appearing.  HENT:     Head: Normocephalic and atraumatic.     Mouth/Throat:     Mouth: Mucous membranes are  moist.     Pharynx: Oropharynx is clear. No oropharyngeal exudate or posterior oropharyngeal erythema.  Eyes:     General: No scleral icterus. Cardiovascular:     Rate and Rhythm: Normal rate and regular rhythm.     Pulses: Normal pulses.     Heart sounds: Normal heart sounds.  Pulmonary:     Effort: Pulmonary effort is normal.     Breath sounds: Normal breath sounds.  Abdominal:     General: Abdomen is flat. Bowel sounds are normal. There is no distension.     Palpations: Abdomen is soft.     Tenderness: There is no abdominal tenderness.  Musculoskeletal:        General: Swelling (BLE noted) present.     Cervical back: Neck supple.  Lymphadenopathy:     Cervical: No cervical adenopathy.  Skin:    General: Skin is warm and dry.     Findings: No rash.  Neurological:     General: No focal deficit present.      Mental Status: She is alert.     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        Behavior: Behavior normal.     LABORATORY DATA:  CBC    Component Value Date/Time   WBC 5.4 01/20/2024 0909   WBC 6.4 09/27/2020 1114   RBC 3.59 (L) 01/20/2024 0909   HGB 10.2 (L) 01/20/2024 0909   HGB 11.9 03/30/2017 1044   HCT 32.1 (L) 01/20/2024 0909   HCT 37.4 03/30/2017 1044   PLT 200 01/20/2024 0909   PLT 238 03/30/2017 1044   MCV 89.4 01/20/2024 0909   MCV 85.0 03/30/2017 1044   MCH 28.4 01/20/2024 0909   MCHC 31.8 01/20/2024 0909   RDW 13.7 01/20/2024 0909   RDW 15.1 (H) 03/30/2017 1044   LYMPHSABS 1.4 01/20/2024 0909   LYMPHSABS 1.3 03/30/2017 1044   MONOABS 0.7 01/20/2024 0909   MONOABS 0.5 03/30/2017 1044   EOSABS 0.1 01/20/2024 0909   EOSABS 0.0 03/30/2017 1044   BASOSABS 0.0 01/20/2024 0909   BASOSABS 0.0 03/30/2017 1044    CMP     Component Value Date/Time   NA 138 12/31/2023 0955   NA 143 03/30/2017 1044   K 3.8 12/31/2023 0955   K 3.8 03/30/2017 1044   CL 103 12/31/2023 0955   CL 106 01/18/2013 0909   CO2 28 12/31/2023 0955   CO2 27 03/30/2017 1044   GLUCOSE 129 (H) 12/31/2023 0955   GLUCOSE 91 03/30/2017 1044   GLUCOSE 99 01/18/2013 0909   BUN 29 (H) 12/31/2023 0955   BUN 9.7 03/30/2017 1044   CREATININE 1.05 (H) 12/31/2023 0955   CREATININE 0.8 03/30/2017 1044   CALCIUM 9.7 12/31/2023 0955   CALCIUM 9.7 03/30/2017 1044   PROT 8.1 12/31/2023 0955   PROT 8.0 03/30/2017 1044   ALBUMIN 3.9 12/31/2023 0955   ALBUMIN 3.8 01/27/2021 1232   ALBUMIN 3.4 (L) 03/30/2017 1044   AST 17 12/31/2023 0955   AST 17 03/30/2017 1044   ALT 12 12/31/2023 0955   ALT 15 03/30/2017 1044   ALKPHOS 48 12/31/2023 0955   ALKPHOS 60 03/30/2017 1044   BILITOT 0.4 12/31/2023 0955   BILITOT 0.34 03/30/2017 1044   GFRNONAA 59 (L) 12/31/2023 0955   GFRAA >60 07/11/2020 0923     ASSESSMENT and THERAPY PLAN:   Malignant neoplasm of central  portion of left breast in female, estrogen receptor negative (HCC) This is a very pleasant 68 yr old female with metastatic breast cancer on herceptin maintenance who is here for follow up. Assessment & Plan Metastatic Breast Cancer Undergoing Herceptin therapy every 21 days. Recent PET scan showed increased lymph node activity, not worrisome. No treatment changes needed. - Continue Herceptin every 21 days. - Monitor lymph nodes for activity changes with repeat PET imaging in 3/4 months.  Anemia Hemoglobin at 10.2, stable without recent Retacrit. Above Retacrit threshold. - Monitor CBC. - Administer Retacrit if hemoglobin drops below 10.  Steatorrhea Greasy, mushy stools suggestive of fat malabsorption. Stool test negative for ova or parasites. Pancreatic elastase test ordered. - Submit stool sample for pancreatic elastase test. 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  At this time, we couldn't come up with a proper solution for this. 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 Reports frequent urination and incomplete bladder emptying. Plans to discuss alternatives with urologist. - Consult urologist regarding Myrbetriq dosage or alternatives.  Skin Irritation Irritation and drainage under one arm likely from sweat accumulation. Skin is raw with odor and debris. Nonmetallic deodorant recommended. - Use nonmetallic deodorant to reduce sweat and promote healing.  Follow-up Scheduled ECHO in May and oncology follow-up in April.  - Schedule ECHO on May 5th. - Attend oncology follow-up on April 25th. -- Attend infusion and port flush on May 16th.       All questions were answered. The patient knows to call the clinic  with any problems, questions or concerns. We can certainly see the patient much sooner if necessary.  Total encounter time:30 minutes*in face-to-face visit time, chart review, lab review, care coordination, order entry, and documentation of the encounter time.   *Total Encounter Time as defined by the Centers for Medicare and Medicaid Services includes, in addition to the face-to-face time of a patient visit (documented in the note above) non-face-to-face time: obtaining and reviewing outside history, ordering and reviewing medications, tests or procedures, care coordination (communications with other health care professionals or caregivers) and documentation in the medical record.

## 2024-01-20 ENCOUNTER — Ambulatory Visit

## 2024-01-20 ENCOUNTER — Inpatient Hospital Stay (HOSPITAL_BASED_OUTPATIENT_CLINIC_OR_DEPARTMENT_OTHER): Admitting: Hematology and Oncology

## 2024-01-20 ENCOUNTER — Inpatient Hospital Stay: Attending: Hematology and Oncology

## 2024-01-20 ENCOUNTER — Inpatient Hospital Stay

## 2024-01-20 DIAGNOSIS — C773 Secondary and unspecified malignant neoplasm of axilla and upper limb lymph nodes: Secondary | ICD-10-CM | POA: Insufficient documentation

## 2024-01-20 DIAGNOSIS — Z5112 Encounter for antineoplastic immunotherapy: Secondary | ICD-10-CM | POA: Insufficient documentation

## 2024-01-20 DIAGNOSIS — Z170 Estrogen receptor positive status [ER+]: Secondary | ICD-10-CM | POA: Insufficient documentation

## 2024-01-20 DIAGNOSIS — Z9221 Personal history of antineoplastic chemotherapy: Secondary | ICD-10-CM | POA: Insufficient documentation

## 2024-01-20 DIAGNOSIS — Z86718 Personal history of other venous thrombosis and embolism: Secondary | ICD-10-CM | POA: Insufficient documentation

## 2024-01-20 DIAGNOSIS — C50812 Malignant neoplasm of overlapping sites of left female breast: Secondary | ICD-10-CM

## 2024-01-20 DIAGNOSIS — Z923 Personal history of irradiation: Secondary | ICD-10-CM | POA: Insufficient documentation

## 2024-01-20 DIAGNOSIS — Z8542 Personal history of malignant neoplasm of other parts of uterus: Secondary | ICD-10-CM | POA: Diagnosis not present

## 2024-01-20 DIAGNOSIS — C50112 Malignant neoplasm of central portion of left female breast: Secondary | ICD-10-CM

## 2024-01-20 DIAGNOSIS — Z171 Estrogen receptor negative status [ER-]: Secondary | ICD-10-CM | POA: Insufficient documentation

## 2024-01-20 DIAGNOSIS — R195 Other fecal abnormalities: Secondary | ICD-10-CM

## 2024-01-20 DIAGNOSIS — Z95828 Presence of other vascular implants and grafts: Secondary | ICD-10-CM

## 2024-01-20 DIAGNOSIS — Z1731 Human epidermal growth factor receptor 2 positive status: Secondary | ICD-10-CM | POA: Insufficient documentation

## 2024-01-20 LAB — CBC WITH DIFFERENTIAL (CANCER CENTER ONLY)
Abs Immature Granulocytes: 0.01 10*3/uL (ref 0.00–0.07)
Basophils Absolute: 0 10*3/uL (ref 0.0–0.1)
Basophils Relative: 0 %
Eosinophils Absolute: 0.1 10*3/uL (ref 0.0–0.5)
Eosinophils Relative: 2 %
HCT: 32.1 % — ABNORMAL LOW (ref 36.0–46.0)
Hemoglobin: 10.2 g/dL — ABNORMAL LOW (ref 12.0–15.0)
Immature Granulocytes: 0 %
Lymphocytes Relative: 26 %
Lymphs Abs: 1.4 10*3/uL (ref 0.7–4.0)
MCH: 28.4 pg (ref 26.0–34.0)
MCHC: 31.8 g/dL (ref 30.0–36.0)
MCV: 89.4 fL (ref 80.0–100.0)
Monocytes Absolute: 0.7 10*3/uL (ref 0.1–1.0)
Monocytes Relative: 12 %
Neutro Abs: 3.2 10*3/uL (ref 1.7–7.7)
Neutrophils Relative %: 60 %
Platelet Count: 200 10*3/uL (ref 150–400)
RBC: 3.59 MIL/uL — ABNORMAL LOW (ref 3.87–5.11)
RDW: 13.7 % (ref 11.5–15.5)
WBC Count: 5.4 10*3/uL (ref 4.0–10.5)
nRBC: 0 % (ref 0.0–0.2)

## 2024-01-20 LAB — CMP (CANCER CENTER ONLY)
ALT: 11 U/L (ref 0–44)
AST: 15 U/L (ref 15–41)
Albumin: 3.8 g/dL (ref 3.5–5.0)
Alkaline Phosphatase: 46 U/L (ref 38–126)
Anion gap: 6 (ref 5–15)
BUN: 27 mg/dL — ABNORMAL HIGH (ref 8–23)
CO2: 30 mmol/L (ref 22–32)
Calcium: 9.4 mg/dL (ref 8.9–10.3)
Chloride: 104 mmol/L (ref 98–111)
Creatinine: 1.06 mg/dL — ABNORMAL HIGH (ref 0.44–1.00)
GFR, Estimated: 58 mL/min — ABNORMAL LOW (ref >60)
Glucose, Bld: 151 mg/dL — ABNORMAL HIGH (ref 70–99)
Potassium: 3.8 mmol/L (ref 3.5–5.1)
Sodium: 140 mmol/L (ref 135–145)
Total Bilirubin: 0.4 mg/dL (ref 0.0–1.2)
Total Protein: 8 g/dL (ref 6.5–8.1)

## 2024-01-20 MED ORDER — DIPHENHYDRAMINE HCL 25 MG PO CAPS
25.0000 mg | ORAL_CAPSULE | Freq: Once | ORAL | Status: AC
  Administered 2024-01-20: 25 mg via ORAL
  Filled 2024-01-20: qty 1

## 2024-01-20 MED ORDER — ACETAMINOPHEN 325 MG PO TABS
650.0000 mg | ORAL_TABLET | Freq: Once | ORAL | Status: AC
  Administered 2024-01-20: 650 mg via ORAL
  Filled 2024-01-20: qty 2

## 2024-01-20 MED ORDER — SODIUM CHLORIDE 0.9 % IV SOLN: Freq: Once | INTRAVENOUS | Status: AC

## 2024-01-20 MED ORDER — SODIUM CHLORIDE 0.9% FLUSH
10.0000 mL | INTRAVENOUS | Status: DC | PRN
  Administered 2024-01-20: 10 mL

## 2024-01-20 MED ORDER — SODIUM CHLORIDE 0.9% FLUSH
10.0000 mL | Freq: Once | INTRAVENOUS | Status: AC
Start: 2024-01-20 — End: 2024-01-20
  Administered 2024-01-20: 10 mL

## 2024-01-20 MED ORDER — TRASTUZUMAB-ANNS CHEMO 150 MG IV SOLR
6.0000 mg/kg | Freq: Once | INTRAVENOUS | Status: AC
  Administered 2024-01-20: 600 mg via INTRAVENOUS
  Filled 2024-01-20: qty 28.57

## 2024-01-20 MED ORDER — HEPARIN SOD (PORK) LOCK FLUSH 100 UNIT/ML IV SOLN
500.0000 [IU] | Freq: Once | INTRAVENOUS | Status: AC | PRN
  Administered 2024-01-20: 500 [IU]

## 2024-01-20 NOTE — Patient Instructions (Signed)
 [facility] - A DEPT OF [facility]  Discharge Instructions: Thank you for choosing [facility] to provide your oncology and hematology care.   If you have a lab appointment with the Cancer Center, please go directly to the Cancer Center and check in at the registration area.   Wear comfortable clothing and clothing appropriate for easy access to any Portacath or PICC line.   We strive to give you quality time with your provider. You may need to reschedule your appointment if you arrive late (15 or more minutes).  Arriving late affects you and other patients whose appointments are after yours.  Also, if you miss three or more appointments without notifying the office, you may be dismissed from the clinic at the provider's discretion.      For prescription refill requests, have your pharmacy contact our office and allow 72 hours for refills to be completed.    Today you received the following chemotherapy and/or immunotherapy agents: trastuzumab-anns Ernestine Mcmurray)     To help prevent nausea and vomiting after your treatment, we encourage you to take your nausea medication as directed.  BELOW ARE SYMPTOMS THAT SHOULD BE REPORTED IMMEDIATELY: *FEVER GREATER THAN 100.4 F (38 C) OR HIGHER *CHILLS OR SWEATING *NAUSEA AND VOMITING THAT IS NOT CONTROLLED WITH YOUR NAUSEA MEDICATION *UNUSUAL SHORTNESS OF BREATH *UNUSUAL BRUISING OR BLEEDING *URINARY PROBLEMS (pain or burning when urinating, or frequent urination) *BOWEL PROBLEMS (unusual diarrhea, constipation, pain near the anus) TENDERNESS IN MOUTH AND THROAT WITH OR WITHOUT PRESENCE OF ULCERS (sore throat, sores in mouth, or a toothache) UNUSUAL RASH, SWELLING OR PAIN  UNUSUAL VAGINAL DISCHARGE OR ITCHING   Items with * indicate a potential emergency and should be followed up as soon as possible or go to the Emergency Department if any problems should occur.  Please show the CHEMOTHERAPY ALERT CARD  or IMMUNOTHERAPY ALERT CARD at check-in to the Emergency Department and triage nurse.  Should you have questions after your visit or need to cancel or reschedule your appointment, please contact [facility] - A DE[facility]  Dept: [phone]  and follow the prompts.  Office hours are 8:00 a.m. to 4:30 p.m. Monday - Friday. Please note that voicemails left after 4:00 p.m. may not be returned until the following business day.  We are closed weekends and major holidays. You have access to a nurse at all times for urgent questions. Please call the main number to the clinic Dept: [phone] and follow the prompts.   For any non-urgent questions, you may also contact your provider using MyChart. We now offer e-Visits for anyone 61 and older to request care online for non-urgent symptoms. For details visit [email or web address].   Also download the MyChart app! Go to the app store, search "MyChart", open the app, select [facility], and log in with your MyChart username and password.

## 2024-01-21 ENCOUNTER — Other Ambulatory Visit: Payer: Medicare Other

## 2024-01-21 ENCOUNTER — Ambulatory Visit: Payer: Medicare Other | Admitting: Hematology and Oncology

## 2024-01-21 ENCOUNTER — Ambulatory Visit: Payer: Medicare Other

## 2024-01-21 ENCOUNTER — Ambulatory Visit: Payer: Medicare Other | Admitting: Adult Health

## 2024-01-28 LAB — PANCREATIC ELASTASE, FECAL: Pancreatic Elastase-1, Stool: 635 ug/g (ref >200)

## 2024-02-07 ENCOUNTER — Telehealth: Payer: Self-pay | Admitting: Cardiology

## 2024-02-07 NOTE — Telephone Encounter (Signed)
 Pt c/o medication issue:  1. Name of Medication: tirzepatide  (MOUNJARO )   2. How are you currently taking this medication (dosage and times per day)? Not currently taking  3. Are you having a reaction (difficulty breathing--STAT)? No   4. What is your medication issue? Patient is wanting to restart this medication and is requesting a callback from the pharmacist on the process of doing so. Please advise.

## 2024-02-08 ENCOUNTER — Telehealth: Payer: Self-pay | Admitting: Pharmacy Technician

## 2024-02-08 ENCOUNTER — Other Ambulatory Visit (HOSPITAL_COMMUNITY): Payer: Self-pay

## 2024-02-08 ENCOUNTER — Encounter: Payer: Self-pay | Admitting: Hematology and Oncology

## 2024-02-08 MED ORDER — MOUNJARO 2.5 MG/0.5ML ~~LOC~~ SOAJ: 2.5000 mg | SUBCUTANEOUS | 0 refills | Status: DC

## 2024-02-08 NOTE — Telephone Encounter (Signed)
 Called pt and LVM for her to call back. She previously stopped Mounjaro  due to stomach pains, loose stools and feeling like she gained weight on the 12.5mg  dose.

## 2024-02-08 NOTE — Telephone Encounter (Signed)
 Hi, I ran a test claim for mounjaro  12.5mg  and it went through for 35.00   Maccia, Melissa D, RPH-CPP routed conversation to Rx Prior Auth Team21 minutes ago (11:01 AM)   Maccia, Melissa D, RPH-CPP21 minutes ago (11:01 AM)    I spoke with patient. She would like to retry Mounjaro . Will have team make sure PA is still good

## 2024-02-08 NOTE — Telephone Encounter (Signed)
 I spoke with patient. She would like to retry Mounjaro . Will have team make sure PA is still good

## 2024-02-10 NOTE — Assessment & Plan Note (Deleted)
 This is a very pleasant 68 yr old female with metastatic breast cancer on herceptin  maintenance who is here for follow up. Assessment & Plan Metastatic Breast Cancer Undergoing Herceptin  therapy every 21 days. Recent PET scan showed increased lymph node activity, not worrisome. No treatment changes needed. - Continue Herceptin  every 21 days. - Monitor lymph nodes for activity changes with repeat PET imaging in 3/4 months.  Anemia Hemoglobin at 10.2, stable without recent Retacrit . Above Retacrit  threshold. - Monitor CBC. - Administer Retacrit  if hemoglobin drops below 10.  Steatorrhea Greasy, mushy stools suggestive of fat malabsorption. Stool test negative for ova or parasites. Pancreatic elastase test ordered. - Submit stool sample for pancreatic elastase test. 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  At this time, we couldn't come up with a proper solution for this. 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 Reports frequent urination and incomplete bladder emptying. Plans to discuss alternatives with urologist. - Consult urologist regarding Myrbetriq dosage or alternatives.  Skin Irritation Irritation and drainage under one arm likely from sweat accumulation. Skin is raw with odor and debris. Nonmetallic deodorant recommended. - Use nonmetallic deodorant to reduce sweat and promote healing.  Follow-up Scheduled ECHO in May and oncology follow-up in April.  - Schedule ECHO on May 5th. - Attend oncology follow-up on April 25th. -- Attend infusion and port flush on May 16th.

## 2024-02-10 NOTE — Progress Notes (Deleted)
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  Neurological:  Negative for dizziness, extremity weakness, headaches and numbness.  Hematological:  Negative for adenopathy. Does not bruise/bleed easily.  Psychiatric/Behavioral:  Negative for depression. The patient is not nervous/anxious.       PHYSICAL EXAMINATION     There were no vitals filed for this visit.     Physical Exam Constitutional:      General: She is not in acute distress.    Appearance: Normal appearance. She is not toxic-appearing.  HENT:     Head: Normocephalic and atraumatic.     Mouth/Throat:     Mouth: Mucous membranes are moist.     Pharynx: Oropharynx is clear. No oropharyngeal exudate or posterior oropharyngeal erythema.  Eyes:     General: No scleral icterus. Cardiovascular:     Rate and Rhythm: Normal rate and regular rhythm.     Pulses: Normal pulses.     Heart sounds: Normal heart sounds.  Pulmonary:     Effort: Pulmonary effort is normal.     Breath  sounds: Normal breath sounds.  Abdominal:     General: Abdomen is flat. Bowel sounds are normal. There is no distension.     Palpations: Abdomen is soft.     Tenderness: There is no abdominal tenderness.  Musculoskeletal:        General: Swelling (BLE noted) present.     Cervical back: Neck supple.  Lymphadenopathy:     Cervical: No cervical adenopathy.  Skin:    General: Skin is warm and dry.     Findings: No rash.  Neurological:     General: No focal deficit present.     Mental Status: She is alert.     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        Behavior: Behavior normal.     LABORATORY DATA:  CBC    Component Value Date/Time   WBC 5.4 01/20/2024 0909  WBC 6.4 09/27/2020 1114   RBC 3.59 (L) 01/20/2024 0909   HGB 10.2 (L) 01/20/2024 0909   HGB 11.9 03/30/2017 1044   HCT 32.1 (L) 01/20/2024 0909   HCT 37.4 03/30/2017 1044   PLT 200 01/20/2024 0909   PLT 238 03/30/2017 1044   MCV 89.4 01/20/2024 0909   MCV 85.0 03/30/2017 1044   MCH 28.4 01/20/2024 0909   MCHC 31.8 01/20/2024 0909   RDW 13.7 01/20/2024 0909   RDW 15.1 (H) 03/30/2017 1044   LYMPHSABS 1.4 01/20/2024 0909   LYMPHSABS 1.3 03/30/2017 1044   MONOABS 0.7 01/20/2024 0909   MONOABS 0.5 03/30/2017 1044   EOSABS 0.1 01/20/2024 0909   EOSABS 0.0 03/30/2017 1044   BASOSABS 0.0 01/20/2024 0909   BASOSABS 0.0 03/30/2017 1044    CMP     Component Value Date/Time   NA 140 01/20/2024 0909   NA 143 03/30/2017 1044   K 3.8 01/20/2024 0909   K 3.8 03/30/2017 1044   CL 104 01/20/2024 0909   CL 106 01/18/2013 0909   CO2 30 01/20/2024 0909   CO2 27 03/30/2017 1044   GLUCOSE 151 (H) 01/20/2024 0909   GLUCOSE 91 03/30/2017 1044   GLUCOSE 99 01/18/2013 0909   BUN 27 (H) 01/20/2024 0909   BUN 9.7 03/30/2017 1044   CREATININE 1.06 (H) 01/20/2024 0909   CREATININE 0.8 03/30/2017 1044   CALCIUM  9.4  01/20/2024 0909   CALCIUM  9.7 03/30/2017 1044   PROT 8.0 01/20/2024 0909   PROT 8.0 03/30/2017 1044   ALBUMIN 3.8 01/20/2024 0909   ALBUMIN 3.8 01/27/2021 1232   ALBUMIN 3.4 (L) 03/30/2017 1044   AST 15 01/20/2024 0909   AST 17 03/30/2017 1044   ALT 11 01/20/2024 0909   ALT 15 03/30/2017 1044   ALKPHOS 46 01/20/2024 0909   ALKPHOS 60 03/30/2017 1044   BILITOT 0.4 01/20/2024 0909   BILITOT 0.34 03/30/2017 1044   GFRNONAA 58 (L) 01/20/2024 0909   GFRAA >60 07/11/2020 0923    ASSESSMENT and THERAPY PLAN:   No problem-specific Assessment & Plan notes found for this encounter.      All questions were answered. The patient knows to call the clinic with any problems, questions or concerns. We can certainly see the patient much sooner if necessary.  Total encounter time:30 minutes*in face-to-face visit time, chart review, lab review, care coordination, order entry, and documentation of the encounter time.   *Total Encounter Time as defined by the Centers for Medicare and Medicaid Services includes, in addition to the face-to-face time of a patient visit (documented in the note above) non-face-to-face time: obtaining and reviewing outside history, ordering and reviewing medications, tests or procedures, care coordination (communications with other health care professionals or caregivers) and documentation in the medical record.

## 2024-02-11 ENCOUNTER — Inpatient Hospital Stay: Payer: Medicare Other | Admitting: Hematology and Oncology

## 2024-02-11 ENCOUNTER — Inpatient Hospital Stay: Payer: Medicare Other

## 2024-02-11 ENCOUNTER — Encounter: Payer: Self-pay | Admitting: Hematology and Oncology

## 2024-02-11 VITALS — BP 131/61 | HR 65 | Temp 97.9°F | Resp 16 | Wt 242.0 lb

## 2024-02-11 DIAGNOSIS — C50112 Malignant neoplasm of central portion of left female breast: Secondary | ICD-10-CM

## 2024-02-11 DIAGNOSIS — Z95828 Presence of other vascular implants and grafts: Secondary | ICD-10-CM

## 2024-02-11 DIAGNOSIS — Z5112 Encounter for antineoplastic immunotherapy: Secondary | ICD-10-CM | POA: Diagnosis not present

## 2024-02-11 DIAGNOSIS — Z171 Estrogen receptor negative status [ER-]: Secondary | ICD-10-CM

## 2024-02-11 LAB — CBC WITH DIFFERENTIAL (CANCER CENTER ONLY)
Abs Immature Granulocytes: 0.02 10*3/uL (ref 0.00–0.07)
Basophils Absolute: 0 10*3/uL (ref 0.0–0.1)
Basophils Relative: 1 %
Eosinophils Absolute: 0.1 10*3/uL (ref 0.0–0.5)
Eosinophils Relative: 2 %
HCT: 31.5 % — ABNORMAL LOW (ref 36.0–46.0)
Hemoglobin: 10 g/dL — ABNORMAL LOW (ref 12.0–15.0)
Immature Granulocytes: 0 %
Lymphocytes Relative: 28 %
Lymphs Abs: 1.5 10*3/uL (ref 0.7–4.0)
MCH: 28 pg (ref 26.0–34.0)
MCHC: 31.7 g/dL (ref 30.0–36.0)
MCV: 88.2 fL (ref 80.0–100.0)
Monocytes Absolute: 0.6 10*3/uL (ref 0.1–1.0)
Monocytes Relative: 12 %
Neutro Abs: 3.1 10*3/uL (ref 1.7–7.7)
Neutrophils Relative %: 57 %
Platelet Count: 210 10*3/uL (ref 150–400)
RBC: 3.57 MIL/uL — ABNORMAL LOW (ref 3.87–5.11)
RDW: 13.6 % (ref 11.5–15.5)
WBC Count: 5.4 10*3/uL (ref 4.0–10.5)
nRBC: 0 % (ref 0.0–0.2)

## 2024-02-11 LAB — CMP (CANCER CENTER ONLY)
ALT: 13 U/L (ref 0–44)
AST: 17 U/L (ref 15–41)
Albumin: 3.9 g/dL (ref 3.5–5.0)
Alkaline Phosphatase: 42 U/L (ref 38–126)
Anion gap: 5 (ref 5–15)
BUN: 29 mg/dL — ABNORMAL HIGH (ref 8–23)
CO2: 31 mmol/L (ref 22–32)
Calcium: 9.4 mg/dL (ref 8.9–10.3)
Chloride: 104 mmol/L (ref 98–111)
Creatinine: 1.18 mg/dL — ABNORMAL HIGH (ref 0.44–1.00)
GFR, Estimated: 51 mL/min — ABNORMAL LOW (ref >60)
Glucose, Bld: 146 mg/dL — ABNORMAL HIGH (ref 70–99)
Potassium: 3.7 mmol/L (ref 3.5–5.1)
Sodium: 140 mmol/L (ref 135–145)
Total Bilirubin: 0.3 mg/dL (ref 0.0–1.2)
Total Protein: 8 g/dL (ref 6.5–8.1)

## 2024-02-11 MED ORDER — SODIUM CHLORIDE 0.9 % IV SOLN: Freq: Once | INTRAVENOUS | Status: AC

## 2024-02-11 MED ORDER — SODIUM CHLORIDE 0.9% FLUSH
10.0000 mL | Freq: Once | INTRAVENOUS | Status: AC
  Administered 2024-02-11: 10 mL

## 2024-02-11 MED ORDER — ACETAMINOPHEN 325 MG PO TABS
650.0000 mg | ORAL_TABLET | Freq: Once | ORAL | Status: AC
  Administered 2024-02-11: 650 mg via ORAL
  Filled 2024-02-11: qty 2

## 2024-02-11 MED ORDER — TRASTUZUMAB-ANNS CHEMO 150 MG IV SOLR
6.0000 mg/kg | Freq: Once | INTRAVENOUS | Status: AC
  Administered 2024-02-11: 600 mg via INTRAVENOUS
  Filled 2024-02-11: qty 28.57

## 2024-02-11 MED ORDER — DIPHENHYDRAMINE HCL 25 MG PO CAPS
25.0000 mg | ORAL_CAPSULE | Freq: Once | ORAL | Status: AC
  Administered 2024-02-11: 25 mg via ORAL
  Filled 2024-02-11: qty 1

## 2024-02-11 NOTE — Patient Instructions (Signed)
 [facility] - A DEPT OF [facility]  Discharge Instructions: Thank you for choosing [facility] to provide your oncology and hematology care.   If you have a lab appointment with the Cancer Center, please go directly to the Cancer Center and check in at the registration area.   Wear comfortable clothing and clothing appropriate for easy access to any Portacath or PICC line.   We strive to give you quality time with your provider. You may need to reschedule your appointment if you arrive late (15 or more minutes).  Arriving late affects you and other patients whose appointments are after yours.  Also, if you miss three or more appointments without notifying the office, you may be dismissed from the clinic at the provider's discretion.      For prescription refill requests, have your pharmacy contact our office and allow 72 hours for refills to be completed.    Today you received the following chemotherapy and/or immunotherapy agents: trastuzumab-anns [name])     To help prevent nausea and vomiting after your treatment, we encourage you to take your nausea medication as directed.  BELOW ARE SYMPTOMS THAT SHOULD BE REPORTED IMMEDIATELY: *FEVER GREATER THAN 100.4 F (38 C) OR HIGHER *CHILLS OR SWEATING *NAUSEA AND VOMITING THAT IS NOT CONTROLLED WITH YOUR NAUSEA MEDICATION *UNUSUAL SHORTNESS OF BREATH *UNUSUAL BRUISING OR BLEEDING *URINARY PROBLEMS (pain or burning when urinating, or frequent urination) *BOWEL PROBLEMS (unusual diarrhea, constipation, pain near the anus) TENDERNESS IN MOUTH AND THROAT WITH OR WITHOUT PRESENCE OF ULCERS (sore throat, sores in mouth, or a toothache) UNUSUAL RASH, SWELLING OR PAIN  UNUSUAL VAGINAL DISCHARGE OR ITCHING   Items with * indicate a potential emergency and should be followed up as soon as possible or go to the Emergency Department if any problems should occur.  Please show the CHEMOTHERAPY ALERT CARD  or IMMUNOTHERAPY ALERT CARD at check-in to the Emergency Department and triage nurse.  Should you have questions after your visit or need to cancel or reschedule your appointment, please contact [facility] - A DEPT OF [facility]  Dept: [phone]  and follow the prompts.  Office hours are 8:00 a.m. to 4:30 p.m. Monday - Friday. Please note that voicemails left after 4:00 p.m. may not be returned until the following business day.  We are closed weekends and major holidays. You have access to a nurse at all times for urgent questions. Please call the main number to the clinic Dept: [phone] and follow the prompts.   For any non-urgent questions, you may also contact your provider using MyChart. We now offer e-Visits for anyone 61 and older to request care online for non-urgent symptoms. For details visit [email or web address].   Also download the MyChart app! Go to the app store, search "MyChart", open the app, select [facility], and log in with your MyChart username and password.

## 2024-02-16 ENCOUNTER — Encounter (HOSPITAL_BASED_OUTPATIENT_CLINIC_OR_DEPARTMENT_OTHER): Attending: Internal Medicine | Admitting: Internal Medicine

## 2024-02-16 DIAGNOSIS — L308 Other specified dermatitis: Secondary | ICD-10-CM | POA: Insufficient documentation

## 2024-02-16 DIAGNOSIS — B372 Candidiasis of skin and nail: Secondary | ICD-10-CM | POA: Insufficient documentation

## 2024-02-18 ENCOUNTER — Encounter: Payer: Self-pay | Admitting: Hematology and Oncology

## 2024-02-21 ENCOUNTER — Ambulatory Visit (HOSPITAL_COMMUNITY)
Admission: RE | Admit: 2024-02-21 | Discharge: 2024-02-21 | Disposition: A | Discharge status: Home / Self Care | Payer: 59 | Source: Ambulatory Visit | Attending: Cardiology | Admitting: Cardiology

## 2024-02-21 DIAGNOSIS — I340 Nonrheumatic mitral (valve) insufficiency: Secondary | ICD-10-CM | POA: Insufficient documentation

## 2024-02-21 DIAGNOSIS — E785 Hyperlipidemia, unspecified: Secondary | ICD-10-CM | POA: Insufficient documentation

## 2024-02-21 DIAGNOSIS — Z5181 Encounter for therapeutic drug level monitoring: Secondary | ICD-10-CM | POA: Insufficient documentation

## 2024-02-21 DIAGNOSIS — I5032 Chronic diastolic (congestive) heart failure: Secondary | ICD-10-CM

## 2024-02-21 DIAGNOSIS — Z171 Estrogen receptor negative status [ER-]: Secondary | ICD-10-CM | POA: Diagnosis not present

## 2024-02-21 DIAGNOSIS — I119 Hypertensive heart disease without heart failure: Secondary | ICD-10-CM | POA: Diagnosis not present

## 2024-02-21 DIAGNOSIS — G4730 Sleep apnea, unspecified: Secondary | ICD-10-CM | POA: Diagnosis not present

## 2024-02-21 DIAGNOSIS — E119 Type 2 diabetes mellitus without complications: Secondary | ICD-10-CM | POA: Insufficient documentation

## 2024-02-21 DIAGNOSIS — C50112 Malignant neoplasm of central portion of left female breast: Secondary | ICD-10-CM | POA: Diagnosis not present

## 2024-02-21 LAB — ECHOCARDIOGRAM COMPLETE
AR max vel: 1.89 cm2
AV Area VTI: 2.41 cm2
AV Area mean vel: 2.04 cm2
AV Mean grad: 3 mmHg
AV Peak grad: 6.3 mmHg
Ao pk vel: 1.25 m/s
Area-P 1/2: 5.13 cm2
Calc EF: 63.7 %
MV VTI: 2.75 cm2
S' Lateral: 3 cm
Single Plane A2C EF: 67.3 %
Single Plane A4C EF: 58.8 %

## 2024-02-21 NOTE — Progress Notes (Signed)
  Echocardiogram 2D Echocardiogram has been performed.  Graylyn Bunney L Ayrabella Labombard RDCS 02/21/2024, 10:45 AM

## 2024-02-24 ENCOUNTER — Ambulatory Visit (INDEPENDENT_AMBULATORY_CARE_PROVIDER_SITE_OTHER): Admitting: Podiatry

## 2024-02-24 ENCOUNTER — Telehealth: Payer: Self-pay | Admitting: Podiatry

## 2024-02-24 ENCOUNTER — Ambulatory Visit (INDEPENDENT_AMBULATORY_CARE_PROVIDER_SITE_OTHER)

## 2024-02-24 DIAGNOSIS — M76821 Posterior tibial tendinitis, right leg: Secondary | ICD-10-CM

## 2024-02-24 DIAGNOSIS — M19071 Primary osteoarthritis, right ankle and foot: Secondary | ICD-10-CM | POA: Diagnosis not present

## 2024-02-24 DIAGNOSIS — M722 Plantar fascial fibromatosis: Secondary | ICD-10-CM | POA: Diagnosis not present

## 2024-02-24 MED ORDER — TRIAMCINOLONE ACETONIDE 10 MG/ML IJ SUSP
5.0000 mg | Freq: Once | INTRAMUSCULAR | Status: AC
  Administered 2024-02-24: 5 mg

## 2024-02-24 MED ORDER — DICLOFENAC SODIUM 1 % EX GEL: 2.0000 g | Freq: Four times a day (QID) | CUTANEOUS | 2 refills | Status: AC

## 2024-02-24 NOTE — Patient Instructions (Signed)
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
## 2024-02-24 NOTE — Telephone Encounter (Signed)
 Pt received a message stating that the diclofenac  Sodium prescribed is not covered by her insurance. She would like to know if another medication can be prescribed instead.

## 2024-02-27 ENCOUNTER — Encounter: Payer: Self-pay | Admitting: Podiatry

## 2024-03-01 ENCOUNTER — Telehealth: Payer: Self-pay | Admitting: Pharmacist

## 2024-03-01 MED ORDER — MOUNJARO 5 MG/0.5ML ~~LOC~~ SOAJ: 5.0000 mg | SUBCUTANEOUS | 0 refills | Status: DC

## 2024-03-01 NOTE — Telephone Encounter (Signed)
 Patient doing well on Mounjaro  2.5 mg weekly.  She has 1 week left.  Interested in increasing to 5.  Will send Rx to pharmacy and follow-up in another month.

## 2024-03-02 ENCOUNTER — Other Ambulatory Visit: Payer: Self-pay | Admitting: Podiatry

## 2024-03-02 ENCOUNTER — Encounter (HOSPITAL_BASED_OUTPATIENT_CLINIC_OR_DEPARTMENT_OTHER): Attending: Internal Medicine | Admitting: Internal Medicine

## 2024-03-02 DIAGNOSIS — B372 Candidiasis of skin and nail: Secondary | ICD-10-CM | POA: Insufficient documentation

## 2024-03-02 DIAGNOSIS — L308 Other specified dermatitis: Secondary | ICD-10-CM | POA: Insufficient documentation

## 2024-03-02 DIAGNOSIS — Z09 Encounter for follow-up examination after completed treatment for conditions other than malignant neoplasm: Secondary | ICD-10-CM | POA: Diagnosis not present

## 2024-03-02 NOTE — Progress Notes (Signed)
 Patient Care Team: Roslyn Coombe, MD as PCP - General [name] Jolinda Necessary, MD as PCP - Cardiology (Cardiology) Liliane Rei, MD as Consulting Physician (Orthopedic Surgery) Alphonso Aschoff, MD as Consulting Physician (Gynecologic Oncology) Dareen Ebbing, MD as Consulting Physician (General Surgery) Alane Hsu, RN as Oncology Nurse Navigator Auther Bo, RN as Oncology Nurse Navigator Darlis Eisenmenger, MD as Consulting Physician (Cardiology) Audie Bleacher, MD as Consulting Physician (Neurosurgery) Lyanne Sample, MD as Consulting Physician (Orthopedic Surgery) Daryel Ensign, DO as Consulting Physician (Neurology) Nicholas Bari, MD as Consulting Physician (Rheumatology) Prescott Brodie, MD (Inactive) as Consulting Physician (Otolaryngology) Murleen Arms, MD as Medical Oncologist (Hematology and Oncology) Armbruster, [name], MD as Consulting Physician (Gastroenterology)  Clinic Day:  03/13/2024  Referring physician: Roslyn Coombe, MD  ASSESSMENT & PLAN:   Assessment & Plan: Malignant neoplasm of upper-outer quadrant of left breast in female, estrogen receptor negative (HCC) She is status post left lumpectomy and sentinel lymph node dissection April of 2012 for a T1b N1(mic) stage IB invasive ductal carcinoma, grade 1, estrogen receptor 82% and progesterone receptor 92% positive, with no HER-2 amplification, and an MIB-1-1 of 17%, The patient's Oncotype DX score of 21 predicted a 13% risk of distant recurrence after 5 years of tamoxifen . She is status post radiation completed August of 2012, On tamoxifen  from September of 2012 to April 2014. The plan had been to initiate anastrozole  in April 2014, but the patient had a menstrual cycle in May 2014, and resumed tamoxifen . She did discontinue tamoxifen  on her own initiative June 2015 because of "aches and pains".  She resumed tamoxifen  December 2015. Tamoxifen  was later discontinued February 2016 at patient's discretion. She  continues to receive Herceptin  infusions every 3 weeks for maintenance therapy.  - 02/21/2024-reviewed echocardiogram showing normal LVEF and normal left heart strain. -03/03/2024 -proceed with scheduled Herceptin  infusion.  Continue with Herceptin  infusions every 3 weeks as scheduled.    Wheezing Patient has noted some wheezing, experienced as a "whistle" in her throat.  This has been present for couple of weeks.  She denies shortness of breath that is outside of her baseline.  Oxygen saturations are 100%.  She did use to take Clarinex  for control of allergy symptoms.  Has not taken this in some time.  Will get chest x-ray to evaluate for active cardiopulmonary disease.  Recommend she restart Clarinex .  New prescription sent to the pharmacy.  Plan Labs reviewed. -Stable anemia with Hgb 10.6 and HCT 32.8. - Stable reduced renal functions with BUN 37, creatinine 1.16, GFR 52. Will get chest x-ray to evaluate for active cardiopulmonary disease. Reviewed echocardiogram done on 02/21/2024.  She has shown normal LVEF and normal left heart strain. Likely patient condition are adequate to proceed with Herceptin  today. Labs/flush, follow-up, and next treatment in 3 weeks.    The patient understands the plans discussed today and is in agreement with them.  She knows to contact our office if she develops concerns prior to her next appointment.  I provided 25 minutes of face-to-face time during this encounter and > 50% was spent counseling as documented under my assessment and plan.    Jacqueline Deis, NP  [facility] - A DEPT OF [facility] [name]. [facility] [address] [name] [address] Dept: [phone] Dept Fax: [phone]   Orders Placed This Encounter  Procedures   DG Chest 2 View    Standing Status:  Future    Number of Occurrences:   1    Expected Date:   03/03/2024    Expiration Date:   03/03/2025    Reason for Exam (SYMPTOM  OR  DIAGNOSIS REQUIRED):   wheezing    Preferred imaging location?:   [facility]      CHIEF COMPLAINT:  CC: left breast cancer, estrogen receptor negative   Current Treatment:  herceptin  every 3 weeks  INTERVAL HISTORY:  [name] is here today for repeat clinical assessment. Was last seen by Dr. Arno Young on 01/20/2024.  She states for the past couple weeks she has noticed a "whistle" with a lump in her throat area.  Denies any new shortness of breath.  She does have a history of seasonal allergies.  She had echocardiogram 02/21/2024. She has normal LVF and normal left heart strain. She denies chest pain, chest pressure, or shortness of breath. She denies headaches or visual disturbances. He denies abdominal pain, nausea, vomiting, or changes in bowel or bladder habits.She denies fevers or chills. She denies pain. Her appetite is good. Her weight has been stable.  I have reviewed the past medical history, past surgical history, social history and family history with the patient and they are unchanged from previous note.  ALLERGIES:  is allergic to morphine and codeine, codeine, darvon, duloxetine  hcl, hydrocodone , oxycodone , pravastatin , and rosuvastatin .  MEDICATIONS:  Current Outpatient Medications  Medication Sig Dispense Refill   acetaminophen  (TYLENOL ) 500 MG tablet Take 500 mg by mouth every 6 (six) hours as needed for moderate pain (pain score 4-6).     Ascorbic Acid  (VITAMIN C PO) Take 1 tablet by mouth daily.     carvedilol  (COREG ) 6.25 MG tablet TAKE 1 TABLET BY MOUTH TWICE A DAY 60 tablet 11   Cholecalciferol (VITAMIN D3 PO) Take 1 capsule by mouth daily.     Cyanocobalamin  (B-12 PO) Take 1 capsule by mouth daily.     desloratadine  (CLARINEX ) 5 MG tablet Take 5 mg by mouth as needed.     diclofenac  Sodium (VOLTAREN ) 1 % GEL Apply 2 g topically 4 (four) times daily. Rub into affected area of foot 2 to 4 times daily 100 g 2   fluticasone  (FLONASE ) 50 MCG/ACT nasal spray Place 2 sprays  into both nostrils daily. 16 g 6   furosemide  (LASIX ) 40 MG tablet Take 1.5 tablets (60 mg total) by mouth daily. 45 tablet 5   gabapentin  (NEURONTIN ) 300 MG capsule Take 2 capsules (600 mg total) by mouth at bedtime as needed (pain). 60 capsule 1   ketoconazole  (NIZORAL ) 2 % cream Apply 1 Application topically daily. 30 g 3   Magnesium  Oxide 400 MG CAPS Take 1 capsule (400 mg total) by mouth daily. 30 capsule 11   methylcellulose oral powder Take 1 packet by mouth daily.     Multiple Vitamins-Minerals (MULTIVITAMIN WITH MINERALS) tablet Take 1 tablet by mouth daily.     nystatin  (MYCOSTATIN /NYSTOP ) powder Apply 1 Application topically 3 (three) times daily. 60 g 0   potassium chloride  SA (KLOR-CON  M20) 20 MEQ tablet Take 1 tablet (20 mEq total) by mouth 2 (two) times daily. 60 tablet 6   tirzepatide  (MOUNJARO ) 5 MG/0.5ML Pen Inject 5 mg into the skin once a week. 2 mL 0   XARELTO  20 MG TABS tablet TAKE 1 TABLET BY MOUTH DAILY WITH SUPPER. 30 tablet 11   No current facility-administered medications for this visit.   Facility-Administered Medications Ordered in Other Visits  Medication Dose  Route Frequency Provider Last Rate Last Admin   cloNIDine  (CATAPRES ) tablet 0.1 mg  0.1 mg Oral Daily Tanner, Van E., PA-C   0.1 mg at 11/21/19 1742   heparin  lock flush 100 unit/mL  500 Units Intracatheter Once Iruku, Praveena, MD        HISTORY OF PRESENT ILLNESS:   Oncology History Overview Note  A: INVASIVE DUCTAL CARCINOMA LEFT BREAST (1)  status post left lumpectomy and sentinel lymph node dissection April of 2012 for a T1b N1(mic) stage IB invasive ductal carcinoma, grade 1, estrogen receptor 82% and progesterone receptor 92% positive, with no HER-2 amplification, and an MIB-1-1 of 17%,   (2)  The patient's Oncotype DX score of 21 predicted a 13% risk of distant recurrence after 5 years of tamoxifen .  (3)  status post radiation completed August of 2012,   (4)  on tamoxifen  from September of  2012 to April 2014  (5) the plan had been to initiate anastrozole  in April 2014, but the patient had a menstrual cycle in May 2014, and resumed tamoxifen .  (a) discontinued tamoxifen  on her own initiative June 2015 because of "aches and pains".  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  This has been present approximately a year.  The study found a new 3.5 cm area of focal asymmetry with amorphous calcification in the left breast upper outer quadrant.  Left breast ultrasonography on the same day found a 2.7 cm region with indistinct margins which was slightly hypoechoic.  This was palpable as a mass in the upper outer aspect of the breast.  Biopsy of this area obtained 04/28/2019 202 found ([id number] [id number]) ductal carcinoma in situ, grade 3, estrogen and progesterone receptor negative.  She met with surgery and plastics and Dr. Cherlynn Cornfield recommended mastectomy.  Dr. Marieta Shorten suggested late reconstruction.  She saw me on 06/02/2019 and I set her up for genetics testing and agreed with mastectomy.  We also discussed weight loss management issues at that time.  Genetics testing was done and showed no pathogenic mutations.  However surgery was not performed.  She tells me she was not called back but also admits "it is partly my fault"  since she had mixed feelings about the surgery and she herself did not follow-up with her doctors to get a definitive plan.  She had an appointment here on 09/04/2019 which she canceled.  Instead the next note I have in the record after August 2020 is from Dr. Eli Grizzle dated 09/22/2019.  He confirmed a palpable mass in the left upper outer quadrant at 2:00 measuring about 2.5 cm.  There was no nipple retraction or skin dimpling.  He palpated a mass in the left axilla.  He again discussed mastectomy with the patient but he also set her up for left diagnostic mammography at [facility], performed 10/25/2019.  In the breast there are pleomorphic calcifications associated with the prior biopsy clip sites and a new  0.5 cm mass surrounding the coil clip at 2:00.  In addition there were 2 new enlarged abnormal left axillary lymph nodes.  Ultrasound-guided biopsy was obtained 10/30/2019 and showed ([id number] [id number]) invasive mammary carcinoma, grade 3. Prognostic indicators significant for: ER, 80% positive with weak staining intensity and PR, 0% negative. Proliferation marker Ki67 at 70%. HER2 positive (3+).  (10) genetics testing 06/14/2019 through the Multi-Gene Panel offered by [facility] found no deleterious mutations in AIP, ALK, APC, ATM, AXIN2,BAP1,  BARD1, BLM, BMPR1A, BRCA1, BRCA2, BRIP1, CASR, CDC73, CDH1, CDK4, CDKN1B, CDKN1C, CDKN2A (p14ARF), CDKN2A (p16INK4a), CEBPA, CHEK2, CTNNA1, DICER1, DIS3L2, EGFR (c.2369C>T, p.Thr790Met variant only), EPCAM (Deletion/duplication testing only), FH, FLCN, GATA2, GPC3, GREM1 (Promoter region deletion/duplication testing only), HOXB13 (c.251G>A, p.Gly84Glu), HRAS, KIT, MAX, MEN1, MET, MITF (c.952G>A, p.Glu318Lys variant only), MLH1, MSH2, MSH3, MSH6, MUTYH, NBN, NF1, NF2, NTHL1, PALB2, PDGFRA, PHOX2B, PMS2, POLD1, POLE, POT1, PRKAR1A, PTCH1, PTEN, RAD50, RAD51C, RAD51D, RB1, RECQL4, RET, RNF43, RUNX1, SDHAF2, SDHA (sequence changes only), SDHB, SDHC, SDHD, SMAD4, SMARCA4, SMARCB1,  SMARCE1, STK11, SUFU, TERC, TERT, TMEM127, TP53, TSC1, TSC2, VHL, WRN and WT1.    C: METASTATIC BREAST CANCER: JAN 2021 (11) left axillary lymph node biopsy 10/30/2019 documents invasive mammary carcinoma, grade 3, estrogen receptor positive (80%, weak), progesterone receptor negative, HER-2 amplified (3+) MIB-70%  (a) breast MRI 11/23/2019 shows 3.6 cm non-mass-like enhancement in the left breast, with a second more clumped area measuring 4.8 cm, and at least 5 morphologically abnormal left axillary lymph node.  There is a left subpectoral lymph node and a left internal mammary lymph node noted as well.  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  (b) pertuzumab  held with cycle 2 because of persistent diarrhea  (c) anti-HER2 therapy held after cycle 4 because of a drop in EF  (d) carbo/gemzar  stopped after cycle 5, last dose 02/13/2020  (13) left breast lumpectomy and axillary node dissection on 04/10/2020 showed a residual ypT0 ypN1    (a) 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  (b) a total of 2 left axillary lymph nodes removed, one positive  (14) anti-HER-2 treatment resumed after surgery to be continued indefinitely  (a) echo 11/09/2019 shows an ejection fraction in the 60-65% range  (b) echo 02/09/2020 shows an ejection fraction in the 45 to 50% range  (c) echo 03/26/2020 shows an ejection fraction in the 55-60% range  (d) trastuzumab  resumed 03/28/2020  (e) echo 05/06/2020 shows an ejection fraction in the 55-60% range  (f) trastuzumab  discontinued after  06/20/2020 dose with progression  (14) switched to TDM-1/Kadcyla  starting 07/11/2020  (a) new baseline PET scan 07/01/2020 shows significant supraclavicular, mediastinal and prevascular adenopathy with new small left pleural and pericardial effusions  (b) echo 06/28/2020 shows and ejection fraction in the 55-60% range  (c) PET scan on 08/15/2020 shows interval response to chemotherapy with decrease in size and SUV of lymphadenopathy, no new or progressive disease identified in abdomen, pelvis, or bones  (d) switched back to Herceptin /trastuzumab   as of 10/24/2020 secondary to patient's concerns regarding possible neuropathy  (e) repeat echocardiography 11/21/2020 shows an ejection fraction in the 55% range  (f) echocardiogram  02/17/2021 shows an ejection fraction in the 50-55% range  (g) brain MRI 11/28/2020 shows no evidence of metastatic disease  (h) PET scan 02/17/2021 shows significant response in the mediastinal adenopathy and left lateral breast mass; no lung, liver or bone lesions  (i) PET scan 06/26/2021 shows further decrease in the size and SUV of the left-sided lumpectomy, and no findings for hypermetabolic disease elsewhere; there are some reactive pelvic nodes felt secondary to obesity  (j) echo 04/30/2021 and 08/04/2021 showing no change in ejection fraction  (14) did not receive adjuvant radiation (had radiation previously 2012).  (16) left lower extremity DVT documented by Doppler ultrasound 05/31/2020  (a) rivaroxaban /Xarelto  started 05/31/2020  (17) osteoarthritis/ degenerative disease  (a) cervical spine MRI 09/08/2020 showed a herniated nucleus pulposus at cervical 4/5, no evidence of metastatic disease within the cervical spine  (b) lumbar MRI 09/27/2020 showed significant degenerative disease, no evidence of metastasis  (c) status post anterior cervical decompression C4/5 with arthrodesis 10/02/2020 ([place])  (18) bilateral carpal tunnel syndrome documented by  electromyography 08/05/2020, severe on the right, moderate on the left [name])   Malignant neoplasm of central portion of left breast in female, estrogen receptor negative (HCC)  06/12/2011 Initial Diagnosis   Cancer of central portion of left female breast (HCC)   06/14/2019 Genetic Testing   Negative genetic testing on the multicancer panel.  The Multi-Gene Panel offered by [facility] includes sequencing and/or deletion duplication testing of the following 85 genes: AIP, ALK, APC, ATM, AXIN2,BAP1,  BARD1, BLM, BMPR1A, BRCA1, BRCA2, BRIP1, CASR, CDC73, CDH1, CDK4, CDKN1B, CDKN1C, CDKN2A (p14ARF), CDKN2A (p16INK4a), CEBPA, CHEK2, CTNNA1, DICER1, DIS3L2, EGFR (c.2369C>T, p.Thr790Met variant only), EPCAM (Deletion/duplication testing only), FH, FLCN, GATA2, GPC3, GREM1 (Promoter region deletion/duplication testing only), HOXB13 (c.251G>A, p.Gly84Glu), HRAS, KIT, MAX, MEN1, MET, MITF (c.952G>A, p.Glu318Lys variant only), MLH1, MSH2, MSH3, MSH6, MUTYH, NBN, NF1, NF2, NTHL1, PALB2, PDGFRA, PHOX2B, PMS2, POLD1, POLE, POT1, PRKAR1A, PTCH1, PTEN, RAD50, RAD51C, RAD51D, RB1, RECQL4, RET, RNF43, RUNX1, SDHAF2, SDHA (sequence changes only), SDHB, SDHC, SDHD, SMAD4, SMARCA4, SMARCB1, SMARCE1, STK11, SUFU, TERC, TERT, TMEM127, TP53, TSC1, TSC2, VHL, WRN and WT1.  The report date is June 14, 2019.   10/24/2020 Cancer Staging   Staging form: Breast, AJCC 7th Edition - Pathologic: Stage IV (M1) - Signed by Willy Harvest, MD on 10/24/2020   10/24/2020 - 05/21/2022 Chemotherapy   Patient is on Treatment Plan : BREAST Trastuzumab  q21d     06/10/2022 -  Chemotherapy   Patient is on Treatment Plan : BREAST Trastuzumab   q21d x 13 cycles     Malignant neoplasm of upper-outer quadrant of left breast in female, estrogen receptor negative (HCC) (Resolved)  10/19/2010 Initial Diagnosis   Malignant neoplasm of upper-outer quadrant of left breast in female, estrogen receptor negative (HCC)   04/28/2019 Cancer Staging   Staging form:  Breast, AJCC 8th Edition - Clinical stage from 04/28/2019: Stage 0 (cTis (DCIS), cN0, cM0, ER-, PR-) - Signed by Percival Brace, NP on 05/17/2019   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 in female, estrogen receptor negative (HCC)  10/30/2019 Cancer Staging   Staging form: Breast, AJCC 8th Edition - Clinical stage from 10/30/2019: Stage IIA (cT1, cN1, cM0, G2, ER+, PR-, HER2+) - Signed by Murleen Arms, MD on 07/16/2023 Stage prefix: Initial diagnosis Histologic grading system: 3 grade system   10/24/2020 - 05/21/2022 Chemotherapy   Patient is on Treatment Plan : BREAST Trastuzumab  q21d     06/10/2022 -  Chemotherapy   Patient  is on Treatment Plan : BREAST Trastuzumab   q21d x 13 cycles         REVIEW OF SYSTEMS:   Constitutional: Denies fevers, chills or abnormal weight loss Eyes: Denies blurriness of vision Ears, nose, mouth, throat, and face: Denies mucositis or sore throat Respiratory: Denies cough or dyspnea. Has noted a "whistle" noise in her throat over the past few weeks.  Cardiovascular: Denies palpitation, chest discomfort or lower extremity swelling Gastrointestinal:  Denies nausea, heartburn or change in bowel habits Skin: Denies abnormal skin rashes Lymphatics: Denies new lymphadenopathy or easy bruising Neurological:Denies numbness, tingling or new weaknesses Behavioral/Psych: Mood is stable, no new changes  All other systems were reviewed with the patient and are negative.   VITALS:   Today's Vitals   03/03/24 0951 03/03/24 0953  BP: 122/70   Pulse: 66   Resp: (!) 21   Temp: 98.1 F (36.7 C)   TempSrc: Temporal   SpO2: 100%   Weight: 243 lb 4.8 oz (110.4 kg)   Height: 4\' 11"  (1.499 m)   PainSc:  0-No pain   Body mass index is 49.14 kg/m.   Wt Readings from Last 3 Encounters:  03/03/24 243 lb 4.8 oz (110.4 kg)  02/11/24 242 lb (109.8 kg)  01/20/24 241 lb (109.3 kg)    Body mass index is 49.14 kg/m.  Performance status (ECOG):  1 - Symptomatic but completely ambulatory  PHYSICAL EXAM:   GENERAL:alert, no distress and comfortable SKIN: skin color, texture, turgor are normal, no rashes or significant lesions EYES: normal, Conjunctiva are pink and non-injected, sclera clear OROPHARYNX:no exudate, no erythema and lips, buccal mucosa, and tongue normal  NECK: supple, thyroid  normal size, non-tender, without nodularity LYMPH:  no palpable lymphadenopathy in the cervical, axillary or inguinal LUNGS: clear to auscultation and percussion with normal breathing effort.  She does have high-pitched expiratory wheezing in throat/apices of her lungs.Aaron Aas HEART: regular rate & rhythm and no murmurs and no lower extremity edema ABDOMEN:abdomen soft, non-tender and normal bowel sounds Musculoskeletal:no cyanosis of digits and no clubbing  NEURO: alert & oriented x 3 with fluent speech, no focal motor/sensory deficits  LABORATORY DATA:  I have reviewed the data as listed    Component Value Date/Time   NA 140 03/03/2024 0957   NA 143 03/30/2017 1044   K 3.8 03/03/2024 0957   K 3.8 03/30/2017 1044   CL 104 03/03/2024 0957   CL 106 01/18/2013 0909   CO2 30 03/03/2024 0957   CO2 27 03/30/2017 1044   GLUCOSE 99 03/03/2024 0957   GLUCOSE 91 03/30/2017 1044   GLUCOSE 99 01/18/2013 0909   BUN 37 (H) 03/03/2024 0957   BUN 9.7 03/30/2017 1044   CREATININE 1.16 (H) 03/03/2024 0957   CREATININE 0.8 03/30/2017 1044   CALCIUM  9.3 03/03/2024 0957   CALCIUM  9.7 03/30/2017 1044   PROT 8.1 03/03/2024 0957   PROT 8.0 03/30/2017 1044   ALBUMIN 3.8 03/03/2024 0957   ALBUMIN 3.8 01/27/2021 1232   ALBUMIN 3.4 (L) 03/30/2017 1044   AST 15 03/03/2024 0957   AST 17 03/30/2017 1044   ALT 12 03/03/2024 0957   ALT 15 03/30/2017 1044   ALKPHOS 44 03/03/2024 0957   ALKPHOS 60 03/30/2017 1044   BILITOT 0.4 03/03/2024 0957   BILITOT 0.34 03/30/2017 1044   GFRNONAA 52 (L) 03/03/2024 0957   GFRAA >60 07/11/2020 0923    Lab Results  Component  Value Date   SPEP Comment 06/11/2021    Lab Results  Component Value Date   WBC 6.0 03/03/2024   NEUTROABS 3.5 03/03/2024   HGB 10.6 (L) 03/03/2024   HCT 32.3 (L) 03/03/2024   MCV 88.0 03/03/2024   PLT 206 03/03/2024    RADIOGRAPHIC STUDIES: DG Chest 2 View Result Date: 03/03/2024 CLINICAL DATA:  68 year old female with wheezing EXAM: CHEST - 2 VIEW COMPARISON:  02/15/2023 FINDINGS: Cardiomediastinal silhouette unchanged in size and contour. No evidence of central vascular congestion. No interlobular septal thickening. Unchanged right IJ port catheter Similar asymmetric elevation of the left hemidiaphragm. No pneumothorax or pleural effusion. Coarsened interstitial markings, with no confluent airspace disease. No acute displaced fracture. Degenerative changes of the spine. IMPRESSION: Low lung volumes with no definite evidence of acute cardiopulmonary disease Electronically Signed   By: Myrlene Asper D.O.   On: 03/03/2024 16:22   DG Foot Complete Right Result Date: 02/24/2024 Please see detailed radiograph report in office note.  ECHOCARDIOGRAM COMPLETE Result Date: 02/21/2024    ECHOCARDIOGRAM REPORT   Patient Name:   [name] Date of Exam: 02/21/2024 Medical Rec #:  [id number]            Height:       59.0 in Accession #:    [id number]           Weight:       242.0 lb Date of Birth:  [birth year]            BSA:          2.000 m Patient Age:    67 years             BP:           131/61 mmHg Patient Gender: F                    HR:           70 bpm. Exam Location:  Outpatient Procedure: 2D Echo, 3D Echo, Cardiac Doppler, Color Doppler and Strain Analysis            (Both Spectral and Color Flow Doppler were utilized during            procedure). Indications:    Chemo, CHF  History:        Patient has prior history of Echocardiogram examinations, most                 recent 08/24/2023. CHF and Cardiomyopathy,                 Signs/Symptoms:Shortness of Breath; Risk Factors:Hypertension,                  Diabetes, Dyslipidemia and Sleep Apnea.  Sonographer:    Aldon Ambrosia Referring Phys: [phone] DALTON S MCLEAN IMPRESSIONS  1. Left ventricular ejection fraction, by estimation, is 60 to 65%. Left ventricular ejection fraction by 3D volume is 60 %. The left ventricle has normal function. The left ventricle has no regional wall motion abnormalities. Left ventricular diastolic  parameters were normal. The average left ventricular global longitudinal strain is -18.7 %. The global longitudinal strain is normal.  2. Right ventricular systolic function is normal. The right ventricular size is normal.  3. The mitral valve is normal in structure. Mild mitral valve regurgitation. No evidence of mitral stenosis.  4. The aortic valve was not well visualized. Aortic valve regurgitation is not visualized. No aortic stenosis is present. Comparison(s): A prior study was performed on 03/16/2023. Grade I Diastolic dysfunction is  now normal. FINDINGS  Left Ventricle: Left ventricular ejection fraction, by estimation, is 60 to 65%. Left ventricular ejection fraction by 3D volume is 60 %. The left ventricle has normal function. The left ventricle has no regional wall motion abnormalities. The average left ventricular global longitudinal strain is -18.7 %. Strain was performed and the global longitudinal strain is normal. The left ventricular internal cavity size was normal in size. There is no left ventricular hypertrophy. Left ventricular diastolic parameters were normal. Right Ventricle: The right ventricular size is normal. No increase in right ventricular wall thickness. Right ventricular systolic function is normal. Left Atrium: Left atrial size was normal in size. Right Atrium: Right atrial size was normal in size. Pericardium: There is no evidence of pericardial effusion. Mitral Valve: The mitral valve is normal in structure. Mild mitral valve regurgitation. No evidence of mitral valve stenosis. MV peak gradient, 2.3  mmHg. The mean mitral valve gradient is 1.0 mmHg. Tricuspid Valve: The tricuspid valve is normal in structure. Tricuspid valve regurgitation is not demonstrated. No evidence of tricuspid stenosis. Aortic Valve: The aortic valve was not well visualized. Aortic valve regurgitation is not visualized. No aortic stenosis is present. Aortic valve mean gradient measures 3.0 mmHg. Aortic valve peak gradient measures 6.2 mmHg. Aortic valve area, by VTI measures 2.41 cm. Pulmonic Valve: The pulmonic valve was not well visualized. Pulmonic valve regurgitation is not visualized. No evidence of pulmonic stenosis. Aorta: The aortic root and ascending aorta are structurally normal, with no evidence of dilitation. IAS/Shunts: The atrial septum is grossly normal. Additional Comments: 3D was performed not requiring image post processing on an independent workstation and was normal.  LEFT VENTRICLE PLAX 2D LVIDd:         4.40 cm         Diastology LVIDs:         3.00 cm         LV e' medial:    8.81 cm/s LV PW:         0.90 cm         LV E/e' medial:  7.9 LV IVS:        0.80 cm         LV e' lateral:   9.03 cm/s LVOT diam:     1.80 cm         LV E/e' lateral: 7.8 LV SV:         59 LV SV Index:   30              2D Longitudinal LVOT Area:     2.54 cm        Strain                                2D Strain GLS   -18.7 %                                Avg: LV Volumes (MOD) LV vol d, MOD    118.0 ml      3D Volume EF A2C:                           LV 3D EF:    Left LV vol d, MOD    98.8 ml  ventricul A4C:                                        ar LV vol s, MOD    38.6 ml                    ejection A2C:                                        fraction LV vol s, MOD    40.7 ml                    by 3D A4C:                                        volume is LV SV MOD A2C:   79.4 ml                    60 %. LV SV MOD A4C:   98.8 ml LV SV MOD BP:    68.9 ml                                3D Volume EF:                                 3D EF:        60 % RIGHT VENTRICLE             IVC RV Basal diam:  3.40 cm     IVC diam: 1.50 cm RV Mid diam:    2.00 cm RV S prime:     12.60 cm/s TAPSE (M-mode): 3.0 cm LEFT ATRIUM             Index        RIGHT ATRIUM           Index LA diam:        3.00 cm 1.50 cm/m   RA Area:     12.10 cm LA Vol (A2C):   58.5 ml 29.25 ml/m  RA Volume:   25.80 ml  12.90 ml/m LA Vol (A4C):   43.6 ml 21.80 ml/m LA Biplane Vol: 54.4 ml 27.20 ml/m  AORTIC VALVE                    PULMONIC VALVE AV Area (Vmax):    1.89 cm     PV Vmax:       0.83 m/s AV Area (Vmean):   2.04 cm     PV Peak grad:  2.8 mmHg AV Area (VTI):     2.41 cm AV Vmax:           125.00 cm/s AV Vmean:          73.500 cm/s AV VTI:            0.245 m AV Peak Grad:      6.2 mmHg AV Mean Grad:      3.0 mmHg LVOT Vmax:         92.90 cm/s LVOT Vmean:  59.000 cm/s LVOT VTI:          0.232 m LVOT/AV VTI ratio: 0.95  AORTA Ao Root diam: 2.20 cm Ao Asc diam:  3.10 cm MITRAL VALVE MV Area (PHT): 5.13 cm    SHUNTS MV Area VTI:   2.75 cm    Systemic VTI:  0.23 m MV Peak grad:  2.3 mmHg    Systemic Diam: 1.80 cm MV Mean grad:  1.0 mmHg MV Vmax:       0.75 m/s MV Vmean:      44.7 cm/s MV Decel Time: 148 msec MV E velocity: 70.00 cm/s MV A velocity: 81.00 cm/s MV E/A ratio:  0.86 Sunit Tolia Electronically signed by Olinda Bertrand Signature Date/Time: 02/21/2024/4:59:19 PM    Final

## 2024-03-02 NOTE — Progress Notes (Signed)
 Error

## 2024-03-03 ENCOUNTER — Ambulatory Visit (HOSPITAL_COMMUNITY)
Admission: RE | Admit: 2024-03-03 | Discharge: 2024-03-03 | Disposition: A | Discharge status: Home / Self Care | Source: Ambulatory Visit | Attending: Nurse Practitioner | Admitting: Nurse Practitioner

## 2024-03-03 ENCOUNTER — Ambulatory Visit

## 2024-03-03 ENCOUNTER — Inpatient Hospital Stay

## 2024-03-03 ENCOUNTER — Inpatient Hospital Stay (HOSPITAL_BASED_OUTPATIENT_CLINIC_OR_DEPARTMENT_OTHER): Admitting: Nurse Practitioner

## 2024-03-03 ENCOUNTER — Encounter: Payer: Self-pay | Admitting: Hematology and Oncology

## 2024-03-03 ENCOUNTER — Inpatient Hospital Stay: Attending: Hematology and Oncology

## 2024-03-03 ENCOUNTER — Ambulatory Visit: Admitting: Adult Health

## 2024-03-03 ENCOUNTER — Other Ambulatory Visit

## 2024-03-03 VITALS — BP 122/70 | HR 66 | Temp 98.1°F | Resp 21 | Ht 59.0 in | Wt 243.3 lb

## 2024-03-03 DIAGNOSIS — Z1722 Progesterone receptor negative status: Secondary | ICD-10-CM | POA: Diagnosis not present

## 2024-03-03 DIAGNOSIS — Z90722 Acquired absence of ovaries, bilateral: Secondary | ICD-10-CM | POA: Insufficient documentation

## 2024-03-03 DIAGNOSIS — R062 Wheezing: Secondary | ICD-10-CM | POA: Insufficient documentation

## 2024-03-03 DIAGNOSIS — C50812 Malignant neoplasm of overlapping sites of left female breast: Secondary | ICD-10-CM | POA: Insufficient documentation

## 2024-03-03 DIAGNOSIS — Z923 Personal history of irradiation: Secondary | ICD-10-CM | POA: Diagnosis not present

## 2024-03-03 DIAGNOSIS — Z171 Estrogen receptor negative status [ER-]: Secondary | ICD-10-CM | POA: Insufficient documentation

## 2024-03-03 DIAGNOSIS — Z5112 Encounter for antineoplastic immunotherapy: Secondary | ICD-10-CM | POA: Insufficient documentation

## 2024-03-03 DIAGNOSIS — Z90710 Acquired absence of both cervix and uterus: Secondary | ICD-10-CM | POA: Diagnosis not present

## 2024-03-03 DIAGNOSIS — C50412 Malignant neoplasm of upper-outer quadrant of left female breast: Secondary | ICD-10-CM

## 2024-03-03 DIAGNOSIS — C773 Secondary and unspecified malignant neoplasm of axilla and upper limb lymph nodes: Secondary | ICD-10-CM | POA: Diagnosis present

## 2024-03-03 DIAGNOSIS — Z1731 Human epidermal growth factor receptor 2 positive status: Secondary | ICD-10-CM | POA: Diagnosis not present

## 2024-03-03 DIAGNOSIS — C50112 Malignant neoplasm of central portion of left female breast: Secondary | ICD-10-CM

## 2024-03-03 DIAGNOSIS — Z95828 Presence of other vascular implants and grafts: Secondary | ICD-10-CM

## 2024-03-03 LAB — CBC WITH DIFFERENTIAL (CANCER CENTER ONLY)
Abs Immature Granulocytes: 0.02 10*3/uL (ref 0.00–0.07)
Basophils Absolute: 0.1 10*3/uL (ref 0.0–0.1)
Basophils Relative: 1 %
Eosinophils Absolute: 0.2 10*3/uL (ref 0.0–0.5)
Eosinophils Relative: 3 %
HCT: 32.3 % — ABNORMAL LOW (ref 36.0–46.0)
Hemoglobin: 10.6 g/dL — ABNORMAL LOW (ref 12.0–15.0)
Immature Granulocytes: 0 %
Lymphocytes Relative: 26 %
Lymphs Abs: 1.5 10*3/uL (ref 0.7–4.0)
MCH: 28.9 pg (ref 26.0–34.0)
MCHC: 32.8 g/dL (ref 30.0–36.0)
MCV: 88 fL (ref 80.0–100.0)
Monocytes Absolute: 0.7 10*3/uL (ref 0.1–1.0)
Monocytes Relative: 12 %
Neutro Abs: 3.5 10*3/uL (ref 1.7–7.7)
Neutrophils Relative %: 58 %
Platelet Count: 206 10*3/uL (ref 150–400)
RBC: 3.67 MIL/uL — ABNORMAL LOW (ref 3.87–5.11)
RDW: 13.8 % (ref 11.5–15.5)
WBC Count: 6 10*3/uL (ref 4.0–10.5)
nRBC: 0 % (ref 0.0–0.2)

## 2024-03-03 LAB — CMP (CANCER CENTER ONLY)
ALT: 12 U/L (ref 0–44)
AST: 15 U/L (ref 15–41)
Albumin: 3.8 g/dL (ref 3.5–5.0)
Alkaline Phosphatase: 44 U/L (ref 38–126)
Anion gap: 6 (ref 5–15)
BUN: 37 mg/dL — ABNORMAL HIGH (ref 8–23)
CO2: 30 mmol/L (ref 22–32)
Calcium: 9.3 mg/dL (ref 8.9–10.3)
Chloride: 104 mmol/L (ref 98–111)
Creatinine: 1.16 mg/dL — ABNORMAL HIGH (ref 0.44–1.00)
GFR, Estimated: 52 mL/min — ABNORMAL LOW (ref >60)
Glucose, Bld: 99 mg/dL (ref 70–99)
Potassium: 3.8 mmol/L (ref 3.5–5.1)
Sodium: 140 mmol/L (ref 135–145)
Total Bilirubin: 0.4 mg/dL (ref 0.0–1.2)
Total Protein: 8.1 g/dL (ref 6.5–8.1)

## 2024-03-03 MED ORDER — ACETAMINOPHEN 325 MG PO TABS
650.0000 mg | ORAL_TABLET | Freq: Once | ORAL | Status: AC
  Administered 2024-03-03: 650 mg via ORAL
  Filled 2024-03-03: qty 2

## 2024-03-03 MED ORDER — SODIUM CHLORIDE 0.9% FLUSH
10.0000 mL | Freq: Once | INTRAVENOUS | Status: AC
  Administered 2024-03-03: 10 mL

## 2024-03-03 MED ORDER — TRASTUZUMAB-ANNS CHEMO 150 MG IV SOLR
6.0000 mg/kg | Freq: Once | INTRAVENOUS | Status: AC
  Administered 2024-03-03: 600 mg via INTRAVENOUS
  Filled 2024-03-03: qty 28.57

## 2024-03-03 MED ORDER — SODIUM CHLORIDE 0.9 % IV SOLN: Freq: Once | INTRAVENOUS | Status: AC

## 2024-03-03 MED ORDER — DIPHENHYDRAMINE HCL 25 MG PO CAPS
25.0000 mg | ORAL_CAPSULE | Freq: Once | ORAL | Status: AC
Start: 2024-03-03 — End: 2024-03-03
  Administered 2024-03-03: 25 mg via ORAL
  Filled 2024-03-03: qty 1

## 2024-03-03 MED ORDER — SODIUM CHLORIDE 0.9% FLUSH
10.0000 mL | INTRAVENOUS | Status: DC | PRN
Start: 2024-03-03 — End: 2024-03-03
  Administered 2024-03-03: 10 mL

## 2024-03-03 MED ORDER — HEPARIN SOD (PORK) LOCK FLUSH 100 UNIT/ML IV SOLN
500.0000 [IU] | Freq: Once | INTRAVENOUS | Status: AC | PRN
  Administered 2024-03-03: 500 [IU]

## 2024-03-03 NOTE — Patient Instructions (Signed)
 [facility] - A DEPT OF [facility]  Discharge Instructions: Thank you for choosing [facility] to provide your oncology and hematology care.   If you have a lab appointment with the Cancer Center, please go directly to the Cancer Center and check in at the registration area.   Wear comfortable clothing and clothing appropriate for easy access to any Portacath or PICC line.   We strive to give you quality time with your provider. You may need to reschedule your appointment if you arrive late (15 or more minutes).  Arriving late affects you and other patients whose appointments are after yours.  Also, if you miss three or more appointments without notifying the office, you may be dismissed from the clinic at the provider's discretion.      For prescription refill requests, have your pharmacy contact our office and allow 72 hours for refills to be completed.    Today you received the following chemotherapy and/or immunotherapy agents: trastuzumab-anns Ernestine Mcmurray)     To help prevent nausea and vomiting after your treatment, we encourage you to take your nausea medication as directed.  BELOW ARE SYMPTOMS THAT SHOULD BE REPORTED IMMEDIATELY: *FEVER GREATER THAN 100.4 F (38 C) OR HIGHER *CHILLS OR SWEATING *NAUSEA AND VOMITING THAT IS NOT CONTROLLED WITH YOUR NAUSEA MEDICATION *UNUSUAL SHORTNESS OF BREATH *UNUSUAL BRUISING OR BLEEDING *URINARY PROBLEMS (pain or burning when urinating, or frequent urination) *BOWEL PROBLEMS (unusual diarrhea, constipation, pain near the anus) TENDERNESS IN MOUTH AND THROAT WITH OR WITHOUT PRESENCE OF ULCERS (sore throat, sores in mouth, or a toothache) UNUSUAL RASH, SWELLING OR PAIN  UNUSUAL VAGINAL DISCHARGE OR ITCHING   Items with * indicate a potential emergency and should be followed up as soon as possible or go to the Emergency Department if any problems should occur.  Please show the CHEMOTHERAPY ALERT CARD  or IMMUNOTHERAPY ALERT CARD at check-in to the Emergency Department and triage nurse.  Should you have questions after your visit or need to cancel or reschedule your appointment, please contact [facility] - A DE[facility]  Dept: [phone]  and follow the prompts.  Office hours are 8:00 a.m. to 4:30 p.m. Monday - Friday. Please note that voicemails left after 4:00 p.m. may not be returned until the following business day.  We are closed weekends and major holidays. You have access to a nurse at all times for urgent questions. Please call the main number to the clinic Dept: [phone] and follow the prompts.   For any non-urgent questions, you may also contact your provider using MyChart. We now offer e-Visits for anyone 61 and older to request care online for non-urgent symptoms. For details visit [email or web address].   Also download the MyChart app! Go to the app store, search "MyChart", open the app, select [facility], and log in with your MyChart username and password.

## 2024-03-07 ENCOUNTER — Ambulatory Visit: Payer: Self-pay | Admitting: Nurse Practitioner

## 2024-03-13 ENCOUNTER — Encounter: Payer: Self-pay | Admitting: Nurse Practitioner

## 2024-03-13 ENCOUNTER — Encounter: Payer: Self-pay | Admitting: Hematology and Oncology

## 2024-03-13 NOTE — Assessment & Plan Note (Addendum)
 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 She is status post radiation completed August of 2012, On tamoxifen  from September of 2012 to April 2014. The plan had been to initiate anastrozole  in April 2014, but the patient had a menstrual cycle in May 2014, and resumed tamoxifen . She did discontinue tamoxifen  on her own initiative June 2015 because of "aches and pains".  She resumed tamoxifen  December 2015. Tamoxifen  was later discontinued February 2016 at patient's discretion. She continues to receive Herceptin  infusions every 3 weeks for maintenance therapy.  - 02/21/2024-reviewed echocardiogram showing normal LVEF and normal left heart strain. -03/03/2024 -proceed with scheduled Herceptin  infusion.  Continue with Herceptin  infusions every 3 weeks as scheduled.

## 2024-03-14 ENCOUNTER — Ambulatory Visit: Payer: Self-pay

## 2024-03-14 ENCOUNTER — Other Ambulatory Visit: Payer: Self-pay

## 2024-03-14 NOTE — Telephone Encounter (Signed)
  Chief Complaint: upper chest congestion/wheezing Symptoms: reports "wheezing" around throat. Can be heard as "rattling" Frequency: three weeks Pertinent Negatives: Patient denies fever, chest pain Disposition: [] ED /[] Urgent Care (no appt availability in office) / [x] Appointment(In office/virtual)/ []  [place]/ [] Home Care/ [] Refused Recommended Disposition /[] [facility]/ []  Follow-up with PCP Additional Notes: patient calling with reports of continued upper chest congestion-"wheezing" per patient report. Patient states "wheezing" is around her throat and other individuals can hear rattling. Patient states this has been going on for three weeks. Patient reported this to oncology on 5/16. Patient was sent for x-ray. Patient reports no  new symptoms. Wanting to be seen by PCP. Patient scheduled to be seen tomorrow, 03/15/2024 at by PCP at 10:20 AM. Patient verbalized understanding of plan and all questions answered.    Reason for Disposition  [1] MILD difficulty breathing (e.g., minimal/no SOB at rest, SOB with walking, pulse <100) AND [2] NEW-onset or WORSE than normal  Answer Assessment - Initial Assessment Questions 1. RESPIRATORY STATUS: "Describe your breathing?" (e.g., wheezing, shortness of breath, unable to speak, severe coughing)      Upper chest congestion-wheezing per patient 2. ONSET: "When did this breathing problem begin?"      Three weeks ago 3. PATTERN "Does the difficult breathing come and go, or has it been constant since it started?"      Comes and goes 4. SEVERITY: "How bad is your breathing?" (e.g., mild, moderate, severe)    - MILD: No SOB at rest, mild SOB with walking, speaks normally in sentences, can lie down, no retractions, pulse < 100.    - MODERATE: SOB at rest, SOB with minimal exertion and prefers to sit, cannot lie down flat, speaks in phrases, mild retractions, audible wheezing, pulse 100-120.    - SEVERE: Very SOB at rest, speaks in  single words, struggling to breathe, sitting hunched forward, retractions, pulse > 120      Mild 5. RECURRENT SYMPTOM: "Have you had difficulty breathing before?" If Yes, ask: "When was the last time?" and "What happened that time?"      no 6. CARDIAC HISTORY: "Do you have any history of heart disease?" (e.g., heart attack, angina, bypass surgery, angioplasty)      Hypertension 7. LUNG HISTORY: "Do you have any history of lung disease?"  (e.g., pulmonary embolus, asthma, emphysema)     no 8. CAUSE: "What do you think is causing the breathing problem?"      Unsure-already had a chest x-ray completed by cancer center 9. OTHER SYMPTOMS: "Do you have any other symptoms? (e.g., dizziness, runny nose, cough, chest pain, fever)     none 10. O2 SATURATION MONITOR:  "Do you use an oxygen saturation monitor (pulse oximeter) at home?" If Yes, ask: "What is your reading (oxygen level) today?" "What is your usual oxygen saturation reading?" (e.g., 95%)       N/A 12. TRAVEL: "Have you traveled out of the country in the last month?" (e.g., travel history, exposures)       no  Protocols used: Breathing Difficulty-A-AH

## 2024-03-14 NOTE — Telephone Encounter (Signed)
 Phone system connection failed during transfer from agent to RN. This RN called back patient but was unable to speak with her. Voicemail left to call back to office to speak with any nurse. Will place in call backs.   Copied from CRM [id number]. Topic: Clinical - Red Word Triage >> Mar 14, 2024  9:27 AM Kita Perish H wrote: [facility] that prompted transfer to Nurse Triage: Rattling and wheezing in upper chest area around throat area

## 2024-03-15 ENCOUNTER — Ambulatory Visit (INDEPENDENT_AMBULATORY_CARE_PROVIDER_SITE_OTHER)

## 2024-03-15 ENCOUNTER — Ambulatory Visit: Admitting: Internal Medicine

## 2024-03-15 ENCOUNTER — Encounter: Payer: Self-pay | Admitting: Internal Medicine

## 2024-03-15 ENCOUNTER — Encounter: Payer: Self-pay | Admitting: Hematology and Oncology

## 2024-03-15 ENCOUNTER — Ambulatory Visit (INDEPENDENT_AMBULATORY_CARE_PROVIDER_SITE_OTHER): Admitting: Internal Medicine

## 2024-03-15 VITALS — BP 132/80 | HR 62 | Temp 98.0°F | Ht 59.0 in | Wt 241.0 lb

## 2024-03-15 DIAGNOSIS — R051 Acute cough: Secondary | ICD-10-CM | POA: Diagnosis not present

## 2024-03-15 DIAGNOSIS — E119 Type 2 diabetes mellitus without complications: Secondary | ICD-10-CM | POA: Diagnosis not present

## 2024-03-15 DIAGNOSIS — Z7985 Long-term (current) use of injectable non-insulin antidiabetic drugs: Secondary | ICD-10-CM

## 2024-03-15 DIAGNOSIS — E559 Vitamin D deficiency, unspecified: Secondary | ICD-10-CM

## 2024-03-15 DIAGNOSIS — R0600 Dyspnea, unspecified: Secondary | ICD-10-CM | POA: Diagnosis not present

## 2024-03-15 MED ORDER — AZITHROMYCIN 250 MG PO TABS: ORAL_TABLET | ORAL | 1 refills | Status: AC

## 2024-03-15 MED ORDER — ALBUTEROL SULFATE HFA 108 (90 BASE) MCG/ACT IN AERS: 2.0000 | INHALATION_SPRAY | Freq: Four times a day (QID) | RESPIRATORY_TRACT | 0 refills | Status: DC | PRN

## 2024-03-15 NOTE — Assessment & Plan Note (Signed)
 Lab Results  Component Value Date   HGBA1C 6.2 11/09/2023   Stable, pt to continue current medical treatment mounjaro  5 mg weekly

## 2024-03-15 NOTE — Patient Instructions (Signed)
 Please take all new medication as prescribed - the antibiotic, and inhaler as needed  Please continue all other medications as before, and refills have been done if requested.  Please have the pharmacy call with any other refills you may need.  Please keep your appointments with your specialists as you may have planned  You will be contacted regarding the referral for: CT chest, and Pulmonary  Please go to the XRAY Department in the first floor for the x-ray testing  You will be contacted by phone if any changes need to be made immediately.  Otherwise, you will receive a letter about your results with an explanation, but please check with MyChart first.

## 2024-03-15 NOTE — Telephone Encounter (Addendum)
 Called the patient to relay the message below as per Rande Bushy NP, patient stated she is still having the wheezing so she is seeing her PCP tomorrow for a follow up. Patient had no further questions at this time.   ----- Message from Sharyon Deis sent at 03/07/2024 11:40 AM EDT ----- Hi. Please let the patient know that her chest x-ray showing no evidence of acute cardiopulmonary disease. Thank you.  Pattie Borders, NP ----- Message ----- From: Interface, Rad Results In Sent: 03/03/2024   4:24 PM EDT To: Sharyon Deis, NP

## 2024-03-15 NOTE — Assessment & Plan Note (Signed)
 Also for albuterol hfa prn

## 2024-03-15 NOTE — Assessment & Plan Note (Addendum)
 Etiology unclear, possibly bronchitis or CAP or even bronchiectasis or other pulm issue involved with worsening metastasis; today for zpack x 1, CXR, CTA chest, labs as ordered including cbc, and refer pulmonary
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
## 2024-03-15 NOTE — Assessment & Plan Note (Signed)
 Last vitamin D Lab Results  Component Value Date   VD25OH 37.66 07/10/2022   Low, to start oral replacement

## 2024-03-16 ENCOUNTER — Encounter: Payer: Self-pay | Admitting: Podiatry

## 2024-03-16 ENCOUNTER — Ambulatory Visit (INDEPENDENT_AMBULATORY_CARE_PROVIDER_SITE_OTHER): Admitting: Podiatry

## 2024-03-16 DIAGNOSIS — M205X2 Other deformities of toe(s) (acquired), left foot: Secondary | ICD-10-CM

## 2024-03-16 DIAGNOSIS — M19071 Primary osteoarthritis, right ankle and foot: Secondary | ICD-10-CM

## 2024-03-16 DIAGNOSIS — M76821 Posterior tibial tendinitis, right leg: Secondary | ICD-10-CM

## 2024-03-16 DIAGNOSIS — B351 Tinea unguium: Secondary | ICD-10-CM | POA: Diagnosis not present

## 2024-03-16 DIAGNOSIS — M722 Plantar fascial fibromatosis: Secondary | ICD-10-CM

## 2024-03-16 MED ORDER — TRIAMCINOLONE ACETONIDE 10 MG/ML IJ SUSP
10.0000 mg | Freq: Once | INTRAMUSCULAR | Status: AC
  Administered 2024-03-16: 10 mg
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
## 2024-03-18 ENCOUNTER — Encounter: Payer: Self-pay | Admitting: Podiatry

## 2024-03-18 NOTE — Patient Instructions (Signed)

## 2024-03-20 ENCOUNTER — Telehealth: Payer: Self-pay | Admitting: Internal Medicine

## 2024-03-20 NOTE — Telephone Encounter (Signed)
 Oh no, sorry we still dont have results from the xray, but will give results as soon as we have them.   thanks

## 2024-03-20 NOTE — Telephone Encounter (Unsigned)
 Copied from CRM [id number]. Topic: Clinical - Lab/Test Results >> Mar 20, 2024  2:55 PM [place] M wrote: Reason for CRM: Patient calling to get results of imaging done 5/28

## 2024-03-22 NOTE — Telephone Encounter (Signed)
 Copied from CRM [id number]. Topic: Clinical - Lab/Test Results >> Mar 22, 2024 11:51 AM Clyde Darling P wrote: Reason for CRM: Pt want xray results 05/28 - stated she has not been contacted as of yet

## 2024-03-23 ENCOUNTER — Encounter (HOSPITAL_COMMUNITY): Payer: Self-pay | Admitting: Cardiology

## 2024-03-23 ENCOUNTER — Other Ambulatory Visit (HOSPITAL_COMMUNITY): Payer: Self-pay | Admitting: Cardiology

## 2024-03-23 NOTE — Telephone Encounter (Signed)
 Results are still not back, Pt will be contacted once results are back.

## 2024-03-24 ENCOUNTER — Ambulatory Visit: Admitting: Gastroenterology

## 2024-03-24 ENCOUNTER — Ambulatory Visit: Payer: Self-pay | Admitting: Internal Medicine

## 2024-03-27 ENCOUNTER — Encounter: Payer: Self-pay | Admitting: Neurology

## 2024-03-27 ENCOUNTER — Telehealth: Payer: Self-pay

## 2024-03-27 ENCOUNTER — Ambulatory Visit (HOSPITAL_COMMUNITY)
Admission: RE | Admit: 2024-03-27 | Discharge: 2024-03-27 | Disposition: A | Discharge status: Home / Self Care | Payer: 59 | Source: Ambulatory Visit | Attending: Family Medicine

## 2024-03-27 ENCOUNTER — Encounter (HOSPITAL_COMMUNITY): Payer: Self-pay

## 2024-03-27 ENCOUNTER — Ambulatory Visit (INDEPENDENT_AMBULATORY_CARE_PROVIDER_SITE_OTHER): Admitting: Neurology

## 2024-03-27 VITALS — BP 140/84 | HR 66 | Ht 59.0 in | Wt 242.0 lb

## 2024-03-27 VITALS — BP 126/74 | HR 67 | Wt 241.6 lb

## 2024-03-27 DIAGNOSIS — E785 Hyperlipidemia, unspecified: Secondary | ICD-10-CM | POA: Diagnosis not present

## 2024-03-27 DIAGNOSIS — G959 Disease of spinal cord, unspecified: Secondary | ICD-10-CM

## 2024-03-27 DIAGNOSIS — E1142 Type 2 diabetes mellitus with diabetic polyneuropathy: Secondary | ICD-10-CM | POA: Diagnosis not present

## 2024-03-27 DIAGNOSIS — I429 Cardiomyopathy, unspecified: Secondary | ICD-10-CM | POA: Insufficient documentation

## 2024-03-27 DIAGNOSIS — I10 Essential (primary) hypertension: Secondary | ICD-10-CM

## 2024-03-27 DIAGNOSIS — I5032 Chronic diastolic (congestive) heart failure: Secondary | ICD-10-CM | POA: Insufficient documentation

## 2024-03-27 DIAGNOSIS — Z7985 Long-term (current) use of injectable non-insulin antidiabetic drugs: Secondary | ICD-10-CM | POA: Insufficient documentation

## 2024-03-27 DIAGNOSIS — G8929 Other chronic pain: Secondary | ICD-10-CM | POA: Insufficient documentation

## 2024-03-27 DIAGNOSIS — Z86718 Personal history of other venous thrombosis and embolism: Secondary | ICD-10-CM | POA: Insufficient documentation

## 2024-03-27 DIAGNOSIS — Z853 Personal history of malignant neoplasm of breast: Secondary | ICD-10-CM | POA: Diagnosis not present

## 2024-03-27 DIAGNOSIS — G629 Polyneuropathy, unspecified: Secondary | ICD-10-CM

## 2024-03-27 DIAGNOSIS — R2681 Unsteadiness on feet: Secondary | ICD-10-CM | POA: Diagnosis not present

## 2024-03-27 DIAGNOSIS — Z7901 Long term (current) use of anticoagulants: Secondary | ICD-10-CM | POA: Diagnosis not present

## 2024-03-27 DIAGNOSIS — Z6841 Body Mass Index (BMI) 40.0 and over, adult: Secondary | ICD-10-CM | POA: Diagnosis not present

## 2024-03-27 DIAGNOSIS — E669 Obesity, unspecified: Secondary | ICD-10-CM | POA: Insufficient documentation

## 2024-03-27 DIAGNOSIS — M62838 Other muscle spasm: Secondary | ICD-10-CM

## 2024-03-27 DIAGNOSIS — Z79899 Other long term (current) drug therapy: Secondary | ICD-10-CM | POA: Insufficient documentation

## 2024-03-27 DIAGNOSIS — Z8542 Personal history of malignant neoplasm of other parts of uterus: Secondary | ICD-10-CM | POA: Insufficient documentation

## 2024-03-27 DIAGNOSIS — I110 Hypertensive heart disease with heart failure: Secondary | ICD-10-CM | POA: Diagnosis not present

## 2024-03-27 MED ORDER — CYCLOBENZAPRINE HCL 5 MG PO TABS: 5.0000 mg | ORAL_TABLET | Freq: Every evening | ORAL | 1 refills | Status: AC | PRN

## 2024-03-27 NOTE — Progress Notes (Signed)
 PCP: Roslyn Coombe, MD Oncology: Dr. Arno Bibles HF Cardiology: Dr. [name]  68 y.o. with history of DM, HTN, hyperlipidemia, endometrial cancer, and breast cancer was referred by Dr. [name] Copes for cardio-oncology evaluation.  Initial breast cancer diagnosis was in 2012.  She had left breast lumpectomy and radiation.  Recurrence in 7/20, ER+/PR-/HER2+.  She had chemotherapy with trastuzumab /pertuzumab /carboplatin /docetaxol x 6 starting 2/21.  With fall in EF in 4/21, Herceptin  was held.  Echo in 4/21 showed EF 45-50%, Coreg  was started. Echo in 6/21 showed EF back up to 55-60%, and Herceptin  was restarted.  In 6/21, she had lumpectomy.  Echo in 7/21 showed EF stable at 55-60%.    Echo in 9/21 showed EF 55-60% with normal RV, strain images did not track well.  Coronary CTA was done in 9/21, showing coronary artery calcium  score 0 and no significant CAD.    PET in 9/21 with new lymphadenopathy.  She was started on TDM-1/Kadcyla  but later changed back to Herceptin  which will continue for long-term.   In 12/21, she had a cervical discectomy and fusion.   Echo in 2/22 showed EF 55% with GLS -10.7% but poor endocardial tracing. Normal RV.  Echo in 4/22 showed EF 50-55%, mild LVH, normal RV, GLS -14.3% but poor endocardial tracing. Echo in 7/22 showed EF 55-60%, mild LVH, mild LAE, GLS -20.1%, normal RV, PASP 32 mmHg.  Echo in 10/22 showed EF 55-60%, mild LVH, GLS -20.1%, normal RV. Echo in 5/23 showed EF 55-60% with GLS -22.1%, normal RV.   Echo 11/23 showed EF 55% on my read (reported 50-55%), GLS -19.8%, normal RV.  Echo was done today and reviewed, technically difficult study with inaccurate strain imaging due to poor endocardial tracing, EF 55%, normal RV, normal IVC.   PYP scan in 3/24 was grade 2 with H/CL 1.08 (equivocal).   Follow up 5/24, stable NYHA III. TTR gene testing and cMRI arranged to evaluate for possible cardiac amyloidosis.   cMRI (6/24) showed LVEF 53%, RVEF 52%, no evidence for cardiac  amyloidosis. [facility] gene testing for hATTR was negative. Echo in 11/24 showed EF 55%, mild LVH, normal RV.   Echo 5/25 showed EF 60-65%, normal RV, mild MR  Today she returns for HF follow up. Overall feeling fair. Multiple complaints today. She feels wheezing in upper chest, PCP has Rx'd abx and CXR showed no acute abnormalities. She is fatigued. She has chronic LE neuropathy and back pain that limits her physically. She walks with a cane. She has SOB walking on flat ground. Rare palpitations. Denies abnormal bleeding, CP, dizziness, edema, or PND/Orthopnea. Appetite ok.  She does not weigh at home. Taking all medications. She continues on Herceptin .   ECG (personally reviewed): NSR 60 bpm  Labs (5/22): negative serum and urine immunofixation Labs (6/22): ESR 87, ANA negative, ANCA negative, CK 225, hgb 10.2, LDL 110, K 3.7, creatinine 1.38 Labs (10/22): K 3.7, creatinine 1.28 Labs (5/23): K 3.9, creatinine 0.96 Labs (9/23): LDL 77 Labs (11/23): K 3.4, creatinine 1.10 Labs (12/23): K 3.7, creatinine 0.90 Labs (5/24): K 4, creatinine 0.89 Labs (7/24): K 3.8, creatinine 0.85 Labs (11/24): K 3.3, creatinine 1.12 Labs (5/25): K 3.8, creatinine 1.16  PMH: 1. HTN 2. Diabetes 3. Endometrial carcinoma: 8/17 diagnosis, had hysterectomy and BSO.  4. Hyperlipidemia 5. Breast cancer: Initial diagnosis in 2012.  Had left breast lumpectomy and radiation.  Recurrence in 7/20, ER+/PR-/HER2+.  She had chemotherapy with trastuzumab /pertuzumab /carboplatin /docetaxol x 6 starting 2/21.  With fall in EF in 4/21,  Herceptin  was held but later restarted.  Lumpectomy 6/21.  - Echo (1/21): EF 60-65%, GLS -20%, normal RV.  - Echo (4/21): EF 45-50%, global hypokinesis, GLS -12.6%, mildly decreased RV function.  - Echo (6/21): EF 55-60%, GLS -14.4%, mild LVH, PASP 38, RV normal.  - Echo (7/21): EF 55-60%, mild LVH, GLS -16.9%, RV  Normal - Echo (9/21): EF 55-60%, grade 2 diastolic dysfunction, GLS -11.6% (poor  endocardial tracking), normal RV, normal IVC.  - Echo (2/22): EF 55%, GLS -10.7% (poor endocardial tracking), normal RV.  - Echo (4/22): EF 50-55%, mild LVH, normal RV, GLS -14.3% but poor endocardial tracing. - Echo (7/22): EF 55-60%, mild LVH, mild LAE, GLS -20.1%, normal RV, PASP 32 mmHg.  - Echo (10/22): EF 55-60%, mild LVH, GLS -20.1%, normal RV.  - Echo (5/23): EF 55-60% with GLS -22.1%, normal RV.  - Echo (11/23): EF 55% on my read (reported 50-55%), GLS -19.8%, normal RV. - Echo (5/24): technically difficult study with inaccurate strain imaging due to poor endocardial tracing, EF 55%, normal RV, normal IVC.  - PYP scan (3/24): grade 2 with H/CL 1.08 (equivocal).  - cMRI (6/24) showed LVEF 53%, RVEF 52%, no evidence for cardiac amyloidosis - [facility] gene testing negative for hATTR. - Echo (11/24): EF 55%, mild LVH, normal RV.  - Echo 5/25: EF 60-65%, normal RV, mild MR 6. Coronary calcium  score scan (8/21): CAC 0 Agatston units.  - Coronary CTA (9/21): CAC 0 Agatston units.  No significant CAD.  7. DVT: Left leg, 8/21.  8. Cervical discectomy and fusion in 12/21.  9. Peripheral neuropathy 10. Carpal tunnel syndrome  Social History   Socioeconomic History   Marital status: Divorced    Spouse name: Not on file   Number of children: 0   Years of education: Not on file   Highest education level: Not on file  Occupational History   Occupation: Child psychotherapist    Employer: [employer]    Comment: retired  Tobacco Use   Smoking status: Never   Smokeless tobacco: Never  Vaping Use   Vaping status: Never Used  Substance and Sexual Activity   Alcohol use: Not Currently    Comment: occasional on holidays   Drug use: Never   Sexual activity: Not Currently    Birth control/protection: Post-menopausal, Surgical  Other Topics Concern   Not on file  Social History Narrative   1 son deceased with homicide   Patient has been divorced twice   Right handed   Drinks caffeine    One story    Social Drivers of Health   Financial Resource Strain: Low Risk  (06/15/2023)   Overall Financial Resource Strain (CARDIA)    Difficulty of Paying Living Expenses: Not hard at all  Food Insecurity: No Food Insecurity (06/15/2023)   Hunger Vital Sign    Worried About Running Out of Food in the Last Year: Never true    Ran Out of Food in the Last Year: Never true  Transportation Needs: No Transportation Needs (06/15/2023)   PRAPARE - Administrator, Civil Service (Medical): No    Lack of Transportation (Non-Medical): No  Physical Activity: Inactive (06/15/2023)   Exercise Vital Sign    Days of Exercise per Week: 0 days    Minutes of Exercise per Session: 0 min  Stress: No Stress Concern Present (06/15/2023)   [employer] of Occupational Health - Occupational Stress Questionnaire    Feeling of Stress : Not at all  Social Connections: Moderately Integrated (06/15/2023)   Social Connection and Isolation Panel [NHANES]    Frequency of Communication with Friends and Family: More than three times a week    Frequency of Social Gatherings with Friends and Family: More than three times a week    Attends Religious Services: More than 4 times per year    Active Member of [employer] or Organizations: Yes    Attends Engineer, structural: More than 4 times per year    Marital Status: Divorced  Intimate Partner Violence: Not At Risk (06/15/2023)   Humiliation, Afraid, Rape, and Kick questionnaire    Fear of Current or Ex-Partner: No    Emotionally Abused: No    Physically Abused: No    Sexually Abused: No   Family History  Problem Relation Age of Onset   Diabetes Mother    Hypertension Mother    Ovarian cancer Mother    Heart disease Father        COPD   Alcohol abuse Father        ETOH dependence   Breast cancer Maternal Aunt        dx in her 20s   Lung cancer Maternal Uncle    Breast cancer Paternal Aunt    Cancer Maternal Grandmother        salivary  gland cancer   Colon cancer Neg Hx    Colon polyps Neg Hx    Esophageal cancer Neg Hx    Stomach cancer Neg Hx    Rectal cancer Neg Hx    ROS: All systems reviewed and negative except as per HPI.   Current Outpatient Medications  Medication Sig Dispense Refill   acetaminophen  (TYLENOL ) 500 MG tablet Take 500 mg by mouth every 6 (six) hours as needed for moderate pain (pain score 4-6).     albuterol  (VENTOLIN  HFA) 108 (90 Base) MCG/ACT inhaler Inhale 2 puffs into the lungs every 6 (six) hours as needed for wheezing or shortness of breath. 8 g 0   Ascorbic Acid  (VITAMIN C PO) Take 1 tablet by mouth daily.     carvedilol  (COREG ) 6.25 MG tablet TAKE 1 TABLET BY MOUTH TWICE A DAY 60 tablet 11   Cholecalciferol (VITAMIN D3 PO) Take 1 capsule by mouth daily.     Cyanocobalamin  (B-12 PO) Take 1 capsule by mouth daily.     cyclobenzaprine  (FLEXERIL ) 5 MG tablet Take 1 tablet (5 mg total) by mouth at bedtime as needed for muscle spasms. 90 tablet 1   desloratadine  (CLARINEX ) 5 MG tablet Take 5 mg by mouth as needed.     diclofenac  Sodium (VOLTAREN ) 1 % GEL Apply 2 g topically 4 (four) times daily. Rub into affected area of foot 2 to 4 times daily (Patient taking differently: Apply 2 g topically 4 (four) times daily. Rub into affected area of foot 2 to 4 times daily as needed) 100 g 2   furosemide  (LASIX ) 40 MG tablet Take 1.5 tablets (60 mg total) by mouth daily. (Patient taking differently: Take 40 mg by mouth daily.) 45 tablet 5   gabapentin  (NEURONTIN ) 300 MG capsule Take 2 capsules (600 mg total) by mouth at bedtime as needed (pain). 60 capsule 1   ketoconazole  (NIZORAL ) 2 % cream Apply 1 Application topically daily. (Patient taking differently: Apply 1 Application topically daily as needed.) 30 g 3   Magnesium  Oxide 400 MG CAPS Take 1 capsule (400 mg total) by mouth daily. 30 capsule 11   Multiple Vitamins-Minerals (MULTIVITAMIN  WITH MINERALS) tablet Take 1 tablet by mouth daily.     nystatin   (MYCOSTATIN /NYSTOP ) powder Apply 1 Application topically 3 (three) times daily. (Patient taking differently: Apply 1 Application topically 3 (three) times daily. As needed) 60 g 0   potassium chloride  SA (KLOR-CON  M20) 20 MEQ tablet Take 1 tablet (20 mEq total) by mouth 2 (two) times daily. 60 tablet 6   tirzepatide  (MOUNJARO ) 5 MG/0.5ML Pen Inject 5 mg into the skin once a week. (Patient taking differently: Inject 5 mg into the skin once a week. Takes on  Sundays) 2 mL 0   Wheat Dextrin (BENEFIBER) POWD Take by mouth as needed.     XARELTO  20 MG TABS tablet TAKE 1 TABLET BY MOUTH DAILY WITH SUPPER 30 tablet 11   No current facility-administered medications for this encounter.   Facility-Administered Medications Ordered in Other Encounters  Medication Dose Route Frequency Provider Last Rate Last Admin   cloNIDine  (CATAPRES ) tablet 0.1 mg  0.1 mg Oral Daily Tanner, Van E., PA-C   0.1 mg at 11/21/19 1742   heparin  lock flush 100 unit/mL  500 Units Intracatheter Once Iruku, Praveena, MD       Wt Readings from Last 3 Encounters:  03/27/24 109.6 kg (241 lb 9.6 oz)  03/27/24 109.8 kg (242 lb)  03/15/24 109.3 kg (241 lb)   BP 126/74   Pulse 67   Wt 109.6 kg (241 lb 9.6 oz)   LMP 05/18/2016   SpO2 98%   BMI 48.80 kg/m  Physical Exam General:  NAD. No resp difficulty, walked into clinic with cane HEENT: Normal Neck: Supple. No JVD. Thick neck Cor: Regular rate & rhythm. No rubs, gallops or murmurs. Lungs: Clear Abdomen: Soft, obese, nontender, nondistended.  Extremities: No cyanosis, clubbing, rash, edema Neuro: Alert & oriented x 3, moves all 4 extremities w/o difficulty. Affect pleasant.  Assessment/Plan: 1. Chemotherapy-related cardiomyopathy: Fall in EF and strain in the setting of Herceptin  use.  Echo in 4/21 with EF 45-50% with GLS -12.6%.  Herceptin  was held, Coreg  started, and echo in 6/21 showed EF up to 55-60% with GLS -14.4% => Herceptin  restarted.  Echo in 7/21 with EF 55-60%,  GLS more negative at -16.9%.  Echo in 2/22 and in 9/21 showed normal EF but less negative strain.  However, strain images did not appear to track well on either echo.  Echo in 4/22  showed EF 50-55%, again with poor strain tracking.  Echo in 7/22 and in 10/22 showed EF 55-60% with GLS -20.1% and mild LVH. Echo in 5/23 showed EF 55-60% with GLS -22.1%, normal RV.  Echo 11/23 was read as 50-55% but looked like 55% on my read, with GLS -19.8% and normal RV. With painful peripheral neuropathy and carpal tunnel syndrome as well as LVH with diastolic CHF, there was concern for possible cardiac amyloidosis.  Urine and serum immunofixations negative.  PYP scan in 3/24 was equivocal.  TTR gene testing was negative. cMRI 6/24 showed LVEF 53% and no evidence of infiltrative process.  Echo in 11/24 showed EF 55%, mild LVH, normal RV.  Echo 5/25 showed EF 60-65%, normal RV, mild MR. NYHA class II-III symptoms, chronic pain and neuropathy contributory. She is not volume overloaded on exam.  - Continue Lasix  40 mg daily + 20 KCL bid. Recent labs (03/03/24) reviewed and are stable, K 3.8, SCr 1.16  - Continue Coreg  6.25 mg bid.  - Continue telmisartan /hctz.   - She should be safe to continue Herceptin  (plan for  long-term use).  With stable echoes and long-term Herceptin  use, we can continue to obtain echoes at 6 month intervals => will get the next echo in 11/25.  2. HTN: BP controlled.   - Continue current meds. 3. DVT: on left.   - Continue Xarelto  20 mg daily long-term given DVT in setting of malignancy. No bleeding issues. Recent CBC stable. 4. Obesity: Difficult for her to exercise due to neuropathy, poor balance. Body mass index is 48.8 kg/m. - She is now on tirzepatide  with weight trending down.  Follow up in 6 months with Dr. [name] + echo.  Arlice Bene [facility] FNP-BC 03/27/2024

## 2024-03-27 NOTE — Progress Notes (Signed)
 Follow-up Visit   Date: 03/27/24   [name] MRN: [id number] DOB: 1956/06/16   Interim History: [name] is a 68 y.o. 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  The patient was accompanied to the clinic by self.   IMPRESSION/PLAN: Muscle spasm. No evidence of myotonia on exam.  With her known history of cervical myelopathy, symptoms most likely may be stemming from this.   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 She is followed by orthopeadics and scheduled to have repeat NCS/EMG.   Return to clinic in 6 months   ----------------------------------------------------------------------------------- UPDATE 06/26/2022:  She feels there hands are easier to open up and she is able to grasp at items better.  She continues to have muscle atrophy and numbness in the hands. She is seeing hand specialist at [facility] who has ordered repeat NCS/EMG to assess for entrapment neuropathy.  She also saw Spine specialist at [facility] who did not feel any additional surgery was indicated for cervical canal stenosis.   UPDATE 03/27/2024:  She is here to with complaints that her hands lock up, which has been present for the last 6-12 months.  It occurs a few times per week and can be painful.  The spasm lasts about a minute and can involve the hands and feet.  It can occur randomly when doing activities or at rest.  There is no family history of cramps.  She continues to see orthopeadics and is scheduled to have NCS/EMG to evaluate for worsening carpal tunnel syndrome and ulnar neuropathy.  Medications:   Current Outpatient Medications on File Prior to Visit  Medication Sig Dispense Refill   acetaminophen  (TYLENOL ) 500 MG tablet Take 500 mg by mouth every 6 (six) hours as needed for moderate pain (pain score 4-6).     albuterol  (VENTOLIN  HFA) 108 (90 Base) MCG/ACT inhaler Inhale 2 puffs into the lungs every 6 (six) hours as needed for wheezing or shortness of breath. 8 g 0   Ascorbic Acid  (VITAMIN C PO) Take 1 tablet by mouth daily.     carvedilol  (COREG ) 6.25 MG tablet TAKE 1 TABLET BY MOUTH TWICE A DAY 60 tablet 11   Cholecalciferol (VITAMIN D3 PO) Take 1 capsule by mouth daily.     Cyanocobalamin  (B-12 PO) Take 1 capsule by mouth daily.     desloratadine  (CLARINEX ) 5 MG tablet Take 5 mg by mouth as needed.     diclofenac  Sodium (VOLTAREN ) 1 % GEL Apply 2 g topically 4 (four) times daily. Rub into affected area of foot 2 to 4 times daily 100 g 2   fluticasone  (FLONASE ) 50 MCG/ACT nasal spray Place 2 sprays into both nostrils daily. 16 g 6   furosemide  (LASIX ) 40 MG tablet Take 1.5 tablets (60 mg total) by mouth daily. 45 tablet 5   gabapentin  (NEURONTIN ) 300 MG capsule Take 2 capsules (600 mg total) by mouth at bedtime as needed (pain). (Patient not taking: Reported on 03/16/2024) 60 capsule 1   ketoconazole  (NIZORAL ) 2 % cream Apply 1 Application topically daily. 30 g 3   Magnesium  Oxide 400 MG CAPS Take 1 capsule (400 mg total) by mouth daily. 30 capsule 11  methylcellulose oral powder Take 1 packet by mouth daily.     Multiple Vitamins-Minerals (MULTIVITAMIN WITH MINERALS) tablet Take 1 tablet by mouth daily.     nystatin  (MYCOSTATIN /NYSTOP ) powder Apply 1 Application topically 3 (three) times daily. 60 g 0   potassium chloride  SA (KLOR-CON  M20) 20 MEQ tablet Take 1 tablet (20 mEq total) by mouth 2 (two) times daily. 60 tablet 6   silver  sulfADIAZINE  (SILVADENE ) 1 % cream Apply topically. (Patient not taking: Reported on 03/16/2024)     tirzepatide  (MOUNJARO ) 5 MG/0.5ML Pen Inject 5 mg into  the skin once a week. 2 mL 0   XARELTO  20 MG TABS tablet TAKE 1 TABLET BY MOUTH DAILY WITH SUPPER 30 tablet 11   Current Facility-Administered Medications on File Prior to Visit  Medication Dose Route Frequency Provider Last Rate Last Admin   cloNIDine  (CATAPRES ) tablet 0.1 mg  0.1 mg Oral Daily Tanner, Van E., PA-C   0.1 mg at 11/21/19 1742   heparin  lock flush 100 unit/mL  500 Units Intracatheter Once Iruku, Praveena, MD        Allergies:  Allergies  Allergen Reactions   Morphine And Codeine Nausea And Vomiting   Codeine Nausea Only   Darvon Nausea Only   Duloxetine  Other (See Comments)    duloxetine  hydrochloride   Duloxetine  Hcl Other (See Comments)    Head felt funny, ? thinking not right   Hydrocodone  Nausea Only   Oxycodone  Nausea Only   Pravastatin  Other (See Comments)    myalgias   Rosuvastatin  Other (See Comments)    Bone pain    Vital Signs:  LMP 05/18/2016   Neurological Exam: MENTAL STATUS including orientation to time, place, person, recent and remote memory, attention span and concentration, language, and fund of knowledge is normal.  Speech is not dysarthric.  CRANIAL NERVES:  No visual field defects.  Pupils equal round and reactive to light.  Normal conjugate, extra-ocular eye movements in all directions of gaze.  No ptosis.    MOTOR:  Moderate bilateral hand atrophy involving the intrinsic hand muscles.  No fasciculations or abnormal movements.  No pronator drift. There is no grip or percussion myotonia.   Upper Extremity:  Right   Left  Deltoid  5/5    5/5   Biceps  5/5    5/5   Triceps  5/5    5/5   Infraspinatus 5/5   5/5  Medial pectoralis 5/5   5/5  Wrist extensors  5/5    5/5   Wrist flexors  5/5    5/5   Finger extensors  5/5    5/5   Finger flexors  5/5    5/5   Dorsal interossei  3/5    3/5   Abductor pollicis  4/5    4/5   Tone (Ashworth scale)  0   0    Lower Extremity:  Right   Left  Hip flexors  5/5    5/5   Knee flexors  5/5    5/5    Knee extensors  5/5    5/5   Dorsiflexors  4/5    5/5   Plantarflexors  4/5    5/5   Toe extensors  3/5    5/5   Toe flexors  3/5    5/5   Tone (Ashworth scale)  0   0    MSRs:  Right        Left brachioradialis 3+  3+  biceps 3+  3+  triceps 3+  3+  patellar 3+  3+  ankle jerk 2+  2+   SENSORY:  Temperature reduced in the hands and feet.  Vibration reduced at the right > left ankle.   COORDINATION/GAIT: Normal finger-to- nose-finger.  Finger tapping is slowed due to hand weakness.  Gait is assisted with a cane, slow, stable.     Data: MRI cervical spine wo contrast 05/19/2021: 1. At C5-C6, mild to moderate canal stenosis with severe left and moderate right foraminal stenosis. 2. At C3-C4 and C4-C5, posterior disc versus osteophyte or OPLL contacts and flattens the ventral cord with mild canal stenosis. Mild foraminal stenosis on the right at C3-C4. 3. C4-C5 ACDF.   NCS/EMG of the arms 08/05/2020: This is an abnormal study.  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  There are also electrodiagnostic evidence of moderate left carpal tunnel syndromes.  There was no evidence of active right cervical radiculopathy.   NCS/EMG from [facility] (under media tab) which shows diffuse sensorimotor neuropathy affecting the arms, which is worse then what was seen on her NCS/EMG from October. NCS/EMG dated 10/31/2020 showed absent median, ulnar, and radial sensory responses with prolonged median and ulnar motor responses.    MRI cervical spine wo contrast 09/09/2020: 1. Prominent right central disc osteophyte complex at C4-5 with resultant severe spinal stenosis and flattening of the cervical spinal cord. No appreciable cord signal changes. 2. Broad posterior disc osteophyte at C5-6 with resultant mild spinal stenosis, with moderate left  worse than right C6 foraminal narrowing. 3. Right subarticular to foraminal disc osteophyte complex at C3-4 with resultant mild spinal stenosis, with moderate right C4 foraminal narrowing. 4. 1.5 cm cystic well-circumscribed lesion adjacent to the upper esophagus as above, indeterminate. Unclear whether this is associated with the adjacent esophagus, possibly reflecting an esophageal diverticulum, or possibly an exophytic thyroid  nodule. Further evaluation with dedicated thyroid  ultrasound recommended. (ref: J Am Coll Radiol. 2015 Feb;12(2): 143-50). 5. No evidence for metastatic disease within the cervical spine.  Labs 01/23/2021:  ESR >100, TSH 1.57, B12 508, folate 8.9, SPEP with IFE no M protein, copper 173 Labs 02/11/2021: ESR 81*, antiPR3 antibody 31*, MPO neg, SSA/B neg, RF neg, cryo neg, CRP <1.0  CSF analysis 01/27/2021:  R0 W7* P33 G53, cytology neg, OCB - identical bands in CSF and serum suggestive of systemic inflammation     Thank you for allowing me to participate in patient's care.  If I can answer any additional questions, I would be pleased to do so.    Sincerely,    Lamar Meter K. Lydia Sams, DO

## 2024-03-27 NOTE — Patient Instructions (Addendum)
 Thank you for coming in today  If you had labs drawn today, any labs that are abnormal the clinic will call you No news is good news  EKG was done today  Medications: No changes  Follow up appointments:  Your physician recommends that you schedule a follow-up appointment in:  5 months With Dr. Mclean with echocardiogram Please call in October to schedule appointment   Your physician has requested that you have an echocardiogram. Echocardiography is a painless test that uses sound waves to create images of your heart. It provides your doctor with information about the size and shape of your heart and how well your heart's chambers and valves are working. This procedure takes approximately one hour. There are no restrictions for this procedure.      Do the following things EVERYDAY: Weigh yourself in the morning before breakfast. Write it down and keep it in a log. Take your medicines as prescribed Eat low salt foods--Limit salt (sodium) to 2000 mg per day.  Stay as active as you can everyday Limit all fluids for the day to less than 2 liters   At the [facility], you and your health needs are our priority. As part of our continuing mission to provide you with exceptional heart care, we have created designated Provider Care Teams. These Care Teams include your primary Cardiologist (physician) and Advanced Practice Providers (APPs- Physician Assistants and Nurse Practitioners) who all work together to provide you with the care you need, when you need it.   You may see any of the following providers on your designated Care Team at your next follow up: Dr Jules Oar Dr Peder Bourdon Dr. Mimi Alt, NP Ruddy Corral, [place] [facility] [place], [place] Dennise Fitz, NP Luster Salters, PharmD   Please be sure to bring in all your medications bottles to every appointment.    Thank you for choosing [facility]  If you have any questions or concerns before your next appointment please send us  a message through [place] or call our office at [phone].    TO LEAVE A MESSAGE FOR THE NURSE SELECT OPTION 2, PLEASE LEAVE A MESSAGE INCLUDING: YOUR NAME DATE OF BIRTH CALL BACK NUMBER REASON FOR CALL**this is important as we prioritize the call backs  YOU WILL RECEIVE A CALL BACK THE SAME DAY AS LONG AS YOU CALL BEFORE 4:00 PM

## 2024-03-27 NOTE — Telephone Encounter (Signed)
 Left message to confirm appt for 6/10

## 2024-03-28 ENCOUNTER — Inpatient Hospital Stay (HOSPITAL_BASED_OUTPATIENT_CLINIC_OR_DEPARTMENT_OTHER): Admitting: Hematology and Oncology

## 2024-03-28 ENCOUNTER — Other Ambulatory Visit: Payer: Self-pay

## 2024-03-28 ENCOUNTER — Inpatient Hospital Stay: Attending: Hematology and Oncology

## 2024-03-28 ENCOUNTER — Other Ambulatory Visit: Payer: Self-pay | Admitting: *Deleted

## 2024-03-28 ENCOUNTER — Inpatient Hospital Stay

## 2024-03-28 VITALS — BP 129/67 | HR 63 | Temp 98.1°F | Resp 18 | Wt 242.3 lb

## 2024-03-28 DIAGNOSIS — Z5112 Encounter for antineoplastic immunotherapy: Secondary | ICD-10-CM | POA: Diagnosis present

## 2024-03-28 DIAGNOSIS — Z170 Estrogen receptor positive status [ER+]: Secondary | ICD-10-CM | POA: Insufficient documentation

## 2024-03-28 DIAGNOSIS — Z8542 Personal history of malignant neoplasm of other parts of uterus: Secondary | ICD-10-CM | POA: Diagnosis not present

## 2024-03-28 DIAGNOSIS — Z171 Estrogen receptor negative status [ER-]: Secondary | ICD-10-CM | POA: Diagnosis not present

## 2024-03-28 DIAGNOSIS — Z86718 Personal history of other venous thrombosis and embolism: Secondary | ICD-10-CM | POA: Diagnosis not present

## 2024-03-28 DIAGNOSIS — C773 Secondary and unspecified malignant neoplasm of axilla and upper limb lymph nodes: Secondary | ICD-10-CM | POA: Diagnosis present

## 2024-03-28 DIAGNOSIS — C50412 Malignant neoplasm of upper-outer quadrant of left female breast: Secondary | ICD-10-CM

## 2024-03-28 DIAGNOSIS — C50112 Malignant neoplasm of central portion of left female breast: Secondary | ICD-10-CM

## 2024-03-28 DIAGNOSIS — Z95828 Presence of other vascular implants and grafts: Secondary | ICD-10-CM

## 2024-03-28 DIAGNOSIS — C50919 Malignant neoplasm of unspecified site of unspecified female breast: Secondary | ICD-10-CM

## 2024-03-28 DIAGNOSIS — C50812 Malignant neoplasm of overlapping sites of left female breast: Secondary | ICD-10-CM | POA: Diagnosis present

## 2024-03-28 DIAGNOSIS — Z1722 Progesterone receptor negative status: Secondary | ICD-10-CM | POA: Diagnosis not present

## 2024-03-28 LAB — CBC WITH DIFFERENTIAL (CANCER CENTER ONLY)
Abs Immature Granulocytes: 0.01 10*3/uL (ref 0.00–0.07)
Basophils Absolute: 0 10*3/uL (ref 0.0–0.1)
Basophils Relative: 1 %
Eosinophils Absolute: 0.2 10*3/uL (ref 0.0–0.5)
Eosinophils Relative: 4 %
HCT: 32.4 % — ABNORMAL LOW (ref 36.0–46.0)
Hemoglobin: 10.2 g/dL — ABNORMAL LOW (ref 12.0–15.0)
Immature Granulocytes: 0 %
Lymphocytes Relative: 26 %
Lymphs Abs: 1.5 10*3/uL (ref 0.7–4.0)
MCH: 27.9 pg (ref 26.0–34.0)
MCHC: 31.5 g/dL (ref 30.0–36.0)
MCV: 88.5 fL (ref 80.0–100.0)
Monocytes Absolute: 0.6 10*3/uL (ref 0.1–1.0)
Monocytes Relative: 10 %
Neutro Abs: 3.5 10*3/uL (ref 1.7–7.7)
Neutrophils Relative %: 59 %
Platelet Count: 220 10*3/uL (ref 150–400)
RBC: 3.66 MIL/uL — ABNORMAL LOW (ref 3.87–5.11)
RDW: 14.1 % (ref 11.5–15.5)
WBC Count: 5.9 10*3/uL (ref 4.0–10.5)
nRBC: 0 % (ref 0.0–0.2)

## 2024-03-28 LAB — CMP (CANCER CENTER ONLY)
ALT: 13 U/L (ref 0–44)
AST: 17 U/L (ref 15–41)
Albumin: 3.8 g/dL (ref 3.5–5.0)
Alkaline Phosphatase: 43 U/L (ref 38–126)
Anion gap: 6 (ref 5–15)
BUN: 30 mg/dL — ABNORMAL HIGH (ref 8–23)
CO2: 29 mmol/L (ref 22–32)
Calcium: 9.4 mg/dL (ref 8.9–10.3)
Chloride: 105 mmol/L (ref 98–111)
Creatinine: 1.05 mg/dL — ABNORMAL HIGH (ref 0.44–1.00)
GFR, Estimated: 59 mL/min — ABNORMAL LOW (ref >60)
Glucose, Bld: 88 mg/dL (ref 70–99)
Potassium: 3.8 mmol/L (ref 3.5–5.1)
Sodium: 140 mmol/L (ref 135–145)
Total Bilirubin: 0.4 mg/dL (ref 0.0–1.2)
Total Protein: 7.8 g/dL (ref 6.5–8.1)

## 2024-03-28 MED ORDER — TRASTUZUMAB-ANNS CHEMO 150 MG IV SOLR
6.0000 mg/kg | Freq: Once | INTRAVENOUS | Status: AC
  Administered 2024-03-28: 600 mg via INTRAVENOUS
  Filled 2024-03-28: qty 28.57

## 2024-03-28 MED ORDER — SODIUM CHLORIDE 0.9% FLUSH: 10.0000 mL | INTRAVENOUS | Status: DC | PRN

## 2024-03-28 MED ORDER — SODIUM CHLORIDE 0.9% FLUSH
10.0000 mL | Freq: Once | INTRAVENOUS | Status: AC
  Administered 2024-03-28: 10 mL

## 2024-03-28 MED ORDER — DIPHENHYDRAMINE HCL 25 MG PO CAPS
25.0000 mg | ORAL_CAPSULE | Freq: Once | ORAL | Status: AC
  Administered 2024-03-28: 25 mg via ORAL
  Filled 2024-03-28: qty 1

## 2024-03-28 MED ORDER — HEPARIN SOD (PORK) LOCK FLUSH 100 UNIT/ML IV SOLN
500.0000 [IU] | Freq: Once | INTRAVENOUS | Status: DC | PRN
Start: 2024-03-28 — End: 2024-03-28

## 2024-03-28 MED ORDER — ACETAMINOPHEN 325 MG PO TABS
650.0000 mg | ORAL_TABLET | Freq: Once | ORAL | Status: AC
  Administered 2024-03-28: 650 mg via ORAL
  Filled 2024-03-28: qty 2

## 2024-03-28 MED ORDER — SODIUM CHLORIDE 0.9 % IV SOLN: Freq: Once | INTRAVENOUS | Status: AC

## 2024-03-28 NOTE — Assessment & Plan Note (Signed)
 This is a very pleasant 68 yr old female with metastatic breast cancer on herceptin  maintenance who is here for follow up.  Assessment and Plan Assessment & Plan Breast cancer, metastatic on herceptin  maintenance. Breast cancer with a small lymph node on imaging, not suspected malignant. Echocardiogram normal. On Herceptin  therapy. Blood counts stable. - Continue Herceptin  therapy. - Order PET CT scan for the first week of August. - Schedule follow-up appointment in August to discuss PET CT results.  Wheezing Intermittent wheezing with no significant chest x-ray findings. Previous treatments ineffective. Possible reactive airway disease or adult-onset asthma. Wheezing likely upper respiratory. - Refer to pulmonology for further evaluation and pulmonary function testing.

## 2024-03-28 NOTE — Patient Instructions (Signed)
 [facility] - A DEPT OF [facility]  Discharge Instructions: Thank you for choosing [facility] to provide your oncology and hematology care.   If you have a lab appointment with the Cancer Center, please go directly to the Cancer Center and check in at the registration area.   Wear comfortable clothing and clothing appropriate for easy access to any Portacath or PICC line.   We strive to give you quality time with your provider. You may need to reschedule your appointment if you arrive late (15 or more minutes).  Arriving late affects you and other patients whose appointments are after yours.  Also, if you miss three or more appointments without notifying the office, you may be dismissed from the clinic at the provider's discretion.      For prescription refill requests, have your pharmacy contact our office and allow 72 hours for refills to be completed.    Today you received the following chemotherapy and/or immunotherapy agents: trastuzumab-anns Ernestine Mcmurray)     To help prevent nausea and vomiting after your treatment, we encourage you to take your nausea medication as directed.  BELOW ARE SYMPTOMS THAT SHOULD BE REPORTED IMMEDIATELY: *FEVER GREATER THAN 100.4 F (38 C) OR HIGHER *CHILLS OR SWEATING *NAUSEA AND VOMITING THAT IS NOT CONTROLLED WITH YOUR NAUSEA MEDICATION *UNUSUAL SHORTNESS OF BREATH *UNUSUAL BRUISING OR BLEEDING *URINARY PROBLEMS (pain or burning when urinating, or frequent urination) *BOWEL PROBLEMS (unusual diarrhea, constipation, pain near the anus) TENDERNESS IN MOUTH AND THROAT WITH OR WITHOUT PRESENCE OF ULCERS (sore throat, sores in mouth, or a toothache) UNUSUAL RASH, SWELLING OR PAIN  UNUSUAL VAGINAL DISCHARGE OR ITCHING   Items with * indicate a potential emergency and should be followed up as soon as possible or go to the Emergency Department if any problems should occur.  Please show the CHEMOTHERAPY ALERT CARD  or IMMUNOTHERAPY ALERT CARD at check-in to the Emergency Department and triage nurse.  Should you have questions after your visit or need to cancel or reschedule your appointment, please contact [facility] - A DE[facility]  Dept: [phone]  and follow the prompts.  Office hours are 8:00 a.m. to 4:30 p.m. Monday - Friday. Please note that voicemails left after 4:00 p.m. may not be returned until the following business day.  We are closed weekends and major holidays. You have access to a nurse at all times for urgent questions. Please call the main number to the clinic Dept: [phone] and follow the prompts.   For any non-urgent questions, you may also contact your provider using MyChart. We now offer e-Visits for anyone 61 and older to request care online for non-urgent symptoms. For details visit [email or web address].   Also download the MyChart app! Go to the app store, search "MyChart", open the app, select [facility], and log in with your MyChart username and password.

## 2024-03-28 NOTE — Progress Notes (Signed)
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    Heart disease Father        COPD   Alcohol abuse Father        ETOH dependence   Breast cancer Maternal Aunt        dx in her 13s   Lung cancer Maternal Uncle    Breast cancer Paternal Aunt    Cancer Maternal Grandmother        salivary gland cancer   Colon cancer Neg Hx    Colon polyps Neg Hx    Esophageal cancer Neg Hx    Stomach cancer Neg Hx    Rectal cancer Neg Hx     Review of Systems  Constitutional:  Negative for appetite change, chills, fatigue, fever and unexpected weight change.  HENT:   Negative for hearing loss, lump/mass and trouble swallowing.   Eyes:  Negative for eye problems and icterus.  Respiratory:  Negative for chest tightness, cough and shortness of breath.   Cardiovascular:  Negative for chest pain, leg swelling and palpitations.  Gastrointestinal:  Negative for abdominal distention, abdominal pain, constipation, diarrhea, nausea and vomiting.   Endocrine: Negative for hot flashes.  Genitourinary:  Negative for difficulty urinating.   Musculoskeletal:  Negative for arthralgias.  Skin:  Negative for itching and rash.  Neurological:  Negative for dizziness, extremity weakness, headaches and numbness.  Hematological:  Negative for adenopathy. Does not bruise/bleed easily.  Psychiatric/Behavioral:  Negative for depression. The patient is not nervous/anxious.       PHYSICAL EXAMINATION     Vitals:   03/28/24 0911 03/28/24 0912  BP: (!) 147/85 129/67  Pulse: 63   Resp: 18   Temp: 98.1 F (36.7 C)   SpO2: 100%       Physical Exam Constitutional:      General: She is not in acute distress.    Appearance: Normal appearance. She is not toxic-appearing.  HENT:     Head: Normocephalic and atraumatic.     Mouth/Throat:     Mouth: Mucous membranes are moist.     Pharynx: Oropharynx is clear. No oropharyngeal exudate or posterior oropharyngeal erythema.  Eyes:     General: No scleral icterus. Cardiovascular:     Rate and Rhythm: Normal rate and regular rhythm.     Pulses: Normal pulses.  Heart sounds: Normal heart sounds.  Pulmonary:     Effort: Pulmonary effort is normal.     Breath sounds: Wheezing (Mild expiratory wheeze in the left upper lobe) present.  Abdominal:     General: Abdomen is flat. Bowel sounds are normal. There is no distension.     Palpations: Abdomen is soft.     Tenderness: There is no abdominal tenderness.  Musculoskeletal:        General: Swelling (BLE noted) present.     Cervical back: Neck supple.  Lymphadenopathy:     Cervical: No cervical adenopathy.  Skin:    General: Skin is warm and dry.     Findings: No rash.  Neurological:     General: No focal deficit present.     Mental Status: She is alert.     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        Behavior: Behavior normal.      LABORATORY DATA:  CBC    Component Value Date/Time   WBC 5.9 03/28/2024 0854   WBC 6.4 09/27/2020 1114   RBC 3.66 (L) 03/28/2024 0854   HGB 10.2 (L) 03/28/2024 0854   HGB 11.9 03/30/2017 1044   HCT 32.4 (L) 03/28/2024 0854   HCT 37.4 03/30/2017 1044   PLT 220 03/28/2024 0854   PLT 238 03/30/2017 1044   MCV 88.5 03/28/2024 0854   MCV 85.0 03/30/2017 1044   MCH 27.9 03/28/2024 0854   MCHC 31.5 03/28/2024 0854   RDW 14.1 03/28/2024 0854   RDW 15.1 (H) 03/30/2017 1044   LYMPHSABS 1.5 03/28/2024 0854   LYMPHSABS 1.3 03/30/2017 1044   MONOABS 0.6 03/28/2024 0854   MONOABS 0.5 03/30/2017 1044   EOSABS 0.2 03/28/2024 0854   EOSABS 0.0 03/30/2017 1044   BASOSABS 0.0 03/28/2024 0854   BASOSABS 0.0 03/30/2017 1044    CMP     Component Value Date/Time   NA 140 03/28/2024 0854   NA 143 03/30/2017 1044   K 3.8 03/28/2024 0854   K 3.8 03/30/2017 1044   CL 105 03/28/2024 0854   CL 106 01/18/2013 0909   CO2 29 03/28/2024 0854   CO2 27 03/30/2017 1044   GLUCOSE 88 03/28/2024 0854   GLUCOSE 91 03/30/2017 1044   GLUCOSE 99 01/18/2013 0909   BUN 30 (H) 03/28/2024 0854   BUN 9.7 03/30/2017 1044   CREATININE 1.05 (H) 03/28/2024 0854   CREATININE 0.8 03/30/2017 1044   CALCIUM  9.4 03/28/2024 0854   CALCIUM  9.7 03/30/2017 1044   PROT 7.8 03/28/2024 0854   PROT 8.0 03/30/2017 1044   ALBUMIN 3.8 03/28/2024 0854   ALBUMIN 3.8 01/27/2021 1232   ALBUMIN 3.4 (L) 03/30/2017 1044   AST 17 03/28/2024 0854   AST 17 03/30/2017 1044   ALT 13 03/28/2024 0854   ALT 15 03/30/2017 1044   ALKPHOS 43 03/28/2024 0854   ALKPHOS 60 03/30/2017 1044   BILITOT 0.4 03/28/2024 0854   BILITOT 0.34 03/30/2017 1044   GFRNONAA 59 (L) 03/28/2024 0854   GFRAA >60 07/11/2020 0923    ASSESSMENT and THERAPY PLAN:   Malignant neoplasm of central portion of left breast in female, estrogen receptor negative (HCC) This is a very pleasant 68 yr old female with metastatic breast cancer on herceptin   maintenance who is here for follow up.  Assessment and Plan Assessment & Plan Breast cancer, metastatic on herceptin   maintenance. Breast cancer with a small lymph node on imaging, not suspected malignant. Echocardiogram normal. On Herceptin  therapy. Blood counts stable. - Continue Herceptin  therapy. - Order PET CT scan for the first week of August. - Schedule follow-up appointment in August to discuss PET CT results.  Wheezing Intermittent wheezing with no significant chest x-ray findings. Previous treatments ineffective. Possible reactive airway disease or adult-onset asthma. Wheezing likely upper respiratory. - Refer to pulmonology for further evaluation and pulmonary function testing.       All questions were answered. The patient knows to call the clinic with any problems, questions or concerns. We can certainly see the patient much sooner if necessary.  Total encounter time:30 minutes*in face-to-face visit time, chart review, lab review, care coordination, order entry, and documentation of the encounter time.   *Total Encounter Time as defined by the Centers for Medicare and Medicaid Services includes, in addition to the face-to-face time of a patient visit (documented in the note above) non-face-to-face time: obtaining and reviewing outside history, ordering and reviewing medications, tests or procedures, care coordination (communications with other health care professionals or caregivers) and documentation in the medical record.

## 2024-03-29 ENCOUNTER — Ambulatory Visit (HOSPITAL_COMMUNITY): Payer: Self-pay | Admitting: Family Medicine

## 2024-03-31 ENCOUNTER — Encounter: Payer: Self-pay | Admitting: Pharmacist

## 2024-03-31 ENCOUNTER — Ambulatory Visit (HOSPITAL_COMMUNITY)
Admission: RE | Admit: 2024-03-31 | Discharge: 2024-03-31 | Disposition: A | Discharge status: Home / Self Care | Source: Ambulatory Visit | Attending: Internal Medicine | Admitting: Internal Medicine

## 2024-03-31 DIAGNOSIS — R0600 Dyspnea, unspecified: Secondary | ICD-10-CM | POA: Diagnosis present

## 2024-03-31 MED ORDER — SODIUM CHLORIDE (PF) 0.9 % IJ SOLN
INTRAMUSCULAR | Status: AC
  Filled 2024-03-31: qty 50

## 2024-03-31 MED ORDER — IOHEXOL 350 MG/ML SOLN
75.0000 mL | Freq: Once | INTRAVENOUS | Status: AC | PRN
  Administered 2024-03-31: 75 mL via INTRAVENOUS

## 2024-03-31 MED ORDER — HEPARIN SOD (PORK) LOCK FLUSH 100 UNIT/ML IV SOLN
500.0000 [IU] | Freq: Once | INTRAVENOUS | Status: AC
  Administered 2024-03-31: 500 [IU] via INTRAVENOUS

## 2024-04-03 ENCOUNTER — Encounter: Payer: Self-pay | Admitting: Physical Therapy

## 2024-04-03 ENCOUNTER — Ambulatory Visit: Attending: Neurology | Admitting: Physical Therapy

## 2024-04-03 ENCOUNTER — Other Ambulatory Visit: Payer: Self-pay

## 2024-04-03 DIAGNOSIS — R293 Abnormal posture: Secondary | ICD-10-CM | POA: Insufficient documentation

## 2024-04-03 DIAGNOSIS — G959 Disease of spinal cord, unspecified: Secondary | ICD-10-CM | POA: Insufficient documentation

## 2024-04-03 DIAGNOSIS — M6281 Muscle weakness (generalized): Secondary | ICD-10-CM | POA: Insufficient documentation

## 2024-04-03 DIAGNOSIS — M62838 Other muscle spasm: Secondary | ICD-10-CM | POA: Insufficient documentation

## 2024-04-03 DIAGNOSIS — G629 Polyneuropathy, unspecified: Secondary | ICD-10-CM | POA: Diagnosis not present

## 2024-04-03 DIAGNOSIS — R2681 Unsteadiness on feet: Secondary | ICD-10-CM | POA: Insufficient documentation

## 2024-04-03 DIAGNOSIS — R260 Ataxic gait: Secondary | ICD-10-CM | POA: Diagnosis present
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
## 2024-04-04 ENCOUNTER — Other Ambulatory Visit: Payer: Self-pay | Admitting: Podiatry

## 2024-04-05 MED ORDER — MOUNJARO 7.5 MG/0.5ML ~~LOC~~ SOAJ: 7.5000 mg | SUBCUTANEOUS | 0 refills | Status: DC

## 2024-04-06 ENCOUNTER — Encounter: Payer: Self-pay | Admitting: Podiatry

## 2024-04-06 ENCOUNTER — Ambulatory Visit (INDEPENDENT_AMBULATORY_CARE_PROVIDER_SITE_OTHER): Admitting: Podiatry

## 2024-04-06 DIAGNOSIS — M722 Plantar fascial fibromatosis: Secondary | ICD-10-CM

## 2024-04-06 DIAGNOSIS — B351 Tinea unguium: Secondary | ICD-10-CM

## 2024-04-06 DIAGNOSIS — M76821 Posterior tibial tendinitis, right leg: Secondary | ICD-10-CM | POA: Diagnosis not present

## 2024-04-06 DIAGNOSIS — M19071 Primary osteoarthritis, right ankle and foot: Secondary | ICD-10-CM | POA: Diagnosis not present
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
## 2024-04-10 ENCOUNTER — Ambulatory Visit: Payer: Self-pay

## 2024-04-10 NOTE — Telephone Encounter (Signed)
 Copied from CRM [id number]. Topic: Clinical - Red Word Triage >> Apr 10, 2024  3:39 PM Rilla B wrote: [facility] that prompted transfer to Nurse Triage: Difficult breathing, loud wheezing    Reason for Disposition  [1] MILD longstanding difficulty breathing AND [2]  SAME as normal  Answer Assessment - Initial Assessment Questions 1. RESPIRATORY STATUS: Describe your breathing? (e.g., wheezing, shortness of breath, unable to speak, severe coughing)      Wheezing  2. ONSET: When did this breathing problem begin?      2 months  3. PATTERN Does the difficult breathing come and go, or has it been constant since it started?      Intermittent  4. SEVERITY: How bad is your breathing? (e.g., mild, moderate, severe)    - MILD: No SOB at rest, mild SOB with walking, speaks normally in sentences, can lie down, no retractions, pulse < 100.    - MODERATE: SOB at rest, SOB with minimal exertion and prefers to sit, cannot lie down flat, speaks in phrases, mild retractions, audible wheezing, pulse 100-120.    - SEVERE: Very SOB at rest, speaks in single words, struggling to breathe, sitting hunched forward, retractions, pulse > 120      None to mild 5. RECURRENT SYMPTOM: Have you had difficulty breathing before? If Yes, ask: When was the last time? and What happened that time?      No 6. CARDIAC HISTORY: Do you have any history of heart disease? (e.g., heart attack, angina, bypass surgery, angioplasty)      Yes 7. LUNG HISTORY: Do you have any history of lung disease?  (e.g., pulmonary embolus, asthma, emphysema)     Yes 8. CAUSE: What do you think is causing the breathing problem?      Unsure  9. OTHER SYMPTOMS: Do you have any other symptoms? (e.g., dizziness, runny nose, cough, chest pain, fever)     Intermittent dry cough  10. O2 SATURATION MONITOR:  Do you use an oxygen saturation monitor (pulse oximeter) at home? If Yes, ask: What is your reading (oxygen level) today? What  is your usual oxygen saturation reading? (e.g., 95%)       No  Protocols used: Breathing Difficulty-A-AH    FYI Only or Action Required?: FYI only for provider.  Called Nurse Triage reporting Shortness of Breath. Symptoms began 2 months ago. Interventions attempted: Other: has been evaluated and had a CT chest completed. Symptoms are: unchanged.  Triage Disposition: See PCP Within 2 Weeks (Patient scheduled for earliest new patient appointment)  Patient/caregiver understands and will follow disposition?: Yes

## 2024-04-11 ENCOUNTER — Encounter: Payer: Self-pay | Admitting: Physical Therapy

## 2024-04-11 ENCOUNTER — Ambulatory Visit: Admitting: Physical Therapy

## 2024-04-11 DIAGNOSIS — M6281 Muscle weakness (generalized): Secondary | ICD-10-CM

## 2024-04-11 DIAGNOSIS — M62838 Other muscle spasm: Secondary | ICD-10-CM | POA: Diagnosis not present

## 2024-04-11 DIAGNOSIS — R293 Abnormal posture: Secondary | ICD-10-CM

## 2024-04-11 DIAGNOSIS — R2681 Unsteadiness on feet: Secondary | ICD-10-CM

## 2024-04-11 NOTE — Patient Instructions (Signed)
 You Can Walk For A Certain Length Of Time Each Day              Walk 2 minutes 2 times per day.  Next week increase to 3-4 minutes at least once a day!  Continue increasing by 1-2 minutes each week until you can walk 15 minutes without stopping.  Access Code: [email or web address] URL: [email or web address] Date: 04/11/2024 Prepared by: Daved Bull  Exercises - Sit to Stand with Counter Support  - 1 x daily - 7 x weekly - 2 sets - 10 reps - Standing Tandem Balance with Counter Support  - 1 x daily - 4 x weekly - 1 sets - 2 reps - 30-60 seconds hold - Standing March with Counter Support  - 1 x daily - 4 x weekly - 2 sets - 20 reps - Standing Hip Abduction with Counter Support  - 1 x daily - 4 x weekly - 2 sets - 10 reps
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
## 2024-04-18 ENCOUNTER — Ambulatory Visit: Attending: Neurology | Admitting: Physical Therapy

## 2024-04-18 ENCOUNTER — Other Ambulatory Visit: Payer: Self-pay | Admitting: *Deleted

## 2024-04-18 ENCOUNTER — Encounter: Payer: Self-pay | Admitting: Physical Therapy

## 2024-04-18 DIAGNOSIS — M62838 Other muscle spasm: Secondary | ICD-10-CM | POA: Insufficient documentation

## 2024-04-18 DIAGNOSIS — R293 Abnormal posture: Secondary | ICD-10-CM | POA: Diagnosis present

## 2024-04-18 DIAGNOSIS — R2681 Unsteadiness on feet: Secondary | ICD-10-CM | POA: Insufficient documentation

## 2024-04-18 DIAGNOSIS — M6281 Muscle weakness (generalized): Secondary | ICD-10-CM | POA: Diagnosis present

## 2024-04-18 DIAGNOSIS — Z171 Estrogen receptor negative status [ER-]: Secondary | ICD-10-CM
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
## 2024-04-18 NOTE — Patient Instructions (Signed)
 Access Code: [email or web address] URL: [email or web address] Date: 04/18/2024 Prepared by: Daved Bull  Exercises - Sit to Stand with Counter Support  - 1 x daily - 7 x weekly - 2 sets - 10 reps - Standing Tandem Balance with Counter Support  - 1 x daily - 4 x weekly - 1 sets - 2 reps - 30-60 seconds hold - Standing March with Counter Support  - 1 x daily - 4 x weekly - 2 sets - 20 reps - Standing Hip Abduction with Counter Support  - 1 x daily - 4 x weekly - 2 sets - 10 reps - Standing Balance in Corner with Eyes Closed  - 1 x daily - 4 x weekly - 1 sets - 2-3 reps - 30 seconds hold - [place] Stance Eyes Closed on Foam Pad  - 1 x daily - 4 x weekly - 1 sets - 2-3 reps - 30 seconds hold - Standing on Foam Pad  - 1 x daily - 4 x weekly - 1 sets - 2-3 reps - 30 seconds hold - Corner Balance Feet Together With Eyes Open  - 1 x daily - 4 x weekly - 1 sets - 2-3 reps - 30-45 seconds hold - Standing Near Stance in [place] with Eyes Closed  - 1 x daily - 4 x weekly - 1 sets - 2-3 reps - 30-45 seconds hold - Tandem Stance in Corner  - 1 x daily - 4 x weekly - 1 sets - 2-3 reps - 30-45 seconds hold

## 2024-04-19 ENCOUNTER — Inpatient Hospital Stay

## 2024-04-19 ENCOUNTER — Encounter: Payer: Self-pay | Admitting: Hematology and Oncology

## 2024-04-19 ENCOUNTER — Inpatient Hospital Stay: Attending: Hematology and Oncology

## 2024-04-19 VITALS — BP 128/78 | HR 72 | Temp 98.2°F | Resp 18 | Wt 242.5 lb

## 2024-04-19 DIAGNOSIS — Z170 Estrogen receptor positive status [ER+]: Secondary | ICD-10-CM | POA: Insufficient documentation

## 2024-04-19 DIAGNOSIS — Z8542 Personal history of malignant neoplasm of other parts of uterus: Secondary | ICD-10-CM | POA: Insufficient documentation

## 2024-04-19 DIAGNOSIS — Z171 Estrogen receptor negative status [ER-]: Secondary | ICD-10-CM

## 2024-04-19 DIAGNOSIS — C773 Secondary and unspecified malignant neoplasm of axilla and upper limb lymph nodes: Secondary | ICD-10-CM | POA: Insufficient documentation

## 2024-04-19 DIAGNOSIS — C50812 Malignant neoplasm of overlapping sites of left female breast: Secondary | ICD-10-CM | POA: Insufficient documentation

## 2024-04-19 DIAGNOSIS — Z1721 Progesterone receptor positive status: Secondary | ICD-10-CM | POA: Diagnosis not present

## 2024-04-19 DIAGNOSIS — Z1732 Human epidermal growth factor receptor 2 negative status: Secondary | ICD-10-CM | POA: Diagnosis not present

## 2024-04-19 DIAGNOSIS — Z5112 Encounter for antineoplastic immunotherapy: Secondary | ICD-10-CM | POA: Insufficient documentation

## 2024-04-19 LAB — CBC WITH DIFFERENTIAL (CANCER CENTER ONLY)
Abs Immature Granulocytes: 0.02 K/uL (ref 0.00–0.07)
Basophils Absolute: 0 K/uL (ref 0.0–0.1)
Basophils Relative: 1 %
Eosinophils Absolute: 0.2 K/uL (ref 0.0–0.5)
Eosinophils Relative: 3 %
HCT: 32.9 % — ABNORMAL LOW (ref 36.0–46.0)
Hemoglobin: 10.8 g/dL — ABNORMAL LOW (ref 12.0–15.0)
Immature Granulocytes: 0 %
Lymphocytes Relative: 25 %
Lymphs Abs: 1.5 K/uL (ref 0.7–4.0)
MCH: 28.6 pg (ref 26.0–34.0)
MCHC: 32.8 g/dL (ref 30.0–36.0)
MCV: 87.3 fL (ref 80.0–100.0)
Monocytes Absolute: 0.8 K/uL (ref 0.1–1.0)
Monocytes Relative: 13 %
Neutro Abs: 3.5 K/uL (ref 1.7–7.7)
Neutrophils Relative %: 58 %
Platelet Count: 215 K/uL (ref 150–400)
RBC: 3.77 MIL/uL — ABNORMAL LOW (ref 3.87–5.11)
RDW: 14.2 % (ref 11.5–15.5)
WBC Count: 6.1 K/uL (ref 4.0–10.5)
nRBC: 0 % (ref 0.0–0.2)

## 2024-04-19 LAB — CMP (CANCER CENTER ONLY)
ALT: 13 U/L (ref 0–44)
AST: 17 U/L (ref 15–41)
Albumin: 3.8 g/dL (ref 3.5–5.0)
Alkaline Phosphatase: 45 U/L (ref 38–126)
Anion gap: 7 (ref 5–15)
BUN: 32 mg/dL — ABNORMAL HIGH (ref 8–23)
CO2: 30 mmol/L (ref 22–32)
Calcium: 9.7 mg/dL (ref 8.9–10.3)
Chloride: 102 mmol/L (ref 98–111)
Creatinine: 1.1 mg/dL — ABNORMAL HIGH (ref 0.44–1.00)
GFR, Estimated: 56 mL/min — ABNORMAL LOW (ref >60)
Glucose, Bld: 94 mg/dL (ref 70–99)
Potassium: 3.7 mmol/L (ref 3.5–5.1)
Sodium: 139 mmol/L (ref 135–145)
Total Bilirubin: 0.4 mg/dL (ref 0.0–1.2)
Total Protein: 8.1 g/dL (ref 6.5–8.1)

## 2024-04-19 MED ORDER — DIPHENHYDRAMINE HCL 25 MG PO CAPS
25.0000 mg | ORAL_CAPSULE | Freq: Once | ORAL | Status: AC
  Administered 2024-04-19: 25 mg via ORAL

## 2024-04-19 MED ORDER — TRASTUZUMAB-ANNS CHEMO 150 MG IV SOLR
6.0000 mg/kg | Freq: Once | INTRAVENOUS | Status: AC
  Administered 2024-04-19: 600 mg via INTRAVENOUS
  Filled 2024-04-19: qty 28.57

## 2024-04-19 MED ORDER — SODIUM CHLORIDE 0.9 % IV SOLN: Freq: Once | INTRAVENOUS | Status: AC

## 2024-04-19 MED ORDER — ACETAMINOPHEN 325 MG PO TABS
650.0000 mg | ORAL_TABLET | Freq: Once | ORAL | Status: AC
  Administered 2024-04-19: 650 mg via ORAL

## 2024-04-19 NOTE — Patient Instructions (Signed)
 [facility] - A DEPT OF [facility]  Discharge Instructions: Thank you for choosing [facility] to provide your oncology and hematology care.   If you have a lab appointment with the Cancer Center, please go directly to the Cancer Center and check in at the registration area.   Wear comfortable clothing and clothing appropriate for easy access to any Portacath or PICC line.   We strive to give you quality time with your provider. You may need to reschedule your appointment if you arrive late (15 or more minutes).  Arriving late affects you and other patients whose appointments are after yours.  Also, if you miss three or more appointments without notifying the office, you may be dismissed from the clinic at the provider's discretion.      For prescription refill requests, have your pharmacy contact our office and allow 72 hours for refills to be completed.    Today you received the following chemotherapy and/or immunotherapy agents: trastuzumab-anns [name])     To help prevent nausea and vomiting after your treatment, we encourage you to take your nausea medication as directed.  BELOW ARE SYMPTOMS THAT SHOULD BE REPORTED IMMEDIATELY: *FEVER GREATER THAN 100.4 F (38 C) OR HIGHER *CHILLS OR SWEATING *NAUSEA AND VOMITING THAT IS NOT CONTROLLED WITH YOUR NAUSEA MEDICATION *UNUSUAL SHORTNESS OF BREATH *UNUSUAL BRUISING OR BLEEDING *URINARY PROBLEMS (pain or burning when urinating, or frequent urination) *BOWEL PROBLEMS (unusual diarrhea, constipation, pain near the anus) TENDERNESS IN MOUTH AND THROAT WITH OR WITHOUT PRESENCE OF ULCERS (sore throat, sores in mouth, or a toothache) UNUSUAL RASH, SWELLING OR PAIN  UNUSUAL VAGINAL DISCHARGE OR ITCHING   Items with * indicate a potential emergency and should be followed up as soon as possible or go to the Emergency Department if any problems should occur.  Please show the CHEMOTHERAPY ALERT CARD  or IMMUNOTHERAPY ALERT CARD at check-in to the Emergency Department and triage nurse.  Should you have questions after your visit or need to cancel or reschedule your appointment, please contact [facility] - A DEPT OF [facility]  Dept: [phone]  and follow the prompts.  Office hours are 8:00 a.m. to 4:30 p.m. Monday - Friday. Please note that voicemails left after 4:00 p.m. may not be returned until the following business day.  We are closed weekends and major holidays. You have access to a nurse at all times for urgent questions. Please call the main number to the clinic Dept: [phone] and follow the prompts.   For any non-urgent questions, you may also contact your provider using MyChart. We now offer e-Visits for anyone 61 and older to request care online for non-urgent symptoms. For details visit [email or web address].   Also download the MyChart app! Go to the app store, search "MyChart", open the app, select [facility], and log in with your MyChart username and password.

## 2024-04-25 ENCOUNTER — Ambulatory Visit: Admitting: Physical Therapy

## 2024-05-02 ENCOUNTER — Ambulatory Visit: Admitting: Physical Therapy

## 2024-05-02 ENCOUNTER — Encounter: Payer: Self-pay | Admitting: Physical Therapy

## 2024-05-02 VITALS — BP 136/50 | HR 71

## 2024-05-02 DIAGNOSIS — M6281 Muscle weakness (generalized): Secondary | ICD-10-CM

## 2024-05-02 DIAGNOSIS — R293 Abnormal posture: Secondary | ICD-10-CM | POA: Diagnosis not present

## 2024-05-02 DIAGNOSIS — M62838 Other muscle spasm: Secondary | ICD-10-CM

## 2024-05-02 DIAGNOSIS — R2681 Unsteadiness on feet: Secondary | ICD-10-CM

## 2024-05-02 NOTE — Therapy (Signed)
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 static balance and strengthening.  Gait training w/ SPC.  Endurance - SciFit; RLE NMR.  Compliant surfaces/unlevel ground.  Obstacles.  Hip and knee flexion.  Modified SLS.   Daved KATHEE Bull, PT, DPT 05/02/2024, 9:33 AM

## 2024-05-09 ENCOUNTER — Encounter: Payer: Self-pay | Admitting: Physical Therapy

## 2024-05-09 ENCOUNTER — Ambulatory Visit: Admitting: Physical Therapy

## 2024-05-09 DIAGNOSIS — M62838 Other muscle spasm: Secondary | ICD-10-CM

## 2024-05-09 DIAGNOSIS — M6281 Muscle weakness (generalized): Secondary | ICD-10-CM

## 2024-05-09 DIAGNOSIS — R293 Abnormal posture: Secondary | ICD-10-CM

## 2024-05-09 DIAGNOSIS — R2681 Unsteadiness on feet: Secondary | ICD-10-CM
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
## 2024-05-10 ENCOUNTER — Other Ambulatory Visit: Payer: Self-pay | Admitting: *Deleted

## 2024-05-10 DIAGNOSIS — C50112 Malignant neoplasm of central portion of left female breast: Secondary | ICD-10-CM

## 2024-05-11 ENCOUNTER — Encounter: Payer: Self-pay | Admitting: Hematology and Oncology

## 2024-05-11 ENCOUNTER — Inpatient Hospital Stay

## 2024-05-11 VITALS — BP 130/71 | HR 74 | Temp 98.4°F | Resp 18 | Ht 59.0 in | Wt 245.0 lb

## 2024-05-11 DIAGNOSIS — Z171 Estrogen receptor negative status [ER-]: Secondary | ICD-10-CM

## 2024-05-11 DIAGNOSIS — Z95828 Presence of other vascular implants and grafts: Secondary | ICD-10-CM

## 2024-05-11 DIAGNOSIS — Z5112 Encounter for antineoplastic immunotherapy: Secondary | ICD-10-CM | POA: Diagnosis not present

## 2024-05-11 LAB — CMP (CANCER CENTER ONLY)
ALT: 11 U/L (ref 0–44)
AST: 15 U/L (ref 15–41)
Albumin: 3.5 g/dL (ref 3.5–5.0)
Alkaline Phosphatase: 40 U/L (ref 38–126)
Anion gap: 5 (ref 5–15)
BUN: 30 mg/dL — ABNORMAL HIGH (ref 8–23)
CO2: 29 mmol/L (ref 22–32)
Calcium: 9.5 mg/dL (ref 8.9–10.3)
Chloride: 105 mmol/L (ref 98–111)
Creatinine: 1.12 mg/dL — ABNORMAL HIGH (ref 0.44–1.00)
GFR, Estimated: 55 mL/min — ABNORMAL LOW (ref >60)
Glucose, Bld: 112 mg/dL — ABNORMAL HIGH (ref 70–99)
Potassium: 4 mmol/L (ref 3.5–5.1)
Sodium: 139 mmol/L (ref 135–145)
Total Bilirubin: 0.4 mg/dL (ref 0.0–1.2)
Total Protein: 7.6 g/dL (ref 6.5–8.1)

## 2024-05-11 LAB — CBC WITH DIFFERENTIAL (CANCER CENTER ONLY)
Abs Immature Granulocytes: 0.01 K/uL (ref 0.00–0.07)
Basophils Absolute: 0 K/uL (ref 0.0–0.1)
Basophils Relative: 1 %
Eosinophils Absolute: 0.2 K/uL (ref 0.0–0.5)
Eosinophils Relative: 4 %
HCT: 30.2 % — ABNORMAL LOW (ref 36.0–46.0)
Hemoglobin: 9.6 g/dL — ABNORMAL LOW (ref 12.0–15.0)
Immature Granulocytes: 0 %
Lymphocytes Relative: 24 %
Lymphs Abs: 1.4 K/uL (ref 0.7–4.0)
MCH: 28.2 pg (ref 26.0–34.0)
MCHC: 31.8 g/dL (ref 30.0–36.0)
MCV: 88.8 fL (ref 80.0–100.0)
Monocytes Absolute: 0.6 K/uL (ref 0.1–1.0)
Monocytes Relative: 11 %
Neutro Abs: 3.6 K/uL (ref 1.7–7.7)
Neutrophils Relative %: 60 %
Platelet Count: 213 K/uL (ref 150–400)
RBC: 3.4 MIL/uL — ABNORMAL LOW (ref 3.87–5.11)
RDW: 14.1 % (ref 11.5–15.5)
WBC Count: 5.9 K/uL (ref 4.0–10.5)
nRBC: 0 % (ref 0.0–0.2)

## 2024-05-11 MED ORDER — SODIUM CHLORIDE 0.9% FLUSH
10.0000 mL | INTRAVENOUS | Status: DC | PRN
  Administered 2024-05-11: 10 mL

## 2024-05-11 MED ORDER — SODIUM CHLORIDE 0.9% FLUSH
10.0000 mL | Freq: Once | INTRAVENOUS | Status: AC
  Administered 2024-05-11: 10 mL

## 2024-05-11 MED ORDER — TRASTUZUMAB-ANNS CHEMO 150 MG IV SOLR
6.0000 mg/kg | Freq: Once | INTRAVENOUS | Status: AC
  Administered 2024-05-11: 600 mg via INTRAVENOUS
  Filled 2024-05-11: qty 28.57

## 2024-05-11 MED ORDER — DIPHENHYDRAMINE HCL 25 MG PO CAPS
25.0000 mg | ORAL_CAPSULE | Freq: Once | ORAL | Status: AC
  Administered 2024-05-11: 25 mg via ORAL
  Filled 2024-05-11: qty 1

## 2024-05-11 MED ORDER — SODIUM CHLORIDE 0.9 % IV SOLN: Freq: Once | INTRAVENOUS | Status: AC

## 2024-05-11 MED ORDER — ACETAMINOPHEN 325 MG PO TABS
650.0000 mg | ORAL_TABLET | Freq: Once | ORAL | Status: AC
  Administered 2024-05-11: 650 mg via ORAL
  Filled 2024-05-11: qty 2

## 2024-05-11 MED ORDER — HEPARIN SOD (PORK) LOCK FLUSH 100 UNIT/ML IV SOLN
500.0000 [IU] | Freq: Once | INTRAVENOUS | Status: AC | PRN
  Administered 2024-05-11: 500 [IU]

## 2024-05-11 NOTE — Patient Instructions (Signed)
 [facility] A [facility]  Discharge Instructions: Thank you for choosing [facility] to provide your oncology and hematology care.   If you have a lab appointment with the Cancer Center, please go directly to the Cancer Center and check in at the registration area.   Wear comfortable clothing and clothing appropriate for easy access to any Portacath or PICC line.   We strive to give you quality time with your provider. You may need to reschedule your appointment if you arrive late (15 or more minutes).  Arriving late affects you and other patients whose appointments are after yours.  Also, if you miss three or more appointments without notifying the office, you may be dismissed from the clinic at the provider's discretion.      For prescription refill requests, have your pharmacy contact our office and allow 72 hours for refills to be completed.    Today you received the following chemotherapy and/or immunotherapy agents: Kanjinti      To help prevent nausea and vomiting after your treatment, we encourage you to take your nausea medication as directed.  BELOW ARE SYMPTOMS THAT SHOULD BE REPORTED IMMEDIATELY: *FEVER GREATER THAN 100.4 F (38 C) OR HIGHER *CHILLS OR SWEATING *NAUSEA AND VOMITING THAT IS NOT CONTROLLED WITH YOUR NAUSEA MEDICATION *UNUSUAL SHORTNESS OF BREATH *UNUSUAL BRUISING OR BLEEDING *URINARY PROBLEMS (pain or burning when urinating, or frequent urination) *BOWEL PROBLEMS (unusual diarrhea, constipation, pain near the anus) TENDERNESS IN MOUTH AND THROAT WITH OR WITHOUT PRESENCE OF ULCERS (sore throat, sores in mouth, or a toothache) UNUSUAL RASH, SWELLING OR PAIN  UNUSUAL VAGINAL DISCHARGE OR ITCHING   Items with * indicate a potential emergency and should be followed up as soon as possible or go to the Emergency Department if any problems should occur.  Please show the CHEMOTHERAPY ALERT CARD or IMMUNOTHERAPY  ALERT CARD at check-in to the Emergency Department and triage nurse.  Should you have questions after your visit or need to cancel or reschedule your appointment, please contact [facility]  Dept: [phone]  and follow the prompts.  Office hours are 8:00 a.m. to 4:30 p.m. Monday - Friday. Please note that voicemails left after 4:00 p.m. may not be returned until the following business day.  We are closed weekends and major holidays. You have access to a nurse at all times for urgent questions. Please call the main number to the clinic Dept: [phone] and follow the prompts.   For any non-urgent questions, you may also contact your provider using MyChart. We now offer e-Visits for anyone 60 and older to request care online for non-urgent symptoms. For details visit [email or web address].   Also download the MyChart app! Go to the app store, search "MyChart", open the app, select [place], and log in with your MyChart username and password.

## 2024-05-12 ENCOUNTER — Other Ambulatory Visit: Payer: Self-pay | Admitting: Internal Medicine

## 2024-05-15 ENCOUNTER — Ambulatory Visit: Payer: Self-pay

## 2024-05-15 NOTE — Telephone Encounter (Signed)
 FYI Only or Action Required?: FYI only for provider.  Patient was last seen in primary care on 03/15/2024 by [name] ORN, MD.  Called Nurse Triage reporting head ache.  Symptoms began yesterday.  Interventions attempted: Nothing.  Symptoms are: gradually worsening.  Triage Disposition: See PCP When Office is Open (Within 3 Days)  Patient/caregiver understands and will follow disposition?: Yes   Copied from CRM #[id number]. Topic: Clinical - Red Word Triage >> May 15, 2024 10:39 AM Antonio DEL wrote: Red Word that prompted transfer to Nurse Triage: Patient stated on yesterday, it felt like she had been struck in her temple. Says she is concerned because she has gotten that pain before but not that often. Yesterday was the worst, the pain was very sharp and she was in tears. Slight soreness today. Reason for Disposition  [1] New-onset headache AND [2] age > 50 years  Answer Assessment - Initial Assessment Questions 1. LOCATION: Where does it hurt?      Left temple pain 2. ONSET: When did the headache start? (e.g., minutes, hours, days)      One day ago, extreme pain, felt like she was struck by lightening or something electrical.  States that it was sharp for a 3-4 seconds, then eased off.   3. PATTERN: Does the pain come and go, or has it been constant since it started?     This has happened a couple of times in the past few months 4. SEVERITY: How bad is the pain? and What does it keep you from doing?  (e.g., Scale 1-10; mild, moderate, or severe)     10 5. RECURRENT SYMPTOM: Have you ever had headaches before? If Yes, ask: When was the last time? and What happened that time?      This has happened a couple of times in the past few months 6. CAUSE: What do you think is causing the headache?     unknown 7. MIGRAINE: Have you been diagnosed with migraine headaches? If Yes, ask: Is this headache similar?      denies 8. HEAD INJURY: Has there been any recent injury  to your head?      denies 9. OTHER SYMPTOMS: Do you have any other symptoms? (e.g., fever, stiff neck, eye pain, sore throat, cold symptoms)     States that her shoulder and neck are always sore/stiff.  Previous surgery to c 4 - c 5 spine  Protocols used: Headache-A-AH

## 2024-05-16 ENCOUNTER — Encounter: Payer: Self-pay | Admitting: Internal Medicine

## 2024-05-16 ENCOUNTER — Ambulatory Visit: Admitting: Physical Therapy

## 2024-05-16 ENCOUNTER — Encounter: Payer: Self-pay | Admitting: Physical Therapy

## 2024-05-16 ENCOUNTER — Ambulatory Visit (INDEPENDENT_AMBULATORY_CARE_PROVIDER_SITE_OTHER): Admitting: Internal Medicine

## 2024-05-16 ENCOUNTER — Ambulatory Visit: Payer: Self-pay | Admitting: Internal Medicine

## 2024-05-16 VITALS — BP 104/68 | HR 63 | Temp 97.7°F | Ht 59.0 in | Wt 243.4 lb

## 2024-05-16 DIAGNOSIS — G4452 New daily persistent headache (NDPH): Secondary | ICD-10-CM

## 2024-05-16 DIAGNOSIS — R2681 Unsteadiness on feet: Secondary | ICD-10-CM

## 2024-05-16 DIAGNOSIS — M6281 Muscle weakness (generalized): Secondary | ICD-10-CM

## 2024-05-16 DIAGNOSIS — M62838 Other muscle spasm: Secondary | ICD-10-CM

## 2024-05-16 DIAGNOSIS — E119 Type 2 diabetes mellitus without complications: Secondary | ICD-10-CM

## 2024-05-16 DIAGNOSIS — Z23 Encounter for immunization: Secondary | ICD-10-CM | POA: Diagnosis not present

## 2024-05-16 DIAGNOSIS — R062 Wheezing: Secondary | ICD-10-CM | POA: Insufficient documentation

## 2024-05-16 DIAGNOSIS — R293 Abnormal posture: Secondary | ICD-10-CM

## 2024-05-16 DIAGNOSIS — E559 Vitamin D deficiency, unspecified: Secondary | ICD-10-CM

## 2024-05-16 DIAGNOSIS — Z7985 Long-term (current) use of injectable non-insulin antidiabetic drugs: Secondary | ICD-10-CM

## 2024-05-16 DIAGNOSIS — I10 Essential (primary) hypertension: Secondary | ICD-10-CM

## 2024-05-16 LAB — POCT GLYCOSYLATED HEMOGLOBIN (HGB A1C): Hemoglobin A1C: 6 % — AB (ref 4.0–5.6)

## 2024-05-16 MED ORDER — BUDESONIDE-FORMOTEROL FUMARATE 160-4.5 MCG/ACT IN AERO
2.0000 | INHALATION_SPRAY | Freq: Two times a day (BID) | RESPIRATORY_TRACT | 3 refills | Status: DC
Start: 2024-05-16 — End: 2024-06-21

## 2024-05-16 NOTE — Therapy (Signed)
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 condition, difficulty walking, decreased strength, increased muscle spasms, impaired sensation, improper body mechanics, and postural dysfunction.   ACTIVITY LIMITATIONS: carrying, lifting, bending, standing, squatting, stairs, transfers, reach over head, and locomotion level  PARTICIPATION LIMITATIONS: shopping and community activity  PERSONAL FACTORS: Age, Fitness, Past/current experiences, Time since onset of injury/illness/exacerbation, and 3+ comorbidities: DM2, HTN, and prior orthopedic surgeries to BLE are also affecting patient's functional outcome.   REHAB POTENTIAL: Good  CLINICAL DECISION MAKING: Evolving/moderate complexity  EVALUATION COMPLEXITY: Moderate  PLAN:  PT FREQUENCY: 1x/week  PT DURATION: 8 weeks  PLANNED INTERVENTIONS: 97164- PT Re-evaluation, 97750- Physical Performance  Testing, 97110-Therapeutic exercises, 97530- Therapeutic activity, [id number]- Neuromuscular re-education, 97535- Self Care, [id number]- Manual therapy, [phone]- Gait training, [phone]- Aquatic Therapy, [phone]- Electrical stimulation (manual), Patient/Family education, Balance training, Stair training, Taping, Joint mobilization, Vestibular training, and DME instructions  PLAN FOR NEXT SESSION: Expand HEP for static balance and strengthening.  Gait training w/ SPC.  Endurance - SciFit; RLE NMR.  Compliant surfaces/unlevel ground.  Obstacles.  Hip and knee flexion.  Modified SLS.  Add stretching of BLE to HEP from cramp/spasm management!    Daved KATHEE Bull, PT, DPT 05/16/2024, 9:33 AM

## 2024-05-16 NOTE — Assessment & Plan Note (Signed)
 High suspicion for possible trigeminal neuralgia but not clear, hold specific tx, but will need brain MRI, consider neurology vs neurosurgeon
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
## 2024-05-16 NOTE — Assessment & Plan Note (Signed)
 Last vitamin D Lab Results  Component Value Date   VD25OH 37.66 07/10/2022   Low, to start oral replacement

## 2024-05-16 NOTE — Assessment & Plan Note (Signed)
 Etiology unclear but has audible diffuse exp wheezing on exam, for symbicort  160 trial

## 2024-05-16 NOTE — Assessment & Plan Note (Signed)
 BP Readings from Last 3 Encounters:  05/16/24 104/68  05/11/24 130/71  05/02/24 (!) 136/50   Stable, pt to continue medical treatment coreg  6,25 bid, micardis  hct 80 12.5 qd

## 2024-05-16 NOTE — Progress Notes (Signed)
 The test results show that your current treatment is OK, as the tests are stable.  Please continue the same plan.  There is no other need for change of treatment or further evaluation based on these results, at this time.  thanks

## 2024-05-16 NOTE — Assessment & Plan Note (Signed)
 Lab Results  Component Value Date   HGBA1C 6.0 (A) 05/16/2024   Stable, pt to continue current medical treatment mounjaro  7.5 mg weekly

## 2024-05-16 NOTE — Patient Instructions (Signed)
 You had the Prevnar 20 pneumonia shot today  Your A1c was done today  Please take all new medication as prescribed - the symbicort   Please continue all other medications as before, and refills have been done if requested.  Please have the pharmacy call with any other refills you may need..  Please keep your appointments with your specialists as you may have planned - pulmonary next week  You will be contacted regarding the referral for: MRI brain  You will be contacted by phone if any changes need to be made immediately.  Otherwise, you will receive a letter about your results with an explanation, but please check with MyChart first.  Please make an Appointment to return in 6 months, or sooner if needed

## 2024-05-17 ENCOUNTER — Encounter: Payer: Self-pay | Admitting: Hematology and Oncology

## 2024-05-17 ENCOUNTER — Telehealth: Payer: Self-pay | Admitting: Pharmacist

## 2024-05-17 NOTE — Telephone Encounter (Signed)
 I spoke with patient who states that she stopped Mounjaro  due to stomach and muscle cramping.  She also states that she did not see it helping much.  She also started wheezing on and off and everyone is unsure what the causes.  She has if there is an alternative to Mounjaro .  We discussed that she could try Ozempic, historically Mounjaro  is more effective but we certainly could try it and see if it works better in her.  She would like to see the pulmonologist first and then get back to us .

## 2024-05-18 ENCOUNTER — Ambulatory Visit
Admission: RE | Admit: 2024-05-18 | Discharge: 2024-05-18 | Disposition: A | Discharge status: Home / Self Care | Source: Ambulatory Visit | Attending: Internal Medicine | Admitting: Internal Medicine

## 2024-05-18 DIAGNOSIS — G4452 New daily persistent headache (NDPH): Secondary | ICD-10-CM

## 2024-05-19 ENCOUNTER — Encounter: Payer: Self-pay | Admitting: Internal Medicine

## 2024-05-19 ENCOUNTER — Ambulatory Visit: Admitting: Internal Medicine

## 2024-05-19 ENCOUNTER — Ambulatory Visit: Payer: Self-pay

## 2024-05-19 VITALS — BP 128/86 | HR 63 | Temp 98.0°F | Ht 59.0 in

## 2024-05-19 DIAGNOSIS — Z7985 Long-term (current) use of injectable non-insulin antidiabetic drugs: Secondary | ICD-10-CM

## 2024-05-19 DIAGNOSIS — M79601 Pain in right arm: Secondary | ICD-10-CM | POA: Diagnosis not present

## 2024-05-19 DIAGNOSIS — I10 Essential (primary) hypertension: Secondary | ICD-10-CM | POA: Diagnosis not present

## 2024-05-19 DIAGNOSIS — E559 Vitamin D deficiency, unspecified: Secondary | ICD-10-CM | POA: Diagnosis not present

## 2024-05-19 DIAGNOSIS — E119 Type 2 diabetes mellitus without complications: Secondary | ICD-10-CM

## 2024-05-19 MED ORDER — AZITHROMYCIN 250 MG PO TABS: ORAL_TABLET | ORAL | 1 refills | Status: AC

## 2024-05-19 MED ORDER — PREDNISONE 10 MG PO TABS
ORAL_TABLET | ORAL | 0 refills | Status: DC
Start: 2024-05-19 — End: 2024-06-21

## 2024-05-19 NOTE — Assessment & Plan Note (Signed)
 Pt with unfortunate reaction after prevnar 20 July 28, likely allergic but can't r/o infectious - for zpack, x 1, and prednisone  taper, cool compresses, tylenol  prn

## 2024-05-19 NOTE — Assessment & Plan Note (Signed)
 BP Readings from Last 3 Encounters:  05/19/24 128/86  05/16/24 104/68  05/11/24 130/71   Stable, pt to continue medical treatment coreg  6.25 bid, micardis  hct 80 12.5 mg qd

## 2024-05-19 NOTE — Telephone Encounter (Signed)
 FYI Only or Action Required?: FYI only for provider.  Patient was last seen in primary care on 05/16/2024 by [name] ORN, MD.  Called Nurse Triage reporting Medication Reaction.  Symptoms began several days ago.  Interventions attempted: Prescription medications: pneumonia vaccine.  Symptoms are: gradually worsening.  Triage Disposition: No disposition on file.  Patient/caregiver understands and will follow disposition?:    Copied from CRM [id number]. Topic: Clinical - Red Word Triage >> May 19, 2024 10:05 AM Larissa RAMAN wrote: [facility] that prompted transfer to Nurse Triage: patient complains of rash on RT arm due to pneumonia injection Reason for Disposition  Fever present > 3 days (72 hours)  Answer Assessment - Initial Assessment Questions 1. SYMPTOMS: What is the main symptom? (e.g., pain, redness, or swelling at injection site; feeling tired, fever, muscle aches)      Swelling on right arm, red spots, area is size of orange 2. ONSET: When was the vaccine (shot) given? How much later did the rash begin? (e.g., hours, days ago)      Tuesday 3. SEVERITY: How bad is it?      severe 4. FEVER: Do you have a fever? If Yes, ask: What is your temperature, how was it measured, and when did it start?      denies 5. IMMUNIZATIONS GIVEN: What shots have you recently received?     Pneumonia vaccine 6. PAST REACTIONS: Have you reacted to immunizations before? If Yes, ask: What happened?     Some time ago 7. OTHER SYMPTOMS: Do you have any other symptoms?     denies 8. PREGNANCY: Is there any chance you are pregnant? When was your last menstrual period?     na  Protocols used: Immunization Reactions-A-AH
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
## 2024-05-19 NOTE — Patient Instructions (Signed)
 The Prevnar 20 is added to your allergy list  Please take all new medication as prescribed  - the antibiotic, and prednisone   Please continue all other medications as before, and refills have been done if requested.  Please have the pharmacy call with any other refills you may need.  Please keep your appointments with your specialists as you may have planned

## 2024-05-19 NOTE — Assessment & Plan Note (Signed)
 Last vitamin D Lab Results  Component Value Date   VD25OH 37.66 07/10/2022   Low, to start oral replacement

## 2024-05-19 NOTE — Assessment & Plan Note (Signed)
 Lab Results  Component Value Date   HGBA1C 6.0 (A) 05/16/2024   Stable, pt to continue current medical treatment mounjaro  7.5 mg weekly

## 2024-05-23 ENCOUNTER — Ambulatory Visit: Admitting: Physical Therapy

## 2024-05-24 ENCOUNTER — Encounter: Payer: Self-pay | Admitting: Pulmonary Disease

## 2024-05-24 ENCOUNTER — Ambulatory Visit: Admitting: Pulmonary Disease

## 2024-05-24 VITALS — BP 132/90 | HR 56 | Temp 97.7°F | Ht 59.0 in | Wt 247.6 lb

## 2024-05-24 DIAGNOSIS — J45909 Unspecified asthma, uncomplicated: Secondary | ICD-10-CM | POA: Diagnosis not present

## 2024-05-24 DIAGNOSIS — R0602 Shortness of breath: Secondary | ICD-10-CM

## 2024-05-24 DIAGNOSIS — Z171 Estrogen receptor negative status [ER-]: Secondary | ICD-10-CM

## 2024-05-24 DIAGNOSIS — J9801 Acute bronchospasm: Secondary | ICD-10-CM

## 2024-05-24 LAB — NITRIC OXIDE: Nitric Oxide: 15

## 2024-05-24 NOTE — Patient Instructions (Signed)
 VISIT SUMMARY:  During your visit, we discussed your ongoing shortness of breath and wheezing, which you have been experiencing since May. We reviewed your previous chest X-rays and CT scan results, which showed no abnormalities. We also discussed your concerns about using Symbicort  with your current medication, carvedilol , and addressed your experience with albuterol .  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 You should start using Symbicort  twice daily, with two puffs each time, and remember to rinse your mouth after use. We also provided education on the proper use of the Symbicort  inhaler.  INSTRUCTIONS:  Please schedule pulmonary function tests (PFT) at the [facility] location in [place]. Follow up in four to six weeks with the PFT results and an appointment with Advanced Practice Provider (APP) or MD.
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
## 2024-05-26 ENCOUNTER — Ambulatory Visit: Attending: Neurology | Admitting: Physical Therapy

## 2024-05-26 ENCOUNTER — Encounter: Payer: Self-pay | Admitting: Physical Therapy

## 2024-05-26 DIAGNOSIS — R293 Abnormal posture: Secondary | ICD-10-CM | POA: Diagnosis present

## 2024-05-26 DIAGNOSIS — R2681 Unsteadiness on feet: Secondary | ICD-10-CM | POA: Insufficient documentation

## 2024-05-26 DIAGNOSIS — M62838 Other muscle spasm: Secondary | ICD-10-CM | POA: Diagnosis present

## 2024-05-26 DIAGNOSIS — R260 Ataxic gait: Secondary | ICD-10-CM | POA: Insufficient documentation

## 2024-05-26 DIAGNOSIS — M6281 Muscle weakness (generalized): Secondary | ICD-10-CM | POA: Diagnosis present

## 2024-05-26 NOTE — Patient Instructions (Signed)
-   Seated Hamstring Stretch  - 1 x daily - 4 x weekly - 1 sets - 4-5 reps - 10-20 seconds hold - Gastroc Stretch on Wall  - 1 x daily - 4 x weekly - 1 sets - 2-3 reps - 40-60 seconds hold - Supine Hip Internal and External Rotation  - 1 x daily - 4 x weekly - 1-2 sets - 10 reps - 3-5 seconds hold - Standing Hip Flexor Stretch  - 1 x daily - 4 x weekly - 1 sets - 2-3 reps - 45 seconds hold

## 2024-05-26 NOTE — Therapy (Signed)
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 strengthening.  Gait training w/ SPC.  Endurance - Sci[employer]; RLE NMR.  Compliant surfaces/unlevel ground.  Obstacles.  Hip and knee flexion.  Modified SLS.     Daved KATHEE Bull, PT, DPT 05/26/2024, 9:28 AM

## 2024-05-30 ENCOUNTER — Encounter: Payer: Self-pay | Admitting: Physical Therapy

## 2024-05-30 ENCOUNTER — Ambulatory Visit: Admitting: Physical Therapy

## 2024-05-30 ENCOUNTER — Ambulatory Visit: Admitting: Internal Medicine

## 2024-05-30 ENCOUNTER — Encounter: Payer: Self-pay | Admitting: Pulmonary Disease

## 2024-05-30 DIAGNOSIS — J45909 Unspecified asthma, uncomplicated: Secondary | ICD-10-CM

## 2024-05-30 DIAGNOSIS — R0602 Shortness of breath: Secondary | ICD-10-CM | POA: Diagnosis not present

## 2024-05-30 DIAGNOSIS — M62838 Other muscle spasm: Secondary | ICD-10-CM

## 2024-05-30 DIAGNOSIS — M6281 Muscle weakness (generalized): Secondary | ICD-10-CM

## 2024-05-30 DIAGNOSIS — R293 Abnormal posture: Secondary | ICD-10-CM

## 2024-05-30 DIAGNOSIS — R2681 Unsteadiness on feet: Secondary | ICD-10-CM

## 2024-05-30 DIAGNOSIS — J9801 Acute bronchospasm: Secondary | ICD-10-CM

## 2024-05-30 LAB — PULMONARY FUNCTION TEST
DL/VA % pred: 119 %
DL/VA: 5.2 ml/min/mmHg/L
DLCO cor % pred: 82 %
DLCO cor: 13.78 ml/min/mmHg
DLCO unc % pred: 82 %
DLCO unc: 13.78 ml/min/mmHg
FEF 25-75 Post: 1.75 L/s
FEF 25-75 Pre: 1.72 L/s
FEF2575-%Change-Post: 1 %
FEF2575-%Pred-Post: 94 %
FEF2575-%Pred-Pre: 92 %
FEV1-%Change-Post: 0 %
FEV1-%Pred-Post: 67 %
FEV1-%Pred-Pre: 66 %
FEV1-Post: 1.31 L
FEV1-Pre: 1.31 L
FEV1FVC-%Change-Post: 2 %
FEV1FVC-%Pred-Pre: 108 %
FEV6-%Change-Post: -1 %
FEV6-%Pred-Post: 62 %
FEV6-%Pred-Pre: 63 %
FEV6-Post: 1.54 L
FEV6-Pre: 1.57 L
FEV6FVC-%Pred-Post: 104 %
FEV6FVC-%Pred-Pre: 104 %
FVC-%Change-Post: -1 %
FVC-%Pred-Post: 60 %
FVC-%Pred-Pre: 61 %
FVC-Post: 1.54 L
FVC-Pre: 1.57 L
Post FEV1/FVC ratio: 85 %
Post FEV6/FVC ratio: 100 %
Pre FEV1/FVC ratio: 83 %
Pre FEV6/FVC Ratio: 100 %
RV % pred: 73 %
RV: 1.35 L
TLC % pred: 74 %
TLC: 3.21 L

## 2024-05-30 NOTE — Patient Instructions (Signed)
 Full PFT performed today.

## 2024-05-30 NOTE — Progress Notes (Signed)
Full PFT performed today, 

## 2024-05-30 NOTE — Therapy (Signed)
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 dynamic navigation.  Continue per POC.  OBJECTIVE IMPAIRMENTS: Abnormal gait, decreased activity tolerance, decreased balance, decreased endurance, decreased knowledge of condition, difficulty walking, decreased strength, increased muscle spasms, impaired sensation, improper body mechanics, and postural dysfunction.   ACTIVITY LIMITATIONS: carrying, lifting, bending, standing, squatting, stairs, transfers, reach over head, and locomotion level  PARTICIPATION LIMITATIONS: shopping and community activity  PERSONAL FACTORS: Age, Fitness, Past/current experiences, Time since onset of injury/illness/exacerbation, and 3+ comorbidities: DM2, HTN, and prior orthopedic surgeries to BLE are also affecting patient's functional outcome.   REHAB POTENTIAL: Good  CLINICAL DECISION MAKING:  Evolving/moderate complexity  EVALUATION COMPLEXITY: Moderate  PLAN:  PT FREQUENCY: 1x/week  PT DURATION: 8 weeks  PLANNED INTERVENTIONS: 97164- PT Re-evaluation, 97750- Physical Performance Testing, 97110-Therapeutic exercises, 97530- Therapeutic activity, [id number]- Neuromuscular re-education, 97535- Self Care, [id number]- Manual therapy, [phone]- Gait training, [phone]- Aquatic Therapy, [phone]- Electrical stimulation (manual), Patient/Family education, Balance training, Stair training, Taping, Joint mobilization, Vestibular training, and DME instructions  PLAN FOR NEXT SESSION: Expand HEP for static balance and strengthening.  Gait training w/ SPC.  Endurance - SciFit; RLE NMR.  Compliant surfaces/unlevel ground.  Obstacles.  Hip and knee  flexion.  Modified SLS.    ASSESS LTGs - D/C?   Daved KATHEE Bull, PT, DPT 05/30/2024, 10:18 AM

## 2024-05-31 ENCOUNTER — Other Ambulatory Visit: Payer: Self-pay

## 2024-05-31 DIAGNOSIS — C50812 Malignant neoplasm of overlapping sites of left female breast: Secondary | ICD-10-CM

## 2024-06-01 ENCOUNTER — Inpatient Hospital Stay (HOSPITAL_BASED_OUTPATIENT_CLINIC_OR_DEPARTMENT_OTHER): Admitting: Hematology and Oncology

## 2024-06-01 ENCOUNTER — Inpatient Hospital Stay

## 2024-06-01 ENCOUNTER — Inpatient Hospital Stay: Attending: Hematology and Oncology

## 2024-06-01 ENCOUNTER — Ambulatory Visit: Payer: Self-pay | Admitting: Pulmonary Disease

## 2024-06-01 ENCOUNTER — Encounter: Payer: Self-pay | Admitting: Hematology and Oncology

## 2024-06-01 VITALS — BP 132/78 | HR 62 | Temp 98.2°F | Resp 18 | Wt 247.0 lb

## 2024-06-01 DIAGNOSIS — Z171 Estrogen receptor negative status [ER-]: Secondary | ICD-10-CM | POA: Insufficient documentation

## 2024-06-01 DIAGNOSIS — Z95828 Presence of other vascular implants and grafts: Secondary | ICD-10-CM

## 2024-06-01 DIAGNOSIS — Z1722 Progesterone receptor negative status: Secondary | ICD-10-CM | POA: Insufficient documentation

## 2024-06-01 DIAGNOSIS — Z86718 Personal history of other venous thrombosis and embolism: Secondary | ICD-10-CM | POA: Diagnosis not present

## 2024-06-01 DIAGNOSIS — Z5112 Encounter for antineoplastic immunotherapy: Secondary | ICD-10-CM | POA: Diagnosis present

## 2024-06-01 DIAGNOSIS — C50812 Malignant neoplasm of overlapping sites of left female breast: Secondary | ICD-10-CM

## 2024-06-01 DIAGNOSIS — Z79810 Long term (current) use of selective estrogen receptor modulators (SERMs): Secondary | ICD-10-CM | POA: Diagnosis not present

## 2024-06-01 DIAGNOSIS — C773 Secondary and unspecified malignant neoplasm of axilla and upper limb lymph nodes: Secondary | ICD-10-CM | POA: Insufficient documentation

## 2024-06-01 DIAGNOSIS — C50112 Malignant neoplasm of central portion of left female breast: Secondary | ICD-10-CM

## 2024-06-01 DIAGNOSIS — Z170 Estrogen receptor positive status [ER+]: Secondary | ICD-10-CM | POA: Insufficient documentation

## 2024-06-01 DIAGNOSIS — Z1731 Human epidermal growth factor receptor 2 positive status: Secondary | ICD-10-CM | POA: Diagnosis not present

## 2024-06-01 LAB — CBC WITH DIFFERENTIAL (CANCER CENTER ONLY)
Abs Immature Granulocytes: 0.05 K/uL (ref 0.00–0.07)
Basophils Absolute: 0 K/uL (ref 0.0–0.1)
Basophils Relative: 1 %
Eosinophils Absolute: 0.2 K/uL (ref 0.0–0.5)
Eosinophils Relative: 2 %
HCT: 30.7 % — ABNORMAL LOW (ref 36.0–46.0)
Hemoglobin: 9.5 g/dL — ABNORMAL LOW (ref 12.0–15.0)
Immature Granulocytes: 1 %
Lymphocytes Relative: 23 %
Lymphs Abs: 1.6 K/uL (ref 0.7–4.0)
MCH: 28 pg (ref 26.0–34.0)
MCHC: 30.9 g/dL (ref 30.0–36.0)
MCV: 90.6 fL (ref 80.0–100.0)
Monocytes Absolute: 0.6 K/uL (ref 0.1–1.0)
Monocytes Relative: 9 %
Neutro Abs: 4.5 K/uL (ref 1.7–7.7)
Neutrophils Relative %: 64 %
Platelet Count: 209 K/uL (ref 150–400)
RBC: 3.39 MIL/uL — ABNORMAL LOW (ref 3.87–5.11)
RDW: 15.1 % (ref 11.5–15.5)
WBC Count: 7 K/uL (ref 4.0–10.5)
nRBC: 0 % (ref 0.0–0.2)

## 2024-06-01 LAB — CMP (CANCER CENTER ONLY)
ALT: 15 U/L (ref 0–44)
AST: 14 U/L — ABNORMAL LOW (ref 15–41)
Albumin: 3.6 g/dL (ref 3.5–5.0)
Alkaline Phosphatase: 45 U/L (ref 38–126)
Anion gap: 5 (ref 5–15)
BUN: 19 mg/dL (ref 8–23)
CO2: 29 mmol/L (ref 22–32)
Calcium: 8.8 mg/dL — ABNORMAL LOW (ref 8.9–10.3)
Chloride: 107 mmol/L (ref 98–111)
Creatinine: 0.85 mg/dL (ref 0.44–1.00)
GFR, Estimated: >60 mL/min (ref >60)
Glucose, Bld: 135 mg/dL — ABNORMAL HIGH (ref 70–99)
Potassium: 3.9 mmol/L (ref 3.5–5.1)
Sodium: 141 mmol/L (ref 135–145)
Total Bilirubin: 0.4 mg/dL (ref 0.0–1.2)
Total Protein: 7.3 g/dL (ref 6.5–8.1)

## 2024-06-01 MED ORDER — DIPHENHYDRAMINE HCL 25 MG PO CAPS
25.0000 mg | ORAL_CAPSULE | Freq: Once | ORAL | Status: AC
  Administered 2024-06-01: 25 mg via ORAL

## 2024-06-01 MED ORDER — ACETAMINOPHEN 325 MG PO TABS
650.0000 mg | ORAL_TABLET | Freq: Once | ORAL | Status: AC
  Administered 2024-06-01: 650 mg via ORAL

## 2024-06-01 MED ORDER — SODIUM CHLORIDE 0.9 % IV SOLN: Freq: Once | INTRAVENOUS | Status: AC

## 2024-06-01 MED ORDER — SODIUM CHLORIDE 0.9% FLUSH
10.0000 mL | Freq: Once | INTRAVENOUS | Status: AC
  Administered 2024-06-01: 10 mL

## 2024-06-01 MED ORDER — TRASTUZUMAB-ANNS CHEMO 150 MG IV SOLR
6.0000 mg/kg | Freq: Once | INTRAVENOUS | Status: AC
  Administered 2024-06-01: 600 mg via INTRAVENOUS
  Filled 2024-06-01: qty 28.57

## 2024-06-01 NOTE — Patient Instructions (Signed)
 [facility] - A DEPT OF [facility]  Discharge Instructions: Thank you for choosing [facility] to provide your oncology and hematology care.   If you have a lab appointment with the Cancer Center, please go directly to the Cancer Center and check in at the registration area.   Wear comfortable clothing and clothing appropriate for easy access to any Portacath or PICC line.   We strive to give you quality time with your provider. You may need to reschedule your appointment if you arrive late (15 or more minutes).  Arriving late affects you and other patients whose appointments are after yours.  Also, if you miss three or more appointments without notifying the office, you may be dismissed from the clinic at the provider's discretion.      For prescription refill requests, have your pharmacy contact our office and allow 72 hours for refills to be completed.    Today you received the following chemotherapy and/or immunotherapy agents: trastuzumab -anns (KANJINTI )     To help prevent nausea and vomiting after your treatment, we encourage you to take your nausea medication as directed.  BELOW ARE SYMPTOMS THAT SHOULD BE REPORTED IMMEDIATELY: *FEVER GREATER THAN 100.4 F (38 C) OR HIGHER *CHILLS OR SWEATING *NAUSEA AND VOMITING THAT IS NOT CONTROLLED WITH YOUR NAUSEA MEDICATION *UNUSUAL SHORTNESS OF BREATH *UNUSUAL BRUISING OR BLEEDING *URINARY PROBLEMS (pain or burning when urinating, or frequent urination) *BOWEL PROBLEMS (unusual diarrhea, constipation, pain near the anus) TENDERNESS IN MOUTH AND THROAT WITH OR WITHOUT PRESENCE OF ULCERS (sore throat, sores in mouth, or a toothache) UNUSUAL RASH, SWELLING OR PAIN  UNUSUAL VAGINAL DISCHARGE OR ITCHING   Items with * indicate a potential emergency and should be followed up as soon as possible or go to the Emergency Department if any problems should occur.  Please show the CHEMOTHERAPY ALERT CARD  or IMMUNOTHERAPY ALERT CARD at check-in to the Emergency Department and triage nurse.  Should you have questions after your visit or need to cancel or reschedule your appointment, please contact [facility] - A DEPT OF [facility]  Dept: [phone]  and follow the prompts.  Office hours are 8:00 a.m. to 4:30 p.m. Monday - Friday. Please note that voicemails left after 4:00 p.m. may not be returned until the following business day.  We are closed weekends and major holidays. You have access to a nurse at all times for urgent questions. Please call the main number to the clinic Dept: [phone] and follow the prompts.   For any non-urgent questions, you may also contact your provider using MyChart. We now offer e-Visits for anyone 61 and older to request care online for non-urgent symptoms. For details visit [email or web address].   Also download the MyChart app! Go to the app store, search MyChart, open the app, select [place], and log in with your MyChart username and password.

## 2024-06-01 NOTE — Assessment & Plan Note (Signed)
 This is a very pleasant 68 yr old female with metastatic breast cancer on herceptin  maintenance who is here for follow up.  Assessment and Plan Assessment & Plan Breast cancer, metastatic on herceptin  maintenance. Breast cancer with a small lymph node on imaging, not suspected malignant. Echocardiogram normal. On Herceptin  therapy. Blood counts stable. - Continue Herceptin  therapy. - Order PET CT scan for the first week of August. - Schedule follow-up appointment in August to discuss PET CT results.  Wheezing Intermittent wheezing with no significant chest x-ray findings. Previous treatments ineffective. Possible reactive airway disease or adult-onset asthma. Wheezing likely upper respiratory. - Refer to pulmonology for further evaluation and pulmonary function testing.

## 2024-06-01 NOTE — Progress Notes (Signed)
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 questionnaire    Fear of Current or Ex-Partner: No    Emotionally Abused: No    Physically Abused: No    Sexually Abused: No    FAMILY HISTORY: Family History  Problem Relation Age of Onset   Diabetes Mother    Hypertension Mother    Ovarian cancer Mother    Heart disease Father        COPD   Alcohol abuse Father        ETOH dependence   Breast cancer Maternal Aunt        dx in her 94s   Lung cancer Maternal Uncle    Breast cancer Paternal Aunt    Cancer Maternal Grandmother        salivary gland cancer   Colon cancer Neg Hx    Colon polyps Neg Hx    Esophageal cancer Neg Hx    Stomach cancer Neg Hx    Rectal cancer Neg Hx     Review of Systems  Constitutional:  Negative for appetite change, chills, fatigue, fever and unexpected weight change.  HENT:   Negative for hearing loss, lump/mass and trouble swallowing.   Eyes:  Negative for eye problems and icterus.  Respiratory:   Negative for chest tightness, cough and shortness of breath.   Cardiovascular:  Negative for chest pain, leg swelling and palpitations.  Gastrointestinal:  Negative for abdominal distention, abdominal pain, constipation, diarrhea, nausea and vomiting.  Endocrine: Negative for hot flashes.  Genitourinary:  Negative for difficulty urinating.   Musculoskeletal:  Negative for arthralgias.  Skin:  Negative for itching and rash.  Neurological:  Negative for dizziness, extremity weakness, headaches and numbness.  Hematological:  Negative for adenopathy. Does not bruise/bleed easily.  Psychiatric/Behavioral:  Negative for depression. The patient is not nervous/anxious.       PHYSICAL EXAMINATION   Onc Performance Status - 06/01/24 0900       KPS SCALE   KPS % SCORE Normal activity with effort, some s/s of disease          LMP 05/18/2016   Physical Exam Constitutional:      General: She is not in acute distress.    Appearance: Normal appearance. She is not toxic-appearing.  HENT:     Head: Normocephalic and atraumatic.     Mouth/Throat:  Mouth: Mucous membranes are moist.     Pharynx: Oropharynx is clear. No oropharyngeal exudate or posterior oropharyngeal erythema.  Eyes:     General: No scleral icterus. Cardiovascular:     Rate and Rhythm: Normal rate and regular rhythm.     Pulses: Normal pulses.     Heart sounds: Normal heart sounds.  Pulmonary:     Effort: Pulmonary effort is normal.     Breath sounds: No wheezing.  Abdominal:     General: Abdomen is flat. Bowel sounds are normal. There is no distension.     Palpations: Abdomen is soft.     Tenderness: There is no abdominal tenderness.  Musculoskeletal:        General: Swelling (BLE noted) present.     Cervical back: Neck supple.  Lymphadenopathy:     Cervical: No cervical adenopathy.  Skin:    General: Skin is warm and dry.     Findings: No rash.  Neurological:     General: No focal deficit present.      Mental Status: She is alert.     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        Behavior: Behavior normal.     LABORATORY DATA:  CBC    Component Value Date/Time   WBC 7.0 06/01/2024 0833   WBC 6.4 09/27/2020 1114   RBC 3.39 (L) 06/01/2024 0833   HGB 9.5 (L) 06/01/2024 0833   HGB 11.9 03/30/2017 1044   HCT 30.7 (L) 06/01/2024 0833   HCT 37.4 03/30/2017 1044   PLT 209 06/01/2024 0833   PLT 238 03/30/2017 1044   MCV 90.6 06/01/2024 0833   MCV 85.0 03/30/2017 1044   MCH 28.0 06/01/2024 0833   MCHC 30.9 06/01/2024 0833   RDW 15.1 06/01/2024 0833   RDW 15.1 (H) 03/30/2017 1044   LYMPHSABS 1.6 06/01/2024 0833   LYMPHSABS 1.3 03/30/2017 1044   MONOABS 0.6 06/01/2024 0833   MONOABS 0.5 03/30/2017 1044   EOSABS 0.2 06/01/2024 0833   EOSABS 0.0 03/30/2017 1044   BASOSABS 0.0 06/01/2024 0833   BASOSABS 0.0 03/30/2017 1044    CMP     Component Value Date/Time   NA 139 05/11/2024 0811   NA 143 03/30/2017 1044   K 4.0 05/11/2024 0811   K 3.8 03/30/2017 1044   CL 105 05/11/2024 0811   CL 106 01/18/2013 0909   CO2 29 05/11/2024 0811   CO2 27 03/30/2017 1044   GLUCOSE 112 (H) 05/11/2024 0811   GLUCOSE 91 03/30/2017 1044   GLUCOSE 99 01/18/2013 0909   BUN 30 (H) 05/11/2024 0811   BUN 9.7 03/30/2017 1044   CREATININE 1.12 (H) 05/11/2024 0811   CREATININE 0.8 03/30/2017 1044   CALCIUM  9.5 05/11/2024 0811   CALCIUM  9.7 03/30/2017 1044   PROT 7.6 05/11/2024 0811   PROT 8.0 03/30/2017 1044   ALBUMIN 3.5 05/11/2024 0811   ALBUMIN 3.8 01/27/2021 1232   ALBUMIN 3.4 (L) 03/30/2017 1044   AST 15 05/11/2024 0811   AST 17 03/30/2017 1044   ALT 11 05/11/2024 0811   ALT 15 03/30/2017 1044   ALKPHOS 40 05/11/2024 0811   ALKPHOS 60 03/30/2017 1044   BILITOT 0.4 05/11/2024 0811   BILITOT 0.34 03/30/2017 1044   GFRNONAA 55 (L) 05/11/2024 0811   GFRAA >60 07/11/2020 0923     ASSESSMENT and THERAPY PLAN:   Assessment  and Plan Assessment & Plan Metastatic Breast cancer on maintenance herceptin  therapy Tolerating Herceptin  well. Last echocardiogram normal. PET scan scheduled for August 18th. - Continue Herceptin  therapy. - Review PET scan results scheduled for August 18th.  Urinary incontinence Significant incontinence post-Lasix  for edema, requiring incontinence products. - Emphasized necessity of Lasix  for edema management.  Peripheral edema Edema managed with Lasix , leg swelling manageable. - Continue Lasix  therapy.  Adult onset asthma  Symbicort  prescribed. Recommend follow up with pulmonary for additional recommendatios  All questions were answered. The patient knows to call the clinic with any problems, questions or concerns. We can certainly see the patient much sooner if necessary.  Total encounter time:30 minutes*in face-to-face visit time, chart review, lab review, care coordination, order entry, and documentation of the encounter time.   *Total Encounter Time as defined by the Centers for Medicare and Medicaid Services includes, in addition to the face-to-face time of a patient visit (documented in the note above) non-face-to-face time: obtaining and reviewing outside history, ordering and reviewing medications, tests or procedures, care coordination (communications with other health care professionals or caregivers) and documentation in the medical record.

## 2024-06-05 ENCOUNTER — Encounter (HOSPITAL_COMMUNITY)
Admission: RE | Admit: 2024-06-05 | Discharge: 2024-06-05 | Disposition: A | Discharge status: Home / Self Care | Source: Ambulatory Visit | Attending: Hematology and Oncology | Admitting: Hematology and Oncology

## 2024-06-05 DIAGNOSIS — C50112 Malignant neoplasm of central portion of left female breast: Secondary | ICD-10-CM | POA: Diagnosis present

## 2024-06-05 DIAGNOSIS — C50812 Malignant neoplasm of overlapping sites of left female breast: Secondary | ICD-10-CM | POA: Diagnosis present

## 2024-06-05 DIAGNOSIS — Z171 Estrogen receptor negative status [ER-]: Secondary | ICD-10-CM | POA: Insufficient documentation

## 2024-06-05 LAB — GLUCOSE, CAPILLARY: Glucose-Capillary: 151 mg/dL — ABNORMAL HIGH (ref 70–99)

## 2024-06-05 MED ORDER — FLUDEOXYGLUCOSE F - 18 (FDG) INJECTION
12.3500 | Freq: Once | INTRAVENOUS | Status: AC
  Administered 2024-06-05: 12.34 via INTRAVENOUS

## 2024-06-06 ENCOUNTER — Emergency Department (HOSPITAL_COMMUNITY)

## 2024-06-06 ENCOUNTER — Other Ambulatory Visit: Payer: Self-pay

## 2024-06-06 ENCOUNTER — Ambulatory Visit: Admitting: Physical Therapy

## 2024-06-06 ENCOUNTER — Encounter: Payer: Self-pay | Admitting: Physical Therapy

## 2024-06-06 ENCOUNTER — Emergency Department (HOSPITAL_COMMUNITY)
Admission: EM | Admit: 2024-06-06 | Discharge: 2024-06-06 | Disposition: A | Discharge status: Home / Self Care | Attending: Emergency Medicine | Admitting: Emergency Medicine

## 2024-06-06 ENCOUNTER — Encounter (HOSPITAL_COMMUNITY): Payer: Self-pay

## 2024-06-06 VITALS — BP 114/76 | HR 45

## 2024-06-06 DIAGNOSIS — S0990XA Unspecified injury of head, initial encounter: Secondary | ICD-10-CM | POA: Diagnosis not present

## 2024-06-06 DIAGNOSIS — M25512 Pain in left shoulder: Secondary | ICD-10-CM | POA: Insufficient documentation

## 2024-06-06 DIAGNOSIS — Z79899 Other long term (current) drug therapy: Secondary | ICD-10-CM | POA: Diagnosis not present

## 2024-06-06 DIAGNOSIS — I509 Heart failure, unspecified: Secondary | ICD-10-CM | POA: Diagnosis not present

## 2024-06-06 DIAGNOSIS — I110 Hypertensive heart disease with heart failure: Secondary | ICD-10-CM | POA: Insufficient documentation

## 2024-06-06 DIAGNOSIS — M62838 Other muscle spasm: Secondary | ICD-10-CM

## 2024-06-06 DIAGNOSIS — E119 Type 2 diabetes mellitus without complications: Secondary | ICD-10-CM | POA: Insufficient documentation

## 2024-06-06 DIAGNOSIS — R293 Abnormal posture: Secondary | ICD-10-CM | POA: Diagnosis not present

## 2024-06-06 DIAGNOSIS — M542 Cervicalgia: Secondary | ICD-10-CM | POA: Diagnosis present

## 2024-06-06 DIAGNOSIS — M25562 Pain in left knee: Secondary | ICD-10-CM | POA: Insufficient documentation

## 2024-06-06 DIAGNOSIS — W010XXA Fall on same level from slipping, tripping and stumbling without subsequent striking against object, initial encounter: Secondary | ICD-10-CM | POA: Diagnosis not present

## 2024-06-06 DIAGNOSIS — Z7901 Long term (current) use of anticoagulants: Secondary | ICD-10-CM | POA: Insufficient documentation

## 2024-06-06 DIAGNOSIS — Z8673 Personal history of transient ischemic attack (TIA), and cerebral infarction without residual deficits: Secondary | ICD-10-CM | POA: Diagnosis not present

## 2024-06-06 DIAGNOSIS — I6789 Other cerebrovascular disease: Secondary | ICD-10-CM | POA: Insufficient documentation

## 2024-06-06 DIAGNOSIS — R260 Ataxic gait: Secondary | ICD-10-CM

## 2024-06-06 DIAGNOSIS — R2681 Unsteadiness on feet: Secondary | ICD-10-CM

## 2024-06-06 DIAGNOSIS — W19XXXA Unspecified fall, initial encounter: Secondary | ICD-10-CM

## 2024-06-06 DIAGNOSIS — M6281 Muscle weakness (generalized): Secondary | ICD-10-CM

## 2024-06-06 NOTE — ED Provider Notes (Signed)
 [place] EMERGENCY DEPARTMENT AT [facility] Provider Note   CSN: [id number] Arrival date & time: 06/06/24  1055     Patient presents with: [name] is a 68 y.o. female.   Patient with history of CHF, diabetes, DVT on Xarelto , hypertension, hyperlipidemia, diabetes presents today with complaints of mechanical fall. Reports same occurred when she was at physical therapy and tripped and fell and landed on her left knee. She also caught herself with her outstretched left arm and has pain in her shoulder. She does not think she hit her head but is not sure. She is on Xarelto . She has been able to walk since with some discomfort.   The history is provided by the patient. No language interpreter was used.  Fall       Prior to Admission medications   Medication Sig Start Date End Date Taking? Authorizing Provider  acetaminophen  (TYLENOL ) 500 MG tablet Take 500 mg by mouth every 6 (six) hours as needed for moderate pain (pain score 4-6).    [provider]  albuterol  (VENTOLIN  HFA) 108 (90 Base) MCG/ACT inhaler Inhale 2 puffs into the lungs every 6 (six) hours as needed for wheezing or shortness of breath. Patient not taking: Reported on 06/01/2024 03/15/24   [name] ORN, MD  Ascorbic Acid  (VITAMIN C PO) Take 1 tablet by mouth daily.    [provider]  budesonide -formoterol  (SYMBICORT ) 160-4.5 MCG/ACT inhaler Inhale 2 puffs into the lungs 2 (two) times daily. Patient not taking: Reported on 06/01/2024 05/16/24   [name] ORN, MD  carvedilol  (COREG ) 6.25 MG tablet TAKE 1 TABLET BY MOUTH TWICE A DAY 11/23/23   McLean, Dalton S, MD  Cholecalciferol (VITAMIN D3 PO) Take 1 capsule by mouth daily.    [provider]  Cyanocobalamin  (B-12 PO) Take 1 capsule by mouth daily.    [provider]  cyclobenzaprine  (FLEXERIL ) 5 MG tablet Take 1 tablet (5 mg total) by mouth at bedtime as needed for muscle spasms. 03/27/24   Patel, Donika K,  DO  desloratadine  (CLARINEX ) 5 MG tablet Take 5 mg by mouth as needed. Patient not taking: Reported on 06/01/2024    [provider]  diclofenac  Sodium (VOLTAREN ) 1 % GEL Apply 2 g topically 4 (four) times daily. Rub into affected area of foot 2 to 4 times daily 02/24/24   Lamount Ethan CROME, DPM  furosemide  (LASIX ) 40 MG tablet Take 1.5 tablets (60 mg total) by mouth daily. 08/17/23   [name] ORN, MD  gabapentin  (NEURONTIN ) 300 MG capsule Take 2 capsules (600 mg total) by mouth at bedtime as needed (pain). 10/29/23   Iruku, Praveena, MD  ketoconazole  (NIZORAL ) 2 % cream Apply 1 Application topically daily. Patient not taking: Reported on 06/01/2024 08/17/23   [name] ORN, MD  Magnesium  Oxide 400 MG CAPS Take 1 capsule (400 mg total) by mouth daily. 09/28/23   Rolan Ezra RAMAN, MD  Multiple Vitamins-Minerals (MULTIVITAMIN WITH MINERALS) tablet Take 1 tablet by mouth daily.    [provider]  nystatin  (MYCOSTATIN /NYSTOP ) powder Apply 1 Application topically 3 (three) times daily. 12/10/23   Iruku, Praveena, MD  potassium chloride  SA (KLOR-CON  M20) 20 MEQ tablet Take 1 tablet (20 mEq total) by mouth 2 (two) times daily. 09/27/23   Rolan Ezra RAMAN, MD  predniSONE  (DELTASONE ) 10 MG tablet 3 tabs by mouth per day for 3 days,2tabs per day for 3 days,1tab per day for 3 days 05/19/24  [name] ORN, MD  telmisartan -hydrochlorothiazide  (MICARDIS  HCT) 80-12.5 MG tablet TAKE 1 TABLET BY MOUTH EVERY DAY 05/15/24   [name] ORN, MD  tirzepatide  (MOUNJARO ) 7.5 MG/0.5ML Pen Inject 7.5 mg into the skin once a week. Patient not taking: Reported on 05/24/2024 04/05/24   Rolan Ezra RAMAN, MD  Wheat Dextrin [facility]) POWD Take by mouth as needed.    [provider]  XARELTO  20 MG TABS tablet TAKE 1 TABLET BY MOUTH DAILY WITH SUPPER 03/23/24   Rolan Ezra RAMAN, MD    Allergies: Morphine and codeine, Codeine, Darvon, Duloxetine , Duloxetine  hcl, Hydrocodone , Oxycodone , Pravastatin , Prevnar 20  [pneumococcal 20-val conj vacc], and Rosuvastatin     Review of Systems  Musculoskeletal:  Positive for arthralgias and myalgias.  All other systems reviewed and are negative.   Updated Vital Signs BP (!) 158/108 (BP Location: Right Arm)   Pulse 62   Temp 98.1 F (36.7 C) (Oral)   Resp 18   LMP 05/18/2016   SpO2 100%   Physical Exam Vitals and nursing note reviewed.  Constitutional:      General: She is not in acute distress.    Appearance: Normal appearance. She is normal weight. She is not ill-appearing, toxic-appearing or diaphoretic.  HENT:     Head: Normocephalic and atraumatic.     Comments: No racoon eyes No battle sign Eyes:     Extraocular Movements: Extraocular movements intact.     Pupils: Pupils are equal, round, and reactive to light.  Cardiovascular:     Rate and Rhythm: Normal rate.     Comments: No tenderness to palpation of the anterior chest wall Pulmonary:     Effort: Pulmonary effort is normal. No respiratory distress.  Abdominal:     Comments: No abdominal tenderness or bruising  Musculoskeletal:        General: Normal range of motion.     Cervical back: Normal and normal range of motion.     Thoracic back: Normal.     Lumbar back: Normal.     Comments: No midline tenderness, no stepoffs or deformity noted on palpation of thoracic, and lumbar spine  Mild generalized TTP noted to the left neck and cervical spine area.  No step-offs, lesions, deformity, or overlying skin changes.  5/5 strength and sensation intact bilateral upper extremities.  Mild TTP of the left shoulder area.  No swelling, bruising, or deformity.  ROM intact with some discomfort.  Radial pulse and distal sensation intact.  Mild generalized TTP of the left anterior knee without obvious swelling, bruising, or deformity.  DP and PT pulses intact and 2+.  Ambulatory.  Skin:    General: Skin is warm and dry.  Neurological:     General: No focal deficit present.     Mental Status: She  is alert and oriented to person, place, and time.  Psychiatric:        Mood and Affect: Mood normal.        Behavior: Behavior normal.     (all labs ordered are listed, but only abnormal results are displayed) Labs Reviewed - No data to display  EKG: None  Radiology: MR Cervical Spine Wo Contrast Result Date: 06/06/2024 CLINICAL DATA:  Neck trauma, ligament injury suspected (Age >= 16y) EXAM: MRI CERVICAL SPINE WITHOUT CONTRAST TECHNIQUE: Multiplanar, multisequence MR imaging of the cervical spine was performed. No intravenous contrast was administered. COMPARISON:  MRI cervical spine 05/18/2021. FINDINGS: Alignment: No substantial sagittal subluxation. Vertebrae: No evidence of acute fracture.  C4-C5  ACDF. Cord: Normal cord signal. Posterior Fossa, vertebral arteries, paraspinal tissues: Negative. No edema or evidence of ligamentous injury. Stable in comparison to prior MRI. Disc levels: C2-C3: No significant disc protrusion, foraminal stenosis, or canal stenosis. C3-C4: Right greater than left facet uncovertebral hypertrophy and small posterior disc osteophyte complex. Mild right foraminal stenosis. Patent canal and left foramen. C4-C5: ACDF.  Patent canal.  Mild bilateral foraminal stenosis. C5-C6: Bilateral facet uncovertebral hypertrophy and posterior disc osteophyte complex. Resulting moderate to severe left and moderate right foraminal stenosis. Mild to moderate canal stenosis. Findings are similar. C6-C7: Posterior disc osteophyte complex without significant stenosis. C7-T1: No significant disc protrusion, foraminal stenosis, or canal stenosis. IMPRESSION: 1. No evidence of ligamentous injury. 2. Similar multilevel degenerative change, detailed above. Electronically Signed   By: Gilmore GORMAN Molt M.D.   On: 06/06/2024 19:22   DG Knee Complete 4 Views Left Result Date: 06/06/2024 CLINICAL DATA:  Fall and trauma to the left knee. EXAM: LEFT KNEE - COMPLETE 4+ VIEW COMPARISON:  None Available.  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 The arthroplasty components appear intact and in anatomic alignment. There is no acute fracture or dislocation. The bones are osteopenic. The soft tissues are unremarkable. IMPRESSION: 1. No acute fracture or dislocation. 2. Total left knee arthroplasty. Electronically Signed   By: Vanetta Chou M.D.   On: 06/06/2024 15:07   DG Shoulder Left Result Date: 06/06/2024 CLINICAL DATA:  Fall and trauma to the left shoulder.  Pain. EXAM: LEFT SHOULDER - 2+ VIEW COMPARISON:  None Available. FINDINGS: No acute fracture or dislocation. Moderate narrowing of the glenohumeral joint. There is spurring of the acromioclavicular joint with narrowing of the acromial humeral distance. The soft tissues are unremarkable. IMPRESSION: 1. No acute fracture or dislocation. 2. Degenerative changes. Electronically Signed   By: Vanetta Chou M.D.   On: 06/06/2024 14:55   CT Cervical Spine Wo Contrast Result Date: 06/06/2024 EXAM: CT HEAD AND CERVICAL SPINE 06/06/2024 02:03:00 PM TECHNIQUE: CT of the head and cervical spine was performed without the administration of intravenous contrast. Multiplanar reformatted images are provided for review. Automated exposure control, iterative reconstruction, and/or weight based adjustment of the mA/kV was utilized to reduce the radiation dose to as low as reasonably achievable. COMPARISON: MRI head 05/18/2024 and MRI cervical spine 05/18/2021. CLINICAL HISTORY: Head trauma, minor (Age >= 65y). Pt BIB EMS from an appt. Pt tripped over her foot and fell onto the floor landing on her wrists and knees. No LOC, pt did not hit her head. FINDINGS: CT HEAD BRAIN AND VENTRICLES: No acute intracranial hemorrhage. No mass effect or midline shift. No abnormal extra-axial fluid collection. Gray-white differentiation is maintained. No hydrocephalus. Nonspecific hypoattenuation in the periventricular and subcortical white matter, most likely representing  chronic small vessel disease. 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 ORBITS: No acute abnormality. SINUSES AND MASTOIDS: No acute abnormality. SOFT TISSUES AND SKULL: No acute skull fracture. No acute soft tissue abnormality. CT CERVICAL SPINE BONES AND ALIGNMENT: No acute fracture or displaced fracture in the cervical spine. 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 Widening of the atlantodense interval measuring up to 3.6 mm which is increased compared to the CT maxillofacial on 08/02/2023. DEGENERATIVE CHANGES: Straining of the normal cervical lordosis. No spondylolisthesis. No facet subluxation or dislocation. Anterior cervical fusion hardware at C4-5, hardware appears intact. 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 There is no high-grade osseous  spinal canal stenosis. SOFT TISSUES: No prevertebral soft tissue swelling. IMPRESSION: 1. No acute intracranial abnormality. 2. Widening of the atlantodens interval measuring up to 3.6 mm, increased compared to the prior CT maxillofacial on 08/02/23. Recommend MRI cervical spine to evaluate for ligamentous injury. 3. No acute fracture of the cervical spine. 4. Chronic small vessel disease. 5. 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 Electronically signed by: Donnice Mania MD 06/06/2024 02:27 PM EDT RP Workstation: [id number]   CT Head Wo Contrast Result Date: 06/06/2024 EXAM: CT HEAD AND CERVICAL SPINE 06/06/2024 02:03:00 PM TECHNIQUE: CT of the head and cervical spine was performed without the administration of intravenous contrast. Multiplanar reformatted images are provided for review. Automated exposure control, iterative reconstruction, and/or weight based adjustment of the mA/kV was utilized to reduce the radiation dose to as low as reasonably achievable. COMPARISON: MRI head 05/18/2024 and MRI cervical spine 05/18/2021. CLINICAL HISTORY: Head  trauma, minor (Age >= 65y). Pt BIB EMS from an appt. Pt tripped over her foot and fell onto the floor landing on her wrists and knees. No LOC, pt did not hit her head. FINDINGS: CT HEAD BRAIN AND VENTRICLES: No acute intracranial hemorrhage. No mass effect or midline shift. No abnormal extra-axial fluid collection. Gray-white differentiation is maintained. No hydrocephalus. Nonspecific hypoattenuation in the periventricular and subcortical white matter, most likely representing chronic small vessel disease. 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 ORBITS: No acute abnormality. SINUSES AND MASTOIDS: No acute abnormality. SOFT TISSUES AND SKULL: No acute skull fracture. No acute soft tissue abnormality. CT CERVICAL SPINE BONES AND ALIGNMENT: No acute fracture or displaced fracture in the cervical spine. 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 Widening of the atlantodense interval measuring up to 3.6 mm which is increased compared to the CT maxillofacial on 08/02/2023. DEGENERATIVE CHANGES: Straining of the normal cervical lordosis. No spondylolisthesis. No facet subluxation or dislocation. Anterior cervical fusion hardware at C4-5, hardware appears intact. 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 There is no high-grade osseous spinal canal stenosis. SOFT TISSUES: No prevertebral soft tissue swelling. IMPRESSION: 1. No acute intracranial abnormality. 2. Widening of the atlantodens interval measuring up to 3.6 mm, increased compared to the prior CT maxillofacial on 08/02/23. Recommend MRI cervical spine to evaluate for ligamentous injury. 3. No acute fracture of the cervical spine. 4. Chronic small vessel disease. 5. 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 Electronically signed by: Donnice Mania MD 06/06/2024 02:26 PM EDT RP Workstation: [id number]     Procedures   Medications Ordered in the ED -  No data to display                                  Medical Decision Making Amount and/or Complexity of Data Reviewed Radiology: ordered.   Patient presents today with complaints of mechanical fall immediately prior to arrival today.  They are afebrile, nontoxic-appearing, and in no acute distress with reassuring vital signs.  Physical exam reveals  No midline tenderness, no stepoffs or deformity noted on palpation of thoracic, and lumbar spine  Mild generalized TTP noted to the left neck and cervical spine area.  No step-offs, lesions, deformity, or overlying skin changes.  5/5 strength and sensation intact bilateral upper extremities.  Mild TTP of the left shoulder area.  No swelling, bruising, or deformity.  ROM intact with some discomfort.  Radial  pulse and distal sensation intact.  Mild generalized TTP of the left anterior knee without obvious swelling, bruising, or deformity.  DP and PT pulses intact and 2+.  Ambulatory. Patient without signs of serious head, neck, or back injury. No midline spinal tenderness or TTP of the chest or abd.  Normal neurological exam. No concern for closed head injury, lung injury, or intraabdominal injury. Given patients complaint of neck pain and fall on Xarelto , CT imaging ordered and obtained of the head and neck as well as x-ray imaging obtained of the left knee and left shoulder which has resulted and reveals  Ct head and c spine: 1. No acute intracranial abnormality. 2. Widening of the atlantodens interval measuring up to 3.6 mm, increased compared to the prior CT maxillofacial on 08/02/23. Recommend MRI cervical spine to evaluate for ligamentous injury. 3. No acute fracture of the cervical spine. 4. Chronic small vessel disease. 5. 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  MRI cervical spine: 1. No evidence of ligamentous injury. 2. Similar multilevel degenerative change, detailed above.  Left shoulder: 1. No acute fracture or  dislocation. 2. Degenerative changes.  Left knee:  1. No acute fracture or dislocation. 2. Total left knee arthroplasty.  I have personally reviewed and interpreted this imaging and agree with radiology interpretation.  Radiology without acute abnormality.  Patient is able to ambulate without difficulty in the ED.  Pt is hemodynamically stable, in NAD.   Pain has been managed & pt has no complaints prior to dc.  Patient counseled on typical course of muscle stiffness and soreness post-fall. Discussed s/s that should cause them to return. Patient instructed on NSAID use. Encouraged PCP follow-up for recheck if symptoms are not improved in one week. Evaluation and diagnostic testing in the emergency department does not suggest an emergent condition requiring admission or immediate intervention beyond what has been performed at this time.  Plan for discharge with close PCP follow-up.  Patient is understanding and amenable with plan, educated on red flag symptoms that would prompt immediate return.  Patient discharged in stable condition.  Final diagnoses:  Fall, initial encounter    ED Discharge Orders     None     An After Visit Summary was printed and given to the patient.      Imad Shostak A, PA-C 06/06/24 2032    Patt Alm Macho, MD 06/07/24 [phone]

## 2024-06-06 NOTE — Discharge Instructions (Signed)
 You sustained a fall and have been diagnosed with muscular injuries as result of this accident.    You will likely experience muscle spasms, muscle aches, and bruising as a result of these injuries.  Ultimately these injuries will take time to heal.  Rest, hydration, gentle exercise and stretching will aid in recovery from his injuries.    Using medication such as Tylenol  and ibuprofen  will help alleviate pain as well as decrease swelling and inflammation associated with these injuries. You may use up to 800 mg ibuprofen  every 6 hours or up to 1000 mg of Tylenol  every 6 hours.  You may choose to alternate between the 2.  This would be most effective.  Do not exceed 4000 mg of Tylenol  within 24 hours.  Do not exceed 3200 mg ibuprofen  within 24 hours.  If your fall was today you will likely feel far more achy and painful tomorrow morning.  This is to be expected.  Salt water /Epson salt soaks, massage, icy hot/Biofreeze/BenGay and other similar products can help with symptoms.  Please return to the emergency department for reevaluation if you denies any new or concerning symptoms.

## 2024-06-06 NOTE — ED Notes (Signed)
 Pt arrives w/ c- collar & PA took off

## 2024-06-06 NOTE — ED Notes (Signed)
 MD in room with patient, interpreter used for assessment by MD.  Daughter in room speaks english and is also there for the MD to communicate with.

## 2024-06-06 NOTE — ED Triage Notes (Signed)
 Pt BIB EMS from an appt. Pt tripped over her foot and fell onto the floor landing on her wrists and knees. No LOC, pt did not hit her head.

## 2024-06-06 NOTE — Therapy (Signed)
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 Baseline: Pt is only doing STS (8/19) Goal status: NOT MET  2.  Pt will increase BERG balance score to >/=47/56 to demonstrate improved static balance. Baseline: 37/56; 47/56 (8/19) Goal status: MET  3.  Pt will ambulate >/=1063 feet on to demonstrate improved functional endurance for home and community participation. Baseline: 863 ft Goal status: INITIAL  4.  Pt will demonstrate a gait speed of >/=3.45 ft/sec feet/sec in order to decrease risk for falls. Baseline: 2.80 ft/sec; 3.25 ft/sec (7/29) Goal status: REVISED  ASSESSMENT:  CLINICAL IMPRESSION: Goal assessment and session limited by mechanical fall.  Pt being discharged due to this and plateau functionally.  She has ongoing medical workup for issues outside PT scope and PT to follow-up regarding recovery for continuity of care.  Pt  understanding of plan.  OBJECTIVE IMPAIRMENTS: Abnormal gait, decreased activity tolerance, decreased balance, decreased endurance,  decreased knowledge of condition, difficulty walking, decreased strength, increased muscle spasms, impaired sensation, improper body mechanics, and postural dysfunction.   ACTIVITY LIMITATIONS: carrying, lifting, bending, standing, squatting, stairs, transfers, reach over head, and locomotion level  PARTICIPATION LIMITATIONS: shopping and community activity  PERSONAL FACTORS: Age, Fitness, Past/current experiences, Time since onset of injury/illness/exacerbation, and 3+ comorbidities: DM2, HTN, and prior orthopedic surgeries to BLE are also affecting patient's functional outcome.   REHAB POTENTIAL: Good  CLINICAL DECISION MAKING: Evolving/moderate complexity  EVALUATION COMPLEXITY: Moderate  PLAN:  PT FREQUENCY: 1x/week  PT DURATION: 8 weeks  PLANNED INTERVENTIONS: 97164- PT Re-evaluation, 97750- Physical Performance Testing, 97110-Therapeutic exercises, 97530- Therapeutic activity, 97112- Neuromuscular re-education, 97535- Self Care, [id number]- Manual therapy, [phone]- Gait training, [phone]- Aquatic Therapy, [phone]- Electrical stimulation (manual), Patient/Family education, Balance training, Stair training, Taping, Joint mobilization, Vestibular training, and DME instructions  PLAN FOR NEXT SESSION: N/A    Daved KATHEE Bull, PT, DPT 06/06/2024, 10:54 AM

## 2024-06-06 NOTE — ED Notes (Signed)
 D/t mechanism of injury, Aspen C collar placed on pt until cleared by CT/MRI

## 2024-06-08 ENCOUNTER — Telehealth: Payer: Self-pay | Admitting: Physical Therapy

## 2024-06-08 NOTE — Telephone Encounter (Signed)
 PT called pt and LVM attempting to follow-up regarding status after fall.  Instructed pt to call clinic back if she needed to get in touch with therapist or had questions or concerns.  Daved Bull, PT, DPT

## 2024-06-12 LAB — OPHTHALMOLOGY REPORT-SCANNED

## 2024-06-15 ENCOUNTER — Ambulatory Visit: Payer: Medicare Other

## 2024-06-20 ENCOUNTER — Ambulatory Visit: Payer: Self-pay

## 2024-06-20 NOTE — Telephone Encounter (Signed)
 FYI Only or Action Required?: FYI only for provider.  Patient was last seen in primary care on 05/19/2024 by [name] ORN, MD.  Called Nurse Triage reporting Cough.  Symptoms began several days ago.  Interventions attempted: Prescription medications: Promethazine  cough syrup.  Symptoms are: gradually worsening.  Triage Disposition: See Physician Within 24 Hours  Patient/caregiver understands and will follow disposition?: Yes        Copied from CRM #[id number]. Topic: Clinical - Red Word Triage >> Jun 20, 2024  9:53 AM Mesmerise C wrote: [facility] that prompted transfer to Nurse Triage: Patient stated she's having chest congestion, sore throat, yellow mucus along with wheezing notices more when she's laying down started over the weekend, states she doesn't know if it's covid would like to be tested, took Desloradine and promethazine  there was improvement but the were old medications but still experiencing congestion and coughing Reason for Disposition  [1] Continuous (nonstop) coughing interferes with work or school AND [2] no improvement using cough treatment per Care Advice  Answer Assessment - Initial Assessment Questions Cough with wheezing started with a sore throat on Monday. Patient taking Promethazine  cough syrup for symptoms. Patient states the cough has improved but is requesting a Covid test due to symptoms she is having.    1. ONSET: When did the cough begin?     Sunday  2. SEVERITY: How bad is the cough today?      Mild today was worst before taking promethazine   3. SPUTUM: Describe the color of your sputum (e.g., none, dry cough; clear, white, yellow, green)     Yellow  4. HEMOPTYSIS: Are you coughing up any blood? If Yes, ask: How much? (e.g., flecks, streaks, tablespoons, etc.)     No  5. DIFFICULTY BREATHING: Are you having difficulty breathing? If Yes, ask: How bad is it? (e.g., mild, moderate, severe)      Denies  6. FEVER: Do you have a fever? If  Yes, ask: What is your temperature, how was it measured, and when did it start?     Denies  7. CARDIAC HISTORY: Do you have any history of heart disease? (e.g., heart attack, congestive heart failure)      Patient states she sees a cardiologist due to being immunocompromised.  8. LUNG HISTORY: Do you have any history of lung disease?  (e.g., pulmonary embolus, asthma, emphysema)     Has had wheezing prior had to have lung testing done and states she has not gotten results of these tests.  9. PE RISK FACTORS: Do you have a history of blood clots? (or: recent major surgery, recent prolonged travel, bedridden)     States she has a hx of blood clots and is on Xarelto .  10. OTHER SYMPTOMS: Do you have any other symptoms? (e.g., runny nose, wheezing, chest pain)       Wheezing when laying down and some congestion.  Protocols used: Cough - Acute Productive-A-AH

## 2024-06-21 ENCOUNTER — Ambulatory Visit

## 2024-06-21 ENCOUNTER — Encounter: Payer: Self-pay | Admitting: Internal Medicine

## 2024-06-21 ENCOUNTER — Ambulatory Visit (INDEPENDENT_AMBULATORY_CARE_PROVIDER_SITE_OTHER): Admitting: Internal Medicine

## 2024-06-21 VITALS — BP 138/102 | HR 71 | Temp 98.2°F | Ht 59.0 in | Wt 245.2 lb

## 2024-06-21 DIAGNOSIS — R051 Acute cough: Secondary | ICD-10-CM

## 2024-06-21 DIAGNOSIS — E559 Vitamin D deficiency, unspecified: Secondary | ICD-10-CM

## 2024-06-21 DIAGNOSIS — I10 Essential (primary) hypertension: Secondary | ICD-10-CM

## 2024-06-21 DIAGNOSIS — E119 Type 2 diabetes mellitus without complications: Secondary | ICD-10-CM | POA: Diagnosis not present

## 2024-06-21 DIAGNOSIS — R062 Wheezing: Secondary | ICD-10-CM

## 2024-06-21 LAB — POC COVID19 BINAXNOW: SARS Coronavirus 2 Ag: NEGATIVE

## 2024-06-21 MED ORDER — BUDESONIDE-FORMOTEROL FUMARATE 160-4.5 MCG/ACT IN AERO: 2.0000 | INHALATION_SPRAY | Freq: Two times a day (BID) | RESPIRATORY_TRACT | 5 refills | Status: DC

## 2024-06-21 MED ORDER — HYDROCODONE BIT-HOMATROP MBR 5-1.5 MG/5ML PO SOLN: 5.0000 mL | Freq: Four times a day (QID) | ORAL | 0 refills | Status: AC | PRN

## 2024-06-21 MED ORDER — ALBUTEROL SULFATE HFA 108 (90 BASE) MCG/ACT IN AERS: 2.0000 | INHALATION_SPRAY | Freq: Four times a day (QID) | RESPIRATORY_TRACT | 2 refills | Status: AC | PRN

## 2024-06-21 MED ORDER — AZITHROMYCIN 250 MG PO TABS: ORAL_TABLET | ORAL | 1 refills | Status: AC

## 2024-06-21 MED ORDER — PREDNISONE 10 MG PO TABS: ORAL_TABLET | ORAL | 0 refills | Status: DC
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
## 2024-06-21 NOTE — Patient Instructions (Signed)
 Your COVID and Flu testing is negative today  Please take all new medication as prescribed - the antibiotic, cough medicine and prednisone   Please continue all other medications as before, and refills have been done for the albuterol  HFA quick onset inhaler, and restarting the Symbicort    Please have the pharmacy call with any other refills you may need.  Please keep your appointments with your specialists as you may have planned  Please go to the XRAY Department in the first floor for the x-ray testing  You will be contacted by phone if any changes need to be made immediately.  Otherwise, you will receive a letter about your results with an explanation, but please check with MyChart first.

## 2024-06-21 NOTE — Progress Notes (Unsigned)
 Patient ID: KAIDA GAMES, female   DOB: March 30, 1956, 68 y.o.   MRN: [id number]

## 2024-06-22 ENCOUNTER — Other Ambulatory Visit: Payer: Self-pay

## 2024-06-22 ENCOUNTER — Other Ambulatory Visit

## 2024-06-22 ENCOUNTER — Ambulatory Visit

## 2024-06-22 DIAGNOSIS — C50112 Malignant neoplasm of central portion of left female breast: Secondary | ICD-10-CM

## 2024-06-23 ENCOUNTER — Encounter: Payer: Self-pay | Admitting: Hematology and Oncology

## 2024-06-23 ENCOUNTER — Encounter: Payer: Self-pay | Admitting: Internal Medicine

## 2024-06-23 ENCOUNTER — Inpatient Hospital Stay

## 2024-06-23 ENCOUNTER — Inpatient Hospital Stay: Attending: Hematology and Oncology

## 2024-06-23 VITALS — BP 126/89 | HR 73 | Temp 97.9°F | Resp 18

## 2024-06-23 DIAGNOSIS — Z5112 Encounter for antineoplastic immunotherapy: Secondary | ICD-10-CM | POA: Insufficient documentation

## 2024-06-23 DIAGNOSIS — Z1722 Progesterone receptor negative status: Secondary | ICD-10-CM | POA: Insufficient documentation

## 2024-06-23 DIAGNOSIS — I110 Hypertensive heart disease with heart failure: Secondary | ICD-10-CM | POA: Diagnosis not present

## 2024-06-23 DIAGNOSIS — I509 Heart failure, unspecified: Secondary | ICD-10-CM | POA: Diagnosis not present

## 2024-06-23 DIAGNOSIS — Z801 Family history of malignant neoplasm of trachea, bronchus and lung: Secondary | ICD-10-CM | POA: Diagnosis not present

## 2024-06-23 DIAGNOSIS — Z170 Estrogen receptor positive status [ER+]: Secondary | ICD-10-CM | POA: Insufficient documentation

## 2024-06-23 DIAGNOSIS — C773 Secondary and unspecified malignant neoplasm of axilla and upper limb lymph nodes: Secondary | ICD-10-CM | POA: Insufficient documentation

## 2024-06-23 DIAGNOSIS — Z8041 Family history of malignant neoplasm of ovary: Secondary | ICD-10-CM | POA: Insufficient documentation

## 2024-06-23 DIAGNOSIS — Z923 Personal history of irradiation: Secondary | ICD-10-CM | POA: Diagnosis not present

## 2024-06-23 DIAGNOSIS — Z171 Estrogen receptor negative status [ER-]: Secondary | ICD-10-CM

## 2024-06-23 DIAGNOSIS — Z803 Family history of malignant neoplasm of breast: Secondary | ICD-10-CM | POA: Diagnosis not present

## 2024-06-23 DIAGNOSIS — C50812 Malignant neoplasm of overlapping sites of left female breast: Secondary | ICD-10-CM | POA: Insufficient documentation

## 2024-06-23 DIAGNOSIS — Z8542 Personal history of malignant neoplasm of other parts of uterus: Secondary | ICD-10-CM | POA: Insufficient documentation

## 2024-06-23 DIAGNOSIS — Z808 Family history of malignant neoplasm of other organs or systems: Secondary | ICD-10-CM | POA: Insufficient documentation

## 2024-06-23 LAB — CBC WITH DIFFERENTIAL (CANCER CENTER ONLY)
Abs Immature Granulocytes: 0.08 K/uL — ABNORMAL HIGH (ref 0.00–0.07)
Basophils Absolute: 0 K/uL (ref 0.0–0.1)
Basophils Relative: 0 %
Eosinophils Absolute: 0 K/uL (ref 0.0–0.5)
Eosinophils Relative: 0 %
HCT: 30.7 % — ABNORMAL LOW (ref 36.0–46.0)
Hemoglobin: 10 g/dL — ABNORMAL LOW (ref 12.0–15.0)
Immature Granulocytes: 1 %
Lymphocytes Relative: 16 %
Lymphs Abs: 1.3 K/uL (ref 0.7–4.0)
MCH: 29.1 pg (ref 26.0–34.0)
MCHC: 32.6 g/dL (ref 30.0–36.0)
MCV: 89.2 fL (ref 80.0–100.0)
Monocytes Absolute: 0.4 K/uL (ref 0.1–1.0)
Monocytes Relative: 5 %
Neutro Abs: 6.2 K/uL (ref 1.7–7.7)
Neutrophils Relative %: 78 %
Platelet Count: 233 K/uL (ref 150–400)
RBC: 3.44 MIL/uL — ABNORMAL LOW (ref 3.87–5.11)
RDW: 14.2 % (ref 11.5–15.5)
WBC Count: 8 K/uL (ref 4.0–10.5)
nRBC: 0 % (ref 0.0–0.2)

## 2024-06-23 LAB — CMP (CANCER CENTER ONLY)
ALT: 13 U/L (ref 0–44)
AST: 16 U/L (ref 15–41)
Albumin: 3.9 g/dL (ref 3.5–5.0)
Alkaline Phosphatase: 42 U/L (ref 38–126)
Anion gap: 6 (ref 5–15)
BUN: 34 mg/dL — ABNORMAL HIGH (ref 8–23)
CO2: 29 mmol/L (ref 22–32)
Calcium: 9.3 mg/dL (ref 8.9–10.3)
Chloride: 104 mmol/L (ref 98–111)
Creatinine: 1.06 mg/dL — ABNORMAL HIGH (ref 0.44–1.00)
GFR, Estimated: 58 mL/min — ABNORMAL LOW (ref >60)
Glucose, Bld: 202 mg/dL — ABNORMAL HIGH (ref 70–99)
Potassium: 3.7 mmol/L (ref 3.5–5.1)
Sodium: 139 mmol/L (ref 135–145)
Total Bilirubin: 0.3 mg/dL (ref 0.0–1.2)
Total Protein: 8 g/dL (ref 6.5–8.1)

## 2024-06-23 MED ORDER — DIPHENHYDRAMINE HCL 25 MG PO CAPS
25.0000 mg | ORAL_CAPSULE | Freq: Once | ORAL | Status: AC
  Administered 2024-06-23: 25 mg via ORAL
  Filled 2024-06-23: qty 1

## 2024-06-23 MED ORDER — SODIUM CHLORIDE 0.9 % IV SOLN: Freq: Once | INTRAVENOUS | Status: AC

## 2024-06-23 MED ORDER — TRASTUZUMAB-ANNS CHEMO 150 MG IV SOLR
6.0000 mg/kg | Freq: Once | INTRAVENOUS | Status: AC
  Administered 2024-06-23: 600 mg via INTRAVENOUS
  Filled 2024-06-23: qty 28.57

## 2024-06-23 MED ORDER — ACETAMINOPHEN 325 MG PO TABS
650.0000 mg | ORAL_TABLET | Freq: Once | ORAL | Status: AC
  Administered 2024-06-23: 650 mg via ORAL
  Filled 2024-06-23: qty 2

## 2024-06-23 NOTE — Assessment & Plan Note (Signed)
 Last vitamin D Lab Results  Component Value Date   VD25OH 37.66 07/10/2022   Low, to start oral replacement

## 2024-06-23 NOTE — Assessment & Plan Note (Signed)
  Uncontrolled today, 138/102, likely reactive, pt to continue medical treatment coreg  6.25 bid, micardis  hct 80 12.5 mg qd

## 2024-06-23 NOTE — Assessment & Plan Note (Signed)
 Mild to mod, for prednisone  taper, albuterol  hfa prn, and restart symbicort  asd,  to f/u any worsening symptoms or concerns

## 2024-06-23 NOTE — Assessment & Plan Note (Addendum)
 Mild to mod, c/w bronchitis vs pna, for cxr, and antibx course zpack, hycodan prn,  to f/u any worsening symptoms or concerns

## 2024-06-23 NOTE — Patient Instructions (Signed)
 [facility] - A DEPT OF [facility]  Discharge Instructions: Thank you for choosing [facility] to provide your oncology and hematology care.   If you have a lab appointment with the Cancer Center, please go directly to the Cancer Center and check in at the registration area.   Wear comfortable clothing and clothing appropriate for easy access to any Portacath or PICC line.   We strive to give you quality time with your provider. You may need to reschedule your appointment if you arrive late (15 or more minutes).  Arriving late affects you and other patients whose appointments are after yours.  Also, if you miss three or more appointments without notifying the office, you may be dismissed from the clinic at the provider's discretion.      For prescription refill requests, have your pharmacy contact our office and allow 72 hours for refills to be completed.    Today you received the following chemotherapy and/or immunotherapy agents: trastuzumab -anns (KANJINTI )     To help prevent nausea and vomiting after your treatment, we encourage you to take your nausea medication as directed.  BELOW ARE SYMPTOMS THAT SHOULD BE REPORTED IMMEDIATELY: *FEVER GREATER THAN 100.4 F (38 C) OR HIGHER *CHILLS OR SWEATING *NAUSEA AND VOMITING THAT IS NOT CONTROLLED WITH YOUR NAUSEA MEDICATION *UNUSUAL SHORTNESS OF BREATH *UNUSUAL BRUISING OR BLEEDING *URINARY PROBLEMS (pain or burning when urinating, or frequent urination) *BOWEL PROBLEMS (unusual diarrhea, constipation, pain near the anus) TENDERNESS IN MOUTH AND THROAT WITH OR WITHOUT PRESENCE OF ULCERS (sore throat, sores in mouth, or a toothache) UNUSUAL RASH, SWELLING OR PAIN  UNUSUAL VAGINAL DISCHARGE OR ITCHING   Items with * indicate a potential emergency and should be followed up as soon as possible or go to the Emergency Department if any problems should occur.  Please show the CHEMOTHERAPY ALERT CARD  or IMMUNOTHERAPY ALERT CARD at check-in to the Emergency Department and triage nurse.  Should you have questions after your visit or need to cancel or reschedule your appointment, please contact [facility] - A DEPT OF [facility]  Dept: [phone]  and follow the prompts.  Office hours are 8:00 a.m. to 4:30 p.m. Monday - Friday. Please note that voicemails left after 4:00 p.m. may not be returned until the following business day.  We are closed weekends and major holidays. You have access to a nurse at all times for urgent questions. Please call the main number to the clinic Dept: [phone] and follow the prompts.   For any non-urgent questions, you may also contact your provider using MyChart. We now offer e-Visits for anyone 61 and older to request care online for non-urgent symptoms. For details visit [email or web address].   Also download the MyChart app! Go to the app store, search MyChart, open the app, select [place], and log in with your MyChart username and password.

## 2024-06-23 NOTE — Assessment & Plan Note (Signed)
 Lab Results  Component Value Date   HGBA1C 6.0 (A) 05/16/2024   Stable, pt to continue current medical treatment  - diet, wt control

## 2024-06-28 ENCOUNTER — Telehealth: Payer: Self-pay

## 2024-06-28 NOTE — Telephone Encounter (Signed)
 Copied from CRM [id number]. Topic: Clinical - Lab/Test Results >> Jun 28, 2024 11:51 AM Carlyon D wrote: Reason for CRM: Pt calling in regards to chest xray, she would like a call back to discuss.

## 2024-06-29 ENCOUNTER — Ambulatory Visit: Payer: Self-pay | Admitting: Internal Medicine

## 2024-07-06 NOTE — Telephone Encounter (Signed)
 Patient has since viewed results in MyChart with no further questions

## 2024-07-07 ENCOUNTER — Encounter: Payer: Self-pay | Admitting: Podiatry

## 2024-07-07 ENCOUNTER — Ambulatory Visit (INDEPENDENT_AMBULATORY_CARE_PROVIDER_SITE_OTHER): Admitting: Podiatry

## 2024-07-07 DIAGNOSIS — M79675 Pain in left toe(s): Secondary | ICD-10-CM

## 2024-07-07 DIAGNOSIS — M76821 Posterior tibial tendinitis, right leg: Secondary | ICD-10-CM | POA: Diagnosis not present

## 2024-07-07 DIAGNOSIS — M79674 Pain in right toe(s): Secondary | ICD-10-CM

## 2024-07-07 DIAGNOSIS — E1142 Type 2 diabetes mellitus with diabetic polyneuropathy: Secondary | ICD-10-CM | POA: Diagnosis not present

## 2024-07-07 DIAGNOSIS — B351 Tinea unguium: Secondary | ICD-10-CM | POA: Diagnosis not present
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
## 2024-07-08 ENCOUNTER — Other Ambulatory Visit: Payer: Self-pay

## 2024-07-11 ENCOUNTER — Other Ambulatory Visit: Payer: Self-pay

## 2024-07-11 DIAGNOSIS — C50112 Malignant neoplasm of central portion of left female breast: Secondary | ICD-10-CM

## 2024-07-12 ENCOUNTER — Encounter: Payer: Self-pay | Admitting: Hematology and Oncology

## 2024-07-12 ENCOUNTER — Inpatient Hospital Stay

## 2024-07-12 ENCOUNTER — Inpatient Hospital Stay (HOSPITAL_BASED_OUTPATIENT_CLINIC_OR_DEPARTMENT_OTHER): Admitting: Hematology and Oncology

## 2024-07-12 VITALS — BP 138/86 | HR 73 | Temp 97.6°F | Resp 14 | Wt 244.0 lb

## 2024-07-12 DIAGNOSIS — C50812 Malignant neoplasm of overlapping sites of left female breast: Secondary | ICD-10-CM

## 2024-07-12 DIAGNOSIS — C50112 Malignant neoplasm of central portion of left female breast: Secondary | ICD-10-CM | POA: Diagnosis not present

## 2024-07-12 DIAGNOSIS — Z171 Estrogen receptor negative status [ER-]: Secondary | ICD-10-CM

## 2024-07-12 DIAGNOSIS — Z5112 Encounter for antineoplastic immunotherapy: Secondary | ICD-10-CM | POA: Diagnosis not present

## 2024-07-12 LAB — CMP (CANCER CENTER ONLY)
ALT: 15 U/L (ref 0–44)
AST: 16 U/L (ref 15–41)
Albumin: 3.8 g/dL (ref 3.5–5.0)
Alkaline Phosphatase: 48 U/L (ref 38–126)
Anion gap: 6 (ref 5–15)
BUN: 30 mg/dL — ABNORMAL HIGH (ref 8–23)
CO2: 29 mmol/L (ref 22–32)
Calcium: 9.3 mg/dL (ref 8.9–10.3)
Chloride: 103 mmol/L (ref 98–111)
Creatinine: 1.13 mg/dL — ABNORMAL HIGH (ref 0.44–1.00)
GFR, Estimated: 54 mL/min — ABNORMAL LOW (ref >60)
Glucose, Bld: 161 mg/dL — ABNORMAL HIGH (ref 70–99)
Potassium: 3.8 mmol/L (ref 3.5–5.1)
Sodium: 138 mmol/L (ref 135–145)
Total Bilirubin: 0.4 mg/dL (ref 0.0–1.2)
Total Protein: 7.8 g/dL (ref 6.5–8.1)

## 2024-07-12 LAB — CBC WITH DIFFERENTIAL (CANCER CENTER ONLY)
Abs Immature Granulocytes: 0.02 K/uL (ref 0.00–0.07)
Basophils Absolute: 0 K/uL (ref 0.0–0.1)
Basophils Relative: 0 %
Eosinophils Absolute: 0.2 K/uL (ref 0.0–0.5)
Eosinophils Relative: 3 %
HCT: 31.9 % — ABNORMAL LOW (ref 36.0–46.0)
Hemoglobin: 10.1 g/dL — ABNORMAL LOW (ref 12.0–15.0)
Immature Granulocytes: 0 %
Lymphocytes Relative: 27 %
Lymphs Abs: 1.4 K/uL (ref 0.7–4.0)
MCH: 28.3 pg (ref 26.0–34.0)
MCHC: 31.7 g/dL (ref 30.0–36.0)
MCV: 89.4 fL (ref 80.0–100.0)
Monocytes Absolute: 0.5 K/uL (ref 0.1–1.0)
Monocytes Relative: 11 %
Neutro Abs: 2.9 K/uL (ref 1.7–7.7)
Neutrophils Relative %: 59 %
Platelet Count: 188 K/uL (ref 150–400)
RBC: 3.57 MIL/uL — ABNORMAL LOW (ref 3.87–5.11)
RDW: 14.3 % (ref 11.5–15.5)
WBC Count: 5 K/uL (ref 4.0–10.5)
nRBC: 0 % (ref 0.0–0.2)

## 2024-07-12 MED ORDER — SODIUM CHLORIDE 0.9 % IV SOLN: Freq: Once | INTRAVENOUS | Status: AC

## 2024-07-12 MED ORDER — ACETAMINOPHEN 325 MG PO TABS
650.0000 mg | ORAL_TABLET | Freq: Once | ORAL | Status: AC
  Administered 2024-07-12: 650 mg via ORAL
  Filled 2024-07-12: qty 2

## 2024-07-12 MED ORDER — TRASTUZUMAB-ANNS CHEMO 150 MG IV SOLR
6.0000 mg/kg | Freq: Once | INTRAVENOUS | Status: AC
  Administered 2024-07-12: 600 mg via INTRAVENOUS
  Filled 2024-07-12: qty 28.57

## 2024-07-12 MED ORDER — DIPHENHYDRAMINE HCL 25 MG PO CAPS
25.0000 mg | ORAL_CAPSULE | Freq: Once | ORAL | Status: AC
  Administered 2024-07-12: 25 mg via ORAL
  Filled 2024-07-12: qty 1

## 2024-07-12 NOTE — Patient Instructions (Signed)
 [facility]  Discharge Instructions: Thank you for choosing [facility] to provide your oncology and hematology care.   If you have a lab appointment with the Cancer Center, please go directly to the Cancer Center and check in at the registration area.   Wear comfortable clothing and clothing appropriate for easy access to any Portacath or PICC line.   We strive to give you quality time with your provider. You may need to reschedule your appointment if you arrive late (15 or more minutes).  Arriving late affects you and other patients whose appointments are after yours.  Also, if you miss three or more appointments without notifying the office, you may be dismissed from the clinic at the provider's discretion.      For prescription refill requests, have your pharmacy contact our office and allow 72 hours for refills to be completed.    Today you received the following chemotherapy and/or immunotherapy agents: Trastuzumab      To help prevent nausea and vomiting after your treatment, we encourage you to take your nausea medication as directed.  BELOW ARE SYMPTOMS THAT SHOULD BE REPORTED IMMEDIATELY: *FEVER GREATER THAN 100.4 F (38 C) OR HIGHER *CHILLS OR SWEATING *NAUSEA AND VOMITING THAT IS NOT CONTROLLED WITH YOUR NAUSEA MEDICATION *UNUSUAL SHORTNESS OF BREATH *UNUSUAL BRUISING OR BLEEDING *URINARY PROBLEMS (pain or burning when urinating, or frequent urination) *BOWEL PROBLEMS (unusual diarrhea, constipation, pain near the anus) TENDERNESS IN MOUTH AND THROAT WITH OR WITHOUT PRESENCE OF ULCERS (sore throat, sores in mouth, or a toothache) UNUSUAL RASH, SWELLING OR PAIN  UNUSUAL VAGINAL DISCHARGE OR ITCHING   Items with * indicate a potential emergency and should be followed up as soon as possible or go to the Emergency Department if any problems should occur.  Please show the CHEMOTHERAPY ALERT CARD or  IMMUNOTHERAPY ALERT CARD at check-in to the Emergency Department and triage nurse.  Should you have questions after your visit or need to cancel or reschedule your appointment, please contact [facility]  Dept: [phone]  and follow the prompts.  Office hours are 8:00 a.m. to 4:30 p.m. Monday - Friday. Please note that voicemails left after 4:00 p.m. may not be returned until the following business day.  We are closed weekends and major holidays. You have access to a nurse at all times for urgent questions. Please call the main number to the clinic Dept: [phone] and follow the prompts.   For any non-urgent questions, you may also contact your provider using MyChart. We now offer e-Visits for anyone 62 and older to request care online for non-urgent symptoms. For details visit [email or web address].   Also download the MyChart app! Go to the app store, search "MyChart", open the app, select [place], and log in with your MyChart username and password.

## 2024-07-12 NOTE — Progress Notes (Signed)
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 More than 4 times per year    Active Member of [employer] or Organizations: Yes    Attends Engineer, structural: More than 4 times per year    Marital Status: Divorced  Intimate Partner Violence: Not At Risk (06/15/2023)   Humiliation, Afraid, Rape, and Kick questionnaire    Fear of Current or Ex-Partner: No    Emotionally Abused: No    Physically Abused: No    Sexually Abused: No    FAMILY HISTORY: Family History  Problem Relation Age of Onset   Diabetes Mother    Hypertension Mother    Ovarian cancer Mother    Heart disease Father        COPD   Alcohol abuse Father        ETOH dependence   Breast cancer Maternal Aunt        dx in her 98s   Lung cancer Maternal Uncle    Breast cancer Paternal Aunt    Cancer Maternal Grandmother        salivary gland cancer   Colon cancer Neg Hx    Colon polyps Neg Hx    Esophageal cancer Neg Hx    Stomach cancer Neg Hx    Rectal cancer Neg Hx     Review of Systems  Constitutional:   Negative for appetite change, chills, fatigue, fever and unexpected weight change.  HENT:   Negative for hearing loss, lump/mass and trouble swallowing.   Eyes:  Negative for eye problems and icterus.  Respiratory:  Negative for chest tightness, cough and shortness of breath.   Cardiovascular:  Negative for chest pain, leg swelling and palpitations.  Gastrointestinal:  Negative for abdominal distention, abdominal pain, constipation, diarrhea, nausea and vomiting.  Endocrine: Negative for hot flashes.  Genitourinary:  Negative for difficulty urinating.   Musculoskeletal:  Negative for arthralgias.  Skin:  Negative for itching and rash.  Neurological:  Negative for dizziness, extremity weakness, headaches and numbness.  Hematological:  Negative for adenopathy. Does not bruise/bleed easily.  Psychiatric/Behavioral:  Negative for depression. The patient is not nervous/anxious.       PHYSICAL EXAMINATION   Onc Performance Status - 07/12/24 1300       KPS SCALE   KPS % SCORE Normal activity with effort, some s/s of disease  BP 138/86 (BP Location: Right Arm, Patient Position: Sitting, Cuff Size: Large)   Pulse 73   Temp 97.6 F (36.4 C) (Temporal)   Resp 14   Wt 244 lb (110.7 kg)   LMP 05/18/2016   SpO2 100%   BMI 49.28 kg/m   Physical Exam Constitutional:      General: She is not in acute distress.    Appearance: Normal appearance. She is not toxic-appearing.  HENT:     Head: Normocephalic and atraumatic.     Mouth/Throat:     Mouth: Mucous membranes are moist.     Pharynx: Oropharynx is clear. No oropharyngeal exudate or posterior oropharyngeal erythema.  Eyes:     General: No scleral icterus. Cardiovascular:     Rate and Rhythm: Normal rate and regular rhythm.     Pulses: Normal pulses.     Heart sounds: Normal heart sounds.  Pulmonary:     Effort: Pulmonary effort is normal.     Breath sounds: No wheezing.  Abdominal:     General: Abdomen is flat. Bowel  sounds are normal. There is no distension.     Palpations: Abdomen is soft.     Tenderness: There is no abdominal tenderness.  Musculoskeletal:        General: Swelling (BLE noted) present.     Cervical back: Neck supple.  Lymphadenopathy:     Cervical: No cervical adenopathy.  Skin:    General: Skin is warm and dry.     Findings: No rash.  Neurological:     General: No focal deficit present.     Mental Status: She is alert.     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        Behavior: Behavior normal.     LABORATORY DATA:  CBC    Component Value Date/Time   WBC 8.0 06/23/2024 1320   WBC 6.4 09/27/2020 1114   RBC 3.44 (L) 06/23/2024 1320   HGB 10.0 (L) 06/23/2024 1320   HGB 11.9 03/30/2017 1044   HCT 30.7 (L) 06/23/2024 1320   HCT 37.4 03/30/2017 1044   PLT 233 06/23/2024 1320   PLT 238 03/30/2017 1044   MCV 89.2 06/23/2024 1320   MCV 85.0 03/30/2017 1044   MCH 29.1 06/23/2024 1320   MCHC 32.6 06/23/2024 1320   RDW 14.2 06/23/2024 1320   RDW 15.1 (Young) 03/30/2017 1044   LYMPHSABS 1.3 06/23/2024 1320   LYMPHSABS 1.3 03/30/2017 1044   MONOABS 0.4 06/23/2024 1320   MONOABS 0.5 03/30/2017 1044   EOSABS 0.0 06/23/2024 1320   EOSABS 0.0 03/30/2017 1044   BASOSABS 0.0 06/23/2024 1320   BASOSABS 0.0 03/30/2017 1044    CMP     Component Value Date/Time   NA 139 06/23/2024 1320   NA 143 03/30/2017 1044   K 3.7 06/23/2024 1320   K 3.8 03/30/2017 1044   CL 104 06/23/2024 1320   CL 106 01/18/2013 0909   CO2 29 06/23/2024 1320   CO2 27 03/30/2017 1044   GLUCOSE 202 (Young) 06/23/2024 1320   GLUCOSE 91 03/30/2017 1044   GLUCOSE 99 01/18/2013 0909   BUN 34 (Young) 06/23/2024 1320   BUN 9.7 03/30/2017 1044   CREATININE 1.06 (Young) 06/23/2024 1320   CREATININE 0.8 03/30/2017 1044   CALCIUM  9.3 06/23/2024 1320   CALCIUM  9.7 03/30/2017 1044   PROT 8.0 06/23/2024 1320   PROT  8.0  03/30/2017 1044   ALBUMIN 3.9 06/23/2024 1320   ALBUMIN 3.8 01/27/2021 1232   ALBUMIN 3.4 (L) 03/30/2017 1044   AST 16 06/23/2024 1320   AST 17 03/30/2017 1044   ALT 13 06/23/2024 1320   ALT 15 03/30/2017 1044   ALKPHOS 42 06/23/2024 1320   ALKPHOS 60 03/30/2017 1044   BILITOT 0.3 06/23/2024 1320   BILITOT 0.34 03/30/2017 1044   GFRNONAA 58 (L) 06/23/2024 1320   GFRAA >60 07/11/2020 0923    ASSESSMENT and THERAPY PLAN:   Assessment and Plan Assessment & Plan Metastatic Breast cancer on maintenance herceptin  therapy Tolerating Herceptin  well.  - Continue Herceptin  therapy. - No active cancer on PET scan. Benign lymph nodes. Current therapy effective and well-tolerated. - Continue current cancer therapy until progression  Acute bronchitis Recent upper respiratory infection treated. Chest x-ray unremarkable.  - Await pulmonologist's interpretation of pulmonary function test results.  Type 2 diabetes mellitus and obesity A1c increased to 7.0. Mounjaro  discontinued due to stomach pains. Plan to start Ozempic for weight loss and diabetes control. - Start Ozempic as prescribed by endocrinologist. - Monitor dietary intake for weight management.  All questions were answered. The patient knows to call the clinic with any problems, questions or concerns. We can certainly see the patient much sooner if necessary.  Total encounter time:30 minutes*in face-to-face visit time, chart review, lab review, care coordination, order entry, and documentation of the encounter time.   *Total Encounter Time as defined by the Centers for Medicare and Medicaid Services includes, in addition to the face-to-face time of a patient visit (documented in the note above) non-face-to-face time: obtaining and reviewing outside history, ordering and reviewing medications, tests or procedures, care coordination (communications with other health care professionals or caregivers) and documentation in the medical  record.

## 2024-07-18 ENCOUNTER — Encounter (HOSPITAL_BASED_OUTPATIENT_CLINIC_OR_DEPARTMENT_OTHER): Payer: Self-pay | Admitting: Pulmonary Disease

## 2024-07-18 ENCOUNTER — Ambulatory Visit (HOSPITAL_BASED_OUTPATIENT_CLINIC_OR_DEPARTMENT_OTHER): Admitting: Pulmonary Disease

## 2024-07-18 VITALS — BP 138/84 | HR 71 | Ht 59.0 in | Wt 242.7 lb

## 2024-07-18 DIAGNOSIS — J069 Acute upper respiratory infection, unspecified: Secondary | ICD-10-CM
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
## 2024-07-18 NOTE — Patient Instructions (Addendum)
 URI - resolved --No indications for bronchodilators --OK to consider inhalers for future illness  Moderate restrictive defect due to body habitus --Consider repeating breathing tests if respiratory symptoms worsen

## 2024-08-02 ENCOUNTER — Other Ambulatory Visit: Payer: Self-pay

## 2024-08-02 DIAGNOSIS — C50112 Malignant neoplasm of central portion of left female breast: Secondary | ICD-10-CM

## 2024-08-03 ENCOUNTER — Inpatient Hospital Stay

## 2024-08-03 ENCOUNTER — Inpatient Hospital Stay: Attending: Hematology and Oncology

## 2024-08-03 VITALS — BP 143/73 | HR 64 | Temp 97.9°F | Resp 18 | Ht 59.0 in | Wt 244.0 lb

## 2024-08-03 DIAGNOSIS — C50812 Malignant neoplasm of overlapping sites of left female breast: Secondary | ICD-10-CM | POA: Diagnosis present

## 2024-08-03 DIAGNOSIS — Z8542 Personal history of malignant neoplasm of other parts of uterus: Secondary | ICD-10-CM | POA: Insufficient documentation

## 2024-08-03 DIAGNOSIS — Z1722 Progesterone receptor negative status: Secondary | ICD-10-CM | POA: Insufficient documentation

## 2024-08-03 DIAGNOSIS — C50112 Malignant neoplasm of central portion of left female breast: Secondary | ICD-10-CM

## 2024-08-03 DIAGNOSIS — C773 Secondary and unspecified malignant neoplasm of axilla and upper limb lymph nodes: Secondary | ICD-10-CM | POA: Diagnosis present

## 2024-08-03 DIAGNOSIS — Z170 Estrogen receptor positive status [ER+]: Secondary | ICD-10-CM | POA: Diagnosis not present

## 2024-08-03 DIAGNOSIS — Z5112 Encounter for antineoplastic immunotherapy: Secondary | ICD-10-CM | POA: Insufficient documentation

## 2024-08-03 DIAGNOSIS — Z1731 Human epidermal growth factor receptor 2 positive status: Secondary | ICD-10-CM | POA: Diagnosis not present

## 2024-08-03 DIAGNOSIS — Z171 Estrogen receptor negative status [ER-]: Secondary | ICD-10-CM

## 2024-08-03 LAB — CBC WITH DIFFERENTIAL (CANCER CENTER ONLY)
Abs Immature Granulocytes: 0.01 K/uL (ref 0.00–0.07)
Basophils Absolute: 0 K/uL (ref 0.0–0.1)
Basophils Relative: 0 %
Eosinophils Absolute: 0.1 K/uL (ref 0.0–0.5)
Eosinophils Relative: 2 %
HCT: 30.6 % — ABNORMAL LOW (ref 36.0–46.0)
Hemoglobin: 9.7 g/dL — ABNORMAL LOW (ref 12.0–15.0)
Immature Granulocytes: 0 %
Lymphocytes Relative: 28 %
Lymphs Abs: 1.6 K/uL (ref 0.7–4.0)
MCH: 28.4 pg (ref 26.0–34.0)
MCHC: 31.7 g/dL (ref 30.0–36.0)
MCV: 89.7 fL (ref 80.0–100.0)
Monocytes Absolute: 0.7 K/uL (ref 0.1–1.0)
Monocytes Relative: 12 %
Neutro Abs: 3.3 K/uL (ref 1.7–7.7)
Neutrophils Relative %: 58 %
Platelet Count: 202 K/uL (ref 150–400)
RBC: 3.41 MIL/uL — ABNORMAL LOW (ref 3.87–5.11)
RDW: 14 % (ref 11.5–15.5)
WBC Count: 5.7 K/uL (ref 4.0–10.5)
nRBC: 0 % (ref 0.0–0.2)

## 2024-08-03 LAB — CMP (CANCER CENTER ONLY)
ALT: 11 U/L (ref 0–44)
AST: 15 U/L (ref 15–41)
Albumin: 3.8 g/dL (ref 3.5–5.0)
Alkaline Phosphatase: 44 U/L (ref 38–126)
Anion gap: 6 (ref 5–15)
BUN: 36 mg/dL — ABNORMAL HIGH (ref 8–23)
CO2: 29 mmol/L (ref 22–32)
Calcium: 9.8 mg/dL (ref 8.9–10.3)
Chloride: 105 mmol/L (ref 98–111)
Creatinine: 1.37 mg/dL — ABNORMAL HIGH (ref 0.44–1.00)
GFR, Estimated: 43 mL/min — ABNORMAL LOW (ref >60)
Glucose, Bld: 125 mg/dL — ABNORMAL HIGH (ref 70–99)
Potassium: 3.3 mmol/L — ABNORMAL LOW (ref 3.5–5.1)
Sodium: 140 mmol/L (ref 135–145)
Total Bilirubin: 0.4 mg/dL (ref 0.0–1.2)
Total Protein: 7.5 g/dL (ref 6.5–8.1)

## 2024-08-03 MED ORDER — ACETAMINOPHEN 325 MG PO TABS
650.0000 mg | ORAL_TABLET | Freq: Once | ORAL | Status: AC
  Administered 2024-08-03: 650 mg via ORAL
  Filled 2024-08-03: qty 2

## 2024-08-03 MED ORDER — SODIUM CHLORIDE 0.9 % IV SOLN: Freq: Once | INTRAVENOUS | Status: AC

## 2024-08-03 MED ORDER — TRASTUZUMAB-ANNS CHEMO 150 MG IV SOLR
6.0000 mg/kg | Freq: Once | INTRAVENOUS | Status: AC
  Administered 2024-08-03: 600 mg via INTRAVENOUS
  Filled 2024-08-03: qty 28.57

## 2024-08-03 MED ORDER — DIPHENHYDRAMINE HCL 25 MG PO CAPS
25.0000 mg | ORAL_CAPSULE | Freq: Once | ORAL | Status: AC
  Administered 2024-08-03: 25 mg via ORAL
  Filled 2024-08-03: qty 1

## 2024-08-03 NOTE — Patient Instructions (Signed)
 [facility]  Discharge Instructions: Thank you for choosing [facility] to provide your oncology and hematology care.   If you have a lab appointment with the Cancer Center, please go directly to the Cancer Center and check in at the registration area.   Wear comfortable clothing and clothing appropriate for easy access to any Portacath or PICC line.   We strive to give you quality time with your provider. You may need to reschedule your appointment if you arrive late (15 or more minutes).  Arriving late affects you and other patients whose appointments are after yours.  Also, if you miss three or more appointments without notifying the office, you may be dismissed from the clinic at the provider's discretion.      For prescription refill requests, have your pharmacy contact our office and allow 72 hours for refills to be completed.    Today you received the following chemotherapy and/or immunotherapy agents: Trastuzumab      To help prevent nausea and vomiting after your treatment, we encourage you to take your nausea medication as directed.  BELOW ARE SYMPTOMS THAT SHOULD BE REPORTED IMMEDIATELY: *FEVER GREATER THAN 100.4 F (38 C) OR HIGHER *CHILLS OR SWEATING *NAUSEA AND VOMITING THAT IS NOT CONTROLLED WITH YOUR NAUSEA MEDICATION *UNUSUAL SHORTNESS OF BREATH *UNUSUAL BRUISING OR BLEEDING *URINARY PROBLEMS (pain or burning when urinating, or frequent urination) *BOWEL PROBLEMS (unusual diarrhea, constipation, pain near the anus) TENDERNESS IN MOUTH AND THROAT WITH OR WITHOUT PRESENCE OF ULCERS (sore throat, sores in mouth, or a toothache) UNUSUAL RASH, SWELLING OR PAIN  UNUSUAL VAGINAL DISCHARGE OR ITCHING   Items with * indicate a potential emergency and should be followed up as soon as possible or go to the Emergency Department if any problems should occur.  Please show the CHEMOTHERAPY ALERT CARD or  IMMUNOTHERAPY ALERT CARD at check-in to the Emergency Department and triage nurse.  Should you have questions after your visit or need to cancel or reschedule your appointment, please contact [facility]  Dept: [phone]  and follow the prompts.  Office hours are 8:00 a.m. to 4:30 p.m. Monday - Friday. Please note that voicemails left after 4:00 p.m. may not be returned until the following business day.  We are closed weekends and major holidays. You have access to a nurse at all times for urgent questions. Please call the main number to the clinic Dept: [phone] and follow the prompts.   For any non-urgent questions, you may also contact your provider using MyChart. We now offer e-Visits for anyone 62 and older to request care online for non-urgent symptoms. For details visit [email or web address].   Also download the MyChart app! Go to the app store, search "MyChart", open the app, select [place], and log in with your MyChart username and password.

## 2024-08-04 ENCOUNTER — Encounter: Payer: Self-pay | Admitting: Pharmacist

## 2024-08-04 NOTE — Progress Notes (Signed)
 Pharmacy Quality Measure Review  This patient is appearing on a report for being at risk of failing the adherence measure for diabetes medications this calendar year.   Medication: Mounjaro  Last fill date: 04/07/24 for 28 day supply  Per chart review, pt no longer on Mounjaro . Changed to Ozempic in September, last filled  07/11/24 for 42 DS. No action needed.  Darrelyn Drum, PharmD, BCPS, CPP Clinical Pharmacist Practitioner [facility] [phone]

## 2024-08-06 ENCOUNTER — Ambulatory Visit: Payer: Self-pay | Admitting: Hematology and Oncology

## 2024-08-07 ENCOUNTER — Encounter: Payer: Self-pay | Admitting: Hematology and Oncology

## 2024-08-07 NOTE — Telephone Encounter (Signed)
 Spoke with patient about her recent labs that reflected possible dehydration.  Encouraged her to increase fluid intake.  Dr Loretha wanted her to have labs done in 2 weeks.  She is already scheduled in 2 ish weeks so she will push fluids till then.

## 2024-08-07 NOTE — Telephone Encounter (Signed)
 Patient called back to ask if the Lasix  was contributing to her increased BUN and Crt.  Per Dr Loretha yes and she should dose reduce her Lasix  to see if any improvement along with increasing hydration.  Patient states that she takes the Lasix  for fluid around her heart.  Advised to call back if she develops increased SOB from decreasing the dose or if the swelling increases.  Stated understanding.

## 2024-08-21 ENCOUNTER — Other Ambulatory Visit: Payer: Self-pay | Admitting: *Deleted

## 2024-08-21 DIAGNOSIS — C50112 Malignant neoplasm of central portion of left female breast: Secondary | ICD-10-CM

## 2024-08-23 ENCOUNTER — Inpatient Hospital Stay (HOSPITAL_BASED_OUTPATIENT_CLINIC_OR_DEPARTMENT_OTHER): Admitting: Hematology and Oncology

## 2024-08-23 ENCOUNTER — Encounter: Payer: Self-pay | Admitting: Hematology and Oncology

## 2024-08-23 ENCOUNTER — Inpatient Hospital Stay

## 2024-08-23 ENCOUNTER — Inpatient Hospital Stay: Attending: Hematology and Oncology

## 2024-08-23 VITALS — BP 132/78 | HR 67 | Temp 97.6°F | Resp 18 | Wt 239.0 lb

## 2024-08-23 VITALS — HR 60 | Temp 98.2°F | Resp 18

## 2024-08-23 DIAGNOSIS — C773 Secondary and unspecified malignant neoplasm of axilla and upper limb lymph nodes: Secondary | ICD-10-CM | POA: Diagnosis present

## 2024-08-23 DIAGNOSIS — Z171 Estrogen receptor negative status [ER-]: Secondary | ICD-10-CM

## 2024-08-23 DIAGNOSIS — Z86718 Personal history of other venous thrombosis and embolism: Secondary | ICD-10-CM | POA: Insufficient documentation

## 2024-08-23 DIAGNOSIS — Z79810 Long term (current) use of selective estrogen receptor modulators (SERMs): Secondary | ICD-10-CM | POA: Diagnosis not present

## 2024-08-23 DIAGNOSIS — Z1731 Human epidermal growth factor receptor 2 positive status: Secondary | ICD-10-CM | POA: Insufficient documentation

## 2024-08-23 DIAGNOSIS — Z923 Personal history of irradiation: Secondary | ICD-10-CM | POA: Insufficient documentation

## 2024-08-23 DIAGNOSIS — Z8542 Personal history of malignant neoplasm of other parts of uterus: Secondary | ICD-10-CM | POA: Insufficient documentation

## 2024-08-23 DIAGNOSIS — G629 Polyneuropathy, unspecified: Secondary | ICD-10-CM | POA: Diagnosis not present

## 2024-08-23 DIAGNOSIS — Z9221 Personal history of antineoplastic chemotherapy: Secondary | ICD-10-CM | POA: Insufficient documentation

## 2024-08-23 DIAGNOSIS — C50812 Malignant neoplasm of overlapping sites of left female breast: Secondary | ICD-10-CM | POA: Diagnosis present

## 2024-08-23 DIAGNOSIS — Z1722 Progesterone receptor negative status: Secondary | ICD-10-CM | POA: Diagnosis not present

## 2024-08-23 DIAGNOSIS — C50112 Malignant neoplasm of central portion of left female breast: Secondary | ICD-10-CM | POA: Diagnosis not present

## 2024-08-23 DIAGNOSIS — Z5112 Encounter for antineoplastic immunotherapy: Secondary | ICD-10-CM | POA: Insufficient documentation

## 2024-08-23 LAB — CMP (CANCER CENTER ONLY)
ALT: 11 U/L (ref 0–44)
AST: 17 U/L (ref 15–41)
Albumin: 3.9 g/dL (ref 3.5–5.0)
Alkaline Phosphatase: 44 U/L (ref 38–126)
Anion gap: 8 (ref 5–15)
BUN: 19 mg/dL (ref 8–23)
CO2: 27 mmol/L (ref 22–32)
Calcium: 9.6 mg/dL (ref 8.9–10.3)
Chloride: 103 mmol/L (ref 98–111)
Creatinine: 1.07 mg/dL — ABNORMAL HIGH (ref 0.44–1.00)
GFR, Estimated: 58 mL/min — ABNORMAL LOW (ref >60)
Glucose, Bld: 117 mg/dL — ABNORMAL HIGH (ref 70–99)
Potassium: 3.4 mmol/L — ABNORMAL LOW (ref 3.5–5.1)
Sodium: 138 mmol/L (ref 135–145)
Total Bilirubin: 0.5 mg/dL (ref 0.0–1.2)
Total Protein: 8.1 g/dL (ref 6.5–8.1)

## 2024-08-23 LAB — CBC WITH DIFFERENTIAL (CANCER CENTER ONLY)
Abs Immature Granulocytes: 0.01 K/uL (ref 0.00–0.07)
Basophils Absolute: 0 K/uL (ref 0.0–0.1)
Basophils Relative: 1 %
Eosinophils Absolute: 0.1 K/uL (ref 0.0–0.5)
Eosinophils Relative: 3 %
HCT: 32 % — ABNORMAL LOW (ref 36.0–46.0)
Hemoglobin: 10.5 g/dL — ABNORMAL LOW (ref 12.0–15.0)
Immature Granulocytes: 0 %
Lymphocytes Relative: 25 %
Lymphs Abs: 1.1 K/uL (ref 0.7–4.0)
MCH: 28.8 pg (ref 26.0–34.0)
MCHC: 32.8 g/dL (ref 30.0–36.0)
MCV: 87.9 fL (ref 80.0–100.0)
Monocytes Absolute: 0.5 K/uL (ref 0.1–1.0)
Monocytes Relative: 11 %
Neutro Abs: 2.6 K/uL (ref 1.7–7.7)
Neutrophils Relative %: 60 %
Platelet Count: 220 K/uL (ref 150–400)
RBC: 3.64 MIL/uL — ABNORMAL LOW (ref 3.87–5.11)
RDW: 13.7 % (ref 11.5–15.5)
WBC Count: 4.3 K/uL (ref 4.0–10.5)
nRBC: 0 % (ref 0.0–0.2)

## 2024-08-23 MED ORDER — ACETAMINOPHEN 325 MG PO TABS
650.0000 mg | ORAL_TABLET | Freq: Once | ORAL | Status: AC
  Administered 2024-08-23: 650 mg via ORAL
  Filled 2024-08-23: qty 2

## 2024-08-23 MED ORDER — DIPHENHYDRAMINE HCL 25 MG PO CAPS
25.0000 mg | ORAL_CAPSULE | Freq: Once | ORAL | Status: AC
  Administered 2024-08-23: 25 mg via ORAL
  Filled 2024-08-23: qty 1

## 2024-08-23 MED ORDER — TRASTUZUMAB-ANNS CHEMO 150 MG IV SOLR
6.0000 mg/kg | Freq: Once | INTRAVENOUS | Status: AC
  Administered 2024-08-23: 600 mg via INTRAVENOUS
  Filled 2024-08-23: qty 28.57

## 2024-08-23 MED ORDER — SODIUM CHLORIDE 0.9 % IV SOLN: Freq: Once | INTRAVENOUS | Status: AC

## 2024-08-23 NOTE — Progress Notes (Signed)
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  Social Connections: Moderately Integrated (06/15/2023)   Social Connection and Isolation Panel   . Frequency of Communication with Friends and Family: More than three times a week   . Frequency of Social Gatherings with Friends and Family: More than three times a week   . Attends Religious Services: More than 4 times per year   . Active Member of Clubs or Organizations: Yes   . Attends Banker Meetings: More than 4 times per year   . Marital Status: Divorced  Catering Manager Violence: Not At Risk (06/15/2023)   Humiliation, Afraid, Rape, and Kick questionnaire   . Fear of Current or Ex-Partner: No   . Emotionally Abused: No   . Physically Abused: No   . Sexually Abused: No    FAMILY HISTORY: Family History  Problem Relation Age of Onset  . Diabetes Mother   . Hypertension Mother   . Ovarian cancer Mother   . Heart disease Father        COPD  . Alcohol abuse Father        ETOH dependence  . Breast cancer Maternal Aunt         dx in her 17s  . Lung cancer Maternal Uncle   . Breast cancer Paternal Aunt   . Cancer Maternal Grandmother        salivary gland cancer  . Colon cancer Neg Hx   . Colon polyps Neg Hx   . Esophageal cancer Neg Hx   . Stomach cancer Neg Hx   . Rectal cancer Neg Hx     Review of Systems  Constitutional:  Negative for appetite change, chills, fatigue, fever and unexpected weight change.  HENT:   Negative for hearing loss, lump/mass and trouble swallowing.   Eyes:  Negative for eye problems and icterus.  Respiratory:  Negative for chest tightness, cough and shortness of breath.   Cardiovascular:  Negative for chest pain, leg swelling and palpitations.  Gastrointestinal:  Negative for abdominal distention, abdominal pain, constipation, diarrhea, nausea and vomiting.  Endocrine: Negative for hot flashes.  Genitourinary:  Negative for difficulty urinating.   Musculoskeletal:  Negative for arthralgias.  Skin:  Negative for itching and rash.  Neurological:  Negative for dizziness, extremity weakness, headaches and numbness.  Hematological:  Negative for adenopathy. Does not bruise/bleed easily.  Psychiatric/Behavioral:  Negative for depression. The patient is not nervous/anxious.       PHYSICAL EXAMINATION     LMP 05/18/2016   Physical Exam Constitutional:      General: She is not in acute distress.    Appearance: Normal appearance. She is not toxic-appearing.  HENT:  Head: Normocephalic and atraumatic.     Mouth/Throat:     Mouth: Mucous membranes are moist.     Pharynx: Oropharynx is clear. No oropharyngeal exudate or posterior oropharyngeal erythema.  Eyes:     General: No scleral icterus. Cardiovascular:     Rate and Rhythm: Normal rate and regular rhythm.     Pulses: Normal pulses.     Heart sounds: Normal heart sounds.  Pulmonary:     Effort: Pulmonary effort is normal.     Breath sounds: No wheezing.  Abdominal:     General: Abdomen is flat. Bowel sounds  are normal. There is no distension.     Palpations: Abdomen is soft.     Tenderness: There is no abdominal tenderness.  Musculoskeletal:        General: Swelling (BLE noted) present.     Cervical back: Neck supple.  Lymphadenopathy:     Cervical: No cervical adenopathy.  Skin:    General: Skin is warm and dry.     Findings: No rash.  Neurological:     General: No focal deficit present.     Mental Status: She is alert.     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        Behavior: Behavior normal.     LABORATORY DATA:  CBC    Component Value Date/Time   WBC 5.7 08/03/2024 0809   WBC 6.4 09/27/2020 1114   RBC 3.41 (L) 08/03/2024 0809   HGB 9.7 (L) 08/03/2024 0809   HGB 11.9 03/30/2017 1044   HCT 30.6 (L) 08/03/2024 0809   HCT 37.4 03/30/2017 1044   PLT 202 08/03/2024 0809   PLT 238 03/30/2017 1044   MCV 89.7 08/03/2024 0809   MCV 85.0 03/30/2017 1044   MCH 28.4 08/03/2024 0809   MCHC 31.7 08/03/2024 0809   RDW 14.0 08/03/2024 0809   RDW 15.1 (H) 03/30/2017 1044   LYMPHSABS 1.6 08/03/2024 0809   LYMPHSABS 1.3 03/30/2017 1044   MONOABS 0.7 08/03/2024 0809   MONOABS 0.5 03/30/2017 1044   EOSABS 0.1 08/03/2024 0809   EOSABS 0.0 03/30/2017 1044   BASOSABS 0.0 08/03/2024 0809   BASOSABS 0.0 03/30/2017 1044    CMP     Component Value Date/Time   NA 140 08/03/2024 0809   NA 143 03/30/2017 1044   K 3.3 (L) 08/03/2024 0809   K 3.8 03/30/2017 1044   CL 105 08/03/2024 0809   CL 106 01/18/2013 0909   CO2 29 08/03/2024 0809   CO2 27 03/30/2017 1044   GLUCOSE 125 (H) 08/03/2024 0809   GLUCOSE 91 03/30/2017 1044   GLUCOSE 99 01/18/2013 0909   BUN 36 (H) 08/03/2024 0809   BUN 9.7 03/30/2017 1044   CREATININE 1.37 (H) 08/03/2024 0809   CREATININE 0.8 03/30/2017 1044   CALCIUM  9.8 08/03/2024 0809   CALCIUM  9.7 03/30/2017 1044   PROT 7.5 08/03/2024 0809   PROT 8.0  03/30/2017 1044   ALBUMIN 3.8 08/03/2024 0809   ALBUMIN 3.8 01/27/2021 1232   ALBUMIN 3.4 (L) 03/30/2017 1044   AST 15 08/03/2024 0809   AST 17 03/30/2017 1044   ALT 11 08/03/2024 0809   ALT 15 03/30/2017 1044   ALKPHOS 44 08/03/2024 0809   ALKPHOS 60 03/30/2017 1044   BILITOT 0.4 08/03/2024 0809   BILITOT 0.34 03/30/2017 1044   GFRNONAA 43 (L) 08/03/2024 0809  GFRAA >60 07/11/2020 0923    ASSESSMENT and THERAPY PLAN:   Assessment and Plan Assessment & Plan Metastatic Breast cancer on maintenance herceptin  therapy Herceptin  therapy ongoing. PET scan every six months for monitoring. Echocardiogram due in early November. - Continue Herceptin  therapy. - Schedule PET scan every six months. - Schedule echocardiogram with Dr. Rolan. - No concern for recurrence based on ROS or PE  Chronic kidney disease GFR at 43 mL/min, slightly reduced from baseline. No immediate intervention needed. Discussed monitoring kidney function, especially with diabetes and hypertension. Furosemide  may affect creatinine and potassium levels. - Ensure adequate hydration. - Monitor kidney function regularly. - Discuss kidney function monitoring with primary care physician.  Peripheral neuropathy Symptoms unchanged, numbness in feet, possibly worsened by cold.  All questions were answered. The patient knows to call the clinic with any problems, questions or concerns. We can certainly see the patient much sooner if necessary.  Total encounter time:30 minutes*in face-to-face visit time, chart review, lab review, care coordination, order entry, and documentation of the encounter time.   *Total Encounter Time as defined by the Centers for Medicare and Medicaid Services includes, in addition to the face-to-face time of a patient visit (documented in the note above) non-face-to-face time: obtaining and reviewing outside history, ordering and reviewing medications, tests or procedures, care coordination  (communications with other health care professionals or caregivers) and documentation in the medical record.

## 2024-08-23 NOTE — Progress Notes (Signed)
Treatment given per orders. Patient tolerated it well without problems. Vitals stable and discharged home from clinic ambulatory. Follow up as scheduled.  

## 2024-08-23 NOTE — Patient Instructions (Signed)
 [facility] - A DEPT OF [facility]  Discharge Instructions: Thank you for choosing [facility] to provide your oncology and hematology care.   If you have a lab appointment with the Cancer Center, please go directly to the Cancer Center and check in at the registration area.   Wear comfortable clothing and clothing appropriate for easy access to any Portacath or PICC line.   We strive to give you quality time with your provider. You may need to reschedule your appointment if you arrive late (15 or more minutes).  Arriving late affects you and other patients whose appointments are after yours.  Also, if you miss three or more appointments without notifying the office, you may be dismissed from the clinic at the provider's discretion.      For prescription refill requests, have your pharmacy contact our office and allow 72 hours for refills to be completed.    Today you received the following chemotherapy and/or immunotherapy agents Trastuzumab  today.    To help prevent nausea and vomiting after your treatment, we encourage you to take your nausea medication as directed.  BELOW ARE SYMPTOMS THAT SHOULD BE REPORTED IMMEDIATELY: *FEVER GREATER THAN 100.4 F (38 C) OR HIGHER *CHILLS OR SWEATING *NAUSEA AND VOMITING THAT IS NOT CONTROLLED WITH YOUR NAUSEA MEDICATION *UNUSUAL SHORTNESS OF BREATH *UNUSUAL BRUISING OR BLEEDING *URINARY PROBLEMS (pain or burning when urinating, or frequent urination) *BOWEL PROBLEMS (unusual diarrhea, constipation, pain near the anus) TENDERNESS IN MOUTH AND THROAT WITH OR WITHOUT PRESENCE OF ULCERS (sore throat, sores in mouth, or a toothache) UNUSUAL RASH, SWELLING OR PAIN  UNUSUAL VAGINAL DISCHARGE OR ITCHING   Items with * indicate a potential emergency and should be followed up as soon as possible or go to the Emergency Department if any problems should occur.  Please show the CHEMOTHERAPY ALERT CARD or  IMMUNOTHERAPY ALERT CARD at check-in to the Emergency Department and triage nurse.  Should you have questions after your visit or need to cancel or reschedule your appointment, please contact [facility] - A DEPT [facility]  Dept: [phone]  and follow the prompts.  Office hours are 8:00 a.m. to 4:30 p.m. Monday - Friday. Please note that voicemails left after 4:00 p.m. may not be returned until the following business day.  We are closed weekends and major holidays. You have access to a nurse at all times for urgent questions. Please call the main number to the clinic Dept: [phone] and follow the prompts.   For any non-urgent questions, you may also contact your provider using MyChart. We now offer e-Visits for anyone 42 and older to request care online for non-urgent symptoms. For details visit [email or web address].   Also download the MyChart app! Go to the app store, search MyChart, open the app, select [facility], and log in with your MyChart username and password.

## 2024-09-07 ENCOUNTER — Ambulatory Visit (HOSPITAL_COMMUNITY)
Admission: RE | Admit: 2024-09-07 | Discharge: 2024-09-07 | Disposition: A | Discharge status: Home / Self Care | Source: Ambulatory Visit | Attending: Internal Medicine | Admitting: Internal Medicine

## 2024-09-07 DIAGNOSIS — I5032 Chronic diastolic (congestive) heart failure: Secondary | ICD-10-CM | POA: Insufficient documentation

## 2024-09-07 LAB — ECHOCARDIOGRAM COMPLETE
AR max vel: 2.6 cm2
AV Area VTI: 2.66 cm2
AV Area mean vel: 2.65 cm2
AV Mean grad: 4 mmHg
AV Peak grad: 8.1 mmHg
Ao pk vel: 1.42 m/s
Area-P 1/2: 4.74 cm2
Calc EF: 66 %
MV VTI: 3.56 cm2
S' Lateral: 3.4 cm
Single Plane A2C EF: 65 %
Single Plane A4C EF: 67.1 %

## 2024-09-07 NOTE — Progress Notes (Signed)
  Echocardiogram 2D Echocardiogram has been performed.  Norleen ORN [facility] 09/07/2024, 9:43 AM

## 2024-09-08 NOTE — Telephone Encounter (Signed)
-----   Message from Ezra Shuck sent at 09/07/2024 11:18 PM EST ----- LV EF is normal, RV is normal.  ----- Message ----- From: Interface, [employer] Sent: 09/07/2024   9:56 AM EST To: Ezra GORMAN Shuck, MD

## 2024-09-08 NOTE — Telephone Encounter (Signed)
 Pt aware, agreeable, and verbalized understanding

## 2024-09-12 ENCOUNTER — Other Ambulatory Visit: Payer: Self-pay

## 2024-09-12 DIAGNOSIS — C50112 Malignant neoplasm of central portion of left female breast: Secondary | ICD-10-CM

## 2024-09-13 ENCOUNTER — Inpatient Hospital Stay

## 2024-09-13 ENCOUNTER — Encounter: Payer: Self-pay | Admitting: Hematology and Oncology

## 2024-09-13 ENCOUNTER — Other Ambulatory Visit: Payer: Self-pay

## 2024-09-13 VITALS — BP 120/77 | HR 64 | Temp 98.2°F | Resp 18 | Wt 236.8 lb

## 2024-09-13 DIAGNOSIS — C50112 Malignant neoplasm of central portion of left female breast: Secondary | ICD-10-CM

## 2024-09-13 DIAGNOSIS — Z5112 Encounter for antineoplastic immunotherapy: Secondary | ICD-10-CM | POA: Diagnosis not present

## 2024-09-13 DIAGNOSIS — C50812 Malignant neoplasm of overlapping sites of left female breast: Secondary | ICD-10-CM

## 2024-09-13 LAB — CBC WITH DIFFERENTIAL (CANCER CENTER ONLY)
Abs Immature Granulocytes: 0.02 K/uL (ref 0.00–0.07)
Basophils Absolute: 0 K/uL (ref 0.0–0.1)
Basophils Relative: 1 %
Eosinophils Absolute: 0.2 K/uL (ref 0.0–0.5)
Eosinophils Relative: 3 %
HCT: 32 % — ABNORMAL LOW (ref 36.0–46.0)
Hemoglobin: 10.3 g/dL — ABNORMAL LOW (ref 12.0–15.0)
Immature Granulocytes: 0 %
Lymphocytes Relative: 25 %
Lymphs Abs: 1.3 K/uL (ref 0.7–4.0)
MCH: 28.3 pg (ref 26.0–34.0)
MCHC: 32.2 g/dL (ref 30.0–36.0)
MCV: 87.9 fL (ref 80.0–100.0)
Monocytes Absolute: 0.6 K/uL (ref 0.1–1.0)
Monocytes Relative: 12 %
Neutro Abs: 3.2 K/uL (ref 1.7–7.7)
Neutrophils Relative %: 59 %
Platelet Count: 230 K/uL (ref 150–400)
RBC: 3.64 MIL/uL — ABNORMAL LOW (ref 3.87–5.11)
RDW: 13.7 % (ref 11.5–15.5)
WBC Count: 5.4 K/uL (ref 4.0–10.5)
nRBC: 0 % (ref 0.0–0.2)

## 2024-09-13 LAB — CMP (CANCER CENTER ONLY)
ALT: 17 U/L (ref 0–44)
AST: 24 U/L (ref 15–41)
Albumin: 4.1 g/dL (ref 3.5–5.0)
Alkaline Phosphatase: 53 U/L (ref 38–126)
Anion gap: 11 (ref 5–15)
BUN: 29 mg/dL — ABNORMAL HIGH (ref 8–23)
CO2: 28 mmol/L (ref 22–32)
Calcium: 9.8 mg/dL (ref 8.9–10.3)
Chloride: 102 mmol/L (ref 98–111)
Creatinine: 1.15 mg/dL — ABNORMAL HIGH (ref 0.44–1.00)
GFR, Estimated: 53 mL/min — ABNORMAL LOW (ref >60)
Glucose, Bld: 112 mg/dL — ABNORMAL HIGH (ref 70–99)
Potassium: 3.2 mmol/L — ABNORMAL LOW (ref 3.5–5.1)
Sodium: 141 mmol/L (ref 135–145)
Total Bilirubin: 0.4 mg/dL (ref 0.0–1.2)
Total Protein: 8.2 g/dL — ABNORMAL HIGH (ref 6.5–8.1)

## 2024-09-13 MED ORDER — TRASTUZUMAB-ANNS CHEMO 150 MG IV SOLR
6.0000 mg/kg | Freq: Once | INTRAVENOUS | Status: AC
  Administered 2024-09-13: 600 mg via INTRAVENOUS
  Filled 2024-09-13: qty 28.57

## 2024-09-13 MED ORDER — ACETAMINOPHEN 325 MG PO TABS
650.0000 mg | ORAL_TABLET | Freq: Once | ORAL | Status: AC
  Administered 2024-09-13: 650 mg via ORAL
  Filled 2024-09-13: qty 2

## 2024-09-13 MED ORDER — DIPHENHYDRAMINE HCL 25 MG PO CAPS
25.0000 mg | ORAL_CAPSULE | Freq: Once | ORAL | Status: AC
  Administered 2024-09-13: 25 mg via ORAL
  Filled 2024-09-13: qty 1

## 2024-09-13 MED ORDER — SODIUM CHLORIDE 0.9 % IV SOLN: Freq: Once | INTRAVENOUS | Status: AC

## 2024-09-13 NOTE — Patient Instructions (Signed)
 [facility]  Discharge Instructions: Thank you for choosing [facility] to provide your oncology and hematology care.   If you have a lab appointment with the Cancer Center, please go directly to the Cancer Center and check in at the registration area.   Wear comfortable clothing and clothing appropriate for easy access to any Portacath or PICC line.   We strive to give you quality time with your provider. You may need to reschedule your appointment if you arrive late (15 or more minutes).  Arriving late affects you and other patients whose appointments are after yours.  Also, if you miss three or more appointments without notifying the office, you may be dismissed from the clinic at the provider's discretion.      For prescription refill requests, have your pharmacy contact our office and allow 72 hours for refills to be completed.    Today you received the following chemotherapy and/or immunotherapy agents: Trastuzumab      To help prevent nausea and vomiting after your treatment, we encourage you to take your nausea medication as directed.  BELOW ARE SYMPTOMS THAT SHOULD BE REPORTED IMMEDIATELY: *FEVER GREATER THAN 100.4 F (38 C) OR HIGHER *CHILLS OR SWEATING *NAUSEA AND VOMITING THAT IS NOT CONTROLLED WITH YOUR NAUSEA MEDICATION *UNUSUAL SHORTNESS OF BREATH *UNUSUAL BRUISING OR BLEEDING *URINARY PROBLEMS (pain or burning when urinating, or frequent urination) *BOWEL PROBLEMS (unusual diarrhea, constipation, pain near the anus) TENDERNESS IN MOUTH AND THROAT WITH OR WITHOUT PRESENCE OF ULCERS (sore throat, sores in mouth, or a toothache) UNUSUAL RASH, SWELLING OR PAIN  UNUSUAL VAGINAL DISCHARGE OR ITCHING   Items with * indicate a potential emergency and should be followed up as soon as possible or go to the Emergency Department if any problems should occur.  Please show the CHEMOTHERAPY ALERT CARD or  IMMUNOTHERAPY ALERT CARD at check-in to the Emergency Department and triage nurse.  Should you have questions after your visit or need to cancel or reschedule your appointment, please contact [facility]  Dept: [phone]  and follow the prompts.  Office hours are 8:00 a.m. to 4:30 p.m. Monday - Friday. Please note that voicemails left after 4:00 p.m. may not be returned until the following business day.  We are closed weekends and major holidays. You have access to a nurse at all times for urgent questions. Please call the main number to the clinic Dept: [phone] and follow the prompts.   For any non-urgent questions, you may also contact your provider using MyChart. We now offer e-Visits for anyone 62 and older to request care online for non-urgent symptoms. For details visit [email or web address].   Also download the MyChart app! Go to the app store, search "MyChart", open the app, select [place], and log in with your MyChart username and password.

## 2024-09-13 NOTE — Progress Notes (Signed)
 error

## 2024-09-25 ENCOUNTER — Other Ambulatory Visit: Payer: Self-pay

## 2024-09-25 ENCOUNTER — Other Ambulatory Visit: Payer: Self-pay | Admitting: Internal Medicine

## 2024-09-26 ENCOUNTER — Telehealth: Admitting: Neurology

## 2024-09-26 DIAGNOSIS — G959 Disease of spinal cord, unspecified: Secondary | ICD-10-CM

## 2024-09-26 DIAGNOSIS — M62838 Other muscle spasm: Secondary | ICD-10-CM

## 2024-09-26 DIAGNOSIS — R2681 Unsteadiness on feet: Secondary | ICD-10-CM

## 2024-09-26 NOTE — Progress Notes (Signed)
   Virtual Visit via Video Note The purpose of this virtual visit is to provide medical care while limiting exposure to the novel coronavirus.    Consent was obtained for video visit:  Yes.   Answered questions that patient had about telehealth interaction:  Yes.   I discussed the limitations, risks, security and privacy concerns of performing an evaluation and management service by telemedicine. I also discussed with the patient that there may be a patient responsible charge related to this service. The patient expressed understanding and agreed to proceed.  Pt location: Home Physician Location: office Name of referring provider:  [name] ORN, MD I connected with [name] at patients initiation/request on 09/26/2024 at 10:30 AM EST by video enabled telemedicine application and verified that I am speaking with the correct person using two identifiers. Pt MRN:  [id number] Pt DOB:  Mar 23, 1956 Video Participants:  [name]   History of Present Illness: This is a 68 y.o. female returning for follow-up of muscle spasms.  She reports that muscle spasms are better and attributes this to staying better hydrated.    She completed physical therapy and had a fall on the last day of therapy. She admits that she did not keep up with exercises.  She went to the ER and had MRI cervical spine which did not show any acute changes.   Observations/Objective:   There were no vitals filed for this visit. Patient is awake, alert, and appears comfortable.  Oriented x 4.   Extraocular muscles are intact. No ptosis.  Face is symmetric.  Speech is not dysarthric.  Antigravity in all extremities.  No pronator drift. Gait appears mildly wide-based, slight drag of right leg, unassisted.   Assessment and Plan:  Muscle spasm, contributed by hypokalemia, dehydration, and cervical myelopathy.   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  She has seen Dr. Camella who offered CTS, but mentioned this may not improve her symptoms.   Follow Up Instructions:   I discussed the assessment and treatment plan with the patient. The patient was provided an opportunity to ask questions and all were answered. The patient agreed with the plan and demonstrated an understanding of the instructions.   The patient was advised to call back or seek an in-person evaluation if the symptoms worsen or if the condition fails to improve as anticipated.  Follow-up in 6 months   Milus Fritze K Eleno Weimar, DO

## 2024-09-27 ENCOUNTER — Other Ambulatory Visit: Payer: Self-pay | Admitting: Hematology and Oncology

## 2024-09-27 ENCOUNTER — Other Ambulatory Visit: Payer: Self-pay

## 2024-09-27 NOTE — Progress Notes (Unsigned)
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 than three times a week    Attends Religious Services: More than 4 times per year    Active Member of [employer] or Organizations: Yes    Attends Engineer, Structural: More than 4 times per year    Marital Status: Divorced  Intimate Partner Violence: Not At Risk (06/15/2023)   Humiliation, Afraid, Rape, and Kick questionnaire    Fear of Current or Ex-Partner: No    Emotionally Abused: No    Physically Abused: No    Sexually Abused: No    FAMILY HISTORY: Family History  Problem Relation Age of Onset   Diabetes Mother    Hypertension Mother    Ovarian cancer Mother    Heart disease Father        COPD   Alcohol abuse Father        ETOH dependence   Breast cancer Maternal Aunt         dx in her 43s   Lung cancer Maternal Uncle    Breast cancer Paternal Aunt    Cancer Maternal Grandmother        salivary gland cancer   Colon cancer Neg Hx    Colon polyps Neg Hx    Esophageal cancer Neg Hx    Stomach cancer Neg Hx    Rectal cancer Neg Hx     Review of Systems  Constitutional:  Negative for appetite change, chills, fatigue, fever and unexpected weight change.  HENT:   Negative for hearing loss, lump/mass and trouble swallowing.   Eyes:  Negative for eye problems and icterus.  Respiratory:  Negative for chest tightness, cough and shortness of breath.   Cardiovascular:  Negative for chest pain, leg swelling and palpitations.  Gastrointestinal:  Negative for abdominal distention, abdominal pain, constipation, diarrhea, nausea and vomiting.  Endocrine: Negative for hot flashes.  Genitourinary:  Negative for difficulty urinating.   Musculoskeletal:  Negative for arthralgias.  Skin:  Negative for itching and rash.  Neurological:  Negative for dizziness, extremity weakness, headaches and numbness.  Hematological:  Negative for adenopathy. Does not bruise/bleed easily.  Psychiatric/Behavioral:  Negative for depression. The patient is not nervous/anxious.       PHYSICAL EXAMINATION     LMP 05/18/2016   Physical Exam Constitutional:      General: She is not in acute distress.    Appearance: Normal appearance. She is not toxic-appearing.  HENT:  Head: Normocephalic and atraumatic.     Mouth/Throat:     Mouth: Mucous membranes are moist.     Pharynx: Oropharynx is clear. No oropharyngeal exudate or posterior oropharyngeal erythema.  Eyes:     General: No scleral icterus. Cardiovascular:     Rate and Rhythm: Normal rate and regular rhythm.     Pulses: Normal pulses.     Heart sounds: Normal heart sounds.  Pulmonary:     Effort: Pulmonary effort is normal.     Breath sounds: No wheezing.  Abdominal:     General: Abdomen is flat. Bowel sounds are  normal. There is no distension.     Palpations: Abdomen is soft.     Tenderness: There is no abdominal tenderness.  Musculoskeletal:        General: Swelling (BLE noted) present.     Cervical back: Neck supple.  Lymphadenopathy:     Cervical: No cervical adenopathy.  Skin:    General: Skin is warm and dry.     Findings: No rash.  Neurological:     General: No focal deficit present.     Mental Status: She is alert.     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        Behavior: Behavior normal.     LABORATORY DATA:  CBC    Component Value Date/Time   WBC 5.4 09/13/2024 0826   WBC 6.4 09/27/2020 1114   RBC 3.64 (L) 09/13/2024 0826   HGB 10.3 (L) 09/13/2024 0826   HGB 11.9 03/30/2017 1044   HCT 32.0 (L) 09/13/2024 0826   HCT 37.4 03/30/2017 1044   PLT 230 09/13/2024 0826   PLT 238 03/30/2017 1044   MCV 87.9 09/13/2024 0826   MCV 85.0 03/30/2017 1044   MCH 28.3 09/13/2024 0826   MCHC 32.2 09/13/2024 0826   RDW 13.7 09/13/2024 0826   RDW 15.1 (H) 03/30/2017 1044   LYMPHSABS 1.3 09/13/2024 0826   LYMPHSABS 1.3 03/30/2017 1044   MONOABS 0.6 09/13/2024 0826   MONOABS 0.5 03/30/2017 1044   EOSABS 0.2 09/13/2024 0826   EOSABS 0.0 03/30/2017 1044   BASOSABS 0.0 09/13/2024 0826   BASOSABS 0.0 03/30/2017 1044    CMP     Component Value Date/Time   NA 141 09/13/2024 0826   NA 143 03/30/2017 1044   K 3.2 (L) 09/13/2024 0826   K 3.8 03/30/2017 1044   CL 102 09/13/2024 0826   CL 106 01/18/2013 0909   CO2 28 09/13/2024 0826   CO2 27 03/30/2017 1044   GLUCOSE 112 (H) 09/13/2024 0826   GLUCOSE 91 03/30/2017 1044   GLUCOSE 99 01/18/2013 0909   BUN 29 (H) 09/13/2024 0826   BUN 9.7 03/30/2017 1044   CREATININE 1.15 (H) 09/13/2024 0826   CREATININE 0.8 03/30/2017 1044   CALCIUM  9.8 09/13/2024 0826   CALCIUM  9.7 03/30/2017 1044   PROT 8.2 (H) 09/13/2024 0826   PROT 8.0  03/30/2017 1044   ALBUMIN 4.1 09/13/2024 0826   ALBUMIN 3.8 01/27/2021 1232   ALBUMIN 3.4 (L) 03/30/2017 1044   AST 24 09/13/2024 0826   AST 17 03/30/2017 1044   ALT 17 09/13/2024 0826   ALT 15 03/30/2017 1044   ALKPHOS 53 09/13/2024 0826   ALKPHOS 60 03/30/2017 1044   BILITOT 0.4 09/13/2024 0826   BILITOT 0.34 03/30/2017 1044   GFRNONAA 53 (L) 09/13/2024 9173  GFRAA >60 07/11/2020 0923    ASSESSMENT and THERAPY PLAN:   Assessment and Plan Assessment & Plan Metastatic Breast cancer on maintenance herceptin  therapy Herceptin  therapy ongoing. PET scan every six months for monitoring. Echocardiogram due in early November. - Continue Herceptin  therapy. - Schedule PET scan every six months. - Schedule echocardiogram with Dr. Rolan. - No concern for recurrence based on ROS or PE  Chronic kidney disease GFR at 43 mL/min, slightly reduced from baseline. No immediate intervention needed. Discussed monitoring kidney function, especially with diabetes and hypertension. Furosemide  may affect creatinine and potassium levels. - Ensure adequate hydration. - Monitor kidney function regularly. - Discuss kidney function monitoring with primary care physician.  Peripheral neuropathy Symptoms unchanged, numbness in feet, possibly worsened by cold.  All questions were answered. The patient knows to call the clinic with any problems, questions or concerns. We can certainly see the patient much sooner if necessary.  Total encounter time:30 minutes*in face-to-face visit time, chart review, lab review, care coordination, order entry, and documentation of the encounter time.   *Total Encounter Time as defined by the Centers for Medicare and Medicaid Services includes, in addition to the face-to-face time of a patient visit (documented in the note above) non-face-to-face time: obtaining and reviewing outside history, ordering and reviewing medications, tests or procedures, care coordination  (communications with other health care professionals or caregivers) and documentation in the medical record.

## 2024-09-28 ENCOUNTER — Other Ambulatory Visit: Payer: Self-pay | Admitting: Hematology and Oncology

## 2024-09-28 DIAGNOSIS — C50112 Malignant neoplasm of central portion of left female breast: Secondary | ICD-10-CM

## 2024-09-28 DIAGNOSIS — C50812 Malignant neoplasm of overlapping sites of left female breast: Secondary | ICD-10-CM

## 2024-10-03 ENCOUNTER — Other Ambulatory Visit: Payer: Self-pay | Admitting: *Deleted

## 2024-10-03 DIAGNOSIS — C50112 Malignant neoplasm of central portion of left female breast: Secondary | ICD-10-CM

## 2024-10-03 DIAGNOSIS — C50812 Malignant neoplasm of overlapping sites of left female breast: Secondary | ICD-10-CM

## 2024-10-04 ENCOUNTER — Inpatient Hospital Stay: Attending: Hematology and Oncology | Admitting: Hematology and Oncology

## 2024-10-04 ENCOUNTER — Encounter: Payer: Self-pay | Admitting: Hematology and Oncology

## 2024-10-04 ENCOUNTER — Inpatient Hospital Stay: Attending: Hematology and Oncology

## 2024-10-04 ENCOUNTER — Inpatient Hospital Stay

## 2024-10-04 VITALS — BP 129/88 | HR 67 | Temp 98.1°F | Resp 18 | Wt 238.1 lb

## 2024-10-04 DIAGNOSIS — Z17421 Hormone receptor negative with human epidermal growth factor receptor 2 negative status: Secondary | ICD-10-CM | POA: Insufficient documentation

## 2024-10-04 DIAGNOSIS — Z90722 Acquired absence of ovaries, bilateral: Secondary | ICD-10-CM | POA: Insufficient documentation

## 2024-10-04 DIAGNOSIS — C50112 Malignant neoplasm of central portion of left female breast: Secondary | ICD-10-CM

## 2024-10-04 DIAGNOSIS — Z8041 Family history of malignant neoplasm of ovary: Secondary | ICD-10-CM | POA: Insufficient documentation

## 2024-10-04 DIAGNOSIS — Z90710 Acquired absence of both cervix and uterus: Secondary | ICD-10-CM | POA: Insufficient documentation

## 2024-10-04 DIAGNOSIS — C773 Secondary and unspecified malignant neoplasm of axilla and upper limb lymph nodes: Secondary | ICD-10-CM | POA: Insufficient documentation

## 2024-10-04 DIAGNOSIS — Z8542 Personal history of malignant neoplasm of other parts of uterus: Secondary | ICD-10-CM | POA: Insufficient documentation

## 2024-10-04 DIAGNOSIS — C50812 Malignant neoplasm of overlapping sites of left female breast: Secondary | ICD-10-CM | POA: Diagnosis not present

## 2024-10-04 DIAGNOSIS — Z9010 Acquired absence of unspecified breast and nipple: Secondary | ICD-10-CM | POA: Diagnosis not present

## 2024-10-04 DIAGNOSIS — Z1722 Progesterone receptor negative status: Secondary | ICD-10-CM | POA: Insufficient documentation

## 2024-10-04 DIAGNOSIS — Z171 Estrogen receptor negative status [ER-]: Secondary | ICD-10-CM

## 2024-10-04 DIAGNOSIS — Z923 Personal history of irradiation: Secondary | ICD-10-CM | POA: Diagnosis not present

## 2024-10-04 DIAGNOSIS — D630 Anemia in neoplastic disease: Secondary | ICD-10-CM | POA: Diagnosis not present

## 2024-10-04 DIAGNOSIS — Z86718 Personal history of other venous thrombosis and embolism: Secondary | ICD-10-CM | POA: Diagnosis not present

## 2024-10-04 DIAGNOSIS — Z5112 Encounter for antineoplastic immunotherapy: Secondary | ICD-10-CM | POA: Insufficient documentation

## 2024-10-04 DIAGNOSIS — Z803 Family history of malignant neoplasm of breast: Secondary | ICD-10-CM | POA: Insufficient documentation

## 2024-10-04 DIAGNOSIS — Z808 Family history of malignant neoplasm of other organs or systems: Secondary | ICD-10-CM | POA: Insufficient documentation

## 2024-10-04 LAB — CBC WITH DIFFERENTIAL (CANCER CENTER ONLY)
Abs Immature Granulocytes: 0.02 K/uL (ref 0.00–0.07)
Basophils Absolute: 0 K/uL (ref 0.0–0.1)
Basophils Relative: 1 %
Eosinophils Absolute: 0.2 K/uL (ref 0.0–0.5)
Eosinophils Relative: 3 %
HCT: 30.6 % — ABNORMAL LOW (ref 36.0–46.0)
Hemoglobin: 9.8 g/dL — ABNORMAL LOW (ref 12.0–15.0)
Immature Granulocytes: 0 %
Lymphocytes Relative: 21 %
Lymphs Abs: 1.3 K/uL (ref 0.7–4.0)
MCH: 28.1 pg (ref 26.0–34.0)
MCHC: 32 g/dL (ref 30.0–36.0)
MCV: 87.7 fL (ref 80.0–100.0)
Monocytes Absolute: 0.7 K/uL (ref 0.1–1.0)
Monocytes Relative: 12 %
Neutro Abs: 3.9 K/uL (ref 1.7–7.7)
Neutrophils Relative %: 63 %
Platelet Count: 220 K/uL (ref 150–400)
RBC: 3.49 MIL/uL — ABNORMAL LOW (ref 3.87–5.11)
RDW: 14.1 % (ref 11.5–15.5)
WBC Count: 6.1 K/uL (ref 4.0–10.5)
nRBC: 0 % (ref 0.0–0.2)

## 2024-10-04 LAB — CMP (CANCER CENTER ONLY)
ALT: 15 U/L (ref 0–44)
AST: 21 U/L (ref 15–41)
Albumin: 4 g/dL (ref 3.5–5.0)
Alkaline Phosphatase: 52 U/L (ref 38–126)
Anion gap: 11 (ref 5–15)
BUN: 25 mg/dL — ABNORMAL HIGH (ref 8–23)
CO2: 25 mmol/L (ref 22–32)
Calcium: 10.1 mg/dL (ref 8.9–10.3)
Chloride: 105 mmol/L (ref 98–111)
Creatinine: 1 mg/dL (ref 0.44–1.00)
GFR, Estimated: >60 mL/min (ref >60)
Glucose, Bld: 117 mg/dL — ABNORMAL HIGH (ref 70–99)
Potassium: 3.5 mmol/L (ref 3.5–5.1)
Sodium: 140 mmol/L (ref 135–145)
Total Bilirubin: 0.3 mg/dL (ref 0.0–1.2)
Total Protein: 8.2 g/dL — ABNORMAL HIGH (ref 6.5–8.1)

## 2024-10-04 MED ORDER — ACETAMINOPHEN 325 MG PO TABS
650.0000 mg | ORAL_TABLET | Freq: Once | ORAL | Status: AC
  Administered 2024-10-04: 10:00:00 650 mg via ORAL
  Filled 2024-10-04: qty 2

## 2024-10-04 MED ORDER — DIPHENHYDRAMINE HCL 25 MG PO CAPS
25.0000 mg | ORAL_CAPSULE | Freq: Once | ORAL | Status: AC
  Administered 2024-10-04: 10:00:00 25 mg via ORAL
  Filled 2024-10-04: qty 1

## 2024-10-04 MED ORDER — TRASTUZUMAB-ANNS CHEMO 150 MG IV SOLR
6.0000 mg/kg | Freq: Once | INTRAVENOUS | Status: AC
  Administered 2024-10-04: 10:00:00 600 mg via INTRAVENOUS
  Filled 2024-10-04: qty 28.57

## 2024-10-04 MED ORDER — SODIUM CHLORIDE 0.9 % IV SOLN: Freq: Once | INTRAVENOUS | Status: AC

## 2024-10-04 NOTE — Patient Instructions (Signed)
 [facility]  Discharge Instructions: Thank you for choosing [facility] to provide your oncology and hematology care.   If you have a lab appointment with the Cancer Center, please go directly to the Cancer Center and check in at the registration area.   Wear comfortable clothing and clothing appropriate for easy access to any Portacath or PICC line.   We strive to give you quality time with your provider. You may need to reschedule your appointment if you arrive late (15 or more minutes).  Arriving late affects you and other patients whose appointments are after yours.  Also, if you miss three or more appointments without notifying the office, you may be dismissed from the clinic at the provider's discretion.      For prescription refill requests, have your pharmacy contact our office and allow 72 hours for refills to be completed.    Today you received the following chemotherapy and/or immunotherapy agents: Trastuzumab      To help prevent nausea and vomiting after your treatment, we encourage you to take your nausea medication as directed.  BELOW ARE SYMPTOMS THAT SHOULD BE REPORTED IMMEDIATELY: *FEVER GREATER THAN 100.4 F (38 C) OR HIGHER *CHILLS OR SWEATING *NAUSEA AND VOMITING THAT IS NOT CONTROLLED WITH YOUR NAUSEA MEDICATION *UNUSUAL SHORTNESS OF BREATH *UNUSUAL BRUISING OR BLEEDING *URINARY PROBLEMS (pain or burning when urinating, or frequent urination) *BOWEL PROBLEMS (unusual diarrhea, constipation, pain near the anus) TENDERNESS IN MOUTH AND THROAT WITH OR WITHOUT PRESENCE OF ULCERS (sore throat, sores in mouth, or a toothache) UNUSUAL RASH, SWELLING OR PAIN  UNUSUAL VAGINAL DISCHARGE OR ITCHING   Items with * indicate a potential emergency and should be followed up as soon as possible or go to the Emergency Department if any problems should occur.  Please show the CHEMOTHERAPY ALERT CARD or  IMMUNOTHERAPY ALERT CARD at check-in to the Emergency Department and triage nurse.  Should you have questions after your visit or need to cancel or reschedule your appointment, please contact [facility]  Dept: [phone]  and follow the prompts.  Office hours are 8:00 a.m. to 4:30 p.m. Monday - Friday. Please note that voicemails left after 4:00 p.m. may not be returned until the following business day.  We are closed weekends and major holidays. You have access to a nurse at all times for urgent questions. Please call the main number to the clinic Dept: [phone] and follow the prompts.   For any non-urgent questions, you may also contact your provider using MyChart. We now offer e-Visits for anyone 62 and older to request care online for non-urgent symptoms. For details visit [email or web address].   Also download the MyChart app! Go to the app store, search "MyChart", open the app, select [place], and log in with your MyChart username and password.

## 2024-10-04 NOTE — Progress Notes (Signed)
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   Humiliation, Afraid, Rape, and Kick questionnaire    Fear of Current or Ex-Partner: No    Emotionally Abused: No    Physically Abused: No    Sexually Abused: No  Depression (PHQ2-9): Low Risk (09/13/2024)   Depression (PHQ2-9)    PHQ-2 Score: 0  Recent Concern: Depression (PHQ2-9) - Medium Risk (07/12/2024)   Depression (PHQ2-9)    PHQ-2 Score: 9  Alcohol Screen: Not on file  Housing: Low Risk (07/10/2024)   Received from [facility]   Epic    What is your living situation today?: I have a steady place to live    Think about the place you live. Do you have problems with any of the following? Choose all that apply:: None/None on this list  Utilities: Low Risk (07/10/2024)   Received from [facility]   Utilities    In the past 12 months has the electric, gas, oil, or water  company threatened to shut off services in your home? : No  Health Literacy: Adequate Health Literacy (06/15/2023)   B1300 Health Literacy     Frequency of need for help with medical instructions: Never    FAMILY HISTORY: Family History  Problem Relation Age of Onset   Diabetes Mother    Hypertension Mother    Ovarian cancer Mother    Heart disease Father        COPD   Alcohol abuse Father        ETOH dependence   Breast cancer Maternal Aunt        dx in her 96s   Lung cancer Maternal Uncle    Breast cancer Paternal Aunt    Cancer Maternal Grandmother        salivary gland cancer   Colon cancer Neg Hx    Colon polyps Neg Hx    Esophageal cancer Neg Hx    Stomach cancer Neg Hx    Rectal cancer Neg Hx     Review of Systems  Constitutional:  Negative for appetite change, chills, fatigue, fever and unexpected weight change.  HENT:   Negative for hearing loss, lump/mass and trouble swallowing.   Eyes:  Negative for eye problems and icterus.  Respiratory:  Negative for chest tightness, cough and shortness of breath.   Cardiovascular:  Negative for chest pain, leg swelling and palpitations.  Gastrointestinal:  Negative for abdominal distention, abdominal pain, constipation,  diarrhea, nausea and vomiting.  Endocrine: Negative for hot flashes.  Genitourinary:  Negative for difficulty urinating.   Musculoskeletal:  Negative for arthralgias.  Skin:  Negative for itching and rash.  Neurological:  Negative for dizziness, extremity weakness, headaches and numbness.  Hematological:  Negative for adenopathy. Does not bruise/bleed easily.  Psychiatric/Behavioral:  Negative for depression. The patient is not nervous/anxious.       PHYSICAL EXAMINATION  BP 129/88 (BP Location: Right Wrist, Patient Position: Sitting)   Pulse 67   Temp 98.1 F (36.7 C) (Temporal)   Resp 18   Wt 238 lb 1.6 oz (108 kg)   LMP 05/18/2016   SpO2 99%   BMI 48.09 kg/m    Physical Exam Constitutional:      General: She is not in acute distress.    Appearance: Normal appearance. She is not toxic-appearing.  HENT:     Head: Normocephalic and  atraumatic.     Mouth/Throat:     Mouth: Mucous membranes are moist.     Pharynx: Oropharynx is clear. No oropharyngeal exudate or posterior oropharyngeal erythema.  Eyes:     General: No scleral icterus. Cardiovascular:     Rate and Rhythm: Normal rate and regular rhythm.     Pulses: Normal pulses.     Heart sounds: Normal heart sounds.  Pulmonary:     Effort: Pulmonary effort is normal.     Breath sounds: No wheezing.  Abdominal:     General: Abdomen is flat. Bowel sounds are normal. There is no distension.     Palpations: Abdomen is soft.     Tenderness: There is no abdominal tenderness.  Musculoskeletal:        General: Swelling (BLE noted) present.     Cervical back: Neck supple.  Lymphadenopathy:     Cervical: No cervical adenopathy.  Skin:    General: Skin is warm and dry.     Findings: No rash.  Neurological:     General: No focal deficit present.     Mental Status: She is alert.     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        Behavior: Behavior normal.     LABORATORY DATA:  CBC    Component Value Date/Time   WBC 5.4 09/13/2024 0826   WBC 6.4 09/27/2020 1114   RBC 3.64 (L) 09/13/2024 0826   HGB 10.3 (L) 09/13/2024 0826   HGB 11.9 03/30/2017 1044   HCT 32.0 (L) 09/13/2024 0826   HCT 37.4 03/30/2017 1044   PLT 230 09/13/2024 0826   PLT 238 03/30/2017 1044   MCV 87.9 09/13/2024 0826   MCV 85.0 03/30/2017 1044   MCH 28.3 09/13/2024 0826   MCHC 32.2 09/13/2024 0826   RDW 13.7 09/13/2024 0826   RDW 15.1 (H) 03/30/2017 1044   LYMPHSABS 1.3 09/13/2024 0826   LYMPHSABS 1.3 03/30/2017 1044   MONOABS 0.6 09/13/2024 0826   MONOABS 0.5 03/30/2017 1044   EOSABS 0.2 09/13/2024 0826   EOSABS 0.0 03/30/2017 1044   BASOSABS 0.0 09/13/2024 0826   BASOSABS 0.0 03/30/2017 1044    CMP     Component Value Date/Time   NA 141 09/13/2024 0826   NA 143 03/30/2017  1044   K 3.2 (L) 09/13/2024 0826   K 3.8 03/30/2017 1044   CL 102 09/13/2024 0826   CL 106 01/18/2013 0909   CO2  28 09/13/2024 0826   CO2 27 03/30/2017 1044   GLUCOSE 112 (H) 09/13/2024 0826   GLUCOSE 91 03/30/2017 1044   GLUCOSE 99 01/18/2013 0909   BUN 29 (H) 09/13/2024 0826   BUN 9.7 03/30/2017 1044   CREATININE 1.15 (H) 09/13/2024 0826   CREATININE 0.8 03/30/2017 1044   CALCIUM  9.8 09/13/2024 0826   CALCIUM  9.7 03/30/2017 1044   PROT 8.2 (H) 09/13/2024 0826   PROT 8.0 03/30/2017 1044   ALBUMIN 4.1 09/13/2024 0826   ALBUMIN 3.8 01/27/2021 1232   ALBUMIN 3.4 (L) 03/30/2017 1044   AST 24 09/13/2024 0826   AST 17 03/30/2017 1044   ALT 17 09/13/2024 0826   ALT 15 03/30/2017 1044   ALKPHOS 53 09/13/2024 0826   ALKPHOS 60 03/30/2017 1044   BILITOT 0.4 09/13/2024 0826   BILITOT 0.34 03/30/2017 1044   GFRNONAA 53 (L) 09/13/2024 0826   GFRAA >60 07/11/2020 0923    ASSESSMENT and THERAPY PLAN:   Assessment and Plan Assessment & Plan Metastatic Breast cancer on maintenance herceptin  therapy Herceptin  therapy ongoing. PET scan every six months for monitoring. Echocardiogram due in early November. - Continue Herceptin  therapy. 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 No new or worsening neuropathic symptoms.  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 Normal white blood cell and platelet counts. Renal and hepatic function normal. - Reviewed recent blood work including hemoglobin, white blood cell count, platelet count, renal and hepatic function.  All questions were answered. The patient knows to call the clinic with any problems, questions or concerns. We can certainly see the patient much sooner if necessary.  Total encounter time:30 minutes*in face-to-face visit time, chart review, lab review, care  coordination, order entry, and documentation of the encounter time.   *Total Encounter Time as defined by the Centers for Medicare and Medicaid Services includes, in addition to the face-to-face time of a patient visit (documented in the note above) non-face-to-face time: obtaining and reviewing outside history, ordering and reviewing medications, tests or procedures, care coordination (communications with other health care professionals or caregivers) and documentation in the medical record.

## 2024-10-13 ENCOUNTER — Ambulatory Visit: Payer: Self-pay

## 2024-10-13 NOTE — Telephone Encounter (Signed)
 FYI Only or Action Required?: FYI only for provider: UC advised.  Patient was last seen in primary care on 06/21/2024 by [name] ORN, MD.  Called Nurse Triage reporting Cough.  Symptoms began yesterday.  Interventions attempted: Nothing.  Symptoms are: unchanged.  Triage Disposition: See HCP Within 4 Hours (Or PCP Triage)  Patient/caregiver understands and will follow disposition?:    Reason for Disposition  Wheezing is present  Answer Assessment - Initial Assessment Questions Has breast cancer. No appointments available till Monday. Patient told to go to UC.  1. ONSET: When did the cough begin?      10/12/2024 3. SPUTUM: Describe the color of your sputum (e.g., none, dry cough; clear, white, yellow, green)     Unsure, light was off 5. DIFFICULTY BREATHING: Are you having difficulty breathing? If Yes, ask: How bad is it? (e.g., mild, moderate, severe)      Doesn't notice anything different other than the rattling I'm hearing 6. FEVER: Do you have a fever? If Yes, ask: What is your temperature, how was it measured, and when did it start?     Denies 7. CARDIAC HISTORY: Do you have any history of heart disease? (e.g., heart attack, congestive heart failure)      HTN 8. LUNG HISTORY: Do you have any history of lung disease?  (e.g., pulmonary embolus, asthma, emphysema)     Denies, says she saw a pulmonologist and they said she did not have asthma so she stopped albuterol  9. PE RISK FACTORS: Do you have a history of blood clots? (or: recent major surgery, recent prolonged travel, bedridden)     Yes, on blood thinner 10. OTHER SYMPTOMS: Do you have any other symptoms? (e.g., runny nose, wheezing, chest pain)       Rattling in chest, irritated throat, headache, body aches.  Protocols used: Cough - Acute Non-Productive-A-AH

## 2024-10-13 NOTE — Telephone Encounter (Signed)
 Copied from CRM #[id number]. Topic: Clinical - Red Word Triage >> Oct 13, 2024 12:33 PM Ashley R wrote: Red Word that prompted transfer to Nurse Triage: rattling when breathing, especially when laying down, cough not prodictive, but throat

## 2024-10-16 ENCOUNTER — Other Ambulatory Visit: Payer: Self-pay | Admitting: Internal Medicine

## 2024-10-16 ENCOUNTER — Other Ambulatory Visit: Payer: Self-pay

## 2024-10-19 ENCOUNTER — Encounter: Payer: Self-pay | Admitting: Hematology and Oncology

## 2024-10-20 ENCOUNTER — Ambulatory Visit (INDEPENDENT_AMBULATORY_CARE_PROVIDER_SITE_OTHER): Admitting: Podiatry

## 2024-10-20 ENCOUNTER — Encounter: Payer: Self-pay | Admitting: Podiatry

## 2024-10-20 ENCOUNTER — Ambulatory Visit: Payer: Self-pay

## 2024-10-20 ENCOUNTER — Ambulatory Visit

## 2024-10-20 ENCOUNTER — Telehealth: Payer: Self-pay

## 2024-10-20 ENCOUNTER — Encounter: Payer: Self-pay | Admitting: Hematology and Oncology

## 2024-10-20 VITALS — BP 154/97 | HR 67 | Ht 59.0 in | Wt 239.0 lb

## 2024-10-20 DIAGNOSIS — M76821 Posterior tibial tendinitis, right leg: Secondary | ICD-10-CM

## 2024-10-20 DIAGNOSIS — B351 Tinea unguium: Secondary | ICD-10-CM

## 2024-10-20 DIAGNOSIS — M79675 Pain in left toe(s): Secondary | ICD-10-CM | POA: Diagnosis not present

## 2024-10-20 DIAGNOSIS — Z0000 Encounter for general adult medical examination without abnormal findings: Secondary | ICD-10-CM | POA: Diagnosis not present

## 2024-10-20 DIAGNOSIS — E1142 Type 2 diabetes mellitus with diabetic polyneuropathy: Secondary | ICD-10-CM | POA: Diagnosis not present

## 2024-10-20 DIAGNOSIS — M79674 Pain in right toe(s): Secondary | ICD-10-CM

## 2024-10-20 NOTE — Patient Instructions (Signed)
 Recommend looking for supportive over-the-counter shoes such as SAS or Dr. Minor.  Other good and widely available shoe brands for arch support include [place], Hoka's, ASICS, new balance, Altra's and Saucony

## 2024-10-20 NOTE — Telephone Encounter (Signed)
 FYI Only or Action Required?: FYI only for provider: appointment scheduled on 10/23/24.  Patient was last seen in primary care on 06/21/2024 by [name] ORN, MD.  Called Nurse Triage reporting Cough.  Symptoms began a week ago.  Interventions attempted: Prescription medications: hydrocodone  cough syrup.  Symptoms are: unchanged.  Triage Disposition: See Physician Within 24 Hours  Patient/caregiver understands and will follow disposition?: Yes        Copied from CRM [id number]. Topic: Clinical - Red Word Triage >> Oct 20, 2024  4:06 PM Rea ORN wrote: Red Word that prompted transfer to Nurse Triage: productive cough, wheezing at night Reason for Disposition  [1] Continuous (nonstop) coughing interferes with work or school AND [2] no improvement using cough treatment per Care Advice  Answer Assessment - Initial Assessment Questions 1. ONSET: When did the cough begin?      X 1 week   2. SEVERITY: How bad is the cough today?      Intermittent   3. SPUTUM: Describe the color of your sputum (e.g., none, dry cough; clear, white, yellow, green)     White   4. HEMOPTYSIS: Are you coughing up any blood? If Yes, ask: How much? (e.g., flecks, streaks, tablespoons, etc.)     No   5. DIFFICULTY BREATHING: Are you having difficulty breathing? If Yes, ask: How bad is it? (e.g., mild, moderate, severe)      No   6. FEVER: Do you have a fever? If Yes, ask: What is your temperature, how was it measured, and when did it start?     No   7. CARDIAC HISTORY: Do you have any history of heart disease? (e.g., heart attack, congestive heart failure)      HTN   8. LUNG HISTORY: Do you have any history of lung disease?  (e.g., pulmonary embolus, asthma, emphysema)     OSA   10. OTHER SYMPTOMS: Do you have any other symptoms? (e.g., runny nose, wheezing, chest pain)       Runny nose     Patient called in to triage with complaints of productive cough, wheezing at  night. This has been ongoing for one week.  The patient stated Sore throat noted as well, wheezing has resolved. Mucus is white.    For home care, the patient is taking prescribed hydrocodone  cough syrup from previous visit.    Appointment scheduled for further evaluation; and agrees with the plan of care, and will reach out if symptoms worsen or persist.  Protocols used: Cough - Acute Productive-A-AH

## 2024-10-20 NOTE — Telephone Encounter (Signed)
 Patient here today for a AWV.  She is asking for something to take in regaurds to the message that she left on 10/13/2024.  Please see below.  She thought that she was going to see provider today, however, it was just for this visit.     FYI Only or Action Required?: FYI only for provider: UC advised.   Patient was last seen in primary care on 06/21/2024 by [name] ORN, MD.   Called Nurse Triage reporting Cough.   Symptoms began yesterday.   Interventions attempted: Nothing.   Symptoms are: unchanged.   Triage Disposition: See HCP Within 4 Hours (Or PCP Triage)   Patient/caregiver understands and will follow disposition?:      Reason for Disposition  Wheezing is present

## 2024-10-20 NOTE — Patient Instructions (Addendum)
 [name],  Thank you for taking the time for your Medicare Wellness Visit. I appreciate your continued commitment to your health goals. Please review the care plan we discussed, and feel free to reach out if I can assist you further.  Please note that Annual Wellness Visits do not include a physical exam. Some assessments may be limited, especially if the visit was conducted virtually. If needed, we may recommend an in-person follow-up with your provider.  Ongoing Care Seeing your primary care provider every 3 to 6 months helps us  monitor your health and provide consistent, personalized care. Last office visit on 06/21/2024.  Remember to discuss a bone density with oncology.  You are due for a Flu vaccine and also Shingles vaccine.  These can be given at your local pharmacy.  You are due for a kidney evaluation(urine sample), order was placed today.  I have placed a message to your provider for a refill today.  Please call office to get scheduled for your next office visit.    Referrals If a referral was made during today's visit and you haven't received any updates within two weeks, please contact the referred provider directly to check on the status.  Recommended Screenings:  Health Maintenance  Topic Date Due   Yearly kidney health urinalysis for diabetes  Never done   Zoster (Shingles) Vaccine (1 of 2) Never done   Osteoporosis screening with Bone Density Scan  Never done   Medicare Annual Wellness Visit  06/14/2024   Breast Cancer Screening  11/28/2024   Flu Shot  02/16/2025*   Complete foot exam   11/08/2024   Hemoglobin A1C  11/16/2024   Eye exam for diabetics  06/12/2025   Yearly kidney function blood test for diabetes  10/04/2025   DTaP/Tdap/Td vaccine (4 - Td or Tdap) 07/10/2032   Colon Cancer Screening  09/21/2032   Pneumococcal Vaccine for age over 56  Completed   Hepatitis C Screening  Completed   Meningitis B Vaccine  Aged Out   COVID-19 Vaccine  Discontinued   *Topic was postponed. The date shown is not the original due date.       10/20/2024   11:54 AM  Advanced Directives  Does Patient Have a Medical Advance Directive? Yes  Type of Estate Agent of [place];Living will  Copy of Healthcare Power of Attorney in Chart? Yes - validated most recent copy scanned in chart (See row information)    Vision: Annual vision screenings are recommended for early detection of glaucoma, cataracts, and diabetic retinopathy. These exams can also reveal signs of chronic conditions such as diabetes and high blood pressure.  Dental: Annual dental screenings help detect early signs of oral cancer, gum disease, and other conditions linked to overall health, including heart disease and diabetes.  Please see the attached documents for additional preventive care recommendations.

## 2024-10-20 NOTE — Progress Notes (Signed)
"  °  Subjective:  Patient ID: [name], female    DOB: Sep 19, 1956,  MRN: [id number]  Chief Complaint  Patient presents with   Nail Problem    Thick painful toenails, 3 month follow up    Peripheral Neuropathy   Tendonitis    PTTD right   Diabetes    A1C  6.0    69 y.o. female presents with the above complaint. History confirmed with patient. Patient presenting with pain related to dystrophic thickened elongated nails. Patient is unable to trim own nails related to nail dystrophy and/or mobility issues. Patient does have a history of T2DM.  Diabetes well-controlled, last A1c 6.0.  Right foot arch pain is doing a bit better today.  She is reporting chronic difficulty with right foot stiffness, weakness, difficulty actively flexing toes.  Objective:  Physical Exam: warm, good capillary refill, decreased pedal hair growth nail exam onychomycosis of the toenails, onycholysis, and dystrophic nails greater than 3 mm thickening DP pulses palpable, PT pulses palpable, protective sensation absent, and vibratory sensation diminished Left Foot:  Pain with palpation of nails due to elongation and dystrophic growth.  Right Foot: Pain with palpation of nails due to elongation and dystrophic growth.  Right foot muscle strength decreased 4/5 in dorsiflexion, plantarflexion inversion and eversion  Assessment:   1. Posterior tibial tendon dysfunction (PTTD) of right lower extremity   2. Pain due to onychomycosis of toenails of both feet   3. Diabetic polyneuropathy associated with type 2 diabetes mellitus (HCC)      Plan:  Patient was evaluated and treated and all questions answered.   #Onychomycosis with pain  -Nails palliatively debrided as below. -Educated on self-care - Anticoagulated on Xarelto   Procedure: Nail Debridement Rationale: Pain Type of Debridement: manual, sharp debridement. Instrumentation: Nail nipper, rotary burr. Number of Nails: 8  Patient educated on  diabetes. Discussed proper diabetic foot care and discussed risks and complications of disease. Educated patient in depth on reasons to return to the office immediately should he/she discover anything concerning or new on the feet. All questions answered. Discussed proper shoes as well.   # Right foot PTTD - Reports doing well from a pain standpoint and this has improved. 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  Did discuss home stretching and strengthening exercises which may help improve right foot function and mobility. -Home exercise instructions dispensed to patient. -I certify that this diagnosis represents a distinct and separate diagnosis that requires evaluation and treatment separate from other procedures or diagnosis   Return in about 3 months (around 01/18/2025) for Diabetic Foot Care.         Ethan Saddler, DPM [facility] / [facility]                     "

## 2024-10-20 NOTE — Telephone Encounter (Signed)
 I agree, pt should go to UC especially if feverish or sob or pain or other unusual symptoms
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
## 2024-10-23 ENCOUNTER — Ambulatory Visit: Admitting: Family Medicine

## 2024-10-23 ENCOUNTER — Encounter: Payer: Self-pay | Admitting: Family Medicine

## 2024-10-23 VITALS — BP 128/84 | HR 64 | Temp 98.2°F | Resp 18 | Ht 59.0 in | Wt 238.0 lb

## 2024-10-23 DIAGNOSIS — Z171 Estrogen receptor negative status [ER-]: Secondary | ICD-10-CM

## 2024-10-23 DIAGNOSIS — J069 Acute upper respiratory infection, unspecified: Secondary | ICD-10-CM | POA: Diagnosis not present

## 2024-10-23 MED ORDER — PREDNISONE 20 MG PO TABS: 40.0000 mg | ORAL_TABLET | Freq: Every day | ORAL | 0 refills | Status: AC

## 2024-10-23 MED ORDER — BENZONATATE 200 MG PO CAPS: 200.0000 mg | ORAL_CAPSULE | Freq: Three times a day (TID) | ORAL | 0 refills | Status: AC | PRN

## 2024-10-23 NOTE — Telephone Encounter (Signed)
Pt was seen for OV today.

## 2024-10-24 DIAGNOSIS — Z171 Estrogen receptor negative status [ER-]: Secondary | ICD-10-CM

## 2024-10-25 ENCOUNTER — Encounter: Payer: Self-pay | Admitting: Hematology and Oncology

## 2024-10-25 ENCOUNTER — Inpatient Hospital Stay: Attending: Hematology and Oncology

## 2024-10-25 ENCOUNTER — Inpatient Hospital Stay

## 2024-10-25 VITALS — BP 128/87 | HR 71 | Temp 98.7°F | Resp 16 | Ht 59.0 in | Wt 235.8 lb

## 2024-10-25 DIAGNOSIS — Z923 Personal history of irradiation: Secondary | ICD-10-CM | POA: Insufficient documentation

## 2024-10-25 DIAGNOSIS — Z86718 Personal history of other venous thrombosis and embolism: Secondary | ICD-10-CM | POA: Insufficient documentation

## 2024-10-25 DIAGNOSIS — C50812 Malignant neoplasm of overlapping sites of left female breast: Secondary | ICD-10-CM | POA: Insufficient documentation

## 2024-10-25 DIAGNOSIS — Z171 Estrogen receptor negative status [ER-]: Secondary | ICD-10-CM

## 2024-10-25 DIAGNOSIS — Z170 Estrogen receptor positive status [ER+]: Secondary | ICD-10-CM | POA: Insufficient documentation

## 2024-10-25 DIAGNOSIS — Z1722 Progesterone receptor negative status: Secondary | ICD-10-CM | POA: Insufficient documentation

## 2024-10-25 DIAGNOSIS — Z1731 Human epidermal growth factor receptor 2 positive status: Secondary | ICD-10-CM | POA: Insufficient documentation

## 2024-10-25 DIAGNOSIS — Z1721 Progesterone receptor positive status: Secondary | ICD-10-CM | POA: Insufficient documentation

## 2024-10-25 DIAGNOSIS — Z8542 Personal history of malignant neoplasm of other parts of uterus: Secondary | ICD-10-CM | POA: Insufficient documentation

## 2024-10-25 DIAGNOSIS — C50912 Malignant neoplasm of unspecified site of left female breast: Secondary | ICD-10-CM | POA: Insufficient documentation

## 2024-10-25 DIAGNOSIS — D630 Anemia in neoplastic disease: Secondary | ICD-10-CM | POA: Insufficient documentation

## 2024-10-25 DIAGNOSIS — C50112 Malignant neoplasm of central portion of left female breast: Secondary | ICD-10-CM

## 2024-10-25 DIAGNOSIS — C773 Secondary and unspecified malignant neoplasm of axilla and upper limb lymph nodes: Secondary | ICD-10-CM | POA: Insufficient documentation

## 2024-10-25 DIAGNOSIS — Z5112 Encounter for antineoplastic immunotherapy: Secondary | ICD-10-CM | POA: Insufficient documentation

## 2024-10-25 LAB — CBC WITH DIFFERENTIAL (CANCER CENTER ONLY)
Abs Immature Granulocytes: 0.02 K/uL (ref 0.00–0.07)
Basophils Absolute: 0 K/uL (ref 0.0–0.1)
Basophils Relative: 0 %
Eosinophils Absolute: 0.1 K/uL (ref 0.0–0.5)
Eosinophils Relative: 2 %
HCT: 31.9 % — ABNORMAL LOW (ref 36.0–46.0)
Hemoglobin: 10.3 g/dL — ABNORMAL LOW (ref 12.0–15.0)
Immature Granulocytes: 0 %
Lymphocytes Relative: 22 %
Lymphs Abs: 1.4 K/uL (ref 0.7–4.0)
MCH: 28.2 pg (ref 26.0–34.0)
MCHC: 32.3 g/dL (ref 30.0–36.0)
MCV: 87.4 fL (ref 80.0–100.0)
Monocytes Absolute: 0.6 K/uL (ref 0.1–1.0)
Monocytes Relative: 10 %
Neutro Abs: 4 K/uL (ref 1.7–7.7)
Neutrophils Relative %: 66 %
Platelet Count: 280 K/uL (ref 150–400)
RBC: 3.65 MIL/uL — ABNORMAL LOW (ref 3.87–5.11)
RDW: 14.6 % (ref 11.5–15.5)
WBC Count: 6.2 K/uL (ref 4.0–10.5)
nRBC: 0 % (ref 0.0–0.2)

## 2024-10-25 LAB — CMP (CANCER CENTER ONLY)
ALT: 19 U/L (ref 0–44)
AST: 23 U/L (ref 15–41)
Albumin: 4 g/dL (ref 3.5–5.0)
Alkaline Phosphatase: 47 U/L (ref 38–126)
Anion gap: 10 (ref 5–15)
BUN: 26 mg/dL — ABNORMAL HIGH (ref 8–23)
CO2: 26 mmol/L (ref 22–32)
Calcium: 10 mg/dL (ref 8.9–10.3)
Chloride: 104 mmol/L (ref 98–111)
Creatinine: 1.1 mg/dL — ABNORMAL HIGH (ref 0.44–1.00)
GFR, Estimated: 55 mL/min — ABNORMAL LOW
Glucose, Bld: 117 mg/dL — ABNORMAL HIGH (ref 70–99)
Potassium: 3.6 mmol/L (ref 3.5–5.1)
Sodium: 141 mmol/L (ref 135–145)
Total Bilirubin: 0.4 mg/dL (ref 0.0–1.2)
Total Protein: 8.2 g/dL — ABNORMAL HIGH (ref 6.5–8.1)

## 2024-10-25 MED ORDER — ACETAMINOPHEN 325 MG PO TABS
650.0000 mg | ORAL_TABLET | Freq: Once | ORAL | Status: AC
  Administered 2024-10-25: 650 mg via ORAL
  Filled 2024-10-25: qty 2

## 2024-10-25 MED ORDER — SODIUM CHLORIDE 0.9 % IV SOLN: Freq: Once | INTRAVENOUS | Status: AC

## 2024-10-25 MED ORDER — DIPHENHYDRAMINE HCL 25 MG PO CAPS
25.0000 mg | ORAL_CAPSULE | Freq: Once | ORAL | Status: AC
  Administered 2024-10-25: 25 mg via ORAL
  Filled 2024-10-25: qty 1

## 2024-10-25 MED ORDER — TRASTUZUMAB-ANNS CHEMO 150 MG IV SOLR
6.0000 mg/kg | Freq: Once | INTRAVENOUS | Status: AC
  Administered 2024-10-25: 600 mg via INTRAVENOUS
  Filled 2024-10-25: qty 28.57

## 2024-10-25 NOTE — Patient Instructions (Addendum)
 [facility] - A DEPT OF [facility]  Discharge Instructions: Thank you for choosing [facility] to provide your oncology and hematology care.   If you have a lab appointment with the Cancer Center, please go directly to the Cancer Center and check in at the registration area.   Wear comfortable clothing and clothing appropriate for easy access to any Portacath or PICC line.   We strive to give you quality time with your provider. You may need to reschedule your appointment if you arrive late (15 or more minutes).  Arriving late affects you and other patients whose appointments are after yours.  Also, if you miss three or more appointments without notifying the office, you may be dismissed from the clinic at the providers discretion.      For prescription refill requests, have your pharmacy contact our office and allow 72 hours for refills to be completed.    Today you received the following chemotherapy and/or immunotherapy agents: Trastuzumab  -anns (KANJINTI )     To help prevent nausea and vomiting after your treatment, we encourage you to take your nausea medication as directed.  BELOW ARE SYMPTOMS THAT SHOULD BE REPORTED IMMEDIATELY: *FEVER GREATER THAN 100.4 F (38 C) OR HIGHER *CHILLS OR SWEATING *NAUSEA AND VOMITING THAT IS NOT CONTROLLED WITH YOUR NAUSEA MEDICATION *UNUSUAL SHORTNESS OF BREATH *UNUSUAL BRUISING OR BLEEDING *URINARY PROBLEMS (pain or burning when urinating, or frequent urination) *BOWEL PROBLEMS (unusual diarrhea, constipation, pain near the anus) TENDERNESS IN MOUTH AND THROAT WITH OR WITHOUT PRESENCE OF ULCERS (sore throat, sores in mouth, or a toothache) UNUSUAL RASH, SWELLING OR PAIN  UNUSUAL VAGINAL DISCHARGE OR ITCHING   Items with * indicate a potential emergency and should be followed up as soon as possible or go to the Emergency Department if any problems should occur.  Please show the CHEMOTHERAPY ALERT CARD  or IMMUNOTHERAPY ALERT CARD at check-in to the Emergency Department and triage nurse.  Should you have questions after your visit or need to cancel or reschedule your appointment, please contact [facility] - A DEPT OF [facility]  Dept: [phone]  and follow the prompts.  Office hours are 8:00 a.m. to 4:30 p.m. Monday - Friday. Please note that voicemails left after 4:00 p.m. may not be returned until the following business day.  We are closed weekends and major holidays. You have access to a nurse at all times for urgent questions. Please call the main number to the clinic Dept: [phone] and follow the prompts.   For any non-urgent questions, you may also contact your provider using MyChart. We now offer e-Visits for anyone 71 and older to request care online for non-urgent symptoms. For details visit [email or web address].   Also download the MyChart app! Go to the app store, search MyChart, open the app, select [place], and log in with your MyChart username and password.

## 2024-10-28 NOTE — Progress Notes (Signed)
 "  Assessment & Plan Viral URI Acute upper respiratory infection Likely viral, possibly influenza. No antibiotics needed. Normal pulmonary function, no asthma. - Prescribed prednisone  for lung inflammation. - Prescribed Tessalon  Perles for cough, three times daily as needed. - Advised albuterol  inhaler for wheezing or shortness of breath. Orders:   predniSONE  (DELTASONE ) 20 MG tablet; Take 2 tablets (40 mg total) by mouth daily for 5 days.   benzonatate  (TESSALON ) 200 MG capsule; Take 1 capsule (200 mg total) by mouth 3 (three) times daily as needed for cough.    Follow up plan: Return in about 24 days (around 11/16/2024) for chronic follow-up with new PCP .  Niki Rung, MSN, APRN, FNP-C  Subjective:  HPI: [name] is a 69 y.o. female presenting on 10/23/2024 for Cough (Cough X approx 10 days - congestion= cloudy and white, wheezing at times, runny nose )  Discussed the use of AI scribe software for clinical note transcription with the patient, who gave verbal consent to proceed.  She has been experiencing a persistent cough and congestion for the past ten days. The cough is occasionally productive of phlegm, and she sometimes hears a 'rattling' sound at night. She needs to blow her nose occasionally, but not frequently.  Initially, she took hydrocodone  homatropine, despite a known reaction to hydrocodone  causing drowsiness and nausea, using small amounts to manage her symptoms. She also took Clarinex , which she found helpful for breathing, although it was a few months old.  She has a history of being evaluated for asthma due to previous wheezing episodes. Pulmonary function tests were normal, and she was informed she does not have asthma. She still has albuterol  available for use if needed.       ROS: Negative unless specifically indicated above in HPI.   Relevant past medical history reviewed and updated as indicated.   Allergies and medications reviewed and  updated.  Current Medications[1]  Allergies[2]  Objective:   BP 128/84   Pulse 64   Temp 98.2 F (36.8 C)   Resp 18   Ht 4' 11 (1.499 m)   Wt 238 lb (108 kg)   LMP 05/18/2016   SpO2 98%   BMI 48.07 kg/m    Physical Exam Vitals reviewed.  Constitutional:      General: She is not in acute distress.    Appearance: Normal appearance. She is not ill-appearing, toxic-appearing or diaphoretic.  HENT:     Head: Normocephalic and atraumatic.     Right Ear: Tympanic membrane, ear canal and external ear normal. There is no impacted cerumen.     Left Ear: Tympanic membrane, ear canal and external ear normal. There is no impacted cerumen.     Nose: Congestion present. No rhinorrhea.     Right Sinus: No maxillary sinus tenderness or frontal sinus tenderness.     Left Sinus: No maxillary sinus tenderness or frontal sinus tenderness.     Mouth/Throat:     Mouth: Mucous membranes are moist.     Pharynx: Oropharynx is clear. No oropharyngeal exudate or posterior oropharyngeal erythema.  Eyes:     General: No scleral icterus.       Right eye: No discharge.        Left eye: No discharge.     Conjunctiva/sclera: Conjunctivae normal.  Cardiovascular:     Rate and Rhythm: Normal rate and regular rhythm.     Heart sounds: Normal heart sounds. No murmur heard.    No friction rub. No gallop.  Pulmonary:     Effort: Pulmonary effort is normal. No respiratory distress.     Breath sounds: Normal breath sounds. No stridor. No wheezing, rhonchi or rales.  Musculoskeletal:        General: Normal range of motion.     Cervical back: Normal range of motion.  Lymphadenopathy:     Cervical: No cervical adenopathy.  Skin:    General: Skin is warm and dry.     Capillary Refill: Capillary refill takes less than 2 seconds.  Neurological:     General: No focal deficit present.     Mental Status: She is alert and oriented to person, place, and time. Mental status is at baseline.  Psychiatric:         Mood and Affect: Mood normal.        Behavior: Behavior normal.        Thought Content: Thought content normal.        Judgment: Judgment normal.            [1]  Current Outpatient Medications:    acetaminophen  (TYLENOL ) 500 MG tablet, Take 500 mg by mouth every 6 (six) hours as needed for moderate pain (pain score 4-6)., Disp: , Rfl:    Ascorbic Acid  (VITAMIN C PO), Take 1 tablet by mouth daily., Disp: , Rfl:    benzonatate  (TESSALON ) 200 MG capsule, Take 1 capsule (200 mg total) by mouth 3 (three) times daily as needed for cough., Disp: 30 capsule, Rfl: 0   carvedilol  (COREG ) 6.25 MG tablet, TAKE 1 TABLET BY MOUTH TWICE A DAY, Disp: 60 tablet, Rfl: 11   Cyanocobalamin  (B-12 PO), Take 1 capsule by mouth daily., Disp: , Rfl:    cyclobenzaprine  (FLEXERIL ) 5 MG tablet, Take 1 tablet (5 mg total) by mouth at bedtime as needed for muscle spasms., Disp: 90 tablet, Rfl: 1   desloratadine  (CLARINEX ) 5 MG tablet, Take 5 mg by mouth as needed., Disp: , Rfl:    diclofenac  Sodium (VOLTAREN ) 1 % GEL, Apply 2 g topically 4 (four) times daily. Rub into affected area of foot 2 to 4 times daily, Disp: 100 g, Rfl: 2   furosemide  (LASIX ) 40 MG tablet, TAKE 1.5 TABLETS BY MOUTH DAILY., Disp: 45 tablet, Rfl: 5   ketoconazole  (NIZORAL ) 2 % cream, Apply 1 Application topically daily. (Patient taking differently: Apply 1 Application topically daily as needed.), Disp: 30 g, Rfl: 3   Magnesium  Oxide 400 MG CAPS, Take 1 capsule (400 mg total) by mouth daily., Disp: 30 capsule, Rfl: 11   Multiple Vitamins-Minerals (MULTIVITAMIN WITH MINERALS) tablet, Take 1 tablet by mouth daily., Disp: , Rfl:    nystatin  (MYCOSTATIN /NYSTOP ) powder, Apply 1 Application topically 3 (three) times daily., Disp: 60 g, Rfl: 0   OZEMPIC, 0.25 OR 0.5 MG/DOSE, 2 MG/3ML SOPN, Inject 0.25 mg into the skin once a week., Disp: , Rfl:    potassium chloride  SA (KLOR-CON  M) 20 MEQ tablet, TAKE 1 TABLET BY MOUTH TWICE A DAY, Disp: 60 tablet, Rfl:  8   predniSONE  (DELTASONE ) 20 MG tablet, Take 2 tablets (40 mg total) by mouth daily for 5 days., Disp: 10 tablet, Rfl: 0   telmisartan -hydrochlorothiazide  (MICARDIS  HCT) 80-12.5 MG tablet, TAKE 1 TABLET BY MOUTH EVERY DAY, Disp: 90 tablet, Rfl: 3   Wheat Dextrin (BENEFIBER) POWD, Take by mouth as needed., Disp: , Rfl:    XARELTO  20 MG TABS tablet, TAKE 1 TABLET BY MOUTH DAILY WITH SUPPER, Disp: 30 tablet, Rfl: 11   albuterol  (VENTOLIN   HFA) 108 (90 Base) MCG/ACT inhaler, Inhale 2 puffs into the lungs every 6 (six) hours as needed for wheezing or shortness of breath. (Patient not taking: Reported on 10/23/2024), Disp: 8 g, Rfl: 2   gabapentin  (NEURONTIN ) 300 MG capsule, Take 2 capsules (600 mg total) by mouth at bedtime as needed (pain). (Patient not taking: Reported on 10/23/2024), Disp: 60 capsule, Rfl: 1 No current facility-administered medications for this visit.  Facility-Administered Medications Ordered in Other Visits:    cloNIDine  (CATAPRES ) tablet 0.1 mg, 0.1 mg, Oral, Daily, Tanner, Van E., PA-C, 0.1 mg at 11/21/19 1742   heparin  lock flush 100 unit/mL, 500 Units, Intracatheter, Once, Iruku, Praveena, MD [2]  Allergies Allergen Reactions   Morphine And Codeine Nausea And Vomiting   Codeine Nausea Only   Darvon Nausea Only   Duloxetine  Other (See Comments)    duloxetine  hydrochloride   Duloxetine  Hcl Other (See Comments)    Head felt funny, ? thinking not right   Hydrocodone  Nausea Only   Oxycodone  Nausea Only   Pravastatin  Other (See Comments)    myalgias   Prevnar 20 [Pneumococcal 20-Val Conj Vacc] Dermatitis   Rosuvastatin  Other (See Comments)    Bone pain   "

## 2024-11-06 ENCOUNTER — Telehealth (HOSPITAL_COMMUNITY): Payer: Self-pay | Admitting: Cardiology

## 2024-11-06 NOTE — Telephone Encounter (Signed)
 Called to confirm/remind patient of their appointment [facility] on 11/06/2024.   Appointment:   [x] Confirmed  [] Left mess   [] No answer/No voice mail  [] VM Full/unable to leave message  [] Phone not in service  Patient reminded to bring all medications and/or complete list.  Confirmed patient has transportation. Gave directions, instructed to utilize valet parking.

## 2024-11-07 ENCOUNTER — Encounter (HOSPITAL_COMMUNITY): Payer: Self-pay | Admitting: Cardiology

## 2024-11-07 ENCOUNTER — Ambulatory Visit (HOSPITAL_COMMUNITY)
Admission: RE | Admit: 2024-11-07 | Discharge: 2024-11-07 | Disposition: A | Discharge status: Home / Self Care | Source: Ambulatory Visit | Attending: Cardiology | Admitting: Cardiology

## 2024-11-07 VITALS — BP 108/60 | HR 65 | Wt 242.0 lb

## 2024-11-07 DIAGNOSIS — I5032 Chronic diastolic (congestive) heart failure: Secondary | ICD-10-CM | POA: Insufficient documentation

## 2024-11-07 DIAGNOSIS — E669 Obesity, unspecified: Secondary | ICD-10-CM | POA: Diagnosis not present

## 2024-11-07 DIAGNOSIS — I429 Cardiomyopathy, unspecified: Secondary | ICD-10-CM | POA: Insufficient documentation

## 2024-11-07 DIAGNOSIS — E1141 Type 2 diabetes mellitus with diabetic mononeuropathy: Secondary | ICD-10-CM | POA: Diagnosis not present

## 2024-11-07 DIAGNOSIS — Z86718 Personal history of other venous thrombosis and embolism: Secondary | ICD-10-CM | POA: Diagnosis not present

## 2024-11-07 DIAGNOSIS — T451X5A Adverse effect of antineoplastic and immunosuppressive drugs, initial encounter: Secondary | ICD-10-CM | POA: Diagnosis not present

## 2024-11-07 DIAGNOSIS — I110 Hypertensive heart disease with heart failure: Secondary | ICD-10-CM | POA: Diagnosis not present

## 2024-11-07 DIAGNOSIS — R2681 Unsteadiness on feet: Secondary | ICD-10-CM | POA: Diagnosis not present

## 2024-11-07 DIAGNOSIS — E1142 Type 2 diabetes mellitus with diabetic polyneuropathy: Secondary | ICD-10-CM | POA: Insufficient documentation

## 2024-11-07 DIAGNOSIS — Z7901 Long term (current) use of anticoagulants: Secondary | ICD-10-CM | POA: Diagnosis not present

## 2024-11-07 DIAGNOSIS — G8929 Other chronic pain: Secondary | ICD-10-CM | POA: Insufficient documentation

## 2024-11-07 DIAGNOSIS — Z7985 Long-term (current) use of injectable non-insulin antidiabetic drugs: Secondary | ICD-10-CM | POA: Insufficient documentation

## 2024-11-07 DIAGNOSIS — Z6841 Body Mass Index (BMI) 40.0 and over, adult: Secondary | ICD-10-CM | POA: Diagnosis not present

## 2024-11-07 DIAGNOSIS — E785 Hyperlipidemia, unspecified: Secondary | ICD-10-CM | POA: Insufficient documentation

## 2024-11-07 DIAGNOSIS — Z79899 Other long term (current) drug therapy: Secondary | ICD-10-CM | POA: Insufficient documentation

## 2024-11-07 MED ORDER — FUROSEMIDE 40 MG PO TABS: 20.0000 mg | ORAL_TABLET | Freq: Every day | ORAL | 1 refills | Status: AC

## 2024-11-07 NOTE — Progress Notes (Signed)
 PCP: [name] ORN, MD Oncology: Dr. Loretha HF Cardiology: Dr. Rolan  69 y.o. with history of DM, HTN, hyperlipidemia, endometrial cancer, and breast cancer was referred by Dr. Layla for cardio-oncology evaluation.  Initial breast cancer diagnosis was in 2012.  She had left breast lumpectomy and radiation.  Recurrence in 7/20, ER+/PR-/HER2+.  She had chemotherapy with trastuzumab /pertuzumab /carboplatin /docetaxol x 6 starting 2/21.  With fall in EF in 4/21, Herceptin  was held.  Echo in 4/21 showed EF 45-50%, Coreg  was started. Echo in 6/21 showed EF back up to 55-60%, and Herceptin  was restarted.  In 6/21, she had lumpectomy.  Echo in 7/21 showed EF stable at 55-60%.    Echo in 9/21 showed EF 55-60% with normal RV, strain images did not track well.  Coronary CTA was done in 9/21, showing coronary artery calcium  score 0 and no significant CAD.    PET in 9/21 with new lymphadenopathy.  She was started on TDM-1/Kadcyla  but later changed back to Herceptin  which will continue for long-term.   In 12/21, she had a cervical discectomy and fusion.   Echo in 2/22 showed EF 55% with GLS -10.7% but poor endocardial tracing. Normal RV.  Echo in 4/22 showed EF 50-55%, mild LVH, normal RV, GLS -14.3% but poor endocardial tracing. Echo in 7/22 showed EF 55-60%, mild LVH, mild LAE, GLS -20.1%, normal RV, PASP 32 mmHg.  Echo in 10/22 showed EF 55-60%, mild LVH, GLS -20.1%, normal RV. Echo in 5/23 showed EF 55-60% with GLS -22.1%, normal RV.   Echo 11/23 showed EF 55% on my read (reported 50-55%), GLS -19.8%, normal RV.  Echo was done today and reviewed, technically difficult study with inaccurate strain imaging due to poor endocardial tracing, EF 55%, normal RV, normal IVC.   PYP scan in 3/24 was grade 2 with H/CL 1.08 (equivocal).   Follow up 5/24, stable NYHA III. TTR gene testing and cMRI arranged to evaluate for possible cardiac amyloidosis.   cMRI (6/24) showed LVEF 53%, RVEF 52%, no evidence for cardiac  amyloidosis. [facility] gene testing for hATTR was negative. Echo in 11/24 showed EF 55%, mild LVH, normal RV.   Echo 5/25 showed EF 60-65%, normal RV, mild MR.  Echo in 11/25 showed EF 55-60%, normal RV, IVC normal.   Today she returns for HF follow up.  She continues on Herceptin  long-term.  She is chronically short of breath if she walks long distances.  No chest pain.  No orthopnea/PND.  Weight down 1 lb.  Recent URI, otherwise has been feeling ok.   ECG (personally reviewed): NSR, left axis deviation.   Labs (5/24): K 4, creatinine 0.89 Labs (7/24): K 3.8, creatinine 0.85 Labs (11/24): K 3.3, creatinine 1.12 Labs (5/25): K 3.8, creatinine 1.16 Labs (1/26): K 3.6, creatinine 1.1  PMH: 1. HTN 2. Diabetes 3. Endometrial carcinoma: 8/17 diagnosis, had hysterectomy and BSO.  4. Hyperlipidemia 5. Breast cancer: Initial diagnosis in 2012.  Had left breast lumpectomy and radiation.  Recurrence in 7/20, ER+/PR-/HER2+.  She had chemotherapy with trastuzumab /pertuzumab /carboplatin /docetaxol x 6 starting 2/21.  With fall in EF in 4/21, Herceptin  was held but later restarted.  Lumpectomy 6/21.  - Echo (1/21): EF 60-65%, GLS -20%, normal RV.  - Echo (4/21): EF 45-50%, global hypokinesis, GLS -12.6%, mildly decreased RV function.  - Echo (6/21): EF 55-60%, GLS -14.4%, mild LVH, PASP 38, RV normal.  - Echo (7/21): EF 55-60%, mild LVH, GLS -16.9%, RV  Normal - Echo (9/21): EF 55-60%, grade 2 diastolic dysfunction, GLS -  11.6% (poor endocardial tracking), normal RV, normal IVC.  - Echo (2/22): EF 55%, GLS -10.7% (poor endocardial tracking), normal RV.  - Echo (4/22): EF 50-55%, mild LVH, normal RV, GLS -14.3% but poor endocardial tracing. - Echo (7/22): EF 55-60%, mild LVH, mild LAE, GLS -20.1%, normal RV, PASP 32 mmHg.  - Echo (10/22): EF 55-60%, mild LVH, GLS -20.1%, normal RV.  - Echo (5/23): EF 55-60% with GLS -22.1%, normal RV.  - Echo (11/23): EF 55% on my read (reported 50-55%), GLS -19.8%,  normal RV. - Echo (5/24): technically difficult study with inaccurate strain imaging due to poor endocardial tracing, EF 55%, normal RV, normal IVC.  - PYP scan (3/24): grade 2 with H/CL 1.08 (equivocal).  - cMRI (6/24) showed LVEF 53%, RVEF 52%, no evidence for cardiac amyloidosis - [facility] gene testing negative for hATTR. - Echo (11/24): EF 55%, mild LVH, normal RV.  - Echo 5/25: EF 60-65%, normal RV, mild MR - Echo (11/25): EF 55-60%, normal RV, IVC normal. 6. Coronary calcium  score scan (8/21): CAC 0 Agatston units.  - Coronary CTA (9/21): CAC 0 Agatston units.  No significant CAD.  7. DVT: Left leg, 8/21.  8. Cervical discectomy and fusion in 12/21.  9. Peripheral neuropathy 10. Carpal tunnel syndrome  Social History   Socioeconomic History   Marital status: Divorced    Spouse name: Not on file   Number of children: 0   Years of education: Not on file   Highest education level: Not on file  Occupational History   Occupation: Child psychotherapist    Employer: [employer]    Comment: retired   Occupation: RETIRED  Tobacco Use   Smoking status: Never   Smokeless tobacco: Never  Vaping Use   Vaping status: Never Used  Substance and Sexual Activity   Alcohol use: Not Currently    Comment: occasional on holidays   Drug use: Never   Sexual activity: Not Currently    Birth control/protection: Post-menopausal, Surgical  Other Topics Concern   Not on file  Social History Narrative   1 son deceased with homicide   Patient has been divorced twice   Right handed   Drinks caffeine   One story       Lives alone/2025   Social Drivers of Health   Tobacco Use: Low Risk (11/07/2024)   Patient History    Smoking Tobacco Use: Never    Smokeless Tobacco Use: Never    Passive Exposure: Not on file  Financial Resource Strain: Low Risk (06/15/2023)   Overall Financial Resource Strain (CARDIA)    Difficulty of Paying Living Expenses: Not hard at all  Food Insecurity: No Food  Insecurity (10/20/2024)   Epic    Worried About Radiation Protection Practitioner of Food in the Last Year: Never true    Ran Out of Food in the Last Year: Never true  Transportation Needs: No Transportation Needs (10/20/2024)   Epic    Lack of Transportation (Medical): No    Lack of Transportation (Non-Medical): No  Physical Activity: Inactive (10/20/2024)   Exercise Vital Sign    Days of Exercise per Week: 0 days    Minutes of Exercise per Session: 0 min  Stress: No Stress Concern Present (10/20/2024)   [employer] of Occupational Health - Occupational Stress Questionnaire    Feeling of Stress: Only a little  Social Connections: Moderately Integrated (10/20/2024)   Social Connection and Isolation Panel    Frequency of Communication with Friends and Family: More than  three times a week    Frequency of Social Gatherings with Friends and Family: Never    Attends Religious Services: More than 4 times per year    Active Member of Clubs or Organizations: No    Attends Engineer, Structural: More than 4 times per year    Marital Status: Divorced  Intimate Partner Violence: Patient Unable To Answer (10/20/2024)   Epic    Fear of Current or Ex-Partner: Patient unable to answer    Emotionally Abused: Patient unable to answer    Physically Abused: Patient unable to answer    Sexually Abused: Patient unable to answer  Depression (PHQ2-9): Low Risk (10/25/2024)   Depression (PHQ2-9)    PHQ-2 Score: 1  Alcohol Screen: Not on file  Housing: Unknown (10/20/2024)   Epic    Unable to Pay for Housing in the Last Year: No    Number of Times Moved in the Last Year: Not on file    Homeless in the Last Year: No  Utilities: Not At Risk (10/20/2024)   Epic    Threatened with loss of utilities: No  Health Literacy: Adequate Health Literacy (10/20/2024)   B1300 Health Literacy    Frequency of need for help with medical instructions: Never   Family History  Problem Relation Age of Onset   Diabetes Mother    Hypertension  Mother    Ovarian cancer Mother    Heart disease Father        COPD   Alcohol abuse Father        ETOH dependence   Breast cancer Maternal Aunt        dx in her 60s   Lung cancer Maternal Uncle    Breast cancer Paternal Aunt    Cancer Maternal Grandmother        salivary gland cancer   Colon cancer Neg Hx    Colon polyps Neg Hx    Esophageal cancer Neg Hx    Stomach cancer Neg Hx    Rectal cancer Neg Hx    ROS: All systems reviewed and negative except as per HPI.   Current Outpatient Medications  Medication Sig Dispense Refill   acetaminophen  (TYLENOL ) 500 MG tablet Take 500 mg by mouth every 6 (six) hours as needed for moderate pain (pain score 4-6).     benzonatate  (TESSALON ) 200 MG capsule Take 1 capsule (200 mg total) by mouth 3 (three) times daily as needed for cough. 30 capsule 0   carvedilol  (COREG ) 6.25 MG tablet TAKE 1 TABLET BY MOUTH TWICE A DAY 60 tablet 11   Cyanocobalamin  (B-12 PO) Take 1 capsule by mouth daily.     cyclobenzaprine  (FLEXERIL ) 5 MG tablet Take 1 tablet (5 mg total) by mouth at bedtime as needed for muscle spasms. 90 tablet 1   desloratadine  (CLARINEX ) 5 MG tablet Take 5 mg by mouth as needed.     diclofenac  Sodium (VOLTAREN ) 1 % GEL Apply 2 g topically 4 (four) times daily. Rub into affected area of foot 2 to 4 times daily 100 g 2   ketoconazole  (NIZORAL ) 2 % cream Apply 1 Application topically daily. 30 g 3   Magnesium  Oxide 400 MG CAPS Take 1 capsule (400 mg total) by mouth daily. 30 capsule 11   Multiple Vitamins-Minerals (MULTIVITAMIN WITH MINERALS) tablet Take 1 tablet by mouth daily.     nystatin  (MYCOSTATIN /NYSTOP ) powder Apply 1 Application topically 3 (three) times daily. 60 g 0   OZEMPIC, 0.25 OR 0.5 MG/DOSE,  2 MG/3ML SOPN Inject 0.25 mg into the skin once a week.     potassium chloride  SA (KLOR-CON  M) 20 MEQ tablet TAKE 1 TABLET BY MOUTH TWICE A DAY 60 tablet 8   telmisartan -hydrochlorothiazide  (MICARDIS  HCT) 80-12.5 MG tablet TAKE 1 TABLET  BY MOUTH EVERY DAY 90 tablet 3   Wheat Dextrin (BENEFIBER) POWD Take by mouth as needed.     XARELTO  20 MG TABS tablet TAKE 1 TABLET BY MOUTH DAILY WITH SUPPER 30 tablet 11   albuterol  (VENTOLIN  HFA) 108 (90 Base) MCG/ACT inhaler Inhale 2 puffs into the lungs every 6 (six) hours as needed for wheezing or shortness of breath. (Patient not taking: Reported on 11/07/2024) 8 g 2   furosemide  (LASIX ) 40 MG tablet Take 0.5 tablets (20 mg total) by mouth daily. 60 tablet 1   gabapentin  (NEURONTIN ) 300 MG capsule Take 2 capsules (600 mg total) by mouth at bedtime as needed (pain). (Patient not taking: Reported on 11/07/2024) 60 capsule 1   No current facility-administered medications for this encounter.   Facility-Administered Medications Ordered in Other Encounters  Medication Dose Route Frequency Provider Last Rate Last Admin   cloNIDine  (CATAPRES ) tablet 0.1 mg  0.1 mg Oral Daily Tanner, Van E., PA-C   0.1 mg at 11/21/19 1742   heparin  lock flush 100 unit/mL  500 Units Intracatheter Once Iruku, Praveena, MD       Wt Readings from Last 3 Encounters:  11/07/24 109.8 kg (242 lb)  10/25/24 106.9 kg (235 lb 12 oz)  10/23/24 108 kg (238 lb)   BP 108/60   Pulse 65   Wt 109.8 kg (242 lb)   LMP 05/18/2016   SpO2 97%   BMI 48.88 kg/m  General: NAD, obese.  Neck: No JVD, no thyromegaly or thyroid  nodule.  Lungs: Clear to auscultation bilaterally with normal respiratory effort. CV: Nondisplaced PMI.  Heart regular S1/S2, no S3/S4, no murmur.  No peripheral edema.  No carotid bruit.  Normal pedal pulses.  Abdomen: Soft, nontender, no hepatosplenomegaly, no distention.  Skin: Intact without lesions or rashes.  Neurologic: Alert and oriented x 3.  Psych: Normal affect. Extremities: No clubbing or cyanosis.  HEENT: Normal.   Assessment/Plan: 1. Chemotherapy-related cardiomyopathy: Fall in EF and strain in the setting of Herceptin  use.  Echo in 4/21 with EF 45-50% with GLS -12.6%.  Herceptin  was held,  Coreg  started, and echo in 6/21 showed EF up to 55-60% with GLS -14.4% => Herceptin  restarted.  Echo in 7/21 with EF 55-60%, GLS more negative at -16.9%.  Echo in 2/22 and in 9/21 showed normal EF but less negative strain.  However, strain images did not appear to track well on either echo.  Echo in 4/22  showed EF 50-55%, again with poor strain tracking.  Echo in 7/22 and in 10/22 showed EF 55-60% with GLS -20.1% and mild LVH. Echo in 5/23 showed EF 55-60% with GLS -22.1%, normal RV.  Echo 11/23 was read as 50-55% but looked like 55% on my read, with GLS -19.8% and normal RV. With painful peripheral neuropathy and carpal tunnel syndrome as well as LVH with diastolic CHF, there was concern for possible cardiac amyloidosis.  Urine and serum immunofixations negative.  PYP scan in 3/24 was equivocal.  TTR gene testing was negative. cMRI 6/24 showed LVEF 53% and no evidence of infiltrative process.  Echo in 11/24 showed EF 55%, mild LVH, normal RV.  Echo 5/25 showed EF 60-65%, normal RV, mild MR, echo in 11/25 showed  EF 55-60%, normal RV, IVC normal. NYHA class II symptoms, chronic pain and neuropathy are contributory.  She is not volume overloaded on exam.  - I will have her try decreasing Lasix  to 20 mg daily.  - Continue Coreg  6.25 mg bid.  - Continue telmisartan /hctz.   - She should be safe to continue Herceptin  (plan for long-term use).  With stable echoes and long-term Herceptin  use, we can continue to obtain echoes at 6 month intervals => will get the next echo in 7/26.  2. HTN: BP controlled.   - Continue current meds. 3. DVT: on left.   - Continue Xarelto  20 mg daily long-term given DVT in setting of malignancy.  4. Obesity: Difficult for her to exercise due to neuropathy, poor balance. Body mass index is 48.88 kg/m. - She is now on semaglutide.   Follow up in 1 year with APP.   I spent 31 minutes reviewing records, interviewing/examining patient, and managing orders.   Ezra Shuck   11/07/2024

## 2024-11-07 NOTE — Patient Instructions (Addendum)
 DECREASE lasix  to 20 mg daily  Your physician has requested that you have an echocardiogram. Echocardiography is a painless test that uses sound waves to create images of your heart. It provides your doctor with information about the size and shape of your heart and how well your hearts chambers and valves are working. This procedure takes approximately one hour. There are no restrictions for this procedure. Please do NOT wear cologne, perfume, aftershave, or lotions (deodorant is allowed). Please arrive 15 minutes prior to your appointment time.  Please note: We ask at that you not bring children with you during ultrasound (echo/ vascular) testing. Due to room size and safety concerns, children are not allowed in the ultrasound rooms during exams. Our front office staff cannot provide observation of children in our lobby area while testing is being conducted. An adult accompanying a patient to their appointment will only be allowed in the ultrasound room at the discretion of the ultrasound technician under special circumstances. We apologize for any inconvenience.  Your physician recommends that you schedule a follow-up appointment in: 1 yr ( jan 2027) Call office in November to schedule an appointment  If you have any questions or concerns before your next appointment please send us  a message through [place] or call our office at [phone].    TO LEAVE A MESSAGE FOR THE NURSE SELECT OPTION 2, PLEASE LEAVE A MESSAGE INCLUDING: YOUR NAME DATE OF BIRTH CALL BACK NUMBER REASON FOR CALL**this is important as we prioritize the call backs  YOU WILL RECEIVE A CALL BACK THE SAME DAY AS LONG AS YOU CALL BEFORE 4:00 PM At the [facility], you and your health needs are our priority. As part of our continuing mission to provide you with exceptional heart care, we have created designated Provider Care Teams. These Care Teams include your primary Cardiologist (physician) and Advanced  Practice Providers (APPs- Physician Assistants and Nurse Practitioners) who all work together to provide you with the care you need, when you need it.   You may see any of the following providers on your designated Care Team at your next follow up: Dr Toribio Fuel Dr Ezra Shuck Dr. Morene Brownie Greig Mosses, NP Caffie Shed, [place] [facility] [place], [place] Beckey Coe, NP Jordan Lee, NP Ellouise Class, NP Tinnie Redman, PharmD Jaun Bash, PharmD   Please be sure to bring in all your medications bottles to every appointment.    Thank you for choosing [facility]

## 2024-11-15 ENCOUNTER — Other Ambulatory Visit: Payer: Self-pay

## 2024-11-15 DIAGNOSIS — C50112 Malignant neoplasm of central portion of left female breast: Secondary | ICD-10-CM

## 2024-11-16 ENCOUNTER — Inpatient Hospital Stay

## 2024-11-16 ENCOUNTER — Inpatient Hospital Stay: Admitting: Hematology and Oncology

## 2024-11-16 VITALS — BP 142/58 | HR 84 | Temp 98.2°F | Resp 17 | Wt 237.1 lb

## 2024-11-16 DIAGNOSIS — C50812 Malignant neoplasm of overlapping sites of left female breast: Secondary | ICD-10-CM

## 2024-11-16 DIAGNOSIS — C50112 Malignant neoplasm of central portion of left female breast: Secondary | ICD-10-CM

## 2024-11-16 DIAGNOSIS — Z171 Estrogen receptor negative status [ER-]: Secondary | ICD-10-CM | POA: Diagnosis not present

## 2024-11-16 LAB — CBC WITH DIFFERENTIAL (CANCER CENTER ONLY)
Abs Immature Granulocytes: 0.03 10*3/uL (ref 0.00–0.07)
Basophils Absolute: 0 10*3/uL (ref 0.0–0.1)
Basophils Relative: 1 %
Eosinophils Absolute: 0.2 10*3/uL (ref 0.0–0.5)
Eosinophils Relative: 2 %
HCT: 31.4 % — ABNORMAL LOW (ref 36.0–46.0)
Hemoglobin: 10.2 g/dL — ABNORMAL LOW (ref 12.0–15.0)
Immature Granulocytes: 1 %
Lymphocytes Relative: 24 %
Lymphs Abs: 1.5 10*3/uL (ref 0.7–4.0)
MCH: 28.4 pg (ref 26.0–34.0)
MCHC: 32.5 g/dL (ref 30.0–36.0)
MCV: 87.5 fL (ref 80.0–100.0)
Monocytes Absolute: 0.8 10*3/uL (ref 0.1–1.0)
Monocytes Relative: 13 %
Neutro Abs: 3.8 10*3/uL (ref 1.7–7.7)
Neutrophils Relative %: 59 %
Platelet Count: 212 10*3/uL (ref 150–400)
RBC: 3.59 MIL/uL — ABNORMAL LOW (ref 3.87–5.11)
RDW: 15.1 % (ref 11.5–15.5)
WBC Count: 6.3 10*3/uL (ref 4.0–10.5)
nRBC: 0 % (ref 0.0–0.2)

## 2024-11-16 LAB — CMP (CANCER CENTER ONLY)
ALT: 16 U/L (ref 0–44)
AST: 22 U/L (ref 15–41)
Albumin: 4.1 g/dL (ref 3.5–5.0)
Alkaline Phosphatase: 47 U/L (ref 38–126)
Anion gap: 12 (ref 5–15)
BUN: 32 mg/dL — ABNORMAL HIGH (ref 8–23)
CO2: 25 mmol/L (ref 22–32)
Calcium: 9.6 mg/dL (ref 8.9–10.3)
Chloride: 102 mmol/L (ref 98–111)
Creatinine: 1.1 mg/dL — ABNORMAL HIGH (ref 0.44–1.00)
GFR, Estimated: 55 mL/min — ABNORMAL LOW
Glucose, Bld: 127 mg/dL — ABNORMAL HIGH (ref 70–99)
Potassium: 3.5 mmol/L (ref 3.5–5.1)
Sodium: 139 mmol/L (ref 135–145)
Total Bilirubin: 0.4 mg/dL (ref 0.0–1.2)
Total Protein: 8.1 g/dL (ref 6.5–8.1)

## 2024-11-16 MED ORDER — TRASTUZUMAB-ANNS CHEMO 150 MG IV SOLR
6.0000 mg/kg | Freq: Once | INTRAVENOUS | Status: AC
  Administered 2024-11-16: 600 mg via INTRAVENOUS
  Filled 2024-11-16: qty 28.57

## 2024-11-16 MED ORDER — ACETAMINOPHEN 325 MG PO TABS
650.0000 mg | ORAL_TABLET | Freq: Once | ORAL | Status: AC
  Administered 2024-11-16: 650 mg via ORAL
  Filled 2024-11-16: qty 2

## 2024-11-16 MED ORDER — SODIUM CHLORIDE 0.9 % IV SOLN: Freq: Once | INTRAVENOUS | Status: AC

## 2024-11-16 MED ORDER — DIPHENHYDRAMINE HCL 25 MG PO CAPS
25.0000 mg | ORAL_CAPSULE | Freq: Once | ORAL | Status: AC
  Administered 2024-11-16: 25 mg via ORAL
  Filled 2024-11-16: qty 1

## 2024-11-16 NOTE — Progress Notes (Signed)
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 No Stress Concern Present (10/20/2024)   [employer] - Occupational Stress Questionnaire    Feeling of Stress: Only a little  Social Connections: Moderately Integrated (10/20/2024)   Social Connection and Isolation Panel    Frequency of Communication with Friends and Family: More than three times a week    Frequency of Social Gatherings with Friends and Family: Never    Attends Religious Services: More than 4 times per year    Active Member of [employer] or Organizations: No    Attends Engineer, Structural: More than 4 times per year    Marital Status: Divorced  Intimate Partner Violence: Patient Unable To Answer (10/20/2024)   Epic    Fear of Current or Ex-Partner: Patient unable to answer    Emotionally Abused: Patient unable to answer    Physically Abused: Patient unable to answer    Sexually Abused: Patient unable to answer  Depression (PHQ2-9): Low Risk (10/25/2024)   Depression (PHQ2-9)    PHQ-2 Score: 1  Alcohol Screen: Not on file  Housing: Unknown (10/20/2024)   Epic     Unable to Pay for Housing in the Last Year: No    Number of Times Moved in the Last Year: Not on file    Homeless in the Last Year: No  Utilities: Not At Risk (10/20/2024)   Epic    Threatened with loss of utilities: No  Health Literacy: Adequate Health Literacy (10/20/2024)   B1300 Health Literacy    Frequency of need for help with medical instructions: Never    FAMILY HISTORY: Family History  Problem Relation Age of Onset   Diabetes Mother    Hypertension Mother    Ovarian cancer Mother    Heart disease Father        COPD   Alcohol abuse Father        ETOH dependence   Breast cancer Maternal Aunt        dx in her 64s   Lung cancer Maternal Uncle    Breast cancer Paternal Aunt    Cancer Maternal Grandmother        salivary gland cancer   Colon cancer Neg Hx    Colon polyps Neg Hx    Esophageal cancer Neg Hx    Stomach cancer Neg Hx    Rectal cancer Neg Hx     Review of Systems  Constitutional:  Negative for appetite change, chills, fatigue, fever and unexpected weight change.  HENT:   Negative for hearing loss, lump/mass and trouble swallowing.   Eyes:  Negative for eye problems and icterus.  Respiratory:  Negative for chest tightness, cough and shortness of breath.   Cardiovascular:  Negative for chest pain, leg swelling and palpitations.  Gastrointestinal:  Negative for abdominal distention, abdominal pain, constipation, diarrhea, nausea and vomiting.  Endocrine: Negative for hot flashes.  Genitourinary:  Negative for difficulty urinating.   Musculoskeletal:  Negative for arthralgias.  Skin:  Negative for itching and rash.  Neurological:  Negative for dizziness, extremity weakness, headaches and numbness.  Hematological:  Negative for adenopathy. Does not bruise/bleed easily.  Psychiatric/Behavioral:  Negative for depression. The patient is not nervous/anxious.       PHYSICAL EXAMINATION  LMP 05/18/2016    Physical Exam Constitutional:      General: She is not  in acute distress.    Appearance: Normal appearance. She is not toxic-appearing.  HENT:     Head: Normocephalic and atraumatic.     Mouth/Throat:     Mouth: Mucous membranes are moist.     Pharynx: Oropharynx is clear. No oropharyngeal exudate or posterior oropharyngeal erythema.  Eyes:     General: No scleral icterus. Cardiovascular:     Rate and Rhythm: Normal rate and regular rhythm.     Pulses: Normal pulses.     Heart sounds: Normal heart sounds.  Pulmonary:     Effort: Pulmonary effort is normal.     Breath sounds: No wheezing.  Abdominal:     General: Abdomen is flat. Bowel sounds are normal. There is no distension.     Palpations: Abdomen is soft.     Tenderness: There is no abdominal tenderness.  Musculoskeletal:        General: Swelling (BLE noted) present.     Cervical back: Neck supple.  Lymphadenopathy:     Cervical: No cervical adenopathy.  Skin:    General: Skin is warm and dry.     Findings: No rash.  Neurological:     General: No focal deficit present.     Mental Status: She is alert.     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        Behavior: Behavior normal.     LABORATORY DATA:  CBC    Component Value Date/Time   WBC 6.3 11/16/2024 0817   WBC 6.4 09/27/2020 1114   RBC 3.59 (L) 11/16/2024 0817   HGB 10.2 (L) 11/16/2024 0817   HGB 11.9 03/30/2017 1044   HCT 31.4 (L) 11/16/2024 0817   HCT 37.4 03/30/2017 1044   PLT 212 11/16/2024 0817   PLT 238 03/30/2017 1044   MCV 87.5 11/16/2024 0817   MCV 85.0 03/30/2017 1044   MCH 28.4 11/16/2024 0817   MCHC 32.5 11/16/2024 0817   RDW 15.1 11/16/2024 0817   RDW 15.1 (H) 03/30/2017 1044   LYMPHSABS PENDING 11/16/2024 0817   LYMPHSABS 1.3 03/30/2017 1044   MONOABS PENDING 11/16/2024 0817   MONOABS 0.5 03/30/2017 1044   EOSABS PENDING 11/16/2024 0817   EOSABS 0.0 03/30/2017 1044   BASOSABS PENDING  11/16/2024 0817   BASOSABS 0.0 03/30/2017 1044    CMP     Component Value Date/Time   NA 141 10/25/2024 1313   NA 143 03/30/2017 1044   K 3.6 10/25/2024 1313   K 3.8 03/30/2017 1044   CL 104 10/25/2024 1313   CL 106 01/18/2013 0909   CO2 26 10/25/2024 1313   CO2 27 03/30/2017 1044   GLUCOSE 117 (H) 10/25/2024  1313   GLUCOSE 91 03/30/2017 1044   GLUCOSE 99 01/18/2013 0909   BUN 26 (H) 10/25/2024 1313   BUN 9.7 03/30/2017 1044   CREATININE 1.10 (H) 10/25/2024 1313   CREATININE 0.8 03/30/2017 1044   CALCIUM  10.0 10/25/2024 1313   CALCIUM  9.7 03/30/2017 1044   PROT 8.2 (H) 10/25/2024 1313   PROT 8.0 03/30/2017 1044   ALBUMIN 4.0 10/25/2024 1313   ALBUMIN 3.8 01/27/2021 1232   ALBUMIN 3.4 (L) 03/30/2017 1044   AST 23 10/25/2024 1313   AST 17 03/30/2017 1044   ALT 19 10/25/2024 1313   ALT 15 03/30/2017 1044   ALKPHOS 47 10/25/2024 1313   ALKPHOS 60 03/30/2017 1044   BILITOT 0.4 10/25/2024 1313   BILITOT 0.34 03/30/2017 1044   GFRNONAA 55 (L) 10/25/2024 1313   GFRAA >60 07/11/2020 0923    ASSESSMENT and THERAPY PLAN:   Assessment and Plan Assessment & Plan Metastatic Breast cancer on maintenance herceptin  therapy Herceptin  therapy ongoing. PET scan every six months for monitoring.  This is scheduled for Dec 01 2024. No symptoms of recurrence.  - Continue PET scans every six months and routine oncology surveillance. - Monitor for recurrence symptoms. - Schedule follow-up next month for PET scan review. - Reassured regarding muscle cramping and absence of axillary lymphadenopathy.  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  - Recommended monitoring for symptom changes.  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  - Continue monitoring hemoglobin levels. - No indication for transfusion  Type 2 diabetes mellitus May contribute to peripheral neuropathy.  On Ozempic 1 mg with minimal weight loss.    All questions were answered. The patient knows to call the clinic with any problems, questions or concerns. We can certainly see the patient much sooner if necessary.  Total encounter time:30 minutes*in face-to-face visit time, chart review, lab review, care coordination, order entry, and documentation of the encounter time.   *Total Encounter Time as defined by the Centers for Medicare and Medicaid Services includes, in addition to the face-to-face time of a patient visit (documented in the note above) non-face-to-face time: obtaining and reviewing outside history, ordering and reviewing medications, tests or procedures, care coordination (communications with other health care professionals or caregivers) and documentation in the medical record.

## 2024-12-01 ENCOUNTER — Encounter (HOSPITAL_COMMUNITY)

## 2024-12-06 ENCOUNTER — Inpatient Hospital Stay: Attending: Hematology and Oncology

## 2024-12-06 ENCOUNTER — Inpatient Hospital Stay

## 2025-01-18 ENCOUNTER — Ambulatory Visit: Admitting: Podiatry

## 2025-04-03 ENCOUNTER — Ambulatory Visit: Admitting: Neurology
